# Patient Record
Sex: Female | Born: 1935
Health system: Southern US, Community
[De-identification: ages and names within clinical notes are randomized; demographics above are authoritative.]

## PROBLEM LIST (undated history)

## (undated) ENCOUNTER — Ambulatory Visit: Admission: EM | Payer: Medicare Other

## (undated) DIAGNOSIS — I5032 Chronic diastolic (congestive) heart failure: Secondary | ICD-10-CM

## (undated) DIAGNOSIS — I779 Disorder of arteries and arterioles, unspecified: Secondary | ICD-10-CM

## (undated) DIAGNOSIS — I25119 Atherosclerotic heart disease of native coronary artery with unspecified angina pectoris: Secondary | ICD-10-CM

## (undated) DIAGNOSIS — G629 Polyneuropathy, unspecified: Secondary | ICD-10-CM

## (undated) DIAGNOSIS — E1151 Type 2 diabetes mellitus with diabetic peripheral angiopathy without gangrene: Secondary | ICD-10-CM

## (undated) DIAGNOSIS — R001 Bradycardia, unspecified: Secondary | ICD-10-CM

## (undated) DIAGNOSIS — I10 Essential (primary) hypertension: Secondary | ICD-10-CM

## (undated) DIAGNOSIS — K219 Gastro-esophageal reflux disease without esophagitis: Secondary | ICD-10-CM

## (undated) DIAGNOSIS — K279 Peptic ulcer, site unspecified, unspecified as acute or chronic, without hemorrhage or perforation: Secondary | ICD-10-CM

## (undated) DIAGNOSIS — I739 Peripheral vascular disease, unspecified: Secondary | ICD-10-CM

## (undated) DIAGNOSIS — Z951 Presence of aortocoronary bypass graft: Secondary | ICD-10-CM

## (undated) DIAGNOSIS — I253 Aneurysm of heart: Secondary | ICD-10-CM

## (undated) DIAGNOSIS — T50995A Adverse effect of other drugs, medicaments and biological substances, initial encounter: Secondary | ICD-10-CM

## (undated) DIAGNOSIS — D649 Anemia, unspecified: Secondary | ICD-10-CM

## (undated) DIAGNOSIS — M069 Rheumatoid arthritis, unspecified: Secondary | ICD-10-CM

## (undated) DIAGNOSIS — E785 Hyperlipidemia, unspecified: Secondary | ICD-10-CM

## (undated) HISTORY — DX: Hyperlipidemia, unspecified: E78.5

## (undated) HISTORY — PX: BACK SURGERY: SHX140

## (undated) HISTORY — DX: Presence of aortocoronary bypass graft: Z95.1

## (undated) HISTORY — DX: Peripheral vascular disease, unspecified: I73.9

## (undated) HISTORY — DX: Chronic diastolic (congestive) heart failure: I50.32

## (undated) HISTORY — DX: Disorder of arteries and arterioles, unspecified: I77.9

## (undated) HISTORY — DX: Rheumatoid arthritis, unspecified: M06.9

## (undated) HISTORY — DX: Polyneuropathy, unspecified: G62.9

## (undated) HISTORY — DX: Peptic ulcer, site unspecified, unspecified as acute or chronic, without hemorrhage or perforation: K27.9

## (undated) HISTORY — DX: Type 2 diabetes mellitus with diabetic peripheral angiopathy without gangrene: E11.51

## (undated) HISTORY — DX: Anemia, unspecified: D64.9

## (undated) HISTORY — PX: TUBAL LIGATION: SHX77

## (undated) HISTORY — DX: Essential (primary) hypertension: I10

## (undated) HISTORY — DX: Atherosclerotic heart disease of native coronary artery with unspecified angina pectoris: I25.119

## (undated) HISTORY — PX: TRANSTHORACIC ECHOCARDIOGRAM: SHX275

## (undated) HISTORY — PX: NM MYOVIEW LTD: HXRAD82

---

## 1990-12-13 DIAGNOSIS — I25119 Atherosclerotic heart disease of native coronary artery with unspecified angina pectoris: Secondary | ICD-10-CM

## 1990-12-13 HISTORY — PX: CORONARY ANGIOPLASTY: SHX604

## 1990-12-13 HISTORY — DX: Atherosclerotic heart disease of native coronary artery with unspecified angina pectoris: I25.119

## 1996-12-13 DIAGNOSIS — Z951 Presence of aortocoronary bypass graft: Secondary | ICD-10-CM

## 1996-12-13 HISTORY — DX: Presence of aortocoronary bypass graft: Z95.1

## 1996-12-13 HISTORY — PX: CORONARY ARTERY BYPASS GRAFT: SHX141

## 1998-03-13 ENCOUNTER — Encounter (HOSPITAL_COMMUNITY): Admission: RE | Admit: 1998-03-13 | Discharge: 1998-06-11 | Payer: Self-pay | Admitting: Cardiology

## 1998-07-17 ENCOUNTER — Encounter: Admission: RE | Admit: 1998-07-17 | Discharge: 1998-10-15 | Payer: Self-pay | Admitting: Specialist

## 1998-10-01 ENCOUNTER — Other Ambulatory Visit: Admission: RE | Admit: 1998-10-01 | Discharge: 1998-10-01 | Payer: Self-pay | Admitting: Endocrinology

## 1999-07-22 ENCOUNTER — Emergency Department (HOSPITAL_COMMUNITY): Admission: EM | Admit: 1999-07-22 | Discharge: 1999-07-22 | Payer: Self-pay | Admitting: Emergency Medicine

## 1999-09-09 ENCOUNTER — Encounter: Admission: RE | Admit: 1999-09-09 | Discharge: 1999-12-08 | Payer: Self-pay | Admitting: Internal Medicine

## 1999-11-03 ENCOUNTER — Other Ambulatory Visit: Admission: RE | Admit: 1999-11-03 | Discharge: 1999-11-03 | Payer: Self-pay | Admitting: Endocrinology

## 1999-12-10 ENCOUNTER — Encounter (INDEPENDENT_AMBULATORY_CARE_PROVIDER_SITE_OTHER): Payer: Self-pay | Admitting: *Deleted

## 1999-12-10 ENCOUNTER — Ambulatory Visit (HOSPITAL_COMMUNITY): Admission: RE | Admit: 1999-12-10 | Discharge: 1999-12-10 | Payer: Self-pay | Admitting: *Deleted

## 2000-02-05 ENCOUNTER — Encounter: Payer: Self-pay | Admitting: Specialist

## 2000-02-05 ENCOUNTER — Ambulatory Visit (HOSPITAL_COMMUNITY): Admission: RE | Admit: 2000-02-05 | Discharge: 2000-02-05 | Payer: Self-pay | Admitting: Specialist

## 2000-02-19 ENCOUNTER — Ambulatory Visit (HOSPITAL_COMMUNITY): Admission: RE | Admit: 2000-02-19 | Discharge: 2000-02-19 | Payer: Self-pay | Admitting: Specialist

## 2000-02-19 ENCOUNTER — Encounter: Payer: Self-pay | Admitting: Specialist

## 2000-03-08 ENCOUNTER — Encounter: Payer: Self-pay | Admitting: Specialist

## 2000-03-08 ENCOUNTER — Ambulatory Visit (HOSPITAL_COMMUNITY): Admission: RE | Admit: 2000-03-08 | Discharge: 2000-03-08 | Payer: Self-pay | Admitting: Specialist

## 2000-04-08 ENCOUNTER — Encounter: Admission: RE | Admit: 2000-04-08 | Discharge: 2000-05-24 | Payer: Self-pay | Admitting: Specialist

## 2000-06-16 ENCOUNTER — Encounter: Payer: Self-pay | Admitting: Endocrinology

## 2000-06-16 ENCOUNTER — Encounter: Admission: RE | Admit: 2000-06-16 | Discharge: 2000-06-16 | Payer: Self-pay | Admitting: Endocrinology

## 2000-09-21 ENCOUNTER — Encounter: Payer: Self-pay | Admitting: Endocrinology

## 2000-09-21 ENCOUNTER — Ambulatory Visit (HOSPITAL_COMMUNITY): Admission: RE | Admit: 2000-09-21 | Discharge: 2000-09-21 | Payer: Self-pay | Admitting: Endocrinology

## 2000-10-14 ENCOUNTER — Ambulatory Visit (HOSPITAL_COMMUNITY): Admission: RE | Admit: 2000-10-14 | Discharge: 2000-10-14 | Payer: Self-pay | Admitting: *Deleted

## 2000-10-14 ENCOUNTER — Encounter (INDEPENDENT_AMBULATORY_CARE_PROVIDER_SITE_OTHER): Payer: Self-pay | Admitting: Specialist

## 2000-10-29 ENCOUNTER — Emergency Department (HOSPITAL_COMMUNITY): Admission: EM | Admit: 2000-10-29 | Discharge: 2000-10-29 | Payer: Self-pay | Admitting: Emergency Medicine

## 2000-10-29 ENCOUNTER — Encounter: Payer: Self-pay | Admitting: Emergency Medicine

## 2000-11-07 ENCOUNTER — Encounter: Admission: RE | Admit: 2000-11-07 | Discharge: 2001-02-05 | Payer: Self-pay | Admitting: Cardiology

## 2000-12-02 ENCOUNTER — Ambulatory Visit (HOSPITAL_COMMUNITY): Admission: RE | Admit: 2000-12-02 | Discharge: 2000-12-02 | Payer: Self-pay | Admitting: *Deleted

## 2000-12-02 ENCOUNTER — Encounter: Payer: Self-pay | Admitting: *Deleted

## 2000-12-19 ENCOUNTER — Ambulatory Visit (HOSPITAL_COMMUNITY): Admission: RE | Admit: 2000-12-19 | Discharge: 2000-12-19 | Payer: Self-pay | Admitting: Endocrinology

## 2000-12-27 ENCOUNTER — Emergency Department (HOSPITAL_COMMUNITY): Admission: EM | Admit: 2000-12-27 | Discharge: 2000-12-27 | Payer: Self-pay | Admitting: Emergency Medicine

## 2000-12-30 ENCOUNTER — Ambulatory Visit (HOSPITAL_COMMUNITY): Admission: RE | Admit: 2000-12-30 | Discharge: 2000-12-30 | Payer: Self-pay | Admitting: Endocrinology

## 2001-01-09 ENCOUNTER — Encounter: Payer: Self-pay | Admitting: Endocrinology

## 2001-01-09 ENCOUNTER — Ambulatory Visit (HOSPITAL_COMMUNITY): Admission: RE | Admit: 2001-01-09 | Discharge: 2001-01-09 | Payer: Self-pay | Admitting: Endocrinology

## 2001-01-31 ENCOUNTER — Ambulatory Visit (HOSPITAL_COMMUNITY): Admission: RE | Admit: 2001-01-31 | Discharge: 2001-01-31 | Payer: Self-pay | Admitting: Specialist

## 2001-07-17 ENCOUNTER — Ambulatory Visit (HOSPITAL_COMMUNITY): Admission: RE | Admit: 2001-07-17 | Discharge: 2001-07-17 | Payer: Self-pay | Admitting: Cardiology

## 2001-07-17 HISTORY — PX: CARDIAC CATHETERIZATION: SHX172

## 2001-07-31 ENCOUNTER — Emergency Department (HOSPITAL_COMMUNITY): Admission: EM | Admit: 2001-07-31 | Discharge: 2001-07-31 | Payer: Self-pay | Admitting: Emergency Medicine

## 2001-08-28 ENCOUNTER — Encounter: Admission: RE | Admit: 2001-08-28 | Discharge: 2001-08-28 | Payer: Self-pay | Admitting: Neurosurgery

## 2001-08-30 ENCOUNTER — Encounter: Admission: RE | Admit: 2001-08-30 | Discharge: 2001-08-30 | Payer: Self-pay | Admitting: Neurosurgery

## 2001-08-30 ENCOUNTER — Encounter: Payer: Self-pay | Admitting: Neurosurgery

## 2002-01-31 ENCOUNTER — Encounter: Payer: Self-pay | Admitting: Specialist

## 2002-02-07 ENCOUNTER — Encounter: Payer: Self-pay | Admitting: Specialist

## 2002-02-07 ENCOUNTER — Inpatient Hospital Stay (HOSPITAL_COMMUNITY): Admission: RE | Admit: 2002-02-07 | Discharge: 2002-02-12 | Payer: Self-pay | Admitting: Specialist

## 2002-02-10 ENCOUNTER — Encounter: Payer: Self-pay | Admitting: Specialist

## 2002-02-12 ENCOUNTER — Inpatient Hospital Stay (HOSPITAL_COMMUNITY)
Admission: RE | Admit: 2002-02-12 | Discharge: 2002-02-21 | Payer: Self-pay | Admitting: Physical Medicine & Rehabilitation

## 2002-08-03 ENCOUNTER — Encounter: Payer: Self-pay | Admitting: Endocrinology

## 2002-08-03 ENCOUNTER — Encounter: Admission: RE | Admit: 2002-08-03 | Discharge: 2002-08-03 | Payer: Self-pay | Admitting: Endocrinology

## 2002-08-22 ENCOUNTER — Encounter: Payer: Self-pay | Admitting: Endocrinology

## 2002-08-22 ENCOUNTER — Ambulatory Visit (HOSPITAL_COMMUNITY): Admission: RE | Admit: 2002-08-22 | Discharge: 2002-08-22 | Payer: Self-pay | Admitting: Endocrinology

## 2004-01-02 ENCOUNTER — Ambulatory Visit (HOSPITAL_COMMUNITY): Admission: RE | Admit: 2004-01-02 | Discharge: 2004-01-02 | Payer: Self-pay | Admitting: Cardiology

## 2004-04-09 ENCOUNTER — Emergency Department (HOSPITAL_COMMUNITY): Admission: EM | Admit: 2004-04-09 | Discharge: 2004-04-09 | Payer: Self-pay

## 2004-04-09 ENCOUNTER — Encounter: Admission: RE | Admit: 2004-04-09 | Discharge: 2004-04-09 | Payer: Self-pay | Admitting: Endocrinology

## 2004-10-02 ENCOUNTER — Encounter: Admission: RE | Admit: 2004-10-02 | Discharge: 2004-10-02 | Payer: Self-pay | Admitting: Neurology

## 2004-10-27 ENCOUNTER — Encounter (INDEPENDENT_AMBULATORY_CARE_PROVIDER_SITE_OTHER): Payer: Self-pay | Admitting: *Deleted

## 2004-10-27 ENCOUNTER — Ambulatory Visit (HOSPITAL_COMMUNITY): Admission: RE | Admit: 2004-10-27 | Discharge: 2004-10-27 | Payer: Self-pay | Admitting: Otolaryngology

## 2005-06-25 ENCOUNTER — Ambulatory Visit (HOSPITAL_COMMUNITY): Admission: RE | Admit: 2005-06-25 | Discharge: 2005-06-25 | Payer: Self-pay | Admitting: *Deleted

## 2005-06-25 ENCOUNTER — Encounter (INDEPENDENT_AMBULATORY_CARE_PROVIDER_SITE_OTHER): Payer: Self-pay | Admitting: *Deleted

## 2005-06-25 ENCOUNTER — Encounter (INDEPENDENT_AMBULATORY_CARE_PROVIDER_SITE_OTHER): Payer: Self-pay | Admitting: Specialist

## 2005-12-25 ENCOUNTER — Emergency Department (HOSPITAL_COMMUNITY): Admission: EM | Admit: 2005-12-25 | Discharge: 2005-12-25 | Payer: Self-pay | Admitting: Emergency Medicine

## 2006-03-16 ENCOUNTER — Emergency Department (HOSPITAL_COMMUNITY): Admission: EM | Admit: 2006-03-16 | Discharge: 2006-03-16 | Payer: Self-pay | Admitting: Emergency Medicine

## 2006-09-25 ENCOUNTER — Encounter: Admission: RE | Admit: 2006-09-25 | Discharge: 2006-09-25 | Payer: Self-pay | Admitting: Endocrinology

## 2007-09-04 ENCOUNTER — Encounter: Admission: RE | Admit: 2007-09-04 | Discharge: 2007-09-04 | Payer: Self-pay | Admitting: Endocrinology

## 2007-09-04 ENCOUNTER — Encounter (INDEPENDENT_AMBULATORY_CARE_PROVIDER_SITE_OTHER): Payer: Self-pay | Admitting: *Deleted

## 2007-09-27 ENCOUNTER — Encounter (INDEPENDENT_AMBULATORY_CARE_PROVIDER_SITE_OTHER): Payer: Self-pay | Admitting: *Deleted

## 2007-09-27 ENCOUNTER — Ambulatory Visit (HOSPITAL_COMMUNITY): Admission: RE | Admit: 2007-09-27 | Discharge: 2007-09-27 | Payer: Self-pay | Admitting: *Deleted

## 2007-12-08 ENCOUNTER — Encounter (INDEPENDENT_AMBULATORY_CARE_PROVIDER_SITE_OTHER): Payer: Self-pay | Admitting: *Deleted

## 2007-12-08 ENCOUNTER — Encounter: Admission: RE | Admit: 2007-12-08 | Discharge: 2007-12-08 | Payer: Self-pay | Admitting: Endocrinology

## 2008-07-16 ENCOUNTER — Emergency Department (HOSPITAL_COMMUNITY): Admission: EM | Admit: 2008-07-16 | Discharge: 2008-07-16 | Payer: Self-pay | Admitting: Emergency Medicine

## 2008-12-20 ENCOUNTER — Ambulatory Visit (HOSPITAL_COMMUNITY): Admission: RE | Admit: 2008-12-20 | Discharge: 2008-12-20 | Payer: Self-pay | Admitting: *Deleted

## 2008-12-20 ENCOUNTER — Encounter (INDEPENDENT_AMBULATORY_CARE_PROVIDER_SITE_OTHER): Payer: Self-pay | Admitting: *Deleted

## 2009-02-15 ENCOUNTER — Emergency Department (HOSPITAL_COMMUNITY): Admission: EM | Admit: 2009-02-15 | Discharge: 2009-02-15 | Payer: Self-pay | Admitting: Emergency Medicine

## 2009-02-21 ENCOUNTER — Encounter: Admission: RE | Admit: 2009-02-21 | Discharge: 2009-02-21 | Payer: Self-pay | Admitting: Rheumatology

## 2009-03-28 ENCOUNTER — Encounter (INDEPENDENT_AMBULATORY_CARE_PROVIDER_SITE_OTHER): Payer: Self-pay | Admitting: *Deleted

## 2009-03-28 ENCOUNTER — Ambulatory Visit (HOSPITAL_COMMUNITY): Admission: RE | Admit: 2009-03-28 | Discharge: 2009-03-28 | Payer: Self-pay | Admitting: *Deleted

## 2009-06-06 ENCOUNTER — Ambulatory Visit (HOSPITAL_COMMUNITY): Admission: RE | Admit: 2009-06-06 | Discharge: 2009-06-06 | Payer: Self-pay | Admitting: Orthopedic Surgery

## 2009-10-09 ENCOUNTER — Encounter: Payer: Self-pay | Admitting: Gastroenterology

## 2009-12-18 ENCOUNTER — Encounter: Payer: Self-pay | Admitting: Gastroenterology

## 2009-12-22 ENCOUNTER — Encounter: Payer: Self-pay | Admitting: Gastroenterology

## 2009-12-22 ENCOUNTER — Encounter (INDEPENDENT_AMBULATORY_CARE_PROVIDER_SITE_OTHER): Payer: Self-pay | Admitting: *Deleted

## 2010-01-14 ENCOUNTER — Ambulatory Visit: Payer: Self-pay | Admitting: Gastroenterology

## 2010-01-14 DIAGNOSIS — Z860100 Personal history of colon polyps, unspecified: Secondary | ICD-10-CM | POA: Insufficient documentation

## 2010-01-14 DIAGNOSIS — Z91013 Allergy to seafood: Secondary | ICD-10-CM

## 2010-01-14 DIAGNOSIS — D638 Anemia in other chronic diseases classified elsewhere: Secondary | ICD-10-CM | POA: Insufficient documentation

## 2010-01-14 DIAGNOSIS — I25119 Atherosclerotic heart disease of native coronary artery with unspecified angina pectoris: Secondary | ICD-10-CM | POA: Insufficient documentation

## 2010-01-14 DIAGNOSIS — E119 Type 2 diabetes mellitus without complications: Secondary | ICD-10-CM | POA: Insufficient documentation

## 2010-01-14 DIAGNOSIS — I2510 Atherosclerotic heart disease of native coronary artery without angina pectoris: Secondary | ICD-10-CM | POA: Insufficient documentation

## 2010-01-14 DIAGNOSIS — K5730 Diverticulosis of large intestine without perforation or abscess without bleeding: Secondary | ICD-10-CM | POA: Insufficient documentation

## 2010-01-14 HISTORY — DX: Diverticulosis of large intestine without perforation or abscess without bleeding: K57.30

## 2010-01-14 HISTORY — DX: Atherosclerotic heart disease of native coronary artery with unspecified angina pectoris: I25.119

## 2010-01-14 HISTORY — DX: Allergy to seafood: Z91.013

## 2010-01-16 ENCOUNTER — Ambulatory Visit: Payer: Self-pay | Admitting: Gastroenterology

## 2010-01-20 ENCOUNTER — Encounter: Payer: Self-pay | Admitting: Gastroenterology

## 2010-02-04 ENCOUNTER — Ambulatory Visit: Payer: Self-pay | Admitting: Gastroenterology

## 2010-02-04 LAB — CONVERTED CEMR LAB
Fecal Occult Blood: NEGATIVE
OCCULT 1: NEGATIVE
OCCULT 2: NEGATIVE
OCCULT 3: NEGATIVE
OCCULT 4: NEGATIVE
OCCULT 5: NEGATIVE

## 2010-02-05 ENCOUNTER — Encounter: Payer: Self-pay | Admitting: Gastroenterology

## 2010-03-03 ENCOUNTER — Telehealth: Payer: Self-pay | Admitting: Gastroenterology

## 2010-03-04 ENCOUNTER — Ambulatory Visit: Payer: Self-pay | Admitting: Gastroenterology

## 2010-03-04 DIAGNOSIS — K2270 Barrett's esophagus without dysplasia: Secondary | ICD-10-CM

## 2010-03-04 DIAGNOSIS — I6350 Cerebral infarction due to unspecified occlusion or stenosis of unspecified cerebral artery: Secondary | ICD-10-CM | POA: Insufficient documentation

## 2010-03-04 DIAGNOSIS — R159 Full incontinence of feces: Secondary | ICD-10-CM | POA: Insufficient documentation

## 2010-03-04 HISTORY — DX: Barrett's esophagus without dysplasia: K22.70

## 2010-04-13 ENCOUNTER — Ambulatory Visit: Payer: Self-pay | Admitting: Gastroenterology

## 2010-04-17 ENCOUNTER — Emergency Department (HOSPITAL_COMMUNITY): Admission: EM | Admit: 2010-04-17 | Discharge: 2010-04-18 | Payer: Self-pay | Admitting: Emergency Medicine

## 2010-10-07 ENCOUNTER — Ambulatory Visit: Payer: Self-pay | Admitting: Gastroenterology

## 2010-10-07 DIAGNOSIS — R1031 Right lower quadrant pain: Secondary | ICD-10-CM | POA: Insufficient documentation

## 2010-10-07 LAB — CONVERTED CEMR LAB
Bilirubin Urine: NEGATIVE
Hemoglobin, Urine: NEGATIVE
Ketones, ur: NEGATIVE mg/dL
Leukocytes, UA: NEGATIVE
Nitrite: NEGATIVE
Specific Gravity, Urine: >=1.03 (ref 1.000–1.030)
Total Protein, Urine: NEGATIVE mg/dL
Urine Glucose: NEGATIVE mg/dL
Urobilinogen, UA: 0.2 (ref 0.0–1.0)
pH: 5.5 (ref 5.0–8.0)

## 2010-11-09 ENCOUNTER — Ambulatory Visit: Payer: Self-pay | Admitting: Gastroenterology

## 2010-12-28 ENCOUNTER — Ambulatory Visit
Admission: RE | Admit: 2010-12-28 | Discharge: 2010-12-28 | Payer: Self-pay | Source: Home / Self Care | Attending: Gastroenterology | Admitting: Gastroenterology

## 2011-01-14 NOTE — Assessment & Plan Note (Signed)
Summary: F/U Rectal pain/Incontinence, saw PA   History of Present Illness Visit Type: Follow-up Visit Primary GI MD: Melvia Heaps MD [facility] Primary Provider: Darci Needle, MD Chief Complaint: follow-up rectal pain/incontinence History of Present Illness:   Mrs. Demarinis has returned for followup of her stool incontinence.  On a regimen of anusol HC suppositories and fiber supplementation her symptoms are significantly improved.  Incontinence is rare.  Rectal soreness has improved though it remains.   GI Review of Systems    Reports acid reflux and  belching.      Denies abdominal pain, bloating, chest pain, dysphagia with liquids, dysphagia with solids, heartburn, loss of appetite, nausea, vomiting, vomiting blood, weight loss, and  weight gain.      Reports change in bowel habits, diarrhea, fecal incontinence, and  rectal pain.     Denies anal fissure, black tarry stools, constipation, diverticulosis, heme positive stool, hemorrhoids, irritable bowel syndrome, jaundice, light color stool, liver problems, and  rectal bleeding.    Current Medications (verified): 1)  Zetia 10 Mg Tabs (Ezetimibe) .... Take 1 Tablet By Mouth Once A Day 2)  Metoprolol Tartrate 100 Mg Tabs (Metoprolol Tartrate) .... Take 1 Tablet By Mouth Once A Day 3)  Estradiol 1 Mg Tabs (Estradiol) .... Take 1 Tablet By Mouth Two Times A Day 4)  Exforge 5-320 Mg Tabs (Amlodipine Besylate-Valsartan) .... Take 1 Tablet By Mouth Once A Day 5)  Actos 45 Mg Tabs (Pioglitazone Hcl) .... Take 1 Tablet By Mouth Once A Day 6)  Plavix 75 Mg Tabs (Clopidogrel Bisulfate) .... Take 1 Tablet By Mouth Once A Day 7)  Crestor 20 Mg Tabs (Rosuvastatin Calcium) .... Take 1 Tablet By Mouth Once A Day 8)  Centrum  Tabs (Multiple Vitamins-Minerals) .... Take 1 Tablet By Mouth Once A Day 9)  Vitamin C 1000 Mg Tabs (Ascorbic Acid) .... Take 1 Tablet By Mouth Once A Day 10)  Glimepiride 4 Mg Tabs (Glimepiride) .... Once Daily 11)  Vitamin D 400 Unit  Tabs (Cholecalciferol) .... Once Daily 12)  Bentyl 10 Mg Caps (Dicyclomine Hcl) .... Take 1 Tab Twice Daily For Abdominal Cramps and Spasms 13)  Prilosec 20 Mg Cpdr (Omeprazole) .... Take 1 Capsule 30 Min Before Breakfast 14)  Anusol-Hc 25 Mg Supp (Hydrocortisone Acetate) .... Use 1 Suppository Rectally At Bedtime  Allergies (verified): 1)  ! * Ivp Dye 2)  ! * Latex 3)  * Shellfish  Past History:  Past Medical History: Reviewed history from 03/04/2010 and no changes required. Anemia BARRETTS ESOPHAGUS GERD HX PUD/ANTRAL ULCER JAN 2010 Arthritis Hyperlipidemia Hypertension Diverticulosis Diabetes Mellitus Type II Hx of Colonic Polyps/NEGATIVE COLON 2006(DR. ORR)  Past Surgical History: Reviewed history from 03/04/2010 and no changes required. CABG Angioplasty Hernioggraphy Partial Hysterectomy Bladder Surgery Cataract Extraction-Bilateral  Family History: Reviewed history from 01/14/2010 and no changes required. Patient adopted   Social History: Reviewed history from 03/04/2010 and no changes required. Patient has never smoked.  Alcohol Use - no Illicit Drug Use - no Occupation: Retired  Review of Systems       The patient complains of arthritis/joint pain, back pain, cough, shortness of breath, swelling of feet/legs, urination - excessive, urination changes/pain, and voice change.  The patient denies allergy/sinus, anemia, anxiety-new, blood in urine, breast changes/lumps, change in vision, confusion, coughing up blood, depression-new, fainting, fatigue, fever, headaches-new, hearing problems, heart murmur, heart rhythm changes, itching, muscle pains/cramps, night sweats, nosebleeds, skin rash, sleeping problems, sore throat, swollen lymph glands, thirst - excessive,  urine leakage, and vision changes.    Vital Signs:  Patient profile:   77 year old female Height:      63 inches Weight:      185 pounds BMI:     32.89 Pulse rate:   68 / minute Pulse rhythm:    regular BP sitting:   106 / 70  (left arm)  Vitals Entered By: Milford Cage NCMA (Apr 13, 2010 10:37 AM)  Physical Exam  Additional Exam:  On inspection of the rectum there were no external abnormalities.   Impression & Recommendations:  Problem # 1:  FECAL INCONTINENCE (ICD-787.6) Assessment Improved Plan to continue fiber supplementation.  There may be a component of hemorrhoidal disease that is contributing to her symptoms.  This is improved.  She was instructed to use suppositories as needed.  Problem # 2:  BARRETT'S ESOPHAGUS (ICD-530.85)  Patient Instructions: 1)  The medication list was reviewed and reconciled.  All changed / newly prescribed medications were explained.  A complete medication list was provided to the patient / caregiver. 2)  CC Dr.Kohut

## 2011-01-14 NOTE — Op Note (Signed)
Summary: Flexible Sigmoidoscopy  (Dr Virginia Rochester)  NAME:  [name], [name]                  ACCOUNT NO.:  [id number]      MEDICAL RECORD NO.:  [id number]          PATIENT TYPE:  AMB      LOCATION:  ENDO                         FACILITY:  [facility]      PHYSICIAN:  Georgiana Spinner, M.D.    DATE OF BIRTH:  December 13, 1933      DATE OF PROCEDURE:   DATE OF DISCHARGE:                                  OPERATIVE REPORT      PROCEDURE:  Flexible sigmoidoscopy.      INDICATIONS:  Incontinence of stool with diarrhea and abdominal pain.      ANESTHESIA:  None given.      PROCEDURE IN DETAIL:  With the patient mildly sedated in the left   lateral decubitus position, the Pentax videoscopic colonoscope was   inserted in the rectum, passed under direct vision to approximately 44   cm from anal verge at which point the patient became uncomfortable, so I   stopped the passage of the scope that point.  From this point, the   colonoscope was slowly withdrawn, taking circumferential views of   colonic mucosa stopping only in the rectum which appeared normal   indirect, showed hemorrhoids on retroflexed view.  The endoscope was   straightened and withdrawn.  The patient's vital signs, pulse oximeter   remained stable.  The patient tolerated procedure well without apparent   complications.      FINDINGS:  Diverticulosis of sigmoid colon.  Internal hemorrhoids.  Of   note, the rectal sphincter was somewhat lax and once again encouraged   her to perform Kegel exercises and follow up with me as an outpatient in   approximately 6 weeks.                  ______________________________   Georgiana Spinner, M.D.            GMO/MEDQ  D:  09/27/2007  T:  09/27/2007  Job:  [id number]

## 2011-01-14 NOTE — Letter (Signed)
Summary: EGD Instructions  [facility]  [address] [place], [place] [address]   Phone: [phone]  Fax: [phone]       [name]    1933/05/05    MRN: [id number]       Procedure Day /Date:FRIDAY 01/16/2010     Arrival Time: 10:30AM     Procedure Time:11:30AM     Location of Procedure:                    X  [facility] (4th Floor)   PREPARATION FOR ENDOSCOPY   On 01/16/2010  THE DAY OF THE PROCEDURE:  1.   No solid foods, milk or milk products are allowed after midnight the night before your procedure.  2.   Do not drink anything colored red or purple.  Avoid juices with pulp.  No orange juice.  3.  You may drink clear liquids until 9:30AM , which is 2 hours before your procedure.                                                                                                CLEAR LIQUIDS INCLUDE: Water Jello Ice Popsicles Tea (sugar ok, no milk/cream) Powdered fruit flavored drinks Coffee (sugar ok, no milk/cream) Gatorade Juice: apple, white grape, white cranberry  Lemonade Clear bullion, consomm, broth Carbonated beverages (any kind) Strained chicken noodle soup Hard Candy   MEDICATION INSTRUCTIONS  Unless otherwise instructed, you should take regular prescription medications with a small sip of water as early as possible the morning of your procedure.  Diabetic patients - see separate instructions. ACTOS  Stop taking Plavix STAY ON PLAVIX PER DR Dois Juarbe           OTHER INSTRUCTIONS  You will need a responsible adult at least 75 years of age to accompany you and drive you home.   This person must remain in the waiting room during your procedure.  Wear loose fitting clothing that is easily removed.  Leave jewelry and other valuables at home.  However, you may wish to bring a book to read or an iPod/MP3 player to listen to music as you wait for your procedure to start.  Remove all body piercing jewelry and leave at  home.  Total time from sign-in until discharge is approximately 2-3 hours.  You should go home directly after your procedure and rest.  You can resume normal activities the day after your procedure.  The day of your procedure you should not:   Drive   Make legal decisions   Operate machinery   Drink alcohol   Return to work  You will receive specific instructions about eating, activities and medications before you leave.    The above instructions have been reviewed and explained to me by   _______________________    I fully understand and can verbalize these instructions _____________________________ Date _________

## 2011-01-14 NOTE — Letter (Signed)
Summary: [facility] Instructions  [facility]  [address] [place], [place] [address]   Phone: [phone]  Fax: [phone]       [name]    Apr 02, 1933    MRN: [id number]        Procedure Day /Date:MONDAY 11/09/2010     Arrival Time:9:30AM     Procedure Time:10:30AM     Location of Procedure:                    X  [facility] (4th Floor)                       PREPARATION FOR COLONOSCOPY WITH MOVIPREP   Starting 5 days prior to your procedure11/23/2011 do not eat nuts, seeds, popcorn, corn, beans, peas,  salads, or any raw vegetables.  Do not take any fiber supplements (e.g. Metamucil, Citrucel, and Benefiber).  THE DAY BEFORE YOUR PROCEDURE         DATE: 11/08/2010  MWU:[id number]  1.  Drink clear liquids the entire day-NO SOLID FOOD  2.  Do not drink anything colored red or purple.  Avoid juices with pulp.  No orange juice.  3.  Drink at least 64 oz. (8 glasses) of fluid/clear liquids during the day to prevent dehydration and help the prep work efficiently.  CLEAR LIQUIDS INCLUDE: Water Jello Ice Popsicles Tea (sugar ok, no milk/cream) Powdered fruit flavored drinks Coffee (sugar ok, no milk/cream) Gatorade Juice: apple, white grape, white cranberry  Lemonade Clear bullion, consomm, broth Carbonated beverages (any kind) Strained chicken noodle soup Hard Candy                             4.  In the morning, mix first dose of MoviPrep solution:    Empty 1 Pouch A and 1 Pouch B into the disposable container    Add lukewarm drinking water to the top line of the container. Mix to dissolve    Refrigerate (mixed solution should be used within 24 hrs)  5.  Begin drinking the prep at 5:00 p.m. The MoviPrep container is divided by 4 marks.   Every 15 minutes drink the solution down to the next mark (approximately 8 oz) until the full liter is complete.   6.  Follow completed prep with 16 oz of clear liquid of your choice (Nothing red or  purple).  Continue to drink clear liquids until bedtime.  7.  Before going to bed, mix second dose of MoviPrep solution:    Empty 1 Pouch A and 1 Pouch B into the disposable container    Add lukewarm drinking water to the top line of the container. Mix to dissolve    Refrigerate  THE DAY OF YOUR PROCEDURE      DATE: 11/09/2010 DAY: MONDAY  Beginning at 5:30AMa.m. (5 hours before procedure):         1. Every 15 minutes, drink the solution down to the next mark (approx 8 oz) until the full liter is complete.  2. Follow completed prep with 16 oz. of clear liquid of your choice.    3. You may drink clear liquids until 8:30AM (2 HOURS BEFORE PROCEDURE).   MEDICATION INSTRUCTIONS  Unless otherwise instructed, you should take regular prescription medications with a small sip of water   as early as possible the morning of your procedure.   Stop taking Plavix  11/21  (7  days before procedure).   PENDING  You will be contacted by our office prior to your procedure for directions on holding your Plavix.  If you do not hear from our office 1 week prior to your scheduled procedure, please call [phone] to discuss.          OTHER INSTRUCTIONS  You will need a responsible adult at least 75 years of age to accompany you and drive you home.   This person must remain in the waiting room during your procedure.  Wear loose fitting clothing that is easily removed.  Leave jewelry and other valuables at home.  However, you may wish to bring a book to read or  an iPod/MP3 player to listen to music as you wait for your procedure to start.  Remove all body piercing jewelry and leave at home.  Total time from sign-in until discharge is approximately 2-3 hours.  You should go home directly after your procedure and rest.  You can resume normal activities the  day after your procedure.  The day of your procedure you should not:   Drive   Make legal decisions   Operate machinery    Drink alcohol   Return to work  You will receive specific instructions about eating, activities and medications before you leave.    The above instructions have been reviewed and explained to me by   _______________________    I fully understand and can verbalize these instructions _____________________________ Date _________

## 2011-01-14 NOTE — Procedures (Signed)
Summary: Colonoscopy  Patient: [name] Note: All result statuses are Final unless otherwise noted.  Tests: (1) Colonoscopy (COL)   COL Colonoscopy           DONE     [facility]     [address]     [place], [place]  [address]           COLONOSCOPY PROCEDURE REPORT           PATIENT:  Monica, Flores  MR#:  [id number]     BIRTHDATE:  1933-03-11, 76 yrs. old  GENDER:  female           ENDOSCOPIST:  Barbette Hair. Arlyce Dice, MD     Referred by:           PROCEDURE DATE:  11/09/2010     PROCEDURE:  Diagnostic Colonoscopy     ASA CLASS:  Class II     INDICATIONS:  1) Abdominal pain  2) history of pre-cancerous     (adenomatous) colon polyps           MEDICATIONS:   Fentanyl 75 mcg IV, Versed 8 mg IV           DESCRIPTION OF PROCEDURE:   After the risks benefits and     alternatives of the procedure were thoroughly explained, informed     consent was obtained.  Digital rectal exam was performed and     revealed no abnormalities.   The LB CF-[id number] endoscope     was introduced through the anus and advanced to the cecum, which     was identified by both the appendix and ileocecal valve, without     limitations.  The quality of the prep was excellent, using     MoviPrep.  The instrument was then slowly withdrawn as the colon     was fully examined.     <<PROCEDUREIMAGES>>           FINDINGS:  Scattered diverticula were found in the ascending colon     (see image4).  Mild diverticulosis was found in the sigmoid colon     (see image8).  This was otherwise a normal examination of the     colon (see image1, image3, image5, image6, image7, and image9).     Retroflexed views in the rectum revealed no abnormalities.    The     time to cecum =  1.25  minutes. The scope was then withdrawn (time     =  6.25  min) from the patient and the procedure completed.           COMPLICATIONS:  None           ENDOSCOPIC IMPRESSION:     1) Diverticula, scattered in the ascending colon   2) Mild diverticulosis in the sigmoid colon     3) Otherwise normal examination     RECOMMENDATIONS:     1) Continue current medications - bentyl for abdominal pain     2) call office next 1-3 days to schedule followup visit in 1     month     3) Colonoscopy 10 years           REPEAT EXAM:   10 year(s) Colonoscopy           ______________________________     Barbette Hair. Arlyce Dice, MD           CC: Adela Lank, MD  n.     eSIGNED:   Barbette Hair. Kaplan at 11/09/2010 11:23 AM           [name], [id number]  Note: An exclamation mark (!) indicates a result that was not dispersed into the flowsheet. Document Creation Date: 11/09/2010 11:23 AM _______________________________________________________________________  (1) Order result status: Final Collection or observation date-time: 11/09/2010 11:14 Requested date-time:  Receipt date-time:  Reported date-time:  Referring Physician:   Ordering Physician: Melvia Heaps [phone]) Specimen Source:  Source: Launa Grill Order Number: [id number] Lab site:   Appended Document: Colonoscopy    Clinical Lists Changes  Observations: Added new observation of COLONNXTDUE: 10/2020 (11/09/2010 11:35)

## 2011-01-14 NOTE — Op Note (Signed)
Summary: colonoscopy  (Dr Virginia Rochester)  NAME:  [name], [name]                  ACCOUNT NO.:  [id number]   MEDICAL RECORD NO.:  [id number]          PATIENT TYPE:  AMB   LOCATION:  ENDO                         FACILITY:  [facility]   PHYSICIAN:  Georgiana Spinner, M.D.    DATE OF BIRTH:  August 23, 1933   DATE OF PROCEDURE:  06/25/2005  DATE OF DISCHARGE:                                 OPERATIVE REPORT   PROCEDURE:  Colonoscopy.   INDICATIONS:  Hemoccult positivity, diarrhea.   ANESTHESIA:  Demerol 40, Versed 4 mg.   PROCEDURE:  With the patient mildly sedated in the left lateral decubitus  position, the Olympus videoscopic colonoscope was inserted into the rectum  and passed under direct vision to the cecum identified by ileocecal valve  and appendiceal orifice, both of which were photographed.  From this point,  the colonoscope was slowly withdrawn, taking circumferential views of the  colonic mucosa, stopping to photograph normal rectum on direct view and  hemorrhoids on retroflexed view.  The endoscope was straightened and  withdrawn.  Random biopsies were taken along the way of normal mucosa.  The  patient's vital signs and pulse oximeter remained stable.  The patient  tolerated the procedure well without apparent complications.   FINDINGS:  Internal hemorrhoids, otherwise unremarkable examination with  random biopsies taken because of diarrhea.  Await biopsy report.  The  patient will call me for results and follow-up with me as an outpatient.       GMO/MEDQ  D:  06/25/2005  T:  06/25/2005  Job:  [id number]

## 2011-01-14 NOTE — Letter (Signed)
Summary: Results Letter  [facility]  [address] [place], [place] [address]   Phone: [phone]  Fax: [phone]        January 20, 2010 MRN: [id number]    AMARIANA MIRANDO [address] [place], [place]  [address]    Dear Ms. Latour,   Your biopsies demonstrated inflammatory changes only.    Please follow the recommendations previously discussed.  Should you have any immediate concerns or questions, feel free to contact me at the office.    Sincerely,  Barbette Hair. Arlyce Dice, M.D., [facility]          Sincerely,  Louis Meckel MD  This letter has been electronically signed by your physician.  Appended Document: Results Letter Letter mailed 2.10.11

## 2011-01-14 NOTE — Letter (Signed)
Summary: Results Letter  [facility]  [address] [place], [place] [address]   Phone: [phone]  Fax: [phone]        February 05, 2010 MRN: [id number]    [facility] [address] [place], [place]  [address]    Dear [name],  Your hemeoccult cards testing for microscopic bleeding were negative.  No further GI workup is required at this time.   Please continue with the recommendations that we previously discussed. Should you have any further questions or concerns, feel free to contact me.    Sincerely,  Barbette Hair. Arlyce Dice, M.D., [facility]          Sincerely,  Louis Meckel MD  This letter has been electronically signed by your physician.

## 2011-01-14 NOTE — Assessment & Plan Note (Signed)
Summary: RECTAL PAIN/INCONTINENCE/BLACK STOOLS      (DR.KAPLAN PT.)  D...   History of Present Illness Visit Type: Follow-up Visit Primary GI MD: Melvia Heaps MD [facility] Primary Provider: Darci Needle, MD Chief Complaint: black stools, incontinence, rectal pain History of Present Illness:   76 YO FEMALE KNOWN TO DR. KAPLAN WITH HX OF ANTRAL ULCER,BARRETTS,DIVERTICULOSIS. SHE IS DIABETIC,HAS HX OF A CVA-ON PLAVIX. SHE COMES IN WITH CONCERNS ABOUT FECAL INCONTINENCE WHICH HAS BEEN A PROBLEM FOR A COUPLE YEARS BUT WORSE  MORE RECENTLY. SHE USUALLY HAS SENSE OF NEED FOR BM,AND HAS 1-2 BMS EACH MORNING. LATER IN THE DAY SHE WILL OFTEN HAVE OOZING OF STOOL,AND HAS NO CONTROL. SHE HAS ALSO HAD LONG TERM RIGHT  SIDED ABDOMINAL DISCOMFORT,STAYS SORE AND TENDER.. SHE NOED BLACK STOOLS  TWICE IN THE PAST FEW DAYS.   GI Review of Systems    Reports abdominal pain and  acid reflux.     Location of  Abdominal pain: right side.    Denies belching, bloating, chest pain, dysphagia with liquids, dysphagia with solids, heartburn, loss of appetite, nausea, vomiting, vomiting blood, weight loss, and  weight gain.      Reports black tarry stools, diarrhea, diverticulosis, fecal incontinence, and  rectal pain.     Denies anal fissure, change in bowel habit, constipation, heme positive stool, hemorrhoids, irritable bowel syndrome, jaundice, light color stool, liver problems, and  rectal bleeding. Preventive Screening-Counseling & Management  Alcohol-Tobacco     Smoking Cessation Counseling: yes    Current Medications (verified): 1)  Zetia 10 Mg Tabs (Ezetimibe) .... Take 1 Tablet By Mouth Once A Day 2)  Metoprolol Tartrate 100 Mg Tabs (Metoprolol Tartrate) .... Take 1 Tablet By Mouth Once A Day 3)  Estradiol 1 Mg Tabs (Estradiol) .... Take 1 Tablet By Mouth Two Times A Day 4)  Exforge 5-320 Mg Tabs (Amlodipine Besylate-Valsartan) .... Take 1 Tablet By Mouth Once A Day 5)  Actos 45 Mg Tabs (Pioglitazone Hcl) ....  Take 1 Tablet By Mouth Once A Day 6)  Plavix 75 Mg Tabs (Clopidogrel Bisulfate) .... Take 1 Tablet By Mouth Once A Day 7)  Crestor 20 Mg Tabs (Rosuvastatin Calcium) .... Take 1 Tablet By Mouth Once A Day 8)  Centrum  Tabs (Multiple Vitamins-Minerals) .... Take 1 Tablet By Mouth Once A Day 9)  Vitamin C 1000 Mg Tabs (Ascorbic Acid) .... Take 1 Tablet By Mouth Once A Day 10)  Glimepiride 4 Mg Tabs (Glimepiride) .... Once Daily 11)  Vitamin D 400 Unit Tabs (Cholecalciferol) .... Once Daily  Allergies (verified): 1)  ! * Ivp Dye 2)  ! * Latex 3)  * Shellfish  Past History:  Past Medical History: Anemia BARRETTS ESOPHAGUS GERD HX PUD/ANTRAL ULCER JAN 2010 Arthritis Hyperlipidemia Hypertension Diverticulosis Diabetes Mellitus Type II Hx of Colonic Polyps/NEGATIVE COLON 2006(DR. ORR)  Past Surgical History: CABG Angioplasty Hernioggraphy Partial Hysterectomy Bladder Surgery Cataract Extraction-Bilateral  Family History: Reviewed history from 01/14/2010 and no changes required. Patient adopted   Social History: Patient has never smoked.  Alcohol Use - no Illicit Drug Use - no Occupation: Retired  Review of Systems       The patient complains of anxiety-new, arthritis/joint pain, back pain, change in vision, cough, fatigue, headaches-new, swelling of feet/legs, and urination - excessive.  The patient denies allergy/sinus, anemia, blood in urine, breast changes/lumps, confusion, coughing up blood, depression-new, fainting, fever, hearing problems, heart murmur, heart rhythm changes, itching, menstrual pain, muscle pains/cramps, night sweats, nosebleeds, pregnancy symptoms,  shortness of breath, skin rash, sleeping problems, sore throat, swollen lymph glands, thirst - excessive , urination - excessive , urination changes/pain, urine leakage, vision changes, and voice change.         OTHERWISE AS IN HPI  Vital Signs:  Patient profile:   77 year old female Height:      63  inches Weight:      182.50 pounds BMI:     32.45 Pulse rate:   64 / minute Pulse rhythm:   regular BP sitting:   122 / 68  (left arm) Cuff size:   regular  Vitals Entered By: June McMurray CMA Duncan Dull) (March 04, 2010 10:23 AM)  Physical Exam  General:  Well developed, well nourished, no acute distress. Head:  Normocephalic and atraumatic. Eyes:  PERRLA, no icterus. Lungs:  Clear throughout to auscultation. Heart:  Regular rate and rhythm; no murmurs, rubs,  or bruits. Abdomen:  SOFT, MILDLY TENDER RLQ NO MASS OR HSM,BS+ Rectal:  DECREASED ANAL SPHINCTER TONE, STOOL BROWN HEME NEGATIVE Extremities:  No clubbing, cyanosis, edema or deformities noted. Neurologic:  Alert and  oriented x4;  grossly normal neurologically. Psych:  Alert and cooperative. Normal mood and affect.   Impression & Recommendations:  Problem # 1:  FECAL INCONTINENCE (ICD-787.6) Assessment Unchanged 76 YO FEMALE WITH 3-4 YEAR HX OF FECAL INCONTINENCE-WHICH IS GENERALLY MORE OOZING,THAN COMPLETE LOSS OF CONTROL.ANAL SPHINCTER LAXITY.  TRIAL OF BENEFIBER TO BULK STOOLS CONSIDER REFERRAL TO [facility]-DR SCARRLET FOLLOW UP WITH DR. KAPLAN IN 4-5 WEEKS  Problem # 2:  ABDOMINAL PAIN-RLQ (ICD-789.03) Assessment: Unchanged CHRONIC-LIKELY FUNCTIONAL  TRIAL OF BENTYL 10 MG TWICE DAILY  Problem # 3:  BARRETT'S ESOPHAGUS (ICD-530.85) Assessment: Unchanged LAST EGD 01/2010  START PRILOSEC 20 MG DAILY CHRONICALLY,ADVISED TO TAKE IN AM, AND PLAVIX AT NIGHTIME  Problem # 4:  CVA-STROKE (ICD-434.91) Assessment: Comment Only  Problem # 5:  DIVERTICULOSIS OF COLON (ICD-562.10) Assessment: Comment Only  Problem # 6:  DM (ICD-250.00) Assessment: Comment Only  Problem # 7:  CAD (ICD-414.00) Assessment: Comment Only  Problem # 8:  RECTAL DISCOMFORT NEGATIVE EXAM  TRIAL OF ANUSOL HC SUPP X 2-3 WEEKS THEN AS NEEDED  Patient Instructions: 1)  We sent a perscription for Bentyl  and Anusol HC Suppositories [facility]  [address]  2)  We have given you samples of Prilosec 20 mg, take 1 capsule 30 min before breakfast.  3)  We have also given you samples of Benefiber to take daily. Put 1 packet in a glass or bottle of water.  4)  We made you a follow up appointment with Dr. Arlyce Dice for 04-13-10 Monday at 10:15Am.  Appointment card given. 5)  Copy sent to : Darci Needle, MD 6)  The medication list was reviewed and reconciled.  All changed / newly prescribed medications were explained.  A complete medication list was provided to the patient / caregiver. Prescriptions: ANUSOL-HC 25 MG SUPP (HYDROCORTISONE ACETATE) Use 1 suppository rectally at bedtime  #10 x 0   Entered by:   Lowry Ram NCMA   Authorized by:   Sammuel Cooper PA-c   Signed by:   Lowry Ram NCMA on 03/04/2010   Method used:   [facility] to        [facility]  [address] [phone]* (retail)       [address]       [place], [place]  [address]  Ph: [phone] or [phone]       Fax: [phone]   RxID:   [id number] BENTYL 10 MG CAPS (DICYCLOMINE HCL) Take 1 tab twice daily for abdominal cramps and spasms  #60 x 1   Entered by:   Lowry Ram NCMA   Authorized by:   Sammuel Cooper PA-c   Signed by:   Lowry Ram NCMA on 03/04/2010   Method used:   [facility]        [facility]  [address] [phone]* (retail)       [address]       [place], [place]  [address]       Ph: [phone] or [phone]       Fax: [phone]   RxID:   [id number]

## 2011-01-14 NOTE — Letter (Signed)
Summary: Diabetic Instructions  [facility]  [address] [place], [place] [address]   Phone: [phone]  Fax: [phone]    [name] 11-18-1933 MRN: [id number]   x   ORAL DIABETIC MEDICATION INSTRUCTIONS  The day before your procedure:   Take your diabetic pill as you do normally  The day of your procedure:   Do not take your diabetic pill    We will check your blood sugar levels during the admission process and again in Recovery before discharging you home  ________________________________________________________________________  _  _   INSULIN (LONG ACTING) MEDICATION INSTRUCTIONS (Lantus, NPH, 70/30, Humulin, Novolin-N)   The day before your procedure:   Take  your regular evening dose    The day of your procedure:   Do not take your morning dose    _  _   INSULIN (SHORT ACTING) MEDICATION INSTRUCTIONS (Regular, Humulog, Novolog)   The day before your procedure:   Do not take your evening dose   The day of your procedure:   Do not take your morning dose   _  _   INSULIN PUMP MEDICATION INSTRUCTIONS  We will contact the physician managing your diabetic care for written dosage instructions for the day before your procedure and the day of your procedure.  Once we have received the instructions, we will contact you.

## 2011-01-14 NOTE — Op Note (Signed)
Summary: EGD ( Dr Virginia Rochester)  NAME:  [name], [name]                  ACCOUNT NO.:  [id number]      MEDICAL RECORD NO.:  [id number]          PATIENT TYPE:  AMB      LOCATION:  ENDO                         FACILITY:  [facility]      PHYSICIAN:  Georgiana Spinner, M.D.    DATE OF BIRTH:  [birth year]      DATE OF PROCEDURE:   DATE OF DISCHARGE:                                  OPERATIVE REPORT      PROCEDURE:  Upper endoscopy.      INDICATIONS:  Rectal bleeding.      ANESTHESIA:  Fentanyl 50 mcg, Versed 5 mg.      DESCRIPTION OF PROCEDURE:  With the patient mildly sedated in the left   lateral decubitus position, the Pentax videoscopic endoscope was   inserted in the mouth and passed under direct vision through the   esophagus which appeared normal, except for a small rim of slightly   inflamed tissue at the squamocolumnar junction.  We then entered into   the stomach, however, and immediately saw blood in the stomach.  There   were numerous small splotches of blood and streaks of blood, some   brownish in color in the fundus.  We advanced fundus, body to the antrum   and in the antrum was a very small whitish based ulcer with mild   surrounding erythema.  This was photographed and biopsied.  We advanced   to the duodenal bulb and second portion of the duodenum, both of which   appeared normal.  From this point, the endoscope was slowly withdrawn,   taking circumferential views of the duodenal mucosa until the endoscope   had been pulled back into the stomach and placed in retroflexion to view   the stomach from below.  The endoscope was then straightened and   withdrawn, taking circumferential views of remaining gastric and   esophageal mucosa, stopping in the fundus of the stomach to biopsy some   mild erythematous changes there and also in the distal esophagus where   the previously mentioned patch of the reddened mucosa was also biopsied.   The endoscope was withdrawn.  The patient's vital signs and  pulse   oximeter remained stable.  The patient tolerated the procedure well   without apparent complications.      FINDINGS:  Mild erythema of the body of the stomach.  Await biopsy   report.  Patch of mild changes of esophagitis distally biopsied and a   small antral ulcer with whitish base and very little depth and redness.   This was biopsied as well.  The etiology of the blood in the stomach is   not clear.  Will await biopsy report, but start the patient [facility] PPI   therapy and have the patient follow-up with me as an outpatient.                  ______________________________   Georgiana Spinner, M.D.            GMO/MEDQ  D:  12/20/2008  T:  12/20/2008  Job:  [id number]

## 2011-01-14 NOTE — Assessment & Plan Note (Signed)
Summary: rectal pain//discuss colon--ch.   History of Present Illness Visit Type: consult  Primary GI MD: Melvia Heaps MD [facility] Primary Provider: Claudie Fisherman, MD  Requesting Provider: Claudie Fisherman, MD  Chief Complaint: Consult colon. Pt c/o rectal pain  History of Present Illness:   [name] has returned c/o RLQ pain.  Pain is spontaneous, unaffected by bowel movements and radiates to the rectum. She denies melena or hematochezia.  She was recently placed on antibiotics for urinary tract infection.  She denies dysuria or urinary frequency.  She has a history of colon polyps and was last examined in 2006 (no polyps).     GI Review of Systems      Denies abdominal pain, acid reflux, belching, bloating, chest pain, dysphagia with liquids, dysphagia with solids, heartburn, loss of appetite, nausea, vomiting, vomiting blood, weight loss, and  weight gain.      Reports rectal pain.     Denies anal fissure, black tarry stools, change in bowel habit, constipation, diarrhea, diverticulosis, fecal incontinence, heme positive stool, hemorrhoids, irritable bowel syndrome, jaundice, light color stool, liver problems, and  rectal bleeding.    Current Medications (verified): 1)  Zetia 10 Mg Tabs (Ezetimibe) .... Take 1 Tablet By Mouth Once A Day 2)  Metoprolol Tartrate 100 Mg Tabs (Metoprolol Tartrate) .... Take 1 Tablet By Mouth Once A Day 3)  Estradiol 1 Mg Tabs (Estradiol) .... Take 1 Tablet By Mouth Two Times A Day 4)  Exforge 5-320 Mg Tabs (Amlodipine Besylate-Valsartan) .... Take 1 Tablet By Mouth Once A Day 5)  Actos 45 Mg Tabs (Pioglitazone Hcl) .... Take 1 Tablet By Mouth Once A Day 6)  Plavix 75 Mg Tabs (Clopidogrel Bisulfate) .... Take 1 Tablet By Mouth Once A Day 7)  Crestor 20 Mg Tabs (Rosuvastatin Calcium) .... Take 1 Tablet By Mouth Once A Day 8)  Centrum  Tabs (Multiple Vitamins-Minerals) .... Take 1 Tablet By Mouth Once A Day 9)  Vitamin C 1000 Mg Tabs (Ascorbic Acid) .... Take  1 Tablet By Mouth Once A Day 10)  Glimepiride 4 Mg Tabs (Glimepiride) .... Once Daily 11)  Vitamin D 400 Unit Tabs (Cholecalciferol) .... Once Daily 12)  Bentyl 10 Mg Caps (Dicyclomine Hcl) .... Take 1 Tab Twice Daily For Abdominal Cramps and Spasms 13)  Prilosec 20 Mg Cpdr (Omeprazole) .... Take 1 Capsule 30 Min Before Breakfast  Allergies (verified): 1)  ! * Ivp Dye 2)  ! * Latex 3)  * Shellfish  Past History:  Past Medical History: Anemia BARRETTS ESOPHAGUS GERD HX PUD/ANTRAL ULCER JAN 2010 Arthritis Hyperlipidemia Hypertension Diverticulosis Diabetes Mellitus Type II Hx of Colonic Polyps/NEGATIVE COLON 2006(DR. ORR) Hemorrhoids  Past Surgical History: Reviewed history from 03/04/2010 and no changes required. CABG Angioplasty Hernioggraphy Partial Hysterectomy Bladder Surgery Cataract Extraction-Bilateral  Family History: Reviewed history from 01/14/2010 and no changes required. Patient adopted   Social History: Reviewed history from 03/04/2010 and no changes required. Patient has never smoked.  Alcohol Use - no Illicit Drug Use - no Occupation: Retired  Review of Systems  The patient denies allergy/sinus, anemia, anxiety-new, arthritis/joint pain, back pain, blood in urine, breast changes/lumps, change in vision, confusion, cough, coughing up blood, depression-new, fainting, fatigue, fever, headaches-new, hearing problems, heart murmur, heart rhythm changes, itching, menstrual pain, muscle pains/cramps, night sweats, nosebleeds, pregnancy symptoms, shortness of breath, skin rash, sleeping problems, sore throat, swelling of feet/legs, swollen lymph glands, thirst - excessive , urination - excessive , urination changes/pain, urine leakage, vision  changes, and voice change.    Vital Signs:  Patient profile:   77 year old female Height:      63 inches Weight:      192 pounds BMI:     34.13 BSA:     1.90 Pulse rate:   88 / minute Pulse rhythm:   regular BP  sitting:   124 / 68  (left arm) Cuff size:   regular  Vitals Entered By: Ok Anis CMA (October 07, 2010 10:29 AM)  Physical Exam  Additional Exam:  N. physical exam she is a well-developed large female  Abdomen is without masses, tenderness organomegaly   Impression & Recommendations:  Problem # 1:  ABDOMINAL PAIN RIGHT LOWER QUADRANT (ICD-789.03)  Symptoms are nonspecific.  Radiation of pain into the rectum raises  question of a colonic abnormality.  She recently had a urinary tract infection which could also be a source for her pain.  Recommendations #1 urinalysis #2 colonoscopy if urinalysis is negative ( patient has a history of colon polyps)  Orders: Colonoscopy (Colon)  Other Orders: TLB-Udip w/ Micro (81001-URINE)  Patient Instructions: 1)  Copy sent to : Claudie Fisherman, MD  2)  Your colonoscopy is scheduled for  11/09/2010 at 10:30am 3)  You will go to the basment today for a lab 4)  The medication list was reviewed and reconciled.  All changed / newly prescribed medications were explained.  A complete medication list was provided to the patient / caregiver. 5)  Colonoscopy and Flexible Sigmoidoscopy brochure given.  6)  Conscious Sedation brochure given.  Prescriptions: MOVIPREP 100 GM  SOLR (PEG-KCL-NACL-NASULF-NA ASC-C) As per prep instructions.  #1 x 0   Entered by:   Merri Ray CMA (AAMA)   Authorized by:   Louis Meckel MD   Signed by:   Merri Ray CMA (AAMA) on 10/07/2010   Method used:   [facility]        [facility]  [address] [phone]* (retail)       [address]       [place], [place]  [id number]       Ph: [phone] or [phone]       Fax: [phone]   RxID:   [id number]

## 2011-01-14 NOTE — Letter (Signed)
Summary: Results Letter  [facility]  [address] [place], [place] [address]   Phone: [phone]  Fax: [phone]        January 14, 2010 MRN: [id number]    [name] [address] [place], [place]  [address]    Dear [name],  It is my pleasure to have treated you recently as a new patient in my office. I appreciate your confidence and the opportunity to participate in your care.  Since I do have a busy inpatient endoscopy schedule and office schedule, my office hours vary weekly. I am, however, available for emergency calls everyday through my office. If I am not available for an urgent office appointment, another one of our gastroenterologist will be able to assist you.  My well-trained staff are prepared to help you at all times. For emergencies after office hours, a physician from our Gastroenterology section is always available through my 24 hour answering service  Once again I welcome you as a new patient and I look forward to a happy and healthy relationship             Sincerely,  Louis Meckel MD  This letter has been electronically signed by your physician.  Appended Document: Results Letter letter mailed

## 2011-01-14 NOTE — Assessment & Plan Note (Signed)
Summary: f/U FROM PROCEDURE..JJ.   History of Present Illness Visit Type: Follow-up Visit Primary GI MD: Melvia Heaps MD [facility] Primary Provider: Claudie Fisherman, MD  Requesting Provider: NA Chief Complaint: Patient complains of generalized abdominal pain which she saw a MD a [facility] 2 weeks ago. She was put on cipro which she finished she has now been eating blad foods. She complains of fecal incontiance.  History of Present Illness:   [name] has returned for followup of abdominal discomfort.  Colonoscopy in November, 2011 was pertinent for diverticulosis.  She complains of frequent aching bilateral lower abdominal pain.  She also has episodes of loose stools that may be accompanied by incontinence.  She may or may not have the sensation of moving her bowels.  She was recently given antibiotics for her lower pain including Cipro.   GI Review of Systems    Reports abdominal pain.     Location of  Abdominal pain: generalized.    Denies acid reflux, belching, bloating, chest pain, dysphagia with liquids, dysphagia with solids, heartburn, loss of appetite, nausea, vomiting, vomiting blood, weight loss, and  weight gain.      Reports diverticulosis and  fecal incontinence.     Denies anal fissure, black tarry stools, change in bowel habit, constipation, diarrhea, heme positive stool, hemorrhoids, irritable bowel syndrome, jaundice, light color stool, liver problems, rectal bleeding, and  rectal pain.    Current Medications (verified): 1)  Zetia 10 Mg Tabs (Ezetimibe) .... Take 1 Tablet By Mouth Once A Day 2)  Metoprolol Tartrate 100 Mg Tabs (Metoprolol Tartrate) .... Take 1 Tablet By Mouth Once A Day 3)  Estradiol 1 Mg Tabs (Estradiol) .... Take 1 Tablet By Mouth Two Times A Day 4)  Exforge 5-320 Mg Tabs (Amlodipine Besylate-Valsartan) .... Take 1 Tablet By Mouth Once A Day 5)  Actos 45 Mg Tabs (Pioglitazone Hcl) .... Take 1 Tablet By Mouth Once A Day 6)  Plavix 75 Mg Tabs (Clopidogrel  Bisulfate) .... Take 1 Tablet By Mouth Once A Day 7)  Crestor 20 Mg Tabs (Rosuvastatin Calcium) .... Take 1 Tablet By Mouth Once A Day 8)  Centrum  Tabs (Multiple Vitamins-Minerals) .... Take 1 Tablet By Mouth Once A Day 9)  Vitamin C 1000 Mg Tabs (Ascorbic Acid) .... Take 1 Tablet By Mouth Once A Day 10)  Glimepiride 4 Mg Tabs (Glimepiride) .... Once Daily 11)  Vitamin D 400 Unit Tabs (Cholecalciferol) .... Once Daily 12)  Bentyl 10 Mg Caps (Dicyclomine Hcl) .... Take 1 Tab Twice Daily For Abdominal Cramps and Spasms  Allergies (verified): 1)  ! * Ivp Dye 2)  ! * Latex 3)  * Shellfish  Past History:  Past Medical History: Reviewed history from 10/07/2010 and no changes required. Anemia BARRETTS ESOPHAGUS GERD HX PUD/ANTRAL ULCER JAN 2010 Arthritis Hyperlipidemia Hypertension Diverticulosis Diabetes Mellitus Type II Hx of Colonic Polyps/NEGATIVE COLON 2006(DR. ORR) Hemorrhoids  Past Surgical History: Reviewed history from 03/04/2010 and no changes required. CABG Angioplasty Hernioggraphy Partial Hysterectomy Bladder Surgery Cataract Extraction-Bilateral  Family History: Reviewed history from 01/14/2010 and no changes required. Patient adopted   Social History: Reviewed history from 03/04/2010 and no changes required. Patient has never smoked.  Alcohol Use - no Illicit Drug Use - no Occupation: Retired  Review of Systems  The patient denies allergy/sinus, anemia, anxiety-new, arthritis/joint pain, back pain, blood in urine, breast changes/lumps, change in vision, confusion, cough, coughing up blood, depression-new, fainting, fatigue, fever, headaches-new, hearing problems, heart murmur, heart rhythm  changes, itching, menstrual pain, muscle pains/cramps, night sweats, nosebleeds, pregnancy symptoms, shortness of breath, skin rash, sleeping problems, sore throat, swelling of feet/legs, swollen lymph glands, thirst - excessive , urination - excessive , urination  changes/pain, urine leakage, vision changes, and voice change.    Vital Signs:  Patient profile:   77 year old female Height:      63 inches Weight:      192.0 pounds BMI:     34.13 Pulse rate:   86 / minute Pulse rhythm:   regular BP sitting:   120 / 60  (left arm) Cuff size:   regular  Vitals Entered By: Harlow Mares CMA Duncan Dull) (December 28, 2010 10:49 AM)   Impression & Recommendations:  Problem # 1:  ABDOMINAL PAIN RIGHT LOWER QUADRANT (ICD-789.03) Symptoms are very nonspecific and likely due to spasm.  Recommendations #1 trial of hyomax 0.375 mg b.i.d. (d/c bentyl)  Problem # 2:  FECAL INCONTINENCE (ICD-787.6) This may be related to urgency although she does not always had the sensation of passing small amounts of stool.  Recommendations #1 fiber supplementation #2 to consider anal manometry pending results from #1  Patient Instructions: 1)  Copy sent to : Claudie Fisherman, MD  2)  You will need to schedule a 2 month follow up appointment 3)  The medication list was reviewed and reconciled.  All changed / newly prescribed medications were explained.  A complete medication list was provided to the patient / caregiver. Prescriptions: HYOMAX-DT 0.375 MG CR-TABS (HYOSCYAMINE SULFATE) take one tab b.i.d. p.r.n. abdominal pain  #25 x 1   Entered and Authorized by:   Louis Meckel MD   Signed by:   Louis Meckel MD on 12/28/2010   Method used:   [facility] to        [facility] [address] [phone]* (retail)       [address]       [place], [place]  [id number]       Ph: [phone] or [phone]       Fax: [phone]   RxID:   [id number]

## 2011-01-14 NOTE — Progress Notes (Signed)
Summary: TRIAGE  Phone Note Call from Patient Call back at Home Phone [phone]   Caller: Patient Call For: Arlyce Dice Reason for Call: Talk to Nurse Summary of Call: Patient having a lot of rectal pain, would like to be seen sooner than her appt 4-18 Initial call taken by: Tawni Levy,  March 03, 2010 4:53 PM  Follow-up for Phone Call        Pt. c/o rectal pain and incontinence for several monthes, is getting worse. Stools are black for 2 days. She will see [facility] tomorrow morning at 10am. Follow-up by: Laureen Ochs LPN,  March 03, 2010 5:00 PM

## 2011-01-14 NOTE — Letter (Signed)
Summary: Diabetic Instructions  [facility]  [address] [place], [place] [address]   Phone: [phone]  Fax: [phone]    [name] [birth year] MRN: [id number]   X    ORAL DIABETIC MEDICATION INSTRUCTIONS ACTOS  The day before your procedure:   Take your diabetic pill as you do normally  The day of your procedure:   Do not take your diabetic pill    We will check your blood sugar levels during the admission process and again in Recovery before discharging you home  ________________________________________________________________________  _  _   INSULIN (LONG ACTING) MEDICATION INSTRUCTIONS (Lantus, NPH, 70/30, Humulin, Novolin-N)   The day before your procedure:   Take  your regular evening dose    The day of your procedure:   Do not take your morning dose    _  _   INSULIN (SHORT ACTING) MEDICATION INSTRUCTIONS (Regular, Humulog, Novolog)   The day before your procedure:   Do not take your evening dose   The day of your procedure:   Do not take your morning dose   _  _   INSULIN PUMP MEDICATION INSTRUCTIONS  We will contact the physician managing your diabetic care for written dosage instructions for the day before your procedure and the day of your procedure.  Once we have received the instructions, we will contact you.

## 2011-01-14 NOTE — Op Note (Signed)
Summary: EGD (Dr Virginia Rochester)  NAME:  [name], [name]                  ACCOUNT NO.:  [id number]      MEDICAL RECORD NO.:  [id number]          PATIENT TYPE:  AMB      LOCATION:  ENDO                         FACILITY:  [facility]      PHYSICIAN:  Georgiana Spinner, M.D.    DATE OF BIRTH:  07-04-1933      DATE OF PROCEDURE:  03/28/2009   DATE OF DISCHARGE:                                  OPERATIVE REPORT      PROCEDURE:  Upper endoscopy with biopsy.      INDICATIONS:  Gastroesophageal reflux disease and history of gastric   ulcer.      ANESTHESIA:  Fentanyl 25 mcg, Versed 2.5 mg.      PROCEDURE IN DETAIL:  With the patient mildly sedated in the left   lateral decubitus position the Pentax videoscopic endoscope was inserted   in the mouth, passed under direct vision through the esophagus which   appeared normal.  There was no evidence of Barrett's at this time.  We   then entered into the stomach, fundus, body, antrum, duodenal bulb,   second portion of duodenum appeared normal at first glance.  From this   point the endoscope was slowly withdrawn taking circumferential views of   duodenal mucosa until the endoscope had been pulled back and the stomach   placed in retroflexion to view the stomach from below.  The endoscope   was then straightened and the antrum was well visualized and there was a   patch of erythema where the ulcer presumed was before and it looked like   it was pretty much healed and I biopsied it and then withdrew the   endoscope taking circumferential views of remaining gastric and   esophageal mucosa.  The patient's vital signs and pulse oximeter   remained stable.  The patient tolerated the procedure well without   apparent complications.      FINDINGS:  Healed gastric ulcer and otherwise an unremarkable exam.      PLAN:  Await biopsy report.  The patient will call me for results and   follow up with me as an outpatient.                  ______________________________   Georgiana Spinner, M.D.            GMO/MEDQ  D:  03/28/2009  T:  03/28/2009  Job:  [id number]

## 2011-01-14 NOTE — Op Note (Signed)
Summary: EGD  (Dr Virginia Rochester)  NAME:  [name], [name]                  ACCOUNT NO.:  [id number]   MEDICAL RECORD NO.:  [id number]          PATIENT TYPE:  AMB   LOCATION:  ENDO                         [facility]ACILITY[facility]  [facility]   PHYSICIAN:  Georgiana Spinner, M.D.    DATE OF BIRTH:  [birth year]   DATE OF PROCEDURE:  06/25/2005  DATE OF DISCHARGE:                                 OPERATIVE REPORT   PROCEDURE:  Upper endoscopy.   INDICATIONS:  Hemoccult positivity.   ANESTHESIA:  Demerol 60, Versed 6 mg.   PROCEDURE:  With the patient mildly sedated in the left lateral decubitus  position, the Olympus videoscopic endoscope was inserted in the mouth,  passed under direct vision through the esophagus which appeared normal, into  the stomach, and we saw blood flecks scattered throughout the stomach.  The  fundus, body, antrum, duodenal bulb, second portion of duodenum were  visualized.  From this point, the endoscope was slowly withdrawn taking  circumferential views of duodenal mucosa until the endoscope had been pulled  back in the stomach and placed in retroflexion to view the stomach from  below.  The endoscope was then straightened and withdrawn, taking  circumferential views of the remaining gastric and esophageal mucosa,  stopping to photograph some nodularity seen in the antrum and erythema seen  in the body and fundus.  The endoscope was withdrawn.  The patient's vital  signs and pulse oximeter remained stable.  The patient tolerated the  procedure well without apparent complications.   FINDINGS:  Nodularity of antrum and erythema of fundus and body of stomach,  biopsies taken.  Await biopsy report.  The patient will call me for results  and follow-up with me as an outpatient.  Proceed to colonoscopy as planned.       GMO/MEDQ  D:  06/25/2005  T:  06/25/2005  Job:  [id number]

## 2011-01-14 NOTE — Assessment & Plan Note (Signed)
Summary: ANEMIA--CH.   History of Present Illness Visit Type: Initial Consult Primary GI MD: Melvia Heaps MD [facility] Primary Provider: Darci Needle, MD Chief Complaint: Patient c/o several months intermittent dark black stool as well as some lower abdominal cramping. She also c/o diarrhea. There are occasional problems with reflux and nausea as well. History of Present Illness:   [name] is a pleasant 77 year old [place] American female referred at the request of Dr. Juleen China for evaluation of anemia.  In December, 2010 she had several episodes of black stools.  Hemoglobin on December 18, 2009 was 10.9  This apparently was a drop.  In October, 2010 hemoglobin was 11.2.  Stools were Hemoccult negative on January 10.  The patient has a history of a gastric ulcer diagnosed by endoscopy in April, 2000.   Colonoscopy in 2008 demonstrated diverticulosis.  She was taking a pain medicine in December, 2010 following shoulder surgery.  She is unaware of its name.  She denies hematochezia.  She has some rectal discomfort following a bowel movement.  Bowels have been erratic she has occasional pyrosis.   GI Review of Systems    Reports abdominal pain, acid reflux, bloating, heartburn, and  nausea.     Location of  Abdominal pain: lower abdomen.    Denies belching, dysphagia with liquids, dysphagia with solids, loss of appetite, vomiting, vomiting blood, weight loss, and  weight gain.      Reports black tarry stools, change in bowel habits, fecal incontinence, and  rectal pain.     Denies anal fissure, constipation, diarrhea, diverticulosis, heme positive stool, hemorrhoids, irritable bowel syndrome, jaundice, light color stool, liver problems, and  rectal bleeding.  Preventive Screening-Counseling & Management  Alcohol-Tobacco     Smoking Status: never      Drug Use:  no.      Current Medications (verified): 1)  Zetia 10 Mg Tabs (Ezetimibe) .... Take 1 Tablet By Mouth Once A Day 2)  Metoprolol Tartrate  100 Mg Tabs (Metoprolol Tartrate) .... Take 1 Tablet By Mouth Once A Day 3)  Estradiol 1 Mg Tabs (Estradiol) .... Take 1 Tablet By Mouth Two Times A Day 4)  Exforge 5-320 Mg Tabs (Amlodipine Besylate-Valsartan) .... Take 1 Tablet By Mouth Once A Day 5)  Actos 45 Mg Tabs (Pioglitazone Hcl) .... Take 1 Tablet By Mouth Once A Day 6)  Plavix 75 Mg Tabs (Clopidogrel Bisulfate) .... Take 1 Tablet By Mouth Once A Day 7)  Crestor 20 Mg Tabs (Rosuvastatin Calcium) .... Take 1 Tablet By Mouth Once A Day 8)  Centrum  Tabs (Multiple Vitamins-Minerals) .... Take 1 Tablet By Mouth Once A Day 9)  Vitamin C 1000 Mg Tabs (Ascorbic Acid) .... Take 1 Tablet By Mouth Once A Day 10)  Vitamin D ([place] Dosage) .... Take 1 Tablet By Mouth Once A Day  Allergies (verified): 1)  ! * Ivp Dye 2)  ! * Latex 3)  * Shellfish  Past History:  Past Medical History: Anemia GERD Arthritis Hyperlipidemia Hypertension Diverticulosis Diabetes Mellitus Type II Hx of Colonic Polyps  Past Surgical History: Heart Bypass Angioplasty Hernioggraphy Partial Hysterectomy Bladder Surgery Cataract Extraction-Bilateral  Family History: Patient adopted   Social History: Patient has never smoked.  Alcohol Use - no Illicit Drug Use - no Smoking Status:  never Drug Use:  no  Review of Systems       The patient complains of arthritis/joint pain, back pain, cough, fatigue, headaches-new, and swelling of feet/legs.  All other systems were reviewed and were negative   Vital Signs:  Patient profile:   77 year old female Height:      63 inches Weight:      182 pounds BMI:     32.36 BSA:     1.86 Pulse rate:   68 / minute Pulse rhythm:   regular BP sitting:   100 / 54  (left arm)  Vitals Entered By: Hortense Ramal CMA Duncan Dull) (January 14, 2010 9:27 AM)  Physical Exam  Additional Exam:  She is a well-developed well-nourished female  skin: anicteric HEENT: normocephalic; PEERLA; no nasal or pharyngeal  abnormalities neck: supple nodes: no cervical lymphadenopathy chest: clear to ausculatation and percussion heart: no murmurs, gallops, or rubs abd: soft, nontender; BS normoactive; no abdominal masses, tenderness, organomegaly rectal: deferred ext: no cynanosis, clubbing, edema skeletal: no deformities neuro: oriented x 3; no focal abnormalities    Impression & Recommendations:  Problem # 1:  ANEMIA OF CHRONIC DISEASE (ICD-285.29)  The patient has a mild anemia and, by history, likely had a limited GI bleed.  I am suspicious that she may have recurrent gastric ulcer in view of her probable intake of NSAIDs.  Recommendations #1 upper endoscopy #2 followup Hemoccults if #1 is negative. #3 avoid NSAIDs in view of history of gastric ulcer  Orders: EGD (EGD)  Problem # 2:  PERSONAL HISTORY OF COLONIC POLYPS (ICD-V12.72) Plan followup colonoscopy in 2015  Problem # 3:  DM (ICD-250.00) Assessment: Comment Only  Problem # 4:  CAD (ICD-414.00) Assessment: Comment Only  Patient Instructions: 1)  Conscious Sedation brochure given.  2)  Upper Endoscopy brochure given.  3)  Avoid NSAIDS 4)  CC Dr. Adela Lank 5)  Your EGD is scheduled for 01/16/2010 at 11:30am 6)  The medication list was reviewed and reconciled.  All changed / newly prescribed medications were explained.  A complete medication list was provided to the patient / caregiver.

## 2011-01-14 NOTE — Procedures (Signed)
Summary: Upper Endoscopy  Patient: [name] Note: All result statuses are Final unless otherwise noted.  Tests: (1) Upper Endoscopy (EGD)   EGD Upper Endoscopy       DONE     [facility]     [address]     [place], [place]  [address]           ENDOSCOPY PROCEDURE REPORT           PATIENT:  [name], [name]  MR#:  [id number]     BIRTHDATE:  04-Jan-1933, 76 yrs. old  GENDER:  female           ENDOSCOPIST:  Barbette Hair. Arlyce Dice, MD     Referred by:           PROCEDURE DATE:  01/16/2010     PROCEDURE:  EGD with biopsy     ASA CLASS:  Class II     INDICATIONS:  anemia           MEDICATIONS:   Fentanyl 50 mcg IV, Versed 4 mg IV, glycopyrrolate     (Robinal) 0.2 mg IV, 0.6cc simethancone 0.6 cc PO     TOPICAL ANESTHETIC:  Exactacain Spray           DESCRIPTION OF PROCEDURE:   After the risks benefits and     alternatives of the procedure were thoroughly explained, informed     consent was obtained.  The [facility] GIF-H180 [id number] endoscope was     introduced through the mouth and advanced to the third portion of     the duodenum, without limitations.  The instrument was slowly     withdrawn as the mucosa was fully examined.     <<PROCEDUREIMAGES>>           Mild gastritis was found in the body and the antrum of the     stomach. Areas of erythema, erosions (see image2 and image1). Bxs     taken  other findings.    Retroflexed views revealed no     abnormalities.    The scope was then withdrawn from the patient     and the procedure completed.           COMPLICATIONS:  None           ENDOSCOPIC IMPRESSION:     1) Mild gastritis in the body and the antrum of the stomach -     findings could reflect prior ulcerative disease           RECOMMENDATIONS:     1) hemmoccult stools in 1 week           REPEAT EXAM:  No           ______________________________     Barbette Hair. Arlyce Dice, MD           CC:  Adela Lank, MD           n.     Rosalie Doctor:   Barbette Hair. Kaplan at 01/16/2010 12:12  PM           [name], [id number]  Note: An exclamation mark (!) indicates a result that was not dispersed into the flowsheet. Document Creation Date: 01/16/2010 12:13 PM _______________________________________________________________________  (1) Order result status: Final Collection or observation date-time: 01/16/2010 12:09 Requested date-time:  Receipt date-time:  Reported date-time:  Referring Physician:   Ordering Physician: Melvia Heaps [phone]) Specimen Source:  Source: Launa Grill Order Number: [id number] Lab site:

## 2011-01-14 NOTE — Letter (Signed)
Summary: New Patient letter  [facility]  [address] [place], [place] [address]   Phone: [phone]  Fax: [phone]       12/22/2009 MRN: [id number]  [name] [address] [place], [place]  [address]  Dear [name],  Welcome to the Gastroenterology Division at [facility].    You are scheduled to see Dr. Arlyce Dice on 01-14-10 at 9:00a.m. on the 3rd floor at [facility], [address]  We ask that you try to arrive at our office 15 minutes prior to your appointment time to allow for check-in.  We would like you to complete the enclosed self-administered evaluation form prior to your visit and bring it with you on the day of your appointment.  We will review it with you.  Also, please bring a complete list of all your medications or, if you prefer, bring the medication bottles and we will list them.  Please bring your insurance card so that we may make a copy of it.  If your insurance requires a referral to see a specialist, please bring your referral form from your primary care physician.  Co-payments are due at the time of your visit and may be paid by cash, check or credit card.     Your office visit will consist of a consult with your physician (includes a physical exam), any laboratory testing he/she may order, scheduling of any necessary diagnostic testing (e.g. x-ray, ultrasound, CT-scan), and scheduling of a procedure (e.g. Endoscopy, Colonoscopy) if required.  Please allow enough time on your schedule to allow for any/all of these possibilities.    If you cannot keep your appointment, please call [phone] to cancel or reschedule prior to your appointment date.  This allows [place] the opportunity to schedule an appointment for another patient in need of care.  If you do not cancel or reschedule by 5 p.m. the business day prior to your appointment date, you will be charged a $50.00 late cancellation/no-show fee.    Thank you for choosing [facility] for your medical needs.  We appreciate the opportunity to care for you.  Please visit [place] at our website  to learn more about our practice.                     Sincerely,                                                             The Gastroenterology Division

## 2011-02-26 ENCOUNTER — Encounter: Payer: Self-pay | Admitting: Gastroenterology

## 2011-03-02 NOTE — Letter (Signed)
Summary: Office Visit Letter  [facility]  [address] [place], [place] [address]   Phone: [phone]  Fax: [phone]      February 26, 2011 MRN: [id number]   [name] [address] [place], [place]  [address]   Dear Ms. Fahrner,   According to our records, it is time for you to schedule a follow-up office visit with [place].   At your convenience, please call [phone] (option #2)to schedule an office visit. If you have any questions, concerns, or feel that this letter is in error, we would appreciate your call.   Sincerely,  Barbette Hair. Arlyce Dice, M.D.   [facility] [phone]

## 2011-03-05 LAB — GLUCOSE, CAPILLARY: Glucose-Capillary: 97 mg/dL (ref 70–99)

## 2011-03-22 LAB — CBC
HCT: 34 % — ABNORMAL LOW (ref 36.0–46.0)
MCV: 89 fL (ref 78.0–100.0)
RBC: 3.82 MIL/uL — ABNORMAL LOW (ref 3.87–5.11)
WBC: 3.9 10*3/uL — ABNORMAL LOW (ref 4.0–10.5)

## 2011-03-22 LAB — URINE MICROSCOPIC-ADD ON

## 2011-03-22 LAB — APTT: aPTT: 29 seconds (ref 24–37)

## 2011-03-22 LAB — URINALYSIS, ROUTINE W REFLEX MICROSCOPIC
Bilirubin Urine: NEGATIVE
Glucose, UA: NEGATIVE mg/dL
Ketones, ur: NEGATIVE mg/dL
Nitrite: NEGATIVE
pH: 5.5 (ref 5.0–8.0)

## 2011-03-22 LAB — BASIC METABOLIC PANEL
BUN: 10 mg/dL (ref 6–23)
Chloride: 105 mEq/L (ref 96–112)
GFR calc Af Amer: >60 mL/min (ref >60)
Potassium: 4.5 mEq/L (ref 3.5–5.1)

## 2011-03-22 LAB — GLUCOSE, CAPILLARY
Glucose-Capillary: 126 mg/dL — ABNORMAL HIGH (ref 70–99)
Glucose-Capillary: 150 mg/dL — ABNORMAL HIGH (ref 70–99)

## 2011-03-22 LAB — DIFFERENTIAL
Eosinophils Absolute: 0.1 10*3/uL (ref 0.0–0.7)
Eosinophils Relative: 3 % (ref 0–5)
Lymphs Abs: 0.8 10*3/uL (ref 0.7–4.0)
Monocytes Relative: 10 % (ref 3–12)

## 2011-03-24 LAB — GLUCOSE, CAPILLARY: Glucose-Capillary: 106 mg/dL — ABNORMAL HIGH (ref 70–99)

## 2011-04-19 ENCOUNTER — Other Ambulatory Visit: Payer: Self-pay | Admitting: Orthopedic Surgery

## 2011-04-19 DIAGNOSIS — M5410 Radiculopathy, site unspecified: Secondary | ICD-10-CM

## 2011-04-26 ENCOUNTER — Ambulatory Visit
Admission: RE | Admit: 2011-04-26 | Discharge: 2011-04-26 | Disposition: A | Discharge status: Home / Self Care | Payer: Medicare Other | Source: Ambulatory Visit | Attending: Orthopedic Surgery | Admitting: Orthopedic Surgery

## 2011-04-26 DIAGNOSIS — M5410 Radiculopathy, site unspecified: Secondary | ICD-10-CM

## 2011-04-27 NOTE — Op Note (Signed)
[name], [name]                  ACCOUNT NO.:  [id number]   MEDICAL RECORD NO.:  [id number]          PATIENT TYPE:  AMB   LOCATION:  SDS                          FACILITY:  [facility]   PHYSICIAN:  Almedia Balls. Ranell Patrick, M.D. DATE OF BIRTH:  November 24, 1933   DATE OF PROCEDURE:  06/06/2009  DATE OF DISCHARGE:  06/06/2009                               OPERATIVE REPORT   PREOPERATIVE DIAGNOSIS:  Left shoulder osteoarthritis.   POSTOPERATIVE DIAGNOSES:  1. Left shoulder osteoarthritis.  2. Left shoulder frozen shoulder.  3. Multiple loose bodies.   PROCEDURE PERFORMED:  Left shoulder arthroscopy with extensive intra-  articular debridement including removal of multiple loose bodies,  arthroscopic capsular release, arthroscopic bursectomy, but no  acromioplasty was performed due to os acromiale.   ATTENDING SURGEON:  Almedia Balls. Ranell Patrick, MD   ASSISTANT:  Donnie Coffin. Dixon, [facility]   ANESTHESIA:  General anesthesia plus interscalene block anesthesia was  used.   ESTIMATED BLOOD LOSS:  Minimal.   FLUID REPLACEMENT:  1000 mL crystalloid.   INSTRUMENT COUNT:  Correct.   COMPLICATIONS:  No complications.   Perioperative antibiotics were given.   INDICATIONS:  The patient is a 77 year old female with a history of  worsening left shoulder pain secondary to some chondromalacia in her  shoulder as well as multiple loose bodies evidenced by MRI.  The patient  has had increasing pain in her shoulder despite conservative measures  including injection therapy, anti-inflammatories.  She presents now for  operative treatment to try to restore some function and eliminate pain  in her shoulder.  Informed consent was obtained.   DESCRIPTION OF PROCEDURE:  After an adequate level of anesthesia was  achieved, the patient was positioned in the modified beachchair  position.  The left shoulder was sterilely prepped and draped in the  usual manner.  Prior to the sterile prep and drape we did an exam under  anesthesia, which revealed significant stiffness in the shoulder  including forward elevation to about 90 degrees, abduction 60 degrees,  external rotation of about 20, internal rotation barely to her abdomen.  We performed progressive manipulation under anesthesia resulting in  progressive increase in range of motion.  We were able to get nearly  full range of motion in her shoulder with the exception of external  rotation up to only about 45-50 degrees.  We then went ahead and scoped  her shoulder after sterilely prepping and draping through standard  portals, anterior, posterior, and lateral portals.  We found extensive  synovitis within the shoulder, quite a bit a chondromalacia with  delamination of cartilage off her humeral head, and quite a bit of  cartilage loss on her glenoid side.  We performed a chondroplasty on  both sides of the joint, and the subscap was normal.  Capsular  disruption was noted from the manipulation inferiorly, was not  disrupted.  We performed a capsular release with ArthroCare unit  carefully.  The rotator cuff appeared to be intact, quite a bit  synovitis around that, which was taken care of with the ArthroCare unit.  Biceps and  biceps anchor was intact, we left it alone.  Posterior  capsule also disrupted from the manipulation.  At this point, having  done a chondroplasty, complete capsule inspection and release of the  inferior capsule, we went ahead and placed the scope in the subacromial  space.  The rotator cuff was intact. We performed a bursectomy but did  not do any acromioplasty.  It was noted that even just taking the bursa  out revealed the os acromiale, which appear to have some movement to it.  I cannot say that it was completely unstable, but there was definitely  some relative motion when I pushed down on the acromion.  Based on that,  we were nervous about doing any type of work on the acromion to include  even taking off a little lateral  overhang, which was what I had planned  on.  At this point, we concluded surgery, suturing the wounds with 4-0  Monocryl followed by Steri-Strips and sterile compressive bandage and  shoulder sling.  The patient tolerated the surgery well.      Almedia Balls. Ranell Patrick, M.D.  Electronically Signed     SRN/MEDQ  D:  06/06/2009  T:  06/07/2009  Job:  [id number]

## 2011-04-27 NOTE — Op Note (Signed)
[name], [name]                  ACCOUNT NO.:  [id number]   MEDICAL RECORD NO.:  [id number]          PATIENT TYPE:  AMB   LOCATION:  ENDO                         FACILITY[facility]   PHYSICIAN:  Georgiana Spinner, M.D.    DATE OF BIRTH:  1933-08-02   DATE OF PROCEDURE:  03/28/2009  DATE OF DISCHARGE:                               OPERATIVE REPORT   PROCEDURE:  Upper endoscopy with biopsy.   INDICATIONS:  Gastroesophageal reflux disease and history of gastric  ulcer.   ANESTHESIA:  Fentanyl 25 mcg, Versed 2.5 mg.   PROCEDURE IN DETAIL:  With the patient mildly sedated in the left  lateral decubitus position the Pentax videoscopic endoscope was inserted  in the mouth, passed under direct vision through the esophagus which  appeared normal.  There was no evidence of Barrett's at this time.  We  then entered into the stomach, fundus, body, antrum, duodenal bulb,  second portion of duodenum appeared normal at first glance.  From this  point the endoscope was slowly withdrawn taking circumferential views of  duodenal mucosa until the endoscope had been pulled back and the stomach  placed in retroflexion to view the stomach from below.  The endoscope  was then straightened and the antrum was well visualized and there was a  patch of erythema where the ulcer presumed was before and it looked like  it was pretty much healed and I biopsied it and then withdrew the  endoscope taking circumferential views of remaining gastric and  esophageal mucosa.  The patient's vital signs and pulse oximeter  remained stable.  The patient tolerated the procedure well without  apparent complications.   FINDINGS:  Healed gastric ulcer and otherwise an unremarkable exam.   PLAN:  Await biopsy report.  The patient will call me for results and  follow up with me as an outpatient.           ______________________________  Georgiana Spinner, M.D.     GMO/MEDQ  D:  03/28/2009  T:  03/28/2009  Job:  [id number]

## 2011-04-27 NOTE — Op Note (Signed)
NAMESHELSIE, TIJERINO                  ACCOUNT NO.:  [id number]   MEDICAL RECORD NO.:  [id number]          PATIENT TYPE:  AMB   LOCATION:  ENDO                         FACILITY:  [facility]   PHYSICIAN:  Georgiana Spinner, M.D.    DATE OF BIRTH:  19-Apr-1933   DATE OF PROCEDURE:  DATE OF DISCHARGE:                               OPERATIVE REPORT   PROCEDURE:  Flexible sigmoidoscopy.   INDICATIONS:  Incontinence of stool with diarrhea and abdominal pain.   ANESTHESIA:  None given.   PROCEDURE IN DETAIL:  With the patient mildly sedated in the left  lateral decubitus position, the Pentax videoscopic colonoscope was  inserted in the rectum, passed under direct vision to approximately 44  cm from anal verge at which point the patient became uncomfortable, so I  stopped the passage of the scope that point.  From this point, the  colonoscope was slowly withdrawn, taking circumferential views of  colonic mucosa stopping only in the rectum which appeared normal  indirect, showed hemorrhoids on retroflexed view.  The endoscope was  straightened and withdrawn.  The patient's vital signs, pulse oximeter  remained stable.  The patient tolerated procedure well without apparent  complications.   FINDINGS:  Diverticulosis of sigmoid colon.  Internal hemorrhoids.  Of  note, the rectal sphincter was somewhat lax and once again encouraged  her to perform Kegel exercises and follow up with me as an outpatient in  approximately 6 weeks.           ______________________________  Georgiana Spinner, M.D.     GMO/MEDQ  D:  09/27/2007  T:  09/27/2007  Job:  [id number]

## 2011-04-27 NOTE — Op Note (Signed)
NAMEELYSE, [name] NO.:  [id number]   MEDICAL RECORD NO.:  [id number]          PATIENT TYPE:  AMB   LOCATION:  ENDO                         FACILITY:  [facility]   PHYSICIAN:  Georgiana Spinner, M.D.    DATE OF BIRTH:  1933/08/29   DATE OF PROCEDURE:  DATE OF DISCHARGE:                               OPERATIVE REPORT   PROCEDURE:  Upper endoscopy.   INDICATIONS:  Rectal bleeding.   ANESTHESIA:  Fentanyl 50 mcg, Versed 5 mg.   DESCRIPTION OF PROCEDURE:  With the patient mildly sedated in the left  lateral decubitus position, the Pentax videoscopic endoscope was  inserted in the mouth and passed under direct vision through the  esophagus which appeared normal, except for a small rim of slightly  inflamed tissue at the squamocolumnar junction.  We then entered into  the stomach, however, and immediately saw blood in the stomach.  There  were numerous small splotches of blood and streaks of blood, some  brownish in color in the fundus.  We advanced fundus, body to the antrum  and in the antrum was a very small whitish based ulcer with mild  surrounding erythema.  This was photographed and biopsied.  We advanced  to the duodenal bulb and second portion of the duodenum, both of which  appeared normal.  From this point, the endoscope was slowly withdrawn,  taking circumferential views of the duodenal mucosa until the endoscope  had been pulled back into the stomach and placed in retroflexion to view  the stomach from below.  The endoscope was then straightened and  withdrawn, taking circumferential views of remaining gastric and  esophageal mucosa, stopping in the fundus of the stomach to biopsy some  mild erythematous changes there and also in the distal esophagus where  the previously mentioned patch of the reddened mucosa was also biopsied.  The endoscope was withdrawn.  The patient's vital signs and pulse  oximeter remained stable.  The patient tolerated the procedure  well  without apparent complications.   FINDINGS:  Mild erythema of the body of the stomach.  Await biopsy  report.  Patch of mild changes of esophagitis distally biopsied and a  small antral ulcer with whitish base and very little depth and redness.  This was biopsied as well.  The etiology of the blood in the stomach is  not clear.  Will await biopsy report, but start the patient [facility] PPI  therapy and have the patient follow-up with me as an outpatient.           ______________________________  Georgiana Spinner, M.D.     GMO/MEDQ  D:  12/20/2008  T:  12/20/2008  Job:  [id number]

## 2011-04-30 NOTE — Discharge Summary (Signed)
[name], [name]                  ACCOUNT NO.:  [id number]   MEDICAL RECORD NO.:  [id number]          PATIENT TYPE:  AMB   LOCATION:  SDS                          FACILITY:  [facility]   PHYSICIAN:  [name], M.D. DATE OF BIRTH:  05-08-1933   DATE OF ADMISSION:  06/06/2009  DATE OF DISCHARGE:  06/07/2009                               DISCHARGE SUMMARY   ADMISSION DIAGNOSIS:  Left shoulder pain and osteoarthritis.   DISCHARGE DIAGNOSIS:  Left shoulder pain and osteoarthritis.   BRIEF HISTORY:  The patient is a 77 year old female with worsening left  shoulder pain that has been refractory to conservative treatment and  cortisone injections.  The patient elected to have a left shoulder  arthroscopy and clean-out by Dr. Malon Kindle.   PROCEDURE:  The patient had a left shoulder arthroscopy with removal of  multiple loose bodies and a capsule released by Dr. Malon Kindle on  June 06, 2009.   ASSISTANT:  Donnie Coffin. Dixon, PA-C   ANESTHESIA:  General anesthesia was used with an interscalene block.   COMPLICATIONS:  No complications.   HOSPITAL COURSE:  The patient is admitted on June 06, 2009.  The patient  tolerated the procedure well.  After adequate time in Postanesthesia  Care Unit, she was transferred to 5000.  On postop day #1, the patient  complained of minimal pain to the left shoulder.  Neurologically, she is  intact.  Sling was in place.  She was able to work with a little bit of  physical therapy and thus was discharged home on June 07, 2009, with  followup scheduled in 2 weeks.  The patient's condition is stable upon  discharge.   DISCHARGE MEDICATIONS:  Robaxin 500 mg p.o. q.6 h. And Percocet 5/325  and continue her home meds.   CONDITION:  Stable.   DIET:  Regular.     Thomas B. Durwin Nora, P.A.      [name], M.D.  Electronically Signed   TBD/MEDQ  D:  06/10/2009  T:  06/11/2009  Job:  [id number]

## 2011-04-30 NOTE — H&P (Signed)
[facility]  Patient:    [name], [name] Visit Number: [id number] MRN: [id number]          Service Type: Attending:  Kerrin Champagne, M.D. Dictated by:   Ralene Bathe, P.A. Adm. Date:  02/07/02   CC:         Aram Candela. Aleen Campi, M.D.   History and Physical  DATE OF BIRTH:  1932-12-29. DATE OF HISTORY AND PHYSICAL:  January 31, 2002.  CHIEF COMPLAINT:  Back and left greater than right leg pain.  HISTORY OF PRESENT ILLNESS:  [name] is a 77 year old female with continued back and leg pain secondary to degenerative disk disease and spinal stenosis. She has failed conservative measures.  She is having significant pain limiting her activities of daily living.  She is at the point where she wished to proceed with decompression.  Due to her involvement of the spine and her findings on studies, it is felt that she should undergo a central laminectomy with decompression and posterolateral fusion.  The risks and benefits discussed with the patient.  She donated one unit of autologous blood with this procedure.  She did receive medical clearance from Dr. Aleen Campi.  PAST MEDICAL HISTORY: 1. Coronary artery disease, status post CABG. 2. Hypertension. 3. Noninsulin-dependent diabetes. 4. GERD. 4. Hypercholesterolemia.  PAST SURGICAL HISTORY: 1. BTL. 2. Hysterectomy. 3. Bladder tacking. 4. Lumbar surgery in 1995. 5. Cardiac catheterization. 6. CABG.  ALLERGIES:  LATEX causes topical itching, and FISH and IVP dye cause anaphylaxis.  MEDICATIONS:  Actos 45 mg daily, Toprol XL 50 mg q.d., Hyzaar 50/12.5 mg q.d., Amaryl 2 mg b.i.d., Prilosec 20 mg q.d., Lipitor 40 mg q.d., Estrace 0.5 mg q.d., over-the-counter vitamin Centrum, and vitamin E.  SOCIAL HISTORY:  She denies tobacco or alcohol use, lives in a [place] home. Husband is in poor health currently.  Depending on progress, might need some extra assistance.  FAMILY HISTORY:  Unknown, as she  was adopted.  REVIEW OF SYSTEMS:  The patient denies any recent fevers, chills, night sweats.  No bleeding tendencies.  CENTRAL NERVOUS SYSTEM:  No blurred or double vision, seizures, headaches, paralysis.  RESPIRATORY:  No shortness of breath, productive cough.  HEART:  She has occasional chest heaviness attributed to stable angina.  This is well-controlled.  GASTROINTESTINAL:  No nausea, vomiting, constipation, melena, or blood in stools.  GENITOURINARY: No dysuria or hematuria.  MUSCULOSKELETAL:  As pertinent to the present illness.  PHYSICAL EXAMINATION:  VITAL SIGNS:  Blood pressure is 108/62.  GENERAL:  A well-developed, well-nourished 78 year old female in obvious discomfort.  HEENT:  Normocephalic.  Extraocular movements intact.  NECK:  Supple.  No lymphadenopathy, no carotid bruits appreciated on exam.  CHEST:  Clear to auscultation without rales or rhonchi.  CARDIAC:  Regular rate and rhythm, no murmurs, gallops, rubs, heaves, or thrills.  ABDOMEN:  Positive bowel sounds, soft, nontender.  EXTREMITIES:  Neurovascularly she is intact grossly with good pulses. Reflexes are diminished at the knee symmetrically and ankle, symmetric.  IMPRESSION: 1. Spinal stenosis L4-5 and grade 1 spondylolisthesis with degenerative disk    disease at L5-S1, status post laminectomy of the segment. 2. Coronary artery disease, status post coronary artery bypass graft. 3. Hypertension. 4. Noninsulin-dependent diabetes. 5. Gastroesophageal reflux disease. 6. Hypercholesterolemia.  PLAN:  The patient will be admitted for central laminectomy L4-5 with posterolateral fusion and right iliac crest bone grafting.  Perhaps extension to the L5-S1 region depending on intraoperative findings.  She does have one  unit of autologous blood if needed. Dictated by:   Ralene Bathe, P.A. Attending:  Kerrin Champagne, M.D. DD:  02/08/02 TD:  02/08/02 Job: [id number]

## 2011-04-30 NOTE — Discharge Summary (Signed)
[facility]  Patient:    [name], [name] Visit Number: [id number] MRN: [id number]          Service Type: ECR Location: [id number] Attending Physician:  Herold Harms Dictated by:   Marcie Bal Troncale, P.A.C. Admit Date:  02/12/2002 Disc. Date: 02/12/02   CC:         Faith Rogue, M.D.  John R. Aleen Campi, M.D.   Discharge Summary  DISCHARGE DIAGNOSES: 1. Lumbar spinal stenosis with lateral recess stenosis L4-L5 with grade 1    degenerative spondylolithesis at the L4-L5 level. 2. Coronary artery disease, status post coronary artery bypass graft;. 3. Hypertension. 4. Non-insulin-dependent diabetes mellitus. 5. Gastroesophageal reflux disease. 6. Hypercholesterolemia.  PROCEDURE:  Central laminectomy L4-L5 with decompression of bilateral L4 and L5 nerve roots and posterolateral fusion utilizing right iliac crest bone graft harvested through a separate fascial incision performed by Dr. Otelia Sergeant with the assistance of Haze Rushing, P.A.-C on February 07, 2002.  Please see operative summary for further details.  CONSULTATION:  Dr. Riley Kill, physical medicine and rehabilitation.  LABORATORY DATA AND X-RAY FINDINGS:  Preadmission H&H were 10.8 and 32.7 respectively on January 31, 2002.  This reached a low of 9.2 and 27.9 respectively on February 10, 2002.  PT/INR and PTT preoperatively were 13.1, 1.0 and 29 respectively on January 31, 2002.  Routine chemistries preoperatively were all within normal limits with a sodium of 141, potassium 4.0, BUN 9 and creatinine 0.9.  On postop day #1, February 27, she had just a slight decrease in her potassium of 3.4.  Urinalysis on February 19, was all within normal limits with negative parameters.  Urinalysis on March 1, just showed rare epithelial cells, trace ketones, but negative nitrites and leukocytes.  Chest x-ray from January 31, 2002, showed slight cardiomegaly with poor inspiration.  Chest x-ray  on February 10, 2002, showed a new right lower lobe air space disease and/or atelectasis.  EKG from January 31, 2002, showed normal sinus rhythm and slight ST and T wave abnormality.  CHIEF COMPLAINT:  Back and left leg pain.  HISTORY OF PRESENT ILLNESS:  [name] is a 77 year old female who presents with complaints of worsening lower back pain and left greater than right leg pain.  She is known to have significant degenerative disc disease and spinal stenosis.  She has undergone conservative measures without improvement.  Pain is now interfering with her activities of daily living.  Because of her lack of improvement with conservative measures, she has elected to undergo surgical intervention.  We did receive preoperative medical clearance from Dr. Aleen Campi prior to proceeding with surgery.  HOSPITAL COURSE:  Following the surgical procedure, the patient was taken to the PACU in stable condition then transferred to the orthopedic floor in good condition.  She did well during her postoperative stay.  She slowly progressed with physical therapy.  She received routine postoperative antibiotics and pain medications.  The wound was examined on postop day #2 and subsequently found to be clean, dry and intact without signs or symptoms of infection.  She did run a moderate temperature up to 102.2 postop.  A urinalysis and chest x-ray was obtained which showed no evidence of acute infection, but rather temperature was felt secondary to atelectasis.  Incentive spirometer was encouraged and she improved and remained afebrile afterwards.  A rehabilitation consult was requested and Dr. Riley Kill evaluated the patient and felt she would be an acceptable candidate for inpatient rehabilitation.  By postop day #5, a  bed was available.  She was medically and orthopedically stable and ready for discharge.  DISPOSITION:  The patient is being discharged to [facility].  DIET:  Follow a diabetic  diet.  FOLLOWUP:  Follow up with Dr. Otelia Sergeant in 10 days.  Call [phone], for an appointment.  WOUND CARE:  Once daily dressing changes to the back.  SPECIAL INSTRUCTIONS:  May shower once daily.  ACTIVITY:  No bending, stooping, squatting or lifting more than five pounds. She may be weightbearing as tolerated.  DISCHARGE MEDICATIONS:  1. Robaxin 500 mg p.o. q.8h. p.r.n. spasm.  2. Colace 100 mg p.o. b.i.d.  3. Actos 45 mg p.o. q.d.  4. Protonix 40 mg p.o. q.d.  5. Toprol XL 50 mg p.o. q.d.  6. Cozaar 50 mg p.o. q.d.  7. Hydrochlorothiazide 12.5 mg p.o. q.d.  8. Amaryl 2 mg p.o. b.i.d.  9. Estradiol 0.5 mg p.o. b.i.d. 10. Trinsicon one p.o. q.d. PC. 11. Percocet 5/325 one to two every four to six hours as needed for pain. 12. Ativan 0.5 mg p.o. q.8h. p.r.n. anxiety. 13. Ambien 10 mg p.o. q.h.s. p.r.n. sleep. 14. Nitroglycerin 0.4 mg sublingual every five minutes x3 p.r.n. 15. Lipitor.  CONDITION ON DISCHARGE:  Good and improved. Dictated by:   Marcie Bal Troncale, P.A.C. Attending Physician:  Herold Harms DD:  02/12/02 TD:  02/12/02 Job: [id number]

## 2011-04-30 NOTE — Cardiovascular Report (Signed)
[place]  Patient:    [name], [name]                         MRN: [id number] Proc. Date: 07/17/01 Adm. Date:  [id number] Attending:  Silvestre Mesi CC:         Cardiac Catheterization Laboratory   Cardiac Catheterization  PROCEDURES: 1. Left heart catheterization. 2. Coronary cineangiography. 3. Vein graft cineangiography. 4. Left internal mammary artery graft cineangiography. 5. Left ventricular cineangiography. 6. Abdominal aortogram with runoff in the right femoral artery. 7. Perclose of the right femoral artery.  INDICATIONS FOR PROCEDURE:  This 77 year old female with a long history of hypertension and coronary artery disease first had angioplasty of her proximal LAD in 1992.  She then developed severe left main coronary artery disease and had coronary artery bypass graft surgery in 1998.  She recently had the onset of anterior chest heaviness and a repeat treadmill exercise tolerance test was positive for significant ST segment depression where as the stress test had been normal yearly until now.  She was then scheduled for cardiac catheterization.  During her cardiac catheterization in 1998 we noted spontaneous dissection in her right iliac and common femoral artery with good flow distally.  We plan to restudy this area with this catheterization.  ANESTHESIA:  Local with 1% lidocaine.  DESCRIPTION OF PROCEDURE:  After signing an informed consent, the patient was premedicated with 50 mg of Benadryl intravenously and brought to the cardiac catheterization lab.  Her right groin was prepped and draped in a sterile fashion and anesthetized locally with 1% lidocaine.  A #6 French introducer sheath was inserted percutaneously into the right femoral artery.  The 6 [place] #4 Judkins coronary catheters were used to make injections into the native coronary arteries.  The right coronary catheter was used to make a mid stream injections into  the vein grafts to the obtuse marginal and ramus arteries.  The right coronary catheter was also used to make injections into the left internal mammary artery visualizing the left internal mammary graft to the LAD.  A 6 French pigtail catheter was used to measure pressures in the left ventricle and aorta and to make mid stream injections into the left ventricle and abdominal aorta.  Injections were also made in to the right iliac with runoff into the right common femoral artery.  The patient tolerated the procedure well and no complications were noted.  At the end of the procedure, the catheter and sheath were removed from the right femoral artery and hemostasis was easily obtained with a Perclose closure system.  MEDICATIONS GIVEN:  None.  HEMODYNAMIC DATA:  Left ventricular pressure 168/10-24, aortic pressure 168/75 with a mean of 109.  Left ventricular ejection fraction was measured at 63%. Also of note is her LATEX allergy and proper precautions were taken by using non-latex equipment.  CINE FINDINGS:  CORONARY CINEANGIOGRAPHY:  Left coronary artery:  The ostium and left main have segmental stenotic lesion involving a 70% stenosis from the ostium to the middle portion of the left main.  There is also a severe narrowing in the distal left main which extends into the bifurcation.  Left anterior descending:  The LAD has severe critical stenosis in its proximal segment with a segmental 90% stenosis throughout the proximal segment extending into the middle segment.  The LAD then becomes essentially normal and is well filled by the left internal mammary graft supplying the mid  and distal segment.  Circumflex coronary artery:  The circumflex coronary artery has a segmental stenosis in its proximal segment causing a 60-70% segmental narrowing from its takeoff until the large bifurcation of the AV groove and large obtuse marginal branch.  The large obtuse marginal branch is the site of  anastomosis of a vein graft.  Right coronary artery:  The right coronary artery appears normal and is a large dominant vessel supplying the posterior descending and posterolateral circulation to the left ventricle.  There is normal antegrade flow.  VEIN GRAFT CINEANGIOGRAPHY:  Obtuse marginal vein graft:  The proximal segment has a short area of plaque causing a 20-30% narrowing.  The remainder of this vein graft appears normal and there is a normal distal end-to-side anastomosis into the large obtuse marginal branch.  There is very good filling of the circumflex system through this graft.  There is also retrograde filling into the proximal circumflex and also into the ramus and LAD.  Ramus or optional diagonal vein graft:  This vein graft has a plaque in its proximal segment causing a segmental narrowing of approximately 50-60%.  There is antegrade flow with normal distal anastomosis and good flow into the ramus branch.  The tip of the catheter inside this vein graft caused mild dampening of the pressure.  There is both antegrade flow through the native coronary artery into this optional diagonal branch and also good flow through the vein graft.  Left internal mammary graft to the LAD:  This arterial graft appears normal and supplies very normal flow into the mid middle segment of the LAD.  The middle segment and distal segment are well-visualized and appear normal.  LEFT VENTRICULAR CINEANGIOGRAM:  The left ventricular chamber size and contractility appear normal.  The left ventricular wall thickness appears normal.  The mitral and aortic valves also appear normal.  ABDOMINAL AORTOGRAM:  The abdominal aorta and renal arteries appear normal. Runoff into the right iliac and femoral artery shows normal-appearing right common iliac and right external iliac artery.  The internal iliac on the right appears normal.  There is very normal rapid runoff into the common femoral artery.  The  common femoral artery has an atherosclerotic plaque causing a  mild 20-30% narrowing and there is mild calcification in this area.  FINAL DIAGNOSES:  1. Two-vessel coronary artery disease with severe left main, circumflex,     left anterior descending, and obtuse diagonal stenosis.  2. Moderate stenosis of the optional diagonal vein graft of 50-60% in the     proximal segment.  3. Mild stenosis 20% of the obtuse marginal vein graft, proximal segment.  4. Normal left internal mammary graft to the left anterior descending.  5. Normal right coronary artery.  6. Normal left ventricular function.  7. Normal mitral and aortic valves.  8. Mild stenosis of the right common femoral artery, 30% with normal     antegrade flow.  9. Normal abdominal aorta and renal arteries. 10. No evidence of prior dissection in the right common iliac, external     iliac and femoral arteries. 11. Successful Perclose of the right femoral artery.  DISPOSITION:  Will elect to continue medical treatment because of the bidirectional supply in the optional diagonal or ramus branch with antegrade flow and also flow through the vein graft.  If she becomes more clinically unstable, we would consider angioplasty stent placement in the vein graft to the ramus branch.  No further work-up is indicated of her peripheral artery disease  on the right. DD:  07/17/01 TD:  07/17/01 Job: [id number]

## 2011-04-30 NOTE — Op Note (Signed)
[facility]  Patient:    [name], [name] Visit Number: [id number] MRN: [id number]          Service Type: SUR Location: [id number] Attending Physician:  Lubertha South Dictated by:   Kerrin Champagne, M.D. Proc. Date: 02/07/02 Admit Date:  02/07/2002                             Operative Report  PREOPERATIVE DIAGNOSIS:  Lumbar spinal stenosis with lateral recess stenosis, L4-5 with grade 1 degenerative spondylolisthesis at the L4-5 level.  POSTOPERATIVE DIAGNOSIS:  Lumbar spinal stenosis with lateral recess stenosis, L4-5 with grade 1 degenerative spondylolisthesis at the L4-5 level.  PROCEDURE:  Central laminectomy L4-5 with decompression of bilateral L4 and L5 nerve roots and posterolateral fusion utilizing right iliac crest bone graft harvested through a separate fascial incision. The bone graft was augmented with local bone graft material as well. Operating room microscope was used during the procedure.  SURGEON:  Kerrin Champagne, M.D.  ASSISTANT:  Marcie Bal. Troncale, P.A.-C.  ANESTHESIA:  GOT, Dr. Council Mechanic.  ESTIMATED BLOOD LOSS:  200 cc.  DRAINS:  Foley to straight drain and Hemovac x 1.  BRIEF CLINICAL HISTORY:  The patient is a 77 year old female who has undergone previous laminectomy surgery for disc protrusion at L5-S1 almost 10 years ago. She has been treated for problems with chronic pain over the past ten years, intermittent chest pain, and has developed progressive neurogenic claudication over the last year and a half. Followup radiographs have demonstrated that progressive spondylolisthesis at the L4-5 level. The patients MRI study has demonstrated moderate spinal stenosis at the L4-5 level, felt to be worse in standing position with ambulation. She has a contrast allergy so post myelogram CT scan was not able to be performed. She was brought to the operating room to undergo a decompressive laminectomy at the L4-5 level  with posterolateral fusion without instrumentation. Instrumentation is not being carried out because of the patients age, the findings of stenosis being the most likely primary source of discomfort and mild osteoporosis, attempts at diminishing the patients morbidity.  INTRAOPERATIVE FINDINGS:  The patient was found to have severe lateral recess stenosis and foraminal entrapment of the L4 and L5 nerve roots due to hypertrophic ligamentum flavum and hypertrophic facet changes. These were present primarily at the L4-5 level.  DESCRIPTION OF PROCEDURE:  After adequate general anesthesia, with the patient in knee-chest position on Andrews frame, standard preoperative antibiotics and standard prep with Hibiclens and then alcohol. Draped with a Vi-drape. No iodine was used during the procedure. Alcohol was used to follow up with the Hibiclens prep. The incision ellipsing the old incision scar extending from L3 to S1 through the skin and subcutaneous layers after infiltration of Marcaine 0.5% with 1:200,000 epinephrine, the incision was carried sharply down to the lumbodorsal fascia. This was incised in the midline overlying the L3, L4, and L5 spinous processes and then Cobbs then used to elevate the paralumbar muscles off the posterior aspect of the lamina at L4 and L5. Kocher clamps were placed over the spinous process aspect at L4 and L5. Intraoperative myeloradiograph demonstrated the clamps at the L4 and L5 level. Cobbs were used to then further elevate the paralumbar muscles off these levels after marking the spinous processes with the Leksell rongeur. Electrocautery was then used to carefully perform facet resection of the synovium and its retinaculum bilateral at the L4-5 level  then carried the exposure out to the transverse process bilateral at L5. Debridement of the paralumbar muscles to allow for a trough for bone graft carried up along the posterolateral aspect of the spine to the  transverse process of L4. These areas were then packed. Right iliac crest bone graft harvest site was exposed through a subcutaneous approach to the right iliac crest. Incision made into the fascial layer overlying the right iliac crest posterior superior iliac spine using electrocautery. Cobbs were then used to elevate the subperiosteal _______ mediolateral exposing this area. Retractors placed. Bone graft was then harvested; both bicortical bone graft as well as cancellous bone graft, using osteotomes as well as gouges. Enough bone graft was harvested for single level and _______ posterolateral fusion. This area was then packed and the central incision reopened. McCullough retractors inserted. Central laminectomy was then performed at the L4-5 level, resecting the spinous process of L4 and small portion of the inferior aspect of the spinous process of L3. Posterior elements at L4 were then carefully thinned using Leksell rongeur. All of this bone was saved for additional augmentation of bone graft of the posterolateral fusion. Then 3 and 4 mm Kerrisons were used to perform central laminectomy, removing central portions of the lamina and then carrying the laminectomy out laterally bilaterally. The ligamentum flavum at the L3-4 level was debrided and this was carried out to ensure no lateral recess stenosis or hypertrophic flavum impinging on the thecal sac at this segment, and the L4 nerve root is identified. Operating room microscope was then draped and brought into the field and under the operating room microscope, a lateral recess was decompressed bilaterally, excising hypertrophic ligamentum flavum, excising approximately 40% or 50% of the facet joint bilaterally. The lateral recess was decompressed with careful retraction using a Derrico retractor over the thecal sac laterally on both sides and the medial aspect of the facet resected both sides almost 30% to 40% to decompress the  lateral recess out to the level of the medial aspect of the facet and the lateral aspect of the thecal sac. With this then, there was no further compression of the thecal sac on either  side and the L5 nerve roots were decompressed with foraminotomy performed over the L5 nerve root on both sides, such that a hockey stick probe could then passed down from the foramen identified on both sides by demonstrating no further compression here. With this then, irrigation was performed. Careful hemostasis with bone wax applied to bleeding cancellous bone surfaces, Gelfoam applied. Small amount of epidural bleeding noted on the right side just over the lateral aspect of the L4-5 level superior to the pedicle of L5. This was controlled using bipolar electrocautery and Gelfoam. Irrigation was performed. And then with the microscope, the posterolateral recess was carefully exposed and the paralumbar attachments to the transverse process were debrided. High speed burr and curettes were used to decorticate the transverse process of L4 and L5 and lateral aspect of the posterior articular process of L5 and of L4. Bone graft was then placed; first cancellous bone graft extending from the transverse process of L4 to L5 and then cortical cancellous bone graft strips and then on top of this, additional local bone graft obtained from the laminectomy. Left side was first done then the right side similarly done. A portion of Gelfoam was then placed over the posterolateral fusion mass trying to prevent bone graft from regressing back into the central laminectomy site with closure. Irrigation again was performed.  Careful inspection of the laminectomy site indicated that both L4 and L5 nerve roots showed no evidence of further compression within their foramen or centrally. The right iliac crest bone graft harvest site was carefully hemostased, irrigation performed, and bone wax applied to the bleeding cancellous bone  surfaces, Gelfoam, and then the fascial layer closed over the iliac crest on the right side with interrupted #1 Vicryl sutures, deep layers approximated with interrupted #1 Vicryl sutures, and more superficial layers also with #1 Vicryl sutures. a Hemovac was placed in the depth of the midline incision exiting over the right side above the pelvis. This was cut left over the right side of the lumbar spine posteriorly. Lumbodorsal fascia was then approximated in the midline with interrupted #1 Vicryl sutures, the deep subcutaneous layers with #1 Vicryl sutures and more superficial deep layers with interrupted 0 Vicryl sutures, then the superficial fatty layers with interrupted 2-0 Vicryl sutures and the skin closed with a running subcu stitch of 4-0 Vicryl. No Steri-Strips were applied because of the findings of latex within the Steri-Strips. Dressing of 4 x 4s and ABD pad affixed to the skin with hypofix tape. The patient was then reactivated following return to a supine position, extubated, and returned to the recovery room in satisfactory condition. All instruments, sponge counts were correct. Dictated by:   Kerrin Champagne, M.D. Attending Physician:  Lubertha South DD:  02/07/02 TD:  02/07/02 Job: [id number]

## 2011-04-30 NOTE — Discharge Summary (Signed)
[place]  Patient:    [name], [name] Visit Number: [id number] MRN: [id number]          Service Type: [facility] Location: [id number] Attending Physician:  Herold Harms Dictated by:   Junie Bame, P.A. Admit Date:  02/12/2002 Discharge Date: 02/21/2002   CC:         [name], M.D.  Bernadene Person, M.D.   Discharge Summary  DISCHARGE DIAGNOSES: 1. Status post posterior lumbar interbody fusion and central laminectomy and    L4-L5 secondary to spinal stenosis and spondylolisthesis with DJD disease.  2. History of hypertension. 3. History of diabetes mellitus. 4. History of cardiovascular disease. 5. Status post anemia.  HISTORY OF PRESENT ILLNESS:  The patient is a 77 year old black female with a long history of low back pain and leg pain that failed conservative management and diabetes mellitus.  She was admitted to [facility] on February 07, 2002, for PLIF and central laminectomy L4-L5 secondary to spinal stenosis and spondylolisthesis with degenerative disk disease by Dr. Vira Browns.  The patient currently has no DVT prophylaxis.  She is ambulating. Physical therapy report at this time indicates the patient can ambulate approximately 150 feet with supervision rolling walker and can transfer sit to stand on supervision level.  The hospital course is significant for elevated temperatures and anemia.  The patient was transferred to [facility].  PAST MEDICAL HISTORY:  Significant for cerebrovascular disease, hypertension, NIDD, GERD, increased cholesterol.  PAST SURGICAL HISTORY:  CABG, left hernia repair, lumbar laminectomy x2, cardiac catheterization in August 2002 and bladder tack.  MEDICATIONS PRIOR TO ADMISSION: 1. Amaryl 2 mg p.o. b.i.d. 2. Actos 45 mg p.o. q.d. 3. Ativan 0.5 mg p.r.n. 4. Prilosec 20 mg q.d. 5. Hyzaar 50 mg one q.d. 6. Toprol XL 50 mg q.d. 7.  Estrace 0.5 mg b.i.d. 8. Nitroglycerin.  ALLERGIES:  CONTRAST DYE.  PRIMARY CARE PHYSICIAN:  Bernadene Person, M.D.  SOCIAL HISTORY:  The patient lives alone in one level home with two steps to entry.  She takes care of her demented husband who is now in a skilled nursing facility.  REVIEW OF SYSTEMS:  Significant for reflux, joint swelling and joint pain.  HOSPITAL COURSE:  [name] was admitted to [facility] for comprehensive inpatient rehabilitation where she received more than three hours of physical therapy and occupational therapy daily.  Hospital course is significant for the following.  #1 - STATUS POST LUMBAR INTERBODY FUSION WITH LUMBAR LAMINECTOMY SECONDARY TO SPINAL STENOSIS:  The patient tolerated therapies very well during her nine day stay in rehabilitation.  She made significant improvement overall. Surgical incision had healed well.  Her main concern was pain management that occurred periodically in her back.  She was managed by OxyContin and Oxycodone p.r.n.  OxyContin and oxycodone relieved her pain.  She is also complaining of periodically muscle spasm.  She received Robaxin 500 mg p.o. q.8h. p.r.n. After the addition of Robaxin, muscle spasm did cease.  The patient had a hard time placing her back corset on in a supine position.  Therefore, it was recommended that she place this back corset in a sitting up position.  The patient did have occasional numbness in her left lower extremity and foot. The patient was started on Neurontin 300 mg p.o. q.h.s. on February 15, 2002. This has improved slightly but not totally relieved.  #2 - HYPERTENSION:  Blood pressure  remained stable throughout the entire stay in rehabilitation.  She did have one episode of hypertension.  Therefore, blood pressure medication was placed on parameters.  She remained on Toprol 50 mg p.o. q.d., Cozaar 50 mg p.o. q.d., HCTZ 25 mg p.o. q.d. and these medicines were  held periodically if systolic blood pressure was less than 110 and diastolic blood pressure less than 150.  #3 - DIABETES MELLITUS:  Sugars remained in fair control while she was in rehabilitation.  There were mild adjustments made to Amaryl.  The Amaryl was increased from 2 mg q.p.m. to 3 mg q.p.m. and she remained on 2 mg p.o. q.a.m. She also received Actos 40 mg p.o. q.d.  CBGs remained stable with these medications.  #4 - ANEMIA:  The patient remained on Trinsicon p.o. b.i.d. during the entire stay of rehabilitation.  Her latest hemoglobin was 10.2.  There were no other major complication that occurred while on rehabilitation.  Latest labs indicate that her white blood cell count was 5.6, platelet count was 359,000, hemoglobin 10.2, hematocrit 31.6.  Sodium 136, potassium 3.6, chloride 100, CO2 31, glucose 117, BUN 6, creatinine 0.9, AST 21, alkaline phosphatase 19.  Urine culture performed on February 12, 2002 came back no growth x1 day.  DISCHARGE PHYSICAL EXAMINATION:  At the time of discharge her vitals were stable.  Surgical incision was well healed.  No sign of erythema or infection. No palpable masses along the borders of the incisions.  The CBGs were running from 111, 109, 173 to 162.  Physical therapy report indicated the patient was ambulating modified independently greater than 200 feet with cane and could perform all ADLs modified independently.  DISCHARGE DISPOSITION:  She was discharged home with her daughter.  DISCHARGE MEDICATIONS:  1. Actos 45 mg p.o. q.d.  2. Prilosec 20 mg p.o. q.d.  3. Hyzaar, resume home doses.  4. Amaryl 2 mg in the morning and Amaryl 3 mg in the afternoon.  5. Estrace 0.5 mg twice daily.  6. Toprol XL 50 mg daily.  7. OxyContin 20 mg, follow taper.  8. Oxycodone 5 mg one to two tablets as needed for pain.  9. Neurontin 300 mg at night.  10. Robaxin 5 mg every eight hours as needed.  DISCHARGE INSTRUCTIONS:  She is to wear a back brace  when sitting up and when out of bed and walking.  No driving, no drinking.  Use can when walking.  Diet restrictions include low salt, low carbohydrates and check sugars at least twice daily.  FOLLOW-UP:  She will have [facility] for PT and OT.  She needs to follow up with Dr. Otelia Sergeant within two weeks and call for appointment.  She will see primary care doctor, Dr. Juleen China, within four to six weeks.  Follow up with Dr. Thomasena Edis as needed. Dictated by:   Junie Bame, P.A. Attending Physician:  Herold Harms DD:  02/21/02 TD:  02/23/02 Job: [id number]

## 2011-04-30 NOTE — Op Note (Signed)
[place]  Patient:    [name], [name]                         MRN: [id number] Proc. Date: 01/31/01 Adm. Date:  [id number] Attending:  Lubertha South                           Operative Report  PREOPERATIVE DIAGNOSIS:  Left knee torn lateral meniscus involving the anterior and lateral rim.  POSTOPERATIVE DIAGNOSIS:  Left knee torn lateral meniscus involving the anterior and lateral rim.  PROCEDURE:  Left knee arthroscopy with partial lateral meniscectomy.  SURGEON:  Kerrin Champagne, M.D.  ANESTHESIA:  GOT, David A. Ivin Booty, M.D.  ESTIMATED BLOOD LOSS:  75 cc.  DRAINS:  None.  BRIEF CLINICAL HISTORY:  The patient is a 77 year old female who has been experiencing pain in her left knee that has been worsening for the last one month.  She relates that she felt a pop in the knee as she was bending the knee and squatting.  She has been unable to straighten the knee or flex it completely since then.  She underwent an MR study, which demonstrates a torn lateral meniscus involving the anterior and lateral rim.  A bucket-handle partial appearance to this.  She has a history of diabetes and previous coronary artery bypass, and she is scheduled for outpatient surgery in [facility] main hospital.  INTRAOPERATIVE FINDINGS:  A torn lateral meniscus involving the middle portion of the anterior and lateral rim of the lateral meniscus.  This was treated with partial lateral meniscectomy involving the anterior lateral one-third of the lateral meniscus.  DESCRIPTION OF PROCEDURE:  After adequate general anesthesia, left lower extremity prepped with Duraprep solution, draped in the usual manner, care taken not to use any Latex in the patients prep or in the draping procedure. A tourniquet about the upper thigh, latex-free.  Coban that was latex-free was used as well.  The areas for inflow portals were each inflated subcutaneously with Marcaine 0.5% with  1:200,000 epinephrine in the superomedial quadrant, inferomedial, and inferolateral quadrants anteriorly.  Stab incision made into the anterior superior medial quadrant of the knee.  A trocar used to widen the opening of the knee, and then the inflow cannula was placed into the superomedial portal.  Knee inflated with irrigant solution.  Stab incision then made into the lateral joint surface anterior to the expected joint line surface as found with the needle for inflation of the subcutaneous layers. The arthroscope then introduced, sleeve introduced through the anterior lateral inferior portal.  Examination in the suprapatellar pouch demonstrates some arthrosis changes involving the condyle superiorly, the patellofemoral joint showing grade 2-3 changes.  The medial joint line demonstrated no particular significant abnormalities, no significant arthrosis changes.  The intercondylar notch, ACL was intact.  The lateral meniscus demonstrated frayed edges along the anterior rim and a tear involving the midsubstance anterolaterally.  This was treated by making a stab incision in the medial compartment and introducing a probe and probing this area.  Then a 3.5 mm and then 4 mm, then 5 mm shaver was used to shave this area.  Then using baskets, this was further debrided back to the rim of the meniscal-capsular junction, removing that section that had torn out and debriding appropriately using the high-speed shavers.  The Cuda 5 mm was eventually used for further debridement in this area.  Carefully the edges of the meniscus that had been trimmed were smoothed.  With this, then, irrigation was performed within the knee joint, and all loose debris was removed from the lateral compartment.  Examination of the remaining portion of the knee demonstrated no further debris present.  A small bleeder in the region of the geniculate artery that was present, and this was cauterized and did not demonstrate any  active bleeding at 80 mmHg pressure within the knee.  With this, then, irrigation was removed from the knee joint.  Single 4-0 nylon suture was used to close each of the portals. Adaptic was applied with 4 x 4s, ABD pad, fixed to the skin with Kerlix and then Coban applied.  Latex-free Coban was used.  The patient was then reactivated, returned to the recovery room in satisfactory condition.  Because of her latex allergy, a knee immobilizer could not be used.  The patient is to be discharged home today, to be seen back in the office in two weeks for follow-up.  She can remove her dressing at four days out and apply Band-Aids. We will probably see her back in the office in the next two days for wound check. DD:  01/31/01 TD:  01/31/01 Job: [id number]

## 2011-04-30 NOTE — Op Note (Signed)
NAMESICILY, ZARAGOZA                  ACCOUNT NO.:  [id number]   MEDICAL RECORD NO.:  [id number]          PATIENT TYPE:  AMB   LOCATION:  ENDO                         FACILITY:  [facility]   PHYSICIAN:  Georgiana Spinner, M.D.    DATE OF BIRTH:  11-29-1933   DATE OF PROCEDURE:  06/25/2005  DATE OF DISCHARGE:                                 OPERATIVE REPORT   PROCEDURE:  Upper endoscopy.   INDICATIONS:  Hemoccult positivity.   ANESTHESIA:  Demerol 60, Versed 6 mg.   PROCEDURE:  With the patient mildly sedated in the left lateral decubitus  position, the Olympus videoscopic endoscope was inserted in the mouth,  passed under direct vision through the esophagus which appeared normal, into  the stomach, and we saw blood flecks scattered throughout the stomach.  The  fundus, body, antrum, duodenal bulb, second portion of duodenum were  visualized.  From this point, the endoscope was slowly withdrawn taking  circumferential views of duodenal mucosa until the endoscope had been pulled  back in the stomach and placed in retroflexion to view the stomach from  below.  The endoscope was then straightened and withdrawn, taking  circumferential views of the remaining gastric and esophageal mucosa,  stopping to photograph some nodularity seen in the antrum and erythema seen  in the body and fundus.  The endoscope was withdrawn.  The patient's vital  signs and pulse oximeter remained stable.  The patient tolerated the  procedure well without apparent complications.   FINDINGS:  Nodularity of antrum and erythema of fundus and body of stomach,  biopsies taken.  Await biopsy report.  The patient will call me for results  and follow-up with me as an outpatient.  Proceed to colonoscopy as planned.       GMO/MEDQ  D:  06/25/2005  T:  06/25/2005  Job:  [id number]

## 2011-04-30 NOTE — Op Note (Signed)
[name], [name]                  ACCOUNT NO.:  [id number]   MEDICAL RECORD NO.:  [id number]          PATIENT TYPE:  OIB   LOCATION:  NA                           FACILITY:  [facility]   PHYSICIAN:  Jefry H. Pollyann Kennedy, MD     DATE OF BIRTH:  [birth year]   DATE OF PROCEDURE:  10/27/2004  DATE OF DISCHARGE:                                 OPERATIVE REPORT   PREOPERATIVE DIAGNOSIS:  Left ethmoid and maxillary sinusitis with left  ethmoid mucocele.   POSTOPERATIVE DIAGNOSIS:  Left ethmoid and maxillary sinusitis with left  ethmoid mucocele.   PROCEDURE:  1.  Left endoscopic anterior ethmoidectomy.  2.  Left endoscopic maxillary antrostomy.   SURGEON:  Jefry H. Pollyann Kennedy, M.D.   ANESTHESIA:  General endotracheal anesthesia.   COMPLICATIONS:  None.   ESTIMATED BLOOD LOSS:  Minimal.   FINDINGS:  Purulent secretions filling the left maxillary sinus with  obstruction of the left maxillary sinus secondary to the ethmoid mucocele.  The ethmoid mucocele was an anterior cell probably the bullae that contained  a very thick, granular sticky type of debris that was tan in color.  This  was sent for pathologic evaluation as well as culture and sensitivity  testing including fungal culture.   REFERRING PHYSICIAN:  Brooke Bonito, M.D.   HISTORY:  This is a 76 year old with a history of chronic sinus infection  that has failed to clear on antibiotics.  There is evidence of expansile  mucocele in the ethmoid on CT scan.  The risks, benefits, alternatives, and  complications of the procedure were explained to the patient who seemed to  understand and agreed to the surgery.   PROCEDURE:  The patient was taken to the operating room and placed on the  operating table in supine position.  Following induction of general  endotracheal anesthesia, the patient was draped in a standard fashion.  Oxymetazoline spray was used preoperatively in the nasal cavity.  1%  Xylocaine with epinephrine was infiltrated into  the superior and posterior  attachments of the middle turbinates and the lateral nasal wall.   1.  Left anterior ethmoidectomy.  The uncinate process was resected using a      sickle knife and Weil-Blakesley forceps.  This exposed the bulla with a      very thin wall which was opened and a large amount of debris was      suctioned out and sent for culture.  The microcarrier was used to      perform a complete anterior ethmoidectomy keeping the lamina papyracea      and the fovea intact as well as the attachments to the middle turbinate.      There was obvious expansion of this single cell all the way to the fovea      and to the lamina of papyracea without any dehiscence noted.   1.  Left endoscopic maxillary antrostomy.  The natural ostium was identified      and enlarged using back bleeding forceps and the microdebriderr.  A      large amount  of purulent secretions were obtained.  A sample of the      ethmoid mucocele was sent in a trap for culture and the remaining      specimen was sent for pathologic evaluation.  There was some granular      polypoid tissue within the infundibulum but no suspicious neoplasm or      anything that appeared to be inverting papilloma.  Afrin soaked pledgets      were placed in the ethmoid cavity until the patient was awakened from      anesthesia at which time they were removed.  The patient was sent to the      recovery room in stable condition.      Jefr   JHR/MEDQ  D:  10/27/2004  T:  10/27/2004  Job:  [id number]   cc:   Brooke Bonito, M.D.  [address]  [place]  [place] [address]  Fax: [phone]

## 2011-04-30 NOTE — Procedures (Signed)
[facility]  Patient:    [name], [name]                         MRN: [id number] Proc. Date: 10/14/00 Adm. Date:  [id number] Attending:  Sabino Gasser                           Procedure Report  PROCEDURE:  Upper endoscopy.  SURGEON:  Sabino Gasser, M.D.  INDICATIONS:  Rectal bleeding.  ANESTHESIA:  Demerol 70 and Versed 7 mg.  DESCRIPTION OF PROCEDURE:  With the patient mildly sedated in the left lateral decubitus position, the Olympus videoscopic endoscope was inserted in the mouth and passed under direct vision through the esophagus which appeared normal into the fundus and body which appeared normal to the antrum which showed erosion, photographed and biopsied.  We entered into the duodenal bulb and second portion of the duodenum, both of which appeared normal and photographed.  From this point, the endoscope was slowly withdrawn, taking circumferential views of the entire duodenal mucosa until the endoscope had been pulled in the stomach, placed in retroflexion to view the stomach from below.  The endoscope was then straightened and withdrawn, taking circumferential views of the remaining gastric and esophageal mucosa which otherwise appeared normal.  The patients vital signs and pulse oximeter remained stable.  The patient tolerated the procedure well and without apparent complications.  FINDINGS:  Erosions, ulcerations of the antrum, and distal body and stomach. Await biopsy report.  The patient will call me for results and follow up with me as an outpatient.  Proceed to colonoscopy. DD:  10/14/00 TD:  10/15/00 Job: [id number]

## 2011-04-30 NOTE — Op Note (Signed)
NAMETYLESHA, GIBEAULT NO.:  [id number]   MEDICAL RECORD NO.:  [id number]          PATIENT TYPE:  AMB   LOCATION:  ENDO                         [facility]ACILITY[facility]  [facility]   PHYSICIAN:  Georgiana Spinner, M.D.    DATE OF BIRTH:  [birth year]   DATE OF PROCEDURE:  06/25/2005  DATE OF DISCHARGE:                                 OPERATIVE REPORT   PROCEDURE:  Colonoscopy.   INDICATIONS:  Hemoccult positivity, diarrhea.   ANESTHESIA:  Demerol 40, Versed 4 mg.   PROCEDURE:  With the patient mildly sedated in the left lateral decubitus  position, the Olympus videoscopic colonoscope was inserted into the rectum  and passed under direct vision to the cecum identified by ileocecal valve  and appendiceal orifice, both of which were photographed.  From this point,  the colonoscope was slowly withdrawn, taking circumferential views of the  colonic mucosa, stopping to photograph normal rectum on direct view and  hemorrhoids on retroflexed view.  The endoscope was straightened and  withdrawn.  Random biopsies were taken along the way of normal mucosa.  The  patient's vital signs and pulse oximeter remained stable.  The patient  tolerated the procedure well without apparent complications.   FINDINGS:  Internal hemorrhoids, otherwise unremarkable examination with  random biopsies taken because of diarrhea.  Await biopsy report.  The  patient will call me for results and follow-up with me as an outpatient.       GMO/MEDQ  D:  06/25/2005  T:  06/25/2005  Job:  [id number]

## 2011-04-30 NOTE — Procedures (Signed)
[facility]  Patient:    [name], [name]                         MRN: [id number] Proc. Date: 10/14/00 Adm. Date:  [id number] Attending:  Sabino Gasser                           Procedure Report  PROCEDURE:  Colonoscopy.  SURGEON:  Sabino Gasser, M.D.  INDICATIONS:  Rectal bleeding.  ANESTHESIA:  Demerol 30 mg and Versed 3 mg additionally.  DESCRIPTION OF PROCEDURE:  With the patient mildly sedated in the left lateral decubitus position, the Olympus videoscopic colonoscope was inserted in the rectum and passed under direct vision to the cecum, identified by the ileocecal valve and appendiceal orifice, both of which were photographed.  We entered into the terminal ileum.  ________ it appeared normal.  From this point, the colonoscope was slowly withdrawn taking circumferential views of the entire colonic mucosa, stopping to photograph where diverticula seen along the way throughout the colon.  The endoscope was pulled back to the rectum which appeared normal on direct view and showed hemorrhoids on retroflexed view.  The endoscope was straightened and withdrawn.  The patients vital signs and pulse oximeter remained stable.  The patient tolerated the procedure well without apparent complications.  FINDINGS:  Rare scattered diverticulosis and internal hemorrhoids, otherwise unremarkable examination.  PLAN:  See endoscopy note for further details. DD:  10/14/00 TD:  10/15/00 Job: [id number]

## 2011-10-05 ENCOUNTER — Emergency Department (HOSPITAL_COMMUNITY): Payer: Medicare Other

## 2011-10-05 ENCOUNTER — Emergency Department (HOSPITAL_COMMUNITY)
Admission: EM | Admit: 2011-10-05 | Discharge: 2011-10-06 | Disposition: A | Discharge status: Home / Self Care | Payer: Medicare Other | Attending: Emergency Medicine | Admitting: Emergency Medicine

## 2011-10-05 DIAGNOSIS — G8929 Other chronic pain: Secondary | ICD-10-CM | POA: Insufficient documentation

## 2011-10-05 DIAGNOSIS — Z79899 Other long term (current) drug therapy: Secondary | ICD-10-CM | POA: Insufficient documentation

## 2011-10-05 DIAGNOSIS — N390 Urinary tract infection, site not specified: Secondary | ICD-10-CM | POA: Insufficient documentation

## 2011-10-05 DIAGNOSIS — E785 Hyperlipidemia, unspecified: Secondary | ICD-10-CM | POA: Insufficient documentation

## 2011-10-05 DIAGNOSIS — I2510 Atherosclerotic heart disease of native coronary artery without angina pectoris: Secondary | ICD-10-CM | POA: Insufficient documentation

## 2011-10-05 DIAGNOSIS — R350 Frequency of micturition: Secondary | ICD-10-CM | POA: Insufficient documentation

## 2011-10-05 DIAGNOSIS — R079 Chest pain, unspecified: Secondary | ICD-10-CM | POA: Insufficient documentation

## 2011-10-05 DIAGNOSIS — M549 Dorsalgia, unspecified: Secondary | ICD-10-CM | POA: Insufficient documentation

## 2011-10-05 DIAGNOSIS — E119 Type 2 diabetes mellitus without complications: Secondary | ICD-10-CM | POA: Insufficient documentation

## 2011-10-05 DIAGNOSIS — I10 Essential (primary) hypertension: Secondary | ICD-10-CM | POA: Insufficient documentation

## 2011-10-05 DIAGNOSIS — R109 Unspecified abdominal pain: Secondary | ICD-10-CM | POA: Insufficient documentation

## 2011-10-05 LAB — DIFFERENTIAL
Basophils Absolute: 0 10*3/uL (ref 0.0–0.1)
Basophils Relative: 0 % (ref 0–1)
Eosinophils Absolute: 0.2 10*3/uL (ref 0.0–0.7)
Neutro Abs: 3.2 10*3/uL (ref 1.7–7.7)
Neutrophils Relative %: 55 % (ref 43–77)

## 2011-10-05 LAB — COMPREHENSIVE METABOLIC PANEL
ALT: 13 U/L (ref 0–35)
AST: 19 U/L (ref 0–37)
Alkaline Phosphatase: 53 U/L (ref 39–117)
Calcium: 10.2 mg/dL (ref 8.4–10.5)
GFR calc Af Amer: 60 mL/min — ABNORMAL LOW (ref >90)
Glucose, Bld: 88 mg/dL (ref 70–99)
Potassium: 4.4 mEq/L (ref 3.5–5.1)
Sodium: 140 mEq/L (ref 135–145)
Total Protein: 7.9 g/dL (ref 6.0–8.3)

## 2011-10-05 LAB — URINE MICROSCOPIC-ADD ON

## 2011-10-05 LAB — URINALYSIS, ROUTINE W REFLEX MICROSCOPIC
Glucose, UA: NEGATIVE mg/dL
Hgb urine dipstick: NEGATIVE
Ketones, ur: 15 mg/dL — AB
Protein, ur: NEGATIVE mg/dL
pH: 5.5 (ref 5.0–8.0)

## 2011-10-05 LAB — CBC
Hemoglobin: 12.2 g/dL (ref 12.0–15.0)
MCH: 27.7 pg (ref 26.0–34.0)
MCHC: 32 g/dL (ref 30.0–36.0)
Platelets: 298 10*3/uL (ref 150–400)
RBC: 4.4 MIL/uL (ref 3.87–5.11)

## 2011-10-06 ENCOUNTER — Encounter (HOSPITAL_COMMUNITY): Payer: Self-pay | Admitting: Radiology

## 2011-11-15 ENCOUNTER — Other Ambulatory Visit: Payer: Self-pay | Admitting: Endocrinology

## 2011-11-17 ENCOUNTER — Ambulatory Visit
Admission: RE | Admit: 2011-11-17 | Discharge: 2011-11-17 | Disposition: A | Discharge status: Home / Self Care | Payer: Medicare Other | Source: Ambulatory Visit | Attending: Endocrinology | Admitting: Endocrinology

## 2011-12-15 DIAGNOSIS — R1011 Right upper quadrant pain: Secondary | ICD-10-CM | POA: Diagnosis not present

## 2011-12-15 DIAGNOSIS — I10 Essential (primary) hypertension: Secondary | ICD-10-CM | POA: Diagnosis not present

## 2011-12-21 DIAGNOSIS — E789 Disorder of lipoprotein metabolism, unspecified: Secondary | ICD-10-CM | POA: Diagnosis not present

## 2011-12-21 DIAGNOSIS — E119 Type 2 diabetes mellitus without complications: Secondary | ICD-10-CM | POA: Diagnosis not present

## 2011-12-21 DIAGNOSIS — I10 Essential (primary) hypertension: Secondary | ICD-10-CM | POA: Diagnosis not present

## 2011-12-22 DIAGNOSIS — Z1231 Encounter for screening mammogram for malignant neoplasm of breast: Secondary | ICD-10-CM | POA: Diagnosis not present

## 2012-01-17 DIAGNOSIS — I10 Essential (primary) hypertension: Secondary | ICD-10-CM | POA: Diagnosis not present

## 2012-01-17 DIAGNOSIS — I2510 Atherosclerotic heart disease of native coronary artery without angina pectoris: Secondary | ICD-10-CM | POA: Diagnosis not present

## 2012-01-17 DIAGNOSIS — E119 Type 2 diabetes mellitus without complications: Secondary | ICD-10-CM | POA: Diagnosis not present

## 2012-01-18 ENCOUNTER — Other Ambulatory Visit: Payer: Self-pay | Admitting: Gastroenterology

## 2012-01-18 DIAGNOSIS — R195 Other fecal abnormalities: Secondary | ICD-10-CM | POA: Diagnosis not present

## 2012-01-18 DIAGNOSIS — K6289 Other specified diseases of anus and rectum: Secondary | ICD-10-CM | POA: Diagnosis not present

## 2012-01-18 DIAGNOSIS — R1031 Right lower quadrant pain: Secondary | ICD-10-CM

## 2012-01-18 DIAGNOSIS — R1032 Left lower quadrant pain: Secondary | ICD-10-CM | POA: Diagnosis not present

## 2012-01-20 DIAGNOSIS — N949 Unspecified condition associated with female genital organs and menstrual cycle: Secondary | ICD-10-CM | POA: Diagnosis not present

## 2012-01-24 ENCOUNTER — Ambulatory Visit
Admission: RE | Admit: 2012-01-24 | Discharge: 2012-01-24 | Disposition: A | Discharge status: Home / Self Care | Payer: Medicare Other | Source: Ambulatory Visit | Attending: Gastroenterology | Admitting: Gastroenterology

## 2012-01-24 DIAGNOSIS — N8111 Cystocele, midline: Secondary | ICD-10-CM | POA: Diagnosis not present

## 2012-01-24 DIAGNOSIS — R1031 Right lower quadrant pain: Secondary | ICD-10-CM

## 2012-01-24 MED ORDER — GADOBENATE DIMEGLUMINE 529 MG/ML IV SOLN
15.0000 mL | Freq: Once | INTRAVENOUS | Status: AC | PRN
  Administered 2012-01-24: 15 mL via INTRAVENOUS

## 2012-01-25 ENCOUNTER — Other Ambulatory Visit: Payer: Self-pay | Admitting: Gastroenterology

## 2012-01-25 DIAGNOSIS — N823 Fistula of vagina to large intestine: Secondary | ICD-10-CM

## 2012-01-25 DIAGNOSIS — H21239 Degeneration of iris (pigmentary), unspecified eye: Secondary | ICD-10-CM | POA: Diagnosis not present

## 2012-01-25 DIAGNOSIS — R1031 Right lower quadrant pain: Secondary | ICD-10-CM

## 2012-01-25 DIAGNOSIS — H01009 Unspecified blepharitis unspecified eye, unspecified eyelid: Secondary | ICD-10-CM | POA: Diagnosis not present

## 2012-01-26 ENCOUNTER — Inpatient Hospital Stay: Admission: RE | Admit: 2012-01-26 | Payer: Medicare Other | Source: Ambulatory Visit

## 2012-02-14 DIAGNOSIS — K589 Irritable bowel syndrome without diarrhea: Secondary | ICD-10-CM | POA: Diagnosis not present

## 2012-02-22 DIAGNOSIS — N949 Unspecified condition associated with female genital organs and menstrual cycle: Secondary | ICD-10-CM | POA: Diagnosis not present

## 2012-02-25 DIAGNOSIS — B351 Tinea unguium: Secondary | ICD-10-CM | POA: Diagnosis not present

## 2012-02-25 DIAGNOSIS — M79609 Pain in unspecified limb: Secondary | ICD-10-CM | POA: Diagnosis not present

## 2012-02-29 DIAGNOSIS — N393 Stress incontinence (female) (male): Secondary | ICD-10-CM | POA: Diagnosis not present

## 2012-03-03 DIAGNOSIS — E119 Type 2 diabetes mellitus without complications: Secondary | ICD-10-CM | POA: Diagnosis not present

## 2012-03-03 DIAGNOSIS — H01009 Unspecified blepharitis unspecified eye, unspecified eyelid: Secondary | ICD-10-CM | POA: Diagnosis not present

## 2012-03-08 DIAGNOSIS — E119 Type 2 diabetes mellitus without complications: Secondary | ICD-10-CM | POA: Diagnosis not present

## 2012-03-08 DIAGNOSIS — E789 Disorder of lipoprotein metabolism, unspecified: Secondary | ICD-10-CM | POA: Diagnosis not present

## 2012-03-08 DIAGNOSIS — I10 Essential (primary) hypertension: Secondary | ICD-10-CM | POA: Diagnosis not present

## 2012-04-04 DIAGNOSIS — E789 Disorder of lipoprotein metabolism, unspecified: Secondary | ICD-10-CM | POA: Diagnosis not present

## 2012-04-10 DIAGNOSIS — I10 Essential (primary) hypertension: Secondary | ICD-10-CM | POA: Diagnosis not present

## 2012-04-10 DIAGNOSIS — E119 Type 2 diabetes mellitus without complications: Secondary | ICD-10-CM | POA: Diagnosis not present

## 2012-04-10 DIAGNOSIS — E789 Disorder of lipoprotein metabolism, unspecified: Secondary | ICD-10-CM | POA: Diagnosis not present

## 2012-04-10 DIAGNOSIS — R109 Unspecified abdominal pain: Secondary | ICD-10-CM | POA: Diagnosis not present

## 2012-04-26 DIAGNOSIS — N816 Rectocele: Secondary | ICD-10-CM | POA: Diagnosis not present

## 2012-04-26 DIAGNOSIS — N949 Unspecified condition associated with female genital organs and menstrual cycle: Secondary | ICD-10-CM | POA: Diagnosis not present

## 2012-04-26 DIAGNOSIS — N8111 Cystocele, midline: Secondary | ICD-10-CM | POA: Diagnosis not present

## 2012-05-03 DIAGNOSIS — K623 Rectal prolapse: Secondary | ICD-10-CM | POA: Diagnosis not present

## 2012-05-03 DIAGNOSIS — K648 Other hemorrhoids: Secondary | ICD-10-CM | POA: Diagnosis not present

## 2012-05-11 DIAGNOSIS — R300 Dysuria: Secondary | ICD-10-CM | POA: Diagnosis not present

## 2012-05-12 DIAGNOSIS — R300 Dysuria: Secondary | ICD-10-CM | POA: Diagnosis not present

## 2012-05-17 DIAGNOSIS — K648 Other hemorrhoids: Secondary | ICD-10-CM | POA: Diagnosis not present

## 2012-05-19 DIAGNOSIS — N390 Urinary tract infection, site not specified: Secondary | ICD-10-CM | POA: Diagnosis not present

## 2012-05-19 DIAGNOSIS — I10 Essential (primary) hypertension: Secondary | ICD-10-CM | POA: Diagnosis not present

## 2012-05-19 DIAGNOSIS — E789 Disorder of lipoprotein metabolism, unspecified: Secondary | ICD-10-CM | POA: Diagnosis not present

## 2012-05-31 DIAGNOSIS — K6289 Other specified diseases of anus and rectum: Secondary | ICD-10-CM | POA: Diagnosis not present

## 2012-05-31 DIAGNOSIS — K648 Other hemorrhoids: Secondary | ICD-10-CM | POA: Diagnosis not present

## 2012-06-01 DIAGNOSIS — IMO0001 Reserved for inherently not codable concepts without codable children: Secondary | ICD-10-CM | POA: Diagnosis not present

## 2012-06-05 DIAGNOSIS — N949 Unspecified condition associated with female genital organs and menstrual cycle: Secondary | ICD-10-CM | POA: Diagnosis not present

## 2012-06-06 DIAGNOSIS — I10 Essential (primary) hypertension: Secondary | ICD-10-CM | POA: Diagnosis not present

## 2012-06-06 DIAGNOSIS — E119 Type 2 diabetes mellitus without complications: Secondary | ICD-10-CM | POA: Diagnosis not present

## 2012-06-06 DIAGNOSIS — R109 Unspecified abdominal pain: Secondary | ICD-10-CM | POA: Diagnosis not present

## 2012-06-07 ENCOUNTER — Other Ambulatory Visit: Payer: Self-pay | Admitting: Obstetrics & Gynecology

## 2012-06-07 DIAGNOSIS — N736 Female pelvic peritoneal adhesions (postinfective): Secondary | ICD-10-CM | POA: Diagnosis not present

## 2012-06-07 DIAGNOSIS — D282 Benign neoplasm of uterine tubes and ligaments: Secondary | ICD-10-CM | POA: Diagnosis not present

## 2012-06-07 DIAGNOSIS — D279 Benign neoplasm of unspecified ovary: Secondary | ICD-10-CM | POA: Diagnosis not present

## 2012-06-07 DIAGNOSIS — N949 Unspecified condition associated with female genital organs and menstrual cycle: Secondary | ICD-10-CM | POA: Diagnosis not present

## 2012-08-24 DIAGNOSIS — N952 Postmenopausal atrophic vaginitis: Secondary | ICD-10-CM | POA: Diagnosis not present

## 2012-08-24 DIAGNOSIS — N390 Urinary tract infection, site not specified: Secondary | ICD-10-CM | POA: Diagnosis not present

## 2012-08-24 DIAGNOSIS — B373 Candidiasis of vulva and vagina: Secondary | ICD-10-CM | POA: Diagnosis not present

## 2012-08-30 ENCOUNTER — Encounter: Payer: Self-pay | Admitting: Gastroenterology

## 2012-09-14 DIAGNOSIS — I10 Essential (primary) hypertension: Secondary | ICD-10-CM | POA: Diagnosis not present

## 2012-09-14 DIAGNOSIS — S6990XA Unspecified injury of unspecified wrist, hand and finger(s), initial encounter: Secondary | ICD-10-CM | POA: Diagnosis not present

## 2012-09-14 DIAGNOSIS — E119 Type 2 diabetes mellitus without complications: Secondary | ICD-10-CM | POA: Diagnosis not present

## 2012-09-14 DIAGNOSIS — E789 Disorder of lipoprotein metabolism, unspecified: Secondary | ICD-10-CM | POA: Diagnosis not present

## 2012-09-14 DIAGNOSIS — M79609 Pain in unspecified limb: Secondary | ICD-10-CM | POA: Diagnosis not present

## 2012-09-18 DIAGNOSIS — E119 Type 2 diabetes mellitus without complications: Secondary | ICD-10-CM | POA: Diagnosis not present

## 2012-09-22 DIAGNOSIS — G589 Mononeuropathy, unspecified: Secondary | ICD-10-CM | POA: Diagnosis not present

## 2012-09-22 DIAGNOSIS — E1149 Type 2 diabetes mellitus with other diabetic neurological complication: Secondary | ICD-10-CM | POA: Diagnosis not present

## 2012-09-26 DIAGNOSIS — H2640 Unspecified secondary cataract: Secondary | ICD-10-CM | POA: Diagnosis not present

## 2012-09-26 DIAGNOSIS — H43819 Vitreous degeneration, unspecified eye: Secondary | ICD-10-CM | POA: Diagnosis not present

## 2012-09-26 DIAGNOSIS — H01009 Unspecified blepharitis unspecified eye, unspecified eyelid: Secondary | ICD-10-CM | POA: Diagnosis not present

## 2012-09-26 DIAGNOSIS — H52209 Unspecified astigmatism, unspecified eye: Secondary | ICD-10-CM | POA: Diagnosis not present

## 2012-10-02 DIAGNOSIS — I10 Essential (primary) hypertension: Secondary | ICD-10-CM | POA: Diagnosis not present

## 2012-10-02 DIAGNOSIS — E119 Type 2 diabetes mellitus without complications: Secondary | ICD-10-CM | POA: Diagnosis not present

## 2012-10-12 DIAGNOSIS — I10 Essential (primary) hypertension: Secondary | ICD-10-CM | POA: Diagnosis not present

## 2012-10-13 DIAGNOSIS — I739 Peripheral vascular disease, unspecified: Secondary | ICD-10-CM | POA: Diagnosis not present

## 2012-10-13 DIAGNOSIS — I70219 Atherosclerosis of native arteries of extremities with intermittent claudication, unspecified extremity: Secondary | ICD-10-CM | POA: Diagnosis not present

## 2012-10-19 DIAGNOSIS — E119 Type 2 diabetes mellitus without complications: Secondary | ICD-10-CM | POA: Diagnosis not present

## 2012-10-19 DIAGNOSIS — E789 Disorder of lipoprotein metabolism, unspecified: Secondary | ICD-10-CM | POA: Diagnosis not present

## 2012-10-19 DIAGNOSIS — B353 Tinea pedis: Secondary | ICD-10-CM | POA: Diagnosis not present

## 2012-10-19 DIAGNOSIS — I2510 Atherosclerotic heart disease of native coronary artery without angina pectoris: Secondary | ICD-10-CM | POA: Diagnosis not present

## 2012-12-22 DIAGNOSIS — I2510 Atherosclerotic heart disease of native coronary artery without angina pectoris: Secondary | ICD-10-CM | POA: Diagnosis not present

## 2012-12-22 DIAGNOSIS — I739 Peripheral vascular disease, unspecified: Secondary | ICD-10-CM | POA: Diagnosis not present

## 2012-12-22 DIAGNOSIS — Z951 Presence of aortocoronary bypass graft: Secondary | ICD-10-CM | POA: Diagnosis not present

## 2012-12-26 DIAGNOSIS — E119 Type 2 diabetes mellitus without complications: Secondary | ICD-10-CM | POA: Diagnosis not present

## 2012-12-26 DIAGNOSIS — I739 Peripheral vascular disease, unspecified: Secondary | ICD-10-CM | POA: Diagnosis not present

## 2012-12-26 DIAGNOSIS — E789 Disorder of lipoprotein metabolism, unspecified: Secondary | ICD-10-CM | POA: Diagnosis not present

## 2013-01-01 ENCOUNTER — Other Ambulatory Visit: Payer: Self-pay | Admitting: Cardiology

## 2013-01-01 ENCOUNTER — Ambulatory Visit
Admission: RE | Admit: 2013-01-01 | Discharge: 2013-01-01 | Disposition: A | Discharge status: Home / Self Care | Payer: Medicare Other | Source: Ambulatory Visit | Attending: Cardiology | Admitting: Cardiology

## 2013-01-01 DIAGNOSIS — Z01811 Encounter for preprocedural respiratory examination: Secondary | ICD-10-CM

## 2013-01-01 DIAGNOSIS — J9819 Other pulmonary collapse: Secondary | ICD-10-CM | POA: Diagnosis not present

## 2013-01-18 DIAGNOSIS — N390 Urinary tract infection, site not specified: Secondary | ICD-10-CM | POA: Diagnosis not present

## 2013-01-18 DIAGNOSIS — I10 Essential (primary) hypertension: Secondary | ICD-10-CM | POA: Diagnosis not present

## 2013-01-18 DIAGNOSIS — E789 Disorder of lipoprotein metabolism, unspecified: Secondary | ICD-10-CM | POA: Diagnosis not present

## 2013-01-18 DIAGNOSIS — E119 Type 2 diabetes mellitus without complications: Secondary | ICD-10-CM | POA: Diagnosis not present

## 2013-02-01 DIAGNOSIS — M79609 Pain in unspecified limb: Secondary | ICD-10-CM | POA: Diagnosis not present

## 2013-02-01 DIAGNOSIS — I10 Essential (primary) hypertension: Secondary | ICD-10-CM | POA: Diagnosis not present

## 2013-02-01 DIAGNOSIS — E789 Disorder of lipoprotein metabolism, unspecified: Secondary | ICD-10-CM | POA: Diagnosis not present

## 2013-02-01 DIAGNOSIS — E119 Type 2 diabetes mellitus without complications: Secondary | ICD-10-CM | POA: Diagnosis not present

## 2013-02-02 DIAGNOSIS — E782 Mixed hyperlipidemia: Secondary | ICD-10-CM | POA: Diagnosis not present

## 2013-02-02 DIAGNOSIS — Z951 Presence of aortocoronary bypass graft: Secondary | ICD-10-CM | POA: Diagnosis not present

## 2013-02-02 DIAGNOSIS — I739 Peripheral vascular disease, unspecified: Secondary | ICD-10-CM | POA: Diagnosis not present

## 2013-02-02 DIAGNOSIS — I2510 Atherosclerotic heart disease of native coronary artery without angina pectoris: Secondary | ICD-10-CM | POA: Diagnosis not present

## 2013-02-12 ENCOUNTER — Encounter (HOSPITAL_COMMUNITY): Payer: Self-pay | Admitting: Pharmacy Technician

## 2013-02-19 DIAGNOSIS — R5383 Other fatigue: Secondary | ICD-10-CM | POA: Diagnosis not present

## 2013-02-19 DIAGNOSIS — R5381 Other malaise: Secondary | ICD-10-CM | POA: Diagnosis not present

## 2013-02-19 DIAGNOSIS — D689 Coagulation defect, unspecified: Secondary | ICD-10-CM | POA: Diagnosis not present

## 2013-02-19 DIAGNOSIS — Z01818 Encounter for other preprocedural examination: Secondary | ICD-10-CM | POA: Diagnosis not present

## 2013-02-21 ENCOUNTER — Other Ambulatory Visit: Payer: Self-pay | Admitting: Cardiology

## 2013-02-22 ENCOUNTER — Encounter (HOSPITAL_COMMUNITY)
Admission: RE | Disposition: A | Discharge status: Home / Self Care | Payer: Self-pay | Source: Ambulatory Visit | Attending: Cardiovascular Disease

## 2013-02-22 ENCOUNTER — Ambulatory Visit (HOSPITAL_COMMUNITY)
Admission: RE | Admit: 2013-02-22 | Discharge: 2013-02-22 | Disposition: A | Discharge status: Home / Self Care | Payer: Medicare Other | Source: Ambulatory Visit | Attending: Cardiovascular Disease | Admitting: Cardiovascular Disease

## 2013-02-22 DIAGNOSIS — I10 Essential (primary) hypertension: Secondary | ICD-10-CM | POA: Insufficient documentation

## 2013-02-22 DIAGNOSIS — Z951 Presence of aortocoronary bypass graft: Secondary | ICD-10-CM | POA: Insufficient documentation

## 2013-02-22 DIAGNOSIS — I2510 Atherosclerotic heart disease of native coronary artery without angina pectoris: Secondary | ICD-10-CM | POA: Diagnosis not present

## 2013-02-22 DIAGNOSIS — I70219 Atherosclerosis of native arteries of extremities with intermittent claudication, unspecified extremity: Secondary | ICD-10-CM | POA: Diagnosis not present

## 2013-02-22 DIAGNOSIS — E119 Type 2 diabetes mellitus without complications: Secondary | ICD-10-CM | POA: Insufficient documentation

## 2013-02-22 HISTORY — PX: LOWER EXTREMITY ANGIOGRAM: SHX5508

## 2013-02-22 SURGERY — ANGIOGRAM, LOWER EXTREMITY

## 2013-02-22 MED ORDER — PREDNISONE 50 MG PO TABS
60.0000 mg | ORAL_TABLET | ORAL | Status: DC
  Filled 2013-02-22: qty 1

## 2013-02-22 MED ORDER — LIDOCAINE HCL (PF) 1 % IJ SOLN
INTRAMUSCULAR | Status: AC
  Filled 2013-02-22: qty 30

## 2013-02-22 MED ORDER — PREDNISONE 20 MG PO TABS
20.0000 mg | ORAL_TABLET | ORAL | Status: DC
  Filled 2013-02-22: qty 1

## 2013-02-22 MED ORDER — FENTANYL CITRATE 0.05 MG/ML IJ SOLN
INTRAMUSCULAR | Status: AC
  Filled 2013-02-22: qty 2

## 2013-02-22 MED ORDER — PREDNISONE 20 MG PO TABS: 60.0000 mg | ORAL_TABLET | ORAL | Status: DC

## 2013-02-22 MED ORDER — SODIUM CHLORIDE 0.9 % IV SOLN: 1.0000 mL/kg/h | INTRAVENOUS | Status: DC

## 2013-02-22 MED ORDER — MIDAZOLAM HCL 2 MG/2ML IJ SOLN
INTRAMUSCULAR | Status: AC
  Filled 2013-02-22: qty 2

## 2013-02-22 MED ORDER — ONDANSETRON HCL 4 MG/2ML IJ SOLN: 4.0000 mg | Freq: Four times a day (QID) | INTRAMUSCULAR | Status: DC | PRN

## 2013-02-22 MED ORDER — SODIUM CHLORIDE 0.9 % IV SOLN
INTRAVENOUS | Status: DC
  Administered 2013-02-22: 50 mL/h via INTRAVENOUS

## 2013-02-22 MED ORDER — DIPHENHYDRAMINE HCL 50 MG/ML IJ SOLN: 25.0000 mg | INTRAMUSCULAR | Status: DC

## 2013-02-22 MED ORDER — DIPHENHYDRAMINE HCL 50 MG/ML IJ SOLN
INTRAMUSCULAR | Status: AC
  Filled 2013-02-22: qty 1

## 2013-02-22 MED ORDER — HEPARIN (PORCINE) IN NACL 2-0.9 UNIT/ML-% IJ SOLN
INTRAMUSCULAR | Status: AC
  Filled 2013-02-22: qty 1000

## 2013-02-22 MED ORDER — ACETAMINOPHEN 325 MG PO TABS: 650.0000 mg | ORAL_TABLET | ORAL | Status: DC | PRN

## 2013-02-22 MED ORDER — SODIUM CHLORIDE 0.9 % IJ SOLN: 3.0000 mL | Freq: Two times a day (BID) | INTRAMUSCULAR | Status: DC

## 2013-02-22 MED ORDER — FAMOTIDINE IN NACL 20-0.9 MG/50ML-% IV SOLN: 20.0000 mg | INTRAVENOUS | Status: DC

## 2013-02-22 NOTE — Progress Notes (Signed)
PER JENNIFER,RN NO FURTHER PREMEDS NEEDED AND NO CXR NEEDED

## 2013-02-22 NOTE — H&P (Signed)
History and Physical Interval Note:  NAME:  [name]   MRN: [id number] DOB:  05/01/1933   ADMIT DATE: (Not on file)   02/22/2013 1:11 PM  [name] is a 79 y.o. female with Doppler evidence of severe bilateral SFA stenosis who now has lifestyle limiting claudication with Left > Right symptoms.   Her PMH includes PTCA of LAD [id number] --> CABG: -- LIMA-LAD, SVG-OM patent & SVG-D1 with moderate disease. See my dictated clinic note for details.  Past Medical History  Diagnosis Date  . CAD (coronary artery disease)   . Hypertension   . Diabetes mellitus   . Hx of CABG    No past surgical history on file.  FAMHx: No family history on file.  SOCHx:  has no tobacco, alcohol, and drug history on file.  ALLERGIES: Allergies  Allergen Reactions  . Shellfish Allergy Shortness Of Breath    All seafood  . Glucophage (Metformin Hcl)   . Iohexol   . Ivp Dye (Iodinated Diagnostic Agents)   . Latex     REACTION: wheezing    HOME MEDICATIONS: No prescriptions prior to admission    PHYSICAL EXAM:Blood pressure 148/83, pulse 69, temperature 98 F (36.7 C), temperature source Oral, resp. rate 18, height 5\' 2"  (1.575 m), weight 78.019 kg (172 lb), SpO2 100.00%. No change to exam from clinic note  IMPRESSION & PLAN The patients' history has been reviewed, patient examined, no change in status from most recent note, stable for surgery. I have reviewed the patients' chart and labs. Questions were answered to the patient's satisfaction.    Harrell Gave has presented today for surgery, with the diagnosis of chest pain The various methods of treatment have been discussed with the patient and family.   Risks / Complications include, but not limited to: Death, MI, CVA/TIA, VF/VT (with defibrillation), Bradycardia (need for temporary pacer placement), contrast induced nephropathy, bleeding / bruising / hematoma / pseudoaneurysm, vascular or coronary injury (with possible emergent CT or  Vascular Surgery), adverse medication reactions, infection.     After consideration of risks, benefits and other options for treatment, the patient has consented to Procedure(s):   PERIPHERAL VASCULAR ANGIOGRAPHY +/- PERIPHERAL VASCULAR INTERVENTION (ATHERECTOMY, PTA, STENT)  as a surgical intervention.   We will proceed with the planned procedure.   HARDING,DAVID W [facility] [address] [place], [place]  [address]  [phone]  02/22/2013 1:11 PM

## 2013-02-22 NOTE — CV Procedure (Signed)
[facility]  PERIPHERAL VASCULAR PROCEDURE  NAME:  [name]   MRN: [id number] DOB:  12/16/1932   ADMIT DATE: 02/22/2013  Performing Cardiologist: [name], M.D., MS Primary Physician: Michiel Sites, MD Primary Cardiologist:  [name], M.D., MS  Procedures Performed:  Abdominal Aortic Angiogram with Bi-Iliofemoral Runoff  Bilateral Lower Extremity Angiography   Indication(s): Life-limiting claudicatoin L > R  Claudication  Critical Limb Ischemia  History: 79 y.o. female with a history of CAD (CABG) and doppler evidence of significant bilateral SFA stenosis.  She has had worsening claudication symptoms, worse in the Left leg.  She is referred for Peripheral Angiography with possible PTA-Stenting.  Due to contrast allergy, she was premedicated overnight with steroids as well as pre-procedurally.  Consent: The procedure with Risks/Benefits/Alternatives and Indications was reviewed with the patient .  All questions were answered.  Risks / Complications include, but not limited to: Death, MI, CVA/TIA, VF/VT (with defibrillation), Bradycardia (need for temporary pacer placement), contrast induced nephropathy, bleeding / bruising / hematoma / pseudoaneurysm, vascular injury (with possible Vascular Surgery), adverse medication reactions, infection, compartment syndrome.    The patient voices understanding and agree to proceed.  Consent for signed by MD and patient with RN witness -- placed on chart.    Procedure: The patient was brought to the 2nd Floor [facility] in the fasting state and prepped and draped in the usual sterile fashion for (Right groin.    Sterile technique was used including antiseptics, cap, gloves, gown, hand hygiene, mask and sheet.  Skin prep: Chlorhexidine.  Time Out: Verified patient identification, verified procedure, site/side was marked, verified correct patient position, special  equipment/implants available, medications/allergies/relevent history reviewed, required imaging and test results available.  Performed  The right femoral head was identified using tactile and fluoroscopic technique.  The right groin was anesthetized with 1% subcutaneous Lidocaine.  The right Common Femoral Artery was accessed using the Modified Seldinger Technique with placement of a antimicrobial bonded/coated single lumen (5 Fr) sheath.  The sheath was aspirated and flushed.    A 5 Fr Short Pigtail Catheter was advanced of over a Versicore wire into the descending Aorta to a level just above the renal arteries. A power injection of 48ml/sec contrast over 1 sec was performed for Abdominal Aortic Angiography.  The catheter was then pulled back to a level just above the Aortic bifurcation, and a second power injection was performed to evaluate bi-ileiofemoral arteries with bilateral lower-extremity runnoff.  The Pigtail catheter was removed over a wire and a focused angiogram of the Right popliteal-TP trunk and lower runoff was performed.   Findings:  Abdominal aorta: Normal caliber, angiographically normal.  Left renal artery: Normal caliber, angiographically normal - bifurcates in the mid vessel. The inferior branch has a mild to moderate 20% stenoses before it enters the body of kidney.There is a small accessory branch off the aorta  Right renal artery: Normal caliber, angiographically normal.   Celiac artery: Normal caliber, angiographically normal.  Superior mesenteric artery: Normal caliber, angiographically normal.  Right common iliac artery: Normal caliber, angiographically normal.  Right internal iliac artery: Normal caliber, angiographically normal.  Right external iliac artery: Normal caliber, mild 10-20% luminal irregularities.  Right common femoral artery: Normal caliber, angiographically normal.  Right profunda femoral artery: Mild luminal irregularities in the ostial and  midportion  Right superficial femoral artery: The proximal vessel is relatively free of significant disease. There is diffuse calcified 30-60% lesions throughout the  mid vessel leading to the adductor canal.  Right popliteal artery: The distal SFA into this vessel there is diffuse calcified 50-70% stenoses. This continues into the tibioperoneal trunk.  Right tibial peroneal trunk: Diffuse 70-90% stenosis.  Right anterior tibial artery: Ostial 90% followed by subtotal occlusion was faint collateral flow reaching one third of the way down the calf.  Right dorsalis pedis artery: Fills via collateral from the peroneal artery distally  Right peroneal artery: Ostial stenosis at the from the distal TP trunk, however the remainder the vessel is relatively free of disease. It provides a significant collateral to the distal posterior tibial artery and a faint collateral to the dorsalis pedis  Right posterior tibial artery: Ostial 99% followed by proximal tapering a total occlusion with faint collaterals filling less than one half of the way down the cath. The vessel then back fills via collaterals from the peroneal artery.  Left common iliac artery:  Normal caliber, angiographically normal, with the exception of a focal calcified ~20% stenosis in the very proximal inferior wall.  Left internal iliac artery: Normal caliber, angiographically normal.  Left external iliac artery: Normal caliber, angiographically normal.  Left common femoral artery: Normal caliber, angiographically normal.  Left profunda femoral artery: Mild luminal irregularities.  Left superficial femoral artery:  The proximal and mid vessel minimal luminal irregularities area distally there is diffuse focal 30-70% lesions leading to the distal and of the popliteal vessel.  Left popliteal artery: At the transition between the SFA and popliteal there is a 70% stenosis followed by diffuse calcified stenosis behind the knee of roughly  50-70%.  Left tibial peroneal trunk: This vessel is occluded just after the takeoff of anterior tibial artery.  Left anterior tibial artery: Ostial 70% stenosis. The vessel continues to the distal leg but tapers to the occluded before reaching the foot. Is just faint distal filling.  Left dorsalis pedis artery: Fills faintly via collaterals from the distal anterior tibial  Left peroneal artery: Occluded from the PT trunk  Left posterior tibial artery: Ostial and proximal occlusion, reconstitutes via a 2 geniculate branches  The patient was transported to the PACU holding area in stable condition.   The patient  was stable before, during and following the procedure.   Patient did tolerate procedure well. There were not complications. EBL: < 5 mL  Medications: Subacute lidocaine 18 mL  Premedication: 5 mg Valium, 25 mg  Benadryl,  125 mg Solumedrol (preceded by 3 doses of every 6 hours 60 mg prednisone starting yesterday evening.)  Sedation:  2 mg IV Versed, 25 IV mcg Fentanyl  Contrast:  160 Omnipaque Hemodynamics:  Central Aortic Pressure: 150/76 mmHg  Impression:  Severe, calcified popliteal and tibial peroneal trunk disease bilaterally  Occluded left tibioperoneal trunk with 2 vessel runoff due to collateral filling of mid to distal posterior tibial   Occluded right anterior and posterior tibial arteries with patent peroneal artery  Plan:  Due to the extent of calcification in bilateral popliteal arteries and poor distal runoff, the decision was made to abort any interventional attempts today in order to allow the patient to the be referred to Dr. [name] for outpatient evaluation. Bracing the films and is considering rotational atherectomy of the left popliteal artery followed by the attempted tibioperoneal trunk atherectomy.  Standard post diagnostic catheterization care, with plan discharged later on today.  The patient we discharged later today to followup with Dr.  Allyson Sabal [facility]   The case and  results was discussed with the patient (and family).  The case and results was discussed with the patient's Cardiologist.  Time Spend Directly with Patient:   40  minutes  HARDING,DAVID W, M.D., M.S. [facility] [address] [place], [place]  [address]  [phone]  02/22/2013 2:19 PM

## 2013-03-21 ENCOUNTER — Other Ambulatory Visit (HOSPITAL_COMMUNITY): Payer: Self-pay | Admitting: Cardiovascular Disease

## 2013-03-21 DIAGNOSIS — I2510 Atherosclerotic heart disease of native coronary artery without angina pectoris: Secondary | ICD-10-CM | POA: Diagnosis not present

## 2013-03-21 DIAGNOSIS — I10 Essential (primary) hypertension: Secondary | ICD-10-CM | POA: Diagnosis not present

## 2013-03-21 DIAGNOSIS — I739 Peripheral vascular disease, unspecified: Secondary | ICD-10-CM | POA: Diagnosis not present

## 2013-03-21 DIAGNOSIS — I25810 Atherosclerosis of coronary artery bypass graft(s) without angina pectoris: Secondary | ICD-10-CM

## 2013-04-03 ENCOUNTER — Ambulatory Visit (HOSPITAL_COMMUNITY)
Admission: RE | Admit: 2013-04-03 | Discharge: 2013-04-03 | Disposition: A | Discharge status: Home / Self Care | Payer: Medicare Other | Source: Ambulatory Visit | Attending: Cardiovascular Disease | Admitting: Cardiovascular Disease

## 2013-04-03 DIAGNOSIS — I25810 Atherosclerosis of coronary artery bypass graft(s) without angina pectoris: Secondary | ICD-10-CM | POA: Insufficient documentation

## 2013-04-03 DIAGNOSIS — I2510 Atherosclerotic heart disease of native coronary artery without angina pectoris: Secondary | ICD-10-CM | POA: Diagnosis not present

## 2013-04-03 MED ORDER — TECHNETIUM TC 99M SESTAMIBI GENERIC - CARDIOLITE
29.8000 | Freq: Once | INTRAVENOUS | Status: AC | PRN
  Administered 2013-04-03: 29.8 via INTRAVENOUS

## 2013-04-03 MED ORDER — AMINOPHYLLINE 25 MG/ML IV SOLN
75.0000 mg | Freq: Once | INTRAVENOUS | Status: AC
  Administered 2013-04-03: 75 mg via INTRAVENOUS

## 2013-04-03 MED ORDER — TECHNETIUM TC 99M SESTAMIBI GENERIC - CARDIOLITE
10.5000 | Freq: Once | INTRAVENOUS | Status: AC | PRN
  Administered 2013-04-03: 11 via INTRAVENOUS

## 2013-04-03 MED ORDER — REGADENOSON 0.4 MG/5ML IV SOLN
0.4000 mg | Freq: Once | INTRAVENOUS | Status: AC
  Administered 2013-04-03: 0.4 mg via INTRAVENOUS

## 2013-04-03 NOTE — Procedures (Addendum)
[facility] [address] [place] [address] [phone]  Cardiology Nuclear Med Study  [name] is a 79 y.o. female     MRN : [id number]     DOB: 1933-10-04  Procedure Date: 04/03/2013  Nuclear Med Background Indication for Stress Test:  Surgical Clearance and Post Hospital History:  CAD;CABG X3--1998;PTCA--1992 Cardiac Risk Factors: Hypertension, IDDM Type 2, Lipids, Overweight, PVD and TIA  Symptoms:  Dizziness, Fatigue, Light-Headedness, SOB and LEG PAIN WITH AMBULATION   Nuclear Pre-Procedure Caffeine/Decaff Intake:  1:00am NPO After: 11 AM   IV Site: R Forearm  IV 0.9% NS with Angio Cath:  22g  Chest Size (in):  N/A IV Started by: [name], RN  Height: 5\' 3"  (1.6 m)  Cup Size: n/a  BMI:  Body mass index is 28 kg/(m^2). Weight:  158 lb (71.668 kg)   Tech Comments:  N/A    Nuclear Med Study 1 or 2 day study: 1 day  Stress Test Type:  Lexiscan  Order Authorizing Provider:  [name], MD   Resting Radionuclide: Technetium 40m Sestamibi  Resting Radionuclide Dose: 10.5 mCi   Stress Radionuclide:  Technetium 58m Sestamibi  Stress Radionuclide Dose: 29.8 mCi           Stress Protocol Rest HR:59 Stress HR: 75  Rest BP: 131/71 Stress BP:149/76  Exercise Time (min): n/a METS: n/a          Dose of Adenosine (mg):  n/a Dose of Lexiscan: 0.4 mg  Dose of Atropine (mg): n/a Dose of Dobutamine: n/a mcg/kg/min (at max HR)  Stress Test Technologist: [name], CCT Nuclear Technologist: [name], CNMT   Rest Procedure:  Myocardial perfusion imaging was performed at rest 45 minutes following the intravenous administration of Technetium 30m Sestamibi. Stress Procedure:  The patient received IV Lexiscan 0.4 mg over 15-seconds.  Technetium 31m Sestamibi injected at 30-seconds.  Due to patient's head pain and shortness of breath, she was given IV Aminophylline 75 mg. Symptoms were resolved during  recovery. There were no significant changes with Lexiscan.  Quantitative spect images were obtained after a 45 minute delay.  Transient Ischemic Dilatation (Normal <1.22):  1.20 Lung/Heart Ratio (Normal <0.45):  0.38 QGS EDV:  53 ml QGS ESV:  15 ml LV Ejection Fraction: 72%  Signed by [name], CNMT  PHYSICIAN INTERPRETATION:  Rest ECG: NSR with non-specific ST-T wave changes  Stress ECG: No significant ST segment change suggestive of ischemia.  QPS Raw Data Images:  There is interference from nuclear activity from structures below the diaphragm. This does not affect the ability to read the study.  Mild breast attenuation.  Normal left ventricular size. Stress Images:  There is decreased uptake in the apex.  There is a small sized, mild intensity, mostly fixed perfusion defect in the apical anterolateral wall with minimal reversibility. Rest Images:  There is decreased uptake in the apex. Subtraction (SDS):  There is a fixed defect that is either consistent with a previous infarction or breast attenuation with shifting breast shadow. No significant reversibility is appreciated, suggesting perhaps mild peri-infarct ischemia.  Impression Exercise Capacity:  Lexiscan with no exercise. BP Response:  Normal blood pressure response. Clinical Symptoms:  There is dyspnea. Headache ECG Impression:  No significant ECG changes with Lexiscan. LV Wall Motion:  NL LV Function; NL Wall Motion.  Lack of notable wall motion abnormality would argue for breast attenuation over prior infarction.  Comparison with Prior Nuclear  Study: No significant change from previous study  Overall Impression:  Low risk stress nuclear study.  Abnormal Myocardial Perfusion Scan with an apical perfusion defect, but no significant ischemia.   [name], MD  04/03/2013 5:34 PM

## 2013-04-23 DIAGNOSIS — E119 Type 2 diabetes mellitus without complications: Secondary | ICD-10-CM | POA: Diagnosis not present

## 2013-04-23 DIAGNOSIS — E782 Mixed hyperlipidemia: Secondary | ICD-10-CM | POA: Diagnosis not present

## 2013-04-23 DIAGNOSIS — I70219 Atherosclerosis of native arteries of extremities with intermittent claudication, unspecified extremity: Secondary | ICD-10-CM | POA: Diagnosis not present

## 2013-04-23 DIAGNOSIS — I739 Peripheral vascular disease, unspecified: Secondary | ICD-10-CM | POA: Diagnosis not present

## 2013-04-24 ENCOUNTER — Telehealth: Payer: Self-pay | Admitting: Cardiovascular Disease

## 2013-04-24 NOTE — Telephone Encounter (Signed)
Patient called to inform us that the percocet rx that was written by laura Ingold-NP yesterday did not have a strength written on it. Advised patient to bring the RX back tomorrow and I will have Vernona Rieger to complete the RX.

## 2013-04-24 NOTE — Telephone Encounter (Signed)
Please call-concerning prescription [name] wrote yesterday!

## 2013-04-24 NOTE — Telephone Encounter (Signed)
Left message for patient to return call.

## 2013-04-25 DIAGNOSIS — E1149 Type 2 diabetes mellitus with other diabetic neurological complication: Secondary | ICD-10-CM | POA: Diagnosis not present

## 2013-04-25 DIAGNOSIS — G589 Mononeuropathy, unspecified: Secondary | ICD-10-CM | POA: Diagnosis not present

## 2013-04-25 DIAGNOSIS — E1159 Type 2 diabetes mellitus with other circulatory complications: Secondary | ICD-10-CM | POA: Diagnosis not present

## 2013-04-25 DIAGNOSIS — E119 Type 2 diabetes mellitus without complications: Secondary | ICD-10-CM | POA: Diagnosis not present

## 2013-04-26 ENCOUNTER — Other Ambulatory Visit: Payer: Self-pay | Admitting: Cardiovascular Disease

## 2013-04-26 DIAGNOSIS — D689 Coagulation defect, unspecified: Secondary | ICD-10-CM | POA: Diagnosis not present

## 2013-04-26 DIAGNOSIS — Z79899 Other long term (current) drug therapy: Secondary | ICD-10-CM | POA: Diagnosis not present

## 2013-04-26 LAB — CBC
Hemoglobin: 11.6 g/dL — ABNORMAL LOW (ref 12.0–15.0)
MCH: 26.5 pg (ref 26.0–34.0)
MCV: 84.5 fL (ref 78.0–100.0)
RBC: 4.38 MIL/uL (ref 3.87–5.11)

## 2013-04-26 LAB — TSH: TSH: 1.504 u[IU]/mL (ref 0.350–4.500)

## 2013-04-26 LAB — BASIC METABOLIC PANEL WITH GFR
CO2: 26 mEq/L (ref 19–32)
Calcium: 8.7 mg/dL (ref 8.4–10.5)
Chloride: 103 mEq/L (ref 96–112)
Sodium: 136 mEq/L (ref 135–145)

## 2013-04-27 LAB — PROTIME-INR: Prothrombin Time: 12.9 seconds (ref 11.6–15.2)

## 2013-05-01 ENCOUNTER — Encounter: Payer: Self-pay | Admitting: *Deleted

## 2013-05-01 ENCOUNTER — Other Ambulatory Visit: Payer: Self-pay | Admitting: *Deleted

## 2013-05-01 ENCOUNTER — Encounter: Payer: Self-pay | Admitting: Cardiology

## 2013-05-01 DIAGNOSIS — Z91041 Radiographic dye allergy status: Secondary | ICD-10-CM

## 2013-05-01 DIAGNOSIS — Z01810 Encounter for preprocedural cardiovascular examination: Secondary | ICD-10-CM

## 2013-05-02 ENCOUNTER — Telehealth: Payer: Self-pay | Admitting: Cardiovascular Disease

## 2013-05-02 DIAGNOSIS — M79609 Pain in unspecified limb: Secondary | ICD-10-CM | POA: Diagnosis not present

## 2013-05-02 DIAGNOSIS — I10 Essential (primary) hypertension: Secondary | ICD-10-CM | POA: Diagnosis not present

## 2013-05-02 DIAGNOSIS — E119 Type 2 diabetes mellitus without complications: Secondary | ICD-10-CM | POA: Diagnosis not present

## 2013-05-02 NOTE — Telephone Encounter (Signed)
Returning your call concerning her lab results!

## 2013-05-02 NOTE — Telephone Encounter (Signed)
Lm with lab results

## 2013-05-11 ENCOUNTER — Encounter (HOSPITAL_COMMUNITY): Payer: Self-pay | Admitting: Pharmacy Technician

## 2013-05-13 DIAGNOSIS — I739 Peripheral vascular disease, unspecified: Secondary | ICD-10-CM

## 2013-05-13 HISTORY — DX: Peripheral vascular disease, unspecified: I73.9

## 2013-05-15 ENCOUNTER — Other Ambulatory Visit: Payer: Self-pay | Admitting: Cardiology

## 2013-05-15 ENCOUNTER — Encounter (HOSPITAL_COMMUNITY): Payer: Self-pay | Admitting: General Practice

## 2013-05-15 ENCOUNTER — Encounter (HOSPITAL_COMMUNITY)
Admission: RE | Disposition: A | Discharge status: Home / Self Care | Payer: Self-pay | Source: Ambulatory Visit | Attending: Cardiovascular Disease

## 2013-05-15 ENCOUNTER — Ambulatory Visit (HOSPITAL_COMMUNITY)
Admission: RE | Admit: 2013-05-15 | Discharge: 2013-05-17 | Disposition: A | Discharge status: Home / Self Care | Payer: Medicare Other | Source: Ambulatory Visit | Attending: Cardiovascular Disease | Admitting: Cardiovascular Disease

## 2013-05-15 DIAGNOSIS — E785 Hyperlipidemia, unspecified: Secondary | ICD-10-CM

## 2013-05-15 DIAGNOSIS — Z91041 Radiographic dye allergy status: Secondary | ICD-10-CM

## 2013-05-15 DIAGNOSIS — I498 Other specified cardiac arrhythmias: Secondary | ICD-10-CM | POA: Insufficient documentation

## 2013-05-15 DIAGNOSIS — Z79899 Other long term (current) drug therapy: Secondary | ICD-10-CM | POA: Diagnosis not present

## 2013-05-15 DIAGNOSIS — Z01810 Encounter for preprocedural cardiovascular examination: Secondary | ICD-10-CM

## 2013-05-15 DIAGNOSIS — I2510 Atherosclerotic heart disease of native coronary artery without angina pectoris: Secondary | ICD-10-CM

## 2013-05-15 DIAGNOSIS — I739 Peripheral vascular disease, unspecified: Secondary | ICD-10-CM

## 2013-05-15 DIAGNOSIS — E119 Type 2 diabetes mellitus without complications: Secondary | ICD-10-CM

## 2013-05-15 DIAGNOSIS — I6350 Cerebral infarction due to unspecified occlusion or stenosis of unspecified cerebral artery: Secondary | ICD-10-CM

## 2013-05-15 DIAGNOSIS — I70219 Atherosclerosis of native arteries of extremities with intermittent claudication, unspecified extremity: Secondary | ICD-10-CM

## 2013-05-15 DIAGNOSIS — Z951 Presence of aortocoronary bypass graft: Secondary | ICD-10-CM | POA: Diagnosis not present

## 2013-05-15 DIAGNOSIS — I25119 Atherosclerotic heart disease of native coronary artery with unspecified angina pectoris: Secondary | ICD-10-CM | POA: Diagnosis present

## 2013-05-15 DIAGNOSIS — I10 Essential (primary) hypertension: Secondary | ICD-10-CM | POA: Diagnosis not present

## 2013-05-15 DIAGNOSIS — E1169 Type 2 diabetes mellitus with other specified complication: Secondary | ICD-10-CM | POA: Diagnosis present

## 2013-05-15 DIAGNOSIS — I110 Hypertensive heart disease with heart failure: Secondary | ICD-10-CM | POA: Diagnosis present

## 2013-05-15 DIAGNOSIS — Z95820 Peripheral vascular angioplasty status with implants and grafts: Secondary | ICD-10-CM

## 2013-05-15 DIAGNOSIS — R001 Bradycardia, unspecified: Secondary | ICD-10-CM | POA: Diagnosis not present

## 2013-05-15 HISTORY — PX: ATHERECTOMY: SHX5502

## 2013-05-15 HISTORY — PX: OTHER SURGICAL HISTORY: SHX169

## 2013-05-15 HISTORY — PX: PERCUTANEOUS STENT INTERVENTION: SHX5500

## 2013-05-15 HISTORY — DX: Bradycardia, unspecified: R00.1

## 2013-05-15 HISTORY — DX: Gastro-esophageal reflux disease without esophagitis: K21.9

## 2013-05-15 LAB — CBC
MCH: 26.9 pg (ref 26.0–34.0)
Platelets: 278 10*3/uL (ref 150–400)
RBC: 4.32 MIL/uL (ref 3.87–5.11)
RDW: 13.6 % (ref 11.5–15.5)
WBC: 6 10*3/uL (ref 4.0–10.5)

## 2013-05-15 LAB — GLUCOSE, CAPILLARY
Glucose-Capillary: 155 mg/dL — ABNORMAL HIGH (ref 70–99)
Glucose-Capillary: 260 mg/dL — ABNORMAL HIGH (ref 70–99)
Glucose-Capillary: 250 mg/dL — ABNORMAL HIGH (ref 70–99)

## 2013-05-15 LAB — PROTIME-INR
INR: 1.03 (ref 0.00–1.49)
Prothrombin Time: 13.4 seconds (ref 11.6–15.2)

## 2013-05-15 LAB — POCT ACTIVATED CLOTTING TIME
Activated Clotting Time: 236 seconds
Activated Clotting Time: 230 seconds
Activated Clotting Time: 170 seconds

## 2013-05-15 LAB — BASIC METABOLIC PANEL
CO2: 25 mEq/L (ref 19–32)
Calcium: 9.5 mg/dL (ref 8.4–10.5)
Creatinine, Ser: 1.22 mg/dL — ABNORMAL HIGH (ref 0.50–1.10)
GFR calc Af Amer: 48 mL/min — ABNORMAL LOW (ref >90)
Sodium: 139 mEq/L (ref 135–145)

## 2013-05-15 SURGERY — ATHERECTOMY
Anesthesia: LOCAL | Laterality: Right

## 2013-05-15 MED ORDER — HEPARIN (PORCINE) IN NACL 2-0.9 UNIT/ML-% IJ SOLN
INTRAMUSCULAR | Status: AC
  Filled 2013-05-15: qty 1000

## 2013-05-15 MED ORDER — FAMOTIDINE IN NACL 20-0.9 MG/50ML-% IV SOLN
20.0000 mg | INTRAVENOUS | Status: AC
  Administered 2013-05-15: 20 mg via INTRAVENOUS

## 2013-05-15 MED ORDER — HEPARIN SODIUM (PORCINE) 1000 UNIT/ML IJ SOLN
INTRAMUSCULAR | Status: AC
  Filled 2013-05-15: qty 1

## 2013-05-15 MED ORDER — SODIUM CHLORIDE 0.9 % IV SOLN
INTRAVENOUS | Status: DC
  Administered 2013-05-15: 08:00:00 via INTRAVENOUS

## 2013-05-15 MED ORDER — GLIMEPIRIDE 4 MG PO TABS
4.0000 mg | ORAL_TABLET | Freq: Every day | ORAL | Status: DC
  Administered 2013-05-16 – 2013-05-17 (×2): 4 mg via ORAL
  Filled 2013-05-15 (×4): qty 1

## 2013-05-15 MED ORDER — EZETIMIBE 10 MG PO TABS
10.0000 mg | ORAL_TABLET | Freq: Every day | ORAL | Status: DC
  Administered 2013-05-15 – 2013-05-16 (×2): 10 mg via ORAL
  Filled 2013-05-15 (×3): qty 1

## 2013-05-15 MED ORDER — INSULIN ASPART 100 UNIT/ML ~~LOC~~ SOLN
0.0000 [IU] | Freq: Three times a day (TID) | SUBCUTANEOUS | Status: DC
  Administered 2013-05-16: 09:00:00 2 [IU] via SUBCUTANEOUS
  Administered 2013-05-16: 13:00:00 1 [IU] via SUBCUTANEOUS

## 2013-05-15 MED ORDER — LIDOCAINE HCL (PF) 1 % IJ SOLN
INTRAMUSCULAR | Status: AC
  Filled 2013-05-15: qty 30

## 2013-05-15 MED ORDER — MORPHINE SULFATE 2 MG/ML IJ SOLN
2.0000 mg | INTRAMUSCULAR | Status: DC | PRN
  Administered 2013-05-15: 2 mg via INTRAVENOUS
  Filled 2013-05-15: qty 1

## 2013-05-15 MED ORDER — DIAZEPAM 5 MG PO TABS
ORAL_TABLET | ORAL | Status: AC
  Filled 2013-05-15: qty 1

## 2013-05-15 MED ORDER — METHYLPREDNISOLONE SODIUM SUCC 125 MG IJ SOLR
60.0000 mg | INTRAMUSCULAR | Status: AC
Start: 2013-05-15 — End: 2013-05-15
  Administered 2013-05-15: 60 mg via INTRAVENOUS

## 2013-05-15 MED ORDER — ONDANSETRON HCL 4 MG/2ML IJ SOLN
4.0000 mg | Freq: Four times a day (QID) | INTRAMUSCULAR | Status: DC | PRN
  Filled 2013-05-15: qty 2

## 2013-05-15 MED ORDER — MIDAZOLAM HCL 2 MG/2ML IJ SOLN
INTRAMUSCULAR | Status: AC
  Filled 2013-05-15: qty 2

## 2013-05-15 MED ORDER — SULFANILAMIDE 15 % VA CREA: 1.0000 | TOPICAL_CREAM | Freq: Every day | VAGINAL | Status: DC

## 2013-05-15 MED ORDER — DIAZEPAM 5 MG PO TABS
5.0000 mg | ORAL_TABLET | ORAL | Status: AC
  Administered 2013-05-15: 5 mg via ORAL

## 2013-05-15 MED ORDER — CLOPIDOGREL BISULFATE 75 MG PO TABS
75.0000 mg | ORAL_TABLET | Freq: Every day | ORAL | Status: DC
  Administered 2013-05-16 – 2013-05-17 (×2): 75 mg via ORAL
  Filled 2013-05-15 (×2): qty 1

## 2013-05-15 MED ORDER — NITROGLYCERIN 0.4 MG SL SUBL: 0.4000 mg | SUBLINGUAL_TABLET | SUBLINGUAL | Status: DC | PRN

## 2013-05-15 MED ORDER — AMLODIPINE BESYLATE 10 MG PO TABS
10.0000 mg | ORAL_TABLET | Freq: Every day | ORAL | Status: DC
  Administered 2013-05-16 – 2013-05-17 (×2): 10 mg via ORAL
  Filled 2013-05-15 (×2): qty 1

## 2013-05-15 MED ORDER — AMLODIPINE BESYLATE-VALSARTAN 10-320 MG PO TABS: 1.0000 | ORAL_TABLET | Freq: Every day | ORAL | Status: DC

## 2013-05-15 MED ORDER — NITROGLYCERIN IN D5W 200-5 MCG/ML-% IV SOLN
INTRAVENOUS | Status: AC
  Filled 2013-05-15: qty 250

## 2013-05-15 MED ORDER — ASPIRIN 81 MG PO CHEW
324.0000 mg | CHEWABLE_TABLET | ORAL | Status: DC
  Filled 2013-05-15: qty 4

## 2013-05-15 MED ORDER — FENTANYL CITRATE 0.05 MG/ML IJ SOLN
INTRAMUSCULAR | Status: AC
  Filled 2013-05-15: qty 2

## 2013-05-15 MED ORDER — CLOPIDOGREL BISULFATE 300 MG PO TABS
ORAL_TABLET | ORAL | Status: AC
  Filled 2013-05-15: qty 1

## 2013-05-15 MED ORDER — IRBESARTAN 300 MG PO TABS
300.0000 mg | ORAL_TABLET | Freq: Every day | ORAL | Status: DC
  Administered 2013-05-16 – 2013-05-17 (×2): 300 mg via ORAL
  Filled 2013-05-15 (×2): qty 1

## 2013-05-15 MED ORDER — METHYLPREDNISOLONE SODIUM SUCC 125 MG IJ SOLR
INTRAMUSCULAR | Status: AC
  Filled 2013-05-15: qty 2

## 2013-05-15 MED ORDER — VERAPAMIL HCL 2.5 MG/ML IV SOLN
INTRAVENOUS | Status: AC
  Filled 2013-05-15: qty 2

## 2013-05-15 MED ORDER — LINAGLIPTIN 5 MG PO TABS
5.0000 mg | ORAL_TABLET | Freq: Every day | ORAL | Status: DC
  Administered 2013-05-15 – 2013-05-17 (×3): 5 mg via ORAL
  Filled 2013-05-15 (×3): qty 1

## 2013-05-15 MED ORDER — SODIUM CHLORIDE 0.9 % IV SOLN
INTRAVENOUS | Status: AC
  Administered 2013-05-15: 12:00:00 via INTRAVENOUS

## 2013-05-15 MED ORDER — ACETAMINOPHEN 325 MG PO TABS
650.0000 mg | ORAL_TABLET | ORAL | Status: DC | PRN
  Administered 2013-05-16 – 2013-05-17 (×3): 650 mg via ORAL
  Filled 2013-05-15 (×4): qty 2

## 2013-05-15 MED ORDER — METOPROLOL SUCCINATE ER 100 MG PO TB24
100.0000 mg | ORAL_TABLET | Freq: Every day | ORAL | Status: DC
  Administered 2013-05-16 – 2013-05-17 (×2): 100 mg via ORAL
  Filled 2013-05-15 (×2): qty 1

## 2013-05-15 MED ORDER — ATORVASTATIN CALCIUM 80 MG PO TABS
80.0000 mg | ORAL_TABLET | Freq: Every day | ORAL | Status: DC
  Administered 2013-05-15 – 2013-05-16 (×2): 80 mg via ORAL
  Filled 2013-05-15 (×3): qty 1

## 2013-05-15 MED ORDER — DIPHENHYDRAMINE HCL 50 MG/ML IJ SOLN
25.0000 mg | INTRAMUSCULAR | Status: AC
  Administered 2013-05-15: 25 mg via INTRAVENOUS

## 2013-05-15 MED ORDER — LORAZEPAM 0.5 MG PO TABS
0.5000 mg | ORAL_TABLET | Freq: Three times a day (TID) | ORAL | Status: DC | PRN
  Administered 2013-05-16: 0.5 mg via ORAL
  Filled 2013-05-15: qty 1

## 2013-05-15 MED ORDER — ASPIRIN EC 325 MG PO TBEC
325.0000 mg | DELAYED_RELEASE_TABLET | Freq: Every day | ORAL | Status: DC
  Administered 2013-05-16 – 2013-05-17 (×2): 325 mg via ORAL
  Filled 2013-05-15 (×2): qty 1

## 2013-05-15 MED ORDER — SODIUM CHLORIDE 0.9 % IJ SOLN: 3.0000 mL | INTRAMUSCULAR | Status: DC | PRN

## 2013-05-15 MED ORDER — DIPHENHYDRAMINE HCL 50 MG/ML IJ SOLN
INTRAMUSCULAR | Status: AC
  Filled 2013-05-15: qty 1

## 2013-05-15 MED ORDER — CLOPIDOGREL BISULFATE 75 MG PO TABS: 75.0000 mg | ORAL_TABLET | Freq: Every day | ORAL | Status: DC

## 2013-05-15 MED ORDER — ASPIRIN 81 MG PO CHEW
324.0000 mg | CHEWABLE_TABLET | ORAL | Status: AC
  Administered 2013-05-15: 324 mg via ORAL

## 2013-05-15 NOTE — Progress Notes (Signed)
Site area: left groin  Site Prior to Removal:  Level 0  Pressure Applied For 20 MINUTES    Minutes Beginning at 1500   Manual:   yes  Patient Status During Pull:  stable  Post Pull Groin Site:  Level 0  Post Pull Instructions Given:  yes  Post Pull Pulses Present:  yes  Dressing Applied:  yes  Comments:

## 2013-05-15 NOTE — H&P (Signed)
  H & P will be scanned in.  Pt was reexamined and existing H & P reviewed. No changes found.  Runell Gess, MD [facility] 05/15/2013 9:35 AM

## 2013-05-15 NOTE — CV Procedure (Addendum)
[name] is a 79 y.o. female    [id number] LOCATION:  FACILITY: [facility]  PHYSICIAN: [name], M.D. Sep 13, 1933   DATE OF PROCEDURE:  05/15/2013  DATE OF DISCHARGE:  [facility]  PV Intervention    History obtained from chart review. [name] is a 79 year old married African American female patient of Dr. Onalee Hua Hardings. She has known CAD status post coronary bypass grafting in the past (1998). She has preserved LV function and nonischemic Myoview. Because of claudication she underwent peripheral vascular angiography and other Bryan Lemma 02/22/13 revealing significant popliteal and infrapopliteal disease. She has lifestyle limiting claudication. She presents today for [employer] orbital rotational atherectomy +/- PTA +/- stenting of her popliteal _/-  tibioperoneal trunk.   PROCEDURE DESCRIPTION:    The patient was brought to the second floor [name]iac cath lab in the postabsorptive state. She  was premedicated with Valium 5 mg by mouth, IV Versed and fentanyl. Her left groinwas prepped and shaved in usual sterile fashion. Xylocaine 1% was used for local anesthesia. A 7 French/55 cm Ansel sheath French sheath was inserted into the left common femoral artery using standard Seldinger technique. The patient received  7 thousand units  of heparin  intravenously.  A total of 250 cc of contrast was administered to the patient    HEMODYNAMICS:    AO SYSTOLIC/AO DIASTOLIC: 139/59    ANGIOGRAPHIC RESULTS:   1: Was a long 70% calcified lesion in the popliteal beginning above the knee and extending below the knee. There is one-vessel runoff via the peroneal artery. There was a 95-90% calcified lesion at the origin the peroneal artery as well as a 90% stenosis in the midportion. The peroneal artery artery collateralizes the posterior tibial at the level of the ankle.  IMPRESSION:high-grade calcified popliteal artery in sequence with a high-grade  proximal peroneal arteries one-vessel runoff. We'll proceed with Angiosculpt PTA of the peroneal artery +/- stenting followed by diamondback orbital rotational atherectomy of the popliteal artery followed by balloon adiposity with the "chocolate balloon" plus or minus stenting.  Procedure description: contralateral access was obtained with a 5 [place] crossover catheter, a 0.35 VersiCore and a 7 French/55 mm long Ansel sheath. The peroneal artery was crossed with a 014/300 cm length Rigali a wire through a 14 long quick cross end hold catheter. The guidewire was then exchanged for the viper wire .PTCA was then performed with a 2.5 x 15 mm long coronary Angiosculpt balloon at 16 atmospheres for 3 minutes resulting in reduction of 95% stenosis to less than 30% without dissection. Angiography performed several minutes later revealed recoil and thus a 2 5 x 18 Xpedition drug-eluting stent was then deployed at the ostium of the peroneal artery. Following this diamondback orbital rotational atherectomy was performed with a 2.0 classic for up to 120,000 RPMs for several runs. Following this PTA was performed with a 5 mm x 120 mm chocolate balloon worse 6 atmospheres at 3 minutes resulting reduction a long 70% calcified popliteal stenosis to less than 20% residual without dissection. Completion angiography revealed short segment occlusion of the distal peroneal the site of prior high-grade lesion and because of this lesion was rewired with regard wire and angioplastied with a 2 5 x 15 Angiosculpt balloon with an excellent angiographic result.  Final impression: Diamondback orbital rotational atherectomy of calcified high-grade segmental popliteal stenosis followed by Chocolate balloon PTA with an excellent angiographic result. Angioscultp PTA of the proximal peroneal followed by stenting using  a Xpedition drug-eluting stent and then Angiosculpt of distal peroneal with good runoff collateralizing the posterior tibial  artery. The sheath was then withdrawn across bifurcation and exchanged for a short 7 [place] sheath. The patient received 300 mg of Plavix orally. She'll be hydrated overnight, discharged on the morning of the followup Dopplers. I will see her back in the office which I will discuss staged left popliteal and tibial intervention.  Runell Gess MD, [name] 05/15/2013 11:45 AM

## 2013-05-16 ENCOUNTER — Encounter (HOSPITAL_COMMUNITY): Payer: Self-pay | Admitting: Cardiology

## 2013-05-16 DIAGNOSIS — I70219 Atherosclerosis of native arteries of extremities with intermittent claudication, unspecified extremity: Secondary | ICD-10-CM

## 2013-05-16 DIAGNOSIS — I2510 Atherosclerotic heart disease of native coronary artery without angina pectoris: Secondary | ICD-10-CM | POA: Diagnosis not present

## 2013-05-16 DIAGNOSIS — Z95820 Peripheral vascular angioplasty status with implants and grafts: Secondary | ICD-10-CM

## 2013-05-16 DIAGNOSIS — I6350 Cerebral infarction due to unspecified occlusion or stenosis of unspecified cerebral artery: Secondary | ICD-10-CM

## 2013-05-16 DIAGNOSIS — Z01810 Encounter for preprocedural cardiovascular examination: Secondary | ICD-10-CM | POA: Diagnosis not present

## 2013-05-16 DIAGNOSIS — I498 Other specified cardiac arrhythmias: Secondary | ICD-10-CM | POA: Diagnosis not present

## 2013-05-16 DIAGNOSIS — I739 Peripheral vascular disease, unspecified: Secondary | ICD-10-CM

## 2013-05-16 DIAGNOSIS — R001 Bradycardia, unspecified: Secondary | ICD-10-CM

## 2013-05-16 DIAGNOSIS — E119 Type 2 diabetes mellitus without complications: Secondary | ICD-10-CM | POA: Diagnosis not present

## 2013-05-16 HISTORY — DX: Peripheral vascular disease, unspecified: I73.9

## 2013-05-16 HISTORY — DX: Bradycardia, unspecified: R00.1

## 2013-05-16 LAB — GLUCOSE, CAPILLARY

## 2013-05-16 LAB — CBC
Platelets: 287 10*3/uL (ref 150–400)
RBC: 4.31 MIL/uL (ref 3.87–5.11)
WBC: 6.4 10*3/uL (ref 4.0–10.5)

## 2013-05-16 LAB — TROPONIN I: Troponin I: <0.3 ng/mL (ref <0.30)

## 2013-05-16 LAB — BASIC METABOLIC PANEL
Calcium: 9.5 mg/dL (ref 8.4–10.5)
GFR calc Af Amer: 66 mL/min — ABNORMAL LOW (ref >90)
GFR calc non Af Amer: 57 mL/min — ABNORMAL LOW (ref >90)
Potassium: 4.1 mEq/L (ref 3.5–5.1)
Sodium: 138 mEq/L (ref 135–145)

## 2013-05-16 MED ORDER — ALUM & MAG HYDROXIDE-SIMETH 200-200-20 MG/5ML PO SUSP
30.0000 mL | Freq: Four times a day (QID) | ORAL | Status: DC | PRN
  Administered 2013-05-16: 30 mL via ORAL
  Filled 2013-05-16: qty 30

## 2013-05-16 MED ORDER — GI COCKTAIL ~~LOC~~
30.0000 mL | Freq: Once | ORAL | Status: AC
  Administered 2013-05-16: 12:00:00 30 mL via ORAL
  Filled 2013-05-16 (×2): qty 30

## 2013-05-16 MED ORDER — HYDRALAZINE HCL 20 MG/ML IJ SOLN
10.0000 mg | Freq: Once | INTRAMUSCULAR | Status: AC
  Administered 2013-05-16: 10 mg via INTRAVENOUS
  Filled 2013-05-16: qty 1

## 2013-05-16 MED ORDER — PROMETHAZINE HCL 25 MG PO TABS: 12.5000 mg | ORAL_TABLET | ORAL | Status: DC | PRN

## 2013-05-16 MED ORDER — PANTOPRAZOLE SODIUM 40 MG PO TBEC
40.0000 mg | DELAYED_RELEASE_TABLET | Freq: Every day | ORAL | Status: DC
  Administered 2013-05-16 – 2013-05-17 (×2): 40 mg via ORAL
  Filled 2013-05-16 (×2): qty 1

## 2013-05-16 MED ORDER — ASPIRIN 81 MG PO TBEC: 81.0000 mg | DELAYED_RELEASE_TABLET | Freq: Every day | ORAL | Status: DC

## 2013-05-16 MED ORDER — PANTOPRAZOLE SODIUM 40 MG PO TBEC: 40.0000 mg | DELAYED_RELEASE_TABLET | Freq: Every day | ORAL | Status: DC

## 2013-05-16 MED ORDER — ASPIRIN 325 MG PO TBEC: 325.0000 mg | DELAYED_RELEASE_TABLET | Freq: Every day | ORAL | Status: DC

## 2013-05-16 NOTE — Progress Notes (Signed)
Pt developed rt chest discomfort described as indigestion.  Maalox given with improvement but it returned.  ASA 325 is new for her.  I have ordered GI cocktail if it resolves she may go home.  EKG without acute changes from 04/2013.  I also added Protonix and decreased ASA to 81 mg.  She is also anxious, may need ativan.

## 2013-05-16 NOTE — Discharge Summary (Addendum)
Physician Discharge Summary      Patient ID: [name] MRN: [id number] DOB/AGE: Apr 17, 1933 79 y.o.  Admit date: 05/15/2013 Discharge date:05/17/2013  Discharge Diagnoses:  Principal Problem:   PAD (peripheral artery disease),severe calcified pop and tibial peroneal trunk disease bil Active Problems:   S/P angioplasty with stent, 05/15/13, Diamondback rot. atherectomy of high grade segmental popliteal stenosis then Chocolate balloonPTA; then angiosculpt PTA of the prox peroneal with stent-Xpedition    DM   CAD (coronary artery disease), hx of CABG in 1998, last nuc 03/2013 low risk study    Claudication, lifestyle limiting   Bradycardia, mild briefly to the 40s, asymptomatic   Discharged Condition: good  Procedures: 05/15/13  PV angio by Dr. Allyson Sabal and [facility] orbital rotational atherectomy of calcified high-grade segmental popliteal stenosis followed by Chocolate balloon PTA with an excellent angiographic result. Angioscultp PTA of the proximal peroneal followed by stenting using a Xpedition drug-eluting stent and then Angiosculpt of distal peroneal with good runoff collateralizing the posterior tibial artery.  Hospital course:   [name] is a 79 year old married African American female patient of Dr. Bryan Lemma. She has known CAD status post coronary bypass grafting in the past (1998). She has preserved LV function and nonischemic Myoview in April 2014.  Because of claudication she underwent peripheral vascular angiography by Bryan Lemma 02/22/13 revealing significant popliteal and infrapopliteal disease. She has lifestyle limiting claudication. She presented 05/15/13 for diamondback orbital rotational atherectomy +/- PTA +/- stenting of her popliteal +/- tibioperoneal trunk.  Procedure as above.  She tolerated the procedure without complication.  By the next AM she was ready for discharge, but developed Rt. Sided chest discomfort, she felt ist was indigestion.  Maalox helped for short  while then it returned. She was given GI cocktail and Ativan as she seemed anxious about returning home.  ( she is the caregiver for her husband).  She then developed increased pain, on rt side, her BP increased despite receiving home meds.  IV hydralazine was given with improvement.  She was kept another night to monitor.  EKG was stable and troponins have been negative.  Plan for discharge 05/17/13.      Consults: None  Significant Diagnostic Studies:  BMET    Component Value Date/Time   NA 138 05/16/2013 0458   K 4.1 05/16/2013 0458   CL 106 05/16/2013 0458   CO2 22 05/16/2013 0458   GLUCOSE 160* 05/16/2013 0458   BUN 21 05/16/2013 0458   CREATININE 0.94 05/16/2013 0458   CREATININE 1.02 04/26/2013 1415   CALCIUM 9.5 05/16/2013 0458   GFRNONAA 57* 05/16/2013 0458   GFRAA 66* 05/16/2013 0458    CBC    Component Value Date/Time   WBC 6.4 05/16/2013 0458   RBC 4.31 05/16/2013 0458   HGB 11.5* 05/16/2013 0458   HCT 36.0 05/16/2013 0458   PLT 287 05/16/2013 0458   MCV 83.5 05/16/2013 0458   MCH 26.7 05/16/2013 0458   MCHC 31.9 05/16/2013 0458   RDW 13.6 05/16/2013 0458   LYMPHSABS 1.8 10/05/2011 2045   MONOABS 0.7 10/05/2011 2045   EOSABS 0.2 10/05/2011 2045   BASOSABS 0.0 10/05/2011 2045       Discharge Exam: Blood pressure 120/82, pulse 62, temperature 98.1 F (36.7 C), temperature source Oral, resp. rate 20, height 5\' 3"  (1.6 m), weight 171 lb 11.8 oz (77.9 kg), SpO2 97.00%.    Disposition: 01-Home or Self Care     Medication List    ASK your doctor  about these medications       amLODipine-valsartan 10-320 MG per tablet  Commonly known as:  EXFORGE  Take 1 tablet by mouth daily.     CENTRUM SILVER PO  Take 1 tablet by mouth daily.     CO Q 10 PO  Take 1 tablet by mouth at bedtime.     estradiol 1 MG tablet  Commonly known as:  ESTRACE  Take 1 mg by mouth daily.     ezetimibe 10 MG tablet  Commonly known as:  ZETIA  Take 10 mg by mouth daily.     glimepiride 4 MG tablet  Commonly  known as:  AMARYL  Take 4 mg by mouth daily before breakfast.     l-methylfolate-B6-B12 3-35-2 MG Tabs  Commonly known as:  METANX  Take 1 tablet by mouth daily.     LORazepam 0.5 MG tablet  Commonly known as:  ATIVAN  Take 0.5 mg by mouth every 8 (eight) hours as needed for anxiety.     metoprolol succinate 100 MG 24 hr tablet  Commonly known as:  TOPROL-XL  Take 100 mg by mouth daily. Take with or immediately following a meal.     nitroGLYCERIN 0.4 MG SL tablet  Commonly known as:  NITROSTAT  Place 0.4 mg under the tongue every 5 (five) minutes as needed for chest pain.     ONGLYZA 5 MG Tabs tablet  Generic drug:  saxagliptin HCl  Take 5 mg by mouth daily.     PLAVIX 75 MG tablet  Generic drug:  clopidogrel  Take 75 mg by mouth daily.     rosuvastatin 20 MG tablet  Commonly known as:  CRESTOR  Take 20 mg by mouth daily.     sulfanilamide 15 % vaginal cream  Commonly known as:  AVC  Place 1 applicator vaginally daily.      DISCHARGE Instructions:  Call [facility] if any bleeding, swelling or drainage at cath site.  May shower, no tub baths for 48 hours for groin sticks.  Heart Healthy Diabetic Diet.  No lifting for 3 days. No driving for 2 days.   Signed: Leone Brand Nurse Practitioner-Certified [facility] 05/16/2013, 10:51 AM  Time spent on discharge >35 minutes.     05/17/13 11:05am Addendum Multiple minor concerns yesterday are quite likely anxiety related kept her overnight. Otherwise doing well post PTA-Stenting of R LE for claudication. BP & HR stable on current medications. OK for discharge this am.  Corine Shelter PA-C 05/17/2013 11:06 AM

## 2013-05-16 NOTE — Progress Notes (Signed)
Patient given ativan.  Nausea came and went spontaneously.  Chest pain relieved but moderate back pain persists.  Ambulated several times in hall several hundred feet without difficulty.  Awaiting disposition from Cardiology.

## 2013-05-16 NOTE — Progress Notes (Signed)
Subjective:  No CP/SOB/leg pain  Objective:  Temp:  [97.5 F (36.4 C)-98.6 F (37 C)] 98.1 F (36.7 C) (06/04 0820) Pulse Rate:  [51-68] 62 (06/04 0820) Resp:  [20] 20 (06/03 1530) BP: (83-168)/(41-126) 120/82 mmHg (06/04 0820) SpO2:  [91 %-100 %] 97 % (06/04 0820) Weight:  [171 lb 11.8 oz (77.9 kg)] 171 lb 11.8 oz (77.9 kg) (06/04 0020) Weight change:   Intake/Output from previous day: 06/03 0701 - 06/04 0700 In: 1565 [P.O.:440; I.V.:1125] Out: 1200 [Urine:1200]  Intake/Output from this shift:    Physical Exam: General appearance: alert and no distress Neck: no adenopathy, no carotid bruit, no JVD, supple, symmetrical, trachea midline and thyroid not enlarged, symmetric, no tenderness/mass/nodules Lungs: clear to auscultation bilaterally Heart: regular rate and rhythm, S1, S2 normal, no murmur, click, rub or gallop Extremities: extremities normal, atraumatic, no cyanosis or edema  Lab Results: Results for orders placed during the hospital encounter of 05/15/13 (from the past 48 hour(s))  CBC     Status: Abnormal   Collection Time    05/15/13  8:01 AM      Result Value Range   WBC 6.0  4.0 - 10.5 K/uL   RBC 4.32  3.87 - 5.11 MIL/uL   Hemoglobin 11.6 (*) 12.0 - 15.0 g/dL   HCT 40.9  81.1 - 91.4 %   MCV 83.8  78.0 - 100.0 fL   MCH 26.9  26.0 - 34.0 pg   MCHC 32.0  30.0 - 36.0 g/dL   RDW 78.2  95.6 - 21.3 %   Platelets 278  150 - 400 K/uL  BASIC METABOLIC PANEL     Status: Abnormal   Collection Time    05/15/13  8:01 AM      Result Value Range   Sodium 139  135 - 145 mEq/L   Potassium 4.1  3.5 - 5.1 mEq/L   Chloride 104  96 - 112 mEq/L   CO2 25  19 - 32 mEq/L   Glucose, Bld 131 (*) 70 - 99 mg/dL   BUN 35 (*) 6 - 23 mg/dL   Creatinine, Ser 0.86 (*) 0.50 - 1.10 mg/dL   Calcium 9.5  8.4 - 57.8 mg/dL   GFR calc non Af Amer 42 (*) >90 mL/min   GFR calc Af Amer 48 (*) >90 mL/min   Comment:            The eGFR has been calculated     using the CKD EPI equation.    This calculation has not been     validated in all clinical     situations.     eGFR's persistently     <90 mL/min signify     possible Chronic Kidney Disease.  PROTIME-INR     Status: None   Collection Time    05/15/13  8:01 AM      Result Value Range   Prothrombin Time 13.4  11.6 - 15.2 seconds   INR 1.03  0.00 - 1.49  POCT ACTIVATED CLOTTING TIME     Status: None   Collection Time    05/15/13 10:22 AM      Result Value Range   Activated Clotting Time 236    POCT ACTIVATED CLOTTING TIME     Status: None   Collection Time    05/15/13 10:51 AM      Result Value Range   Activated Clotting Time 230    POCT ACTIVATED CLOTTING TIME     Status: None  Collection Time    05/15/13 11:31 AM      Result Value Range   Activated Clotting Time 219    GLUCOSE, CAPILLARY     Status: Abnormal   Collection Time    05/15/13 12:18 PM      Result Value Range   Glucose-Capillary 155 (*) 70 - 99 mg/dL  POCT ACTIVATED CLOTTING TIME     Status: None   Collection Time    05/15/13  1:51 PM      Result Value Range   Activated Clotting Time 181    POCT ACTIVATED CLOTTING TIME     Status: None   Collection Time    05/15/13  2:46 PM      Result Value Range   Activated Clotting Time 170    GLUCOSE, CAPILLARY     Status: Abnormal   Collection Time    05/15/13  5:29 PM      Result Value Range   Glucose-Capillary 260 (*) 70 - 99 mg/dL  GLUCOSE, CAPILLARY     Status: Abnormal   Collection Time    05/15/13  9:27 PM      Result Value Range   Glucose-Capillary 250 (*) 70 - 99 mg/dL   Comment 1 Notify RN     Comment 2 Documented in Chart    BASIC METABOLIC PANEL     Status: Abnormal   Collection Time    05/16/13  4:58 AM      Result Value Range   Sodium 138  135 - 145 mEq/L   Potassium 4.1  3.5 - 5.1 mEq/L   Chloride 106  96 - 112 mEq/L   CO2 22  19 - 32 mEq/L   Glucose, Bld 160 (*) 70 - 99 mg/dL   BUN 21  6 - 23 mg/dL   Comment: DELTA CHECK NOTED   Creatinine, Ser 0.94  0.50 - 1.10 mg/dL    Calcium 9.5  8.4 - 40.9 mg/dL   GFR calc non Af Amer 57 (*) >90 mL/min   GFR calc Af Amer 66 (*) >90 mL/min   Comment:            The eGFR has been calculated     using the CKD EPI equation.     This calculation has not been     validated in all clinical     situations.     eGFR's persistently     <90 mL/min signify     possible Chronic Kidney Disease.  CBC     Status: Abnormal   Collection Time    05/16/13  4:58 AM      Result Value Range   WBC 6.4  4.0 - 10.5 K/uL   RBC 4.31  3.87 - 5.11 MIL/uL   Hemoglobin 11.5 (*) 12.0 - 15.0 g/dL   HCT 81.1  91.4 - 78.2 %   MCV 83.5  78.0 - 100.0 fL   MCH 26.7  26.0 - 34.0 pg   MCHC 31.9  30.0 - 36.0 g/dL   RDW 95.6  21.3 - 08.6 %   Platelets 287  150 - 400 K/uL  GLUCOSE, CAPILLARY     Status: Abnormal   Collection Time    05/16/13  8:12 AM      Result Value Range   Glucose-Capillary 155 (*) 70 - 99 mg/dL    Imaging: Imaging results have been reviewed  Assessment/Plan:   1. Active Problems: 2.   * No active hospital problems. * 3.  Time Spent Directly with Patient:  20 minutes  Length of Stay:  LOS: 1 day   S/P complex RLE percutaneous revascularization procedure with diamondback orbital rotational atherectomy right popliteal artery, angiosculpt & stenting of tibialperoneal trunk using DES. Left groin OK. Labs OK. D/C home on asa and plavix. RLE arterial dopplers then ROV with me to discuss staged LLE intervention.   Runell Gess 05/16/2013, 8:53 AM

## 2013-05-17 DIAGNOSIS — I5030 Unspecified diastolic (congestive) heart failure: Secondary | ICD-10-CM | POA: Diagnosis present

## 2013-05-17 DIAGNOSIS — I10 Essential (primary) hypertension: Secondary | ICD-10-CM | POA: Diagnosis present

## 2013-05-17 DIAGNOSIS — E785 Hyperlipidemia, unspecified: Secondary | ICD-10-CM | POA: Diagnosis not present

## 2013-05-17 DIAGNOSIS — I739 Peripheral vascular disease, unspecified: Secondary | ICD-10-CM

## 2013-05-17 DIAGNOSIS — E1169 Type 2 diabetes mellitus with other specified complication: Secondary | ICD-10-CM | POA: Diagnosis present

## 2013-05-17 DIAGNOSIS — I2510 Atherosclerotic heart disease of native coronary artery without angina pectoris: Secondary | ICD-10-CM | POA: Diagnosis not present

## 2013-05-17 DIAGNOSIS — I70219 Atherosclerosis of native arteries of extremities with intermittent claudication, unspecified extremity: Secondary | ICD-10-CM | POA: Diagnosis not present

## 2013-05-17 LAB — GLUCOSE, CAPILLARY: Glucose-Capillary: 112 mg/dL — ABNORMAL HIGH (ref 70–99)

## 2013-05-17 MED ORDER — LIVING WELL WITH DIABETES BOOK
Freq: Once | Status: AC
  Administered 2013-05-17: 01:00:00
  Filled 2013-05-17: qty 1

## 2013-05-17 NOTE — Progress Notes (Signed)
Subjective:  No chest pain  Objective:  Vital Signs in the last 24 hours: Temp:  [97.6 F (36.4 C)-98.1 F (36.7 C)] 97.6 F (36.4 C) (06/05 0816) Pulse Rate:  [49-62] 59 (06/05 0816) Resp:  [15-24] 24 (06/05 0816) BP: (108-188)/(47-91) 108/58 mmHg (06/05 0816) SpO2:  [95 %-100 %] 100 % (06/05 0816) Weight:  [77.4 kg (170 lb 10.2 oz)] 77.4 kg (170 lb 10.2 oz) (06/05 0532)  Intake/Output from previous day:  Intake/Output Summary (Last 24 hours) at 05/17/13 0933 Last data filed at 05/17/13 0536  Gross per 24 hour  Intake    240 ml  Output   2100 ml  Net  -1860 ml    Physical Exam: General appearance: alert, cooperative and no distress Lungs: clear to auscultation bilaterally Heart: regular rate and rhythm   Rate: 52  Rhythm: normal sinus rhythm and sinus bradycardia  Lab Results:  Recent Labs  05/15/13 0801 05/16/13 0458  WBC 6.0 6.4  HGB 11.6* 11.5*  PLT 278 287    Recent Labs  05/15/13 0801 05/16/13 0458  NA 139 138  K 4.1 4.1  CL 104 106  CO2 25 22  GLUCOSE 131* 160*  BUN 35* 21  CREATININE 1.22* 0.94    Recent Labs  05/16/13 1648 05/16/13 2227  TROPONINI <0.30 <0.30   Hepatic Function Panel No results found for this basename: PROT, ALBUMIN, AST, ALT, ALKPHOS, BILITOT, BILIDIR, IBILI,  in the last 72 hours No results found for this basename: CHOL,  in the last 72 hours  Recent Labs  05/15/13 0801  INR 1.03    Imaging: Imaging results have been reviewed  Cardiac Studies:  Assessment/Plan:   Principal Problem:   PAD,severe calcified pop and tibial peroneal trunk disease bilateraly Active Problems:   PTA/ Stent Rt popliteal and Rt peroneal artery 05/16/13   CAD (coronary artery disease), hx of CABG in 1998, last nuc 03/2013 low risk study    Claudication, lifestyle limiting   DM   Bradycardia, mild briefly to the 40s, asymptomatic   Dyslipidemia   HTN (hypertension)  PLAN: Should be OK for discharge this am. Her daughter will be  home this pm, her husband has dementia and is in day care. Follow up with Dr Allyson Sabal to arrange PTA of lt sided PVD.  Corine Shelter PA-C Beeper [phone] 05/17/2013, 9:33 AM  I have seen and evaluated the patient this AM along with Corine Shelter, PA. I agree with his findings, examination as well as impression recommendations.  Multiple minor concerns yesterday are quite likely anxiety related.   Otherwise doing well post PTA-Stenting of R LE for claudication.    BP & HR stable on current medications.  F/u per protocol.   MD Time with pt: 10 min  HARDING,DAVID W, M.D., M.S. [facility] [address] [place], [place]  [address]  [phone] Pager # [phone] 05/17/2013 10:44 AM

## 2013-05-30 ENCOUNTER — Ambulatory Visit (INDEPENDENT_AMBULATORY_CARE_PROVIDER_SITE_OTHER): Payer: Medicare Other | Admitting: Cardiovascular Disease

## 2013-05-30 ENCOUNTER — Encounter: Payer: Self-pay | Admitting: Cardiovascular Disease

## 2013-05-30 VITALS — BP 102/60 | HR 64 | Ht 63.0 in | Wt 172.8 lb

## 2013-05-30 DIAGNOSIS — I739 Peripheral vascular disease, unspecified: Secondary | ICD-10-CM | POA: Diagnosis not present

## 2013-05-30 MED ORDER — CILOSTAZOL 50 MG PO TABS: 50.0000 mg | ORAL_TABLET | Freq: Two times a day (BID) | ORAL | Status: DC

## 2013-05-30 NOTE — Assessment & Plan Note (Signed)
I angiogrammed her on 6//14 revealing high-grade calcified distal right SFA stenosis with one-vessel runoff via a peroneal that was diffusely diseased. I intervened on her to distal right SFA performed PTA and stenting of her peroneal using a coronary drug-eluting stent. Her claudication does not appear to be improved since the procedure. She has similar disease on the left. .I am going to obtain lower extremity arterial Dopplers to document improvement in blood flow on the right. I'll see her back in 6 months. If she notices significant improvement on the right side with sitter percutaneous evacuation of her left lower extremity.

## 2013-05-30 NOTE — Patient Instructions (Addendum)
Your physician wants you to follow-up in: 6 months. You will receive a reminder letter in the mail two months in advance. If you don't receive a letter, please call our office to schedule the follow-up appointment.   Dr Allyson Sabal has ordered lower extremity dopplers.  Dr Allyson Sabal has ordered a new medication:  Pletal 50mg  one tablet twice a day.  This medication has been sent to [facility]

## 2013-05-30 NOTE — Progress Notes (Signed)
05/30/2013 Harrell Gave   [birth year]  [id number]  Primary Physician Michiel Sites, MD Primary Cardiologist: Runell Gess MD Roseanne Reno   HPI:  [name] is a 79 year old married African American female patient of Dr. Onalee Hua Hardings. She has known CAD status post coronary bypass grafting in the past (1998). She has preserved LV function and nonischemic Myoview. Because of claudication she underwent peripheral vascular angiography and other Bryan Lemma 02/22/13 revealing significant popliteal and infrapopliteal disease. She has lifestyle limiting claudication.on 05/16/13 she underwent diamondback orbital rotational atherectomy, PTA of her distal right SFA and stenting using coronary drug-eluting stent for peroneal artery which was her only patent artery on the right. She does not notice any improvement and claudication since her procedure.   Current Outpatient Prescriptions  Medication Sig Dispense Refill  . amLODipine-valsartan (EXFORGE) 10-320 MG per tablet Take 1 tablet by mouth daily.      Marland Kitchen aspirin 81 MG EC tablet Take 1 tablet (81 mg total) by mouth daily.  30 tablet  0  . clopidogrel (PLAVIX) 75 MG tablet Take 75 mg by mouth daily.      . Coenzyme Q10 (CO Q 10 PO) Take 1 tablet by mouth at bedtime.       Marland Kitchen estradiol (ESTRACE) 1 MG tablet Take 1 mg by mouth daily.      Marland Kitchen ezetimibe (ZETIA) 10 MG tablet Take 10 mg by mouth daily.      Marland Kitchen glimepiride (AMARYL) 4 MG tablet Take 4 mg by mouth daily before breakfast.      . l-methylfolate-B6-B12 (METANX) 3-35-2 MG TABS Take 1 tablet by mouth daily.      Marland Kitchen LORazepam (ATIVAN) 0.5 MG tablet Take 0.5 mg by mouth every 8 (eight) hours as needed for anxiety.       . metoprolol succinate (TOPROL-XL) 100 MG 24 hr tablet Take 100 mg by mouth daily. Take with or immediately following a meal.      . Multiple Vitamins-Minerals (CENTRUM SILVER PO) Take 1 tablet by mouth daily.      . nitroGLYCERIN (NITROSTAT) 0.4 MG SL tablet Place 0.4  mg under the tongue every 5 (five) minutes as needed for chest pain.      . rosuvastatin (CRESTOR) 20 MG tablet Take 20 mg by mouth daily.      . saxagliptin HCl (ONGLYZA) 5 MG TABS tablet Take 5 mg by mouth daily.      Marland Kitchen sulfanilamide (AVC) 15 % vaginal cream Place 1 applicator vaginally daily.      . cilostazol (PLETAL) 50 MG tablet Take 1 tablet (50 mg total) by mouth 2 (two) times daily.  60 tablet  11  . pantoprazole (PROTONIX) 40 MG tablet Take 1 tablet (40 mg total) by mouth daily.  30 tablet  1   No current facility-administered medications for this visit.    Allergies  Allergen Reactions  . Iohexol Shortness Of Breath  . Ivp Dye (Iodinated Diagnostic Agents) Shortness Of Breath  . Shellfish Allergy Shortness Of Breath    All seafood  . Glucophage (Metformin Hcl) Other (See Comments)    Instructed by physician not to take anymore   . Latex     REACTION: wheezing    History   Social History  . Marital Status: Married    Spouse Name: N/A    Number of Children: N/A  . Years of Education: N/A   Occupational History  . Not on file.   Social History Main Topics  .  Smoking status: Never Smoker   . Smokeless tobacco: Never Used  . Alcohol Use: Yes     Comment: rare glass of wine   . Drug Use: No  . Sexually Active: Not on file   Other Topics Concern  . Not on file   Social History Narrative  . No narrative on file     Review of Systems: General: negative for chills, fever, night sweats or weight changes.  Cardiovascular: negative for chest pain, dyspnea on exertion, edema, orthopnea, palpitations, paroxysmal nocturnal dyspnea or shortness of breath Dermatological: negative for rash Respiratory: negative for cough or wheezing Urologic: negative for hematuria Abdominal: negative for nausea, vomiting, diarrhea, bright red blood per rectum, melena, or hematemesis Neurologic: negative for visual changes, syncope, or dizziness All other systems reviewed and are  otherwise negative except as noted above.    Blood pressure 102/60, pulse 64, height 5\' 3"  (1.6 m), weight 172 lb 12.8 oz (78.382 kg).  General appearance: alert and no distress Neck: no adenopathy, no carotid bruit, no JVD, supple, symmetrical, trachea midline and thyroid not enlarged, symmetric, no tenderness/mass/nodules Lungs: clear to auscultation bilaterally Heart: regular rate and rhythm, S1, S2 normal, no murmur, click, rub or gallop Extremities: extremities normal, atraumatic, no cyanosis or edema and hher left femoral artery puncture site is well-healed. She has a 1-2+ palpable posterior tibial pulse on the right.  EKG not performed today  ASSESSMENT AND PLAN:   Claudication, lifestyle limiting I angiogrammed her on 6//14 revealing high-grade calcified distal right SFA stenosis with one-vessel runoff via a peroneal that was diffusely diseased. I intervened on her to distal right SFA performed PTA and stenting of her peroneal using a coronary drug-eluting stent. Her claudication does not appear to be improved since the procedure. She has similar disease on the left. .I am going to obtain lower extremity arterial Dopplers to document improvement in blood flow on the right. I'll see her back in 6 months. If she notices significant improvement on the right side with sitter percutaneous evacuation of her left lower extremity.      Runell Gess MD FACP,FACC,FAHA, [facility] 05/30/2013 12:59 PM

## 2013-06-11 ENCOUNTER — Ambulatory Visit (HOSPITAL_COMMUNITY)
Admission: RE | Admit: 2013-06-11 | Discharge: 2013-06-11 | Disposition: A | Discharge status: Home / Self Care | Payer: Medicare Other | Source: Ambulatory Visit | Attending: Internal Medicine | Admitting: Internal Medicine

## 2013-06-11 DIAGNOSIS — I739 Peripheral vascular disease, unspecified: Secondary | ICD-10-CM

## 2013-06-11 DIAGNOSIS — I7389 Other specified peripheral vascular diseases: Secondary | ICD-10-CM | POA: Insufficient documentation

## 2013-06-11 NOTE — Progress Notes (Signed)
Lower Extremity Arterial Duplex Completed. °[name] ° °

## 2013-07-05 ENCOUNTER — Ambulatory Visit (INDEPENDENT_AMBULATORY_CARE_PROVIDER_SITE_OTHER): Payer: Medicare Other | Admitting: Cardiovascular Disease

## 2013-07-05 ENCOUNTER — Encounter: Payer: Self-pay | Admitting: Cardiovascular Disease

## 2013-07-05 VITALS — BP 100/60 | HR 72 | Ht 63.0 in | Wt 173.7 lb

## 2013-07-05 DIAGNOSIS — I739 Peripheral vascular disease, unspecified: Secondary | ICD-10-CM

## 2013-07-05 DIAGNOSIS — Z01818 Encounter for other preprocedural examination: Secondary | ICD-10-CM | POA: Diagnosis not present

## 2013-07-05 DIAGNOSIS — Z79899 Other long term (current) drug therapy: Secondary | ICD-10-CM | POA: Diagnosis not present

## 2013-07-05 DIAGNOSIS — D689 Coagulation defect, unspecified: Secondary | ICD-10-CM

## 2013-07-05 NOTE — Patient Instructions (Addendum)
Your physician has requested that you have a peripheral vascular angiogram. This exam is performed at the hospital. During this exam IV contrast is used to look at arterial blood flow. Please review the information sheet given for details.   

## 2013-07-05 NOTE — Progress Notes (Signed)
07/05/2013 [name]   September 20, 1933  [id number]  Primary Physician Monica Sites, MD Primary Cardiologist: Monica Gess MD [name]   HPI:  [name] is a 79 year old married African American female patient of [name]. She has known CAD status post coronary bypass grafting in the past (1998). She has preserved LV function and nonischemic Myoview. Because of claudication she underwent peripheral vascular angiography and other [name] 02/22/13 revealing significant popliteal and infrapopliteal disease. She has lifestyle limiting claudication.on 05/16/13 she underwent diamondback orbital rotational atherectomy, PTA of her distal right SFA and stenting using coronary drug-eluting stent for peroneal artery which was her only patent artery on the right. She does not notice any improvement and claudication since her procedure although she says her right leg his less "heavy" and that her left leg is much more symptomatic. Based on this, her anatomy and Doppler findings will proceed with left lower extremity diamondback atherectomy, PT 8+ or minus stenting of her left popliteal artery and possible percutaneous recanalization of her left tibioperoneal trunk.    Current Outpatient Prescriptions  Medication Sig Dispense Refill  . amLODipine-valsartan (EXFORGE) 10-320 MG per tablet Take 1 tablet by mouth daily.      Marland Kitchen aspirin 81 MG EC tablet Take 1 tablet (81 mg total) by mouth daily.  30 tablet  0  . clopidogrel (PLAVIX) 75 MG tablet Take 75 mg by mouth daily.      . Coenzyme Q10 (CO Q 10 PO) Take 1 tablet by mouth at bedtime.       Marland Kitchen estradiol (ESTRACE) 1 MG tablet Take 1 mg by mouth daily.      Marland Kitchen ezetimibe (ZETIA) 10 MG tablet Take 10 mg by mouth daily.      Marland Kitchen glimepiride (AMARYL) 4 MG tablet Take 4 mg by mouth daily before breakfast.      . l-methylfolate-B6-B12 (METANX) 3-35-2 MG TABS Take 1 tablet by mouth daily.      Marland Kitchen LORazepam (ATIVAN) 0.5 MG tablet Take  0.5 mg by mouth every 8 (eight) hours as needed for anxiety.       . metoprolol succinate (TOPROL-XL) 100 MG 24 hr tablet Take 100 mg by mouth daily. Take with or immediately following a meal.      . Multiple Vitamins-Minerals (CENTRUM SILVER PO) Take 1 tablet by mouth daily.      . nitroGLYCERIN (NITROSTAT) 0.4 MG SL tablet Place 0.4 mg under the tongue every 5 (five) minutes as needed for chest pain.      . pantoprazole (PROTONIX) 40 MG tablet Take 1 tablet (40 mg total) by mouth daily.  30 tablet  1  . rosuvastatin (CRESTOR) 20 MG tablet Take 20 mg by mouth daily.      . saxagliptin HCl (ONGLYZA) 5 MG TABS tablet Take 5 mg by mouth daily.      Marland Kitchen sulfanilamide (AVC) 15 % vaginal cream Place 1 applicator vaginally daily.      . cilostazol (PLETAL) 50 MG tablet Take 1 tablet (50 mg total) by mouth 2 (two) times daily.  60 tablet  11   No current facility-administered medications for this visit.    Allergies  Allergen Reactions  . Iohexol Shortness Of Breath  . Ivp Dye (Iodinated Diagnostic Agents) Shortness Of Breath  . Shellfish Allergy Shortness Of Breath    All seafood  . Glucophage (Metformin Hcl) Other (See Comments)    Instructed by physician not to take anymore   . Latex  REACTION: wheezing    History   Social History  . Marital Status: Married    Spouse Name: N/A    Number of Children: N/A  . Years of Education: N/A   Occupational History  . Not on file.   Social History Main Topics  . Smoking status: Never Smoker   . Smokeless tobacco: Never Used  . Alcohol Use: Yes     Comment: rare glass of wine   . Drug Use: No  . Sexually Active: Not on file   Other Topics Concern  . Not on file   Social History Narrative  . No narrative on file     Review of Systems: General: negative for chills, fever, night sweats or weight changes.  Cardiovascular: negative for chest pain, dyspnea on exertion, edema, orthopnea, palpitations, paroxysmal nocturnal dyspnea or  shortness of breath Dermatological: negative for rash Respiratory: negative for cough or wheezing Urologic: negative for hematuria Abdominal: negative for nausea, vomiting, diarrhea, bright red blood per rectum, melena, or hematemesis Neurologic: negative for visual changes, syncope, or dizziness All other systems reviewed and are otherwise negative except as noted above.    Blood pressure 100/60, pulse 72, height 5\' 3"  (1.6 m), weight 173 lb 11.2 oz (78.79 kg).  General appearance: alert and no distress Neck: no adenopathy, no carotid bruit, no JVD, supple, symmetrical, trachea midline and thyroid not enlarged, symmetric, no tenderness/mass/nodules Lungs: clear to auscultation bilaterally Heart: regular rate and rhythm, S1, S2 normal, no murmur, click, rub or gallop Extremities: extremities normal, atraumatic, no cyanosis or edema  EKG not performed today  ASSESSMENT AND PLAN:   Claudication, lifestyle limiting I performed diamondback atherectomy as well as PTA of her right peroneal artery using a coronary juggling stent 05/16/13. Her Dopplers changed minimally that she now says that her left leg is much more symptomatic. She does have a high-frequency signal in her distal left SFA at 535 cm/s with a left ABI of 0.42. I reviewed her and her grandson demonstrated a calcified lesion in her popliteal artery with one vessel runoff via the anterior tibial and an occluded it it tibioperoneal trunk. She presents now for angiography and potential diamondback atherectomy, PTA and stenting of her left popliteal artery and potential recannulization of her tibioperoneal trunk for lifestyle limiting claudication.      Monica Gess MD FACP,FACC,FAHA, [facility] 07/05/2013 4:29 PM

## 2013-07-05 NOTE — Assessment & Plan Note (Signed)
I performed diamondback atherectomy as well as PTA of her right peroneal artery using a coronary juggling stent 05/16/13. Her Dopplers changed minimally that she now says that her left leg is much more symptomatic. She does have a high-frequency signal in her distal left SFA at 535 cm/s with a left ABI of 0.42. I reviewed her and her grandson demonstrated a calcified lesion in her popliteal artery with one vessel runoff via the anterior tibial and an occluded it it tibioperoneal trunk. She presents now for angiography and potential diamondback atherectomy, PTA and stenting of her left popliteal artery and potential recannulization of her tibioperoneal trunk for lifestyle limiting claudication.

## 2013-07-11 DIAGNOSIS — E119 Type 2 diabetes mellitus without complications: Secondary | ICD-10-CM | POA: Diagnosis not present

## 2013-07-11 DIAGNOSIS — I739 Peripheral vascular disease, unspecified: Secondary | ICD-10-CM | POA: Diagnosis not present

## 2013-07-11 DIAGNOSIS — M79609 Pain in unspecified limb: Secondary | ICD-10-CM | POA: Diagnosis not present

## 2013-07-11 DIAGNOSIS — I10 Essential (primary) hypertension: Secondary | ICD-10-CM | POA: Diagnosis not present

## 2013-07-12 DIAGNOSIS — H5310 Unspecified subjective visual disturbances: Secondary | ICD-10-CM | POA: Diagnosis not present

## 2013-07-12 DIAGNOSIS — H01009 Unspecified blepharitis unspecified eye, unspecified eyelid: Secondary | ICD-10-CM | POA: Diagnosis not present

## 2013-07-13 ENCOUNTER — Encounter (HOSPITAL_COMMUNITY): Payer: Self-pay | Admitting: Pharmacy Technician

## 2013-07-16 ENCOUNTER — Other Ambulatory Visit: Payer: Self-pay | Admitting: *Deleted

## 2013-07-16 ENCOUNTER — Ambulatory Visit
Admission: RE | Admit: 2013-07-16 | Discharge: 2013-07-16 | Disposition: A | Discharge status: Home / Self Care | Payer: Medicare Other | Source: Ambulatory Visit | Attending: Cardiovascular Disease | Admitting: Cardiovascular Disease

## 2013-07-16 DIAGNOSIS — Z01818 Encounter for other preprocedural examination: Secondary | ICD-10-CM

## 2013-07-16 DIAGNOSIS — N760 Acute vaginitis: Secondary | ICD-10-CM | POA: Diagnosis not present

## 2013-07-16 DIAGNOSIS — Z91041 Radiographic dye allergy status: Secondary | ICD-10-CM

## 2013-07-16 DIAGNOSIS — D689 Coagulation defect, unspecified: Secondary | ICD-10-CM | POA: Diagnosis not present

## 2013-07-16 DIAGNOSIS — Z79899 Other long term (current) drug therapy: Secondary | ICD-10-CM | POA: Diagnosis not present

## 2013-07-16 DIAGNOSIS — R0602 Shortness of breath: Secondary | ICD-10-CM | POA: Diagnosis not present

## 2013-07-16 LAB — APTT: aPTT: 28 seconds (ref 24–37)

## 2013-07-17 LAB — COMPREHENSIVE METABOLIC PANEL
ALT: 12 U/L (ref 0–35)
CO2: 24 mEq/L (ref 19–32)
Creat: 1.07 mg/dL (ref 0.50–1.10)
Total Bilirubin: 0.4 mg/dL (ref 0.3–1.2)

## 2013-07-17 LAB — CBC
HCT: 32.9 % — ABNORMAL LOW (ref 36.0–46.0)
MCV: 83.5 fL (ref 78.0–100.0)
RBC: 3.94 MIL/uL (ref 3.87–5.11)
RDW: 14.8 % (ref 11.5–15.5)
WBC: 6.1 10*3/uL (ref 4.0–10.5)

## 2013-07-19 ENCOUNTER — Encounter: Payer: Self-pay | Admitting: *Deleted

## 2013-07-20 ENCOUNTER — Encounter (HOSPITAL_COMMUNITY)
Admission: RE | Disposition: A | Discharge status: Home / Self Care | Payer: Self-pay | Source: Ambulatory Visit | Attending: Cardiovascular Disease

## 2013-07-20 ENCOUNTER — Ambulatory Visit (HOSPITAL_COMMUNITY)
Admission: RE | Admit: 2013-07-20 | Discharge: 2013-07-20 | Disposition: A | Discharge status: Home / Self Care | Payer: Medicare Other | Source: Ambulatory Visit | Attending: Cardiovascular Disease | Admitting: Cardiovascular Disease

## 2013-07-20 DIAGNOSIS — I70219 Atherosclerosis of native arteries of extremities with intermittent claudication, unspecified extremity: Secondary | ICD-10-CM | POA: Insufficient documentation

## 2013-07-20 DIAGNOSIS — Z91041 Radiographic dye allergy status: Secondary | ICD-10-CM

## 2013-07-20 DIAGNOSIS — Z888 Allergy status to other drugs, medicaments and biological substances status: Secondary | ICD-10-CM | POA: Diagnosis not present

## 2013-07-20 DIAGNOSIS — Z91040 Latex allergy status: Secondary | ICD-10-CM | POA: Diagnosis not present

## 2013-07-20 DIAGNOSIS — Z7982 Long term (current) use of aspirin: Secondary | ICD-10-CM | POA: Insufficient documentation

## 2013-07-20 DIAGNOSIS — Z7902 Long term (current) use of antithrombotics/antiplatelets: Secondary | ICD-10-CM | POA: Insufficient documentation

## 2013-07-20 DIAGNOSIS — Z79899 Other long term (current) drug therapy: Secondary | ICD-10-CM | POA: Diagnosis not present

## 2013-07-20 DIAGNOSIS — Z01818 Encounter for other preprocedural examination: Secondary | ICD-10-CM

## 2013-07-20 DIAGNOSIS — I7092 Chronic total occlusion of artery of the extremities: Secondary | ICD-10-CM | POA: Insufficient documentation

## 2013-07-20 DIAGNOSIS — Z91013 Allergy to seafood: Secondary | ICD-10-CM | POA: Diagnosis not present

## 2013-07-20 HISTORY — PX: OTHER SURGICAL HISTORY: SHX169

## 2013-07-20 HISTORY — PX: LOWER EXTREMITY ANGIOGRAM: SHX5508

## 2013-07-20 LAB — GLUCOSE, CAPILLARY: Glucose-Capillary: 133 mg/dL — ABNORMAL HIGH (ref 70–99)

## 2013-07-20 SURGERY — ANGIOGRAM, LOWER EXTREMITY
Anesthesia: LOCAL

## 2013-07-20 MED ORDER — DIAZEPAM 5 MG PO TABS
ORAL_TABLET | ORAL | Status: AC
  Administered 2013-07-20: 5 mg via ORAL
  Filled 2013-07-20: qty 1

## 2013-07-20 MED ORDER — ASPIRIN 81 MG PO CHEW: 324.0000 mg | CHEWABLE_TABLET | ORAL | Status: AC

## 2013-07-20 MED ORDER — DIPHENHYDRAMINE HCL 50 MG/ML IJ SOLN: 25.0000 mg | INTRAMUSCULAR | Status: AC

## 2013-07-20 MED ORDER — METHYLPREDNISOLONE SODIUM SUCC 125 MG IJ SOLR: 60.0000 mg | INTRAMUSCULAR | Status: AC

## 2013-07-20 MED ORDER — SODIUM CHLORIDE 0.9 % IV SOLN
INTRAVENOUS | Status: DC
  Administered 2013-07-20: 09:00:00 via INTRAVENOUS

## 2013-07-20 MED ORDER — MORPHINE SULFATE 2 MG/ML IJ SOLN: 1.0000 mg | INTRAMUSCULAR | Status: DC | PRN

## 2013-07-20 MED ORDER — SODIUM CHLORIDE 0.9 % IJ SOLN: 3.0000 mL | INTRAMUSCULAR | Status: DC | PRN

## 2013-07-20 MED ORDER — FENTANYL CITRATE 0.05 MG/ML IJ SOLN
INTRAMUSCULAR | Status: AC
  Filled 2013-07-20: qty 2

## 2013-07-20 MED ORDER — SODIUM CHLORIDE 0.9 % IV SOLN: INTRAVENOUS | Status: AC

## 2013-07-20 MED ORDER — DIAZEPAM 5 MG PO TABS: 5.0000 mg | ORAL_TABLET | ORAL | Status: AC

## 2013-07-20 MED ORDER — DIPHENHYDRAMINE HCL 50 MG/ML IJ SOLN
INTRAMUSCULAR | Status: AC
  Filled 2013-07-20: qty 1

## 2013-07-20 MED ORDER — ONDANSETRON HCL 4 MG/2ML IJ SOLN: 4.0000 mg | Freq: Four times a day (QID) | INTRAMUSCULAR | Status: DC | PRN

## 2013-07-20 MED ORDER — MIDAZOLAM HCL 2 MG/2ML IJ SOLN
INTRAMUSCULAR | Status: AC
  Filled 2013-07-20: qty 2

## 2013-07-20 MED ORDER — LIDOCAINE HCL (PF) 1 % IJ SOLN
INTRAMUSCULAR | Status: AC
  Filled 2013-07-20: qty 30

## 2013-07-20 MED ORDER — FAMOTIDINE IN NACL 20-0.9 MG/50ML-% IV SOLN
20.0000 mg | INTRAVENOUS | Status: AC
  Administered 2013-07-20: 20 mg via INTRAVENOUS

## 2013-07-20 MED ORDER — ACETAMINOPHEN 325 MG PO TABS: 650.0000 mg | ORAL_TABLET | ORAL | Status: DC | PRN

## 2013-07-20 MED ORDER — ASPIRIN 81 MG PO CHEW
CHEWABLE_TABLET | ORAL | Status: AC
  Administered 2013-07-20: 324 mg via ORAL
  Filled 2013-07-20: qty 4

## 2013-07-20 MED ORDER — HEPARIN (PORCINE) IN NACL 2-0.9 UNIT/ML-% IJ SOLN
INTRAMUSCULAR | Status: AC
  Filled 2013-07-20: qty 1000

## 2013-07-20 MED ORDER — METHYLPREDNISOLONE SODIUM SUCC 125 MG IJ SOLR
INTRAMUSCULAR | Status: AC
  Filled 2013-07-20: qty 2

## 2013-07-20 NOTE — H&P (Signed)
   Pt was reexamined and existing H & P reviewed. No changes found.  Runell Gess, MD [facility] 07/20/2013 11:47 AM

## 2013-07-20 NOTE — CV Procedure (Signed)
TALONDA ARTIST is a 79 y.o. female    [id number] LOCATION:  FACILITY: [facility]  PHYSICIAN: [name], M.D. March 25, 1933   DATE OF PROCEDURE:  07/20/2013  DATE OF DISCHARGE:   CARDIAC CATHETERIZATION     History obtained from chart review.Ms. Whitmire is a 79 year old married African American female patient of Dr. Onalee Hua Hardings. She has known CAD status post coronary bypass grafting in the past (1998). She has preserved LV function and nonischemic Myoview. Because of claudication she underwent peripheral vascular angiography and other Bryan Lemma 02/22/13 revealing significant popliteal and infrapopliteal disease. She has lifestyle limiting claudication.on 05/16/13 she underwent diamondback orbital rotational atherectomy, PTA of her distal right SFA and stenting using coronary drug-eluting stent for peroneal artery which was her only patent artery on the right. She does not notice any improvement and claudication since her procedure although she says her right leg his less "heavy" and that her left leg is much more symptomatic. Based on this, her anatomy and Doppler findings will proceed with left lower extremity diamondback atherectomy, PT 8+ or minus stenting of her left popliteal artery and possible percutaneous recanalization of her left tibioperoneal trunk.    PROCEDURE DESCRIPTION:    The patient was brought to the second floor [facility] cath lab in the postabsorptive state. She was premedicated with Valium 5 mg by mouth, IV Versed and fentanyl, IV Benadryl, Pepcid and Benadryl for contrast allergy. Her right groin was prepped and shaved in usual sterile fashion. Xylocaine 1% was used for local anesthesia. A 5 French sheath was inserted into the right common femoral artery using standard Seldinger technique. A 5 French crossover catheter and in no catheters were used to perform selective left lower extremity angiography. Visipaque dye was used for the entirety of the case. A total of 30  cc of contrast was administered to the patient. Retrograded aortic pressure was monitored in the case.   HEMODYNAMICS:    AO SYSTOLIC/AO DIASTOLIC: 151/68    ANGIOGRAPHIC RESULTS:   1. There was a 90% segmental calcified popliteal artery stenosis beyond which was a small intertibial is patent with ostial disease and occluded tibioperoneal trunk. There is a large collateral that arose from the calcified segment that collateralized the posterior tibial.  IMPRESSION:Ms. Cieslinski has high-grade calcified popliteal disease with one-vessel runoff via a small anterior tibial artery that has proximal disease as well. There is no way to distally protect the tibial vessels given the the calcified segment proximity to the anterior tibial takeoff in the setting of diamondback atherectomy. In addition, if angioplasty was performed I don't think that there would be an adequate enough result throughout stenting within IDEV stent. One option would be to performed retrograde access of the posterior tibial and recanalize the tibioperoneal trunk and then performed retrograde diamondback atherectomy of the popliteal artery. The patient will be discharged later today as an outpatient and I will see her back in the office to discuss alternative options.Runell Gess MD, [facility] 07/20/2013 12:25 PM

## 2013-07-28 ENCOUNTER — Other Ambulatory Visit (HOSPITAL_COMMUNITY): Payer: Self-pay | Admitting: Cardiology

## 2013-07-30 NOTE — Telephone Encounter (Signed)
Rx was sent to pharmacy electronically. 

## 2013-08-01 DIAGNOSIS — E119 Type 2 diabetes mellitus without complications: Secondary | ICD-10-CM | POA: Diagnosis not present

## 2013-08-01 DIAGNOSIS — E789 Disorder of lipoprotein metabolism, unspecified: Secondary | ICD-10-CM | POA: Diagnosis not present

## 2013-08-06 ENCOUNTER — Encounter: Payer: Self-pay | Admitting: Cardiovascular Disease

## 2013-08-06 ENCOUNTER — Ambulatory Visit (INDEPENDENT_AMBULATORY_CARE_PROVIDER_SITE_OTHER): Payer: Medicare Other | Admitting: Cardiovascular Disease

## 2013-08-06 VITALS — BP 102/52 | HR 68 | Ht 63.0 in | Wt 172.0 lb

## 2013-08-06 DIAGNOSIS — I739 Peripheral vascular disease, unspecified: Secondary | ICD-10-CM

## 2013-08-06 NOTE — Patient Instructions (Addendum)
Dr Allyson Sabal has referred you to Dr Hoy Finlay at [facility] for retrograde pedal access to the left popliteal.

## 2013-08-06 NOTE — Assessment & Plan Note (Signed)
I performed angioplasty of her distal right SFA/above-the-knee popliteal artery using diamondback atherectomy and chuckle balloon as well as diamondback of the ostium of her peroneal artery with stenting using a drug-eluting stent. The most recent loggia Dopplers revealed a right ABI of 0.83 the left of 0.42 with a high frequencies signal in her distal left SFA/popliteal artery. Leonidas Romberg angiogram her recently with the intent to perform intervention on her left popliteal artery however with one-vessel runoff I do not feel that there was enough room for distal protection and therefore did not want to risk distally embolizing. She does complain of left calf claudication. Her right calf is markedly improved since her procedure.

## 2013-08-06 NOTE — Progress Notes (Signed)
08/06/2013 [name]   [birth year]  [id number]  Primary Physician Michiel Sites, MD Primary Cardiologist: .Hurshel Party   HPI:  [name] is a 79 year old married African American female patient of Dr. Onalee Hua Hardings. She has known CAD status post coronary bypass grafting in the past (1998). She has preserved LV function and nonischemic Myoview. Because of claudication she underwent peripheral vascular angiography and other Bryan Lemma 02/22/13 revealing significant popliteal and infrapopliteal disease. She has lifestyle limiting claudication.on 05/16/13 she underwent diamondback orbital rotational atherectomy, PTA of her distal right SFA and stenting using coronary drug-eluting stent for peroneal artery which was her only patent artery on the right. She does not notice any improvement and claudication since her procedure although she says her right leg his less "heavy" and that her left leg is much more symptomatic. Based on this, her anatomy and Doppler findingsI decided to attempt her case investigation of her left lower extremity.this was done on 07/20/13. Perform the angiogram it became evident that she had very little room beyond the popliteal lesion to allow distal protection of her single vessel runoff and therefore I did not feel comfortable with the risk benefit ratio. I think she would be better served by retrograde access, tibial broach and potential ostial tibial intervention as well as retrograde popliteal arthrectomy plus or minus stenting.   Current Outpatient Prescriptions  Medication Sig Dispense Refill  . amLODipine-valsartan (EXFORGE) 10-320 MG per tablet Take 1 tablet by mouth daily.      Marland Kitchen aspirin 325 MG buffered tablet Take 325 mg by mouth daily.      Marland Kitchen aspirin 81 MG EC tablet Take 1 tablet (81 mg total) by mouth daily.  30 tablet  0  . aspirin EC 325 MG tablet TAKE 1 TABLET (325 MG TOTAL) BY MOUTH DAILY.  30 tablet  11  . cilostazol (PLETAL) 50 MG tablet Take 50 mg by mouth  daily.      . clopidogrel (PLAVIX) 75 MG tablet Take 75 mg by mouth daily.      . Coenzyme Q10 (CO Q 10 PO) Take 1 tablet by mouth at bedtime.       Marland Kitchen estradiol (ESTRACE) 1 MG tablet Take 1 mg by mouth daily.      Marland Kitchen ezetimibe (ZETIA) 10 MG tablet Take 10 mg by mouth daily.      Marland Kitchen glimepiride (AMARYL) 4 MG tablet Take 4 mg by mouth daily before breakfast.      . l-methylfolate-B6-B12 (METANX) 3-35-2 MG TABS Take 1 tablet by mouth daily.      Marland Kitchen LORazepam (ATIVAN) 0.5 MG tablet Take 0.5 mg by mouth every 8 (eight) hours as needed for anxiety.       . metoprolol succinate (TOPROL-XL) 100 MG 24 hr tablet Take 100 mg by mouth daily. Take with or immediately following a meal.      . Multiple Vitamins-Minerals (CENTRUM SILVER PO) Take 1 tablet by mouth daily.      . nitroGLYCERIN (NITROSTAT) 0.4 MG SL tablet Place 0.4 mg under the tongue every 5 (five) minutes as needed for chest pain.      . pantoprazole (PROTONIX) 40 MG tablet Take 1 tablet (40 mg total) by mouth daily.  30 tablet  1  . pregabalin (LYRICA) 75 MG capsule Take 75 mg by mouth 2 (two) times daily.      . rosuvastatin (CRESTOR) 20 MG tablet Take 20 mg by mouth daily.      . saxagliptin HCl (ONGLYZA)  5 MG TABS tablet Take 5 mg by mouth daily.      Marland Kitchen sulfanilamide (AVC) 15 % vaginal cream Place 1 applicator vaginally daily.       No current facility-administered medications for this visit.    Allergies  Allergen Reactions  . Iohexol Shortness Of Breath  . Ivp Dye [Iodinated Diagnostic Agents] Shortness Of Breath  . Shellfish Allergy Shortness Of Breath    All seafood  . Glucophage [Metformin Hcl] Other (See Comments)    Instructed by physician not to take anymore   . Latex     REACTION: wheezing    History   Social History  . Marital Status: Married    Spouse Name: N/A    Number of Children: N/A  . Years of Education: N/A   Occupational History  . Not on file.   Social History Main Topics  . Smoking status: Never  Smoker   . Smokeless tobacco: Never Used  . Alcohol Use: Yes     Comment: rare glass of wine   . Drug Use: No  . Sexual Activity: Not on file   Other Topics Concern  . Not on file   Social History Narrative  . No narrative on file     Review of Systems: General: negative for chills, fever, night sweats or weight changes.  Cardiovascular: negative for chest pain, dyspnea on exertion, edema, orthopnea, palpitations, paroxysmal nocturnal dyspnea or shortness of breath Dermatological: negative for rash Respiratory: negative for cough or wheezing Urologic: negative for hematuria Abdominal: negative for nausea, vomiting, diarrhea, bright red blood per rectum, melena, or hematemesis Neurologic: negative for visual changes, syncope, or dizziness All other systems reviewed and are otherwise negative except as noted above.    Blood pressure 102/52, pulse 68, height 5\' 3"  (1.6 m), weight 172 lb (78.019 kg).  General appearance: alert and no distress Neck: no adenopathy, no carotid bruit, no JVD, supple, symmetrical, trachea midline and thyroid not enlarged, symmetric, no tenderness/mass/nodules Lungs: clear to auscultation bilaterally Heart: regular rate and rhythm, S1, S2 normal, no murmur, click, rub or gallop Extremities: extremities normal, atraumatic, no cyanosis or edema  EKG not performed today  ASSESSMENT AND PLAN:   PAD,severe calcified pop and tibial peroneal trunk disease bilateraly I performed angioplasty of her distal right SFA/above-the-knee popliteal artery using diamondback atherectomy and chuckle balloon as well as diamondback of the ostium of her peroneal artery with stenting using a drug-eluting stent. The most recent loggia Dopplers revealed a right ABI of 0.83 the left of 0.42 with a high frequencies signal in her distal left SFA/popliteal artery. Leonidas Romberg angiogram her recently with the intent to perform intervention on her left popliteal artery however with one-vessel  runoff I do not feel that there was enough room for distal protection and therefore did not want to risk distally embolizing. She does complain of left calf claudication. Her right calf is markedly improved since her procedure.      Runell Gess MD FACP,FACC,FAHA, [facility] 08/06/2013 4:13 PM

## 2013-08-07 DIAGNOSIS — B373 Candidiasis of vulva and vagina: Secondary | ICD-10-CM | POA: Diagnosis not present

## 2013-08-07 DIAGNOSIS — N952 Postmenopausal atrophic vaginitis: Secondary | ICD-10-CM | POA: Diagnosis not present

## 2013-08-07 DIAGNOSIS — R1031 Right lower quadrant pain: Secondary | ICD-10-CM | POA: Diagnosis not present

## 2013-08-22 DIAGNOSIS — Z1212 Encounter for screening for malignant neoplasm of rectum: Secondary | ICD-10-CM | POA: Diagnosis not present

## 2013-08-22 DIAGNOSIS — B3731 Acute candidiasis of vulva and vagina: Secondary | ICD-10-CM | POA: Diagnosis not present

## 2013-08-22 DIAGNOSIS — R1031 Right lower quadrant pain: Secondary | ICD-10-CM | POA: Diagnosis not present

## 2013-08-22 DIAGNOSIS — B373 Candidiasis of vulva and vagina: Secondary | ICD-10-CM | POA: Diagnosis not present

## 2013-08-22 DIAGNOSIS — Z124 Encounter for screening for malignant neoplasm of cervix: Secondary | ICD-10-CM | POA: Diagnosis not present

## 2013-09-05 DIAGNOSIS — Z7982 Long term (current) use of aspirin: Secondary | ICD-10-CM | POA: Diagnosis not present

## 2013-09-05 DIAGNOSIS — I10 Essential (primary) hypertension: Secondary | ICD-10-CM | POA: Diagnosis not present

## 2013-09-05 DIAGNOSIS — E119 Type 2 diabetes mellitus without complications: Secondary | ICD-10-CM | POA: Diagnosis not present

## 2013-09-05 DIAGNOSIS — I739 Peripheral vascular disease, unspecified: Secondary | ICD-10-CM | POA: Diagnosis not present

## 2013-09-25 DIAGNOSIS — Z01812 Encounter for preprocedural laboratory examination: Secondary | ICD-10-CM | POA: Diagnosis not present

## 2013-09-25 DIAGNOSIS — M79609 Pain in unspecified limb: Secondary | ICD-10-CM | POA: Diagnosis not present

## 2013-09-25 DIAGNOSIS — Z7982 Long term (current) use of aspirin: Secondary | ICD-10-CM | POA: Diagnosis not present

## 2013-09-25 DIAGNOSIS — E119 Type 2 diabetes mellitus without complications: Secondary | ICD-10-CM | POA: Diagnosis not present

## 2013-09-25 DIAGNOSIS — Z79899 Other long term (current) drug therapy: Secondary | ICD-10-CM | POA: Diagnosis not present

## 2013-09-25 DIAGNOSIS — I739 Peripheral vascular disease, unspecified: Secondary | ICD-10-CM | POA: Diagnosis not present

## 2013-09-25 DIAGNOSIS — I2510 Atherosclerotic heart disease of native coronary artery without angina pectoris: Secondary | ICD-10-CM | POA: Diagnosis not present

## 2013-10-01 DIAGNOSIS — E119 Type 2 diabetes mellitus without complications: Secondary | ICD-10-CM | POA: Diagnosis not present

## 2013-10-01 DIAGNOSIS — Z23 Encounter for immunization: Secondary | ICD-10-CM | POA: Diagnosis not present

## 2013-10-01 DIAGNOSIS — I2510 Atherosclerotic heart disease of native coronary artery without angina pectoris: Secondary | ICD-10-CM | POA: Diagnosis not present

## 2013-10-01 DIAGNOSIS — I10 Essential (primary) hypertension: Secondary | ICD-10-CM | POA: Diagnosis not present

## 2013-10-01 DIAGNOSIS — I70209 Unspecified atherosclerosis of native arteries of extremities, unspecified extremity: Secondary | ICD-10-CM | POA: Diagnosis not present

## 2013-10-01 DIAGNOSIS — I739 Peripheral vascular disease, unspecified: Secondary | ICD-10-CM | POA: Diagnosis not present

## 2013-10-01 DIAGNOSIS — Z951 Presence of aortocoronary bypass graft: Secondary | ICD-10-CM | POA: Diagnosis not present

## 2013-10-01 DIAGNOSIS — I7092 Chronic total occlusion of artery of the extremities: Secondary | ICD-10-CM | POA: Diagnosis not present

## 2013-10-01 DIAGNOSIS — E785 Hyperlipidemia, unspecified: Secondary | ICD-10-CM | POA: Diagnosis not present

## 2013-10-01 DIAGNOSIS — R0789 Other chest pain: Secondary | ICD-10-CM | POA: Diagnosis not present

## 2013-10-02 DIAGNOSIS — I7092 Chronic total occlusion of artery of the extremities: Secondary | ICD-10-CM | POA: Diagnosis not present

## 2013-10-02 DIAGNOSIS — I739 Peripheral vascular disease, unspecified: Secondary | ICD-10-CM | POA: Diagnosis not present

## 2013-10-02 DIAGNOSIS — I10 Essential (primary) hypertension: Secondary | ICD-10-CM | POA: Diagnosis not present

## 2013-10-02 DIAGNOSIS — I70209 Unspecified atherosclerosis of native arteries of extremities, unspecified extremity: Secondary | ICD-10-CM | POA: Diagnosis not present

## 2013-10-02 DIAGNOSIS — R0789 Other chest pain: Secondary | ICD-10-CM | POA: Diagnosis not present

## 2013-10-02 DIAGNOSIS — E785 Hyperlipidemia, unspecified: Secondary | ICD-10-CM | POA: Diagnosis not present

## 2013-10-02 DIAGNOSIS — E119 Type 2 diabetes mellitus without complications: Secondary | ICD-10-CM | POA: Diagnosis not present

## 2013-10-11 DIAGNOSIS — H2640 Unspecified secondary cataract: Secondary | ICD-10-CM | POA: Diagnosis not present

## 2013-10-11 DIAGNOSIS — H01009 Unspecified blepharitis unspecified eye, unspecified eyelid: Secondary | ICD-10-CM | POA: Diagnosis not present

## 2013-10-11 DIAGNOSIS — E119 Type 2 diabetes mellitus without complications: Secondary | ICD-10-CM | POA: Diagnosis not present

## 2013-10-18 ENCOUNTER — Ambulatory Visit: Payer: Medicare Other | Admitting: Cardiovascular Disease

## 2013-10-30 DIAGNOSIS — E119 Type 2 diabetes mellitus without complications: Secondary | ICD-10-CM | POA: Diagnosis not present

## 2013-10-30 DIAGNOSIS — E789 Disorder of lipoprotein metabolism, unspecified: Secondary | ICD-10-CM | POA: Diagnosis not present

## 2013-11-02 ENCOUNTER — Encounter (HOSPITAL_COMMUNITY): Payer: Medicare Other

## 2013-11-02 ENCOUNTER — Telehealth (HOSPITAL_COMMUNITY): Payer: Self-pay | Admitting: *Deleted

## 2013-11-05 ENCOUNTER — Ambulatory Visit (HOSPITAL_COMMUNITY)
Admission: RE | Admit: 2013-11-05 | Discharge: 2013-11-05 | Disposition: A | Discharge status: Home / Self Care | Payer: Medicare Other | Source: Ambulatory Visit | Attending: Cardiology | Admitting: Cardiology

## 2013-11-05 DIAGNOSIS — I739 Peripheral vascular disease, unspecified: Secondary | ICD-10-CM

## 2013-11-05 DIAGNOSIS — I70209 Unspecified atherosclerosis of native arteries of extremities, unspecified extremity: Secondary | ICD-10-CM | POA: Insufficient documentation

## 2013-11-05 DIAGNOSIS — I70219 Atherosclerosis of native arteries of extremities with intermittent claudication, unspecified extremity: Secondary | ICD-10-CM | POA: Diagnosis not present

## 2013-11-05 NOTE — Progress Notes (Signed)
Arterial Duplex Lower Ext. Completed. Lavalle Skoda, BS, RDMS, RVT  

## 2013-11-06 ENCOUNTER — Encounter: Payer: Self-pay | Admitting: Cardiovascular Disease

## 2013-11-06 ENCOUNTER — Ambulatory Visit: Payer: Medicare Other | Admitting: Gastroenterology

## 2013-11-06 ENCOUNTER — Ambulatory Visit (INDEPENDENT_AMBULATORY_CARE_PROVIDER_SITE_OTHER): Payer: Medicare Other | Admitting: Cardiovascular Disease

## 2013-11-06 VITALS — BP 126/64 | HR 96 | Ht 63.0 in | Wt 172.0 lb

## 2013-11-06 DIAGNOSIS — I2510 Atherosclerotic heart disease of native coronary artery without angina pectoris: Secondary | ICD-10-CM | POA: Diagnosis not present

## 2013-11-06 DIAGNOSIS — I739 Peripheral vascular disease, unspecified: Secondary | ICD-10-CM

## 2013-11-06 NOTE — Patient Instructions (Signed)
  We will see you back in follow up in 6 months with Dr Berry  Dr Berry has ordered lower extremity dopplers in 6 months   

## 2013-11-06 NOTE — Assessment & Plan Note (Signed)
CAD status post coronary artery bypass grafting in 1998 with a negative Myoview stress test 04/03/13. She denies chest pain or shortness of breath.

## 2013-11-06 NOTE — Progress Notes (Signed)
11/06/2013 Harrell Gave   1933-07-09  [id number]  Primary Physician Michiel Sites, MD Primary Cardiologist: Runell Gess MD Roseanne Reno   HPI:  [name] is a 79 year old married African American female patient of Dr. Onalee Hua Hardings. She has known CAD status post coronary bypass grafting in the past (1998). She has preserved LV function and nonischemic Myoview. Because of claudication she underwent peripheral vascular angiography and other Bryan Lemma 02/22/13 revealing significant popliteal and infrapopliteal disease. She has lifestyle limiting claudication.on 05/16/13 she underwent diamondback orbital rotational atherectomy, PTA of her distal right SFA and stenting using coronary drug-eluting stent for peroneal artery which was her only patent artery on the right. She does not notice any improvement and claudication since her procedure although she says her right leg his less "heavy" and that her left leg is much more symptomatic.I restudied her 07/20/13 with the intention of fixing her left leg however I decided to defer this because of anatomic complexity. Ultimately referred her to Dr. Hoy Finlay in [place] performed intervention on her left popliteal and tibioperoneal trunk on 10/01/13. Followup Dopplers performed yesterday were excellent.   Current Outpatient Prescriptions  Medication Sig Dispense Refill  . amLODipine-valsartan (EXFORGE) 10-320 MG per tablet Take 1 tablet by mouth daily.      Marland Kitchen aspirin 81 MG EC tablet Take 1 tablet (81 mg total) by mouth daily.  30 tablet  0  . aspirin EC 325 MG tablet TAKE 1 TABLET (325 MG TOTAL) BY MOUTH DAILY.  30 tablet  11  . cilostazol (PLETAL) 50 MG tablet Take 50 mg by mouth daily.      . clopidogrel (PLAVIX) 75 MG tablet Take 75 mg by mouth daily.      . Coenzyme Q10 (CO Q 10 PO) Take 1 tablet by mouth at bedtime.       Marland Kitchen estradiol (ESTRACE) 1 MG tablet Take 1 mg by mouth daily.      Marland Kitchen ezetimibe (ZETIA) 10 MG tablet  Take 10 mg by mouth daily.      Marland Kitchen glimepiride (AMARYL) 4 MG tablet Take 4 mg by mouth daily before breakfast.      . l-methylfolate-B6-B12 (METANX) 3-35-2 MG TABS Take 1 tablet by mouth daily.      Marland Kitchen LORazepam (ATIVAN) 0.5 MG tablet Take 0.5 mg by mouth every 8 (eight) hours as needed for anxiety.       . metoprolol succinate (TOPROL-XL) 100 MG 24 hr tablet Take 100 mg by mouth daily. Take with or immediately following a meal.      . Multiple Vitamins-Minerals (CENTRUM SILVER PO) Take 1 tablet by mouth daily.      . nitroGLYCERIN (NITROSTAT) 0.4 MG SL tablet Place 0.4 mg under the tongue every 5 (five) minutes as needed for chest pain.      . pantoprazole (PROTONIX) 40 MG tablet Take 1 tablet (40 mg total) by mouth daily.  30 tablet  1  . pregabalin (LYRICA) 75 MG capsule Take 75 mg by mouth 2 (two) times daily.      . rosuvastatin (CRESTOR) 20 MG tablet Take 20 mg by mouth daily.      . saxagliptin HCl (ONGLYZA) 5 MG TABS tablet Take 5 mg by mouth daily.      Marland Kitchen sulfanilamide (AVC) 15 % vaginal cream Place 1 applicator vaginally daily.       No current facility-administered medications for this visit.    Allergies  Allergen Reactions  . Iohexol  Shortness Of Breath  . Ivp Dye [Iodinated Diagnostic Agents] Shortness Of Breath  . Shellfish Allergy Shortness Of Breath    All seafood  . Glucophage [Metformin Hcl] Other (See Comments)    Instructed by physician not to take anymore   . Latex     REACTION: wheezing    History   Social History  . Marital Status: Married    Spouse Name: N/A    Number of Children: N/A  . Years of Education: N/A   Occupational History  . Not on file.   Social History Main Topics  . Smoking status: Never Smoker   . Smokeless tobacco: Never Used  . Alcohol Use: Yes     Comment: rare glass of wine   . Drug Use: No  . Sexual Activity: Not on file   Other Topics Concern  . Not on file   Social History Narrative  . No narrative on file     Review  of Systems: General: negative for chills, fever, night sweats or weight changes.  Cardiovascular: negative for chest pain, dyspnea on exertion, edema, orthopnea, palpitations, paroxysmal nocturnal dyspnea or shortness of breath Dermatological: negative for rash Respiratory: negative for cough or wheezing Urologic: negative for hematuria Abdominal: negative for nausea, vomiting, diarrhea, bright red blood per rectum, melena, or hematemesis Neurologic: negative for visual changes, syncope, or dizziness All other systems reviewed and are otherwise negative except as noted above.    Blood pressure 126/64, pulse 96, height 5\' 3"  (1.6 m), weight 172 lb (78.019 kg).  General appearance: alert and no distress Neck: no adenopathy, no carotid bruit, no JVD, supple, symmetrical, trachea midline and thyroid not enlarged, symmetric, no tenderness/mass/nodules Lungs: clear to auscultation bilaterally Heart: regular rate and rhythm, S1, S2 normal, no murmur, click, rub or gallop Extremities: extremities normal, atraumatic, no cyanosis or edema and plus pedal pulses bilaterally  EKG not performed today  ASSESSMENT AND PLAN:   PAD,severe calcified pop and tibial peroneal trunk disease bilateraly I performed intervention on her above-the-knee popliteal artery on the right as well as on her tibioperoneal trunk 05/15/13. I restudied her several months later with the intention of fixing her left leg however I decided to defer this to Dr. Hoy Finlay to do this on 09/2513 putting him I stent in the left popliteal stent in the tibioperoneal trunk. ABIs performed yesterday revealed 1.92 bilaterally with widely patent stents.  CAD (coronary artery disease), hx of CABG in 1998, last nuc 03/2013 low risk study  CAD status post coronary artery bypass grafting in 1998 with a negative Myoview stress test 04/03/13. She denies chest pain or shortness of breath.      Runell Gess MD FACP,FACC,FAHA,  [facility] 11/06/2013 3:24 PM

## 2013-11-06 NOTE — Assessment & Plan Note (Signed)
I performed intervention on her above-the-knee popliteal artery on the right as well as on her tibioperoneal trunk 05/15/13. I restudied her several months later with the intention of fixing her left leg however I decided to defer this to Dr. Hoy Finlay to do this on 09/2513 putting him I stent in the left popliteal stent in the tibioperoneal trunk. ABIs performed yesterday revealed 1.92 bilaterally with widely patent stents.

## 2013-11-12 HISTORY — PX: CARDIAC CATHETERIZATION: SHX172

## 2013-11-13 DIAGNOSIS — I10 Essential (primary) hypertension: Secondary | ICD-10-CM | POA: Diagnosis not present

## 2013-11-13 DIAGNOSIS — E789 Disorder of lipoprotein metabolism, unspecified: Secondary | ICD-10-CM | POA: Diagnosis not present

## 2013-11-13 DIAGNOSIS — I739 Peripheral vascular disease, unspecified: Secondary | ICD-10-CM | POA: Diagnosis not present

## 2013-11-13 DIAGNOSIS — E119 Type 2 diabetes mellitus without complications: Secondary | ICD-10-CM | POA: Diagnosis not present

## 2013-11-26 ENCOUNTER — Other Ambulatory Visit: Payer: Self-pay | Admitting: *Deleted

## 2013-11-26 ENCOUNTER — Inpatient Hospital Stay (HOSPITAL_COMMUNITY)
Admission: AD | Admit: 2013-11-26 | Discharge: 2013-11-28 | DRG: 287 | Disposition: A | Discharge status: Home / Self Care | Payer: Medicare Other | Source: Ambulatory Visit | Attending: Cardiovascular Disease | Admitting: Cardiovascular Disease

## 2013-11-26 ENCOUNTER — Encounter (HOSPITAL_COMMUNITY): Payer: Self-pay | Admitting: General Practice

## 2013-11-26 ENCOUNTER — Ambulatory Visit (INDEPENDENT_AMBULATORY_CARE_PROVIDER_SITE_OTHER): Payer: Medicare Other | Admitting: Cardiology

## 2013-11-26 ENCOUNTER — Inpatient Hospital Stay (HOSPITAL_COMMUNITY): Payer: Medicare Other

## 2013-11-26 VITALS — BP 114/50 | HR 62 | Ht 63.0 in | Wt 173.0 lb

## 2013-11-26 DIAGNOSIS — I2510 Atherosclerotic heart disease of native coronary artery without angina pectoris: Secondary | ICD-10-CM | POA: Diagnosis not present

## 2013-11-26 DIAGNOSIS — Z8673 Personal history of transient ischemic attack (TIA), and cerebral infarction without residual deficits: Secondary | ICD-10-CM | POA: Diagnosis present

## 2013-11-26 DIAGNOSIS — I10 Essential (primary) hypertension: Secondary | ICD-10-CM | POA: Diagnosis not present

## 2013-11-26 DIAGNOSIS — Z951 Presence of aortocoronary bypass graft: Secondary | ICD-10-CM | POA: Diagnosis not present

## 2013-11-26 DIAGNOSIS — E785 Hyperlipidemia, unspecified: Secondary | ICD-10-CM

## 2013-11-26 DIAGNOSIS — Z95820 Peripheral vascular angioplasty status with implants and grafts: Secondary | ICD-10-CM

## 2013-11-26 DIAGNOSIS — Z7982 Long term (current) use of aspirin: Secondary | ICD-10-CM | POA: Diagnosis not present

## 2013-11-26 DIAGNOSIS — R079 Chest pain, unspecified: Secondary | ICD-10-CM | POA: Insufficient documentation

## 2013-11-26 DIAGNOSIS — Z79899 Other long term (current) drug therapy: Secondary | ICD-10-CM | POA: Diagnosis not present

## 2013-11-26 DIAGNOSIS — I739 Peripheral vascular disease, unspecified: Secondary | ICD-10-CM | POA: Diagnosis present

## 2013-11-26 DIAGNOSIS — E119 Type 2 diabetes mellitus without complications: Secondary | ICD-10-CM | POA: Diagnosis not present

## 2013-11-26 DIAGNOSIS — E1169 Type 2 diabetes mellitus with other specified complication: Secondary | ICD-10-CM | POA: Diagnosis present

## 2013-11-26 DIAGNOSIS — Z7902 Long term (current) use of antithrombotics/antiplatelets: Secondary | ICD-10-CM | POA: Diagnosis not present

## 2013-11-26 DIAGNOSIS — Z9889 Other specified postprocedural states: Secondary | ICD-10-CM

## 2013-11-26 DIAGNOSIS — I25119 Atherosclerotic heart disease of native coronary artery with unspecified angina pectoris: Secondary | ICD-10-CM | POA: Diagnosis present

## 2013-11-26 DIAGNOSIS — I200 Unstable angina: Principal | ICD-10-CM | POA: Diagnosis present

## 2013-11-26 DIAGNOSIS — D638 Anemia in other chronic diseases classified elsewhere: Secondary | ICD-10-CM

## 2013-11-26 DIAGNOSIS — I110 Hypertensive heart disease with heart failure: Secondary | ICD-10-CM | POA: Diagnosis present

## 2013-11-26 DIAGNOSIS — J9819 Other pulmonary collapse: Secondary | ICD-10-CM | POA: Diagnosis not present

## 2013-11-26 LAB — BASIC METABOLIC PANEL
BUN: 20 mg/dL (ref 6–23)
CO2: 27 mEq/L (ref 19–32)
Calcium: 9.4 mg/dL (ref 8.4–10.5)
Chloride: 103 mEq/L (ref 96–112)
Creatinine, Ser: 0.93 mg/dL (ref 0.50–1.10)

## 2013-11-26 LAB — GLUCOSE, CAPILLARY: Glucose-Capillary: 78 mg/dL (ref 70–99)

## 2013-11-26 LAB — TROPONIN I: Troponin I: <0.3 ng/mL (ref <0.30)

## 2013-11-26 MED ORDER — SODIUM CHLORIDE 0.9 % IJ SOLN: 3.0000 mL | INTRAMUSCULAR | Status: DC | PRN

## 2013-11-26 MED ORDER — ACETAMINOPHEN 325 MG PO TABS
650.0000 mg | ORAL_TABLET | ORAL | Status: DC | PRN
  Administered 2013-11-27 (×2): 650 mg via ORAL
  Filled 2013-11-26 (×2): qty 2

## 2013-11-26 MED ORDER — LINAGLIPTIN 5 MG PO TABS
5.0000 mg | ORAL_TABLET | Freq: Every day | ORAL | Status: DC
  Administered 2013-11-28: 5 mg via ORAL
  Filled 2013-11-26 (×4): qty 1

## 2013-11-26 MED ORDER — HEPARIN BOLUS VIA INFUSION
3000.0000 [IU] | Freq: Once | INTRAVENOUS | Status: AC
  Administered 2013-11-26: 3000 [IU] via INTRAVENOUS
  Filled 2013-11-26: qty 3000

## 2013-11-26 MED ORDER — NITROGLYCERIN 0.4 MG SL SUBL
0.4000 mg | SUBLINGUAL_TABLET | SUBLINGUAL | Status: DC | PRN
Start: 2013-11-26 — End: 2013-11-28
  Administered 2013-11-28 (×2): 0.4 mg via SUBLINGUAL

## 2013-11-26 MED ORDER — GLIMEPIRIDE 4 MG PO TABS
4.0000 mg | ORAL_TABLET | Freq: Every day | ORAL | Status: DC
  Administered 2013-11-28: 4 mg via ORAL
  Filled 2013-11-26 (×4): qty 1

## 2013-11-26 MED ORDER — ASPIRIN EC 81 MG PO TBEC
81.0000 mg | DELAYED_RELEASE_TABLET | Freq: Every day | ORAL | Status: DC
  Administered 2013-11-28: 11:00:00 81 mg via ORAL
  Filled 2013-11-26 (×3): qty 1

## 2013-11-26 MED ORDER — PANTOPRAZOLE SODIUM 40 MG PO TBEC
40.0000 mg | DELAYED_RELEASE_TABLET | Freq: Every day | ORAL | Status: DC
  Administered 2013-11-27 – 2013-11-28 (×2): 40 mg via ORAL
  Filled 2013-11-26 (×2): qty 1

## 2013-11-26 MED ORDER — ONDANSETRON HCL 4 MG/2ML IJ SOLN: 4.0000 mg | Freq: Four times a day (QID) | INTRAMUSCULAR | Status: DC | PRN

## 2013-11-26 MED ORDER — CLOPIDOGREL BISULFATE 75 MG PO TABS
75.0000 mg | ORAL_TABLET | Freq: Every day | ORAL | Status: DC
  Administered 2013-11-26 – 2013-11-28 (×3): 75 mg via ORAL
  Filled 2013-11-26 (×3): qty 1

## 2013-11-26 MED ORDER — LORAZEPAM 0.5 MG PO TABS: 0.5000 mg | ORAL_TABLET | Freq: Three times a day (TID) | ORAL | Status: DC | PRN

## 2013-11-26 MED ORDER — SODIUM CHLORIDE 0.9 % IJ SOLN
3.0000 mL | Freq: Two times a day (BID) | INTRAMUSCULAR | Status: DC
  Administered 2013-11-26: 3 mL via INTRAVENOUS

## 2013-11-26 MED ORDER — PREGABALIN 25 MG PO CAPS
75.0000 mg | ORAL_CAPSULE | Freq: Two times a day (BID) | ORAL | Status: DC
  Administered 2013-11-26 – 2013-11-28 (×3): 75 mg via ORAL
  Filled 2013-11-26: qty 3
  Filled 2013-11-26: qty 1
  Filled 2013-11-26: qty 3
  Filled 2013-11-26: qty 1

## 2013-11-26 MED ORDER — NITROGLYCERIN IN D5W 200-5 MCG/ML-% IV SOLN
3.0000 ug/min | INTRAVENOUS | Status: DC
  Administered 2013-11-26: 3 ug/min via INTRAVENOUS
  Filled 2013-11-26: qty 250

## 2013-11-26 MED ORDER — ATORVASTATIN CALCIUM 40 MG PO TABS
40.0000 mg | ORAL_TABLET | Freq: Every day | ORAL | Status: DC
  Administered 2013-11-27: 40 mg via ORAL
  Filled 2013-11-26 (×3): qty 1

## 2013-11-26 MED ORDER — SODIUM CHLORIDE 0.9 % IV SOLN
INTRAVENOUS | Status: DC
  Administered 2013-11-27: 04:00:00 via INTRAVENOUS

## 2013-11-26 MED ORDER — HEPARIN (PORCINE) IN NACL 100-0.45 UNIT/ML-% IJ SOLN
1100.0000 [IU]/h | INTRAMUSCULAR | Status: DC
  Administered 2013-11-26: 1100 [IU]/h via INTRAVENOUS
  Filled 2013-11-26 (×2): qty 250

## 2013-11-26 MED ORDER — SODIUM CHLORIDE 0.9 % IV SOLN: 250.0000 mL | INTRAVENOUS | Status: DC | PRN

## 2013-11-26 MED ORDER — EZETIMIBE 10 MG PO TABS
10.0000 mg | ORAL_TABLET | Freq: Every day | ORAL | Status: DC
  Administered 2013-11-26 – 2013-11-28 (×3): 10 mg via ORAL
  Filled 2013-11-26 (×4): qty 1

## 2013-11-26 MED ORDER — ASPIRIN 81 MG PO CHEW
81.0000 mg | CHEWABLE_TABLET | ORAL | Status: AC
  Administered 2013-11-27: 81 mg via ORAL
  Filled 2013-11-26: qty 1

## 2013-11-26 MED ORDER — METOPROLOL SUCCINATE ER 100 MG PO TB24
100.0000 mg | ORAL_TABLET | Freq: Every day | ORAL | Status: DC
  Administered 2013-11-27 – 2013-11-28 (×2): 100 mg via ORAL
  Filled 2013-11-26 (×3): qty 1

## 2013-11-26 NOTE — Assessment & Plan Note (Signed)
Cath in the AM. Will continue BB, ASA, statin. Will hold ACE-I in anticipation of cath. Will place on IV Heparin and IV NTG

## 2013-11-26 NOTE — Progress Notes (Signed)
11/26/2013 Harrell Gave   March 28, 1933  [id number]  Primary Physicia Michiel Sites, MD Primary Cardiologist: Dr. Herbie Baltimore  HPI:  The patient is a 79 y/o AAF, followed by Dr. Herbie Baltimore, who presents to clinic today, as a walk-in, with a complaint of chest pain.   She has a history of CAD, status post coronary artery bypass grafting in 1998. She recently underwent a NST on 05/01/13 that was interpreted as low risk. It was There was an apical perfusion defect, but no significant ischemia. Her other problems include hypertension, hyperlipidemia, type 2 diabetes, a history of TIA in the past. She also has PVD with  lifestyle-limiting claudication, left worse than right. On 05/16/13 Dr. Allyson Sabal performed diamondback orbital rotational atherectomy, PTA of her distal right SFA and stenting using coronary drug-eluting stent for peroneal artery which was her only patent artery on the right. Dr. Allyson Sabal restudied her on 07/20/13 with the intention of fixing her left leg, however he decided to defer this because of anatomic complexity. He ultimately referred her to Dr. Hoy Finlay in [place] and he performed intervention on her left popliteal and tibioperoneal trunk on 10/01/13. Follow-up doppler studies in our office demonstrated excellent result.  She reports today with chest discomfort x 2 days. The pain feels very similar to her pain prior to undergoing CABG in 1999. It is described as substernal tightness and pressure, radiating to her back. She has been more tired and short of breath over the past week as well. Mild nausea yesterday but no vomiting.  No diaphoresis. She has had frequent belching, which is new for her. The pain occurs at rest, but no significant change with exertion. It has been relieved with ASA. No significant relief with antacids. She was given 1 SL NTG in the office and she had mild improvement in symptoms.    Current Outpatient Prescriptions  Medication Sig Dispense Refill  .  amLODipine-valsartan (EXFORGE) 10-320 MG per tablet Take 1 tablet by mouth daily.      Marland Kitchen aspirin 81 MG EC tablet Take 1 tablet (81 mg total) by mouth daily.  30 tablet  0  . cilostazol (PLETAL) 50 MG tablet Take 50 mg by mouth daily.      . clopidogrel (PLAVIX) 75 MG tablet Take 75 mg by mouth daily.      . Coenzyme Q10 (CO Q 10 PO) Take 1 tablet by mouth at bedtime.       Marland Kitchen estradiol (ESTRACE) 1 MG tablet Take 1 mg by mouth daily.      Marland Kitchen ezetimibe (ZETIA) 10 MG tablet Take 10 mg by mouth daily.      Marland Kitchen glimepiride (AMARYL) 4 MG tablet Take 4 mg by mouth daily before breakfast.      . l-methylfolate-B6-B12 (METANX) 3-35-2 MG TABS Take 1 tablet by mouth daily.      Marland Kitchen LORazepam (ATIVAN) 0.5 MG tablet Take 0.5 mg by mouth every 8 (eight) hours as needed for anxiety.       . metoprolol succinate (TOPROL-XL) 100 MG 24 hr tablet Take 100 mg by mouth daily. Take with or immediately following a meal.      . Multiple Vitamins-Minerals (CENTRUM SILVER PO) Take 1 tablet by mouth daily.      . nitroGLYCERIN (NITROSTAT) 0.4 MG SL tablet Place 0.4 mg under the tongue every 5 (five) minutes as needed for chest pain.      . pantoprazole (PROTONIX) 40 MG tablet Take 1 tablet (40 mg total) by mouth daily.  30 tablet  1  . pregabalin (LYRICA) 75 MG capsule Take 75 mg by mouth 2 (two) times daily.      . rosuvastatin (CRESTOR) 20 MG tablet Take 20 mg by mouth daily.      . saxagliptin HCl (ONGLYZA) 5 MG TABS tablet Take 5 mg by mouth daily.      Marland Kitchen sulfanilamide (AVC) 15 % vaginal cream Place 1 applicator vaginally daily.       No current facility-administered medications for this visit.    Allergies  Allergen Reactions  . Iohexol Shortness Of Breath  . Ivp Dye [Iodinated Diagnostic Agents] Shortness Of Breath  . Shellfish Allergy Shortness Of Breath    All seafood  . Glucophage [Metformin Hcl] Other (See Comments)    Instructed by physician not to take anymore   . Latex     REACTION: wheezing    History    Social History  . Marital Status: Married    Spouse Name: N/A    Number of Children: N/A  . Years of Education: N/A   Occupational History  . Not on file.   Social History Main Topics  . Smoking status: Never Smoker   . Smokeless tobacco: Never Used  . Alcohol Use: Yes     Comment: rare glass of wine   . Drug Use: No  . Sexual Activity: Not on file   Other Topics Concern  . Not on file   Social History Narrative  . No narrative on file     Review of Systems: General: negative for chills, fever, night sweats or weight changes.  Cardiovascular: negative for chest pain, dyspnea on exertion, edema, orthopnea, palpitations, paroxysmal nocturnal dyspnea or shortness of breath Dermatological: negative for rash Respiratory: negative for cough or wheezing Urologic: negative for hematuria Abdominal: negative for nausea, vomiting, diarrhea, bright red blood per rectum, melena, or hematemesis Neurologic: negative for visual changes, syncope, or dizziness All other systems reviewed and are otherwise negative except as noted above.    Blood pressure 114/50, pulse 62, height 5\' 3"  (1.6 m), weight 173 lb (78.472 kg).  General appearance: alert, cooperative and no distress Lungs: clear to auscultation bilaterally Heart: regular rate and rhythm, S1, S2 normal, no murmur, click, rub or gallop Extremities: no LEE Pulses: 2+ and symmetric Skin: warm and dry Neurologic: Grossly normal  EKG NSR. HR 62 bpm. T wave inversions in V1 and V2 (old).  ASSESSMENT AND PLAN:   Chest pain with moderate risk for cardiac etiology Substernal chest pressure/tightness with radiation to her mid back x 2 days, with associated belching, and increased fatigue. She also has several cardiac risk factors including HTN, HLD and DM. She has had moderate relief with ASA and SL NTG. EKG show T-wave abnormalities in V1-V2( not new). Last NST was 5/14 and was low risk.  Plan for admission to [facility] to stepdown. Will  place on IV heparin and IV NTG. Plan for diagnostic LHC in the AM.  Dyslipidemia Will continue on statin and Zetia. Will check a lipid panel in the am.  CAD (coronary artery disease), hx of CABG in 1998, last nuc 03/2013 low risk study  Cath in the AM. Will continue BB, ASA, statin. Will hold ACE-I in anticipation of cath. Will place on IV Heparin and IV NTG  HTN (hypertension) Well controlled in office today. Will continue on BB.     PLAN   79 y/o AAF with a history of CAD, s/p CABG in 1998, HTN, HLD and  DM, presents to clinic with a complaint of chest pain concerning for unstable angina. Pt was also seen by Dr. Tresa Endo. He agrees that she will need admission. We will admit to stepdown. Will place on IV Heparin and IV NTG. Will plan for a LHC in the am. Will make NPO at midnight. Note:This was an extended admission due to social concerns. We had to make arrangements for her husband, who has Alzheimer Dz. She is his only care taker and they have few family and community members available to assist. At least 30 minutes was spent making phone calls to [facility] and social work to establish care for her husband while patient is admitted.   Dineen Kid 11/26/2013 5:49 PM

## 2013-11-26 NOTE — Assessment & Plan Note (Signed)
Will continue on statin and Zetia. Will check a lipid panel in the am.

## 2013-11-26 NOTE — H&P (Signed)
11/26/2013 [name]   1933-11-14  [id number]  Primary Physicia Michiel Sites, MD Primary Cardiologist: Dr. Herbie Baltimore  HPI:  The patient is a 79 y/o AAF, followed by Dr. Herbie Baltimore, who presents to clinic today, as a walk-in, with a complaint of chest pain.   She has a history of CAD, status post coronary artery bypass grafting in 1998. She recently underwent a NST on 05/01/13 that was interpreted as low risk. It was There was an apical perfusion defect, but no significant ischemia. Her other problems include hypertension, hyperlipidemia, type 2 diabetes, a history of TIA in the past. She also has PVD with  lifestyle-limiting claudication, left worse than right. On 05/16/13 Dr. Allyson Sabal performed diamondback orbital rotational atherectomy, PTA of her distal right SFA and stenting using coronary drug-eluting stent for peroneal artery which was her only patent artery on the right. Dr. Allyson Sabal restudied her on 07/20/13 with the intention of fixing her left leg, however he decided to defer this because of anatomic complexity. He ultimately referred her to Dr. Hoy Finlay in [place] and he performed intervention on her left popliteal and tibioperoneal trunk on 10/01/13. Follow-up doppler studies in our office demonstrated excellent result.  She reports today with chest discomfort x 2 days. The pain feels very similar to her pain prior to undergoing CABG in 1999. It is described as substernal tightness and pressure, radiating to her back. She has been more tired and short of breath over the past week as well. Mild nausea yesterday but no vomiting.  No diaphoresis. She has had frequent belching, which is new for her. The pain occurs at rest, but no significant change with exertion. It has been relieved with ASA. No significant relief with antacids. She was given 1 SL NTG in the office and she had mild improvement in symptoms.    Current Outpatient Prescriptions  Medication Sig Dispense Refill  .  amLODipine-valsartan (EXFORGE) 10-320 MG per tablet Take 1 tablet by mouth daily.      Marland Kitchen aspirin 81 MG EC tablet Take 1 tablet (81 mg total) by mouth daily.  30 tablet  0  . cilostazol (PLETAL) 50 MG tablet Take 50 mg by mouth daily.      . clopidogrel (PLAVIX) 75 MG tablet Take 75 mg by mouth daily.      . Coenzyme Q10 (CO Q 10 PO) Take 1 tablet by mouth at bedtime.       Marland Kitchen estradiol (ESTRACE) 1 MG tablet Take 1 mg by mouth daily.      Marland Kitchen ezetimibe (ZETIA) 10 MG tablet Take 10 mg by mouth daily.      Marland Kitchen glimepiride (AMARYL) 4 MG tablet Take 4 mg by mouth daily before breakfast.      . l-methylfolate-B6-B12 (METANX) 3-35-2 MG TABS Take 1 tablet by mouth daily.      Marland Kitchen LORazepam (ATIVAN) 0.5 MG tablet Take 0.5 mg by mouth every 8 (eight) hours as needed for anxiety.       . metoprolol succinate (TOPROL-XL) 100 MG 24 hr tablet Take 100 mg by mouth daily. Take with or immediately following a meal.      . Multiple Vitamins-Minerals (CENTRUM SILVER PO) Take 1 tablet by mouth daily.      . nitroGLYCERIN (NITROSTAT) 0.4 MG SL tablet Place 0.4 mg under the tongue every 5 (five) minutes as needed for chest pain.      . pantoprazole (PROTONIX) 40 MG tablet Take 1 tablet (40 mg total) by mouth daily.  30 tablet  1  . pregabalin (LYRICA) 75 MG capsule Take 75 mg by mouth 2 (two) times daily.      . rosuvastatin (CRESTOR) 20 MG tablet Take 20 mg by mouth daily.      . saxagliptin HCl (ONGLYZA) 5 MG TABS tablet Take 5 mg by mouth daily.      Marland Kitchen sulfanilamide (AVC) 15 % vaginal cream Place 1 applicator vaginally daily.       No current facility-administered medications for this visit.    Allergies  Allergen Reactions  . Iohexol Shortness Of Breath  . Ivp Dye [Iodinated Diagnostic Agents] Shortness Of Breath  . Shellfish Allergy Shortness Of Breath    All seafood  . Glucophage [Metformin Hcl] Other (See Comments)    Instructed by physician not to take anymore   . Latex     REACTION: wheezing    History    Social History  . Marital Status: Married    Spouse Name: N/A    Number of Children: N/A  . Years of Education: N/A   Occupational History  . Not on file.   Social History Main Topics  . Smoking status: Never Smoker   . Smokeless tobacco: Never Used  . Alcohol Use: Yes     Comment: rare glass of wine   . Drug Use: No  . Sexual Activity: Not on file   Other Topics Concern  . Not on file   Social History Narrative  . No narrative on file     Review of Systems: General: negative for chills, fever, night sweats or weight changes.  Cardiovascular: negative for chest pain, dyspnea on exertion, edema, orthopnea, palpitations, paroxysmal nocturnal dyspnea or shortness of breath Dermatological: negative for rash Respiratory: negative for cough or wheezing Urologic: negative for hematuria Abdominal: negative for nausea, vomiting, diarrhea, bright red blood per rectum, melena, or hematemesis Neurologic: negative for visual changes, syncope, or dizziness All other systems reviewed and are otherwise negative except as noted above.    Blood pressure 114/50, pulse 62, height 5\' 3"  (1.6 m), weight 173 lb (78.472 kg).  General appearance: alert, cooperative and no distress Lungs: clear to auscultation bilaterally Heart: regular rate and rhythm, S1, S2 normal, no murmur, click, rub or gallop Extremities: no LEE Pulses: 2+ and symmetric Skin: warm and dry Neurologic: Grossly normal  EKG NSR. HR 62 bpm. T wave inversions in V1 and V2 (old).  ASSESSMENT AND PLAN:   Chest pain with moderate risk for cardiac etiology Substernal chest pressure/tightness with radiation to her mid back x 2 days, with associated belching, and increased fatigue. She also has several cardiac risk factors including HTN, HLD and DM. She has had moderate relief with ASA and SL NTG. EKG show T-wave abnormalities in V1-V2( not new). Last NST was 5/14 and was low risk.  Plan for admission to [facility] to stepdown. Will  place on IV heparin and IV NTG. Plan for diagnostic LHC in the AM.  Dyslipidemia Will continue on statin and Zetia. Will check a lipid panel in the am.  CAD (coronary artery disease), hx of CABG in 1998, last nuc 03/2013 low risk study  Cath in the AM. Will continue BB, ASA, statin. Will hold ACE-I in anticipation of cath. Will place on IV Heparin and IV NTG  HTN (hypertension) Well controlled in office today. Will continue on BB.     PLAN   79 y/o AAF with a history of CAD, s/p CABG in 1998, HTN, HLD and  DM, presents to clinic with a complaint of chest pain concerning for unstable angina. Pt was also seen by Dr. Tresa Endo. He agrees that she will need admission. We will admit to stepdown. Will place on IV Heparin and IV NTG. Will plan for a LHC in the am. Will make NPO at midnight. Note:This was an extended admission due to social concerns. We had to make arrangements for her husband, who has Alzheimer Dz. She is his only care taker and they have few family and community members available to assist. At least 30 minutes was spent making phone calls to [facility] and social work to establish care for her husband while patient is admitted.   Dineen Kid 11/26/2013 5:49 PM  Patient seen and examined. Agree with assessment and plan.  [name] is a very pleasant 79 year old American female who is now 16 years status post CABG revascularization surgery. She has a history of hypertension, hyperlipidemia, type 2 diabetes mellitus, and remote TIA. Recently, she also underwent peripheral intervention to her distal right SFA with Dynabac portable rotational atherectomy and stenting of her peroneal artery Dr. Allyson Sabal. She was seen in the office today as a late working for chest discomfort since Saturday. This also was associated with belching. She feels the pain is similar to her discomfort that she felt prior to undergoing bypass surgery. Nitroglycerin in the office did improve her discomfort but she still has  residual chest fullness. She will now be admitted from the office to the hospital for initiation of anticoagulation therapy and stabilization with plans for followup cardiac catheterization to be done tomorrow by Dr. Bryan Lemma who is the patient's clinical cardiologist.   Lennette Bihari, MD, [facility] 11/26/2013 6:17 PM

## 2013-11-26 NOTE — Patient Instructions (Signed)
Report to [facility] for Admission. Your medicines have already been ordered. You will undergo a heart catheretization tomorrow with Dr. Herbie Baltimore.

## 2013-11-26 NOTE — Assessment & Plan Note (Signed)
Well controlled in office today. Will continue on BB.

## 2013-11-26 NOTE — Assessment & Plan Note (Signed)
Substernal chest pressure/tightness with radiation to her mid back x 2 days, with associated belching, and increased fatigue. She also has several cardiac risk factors including HTN, HLD and DM. She has had moderate relief with ASA and SL NTG. EKG show T-wave abnormalities in V1-V2( not new). Last NST was 5/14 and was low risk.  Plan for admission to [facility] to stepdown. Will place on IV heparin and IV NTG. Plan for diagnostic LHC in the AM.

## 2013-11-26 NOTE — Progress Notes (Signed)
ANTICOAGULATION CONSULT NOTE - Initial Consult  Pharmacy Consult for Heparin Indication: chest pain/ACS  Allergies  Allergen Reactions  . Iohexol Shortness Of Breath  . Ivp Dye [Iodinated Diagnostic Agents] Shortness Of Breath  . Shellfish Allergy Shortness Of Breath    All seafood  . Glucophage [Metformin Hcl] Other (See Comments)    Instructed by physician not to take anymore   . Latex     REACTION: wheezing    Patient Measurements: Height: 5\' 3"  (160 cm) Weight: 166 lb 8 oz (75.524 kg) IBW/kg (Calculated) : 52.4  Vital Signs: Temp: 97.6 F (36.4 C) (12/15 1932) Temp src: Oral (12/15 1932) BP: 143/60 mmHg (12/15 1932) Pulse Rate: 59 (12/15 1932)   Estimated Creatinine Clearance: 42.8 ml/min (by C-G formula based on Cr of 1.07).  Medical History: Past Medical History  Diagnosis Date  . CAD (coronary artery disease)     Hx CABG 1998, neg nuc 4/14; echo 02/22/11-EF 50-55%, mild pul htn, inf wall hypokinesis; nuc 04/03/13-no ischemia  . Hypertension   . PAD (peripheral artery disease)     severe calcified pop and tibial peroneal trunk disease bil  . Hyperlipemia   . Chest pain   . Diabetes mellitus     type 2  . GERD (gastroesophageal reflux disease)   . Claudication, lifestyle limiting 05/16/2013  . S/P angioplasty with stent, 05/15/13, Diamondback rot. atherectomy of high grade segmental popliteal stenosis then Chocolate balloonPTA; then angiosculpt PTA of the prox peroneal with stent-Xpedition  05/16/2013  . Bradycardia, mild briefly to the 40s, asymptomatic 05/16/2013    Assessment: 79 year old female to begin iv heparin for chest pain.  She has a history of CAD s/p CABG in 1999.  Plan is for cath in AM.  Goal of Therapy:  Heparin level 0.3-0.7 units/ml Monitor platelets by anticoagulation protocol: Yes   Plan:  1) Heparin 3000 units iv bolus x 1 2) Heparin drip at 1100 units / hr 3) Heparin level and CBC daily  Thank you. Okey Regal,  PharmD [phone]  Elwin Sleight 11/26/2013,7:42 PM

## 2013-11-26 NOTE — ED Notes (Signed)
Spoke with PA Robbie Lis re: pt needing admission and no one available to care for her husband with dementia.  CSW spoke with pt who gave CSW permission to call her grandaughter, [name] to see if she could assist with arrangements.  CSW called [name]'s employer who agreed to call [facility] and have her to call [name] at the MD's office.

## 2013-11-27 ENCOUNTER — Encounter (HOSPITAL_COMMUNITY)
Admission: AD | Disposition: A | Discharge status: Home / Self Care | Payer: Self-pay | Source: Ambulatory Visit | Attending: Cardiovascular Disease

## 2013-11-27 DIAGNOSIS — I2510 Atherosclerotic heart disease of native coronary artery without angina pectoris: Secondary | ICD-10-CM | POA: Diagnosis not present

## 2013-11-27 DIAGNOSIS — E119 Type 2 diabetes mellitus without complications: Secondary | ICD-10-CM | POA: Diagnosis not present

## 2013-11-27 DIAGNOSIS — Z9889 Other specified postprocedural states: Secondary | ICD-10-CM

## 2013-11-27 DIAGNOSIS — I200 Unstable angina: Secondary | ICD-10-CM | POA: Diagnosis not present

## 2013-11-27 DIAGNOSIS — E785 Hyperlipidemia, unspecified: Secondary | ICD-10-CM | POA: Diagnosis not present

## 2013-11-27 HISTORY — PX: LEFT HEART CATHETERIZATION WITH CORONARY ANGIOGRAM: SHX5451

## 2013-11-27 LAB — BASIC METABOLIC PANEL
BUN: 14 mg/dL (ref 6–23)
Chloride: 103 mEq/L (ref 96–112)
GFR calc Af Amer: >90 mL/min (ref >90)
GFR calc non Af Amer: 80 mL/min — ABNORMAL LOW (ref >90)
Potassium: 4.1 mEq/L (ref 3.5–5.1)
Sodium: 138 mEq/L (ref 135–145)

## 2013-11-27 LAB — CBC
Hemoglobin: 10.6 g/dL — ABNORMAL LOW (ref 12.0–15.0)
MCHC: 31.4 g/dL (ref 30.0–36.0)
MCV: 85.1 fL (ref 78.0–100.0)
RDW: 14 % (ref 11.5–15.5)

## 2013-11-27 LAB — HEMOGLOBIN A1C: Mean Plasma Glucose: 160 mg/dL — ABNORMAL HIGH (ref <117)

## 2013-11-27 LAB — LIPID PANEL
Cholesterol: 145 mg/dL (ref 0–200)
LDL Cholesterol: 85 mg/dL (ref 0–99)
Total CHOL/HDL Ratio: 3.2 RATIO
VLDL: 15 mg/dL (ref 0–40)

## 2013-11-27 LAB — GLUCOSE, CAPILLARY
Glucose-Capillary: 99 mg/dL (ref 70–99)
Glucose-Capillary: 142 mg/dL — ABNORMAL HIGH (ref 70–99)

## 2013-11-27 LAB — PROTIME-INR
INR: 0.99 (ref 0.00–1.49)
Prothrombin Time: 12.9 seconds (ref 11.6–15.2)

## 2013-11-27 LAB — HEPARIN LEVEL (UNFRACTIONATED): Heparin Unfractionated: 0.64 IU/mL (ref 0.30–0.70)

## 2013-11-27 LAB — TROPONIN I
Troponin I: <0.3 ng/mL (ref <0.30)
Troponin I: <0.3 ng/mL (ref <0.30)

## 2013-11-27 SURGERY — LEFT HEART CATHETERIZATION WITH CORONARY ANGIOGRAM
Anesthesia: LOCAL

## 2013-11-27 MED ORDER — HEPARIN SODIUM (PORCINE) 5000 UNIT/ML IJ SOLN
5000.0000 [IU] | Freq: Three times a day (TID) | INTRAMUSCULAR | Status: DC
  Administered 2013-11-28: 06:00:00 5000 [IU] via SUBCUTANEOUS
  Filled 2013-11-27 (×6): qty 1

## 2013-11-27 MED ORDER — FENTANYL CITRATE 0.05 MG/ML IJ SOLN
INTRAMUSCULAR | Status: AC
  Filled 2013-11-27: qty 2

## 2013-11-27 MED ORDER — HEPARIN SODIUM (PORCINE) 1000 UNIT/ML IJ SOLN
INTRAMUSCULAR | Status: AC
  Filled 2013-11-27: qty 1

## 2013-11-27 MED ORDER — FAMOTIDINE IN NACL 20-0.9 MG/50ML-% IV SOLN
INTRAVENOUS | Status: AC
  Filled 2013-11-27: qty 50

## 2013-11-27 MED ORDER — LISINOPRIL 2.5 MG PO TABS
2.5000 mg | ORAL_TABLET | Freq: Every day | ORAL | Status: DC
  Administered 2013-11-27 – 2013-11-28 (×2): 2.5 mg via ORAL
  Filled 2013-11-27 (×3): qty 1

## 2013-11-27 MED ORDER — HEPARIN (PORCINE) IN NACL 2-0.9 UNIT/ML-% IJ SOLN
INTRAMUSCULAR | Status: AC
  Filled 2013-11-27: qty 1000

## 2013-11-27 MED ORDER — MORPHINE SULFATE 2 MG/ML IJ SOLN: 2.0000 mg | INTRAMUSCULAR | Status: DC | PRN

## 2013-11-27 MED ORDER — VERAPAMIL HCL 2.5 MG/ML IV SOLN
INTRAVENOUS | Status: AC
  Filled 2013-11-27: qty 2

## 2013-11-27 MED ORDER — FAMOTIDINE 20 MG PO TABS
20.0000 mg | ORAL_TABLET | ORAL | Status: AC
  Administered 2013-11-27: 20 mg via ORAL
  Filled 2013-11-27: qty 1

## 2013-11-27 MED ORDER — NITROGLYCERIN 0.2 MG/ML ON CALL CATH LAB
INTRAVENOUS | Status: AC
  Filled 2013-11-27: qty 1

## 2013-11-27 MED ORDER — SODIUM CHLORIDE 0.9 % IJ SOLN
3.0000 mL | Freq: Two times a day (BID) | INTRAMUSCULAR | Status: DC
  Administered 2013-11-27: 3 mL via INTRAVENOUS

## 2013-11-27 MED ORDER — SODIUM CHLORIDE 0.9 % IV SOLN
1.0000 mL/kg/h | INTRAVENOUS | Status: AC
  Administered 2013-11-27: 13:00:00 1 mL/kg/h via INTRAVENOUS

## 2013-11-27 MED ORDER — ALBUTEROL SULFATE (5 MG/ML) 0.5% IN NEBU
INHALATION_SOLUTION | RESPIRATORY_TRACT | Status: AC
  Filled 2013-11-27: qty 0.5

## 2013-11-27 MED ORDER — DIPHENHYDRAMINE HCL 50 MG/ML IJ SOLN
25.0000 mg | INTRAMUSCULAR | Status: AC
  Administered 2013-11-27: 25 mg via INTRAVENOUS
  Filled 2013-11-27: qty 1

## 2013-11-27 MED ORDER — FUROSEMIDE 10 MG/ML IJ SOLN
INTRAMUSCULAR | Status: AC
  Filled 2013-11-27: qty 4

## 2013-11-27 MED ORDER — LIDOCAINE HCL (PF) 1 % IJ SOLN
INTRAMUSCULAR | Status: AC
  Filled 2013-11-27: qty 30

## 2013-11-27 MED ORDER — METHYLPREDNISOLONE SODIUM SUCC 125 MG IJ SOLR
125.0000 mg | INTRAMUSCULAR | Status: AC
  Administered 2013-11-27: 125 mg via INTRAVENOUS
  Filled 2013-11-27: qty 2

## 2013-11-27 MED ORDER — ALBUTEROL SULFATE (5 MG/ML) 0.5% IN NEBU
2.5000 mg | INHALATION_SOLUTION | RESPIRATORY_TRACT | Status: DC | PRN
  Administered 2013-11-27: 2.5 mg via RESPIRATORY_TRACT

## 2013-11-27 MED ORDER — DIPHENHYDRAMINE HCL 50 MG/ML IJ SOLN
INTRAMUSCULAR | Status: AC
  Filled 2013-11-27: qty 1

## 2013-11-27 MED ORDER — METHYLPREDNISOLONE SODIUM SUCC 125 MG IJ SOLR
INTRAMUSCULAR | Status: AC
  Filled 2013-11-27: qty 2

## 2013-11-27 MED ORDER — PREDNISONE 20 MG PO TABS
20.0000 mg | ORAL_TABLET | Freq: Four times a day (QID) | ORAL | Status: AC
  Administered 2013-11-27 (×2): 20 mg via ORAL
  Filled 2013-11-27 (×2): qty 1

## 2013-11-27 MED ORDER — DIPHENHYDRAMINE HCL 25 MG PO CAPS
25.0000 mg | ORAL_CAPSULE | Freq: Four times a day (QID) | ORAL | Status: AC
  Administered 2013-11-27 (×2): 25 mg via ORAL
  Filled 2013-11-27 (×2): qty 1

## 2013-11-27 MED ORDER — SODIUM CHLORIDE 0.9 % IV SOLN: 250.0000 mL | INTRAVENOUS | Status: DC | PRN

## 2013-11-27 MED ORDER — MIDAZOLAM HCL 2 MG/2ML IJ SOLN
INTRAMUSCULAR | Status: AC
  Filled 2013-11-27: qty 2

## 2013-11-27 MED ORDER — SODIUM CHLORIDE 0.9 % IJ SOLN: 3.0000 mL | INTRAMUSCULAR | Status: DC | PRN

## 2013-11-27 MED ORDER — INSULIN ASPART 100 UNIT/ML ~~LOC~~ SOLN
0.0000 [IU] | Freq: Three times a day (TID) | SUBCUTANEOUS | Status: DC
  Administered 2013-11-27: 5 [IU] via SUBCUTANEOUS
  Administered 2013-11-28: 3 [IU] via SUBCUTANEOUS

## 2013-11-27 NOTE — CV Procedure (Signed)
CARDIAC CATHETERIZATION REPORT  NAME:  [name]   MRN: [id number] DOB:  [birth year]   ADMIT DATE: 11/26/2013 Procedure Date: 11/27/2013  INTERVENTIONAL CARDIOLOGIST: [name], M.D., MS PRIMARY CARE PROVIDER: Michiel Sites, MD PRIMARY CARDIOLOGIST: [name], MD, MD  PATIENT:  [name] is a 79 y.o. female with CABG in 1998 (LIMA-LAD, SVG-D1, SVG-OM) as well as Bilateral LE PAD (s/p extensive PTA/Stenting).   Seen on 11/26/2013 as a "work-in" patient by Boyce Medici, PA. She reports today with chest discomfort x 2 days. The pain feels very similar to her pain prior to undergoing CABG in 1999. It is described as substernal tightness and pressure, radiating to her back. She has been more tired and short of breath over the past week as well. Mild nausea yesterday but no vomiting. No diaphoresis. She has had frequent belching, which is new for her. The pain occurs at rest, but no significant change with exertion. It has been relieved with ASA. No significant relief with antacids. She was given 1 SL NTG in the office and she had mild improvement in symptoms.   She was seen by Dr. Tresa Endo, who felt that her symptoms were concerning for possible Unstable Angina.  She was directly admitted with plans for cardiac catheterization today.   PRE-OPERATIVE DIAGNOSIS:    Chest Pain with Moderate Risk for Cardiac Etiology (Angina)  Unstable Angina  PROCEDURES PERFORMED:    LEFT HEART CATHETERIZATION WITH NATIVE CORONARY AND GRAFT ANGIOGRAPHY  PROCEDURE:Consent:  Risks of procedure as well as the alternatives and risks of each were explained to the (patient/caregiver).  Consent for procedure obtained. Consent for signed by MD and patient with RN witness -- placed on chart.   PROCEDURE: The patient was brought to the 2nd Floor [facility] in the fasting state and prepped and draped in the usual sterile fashion for Left groin or radial access. A  modified Allen's test with plethysmography was performed, revealing excellent Ulnar artery collateral flow.  Sterile technique was used including antiseptics, cap, gloves, gown, hand hygiene, mask and sheet.  Skin prep: Chlorhexidine.  Time Out: Verified patient identification, verified procedure, site/side was marked, verified correct patient position, special equipment/implants available, medications/allergies/relevent history reviewed, required imaging and test results available.  Performed  Access: LEFT RADIAL Artery; 6 Fr Sheath -- Seldinger technique (Angiocath Micropuncture Kit)  IA Radial Cocktail, IV Heparin   Diagnostic Angiography:  TIG 4.0, JL 3.5, JR 4 catheters advanced and exchanged over a long  Exchange safety J-wire.  Left Coronary Artery Angiography: Not visualized (TIG 4.0, JL3.5 catheters unsuccessful.  JL4 catheter was positioned, but angiography not performed due to respiratory reaction..  Right Coronary Artery & SVG-OM Angiography: TIG 4.0  SVG-Diag and LIMA-LAD Angiograpy: JR4  LV Hemodynamics: JL 3.5  TR Band:  Initial 12 ml @ 1030; repositioned @ 1110 Hours, 14 mL air  MEDICATIONS:  Anesthesia:  Local Lidocaine 2 ml  Sedation:  2 mg IV Versed, 25 mcg IV fentanyl ;   Premedication: IV Solumedrol 125 mcg, Benadryl 25 mg, Pepcid 20 mg  Omnipaque Contrast: 120 ml  Anticoagulation:  IV Heparin 4000 Units Radial Cocktail: 5 mg Verapamil, 400 mcg NTG, 2 ml 2% Lidocaine in 10 ml NS IC Nitroglycerin: 200 mcg x 1 Hypersensitivity Treatment: IV -  Lasix 40 mg, solumedrol 125 mcg, benadryl 25 mg, pepcid 20 mg.  In holding area - albuterol nebulizer    Hemodynamics:  Central Aortic / Mean Pressures: 125/60 mmHg; 84  mmHg  Left Ventricular Pressures / EDP: 124/12 mmHg; 22 mmHg  Left Ventriculography: Not done due to concern for adverse reaction / possible contrast hypersensitivity.    Coronary Anatomy:  Left Main: Native Vessel seen via non-selective  angiography, known to have severe LM, LAD & Circumflex disease.  Heavily calcified on fluoroscopy LIMA-LAD: Subselective angiography revealed widely patent graft to mid LAD that has only minimal disease distally.  Retrograde flow up to the proximal occlusion.  SVG - D1: Moderate caliber graft with mild progression of proximal stenosis - tubular ~50%, but the remainder of the graft is free of disease that fills a small to moderate caliber Diagonal branch with retrograde flow reaching the native LAD.  Left Circumflex: Native vessel not seen.  Retrograde filling from SVG-OM fills distal AV-Groove Circumflex  SVG - OM1: Widely patent Moderate to large caliber graft to an angiographically normal native OM.   RCA: Moderate to large caliber dominant vessel that bifurcates distally into the the Right Posterior Descending Artery and the   Right Posterior AV Groove Branch (RPAV).  Angiographically normal.  RPDA: Moderate caliber vessel that reaches the apex.  Angiographically normal.  RPL Sysytem:The RPAV begins as a moderate caliber vessel that gives of a small RPL1 before bifurcating into a small caliber RPL2 and a small to moderate caliber RPL3. Angiographically normal.   PATIENT DISPOSITION:    The patient was transferred to the PACU holding area in a hemodynamicaly stable, chest pain free condition.  The patient tolerated the procedure well, and there were no complications.  EBL:   < 10 ml  The patient was stable before, during, and after the procedure.  POST-OPERATIVE DIAGNOSIS:    Widely patent LIMA-LAD & SVG-OM with mild progression of proximal stenosis ~40-50% in SVG-D1  Widely patent native RCA with known severe native LCA disease (not visualized)  Moderately elevated LVEDP  Concerning episode prior to the final attempt at [facility] engagement with respiratory distress, coughing, sensation of orthopnea --> relieved after IV treatment with 125 mg Solumedrol, 25 mg Benadryl & 20 mg Pepcit +  40 mg Lasix  (for elevated EDP)  No culprit lesion for her symptoms was found, consider microvascular disease  PLAN OF CARE:  Monitor overnight to ensure that she is stable post procedure - re-dose with Prednisone x 2 additional doses & one further dose of Benadryl.  Will add low dose ACE-I upon d/c for additional BP control / after load reduction that should help with microvascular ischemia  Anticipate d/c in AM if stable   HARDING,DAVID W, M.D., M.S. [facility] [address] [place], [place]  [address]  [phone]  11/27/2013 10:31 AM

## 2013-11-27 NOTE — Progress Notes (Signed)
TR BAND REMOVAL  LOCATION:    left radial  DEFLATED PER PROTOCOL:    yes  TIME BAND OFF / DRESSING APPLIED:    1330   SITE UPON ARRIVAL:    Level 0  SITE AFTER BAND REMOVAL:    Level 0  REVERSE ALLEN'S TEST:     positive  CIRCULATION SENSATION AND MOVEMENT:    Within Normal Limits   yes  COMMENTS:   Tolerated procedure well

## 2013-11-27 NOTE — Interval H&P Note (Signed)
History and Physical Interval Note:  11/27/2013 8:35 AM  [name]  has presented today for surgery, with the diagnosis of UNSTABLE ANGINA: The various methods of treatment have been discussed with the patient and family. After consideration of risks, benefits and other options for treatment, the patient has consented to  Procedure(s): LEFT HEART CATHETERIZATION WITH CORONARY ANGIOGRAM (N/A) as a surgical intervention .  The patient's history has been reviewed, patient examined, no change in status, stable for surgery.  I have reviewed the patient's chart and labs.  Questions were answered to the patient's satisfaction.     Shalayah Beagley W  Cath Lab Visit (complete for each Cath Lab visit)  Clinical Evaluation Leading to the Procedure:   ACS: no  Non-ACS:    Anginal Classification: CCS IV  Anti-ischemic medical therapy: Maximal Therapy (2 or more classes of medications)  Non-Invasive Test Results: No non-invasive testing performed  Prior CABG: Previous CABG

## 2013-11-27 NOTE — Progress Notes (Signed)
Pt watched cath video. Consent has been signed.  IVs inserted times two. Pre-cath fluids have been started. Prednisone, pepcid, & benadryl ordered for dye allergy. Pt given 81 mg of asa pre-cath. Sanda Linger, RN

## 2013-11-28 ENCOUNTER — Ambulatory Visit: Payer: Medicare Other | Admitting: Gastroenterology

## 2013-11-28 DIAGNOSIS — Z9889 Other specified postprocedural states: Secondary | ICD-10-CM

## 2013-11-28 DIAGNOSIS — R079 Chest pain, unspecified: Secondary | ICD-10-CM | POA: Diagnosis not present

## 2013-11-28 DIAGNOSIS — E785 Hyperlipidemia, unspecified: Secondary | ICD-10-CM | POA: Diagnosis not present

## 2013-11-28 DIAGNOSIS — E119 Type 2 diabetes mellitus without complications: Secondary | ICD-10-CM | POA: Diagnosis not present

## 2013-11-28 DIAGNOSIS — I200 Unstable angina: Secondary | ICD-10-CM | POA: Diagnosis not present

## 2013-11-28 DIAGNOSIS — R0789 Other chest pain: Secondary | ICD-10-CM | POA: Insufficient documentation

## 2013-11-28 DIAGNOSIS — I2510 Atherosclerotic heart disease of native coronary artery without angina pectoris: Secondary | ICD-10-CM | POA: Diagnosis not present

## 2013-11-28 DIAGNOSIS — I739 Peripheral vascular disease, unspecified: Secondary | ICD-10-CM | POA: Diagnosis not present

## 2013-11-28 LAB — BASIC METABOLIC PANEL
CO2: 25 mEq/L (ref 19–32)
Creatinine, Ser: 1.2 mg/dL — ABNORMAL HIGH (ref 0.50–1.10)
Glucose, Bld: 202 mg/dL — ABNORMAL HIGH (ref 70–99)
Potassium: 3.9 mEq/L (ref 3.5–5.1)
Sodium: 138 mEq/L (ref 135–145)

## 2013-11-28 LAB — CBC
Hemoglobin: 11.9 g/dL — ABNORMAL LOW (ref 12.0–15.0)
MCH: 27 pg (ref 26.0–34.0)
MCHC: 31.8 g/dL (ref 30.0–36.0)
MCV: 84.8 fL (ref 78.0–100.0)
RBC: 4.41 MIL/uL (ref 3.87–5.11)
RDW: 13.7 % (ref 11.5–15.5)

## 2013-11-28 MED ORDER — LISINOPRIL 2.5 MG PO TABS: 2.5000 mg | ORAL_TABLET | Freq: Every day | ORAL | Status: DC

## 2013-11-28 NOTE — Progress Notes (Signed)
Subjective: Complains of tightness in her back radiating from the right side and also right of her sternum.  Objective: Vital signs in last 24 hours: Temp:  [97.5 F (36.4 C)-98.5 F (36.9 C)] 97.6 F (36.4 C) (12/17 0500) Pulse Rate:  [47-88] 88 (12/17 0500) Resp:  [18-20] 20 (12/17 0500) BP: (120-146)/(51-76) 139/64 mmHg (12/17 0500) SpO2:  [94 %-100 %] 97 % (12/17 0500) Weight:  [167 lb 8.8 oz (76 kg)] 167 lb 8.8 oz (76 kg) (12/17 0000) Last BM Date: 11/26/13  Intake/Output from previous day: 12/16 0701 - 12/17 0700 In: 273.4 [P.O.:120; I.V.:153.4] Out: 1000 [Urine:1000] Intake/Output this shift:    Medications Current Facility-Administered Medications  Medication Dose Route Frequency Provider Last Rate Last Dose  . 0.9 %  sodium chloride infusion   Intravenous Continuous [name] Simmons, PA-C 75 mL/hr at 11/27/13 0400    . 0.9 %  sodium chloride infusion  250 mL Intravenous PRN [name], MD      . acetaminophen (TYLENOL) tablet 650 mg  650 mg Oral Q4H PRN Robbie Lis, PA-C   650 mg at 11/27/13 2046  . albuterol (PROVENTIL) (5 MG/ML) 0.5% nebulizer solution 2.5 mg  2.5 mg Nebulization Q4H PRN [name], MD   2.5 mg at 11/27/13 1106  . aspirin EC tablet 81 mg  81 mg Oral Daily [name] Simmons, PA-C      . atorvastatin (LIPITOR) tablet 40 mg  40 mg Oral q1800 [name] Simmons, PA-C   40 mg at 11/27/13 1848  . clopidogrel (PLAVIX) tablet 75 mg  75 mg Oral Daily [name] Simmons, PA-C   75 mg at 11/27/13 1849  . ezetimibe (ZETIA) tablet 10 mg  10 mg Oral Daily [name] Simmons, PA-C   10 mg at 11/27/13 1850  . glimepiride (AMARYL) tablet 4 mg  4 mg Oral QAC breakfast [name] Simmons, PA-C      . heparin injection 5,000 Units  5,000 Units Subcutaneous Q8H [name], MD   5,000 Units at 11/28/13 [phone]  . insulin aspart (novoLOG) injection 0-9 Units  0-9 Units Subcutaneous TID WC Nada Boozer, NP   3 Units at 11/28/13 0900  . linagliptin  (TRADJENTA) tablet 5 mg  5 mg Oral Daily [name] Simmons, PA-C      . lisinopril (PRINIVIL,ZESTRIL) tablet 2.5 mg  2.5 mg Oral Daily [name], MD   2.5 mg at 11/27/13 1850  . LORazepam (ATIVAN) tablet 0.5 mg  0.5 mg Oral Q8H PRN [name] Simmons, PA-C      . metoprolol succinate (TOPROL-XL) 24 hr tablet 100 mg  100 mg Oral Q breakfast [name] Simmons, PA-C   100 mg at 11/27/13 0849  . morphine 2 MG/ML injection 2 mg  2 mg Intravenous Q1H PRN [name], MD      . nitroGLYCERIN (NITROSTAT) SL tablet 0.4 mg  0.4 mg Sublingual Q5 min PRN [name] Simmons, PA-C   0.4 mg at 11/28/13 0616  . nitroGLYCERIN 0.2 mg/mL in dextrose 5 % infusion  3-30 mcg/min Intravenous Titrated Laurey Morale, MD 1.8 mL/hr at 11/27/13 0416 6 mcg/min at 11/27/13 0416  . ondansetron (ZOFRAN) injection 4 mg  4 mg Intravenous Q6H PRN [name] Simmons, PA-C      . pantoprazole (PROTONIX) EC tablet 40 mg  40 mg Oral Daily [name] Simmons, PA-C   40 mg at 11/27/13 1849  . pregabalin (LYRICA) capsule 75 mg  75 mg Oral BID [name] Simmons, PA-C   75 mg  at 11/27/13 2151  . sodium chloride 0.9 % injection 3 mL  3 mL Intravenous Q12H [name], MD   3 mL at 11/27/13 1930  . sodium chloride 0.9 % injection 3 mL  3 mL Intravenous PRN [name], MD        PE: General appearance: alert, cooperative and no distress Lungs: clear to auscultation bilaterally Heart: regular rate and rhythm, S1, S2 normal, no murmur, click, rub or gallop M/S:  Tender to palpation right, mid thoracic and right of her sternum. Extremities: No LEE Pulses: 2+ and symmetric Skin: Mild ecchymosis at the left radial cath site. Neurologic: Grossly normal  Lab Results:   Recent Labs  11/27/13 0650 11/28/13 0600  WBC 5.4 11.0*  HGB 10.6* 11.9*  HCT 33.8* 37.4  PLT 274 316   BMET  Recent Labs  11/26/13 2008 11/27/13 0605 11/28/13 0600  NA 141 138 138  K 4.2 4.1 3.9  CL 103 103 100  CO2 27 20 25   GLUCOSE 85 105*  202*  BUN 20 14 24*  CREATININE 0.93 0.74 1.20*  CALCIUM 9.4 8.7 9.6   PT/INR  Recent Labs  11/27/13 0605  LABPROT 12.9  INR 0.99   Cholesterol  Recent Labs  11/27/13 0605  CHOL 145   Lipid Panel     Component Value Date/Time   CHOL 145 11/27/2013 0605   TRIG 77 11/27/2013 0605   HDL 45 11/27/2013 0605   CHOLHDL 3.2 11/27/2013 0605   VLDL 15 11/27/2013 0605   LDLCALC 85 11/27/2013 0605    Cardiac Enzymes No components found with this basename: TROPONIN,  CKMB,   Studies/Results: POST-OPERATIVE DIAGNOSIS:  Widely patent LIMA-LAD & SVG-OM with mild progression of proximal stenosis ~40-50% in SVG-D1  Widely patent native RCA with known severe native LCA disease (not visualized)  Moderately elevated LVEDP  Concerning episode prior to the final attempt at [facility] engagement with respiratory distress, coughing, sensation of orthopnea --> relieved after IV treatment with 125 mg Solumedrol, 25 mg Benadryl & 20 mg Pepcit + 40 mg Lasix (for elevated EDP)  No culprit lesion for her symptoms was found, consider microvascular disease  Assessment/Plan   Active Problems:   DM   CAD (coronary artery disease), hx of CABG in 1998, last nuc 03/2013 low risk study    Dyslipidemia   HTN (hypertension)   Chest pain with moderate risk for cardiac etiology   Unstable angina   S/P cardiac cath, 11/27/13, stable CAD, possible microvascular disease causing pain  Plan:  SP LHC revealing patent LAD and OM grafts, and 40-50% SVG-D1.  No culprit lesion identified.  ACE added.  Current pain is musculoskeletal and different from the pain she came in with.  Consider ranexa.   Can try short term antiinflammatory if pain does not improve.   LOS: 2 days    HAGER, BRYAN 11/28/2013 9:28 AM   Agree with note written by [facility]  Admitted with atypical CP. Enz neg. Neg cath. Left radial puncture site OK. D/C home. ROV with Dr. Herbie Baltimore.   Runell Gess 11/28/2013 10:18 AM

## 2013-11-28 NOTE — Discharge Summary (Signed)
Physician Discharge Summary  Patient ID: KEVEN SOUCY MRN: [id number] DOB/AGE: 1933/03/03 79 y.o.  Admit date: 11/26/2013 Discharge date: 11/28/2013  Admission Diagnoses: Chest Pain with Moderate Risk for Cardiac Etiology  Discharge Diagnoses:  Principal Problem:   Chest pain with low risk for cardiac etiology- patent grafts on cath 11/26/13; ? microvascular ischemia Active Problems:   DM   CAD (coronary artery disease), hx of CABG in 1998, last nuc 03/2013 low risk study    Dyslipidemia   HTN (hypertension)   Unstable angina   S/P cardiac cath, 11/27/13, stable CAD, possible microvascular disease causing pain   Discharged Condition: Stable  Hospital Course: The patient is a 79 y/o AAFAAF, with a past history of CAD, s/p CABG in 1999, as well as PVD, undergoing multiple lower extremity interventions in the past, HTN and DM. Her primary cardiologist is Dr. Herbie Baltimore, but Dr. Allyson Sabal follows her PVD. She was seen in clinic on 11/26/13 for evaluation of active chest pain. She was seen by Dr. Tresa Endo and it was decided to directly admit to [facility], as her symptoms were concerning for unstable angina. She was admitted to stepdown an placed on IV heparin and IV NTG. She ultimately ruled out for MI with negative cardiac enzymes x 3. She underwent a LHC by Dr. Herbie Baltimore, via the right radial artery. She was found to have a widely patent LIMA-LAD & SVG-OM with mild progression of proximal stenosis ~40-50% in SVG-D1. Widely patent native RCA with known severe native LCA disease. No culprit lesion for her symptoms was found. Microvascular disease was considered. She had moderately elevated LVEDP. Near the end of the procedure, the patient developed mild respiratory distress, coughing and a sensation of orthopnea. She received a treatment of IV Solumedrol, Benadryl and Pepcid. Her symptoms resolved. She left the cath lab in stable condition. She was kept ovenight for further observation and hydration. She was re-dosed  with Prenisone x 2 and Benadryl. Lasix was given for levated EDP. She had no further episodes. Low dose ACE-I was added for additional BP control/ after load reduction, in the hopes of helping with possible microvascular ischemia. The right radial access site remained stable, free from complication. She was last seen and examined by Dr. Allyson Sabal, who determined she was stable for discharge home. She will f/u in clinic with Dr. Herbie Baltimore.   Consults: None  Significant Diagnostic Studies:   LHC 11/27/13 Hemodynamics:  Central Aortic / Mean Pressures: 125/60 mmHg; 84 mmHg  Left Ventricular Pressures / EDP: 124/12 mmHg; 22 mmHg Left Ventriculography: Not done due to concern for adverse reaction / possible contrast hypersensitivity.  Coronary Anatomy:  Left Main: Native Vessel seen via non-selective angiography, known to have severe LM, LAD & Circumflex disease. Heavily calcified on fluoroscopy LIMA-LAD: Subselective angiography revealed widely patent graft to mid LAD that has only minimal disease distally. Retrograde flow up to the proximal occlusion.  SVG - D1: Moderate caliber graft with mild progression of proximal stenosis - tubular ~50%, but the remainder of the graft is free of disease that fills a small to moderate caliber Diagonal branch with retrograde flow reaching the native LAD. Left Circumflex: Native vessel not seen. Retrograde filling from SVG-OM fills distal AV-Groove Circumflex  SVG - OM1: Widely patent Moderate to large caliber graft to an angiographically normal native OM. RCA: Moderate to large caliber dominant vessel that bifurcates distally into the the Right Posterior Descending Artery and the Right Posterior AV Groove Branch (RPAV). Angiographically normal.  RPDA: Moderate caliber  vessel that reaches the apex. Angiographically normal.  RPL Sysytem:The RPAV begins as a moderate caliber vessel that gives of a small RPL1 before bifurcating into a small caliber RPL2 and a small to  moderate caliber RPL3. Angiographically normal.   Treatments: See Hospital Course  Discharge Exam: Blood pressure 139/64, pulse 88, temperature 97.6 F (36.4 C), temperature source Oral, resp. rate 20, height 5\' 3"  (1.6 m), weight 167 lb 8.8 oz (76 kg), SpO2 97.00%.   Disposition: 01-Home or Self Care      Discharge Orders   Future Appointments Provider Department Dept Phone   11/28/2013 2:15 PM Louis Meckel, MD [facility] [phone]   12/03/2013 10:00 AM [name], MD [facility] [phone]   Future Orders Complete By Expires   Diet - low sodium heart healthy  As directed    Driving Restrictions  As directed    Comments:     No driving for 2 days   Increase activity slowly  As directed    Lifting restrictions  As directed    Comments:     No lifting more than 1/2 gallon of milk for 2 days.       Medication List    STOP taking these medications       amLODipine-valsartan 10-320 MG per tablet  Commonly known as:  EXFORGE      TAKE these medications       aspirin 81 MG EC tablet  Take 1 tablet (81 mg total) by mouth daily.     CENTRUM SILVER PO  Take 1 tablet by mouth daily.     cilostazol 50 MG tablet  Commonly known as:  PLETAL  Take 50 mg by mouth daily.     CO Q 10 PO  Take 1 tablet by mouth at bedtime.     estradiol 1 MG tablet  Commonly known as:  ESTRACE  Take 1 mg by mouth daily.     ezetimibe 10 MG tablet  Commonly known as:  ZETIA  Take 10 mg by mouth daily.     glimepiride 4 MG tablet  Commonly known as:  AMARYL  Take 4 mg by mouth daily before breakfast.     l-methylfolate-B6-B12 3-35-2 MG Tabs  Commonly known as:  METANX  Take 1 tablet by mouth daily.     lisinopril 2.5 MG tablet  Commonly known as:  PRINIVIL,ZESTRIL  Take 1 tablet (2.5 mg total) by mouth daily.     LORazepam 0.5 MG tablet  Commonly known as:  ATIVAN  Take 0.5 mg by mouth every 8 (eight) hours as needed for  anxiety.     metoprolol succinate 100 MG 24 hr tablet  Commonly known as:  TOPROL-XL  Take 100 mg by mouth daily. Take with or immediately following a meal.     nitroGLYCERIN 0.4 MG SL tablet  Commonly known as:  NITROSTAT  Place 0.4 mg under the tongue every 5 (five) minutes as needed for chest pain.     ONGLYZA 5 MG Tabs tablet  Generic drug:  saxagliptin HCl  Take 5 mg by mouth daily.     pantoprazole 40 MG tablet  Commonly known as:  PROTONIX  Take 1 tablet (40 mg total) by mouth daily.     PLAVIX 75 MG tablet  Generic drug:  clopidogrel  Take 75 mg by mouth daily.     pregabalin 75 MG capsule  Commonly known as:  LYRICA  Take 75 mg by mouth  every evening.     rosuvastatin 20 MG tablet  Commonly known as:  CRESTOR  Take 20 mg by mouth daily.     sulfanilamide 15 % vaginal cream  Commonly known as:  AVC  Place 1 applicator vaginally daily.       Follow-up Information   Follow up with [name], MD On 12/03/2013. (10:00 am )    Specialty:  Cardiology   Contact information:   [address] [place] [address] [phone]      TIME SPENT ON DISCHARGE, INCLUDING PHYSICIAN TIME: >30 MINUTES  Signed: Allayne Butcher, PA-C 11/28/2013, 11:13 AM

## 2013-12-03 ENCOUNTER — Encounter: Payer: Self-pay | Admitting: Cardiology

## 2013-12-03 ENCOUNTER — Ambulatory Visit (INDEPENDENT_AMBULATORY_CARE_PROVIDER_SITE_OTHER): Payer: Medicare Other | Admitting: Cardiology

## 2013-12-03 VITALS — BP 120/62 | HR 68 | Ht 62.0 in | Wt 171.5 lb

## 2013-12-03 DIAGNOSIS — E785 Hyperlipidemia, unspecified: Secondary | ICD-10-CM

## 2013-12-03 DIAGNOSIS — I10 Essential (primary) hypertension: Secondary | ICD-10-CM | POA: Diagnosis not present

## 2013-12-03 DIAGNOSIS — R079 Chest pain, unspecified: Secondary | ICD-10-CM

## 2013-12-03 DIAGNOSIS — I2510 Atherosclerotic heart disease of native coronary artery without angina pectoris: Secondary | ICD-10-CM

## 2013-12-03 MED ORDER — ISOSORBIDE MONONITRATE ER 30 MG PO TB24: 30.0000 mg | ORAL_TABLET | Freq: Every day | ORAL | Status: DC

## 2013-12-03 NOTE — Progress Notes (Signed)
PCP: Michiel Sites, MD  Clinic Note: Chief Complaint  Patient presents with  . Follow-up    no chest pain , no sob, no edema; left radial bruising -notice a cough -scratchy   HPI: [name] is a 79 y.o. female with a Cardiovascular Problem List below who presents today for followup after her recent hospitalization with catheterization. She is a very pleasant 79 year old woman who is well known to me. I have referred her to Dr. Allyson Sabal for PAD, she was then referred to Dr. Hoy Finlay for completion of lower 70 revascularization intervention on the below the knee vessels on the left. She's been doing much better from an overall claudication standpoint, however came in last week not feeling well, and noted some chest discomfort. She was referred for cardiac catheterization. Because of her urgency she was admitted for overnight hydration as well as premedication for contrast reaction. Her catheterization revealed no significant changes to her coronary anatomy. Unfortunately I was unable to engage her left coronary area to confirm the disease and that vessel that was previously noted. She began having a reaction to the contrast media. As we had adequate imaging to reveal patent grafts, no further angiography was performed.  There was consideration for possible microvascular ischemia with moderately elevated LV EDP. I actually did start her on low-dose of ACE inhibitor for afterload reduction but this was somehow not continued on her Exforge in the hospital.. See summary below.  Interval History: Overall, she is doing relatively well after catheterization, the only comment she notices is some bruising on her right wrist. No further chest tightness or pressure. No more symptoms an something "rolling around on her chest.  She still gets some mild claudication symptoms but much improved from before. Otherwise denies any exertional dyspnea or chest tightness/pressure. No PND, orthopnea or edema. No  lightheadedness, dizziness, wooziness or syncope/near-syncope. No TIA or amaurosis fugax symptoms. No melena, hematochezia or hematuria. No nosebleeds.  Past Medical History  Diagnosis Date  . CAD (coronary artery disease) 1998    Hx CABG 1998, neg nuc 4/14; echo 02/22/11-EF 50-55%, mild pul htn, inf wall hypokinesis; nuc 04/03/13-no ischemia; Cath 11/2013 - Non-obstructive CAD  . Hypertension   . PAD (peripheral artery disease)     severe calcified pop and tibial peroneal trunk disease bil  . Hyperlipemia   . Chest pain   . DM (diabetes mellitus), type 2 with peripheral vascular complications     On oral medications  . GERD (gastroesophageal reflux disease)   . Claudication, lifestyle limiting 05/16/2013  . S/P angioplasty with stent, 05/15/13, Diamondback rot. atherectomy of high grade segmental popliteal stenosis then Chocolate balloonPTA; then angiosculpt PTA of the prox peroneal with stent-Xpedition  05/16/2013  . Bradycardia, mild briefly to the 40s, asymptomatic 05/16/2013  . Peptic ulcer disease   . Chronic anemia    Prior Cardiac Evaluation and Past Surgical History: Past Surgical History  Procedure Laterality Date  . Cardiac catheterization  07/17/01    2 v CAD with LM, circ, LAD, obtuse diag, mod stenosis of diag vein graft , mild stenosis of marg vein graft, nl EF, medical treatment  . Coronary angioplasty  1992    PCI to LAD  . Coronary artery bypass graft  1998    LIMA-LAD, SVG-diagonal (none roughly 50% stenosis), SVG-OM  . Stent to peoneal  05/15/13    Diamondback rot. atherectomy of high grade segmental popliteal stenosis then Chocolate balloonPTA; then angiosculpt PTA of the prox peroneal with stent-Xpedition   .  Pv angio  07/20/13    high-grade calcified popliteal disease with one-vessel runoff via a small anterior tibial artery that has proximal disease as well, no intervention  . Cardiac catheterization  11/2013    Widely patent LIMA-LAD & SVG-OM with mild progression of  proximal stenosis ~40-50% in SVG-D1; Widely patent native RCA with known severe native LCA disease  . Transthoracic echocardiogram  March 2012    Mild inferior-apical hypokinesis; EF 50-55%. Aortic sclerosis. PA pressure 30-40 mmHg.    Allergies  Allergen Reactions  . Fish Allergy Shortness Of Breath  . Iohexol Shortness Of Breath  . Ivp Dye [Iodinated Diagnostic Agents] Shortness Of Breath  . Shellfish Allergy Shortness Of Breath    All seafood  . Glucophage [Metformin Hcl] Other (See Comments)    Instructed by physician not to take anymore   . Latex     REACTION: wheezing    MEDICATIONS REVIEWED IN EPIC  History   Social History Narrative    She is a married mother of 4, grandmother of 5, great grandmother of 4.    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    She does not really get a lot of exercise,    She does not smoke or drink EtOH.   ROS: A comprehensive Review of Systems - Negative except Mild bruising on left wrist  PHYSICAL EXAM BP 120/62  Pulse 68  Ht 5\' 2"  (1.575 m)  Wt 171 lb 8 oz (77.792 kg)  BMI 31.36 kg/m2 General appearance: alert, cooperative, appears stated age, no distress and mildly obese Neck: no adenopathy, no carotid bruit and no JVD Lungs: clear to auscultation bilaterally, normal percussion bilaterally and non-labored Heart: regular rate and rhythm, S1, S2 normal, no murmur, click, rub or gallop; nondisplaced PMI Abdomen: soft, non-tender; bowel sounds normal; no masses,  no organomegaly; mild obesity Extremities: extremities normal, atraumatic, no cyanosis, or edema;There is some mild bruising on the left wrist. Pulses: 2+ and symmetric upper extremities.  Still has diminished pedal pulses, but stable.; Neurologic: Mental status: Alert, oriented, thought content appropriate Cranial nerves: normal (II-XII grossly intact)  WUJ:WJXBJYNWG today: No  Recent Labs precath: TC 145, TG 77, HDL 45, LDL 85  ASSESSMENT / PLAN: CAD  (coronary artery disease), hx of CABG in 1998, last nuc 03/2013 low risk study  Stable anatomy on recent cath. She is on aspirin plus culprit roper beta blocker, statin and doing well. She is supposed to be on a combination calcium blocker/ARB, that was not placed on her inpatient MAR.  She may need to add back on but otherwise, stable with no active symptoms currently. In lieu of an ACE inhibitor/ARB, I will start low-dose Imdur for possible microvascular ischemia.  HTN (hypertension) Well-controlled today. She i had previously been on 10/320 mg of amlodipine/valsartan (Exforge), and that was not seen in the hospital. We may very well need to add this back, at least at lower dose. For afterload reduction and had started on low-dose ACE inhibitor mostly because I did not see Exforge on her MAR, and was not sure if it has been stopped by another physician.  Monitor pressures for now, if, which is simply maintain current regimen. Otherwise would consider just adding back the ARB complement, unless her blood pressures begin to climb.  Dyslipidemia Monitored by PCP. On statin plus Zetia. LDL is only just above goal, otherwise well-controlled.  Chest pain with low risk for cardiac etiology- patent grafts on cath 11/26/13; ?  microvascular ischemia Negative symptoms post cath. Most likely musculoskeletal in nature.   No orders of the defined types were placed in this encounter.   Meds ordered this encounter  Medications  . isosorbide mononitrate (IMDUR) 30 MG 24 hr tablet    Sig: Take 1 tablet (30 mg total) by mouth daily.    Dispense:  90 tablet    Refill:  3    Followup: 6 months  DAVID W. Herbie Baltimore, M.D., M.S. [facility] [address] [place], [place]  [address]  [phone] Pager # [phone]

## 2013-12-03 NOTE — Patient Instructions (Signed)
I am glad to see that you doing ok.   I am glad that we finally got your leg arteries taken care of.    Sorry to see the bruise on your arm, but we had good news with the cath results.    I am going to start you on a long acting Nitroglycerin medication - IMDUR 30 mg (take 1/2 pill at night for hte 1st 6 nights, then increase to full pill -- it would help if you take your aspirin with your evening meal & take this medicatoin ~ 1/2 hr after).  In the hospital we converted your Blood Pressure Medication from Exforge (which is quite potent combination medication)), to a lower dose of a similar medcation - Lisinopril.  In the future, we may need to increase this, but for now, I am happy with your blood pressure.  [name], MD   Your physician wants you to follow-up in: 6 months You will receive a reminder letter in the mail two months in advance. If you don't receive a letter, please call our office to schedule the follow-up appointment.

## 2013-12-05 ENCOUNTER — Ambulatory Visit (INDEPENDENT_AMBULATORY_CARE_PROVIDER_SITE_OTHER): Payer: Medicare Other | Admitting: Cardiology

## 2013-12-05 ENCOUNTER — Encounter: Payer: Self-pay | Admitting: Cardiology

## 2013-12-05 ENCOUNTER — Telehealth: Payer: Self-pay | Admitting: Cardiovascular Disease

## 2013-12-05 VITALS — BP 122/68 | HR 80 | Ht 62.0 in | Wt 172.0 lb

## 2013-12-05 DIAGNOSIS — I2510 Atherosclerotic heart disease of native coronary artery without angina pectoris: Secondary | ICD-10-CM

## 2013-12-05 DIAGNOSIS — Z95820 Peripheral vascular angioplasty status with implants and grafts: Secondary | ICD-10-CM

## 2013-12-05 DIAGNOSIS — Z9889 Other specified postprocedural states: Secondary | ICD-10-CM

## 2013-12-05 DIAGNOSIS — R079 Chest pain, unspecified: Secondary | ICD-10-CM | POA: Diagnosis not present

## 2013-12-05 MED ORDER — IBUPROFEN 200 MG PO TABS: 200.0000 mg | ORAL_TABLET | Freq: Three times a day (TID) | ORAL | Status: DC | PRN

## 2013-12-05 MED ORDER — TRAMADOL HCL 50 MG PO TABS: 50.0000 mg | ORAL_TABLET | Freq: Four times a day (QID) | ORAL | Status: DC | PRN

## 2013-12-05 NOTE — Telephone Encounter (Signed)
Please call-left arm is hurting real bad and has a knot.the knot  was not there when she saw Dr Allyson Sabal.She doesn't  Know what to do.Shee need something for this pain.

## 2013-12-05 NOTE — Telephone Encounter (Signed)
Returned call and pt verified x 2.  Pt c/o knot below insertion site for cath in L arm.  Pt also c/o tenderness.  Stated it was just bruised when she saw Dr. Herbie Baltimore the other day and the knot just came up all of a sudden.  Pt stated she has taken too much tylenol and needs something for pain.  Pt informed she will likely need to come in for evaluation so the site can be checked.  Pt apprehensive, but stated she would come in if needed.  Dr. Royann Shivers notified and advised pt come in to see PA today.  Also advised ice and elevation of extremity.  Pt informed and scheduled for today at 1:40pm.  Pt agreed w/ plan.

## 2013-12-05 NOTE — Telephone Encounter (Signed)
PA visit should suffice.   Would be judicious with analgesic.

## 2013-12-05 NOTE — Patient Instructions (Addendum)
Warm compress to Lt wrist three time a day Your physician recommends that you schedule a follow-up appointment in: one week You may use a sling to help keep your arm elevated

## 2013-12-05 NOTE — Assessment & Plan Note (Signed)
Recent Lt radial cath

## 2013-12-05 NOTE — Progress Notes (Signed)
12/05/2013 Harrell Gave   [birth year]  [id number]  Primary Physicia Michiel Sites, MD Primary Cardiologist: Dr Allyson Sabal  HPI:  79 y/o with CAD and PVD, just discharged after cath. She was seen in the office by Dr Herbie Baltimore 12/03/13. She had some ecchymosis of her Lt forearm. Today she called saying she developed a "lump" Lt wrist. She is seen now as an add on. She has some "tingling" in he Lt hand but has full function and sensation.   Current Outpatient Prescriptions  Medication Sig Dispense Refill  . aspirin 81 MG EC tablet Take 1 tablet (81 mg total) by mouth daily.  30 tablet  0  . clopidogrel (PLAVIX) 75 MG tablet Take 75 mg by mouth daily.      . Coenzyme Q10 (CO Q 10 PO) Take 1 tablet by mouth at bedtime.       Marland Kitchen estradiol (ESTRACE) 1 MG tablet Take 1 mg by mouth daily.      Marland Kitchen ezetimibe (ZETIA) 10 MG tablet Take 10 mg by mouth daily.      Marland Kitchen glimepiride (AMARYL) 4 MG tablet Take 4 mg by mouth daily before breakfast.      . isosorbide mononitrate (IMDUR) 30 MG 24 hr tablet Take 1 tablet (30 mg total) by mouth daily.  90 tablet  3  . l-methylfolate-B6-B12 (METANX) 3-35-2 MG TABS Take 1 tablet by mouth daily.      Marland Kitchen lisinopril (PRINIVIL,ZESTRIL) 2.5 MG tablet Take 1 tablet (2.5 mg total) by mouth daily.  30 tablet  5  . LORazepam (ATIVAN) 0.5 MG tablet Take 0.5 mg by mouth every 8 (eight) hours as needed for anxiety.       . metoprolol succinate (TOPROL-XL) 100 MG 24 hr tablet Take 100 mg by mouth daily. Take with or immediately following a meal.      . Multiple Vitamins-Minerals (CENTRUM SILVER PO) Take 1 tablet by mouth daily.      . nitroGLYCERIN (NITROSTAT) 0.4 MG SL tablet Place 0.4 mg under the tongue every 5 (five) minutes as needed for chest pain.      . pantoprazole (PROTONIX) 40 MG tablet Take 1 tablet (40 mg total) by mouth daily.  30 tablet  1  . pregabalin (LYRICA) 75 MG capsule Take 75 mg by mouth every evening.      . rosuvastatin (CRESTOR) 20 MG tablet Take 20 mg by  mouth daily.      . saxagliptin HCl (ONGLYZA) 5 MG TABS tablet Take 5 mg by mouth daily.      Marland Kitchen sulfanilamide (AVC) 15 % vaginal cream Place 1 applicator vaginally daily.      Marland Kitchen ibuprofen (ADVIL) 200 MG tablet Take 1 tablet (200 mg total) by mouth every 8 (eight) hours as needed for mild pain.  30 tablet  0  . traMADol (ULTRAM) 50 MG tablet Take 1 tablet (50 mg total) by mouth every 6 (six) hours as needed for moderate pain.  30 tablet  0   No current facility-administered medications for this visit.    Allergies  Allergen Reactions  . Fish Allergy Shortness Of Breath  . Iohexol Shortness Of Breath  . Ivp Dye [Iodinated Diagnostic Agents] Shortness Of Breath  . Shellfish Allergy Shortness Of Breath    All seafood  . Glucophage [Metformin Hcl] Other (See Comments)    Instructed by physician not to take anymore   . Latex     REACTION: wheezing    History   Social History  .  Marital Status: Married    Spouse Name: N/A    Number of Children: N/A  . Years of Education: N/A   Occupational History  . Not on file.   Social History Main Topics  . Smoking status: Never Smoker   . Smokeless tobacco: Never Used  . Alcohol Use: No     Comment: rare glass of wine   . Drug Use: No  . Sexual Activity: Not on file   Other Topics Concern  . Not on file   Social History Narrative  . No narrative on file     Review of Systems: General: negative for chills, fever, night sweats or weight changes.  Cardiovascular: negative for chest pain, dyspnea on exertion, edema, orthopnea, palpitations, paroxysmal nocturnal dyspnea or shortness of breath Dermatological: negative for rash Respiratory: negative for cough or wheezing Urologic: negative for hematuria Abdominal: negative for nausea, vomiting, diarrhea, bright red blood per rectum, melena, or hematemesis Neurologic: negative for visual changes, syncope, or dizziness All other systems reviewed and are otherwise negative except as noted  above.    Blood pressure 122/68, pulse 80, height 5\' 2"  (1.575 m), weight 172 lb (78.019 kg).  General appearance: alert, cooperative and no distress Extremities: Lt wrist and forearm are echymotic. Ther is a small, pea sized bumpon her Lt wrist. It is not pulsitile. She has a good radial pulse distal to this site. She has no edema in her hand. She has good capillary refill.     ASSESSMENT AND PLAN:   Chest pain with low risk for cardiac etiology- patent grafts on cath 11/26/13; ? microvascular ischemia Recent Lt radial cath  CAD (coronary artery disease), hx of CABG in 1998, last nuc 03/2013 low risk study  .  PTA/ Stent Rt popliteal and Rt peroneal artery 05/16/13 .   PLAN  Warm compress TID, Advil 200 mg TID, Ultram for breakthrough pain. Stop Pletal (she is on ASA and Plavix). Return to clinic in one week. She can use a sling prn.   [name] KPA-C 12/05/2013 2:30 PM

## 2013-12-09 ENCOUNTER — Encounter: Payer: Self-pay | Admitting: Cardiology

## 2013-12-09 NOTE — Assessment & Plan Note (Signed)
Well-controlled today. She i had previously been on 10/320 mg of amlodipine/valsartan (Exforge), and that was not seen in the hospital. We may very well need to add this back, at least at lower dose. For afterload reduction and had started on low-dose ACE inhibitor mostly because I did not see Exforge on her MAR, and was not sure if it has been stopped by another physician.  Monitor pressures for now, if, which is simply maintain current regimen. Otherwise would consider just adding back the ARB complement, unless her blood pressures begin to climb.

## 2013-12-09 NOTE — Assessment & Plan Note (Signed)
Monitored by PCP. On statin plus Zetia. LDL is only just above goal, otherwise well-controlled.

## 2013-12-09 NOTE — Assessment & Plan Note (Signed)
Negative symptoms post cath. Most likely musculoskeletal in nature.

## 2013-12-09 NOTE — Assessment & Plan Note (Addendum)
Stable anatomy on recent cath. She is on aspirin plus culprit roper beta blocker, statin and doing well. She is supposed to be on a combination calcium blocker/ARB, that was not placed on her inpatient MAR.  She may need to add back on but otherwise, stable with no active symptoms currently. In lieu of an ACE inhibitor/ARB, I will start low-dose Imdur for possible microvascular ischemia.

## 2013-12-11 ENCOUNTER — Encounter: Payer: Self-pay | Admitting: Cardiology

## 2013-12-11 ENCOUNTER — Ambulatory Visit (INDEPENDENT_AMBULATORY_CARE_PROVIDER_SITE_OTHER): Payer: Medicare Other | Admitting: Cardiology

## 2013-12-11 VITALS — BP 150/80 | HR 72 | Ht 62.0 in | Wt 174.0 lb

## 2013-12-11 DIAGNOSIS — M79609 Pain in unspecified limb: Secondary | ICD-10-CM | POA: Diagnosis not present

## 2013-12-11 DIAGNOSIS — R079 Chest pain, unspecified: Secondary | ICD-10-CM

## 2013-12-11 NOTE — Assessment & Plan Note (Signed)
Improved

## 2013-12-11 NOTE — Assessment & Plan Note (Signed)
No further angina 

## 2013-12-11 NOTE — Progress Notes (Signed)
12/11/2013 [name]   09-30-1933  [id number]  Primary Physicia Michiel Sites, MD Primary Cardiologist: Dr Herbie Baltimore  HPI:  80 y/o with CAD and PVD, just discharged after cath. She was seen in the office by Dr Herbie Baltimore 12/03/13. She had some ecchymosis of her Lt forearm. On 12/24 she called saying she developed a "lump" Lt wrist. She was seen then as an add on. She had some "tingling" in he Lt hand but has full function and sensation. Her symptoms have improved with local heat and NSAID Rx.    Current Outpatient Prescriptions  Medication Sig Dispense Refill  . aspirin 81 MG EC tablet Take 1 tablet (81 mg total) by mouth daily.  30 tablet  0  . clopidogrel (PLAVIX) 75 MG tablet Take 75 mg by mouth daily.      . Coenzyme Q10 (CO Q 10 PO) Take 1 tablet by mouth at bedtime.       Marland Kitchen estradiol (ESTRACE) 1 MG tablet Take 1 mg by mouth daily.      Marland Kitchen ezetimibe (ZETIA) 10 MG tablet Take 10 mg by mouth daily.      Marland Kitchen glimepiride (AMARYL) 4 MG tablet Take 4 mg by mouth daily before breakfast.      . ibuprofen (ADVIL) 200 MG tablet Take 1 tablet (200 mg total) by mouth every 8 (eight) hours as needed for mild pain.  30 tablet  0  . isosorbide mononitrate (IMDUR) 30 MG 24 hr tablet Take 1 tablet (30 mg total) by mouth daily.  90 tablet  3  . l-methylfolate-B6-B12 (METANX) 3-35-2 MG TABS Take 1 tablet by mouth daily.      Marland Kitchen lisinopril (PRINIVIL,ZESTRIL) 2.5 MG tablet Take 1 tablet (2.5 mg total) by mouth daily.  30 tablet  5  . LORazepam (ATIVAN) 0.5 MG tablet Take 0.5 mg by mouth every 8 (eight) hours as needed for anxiety.       . metoprolol succinate (TOPROL-XL) 100 MG 24 hr tablet Take 100 mg by mouth daily. Take with or immediately following a meal.      . Multiple Vitamins-Minerals (CENTRUM SILVER PO) Take 1 tablet by mouth daily.      . nitroGLYCERIN (NITROSTAT) 0.4 MG SL tablet Place 0.4 mg under the tongue every 5 (five) minutes as needed for chest pain.      . pregabalin (LYRICA) 75 MG  capsule Take 75 mg by mouth every evening.      . rosuvastatin (CRESTOR) 20 MG tablet Take 20 mg by mouth daily.      . saxagliptin HCl (ONGLYZA) 5 MG TABS tablet Take 5 mg by mouth daily.      Marland Kitchen sulfanilamide (AVC) 15 % vaginal cream Place 1 applicator vaginally daily.      . traMADol (ULTRAM) 50 MG tablet Take 1 tablet (50 mg total) by mouth every 6 (six) hours as needed for moderate pain.  30 tablet  0  . pantoprazole (PROTONIX) 40 MG tablet Take 1 tablet (40 mg total) by mouth daily.  30 tablet  1   No current facility-administered medications for this visit.    Allergies  Allergen Reactions  . Fish Allergy Shortness Of Breath  . Iohexol Shortness Of Breath  . Ivp Dye [Iodinated Diagnostic Agents] Shortness Of Breath  . Shellfish Allergy Shortness Of Breath    All seafood  . Glucophage [Metformin Hcl] Other (See Comments)    Instructed by physician not to take anymore   . Latex  REACTION: wheezing    History   Social History  . Marital Status: Married    Spouse Name: N/A    Number of Children: N/A  . Years of Education: N/A   Occupational History  . Not on file.   Social History Main Topics  . Smoking status: Never Smoker   . Smokeless tobacco: Never Used  . Alcohol Use: No     Comment: rare glass of wine   . Drug Use: No  . Sexual Activity: Not on file   Other Topics Concern  . Not on file   Social History Narrative    She is a married mother of 4, grandmother of 5, great grandmother of 4.    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    She does not really get a lot of exercise,    She does not smoke or drink EtOH.     Review of Systems: General: negative for chills, fever, night sweats or weight changes.  Cardiovascular: negative for chest pain, dyspnea on exertion, edema, orthopnea, palpitations, paroxysmal nocturnal dyspnea or shortness of breath Dermatological: negative for rash Respiratory: negative for cough or wheezing Urologic:  negative for hematuria Abdominal: negative for nausea, vomiting, diarrhea, bright red blood per rectum, melena, or hematemesis Neurologic: negative for visual changes, syncope, or dizziness All other systems reviewed and are otherwise negative except as noted above.    Blood pressure 150/80, pulse 72, height 5\' 2"  (1.575 m), weight 174 lb (78.926 kg).  General appearance: alert, cooperative and no distress Lt wrist still ecchymotic. Small pea sized bump in Lt wrist. No evidence of infection.    ASSESSMENT AND PLAN:   Limb pain- echymosis, pain Lt radial artery after cath Improved.  Chest pain with low risk for cardiac etiology- patent grafts on cath 11/26/13; ? microvascular ischemia No further angina   PLAN  Same Rx, follow up with Dr Herbie Baltimore 6 months.  [facility] KPA-C 12/11/2013 4:23 PM

## 2013-12-11 NOTE — Patient Instructions (Signed)
Your physician recommends that you schedule a follow-up appointment in 3 months with Dr. Harding   

## 2014-01-02 ENCOUNTER — Observation Stay (HOSPITAL_COMMUNITY)
Admission: EM | Admit: 2014-01-02 | Discharge: 2014-01-05 | Disposition: A | Discharge status: Home / Self Care | Payer: Medicare Other | Attending: Internal Medicine | Admitting: Internal Medicine

## 2014-01-02 ENCOUNTER — Emergency Department (HOSPITAL_COMMUNITY): Payer: Medicare Other

## 2014-01-02 ENCOUNTER — Observation Stay (HOSPITAL_COMMUNITY): Payer: Medicare Other

## 2014-01-02 ENCOUNTER — Encounter (HOSPITAL_COMMUNITY): Payer: Self-pay | Admitting: Emergency Medicine

## 2014-01-02 DIAGNOSIS — I200 Unstable angina: Secondary | ICD-10-CM

## 2014-01-02 DIAGNOSIS — E785 Hyperlipidemia, unspecified: Secondary | ICD-10-CM

## 2014-01-02 DIAGNOSIS — H5500 Unspecified nystagmus: Secondary | ICD-10-CM | POA: Diagnosis not present

## 2014-01-02 DIAGNOSIS — R930 Abnormal findings on diagnostic imaging of skull and head, not elsewhere classified: Secondary | ICD-10-CM | POA: Diagnosis not present

## 2014-01-02 DIAGNOSIS — K2270 Barrett's esophagus without dysplasia: Secondary | ICD-10-CM

## 2014-01-02 DIAGNOSIS — Z8711 Personal history of peptic ulcer disease: Secondary | ICD-10-CM | POA: Diagnosis not present

## 2014-01-02 DIAGNOSIS — D638 Anemia in other chronic diseases classified elsewhere: Secondary | ICD-10-CM

## 2014-01-02 DIAGNOSIS — I119 Hypertensive heart disease without heart failure: Secondary | ICD-10-CM | POA: Diagnosis not present

## 2014-01-02 DIAGNOSIS — H814 Vertigo of central origin: Principal | ICD-10-CM

## 2014-01-02 DIAGNOSIS — Z7902 Long term (current) use of antithrombotics/antiplatelets: Secondary | ICD-10-CM | POA: Diagnosis not present

## 2014-01-02 DIAGNOSIS — K219 Gastro-esophageal reflux disease without esophagitis: Secondary | ICD-10-CM | POA: Diagnosis not present

## 2014-01-02 DIAGNOSIS — Z95820 Peripheral vascular angioplasty status with implants and grafts: Secondary | ICD-10-CM

## 2014-01-02 DIAGNOSIS — I259 Chronic ischemic heart disease, unspecified: Secondary | ICD-10-CM | POA: Insufficient documentation

## 2014-01-02 DIAGNOSIS — R42 Dizziness and giddiness: Secondary | ICD-10-CM | POA: Diagnosis not present

## 2014-01-02 DIAGNOSIS — I10 Essential (primary) hypertension: Secondary | ICD-10-CM

## 2014-01-02 DIAGNOSIS — I739 Peripheral vascular disease, unspecified: Secondary | ICD-10-CM

## 2014-01-02 DIAGNOSIS — M1990 Unspecified osteoarthritis, unspecified site: Secondary | ICD-10-CM | POA: Diagnosis not present

## 2014-01-02 DIAGNOSIS — Z951 Presence of aortocoronary bypass graft: Secondary | ICD-10-CM | POA: Insufficient documentation

## 2014-01-02 DIAGNOSIS — R079 Chest pain, unspecified: Secondary | ICD-10-CM

## 2014-01-02 DIAGNOSIS — D649 Anemia, unspecified: Secondary | ICD-10-CM | POA: Insufficient documentation

## 2014-01-02 DIAGNOSIS — I2510 Atherosclerotic heart disease of native coronary artery without angina pectoris: Secondary | ICD-10-CM

## 2014-01-02 DIAGNOSIS — I110 Hypertensive heart disease with heart failure: Secondary | ICD-10-CM | POA: Diagnosis present

## 2014-01-02 DIAGNOSIS — I25119 Atherosclerotic heart disease of native coronary artery with unspecified angina pectoris: Secondary | ICD-10-CM | POA: Diagnosis present

## 2014-01-02 DIAGNOSIS — E7800 Pure hypercholesterolemia, unspecified: Secondary | ICD-10-CM | POA: Insufficient documentation

## 2014-01-02 DIAGNOSIS — I6350 Cerebral infarction due to unspecified occlusion or stenosis of unspecified cerebral artery: Secondary | ICD-10-CM

## 2014-01-02 DIAGNOSIS — Z860100 Personal history of colon polyps, unspecified: Secondary | ICD-10-CM

## 2014-01-02 DIAGNOSIS — R51 Headache: Secondary | ICD-10-CM | POA: Diagnosis not present

## 2014-01-02 DIAGNOSIS — M79609 Pain in unspecified limb: Secondary | ICD-10-CM

## 2014-01-02 DIAGNOSIS — K5730 Diverticulosis of large intestine without perforation or abscess without bleeding: Secondary | ICD-10-CM

## 2014-01-02 DIAGNOSIS — R1031 Right lower quadrant pain: Secondary | ICD-10-CM

## 2014-01-02 DIAGNOSIS — R0789 Other chest pain: Secondary | ICD-10-CM

## 2014-01-02 DIAGNOSIS — Z7982 Long term (current) use of aspirin: Secondary | ICD-10-CM | POA: Insufficient documentation

## 2014-01-02 DIAGNOSIS — Z91013 Allergy to seafood: Secondary | ICD-10-CM

## 2014-01-02 DIAGNOSIS — I498 Other specified cardiac arrhythmias: Secondary | ICD-10-CM | POA: Diagnosis not present

## 2014-01-02 DIAGNOSIS — E119 Type 2 diabetes mellitus without complications: Secondary | ICD-10-CM | POA: Diagnosis not present

## 2014-01-02 DIAGNOSIS — Z9889 Other specified postprocedural states: Secondary | ICD-10-CM

## 2014-01-02 DIAGNOSIS — I5030 Unspecified diastolic (congestive) heart failure: Secondary | ICD-10-CM

## 2014-01-02 DIAGNOSIS — R001 Bradycardia, unspecified: Secondary | ICD-10-CM

## 2014-01-02 DIAGNOSIS — H6690 Otitis media, unspecified, unspecified ear: Secondary | ICD-10-CM

## 2014-01-02 HISTORY — DX: Dizziness and giddiness: R42

## 2014-01-02 LAB — CBC WITH DIFFERENTIAL/PLATELET
BASOS PCT: 0 % (ref 0–1)
Basophils Absolute: 0 10*3/uL (ref 0.0–0.1)
EOS PCT: 2 % (ref 0–5)
Eosinophils Absolute: 0.1 10*3/uL (ref 0.0–0.7)
HEMATOCRIT: 36.7 % (ref 36.0–46.0)
HEMOGLOBIN: 11.7 g/dL — AB (ref 12.0–15.0)
LYMPHS PCT: 23 % (ref 12–46)
Lymphs Abs: 1.6 10*3/uL (ref 0.7–4.0)
MCH: 27.5 pg (ref 26.0–34.0)
MCHC: 31.9 g/dL (ref 30.0–36.0)
MCV: 86.2 fL (ref 78.0–100.0)
MONO ABS: 0.5 10*3/uL (ref 0.1–1.0)
Monocytes Relative: 8 % (ref 3–12)
Neutro Abs: 4.9 10*3/uL (ref 1.7–7.7)
Neutrophils Relative %: 68 % (ref 43–77)
Platelets: 290 10*3/uL (ref 150–400)
RBC: 4.26 MIL/uL (ref 3.87–5.11)
RDW: 14.3 % (ref 11.5–15.5)
WBC: 7.2 10*3/uL (ref 4.0–10.5)

## 2014-01-02 LAB — BASIC METABOLIC PANEL
BUN: 16 mg/dL (ref 6–23)
CALCIUM: 9 mg/dL (ref 8.4–10.5)
CO2: 23 meq/L (ref 19–32)
CREATININE: 0.88 mg/dL (ref 0.50–1.10)
Chloride: 100 mEq/L (ref 96–112)
GFR calc Af Amer: 71 mL/min — ABNORMAL LOW (ref >90)
GFR calc non Af Amer: 61 mL/min — ABNORMAL LOW (ref >90)
Glucose, Bld: 157 mg/dL — ABNORMAL HIGH (ref 70–99)
Potassium: 4.6 mEq/L (ref 3.7–5.3)
Sodium: 140 mEq/L (ref 137–147)

## 2014-01-02 LAB — URINALYSIS, ROUTINE W REFLEX MICROSCOPIC
Bilirubin Urine: NEGATIVE
Glucose, UA: NEGATIVE mg/dL
Hgb urine dipstick: NEGATIVE
Ketones, ur: NEGATIVE mg/dL
LEUKOCYTES UA: NEGATIVE
Nitrite: NEGATIVE
Protein, ur: NEGATIVE mg/dL
Specific Gravity, Urine: 1.02 (ref 1.005–1.030)
UROBILINOGEN UA: 0.2 mg/dL (ref 0.0–1.0)
pH: 6.5 (ref 5.0–8.0)

## 2014-01-02 LAB — GLUCOSE, CAPILLARY: Glucose-Capillary: 87 mg/dL (ref 70–99)

## 2014-01-02 LAB — TROPONIN I

## 2014-01-02 MED ORDER — ONDANSETRON HCL 4 MG/2ML IJ SOLN
4.0000 mg | Freq: Four times a day (QID) | INTRAMUSCULAR | Status: DC | PRN
Start: 2014-01-02 — End: 2014-01-05
  Administered 2014-01-03: 4 mg via INTRAVENOUS
  Filled 2014-01-02: qty 2

## 2014-01-02 MED ORDER — LISINOPRIL 2.5 MG PO TABS
2.5000 mg | ORAL_TABLET | Freq: Every day | ORAL | Status: DC
Start: 2014-01-02 — End: 2014-01-04
  Administered 2014-01-02 – 2014-01-04 (×3): 2.5 mg via ORAL
  Filled 2014-01-02 (×3): qty 1

## 2014-01-02 MED ORDER — SODIUM CHLORIDE 0.9 % IV SOLN
INTRAVENOUS | Status: DC
  Administered 2014-01-02 – 2014-01-03 (×2): via INTRAVENOUS

## 2014-01-02 MED ORDER — ONDANSETRON HCL 4 MG PO TABS: 4.0000 mg | ORAL_TABLET | Freq: Four times a day (QID) | ORAL | Status: DC | PRN

## 2014-01-02 MED ORDER — SODIUM CHLORIDE 0.9 % IJ SOLN
3.0000 mL | Freq: Two times a day (BID) | INTRAMUSCULAR | Status: DC
  Administered 2014-01-02 – 2014-01-04 (×3): 3 mL via INTRAVENOUS

## 2014-01-02 MED ORDER — ACETAMINOPHEN 650 MG RE SUPP
650.0000 mg | Freq: Four times a day (QID) | RECTAL | Status: DC | PRN
Start: 2014-01-02 — End: 2014-01-05
  Filled 2014-01-02: qty 1

## 2014-01-02 MED ORDER — INSULIN ASPART 100 UNIT/ML ~~LOC~~ SOLN
0.0000 [IU] | Freq: Three times a day (TID) | SUBCUTANEOUS | Status: DC
  Administered 2014-01-03 (×2): 1 [IU] via SUBCUTANEOUS
  Administered 2014-01-04: 2 [IU] via SUBCUTANEOUS

## 2014-01-02 MED ORDER — ENOXAPARIN SODIUM 40 MG/0.4ML ~~LOC~~ SOLN
40.0000 mg | SUBCUTANEOUS | Status: DC
  Administered 2014-01-02 – 2014-01-04 (×3): 40 mg via SUBCUTANEOUS
  Filled 2014-01-02 (×4): qty 0.4

## 2014-01-02 MED ORDER — LORAZEPAM 0.5 MG PO TABS
0.5000 mg | ORAL_TABLET | Freq: Four times a day (QID) | ORAL | Status: DC | PRN
  Administered 2014-01-03 – 2014-01-05 (×2): 0.5 mg via ORAL
  Filled 2014-01-02 (×2): qty 1

## 2014-01-02 MED ORDER — HYDRALAZINE HCL 20 MG/ML IJ SOLN
10.0000 mg | Freq: Four times a day (QID) | INTRAMUSCULAR | Status: DC | PRN
  Filled 2014-01-02: qty 1

## 2014-01-02 MED ORDER — ASPIRIN 81 MG PO CHEW
324.0000 mg | CHEWABLE_TABLET | Freq: Once | ORAL | Status: AC
  Administered 2014-01-02: 324 mg via ORAL
  Filled 2014-01-02: qty 4

## 2014-01-02 MED ORDER — ISOSORBIDE MONONITRATE ER 30 MG PO TB24
30.0000 mg | ORAL_TABLET | Freq: Every day | ORAL | Status: DC
  Administered 2014-01-02 – 2014-01-03 (×2): 30 mg via ORAL
  Filled 2014-01-02 (×2): qty 1

## 2014-01-02 MED ORDER — EZETIMIBE 10 MG PO TABS
10.0000 mg | ORAL_TABLET | Freq: Every day | ORAL | Status: DC
  Administered 2014-01-02 – 2014-01-05 (×4): 10 mg via ORAL
  Filled 2014-01-02 (×4): qty 1

## 2014-01-02 MED ORDER — PANTOPRAZOLE SODIUM 40 MG PO TBEC
40.0000 mg | DELAYED_RELEASE_TABLET | Freq: Every day | ORAL | Status: DC
  Administered 2014-01-02 – 2014-01-05 (×4): 40 mg via ORAL
  Filled 2014-01-02 (×3): qty 1

## 2014-01-02 MED ORDER — DIAZEPAM 5 MG/ML IJ SOLN: 2.5000 mg | Freq: Once | INTRAMUSCULAR | Status: DC

## 2014-01-02 MED ORDER — ACETAMINOPHEN 325 MG PO TABS
650.0000 mg | ORAL_TABLET | Freq: Four times a day (QID) | ORAL | Status: DC | PRN
  Administered 2014-01-02 – 2014-01-05 (×5): 650 mg via ORAL
  Filled 2014-01-02 (×5): qty 2

## 2014-01-02 MED ORDER — ASPIRIN EC 81 MG PO TBEC
81.0000 mg | DELAYED_RELEASE_TABLET | Freq: Every day | ORAL | Status: DC
  Administered 2014-01-02 – 2014-01-05 (×4): 81 mg via ORAL
  Filled 2014-01-02 (×4): qty 1

## 2014-01-02 MED ORDER — CLOPIDOGREL BISULFATE 75 MG PO TABS
75.0000 mg | ORAL_TABLET | Freq: Every day | ORAL | Status: DC
  Administered 2014-01-02 – 2014-01-05 (×4): 75 mg via ORAL
  Filled 2014-01-02 (×4): qty 1

## 2014-01-02 MED ORDER — L-METHYLFOLATE-B6-B12 3-35-2 MG PO TABS
1.0000 | ORAL_TABLET | Freq: Every day | ORAL | Status: DC
  Administered 2014-01-02 – 2014-01-05 (×4): 1 via ORAL
  Filled 2014-01-02 (×4): qty 1

## 2014-01-02 MED ORDER — ESTRADIOL 1 MG PO TABS
1.0000 mg | ORAL_TABLET | Freq: Every day | ORAL | Status: DC
  Administered 2014-01-03 – 2014-01-05 (×3): 1 mg via ORAL
  Filled 2014-01-02 (×3): qty 1

## 2014-01-02 MED ORDER — ATORVASTATIN CALCIUM 10 MG PO TABS
10.0000 mg | ORAL_TABLET | Freq: Every day | ORAL | Status: DC
  Administered 2014-01-02 – 2014-01-04 (×3): 10 mg via ORAL
  Filled 2014-01-02 (×5): qty 1

## 2014-01-02 MED ORDER — MECLIZINE HCL 12.5 MG PO TABS
12.5000 mg | ORAL_TABLET | Freq: Two times a day (BID) | ORAL | Status: DC | PRN
  Administered 2014-01-02 – 2014-01-03 (×2): 12.5 mg via ORAL
  Filled 2014-01-02 (×3): qty 1

## 2014-01-02 MED ORDER — METOPROLOL SUCCINATE ER 100 MG PO TB24
100.0000 mg | ORAL_TABLET | Freq: Every day | ORAL | Status: DC
  Administered 2014-01-02 – 2014-01-03 (×2): 100 mg via ORAL
  Filled 2014-01-02 (×2): qty 1

## 2014-01-02 NOTE — Consult Note (Signed)
Referring Physician: Romana Juniper    Chief Complaint: Vertigo/HA  HPI:                                                                                                                                         [name] is an 80 y.o. female who states yesterday she had a brief peroid in which the room felt as though it was spinning to the left but this lasted for only minutes and subsided. Today she woke up at 3 AM with HA located over the left aspect of her head and sever feeling of the room moving to the left.  She states the vertigo would come and go but lasted longer than previous days vertigo.  In addition she noted decreased sensation on the left aspect of her face and some arm and leg. She was brought to ED.  At time of consultation she would be fine at times but suddenly she would note sudden onset of room spinning to the left.  This was most notable when her head was turned to the left and she was laid flat. At that time she did show torsinal nystagmus. In addition to her vertigo, her BP was between 112-235/98-115 during consultation. Patient denies N/V, blurred vision, diplopia, lateralized weakness.   Date last known well: Date: 01/02/2014 Time last known well: Time: 03:00 tPA Given: No: out of window  Past Medical History  Diagnosis Date  . CAD (coronary artery disease) 1998    Hx CABG 1998, neg nuc 4/14; echo 02/22/11-EF 50-55%, mild pul htn, inf wall hypokinesis; nuc 04/03/13-no ischemia; Cath 11/2013 - Non-obstructive CAD  . Hypertension   . PAD (peripheral artery disease)     severe calcified pop and tibial peroneal trunk disease bil  . Hyperlipemia   . Chest pain   . DM (diabetes mellitus), type 2 with peripheral vascular complications     On oral medications  . GERD (gastroesophageal reflux disease)   . Claudication, lifestyle limiting 05/16/2013  . S/P angioplasty with stent, 05/15/13, Diamondback rot. atherectomy of high grade segmental popliteal stenosis then Chocolate balloonPTA;  then angiosculpt PTA of the prox peroneal with stent-Xpedition  05/16/2013  . Bradycardia, mild briefly to the 40s, asymptomatic 05/16/2013  . Peptic ulcer disease   . Chronic anemia     Past Surgical History  Procedure Laterality Date  . Cardiac catheterization  07/17/01    2 v CAD with LM, circ, LAD, obtuse diag, mod stenosis of diag vein graft , mild stenosis of marg vein graft, nl EF, medical treatment  . Coronary angioplasty  1992    PCI to LAD  . Coronary artery bypass graft  1998    LIMA-LAD, SVG-diagonal (none roughly 50% stenosis), SVG-OM  . Tubal ligation    . Back surgery    . Stent to peoneal  05/15/13    Diamondback rot. atherectomy of high grade segmental  popliteal stenosis then Chocolate balloonPTA; then angiosculpt PTA of the prox peroneal with stent-Xpedition   . Pv angio  07/20/13    high-grade calcified popliteal disease with one-vessel runoff via a small anterior tibial artery that has proximal disease as well, no intervention  . Cardiac catheterization  11/2013    Widely patent LIMA-LAD & SVG-OM with mild progression of proximal stenosis ~40-50% in SVG-D1; Widely patent native RCA with known severe native LCA disease  . Transthoracic echocardiogram  March 2012    Mild inferior-apical hypokinesis; EF 50-55%. Aortic sclerosis. PA pressure 30-40 mmHg.    Family History  Problem Relation Age of Onset  . Hypertension Mother   . Hyperlipidemia Mother   . Hyperlipidemia Father   . Hypertension Father    Social History:  reports that she has never smoked. She has never used smokeless tobacco. She reports that she does not drink alcohol or use illicit drugs.  Allergies:  Allergies  Allergen Reactions  . Fish Allergy Hives and Shortness Of Breath  . Iohexol Shortness Of Breath  . Ivp Dye [Iodinated Diagnostic Agents] Shortness Of Breath  . Latex Hives, Shortness Of Breath and Itching    REACTION: wheezing  . Shellfish Allergy Hives and Shortness Of Breath    All seafood   . Glucophage [Metformin Hcl] Other (See Comments)    Instructed by physician not to take anymore     Medications:                                                                                                                           Current Facility-Administered Medications  Medication Dose Route Frequency Provider Last Rate Last Dose  . 0.9 %  sodium chloride infusion   Intravenous Continuous Orpah Greek, MD 75 mL/hr at 01/02/14 1002    . diazepam (VALIUM) injection 2.5 mg  2.5 mg Intravenous Once Orpah Greek, MD       Current Outpatient Prescriptions  Medication Sig Dispense Refill  . acetaminophen (TYLENOL) 500 MG tablet Take 500-1,000 mg by mouth 2 (two) times daily as needed for headache.      Marland Kitchen aspirin 81 MG EC tablet Take 1 tablet (81 mg total) by mouth daily.  30 tablet  0  . clopidogrel (PLAVIX) 75 MG tablet Take 75 mg by mouth daily.      . Coenzyme Q10 (CO Q 10 PO) Take 1 tablet by mouth daily.       Marland Kitchen estradiol (ESTRACE) 1 MG tablet Take 1 mg by mouth daily.      Marland Kitchen ezetimibe (ZETIA) 10 MG tablet Take 10 mg by mouth daily.      Marland Kitchen glimepiride (AMARYL) 4 MG tablet Take 4 mg by mouth daily before breakfast.      . isosorbide mononitrate (IMDUR) 30 MG 24 hr tablet Take 1 tablet (30 mg total) by mouth daily.  90 tablet  3  . l-methylfolate-B6-B12 (METANX) 3-35-2 MG TABS Take 1 tablet by  mouth daily.      Marland Kitchen lisinopril (PRINIVIL,ZESTRIL) 2.5 MG tablet Take 1 tablet (2.5 mg total) by mouth daily.  30 tablet  5  . LORazepam (ATIVAN) 0.5 MG tablet Take 0.5 mg by mouth every 6 (six) hours as needed for anxiety.       . metoprolol succinate (TOPROL-XL) 100 MG 24 hr tablet Take 100 mg by mouth daily. Take with or immediately following a meal.      . Multiple Vitamins-Minerals (CENTRUM SILVER PO) Take 1 tablet by mouth daily.      . nitroGLYCERIN (NITROSTAT) 0.4 MG SL tablet Place 0.4 mg under the tongue every 5 (five) minutes as needed for chest pain.      .  rosuvastatin (CRESTOR) 20 MG tablet Take 20 mg by mouth every evening.       . saxagliptin HCl (ONGLYZA) 5 MG TABS tablet Take 5 mg by mouth daily.      . traMADol (ULTRAM) 50 MG tablet Take 1 tablet (50 mg total) by mouth every 6 (six) hours as needed for moderate pain.  30 tablet  0  . pantoprazole (PROTONIX) 40 MG tablet Take 1 tablet (40 mg total) by mouth daily.  30 tablet  1     ROS:                                                                                                                                       History obtained from the patient  General ROS: negative for - chills, fatigue, fever, night sweats, weight gain or weight loss Psychological ROS: negative for - behavioral disorder, hallucinations, memory difficulties, mood swings or suicidal ideation Ophthalmic ROS: negative for - blurry vision, double vision, eye pain or loss of vision ENT ROS: negative for - epistaxis, nasal discharge, oral lesions, sore throat, tinnitus or vertigo Allergy and Immunology ROS: negative for - hives or itchy/watery eyes Hematological and Lymphatic ROS: negative for - bleeding problems, bruising or swollen lymph nodes Endocrine ROS: negative for - galactorrhea, hair pattern changes, polydipsia/polyuria or temperature intolerance Respiratory ROS: negative for - cough, hemoptysis, shortness of breath or wheezing Cardiovascular ROS: negative for - chest pain, dyspnea on exertion, edema or irregular heartbeat Gastrointestinal ROS: negative for - abdominal pain, diarrhea, hematemesis, nausea/vomiting or stool incontinence Genito-Urinary ROS: negative for - dysuria, hematuria, incontinence or urinary frequency/urgency Musculoskeletal ROS: negative for - joint swelling or muscular weakness Neurological ROS: as noted in HPI Dermatological ROS: negative for rash and skin lesion changes  Neurologic Examination:  Blood pressure 227/101, pulse 67, resp. rate 19, SpO2 100.00%.   Mental Status: Alert, oriented, thought content appropriate.  Speech fluent without evidence of aphasia.  Able to follow 3 step commands without difficulty. Cranial Nerves: II: Discs flat bilaterally; Visual fields grossly normal, pupils equal, round, reactive to light and accommodation III,IV, VI: ptosis not present, extra-ocular motions intact bilaterally, torsinal nystagmus with dix hallpike maneuver--which was fatigable, Eply maneuver was performed with some relief of symptoms (decreased duration) but patient continued to have vertigo when moved.  V,VII: smile symmetric, facial light touch sensation decreased on the left VIII: hearing normal bilaterally IX,X: gag reflex present XI: bilateral shoulder shrug XII: midline tongue extension without atrophy or fasciculations  Motor: Right : Upper extremity   5/5    Left:     Upper extremity   5/5  Lower extremity   5/5     Lower extremity   5/5 Tone and bulk:normal tone throughout; no atrophy noted Sensory: Pinprick and light touch intact throughout, bilaterally Deep Tendon Reflexes:  Right: Upper Extremity   Left: Upper extremity   biceps (C-5 to C-6) 2/4   biceps (C-5 to C-6) 2/4 tricep (C7) 2/4    triceps (C7) 2/4 Brachioradialis (C6) 2/4  Brachioradialis (C6) 2/4  Lower Extremity Lower Extremity  quadriceps (L-2 to L-4) 2/4   quadriceps (L-2 to L-4) 2/4 Achilles (S1) 1/4   Achilles (S1) 1/4  Plantars: Right: downgoing   Left: downgoing Cerebellar: normal finger-to-nose,  normal heel-to-shin test Gait: wide based but showing no ataxia.  CV: pulses palpable throughout    Lab Results: Basic Metabolic Panel:  Recent Labs Lab 01/02/14 0926  NA 140  K 4.6  CL 100  CO2 23  GLUCOSE 157*  BUN 16  CREATININE 0.88  CALCIUM 9.0    Liver Function Tests: No results found for this basename: AST, ALT, ALKPHOS, BILITOT, PROT, ALBUMIN,  in the last 168  hours No results found for this basename: LIPASE, AMYLASE,  in the last 168 hours No results found for this basename: AMMONIA,  in the last 168 hours  CBC:  Recent Labs Lab 01/02/14 0926  WBC 7.2  NEUTROABS 4.9  HGB 11.7*  HCT 36.7  MCV 86.2  PLT 290    Cardiac Enzymes:  Recent Labs Lab 01/02/14 0926  TROPONINI <0.30    Lipid Panel: No results found for this basename: CHOL, TRIG, HDL, CHOLHDL, VLDL, LDLCALC,  in the last 168 hours  CBG: No results found for this basename: GLUCAP,  in the last 168 hours  Microbiology: No results found for this or any previous visit.  Coagulation Studies: No results found for this basename: LABPROT, INR,  in the last 72 hours  Imaging: Ct Head Wo Contrast  01/02/2014   CLINICAL DATA:  Intermittent dizziness.  Headache.  EXAM: CT HEAD WITHOUT CONTRAST  TECHNIQUE: Contiguous axial images were obtained from the base of the skull through the vertex without intravenous contrast.  COMPARISON:  Brain MRI 09/25/2006.  FINDINGS: Patchy and confluent areas of decreased attenuation are noted throughout the deep and periventricular white matter of the cerebral hemispheres bilaterally, compatible with chronic microvascular ischemic disease. No acute intracranial abnormalities. Specifically, no evidence of acute intracranial hemorrhage, no definite findings of acute/subacute cerebral ischemia, no mass, mass effect, hydrocephalus or abnormal intra or extra-axial fluid collections. Visualized paranasal sinuses and mastoids are well pneumatized. No acute displaced skull fractures are identified.  IMPRESSION: 1. No acute intracranial abnormalities. 2. Mild chronic microvascular ischemic changes  in the cerebral white matter.   Electronically Signed   By: Vinnie Langton M.D.   On: 01/02/2014 10:33       Assessment and plan discussed with with attending physician and they are in agreement.    Etta Quill PA-C [facility] [phone]  01/02/2014, 2:02 PM   Assessment: 80 y.o. female with new onset left facial decreased sensation and vertigo in the setting of significantly elevated BP.  On exam patient demonstrated torsinal nystagmus with Dix Hallpike maneuver and fatigability.  Likely peripheral BPPV, however given elevated BP and facial numbness cannot fully exclude possibility of CVA.   Stroke Risk Factors - diabetes mellitus, hyperlipidemia, hypertension and CAD  1) MRI brain, If positive:  1. HgbA1c, fasting lipid panel 2. MRA  of the brain without contrast 3. Frequent neuro checks 4. Echocardiogram 5. Carotid dopplers 6. Prophylactic therapy-Antiplatelet med: Aspirin - dose 325mg  7. Risk factor modification 8. Telemetry monitoring 9. PT consult, OT consult, Speech consult  If MRI is negative: 1) PT for vestibular rehab.  2) Could use meclizine 25mg  daily prn   Roland Rack, MD [facility] [phone]  If 7pm- 7am, please page neurology on call at [phone].

## 2014-01-02 NOTE — ED Notes (Signed)
Pt c/o dizziness that comes and goes since Saturday. Pt stated that dizziness has been continuous since last night. Also c/o HTN and headache. Pt stated she felt nauseated this morning and had a little bit of diarrhea.

## 2014-01-02 NOTE — ED Provider Notes (Addendum)
CSN: [id number]     Arrival date & time 01/02/14  [id number] History   First MD Initiated Contact with Patient 01/02/14 [phone]     Chief Complaint  Patient presents with  . Dizziness  . Headache  . Hypertension   (Consider location/radiation/quality/duration/timing/severity/associated sxs/prior Treatment) HPI Comments: Patient presents to the ER for evaluation of elevated blood pressure and vision changes. Patient reports that approximately 2 weeks ago her doctor stopped her Exforge because it was "too strong for her". She was placed on lisinopril. Patient reports the last 2 days she has been experiencing elevated blood pressure. She has been having intermittent left-sided headache. She also notes visual disturbance. She reports that she feels intermittently like her visual field is moving from side to side. Patient reports that her headache has currently resolved, but has been intermittent. She has not been experiencing any chest pain or shortness of breath. She reports no history of heart palpitations. Patient denies any weakness, numbness, tingling in her extremities.   Past Medical History  Diagnosis Date  . CAD (coronary artery disease) 1998    Hx CABG 1998, neg nuc 4/14; echo 02/22/11-EF 50-55%, mild pul htn, inf wall hypokinesis; nuc 04/03/13-no ischemia; Cath 11/2013 - Non-obstructive CAD  . Hypertension   . PAD (peripheral artery disease)     severe calcified pop and tibial peroneal trunk disease bil  . Hyperlipemia   . Chest pain   . DM (diabetes mellitus), type 2 with peripheral vascular complications     On oral medications  . GERD (gastroesophageal reflux disease)   . Claudication, lifestyle limiting 05/16/2013  . S/P angioplasty with stent, 05/15/13, Diamondback rot. atherectomy of high grade segmental popliteal stenosis then Chocolate balloonPTA; then angiosculpt PTA of the prox peroneal with stent-Xpedition  05/16/2013  . Bradycardia, mild briefly to the 40s, asymptomatic 05/16/2013  .  Peptic ulcer disease   . Chronic anemia    Past Surgical History  Procedure Laterality Date  . Cardiac catheterization  07/17/01    2 v CAD with LM, circ, LAD, obtuse diag, mod stenosis of diag vein graft , mild stenosis of marg vein graft, nl EF, medical treatment  . Coronary angioplasty  1992    PCI to LAD  . Coronary artery bypass graft  1998    LIMA-LAD, SVG-diagonal (none roughly 50% stenosis), SVG-OM  . Tubal ligation    . Back surgery    . Stent to peoneal  05/15/13    Diamondback rot. atherectomy of high grade segmental popliteal stenosis then Chocolate balloonPTA; then angiosculpt PTA of the prox peroneal with stent-Xpedition   . Pv angio  07/20/13    high-grade calcified popliteal disease with one-vessel runoff via a small anterior tibial artery that has proximal disease as well, no intervention  . Cardiac catheterization  11/2013    Widely patent LIMA-LAD & SVG-OM with mild progression of proximal stenosis ~40-50% in SVG-D1; Widely patent native RCA with known severe native LCA disease  . Transthoracic echocardiogram  March 2012    Mild inferior-apical hypokinesis; EF 50-55%. Aortic sclerosis. PA pressure 30-40 mmHg.   No family history on file. History  Substance Use Topics  . Smoking status: Never Smoker   . Smokeless tobacco: Never Used  . Alcohol Use: No     Comment: rare glass of wine    OB History   Grav Para Term Preterm Abortions TAB SAB Ect Mult Living  Review of Systems  Eyes: Positive for visual disturbance.  Cardiovascular: Negative.   Neurological: Positive for dizziness and headaches.  All other systems reviewed and are negative.    Allergies  Fish allergy; Iohexol; Ivp dye; Latex; Shellfish allergy; and Glucophage  Home Medications   Current Outpatient Rx  Name  Route  Sig  Dispense  Refill  . acetaminophen (TYLENOL) 500 MG tablet   Oral   Take 500-1,000 mg by mouth 2 (two) times daily as needed for headache.         Marland Kitchen aspirin  81 MG EC tablet   Oral   Take 1 tablet (81 mg total) by mouth daily.   30 tablet   0   . clopidogrel (PLAVIX) 75 MG tablet   Oral   Take 75 mg by mouth daily.         . Coenzyme Q10 (CO Q 10 PO)   Oral   Take 1 tablet by mouth daily.          Marland Kitchen estradiol (ESTRACE) 1 MG tablet   Oral   Take 1 mg by mouth daily.         Marland Kitchen ezetimibe (ZETIA) 10 MG tablet   Oral   Take 10 mg by mouth daily.         Marland Kitchen glimepiride (AMARYL) 4 MG tablet   Oral   Take 4 mg by mouth daily before breakfast.         . isosorbide mononitrate (IMDUR) 30 MG 24 hr tablet   Oral   Take 1 tablet (30 mg total) by mouth daily.   90 tablet   3   . l-methylfolate-B6-B12 (METANX) 3-35-2 MG TABS   Oral   Take 1 tablet by mouth daily.         Marland Kitchen lisinopril (PRINIVIL,ZESTRIL) 2.5 MG tablet   Oral   Take 1 tablet (2.5 mg total) by mouth daily.   30 tablet   5   . LORazepam (ATIVAN) 0.5 MG tablet   Oral   Take 0.5 mg by mouth every 6 (six) hours as needed for anxiety.          . metoprolol succinate (TOPROL-XL) 100 MG 24 hr tablet   Oral   Take 100 mg by mouth daily. Take with or immediately following a meal.         . Multiple Vitamins-Minerals (CENTRUM SILVER PO)   Oral   Take 1 tablet by mouth daily.         . nitroGLYCERIN (NITROSTAT) 0.4 MG SL tablet   Sublingual   Place 0.4 mg under the tongue every 5 (five) minutes as needed for chest pain.         . rosuvastatin (CRESTOR) 20 MG tablet   Oral   Take 20 mg by mouth every evening.          . saxagliptin HCl (ONGLYZA) 5 MG TABS tablet   Oral   Take 5 mg by mouth daily.         . traMADol (ULTRAM) 50 MG tablet   Oral   Take 1 tablet (50 mg total) by mouth every 6 (six) hours as needed for moderate pain.   30 tablet   0   . pantoprazole (PROTONIX) 40 MG tablet   Oral   Take 1 tablet (40 mg total) by mouth daily.   30 tablet   1    BP 176/64  Pulse 56  Resp 15  SpO2 100% Physical  Exam  Constitutional: She  is oriented to person, place, and time. She appears well-developed and well-nourished. No distress.  HENT:  Head: Normocephalic and atraumatic.  Right Ear: Hearing normal.  Left Ear: Hearing normal.  Nose: Nose normal.  Mouth/Throat: Oropharynx is clear and moist and mucous membranes are normal.  Eyes: Conjunctivae and EOM are normal. Pupils are equal, round, and reactive to light.  Neck: Normal range of motion. Neck supple.  Cardiovascular: Regular rhythm, S1 normal and S2 normal.  Exam reveals no gallop and no friction rub.   No murmur heard. Pulmonary/Chest: Effort normal and breath sounds normal. No respiratory distress. She exhibits no tenderness.  Abdominal: Soft. Normal appearance and bowel sounds are normal. There is no hepatosplenomegaly. There is no tenderness. There is no rebound, no guarding, no tenderness at McBurney's point and negative Murphy's sign. No hernia.  Musculoskeletal: Normal range of motion.  Neurological: She is alert and oriented to person, place, and time. She has normal strength. No cranial nerve deficit or sensory deficit. Coordination normal. GCS eye subscore is 4. GCS verbal subscore is 5. GCS motor subscore is 6.  Normal finger to nose Negative pronator drift Positive Romberg  Skin: Skin is warm, dry and intact. No rash noted. No cyanosis.  Psychiatric: She has a normal mood and affect. Her speech is normal and behavior is normal. Thought content normal.    ED Course  Procedures (including critical care time) Labs Review Labs Reviewed  CBC WITH DIFFERENTIAL - Abnormal; Notable for the following:    Hemoglobin 11.7 (*)    All other components within normal limits  BASIC METABOLIC PANEL - Abnormal; Notable for the following:    Glucose, Bld 157 (*)    GFR calc non Af Amer 61 (*)    GFR calc Af Amer 71 (*)    All other components within normal limits  TROPONIN I   Imaging Review Ct Head Wo Contrast  01/02/2014   CLINICAL DATA:  Intermittent  dizziness.  Headache.  EXAM: CT HEAD WITHOUT CONTRAST  TECHNIQUE: Contiguous axial images were obtained from the base of the skull through the vertex without intravenous contrast.  COMPARISON:  Brain MRI 09/25/2006.  FINDINGS: Patchy and confluent areas of decreased attenuation are noted throughout the deep and periventricular white matter of the cerebral hemispheres bilaterally, compatible with chronic microvascular ischemic disease. No acute intracranial abnormalities. Specifically, no evidence of acute intracranial hemorrhage, no definite findings of acute/subacute cerebral ischemia, no mass, mass effect, hydrocephalus or abnormal intra or extra-axial fluid collections. Visualized paranasal sinuses and mastoids are well pneumatized. No acute displaced skull fractures are identified.  IMPRESSION: 1. No acute intracranial abnormalities. 2. Mild chronic microvascular ischemic changes in the cerebral white matter.   Electronically Signed   By: Vinnie Langton M.D.   On: 01/02/2014 10:33    EKG Interpretation    Date/Time:  Wednesday January 02 2014 10:33:48 EST Ventricular Rate:  57 PR Interval:  198 QRS Duration: 94 QT Interval:  472 QTC Calculation: 460 R Axis:   62 Text Interpretation:  Age not entered, assumed to be  80 years old for purpose of ECG interpretation Ventricular-paced complexes No further rhythm analysis attempted due to paced rhythm RSR' in V1 or V2, right VCD or RVH Nonspecific T abnormalities, anterior leads No significant change since last tracing Confirmed by POLLINA  MD, CHRISTOPHER ([id number]) on 01/02/2014 11:35:08 AM            MDM  Diagnosis: 1. Central Vertigo 2.  Neurologic deficit  Patient presents to ER for evaluation of 2 days of intermittent headache, vertiginous dizziness and visual disturbance. Patient is hypertensive. She has a history of elevated blood pressure previously controlled with export. She recently had her medications changed to lisinopril and has had  resulting elevated blood pressures.  Examination here in the ER did not reveal any neurologic deficit. She has equal strength in all extremities. She denied any facial droop. She had normal fine motor, however did have positive Romberg. Initial CT did not show any obvious abnormality.  Repeat examination of the patient reveals that she has not had any significant improvement with gentle hydration and Valium. She is still experiencing dizziness. She now indicates that she is feeling numbness of the left side of her mouth. I still do not appreciate any facial droop. Patient's blood pressure did improve with the Valium, now 160s over 80s. Because of the concern for possible stroke, no further blood pressure treatment will be necessary.  No consult will be obtained. Patient will be admitted to the hospitalist service for further workup.  Addendum: Discussed with Doctor Leonel Ramsay, on call for neurology. He recommends MRI brain to evaluate for further evaluation for possible central vertigo as well as subjective neurodeficit (decreased sensation of face) and he will see the patient in consult.  Orpah Greek, MD 01/02/14 [place], MD 01/02/14 [place], MD 05/01/14 [phone]

## 2014-01-02 NOTE — ED Notes (Signed)
Family at bedside. 

## 2014-01-02 NOTE — ED Notes (Signed)
Daphnee, RN on 4N updated.

## 2014-01-02 NOTE — ED Notes (Signed)
Pt. In MRI.

## 2014-01-02 NOTE — H&P (Signed)
[facility] History and Physical  [name] [id number] DOB: 08/02/1933 DOA: 01/02/2014  Referring physician:  PCP: Dwan Bolt, MD  Specialists:   Chief Complaint: dizziness   HPI: [name] is a 80 y.o. female with PMH of HTN, DM, HPL, CAD s/p CABG, PAD presented with intermittent dizziness, vertigo at times positional lasting few minutes associated with nausea, and gait ataxia; denies focal neuro weakness, but had some L arm tingling; no chest pain, no SOB, no palpitations;   Review of Systems: The patient denies anorexia, fever, weight loss,, vision loss, decreased hearing, hoarseness, chest pain, syncope, dyspnea on exertion, peripheral edema, balance deficits, hemoptysis, abdominal pain, melena, hematochezia, severe indigestion/heartburn, hematuria, incontinence, genital sores, muscle weakness, suspicious skin lesions, transient blindness,  depression, unusual weight change, abnormal bleeding, enlarged lymph nodes, angioedema, and breast masses.    Past Medical History  Diagnosis Date  . CAD (coronary artery disease) 1998    Hx CABG 1998, neg nuc 4/14; echo 02/22/11-EF 50-55%, mild pul htn, inf wall hypokinesis; nuc 04/03/13-no ischemia; Cath 11/2013 - Non-obstructive CAD  . Hypertension   . PAD (peripheral artery disease)     severe calcified pop and tibial peroneal trunk disease bil  . Hyperlipemia   . Chest pain   . DM (diabetes mellitus), type 2 with peripheral vascular complications     On oral medications  . GERD (gastroesophageal reflux disease)   . Claudication, lifestyle limiting 05/16/2013  . S/P angioplasty with stent, 05/15/13, Diamondback rot. atherectomy of high grade segmental popliteal stenosis then Chocolate balloonPTA; then angiosculpt PTA of the prox peroneal with stent-Xpedition  05/16/2013  . Bradycardia, mild briefly to the 40s, asymptomatic 05/16/2013  . Peptic ulcer disease   . Chronic anemia    Past Surgical History  Procedure Laterality  Date  . Cardiac catheterization  07/17/01    2 v CAD with LM, circ, LAD, obtuse diag, mod stenosis of diag vein graft , mild stenosis of marg vein graft, nl EF, medical treatment  . Coronary angioplasty  1992    PCI to LAD  . Coronary artery bypass graft  1998    LIMA-LAD, SVG-diagonal (none roughly 50% stenosis), SVG-OM  . Tubal ligation    . Back surgery    . Stent to peoneal  05/15/13    Diamondback rot. atherectomy of high grade segmental popliteal stenosis then Chocolate balloonPTA; then angiosculpt PTA of the prox peroneal with stent-Xpedition   . Pv angio  07/20/13    high-grade calcified popliteal disease with one-vessel runoff via a small anterior tibial artery that has proximal disease as well, no intervention  . Cardiac catheterization  11/2013    Widely patent LIMA-LAD & SVG-OM with mild progression of proximal stenosis ~40-50% in SVG-D1; Widely patent native RCA with known severe native LCA disease  . Transthoracic echocardiogram  March 2012    Mild inferior-apical hypokinesis; EF 50-55%. Aortic sclerosis. PA pressure 30-40 mmHg.   Social History:  reports that she has never smoked. She has never used smokeless tobacco. She reports that she does not drink alcohol or use illicit drugs. Home;  where does patient live--home, ALF, SNF? and with whom if at home? Yes;  Can patient participate in ADLs?  Allergies  Allergen Reactions  . Fish Allergy Hives and Shortness Of Breath  . Iohexol Shortness Of Breath  . Ivp Dye [Iodinated Diagnostic Agents] Shortness Of Breath  . Latex Hives, Shortness Of Breath and Itching    REACTION: wheezing  . Shellfish Allergy  Hives and Shortness Of Breath    All seafood  . Glucophage [Metformin Hcl] Other (See Comments)    Instructed by physician not to take anymore     No family history on file. no h/o CVA (be sure to complete)  Prior to Admission medications   Medication Sig Start Date End Date Taking? Authorizing Provider  acetaminophen  (TYLENOL) 500 MG tablet Take 500-1,000 mg by mouth 2 (two) times daily as needed for headache.   Yes Historical Provider, MD  aspirin 81 MG EC tablet Take 1 tablet (81 mg total) by mouth daily. 05/16/13  Yes Cecilie Kicks, NP  clopidogrel (PLAVIX) 75 MG tablet Take 75 mg by mouth daily.   Yes Historical Provider, MD  Coenzyme Q10 (CO Q 10 PO) Take 1 tablet by mouth daily.    Yes Historical Provider, MD  estradiol (ESTRACE) 1 MG tablet Take 1 mg by mouth daily.   Yes Historical Provider, MD  ezetimibe (ZETIA) 10 MG tablet Take 10 mg by mouth daily.   Yes Historical Provider, MD  glimepiride (AMARYL) 4 MG tablet Take 4 mg by mouth daily before breakfast.   Yes Historical Provider, MD  isosorbide mononitrate (IMDUR) 30 MG 24 hr tablet Take 1 tablet (30 mg total) by mouth daily. 12/03/13  Yes [name], MD  l-methylfolate-B6-B12 [facility]) 3-35-2 MG TABS Take 1 tablet by mouth daily.   Yes Historical Provider, MD  lisinopril (PRINIVIL,ZESTRIL) 2.5 MG tablet Take 1 tablet (2.5 mg total) by mouth daily. 11/28/13  Yes [name] Simmons, PA-C  LORazepam (ATIVAN) 0.5 MG tablet Take 0.5 mg by mouth every 6 (six) hours as needed for anxiety.    Yes Historical Provider, MD  metoprolol succinate (TOPROL-XL) 100 MG 24 hr tablet Take 100 mg by mouth daily. Take with or immediately following a meal.   Yes Historical Provider, MD  Multiple Vitamins-Minerals (CENTRUM SILVER PO) Take 1 tablet by mouth daily.   Yes Historical Provider, MD  nitroGLYCERIN (NITROSTAT) 0.4 MG SL tablet Place 0.4 mg under the tongue every 5 (five) minutes as needed for chest pain.   Yes Historical Provider, MD  rosuvastatin (CRESTOR) 20 MG tablet Take 20 mg by mouth every evening.    Yes Historical Provider, MD  saxagliptin HCl (ONGLYZA) 5 MG TABS tablet Take 5 mg by mouth daily.   Yes Historical Provider, MD  traMADol (ULTRAM) 50 MG tablet Take 1 tablet (50 mg total) by mouth every 6 (six) hours as needed for moderate pain. 12/05/13  Yes  Luke K Kilroy, PA-C  pantoprazole (PROTONIX) 40 MG tablet Take 1 tablet (40 mg total) by mouth daily. 05/16/13   Cecilie Kicks, NP   Physical Exam: Filed Vitals:   01/02/14 1221  BP: 192/67  Pulse: 51  Resp: 17     General:  alert  Eyes: EOM-I,   ENT: no oral ulcers   Neck: supple  Cardiovascular: s1,s2 rrr  Respiratory: CTA BL  Abdomen: soft, nt, nd   Skin: no rash  Musculoskeletal: no le edema  Psychiatric: no hallucinations   Neurologic: CN 2-12 intact, motor 5/5; + dix haul pike, but with vertical nystagmus   Labs on Admission:  Basic Metabolic Panel:  Recent Labs Lab 01/02/14 0926  NA 140  K 4.6  CL 100  CO2 23  GLUCOSE 157*  BUN 16  CREATININE 0.88  CALCIUM 9.0   Liver Function Tests: No results found for this basename: AST, ALT, ALKPHOS, BILITOT, PROT, ALBUMIN,  in the last 168  hours No results found for this basename: LIPASE, AMYLASE,  in the last 168 hours No results found for this basename: AMMONIA,  in the last 168 hours CBC:  Recent Labs Lab 01/02/14 0926  WBC 7.2  NEUTROABS 4.9  HGB 11.7*  HCT 36.7  MCV 86.2  PLT 290   Cardiac Enzymes:  Recent Labs Lab 01/02/14 0926  TROPONINI <0.30    BNP (last 3 results) No results found for this basename: PROBNP,  in the last 8760 hours CBG: No results found for this basename: GLUCAP,  in the last 168 hours  Radiological Exams on Admission: Ct Head Wo Contrast  01/02/2014   CLINICAL DATA:  Intermittent dizziness.  Headache.  EXAM: CT HEAD WITHOUT CONTRAST  TECHNIQUE: Contiguous axial images were obtained from the base of the skull through the vertex without intravenous contrast.  COMPARISON:  Brain MRI 09/25/2006.  FINDINGS: Patchy and confluent areas of decreased attenuation are noted throughout the deep and periventricular white matter of the cerebral hemispheres bilaterally, compatible with chronic microvascular ischemic disease. No acute intracranial abnormalities. Specifically, no  evidence of acute intracranial hemorrhage, no definite findings of acute/subacute cerebral ischemia, no mass, mass effect, hydrocephalus or abnormal intra or extra-axial fluid collections. Visualized paranasal sinuses and mastoids are well pneumatized. No acute displaced skull fractures are identified.  IMPRESSION: 1. No acute intracranial abnormalities. 2. Mild chronic microvascular ischemic changes in the cerebral white matter.   Electronically Signed   By: Vinnie Langton M.D.   On: 01/02/2014 10:33    EKG: Independently reviewed. Nsr, rbbb  Assessment/Plan Principal Problem:   Vertigo, central Active Problems:   DM   CAD (coronary artery disease), hx of CABG in 1998, last nuc 03/2013 low risk study    HTN (hypertension)  80 y.o. female with PMH of HTN, DM, HPL, CAD s/p CABG, PAD presented with intermittent dizziness, vertigo, gait ataxia   1. Vertigo, gait ataxia possible BPPV+ dix haulpike but vertical nystagmus; multiple risk factors need to r/o post circ CVA; CT: no acute findings  -obtain MRI,  monitor tele, neurology c/s; cont ASA, plavix, statin; prn meclizine; PT/OT eval.  2. HTN uncontrolled; resume home meds; titrate per response, hydralazine prn  3. CAD s/p CABG; no acute chest pain; cont home regimen   4. DM HAC-7.2; hold PO meds; ISS    5. DJD, d/c tramadol ? related to dizziness; cont tylenol   Neurology  if consultant consulted, please document name and whether formally or informally consulted ED d/w neuro  Code Status: full (must indicate code status--if unknown or must be presumed, indicate so) Family Communication: d/w patient, husband, daughter  (indicate person spoken with, if applicable, with phone number if by telephone) Disposition Plan: 24-48 hours  (indicate anticipated LOS)  Time spent: >35 minutes   Kinnie Feil [facility] Pager [phone]  If 7PM-7AM, please contact night-coverage [email or web address] Password [email or web address] 01/02/2014, 1:32  PM

## 2014-01-03 ENCOUNTER — Ambulatory Visit: Payer: Medicare Other | Admitting: Gastroenterology

## 2014-01-03 DIAGNOSIS — I10 Essential (primary) hypertension: Secondary | ICD-10-CM | POA: Diagnosis not present

## 2014-01-03 DIAGNOSIS — E119 Type 2 diabetes mellitus without complications: Secondary | ICD-10-CM | POA: Diagnosis not present

## 2014-01-03 DIAGNOSIS — H814 Vertigo of central origin: Secondary | ICD-10-CM | POA: Diagnosis not present

## 2014-01-03 LAB — GLUCOSE, CAPILLARY
GLUCOSE-CAPILLARY: 143 mg/dL — AB (ref 70–99)
GLUCOSE-CAPILLARY: 94 mg/dL (ref 70–99)
GLUCOSE-CAPILLARY: 147 mg/dL — AB (ref 70–99)
Glucose-Capillary: 128 mg/dL — ABNORMAL HIGH (ref 70–99)
Glucose-Capillary: 150 mg/dL — ABNORMAL HIGH (ref 70–99)

## 2014-01-03 LAB — HEMOGLOBIN A1C
HEMOGLOBIN A1C: 7.1 % — AB (ref <5.7)
MEAN PLASMA GLUCOSE: 157 mg/dL — AB (ref <117)

## 2014-01-03 MED ORDER — METOPROLOL SUCCINATE ER 50 MG PO TB24: 50.0000 mg | ORAL_TABLET | Freq: Every day | ORAL | Status: DC

## 2014-01-03 MED ORDER — SODIUM CHLORIDE 0.9 % IV BOLUS (SEPSIS)
500.0000 mL | Freq: Once | INTRAVENOUS | Status: AC
  Administered 2014-01-03: 500 mL via INTRAVENOUS

## 2014-01-03 MED ORDER — ISOSORBIDE MONONITRATE 15 MG HALF TABLET
15.0000 mg | ORAL_TABLET | Freq: Every day | ORAL | Status: DC
  Administered 2014-01-04 – 2014-01-05 (×2): 15 mg via ORAL
  Filled 2014-01-03 (×2): qty 1

## 2014-01-03 MED ORDER — HYDROCODONE-ACETAMINOPHEN 5-325 MG PO TABS
1.0000 | ORAL_TABLET | Freq: Four times a day (QID) | ORAL | Status: DC | PRN
  Administered 2014-01-03 – 2014-01-04 (×3): 1 via ORAL
  Filled 2014-01-03 (×4): qty 1

## 2014-01-03 NOTE — Progress Notes (Signed)
Subjective: contineus to have some vertigo, but also complains of a different dizziness this morning. She describes it as a lightheadedness  Exam: Filed Vitals:   01/03/14 0404  BP: 110/52  Pulse: 56  Temp: 98.1 F (36.7 C)  Resp: 18   Gen: In bed, NAD MS: Awake, alert, interactive and appropriate HE:RDEYC, EOMI Motor: MAEW Sensory:intact to LT  MRI reviewed - no cva   Impression: 80 yo F with BPPV and hypertensive urgency. MRI shows no signs of stroke. I suspect her dizziness this mronign is multifactorial from lowering her BP as well as BPPV.   Recommendations: 1)PT/OT for vestibular rehab.  2) Can use meclizine or valium PRN if vertigo is bad 3) Nor further neurodiagnostic studies indicated. Neurology will sign off, please call with any further questions or concerns.   Roland Rack, MD [facility] [phone]  If 7pm- 7am, please page neurology on call at [phone].

## 2014-01-03 NOTE — Progress Notes (Signed)
UR completed 

## 2014-01-03 NOTE — Progress Notes (Addendum)
[facility] PROGRESS NOTE  DANYEAL GAMEL [id number] DOB: 12-Aug-1933 DOA: 01/02/2014 PCP: Dwan Bolt, MD  Assessment/Plan: Dizziness/lightheaded dizziness- MRI negative; PT/Ot for vestibular rehab, suspect related to drop in blood pressure; PRN meclizine  DM- SSI, check HgbA1C  Code Status: full Family Communication: patient Disposition Plan: hope for d/c after PT Eval/AM   Consultants:  neuro  Procedures:    Antibiotics:    HPI/Subjective: Still feeling "lightheaded"  Objective: Filed Vitals:   01/03/14 1005  BP: 106/60  Pulse: 52  Temp: 97.8 F (36.6 C)  Resp: 18   No intake or output data in the 24 hours ending 01/03/14 1235 Filed Weights   01/02/14 1830  Weight: 76.522 kg (168 lb 11.2 oz)    Exam:   General:  A+Ox3, NAD  Cardiovascular: rrr  Respiratory: clear anterior  Abdomen: +Bs, soft  Musculoskeletal: moves all 4 ext   Data Reviewed: Basic Metabolic Panel:  Recent Labs Lab 01/02/14 0926  NA 140  K 4.6  CL 100  CO2 23  GLUCOSE 157*  BUN 16  CREATININE 0.88  CALCIUM 9.0   Liver Function Tests: No results found for this basename: AST, ALT, ALKPHOS, BILITOT, PROT, ALBUMIN,  in the last 168 hours No results found for this basename: LIPASE, AMYLASE,  in the last 168 hours No results found for this basename: AMMONIA,  in the last 168 hours CBC:  Recent Labs Lab 01/02/14 0926  WBC 7.2  NEUTROABS 4.9  HGB 11.7*  HCT 36.7  MCV 86.2  PLT 290   Cardiac Enzymes:  Recent Labs Lab 01/02/14 0926  TROPONINI <0.30   BNP (last 3 results) No results found for this basename: PROBNP,  in the last 8760 hours CBG:  Recent Labs Lab 01/02/14 1825 01/02/14 2250 01/03/14 0750 01/03/14 1152  GLUCAP 87 128* 150* 143*    No results found for this or any previous visit (from the past 240 hour(s)).   Studies: Ct Head Wo Contrast  01/02/2014   CLINICAL DATA:  Intermittent dizziness.  Headache.  EXAM: CT HEAD  WITHOUT CONTRAST  TECHNIQUE: Contiguous axial images were obtained from the base of the skull through the vertex without intravenous contrast.  COMPARISON:  Brain MRI 09/25/2006.  FINDINGS: Patchy and confluent areas of decreased attenuation are noted throughout the deep and periventricular white matter of the cerebral hemispheres bilaterally, compatible with chronic microvascular ischemic disease. No acute intracranial abnormalities. Specifically, no evidence of acute intracranial hemorrhage, no definite findings of acute/subacute cerebral ischemia, no mass, mass effect, hydrocephalus or abnormal intra or extra-axial fluid collections. Visualized paranasal sinuses and mastoids are well pneumatized. No acute displaced skull fractures are identified.  IMPRESSION: 1. No acute intracranial abnormalities. 2. Mild chronic microvascular ischemic changes in the cerebral white matter.   Electronically Signed   By: Vinnie Langton M.D.   On: 01/02/2014 10:33   Mr Jodene Nam Head Wo Contrast  01/02/2014   CLINICAL DATA:  Dizziness and vertigo that associated with nausea and ataxia. Left arm tingling. Stroke.  EXAM: MRI HEAD WITHOUT CONTRAST  MRA HEAD WITHOUT CONTRAST  TECHNIQUE: Multiplanar, multiecho pulse sequences of the brain and surrounding structures were obtained without intravenous contrast. Angiographic images of the head were obtained using MRA technique without contrast.  COMPARISON:  Head CT 01/02/2014, brain MRI 09/25/2006 and MRA 10/02/2004  FINDINGS: MRI HEAD FINDINGS  There is no acute infarct or intracranial hemorrhage. Ventricles and sulci are within normal limits for age. Patchy periventricular T2 hyperintensities have slightly progressed from  the prior study and are compatible with minimal chronic small vessel ischemic disease. There is no evidence of mass, midline shift, or extra-axial fluid collection. Major intracranial vascular flow voids are unremarkable. Prior bilateral cataract surgery is noted. There  is mild left maxillary sinus mucosal thickening. Mastoid air cells are clear.  MRA HEAD FINDINGS  The visualized distal vertebral arteries are patent and codominant. PICA origins are patent bilaterally. Basilar artery is patent without stenosis. SCA origins are patent. Mild attenuation of the distal left PCA is unchanged. PCAs are otherwise unremarkable. There is a patent right posterior communicating artery. Internal carotid arteries are patent from skullbase to carotid termini. MCA origins are patent. Left MCA is unremarkable. Attenuation/decreased flow related enhancement within superior division M2 branches on the right has increased from the prior study. The right A1 segment is absent. The left ACA is unremarkable. Right A2 is supplied by the anterior communicating artery. No intracranial aneurysm is identified.  IMPRESSION: 1. No evidence of acute intracranial abnormality. 2. Minimal chronic small vessel ischemic disease, slightly progressed from 2007. 3. No evidence of major intracranial arterial occlusion. Decreased flow within M2 superior division MCA branches on the right, progressed since the prior study.   Electronically Signed   By: Logan Bores   On: 01/02/2014 18:02   Mr Brain Wo Contrast  01/02/2014   CLINICAL DATA:  Dizziness and vertigo that associated with nausea and ataxia. Left arm tingling. Stroke.  EXAM: MRI HEAD WITHOUT CONTRAST  MRA HEAD WITHOUT CONTRAST  TECHNIQUE: Multiplanar, multiecho pulse sequences of the brain and surrounding structures were obtained without intravenous contrast. Angiographic images of the head were obtained using MRA technique without contrast.  COMPARISON:  Head CT 01/02/2014, brain MRI 09/25/2006 and MRA 10/02/2004  FINDINGS: MRI HEAD FINDINGS  There is no acute infarct or intracranial hemorrhage. Ventricles and sulci are within normal limits for age. Patchy periventricular T2 hyperintensities have slightly progressed from the prior study and are compatible with  minimal chronic small vessel ischemic disease. There is no evidence of mass, midline shift, or extra-axial fluid collection. Major intracranial vascular flow voids are unremarkable. Prior bilateral cataract surgery is noted. There is mild left maxillary sinus mucosal thickening. Mastoid air cells are clear.  MRA HEAD FINDINGS  The visualized distal vertebral arteries are patent and codominant. PICA origins are patent bilaterally. Basilar artery is patent without stenosis. SCA origins are patent. Mild attenuation of the distal left PCA is unchanged. PCAs are otherwise unremarkable. There is a patent right posterior communicating artery. Internal carotid arteries are patent from skullbase to carotid termini. MCA origins are patent. Left MCA is unremarkable. Attenuation/decreased flow related enhancement within superior division M2 branches on the right has increased from the prior study. The right A1 segment is absent. The left ACA is unremarkable. Right A2 is supplied by the anterior communicating artery. No intracranial aneurysm is identified.  IMPRESSION: 1. No evidence of acute intracranial abnormality. 2. Minimal chronic small vessel ischemic disease, slightly progressed from 2007. 3. No evidence of major intracranial arterial occlusion. Decreased flow within M2 superior division MCA branches on the right, progressed since the prior study.   Electronically Signed   By: Logan Bores   On: 01/02/2014 18:02    Scheduled Meds: . aspirin  81 mg Oral Daily  . atorvastatin  10 mg Oral q1800  . clopidogrel  75 mg Oral Daily  . enoxaparin (LOVENOX) injection  40 mg Subcutaneous Q24H  . estradiol  1 mg Oral Daily  .  ezetimibe  10 mg Oral Daily  . insulin aspart  0-9 Units Subcutaneous TID WC  . isosorbide mononitrate  30 mg Oral Daily  . l-methylfolate-B6-B12  1 tablet Oral Daily  . lisinopril  2.5 mg Oral Daily  . [START ON 01/04/2014] metoprolol succinate  50 mg Oral Daily  . pantoprazole  40 mg Oral Daily   . sodium chloride  3 mL Intravenous Q12H   Continuous Infusions: . sodium chloride 75 mL/hr at 01/03/14 1030    Principal Problem:   Vertigo, central Active Problems:   DM   CAD (coronary artery disease), hx of CABG in 1998, last nuc 03/2013 low risk study    HTN (hypertension)   Vertigo    Time spent: 35 min    Yarelis Ambrosino  [facility] Pager [phone]. If 7PM-7AM, please contact night-coverage at [email or web address], password [facility] 01/03/2014, 12:35 PM  LOS: 1 day

## 2014-01-03 NOTE — Evaluation (Signed)
Physical Therapy Vestibular Evaluation Patient Details Name: [name] MRN: [id number] DOB: 23-Oct-1933 Today's Date: 01/03/2014 Time: 2637-8588 PT Time Calculation (min): 49 min  PT Assessment / Plan / Recommendation History of Present Illness  Patient is 80 y/o female admitted with left hand and facial numbness and dizziness and elevated BP.Marland Kitchen  Clinical Impression  Patient presents with decreased independence with mobility due to imbalance and BPPV as well as complaint of LE weakness.  Feel she will benefit from skilled PT in the acute setting to allow return home with intermittent help from family.  Demonstrates symptoms consistent with left posterior canal BPPV; however this does NOT account for her intermittent numbness and tingling of left hand and face.  Copious education for difference in symptoms and not to discount potential for stroke or mini-stroke thinking it is vertigo.  Will need follow up HHPT for vestibular rehab.  Would benefit from overnight stay and another PT visit prior to d/c tomorrow.    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    Equipment Recommendations  Rolling walker with 5" wheels    Recommendations for Other Services   None  Frequency Min 3X/week    Precautions / Restrictions Precautions Precautions: Fall   Pertinent Vitals/Pain Left cervical stiffness/pain      Mobility  Bed Mobility Overal bed mobility: Modified Independent Transfers Overall transfer level: Needs assistance Transfers: Sit to/from Stand;Stand Pivot Transfers Sit to Stand: Supervision Stand pivot transfers: Supervision General transfer comment: for safety due to  imbalance Ambulation/Gait Ambulation/Gait assistance: Min assist Ambulation Distance (Feet): 100 Feet Assistive device: None Gait Pattern/deviations: Step-through pattern;Decreased stride length;Wide base of support Gait velocity interpretation: Below normal speed for age/gender General Gait Details: cautious and held only me as I held onto her; cues for eyes on stationary target as walking to diminish symptoms; stopped 2-3 times during ambulation to refocus Modified Rankin (Stroke Patients Only) Pre-Morbid Rankin Score: No symptoms Modified Rankin: Moderately severe disability    Vestibular  Subjective: patient describes symptoms as spinning to the left occurs lying down and sitting up lasting few seconds.  States also has had numbness and tingling left face and hand and general weakness in LE's.  No hearing or vision changes, no falls and no recent URI's.  Did note back in Dec some ringing in left ear Oculomotor: Intact smooth pursuits, saccades, with no double vision or difficulty with oculomotor coordination; noted no gaze holding nystagmus or visual field deficits.  Reports has glasses at home for both distance and reading (bifocals) Vestibular: mild to moderate difficulty with target maintenance with VOR horizontal and vertical over 30 seconds, demonstrated no nystagmus with horizontal head shake test, but did increase symptoms.  Head thrust test positive bilaterally for refixation saccade Positional: modified dix hall pike to left positive for symptoms (pt with eyes closed during testing); right hall pike dix negative for symptoms or nystagmus; left hall pike positive for rotary nystagmus to left lasting about 15 seconds and positive for symptoms, treated x 2 with Eply's canalith repositioning maneuver.   PT Diagnosis: Abnormality of gait;Other (comment) (vertigo)  PT Problem List: Decreased strength;Decreased balance;Decreased mobility;Decreased safety awareness;Decreased knowledge of use  of DME;Other (comment) (vertigo) PT Treatment Interventions: DME  instruction;Gait training;Functional mobility training;Patient/family education;Therapeutic activities;Therapeutic exercise;Balance training;Neuromuscular re-education;Other (comment) (canalith repositioning, vestibular rehab)     PT Goals(Current goals can be found in the care plan section) Acute Rehab PT Goals Patient Stated Goal: To go home when feeling better PT Goal Formulation: With patient Time For Goal Achievement: 01/10/14 Potential to Achieve Goals: Good  Visit Information  Last PT Received On: 01/03/14 Assistance Needed: +1 History of Present Illness: Patient is 80 y/o female admitted with left hand and facial numbness and dizziness and elevated BP.Marland Kitchen       Prior [place] expects to be discharged to:: Private residence Living Arrangements: Spouse/significant other Type of Home: House Home Access: [place]: One [place]: Walker - 2 wheels;Grab bars - tub/shower;Cane - quad;Cane - single point (spouse's walker and canes) Additional Comments: spouse with Alzheimers and pt is his caregiver; states working on looking for placement for him as son cannot take care of him Prior Function Level of Independence: Independent Communication Communication: No difficulties Dominant Hand: Right    Cognition  Cognition Arousal/Alertness: Awake/alert Behavior During Therapy: WFL for tasks assessed/performed Overall Cognitive Status: Within Functional Limits for tasks assessed    Extremity/Trunk Assessment Upper Extremity Assessment Upper Extremity Assessment: Overall WFL for tasks assessed Lower Extremity Assessment Lower Extremity Assessment: RLE deficits/detail;LLE deficits/detail RLE Deficits / Details: not formally tested, but pt relates weakness with walking LLE Deficits / Details: not formally tested, but pt relates weakness with walking   Balance  Balance Overall balance assessment: Needs assistance Sitting-balance support: Feet unsupported Sitting balance-Leahy Scale: Good Standing balance-Leahy Scale: Poor Standing balance comment: holds onto items for standing, walking and transfers High level balance activites: Head turns High Level Balance Comments: no increase symptoms or weaving with slow head turn to look right and left  End of Session PT - End of Session Equipment Utilized During Treatment: Gait belt Activity Tolerance: Other (comment) (symptoms of nausea beginning so tx terminated) Patient left: in chair;with call bell/phone within reach  GP Functional Assessment Tool Used: Clinical Judgement Functional Limitation: Mobility: Walking and moving around Mobility: Walking and Moving Around Current Status (G2694): At least 20 percent but less than 40 percent impaired, limited or restricted Mobility: Walking and Moving Around Goal Status [phone]): At least 1 percent but less than 20 percent impaired, limited or restricted   [facility] 01/03/2014, 2:20 PM  [place], [place] 01/03/2014

## 2014-01-04 DIAGNOSIS — E785 Hyperlipidemia, unspecified: Secondary | ICD-10-CM | POA: Diagnosis not present

## 2014-01-04 DIAGNOSIS — I498 Other specified cardiac arrhythmias: Secondary | ICD-10-CM

## 2014-01-04 DIAGNOSIS — I2589 Other forms of chronic ischemic heart disease: Secondary | ICD-10-CM

## 2014-01-04 DIAGNOSIS — H814 Vertigo of central origin: Secondary | ICD-10-CM | POA: Diagnosis not present

## 2014-01-04 DIAGNOSIS — I10 Essential (primary) hypertension: Secondary | ICD-10-CM | POA: Diagnosis not present

## 2014-01-04 LAB — BASIC METABOLIC PANEL
BUN: 11 mg/dL (ref 6–23)
CO2: 24 meq/L (ref 19–32)
Calcium: 8.5 mg/dL (ref 8.4–10.5)
Chloride: 106 mEq/L (ref 96–112)
Creatinine, Ser: 0.85 mg/dL (ref 0.50–1.10)
GFR calc Af Amer: 74 mL/min — ABNORMAL LOW (ref >90)
GFR, EST NON AFRICAN AMERICAN: 64 mL/min — AB (ref >90)
Glucose, Bld: 119 mg/dL — ABNORMAL HIGH (ref 70–99)
POTASSIUM: 4.3 meq/L (ref 3.7–5.3)
SODIUM: 140 meq/L (ref 137–147)

## 2014-01-04 LAB — GLUCOSE, CAPILLARY
GLUCOSE-CAPILLARY: 137 mg/dL — AB (ref 70–99)
Glucose-Capillary: 106 mg/dL — ABNORMAL HIGH (ref 70–99)
Glucose-Capillary: 155 mg/dL — ABNORMAL HIGH (ref 70–99)
Glucose-Capillary: 116 mg/dL — ABNORMAL HIGH (ref 70–99)

## 2014-01-04 LAB — CBC
HCT: 34.1 % — ABNORMAL LOW (ref 36.0–46.0)
Hemoglobin: 10.8 g/dL — ABNORMAL LOW (ref 12.0–15.0)
MCH: 26.9 pg (ref 26.0–34.0)
MCHC: 31.7 g/dL (ref 30.0–36.0)
MCV: 84.8 fL (ref 78.0–100.0)
PLATELETS: 247 10*3/uL (ref 150–400)
RBC: 4.02 MIL/uL (ref 3.87–5.11)
RDW: 14.2 % (ref 11.5–15.5)
WBC: 6.3 10*3/uL (ref 4.0–10.5)

## 2014-01-04 MED ORDER — MECLIZINE HCL 12.5 MG PO TABS: 12.5000 mg | ORAL_TABLET | Freq: Two times a day (BID) | ORAL | Status: DC | PRN

## 2014-01-04 MED ORDER — HYDROCODONE-ACETAMINOPHEN 5-325 MG PO TABS
1.0000 | ORAL_TABLET | Freq: Once | ORAL | Status: AC
  Administered 2014-01-04: 1 via ORAL

## 2014-01-04 MED ORDER — HYDRALAZINE HCL 25 MG PO TABS
25.0000 mg | ORAL_TABLET | Freq: Once | ORAL | Status: AC
  Administered 2014-01-04: 25 mg via ORAL
  Filled 2014-01-04: qty 1

## 2014-01-04 MED ORDER — LISINOPRIL 5 MG PO TABS
5.0000 mg | ORAL_TABLET | Freq: Every day | ORAL | Status: DC
  Administered 2014-01-05: 5 mg via ORAL
  Filled 2014-01-04: qty 1

## 2014-01-04 MED ORDER — LISINOPRIL 2.5 MG PO TABS
2.5000 mg | ORAL_TABLET | Freq: Once | ORAL | Status: AC
  Administered 2014-01-04: 2.5 mg via ORAL
  Filled 2014-01-04: qty 1

## 2014-01-04 MED ORDER — AMOXICILLIN 500 MG PO CAPS
500.0000 mg | ORAL_CAPSULE | Freq: Three times a day (TID) | ORAL | Status: DC
  Administered 2014-01-04 – 2014-01-05 (×3): 500 mg via ORAL
  Filled 2014-01-04 (×8): qty 1

## 2014-01-04 MED ORDER — AMOXICILLIN 500 MG PO CAPS: 500.0000 mg | ORAL_CAPSULE | Freq: Three times a day (TID) | ORAL | Status: DC

## 2014-01-04 MED ORDER — LISINOPRIL 5 MG PO TABS: 5.0000 mg | ORAL_TABLET | Freq: Every day | ORAL | Status: DC

## 2014-01-04 NOTE — Evaluation (Signed)
Occupational Therapy Evaluation Patient Details Name: [name] MRN: [id number] DOB: Aug 30, 1933 Today's Date: 01/04/2014 Time: 1031-5945 OT Time Calculation (min): 23 min  OT Assessment / Plan / Recommendation History of present illness Patient is 80 y/o female admitted with left hand and facial numbness and dizziness and elevated BP.Marland Kitchen   Clinical Impression   PT admitted with Lt hand and facial numbness in addition to dizziness. Pt currently with functional limitiations due to the deficits listed below (see OT problem list). 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  Pt will benefit from skilled OT to increase their independence and safety with adls and balance to allow discharge [place].      OT Assessment  Patient needs continued OT Services    Follow Up Recommendations  Home health OT;Supervision - Intermittent    Barriers to Discharge Decreased caregiver support    Equipment Recommendations  None recommended by OT    Recommendations for Other Services    Frequency  Min 2X/week    Precautions / Restrictions Precautions Precautions: Fall Restrictions Weight Bearing Restrictions: No   Pertinent Vitals/Pain C/o pain moving Right to left and states "its not a migraine Its pressure" pt also with cotton ball in Left ear and describes pain in 01/03/14 PM as severe throbbing sensation eased with ice pack    ADL  Grooming: Teeth care;Wash/dry face;Wash/dry hands;Supervision/safety Where Assessed - Grooming: Unsupported standing Toilet Transfer: Copy Method: Sit to Loss adjuster, chartered: Regular height toilet Tub/Shower Transfer: English as a second language teacher Method: Production designer, theatre/television/film with back Equipment Used: Gait belt ADL Comments: Pt very concerned with managing husband upon d/c home and lack of support. pt currently staying with son with amputation  and daughter can only provide minimal help,. Dr Eliseo Squires entering room at end of session and informed of patients concerns with d/c home today. Pt also remains with headache and sensation changes in face.    OT Diagnosis: Generalized weakness  OT Problem List: Decreased strength;Decreased activity tolerance;Impaired balance (sitting and/or standing);Decreased safety awareness;Pain OT Treatment Interventions: Self-care/ADL training;Therapeutic exercise;DME and/or AE instruction;Therapeutic activities;Patient/family education;Balance training   OT Goals(Current goals can be found in the care plan section) Acute Rehab OT Goals Patient Stated Goal: to feel better before I go home OT Goal Formulation: With patient Time For Goal Achievement: 01/18/14 Potential to Achieve Goals: Good  Visit Information  Last OT Received On: 01/04/14 Assistance Needed: +1 History of Present Illness: Patient is 80 y/o female admitted with left hand and facial numbness and dizziness and elevated BP.Marland Kitchen       Prior [place] expects to be discharged to:: Private residence Living Arrangements: Spouse/significant other (spouse has Alzheimers) Available Help at Discharge: Available PRN/intermittently;Home health Type of Home: House Home Access: [place]: One [place]: [place] - 2 wheels;Grab bars - tub/shower;Cane - quad;Cane - single point Additional Comments: spouse with Alzheimers and pt is his caregiver; states working on looking for placement for him as son cannot take care of him. Son has assisted with pt's spouse but son as tramatic amputation. Daughter works 6 days a week until 7pm. Pt has friend that can give (A) occassionally Prior Function Level of Independence: Independent Communication Communication: No difficulties Dominant Hand: Right         Vision/Perception Vision - History Patient Visual Report: Other (comment) (reports glasses  feel odd wearing them on her face)  Cognition  Cognition Arousal/Alertness: Awake/alert Behavior During Therapy: WFL for tasks assessed/performed Overall Cognitive Status: Within Functional Limits for tasks assessed    Extremity/Trunk Assessment Upper Extremity Assessment Upper Extremity Assessment: Overall WFL for tasks assessed Lower Extremity Assessment Lower Extremity Assessment: Defer to PT evaluation     Mobility Bed Mobility Overal bed mobility: Modified Independent Transfers Overall transfer level: Needs assistance Equipment used: None Transfers: Sit to/from Stand Sit to Stand: Supervision Stand pivot transfers: Supervision General transfer comment: for safety due to imbalance     Exercise Other Exercises Other Exercises: Subjective:  Patient describes symptoms as heaviness of head with some lightheadedness.  Reports no spinning during session today. Other Exercises: Oculomotor:  No nystagmus noted with any oculomotor tests. Other Exercises: Vestibular: Other Exercises: Positional:  Performed Marye Round to both sides with no change in symptoms and no nystagmus.  Performed roll test to both sides - no nystagmus or change in symptoms noted.   Balance Balance Overall balance assessment: Needs assistance Sitting-balance support: No upper extremity supported;Feet supported Sitting balance-Leahy Scale: Good Standing balance support: No upper extremity supported Standing balance-Leahy Scale: Fair Standing balance comment: Able to maintain balance without holding on to objects today.   End of Session OT - End of Session Activity Tolerance: Patient tolerated treatment well Patient left: in chair;with call bell/phone within reach;Other (comment) (MD Eliseo Squires in room) Nurse Communication: Mobility status;Precautions  GO Functional Assessment Tool Used: clinical judgement Functional Limitation: Self care Self Care Current Status [phone]): At least 1 percent but less than 20  percent impaired, limited or restricted Self Care Goal Status (Y8144): At least 1 percent but less than 20 percent impaired, limited or restricted   Peri Maris 01/04/2014, 2:21 PM Pager: [phone]

## 2014-01-04 NOTE — Care Management Note (Signed)
    Page 1 of 1   01/06/2014     9:18:36 AM   CARE MANAGEMENT NOTE 01/06/2014  Patient:  SAVANA, SPINA   Account Number:  [id number]  Date Initiated:  01/04/2014  Documentation initiated by:  Lorne Skeens  Subjective/Objective Assessment:   Patient admitted with vertigo.     Action/Plan:   Will follow for discharge needs   Anticipated DC Date:  01/04/2014   Anticipated DC Plan:  [place]  CM consult      Choice offered to / List presented to:  C-1 Patient        [facility] [place] arranged  HH-1 RN  [place]   Status of service:  Completed, signed off Medicare Important Message given?   (If response is "NO", the following Medicare IM given date fields will be blank) Date Medicare IM given:   Date Additional Medicare IM given:    Discharge Disposition:  [place]  Per UR Regulation:    If discussed at Long Length of Stay Meetings, dates discussed:    Comments:  01/05/14 CM notified [facility] of discharge.  No othr CM needs were comunicated.  Mariane Masters, BSN, [place] [phone].  01/04/14 [place] RN, MSN, CM- Met with patient to discuss home health.  Patient is agreeable and has chosen [facility].  Rem[facility] with [facility] was notified and has accepted the referral for probable discharge home later this evening.

## 2014-01-04 NOTE — Consult Note (Signed)
CARDIOLOGY CONSULT NOTE   Patient ID: [name] MRN: [id number], DOB/AGE: 1933-05-25   Admit date: 01/02/2014 Date of Consult: 01/04/2014   Primary Physician: Dwan Bolt, MD Primary Cardiologist: [name]  Pt. Profile  This 80 year old woman was admitted for dizziness and lightheadedness.  She was being treated for vertigo and will be receiving PT and OT for vestibular rehabilitation.  While on telemetry she was noted to have bradycardia and cardiology is asked to assess.  Problem List  Past Medical History  Diagnosis Date  . CAD (coronary artery disease) 1998    Hx CABG 1998, neg nuc 4/14; echo 02/22/11-EF 50-55%, mild pul htn, inf wall hypokinesis; nuc 04/03/13-no ischemia; Cath 11/2013 - Non-obstructive CAD  . Hypertension   . PAD (peripheral artery disease)     severe calcified pop and tibial peroneal trunk disease bil  . Hyperlipemia   . Chest pain   . DM (diabetes mellitus), type 2 with peripheral vascular complications     On oral medications  . GERD (gastroesophageal reflux disease)   . Claudication, lifestyle limiting 05/16/2013  . S/P angioplasty with stent, 05/15/13, Diamondback rot. atherectomy of high grade segmental popliteal stenosis then Chocolate balloonPTA; then angiosculpt PTA of the prox peroneal with stent-Xpedition  05/16/2013  . Bradycardia, mild briefly to the 40s, asymptomatic 05/16/2013  . Peptic ulcer disease   . Chronic anemia     Past Surgical History  Procedure Laterality Date  . Cardiac catheterization  07/17/01    2 v CAD with LM, circ, LAD, obtuse diag, mod stenosis of diag vein graft , mild stenosis of marg vein graft, nl EF, medical treatment  . Coronary angioplasty  1992    PCI to LAD  . Coronary artery bypass graft  1998    LIMA-LAD, SVG-diagonal (none roughly 50% stenosis), SVG-OM  . Tubal ligation    . Back surgery    . Stent to peoneal  05/15/13    Diamondback rot. atherectomy of high grade segmental popliteal stenosis then  Chocolate balloonPTA; then angiosculpt PTA of the prox peroneal with stent-Xpedition   . Pv angio  07/20/13    high-grade calcified popliteal disease with one-vessel runoff via a small anterior tibial artery that has proximal disease as well, no intervention  . Cardiac catheterization  11/2013    Widely patent LIMA-LAD & SVG-OM with mild progression of proximal stenosis ~40-50% in SVG-D1; Widely patent native RCA with known severe native LCA disease  . Transthoracic echocardiogram  March 2012    Mild inferior-apical hypokinesis; EF 50-55%. Aortic sclerosis. PA pressure 30-40 mmHg.     Allergies  Allergies  Allergen Reactions  . Fish Allergy Hives and Shortness Of Breath  . Iohexol Shortness Of Breath  . Ivp Dye [Iodinated Diagnostic Agents] Shortness Of Breath  . Latex Hives, Shortness Of Breath and Itching    REACTION: wheezing  . Shellfish Allergy Hives and Shortness Of Breath    All seafood  . Glucophage [Metformin Hcl] Other (See Comments)    Instructed by physician not to take anymore     HPI   This 80 year old woman has a long and complex cardiac history.  She had coronary artery bypass graft surgery in 1998.  She had chest discomfort in December 2014 and underwent urgent cardiac catheterization which revealed no significant changes to her coronary anatomy.  She has had peripheral arterial occlusive disease and has had procedures on her right leg by Dr. Gwenlyn Found and more recently revascularization intervention on her left leg  by Dr. Brunetta Jeans.  She has had a history of hypertension, hyperlipidemia and diabetes mellitus.  She has had a past history of bradycardia in June 2014 which was asymptomatic.  During this admission she has been having wide  fluctuations in her blood pressure and her pulse rate.  Telemetry has shown sinus bradycardia into the 40s.  Her metoprolol succinate patient dose had been 100 mg daily.  In the hospital the dose of metoprolol was reduced to 50 mg and then  subsequently stopped altogether.  Her more recent heart rates have shown improvement up into the 60s.  She denies any dizziness at the present time.  She has not been experiencing any recurrent chest pain or angina.  Inpatient Medications  . amoxicillin  500 mg Oral Q8H  . aspirin  81 mg Oral Daily  . atorvastatin  10 mg Oral q1800  . clopidogrel  75 mg Oral Daily  . enoxaparin (LOVENOX) injection  40 mg Subcutaneous Q24H  . estradiol  1 mg Oral Daily  . ezetimibe  10 mg Oral Daily  . insulin aspart  0-9 Units Subcutaneous TID WC  . isosorbide mononitrate  15 mg Oral Daily  . l-methylfolate-B6-B12  1 tablet Oral Daily  . [START ON 01/05/2014] lisinopril  5 mg Oral Daily  . pantoprazole  40 mg Oral Daily  . sodium chloride  3 mL Intravenous Q12H    Family History Family History  Problem Relation Age of Onset  . Hypertension Mother   . Hyperlipidemia Mother   . Hyperlipidemia Father   . Hypertension Father      Social History History   Social History  . Marital Status: Married    Spouse Name: N/A    Number of Children: N/A  . Years of Education: N/A   Occupational History  . Not on file.   Social History Main Topics  . Smoking status: Never Smoker   . Smokeless tobacco: Never Used  . Alcohol Use: No     Comment: rare glass of wine   . Drug Use: No  . Sexual Activity: Not on file   Other Topics Concern  . Not on file   Social History Narrative    She is a married mother of 58, grandmother of 75, great grandmother of 34.    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    She does not really get a lot of exercise,    She does not smoke or drink EtOH.     Review of Systems  General:  No chills, fever, night sweats or weight changes.  Cardiovascular:  No chest pain, dyspnea on exertion, edema, orthopnea, palpitations, paroxysmal nocturnal dyspnea. Dermatological: No rash, lesions/masses Respiratory: No cough, dyspnea Urologic: No hematuria,  dysuria Abdominal:   No nausea, vomiting, diarrhea, bright red blood per rectum, melena, or hematemesis Neurologic:  No visual changes, wkns, changes in mental status. All other systems reviewed and are otherwise negative except as noted above.  Physical Exam  Blood pressure 149/69, pulse 58, temperature 98.2 F (36.8 C), temperature source Oral, resp. rate 20, height 5\' 3"  (1.6 m), weight 168 lb 11.2 oz (76.522 kg), SpO2 97.00%.  General: Pleasant, NAD Psych: Normal affect. Neuro: Alert and oriented X 3. Moves all extremities spontaneously. HEENT: Normal  Neck: Supple without bruits or JVD. Lungs:  Resp regular and unlabored, CTA. Heart: RRR no s3, s4, or murmurs. Abdomen: Soft, non-tender, non-distended, BS + x 4.  Extremities: No clubbing, cyanosis  or edema. DP/PT/Radials 2+ and equal bilaterally.  Labs   Recent Labs  01/02/14 0926  TROPONINI <0.30   Lab Results  Component Value Date   WBC 6.3 01/04/2014   HGB 10.8* 01/04/2014   HCT 34.1* 01/04/2014   MCV 84.8 01/04/2014   PLT 247 01/04/2014     Recent Labs Lab 01/04/14 0500  NA 140  K 4.3  CL 106  CO2 24  BUN 11  CREATININE 0.85  CALCIUM 8.5  GLUCOSE 119*   Lab Results  Component Value Date   CHOL 145 11/27/2013   HDL 45 11/27/2013   LDLCALC 85 11/27/2013   TRIG 77 11/27/2013   No results found for this basename: DDIMER    Radiology/Studies  Ct Head Wo Contrast  01/02/2014   CLINICAL DATA:  Intermittent dizziness.  Headache.  EXAM: CT HEAD WITHOUT CONTRAST  TECHNIQUE: Contiguous axial images were obtained from the base of the skull through the vertex without intravenous contrast.  COMPARISON:  Brain MRI 09/25/2006.  FINDINGS: Patchy and confluent areas of decreased attenuation are noted throughout the deep and periventricular white matter of the cerebral hemispheres bilaterally, compatible with chronic microvascular ischemic disease. No acute intracranial abnormalities. Specifically, no evidence of acute  intracranial hemorrhage, no definite findings of acute/subacute cerebral ischemia, no mass, mass effect, hydrocephalus or abnormal intra or extra-axial fluid collections. Visualized paranasal sinuses and mastoids are well pneumatized. No acute displaced skull fractures are identified.  IMPRESSION: 1. No acute intracranial abnormalities. 2. Mild chronic microvascular ischemic changes in the cerebral white matter.   Electronically Signed   By: Vinnie Langton M.D.   On: 01/02/2014 10:33   Mr Jodene Nam Head Wo Contrast  01/02/2014   CLINICAL DATA:  Dizziness and vertigo that associated with nausea and ataxia. Left arm tingling. Stroke.  EXAM: MRI HEAD WITHOUT CONTRAST  MRA HEAD WITHOUT CONTRAST  TECHNIQUE: Multiplanar, multiecho pulse sequences of the brain and surrounding structures were obtained without intravenous contrast. Angiographic images of the head were obtained using MRA technique without contrast.  COMPARISON:  Head CT 01/02/2014, brain MRI 09/25/2006 and MRA 10/02/2004  FINDINGS: MRI HEAD FINDINGS  There is no acute infarct or intracranial hemorrhage. Ventricles and sulci are within normal limits for age. Patchy periventricular T2 hyperintensities have slightly progressed from the prior study and are compatible with minimal chronic small vessel ischemic disease. There is no evidence of mass, midline shift, or extra-axial fluid collection. Major intracranial vascular flow voids are unremarkable. Prior bilateral cataract surgery is noted. There is mild left maxillary sinus mucosal thickening. Mastoid air cells are clear.  MRA HEAD FINDINGS  The visualized distal vertebral arteries are patent and codominant. PICA origins are patent bilaterally. Basilar artery is patent without stenosis. SCA origins are patent. Mild attenuation of the distal left PCA is unchanged. PCAs are otherwise unremarkable. There is a patent right posterior communicating artery. Internal carotid arteries are patent from skullbase to  carotid termini. MCA origins are patent. Left MCA is unremarkable. Attenuation/decreased flow related enhancement within superior division M2 branches on the right has increased from the prior study. The right A1 segment is absent. The left ACA is unremarkable. Right A2 is supplied by the anterior communicating artery. No intracranial aneurysm is identified.  IMPRESSION: 1. No evidence of acute intracranial abnormality. 2. Minimal chronic small vessel ischemic disease, slightly progressed from 2007. 3. No evidence of major intracranial arterial occlusion. Decreased flow within M2 superior division MCA branches on the right, progressed since the prior study.  Electronically Signed   By: Logan Bores   On: 01/02/2014 18:02   Mr Brain Wo Contrast  01/02/2014   CLINICAL DATA:  Dizziness and vertigo that associated with nausea and ataxia. Left arm tingling. Stroke.  EXAM: MRI HEAD WITHOUT CONTRAST  MRA HEAD WITHOUT CONTRAST  TECHNIQUE: Multiplanar, multiecho pulse sequences of the brain and surrounding structures were obtained without intravenous contrast. Angiographic images of the head were obtained using MRA technique without contrast.  COMPARISON:  Head CT 01/02/2014, brain MRI 09/25/2006 and MRA 10/02/2004  FINDINGS: MRI HEAD FINDINGS  There is no acute infarct or intracranial hemorrhage. Ventricles and sulci are within normal limits for age. Patchy periventricular T2 hyperintensities have slightly progressed from the prior study and are compatible with minimal chronic small vessel ischemic disease. There is no evidence of mass, midline shift, or extra-axial fluid collection. Major intracranial vascular flow voids are unremarkable. Prior bilateral cataract surgery is noted. There is mild left maxillary sinus mucosal thickening. Mastoid air cells are clear.  MRA HEAD FINDINGS  The visualized distal vertebral arteries are patent and codominant. PICA origins are patent bilaterally. Basilar artery is patent without  stenosis. SCA origins are patent. Mild attenuation of the distal left PCA is unchanged. PCAs are otherwise unremarkable. There is a patent right posterior communicating artery. Internal carotid arteries are patent from skullbase to carotid termini. MCA origins are patent. Left MCA is unremarkable. Attenuation/decreased flow related enhancement within superior division M2 branches on the right has increased from the prior study. The right A1 segment is absent. The left ACA is unremarkable. Right A2 is supplied by the anterior communicating artery. No intracranial aneurysm is identified.  IMPRESSION: 1. No evidence of acute intracranial abnormality. 2. Minimal chronic small vessel ischemic disease, slightly progressed from 2007. 3. No evidence of major intracranial arterial occlusion. Decreased flow within M2 superior division MCA branches on the right, progressed since the prior study.   Electronically Signed   By: Logan Bores   On: 01/02/2014 18:02    ECG  EKG shows sinus bradycardia and nonspecific ST-T wave changes.  No acute ischemic changes.  ASSESSMENT AND PLAN  1. marked sinus bradycardia improved since stopping beta blocker.  No indication or need for pacemaker at this point. 2. hypertensive cardiovascular disease 3. ischemic heart disease status post CABG.  Recent cardiac catheterization December 2014 showed no change in anatomy and she did not require any additional intervention.  She remains on aspirin and Plavix and isosorbide mononitrate and statin therapy and ezetimibe. 4. Vertigo 5. diabetes mellitus 6. hypercholesterolemia  Recommendation: Agree with stopping beta blocker at this point because of bradycardia.  She is being started on lisinopril by the primary team.  If blood pressure remains high I would also consider adding amlodipine.  She previously had tolerated Exforge which is a combination of amlodipine and valsartan.  The Exforge was somehow discontinued during her previous  admission for chest pain in December but according to the patient she thought she was tolerating it okay.  If her heart rate increases adequately it may be possible to restart metoprolol in very low dosage such as 12.5 mg  daily since the patient does have a past history of ischemic heart disease and angina pectoris.  Signed, Darlin Coco, MD  01/04/2014, 6:08 PM

## 2014-01-04 NOTE — Progress Notes (Signed)
[facility] PROGRESS NOTE  [name] OJJ:[id number] DOB: 1933-01-30 DOA: 01/02/2014 PCP: Dwan Bolt, MD  Assessment/Plan: Dizziness/lightheaded dizziness- MRI negative; PT/Ot for vestibular rehab, suspect related to drop in blood pressure; will ask cards to see as profoundly bradycardic on BB and BP swings from 937 systolically to 16R with bradycardia D/c BB Increase lisinopril  Ear pain- will start abx while I locate an otoscope for suspected inner ear infection on left ear  DM- SSI, check HgbA1C  Code Status: full Family Communication: patient Disposition Plan: hope for d/c this PM   Consultants:  neuro  Procedures:    Antibiotics:    HPI/Subjective: C/o ear pain  Objective: Filed Vitals:   01/04/14 1136  BP: 150/84  Pulse:   Temp:   Resp:     Intake/Output Summary (Last 24 hours) at 01/04/14 1150 Last data filed at 01/03/14 1700  Gross per 24 hour  Intake    460 ml  Output      0 ml  Net    460 ml   Filed Weights   01/02/14 1830  Weight: 76.522 kg (168 lb 11.2 oz)    Exam:   General:  A+Ox3, NAD  Cardiovascular: rrr  Respiratory: clear anterior  Abdomen: +Bs, soft  Musculoskeletal: moves all 4 ext   Data Reviewed: Basic Metabolic Panel:  Recent Labs Lab 01/02/14 0926 01/04/14 0500  NA 140 140  K 4.6 4.3  CL 100 106  CO2 23 24  GLUCOSE 157* 119*  BUN 16 11  CREATININE 0.88 0.85  CALCIUM 9.0 8.5   Liver Function Tests: No results found for this basename: AST, ALT, ALKPHOS, BILITOT, PROT, ALBUMIN,  in the last 168 hours No results found for this basename: LIPASE, AMYLASE,  in the last 168 hours No results found for this basename: AMMONIA,  in the last 168 hours CBC:  Recent Labs Lab 01/02/14 0926 01/04/14 0500  WBC 7.2 6.3  NEUTROABS 4.9  --   HGB 11.7* 10.8*  HCT 36.7 34.1*  MCV 86.2 84.8  PLT 290 247   Cardiac Enzymes:  Recent Labs Lab 01/02/14 0926  TROPONINI <0.30   BNP (last 3  results) No results found for this basename: PROBNP,  in the last 8760 hours CBG:  Recent Labs Lab 01/03/14 1152 01/03/14 1717 01/03/14 2149 01/04/14 0651 01/04/14 1111  GLUCAP 143* 94 147* 106* 155*    No results found for this or any previous visit (from the past 240 hour(s)).   Studies: Mr Virgel Paling Wo Contrast  01/02/2014   CLINICAL DATA:  Dizziness and vertigo that associated with nausea and ataxia. Left arm tingling. Stroke.  EXAM: MRI HEAD WITHOUT CONTRAST  MRA HEAD WITHOUT CONTRAST  TECHNIQUE: Multiplanar, multiecho pulse sequences of the brain and surrounding structures were obtained without intravenous contrast. Angiographic images of the head were obtained using MRA technique without contrast.  COMPARISON:  Head CT 01/02/2014, brain MRI 09/25/2006 and MRA 10/02/2004  FINDINGS: MRI HEAD FINDINGS  There is no acute infarct or intracranial hemorrhage. Ventricles and sulci are within normal limits for age. Patchy periventricular T2 hyperintensities have slightly progressed from the prior study and are compatible with minimal chronic small vessel ischemic disease. There is no evidence of mass, midline shift, or extra-axial fluid collection. Major intracranial vascular flow voids are unremarkable. Prior bilateral cataract surgery is noted. There is mild left maxillary sinus mucosal thickening. Mastoid air cells are clear.  MRA HEAD FINDINGS  The visualized distal vertebral arteries are patent  and codominant. PICA origins are patent bilaterally. Basilar artery is patent without stenosis. SCA origins are patent. Mild attenuation of the distal left PCA is unchanged. PCAs are otherwise unremarkable. There is a patent right posterior communicating artery. Internal carotid arteries are patent from skullbase to carotid termini. MCA origins are patent. Left MCA is unremarkable. Attenuation/decreased flow related enhancement within superior division M2 branches on the right has increased from the prior  study. The right A1 segment is absent. The left ACA is unremarkable. Right A2 is supplied by the anterior communicating artery. No intracranial aneurysm is identified.  IMPRESSION: 1. No evidence of acute intracranial abnormality. 2. Minimal chronic small vessel ischemic disease, slightly progressed from 2007. 3. No evidence of major intracranial arterial occlusion. Decreased flow within M2 superior division MCA branches on the right, progressed since the prior study.   Electronically Signed   By: Logan Bores   On: 01/02/2014 18:02   Mr Brain Wo Contrast  01/02/2014   CLINICAL DATA:  Dizziness and vertigo that associated with nausea and ataxia. Left arm tingling. Stroke.  EXAM: MRI HEAD WITHOUT CONTRAST  MRA HEAD WITHOUT CONTRAST  TECHNIQUE: Multiplanar, multiecho pulse sequences of the brain and surrounding structures were obtained without intravenous contrast. Angiographic images of the head were obtained using MRA technique without contrast.  COMPARISON:  Head CT 01/02/2014, brain MRI 09/25/2006 and MRA 10/02/2004  FINDINGS: MRI HEAD FINDINGS  There is no acute infarct or intracranial hemorrhage. Ventricles and sulci are within normal limits for age. Patchy periventricular T2 hyperintensities have slightly progressed from the prior study and are compatible with minimal chronic small vessel ischemic disease. There is no evidence of mass, midline shift, or extra-axial fluid collection. Major intracranial vascular flow voids are unremarkable. Prior bilateral cataract surgery is noted. There is mild left maxillary sinus mucosal thickening. Mastoid air cells are clear.  MRA HEAD FINDINGS  The visualized distal vertebral arteries are patent and codominant. PICA origins are patent bilaterally. Basilar artery is patent without stenosis. SCA origins are patent. Mild attenuation of the distal left PCA is unchanged. PCAs are otherwise unremarkable. There is a patent right posterior communicating artery. Internal carotid  arteries are patent from skullbase to carotid termini. MCA origins are patent. Left MCA is unremarkable. Attenuation/decreased flow related enhancement within superior division M2 branches on the right has increased from the prior study. The right A1 segment is absent. The left ACA is unremarkable. Right A2 is supplied by the anterior communicating artery. No intracranial aneurysm is identified.  IMPRESSION: 1. No evidence of acute intracranial abnormality. 2. Minimal chronic small vessel ischemic disease, slightly progressed from 2007. 3. No evidence of major intracranial arterial occlusion. Decreased flow within M2 superior division MCA branches on the right, progressed since the prior study.   Electronically Signed   By: Logan Bores   On: 01/02/2014 18:02    Scheduled Meds: . amoxicillin  500 mg Oral Q8H  . aspirin  81 mg Oral Daily  . atorvastatin  10 mg Oral q1800  . clopidogrel  75 mg Oral Daily  . enoxaparin (LOVENOX) injection  40 mg Subcutaneous Q24H  . estradiol  1 mg Oral Daily  . ezetimibe  10 mg Oral Daily  . insulin aspart  0-9 Units Subcutaneous TID WC  . isosorbide mononitrate  15 mg Oral Daily  . l-methylfolate-B6-B12  1 tablet Oral Daily  . lisinopril  2.5 mg Oral Once  . [START ON 01/05/2014] lisinopril  5 mg Oral Daily  .  pantoprazole  40 mg Oral Daily  . sodium chloride  3 mL Intravenous Q12H   Continuous Infusions:    Principal Problem:   Vertigo, central Active Problems:   DM   CAD (coronary artery disease), hx of CABG in 1998, last nuc 03/2013 low risk study    HTN (hypertension)   Vertigo    Time spent: 35 min    [name]  [facility] Pager [phone]. If 7PM-7AM, please contact night-coverage at [email or web address], password [facility] 01/04/2014, 11:50 AM  LOS: 2 days

## 2014-01-04 NOTE — Progress Notes (Signed)
Physical Therapy Treatment Patient Details Name: [name] MRN: [id number] DOB: Apr 28, 1933 Today's Date: 01/04/2014 Time: 2025-4270 PT Time Calculation (min): 32 min  PT Assessment / Plan / Recommendation  History of Present Illness Patient is 80 y/o female admitted with left hand and facial numbness and dizziness and elevated BP.Marland Kitchen   PT Comments   Patient making some improvement with dizziness.  Symptoms headache, "heaviness", and imbalance impacting safety with functional mobility.  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  Son recently had traumatic amputation of LE.  May need additional assist at home for patient and/or husband.  Agree with need for HHPT.  Patient currently does not have transport home (per patient report).  Follow Up Recommendations  Home health PT;Supervision - Intermittent     Does the patient have the potential to tolerate intense rehabilitation     Barriers to Discharge        Equipment Recommendations  Rolling walker with 5" wheels    Recommendations for Other Services    Frequency Min 3X/week   Progress towards PT Goals Progress towards PT goals: Progressing toward goals  Plan Current plan remains appropriate    Precautions / Restrictions Precautions Precautions: Fall Restrictions Weight Bearing Restrictions: No   Pertinent Vitals/Pain Headache present - RN notified.    Mobility  Bed Mobility Overal bed mobility: Modified Independent Transfers Overall transfer level: Needs assistance Equipment used: None Transfers: Sit to/from Stand Sit to Stand: Supervision Stand pivot transfers: Supervision General transfer comment: for safety due to imbalance Ambulation/Gait Ambulation/Gait assistance: Min guard Ambulation Distance (Feet): 120 Feet Assistive device: None Gait Pattern/deviations: Step-through pattern;Decreased step length - right;Decreased step length - left;Staggering right Gait velocity: Slow, guarded gait Gait velocity  interpretation: Below normal speed for age/gender General Gait Details: Verbal cues to use target during gait to diminish symptoms.  Noted staggering gait x2 to right side - able to self correct by stopping. Modified Rankin (Stroke Patients Only) Pre-Morbid Rankin Score: No symptoms Modified Rankin: Moderately severe disability    Exercises Other Exercises Other Exercises: Subjective:  Patient describes symptoms as heaviness of head with some lightheadedness.  Reports no spinning during session today. Other Exercises: Oculomotor:  No nystagmus noted with any oculomotor tests. Other Exercises: Vestibular: Other Exercises: Positional:  Performed Marye Round to both sides with no change in symptoms and no nystagmus.  Performed roll test to both sides - no nystagmus or change in symptoms noted.     PT Goals (current goals can now be found in the care plan section)    Visit Information  Last PT Received On: 01/04/14 Assistance Needed: +1 History of Present Illness: Patient is 80 y/o female admitted with left hand and facial numbness and dizziness and elevated BP.Marland Kitchen    Subjective Data  Subjective: My head just feels heavy today.  Reports symptoms today at 4-5/10.   Cognition  Cognition Arousal/Alertness: Awake/alert Behavior During Therapy: WFL for tasks assessed/performed Overall Cognitive Status: Within Functional Limits for tasks assessed    Balance  Balance Overall balance assessment: Needs assistance Sitting-balance support: No upper extremity supported;Feet supported Sitting balance-Leahy Scale: Good Standing balance support: No upper extremity supported Standing balance-Leahy Scale: Fair Standing balance comment: Able to maintain balance without holding on to objects today.  End of Session PT - End of Session Equipment Utilized During Treatment: Gait belt Activity Tolerance: Patient limited by fatigue;Patient limited by pain (Limited by headache) Patient left: in chair;with  call bell/phone within reach (with OT) Nurse Communication:  Mobility status;Patient requests pain meds   GP     Despina Pole 01/04/2014, 10:44 AM  Carita Pian. Sanjuana Kava, [place] Pager [phone]

## 2014-01-05 DIAGNOSIS — H6690 Otitis media, unspecified, unspecified ear: Secondary | ICD-10-CM

## 2014-01-05 DIAGNOSIS — H814 Vertigo of central origin: Secondary | ICD-10-CM | POA: Diagnosis not present

## 2014-01-05 DIAGNOSIS — I498 Other specified cardiac arrhythmias: Secondary | ICD-10-CM | POA: Diagnosis not present

## 2014-01-05 LAB — GLUCOSE, CAPILLARY
Glucose-Capillary: 112 mg/dL — ABNORMAL HIGH (ref 70–99)
Glucose-Capillary: 115 mg/dL — ABNORMAL HIGH (ref 70–99)

## 2014-01-05 NOTE — Discharge Summary (Signed)
Physician Discharge Summary  [name] [id number] DOB: February 18, 1933 DOA: 01/02/2014  PCP: Dwan Bolt, MD  Admit date: 01/02/2014 Discharge date: 01/07/2014  Time spent: 35 minutes  Recommendations for Outpatient Follow-up:  1. Home health  Discharge Diagnoses:  Principal Problem:   Vertigo, central Active Problems:   DM   CAD (coronary artery disease), hx of CABG in 1998, last nuc 03/2013 low risk study    HTN (hypertension)   Vertigo   Discharge Condition: improved  Diet recommendation: cardiac  Filed Weights   01/02/14 1830  Weight: 76.522 kg (168 lb 11.2 oz)    History of present illness:  [name] is a 79 y.o. female with PMH of HTN, DM, HPL, CAD s/p CABG, PAD presented with intermittent dizziness, vertigo at times positional lasting few minutes associated with nausea, and gait ataxia; denies focal neuro weakness, but had some L arm tingling; no chest pain, no SOB, no palpitations   Hospital Course:  Bradycardia  - heart rates improved off beta blocker, TSH normal at 1.5  - no further workup at this time, remain off AV nodal agents   HTN  - elevated blood pressures now off Toprol , recommend titrating up lisinopril. If additional agent needed, can start norvasc.   CAD  - on appropriate management for CAD with ASA, atorva, plavix, lisinopril. She is off beta blocker due to bradycardia   Procedures:  MRI  Consultations:  Cards  neuro  Discharge Exam: Filed Vitals:   01/05/14 1143  BP: 159/80  Pulse:   Temp:   Resp:     General: pleasant Cardiovascular: rrr Respiratory: clear  Discharge Instructions      Discharge Orders   Future Appointments Provider Department Dept Phone   03/07/2014 11:00 AM [name], MD [facility] [phone]   Future Orders Complete By Expires   Diet - low sodium heart healthy  As directed    Diet Carb Modified  As directed    Discharge instructions  As directed    Comments:      Home health- PT/OT/RN for BP checks No driving until seen by PCP   Increase activity slowly  As directed        Medication List    STOP taking these medications       metoprolol succinate 100 MG 24 hr tablet  Commonly known as:  TOPROL-XL      TAKE these medications       acetaminophen 500 MG tablet  Commonly known as:  TYLENOL  Take 500-1,000 mg by mouth 2 (two) times daily as needed for headache.     amoxicillin 500 MG capsule  Commonly known as:  AMOXIL  Take 1 capsule (500 mg total) by mouth every 8 (eight) hours.     aspirin 81 MG EC tablet  Take 1 tablet (81 mg total) by mouth daily.     CENTRUM SILVER PO  Take 1 tablet by mouth daily.     CO Q 10 PO  Take 1 tablet by mouth daily.     estradiol 1 MG tablet  Commonly known as:  ESTRACE  Take 1 mg by mouth daily.     ezetimibe 10 MG tablet  Commonly known as:  ZETIA  Take 10 mg by mouth daily.     glimepiride 4 MG tablet  Commonly known as:  AMARYL  Take 4 mg by mouth daily before breakfast.     isosorbide mononitrate 30 MG 24 hr tablet  Commonly known as:  IMDUR  Take 1 tablet (30 mg total) by mouth daily.     l-methylfolate-B6-B12 3-35-2 MG Tabs  Commonly known as:  METANX  Take 1 tablet by mouth daily.     lisinopril 5 MG tablet  Commonly known as:  PRINIVIL,ZESTRIL  Take 1 tablet (5 mg total) by mouth daily.     LORazepam 0.5 MG tablet  Commonly known as:  ATIVAN  Take 0.5 mg by mouth every 6 (six) hours as needed for anxiety.     meclizine 12.5 MG tablet  Commonly known as:  ANTIVERT  Take 1 tablet (12.5 mg total) by mouth 2 (two) times daily as needed for dizziness.     nitroGLYCERIN 0.4 MG SL tablet  Commonly known as:  NITROSTAT  Place 0.4 mg under the tongue every 5 (five) minutes as needed for chest pain.     ONGLYZA 5 MG Tabs tablet  Generic drug:  saxagliptin HCl  Take 5 mg by mouth daily.     pantoprazole 40 MG tablet  Commonly known as:  PROTONIX  Take 1 tablet (40 mg total)  by mouth daily.     PLAVIX 75 MG tablet  Generic drug:  clopidogrel  Take 75 mg by mouth daily.     rosuvastatin 20 MG tablet  Commonly known as:  CRESTOR  Take 20 mg by mouth every evening.     traMADol 50 MG tablet  Commonly known as:  ULTRAM  Take 1 tablet (50 mg total) by mouth every 6 (six) hours as needed for moderate pain.       Allergies  Allergen Reactions  . Fish Allergy Hives and Shortness Of Breath  . Iohexol Shortness Of Breath  . Ivp Dye [Iodinated Diagnostic Agents] Shortness Of Breath  . Latex Hives, Shortness Of Breath and Itching    REACTION: wheezing  . Shellfish Allergy Hives and Shortness Of Breath    All seafood  . Glucophage [Metformin Hcl] Other (See Comments)    Instructed by physician not to take anymore       The results of significant diagnostics from this hospitalization (including imaging, microbiology, ancillary and laboratory) are listed below for reference.    Significant Diagnostic Studies: Ct Head Wo Contrast  01/02/2014   CLINICAL DATA:  Intermittent dizziness.  Headache.  EXAM: CT HEAD WITHOUT CONTRAST  TECHNIQUE: Contiguous axial images were obtained from the base of the skull through the vertex without intravenous contrast.  COMPARISON:  Brain MRI 09/25/2006.  FINDINGS: Patchy and confluent areas of decreased attenuation are noted throughout the deep and periventricular white matter of the cerebral hemispheres bilaterally, compatible with chronic microvascular ischemic disease. No acute intracranial abnormalities. Specifically, no evidence of acute intracranial hemorrhage, no definite findings of acute/subacute cerebral ischemia, no mass, mass effect, hydrocephalus or abnormal intra or extra-axial fluid collections. Visualized paranasal sinuses and mastoids are well pneumatized. No acute displaced skull fractures are identified.  IMPRESSION: 1. No acute intracranial abnormalities. 2. Mild chronic microvascular ischemic changes in the cerebral  white matter.   Electronically Signed   By: Vinnie Langton M.D.   On: 01/02/2014 10:33   Mr Jodene Nam Head Wo Contrast  01/02/2014   CLINICAL DATA:  Dizziness and vertigo that associated with nausea and ataxia. Left arm tingling. Stroke.  EXAM: MRI HEAD WITHOUT CONTRAST  MRA HEAD WITHOUT CONTRAST  TECHNIQUE: Multiplanar, multiecho pulse sequences of the brain and surrounding structures were obtained without intravenous contrast. Angiographic images of the head were obtained  using MRA technique without contrast.  COMPARISON:  Head CT 01/02/2014, brain MRI 09/25/2006 and MRA 10/02/2004  FINDINGS: MRI HEAD FINDINGS  There is no acute infarct or intracranial hemorrhage. Ventricles and sulci are within normal limits for age. Patchy periventricular T2 hyperintensities have slightly progressed from the prior study and are compatible with minimal chronic small vessel ischemic disease. There is no evidence of mass, midline shift, or extra-axial fluid collection. Major intracranial vascular flow voids are unremarkable. Prior bilateral cataract surgery is noted. There is mild left maxillary sinus mucosal thickening. Mastoid air cells are clear.  MRA HEAD FINDINGS  The visualized distal vertebral arteries are patent and codominant. PICA origins are patent bilaterally. Basilar artery is patent without stenosis. SCA origins are patent. Mild attenuation of the distal left PCA is unchanged. PCAs are otherwise unremarkable. There is a patent right posterior communicating artery. Internal carotid arteries are patent from skullbase to carotid termini. MCA origins are patent. Left MCA is unremarkable. Attenuation/decreased flow related enhancement within superior division M2 branches on the right has increased from the prior study. The right A1 segment is absent. The left ACA is unremarkable. Right A2 is supplied by the anterior communicating artery. No intracranial aneurysm is identified.  IMPRESSION: 1. No evidence of acute  intracranial abnormality. 2. Minimal chronic small vessel ischemic disease, slightly progressed from 2007. 3. No evidence of major intracranial arterial occlusion. Decreased flow within M2 superior division MCA branches on the right, progressed since the prior study.   Electronically Signed   By: Logan Bores   On: 01/02/2014 18:02   Mr Brain Wo Contrast  01/02/2014   CLINICAL DATA:  Dizziness and vertigo that associated with nausea and ataxia. Left arm tingling. Stroke.  EXAM: MRI HEAD WITHOUT CONTRAST  MRA HEAD WITHOUT CONTRAST  TECHNIQUE: Multiplanar, multiecho pulse sequences of the brain and surrounding structures were obtained without intravenous contrast. Angiographic images of the head were obtained using MRA technique without contrast.  COMPARISON:  Head CT 01/02/2014, brain MRI 09/25/2006 and MRA 10/02/2004  FINDINGS: MRI HEAD FINDINGS  There is no acute infarct or intracranial hemorrhage. Ventricles and sulci are within normal limits for age. Patchy periventricular T2 hyperintensities have slightly progressed from the prior study and are compatible with minimal chronic small vessel ischemic disease. There is no evidence of mass, midline shift, or extra-axial fluid collection. Major intracranial vascular flow voids are unremarkable. Prior bilateral cataract surgery is noted. There is mild left maxillary sinus mucosal thickening. Mastoid air cells are clear.  MRA HEAD FINDINGS  The visualized distal vertebral arteries are patent and codominant. PICA origins are patent bilaterally. Basilar artery is patent without stenosis. SCA origins are patent. Mild attenuation of the distal left PCA is unchanged. PCAs are otherwise unremarkable. There is a patent right posterior communicating artery. Internal carotid arteries are patent from skullbase to carotid termini. MCA origins are patent. Left MCA is unremarkable. Attenuation/decreased flow related enhancement within superior division M2 branches on the right  has increased from the prior study. The right A1 segment is absent. The left ACA is unremarkable. Right A2 is supplied by the anterior communicating artery. No intracranial aneurysm is identified.  IMPRESSION: 1. No evidence of acute intracranial abnormality. 2. Minimal chronic small vessel ischemic disease, slightly progressed from 2007. 3. No evidence of major intracranial arterial occlusion. Decreased flow within M2 superior division MCA branches on the right, progressed since the prior study.   Electronically Signed   By: Logan Bores   On: 01/02/2014  18:02    Microbiology: No results found for this or any previous visit (from the past 240 hour(s)).   Labs: Basic Metabolic Panel:  Recent Labs Lab 01/02/14 0926 01/04/14 0500  NA 140 140  K 4.6 4.3  CL 100 106  CO2 23 24  GLUCOSE 157* 119*  BUN 16 11  CREATININE 0.88 0.85  CALCIUM 9.0 8.5   Liver Function Tests: No results found for this basename: AST, ALT, ALKPHOS, BILITOT, PROT, ALBUMIN,  in the last 168 hours No results found for this basename: LIPASE, AMYLASE,  in the last 168 hours No results found for this basename: AMMONIA,  in the last 168 hours CBC:  Recent Labs Lab 01/02/14 0926 01/04/14 0500  WBC 7.2 6.3  NEUTROABS 4.9  --   HGB 11.7* 10.8*  HCT 36.7 34.1*  MCV 86.2 84.8  PLT 290 247   Cardiac Enzymes:  Recent Labs Lab 01/02/14 0926  TROPONINI <0.30   BNP: BNP (last 3 results) No results found for this basename: PROBNP,  in the last 8760 hours CBG:  Recent Labs Lab 01/04/14 1111 01/04/14 1640 01/04/14 2301 01/05/14 0640 01/05/14 1136  GLUCAP 155* 116* 137* 112* 115*       Signed:  Enrica Corliss  [facility] 01/07/2014, 2:44 PM

## 2014-01-05 NOTE — Progress Notes (Signed)
Occupational Therapy Treatment Patient Details Name: [name] MRN: [id number] DOB: 1933/08/20 Today's Date: 01/05/2014 Time: 1110-1120 OT Time Calculation (min): 10 min  OT Assessment / Plan / Recommendation  History of present illness Patient is 80 y/o female admitted with left hand and facial numbness and dizziness and elevated BP..   OT comments  Pt. Set for d/c home today.  Able to complete home management tasks for con't. Work with dynamic balance, and pt. Was able to complete bed making task with various weight shifting movements, bending and reaching with no LOB noted.  Cont. To c/o "heaviness in left side of face and neck"  Follow Up Recommendations  Home health OT;Supervision - Intermittent           Equipment Recommendations  None recommended by OT        Frequency Min 2X/week   Progress towards OT Goals Progress towards OT goals: Progressing toward goals  Plan Discharge plan remains appropriate    Precautions / Restrictions Precautions Precautions: None Restrictions Weight Bearing Restrictions: No   Pertinent Vitals/Pain 'not pain just feels heavy" (points to face and neck on left side)    ADL  ADL Comments: con't. to present with HA and sensation changes on face and neck, left side.  worked on dynamic standing balance tasks today with integration of home management tasks.  pt. able to make bed and amb. around bed bending, weight shifting, turning with no LOB noted       OT Goals(current goals can now be found in the care plan section)    Visit Information  Last OT Received On: 01/05/14 Assistance Needed: +1 History of Present Illness: Patient is 80 y/o female admitted with left hand and facial numbness and dizziness and elevated BP.Marland Kitchen    Subjective Data   "i really think i am going to be fine"          Cognition  Cognition Arousal/Alertness: Awake/alert Behavior During Therapy: WFL for tasks assessed/performed Overall Cognitive Status: Within  Functional Limits for tasks assessed    Mobility  Bed Mobility Overal bed mobility: Modified Independent Transfers Overall transfer level: Modified independent Equipment used: None Transfers: Sit to/from Stand Sit to Stand: Modified independent (Device/Increase time) Stand pivot transfers: Supervision General transfer comment: Increased time.  Balance good in standing today.          Balance Balance Overall balance assessment: Needs assistance Standing balance support: No upper extremity supported Standing balance-Leahy Scale: Good High level balance activites: Turns;Head turns High Level Balance Comments: Loses balance with high level balance activities  End of Session OT - End of Session Activity Tolerance: Patient tolerated treatment well Patient left: in chair;with call bell/phone within reach       Janice Coffin, COTA/L 01/05/2014, 12:27 PM

## 2014-01-05 NOTE — Progress Notes (Signed)
Patient ID: JURY CASERTA, female   DOB: 07/19/1933, 80 y.o.   MRN: [id number]    Subjective:    No symptoms overnight  Objective:   Temp:  [97.5 F (36.4 C)-98.4 F (36.9 C)] 98.4 F (36.9 C) (01/24 1042) Pulse Rate:  [56-91] 64 (01/24 1042) Resp:  [20] 20 (01/24 1042) BP: (128-228)/(58-102) 182/83 mmHg (01/24 1042) SpO2:  [95 %-100 %] 99 % (01/24 1042) Last BM Date: 01/02/14  Filed Weights   01/02/14 1830  Weight: 168 lb 11.2 oz (76.522 kg)   No intake or output data in the 24 hours ending 01/05/14 1114  Telemetry: No telemetry  Exam:  General:NAD  Resp: CTAB  Cardiac: RRR, no m/r/g, no JVD, no carotid bruits  GX:QJJHERD soft, NT, nD  MSK: extremities are warm, no edema  Neuro: no focal deficits   Lab Results:  Basic Metabolic Panel:  Recent Labs Lab 01/02/14 0926 01/04/14 0500  NA 140 140  K 4.6 4.3  CL 100 106  CO2 23 24  GLUCOSE 157* 119*  BUN 16 11  CREATININE 0.88 0.85  CALCIUM 9.0 8.5    Liver Function Tests: No results found for this basename: AST, ALT, ALKPHOS, BILITOT, PROT, ALBUMIN,  in the last 168 hours  CBC:  Recent Labs Lab 01/02/14 0926 01/04/14 0500  WBC 7.2 6.3  HGB 11.7* 10.8*  HCT 36.7 34.1*  MCV 86.2 84.8  PLT 290 247    Cardiac Enzymes:  Recent Labs Lab 01/02/14 0926  TROPONINI <0.30    BNP: No results found for this basename: PROBNP,  in the last 8760 hours  Coagulation: No results found for this basename: INR,  in the last 168 hours  ECG:   Medications:   Scheduled Medications: . amoxicillin  500 mg Oral Q8H  . aspirin  81 mg Oral Daily  . atorvastatin  10 mg Oral q1800  . clopidogrel  75 mg Oral Daily  . enoxaparin (LOVENOX) injection  40 mg Subcutaneous Q24H  . estradiol  1 mg Oral Daily  . ezetimibe  10 mg Oral Daily  . insulin aspart  0-9 Units Subcutaneous TID WC  . isosorbide mononitrate  15 mg Oral Daily  . l-methylfolate-B6-B12  1 tablet Oral Daily  . lisinopril  5 mg Oral Daily  .  pantoprazole  40 mg Oral Daily  . sodium chloride  3 mL Intravenous Q12H     Infusions:     PRN Medications:  acetaminophen, acetaminophen, hydrALAZINE, HYDROcodone-acetaminophen, LORazepam, meclizine, ondansetron (ZOFRAN) IV, ondansetron     Assessment/Plan     1. Bradycardia - heart rates improved off beta blocker, TSH normal at 1.5  - no further workup at this time, remain off AV nodal agents  2. HTN - elevated blood pressures now off Toprol , recommend titrating up lisinopril. If additional agent needed, can start norvasc.   3. CAD - on appropriate management for CAD with  ASA, atorva, plavix, lisinopril. She is off beta blocker due to bradycardia.   She may follow up with her primary cardiolgist Dr Gwenlyn Found in 4-6 weeks. Will sign off of inpatient care.       Carlyle Dolly, M.D., F.A.C.C.

## 2014-01-05 NOTE — Progress Notes (Signed)
Physical Therapy Discharge Patient Details Name: [name] MRN: [id number] DOB: May 22, 1933 Today's Date: 01/05/2014 Time:  -     Patient discharged from PT services secondary to goals met and no further PT needs identified.  Please see latest therapy progress note for current level of functioning and progress toward goals.    Progress and discharge plan discussed with patient and/or caregiver: Patient/Caregiver agrees with plan  GP     Despina Pole 01/05/2014, 3:58 PM

## 2014-01-05 NOTE — Progress Notes (Signed)
Physical Therapy Treatment Patient Details Name: ROCHELE LUECK MRN: [id number] DOB: April 12, 1933 Today's Date: 01/05/2014 Time: 1937-9024 PT Time Calculation (min): 24 min  PT Assessment / Plan / Recommendation  History of Present Illness Patient is 80 y/o female admitted with left hand and facial numbness and dizziness and elevated BP.Marland Kitchen   PT Comments   Patient has achieved all acute PT goals.  To be discharged home today per patient.  Continues to have high level balance deficits - will benefit from HHPT f/u for continued therapy at discharge.  Follow Up Recommendations  Home health PT;Supervision - Intermittent     Does the patient have the potential to tolerate intense rehabilitation     Barriers to Discharge        Equipment Recommendations  None recommended by PT    Recommendations for Other Services    Frequency Min 3X/week   Progress towards PT Goals Progress towards PT goals: Goals met/education completed, patient discharged from PT  Plan Current plan remains appropriate;Equipment recommendations need to be updated    Precautions / Restrictions Precautions Precautions: None Restrictions Weight Bearing Restrictions: No   Pertinent Vitals/Pain     Mobility  Transfers Overall transfer level: Modified independent Equipment used: None Transfers: Sit to/from Stand Sit to Stand: Modified independent (Device/Increase time) General transfer comment: Increased time.  Balance good in standing today. Ambulation/Gait Ambulation/Gait assistance: Modified independent (Device/Increase time) Ambulation Distance (Feet): 200 Feet Assistive device: None Gait Pattern/deviations: Step-through pattern;Decreased stride length Gait velocity: Slow, guarded gait General Gait Details: Able to correctly use stationary visual target for gait and during turns.  Good balance with gait on level, straight surfaces.  Loses balance with head turns. Modified Rankin (Stroke Patients  Only) Pre-Morbid Rankin Score: No symptoms Modified Rankin: Slight disability      PT Goals (current goals can now be found in the care plan section)    Visit Information  Last PT Received On: 01/05/14 Assistance Needed: +1 History of Present Illness: Patient is 80 y/o female admitted with left hand and facial numbness and dizziness and elevated BP.Marland Kitchen    Subjective Data  Subjective: I'm doing better today.  No spinning.   Cognition  Cognition Arousal/Alertness: Awake/alert Behavior During Therapy: WFL for tasks assessed/performed Overall Cognitive Status: Within Functional Limits for tasks assessed    Balance  Balance Overall balance assessment: Needs assistance Standing balance support: No upper extremity supported Standing balance-Leahy Scale: Good High level balance activites: Turns;Head turns High Level Balance Comments: Loses balance with high level balance activities  End of Session PT - End of Session Equipment Utilized During Treatment: Gait belt Activity Tolerance: Patient tolerated treatment well Patient left: in bed;with call bell/phone within reach;with bed alarm set (sitting EOB) Nurse Communication: Mobility status   GP Functional Assessment Tool Used: Clinical Judgement Functional Limitation: Mobility: Walking and moving around Mobility: Walking and Moving Around Goal Status (O9735): At least 1 percent but less than 20 percent impaired, limited or restricted Mobility: Walking and Moving Around Discharge Status [id number]): At least 1 percent but less than 20 percent impaired, limited or restricted   Despina Pole 01/05/2014, 10:09 AM Carita Pian. Sanjuana Kava, [place] Pager [phone]

## 2014-01-05 NOTE — Progress Notes (Signed)
Pt d/c to home by car with family. Assessment stable. Prescriptions given. Pt verbalizes understanding of d/c instructions.

## 2014-01-06 DIAGNOSIS — E1159 Type 2 diabetes mellitus with other circulatory complications: Secondary | ICD-10-CM | POA: Diagnosis not present

## 2014-01-06 DIAGNOSIS — I798 Other disorders of arteries, arterioles and capillaries in diseases classified elsewhere: Secondary | ICD-10-CM | POA: Diagnosis not present

## 2014-01-06 DIAGNOSIS — D649 Anemia, unspecified: Secondary | ICD-10-CM | POA: Diagnosis not present

## 2014-01-06 DIAGNOSIS — R269 Unspecified abnormalities of gait and mobility: Secondary | ICD-10-CM | POA: Diagnosis not present

## 2014-01-06 DIAGNOSIS — I10 Essential (primary) hypertension: Secondary | ICD-10-CM | POA: Diagnosis not present

## 2014-01-06 DIAGNOSIS — H8110 Benign paroxysmal vertigo, unspecified ear: Secondary | ICD-10-CM | POA: Diagnosis not present

## 2014-01-09 DIAGNOSIS — I2510 Atherosclerotic heart disease of native coronary artery without angina pectoris: Secondary | ICD-10-CM | POA: Diagnosis not present

## 2014-01-09 DIAGNOSIS — E789 Disorder of lipoprotein metabolism, unspecified: Secondary | ICD-10-CM | POA: Diagnosis not present

## 2014-01-09 DIAGNOSIS — I10 Essential (primary) hypertension: Secondary | ICD-10-CM | POA: Diagnosis not present

## 2014-01-09 DIAGNOSIS — E119 Type 2 diabetes mellitus without complications: Secondary | ICD-10-CM | POA: Diagnosis not present

## 2014-01-10 DIAGNOSIS — D649 Anemia, unspecified: Secondary | ICD-10-CM | POA: Diagnosis not present

## 2014-01-10 DIAGNOSIS — E1159 Type 2 diabetes mellitus with other circulatory complications: Secondary | ICD-10-CM | POA: Diagnosis not present

## 2014-01-10 DIAGNOSIS — I798 Other disorders of arteries, arterioles and capillaries in diseases classified elsewhere: Secondary | ICD-10-CM | POA: Diagnosis not present

## 2014-01-10 DIAGNOSIS — R269 Unspecified abnormalities of gait and mobility: Secondary | ICD-10-CM | POA: Diagnosis not present

## 2014-01-10 DIAGNOSIS — H8110 Benign paroxysmal vertigo, unspecified ear: Secondary | ICD-10-CM | POA: Diagnosis not present

## 2014-01-10 DIAGNOSIS — I10 Essential (primary) hypertension: Secondary | ICD-10-CM | POA: Diagnosis not present

## 2014-01-14 DIAGNOSIS — E1159 Type 2 diabetes mellitus with other circulatory complications: Secondary | ICD-10-CM | POA: Diagnosis not present

## 2014-01-14 DIAGNOSIS — D649 Anemia, unspecified: Secondary | ICD-10-CM | POA: Diagnosis not present

## 2014-01-14 DIAGNOSIS — I798 Other disorders of arteries, arterioles and capillaries in diseases classified elsewhere: Secondary | ICD-10-CM | POA: Diagnosis not present

## 2014-01-14 DIAGNOSIS — I10 Essential (primary) hypertension: Secondary | ICD-10-CM | POA: Diagnosis not present

## 2014-01-14 DIAGNOSIS — R269 Unspecified abnormalities of gait and mobility: Secondary | ICD-10-CM | POA: Diagnosis not present

## 2014-01-14 DIAGNOSIS — H8110 Benign paroxysmal vertigo, unspecified ear: Secondary | ICD-10-CM | POA: Diagnosis not present

## 2014-01-16 DIAGNOSIS — E1159 Type 2 diabetes mellitus with other circulatory complications: Secondary | ICD-10-CM | POA: Diagnosis not present

## 2014-01-16 DIAGNOSIS — R269 Unspecified abnormalities of gait and mobility: Secondary | ICD-10-CM | POA: Diagnosis not present

## 2014-01-16 DIAGNOSIS — H8110 Benign paroxysmal vertigo, unspecified ear: Secondary | ICD-10-CM | POA: Diagnosis not present

## 2014-01-16 DIAGNOSIS — D649 Anemia, unspecified: Secondary | ICD-10-CM | POA: Diagnosis not present

## 2014-01-16 DIAGNOSIS — I798 Other disorders of arteries, arterioles and capillaries in diseases classified elsewhere: Secondary | ICD-10-CM | POA: Diagnosis not present

## 2014-01-16 DIAGNOSIS — I10 Essential (primary) hypertension: Secondary | ICD-10-CM | POA: Diagnosis not present

## 2014-01-17 DIAGNOSIS — I10 Essential (primary) hypertension: Secondary | ICD-10-CM | POA: Diagnosis not present

## 2014-01-17 DIAGNOSIS — I798 Other disorders of arteries, arterioles and capillaries in diseases classified elsewhere: Secondary | ICD-10-CM | POA: Diagnosis not present

## 2014-01-17 DIAGNOSIS — H8110 Benign paroxysmal vertigo, unspecified ear: Secondary | ICD-10-CM | POA: Diagnosis not present

## 2014-01-17 DIAGNOSIS — R269 Unspecified abnormalities of gait and mobility: Secondary | ICD-10-CM | POA: Diagnosis not present

## 2014-01-17 DIAGNOSIS — D649 Anemia, unspecified: Secondary | ICD-10-CM | POA: Diagnosis not present

## 2014-01-17 DIAGNOSIS — E1159 Type 2 diabetes mellitus with other circulatory complications: Secondary | ICD-10-CM | POA: Diagnosis not present

## 2014-01-20 ENCOUNTER — Other Ambulatory Visit (HOSPITAL_COMMUNITY): Payer: Self-pay | Admitting: Cardiology

## 2014-01-21 ENCOUNTER — Ambulatory Visit: Payer: Medicare Other | Admitting: Physician Assistant

## 2014-01-21 DIAGNOSIS — H8110 Benign paroxysmal vertigo, unspecified ear: Secondary | ICD-10-CM | POA: Diagnosis not present

## 2014-01-21 DIAGNOSIS — I10 Essential (primary) hypertension: Secondary | ICD-10-CM | POA: Diagnosis not present

## 2014-01-21 DIAGNOSIS — R269 Unspecified abnormalities of gait and mobility: Secondary | ICD-10-CM | POA: Diagnosis not present

## 2014-01-21 DIAGNOSIS — I798 Other disorders of arteries, arterioles and capillaries in diseases classified elsewhere: Secondary | ICD-10-CM | POA: Diagnosis not present

## 2014-01-21 DIAGNOSIS — D649 Anemia, unspecified: Secondary | ICD-10-CM | POA: Diagnosis not present

## 2014-01-21 DIAGNOSIS — E1159 Type 2 diabetes mellitus with other circulatory complications: Secondary | ICD-10-CM | POA: Diagnosis not present

## 2014-01-22 DIAGNOSIS — H905 Unspecified sensorineural hearing loss: Secondary | ICD-10-CM | POA: Diagnosis not present

## 2014-01-22 DIAGNOSIS — R42 Dizziness and giddiness: Secondary | ICD-10-CM | POA: Diagnosis not present

## 2014-01-22 DIAGNOSIS — H903 Sensorineural hearing loss, bilateral: Secondary | ICD-10-CM | POA: Diagnosis not present

## 2014-01-22 DIAGNOSIS — H9319 Tinnitus, unspecified ear: Secondary | ICD-10-CM | POA: Diagnosis not present

## 2014-01-22 DIAGNOSIS — H908 Mixed conductive and sensorineural hearing loss, unspecified: Secondary | ICD-10-CM | POA: Diagnosis not present

## 2014-01-23 DIAGNOSIS — E119 Type 2 diabetes mellitus without complications: Secondary | ICD-10-CM | POA: Diagnosis not present

## 2014-01-23 DIAGNOSIS — I2510 Atherosclerotic heart disease of native coronary artery without angina pectoris: Secondary | ICD-10-CM | POA: Diagnosis not present

## 2014-01-23 DIAGNOSIS — R279 Unspecified lack of coordination: Secondary | ICD-10-CM | POA: Diagnosis not present

## 2014-01-23 DIAGNOSIS — IMO0002 Reserved for concepts with insufficient information to code with codable children: Secondary | ICD-10-CM | POA: Diagnosis not present

## 2014-01-24 DIAGNOSIS — I798 Other disorders of arteries, arterioles and capillaries in diseases classified elsewhere: Secondary | ICD-10-CM | POA: Diagnosis not present

## 2014-01-24 DIAGNOSIS — H8110 Benign paroxysmal vertigo, unspecified ear: Secondary | ICD-10-CM | POA: Diagnosis not present

## 2014-01-24 DIAGNOSIS — R269 Unspecified abnormalities of gait and mobility: Secondary | ICD-10-CM | POA: Diagnosis not present

## 2014-01-24 DIAGNOSIS — D649 Anemia, unspecified: Secondary | ICD-10-CM | POA: Diagnosis not present

## 2014-01-24 DIAGNOSIS — E1159 Type 2 diabetes mellitus with other circulatory complications: Secondary | ICD-10-CM | POA: Diagnosis not present

## 2014-01-24 DIAGNOSIS — I10 Essential (primary) hypertension: Secondary | ICD-10-CM | POA: Diagnosis not present

## 2014-01-25 DIAGNOSIS — I798 Other disorders of arteries, arterioles and capillaries in diseases classified elsewhere: Secondary | ICD-10-CM | POA: Diagnosis not present

## 2014-01-25 DIAGNOSIS — R269 Unspecified abnormalities of gait and mobility: Secondary | ICD-10-CM | POA: Diagnosis not present

## 2014-01-25 DIAGNOSIS — D649 Anemia, unspecified: Secondary | ICD-10-CM | POA: Diagnosis not present

## 2014-01-25 DIAGNOSIS — E1159 Type 2 diabetes mellitus with other circulatory complications: Secondary | ICD-10-CM | POA: Diagnosis not present

## 2014-01-25 DIAGNOSIS — H8110 Benign paroxysmal vertigo, unspecified ear: Secondary | ICD-10-CM | POA: Diagnosis not present

## 2014-01-25 DIAGNOSIS — I10 Essential (primary) hypertension: Secondary | ICD-10-CM | POA: Diagnosis not present

## 2014-01-30 DIAGNOSIS — D649 Anemia, unspecified: Secondary | ICD-10-CM | POA: Diagnosis not present

## 2014-01-30 DIAGNOSIS — R269 Unspecified abnormalities of gait and mobility: Secondary | ICD-10-CM | POA: Diagnosis not present

## 2014-01-30 DIAGNOSIS — I798 Other disorders of arteries, arterioles and capillaries in diseases classified elsewhere: Secondary | ICD-10-CM | POA: Diagnosis not present

## 2014-01-30 DIAGNOSIS — I10 Essential (primary) hypertension: Secondary | ICD-10-CM | POA: Diagnosis not present

## 2014-01-30 DIAGNOSIS — E1159 Type 2 diabetes mellitus with other circulatory complications: Secondary | ICD-10-CM | POA: Diagnosis not present

## 2014-01-30 DIAGNOSIS — H8110 Benign paroxysmal vertigo, unspecified ear: Secondary | ICD-10-CM | POA: Diagnosis not present

## 2014-01-31 DIAGNOSIS — I10 Essential (primary) hypertension: Secondary | ICD-10-CM | POA: Diagnosis not present

## 2014-01-31 DIAGNOSIS — D649 Anemia, unspecified: Secondary | ICD-10-CM | POA: Diagnosis not present

## 2014-01-31 DIAGNOSIS — I798 Other disorders of arteries, arterioles and capillaries in diseases classified elsewhere: Secondary | ICD-10-CM | POA: Diagnosis not present

## 2014-01-31 DIAGNOSIS — H8110 Benign paroxysmal vertigo, unspecified ear: Secondary | ICD-10-CM | POA: Diagnosis not present

## 2014-01-31 DIAGNOSIS — R269 Unspecified abnormalities of gait and mobility: Secondary | ICD-10-CM | POA: Diagnosis not present

## 2014-01-31 DIAGNOSIS — E1159 Type 2 diabetes mellitus with other circulatory complications: Secondary | ICD-10-CM | POA: Diagnosis not present

## 2014-02-04 DIAGNOSIS — E1159 Type 2 diabetes mellitus with other circulatory complications: Secondary | ICD-10-CM | POA: Diagnosis not present

## 2014-02-04 DIAGNOSIS — D649 Anemia, unspecified: Secondary | ICD-10-CM | POA: Diagnosis not present

## 2014-02-04 DIAGNOSIS — R269 Unspecified abnormalities of gait and mobility: Secondary | ICD-10-CM | POA: Diagnosis not present

## 2014-02-04 DIAGNOSIS — I10 Essential (primary) hypertension: Secondary | ICD-10-CM | POA: Diagnosis not present

## 2014-02-04 DIAGNOSIS — H8110 Benign paroxysmal vertigo, unspecified ear: Secondary | ICD-10-CM | POA: Diagnosis not present

## 2014-02-04 DIAGNOSIS — I798 Other disorders of arteries, arterioles and capillaries in diseases classified elsewhere: Secondary | ICD-10-CM | POA: Diagnosis not present

## 2014-02-12 DIAGNOSIS — E1159 Type 2 diabetes mellitus with other circulatory complications: Secondary | ICD-10-CM | POA: Diagnosis not present

## 2014-02-12 DIAGNOSIS — R269 Unspecified abnormalities of gait and mobility: Secondary | ICD-10-CM | POA: Diagnosis not present

## 2014-02-12 DIAGNOSIS — I798 Other disorders of arteries, arterioles and capillaries in diseases classified elsewhere: Secondary | ICD-10-CM | POA: Diagnosis not present

## 2014-02-12 DIAGNOSIS — I10 Essential (primary) hypertension: Secondary | ICD-10-CM | POA: Diagnosis not present

## 2014-02-12 DIAGNOSIS — D649 Anemia, unspecified: Secondary | ICD-10-CM | POA: Diagnosis not present

## 2014-02-12 DIAGNOSIS — H8110 Benign paroxysmal vertigo, unspecified ear: Secondary | ICD-10-CM | POA: Diagnosis not present

## 2014-02-14 DIAGNOSIS — E1159 Type 2 diabetes mellitus with other circulatory complications: Secondary | ICD-10-CM | POA: Diagnosis not present

## 2014-02-14 DIAGNOSIS — H8110 Benign paroxysmal vertigo, unspecified ear: Secondary | ICD-10-CM | POA: Diagnosis not present

## 2014-02-14 DIAGNOSIS — I10 Essential (primary) hypertension: Secondary | ICD-10-CM | POA: Diagnosis not present

## 2014-02-14 DIAGNOSIS — R269 Unspecified abnormalities of gait and mobility: Secondary | ICD-10-CM | POA: Diagnosis not present

## 2014-02-14 DIAGNOSIS — D649 Anemia, unspecified: Secondary | ICD-10-CM | POA: Diagnosis not present

## 2014-02-14 DIAGNOSIS — I798 Other disorders of arteries, arterioles and capillaries in diseases classified elsewhere: Secondary | ICD-10-CM | POA: Diagnosis not present

## 2014-02-19 DIAGNOSIS — I798 Other disorders of arteries, arterioles and capillaries in diseases classified elsewhere: Secondary | ICD-10-CM | POA: Diagnosis not present

## 2014-02-19 DIAGNOSIS — I10 Essential (primary) hypertension: Secondary | ICD-10-CM | POA: Diagnosis not present

## 2014-02-19 DIAGNOSIS — E1159 Type 2 diabetes mellitus with other circulatory complications: Secondary | ICD-10-CM | POA: Diagnosis not present

## 2014-02-19 DIAGNOSIS — D649 Anemia, unspecified: Secondary | ICD-10-CM | POA: Diagnosis not present

## 2014-02-19 DIAGNOSIS — H8110 Benign paroxysmal vertigo, unspecified ear: Secondary | ICD-10-CM | POA: Diagnosis not present

## 2014-02-19 DIAGNOSIS — R269 Unspecified abnormalities of gait and mobility: Secondary | ICD-10-CM | POA: Diagnosis not present

## 2014-02-20 DIAGNOSIS — E789 Disorder of lipoprotein metabolism, unspecified: Secondary | ICD-10-CM | POA: Diagnosis not present

## 2014-02-20 DIAGNOSIS — I10 Essential (primary) hypertension: Secondary | ICD-10-CM | POA: Diagnosis not present

## 2014-02-20 DIAGNOSIS — E119 Type 2 diabetes mellitus without complications: Secondary | ICD-10-CM | POA: Diagnosis not present

## 2014-02-20 DIAGNOSIS — R279 Unspecified lack of coordination: Secondary | ICD-10-CM | POA: Diagnosis not present

## 2014-02-21 DIAGNOSIS — I10 Essential (primary) hypertension: Secondary | ICD-10-CM | POA: Diagnosis not present

## 2014-02-21 DIAGNOSIS — R269 Unspecified abnormalities of gait and mobility: Secondary | ICD-10-CM | POA: Diagnosis not present

## 2014-02-21 DIAGNOSIS — I798 Other disorders of arteries, arterioles and capillaries in diseases classified elsewhere: Secondary | ICD-10-CM | POA: Diagnosis not present

## 2014-02-21 DIAGNOSIS — H8110 Benign paroxysmal vertigo, unspecified ear: Secondary | ICD-10-CM | POA: Diagnosis not present

## 2014-02-21 DIAGNOSIS — D649 Anemia, unspecified: Secondary | ICD-10-CM | POA: Diagnosis not present

## 2014-02-21 DIAGNOSIS — E1159 Type 2 diabetes mellitus with other circulatory complications: Secondary | ICD-10-CM | POA: Diagnosis not present

## 2014-03-07 ENCOUNTER — Ambulatory Visit (INDEPENDENT_AMBULATORY_CARE_PROVIDER_SITE_OTHER): Payer: Medicare Other | Admitting: Cardiology

## 2014-03-07 ENCOUNTER — Encounter: Payer: Self-pay | Admitting: Cardiology

## 2014-03-07 ENCOUNTER — Encounter: Payer: Self-pay | Admitting: Cardiovascular Disease

## 2014-03-07 VITALS — BP 142/90 | HR 88 | Ht 62.0 in | Wt 170.5 lb

## 2014-03-07 DIAGNOSIS — I2510 Atherosclerotic heart disease of native coronary artery without angina pectoris: Secondary | ICD-10-CM | POA: Diagnosis not present

## 2014-03-07 DIAGNOSIS — E785 Hyperlipidemia, unspecified: Secondary | ICD-10-CM

## 2014-03-07 DIAGNOSIS — I739 Peripheral vascular disease, unspecified: Secondary | ICD-10-CM | POA: Diagnosis not present

## 2014-03-07 DIAGNOSIS — I498 Other specified cardiac arrhythmias: Secondary | ICD-10-CM

## 2014-03-07 DIAGNOSIS — H814 Vertigo of central origin: Secondary | ICD-10-CM | POA: Diagnosis not present

## 2014-03-07 DIAGNOSIS — R001 Bradycardia, unspecified: Secondary | ICD-10-CM

## 2014-03-07 DIAGNOSIS — I10 Essential (primary) hypertension: Secondary | ICD-10-CM | POA: Diagnosis not present

## 2014-03-07 DIAGNOSIS — I6350 Cerebral infarction due to unspecified occlusion or stenosis of unspecified cerebral artery: Secondary | ICD-10-CM

## 2014-03-07 MED ORDER — AMLODIPINE BESYLATE 10 MG PO TABS: 10.0000 mg | ORAL_TABLET | Freq: Every day | ORAL | Status: DC

## 2014-03-07 NOTE — Patient Instructions (Signed)
Stop Valsartan   Start Amlodipine 10 mg  the first 6 days take 1/2 tablet then go to whole tablet daily.   Your physician wants you to follow-up in 2 months Dr Ellyn Hack. You will receive a reminder letter in the mail two months in advance. If you don't receive a letter, please call our office to schedule the follow-up appointment.

## 2014-03-10 ENCOUNTER — Encounter: Payer: Self-pay | Admitting: Cardiology

## 2014-03-10 NOTE — Assessment & Plan Note (Addendum)
Recently evaluated coronary anatomy demonstrated patent grafts, but extensive native disease.  She may very well have small vessel disease from her native left system. Therefore she may potentially have angina symptoms with a poorly controlled hypertension.  Needs aggressive blood pressure. Is already on aspirin plus Plavix as well as statin. Is not on a beta blocker, but is on nitrate.  Beta blocker was stopped during her hospitalization in January due to bradycardia. I am not sure why 100 mg twice a day metoprolol was stopped completely. This was not noted at the time of her visit because as much confusion about medicines she was on we can probably restart part of the beta blocker dose.

## 2014-03-10 NOTE — Assessment & Plan Note (Signed)
Unfortunately her beta blocker was totally stopped from 100 mg twice a day Will probably need to gradually add back beta blocker but not all the way up to 100 mg twice a day.

## 2014-03-10 NOTE — Assessment & Plan Note (Signed)
Unfortunately, it would seem that that her vertigo symptoms have worsened. I'm not sure how much S2 with her hypertension. She phasicity with her valsartan.  I spent close to 15 minutes counseling her she was already on valsartan and he asked for a combination making it highly unlikely that the valsartan as the cause of her symptoms.  Just to clarify issues. Valsartan and restart the amlodipine at 5 mg daily

## 2014-03-10 NOTE — Assessment & Plan Note (Signed)
Not as well-controlled today as would expect especially with diastolic dysfunction. Add back amlodipine exchanged for ARB. No further ACE inhibitor for fear of cough, although her cough has not gotten better on the ACE inhibitor and on ARB.

## 2014-03-10 NOTE — Progress Notes (Signed)
PCP: Dwan Bolt, MD  Clinic Note: Chief Complaint  Patient presents with  . Follow-up    dyspnea with activity, swelling in ankles on occasion   HPI: [name] is a 80 y.o. female with a Cardiovascular Problem List below who presents today for followup after her recent hospitalization significant Dizziness & Vertigo along with difficult to control BP.  She is a very pleasant 80 year old woman who is well known to me. I have referred her to Dr. Gwenlyn Found for PAD, she was then referred to Dr. Brunetta Jeans for completion of lower 70 revascularization intervention on the below the knee vessels on the left. She's been doing much better from an overall claudication standpoint, however came in December not feeling well, and noted some chest discomfort. She was referred for cardiac catheterization that revealed no significant changes to her coronary anatomy.  The procedure was aborted due to a potential contrast reaction.  Fortunately, during that initial admission for hydration preprocedure, her Exforge (valsartan/amlodipine 320/5 mg posterior was not ordered.  I had a initiating her on lisinopril on discharge. She says will he developed a cough on lisinopril and was put back on Diovan without amlodipine. She apparently then was hospitalized in January with what appeared to be central vertigo and difficult control hypertension. She has now developed a cough that seems related potentially to the lisinopril, and was put back on valsartan at one half of the original dose, now being 160 mg daily. Despite this, she continues to have cough and now feels like her balance is off and the whole vertebral situation is restarting.  Interval History: Overall,  from a cardiac standpoint, she is doing relatively since the catheterization.  No further chest tightness or pressure. She still gets some mild claudication symptoms but much improved from before. Otherwise denies any exertional dyspnea or chest  tightness/pressure. No PND, orthopnea or edema. No lightheadedness, dizziness, wooziness or syncope/near-syncope. No TIA or amaurosis fugax symptoms. No melena, hematochezia or hematuria. No nosebleeds.  Past Medical History  Diagnosis Date  . CAD (coronary artery disease) 1998    Hx CABG 1998, neg nuc 4/14; echo 02/22/11-EF 50-55%, mild pul htn, inf wall hypokinesis; nuc 04/03/13-no ischemia; Cath 11/2013 - Non-obstructive CAD  . Hypertension   . PAD (peripheral artery disease)     severe calcified pop and tibial peroneal trunk disease bil  . Hyperlipemia   . Chest pain   . DM (diabetes mellitus), type 2 with peripheral vascular complications     On oral medications  . GERD (gastroesophageal reflux disease)   . Claudication, lifestyle limiting 05/16/2013  . S/P angioplasty with stent, 05/15/13, Diamondback rot. atherectomy of high grade segmental popliteal stenosis then Chocolate balloonPTA; then angiosculpt PTA of the prox peroneal with stent-Xpedition  05/16/2013  . Bradycardia, mild briefly to the 40s, asymptomatic 05/16/2013  . Peptic ulcer disease   . Chronic anemia    Prior Cardiac Evaluation and Past Surgical History: Past Surgical History  Procedure Laterality Date  . Cardiac catheterization  07/17/01    2 v CAD with LM, circ, LAD, obtuse diag, mod stenosis of diag vein graft , mild stenosis of marg vein graft, nl EF, medical treatment  . Coronary angioplasty  1992    PCI to LAD  . Coronary artery bypass graft  1998    LIMA-LAD, SVG-diagonal (none roughly 50% stenosis), SVG-OM  . Stent to peoneal  05/15/13    Diamondback rot. atherectomy of high grade segmental popliteal stenosis then Chocolate  balloonPTA; then angiosculpt PTA of the prox peroneal with stent-Xpedition   . Pv angio  07/20/13    high-grade calcified popliteal disease with one-vessel runoff via a small anterior tibial artery that has proximal disease as well, no intervention  . Cardiac catheterization  11/2013    Widely  patent LIMA-LAD & SVG-OM with mild progression of proximal stenosis ~40-50% in SVG-D1; Widely patent native RCA with known severe native LCA disease  . Transthoracic echocardiogram  March 2012    Mild inferior-apical hypokinesis; EF 50-55%. Aortic sclerosis. PA pressure 30-40 mmHg.    Allergies  Allergen Reactions  . Fish Allergy Hives and Shortness Of Breath  . Iohexol Shortness Of Breath  . Ivp Dye [Iodinated Diagnostic Agents] Shortness Of Breath  . Latex Hives, Shortness Of Breath and Itching    REACTION: wheezing  . Shellfish Allergy Hives and Shortness Of Breath    All seafood  . Glucophage [Metformin Hcl] Other (See Comments)    Instructed by physician not to take anymore     MEDICATIONS REVIEWED IN EPIC  History   Social History Narrative    She is a married mother of 66, grandmother of 71, great grandmother of 45.    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    She does not really get a lot of exercise,    She does not smoke or drink EtOH.   ROS: A comprehensive Review of Systems - major notable noncardiac symptoms include headache, persistent cough and vertigo. Over last 4-5 days the vertigo has gotten worse, not to the extent that it was when she was in the hospital. She is very fearful that she would go back.  PHYSICAL EXAM BP 142/90  Pulse 88  Ht 5\' 2"  (1.575 m)  Wt 170 lb 8 oz (77.338 kg)  BMI 31.18 kg/m2 General appearance: alert, cooperative, appears stated age, no distress and mildly obese Neck: no adenopathy, no carotid bruit and no JVD Lungs: clear to auscultation bilaterally, normal percussion bilaterally and non-labored Heart: RRR, normal S1, S2, no murmur, click, rub or gallop; nondisplaced PMI Abdomen: soft, non-tender; bowel sounds normal; no masses,  no organomegaly; mild obesity Extremities: extremities normal, atraumatic, no cyanosis, or edema;There is some mild bruising on the left wrist. Pulses: 2+ and symmetric upper extremities.   Still has diminished pedal pulses, but stable.; Neurologic: Mental status: Alert, oriented, thought content appropriate; Cranial nerves: normal (II-XII grossly intact)  FTD:DUKGURKYH today: No  Recent Labs precath: None since January  ASSESSMENT / PLAN: Vertigo, central Unfortunately, it would seem that that her vertigo symptoms have worsened. I'm not sure how much S2 with her hypertension. She phasicity with her valsartan.  I spent close to 15 minutes counseling her she was already on valsartan and he asked for a combination making it highly unlikely that the valsartan as the cause of her symptoms.  Just to clarify issues. Valsartan and restart the amlodipine at 5 mg daily  HTN (hypertension) Not as well-controlled today as would expect especially with diastolic dysfunction. Add back amlodipine exchanged for ARB. No further ACE inhibitor for fear of cough, although her cough has not gotten better on the ACE inhibitor and on ARB.  CAD (coronary artery disease), hx of CABG in 1998, last nuc 03/2013 low risk study  Recently evaluated coronary anatomy demonstrated patent grafts, but extensive native disease.  She may very well have small vessel disease from her native left system. Therefore she may potentially have angina symptoms  with a poorly controlled hypertension.  Needs aggressive blood pressure. Is already on aspirin plus Plavix as well as statin. Is not on a beta blocker, but is on nitrate.  Beta blocker was stopped during her hospitalization in January due to bradycardia. I am not sure why 100 mg twice a day metoprolol was stopped completely. This was not noted at the time of her visit because as much confusion about medicines she was on we can probably restart part of the beta blocker dose.  Dyslipidemia On statin plus Zetia. Monitored by PCP but most recent check was very close to goal.  Bradycardia, mild briefly to the 40s, asymptomatic Unfortunately her beta blocker was totally  stopped from 100 mg twice a day Will probably need to gradually add back beta blocker but not all the way up to 100 mg twice a day.   No orders of the defined types were placed in this encounter.   Meds ordered this encounter  Medications  . DISCONTD: valsartan (DIOVAN) 160 MG tablet    Sig: Take 160 mg by mouth daily.  . pregabalin (LYRICA) 75 MG capsule    Sig: Take 75 mg by mouth at bedtime.  Marland Kitchen amLODipine (NORVASC) 10 MG tablet    Sig: Take 1 tablet (10 mg total) by mouth daily.    Dispense:  30 tablet    Refill:  11    Followup: 2 months  Givanni Staron W. Ellyn Hack, M.D., M.S. [facility] [address] [place], [place]  [address]  [phone] Pager # [phone]

## 2014-03-10 NOTE — Assessment & Plan Note (Signed)
On statin plus Zetia. Monitored by PCP but most recent check was very close to goal.

## 2014-03-20 DIAGNOSIS — R609 Edema, unspecified: Secondary | ICD-10-CM | POA: Diagnosis not present

## 2014-03-20 DIAGNOSIS — I739 Peripheral vascular disease, unspecified: Secondary | ICD-10-CM | POA: Diagnosis not present

## 2014-03-20 DIAGNOSIS — M79609 Pain in unspecified limb: Secondary | ICD-10-CM | POA: Diagnosis not present

## 2014-03-21 DIAGNOSIS — R609 Edema, unspecified: Secondary | ICD-10-CM | POA: Insufficient documentation

## 2014-03-29 ENCOUNTER — Ambulatory Visit: Payer: Self-pay

## 2014-04-01 ENCOUNTER — Ambulatory Visit (INDEPENDENT_AMBULATORY_CARE_PROVIDER_SITE_OTHER): Payer: Medicare Other

## 2014-04-01 VITALS — BP 178/69 | HR 82 | Resp 12

## 2014-04-01 DIAGNOSIS — G609 Hereditary and idiopathic neuropathy, unspecified: Secondary | ICD-10-CM | POA: Diagnosis not present

## 2014-04-01 DIAGNOSIS — S90129A Contusion of unspecified lesser toe(s) without damage to nail, initial encounter: Secondary | ICD-10-CM | POA: Diagnosis not present

## 2014-04-01 DIAGNOSIS — L600 Ingrowing nail: Secondary | ICD-10-CM

## 2014-04-01 DIAGNOSIS — I6350 Cerebral infarction due to unspecified occlusion or stenosis of unspecified cerebral artery: Secondary | ICD-10-CM | POA: Diagnosis not present

## 2014-04-01 DIAGNOSIS — G629 Polyneuropathy, unspecified: Secondary | ICD-10-CM

## 2014-04-01 NOTE — Patient Instructions (Signed)
ANTIBACTERIAL SOAP INSTRUCTIONS  THE DAY AFTER PROCEDURE  Please follow the instructions your doctor has marked.   Shower as usual. Before getting out, place a drop of antibacterial liquid soap (Dial) on a wet, clean washcloth.  Gently wipe washcloth over affected area.  Afterward, rinse the area with warm water.  Blot the area dry with a soft cloth and cover with antibiotic ointment (neosporin, polysporin, bacitracin) and band aid or gauze and tape  Place 3-4 drops of antibacterial liquid soap in a quart of warm tap water.  Submerge foot into water for 20 minutes.  If bandage was applied after your procedure, leave on to allow for easy lift off, then remove and continue with soak for the remaining time.  Next, blot area dry with a soft cloth and cover with a bandage.  Apply other medications as directed by your doctor, such as cortisporin otic solution (eardrops) or neosporin antibiotic ointment  Apply Neosporin or Neosporin with pain formula to the nail fold left great toe daily to help alleviate the scar tissue and inflammation

## 2014-04-01 NOTE — Progress Notes (Signed)
   Subjective:    Patient ID: [name], female    DOB: 1933/08/29, 80 y.o.   MRN: [id number]  HPI PT STATED LT FOOT GREAT TOENAIL IS SPLIT ON THE SIDE AND STILL SORE FOR 2 WEEKS. THE TOENAIL IS BEEN THE SAME NOT GETTING WORSE. TRIED TO PUT VITAMIN E OIL BUT IT DOES NOT HELP.     Review of Systems  All other systems reviewed and are negative.      Objective:   Physical Exam Program lower extremity objective findings as follows vascular status appears to be intact patient has had 2 stents in her left leg describes having some cramps at times feel cold there is some numbness and abnormal sensation 2 diabetes and history of standing. Hour pedal pulses are palpable dorsalis pedis pulse one over 4 bilateral PT plus one over 4 bilateral capillary refill timed 3-4 seconds all digits epicritic and proprioceptive sensations intact and symmetric bilateral is normal plantar response DTRs not listed neurologically skin color pigment normal wrist and proptosis incurvation medial nail fold left great toe possibly some dry eschar or eschar tissue cannot rule out a contusion to the nail there is a transverse fissure in the nail plate or crack in the nail causing irritation this is debrided away for the patient wearing some early suggested some Neosporin and daily cleansing with soap and water Neosporin application daily      Assessment & Plan:  Assessment cannot rule out early onychomycosis there is contusion of the toe with transverse fissuring of the nail this is treated debrided and cleansed. Recommend daily application some Neosporin and a keep the area protected possibly Neosporin pain formula with lidocaine as well if fails to improve with in one month followup as needed may be candidate for partial nail excision  Harriet Masson DPM

## 2014-04-16 ENCOUNTER — Encounter (HOSPITAL_COMMUNITY): Payer: Medicare Other

## 2014-04-22 ENCOUNTER — Encounter (HOSPITAL_COMMUNITY): Payer: Medicare Other

## 2014-04-24 ENCOUNTER — Telehealth: Payer: Self-pay | Admitting: Cardiology

## 2014-04-24 NOTE — Telephone Encounter (Signed)
RN spoke patient.  Patient states that her medication is causing a bad headache , dizziness and nauseated today. She states that headaches have been occurring since she started medication She states she had dental work 03/26/14 and she thought that was causing it,but she now think it is caused by amlodipine.  RN asked patient if this has been occurring since her start with medication in 02/2014.  Patient states the headache has been there since the beginning but not nausea or dizziness.  Patient states she has been taking the medication at bedtime, she did not start  with 1 tablet a day instead of 1/2 tablet for the first 6 days.   She states her blood pressure has been 172/100, 163/96 pulse -77. RN informed patient will make an appointment with Claiborne Billings. to discuss blood pressure next week (04/30/14 3 pm)., also will defer to Dr Ellyn Hack what to do?

## 2014-04-24 NOTE — Telephone Encounter (Signed)
Agree with plan to come in to see Erasmo Downer-- I cannot manage BP over telephone.  [name], MD

## 2014-04-24 NOTE — Telephone Encounter (Signed)
Dr Ellyn Hack recently changed her med amlodipine besylate 10 mg  She says she unable to take it . BP up, dizzy and not feeling well. Please call

## 2014-04-25 NOTE — Telephone Encounter (Signed)
RN spoke to patient,informed patient Dr Ellyn Hack would like for to keep appointment with Erasmo Downer.  Patient states she used Antivert after the conversation with RN yesterday. She states her nausea and dizziness ,headache is better. Patient verbalized understanding-- to bring medication and keep a log of blood pressure for upcoming appointment.

## 2014-04-29 ENCOUNTER — Ambulatory Visit (HOSPITAL_COMMUNITY)
Admission: RE | Admit: 2014-04-29 | Discharge: 2014-04-29 | Disposition: A | Discharge status: Home / Self Care | Payer: Medicare Other | Source: Ambulatory Visit | Attending: Cardiovascular Disease | Admitting: Cardiovascular Disease

## 2014-04-29 DIAGNOSIS — I739 Peripheral vascular disease, unspecified: Secondary | ICD-10-CM | POA: Insufficient documentation

## 2014-04-29 DIAGNOSIS — I70219 Atherosclerosis of native arteries of extremities with intermittent claudication, unspecified extremity: Secondary | ICD-10-CM | POA: Diagnosis not present

## 2014-04-29 NOTE — Progress Notes (Signed)
Arterial Duplex Lower Ext. Completed. Louie Meaders, BS, RDMS, RVT  

## 2014-04-30 ENCOUNTER — Ambulatory Visit (INDEPENDENT_AMBULATORY_CARE_PROVIDER_SITE_OTHER): Payer: Medicare Other | Admitting: Pharmacist Clinician (PhC)/ Clinical Pharmacy Specialist

## 2014-04-30 VITALS — BP 160/84 | HR 88 | Ht 63.0 in | Wt 173.4 lb

## 2014-04-30 DIAGNOSIS — I6350 Cerebral infarction due to unspecified occlusion or stenosis of unspecified cerebral artery: Secondary | ICD-10-CM

## 2014-04-30 DIAGNOSIS — I10 Essential (primary) hypertension: Secondary | ICD-10-CM

## 2014-04-30 NOTE — Patient Instructions (Signed)
Call with BP readings and how you're feeling within the next 2 weeks - [name] [phone] x [phone]  Your blood pressure today is 160/84  (your goal is to be <140/90)  Check your blood pressure at home daily (if able) and keep record of the readings. Make sure to hold the cuff up to your heart.  Take your BP meds as follows - restart the amlodipine at 2.5mg  (1/4 tablet) for 8 days, then if feeling good, increase to 5mg  (1/2 tablet)  Bring all of your meds, your BP cuff and your record of home blood pressures to your next appointment.  Exercise as you're able, try to walk approximately 30 minutes per day.  Keep salt intake to a minimum, especially watch canned and prepared boxed foods.  Eat more fresh fruits and vegetables and fewer canned items.  Avoid eating in fast food restaurants.    HOW TO TAKE YOUR BLOOD PRESSURE:   Rest 5 minutes before taking your blood pressure.    Don't smoke or drink caffeinated beverages for at least 30 minutes before.   Take your blood pressure before (not after) you eat.   Sit comfortably with your back supported and both feet on the floor (don't cross your legs).   Elevate your arm to heart level on a table or a desk.   Use the proper sized cuff. It should fit smoothly and snugly around your bare upper arm. There should be enough room to slip a fingertip under the cuff. The bottom edge of the cuff should be 1 inch above the crease of the elbow.   Ideally, take 3 measurements at one sitting and record the average.

## 2014-05-01 ENCOUNTER — Ambulatory Visit (INDEPENDENT_AMBULATORY_CARE_PROVIDER_SITE_OTHER): Payer: Medicare Other

## 2014-05-01 ENCOUNTER — Encounter: Payer: Self-pay | Admitting: Pharmacist Clinician (PhC)/ Clinical Pharmacy Specialist

## 2014-05-01 VITALS — BP 133/77 | HR 84 | Resp 18

## 2014-05-01 DIAGNOSIS — G609 Hereditary and idiopathic neuropathy, unspecified: Secondary | ICD-10-CM

## 2014-05-01 DIAGNOSIS — I6350 Cerebral infarction due to unspecified occlusion or stenosis of unspecified cerebral artery: Secondary | ICD-10-CM

## 2014-05-01 DIAGNOSIS — E1142 Type 2 diabetes mellitus with diabetic polyneuropathy: Secondary | ICD-10-CM

## 2014-05-01 DIAGNOSIS — G629 Polyneuropathy, unspecified: Secondary | ICD-10-CM

## 2014-05-01 DIAGNOSIS — E1149 Type 2 diabetes mellitus with other diabetic neurological complication: Secondary | ICD-10-CM | POA: Diagnosis not present

## 2014-05-01 DIAGNOSIS — S90129A Contusion of unspecified lesser toe(s) without damage to nail, initial encounter: Secondary | ICD-10-CM | POA: Diagnosis not present

## 2014-05-01 DIAGNOSIS — E1140 Type 2 diabetes mellitus with diabetic neuropathy, unspecified: Secondary | ICD-10-CM

## 2014-05-01 NOTE — Progress Notes (Signed)
   Subjective:    Patient ID: [name], female    DOB: June 09, 1933, 80 y.o.   MRN: [id number]  HPI my big toenail on the left that he worked on is doing good and I do have some cramping and I had done some metanax and I don't think it helped any and I did have a doppler and it was Monday 04/29/14 due to the stents in my legs    Review of Systems no new findings or systemic changes noted    Objective:   Physical Exam Neurovascular status is intact patient these have paresthesia bring your feet she has tried the next while back but couldn't find it improving quit at this time to ask questions about other alternatives including Lyrica I suggested she try the [place]ctor again may help her symptoms as far as her nail with contusion has resolved no signs of ingrown no infection no irritation the nails growing normally suggested followup palliative care in the future and as-needed basis pedal pulses are palpable DP and PT +2/4 bilateral Refill timed 3-4 seconds there are no other new changes noted patient is wearing some flimsy shoes that she are too tight narrow causing some compression the toes avoid advised to avoid the belly type slippers which or no support or structure make recommendation for good [employer] athletic or walking shoe with a thick soled       Assessment & Plan:  Assessment this time his diabetes with peripheral neuropathy and neuralgia suggest followup with her primary doctor who did suggest Metanx  to be retracted one more time if that fails may be candidate for gabapentin or Neurontin as trial reappointed future and as-needed basis there's any problems exacerbations of her nail or any continued difficulties with her peripheral neuropathy  Harriet Masson DPM

## 2014-05-01 NOTE — Progress Notes (Signed)
05/01/2014 [name] 1932-12-25 [id number]   HPI:  [name] is a 80 y.o. female patient of Dr Ellyn Hack, with a PMH below who presents today for a blood pressure check.  She has a somewhat confusing history of blood pressure management, with multiple medications having all been stopped or restarted at various times with little explanation.  She apparently was on Exforge 10/320 last summer, without problem, then at some point it was stopped and she was on lisinopril 2.5mg  at the first of 2015.  She was then on valsartan 160, which [name] stopped as the pt was coughing, although it is unsure if they were related events.  She was previously on metoprolol succinate 100mg  qd, but this was d/c'd in January due to some bradycardia while she was at [facility] for a spell of vertigo/dizziness and headaches.  She saw [name] on 3/29, this is when he switched her from valsartan to amlodipine 10mg .  This was done due to an apparent cough.  She called the office on 5/13, stating that she believed the amlodipine to be the cause of her ongoing headaches, dizziness and nausea.  She did admit to getting symptom relief with meclizine.  At that time she stopped the amlodipine, and although her BP is still elevated, she states she feels much better.   [name] does not use tobacco or alcohol and only drinks the occasional caffeine.  She does most of her own cooking, and adds no salt.  Her home life is hectic, in that she is the primary caregiver for her husband, she has no time for regular exercise.     Current Outpatient Prescriptions  Medication Sig Dispense Refill  . acetaminophen (TYLENOL) 500 MG tablet Take 500-1,000 mg by mouth 2 (two) times daily as needed for headache.      Marland Kitchen amLODipine (NORVASC) 10 MG tablet Take 1 tablet (10 mg total) by mouth daily.  30 tablet  11  . aspirin 81 MG EC tablet Take 1 tablet (81 mg total) by mouth daily.  30 tablet  0  . clopidogrel (PLAVIX) 75 MG tablet Take  75 mg by mouth daily.      . Coenzyme Q10 (CO Q 10 PO) Take 1 tablet by mouth daily.       Marland Kitchen estradiol (ESTRACE) 1 MG tablet Take 1 mg by mouth daily.      Marland Kitchen ezetimibe (ZETIA) 10 MG tablet Take 10 mg by mouth daily.      Marland Kitchen glimepiride (AMARYL) 4 MG tablet Take 4 mg by mouth daily before breakfast.      . isosorbide mononitrate (IMDUR) 30 MG 24 hr tablet Take 1 tablet (30 mg total) by mouth daily.  90 tablet  3  . l-methylfolate-B6-B12 (METANX) 3-35-2 MG TABS Take 1 tablet by mouth daily.      Marland Kitchen LORazepam (ATIVAN) 0.5 MG tablet Take 0.5 mg by mouth every 6 (six) hours as needed for anxiety.       . meclizine (ANTIVERT) 12.5 MG tablet Take 1 tablet (12.5 mg total) by mouth 2 (two) times daily as needed for dizziness.  30 tablet  0  . Multiple Vitamins-Minerals (CENTRUM SILVER PO) Take 1 tablet by mouth daily.      . nitroGLYCERIN (NITROSTAT) 0.4 MG SL tablet Place 0.4 mg under the tongue every 5 (five) minutes as needed for chest pain.      . pantoprazole (PROTONIX) 40 MG tablet TAKE 1 TABLET (40 MG  TOTAL) BY MOUTH DAILY.  30 tablet  2  . pregabalin (LYRICA) 75 MG capsule Take 75 mg by mouth at bedtime.      . rosuvastatin (CRESTOR) 20 MG tablet Take 20 mg by mouth every evening.       . saxagliptin HCl (ONGLYZA) 5 MG TABS tablet Take 5 mg by mouth daily.       No current facility-administered medications for this visit.    Allergies  Allergen Reactions  . Fish Allergy Hives and Shortness Of Breath  . Iohexol Shortness Of Breath  . Ivp Dye [Iodinated Diagnostic Agents] Shortness Of Breath  . Latex Hives, Shortness Of Breath and Itching    REACTION: wheezing  . Shellfish Allergy Hives and Shortness Of Breath    All seafood  . Shellfish-Derived Products Anaphylaxis and Hives  . Glucophage [Metformin Hcl] Other (See Comments)    Instructed by physician not to take anymore   . Metformin Nausea And Vomiting    Past Medical History  Diagnosis Date  . CAD (coronary artery disease) 1998     Hx CABG 1998, neg nuc 4/14; echo 02/22/11-EF 50-55%, mild pul htn, inf wall hypokinesis; nuc 04/03/13-no ischemia; Cath 11/2013 - Non-obstructive CAD  . Hypertension   . PAD (peripheral artery disease)     severe calcified pop and tibial peroneal trunk disease bil  . Hyperlipemia   . Chest pain   . DM (diabetes mellitus), type 2 with peripheral vascular complications     On oral medications  . GERD (gastroesophageal reflux disease)   . Claudication, lifestyle limiting 05/16/2013  . S/P angioplasty with stent, 05/15/13, Diamondback rot. atherectomy of high grade segmental popliteal stenosis then Chocolate balloonPTA; then angiosculpt PTA of the prox peroneal with stent-Xpedition  05/16/2013  . Bradycardia, mild briefly to the 40s, asymptomatic 05/16/2013  . Peptic ulcer disease   . Chronic anemia     Blood pressure 160/84, pulse 88, height 5\' 3"  (1.6 m), weight 173 lb 6.4 oz (78.654 kg). Standing 152/88   ASSESSMENT AND PLAN:  Today's blood pressure is without any medication, having stopped the amlodipine last week.  Apparently there was an ACEI related cough, I cannot determine whether it carried over to the valsartan, as she had been on this prior to lisinopril and then again aftert. But for now I am going to put her back on the amlodipine.  I have asked her to start at 2.5mg  for a week, then increase to 5mg  daily.  As she has 10mg  tablets, I cut one into 4ths for her to show her how to get 2.5mg .  She is to continue with home monitoring and call me in 2 weeks with her home blood pressure readings.  At that time I will determine when she needs to come back in for follow up.  We can always add low dose diuretic or try another ARB.  Tommy Medal PharmD CPP [facility]

## 2014-05-01 NOTE — Patient Instructions (Signed)
Diabetes and Foot Care Diabetes may cause you to have problems because of poor blood supply (circulation) to your feet and legs. This may cause the skin on your feet to become thinner, break easier, and heal more slowly. Your skin may become dry, and the skin may peel and crack. You may also have nerve damage in your legs and feet causing decreased feeling in them. You may not notice minor injuries to your feet that could lead to infections or more serious problems. Taking care of your feet is one of the most important things you can do for yourself.  HOME CARE INSTRUCTIONS  Wear shoes at all times, even in the house. Do not go barefoot. Bare feet are easily injured.  Check your feet daily for blisters, cuts, and redness. If you cannot see the bottom of your feet, use a mirror or ask someone for help.  Wash your feet with warm water (do not use hot water) and mild soap. Then pat your feet and the areas between your toes until they are completely dry. Do not soak your feet as this can dry your skin.  Apply a moisturizing lotion or petroleum jelly (that does not contain alcohol and is unscented) to the skin on your feet and to dry, brittle toenails. Do not apply lotion between your toes.  Trim your toenails straight across. Do not dig under them or around the cuticle. File the edges of your nails with an emery board or nail file.  Do not cut corns or calluses or try to remove them with medicine.  Wear clean socks or stockings every day. Make sure they are not too tight. Do not wear knee-high stockings since they may decrease blood flow to your legs.  Wear shoes that fit properly and have enough cushioning. To break in new shoes, wear them for just a few hours a day. This prevents you from injuring your feet. Always look in your shoes before you put them on to be sure there are no objects inside.  Do not cross your legs. This may decrease the blood flow to your feet.  If you find a minor scrape,  cut, or break in the skin on your feet, keep it and the skin around it clean and dry. These areas may be cleansed with mild soap and water. Do not cleanse the area with peroxide, alcohol, or iodine.  When you remove an adhesive bandage, be sure not to damage the skin around it.  If you have a wound, look at it several times a day to make sure it is healing.  Do not use heating pads or hot water bottles. They may burn your skin. If you have lost feeling in your feet or legs, you may not know it is happening until it is too late.  Make sure your health care provider performs a complete foot exam at least annually or more often if you have foot problems. Report any cuts, sores, or bruises to your health care provider immediately. SEEK MEDICAL CARE IF:   You have an injury that is not healing.  You have cuts or breaks in the skin.  You have an ingrown nail.  You notice redness on your legs or feet.  You feel burning or tingling in your legs or feet.  You have pain or cramps in your legs and feet.  Your legs or feet are numb.  Your feet always feel cold. SEEK IMMEDIATE MEDICAL CARE IF:   There is increasing redness,   swelling, or pain in or around a wound.  There is a red line that goes up your leg.  Pus is coming from a wound.  You develop a fever or as directed by your health care provider.  You notice a bad smell coming from an ulcer or wound. Document Released: 11/26/2000 Document Revised: 08/01/2013 Document Reviewed: 05/08/2013 [employer] Patient Information 2014 [employer].  

## 2014-05-07 ENCOUNTER — Encounter: Payer: Self-pay | Admitting: Cardiovascular Disease

## 2014-05-07 ENCOUNTER — Ambulatory Visit (INDEPENDENT_AMBULATORY_CARE_PROVIDER_SITE_OTHER): Payer: Medicare Other | Admitting: Cardiovascular Disease

## 2014-05-07 ENCOUNTER — Telehealth: Payer: Self-pay | Admitting: *Deleted

## 2014-05-07 VITALS — BP 132/76 | HR 84 | Ht 63.0 in | Wt 176.0 lb

## 2014-05-07 DIAGNOSIS — I739 Peripheral vascular disease, unspecified: Secondary | ICD-10-CM | POA: Diagnosis not present

## 2014-05-07 DIAGNOSIS — I6350 Cerebral infarction due to unspecified occlusion or stenosis of unspecified cerebral artery: Secondary | ICD-10-CM

## 2014-05-07 DIAGNOSIS — R0989 Other specified symptoms and signs involving the circulatory and respiratory systems: Secondary | ICD-10-CM

## 2014-05-07 NOTE — Progress Notes (Signed)
05/07/2014 [name]   1933/09/29  [id number]  Primary Physician Monica Bolt, MD Primary Cardiologist: Monica Harp MD [name]   HPI:  [name] is a 80 year old married African American female patient of [name]. She has known CAD status post coronary bypass grafting in the past (1998). She has preserved LV function and nonischemic Myoview. Because of claudication she underwent peripheral vascular angiography and other [name] 02/22/13 revealing significant popliteal and infrapopliteal disease. She has lifestyle limiting claudication.on 05/16/13 she underwent diamondback orbital rotational atherectomy, PTA of her distal right SFA and stenting using coronary drug-eluting stent for peroneal artery which was her only patent artery on the right. She does not notice any improvement and claudication since her procedure although she says her right leg his less "heavy" and that her left leg is much more symptomatic.I restudied her 07/20/13 with the intention of fixing her left leg however I decided to defer this because of anatomic complexity. Ultimately I  referred her to [name] in [place] who performed intervention on her left popliteal and tibioperoneal trunk on 10/01/13. Followup Dopplers performed 04/29/14 were excellent with ABIs of greater than 1 bilaterally, patent peroneal stent on the right a patent left popliteal. She really denies significant claudication and admits to marked improvement in her ability to ambulate.    Current Outpatient Prescriptions  Medication Sig Dispense Refill  . acetaminophen (TYLENOL) 500 MG tablet Take 500-1,000 mg by mouth 2 (two) times daily as needed for headache.      Marland Kitchen amLODipine (NORVASC) 10 MG tablet Take 1 tablet (10 mg total) by mouth daily.  30 tablet  11  . aspirin 81 MG EC tablet Take 1 tablet (81 mg total) by mouth daily.  30 tablet  0  . clopidogrel (PLAVIX) 75 MG tablet Take 75 mg by mouth daily.       . Coenzyme Q10 (CO Q 10 PO) Take 1 tablet by mouth daily.       Marland Kitchen estradiol (ESTRACE) 1 MG tablet Take 1 mg by mouth daily.      Marland Kitchen ezetimibe (ZETIA) 10 MG tablet Take 10 mg by mouth daily.      Marland Kitchen glimepiride (AMARYL) 4 MG tablet Take 4 mg by mouth daily before breakfast.      . isosorbide mononitrate (IMDUR) 30 MG 24 hr tablet Take 1 tablet (30 mg total) by mouth daily.  90 tablet  3  . l-methylfolate-B6-B12 (METANX) 3-35-2 MG TABS Take 1 tablet by mouth daily.      Marland Kitchen LORazepam (ATIVAN) 0.5 MG tablet Take 0.5 mg by mouth every 6 (six) hours as needed for anxiety.       . meclizine (ANTIVERT) 12.5 MG tablet Take 1 tablet (12.5 mg total) by mouth 2 (two) times daily as needed for dizziness.  30 tablet  0  . Multiple Vitamins-Minerals (CENTRUM SILVER PO) Take 1 tablet by mouth daily.      . nitroGLYCERIN (NITROSTAT) 0.4 MG SL tablet Place 0.4 mg under the tongue every 5 (five) minutes as needed for chest pain.      . pantoprazole (PROTONIX) 40 MG tablet TAKE 1 TABLET (40 MG TOTAL) BY MOUTH DAILY.  30 tablet  2  . pregabalin (LYRICA) 75 MG capsule Take 75 mg by mouth at bedtime.      . rosuvastatin (CRESTOR) 20 MG tablet Take 20 mg by mouth every evening.       . saxagliptin HCl (ONGLYZA) 5  MG TABS tablet Take 5 mg by mouth daily.       No current facility-administered medications for this visit.    Allergies  Allergen Reactions  . Fish Allergy Hives and Shortness Of Breath  . Iohexol Shortness Of Breath  . Ivp Dye [Iodinated Diagnostic Agents] Shortness Of Breath  . Latex Hives, Shortness Of Breath and Itching    REACTION: wheezing  . Shellfish Allergy Hives and Shortness Of Breath    All seafood  . Shellfish-Derived Products Anaphylaxis and Hives  . Glucophage [Metformin Hcl] Other (See Comments)    Instructed by physician not to take anymore   . Metformin Nausea And Vomiting    History   Social History  . Marital Status: Married    Spouse Name: N/A    Number of Children: N/A    . Years of Education: N/A   Occupational History  . Not on file.   Social History Main Topics  . Smoking status: Never Smoker   . Smokeless tobacco: Never Used  . Alcohol Use: No     Comment: rare glass of wine   . Drug Use: No  . Sexual Activity: Not on file   Other Topics Concern  . Not on file   Social History Narrative    She is a married mother of 2, grandmother of 35, great grandmother of 59.    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    She does not really get a lot of exercise,    She does not smoke or drink EtOH.     Review of Systems: General: negative for chills, fever, night sweats or weight changes.  Cardiovascular: negative for chest pain, dyspnea on exertion, edema, orthopnea, palpitations, paroxysmal nocturnal dyspnea or shortness of breath Dermatological: negative for rash Respiratory: negative for cough or wheezing Urologic: negative for hematuria Abdominal: negative for nausea, vomiting, diarrhea, bright red blood per rectum, melena, or hematemesis Neurologic: negative for visual changes, syncope, or dizziness All other systems reviewed and are otherwise negative except as noted above.    Blood pressure 132/76, pulse 84, height 5\' 3"  (1.6 m), weight 176 lb (79.833 kg).  General appearance: alert and no distress Neck: no adenopathy, no JVD, supple, symmetrical, trachea midline, thyroid not enlarged, symmetric, no tenderness/mass/nodules and soft left carotid bruit Lungs: clear to auscultation bilaterally Heart: regular rate and rhythm, S1, S2 normal, no murmur, click, rub or gallop Extremities: once pedal pulses bilaterally  EKG not performed today  ASSESSMENT AND PLAN:   Claudication, lifestyle limiting History of peripheral arterial disease status post right peroneal artery PTA and stenting by myself using a coronary diet drug-eluting stent (2.5 mm x 18 Ombres long) 05/15/13. She did have 75% segmental above-the-knee popliteal  stenosis as a cause occlusion of her anterior and posterior tibial arteries. I restarted her 07/20/13 revealing a highly complex calcified left popliteal artery stenosis which I ultimately referred her to [name] at [facility] for intervention which was done substantive at successfully. Her claudication is markedly improved. Her Doppler was performed 04/29/14 revealed ABIs of greater than 1 bilaterally. Her peroneal artery on the right is patent as is the popliteal artery on the left.      Monica Harp MD FACP,FACC,FAHA, [facility] 05/07/2014 11:37 AM

## 2014-05-07 NOTE — Telephone Encounter (Signed)
RN SPOKE TO PATIENT. RN SUGGEST TO CANCEL APPOINTMENT WITH DR Ellyn Hack 05/15/14. SINCE PATIENT IS BEING FOLLOWED BY KRISTIN  CONCERNING HER BLOOD PRESSURE AND PATIENT SAW DR BERRY TODAY 05/07/14 CONCERNING HER CLAUDICATION  Patient is in agreement.  Appointment is cancelled.  Will send message to Erasmo Downer -- place an appointment back in the system when she thinks patient should see Dr Ellyn Hack.

## 2014-05-07 NOTE — Assessment & Plan Note (Signed)
History of peripheral arterial disease status post right peroneal artery PTA and stenting by myself using a coronary diet drug-eluting stent (2.5 mm x 18 Ombres long) 05/15/13. She did have 75% segmental above-the-knee popliteal stenosis as a cause occlusion of her anterior and posterior tibial arteries. I restarted her 07/20/13 revealing a highly complex calcified left popliteal artery stenosis which I ultimately referred her to Dr. Brunetta Jeans at [facility] for intervention which was done substantive at successfully. Her claudication is markedly improved. Her Doppler was performed 04/29/14 revealed ABIs of greater than 1 bilaterally. Her peroneal artery on the right is patent as is the popliteal artery on the left.

## 2014-05-07 NOTE — Patient Instructions (Signed)
  We will see you back in follow up in 1 year with Dr Gwenlyn Found  Dr Gwenlyn Found has ordered : 1. lower extremity arterial doppler (every 6 months)- During this test, ultrasound is used to evaluate arterial blood flow in the legs. Allow approximately one hour for this exam.  2. Carotid Duplex- This test is an ultrasound of the carotid arteries in your neck. It looks at blood flow through these arteries that supply the brain with blood. Allow one hour for this exam. There are no restrictions or special instructions.

## 2014-05-14 NOTE — Telephone Encounter (Signed)
Makes sense.  Thanks,  [name], MD

## 2014-05-14 NOTE — Telephone Encounter (Signed)
LMOM for patient.  BP when she came in to see Dr. Gwenlyn Found was excellent.  Asked her to call if any concerns, otherwise continue current regimen.  Will reschedule her to see [name] in September, as last visit was in March, and Dr. Gwenlyn Found in May for claudication.  Will pass info on to scheduling desk.

## 2014-05-15 ENCOUNTER — Ambulatory Visit: Payer: Medicare Other | Admitting: Cardiology

## 2014-05-15 ENCOUNTER — Ambulatory Visit (HOSPITAL_COMMUNITY)
Admission: RE | Admit: 2014-05-15 | Discharge: 2014-05-15 | Disposition: A | Discharge status: Home / Self Care | Payer: Medicare Other | Source: Ambulatory Visit | Attending: Cardiovascular Disease | Admitting: Cardiovascular Disease

## 2014-05-15 ENCOUNTER — Inpatient Hospital Stay (HOSPITAL_COMMUNITY): Admission: RE | Admit: 2014-05-15 | Payer: Medicare Other | Source: Ambulatory Visit

## 2014-05-15 DIAGNOSIS — I669 Occlusion and stenosis of unspecified cerebral artery: Secondary | ICD-10-CM | POA: Diagnosis not present

## 2014-05-15 DIAGNOSIS — I739 Peripheral vascular disease, unspecified: Secondary | ICD-10-CM

## 2014-05-15 DIAGNOSIS — R0989 Other specified symptoms and signs involving the circulatory and respiratory systems: Secondary | ICD-10-CM

## 2014-05-15 NOTE — Progress Notes (Signed)
Carotid Duplex Completed. °Brianna L Mazza,RVT °

## 2014-05-16 ENCOUNTER — Telehealth: Payer: Self-pay | Admitting: Cardiology

## 2014-05-16 NOTE — Telephone Encounter (Signed)
Closed encounter °

## 2014-05-29 DIAGNOSIS — I10 Essential (primary) hypertension: Secondary | ICD-10-CM | POA: Diagnosis not present

## 2014-05-29 DIAGNOSIS — R42 Dizziness and giddiness: Secondary | ICD-10-CM | POA: Diagnosis not present

## 2014-05-31 DIAGNOSIS — E789 Disorder of lipoprotein metabolism, unspecified: Secondary | ICD-10-CM | POA: Diagnosis not present

## 2014-05-31 DIAGNOSIS — E119 Type 2 diabetes mellitus without complications: Secondary | ICD-10-CM | POA: Diagnosis not present

## 2014-06-17 ENCOUNTER — Encounter (HOSPITAL_COMMUNITY): Payer: Self-pay | Admitting: Emergency Medicine

## 2014-06-17 ENCOUNTER — Emergency Department (HOSPITAL_COMMUNITY)
Admission: EM | Admit: 2014-06-17 | Discharge: 2014-06-17 | Disposition: A | Discharge status: Home / Self Care | Payer: Medicare Other | Attending: Emergency Medicine | Admitting: Emergency Medicine

## 2014-06-17 DIAGNOSIS — Z7982 Long term (current) use of aspirin: Secondary | ICD-10-CM | POA: Insufficient documentation

## 2014-06-17 DIAGNOSIS — I2510 Atherosclerotic heart disease of native coronary artery without angina pectoris: Secondary | ICD-10-CM | POA: Insufficient documentation

## 2014-06-17 DIAGNOSIS — E1159 Type 2 diabetes mellitus with other circulatory complications: Secondary | ICD-10-CM | POA: Diagnosis not present

## 2014-06-17 DIAGNOSIS — Z862 Personal history of diseases of the blood and blood-forming organs and certain disorders involving the immune mechanism: Secondary | ICD-10-CM | POA: Insufficient documentation

## 2014-06-17 DIAGNOSIS — Z23 Encounter for immunization: Secondary | ICD-10-CM | POA: Insufficient documentation

## 2014-06-17 DIAGNOSIS — S61209A Unspecified open wound of unspecified finger without damage to nail, initial encounter: Secondary | ICD-10-CM | POA: Insufficient documentation

## 2014-06-17 DIAGNOSIS — I798 Other disorders of arteries, arterioles and capillaries in diseases classified elsewhere: Secondary | ICD-10-CM | POA: Diagnosis not present

## 2014-06-17 DIAGNOSIS — Z951 Presence of aortocoronary bypass graft: Secondary | ICD-10-CM | POA: Diagnosis not present

## 2014-06-17 DIAGNOSIS — I10 Essential (primary) hypertension: Secondary | ICD-10-CM | POA: Diagnosis not present

## 2014-06-17 DIAGNOSIS — Y9389 Activity, other specified: Secondary | ICD-10-CM | POA: Insufficient documentation

## 2014-06-17 DIAGNOSIS — E785 Hyperlipidemia, unspecified: Secondary | ICD-10-CM | POA: Insufficient documentation

## 2014-06-17 DIAGNOSIS — K219 Gastro-esophageal reflux disease without esophagitis: Secondary | ICD-10-CM | POA: Insufficient documentation

## 2014-06-17 DIAGNOSIS — Z79899 Other long term (current) drug therapy: Secondary | ICD-10-CM | POA: Insufficient documentation

## 2014-06-17 DIAGNOSIS — Z9889 Other specified postprocedural states: Secondary | ICD-10-CM | POA: Diagnosis not present

## 2014-06-17 DIAGNOSIS — Z91040 Latex allergy status: Secondary | ICD-10-CM | POA: Diagnosis not present

## 2014-06-17 DIAGNOSIS — W292XXA Contact with other powered household machinery, initial encounter: Secondary | ICD-10-CM | POA: Insufficient documentation

## 2014-06-17 DIAGNOSIS — Y929 Unspecified place or not applicable: Secondary | ICD-10-CM | POA: Insufficient documentation

## 2014-06-17 DIAGNOSIS — IMO0002 Reserved for concepts with insufficient information to code with codable children: Secondary | ICD-10-CM

## 2014-06-17 MED ORDER — TETANUS-DIPHTH-ACELL PERTUSSIS 5-2.5-18.5 LF-MCG/0.5 IM SUSP
0.5000 mL | Freq: Once | INTRAMUSCULAR | Status: AC
  Administered 2014-06-17: 0.5 mL via INTRAMUSCULAR
  Filled 2014-06-17: qty 0.5

## 2014-06-17 NOTE — ED Notes (Signed)
Pt in c/o laceration to right middle finger, happened while cutting up potatoes, wound with slow active bleed in triage, pressure dressing applied, pt takes plavix

## 2014-06-17 NOTE — ED Provider Notes (Signed)
CSN: [id number]     Arrival date & time 06/17/14  1813 History   This chart was scribed for non-physician practitioner, Monica Circle, PA-C working with Monica Rank, MD by [name], ED scribe. This patient was seen in room TR04C/TR04C and the patient's care was started at 7:13 PM.    Chief Complaint  Patient presents with  . Laceration   The history is provided by the patient. No language interpreter was used.   HPI Comments: [name] is a 80 y.o. female who presents to the Emergency Department with a chief complaint of a laceration to her right middle finger that occurred today approximately 1 hour ago. Pt states that she was taking the covering off a new knife when the bottom part of the knife cut the top part of her right middle finger. Bleeding is controlled. Pt states that she is right hand dominant. [name] reports taking Plavix daily. Denies any fever, chills, nausea, emesis, cough, SOB, chest pain, numbness, tingling, or diaphoresis.   PCP: Monica Bolt, MD  Past Medical History  Diagnosis Date  . CAD (coronary artery disease) 1998    Hx CABG 1998, neg nuc 4/14; echo 02/22/11-EF 50-55%, mild pul htn, inf wall hypokinesis; nuc 04/03/13-no ischemia; Cath 11/2013 - Non-obstructive CAD  . Hypertension   . PAD (peripheral artery disease)     severe calcified pop and tibial peroneal trunk disease bil  . Hyperlipemia   . Chest pain   . DM (diabetes mellitus), type 2 with peripheral vascular complications     On oral medications  . GERD (gastroesophageal reflux disease)   . Claudication, lifestyle limiting 05/16/2013  . S/P angioplasty with stent, 05/15/13, Diamondback rot. atherectomy of high grade segmental popliteal stenosis then Chocolate balloonPTA; then angiosculpt PTA of the prox peroneal with stent-Xpedition  05/16/2013  . Bradycardia, mild briefly to the 40s, asymptomatic 05/16/2013  . Peptic ulcer disease   . Chronic anemia    Past Surgical History  Procedure  Laterality Date  . Cardiac catheterization  07/17/01    2 v CAD with LM, circ, LAD, obtuse diag, mod stenosis of diag vein graft , mild stenosis of marg vein graft, nl EF, medical treatment  . Coronary angioplasty  1992    PCI to LAD  . Coronary artery bypass graft  1998    LIMA-LAD, SVG-diagonal (none roughly 50% stenosis), SVG-OM  . Tubal ligation    . Back surgery    . Stent to peoneal  05/15/13    Diamondback rot. atherectomy of high grade segmental popliteal stenosis then Chocolate balloonPTA; then angiosculpt PTA of the prox peroneal with stent-Xpedition   . Pv angio  07/20/13    high-grade calcified popliteal disease with one-vessel runoff via a small anterior tibial artery that has proximal disease as well, no intervention  . Cardiac catheterization  11/2013    Widely patent LIMA-LAD & SVG-OM with mild progression of proximal stenosis ~40-50% in SVG-D1; Widely patent native RCA with known severe native LCA disease  . Transthoracic echocardiogram  March 2012    Mild inferior-apical hypokinesis; EF 50-55%. Aortic sclerosis. PA pressure 30-40 mmHg.   Family History  Problem Relation Age of Onset  . Hypertension Mother   . Hyperlipidemia Mother   . Hyperlipidemia Father   . Hypertension Father    History  Substance Use Topics  . Smoking status: Never Smoker   . Smokeless tobacco: Never Used  . Alcohol Use: No     Comment: rare glass of  wine    OB History   Grav Para Term Preterm Abortions TAB SAB Ect Mult Living                 Review of Systems  Constitutional: Negative for fever and chills.  HENT: Negative for congestion.   Respiratory: Negative for cough and shortness of breath.   Cardiovascular: Negative for chest pain.  Gastrointestinal: Negative for nausea, vomiting and abdominal pain.  Skin: Positive for wound.  Neurological: Negative for numbness.   Allergies  Fish allergy; Iohexol; Ivp dye; Latex; Shellfish allergy; Shellfish-derived products; Glucophage; and  Metformin  Home Medications   Prior to Admission medications   Medication Sig Start Date End Date Taking? Authorizing Provider  acetaminophen (TYLENOL) 500 MG tablet Take 500-1,000 mg by mouth 2 (two) times daily as needed for headache.    Historical Provider, MD  amLODipine (NORVASC) 10 MG tablet Take 1 tablet (10 mg total) by mouth daily. 03/07/14   [name], MD  aspirin 81 MG EC tablet Take 1 tablet (81 mg total) by mouth daily. 05/16/13   Cecilie Kicks, NP  clopidogrel (PLAVIX) 75 MG tablet Take 75 mg by mouth daily.    Historical Provider, MD  Coenzyme Q10 (CO Q 10 PO) Take 1 tablet by mouth daily.     Historical Provider, MD  estradiol (ESTRACE) 1 MG tablet Take 1 mg by mouth daily.    Historical Provider, MD  ezetimibe (ZETIA) 10 MG tablet Take 10 mg by mouth daily.    Historical Provider, MD  glimepiride (AMARYL) 4 MG tablet Take 4 mg by mouth daily before breakfast.    Historical Provider, MD  isosorbide mononitrate (IMDUR) 30 MG 24 hr tablet Take 1 tablet (30 mg total) by mouth daily. 12/03/13   [name], MD  l-methylfolate-B6-B12 [facility]) 3-35-2 MG TABS Take 1 tablet by mouth daily.    Historical Provider, MD  LORazepam (ATIVAN) 0.5 MG tablet Take 0.5 mg by mouth every 6 (six) hours as needed for anxiety.     Historical Provider, MD  meclizine (ANTIVERT) 12.5 MG tablet Take 1 tablet (12.5 mg total) by mouth 2 (two) times daily as needed for dizziness. 01/04/14   Geradine Girt, DO  Multiple Vitamins-Minerals (CENTRUM SILVER PO) Take 1 tablet by mouth daily.    Historical Provider, MD  nitroGLYCERIN (NITROSTAT) 0.4 MG SL tablet Place 0.4 mg under the tongue every 5 (five) minutes as needed for chest pain.    Historical Provider, MD  pantoprazole (PROTONIX) 40 MG tablet TAKE 1 TABLET (40 MG TOTAL) BY MOUTH DAILY. 01/20/14   [name], MD  pregabalin (LYRICA) 75 MG capsule Take 75 mg by mouth at bedtime.    Historical Provider, MD  rosuvastatin (CRESTOR) 20 MG tablet Take  20 mg by mouth every evening.     Historical Provider, MD  saxagliptin HCl (ONGLYZA) 5 MG TABS tablet Take 5 mg by mouth daily.    Historical Provider, MD   BP 170/85  Pulse 95  Temp(Src) 98.3 F (36.8 C) (Oral)  Ht 5\' 3"  (1.6 m)  Wt 172 lb (78.019 kg)  BMI 30.48 kg/m2  SpO2 100%  Physical Exam  Nursing note and vitals reviewed. Constitutional: She is oriented to person, place, and time. She appears well-developed and well-nourished. No distress.  HENT:  Head: Normocephalic and atraumatic.  Eyes: Conjunctivae and EOM are normal.  Neck: Neck supple.  Cardiovascular: Normal rate.   Pulmonary/Chest: Effort normal. No respiratory distress.  Musculoskeletal: Normal range of motion.  Right hand: finger extension strength and ROM 5/5 at the DIP, PIP, and MCP.   Neurological: She is alert and oriented to person, place, and time.  Sensation intact.   Skin: Skin is warm and dry.  1.0 cm laceration over the dorsal aspect of the right middle finger near the DIP. No evidence of foreign bodies, no evidence of tendon or bone involvement.   Psychiatric: She has a normal mood and affect. Her behavior is normal.    ED Course  Procedures (including critical care time)  DIAGNOSTIC STUDIES: Oxygen Saturation is 100% on RA, normal by my interpretation.    COORDINATION OF CARE: 7:20 PM- Pt advised of plan for treatment and pt agrees. Will do a laceration repair. Advised pt to follow up with PCP do get her sutures removed in about 7 days.  LACERATION REPAIR Performed by: Monica Circle, PA-C Consent: Verbal consent obtained. Risks and benefits: risks, benefits and alternatives were discussed Patient identity confirmed: provided demographic data Time out performed prior to procedure Prepped and Draped in normal sterile fashion Wound explored Laceration Location: right middle finger Laceration Length: 1.0 cm No Foreign Bodies seen or palpated Anesthesia: local infiltration Local anesthetic:  lidocaine 2.0% without epinephrine Anesthetic total: 1.0 ml Irrigation method: syringe Amount of cleaning: standard Skin closure: 4-0 proline Number of sutures or staples: 3 Technique: interrupted  Patient tolerance: Patient tolerated the procedure well with no immediate complications.   MDM   Final diagnoses:  Laceration    Patient with laceration. Repaired in the ED. Tetanus updated. Followup with PCP in 7 days. Return given. Patient given a splint.  I personally performed the services described in this documentation, which was scribed in my presence. The recorded information has been reviewed and is accurate.    Monica Circle, PA-C 06/17/14 2020

## 2014-06-17 NOTE — ED Notes (Signed)
Pt friend, [name] is under emergency contacts and may be called for pickup

## 2014-06-17 NOTE — Discharge Instructions (Signed)

## 2014-06-18 NOTE — ED Provider Notes (Signed)
Medical screening examination/treatment/procedure(s) were performed by non-physician practitioner and as supervising physician I was immediately available for consultation/collaboration.    Dorie Rank, MD 06/18/14 [phone]

## 2014-06-24 DIAGNOSIS — E1149 Type 2 diabetes mellitus with other diabetic neurological complication: Secondary | ICD-10-CM | POA: Diagnosis not present

## 2014-08-20 ENCOUNTER — Emergency Department (HOSPITAL_COMMUNITY): Payer: Medicare Other

## 2014-08-20 ENCOUNTER — Emergency Department (HOSPITAL_COMMUNITY)
Admission: EM | Admit: 2014-08-20 | Discharge: 2014-08-20 | Disposition: A | Discharge status: Home / Self Care | Payer: Medicare Other | Attending: Emergency Medicine | Admitting: Emergency Medicine

## 2014-08-20 ENCOUNTER — Encounter (HOSPITAL_COMMUNITY): Payer: Self-pay | Admitting: Emergency Medicine

## 2014-08-20 DIAGNOSIS — E785 Hyperlipidemia, unspecified: Secondary | ICD-10-CM | POA: Diagnosis not present

## 2014-08-20 DIAGNOSIS — D649 Anemia, unspecified: Secondary | ICD-10-CM | POA: Insufficient documentation

## 2014-08-20 DIAGNOSIS — Z8719 Personal history of other diseases of the digestive system: Secondary | ICD-10-CM | POA: Insufficient documentation

## 2014-08-20 DIAGNOSIS — R6889 Other general symptoms and signs: Secondary | ICD-10-CM | POA: Insufficient documentation

## 2014-08-20 DIAGNOSIS — R42 Dizziness and giddiness: Secondary | ICD-10-CM | POA: Diagnosis not present

## 2014-08-20 DIAGNOSIS — Z91040 Latex allergy status: Secondary | ICD-10-CM | POA: Diagnosis not present

## 2014-08-20 DIAGNOSIS — Z79899 Other long term (current) drug therapy: Secondary | ICD-10-CM | POA: Diagnosis not present

## 2014-08-20 DIAGNOSIS — I798 Other disorders of arteries, arterioles and capillaries in diseases classified elsewhere: Secondary | ICD-10-CM | POA: Insufficient documentation

## 2014-08-20 DIAGNOSIS — Z7902 Long term (current) use of antithrombotics/antiplatelets: Secondary | ICD-10-CM | POA: Diagnosis not present

## 2014-08-20 DIAGNOSIS — Z7982 Long term (current) use of aspirin: Secondary | ICD-10-CM | POA: Diagnosis not present

## 2014-08-20 DIAGNOSIS — I498 Other specified cardiac arrhythmias: Secondary | ICD-10-CM | POA: Insufficient documentation

## 2014-08-20 DIAGNOSIS — E1159 Type 2 diabetes mellitus with other circulatory complications: Secondary | ICD-10-CM | POA: Insufficient documentation

## 2014-08-20 DIAGNOSIS — I10 Essential (primary) hypertension: Secondary | ICD-10-CM | POA: Diagnosis not present

## 2014-08-20 DIAGNOSIS — Z951 Presence of aortocoronary bypass graft: Secondary | ICD-10-CM | POA: Diagnosis not present

## 2014-08-20 DIAGNOSIS — I2510 Atherosclerotic heart disease of native coronary artery without angina pectoris: Secondary | ICD-10-CM | POA: Diagnosis not present

## 2014-08-20 LAB — DIFFERENTIAL
BASOS ABS: 0 10*3/uL (ref 0.0–0.1)
BASOS PCT: 0 % (ref 0–1)
Eosinophils Absolute: 0.2 10*3/uL (ref 0.0–0.7)
Eosinophils Relative: 5 % (ref 0–5)
Lymphocytes Relative: 27 % (ref 12–46)
Lymphs Abs: 1.4 10*3/uL (ref 0.7–4.0)
Monocytes Absolute: 0.5 10*3/uL (ref 0.1–1.0)
Monocytes Relative: 9 % (ref 3–12)
NEUTROS PCT: 59 % (ref 43–77)
Neutro Abs: 3 10*3/uL (ref 1.7–7.7)

## 2014-08-20 LAB — COMPREHENSIVE METABOLIC PANEL
ALBUMIN: 3.8 g/dL (ref 3.5–5.2)
ALK PHOS: 51 U/L (ref 39–117)
ALT: 15 U/L (ref 0–35)
AST: 20 U/L (ref 0–37)
Anion gap: 10 (ref 5–15)
BUN: 12 mg/dL (ref 6–23)
CHLORIDE: 97 meq/L (ref 96–112)
CO2: 29 mEq/L (ref 19–32)
Calcium: 9 mg/dL (ref 8.4–10.5)
Creatinine, Ser: 0.85 mg/dL (ref 0.50–1.10)
GFR calc Af Amer: 74 mL/min — ABNORMAL LOW (ref >90)
GFR calc non Af Amer: 64 mL/min — ABNORMAL LOW (ref >90)
Glucose, Bld: 133 mg/dL — ABNORMAL HIGH (ref 70–99)
POTASSIUM: 4.4 meq/L (ref 3.7–5.3)
Sodium: 136 mEq/L — ABNORMAL LOW (ref 137–147)
Total Bilirubin: 0.2 mg/dL — ABNORMAL LOW (ref 0.3–1.2)
Total Protein: 7.6 g/dL (ref 6.0–8.3)

## 2014-08-20 LAB — PROTIME-INR
INR: 1.02 (ref 0.00–1.49)
Prothrombin Time: 13.4 seconds (ref 11.6–15.2)

## 2014-08-20 LAB — CBC
HCT: 38.1 % (ref 36.0–46.0)
Hemoglobin: 12.1 g/dL (ref 12.0–15.0)
MCH: 26.9 pg (ref 26.0–34.0)
MCHC: 31.8 g/dL (ref 30.0–36.0)
MCV: 84.7 fL (ref 78.0–100.0)
PLATELETS: 290 10*3/uL (ref 150–400)
RBC: 4.5 MIL/uL (ref 3.87–5.11)
RDW: 13.5 % (ref 11.5–15.5)
WBC: 5.1 10*3/uL (ref 4.0–10.5)

## 2014-08-20 LAB — CBG MONITORING, ED: GLUCOSE-CAPILLARY: 127 mg/dL — AB (ref 70–99)

## 2014-08-20 LAB — I-STAT TROPONIN, ED: TROPONIN I, POC: 0 ng/mL (ref 0.00–0.08)

## 2014-08-20 LAB — APTT: APTT: 30 s (ref 24–37)

## 2014-08-20 MED ORDER — DIAZEPAM 5 MG PO TABS: 2.5000 mg | ORAL_TABLET | Freq: Four times a day (QID) | ORAL | Status: DC | PRN

## 2014-08-20 MED ORDER — ACETAMINOPHEN 500 MG PO TABS
1000.0000 mg | ORAL_TABLET | Freq: Once | ORAL | Status: AC
  Administered 2014-08-20: 1000 mg via ORAL
  Filled 2014-08-20: qty 2

## 2014-08-20 NOTE — ED Notes (Signed)
MD at bedside. 

## 2014-08-20 NOTE — ED Provider Notes (Signed)
CSN: [id number]     Arrival date & time 08/20/14  1839 History   First MD Initiated Contact with Patient 08/20/14 1920     Chief Complaint  Patient presents with  . Dizziness  . Stroke Symptoms     (Consider location/radiation/quality/duration/timing/severity/associated sxs/prior Treatment) HPI The patient reports that she's been dizzy all day. She reports the symptoms have been coming and going. She reports that sometimes it feels like she is leaning more to the left or having to compensate for feeling like she is drifting that way. There is a spinning quality to her dizziness when she goes from position changes. Symptoms are exacerbated also by turning her head. Patient does have a history of vertigo. She reports she tried her "vertigo" maneuvers. But she got no relief. She also takes meclizine twice a day. She was today just doesn't seem to be helping much. She does however note that at this point in time her symptoms do seem to be better. No associated visual changes. No associated general illness. No associated focal neurologic dysfunction in the form of focal weakness numbness or tingling.  Past Medical History  Diagnosis Date  . CAD (coronary artery disease) 1998    Hx CABG 1998, neg nuc 4/14; echo 02/22/11-EF 50-55%, mild pul htn, inf wall hypokinesis; nuc 04/03/13-no ischemia; Cath 11/2013 - Non-obstructive CAD  . Hypertension   . PAD (peripheral artery disease)     severe calcified pop and tibial peroneal trunk disease bil  . Hyperlipemia   . Chest pain   . DM (diabetes mellitus), type 2 with peripheral vascular complications     On oral medications  . GERD (gastroesophageal reflux disease)   . Claudication, lifestyle limiting 05/16/2013  . S/P angioplasty with stent, 05/15/13, Diamondback rot. atherectomy of high grade segmental popliteal stenosis then Chocolate balloonPTA; then angiosculpt PTA of the prox peroneal with stent-Xpedition  05/16/2013  . Bradycardia, mild briefly to the  40s, asymptomatic 05/16/2013  . Peptic ulcer disease   . Chronic anemia    Past Surgical History  Procedure Laterality Date  . Cardiac catheterization  07/17/01    2 v CAD with LM, circ, LAD, obtuse diag, mod stenosis of diag vein graft , mild stenosis of marg vein graft, nl EF, medical treatment  . Coronary angioplasty  1992    PCI to LAD  . Coronary artery bypass graft  1998    LIMA-LAD, SVG-diagonal (none roughly 50% stenosis), SVG-OM  . Tubal ligation    . Back surgery    . Stent to peoneal  05/15/13    Diamondback rot. atherectomy of high grade segmental popliteal stenosis then Chocolate balloonPTA; then angiosculpt PTA of the prox peroneal with stent-Xpedition   . Pv angio  07/20/13    high-grade calcified popliteal disease with one-vessel runoff via a small anterior tibial artery that has proximal disease as well, no intervention  . Cardiac catheterization  11/2013    Widely patent LIMA-LAD & SVG-OM with mild progression of proximal stenosis ~40-50% in SVG-D1; Widely patent native RCA with known severe native LCA disease  . Transthoracic echocardiogram  March 2012    Mild inferior-apical hypokinesis; EF 50-55%. Aortic sclerosis. PA pressure 30-40 mmHg.   Family History  Problem Relation Age of Onset  . Hypertension Mother   . Hyperlipidemia Mother   . Hyperlipidemia Father   . Hypertension Father    History  Substance Use Topics  . Smoking status: Never Smoker   . Smokeless tobacco: Never Used  .  Alcohol Use: No     Comment: rare glass of wine    OB History   Grav Para Term Preterm Abortions TAB SAB Ect Mult Living                 Review of Systems  10 Systems reviewed and are negative for acute change except as noted in the HPI.   Allergies  Fish allergy; Iohexol; Ivp dye; Latex; Shellfish allergy; Shellfish-derived products; Glucophage; and Metformin  Home Medications   Prior to Admission medications   Medication Sig Start Date End Date Taking? Authorizing  Provider  acetaminophen (TYLENOL) 500 MG tablet Take 1,000 mg by mouth every 6 (six) hours as needed.   Yes Historical Provider, MD  amLODipine (NORVASC) 10 MG tablet Take 2.5 mg by mouth daily.   Yes Historical Provider, MD  aspirin 81 MG tablet Take 81 mg by mouth every other day.   Yes Historical Provider, MD  clopidogrel (PLAVIX) 75 MG tablet Take 75 mg by mouth daily.   Yes Historical Provider, MD  Coenzyme Q10 (CO Q 10 PO) Take 1 tablet by mouth daily.    Yes Historical Provider, MD  estradiol (ESTRACE) 1 MG tablet Take 1 mg by mouth daily.   Yes Historical Provider, MD  ezetimibe (ZETIA) 10 MG tablet Take 10 mg by mouth daily.   Yes Historical Provider, MD  glimepiride (AMARYL) 4 MG tablet Take 4 mg by mouth daily before breakfast.   Yes Historical Provider, MD  isosorbide mononitrate (IMDUR) 30 MG 24 hr tablet Take 1 tablet (30 mg total) by mouth daily. 12/03/13  Yes [name], MD  l-methylfolate-B6-B12 [facility]) 3-35-2 MG TABS Take 1 tablet by mouth daily.   Yes Historical Provider, MD  LORazepam (ATIVAN) 0.5 MG tablet Take 0.5 mg by mouth every 6 (six) hours as needed for anxiety.    Yes Historical Provider, MD  meclizine (ANTIVERT) 12.5 MG tablet Take 1 tablet (12.5 mg total) by mouth 2 (two) times daily as needed for dizziness. 01/04/14  Yes Geradine Girt, DO  Multiple Vitamins-Minerals (CENTRUM SILVER PO) Take 1 tablet by mouth daily.   Yes Historical Provider, MD  pantoprazole (PROTONIX) 40 MG tablet TAKE 1 TABLET (40 MG TOTAL) BY MOUTH DAILY. 01/20/14  Yes [name], MD  pregabalin (LYRICA) 75 MG capsule Take 75 mg by mouth at bedtime.   Yes Historical Provider, MD  rosuvastatin (CRESTOR) 20 MG tablet Take 20 mg by mouth every evening.    Yes Historical Provider, MD  saxagliptin HCl (ONGLYZA) 5 MG TABS tablet Take 5 mg by mouth daily.   Yes Historical Provider, MD  diazepam (VALIUM) 5 MG tablet Take 0.5 tablets (2.5 mg total) by mouth every 6 (six) hours as needed for  anxiety (vertigo unrelieved by Antivert). 08/20/14   Charlesetta Shanks, MD  nitroGLYCERIN (NITROSTAT) 0.4 MG SL tablet Place 0.4 mg under the tongue every 5 (five) minutes as needed for chest pain.    Historical Provider, MD   BP 147/78  Pulse 77  Temp(Src) 98.7 F (37.1 C) (Oral)  Resp 17  SpO2 98% Physical Exam  Constitutional: She is oriented to person, place, and time. She appears well-developed and well-nourished.  HENT:  Head: Normocephalic and atraumatic.  Eyes: EOM are normal. Pupils are equal, round, and reactive to light.  Neck: Neck supple.  Cardiovascular: Normal rate, regular rhythm, normal heart sounds and intact distal pulses.   Pulmonary/Chest: Effort normal and breath sounds normal.  Abdominal: Soft.  Bowel sounds are normal. She exhibits no distension. There is no tenderness.  Musculoskeletal: Normal range of motion. She exhibits no edema.  Neurological: She is alert and oriented to person, place, and time. She has normal strength. No cranial nerve deficit. She exhibits normal muscle tone. Coordination normal. GCS eye subscore is 4. GCS verbal subscore is 5. GCS motor subscore is 6.  Patient is neurologically intact with coordinated purposeful movements. No cerebellar dysfunction. 5 out of 5 musculoskeletal strength upper and lower. Cranial nerves are intact. Mental status is clear with good cognitive function  Skin: Skin is warm, dry and intact.  Psychiatric: She has a normal mood and affect.    ED Course  Procedures (including critical care time) Labs Review Labs Reviewed  COMPREHENSIVE METABOLIC PANEL - Abnormal; Notable for the following:    Sodium 136 (*)    Glucose, Bld 133 (*)    Total Bilirubin 0.2 (*)    GFR calc non Af Amer 64 (*)    GFR calc Af Amer 74 (*)    All other components within normal limits  CBG MONITORING, ED - Abnormal; Notable for the following:    Glucose-Capillary 127 (*)    All other components within normal limits  PROTIME-INR  APTT   CBC  DIFFERENTIAL  I-STAT TROPOININ, ED    Imaging Review Ct Head (brain) Wo Contrast  08/20/2014   CLINICAL DATA:  Dizziness, stroke symptoms  EXAM: CT HEAD WITHOUT CONTRAST  TECHNIQUE: Contiguous axial images were obtained from the base of the skull through the vertex without intravenous contrast.  COMPARISON:  MRI air brain January 02, 2014, head CT January 02, 2014  FINDINGS: There is no midline shift, hydrocephalus, or mass. No acute hemorrhage or acute transcortical infarct is identified. There is mild bilateral periventricular white matter chronic small vessel ischemic change. The bony calvarium is intact. The visualized sinuses are clear.  IMPRESSION: No focal acute intracranial abnormality identified. Mild bilateral chronic periventricular white matter small vessel ischemic change.   Electronically Signed   By: Abelardo Diesel M.D.   On: 08/20/2014 20:47     EKG Interpretation None      MDM   Final diagnoses:  Vertigo   This patient presents with a prior history of vertigo. Today's symptoms do sound consistent with her vertiginous history. Diagnostic workup has been negative for any other etiology at this point. Currently I feel she is safe to discharge home. The patient will be advised to continue her meclizine and she will be given a 2.5 mg tab Valium dose to use as needed for exacerbation.    Charlesetta Shanks, MD 08/20/14 [phone]

## 2014-08-20 NOTE — ED Notes (Signed)
Pt wheeled out of ED via wheelchair, NAD 

## 2014-08-20 NOTE — ED Notes (Signed)
Assisted patient to the bathroom.  Ambulated from wheelchair to toilet without difficulty.

## 2014-08-20 NOTE — ED Notes (Signed)
Pt reports she went to bed last night at 1130pm, states she awoke at 0500 this morning with dizziness and numbness to the left side of mouth. Pt reports dizziness is constant, but waxes and wanes in intensity. Pt's neuro intact, no facial droop, clear speech, AO x 4.

## 2014-08-23 ENCOUNTER — Other Ambulatory Visit (HOSPITAL_COMMUNITY): Payer: Self-pay | Admitting: Cardiology

## 2014-08-23 NOTE — Telephone Encounter (Signed)
Rx refill sent to patient pharmacy   

## 2014-08-29 DIAGNOSIS — E789 Disorder of lipoprotein metabolism, unspecified: Secondary | ICD-10-CM | POA: Diagnosis not present

## 2014-08-29 DIAGNOSIS — E119 Type 2 diabetes mellitus without complications: Secondary | ICD-10-CM | POA: Diagnosis not present

## 2014-08-29 DIAGNOSIS — Z23 Encounter for immunization: Secondary | ICD-10-CM | POA: Diagnosis not present

## 2014-08-29 DIAGNOSIS — I10 Essential (primary) hypertension: Secondary | ICD-10-CM | POA: Diagnosis not present

## 2014-09-04 ENCOUNTER — Ambulatory Visit (INDEPENDENT_AMBULATORY_CARE_PROVIDER_SITE_OTHER): Payer: Medicare Other | Admitting: Cardiology

## 2014-09-04 ENCOUNTER — Encounter: Payer: Self-pay | Admitting: Cardiology

## 2014-09-04 VITALS — BP 142/80 | HR 78 | Ht 63.0 in | Wt 180.0 lb

## 2014-09-04 DIAGNOSIS — R079 Chest pain, unspecified: Secondary | ICD-10-CM | POA: Diagnosis not present

## 2014-09-04 DIAGNOSIS — I10 Essential (primary) hypertension: Secondary | ICD-10-CM | POA: Diagnosis not present

## 2014-09-04 DIAGNOSIS — I6350 Cerebral infarction due to unspecified occlusion or stenosis of unspecified cerebral artery: Secondary | ICD-10-CM

## 2014-09-04 DIAGNOSIS — I2510 Atherosclerotic heart disease of native coronary artery without angina pectoris: Secondary | ICD-10-CM | POA: Diagnosis not present

## 2014-09-04 DIAGNOSIS — I739 Peripheral vascular disease, unspecified: Secondary | ICD-10-CM

## 2014-09-04 DIAGNOSIS — E669 Obesity, unspecified: Secondary | ICD-10-CM

## 2014-09-04 DIAGNOSIS — E785 Hyperlipidemia, unspecified: Secondary | ICD-10-CM

## 2014-09-04 MED ORDER — CARVEDILOL 3.125 MG PO TABS: 3.1250 mg | ORAL_TABLET | Freq: Two times a day (BID) | ORAL | Status: DC

## 2014-09-04 NOTE — Progress Notes (Signed)
PCP: Dwan Bolt, MD  Clinic Note: Chief Complaint  Patient presents with  . 6 month visit    chest discomfort under l eft breast , used ntg x1 at separate times.no sob , no edema- cramps in left foot at times   HPI: [name] is a 80 y.o. female with a PMH below who presents today for a 51 month old CAD and PAD. Since I last saw her, she is seeing Dr. Quay Burow for her claudication. She had stenting of the right peroneal artery with a coronary DES 2.5 mm x 18 mm. She was also noted to have above-the-knee popliteal stenosis.  Past Medical History  Diagnosis Date  . CAD (coronary artery disease) 1998    Hx CABG 1998, neg nuc 4/14; echo 02/22/11-EF 50-55%, mild pul htn, inf wall hypokinesis; nuc 04/03/13-no ischemia; Cath 11/2013 - Non-obstructive CAD  . Hypertension   . PAD (peripheral artery disease)     severe calcified pop and tibial peroneal trunk disease bil  . Hyperlipemia   . Chest pain   . DM (diabetes mellitus), type 2 with peripheral vascular complications     On oral medications  . GERD (gastroesophageal reflux disease)   . Claudication, lifestyle limiting 05/16/2013  . S/P angioplasty with stent, 05/15/13, Diamondback rot. atherectomy of high grade segmental popliteal stenosis then Chocolate balloonPTA; then angiosculpt PTA of the prox peroneal with stent-Xpedition  05/16/2013  . Bradycardia, mild briefly to the 40s, asymptomatic 05/16/2013  . Peptic ulcer disease   . Chronic anemia     Prior Cardiac Evaluation and Past Surgical History: Past Surgical History  Procedure Laterality Date  . Cardiac catheterization  07/17/01    2 v CAD with LM, circ, LAD, obtuse diag, mod stenosis of diag vein graft , mild stenosis of marg vein graft, nl EF, medical treatment  . Coronary angioplasty  1992    PCI to LAD  . Coronary artery bypass graft  1998    LIMA-LAD, SVG-diagonal (none roughly 50% stenosis), SVG-OM  . Tubal ligation    . Back surgery    . Peroneal artery  stent  05/15/13    Diamondback rot. atherectomy of high grade segmental popliteal stenosis then Chocolate balloonPTA; then angiosculpt PTA of the prox peroneal with stent-Xpedition   . Pv angio  07/20/13    high-grade calcified popliteal disease with one-vessel runoff via a small anterior tibial artery that has proximal disease as well, no intervention  . Cardiac catheterization  11/2013    Widely patent LIMA-LAD & SVG-OM with mild progression of proximal stenosis ~40-50% in SVG-D1; Widely patent native RCA with known severe native LCA disease  . Transthoracic echocardiogram  March 2012    Mild inferior-apical hypokinesis; EF 50-55%. Aortic sclerosis. PA pressure 30-40 mmHg.   Interval History: She is a very pleasant woman who presents today relatively well. She's had a couple episodes of strange twinges in her chest one was a couple days ago along the left lower chest underneath her breasts. She took 3 nitroglycerin tablets and took one hour for it to help. For the most part she has these strange twinges that happen intermittently. He also does happen more with stress. The most recent episode was when she was abdomen dried onto the highway. Otherwise she does fine when she walks every day and does not have any chest tightness or pressure. She denies any exertional dyspnea. Mild lower extremity edema no PND, orthopnea. She does have some vertigo like dizziness that is  doing pretty well with meclizine. No palpitations, lightheadedness, dizziness, weakness or syncope/near syncope. No TIA/amaurosis fugax symptoms. No melena, hematochezia, hematuria, or epstaxis. No claudication.  ROS: A comprehensive Review of Systems - was performed Review of Systems  HENT: Negative for nosebleeds.   Respiratory: Negative for cough, hemoptysis, sputum production, shortness of breath and wheezing.   Cardiovascular: Positive for chest pain and palpitations.       Otherwise negative per history of present illness    Gastrointestinal: Negative for constipation, blood in stool and melena.  Genitourinary: Negative for hematuria.  Neurological: Positive for dizziness.       Vertigo, no falls.  Endo/Heme/Allergies: Negative.   All other systems reviewed and are negative.  ALLERGIES REVIEWED IN EPIC -- no change  Current Outpatient Prescriptions on File Prior to Visit  Medication Sig Dispense Refill  . acetaminophen (TYLENOL) 500 MG tablet Take 1,000 mg by mouth every 6 (six) hours as needed.      Marland Kitchen amLODipine (NORVASC) 10 MG tablet Take 2.5 mg by mouth daily.      Marland Kitchen aspirin 81 MG tablet Take 81 mg by mouth every other day.      . clopidogrel (PLAVIX) 75 MG tablet Take 75 mg by mouth daily.      . Coenzyme Q10 (CO Q 10 PO) Take 1 tablet by mouth daily.       Marland Kitchen estradiol (ESTRACE) 1 MG tablet Take 1 mg by mouth daily.      Marland Kitchen ezetimibe (ZETIA) 10 MG tablet Take 10 mg by mouth daily.      Marland Kitchen glimepiride (AMARYL) 4 MG tablet Take 4 mg by mouth daily before breakfast.      . isosorbide mononitrate (IMDUR) 30 MG 24 hr tablet Take 1 tablet (30 mg total) by mouth daily.  90 tablet  3  . l-methylfolate-B6-B12 (METANX) 3-35-2 MG TABS Take 1 tablet by mouth daily.      Marland Kitchen LORazepam (ATIVAN) 0.5 MG tablet Take 0.5 mg by mouth every 6 (six) hours as needed for anxiety.       . meclizine (ANTIVERT) 12.5 MG tablet Take 1 tablet (12.5 mg total) by mouth 2 (two) times daily as needed for dizziness.  30 tablet  0  . Multiple Vitamins-Minerals (CENTRUM SILVER PO) Take 1 tablet by mouth daily.      . nitroGLYCERIN (NITROSTAT) 0.4 MG SL tablet Place 0.4 mg under the tongue every 5 (five) minutes as needed for chest pain.      . pantoprazole (PROTONIX) 40 MG tablet TAKE 1 TABLET BY MOUTH DAILY  30 tablet  5  . pregabalin (LYRICA) 75 MG capsule Take 75 mg by mouth at bedtime.      . rosuvastatin (CRESTOR) 20 MG tablet Take 20 mg by mouth every evening.       . saxagliptin HCl (ONGLYZA) 5 MG TABS tablet Take 5 mg by mouth daily.        No current facility-administered medications on file prior to visit.   SOCIAL AND FAMILY HISTORY REVIEWED IN EPIC -- no change  Wt Readings from Last 3 Encounters:  09/04/14 180 lb (81.647 kg)  06/17/14 172 lb (78.019 kg)  05/07/14 176 lb (79.833 kg)   PHYSICAL EXAM BP 142/80  Pulse 78  Ht 5\' 3"  (1.6 m)  Wt 180 lb (81.647 kg)  BMI 31.89 kg/m2 General appearance: alert, cooperative, appears stated age, no distress and mildly obese  Neck: no adenopathy, no carotid bruit and no JVD  Lungs:  clear to auscultation bilaterally, normal percussion bilaterally and non-labored  Heart: RRR, normal S1, S2, no murmur, click, rub or gallop; nondisplaced PMI  Abdomen: soft, non-tender; bowel sounds normal; no masses, no organomegaly; mild obesity  Extremities: extremities normal, atraumatic, no cyanosis, or edema;There is some mild bruising on the left wrist.  Pulses: 2+ and symmetric upper extremities. Still has diminished pedal pulses, but stable.;  Neurologic: Mental status: Alert, oriented, thought content appropriate; Cranial nerves: normal (II-XII grossly intact)   Adult ECG Report  Rate: 78 ;  Rhythm: normal sinus rhythm ; RSR', anteroseptal T wave inversions, cannot rule out ischemia.  Narrative Interpretation: No notable changes from past EKG  Recent Labs:  Not available at since December of last year   ASSESSMENT / PLAN: CAD (coronary artery disease), hx of CABG in 1998, last nuc 03/2013 low risk study  Relatively stable - atypical sounding chest discomfort with no real relief after 3 SL NTG is not very convincing for angina chest pain. If she has more the symptoms and is having take more nitroglycerin, we would increase her Imdur dose to 60 mg. She is on a calcium channel blocker as well as aspirin Plavix. Stop the aspirin. I am going to start carvedilol to 3.125 mg twice a day. She is on pravastatin.  PAD,severe calcified pop and tibial peroneal trunk disease  bilateraly Monitored by Dr. Gwenlyn Found. She was actually referred to Dr. Brunetta Jeans at [facility] in [place] in October of last year for left popliteal stent placement as well as TP trunk intervention. Her claudication symptoms seem to have improved dramatically.  Essential hypertension Blood pressure is just slightly above goal. For her being a cardiac patient she should be on beta blocker. I'll add carvedilol 3.125 mg twice a day  Obesity (BMI 30-39.9) She's actually gained some weight since her last visit. We talked about the importance of diet modification and exercise.  Dyslipidemia, goal LDL below 70 On statin plus Zetia. Again monitored by PCP. By last check was close to goal. She'll be rechecked soon.    Orders Placed This Encounter  Procedures  . EKG 12-Lead   Meds ordered this encounter  Medications  . carvedilol (COREG) 3.125 MG tablet    Sig: Take 1 tablet (3.125 mg total) by mouth 2 (two) times daily.    Dispense:  180 tablet    Refill:  3     Followup: 6 months    Monica,DAVID Flores, M.D., M.S. Interventional Cardiologist   Pager # [phone]

## 2014-09-04 NOTE — Patient Instructions (Signed)
Let the office know if chest discomfort increase ,may increase medications  Start COREG(CARVEDIOLOL) 3.125 MG TWICE A DAY.  Your physician wants you to follow-up in [place].  You will receive a reminder letter in the mail two months in advance. If you don't receive a letter, please call our office to schedule the follow-up appointment.

## 2014-09-06 ENCOUNTER — Encounter: Payer: Self-pay | Admitting: Cardiology

## 2014-09-06 DIAGNOSIS — E669 Obesity, unspecified: Secondary | ICD-10-CM | POA: Insufficient documentation

## 2014-09-06 NOTE — Assessment & Plan Note (Addendum)
Relatively stable - atypical sounding chest discomfort with no real relief after 3 SL NTG is not very convincing for angina chest pain. If she has more the symptoms and is having take more nitroglycerin, we would increase her Imdur dose to 60 mg. She is on a calcium channel blocker as well as aspirin Plavix. Stop the aspirin. I am going to start carvedilol to 3.125 mg twice a day. She is on pravastatin.

## 2014-09-06 NOTE — Assessment & Plan Note (Signed)
On statin plus Zetia. Again monitored by PCP. By last check was close to goal. She'll be rechecked soon.

## 2014-09-06 NOTE — Assessment & Plan Note (Signed)
Blood pressure is just slightly above goal. For her being a cardiac patient she should be on beta blocker. I'll add carvedilol 3.125 mg twice a day

## 2014-09-06 NOTE — Assessment & Plan Note (Signed)
She's actually gained some weight since her last visit. We talked about the importance of diet modification and exercise.

## 2014-09-06 NOTE — Assessment & Plan Note (Signed)
Monitored by Dr. Gwenlyn Found. She was actually referred to Dr. Brunetta Jeans at [facility] in [place] in October of last year for left popliteal stent placement as well as TP trunk intervention. Her claudication symptoms seem to have improved dramatically.

## 2014-09-11 ENCOUNTER — Ambulatory Visit: Payer: Medicare Other | Admitting: *Deleted

## 2014-09-24 DIAGNOSIS — E119 Type 2 diabetes mellitus without complications: Secondary | ICD-10-CM | POA: Diagnosis not present

## 2014-09-24 DIAGNOSIS — R609 Edema, unspecified: Secondary | ICD-10-CM | POA: Diagnosis not present

## 2014-09-24 DIAGNOSIS — E789 Disorder of lipoprotein metabolism, unspecified: Secondary | ICD-10-CM | POA: Diagnosis not present

## 2014-10-03 DIAGNOSIS — E789 Disorder of lipoprotein metabolism, unspecified: Secondary | ICD-10-CM | POA: Diagnosis not present

## 2014-10-07 ENCOUNTER — Telehealth: Payer: Self-pay | Admitting: Cardiovascular Disease

## 2014-10-07 NOTE — Telephone Encounter (Signed)
I called this pt. And discussed her being able to go to the [address] office tomorrow and she told that she would just need to know when to be there, Pt. To be place on the Flex schedule in the AM, Pt. Agreed with plan

## 2014-10-07 NOTE — Telephone Encounter (Signed)
Please place pt. On flex schedule tomorrow 10/08/14 for fluid in her legs. Pt will need to know when to be there

## 2014-10-07 NOTE — Telephone Encounter (Signed)
Pt called in stating that she has been having some extreme swelling in her legs for the past 3 wks. She stated that her PCP gave her a diaretic to reduce the swelling but it has not helped . She is in extreme pain and would like to be seen. Please call  Thanks

## 2014-10-08 ENCOUNTER — Ambulatory Visit: Payer: Medicare Other | Admitting: Nurse Practitioner

## 2014-10-08 ENCOUNTER — Encounter: Payer: Self-pay | Admitting: Nurse Practitioner

## 2014-10-09 DIAGNOSIS — R8299 Other abnormal findings in urine: Secondary | ICD-10-CM | POA: Diagnosis not present

## 2014-10-10 DIAGNOSIS — E119 Type 2 diabetes mellitus without complications: Secondary | ICD-10-CM | POA: Diagnosis not present

## 2014-10-10 DIAGNOSIS — I739 Peripheral vascular disease, unspecified: Secondary | ICD-10-CM | POA: Diagnosis not present

## 2014-10-10 DIAGNOSIS — E789 Disorder of lipoprotein metabolism, unspecified: Secondary | ICD-10-CM | POA: Diagnosis not present

## 2014-10-23 DIAGNOSIS — E118 Type 2 diabetes mellitus with unspecified complications: Secondary | ICD-10-CM | POA: Diagnosis not present

## 2014-10-23 DIAGNOSIS — E789 Disorder of lipoprotein metabolism, unspecified: Secondary | ICD-10-CM | POA: Diagnosis not present

## 2014-10-23 DIAGNOSIS — I10 Essential (primary) hypertension: Secondary | ICD-10-CM | POA: Diagnosis not present

## 2014-10-23 DIAGNOSIS — Z79899 Other long term (current) drug therapy: Secondary | ICD-10-CM | POA: Diagnosis not present

## 2014-10-24 DIAGNOSIS — E785 Hyperlipidemia, unspecified: Secondary | ICD-10-CM | POA: Diagnosis not present

## 2014-11-04 ENCOUNTER — Encounter: Payer: Medicare Other | Attending: Endocrinology | Admitting: *Deleted

## 2014-11-06 DIAGNOSIS — I10 Essential (primary) hypertension: Secondary | ICD-10-CM | POA: Diagnosis not present

## 2014-11-06 DIAGNOSIS — E118 Type 2 diabetes mellitus with unspecified complications: Secondary | ICD-10-CM | POA: Diagnosis not present

## 2014-11-06 DIAGNOSIS — E789 Disorder of lipoprotein metabolism, unspecified: Secondary | ICD-10-CM | POA: Diagnosis not present

## 2014-11-06 DIAGNOSIS — Z23 Encounter for immunization: Secondary | ICD-10-CM | POA: Diagnosis not present

## 2014-11-11 ENCOUNTER — Ambulatory Visit: Payer: Medicare Other | Admitting: *Deleted

## 2014-11-12 ENCOUNTER — Ambulatory Visit (INDEPENDENT_AMBULATORY_CARE_PROVIDER_SITE_OTHER): Payer: Medicare Other

## 2014-11-12 VITALS — BP 188/89 | HR 82 | Resp 14 | Ht 63.0 in | Wt 180.0 lb

## 2014-11-12 DIAGNOSIS — M79606 Pain in leg, unspecified: Secondary | ICD-10-CM

## 2014-11-12 DIAGNOSIS — E1140 Type 2 diabetes mellitus with diabetic neuropathy, unspecified: Secondary | ICD-10-CM

## 2014-11-12 DIAGNOSIS — M7750 Other enthesopathy of unspecified foot: Secondary | ICD-10-CM

## 2014-11-12 DIAGNOSIS — I739 Peripheral vascular disease, unspecified: Secondary | ICD-10-CM | POA: Diagnosis not present

## 2014-11-12 DIAGNOSIS — I639 Cerebral infarction, unspecified: Secondary | ICD-10-CM

## 2014-11-12 DIAGNOSIS — M722 Plantar fascial fibromatosis: Secondary | ICD-10-CM | POA: Diagnosis not present

## 2014-11-12 DIAGNOSIS — M778 Other enthesopathies, not elsewhere classified: Secondary | ICD-10-CM

## 2014-11-12 DIAGNOSIS — G629 Polyneuropathy, unspecified: Secondary | ICD-10-CM

## 2014-11-12 DIAGNOSIS — M779 Enthesopathy, unspecified: Secondary | ICD-10-CM

## 2014-11-12 NOTE — Patient Instructions (Signed)
ICE INSTRUCTIONS  Apply ice or cold pack to the affected area at least 3 times a day for 10-15 minutes each time.  You should also use ice after prolonged activity or vigorous exercise.  Do not apply ice longer than 20 minutes at one time.  Always keep a cloth between your skin and the ice pack to prevent burns.  Being consistent and following these instructions will help control your symptoms.  We suggest you purchase a gel ice pack because they are reusable and do bit leak.  Some of them are designed to wrap around the area.  Use the method that works best for you.  Here are some other suggestions for icing.   Use a frozen bag of peas or corn-inexpensive and molds well to your body, usually stays frozen for 10 to 20 minutes.  Wet a towel with cold water and squeeze out the excess until it's damp.  Place in a bag in the freezer for 20 minutes. Then remove and use.  Apply ice to the heels every evening for 10 or 15 minutes. For pain can use plain Tylenol avoid Aleve or aspirin products

## 2014-11-12 NOTE — Progress Notes (Signed)
   Subjective:    Patient ID: [name], female    DOB: February 17, 1933, 80 y.o.   MRN: [id number]  HPI Comments: Pt states she has cramping in both arches, when pointing toes down to drive or put on shoes, for over 1 year.  Foot Pain      Review of Systems  All other systems reviewed and are negative.      Objective:   Physical Exam 80 year old F connecting female well-developed well-nourished presents at this time with some new issues she is having some cramping in both feet can't walk more than a block without and take a break and having pain in the arch areas of both feet both on palpation ambulation or walking or standing feet get cold and tingling at times has had diabetes with neuropathy however seems to be getting progressively worse patient had a history of coronary bypass on exam at this time lower extremities reveal thready to nonpalpable pulses DP thready at best +1 PT nonpalpable bilateral +1 edema noted bilateral temperature warm to cool turgor diminished rubor no rubor pallor or varicosities noted. Neurologically epicritic and proprioceptive sensations intact although decreased in sensation of the forefoot digits and arch. There is normal plantar response DTRs not elicited. Dermatologically skin color pigment normal hair growth absent nail somewhat criptotic and otherwise unremarkable orthopedic biomechanical exam rectus foot type with promontory changes x-rays revealed some subluxation Lisfranc for fifth metatarsal base and cuboid mild fascial thickening mild inferior retrocalcaneal spurring noted no fractures cyst or tumors were otherwise identified mild digital contractures are confirmed.       Assessment & Plan:  Assessment plantar fasciitis O'Shields procedure with capsulitis mid foot or Lisfranc's bilateral plantar this time fascial strapping applied to both feet also concerned about a vascular status and patient talking about cramping when she walks or sits for any period  of time or elevates her feet may have mild intermittent claudication vascular compromise with weakened nonpalpable pulses we'll recommend a lower extremity arterial Doppler study evaluation with ABI and TBI's. Follow-up in 2 weeks for reevaluation may be candidate for orthoses her diabetic shoes with orthotic insoles in the future as recommended Tylenol for pain ice to the heels in the evenings maintain fascial strapping for 5 days as instructed next para Claudette Stapler DPM

## 2014-11-12 NOTE — Addendum Note (Signed)
Addended by: Harriett Sine D on: 11/12/2014 05:25 PM   Modules accepted: Orders

## 2014-11-18 ENCOUNTER — Telehealth (HOSPITAL_COMMUNITY): Payer: Self-pay | Admitting: *Deleted

## 2014-11-21 ENCOUNTER — Encounter (HOSPITAL_COMMUNITY): Payer: Self-pay | Admitting: Cardiovascular Disease

## 2014-11-21 ENCOUNTER — Ambulatory Visit (HOSPITAL_COMMUNITY)
Admission: RE | Admit: 2014-11-21 | Discharge: 2014-11-21 | Disposition: A | Discharge status: Home / Self Care | Payer: Medicare Other | Source: Ambulatory Visit | Attending: Cardiovascular Disease | Admitting: Cardiovascular Disease

## 2014-11-21 DIAGNOSIS — I739 Peripheral vascular disease, unspecified: Secondary | ICD-10-CM

## 2014-11-21 DIAGNOSIS — N390 Urinary tract infection, site not specified: Secondary | ICD-10-CM | POA: Diagnosis not present

## 2014-11-21 DIAGNOSIS — M79606 Pain in leg, unspecified: Secondary | ICD-10-CM

## 2014-11-21 NOTE — Progress Notes (Signed)
Arterial Duplex Lower Ext. Completed. Bilateral ABIs -Mild arterial insufficiency at rest. The right ABI 0.82 and the left ABI 0.91. Bilateral TBIs was abnormal.   Oda Cogan, BS, RDMS, RVT

## 2014-11-27 DIAGNOSIS — G43909 Migraine, unspecified, not intractable, without status migrainosus: Secondary | ICD-10-CM | POA: Diagnosis not present

## 2014-12-03 ENCOUNTER — Ambulatory Visit: Payer: 59

## 2014-12-04 ENCOUNTER — Encounter: Payer: Medicare Other | Attending: Endocrinology | Admitting: *Deleted

## 2014-12-04 DIAGNOSIS — E119 Type 2 diabetes mellitus without complications: Secondary | ICD-10-CM | POA: Diagnosis not present

## 2014-12-04 DIAGNOSIS — Z713 Dietary counseling and surveillance: Secondary | ICD-10-CM | POA: Insufficient documentation

## 2014-12-04 NOTE — Progress Notes (Signed)
Diabetes Self-Management Education   Appt. Start Time: 1400 Appt. End Time: 5885  12/04/2014  [name], identified by name and date of birth, is a 80 y.o. female with a diagnosis of Diabetes: Type 2.  Other people present during visit:  Spouse/SO (Husband [name]) Ms [name] noted her primary concern was dietary in nature. She has not been testing her glucose for years. She obtained and new glucometer and plans to go to the pharmacy for them to show her how to use it. She did not bring it with her today.  ASSESSMENT  There were no vitals taken for this visit. There is no weight on file to calculate BMI.  Initial Visit Information:  Are you currently following a meal plan?: No (Aware of what she eats, avoids fried foods)  Are you taking your medications as prescribed?: Yes Are you checking your feet?: No (Goes to podiatrist)  How often do you need to have someone help you when you read instructions, pamphlets, or other written materials from your doctor or pharmacy?: 1 - Never   Psychosocial:   Patient Belief/Attitude about Diabetes: Motivated to manage diabetes Self-management support: Doctor's office, CDE visits Other persons present: Spouse/SO (Husband [name]) Patient Concerns: Nutrition/Meal planning, Glycemic Control Special Needs: None, Verbal instruction Preferred Learning Style: Auditory (Does not like to read) Learning Readiness: Change in progress  Complications:   O2D 7.4% 10/2014 How often do you check your blood sugar?: 0 times/day (not testing) Have you had a dilated eye exam in the past 12 months?: Yes Have you had a dental exam in the past 12 months?: Yes  Diet Intake:  Breakfast: boiled egg, grits, pancakes, fruit, Neese Sausage patties Snack (morning): Yoplait  [place] yogurt Lunch: [place] (lunchmeat) mustard, White bread Snack (afternoon): peanut butter crackers Dinner: baked sweet potatoe, greens, pinto beans, pork barbeque, fried chicken Beverage(s):  apple cider tea, water, regular Coke  Exercise:  Exercise: Light (walking / raking leaves) (presently walks up and down driveway. Plans on starting to go to [facility] to walk)  Individualized Plan for Diabetes Self-Management Training:   Learning Objective:  Patient will have a greater understanding of diabetes self-management. Patient education plan per assessed needs and concerns is to attend individual sessions     Education Topics Reviewed with Patient Today:  Definition of diabetes, type 1 and 2, and the diagnosis of diabetes, Factors that contribute to the development of diabetes Role of diet in the treatment of diabetes and the relationship between the three main macronutrients and blood glucose level, Carbohydrate counting, Information on hints to eating out and maintain blood glucose control., Meal options for control of blood glucose level and chronic complications. Role of exercise on diabetes management, blood pressure control and cardiac health., Helped patient identify appropriate exercises in relation to his/her diabetes, diabetes complications and other health issue.  Purpose and frequency of SMBG., Identified appropriate SMBG and/or A1C goals. Taught treatment of hypoglycemia - the 15 rule. Relationship between chronic complications and blood glucose control, Assessed and discussed foot care and prevention of foot problems Helped patient develop diabetes management plan for (enter comment)  PATIENTS GOALS/Plan (Developed by the patient):  Nutrition: General guidelines for healthy choices and portions discussed Physical Activity: Exercise 3-5 times per week, 30 minutes per day Medications: take my medication as prescribed Monitoring : test blood glucose pre and post meals as discussed Reducing Risk: treat hypoglycemia with 15 grams of carbs if blood glucose less than 70mg /dL, do foot checks daily  Patient  Instructions  Plan:  Aim for 3 Carb Choices per meal (45 grams) +/- 1  either way  Aim for 0-15 Carbs per snack if hungry  Include protein in moderation with your meals and snacks Consider reading food labels for Total Carbohydrate and Fat Grams of foods Consider  increasing your activity level by walking for 30 minutes daily as tolerated Consider checking BG at alternate times per day to include first thing in the morning and two hours after evening meal  Continue taking medication as directed by MD  Try Special K Protein Cereal Consider low fat/low calorie Greek Yogurt Consider Brummel and [employer] Substitute Try to remove the skin from your chicken....this is all fat  ALWAYS HAVE PROTEIN AND CARBOHYDRATES TOGETHER   Expected Outcomes:  Demonstrated interest in learning. Expect positive outcomes  Education material provided: Living Well with Diabetes, A1C conversion sheet, Meal plan card, My Plate and Snack sheet  If problems or questions, patient to contact team via:  Phone  Future DSME appointment: 4-6 wks  To review glucose readings and dietary intake.

## 2014-12-04 NOTE — Patient Instructions (Addendum)
Plan:  Aim for 3 Carb Choices per meal (45 grams) +/- 1 either way  Aim for 0-15 Carbs per snack if hungry  Include protein in moderation with your meals and snacks Consider reading food labels for Total Carbohydrate and Fat Grams of foods Consider  increasing your activity level by walking for 30 minutes daily as tolerated Consider checking BG at alternate times per day to include first thing in the morning and two hours after evening meal  Continue taking medication as directed by MD  Try Special K Protein Cereal Consider low fat/low calorie Greek Yogurt Consider Brummel and [employer] Substitute Try to remove the skin from your chicken....this is all fat  ALWAYS HAVE PROTEIN AND CARBOHYDRATES TOGETHER

## 2014-12-12 DIAGNOSIS — E282 Polycystic ovarian syndrome: Secondary | ICD-10-CM | POA: Diagnosis not present

## 2014-12-16 DIAGNOSIS — L299 Pruritus, unspecified: Secondary | ICD-10-CM | POA: Diagnosis not present

## 2014-12-17 ENCOUNTER — Ambulatory Visit: Payer: Medicare Other

## 2014-12-23 DIAGNOSIS — B373 Candidiasis of vulva and vagina: Secondary | ICD-10-CM | POA: Diagnosis not present

## 2015-01-08 ENCOUNTER — Ambulatory Visit: Payer: Medicare Other | Admitting: *Deleted

## 2015-01-08 DIAGNOSIS — B86 Scabies: Secondary | ICD-10-CM | POA: Diagnosis not present

## 2015-01-10 DIAGNOSIS — E118 Type 2 diabetes mellitus with unspecified complications: Secondary | ICD-10-CM | POA: Diagnosis not present

## 2015-01-10 DIAGNOSIS — L309 Dermatitis, unspecified: Secondary | ICD-10-CM | POA: Diagnosis not present

## 2015-01-10 DIAGNOSIS — E789 Disorder of lipoprotein metabolism, unspecified: Secondary | ICD-10-CM | POA: Diagnosis not present

## 2015-01-15 ENCOUNTER — Encounter: Payer: Self-pay | Admitting: *Deleted

## 2015-01-15 ENCOUNTER — Encounter: Payer: Medicare Other | Attending: Endocrinology | Admitting: *Deleted

## 2015-01-15 DIAGNOSIS — Z713 Dietary counseling and surveillance: Secondary | ICD-10-CM | POA: Insufficient documentation

## 2015-01-15 DIAGNOSIS — E119 Type 2 diabetes mellitus without complications: Secondary | ICD-10-CM

## 2015-01-15 NOTE — Progress Notes (Signed)
Patient returns for follow up r/t T2DM. She is accompanied by her husband. We reviewed the following topics;  Nutrition:    Breakfast:  Egg, cheese, special K protein cereal, slice of bread    Lunch:   Chicken salad, 6 crackers, water / egg salad & #5 crackers, apple    Snack: 2 ginger snaps, slice of bread with peanut butter    Dinner:  3oz meat, 2 servings carbs, vegetables, water  Medication: taking regularly  Glucose testing:  Has new glucometer, is uncertain how to use, wants someone to show her. Did not bring it with her.     This date 2hpp 169mg /dl  Activity: Limited Walking  Conclusion: [name] is making good food choices and practicing portion control.  Plan: Add lettuce and vegetables to support your meals to feel more full Add a protein along with your gingersnaps  Increase walking  Begin testing blood sugar at least first in the morning  Return as needed.

## 2015-01-15 NOTE — Patient Instructions (Addendum)
Plan: Add lettuce and vegetables to support your meals to feel more full Add a protein along with your gingersnaps  Increase walking  Reduced calorie [place] Yogurt is good for a snack!  Test sugar in the morning before breakfast on one day The next day do it 2 hours after a meal Stevia is OK to use  Your are doing fabulous!!!!  Keep up the Good work!!!!

## 2015-01-21 DIAGNOSIS — L309 Dermatitis, unspecified: Secondary | ICD-10-CM | POA: Diagnosis not present

## 2015-01-28 ENCOUNTER — Ambulatory Visit: Payer: Medicare Other

## 2015-01-29 DIAGNOSIS — T7840XA Allergy, unspecified, initial encounter: Secondary | ICD-10-CM | POA: Diagnosis not present

## 2015-02-02 ENCOUNTER — Other Ambulatory Visit: Payer: Self-pay | Admitting: Cardiology

## 2015-02-03 NOTE — Telephone Encounter (Signed)
Rx(s) sent to pharmacy electronically.  

## 2015-02-11 ENCOUNTER — Ambulatory Visit (INDEPENDENT_AMBULATORY_CARE_PROVIDER_SITE_OTHER): Payer: Medicare Other

## 2015-02-11 VITALS — BP 124/69 | HR 90 | Resp 12

## 2015-02-11 DIAGNOSIS — I739 Peripheral vascular disease, unspecified: Secondary | ICD-10-CM | POA: Diagnosis not present

## 2015-02-11 DIAGNOSIS — E1140 Type 2 diabetes mellitus with diabetic neuropathy, unspecified: Secondary | ICD-10-CM | POA: Diagnosis not present

## 2015-02-11 DIAGNOSIS — G629 Polyneuropathy, unspecified: Secondary | ICD-10-CM

## 2015-02-11 DIAGNOSIS — M79606 Pain in leg, unspecified: Secondary | ICD-10-CM

## 2015-02-11 DIAGNOSIS — M722 Plantar fascial fibromatosis: Secondary | ICD-10-CM

## 2015-02-11 MED ORDER — L-METHYLFOLATE-B6-B12 3-35-2 MG PO TABS: 1.0000 | ORAL_TABLET | Freq: Two times a day (BID) | ORAL | Status: DC

## 2015-02-11 NOTE — Progress Notes (Signed)
   Subjective:    Patient ID: [name], female    DOB: 08-Apr-1933, 80 y.o.   MRN: [id number]  HPI  ''B/L ARCHES STILL HAVING STINGING AND ACHING ESPECIALLY AT NIGHT.''  Review of Systems no new findings or systemic changes noted     Objective:   Physical Exam Vascular status still diminished thready pulses at best 39-year-old female did have ask your studies which indicated she has about 80.82 on the ABIs on the right 0.91 on the left with some decreased or abnormal ABIs noted decreased toe pressures as well although no significant allergies otherwise identified we'll monitor for any changes patient has had stent placements to status in the left leg was then the right leg in the past several years was referred by [facility] vascular. At this time patient does have pain discomfort which was benefited by the fascial strapping would benefit from orthotics or arch supports also has diabetic neuropathy and angiopathy with history of keratoses deformity and digital contractures would benefit from new diabetic shoes and custom molded inlays and insoles. We'll obtain authorization for diabetic shoes and insoles at this time patient also continues to have neuropathy and neuritis which is been benefited by metanx.       Assessment & Plan:  Assessment diabetes with history peripheral neuropathy and angiopathy. Obtain authorization for diabetic shoes will follow-up when shoes are ready for fitting and dispensing and get authorization is appropriate in the interim a new prescription formetanx t is given and follow-up within the next month once authorization for shoes obtained. Patient will maintain a good stable shoe in the interim the fascial strapping didn't benefit however all her symptoms would benefit from new orthoses a custom diabetic insoles and diabetic and extra-depth shoes. We'll continue with the oral medications for the neuritis or neuralgia associated with diabetic neuropathy.  Harriet Masson DPM

## 2015-02-11 NOTE — Patient Instructions (Signed)
Diabetes and Foot Care Diabetes may cause you to have problems because of poor blood supply (circulation) to your feet and legs. This may cause the skin on your feet to become thinner, break easier, and heal more slowly. Your skin may become dry, and the skin may peel and crack. You may also have nerve damage in your legs and feet causing decreased feeling in them. You may not notice minor injuries to your feet that could lead to infections or more serious problems. Taking care of your feet is one of the most important things you can do for yourself.  HOME CARE INSTRUCTIONS  Wear shoes at all times, even in the house. Do not go barefoot. Bare feet are easily injured.  Check your feet daily for blisters, cuts, and redness. If you cannot see the bottom of your feet, use a mirror or ask someone for help.  Wash your feet with warm water (do not use hot water) and mild soap. Then pat your feet and the areas between your toes until they are completely dry. Do not soak your feet as this can dry your skin.  Apply a moisturizing lotion or petroleum jelly (that does not contain alcohol and is unscented) to the skin on your feet and to dry, brittle toenails. Do not apply lotion between your toes.  Trim your toenails straight across. Do not dig under them or around the cuticle. File the edges of your nails with an emery board or nail file.  Do not cut corns or calluses or try to remove them with medicine.  Wear clean socks or stockings every day. Make sure they are not too tight. Do not wear knee-high stockings since they may decrease blood flow to your legs.  Wear shoes that fit properly and have enough cushioning. To break in new shoes, wear them for just a few hours a day. This prevents you from injuring your feet. Always look in your shoes before you put them on to be sure there are no objects inside.  Do not cross your legs. This may decrease the blood flow to your feet.  If you find a minor scrape,  cut, or break in the skin on your feet, keep it and the skin around it clean and dry. These areas may be cleansed with mild soap and water. Do not cleanse the area with peroxide, alcohol, or iodine.  When you remove an adhesive bandage, be sure not to damage the skin around it.  If you have a wound, look at it several times a day to make sure it is healing.  Do not use heating pads or hot water bottles. They may burn your skin. If you have lost feeling in your feet or legs, you may not know it is happening until it is too late.  Make sure your health care provider performs a complete foot exam at least annually or more often if you have foot problems. Report any cuts, sores, or bruises to your health care provider immediately. SEEK MEDICAL CARE IF:   You have an injury that is not healing.  You have cuts or breaks in the skin.  You have an ingrown nail.  You notice redness on your legs or feet.  You feel burning or tingling in your legs or feet.  You have pain or cramps in your legs and feet.  Your legs or feet are numb.  Your feet always feel cold. SEEK IMMEDIATE MEDICAL CARE IF:   There is increasing redness,   swelling, or pain in or around a wound.  There is a red line that goes up your leg.  Pus is coming from a wound.  You develop a fever or as directed by your health care provider.  You notice a bad smell coming from an ulcer or wound. Document Released: 11/26/2000 Document Revised: 08/01/2013 Document Reviewed: 05/08/2013 ExitCare Patient Information 2015 [employer]. This information is not intended to replace advice given to you by your health care provider. Make sure you discuss any questions you have with your health care provider.  

## 2015-02-12 DIAGNOSIS — Z1231 Encounter for screening mammogram for malignant neoplasm of breast: Secondary | ICD-10-CM | POA: Diagnosis not present

## 2015-02-21 ENCOUNTER — Other Ambulatory Visit: Payer: Self-pay | Admitting: Obstetrics & Gynecology

## 2015-02-21 DIAGNOSIS — R928 Other abnormal and inconclusive findings on diagnostic imaging of breast: Secondary | ICD-10-CM

## 2015-02-27 ENCOUNTER — Ambulatory Visit
Admission: RE | Admit: 2015-02-27 | Discharge: 2015-02-27 | Disposition: A | Discharge status: Home / Self Care | Payer: Medicare Other | Source: Ambulatory Visit | Attending: Obstetrics & Gynecology | Admitting: Obstetrics & Gynecology

## 2015-02-27 DIAGNOSIS — N6001 Solitary cyst of right breast: Secondary | ICD-10-CM | POA: Diagnosis not present

## 2015-02-27 DIAGNOSIS — R928 Other abnormal and inconclusive findings on diagnostic imaging of breast: Secondary | ICD-10-CM

## 2015-03-03 DIAGNOSIS — E789 Disorder of lipoprotein metabolism, unspecified: Secondary | ICD-10-CM | POA: Diagnosis not present

## 2015-03-03 DIAGNOSIS — E118 Type 2 diabetes mellitus with unspecified complications: Secondary | ICD-10-CM | POA: Diagnosis not present

## 2015-03-11 ENCOUNTER — Ambulatory Visit: Payer: Medicare Other

## 2015-03-20 DIAGNOSIS — R05 Cough: Secondary | ICD-10-CM | POA: Diagnosis not present

## 2015-03-20 DIAGNOSIS — E789 Disorder of lipoprotein metabolism, unspecified: Secondary | ICD-10-CM | POA: Diagnosis not present

## 2015-03-20 DIAGNOSIS — E118 Type 2 diabetes mellitus with unspecified complications: Secondary | ICD-10-CM | POA: Diagnosis not present

## 2015-03-25 ENCOUNTER — Ambulatory Visit: Payer: Medicare Other

## 2015-03-26 DIAGNOSIS — R109 Unspecified abdominal pain: Secondary | ICD-10-CM | POA: Diagnosis not present

## 2015-03-26 DIAGNOSIS — I10 Essential (primary) hypertension: Secondary | ICD-10-CM | POA: Diagnosis not present

## 2015-03-26 DIAGNOSIS — E118 Type 2 diabetes mellitus with unspecified complications: Secondary | ICD-10-CM | POA: Diagnosis not present

## 2015-03-26 DIAGNOSIS — R05 Cough: Secondary | ICD-10-CM | POA: Diagnosis not present

## 2015-04-04 ENCOUNTER — Ambulatory Visit: Payer: Medicare Other | Admitting: Podiatrist

## 2015-04-08 ENCOUNTER — Other Ambulatory Visit: Payer: Self-pay | Admitting: Cardiology

## 2015-04-08 NOTE — Telephone Encounter (Signed)
Rx(s) sent to pharmacy electronically.  

## 2015-04-21 ENCOUNTER — Encounter: Payer: Self-pay | Admitting: Cardiology

## 2015-04-21 ENCOUNTER — Ambulatory Visit (INDEPENDENT_AMBULATORY_CARE_PROVIDER_SITE_OTHER): Payer: Medicare Other | Admitting: Cardiology

## 2015-04-21 VITALS — BP 182/72 | HR 74 | Ht 61.0 in | Wt 177.1 lb

## 2015-04-21 DIAGNOSIS — M79605 Pain in left leg: Secondary | ICD-10-CM | POA: Diagnosis not present

## 2015-04-21 DIAGNOSIS — R0609 Other forms of dyspnea: Secondary | ICD-10-CM

## 2015-04-21 DIAGNOSIS — R600 Localized edema: Secondary | ICD-10-CM | POA: Diagnosis not present

## 2015-04-21 DIAGNOSIS — E789 Disorder of lipoprotein metabolism, unspecified: Secondary | ICD-10-CM | POA: Diagnosis not present

## 2015-04-21 DIAGNOSIS — I208 Other forms of angina pectoris: Secondary | ICD-10-CM | POA: Diagnosis not present

## 2015-04-21 DIAGNOSIS — I10 Essential (primary) hypertension: Secondary | ICD-10-CM

## 2015-04-21 DIAGNOSIS — R0600 Dyspnea, unspecified: Secondary | ICD-10-CM

## 2015-04-21 DIAGNOSIS — I209 Angina pectoris, unspecified: Secondary | ICD-10-CM

## 2015-04-21 DIAGNOSIS — I739 Peripheral vascular disease, unspecified: Secondary | ICD-10-CM

## 2015-04-21 DIAGNOSIS — E669 Obesity, unspecified: Secondary | ICD-10-CM

## 2015-04-21 DIAGNOSIS — E785 Hyperlipidemia, unspecified: Secondary | ICD-10-CM

## 2015-04-21 DIAGNOSIS — I25119 Atherosclerotic heart disease of native coronary artery with unspecified angina pectoris: Secondary | ICD-10-CM

## 2015-04-21 NOTE — Progress Notes (Signed)
PCP: Dwan Bolt, MD  Clinic Note: Chief Complaint  Patient presents with  . ROV 8 months    patient reports chest tightness, shortness of breath on exertion when she "rushes." c/o left leg pain up from foot to groin-especially behind her left knee, this morning left leg was swollen, when she is having chest discomfort the inside of her arms ache.  . New Medication    PCP prescribed Praluent, hasn't started injections yet, still taking Zetia and Crestor  . Coronary Artery Disease  . PAD   HPI: [name] is a 81 y.o. female with a PMH below who presents today for a 44 month old CAD and PAD.  She was also noted to have above-the-knee popliteal stenosis - has been treated by Dr. Quay Burow I last saw her in September of 2015.  Had Flu in [place].   PCP started Repatha - had rash; now changed to Praluent, but has not yet started  Past Medical History  Diagnosis Date  . Atherosclerotic heart disease native coronary artery w/angina pectoris     a. 1992 s/p PTCA of LAD;  b. 1998 CABG x 3 (VG->OM, VG->D1, LIMA->LAD); c. 07/2001 Cath: Sev LM/LAD/LCX dzs, 3/3 patent grafts; d. 03/2013 Myoview: Apical scar w/o ischemia, nl EF;  e. 11/2013 Cath: Known LM/LAD/LCX dzs, VG-OM1 nl, VG->D1 50, LIMA->LAD nl, RCA nl-->Med Rx.;  . Hypertension   . PAD (peripheral artery disease)     a. severe calcified pop and tibial peroneal trunk disease bilat;  b . 05/2013 PTA R Pop, DES to R Peroneal;  c. 09/2013 PTA/Stenting of L Pop (G. Andree Elk);  d. 04/2014 ABI's: R/L: 1.1/1.1.  Marland Kitchen Hyperlipemia   . DM (diabetes mellitus), type 2 with peripheral vascular complications     On oral medications  . GERD (gastroesophageal reflux disease)   . Claudication, lifestyle limiting 05/16/2013  . S/P angioplasty with stent, 05/15/13, Diamondback rot. atherectomy of high grade segmental popliteal stenosis then Chocolate balloonPTA; then angiosculpt PTA of the prox peroneal with stent-Xpedition  05/16/2013  . Bradycardia,  mild briefly to the 40s, asymptomatic 05/16/2013  . Peptic ulcer disease   . Chronic anemia   . Carotid arterial disease     a. 05/2014 Carotid U/S: No signif bilat ICA stenosis, >60% L ECA.  . H/O echocardiogram     a. 02/2011 Echo: EF 50-55%, mild inf HK.    Prior Cardiac Evaluation and Past Surgical History: Past Surgical History  Procedure Laterality Date  . Cardiac catheterization  07/17/01    2 v CAD with LM, circ, LAD, obtuse diag, mod stenosis of diag vein graft , mild stenosis of marg vein graft, nl EF, medical treatment  . Coronary angioplasty  1992    PCI to LAD  . Coronary artery bypass graft  1998    LIMA-LAD, SVG-diagonal (none roughly 50% stenosis), SVG-OM  . Tubal ligation    . Back surgery    . Peroneal artery stent  05/15/13    Diamondback rot. atherectomy of high grade segmental popliteal stenosis then Chocolate balloonPTA; then angiosculpt PTA of the prox peroneal with stent-Xpedition   . Pv angio  07/20/13    high-grade calcified popliteal disease with one-vessel runoff via a small anterior tibial artery that has proximal disease as well, no intervention  . Cardiac catheterization  11/2013    Widely patent LIMA-LAD & SVG-OM with mild progression of proximal stenosis ~40-50% in SVG-D1; Widely patent native RCA with known severe native LCA disease  .  Transthoracic echocardiogram  March 2012    Mild inferior-apical hypokinesis; EF 50-55%. Aortic sclerosis. PA pressure 30-40 mmHg.  Marland Kitchen Lower extremity angiogram N/A 02/22/2013    Procedure: LOWER EXTREMITY ANGIOGRAM;  Surgeon: Lorretta Harp, MD;  Location[facility] CATH LAB;  Service: Cardiovascular;  Laterality: N/A;  . Atherectomy Right 05/15/2013    Procedure: ATHERECTOMY;  Surgeon: Lorretta Harp, MD;  Location[facility] CATH LAB;  Service: Cardiovascular;  Laterality: Right;  popliteal  . Percutaneous stent intervention Right 05/15/2013    Procedure: PERCUTANEOUS STENT INTERVENTION;  Surgeon: Lorretta Harp, MD;  Location: [facility] CATH  LAB;  Service: Cardiovascular;  Laterality: Right;  tibioperoneal trunk  . Lower extremity angiogram N/A 07/20/2013    Procedure: LOWER EXTREMITY ANGIOGRAM;  Surgeon: Lorretta Harp, MD;  Location: [facility] CATH LAB;  Service: Cardiovascular;  Laterality: N/A;  . Left heart catheterization with coronary angiogram N/A 11/27/2013    Procedure: LEFT HEART CATHETERIZATION WITH CORONARY ANGIOGRAM;  Surgeon: [name], MD;  Location: [facility] CATH LAB;  Service: Cardiovascular;  Laterality: N/A;  . Nm myoview ltd  04/03/2013    Lower extremity. Apical perfusion defect with no ischemia. Consider prior infarct versus breast attenuation.   Interval History: over the last few weeks, she says that she's been doing relatively well from a cardiac standpoint since her last visit. Unfortunately she's has started to note intermittent episodes of nausea & chest heaviness - arms (~2-3 time) goes away with ASA.  Has not used NTG.  Episodes just come on - not necessarily associated with exertion.the symptoms lasts several minutes and are usually relieved with taking aspirin. She does note exertional dyspnea and mild claudication with exertion. Gets tired quicker than she used to & gets more SOB with exertion. Felt near syncope during episode of Flu - the was not eating and drinking well. Feels like she has gained weight - exhibit maybe fluid. She denies any PND, orthopnea but does have left leg swelling. Not bilateral edema.  Since recovering from the flu, she denies any palpitations, lightheadedness, dizziness, weakness or syncope/near syncope. No TIA/amaurosis fugax symptoms. No melena, hematochezia, hematuria, or epstaxis.   ROS: A comprehensive Review of Systems - was performed Review of Systems  HENT: Negative for nosebleeds.   Respiratory: Positive for shortness of breath (With exertion).        Recent flu  Cardiovascular: Positive for claudication and leg swelling (Left leg).  Gastrointestinal: Positive for  nausea (since before flu. -- PCP Guaiac negative ) and abdominal pain. Negative for blood in stool and melena.       Black stool  Genitourinary: Negative for hematuria.  Musculoskeletal: Positive for back pain and joint pain (The pain radiates from behind her left knee up to her left inguinal area.).       L Leg behind the knee hurts.  Neurological: Positive for dizziness (positional).  All other systems reviewed and are negative.    Allergies  Allergen Reactions  . Fish Allergy Hives and Shortness Of Breath  . Iohexol Shortness Of Breath  . Ivp Dye [Iodinated Diagnostic Agents] Shortness Of Breath  . Latex Hives, Shortness Of Breath and Itching    REACTION: wheezing  . Shellfish Allergy Hives and Shortness Of Breath    All seafood  . Shellfish-Derived Products Anaphylaxis and Hives  . Glucophage [Metformin Hcl] Other (See Comments)    Instructed by physician not to take anymore   . Metformin Nausea And Vomiting  . Other  Patient reports allergy to perfumed detergents. Causes itching.    Current Outpatient Prescriptions on File Prior to Visit  Medication Sig Dispense Refill  . acetaminophen (TYLENOL) 500 MG tablet Take 1,000 mg by mouth every 6 (six) hours as needed.    . clopidogrel (PLAVIX) 75 MG tablet Take 75 mg by mouth daily.    . Coenzyme Q10 (CO Q 10 PO) Take 1 tablet by mouth daily.     Marland Kitchen estradiol (ESTRACE) 1 MG tablet Take 1 mg by mouth daily.    Marland Kitchen ezetimibe (ZETIA) 10 MG tablet Take 10 mg by mouth daily.    Marland Kitchen glimepiride (AMARYL) 4 MG tablet Take 4 mg by mouth daily before breakfast.    . isosorbide mononitrate (IMDUR) 30 MG 24 hr tablet Take 1 tablet (30 mg total) by mouth daily. 90 tablet 2  . l-methylfolate-B6-B12 (METANX) 3-35-2 MG TABS Take 1 tablet by mouth 2 (two) times daily. 180 tablet 2  . LORazepam (ATIVAN) 0.5 MG tablet Take 0.5 mg by mouth every 6 (six) hours as needed for anxiety.     . Multiple Vitamins-Minerals (CENTRUM SILVER PO) Take 1 tablet by  mouth daily.    . nitroGLYCERIN (NITROSTAT) 0.4 MG SL tablet Place 0.4 mg under the tongue every 5 (five) minutes as needed for chest pain.    . pantoprazole (PROTONIX) 40 MG tablet TAKE 1 TABLET BY MOUTH DAILY 30 tablet 4  . pregabalin (LYRICA) 75 MG capsule Take 75 mg by mouth at bedtime.    . rosuvastatin (CRESTOR) 20 MG tablet Take 20 mg by mouth every evening.     . saxagliptin HCl (ONGLYZA) 5 MG TABS tablet Take 5 mg by mouth daily.    Marland Kitchen amLODipine (NORVASC) 10 MG tablet Take 2.5 mg by mouth daily.    Marland Kitchen aspirin 81 MG tablet Take 81 mg by mouth daily as needed for pain.      No current facility-administered medications on file prior to visit.   History  Substance Use Topics  . Smoking status: Never Smoker   . Smokeless tobacco: Never Used  . Alcohol Use: No     Comment: rare glass of wine    Family History  Problem Relation Age of Onset  . Hypertension Mother   . Hyperlipidemia Mother   . Hyperlipidemia Father   . Hypertension Father     Wt Readings from Last 3 Encounters:  04/21/15 80.332 kg (177 lb 1.6 oz)  11/12/14 81.647 kg (180 lb)  09/04/14 81.647 kg (180 lb)   PHYSICAL EXAM BP 182/72 mmHg  Pulse 74  Ht 5\' 1"  (1.549 m)  Wt 80.332 kg (177 lb 1.6 oz)  BMI 33.48 kg/m2 General appearance: alert, cooperative, appears stated age, no distress and mildly obese  Neck: no adenopathy, no carotid bruit and no JVD  Lungs: clear to auscultation bilaterally, normal percussion bilaterally and non-labored  Heart: RRR, normal S1, S2, no murmur, click, rub or gallop; nondisplaced PMI  Abdomen: soft, non-tender; bowel sounds normal; no masses, no organomegaly; mild obesity  Extremities: extremities normal, atraumatic, no cyanosis, or edema;There is some mild bruising on the left wrist.  Pulses: 2+ and symmetric upper extremities. Still has diminished pedal pulses, but stable.;  Neurologic: Mental status: Alert, oriented, thought content appropriate; Cranial nerves: normal (II-XII  grossly intact)   Adult ECG Report  Rate: 74 ;  Rhythm: normal sinus rhythm ; RSR', anteroseptal T wave inversions, cannot rule out ischemia.  Narrative Interpretation: No notable changes from past  EKG  Recent Labs:   Lab Results  Component Value Date   CHOL 248 05/31/2014   HDL 54 She was started on Repatha - had a rash - converted to Praluent.   LDLCALC 166    TRIG 117    CHOLHDL 4.5     ASSESSMENT / PLAN: Problem List Items Addressed This Visit    Angina, class II - Primary    I am concerned that she is having these intermittent episodes of chest tightness and nausea. Although now is exertional in nature.. She is not a great historian.  I'm concerned that she could be having some anginal symptoms.She always has strange atypical symptoms but this is new and associated with exertional dyspnea. She is more hypertensive which could be part of the problem stemming from diastolic dysfunction.  Plan:  Lexiscan Myoview  Increase carvedilol dose to 6.25 twice a day for better blood pressure control and in fact  2-D echocardiogram to evaluate systolic and diastolic function.      Relevant Orders   EKG 12-Lead (Completed)   Myocardial Perfusion Imaging   LE VENOUS   Echocardiogram   Atherosclerotic heart disease of native coronary artery with angina pectoris (Chronic)    In the past she has had atypical sounding discomfort but no relief with nitroglycerin.  Now her symptoms are more concerning for angina. They are with moderate exertion associated with dyspnea as well.  She is on aspirin plus Plavix for combination CAD and PVD. She takes amlodipine and Imdur along with carvedilol. Also on a statin.  Plan: Increase carvedilol to 25 mg twice a day. Lexiscan Myoview. Next up would be like to titrate up Imdur dose.      DOE (dyspnea on exertion) (Chronic)    This is a worsening symptom for her. Next number concern for possible angina. See plan for class II angina   Lexiscan  Myoview and echocardiogram. Increasing blood pressure control.       Relevant Orders   EKG 12-Lead (Completed)   Myocardial Perfusion Imaging   LE VENOUS   Echocardiogram   Dyslipidemia, goal LDL below 70 (Chronic)    On Zetia. Had been on pravastatin, but now on rosuvastatin 20 mg. Plan is also to start Praluent after she had a rash with Repatha. I don't see any labs since June of last year. This is then followed by her Endocrinologist  -- Dr. Wilson Singer. I will defer to him. Goal LDL being less than 70 with her severe CAD and PAD is crucial.      Essential hypertension (Chronic)    Very poorly controlled. She is onB. Increasing dose of carvedilol 6.25 twice a day. She should be on ACE inhibitor or ARBgiven her diabetes. This is the next step.      Leg edema, left    Unilateral swelling and pain in the left leg. This does not sound like claudication. I don't feel any focal swelling behind the knee, there is concern for possible Baker's cyst. Also with swelling concern for possible thrombophlebitis/DVT.  Lower extremity venous Dopplers.      Relevant Orders   LE VENOUS   Limb pain- echymosis, pain Lt radial artery after cath   Relevant Orders   LE VENOUS   Obesity (BMI 30-39.9) (Chronic)    Despite her having a sensation of having gained weight, she is actually 1 pound lighter since her last visit. Need to resolve the issue of her exertional dyspnea and chest discomfort so she can get  back to exercising. Discussed dietary modifications.      PAD,severe calcified pop and tibial peroneal trunk disease bilateraly (Chronic)    Her PAD is beingfollowed by Dr. Quay Burow. Notable improvement in her claudication symptoms after his interventions. She still does have some discomfort but at present it may be more related to her left leg pain that she's feeling. She also has peripheral neuropathy.        Complex, difficult visit. She is not a very good historian overall time the  patient was close to 60 minutes. We discussed treatment options as well as her multiple medical problems.  Followup: ~1 months    HARDING, Leonie Green, M.D., M.S. Interventional Cardiologist   Pager # [phone]

## 2015-04-21 NOTE — Patient Instructions (Addendum)
Your physician has requested that you have an echocardiogram. Echocardiography is a painless test that uses sound waves to create images of your heart. It provides your doctor with information about the size and shape of your heart and how well your heart's chambers and valves are working. This procedure takes approximately one hour. There are no restrictions for this procedure.  Your physician has requested that you have a lexiscan myoview. For further information please visit [email or web address]. Please follow instruction sheet, as given.  LEFT LEG DOPPLER Your physician has requested that you have a lower extremity venous duplex. This test is an ultrasound of the veins in the legs. It looks at venous blood flow that carries blood from the heart to the legs. Allow one hour for a Lower Venous exam. There are no restrictions or special instructions.  INCREASE CARVEDILOL TO 6.25 MG  1 TABLET TWICE A DAY  ( YOU MAY TAKE 2 TABLET OF 3.125 MG TWICE A DAY UNTIL THE BOTTLE  IS COMPLETED.)   Your physician wants you to follow-up in   MONTH[address] DR HARDING 30 MIN APPOINTMENT. You will receive a reminder letter in the mail two months in advance. If you don't receive a letter, please call our office to schedule the follow-up appointment.

## 2015-04-22 ENCOUNTER — Encounter: Payer: Self-pay | Admitting: Cardiology

## 2015-04-22 DIAGNOSIS — R0600 Dyspnea, unspecified: Secondary | ICD-10-CM | POA: Insufficient documentation

## 2015-04-22 DIAGNOSIS — R600 Localized edema: Secondary | ICD-10-CM

## 2015-04-22 DIAGNOSIS — R0609 Other forms of dyspnea: Secondary | ICD-10-CM

## 2015-04-22 DIAGNOSIS — I209 Angina pectoris, unspecified: Secondary | ICD-10-CM

## 2015-04-22 DIAGNOSIS — I872 Venous insufficiency (chronic) (peripheral): Secondary | ICD-10-CM | POA: Insufficient documentation

## 2015-04-22 HISTORY — DX: Other forms of dyspnea: R06.09

## 2015-04-22 HISTORY — DX: Angina pectoris, unspecified: I20.9

## 2015-04-22 HISTORY — DX: Dyspnea, unspecified: R06.00

## 2015-04-22 HISTORY — DX: Localized edema: R60.0

## 2015-04-22 NOTE — Assessment & Plan Note (Signed)
Unilateral swelling and pain in the left leg. This does not sound like claudication. I don't feel any focal swelling behind the knee, there is concern for possible Baker's cyst. Also with swelling concern for possible thrombophlebitis/DVT.  Lower extremity venous Dopplers.

## 2015-04-22 NOTE — Assessment & Plan Note (Addendum)
Very poorly controlled. She is onB. Increasing dose of carvedilol 6.25 twice a day. She should be on ACE inhibitor or ARBgiven her diabetes. This is the next step.

## 2015-04-22 NOTE — Assessment & Plan Note (Signed)
This is a worsening symptom for her. Next number concern for possible angina. See plan for class II angina   Lexiscan Myoview and echocardiogram. Increasing blood pressure control.

## 2015-04-22 NOTE — Assessment & Plan Note (Signed)
Her PAD is beingfollowed by Dr. Quay Burow. Notable improvement in her claudication symptoms after his interventions. She still does have some discomfort but at present it may be more related to her left leg pain that she's feeling. She also has peripheral neuropathy.

## 2015-04-22 NOTE — Assessment & Plan Note (Signed)
I am concerned that she is having these intermittent episodes of chest tightness and nausea. Although now is exertional in nature.. She is not a great historian.  I'm concerned that she could be having some anginal symptoms.She always has strange atypical symptoms but this is new and associated with exertional dyspnea. She is more hypertensive which could be part of the problem stemming from diastolic dysfunction.  Plan:  Lexiscan Myoview  Increase carvedilol dose to 6.25 twice a day for better blood pressure control and in fact  2-D echocardiogram to evaluate systolic and diastolic function.

## 2015-04-22 NOTE — Assessment & Plan Note (Signed)
In the past she has had atypical sounding discomfort but no relief with nitroglycerin.  Now her symptoms are more concerning for angina. They are with moderate exertion associated with dyspnea as well.  She is on aspirin plus Plavix for combination CAD and PVD. She takes amlodipine and Imdur along with carvedilol. Also on a statin.  Plan: Increase carvedilol to 25 mg twice a day. Lexiscan Myoview. Next up would be like to titrate up Imdur dose.

## 2015-04-23 ENCOUNTER — Ambulatory Visit (HOSPITAL_COMMUNITY)
Admission: RE | Admit: 2015-04-23 | Discharge: 2015-04-23 | Disposition: A | Discharge status: Home / Self Care | Payer: Medicare Other | Source: Ambulatory Visit | Attending: Vascular Surgery | Admitting: Vascular Surgery

## 2015-04-23 ENCOUNTER — Other Ambulatory Visit (HOSPITAL_COMMUNITY): Payer: Self-pay | Admitting: Endocrinology

## 2015-04-23 DIAGNOSIS — M7989 Other specified soft tissue disorders: Secondary | ICD-10-CM | POA: Diagnosis not present

## 2015-04-23 DIAGNOSIS — M79605 Pain in left leg: Secondary | ICD-10-CM | POA: Diagnosis not present

## 2015-04-23 DIAGNOSIS — G629 Polyneuropathy, unspecified: Secondary | ICD-10-CM | POA: Diagnosis not present

## 2015-04-23 DIAGNOSIS — E789 Disorder of lipoprotein metabolism, unspecified: Secondary | ICD-10-CM | POA: Diagnosis not present

## 2015-04-23 DIAGNOSIS — E118 Type 2 diabetes mellitus with unspecified complications: Secondary | ICD-10-CM | POA: Diagnosis not present

## 2015-04-23 NOTE — Assessment & Plan Note (Signed)
Despite her having a sensation of having gained weight, she is actually 1 pound lighter since her last visit. Need to resolve the issue of her exertional dyspnea and chest discomfort so she can get back to exercising. Discussed dietary modifications.

## 2015-04-23 NOTE — Assessment & Plan Note (Signed)
On Zetia. Had been on pravastatin, but now on rosuvastatin 20 mg. Plan is also to start Praluent after she had a rash with Repatha. I don't see any labs since June of last year. This is then followed by her Endocrinologist  -- Dr. Wilson Singer. I will defer to him. Goal LDL being less than 70 with her severe CAD and PAD is crucial.

## 2015-04-25 ENCOUNTER — Encounter: Payer: Self-pay | Admitting: Podiatry

## 2015-04-25 ENCOUNTER — Ambulatory Visit (INDEPENDENT_AMBULATORY_CARE_PROVIDER_SITE_OTHER): Payer: Medicare Other | Admitting: Podiatry

## 2015-04-25 ENCOUNTER — Telehealth: Payer: Self-pay | Admitting: Cardiovascular Disease

## 2015-04-25 VITALS — BP 170/102 | HR 84 | Resp 16

## 2015-04-25 DIAGNOSIS — I208 Other forms of angina pectoris: Secondary | ICD-10-CM

## 2015-04-25 DIAGNOSIS — R52 Pain, unspecified: Secondary | ICD-10-CM | POA: Diagnosis not present

## 2015-04-25 DIAGNOSIS — I739 Peripheral vascular disease, unspecified: Secondary | ICD-10-CM | POA: Diagnosis not present

## 2015-04-29 NOTE — Progress Notes (Signed)
Subjective: 81 year old female presents the office today for follow-up with evaluation of left foot pain. She states that recently she started having increasing pain into her left thigh and hip area and her foot has improved however. She states that she has had stents placed due to peripheral vascular disease to both legs and she is concerned about her circulation. She does continue to have pain mostly in the ball of her left foot and states that the pain is going up to her thigh. She has oblique that she's having some knee pain as well however she is not seen anybody for this. Denies any recent injury or trauma. No other complaints at this time.  Objective: AAO 3, NAD DP/PT pulses decreased bilaterally, CRT less than 3 seconds Protective sensation decreased with Simms Weinstein monofilament, Achilles tendon reflex intact. There is mild tenderness palpation along the midfoot however there is no specific area pinpoint bony tenderness or pain the vibratory sensation. There is no tenderness palpation along the plantar aspect of the calcaneus on the course last insertion of the plantar fascia. There is no overlying edema, erythema, increase in warmth of bilateral lower extremities. No open lesions or pre-ulcer lesions identified bilaterally. No pain with calf compression, swelling, warmth, erythema.  Assessment: 81 year old female with left leg pain, neuropathy, PVD  Plan: -Treatment options were discussed including alternatives, risks, complications. -At this time I do not believe that the pain in the foot is causing the pain in her legs or thigh. Given her history of PVD/multiple interventions I do believe that she would benefit from follow-up evaluation with Dr. Gwenlyn Found to assess the circulation to ensure adequate flow.   -Also, recommended follow-up with orthotopics for left knee pain which may be a contributing factor.  -Continue with diabetic shoes/inserts to help with the foot.  -Follow-up after  evaluation by Dr. Gwenlyn Found and orthopedics. In the meantime, call with any questions/concerns/chnage in symptoms.

## 2015-05-01 NOTE — Telephone Encounter (Signed)
Close encounter 

## 2015-05-06 ENCOUNTER — Telehealth (HOSPITAL_COMMUNITY): Payer: Self-pay

## 2015-05-06 NOTE — Telephone Encounter (Signed)
Encounter complete. 

## 2015-05-08 ENCOUNTER — Ambulatory Visit (HOSPITAL_COMMUNITY)
Admission: RE | Admit: 2015-05-08 | Discharge: 2015-05-08 | Disposition: A | Discharge status: Home / Self Care | Payer: Medicare Other | Source: Ambulatory Visit | Attending: Cardiology | Admitting: Cardiology

## 2015-05-08 DIAGNOSIS — R0609 Other forms of dyspnea: Secondary | ICD-10-CM | POA: Diagnosis not present

## 2015-05-08 DIAGNOSIS — I208 Other forms of angina pectoris: Secondary | ICD-10-CM

## 2015-05-08 DIAGNOSIS — I209 Angina pectoris, unspecified: Secondary | ICD-10-CM

## 2015-05-08 DIAGNOSIS — R0600 Dyspnea, unspecified: Secondary | ICD-10-CM

## 2015-05-08 LAB — MYOCARDIAL PERFUSION IMAGING
CHL CUP RESTING HR STRESS: 88 {beats}/min
CHL CUP STRESS STAGE 1 GRADE: 0 %
CHL CUP STRESS STAGE 1 SPEED: 0 mph
CHL CUP STRESS STAGE 2 HR: 71 {beats}/min
CHL CUP STRESS STAGE 3 GRADE: 0 %
CHL CUP STRESS STAGE 3 HR: 100 {beats}/min
CHL CUP STRESS STAGE 4 SBP: 166 mmHg
CHL CUP STRESS STAGE 4 SPEED: 0 mph
CSEPPMHR: 70 %
Estimated workload: 1 METS
LVDIAVOL: 75 mL
LVSYSVOL: 29 mL
Nuc Stress EF: 62 %
Peak HR: 100 {beats}/min
SDS: 0
SRS: 1
SSS: 1
Stage 1 DBP: 64 mmHg
Stage 1 HR: 71 {beats}/min
Stage 1 SBP: 157 mmHg
Stage 2 Grade: 0 %
Stage 2 Speed: 0 mph
Stage 3 Speed: 0 mph
Stage 4 DBP: 86 mmHg
Stage 4 Grade: 0 %
Stage 4 HR: 79 {beats}/min
TID: 1.5

## 2015-05-08 MED ORDER — REGADENOSON 0.4 MG/5ML IV SOLN
0.4000 mg | Freq: Once | INTRAVENOUS | Status: AC
  Administered 2015-05-08: 0.4 mg via INTRAVENOUS

## 2015-05-08 MED ORDER — AMINOPHYLLINE 25 MG/ML IV SOLN
75.0000 mg | Freq: Once | INTRAVENOUS | Status: AC
  Administered 2015-05-08: 75 mg via INTRAVENOUS

## 2015-05-08 MED ORDER — TECHNETIUM TC 99M SESTAMIBI GENERIC - CARDIOLITE
9.9000 | Freq: Once | INTRAVENOUS | Status: AC | PRN
  Administered 2015-05-08: 10 via INTRAVENOUS

## 2015-05-08 MED ORDER — TECHNETIUM TC 99M SESTAMIBI GENERIC - CARDIOLITE
29.7000 | Freq: Once | INTRAVENOUS | Status: AC | PRN
  Administered 2015-05-08: 30 via INTRAVENOUS

## 2015-05-13 ENCOUNTER — Ambulatory Visit (HOSPITAL_BASED_OUTPATIENT_CLINIC_OR_DEPARTMENT_OTHER)
Admission: RE | Admit: 2015-05-13 | Discharge: 2015-05-13 | Disposition: A | Discharge status: Home / Self Care | Payer: Medicare Other | Source: Ambulatory Visit | Attending: Cardiology | Admitting: Cardiology

## 2015-05-13 ENCOUNTER — Ambulatory Visit (HOSPITAL_COMMUNITY)
Admission: RE | Admit: 2015-05-13 | Discharge: 2015-05-13 | Disposition: A | Discharge status: Home / Self Care | Payer: Medicare Other | Source: Ambulatory Visit | Attending: Cardiology | Admitting: Cardiology

## 2015-05-13 DIAGNOSIS — M79605 Pain in left leg: Secondary | ICD-10-CM

## 2015-05-13 DIAGNOSIS — R0600 Dyspnea, unspecified: Secondary | ICD-10-CM

## 2015-05-13 DIAGNOSIS — I208 Other forms of angina pectoris: Secondary | ICD-10-CM

## 2015-05-13 DIAGNOSIS — R600 Localized edema: Secondary | ICD-10-CM | POA: Diagnosis not present

## 2015-05-13 DIAGNOSIS — R0609 Other forms of dyspnea: Secondary | ICD-10-CM

## 2015-05-13 DIAGNOSIS — I10 Essential (primary) hypertension: Secondary | ICD-10-CM | POA: Diagnosis not present

## 2015-05-13 DIAGNOSIS — I209 Angina pectoris, unspecified: Secondary | ICD-10-CM

## 2015-05-13 DIAGNOSIS — E785 Hyperlipidemia, unspecified: Secondary | ICD-10-CM | POA: Insufficient documentation

## 2015-05-13 NOTE — Progress Notes (Signed)
Quick Note:  Stress Test looked good!! No sign of significant Heart Artery Disease. Pump function is normal.  Good news!!.  HARDING,DAVID W, MD  ______ 

## 2015-05-14 ENCOUNTER — Ambulatory Visit (INDEPENDENT_AMBULATORY_CARE_PROVIDER_SITE_OTHER): Payer: Medicare Other | Admitting: Cardiovascular Disease

## 2015-05-14 ENCOUNTER — Encounter: Payer: Self-pay | Admitting: Cardiovascular Disease

## 2015-05-14 VITALS — BP 160/108 | HR 86 | Ht 63.0 in | Wt 176.3 lb

## 2015-05-14 DIAGNOSIS — I739 Peripheral vascular disease, unspecified: Secondary | ICD-10-CM

## 2015-05-14 DIAGNOSIS — I208 Other forms of angina pectoris: Secondary | ICD-10-CM

## 2015-05-14 NOTE — Patient Instructions (Signed)
  We will see you back in follow up in 6 months with Dr Gwenlyn Found.   Dr Gwenlyn Found has ordered: 1. Lower extremity arterial doppler- During this test, ultrasound is used to evaluate arterial blood flow in the legs. Allow approximately one hour for this exam.

## 2015-05-14 NOTE — Progress Notes (Signed)
Quick Note:  Echo results: Good news: Essentially normal echocardiogram and normal pump function and normal valve function. No regional wall motion abnormalities - No signs to suggest heart attack.. EF: 60-65%.  HARDING, DAVID W, MD     ______ 

## 2015-05-14 NOTE — Progress Notes (Signed)
05/14/2015 [name]   [birth year]  [id number]  Primary Physician Monica Bolt, MD Primary Cardiologist: Monica Harp MD [name]   HPI:  [name] is a 81 year old married African American female patient of Dr. Shanon Brow Harding's and Dr. Leigh Aurora. She has known CAD status post coronary bypass grafting in the past (1998). She has preserved LV function and nonischemic Myoview. Because of claudication she underwent peripheral vascular angiography and other [name] 02/22/13 revealing significant popliteal and infrapopliteal disease. She has lifestyle limiting claudication.on 05/16/13 she underwent diamondback orbital rotational atherectomy, PTA of her distal right SFA and stenting using coronary drug-eluting stent for peroneal artery which was her only patent artery on the right. She does not notice any improvement and claudication since her procedure although she says her right leg his less "heavy" and that her left leg is much more symptomatic.I restudied her 07/20/13 with the intention of fixing her left leg however I decided to defer this because of anatomic complexity. Ultimately I referred her to [name] in [place] who performed intervention on her left popliteal and tibioperoneal trunk on 10/01/13. Followup Dopplers performed 04/29/14 were excellent with ABIs of greater than 1 bilaterally, patent peroneal stent on the right a patent left popliteal. Subsequent Dopplers performed in December showed a right ABI of 0.81 and a left ABI 0.91 with patent stents. She is developed some atypical left lower extremity discomfort which may be related to her hip and/or circulation although I can feel a pedal pulse. Venous Dopplers from yesterday showed no evidence of DVT.    Current Outpatient Prescriptions  Medication Sig Dispense Refill  . acetaminophen (TYLENOL) 500 MG tablet Take 1,000 mg by mouth every 6 (six) hours as needed.    . Alirocumab 75 MG/ML SOPN Inject 75  mg into the skin. 2 times a month    . amLODipine (NORVASC) 10 MG tablet Take 2.5 mg by mouth daily.    Marland Kitchen aspirin 81 MG tablet Take 81 mg by mouth daily as needed for pain.     . benzonatate (TESSALON) 200 MG capsule Take 1 capsule by mouth 3 (three) times daily as needed.  1  . carvedilol (COREG) 6.25 MG tablet Take 6.25 mg by mouth 2 (two) times daily with a meal.    . clobetasol (TEMOVATE) 0.05 % external solution Apply 1 application topically 2 (two) times daily as needed.  3  . clopidogrel (PLAVIX) 75 MG tablet Take 75 mg by mouth daily.    . Coenzyme Q10 (CO Q 10 PO) Take 1 tablet by mouth daily.     Marland Kitchen estradiol (ESTRACE) 1 MG tablet Take 1 mg by mouth daily.    Marland Kitchen ezetimibe (ZETIA) 10 MG tablet Take 10 mg by mouth daily.    Marland Kitchen glimepiride (AMARYL) 4 MG tablet Take 4 mg by mouth daily before breakfast.    . isosorbide mononitrate (IMDUR) 30 MG 24 hr tablet Take 1 tablet (30 mg total) by mouth daily. 90 tablet 2  . l-methylfolate-B6-B12 (METANX) 3-35-2 MG TABS Take 1 tablet by mouth 2 (two) times daily. 180 tablet 2  . LANTUS SOLOSTAR 100 UNIT/ML Solostar Pen Inject 10 Units as directed daily.  5  . LORazepam (ATIVAN) 0.5 MG tablet Take 0.5 mg by mouth every 6 (six) hours as needed for anxiety.     . Multiple Vitamins-Minerals (CENTRUM SILVER PO) Take 1 tablet by mouth daily.    . nitroGLYCERIN (NITROSTAT) 0.4 MG SL tablet Place 0.4  mg under the tongue every 5 (five) minutes as needed for chest pain.    . pantoprazole (PROTONIX) 40 MG tablet TAKE 1 TABLET BY MOUTH DAILY 30 tablet 4  . pregabalin (LYRICA) 75 MG capsule Take 75 mg by mouth at bedtime.    . rosuvastatin (CRESTOR) 20 MG tablet Take 20 mg by mouth every evening.     . saxagliptin HCl (ONGLYZA) 5 MG TABS tablet Take 5 mg by mouth daily.     No current facility-administered medications for this visit.    Allergies  Allergen Reactions  . Fish Allergy Hives and Shortness Of Breath  . Iohexol Shortness Of Breath  . Ivp Dye  [Iodinated Diagnostic Agents] Shortness Of Breath  . Latex Hives, Shortness Of Breath and Itching    REACTION: wheezing  . Shellfish Allergy Hives and Shortness Of Breath    All seafood  . Shellfish-Derived Products Anaphylaxis and Hives  . Glucophage [Metformin Hcl] Other (See Comments)    Instructed by physician not to take anymore   . Metformin Nausea And Vomiting  . Other     Patient reports allergy to perfumed detergents. Causes itching.    History   Social History  . Marital Status: Married    Spouse Name: N/A  . Number of Children: N/A  . Years of Education: N/A   Occupational History  . Not on file.   Social History Main Topics  . Smoking status: Never Smoker   . Smokeless tobacco: Never Used  . Alcohol Use: No     Comment: rare glass of wine   . Drug Use: No  . Sexual Activity: Not on file   Other Topics Concern  . Not on file   Social History Narrative    She is a married mother of 107, grandmother of 25, great grandmother of 61.    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    She does not really get a lot of exercise,    She does not smoke or drink EtOH.     Review of Systems: General: negative for chills, fever, night sweats or weight changes.  Cardiovascular: negative for chest pain, dyspnea on exertion, edema, orthopnea, palpitations, paroxysmal nocturnal dyspnea or shortness of breath Dermatological: negative for rash Respiratory: negative for cough or wheezing Urologic: negative for hematuria Abdominal: negative for nausea, vomiting, diarrhea, bright red blood per rectum, melena, or hematemesis Neurologic: negative for visual changes, syncope, or dizziness All other systems reviewed and are otherwise negative except as noted above.    Blood pressure 160/108, pulse 86, height 5\' 3"  (1.6 m), weight 176 lb 4.8 oz (79.969 kg).  General appearance: alert and no distress Neck: no adenopathy, no carotid bruit, no JVD, supple, symmetrical,  trachea midline and thyroid not enlarged, symmetric, no tenderness/mass/nodules Lungs: clear to auscultation bilaterally Heart: regular rate and rhythm, S1, S2 normal, no murmur, click, rub or gallop Extremities: palpable left pedal pulse  EKG not performed today  ASSESSMENT AND PLAN:   PAD,severe calcified pop and tibial peroneal trunk disease bilateraly History of peripheral arterial disease status post diamondback orbital rotational atherectomy, PTCA of her distal right SFA with stenting of her peroneal artery using a coronary drug-eluting stent on 05/16/13. I ultimately restudied her 07/20/13 with intention of fixing her left leg the right side to defer this because of anatomic complexity. I ultimately referred her to [name] who intervened on the left side 10/01/13 with excellent result. Her follow-up lower  extremity arterial Doppler studies performed office 11/21/14 revealed a right ABI 0.82 and a left ABI of 0.91. Her SFA stents appeared to be patent. She developed some atypical left leg pain and last several months and has seen [name] and Dr. Jacqualyn Posey for this. It does not sound classically like claudication as she has a palpable left pedal pulse. I am going obtain lower actually arterial Doppler studies to further investigate these symptoms. Venous Dopplers performed yesterday showed no evidence of thrombosis.       Monica Harp MD FACP,FACC,FAHA, [facility] 05/14/2015 10:50 AM

## 2015-05-14 NOTE — Assessment & Plan Note (Signed)
History of peripheral arterial disease status post diamondback orbital rotational atherectomy, PTCA of her distal right SFA with stenting of her peroneal artery using a coronary drug-eluting stent on 05/16/13. I ultimately restudied her 07/20/13 with intention of fixing her left leg the right side to defer this because of anatomic complexity. I ultimately referred her to Dr. Brunetta Jeans who intervened on the left side 10/01/13 with excellent result. Her follow-up lower extremity arterial Doppler studies performed office 11/21/14 revealed a right ABI 0.82 and a left ABI of 0.91. Her SFA stents appeared to be patent. She developed some atypical left leg pain and last several months and has seen [name] and Dr. Jacqualyn Posey for this. It does not sound classically like claudication as she has a palpable left pedal pulse. I am going obtain lower actually arterial Doppler studies to further investigate these symptoms. Venous Dopplers performed yesterday showed no evidence of thrombosis.

## 2015-05-16 ENCOUNTER — Telehealth: Payer: Self-pay | Admitting: *Deleted

## 2015-05-16 NOTE — Telephone Encounter (Signed)
-----   Message from [name], MD sent at 05/14/2015  7:56 AM EDT ----- Echo results: Good news: Essentially normal echocardiogram and normal pump function and normal valve function.  No regional wall motion abnormalities = No signs to suggest heart attack.. EF: 60-65%.  [name], MD

## 2015-05-16 NOTE — Telephone Encounter (Signed)
Spoke to patient. Result given . Verbalized understanding  

## 2015-05-21 DIAGNOSIS — S50861A Insect bite (nonvenomous) of right forearm, initial encounter: Secondary | ICD-10-CM | POA: Diagnosis not present

## 2015-05-21 DIAGNOSIS — L500 Allergic urticaria: Secondary | ICD-10-CM | POA: Diagnosis not present

## 2015-05-22 ENCOUNTER — Encounter: Payer: Self-pay | Admitting: Gastroenterology

## 2015-05-23 ENCOUNTER — Other Ambulatory Visit: Payer: Self-pay | Admitting: Cardiology

## 2015-05-23 DIAGNOSIS — R0989 Other specified symptoms and signs involving the circulatory and respiratory systems: Secondary | ICD-10-CM

## 2015-05-28 ENCOUNTER — Encounter (HOSPITAL_COMMUNITY): Payer: Self-pay | Admitting: Emergency Medicine

## 2015-05-28 ENCOUNTER — Encounter (HOSPITAL_COMMUNITY): Payer: Medicare Other

## 2015-05-28 ENCOUNTER — Emergency Department (HOSPITAL_COMMUNITY)
Admission: EM | Admit: 2015-05-28 | Discharge: 2015-05-28 | Disposition: A | Discharge status: Home / Self Care | Payer: Medicare Other | Attending: Emergency Medicine | Admitting: Emergency Medicine

## 2015-05-28 DIAGNOSIS — I25119 Atherosclerotic heart disease of native coronary artery with unspecified angina pectoris: Secondary | ICD-10-CM | POA: Insufficient documentation

## 2015-05-28 DIAGNOSIS — R21 Rash and other nonspecific skin eruption: Secondary | ICD-10-CM | POA: Diagnosis not present

## 2015-05-28 DIAGNOSIS — E1159 Type 2 diabetes mellitus with other circulatory complications: Secondary | ICD-10-CM | POA: Diagnosis not present

## 2015-05-28 DIAGNOSIS — Z862 Personal history of diseases of the blood and blood-forming organs and certain disorders involving the immune mechanism: Secondary | ICD-10-CM | POA: Diagnosis not present

## 2015-05-28 DIAGNOSIS — Z79899 Other long term (current) drug therapy: Secondary | ICD-10-CM | POA: Insufficient documentation

## 2015-05-28 DIAGNOSIS — E785 Hyperlipidemia, unspecified: Secondary | ICD-10-CM | POA: Diagnosis not present

## 2015-05-28 DIAGNOSIS — K219 Gastro-esophageal reflux disease without esophagitis: Secondary | ICD-10-CM | POA: Diagnosis not present

## 2015-05-28 DIAGNOSIS — L299 Pruritus, unspecified: Secondary | ICD-10-CM | POA: Diagnosis not present

## 2015-05-28 DIAGNOSIS — R220 Localized swelling, mass and lump, head: Secondary | ICD-10-CM | POA: Diagnosis present

## 2015-05-28 DIAGNOSIS — Z7902 Long term (current) use of antithrombotics/antiplatelets: Secondary | ICD-10-CM | POA: Insufficient documentation

## 2015-05-28 DIAGNOSIS — I10 Essential (primary) hypertension: Secondary | ICD-10-CM | POA: Insufficient documentation

## 2015-05-28 DIAGNOSIS — Z91040 Latex allergy status: Secondary | ICD-10-CM | POA: Diagnosis not present

## 2015-05-28 DIAGNOSIS — Z7982 Long term (current) use of aspirin: Secondary | ICD-10-CM | POA: Insufficient documentation

## 2015-05-28 LAB — CBC WITH DIFFERENTIAL/PLATELET
BASOS ABS: 0 10*3/uL (ref 0.0–0.1)
Basophils Relative: 1 % (ref 0–1)
Eosinophils Absolute: 0.4 10*3/uL (ref 0.0–0.7)
Eosinophils Relative: 6 % — ABNORMAL HIGH (ref 0–5)
HEMATOCRIT: 39.1 % (ref 36.0–46.0)
HEMOGLOBIN: 12.2 g/dL (ref 12.0–15.0)
Lymphocytes Relative: 21 % (ref 12–46)
Lymphs Abs: 1.2 10*3/uL (ref 0.7–4.0)
MCH: 27.4 pg (ref 26.0–34.0)
MCHC: 31.2 g/dL (ref 30.0–36.0)
MCV: 87.9 fL (ref 78.0–100.0)
Monocytes Absolute: 0.5 10*3/uL (ref 0.1–1.0)
Monocytes Relative: 9 % (ref 3–12)
NEUTROS ABS: 3.6 10*3/uL (ref 1.7–7.7)
Neutrophils Relative %: 63 % (ref 43–77)
Platelets: 305 10*3/uL (ref 150–400)
RBC: 4.45 MIL/uL (ref 3.87–5.11)
RDW: 14.3 % (ref 11.5–15.5)
WBC: 5.6 10*3/uL (ref 4.0–10.5)

## 2015-05-28 LAB — COMPREHENSIVE METABOLIC PANEL
ALT: 16 U/L (ref 14–54)
ANION GAP: 9 (ref 5–15)
AST: 23 U/L (ref 15–41)
Albumin: 4 g/dL (ref 3.5–5.0)
Alkaline Phosphatase: 43 U/L (ref 38–126)
BUN: 11 mg/dL (ref 6–20)
CALCIUM: 9.4 mg/dL (ref 8.9–10.3)
CO2: 27 mmol/L (ref 22–32)
CREATININE: 0.86 mg/dL (ref 0.44–1.00)
Chloride: 102 mmol/L (ref 101–111)
GLUCOSE: 139 mg/dL — AB (ref 65–99)
Potassium: 4.4 mmol/L (ref 3.5–5.1)
Sodium: 138 mmol/L (ref 135–145)
Total Bilirubin: 0.3 mg/dL (ref 0.3–1.2)
Total Protein: 7.5 g/dL (ref 6.5–8.1)

## 2015-05-28 MED ORDER — METHYLPREDNISOLONE SODIUM SUCC 125 MG IJ SOLR
125.0000 mg | Freq: Once | INTRAMUSCULAR | Status: AC
  Administered 2015-05-28: 125 mg via INTRAVENOUS
  Filled 2015-05-28: qty 2

## 2015-05-28 MED ORDER — PREDNISONE 20 MG PO TABS: 40.0000 mg | ORAL_TABLET | Freq: Every day | ORAL | Status: DC

## 2015-05-28 MED ORDER — ACETAMINOPHEN 325 MG PO TABS
650.0000 mg | ORAL_TABLET | Freq: Once | ORAL | Status: AC
  Administered 2015-05-28: 650 mg via ORAL
  Filled 2015-05-28: qty 2

## 2015-05-28 MED ORDER — DIPHENHYDRAMINE HCL 50 MG/ML IJ SOLN
25.0000 mg | Freq: Once | INTRAMUSCULAR | Status: AC
  Administered 2015-05-28: 25 mg via INTRAVENOUS
  Filled 2015-05-28: qty 1

## 2015-05-28 NOTE — ED Notes (Signed)
Vital signs stable. 

## 2015-05-28 NOTE — ED Notes (Signed)
Pt c/o her throat is swollen, states she is wheezing.  States she has been itching for a couple weeks.  Has rash/excoration on both arms, belly, and shoulders.  Took 2 benadryl this am.  No wheezes heard on auscultation, no stridor, drooling noted.  No muffled speech.

## 2015-05-28 NOTE — ED Provider Notes (Signed)
CSN: [id number]     Arrival date & time 05/28/15  1017 History   First MD Initiated Contact with Patient 05/28/15 1023     Chief Complaint  Patient presents with  . Pruritis  . Oral Swelling     (Consider location/radiation/quality/duration/timing/severity/associated sxs/prior Treatment) HPI  81 year old female presents with itching that started this morning. It started around 5 or 6 AM. Took Benadryl and it seemed to improve but then got worse and now she feels like her throat/neck is swollen. The patient is not having any trouble speaking, controlling her saliva, swallowing, or with shortness of breath. The patient mostly has itching in her lower abdomen and bilateral arms. She states this is a little worse than typical but she's been having rash and itching problems for several months. She is currently on triamcinolone and has been taking topical Benadryl. She does not know of any new medications for the last couple days or any new foods that could've caused this. Denies tongue or lip swelling.  Past Medical History  Diagnosis Date  . Atherosclerotic heart disease native coronary artery w/angina pectoris     a. 1992 s/p PTCA of LAD;  b. 1998 CABG x 3 (VG->OM, VG->D1, LIMA->LAD); c. 07/2001 Cath: Sev LM/LAD/LCX dzs, 3/3 patent grafts; d. 03/2013 Myoview: Apical scar w/o ischemia, nl EF;  e. 11/2013 Cath: Known LM/LAD/LCX dzs, VG-OM1 nl, VG->D1 50, LIMA->LAD nl, RCA nl-->Med Rx.;  . Hypertension   . PAD (peripheral artery disease)     a. severe calcified pop and tibial peroneal trunk disease bilat;  b . 05/2013 PTA R Pop, DES to R Peroneal;  c. 09/2013 PTA/Stenting of L Pop (G. Andree Elk);  d. 04/2014 ABI's: R/L: 1.1/1.1.  Marland Kitchen Hyperlipemia   . DM (diabetes mellitus), type 2 with peripheral vascular complications     On oral medications  . GERD (gastroesophageal reflux disease)   . Claudication, lifestyle limiting 05/16/2013  . S/P angioplasty with stent, 05/15/13, Diamondback rot. atherectomy of high  grade segmental popliteal stenosis then Chocolate balloonPTA; then angiosculpt PTA of the prox peroneal with stent-Xpedition  05/16/2013  . Bradycardia, mild briefly to the 40s, asymptomatic 05/16/2013  . Peptic ulcer disease   . Chronic anemia   . Carotid arterial disease     a. 05/2014 Carotid U/S: No signif bilat ICA stenosis, >60% L ECA.  . H/O echocardiogram     a. 02/2011 Echo: EF 50-55%, mild inf HK.   Past Surgical History  Procedure Laterality Date  . Cardiac catheterization  07/17/01    2 v CAD with LM, circ, LAD, obtuse diag, mod stenosis of diag vein graft , mild stenosis of marg vein graft, nl EF, medical treatment  . Coronary angioplasty  1992    PCI to LAD  . Coronary artery bypass graft  1998    LIMA-LAD, SVG-diagonal (none roughly 50% stenosis), SVG-OM  . Tubal ligation    . Back surgery    . Peroneal artery stent  05/15/13    Diamondback rot. atherectomy of high grade segmental popliteal stenosis then Chocolate balloonPTA; then angiosculpt PTA of the prox peroneal with stent-Xpedition   . Pv angio  07/20/13    high-grade calcified popliteal disease with one-vessel runoff via a small anterior tibial artery that has proximal disease as well, no intervention  . Cardiac catheterization  11/2013    Widely patent LIMA-LAD & SVG-OM with mild progression of proximal stenosis ~40-50% in SVG-D1; Widely patent native RCA with known severe native LCA disease  .  Transthoracic echocardiogram  March 2012    Mild inferior-apical hypokinesis; EF 50-55%. Aortic sclerosis. PA pressure 30-40 mmHg.  Marland Kitchen Lower extremity angiogram N/A 02/22/2013    Procedure: LOWER EXTREMITY ANGIOGRAM;  Surgeon: Lorretta Harp, MD;  Location[facility] CATH LAB;  Service: Cardiovascular;  Laterality: N/A;  . Atherectomy Right 05/15/2013    Procedure: ATHERECTOMY;  Surgeon: Lorretta Harp, MD;  Location: [facility] CATH LAB;  Service: Cardiovascular;  Laterality: Right;  popliteal  . Percutaneous stent intervention Right 05/15/2013     Procedure: PERCUTANEOUS STENT INTERVENTION;  Surgeon: Lorretta Harp, MD;  Location: [facility] CATH LAB;  Service: Cardiovascular;  Laterality: Right;  tibioperoneal trunk  . Lower extremity angiogram N/A 07/20/2013    Procedure: LOWER EXTREMITY ANGIOGRAM;  Surgeon: Lorretta Harp, MD;  Location: [facility] CATH LAB;  Service: Cardiovascular;  Laterality: N/A;  . Left heart catheterization with coronary angiogram N/A 11/27/2013    Procedure: LEFT HEART CATHETERIZATION WITH CORONARY ANGIOGRAM;  Surgeon: [name], MD;  Location: [facility] CATH LAB;  Service: Cardiovascular;  Laterality: N/A;  . Nm myoview ltd  04/03/2013    Lower extremity. Apical perfusion defect with no ischemia. Consider prior infarct versus breast attenuation.   Family History  Problem Relation Age of Onset  . Hypertension Mother   . Hyperlipidemia Mother   . Hyperlipidemia Father   . Hypertension Father    History  Substance Use Topics  . Smoking status: Never Smoker   . Smokeless tobacco: Never Used  . Alcohol Use: No     Comment: rare glass of wine    OB History    No data available     Review of Systems  Constitutional: Negative for fever.  HENT: Negative for trouble swallowing and voice change.   Respiratory: Negative for choking and shortness of breath.   Gastrointestinal: Negative for vomiting.  Skin: Positive for rash.  All other systems reviewed and are negative.     Allergies  Fish allergy; Iohexol; Ivp dye; Latex; Shellfish allergy; Shellfish-derived products; Glucophage; Metformin; and Other  Home Medications   Prior to Admission medications   Medication Sig Start Date End Date Taking? Authorizing Provider  acetaminophen (TYLENOL) 500 MG tablet Take 1,000 mg by mouth every 6 (six) hours as needed.    Historical Provider, MD  Alirocumab 75 MG/ML SOPN Inject 75 mg into the skin. 2 times a month    Historical Provider, MD  amLODipine (NORVASC) 10 MG tablet Take 2.5 mg by mouth daily.    Historical  Provider, MD  aspirin 81 MG tablet Take 81 mg by mouth daily as needed for pain.     Historical Provider, MD  benzonatate (TESSALON) 200 MG capsule Take 1 capsule by mouth 3 (three) times daily as needed. 03/25/15   Historical Provider, MD  carvedilol (COREG) 6.25 MG tablet Take 6.25 mg by mouth 2 (two) times daily with a meal.    Historical Provider, MD  clobetasol (TEMOVATE) 0.05 % external solution Apply 1 application topically 2 (two) times daily as needed. 03/03/15   Historical Provider, MD  clopidogrel (PLAVIX) 75 MG tablet Take 75 mg by mouth daily.    Historical Provider, MD  Coenzyme Q10 (CO Q 10 PO) Take 1 tablet by mouth daily.     Historical Provider, MD  estradiol (ESTRACE) 1 MG tablet Take 1 mg by mouth daily.    Historical Provider, MD  ezetimibe (ZETIA) 10 MG tablet Take 10 mg by mouth daily.    Historical Provider, MD  glimepiride (AMARYL) 4 MG tablet Take 4 mg by mouth daily before breakfast.    Historical Provider, MD  isosorbide mononitrate (IMDUR) 30 MG 24 hr tablet Take 1 tablet (30 mg total) by mouth daily. 02/03/15   [name], MD  l-methylfolate-B6-B12 [facility]) 3-35-2 MG TABS Take 1 tablet by mouth 2 (two) times daily. 02/11/15   Richard Blenda Mounts, DPM  LANTUS SOLOSTAR 100 UNIT/ML Solostar Pen Inject 10 Units as directed daily. 05/09/15   Historical Provider, MD  LORazepam (ATIVAN) 0.5 MG tablet Take 0.5 mg by mouth every 6 (six) hours as needed for anxiety.     Historical Provider, MD  Multiple Vitamins-Minerals (CENTRUM SILVER PO) Take 1 tablet by mouth daily.    Historical Provider, MD  nitroGLYCERIN (NITROSTAT) 0.4 MG SL tablet Place 0.4 mg under the tongue every 5 (five) minutes as needed for chest pain.    Historical Provider, MD  pantoprazole (PROTONIX) 40 MG tablet TAKE 1 TABLET BY MOUTH DAILY 04/08/15   [name], MD  pregabalin (LYRICA) 75 MG capsule Take 75 mg by mouth at bedtime.    Historical Provider, MD  rosuvastatin (CRESTOR) 20 MG tablet Take 20 mg by  mouth every evening.     Historical Provider, MD  saxagliptin HCl (ONGLYZA) 5 MG TABS tablet Take 5 mg by mouth daily.    Historical Provider, MD   Pulse 75  Temp(Src) 97.9 F (36.6 C) (Axillary)  Resp 18  SpO2 100% Physical Exam  Constitutional: She is oriented to person, place, and time. She appears well-developed and well-nourished. No distress.  HENT:  Head: Normocephalic and atraumatic.  Right Ear: External ear normal.  Left Ear: External ear normal.  Nose: Nose normal.  No appreciable tongue, lip, oropharyngeal swelling.  Eyes: Right eye exhibits no discharge. Left eye exhibits no discharge.  Neck: Normal range of motion. Neck supple. No spinous process tenderness and no muscular tenderness present. No rigidity.  Cardiovascular: Normal rate, regular rhythm and normal heart sounds.   Pulmonary/Chest: Effort normal and breath sounds normal. No stridor. She has no wheezes.  Abdominal: Soft. She exhibits no distension. There is no tenderness.  Lymphadenopathy:    She has no cervical adenopathy.  Neurological: She is alert and oriented to person, place, and time.  Skin: Skin is warm and dry. Rash noted. She is not diaphoretic.  Chronic excoriations to bilateral lower arms. Mild fine maculopapular rash to right shoulder  Nursing note and vitals reviewed.   ED Course  Procedures (including critical care time) Labs Review Labs Reviewed  COMPREHENSIVE METABOLIC PANEL - Abnormal; Notable for the following:    Glucose, Bld 139 (*)    All other components within normal limits  CBC WITH DIFFERENTIAL/PLATELET - Abnormal; Notable for the following:    Eosinophils Relative 6 (*)    All other components within normal limits    Imaging Review No results found.   EKG Interpretation None      MDM   Final diagnoses:  Rash and nonspecific skin eruption    Patient's rash has been on and off for months, feels much improved with benadryl. Solumedrol given for the neck swelling  sensation, but there is no sign of external or oropharyngeal swelling. She speaks with a normal voice, no drooling, stridor or wheezing. While in ED she had improvement of symptoms and no airway issues. She now feels like she's getting a sore throat, could be developing a URI but no signs of airway swelling or airway concern. Stable  for d/c home, f/u with PCP    Sherwood Gambler, MD 05/28/15 1714

## 2015-06-03 DIAGNOSIS — L270 Generalized skin eruption due to drugs and medicaments taken internally: Secondary | ICD-10-CM | POA: Diagnosis not present

## 2015-06-03 DIAGNOSIS — N390 Urinary tract infection, site not specified: Secondary | ICD-10-CM | POA: Diagnosis not present

## 2015-06-03 DIAGNOSIS — E118 Type 2 diabetes mellitus with unspecified complications: Secondary | ICD-10-CM | POA: Diagnosis not present

## 2015-06-04 DIAGNOSIS — Z0100 Encounter for examination of eyes and vision without abnormal findings: Secondary | ICD-10-CM | POA: Diagnosis not present

## 2015-06-04 DIAGNOSIS — E10331 Type 1 diabetes mellitus with moderate nonproliferative diabetic retinopathy with macular edema: Secondary | ICD-10-CM | POA: Diagnosis not present

## 2015-06-04 DIAGNOSIS — Z961 Presence of intraocular lens: Secondary | ICD-10-CM | POA: Diagnosis not present

## 2015-06-12 ENCOUNTER — Ambulatory Visit (INDEPENDENT_AMBULATORY_CARE_PROVIDER_SITE_OTHER): Payer: Medicare Other | Admitting: Cardiology

## 2015-06-12 ENCOUNTER — Encounter: Payer: Self-pay | Admitting: Cardiology

## 2015-06-12 VITALS — BP 168/90 | HR 62 | Ht 62.0 in | Wt 176.7 lb

## 2015-06-12 DIAGNOSIS — E669 Obesity, unspecified: Secondary | ICD-10-CM

## 2015-06-12 DIAGNOSIS — R0609 Other forms of dyspnea: Secondary | ICD-10-CM | POA: Diagnosis not present

## 2015-06-12 DIAGNOSIS — I25119 Atherosclerotic heart disease of native coronary artery with unspecified angina pectoris: Secondary | ICD-10-CM | POA: Diagnosis not present

## 2015-06-12 DIAGNOSIS — I208 Other forms of angina pectoris: Secondary | ICD-10-CM

## 2015-06-12 DIAGNOSIS — R0600 Dyspnea, unspecified: Secondary | ICD-10-CM

## 2015-06-12 DIAGNOSIS — E785 Hyperlipidemia, unspecified: Secondary | ICD-10-CM | POA: Diagnosis not present

## 2015-06-12 DIAGNOSIS — I10 Essential (primary) hypertension: Secondary | ICD-10-CM

## 2015-06-12 DIAGNOSIS — I739 Peripheral vascular disease, unspecified: Secondary | ICD-10-CM

## 2015-06-12 DIAGNOSIS — I209 Angina pectoris, unspecified: Secondary | ICD-10-CM

## 2015-06-12 MED ORDER — TELMISARTAN 20 MG PO TABS: 20.0000 mg | ORAL_TABLET | Freq: Every day | ORAL | Status: DC

## 2015-06-12 MED ORDER — ISOSORBIDE MONONITRATE ER 60 MG PO TB24: 60.0000 mg | ORAL_TABLET | Freq: Every day | ORAL | Status: DC

## 2015-06-12 MED ORDER — ISOSORBIDE MONONITRATE ER 30 MG PO TB24: 90.0000 mg | ORAL_TABLET | Freq: Every day | ORAL | Status: DC

## 2015-06-12 NOTE — Patient Instructions (Addendum)
TAKE ISOSORBIDE MONO- (IMDUR)- 90 MG  DAILY ( THIS IS AN INCREASE FROM 30 MG TABLET ) ( YOU MAY 3TABLET OF 30 MG TABLETS UNTIL BOTTLE IS EMPTY.  START MICARDIS ( TELMISARTAN)- 20 MG ONE TABLET  AT BEDTIME.   Your physician recommends that you schedule a follow-up appointment in 2-3 MONTHS WITH AN EXTENDER.     Your physician wants you to follow-up in   [place]. You will receive a reminder letter in the mail two months in advance. If you don't receive a letter, please call our office to schedule the follow-up appointment.

## 2015-06-12 NOTE — Progress Notes (Signed)
PCP: Dwan Bolt, MD  Clinic Note: Chief Complaint  Patient presents with  . Follow-up      chest pain-has been having pressure and tightness in chest, has been getting tired very easliy, shortness of breath-having a lot more frequent wheezing, has edema in left leg and ankle, has pain in left leg, no cramping in legs, daily-lightheadedness, dialy- dizziness  . Coronary Artery Disease    Severe native vessel disease status post CABG x3  . PAD    Extensive peripheral angioplasty   HPI: [name] is a 81 y.o. female with a PMH below who presents today for a 53 month old CAD and PAD.  She was also noted to have above-the-knee popliteal stenosis - has been treated by Dr. Quay Burow - last visit 6/1. - arterial dopplers ordered (not yet done)  Had Flu in [place].   PCP started Repatha - had rash; now changed to Praluent - now off again due to rash  Last visit with me 5/9: c/o DOE & exercise intolerance with atypical CP,  also L Leg swelling foot-knee. 3 studies ordered - results reviewed & PMH/PSH updated.   LE Venous dopplers 5/31:   Echo 6/1: LV cavity size was normal. Wall thickness was increased in a pattern of mild LVH. Systolic function was normal.  The estimated ejection fraction was in the range of 60% to 65%.  Doppler parameters are consistent with abnormal left ventricular relaxation (grade 1 diastolic dysfunction).   Myoview 05/08/15: LOW RISK - NORMAL, EF 55-65%, no ischemia or infarction:   ER visit 6/15 --> itching / rash.  Neck swelling. -- Solumedrol & benadryl - improved. ?? Not sure of etiology. -- still has rash, but looks to be in process of healing.  Past Medical History  Diagnosis Date  . Atherosclerotic heart disease native coronary artery w/angina pectoris 1992    a. 1992 s/p PTCA of LAD;  b. 1998 CABG x 3; c. 07/2001 Cath: Sev LM/LAD/LCX dzs, 3/3 patent grafts; d. 11/2013 Cath: Known LM/LAD/LCX dzs, VG-OM1 nl, VG->D1 50, LIMA->LAD nl, RCA  nl-->Med Rx.;;;e. MYOVIEW 04/2015: NO ISCHEMIA OR INFARCTION, NORMAL EF  . S/P CABG x 3 1998    LIMA-LAD, SVG-OM, SVG-D1  . Severe claudication 05/2013    Referred for Peripheral Angio (Dr. Gwenlyn Found --> Dr. Brunetta Jeans in [place])  . PAD (peripheral artery disease) 05/2013; 09/2013    a. severe calcified POP-1 & TP Trunk Dz bilat;  b . 05/2013 Diamondback Rot Athrectomy (R POP) --> PTA R Pop, DES to R Peroneal;  c. 09/2013 PTA/Stenting of L Pop [name] - retrograde access);  d. 04/2014 ABI's: R/L: 1.1/1.1.  . Carotid arterial disease     a. 05/2014 Carotid U/S: No signif bilat ICA stenosis, >60% L ECA.  . DM (diabetes mellitus), type 2 with peripheral vascular complications     On Oral medications.  . Bradycardia, mild briefly to the 40s, asymptomatic 05/16/2013  . Essential hypertension   . Hyperlipidemia with target LDL less than 70   . GERD (gastroesophageal reflux disease)   . Peptic ulcer disease   . Chronic anemia     Prior Cardiac Evaluation and Past Surgical History: Past Surgical History  Procedure Laterality Date  . Cardiac catheterization  07/17/01    2 v CAD with LM, circ, LAD, obtuse diag, mod stenosis of diag vein graft , mild stenosis of marg vein graft, nl EF, medical treatment  . Coronary angioplasty  1992    PCI  to LAD  . Coronary artery bypass graft  1998    LIMA-LAD, SVG-diagonal (none roughly 50% stenosis), SVG-OM  . Tubal ligation    . Back surgery    . Peroneal artery stent  05/15/13    Diamondback rot. atherectomy of high grade segmental popliteal stenosis then Chocolate balloonPTA; then angiosculpt PTA of the prox peroneal with stent-Xpedition   . Pv angio  07/20/13    high-grade calcified popliteal disease with one-vessel runoff via a small anterior tibial artery that has proximal disease as well, no intervention  . Cardiac catheterization  11/2013    Widely patent LIMA-LAD & SVG-OM with mild progression of proximal stenosis ~40-50% in SVG-D1; Widely patent native RCA with  known severe native LCA disease  . Transthoracic echocardiogram  March 2012    Mild inferior-apical hypokinesis; EF 50-55%. Aortic sclerosis. PA pressure 30-40 mmHg.  Marland Kitchen Lower extremity angiogram N/A 02/22/2013    Procedure: LOWER EXTREMITY ANGIOGRAM;  Surgeon: Lorretta Harp, MD;  Location: [facility] CATH LAB;  Service: Cardiovascular;  Laterality: N/A;  . Atherectomy Right 05/15/2013    Procedure: ATHERECTOMY;  Surgeon: Lorretta Harp, MD;  Location: [facility] CATH LAB;  Service: Cardiovascular;  Laterality: Right;  popliteal  . Percutaneous stent intervention Right 05/15/2013    Procedure: PERCUTANEOUS STENT INTERVENTION;  Surgeon: Lorretta Harp, MD;  Location: [facility] CATH LAB;  Service: Cardiovascular;  Laterality: Right;  tibioperoneal trunk  . Lower extremity angiogram N/A 07/20/2013    Procedure: LOWER EXTREMITY ANGIOGRAM;  Surgeon: Lorretta Harp, MD;  Location: [facility] CATH LAB;  Service: Cardiovascular;  Laterality: N/A;  . Left heart catheterization with coronary angiogram N/A 11/27/2013    Procedure: LEFT HEART CATHETERIZATION WITH CORONARY ANGIOGRAM;  Surgeon: [name], MD;  Location: [facility] CATH LAB;  Service: Cardiovascular;  Laterality: N/A;  . Nm myoview ltd  04/03/2013; 5/26'/2016    Lexiscan: a)  Apical perfusion defect with no ischemia. Consider prior infarct versus breast attenuation.;; b) 5/'16: LOW RISK, Nl EF ~55-65%, No Ischemia /Infarction  . Transthoracic echocardiogram  02/2011; 04/2015    a) EF 50-55%, mild inf HK;; b)  Nl LV Size & Fxn EF 60-65%. mild LVH, Gr 1 DD.   Interval History: [name] presents for a one-month followup to review the results of her echocardiogram and Myoview. Essentially both of these studies were nonrevealing as far as any potential etiology for her exercise intolerance and dyspnea. Negative Myoview for ischemia and relatively normal echocardiogram. Diastolic function is relatively normal. To evaluate her leg swelling, which no evidence of DVT in the left  leg.  Overall she says she still been having this chest pressure and tightness off and on the chest and gets tired easily. She has been trying to go for walks more frequently, but her husband is never willing to go with her. When she does walk she really noticed chest tightness associated with exertion. Oftentimes the chest discomfort she has is at rest or at night when she tried to lay down. She occasionally however when walking on a treadmill. The most concerning thing for her of late was this rash that she has mostly on the right greater than left arm but it goes on to the back as well as legs. Although she told me she is having swelling in the right leg, it she noted to the check and nurse it is her left leg and ankle. She has pain in her leg that seems more musculoskeletal in nature.  As was noted before,  she has not used any nitroglycerin for discomfort in her chest. She continues to state that the Episodes just come on - not necessarily associated with exertion.the symptoms lasts several minutes and are usually relieved with taking aspirin. She does note exertional dyspnea and mild claudication with exertion. Gets tired quicker than she used to & gets more SOB with exertion -- but is not as active exercising as much as she used to.  She denies any PND, orthopnea but does have right leg swelling. Not bilateral edema - an alternate from last visit.  She seems to fully recovered from the flu., but still notes intermittently feeling lightheaded or dizzy during the day. Slowly but finally on occasions when she first wakes up.   No weakness or syncope/near syncope. No TIA/amaurosis fugax symptoms. No melena, hematochezia, hematuria, or epstaxis.   ROS: A comprehensive Review of Systems - was performed Review of Systems  Constitutional: Positive for malaise/fatigue (just feels tired.).  HENT: Negative for nosebleeds.   Respiratory: Positive for shortness of breath (With exertion).        Recent  flu  Cardiovascular: Positive for claudication and leg swelling (Left leg).  Gastrointestinal: Positive for nausea (since before flu. -- PCP Guaiac negative ) and abdominal pain. Negative for blood in stool and melena.       Black stool  Genitourinary: Negative for hematuria.  Musculoskeletal: Positive for back pain, joint pain (The pain radiates from behind her left knee up to her left inguinal area.) and neck pain.       L Leg behind the knee hurts.  Skin: Positive for rash (paular rash on arms, back & buttock-legs; Dermatologist did biopsy).  Neurological: Positive for dizziness (positional).  All other systems reviewed and are negative.    Allergies  Allergen Reactions  . Fish Allergy Hives and Shortness Of Breath  . Iohexol Shortness Of Breath  . Ivp Dye [Iodinated Diagnostic Agents] Shortness Of Breath  . Latex Hives, Shortness Of Breath and Itching    REACTION: wheezing  . Shellfish Allergy Hives and Shortness Of Breath    All seafood  . Shellfish-Derived Products Anaphylaxis and Hives  . Glucophage [Metformin Hcl] Other (See Comments)    Instructed by physician not to take anymore   . Metformin Nausea And Vomiting  . Other     Patient reports allergy to perfumed detergents. Causes itching.    Current Outpatient Prescriptions on File Prior to Visit  Medication Sig Dispense Refill  . acetaminophen (TYLENOL) 500 MG tablet Take 1,000 mg by mouth every 6 (six) hours as needed.    . clobetasol (TEMOVATE) 0.05 % external solution Apply 1 application topically 2 (two) times daily as needed.  3  . clopidogrel (PLAVIX) 75 MG tablet Take 75 mg by mouth daily.    . Coenzyme Q10 (CO Q 10 PO) Take 1 tablet by mouth daily.     Marland Kitchen estradiol (ESTRACE) 1 MG tablet Take 1 mg by mouth daily.    Marland Kitchen ezetimibe (ZETIA) 10 MG tablet Take 10 mg by mouth daily.    Marland Kitchen glimepiride (AMARYL) 4 MG tablet Take 4 mg by mouth daily before breakfast.    . LANTUS SOLOSTAR 100 UNIT/ML Solostar Pen Inject 10  Units as directed daily at 12 noon.   5  . LORazepam (ATIVAN) 0.5 MG tablet Take 0.5 mg by mouth every 6 (six) hours as needed for anxiety.     . Multiple Vitamins-Minerals (CENTRUM SILVER PO) Take 1 tablet by mouth daily.    Marland Kitchen  nitroGLYCERIN (NITROSTAT) 0.4 MG SL tablet Place 0.4 mg under the tongue every 5 (five) minutes as needed for chest pain.    . pantoprazole (PROTONIX) 40 MG tablet TAKE 1 TABLET BY MOUTH DAILY 30 tablet 4  . predniSONE (DELTASONE) 20 MG tablet Take 2 tablets (40 mg total) by mouth daily. 8 tablet 0  . rosuvastatin (CRESTOR) 20 MG tablet Take 20 mg by mouth every evening.     . saxagliptin HCl (ONGLYZA) 5 MG TABS tablet Take 5 mg by mouth daily.     No current facility-administered medications on file prior to visit.   History  Substance Use Topics  . Smoking status: Never Smoker   . Smokeless tobacco: Never Used  . Alcohol Use: No     Comment: rare glass of wine    Family History  Problem Relation Age of Onset  . Hypertension Mother   . Hyperlipidemia Mother   . Hyperlipidemia Father   . Hypertension Father     Wt Readings from Last 3 Encounters:  06/12/15 80.151 kg (176 lb 11.2 oz)  05/14/15 79.969 kg (176 lb 4.8 oz)  04/21/15 80.332 kg (177 lb 1.6 oz)   PHYSICAL EXAM BP 168/90 mmHg  Pulse 62  Ht 5\' 2"  (1.575 m)  Wt 80.151 kg (176 lb 11.2 oz)  BMI 32.31 kg/m2 General appearance: alert, cooperative, appears stated age, no distress and mildly obese  Neck: no adenopathy, no carotid bruit and no JVD  Lungs: clear to auscultation bilaterally, normal percussion bilaterally and non-labored  Heart: RRR, normal S1, S2, no murmur, click, rub or gallop; nondisplaced PMI  Abdomen: soft, non-tender; bowel sounds normal; no masses, no organomegaly; mild obesity  Extremities: extremities normal, atraumatic, no cyanosis, or edema;There is some mild bruising on the left wrist.  Pulses: 2+ and symmetric upper extremities. Still has diminished pedal pulses, but  stable.;  Neurologic: Mental status: Alert, oriented, thought content appropriate; Cranial nerves: normal (II-XII grossly intact)   Adult ECG Report  Rate: 62;  Rhythm: normal sinus rhythm ; RSR', anteroseptal T wave inversions, cannot rule out ischemia.  Narrative Interpretation: No notable changes from past EKG; normal axis and intervals.  Recent Labs:   PCP check lipids in May - Not available.   Chemistry      Component Value Date/Time   NA 138 05/28/2015 1041   K 4.4 05/28/2015 1041   CL 102 05/28/2015 1041   CO2 27 05/28/2015 1041   BUN 11 05/28/2015 1041   CREATININE 0.86 05/28/2015 1041   CREATININE 1.07 07/16/2013 1251      Component Value Date/Time   CALCIUM 9.4 05/28/2015 1041   ALKPHOS 43 05/28/2015 1041   AST 23 05/28/2015 1041   ALT 16 05/28/2015 1041   BILITOT 0.3 05/28/2015 1041      ASSESSMENT / PLAN: Problem List Items Addressed This Visit    Angina, class II    Most likely microvascular ischemia - with negative Myoview Tolerating increased dose of carvedilol. Increasing Imdur and initiating ARB      Relevant Medications   furosemide (LASIX) 20 MG tablet   carvedilol (COREG) 3.125 MG tablet   telmisartan (MICARDIS) 20 MG tablet   isosorbide mononitrate (IMDUR) 30 MG 24 hr tablet   Atherosclerotic heart disease of native coronary artery with angina pectoris - Primary (Chronic)    Stress test suggesting microvascular ischemia. She probably still has microvascular ischemia. Medications listed below. Initiating the Micardis 20 mg daily today for afterload reduction --> reduce wall  stress mediated angina. Increased Imdur to 90 mg daily      Relevant Medications   furosemide (LASIX) 20 MG tablet   carvedilol (COREG) 3.125 MG tablet   telmisartan (MICARDIS) 20 MG tablet   isosorbide mononitrate (IMDUR) 30 MG 24 hr tablet   Other Relevant Orders   EKG 12-Lead (Completed)   DOE (dyspnea on exertion) (Chronic)    Nonischemic Lexiscan Myoview which would  argue against macrovascular CAD however cannot exclude microvascular disease and she is diabetic with hypertension and hyperlipidemia. Normal EF and valve function by Echo.  Only grade 1 diastolic function was, tongue. However with poorly controlled hypertension this could be enough for some exertional dyspnea. The most likely however is deconditioning. Encourage continued exercise.      Relevant Orders   EKG 12-Lead (Completed)   Dyslipidemia, goal LDL below 70 (Chronic)    On Zetia. Intolerant of her Pap and Praluent. This is being treated by Dr. Debroah Baller (Endo).  Unfortunately I don't have any labs.  Crestor is listed as a medication, not sure she is actually taking it.      Relevant Medications   furosemide (LASIX) 20 MG tablet   carvedilol (COREG) 3.125 MG tablet   telmisartan (MICARDIS) 20 MG tablet   isosorbide mononitrate (IMDUR) 30 MG 24 hr tablet   Other Relevant Orders   EKG 12-Lead (Completed)   Essential hypertension (Chronic)    Still poorly controlled today.  Last visit I increased carvedilol to 625 mg twice a day. Heart rate is 62 which precludes further titration for now. Plan: Start ARB with my card is 20 mg daily. Hopefully we'll titrate up for better control and afterload reduction      Relevant Medications   furosemide (LASIX) 20 MG tablet   carvedilol (COREG) 3.125 MG tablet   telmisartan (MICARDIS) 20 MG tablet   isosorbide mononitrate (IMDUR) 30 MG 24 hr tablet   Obesity (BMI 30-39.9) (Chronic)    Thankfully, she is at a stable weight over the last month. We talked about dietary modifications but also the importance of increasing her exercise.      PAD,severe calcified pop and tibial peroneal trunk disease bilateraly (Chronic)    Followed by Dr. Gwenlyn Found. Left leg pain does not sound like claudication and her pulses are palpable. Venous Doppler showed no evidence of thrombosis. Dr. Gwenlyn Found ordered lower extremity arterial Dopplers in late May: showed mild reduction  in ABIs but relatively stable flow. --> most likely musculoskeletal pain.      Relevant Medications   furosemide (LASIX) 20 MG tablet   carvedilol (COREG) 3.125 MG tablet   telmisartan (MICARDIS) 20 MG tablet   isosorbide mononitrate (IMDUR) 30 MG 24 hr tablet     Complex, difficult visit. She is not a very good historian overall time the patient was close to 60 minutes. We discussed treatment options as well as her multiple medical problems.  Followup: ~ 2-3 months with an APP for BP evaluation & Titration of ARB; 6 month ROV with me.   [name], M.D., M.S. Interventional Cardiologist   Pager # [phone]

## 2015-06-18 ENCOUNTER — Encounter: Payer: Self-pay | Admitting: Cardiology

## 2015-06-18 NOTE — Assessment & Plan Note (Addendum)
Stress test suggesting microvascular ischemia. She probably still has microvascular ischemia. Medications listed below. Initiating the Micardis 20 mg daily today for afterload reduction --> reduce wall stress mediated angina. Increased Imdur to 90 mg daily

## 2015-06-18 NOTE — Assessment & Plan Note (Signed)
Nonischemic Lexiscan Myoview which would argue against macrovascular CAD however cannot exclude microvascular disease and she is diabetic with hypertension and hyperlipidemia. Normal EF and valve function by Echo.  Only grade 1 diastolic function was, tongue. However with poorly controlled hypertension this could be enough for some exertional dyspnea. The most likely however is deconditioning. Encourage continued exercise.

## 2015-06-18 NOTE — Assessment & Plan Note (Signed)
On Zetia. Intolerant of her Pap and Praluent. This is being treated by Dr. Debroah Baller (Endo).  Unfortunately I don't have any labs.  Crestor is listed as a medication, not sure she is actually taking it.

## 2015-06-18 NOTE — Assessment & Plan Note (Signed)
Followed by Dr. Gwenlyn Found. Left leg pain does not sound like claudication and her pulses are palpable. Venous Doppler showed no evidence of thrombosis. Dr. Gwenlyn Found ordered lower extremity arterial Dopplers in late May: showed mild reduction in ABIs but relatively stable flow. --> most likely musculoskeletal pain.

## 2015-06-18 NOTE — Assessment & Plan Note (Signed)
Still poorly controlled today.  Last visit I increased carvedilol to 625 mg twice a day. Heart rate is 62 which precludes further titration for now. Plan: Start ARB with my card is 20 mg daily. Hopefully we'll titrate up for better control and afterload reduction

## 2015-06-18 NOTE — Assessment & Plan Note (Signed)
Thankfully, she is at a stable weight over the last month. We talked about dietary modifications but also the importance of increasing her exercise.

## 2015-06-18 NOTE — Assessment & Plan Note (Signed)
Most likely microvascular ischemia - with negative Myoview Tolerating increased dose of carvedilol. Increasing Imdur and initiating ARB

## 2015-06-25 DIAGNOSIS — E789 Disorder of lipoprotein metabolism, unspecified: Secondary | ICD-10-CM | POA: Diagnosis not present

## 2015-06-25 DIAGNOSIS — E118 Type 2 diabetes mellitus with unspecified complications: Secondary | ICD-10-CM | POA: Diagnosis not present

## 2015-07-02 DIAGNOSIS — E118 Type 2 diabetes mellitus with unspecified complications: Secondary | ICD-10-CM | POA: Diagnosis not present

## 2015-07-02 DIAGNOSIS — E789 Disorder of lipoprotein metabolism, unspecified: Secondary | ICD-10-CM | POA: Diagnosis not present

## 2015-07-02 DIAGNOSIS — I10 Essential (primary) hypertension: Secondary | ICD-10-CM | POA: Diagnosis not present

## 2015-07-09 DIAGNOSIS — B86 Scabies: Secondary | ICD-10-CM | POA: Diagnosis not present

## 2015-07-17 DIAGNOSIS — I10 Essential (primary) hypertension: Secondary | ICD-10-CM | POA: Diagnosis not present

## 2015-07-17 DIAGNOSIS — E1165 Type 2 diabetes mellitus with hyperglycemia: Secondary | ICD-10-CM | POA: Diagnosis not present

## 2015-07-17 DIAGNOSIS — L299 Pruritus, unspecified: Secondary | ICD-10-CM | POA: Diagnosis not present

## 2015-07-17 DIAGNOSIS — E789 Disorder of lipoprotein metabolism, unspecified: Secondary | ICD-10-CM | POA: Diagnosis not present

## 2015-08-01 ENCOUNTER — Ambulatory Visit: Payer: Medicare Other | Admitting: Podiatry

## 2015-08-25 DIAGNOSIS — E789 Disorder of lipoprotein metabolism, unspecified: Secondary | ICD-10-CM | POA: Diagnosis not present

## 2015-08-25 DIAGNOSIS — L249 Irritant contact dermatitis, unspecified cause: Secondary | ICD-10-CM | POA: Diagnosis not present

## 2015-08-25 DIAGNOSIS — E118 Type 2 diabetes mellitus with unspecified complications: Secondary | ICD-10-CM | POA: Diagnosis not present

## 2015-09-12 ENCOUNTER — Other Ambulatory Visit: Payer: Self-pay | Admitting: Cardiology

## 2015-09-12 NOTE — Telephone Encounter (Signed)
Rx request sent to pharmacy.  

## 2015-10-01 DIAGNOSIS — E118 Type 2 diabetes mellitus with unspecified complications: Secondary | ICD-10-CM | POA: Diagnosis not present

## 2015-10-02 DIAGNOSIS — L309 Dermatitis, unspecified: Secondary | ICD-10-CM | POA: Diagnosis not present

## 2015-10-02 DIAGNOSIS — L259 Unspecified contact dermatitis, unspecified cause: Secondary | ICD-10-CM | POA: Diagnosis not present

## 2015-10-02 DIAGNOSIS — L280 Lichen simplex chronicus: Secondary | ICD-10-CM | POA: Diagnosis not present

## 2015-10-02 DIAGNOSIS — L905 Scar conditions and fibrosis of skin: Secondary | ICD-10-CM | POA: Diagnosis not present

## 2015-10-09 DIAGNOSIS — E118 Type 2 diabetes mellitus with unspecified complications: Secondary | ICD-10-CM | POA: Diagnosis not present

## 2015-10-09 DIAGNOSIS — Z23 Encounter for immunization: Secondary | ICD-10-CM | POA: Diagnosis not present

## 2015-10-09 DIAGNOSIS — E789 Disorder of lipoprotein metabolism, unspecified: Secondary | ICD-10-CM | POA: Diagnosis not present

## 2015-10-09 DIAGNOSIS — L309 Dermatitis, unspecified: Secondary | ICD-10-CM | POA: Diagnosis not present

## 2015-10-16 ENCOUNTER — Ambulatory Visit: Payer: Medicare Other | Admitting: Cardiology

## 2015-10-22 ENCOUNTER — Encounter: Payer: Self-pay | Admitting: Cardiology

## 2015-10-23 DIAGNOSIS — L280 Lichen simplex chronicus: Secondary | ICD-10-CM | POA: Diagnosis not present

## 2015-10-24 ENCOUNTER — Ambulatory Visit (INDEPENDENT_AMBULATORY_CARE_PROVIDER_SITE_OTHER): Payer: Medicare Other | Admitting: Physician Assistant

## 2015-10-24 ENCOUNTER — Encounter: Payer: Self-pay | Admitting: Physician Assistant

## 2015-10-24 VITALS — BP 124/62 | HR 68 | Ht 62.0 in | Wt 176.0 lb

## 2015-10-24 DIAGNOSIS — I739 Peripheral vascular disease, unspecified: Secondary | ICD-10-CM | POA: Diagnosis not present

## 2015-10-24 DIAGNOSIS — I10 Essential (primary) hypertension: Secondary | ICD-10-CM

## 2015-10-24 DIAGNOSIS — E785 Hyperlipidemia, unspecified: Secondary | ICD-10-CM | POA: Insufficient documentation

## 2015-10-24 DIAGNOSIS — E119 Type 2 diabetes mellitus without complications: Secondary | ICD-10-CM | POA: Diagnosis not present

## 2015-10-24 DIAGNOSIS — I209 Angina pectoris, unspecified: Secondary | ICD-10-CM

## 2015-10-24 DIAGNOSIS — I2510 Atherosclerotic heart disease of native coronary artery without angina pectoris: Secondary | ICD-10-CM | POA: Insufficient documentation

## 2015-10-24 HISTORY — DX: Type 2 diabetes mellitus without complications: E11.9

## 2015-10-24 NOTE — Progress Notes (Signed)
Cardiology Office Note   Date:  10/24/2015   ID:  [name], [name] [birth year], MRN [id number]  PCP:  Dwan Bolt, MD  Cardiologist:  Dr Ellyn Hack PV: Dr Stacy Gardner, PA-C   Chief complaint: Medical management  History of Present Illness: [name] is a 81 y.o. female with a history of CABG '98, DM, HTN, HL, nl EF by echo 05/2015, w/ grade 1 dd, nl MV 04/2015 but concern for microvascular ischemia, PAD s/p R peroneal stent by Dr Gwenlyn Found, Dr Andree Elk did PCI L popliteal w/ mildly decreased ABIs 04/2015.  [name] presents for medical management.  In some ways she is doing well. She is not doing very much from the house, but is not having chest pain with exertion. She is concerned because she is not able to afford the Micardis, and has not been taking it for the last 2 weeks. She is able to afford her other medications and is taking them.   She ambulates that she is able, but is not very active. She has been waking up with aching down the inner aspect both arms which will resolve without intervention. She has also been waking up with very low blood sugars, stating one was as low as 14 on her machine. Her primary care physician has discontinued some of her diabetes medications, but it is still happening.  She also has pain in her leg at times, behind her left knee.   Past Medical History  Diagnosis Date  . Atherosclerotic heart disease native coronary artery w/angina pectoris ([place]) 1992    a. 1992 s/p PTCA of LAD;  b. 1998 CABG x 3; c. 07/2001 Cath: Sev LM/LAD/LCX dzs, 3/3 patent grafts; d. 11/2013 Cath: Known LM/LAD/LCX dzs, VG-OM1 nl, VG->D1 50, LIMA->LAD nl, RCA nl-->Med Rx.;;;e. MYOVIEW 04/2015: NO ISCHEMIA OR INFARCTION, NORMAL EF  . S/P CABG x 3 1998    LIMA-LAD, SVG-OM, SVG-D1  . Severe claudication ([place]) 05/2013    Referred for Peripheral Angio (Dr. Gwenlyn Found --> Dr. Brunetta Jeans in [place])  . PAD (peripheral artery disease) ([place]) 05/2013; 09/2013    a. severe  calcified POP-1 & TP Trunk Dz bilat;  b . 05/2013 Diamondback Rot Athrectomy (R POP) --> PTA R Pop, DES to R Peroneal;  c. 09/2013 PTA/Stenting of L Pop [name] - retrograde access);  d. 04/2014 ABI's: R/L: 1.1/1.1.  . Carotid arterial disease ([place])     a. 05/2014 Carotid U/S: No signif bilat ICA stenosis, >60% L ECA.  . DM (diabetes mellitus), type 2 with peripheral vascular complications ([facility])     On Oral medications.  . Bradycardia, mild briefly to the 40s, asymptomatic 05/16/2013  . Essential hypertension   . Hyperlipidemia with target LDL less than 70   . GERD (gastroesophageal reflux disease)   . Peptic ulcer disease   . Chronic anemia     Past Surgical History  Procedure Laterality Date  . Cardiac catheterization  07/17/01    2 v CAD with LM, circ, LAD, obtuse diag, mod stenosis of diag vein graft , mild stenosis of marg vein graft, nl EF, medical treatment  . Coronary angioplasty  1992    PCI to LAD  . Coronary artery bypass graft  1998    LIMA-LAD, SVG-diagonal (none roughly 50% stenosis), SVG-OM  . Tubal ligation    . Back surgery    . Peroneal artery stent  05/15/13    Diamondback rot. atherectomy of high grade segmental popliteal  stenosis then Chocolate balloonPTA; then angiosculpt PTA of the prox peroneal with stent-Xpedition   . Pv angio  07/20/13    high-grade calcified popliteal disease with one-vessel runoff via a small anterior tibial artery that has proximal disease as well, no intervention  . Cardiac catheterization  11/2013    Widely patent LIMA-LAD & SVG-OM with mild progression of proximal stenosis ~40-50% in SVG-D1; Widely patent native RCA with known severe native LCA disease  . Transthoracic echocardiogram  March 2012    Mild inferior-apical hypokinesis; EF 50-55%. Aortic sclerosis. PA pressure 30-40 mmHg.  Marland Kitchen Lower extremity angiogram N/A 02/22/2013    Procedure: LOWER EXTREMITY ANGIOGRAM;  Surgeon: Lorretta Harp, MD;  Location: [facility] CATH LAB;  Service:  Cardiovascular;  Laterality: N/A;  . Atherectomy Right 05/15/2013    Procedure: ATHERECTOMY;  Surgeon: Lorretta Harp, MD;  Location[facility] CATH LAB;  Service: Cardiovascular;  Laterality: Right;  popliteal  . Percutaneous stent intervention Right 05/15/2013    Procedure: PERCUTANEOUS STENT INTERVENTION;  Surgeon: Lorretta Harp, MD;  Location: [facility] CATH LAB;  Service: Cardiovascular;  Laterality: Right;  tibioperoneal trunk  . Lower extremity angiogram N/A 07/20/2013    Procedure: LOWER EXTREMITY ANGIOGRAM;  Surgeon: Lorretta Harp, MD;  Location[facility] CATH LAB;  Service: Cardiovascular;  Laterality: N/A;  . Left heart catheterization with coronary angiogram N/A 11/27/2013    Procedure: LEFT HEART CATHETERIZATION WITH CORONARY ANGIOGRAM;  Surgeon: [name], MD;  Location: [facility] CATH LAB;  Service: Cardiovascular;  Laterality: N/A;  . Nm myoview ltd  04/03/2013; 5/26'/2016    Lexiscan: a)  Apical perfusion defect with no ischemia. Consider prior infarct versus breast attenuation.;; b) 5/'16: LOW RISK, Nl EF ~55-65%, No Ischemia /Infarction  . Transthoracic echocardiogram  02/2011; 04/2015    a) EF 50-55%, mild inf HK;; b)  Nl LV Size & Fxn EF 60-65%. mild LVH, Gr 1 DD.    Current Outpatient Prescriptions  Medication Sig Dispense Refill  . acetaminophen (TYLENOL) 500 MG tablet Take 1,000 mg by mouth every 6 (six) hours as needed.    . carvedilol (COREG) 3.125 MG tablet TAKE 1 TABLET (3.125 MG TOTAL) BY MOUTH 2 (TWO) TIMES DAILY. 180 tablet 0  . clopidogrel (PLAVIX) 75 MG tablet Take 75 mg by mouth daily.    . Coenzyme Q10 (CO Q 10 PO) Take 1 tablet by mouth daily.     Marland Kitchen estradiol (ESTRACE) 1 MG tablet Take 1 mg by mouth daily.    Marland Kitchen ezetimibe (ZETIA) 10 MG tablet Take 10 mg by mouth daily.    . furosemide (LASIX) 20 MG tablet Take 1 tablet by mouth daily. Take 1 tab daily  0  . glimepiride (AMARYL) 4 MG tablet Take 4 mg by mouth daily before breakfast.    . hydrOXYzine (ATARAX/VISTARIL) 25 MG tablet  TAKE 1 TO 2 TABLETS BY MOUTH AT BEDTIME FOR ITCHING  12  . isosorbide mononitrate (IMDUR) 30 MG 24 hr tablet Take 30 mg by mouth 3 (three) times daily.    Marland Kitchen LANTUS SOLOSTAR 100 UNIT/ML Solostar Pen Inject 10 Units as directed daily at 12 noon.   5  . LORazepam (ATIVAN) 0.5 MG tablet Take 0.5 mg by mouth every 6 (six) hours as needed for anxiety.     . Multiple Vitamins-Minerals (CENTRUM SILVER PO) Take 1 tablet by mouth daily.    . nitroGLYCERIN (NITROSTAT) 0.4 MG SL tablet Place 0.4 mg under the tongue every 5 (five) minutes as needed for chest  pain.    . pantoprazole (PROTONIX) 40 MG tablet TAKE 1 TABLET BY MOUTH DAILY 30 tablet 4  . rosuvastatin (CRESTOR) 20 MG tablet Take 20 mg by mouth every evening.     . saxagliptin HCl (ONGLYZA) 5 MG TABS tablet Take 5 mg by mouth daily.    Marland Kitchen triamcinolone cream (KENALOG) 0.1 % Apply 1 application topically 2 (two) times daily.    . B-D ULTRAFINE III SHORT PEN 31G X 8 MM MISC Use as directed  5  . bacitracin-polymyxin b (POLYSPORIN) ointment Apply topically 2 (two) times daily.    . camphor-menthol (SARNA) lotion Apply 1 application topically as needed for itching.    . telmisartan (MICARDIS) 20 MG tablet Take 1 tablet (20 mg total) by mouth daily. (Patient not taking: Reported on 10/24/2015) 30 tablet 11   No current facility-administered medications for this visit.    Allergies:   Fish allergy; Iohexol; Ivp dye; Latex; Shellfish allergy; Shellfish-derived products; Glucophage; Metformin; and Other    Social History:  The patient  reports that she has never smoked. She has never used smokeless tobacco. She reports that she does not drink alcohol or use illicit drugs.   Family History:  The patient's family history includes Hyperlipidemia in her father and mother; Hypertension in her father and mother.    ROS:  Please see the history of present illness. All other systems are reviewed and negative.    PHYSICAL EXAM: VS:  BP 124/62 mmHg  Pulse 68   Ht 5\' 2"  (1.575 m)  Wt 176 lb (79.833 kg)  BMI 32.18 kg/m2 , BMI Body mass index is 32.18 kg/(m^2). GEN: Well nourished, well developed, female in no acute distress HEENT: normal for age  Neck: no JVD, no carotid bruit, no masses Cardiac: RRR; no murmur, no rubs, or gallops Respiratory:  clear to auscultation bilaterally, normal work of breathing GI: soft, nontender, nondistended, + BS MS: no deformity or atrophy; no edema; distal pulses are decreased but palpable in lower extremities, 2+ in both upper extremities. There are no subclavian bruits noted  Skin: warm and dry, no rash Neuro:  Strength and sensation are intact Psych: euthymic mood, full affect   EKG:  EKG is not ordered today.  Recent Labs: 05/28/2015: ALT 16; BUN 11; Creatinine, Ser 0.86; Hemoglobin 12.2; Platelets 305; Potassium 4.4; Sodium 138    Lipid Panel    Component Value Date/Time   CHOL 145 11/27/2013 0605   TRIG 77 11/27/2013 0605   HDL 45 11/27/2013 0605   CHOLHDL 3.2 11/27/2013 0605   VLDL 15 11/27/2013 0605   LDLCALC 85 11/27/2013 0605     Wt Readings from Last 3 Encounters:  10/24/15 176 lb (79.833 kg)  06/12/15 176 lb 11.2 oz (80.151 kg)  05/14/15 176 lb 4.8 oz (79.969 kg)     Other studies Reviewed: Additional studies/ records that were reviewed today include: Previous office notes, hospital records and tests.  ASSESSMENT AND PLAN:  1.  PAD:  It is unclear what is causing the pain in her arms, but there are no circulatory deficits noted. She is having some pain in her legs and Dr. Gwenlyn Found had previously ordered arterial Dopplers. She is to continue current medical therapy and we will obtain arterial Dopplers to follow-up on her lower extremity disease.  2. Hypertension: Her blood pressure is controlled today. She is to stop the my Cardizem continue other medications as previously prescribed. She can be tried on a another ARB if her blood  pressure becomes uncontrolled.  3. Diabetes: As she is  having a.m. hypoglycemia, they have requested that she have a protein snack at bedtime such as great toe GERD, milk or peanut butter. If this does not help, she is to contact her family physician for further management.  Current medicines are reviewed at length with the patient today.  The patient does not have concerns regarding medicines.  The following changes have been made:  Stop Micardis  Labs/ tests ordered today include:   none  Disposition:   FU with Dr. Gwenlyn Found in 3 months and [name] as scheduled  Signed, Lenoard Aden  10/24/2015 4:50 PM    [address], [address] [place], [place]  [address] Phone: [phone]; Fax: [phone]

## 2015-10-24 NOTE — Patient Instructions (Signed)
Your physician has recommended you make the following change in your medication: STOP the micardis. [facility])  Your physician has requested that you have a lower extremity arterial exercise duplex. During this test, exercise and ultrasound are used to evaluate arterial blood flow in the legs. Allow one hour for this exam. There are no restrictions or special instructions.   Suanne Marker recommends that you eat [place] Yogurt, peanut butter or drink a glass of milk before bed. This will keep your blood sugar from dropping.  Your physician recommends that you schedule a follow-up appointment in: 3 months with dr. Gwenlyn Found.

## 2015-10-27 ENCOUNTER — Encounter: Payer: Self-pay | Admitting: Cardiology

## 2015-10-27 NOTE — Progress Notes (Signed)
Quick Note:  Recent Labs: 10/01/2015 Na+ 138, K+ 4.5, Cl- 100, HCO3- 29 , BUN 20, Cr 1.1, Glu 136, Ca2+ 19.5; AST 17, ALT 22; AlkP 47, Alb 3.8, TP 7.2, T Bili 0.3 CBC: W 6.0, H/H 11.5/37.4 Plt to 75 No lipids checked since November 2015  [facility], Leonie Green, MD  ______

## 2015-10-28 ENCOUNTER — Other Ambulatory Visit: Payer: Self-pay | Admitting: Cardiovascular Disease

## 2015-10-28 DIAGNOSIS — I739 Peripheral vascular disease, unspecified: Secondary | ICD-10-CM

## 2015-11-04 ENCOUNTER — Ambulatory Visit (HOSPITAL_COMMUNITY)
Admission: RE | Admit: 2015-11-04 | Discharge: 2015-11-04 | Disposition: A | Discharge status: Home / Self Care | Payer: Medicare Other | Source: Ambulatory Visit | Attending: Cardiovascular Disease | Admitting: Cardiovascular Disease

## 2015-11-04 ENCOUNTER — Ambulatory Visit: Payer: Medicare Other | Admitting: *Deleted

## 2015-11-04 VITALS — BP 144/94 | HR 94

## 2015-11-04 DIAGNOSIS — I739 Peripheral vascular disease, unspecified: Secondary | ICD-10-CM | POA: Diagnosis not present

## 2015-11-04 DIAGNOSIS — R938 Abnormal findings on diagnostic imaging of other specified body structures: Secondary | ICD-10-CM | POA: Diagnosis not present

## 2015-11-04 DIAGNOSIS — I10 Essential (primary) hypertension: Secondary | ICD-10-CM | POA: Diagnosis not present

## 2015-11-04 DIAGNOSIS — I779 Disorder of arteries and arterioles, unspecified: Secondary | ICD-10-CM | POA: Insufficient documentation

## 2015-11-04 DIAGNOSIS — E1151 Type 2 diabetes mellitus with diabetic peripheral angiopathy without gangrene: Secondary | ICD-10-CM | POA: Insufficient documentation

## 2015-11-04 DIAGNOSIS — E785 Hyperlipidemia, unspecified: Secondary | ICD-10-CM | POA: Insufficient documentation

## 2015-11-04 NOTE — Patient Instructions (Signed)
F/u w/ PCP as recommended. Call for refill requests for BP & other medications.

## 2015-11-04 NOTE — Progress Notes (Signed)
Pt seen for PV study. While here she shared symptoms of dizziness. Not postural in nature. [name] PA requested add-on for nurse visit, CBG check and BP (ortho) check.  These tests were performed. CBG result 134.  BP and HR as recorded. Note no worsening dizziness on standing. Symptoms have been intermittent and pt indicated not present during evaluation.  [name] verified findings, suggested pt not to change any meds, f/u w/ PCP for possible w/u of BPPV. Would have PCP make med suggestions. Cleared for cardiac concerns.  Pt verbalized understanding of instructions. Shares known history of acute vertigo episodes resolved w/ medications.

## 2015-11-14 DIAGNOSIS — I10 Essential (primary) hypertension: Secondary | ICD-10-CM | POA: Diagnosis not present

## 2015-11-14 DIAGNOSIS — E118 Type 2 diabetes mellitus with unspecified complications: Secondary | ICD-10-CM | POA: Diagnosis not present

## 2015-11-14 DIAGNOSIS — R42 Dizziness and giddiness: Secondary | ICD-10-CM | POA: Diagnosis not present

## 2015-11-27 DIAGNOSIS — H8113 Benign paroxysmal vertigo, bilateral: Secondary | ICD-10-CM | POA: Diagnosis not present

## 2015-12-03 DIAGNOSIS — H8113 Benign paroxysmal vertigo, bilateral: Secondary | ICD-10-CM | POA: Diagnosis not present

## 2015-12-09 ENCOUNTER — Encounter: Payer: Self-pay | Admitting: Cardiovascular Disease

## 2015-12-09 ENCOUNTER — Ambulatory Visit (INDEPENDENT_AMBULATORY_CARE_PROVIDER_SITE_OTHER): Payer: Medicare Other | Admitting: Cardiovascular Disease

## 2015-12-09 VITALS — BP 120/84 | HR 90 | Ht 63.0 in | Wt 165.0 lb

## 2015-12-09 DIAGNOSIS — I209 Angina pectoris, unspecified: Secondary | ICD-10-CM | POA: Diagnosis not present

## 2015-12-09 DIAGNOSIS — I739 Peripheral vascular disease, unspecified: Secondary | ICD-10-CM

## 2015-12-09 NOTE — Progress Notes (Signed)
12/09/2015 [name]   [birth year]  [id number]  Primary Physician Dwan Bolt, MD Primary Cardiologist: Lorretta Harp MD Renae Gloss   HPI:  Ms. Te is a 81 year old married African American female patient of Dr. Shanon Brow Harding's and Dr. Leigh Aurora. I last saw her in the office 05/14/15.She has known CAD status post coronary bypass grafting in the past (1998). She has preserved LV function and nonischemic Myoview. Because of claudication she underwent peripheral vascular angiography and other [name] 02/22/13 revealing significant popliteal and infrapopliteal disease. She has lifestyle limiting claudication.on 05/16/13 she underwent diamondback orbital rotational atherectomy, PTA of her distal right SFA and stenting using coronary drug-eluting stent for peroneal artery which was her only patent artery on the right. She does not notice any improvement and claudication since her procedure although she says her right leg his less "heavy" and that her left leg is much more symptomatic.I restudied her 07/20/13 with the intention of fixing her left leg however I decided to defer this because of anatomic complexity. Ultimately I referred her to Dr. Brunetta Jeans in [place] who performed intervention on her left popliteal and tibioperoneal trunk on 10/01/13. Followup Dopplers performed 04/29/14 were excellent with ABIs of greater than 1 bilaterally, patent peroneal stent on the right a patent left popliteal. Her most recent Doppler studies performed in our office 11/04/15 revealed normal ABIs bilaterally with patent stents.  Current Outpatient Prescriptions  Medication Sig Dispense Refill  . acetaminophen (TYLENOL) 500 MG tablet Take 1,000 mg by mouth every 6 (six) hours as needed.    . B-D ULTRAFINE III SHORT PEN 31G X 8 MM MISC Use as directed  5  . carvedilol (COREG) 3.125 MG tablet TAKE 1 TABLET (3.125 MG TOTAL) BY MOUTH 2 (TWO) TIMES DAILY. 180 tablet 0  . clopidogrel  (PLAVIX) 75 MG tablet Take 75 mg by mouth daily.    . Coenzyme Q10 (CO Q 10 PO) Take 1 tablet by mouth daily.     Marland Kitchen estradiol (ESTRACE) 1 MG tablet Take 1 mg by mouth daily.    Marland Kitchen ezetimibe (ZETIA) 10 MG tablet Take 10 mg by mouth daily.    . furosemide (LASIX) 20 MG tablet Take 1 tablet by mouth daily. Take 1 tab daily  0  . glimepiride (AMARYL) 4 MG tablet Take 4 mg by mouth daily before breakfast.    . hydrOXYzine (ATARAX/VISTARIL) 25 MG tablet TAKE 1 TO 2 TABLETS BY MOUTH AT BEDTIME FOR ITCHING  12  . isosorbide mononitrate (IMDUR) 30 MG 24 hr tablet Take 30 mg by mouth 3 (three) times daily.    . Multiple Vitamins-Minerals (CENTRUM SILVER PO) Take 1 tablet by mouth daily.    . nitroGLYCERIN (NITROSTAT) 0.4 MG SL tablet Place 0.4 mg under the tongue every 5 (five) minutes as needed for chest pain.    . pantoprazole (PROTONIX) 40 MG tablet TAKE 1 TABLET BY MOUTH DAILY 30 tablet 4  . rosuvastatin (CRESTOR) 20 MG tablet Take 20 mg by mouth every evening.     . saxagliptin HCl (ONGLYZA) 5 MG TABS tablet Take 5 mg by mouth daily.     No current facility-administered medications for this visit.    Allergies  Allergen Reactions  . Fish Allergy Hives and Shortness Of Breath  . Iohexol Shortness Of Breath  . Ivp Dye [Iodinated Diagnostic Agents] Shortness Of Breath  . Latex Hives, Shortness Of Breath and Itching    REACTION: wheezing  . Shellfish Allergy Hives  and Shortness Of Breath    All seafood  . Shellfish-Derived Products Anaphylaxis and Hives  . Glucophage [Metformin Hcl] Other (See Comments)    Instructed by physician not to take anymore   . Metformin Nausea And Vomiting  . Other     Patient reports allergy to perfumed detergents. Causes itching.    Social History   Social History  . Marital Status: Married    Spouse Name: N/A  . Number of Children: N/A  . Years of Education: N/A   Occupational History  . Not on file.   Social History Main Topics  . Smoking status:  Never Smoker   . Smokeless tobacco: Never Used  . Alcohol Use: No     Comment: rare glass of wine   . Drug Use: No  . Sexual Activity: Not on file   Other Topics Concern  . Not on file   Social History Narrative    She is a married mother of 56, grandmother of 77, great grandmother of 93.    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    She does not really get a lot of exercise,    She does not smoke or drink EtOH.     Review of Systems: General: negative for chills, fever, night sweats or weight changes.  Cardiovascular: negative for chest pain, dyspnea on exertion, edema, orthopnea, palpitations, paroxysmal nocturnal dyspnea or shortness of breath Dermatological: negative for rash Respiratory: negative for cough or wheezing Urologic: negative for hematuria Abdominal: negative for nausea, vomiting, diarrhea, bright red blood per rectum, melena, or hematemesis Neurologic: negative for visual changes, syncope, or dizziness All other systems reviewed and are otherwise negative except as noted above.    Blood pressure 120/84, pulse 90, height 5\' 3"  (1.6 m), weight 165 lb (74.844 kg).  General appearance: alert and no distress Neck: no adenopathy, no carotid bruit, no JVD, supple, symmetrical, trachea midline and thyroid not enlarged, symmetric, no tenderness/mass/nodules Lungs: clear to auscultation bilaterally Heart: regular rate and rhythm, S1, S2 normal, no murmur, click, rub or gallop Extremities: extremities normal, atraumatic, no cyanosis or edema  EKG not performed today  ASSESSMENT AND PLAN:   PAD,severe calcified pop and tibial peroneal trunk disease bilateraly History of peripheral arterial disease status post diamondback orbital rotational atherectomy, PTA of her distal right SFA and stenting using a coronary drug-eluting stent of her peroneal artery which was her only patent runoff 05/16/13. She subsequently underwent recatheterization of her left leg by  Dr. Brunetta Jeans. Her most recent Doppler study performed N/22/16 revealed normal ABIs bilaterally with patent stents. She denies claudication.      Lorretta Harp MD FACP,FACC,FAHA, [facility] 12/09/2015 11:34 AM

## 2015-12-09 NOTE — Assessment & Plan Note (Addendum)
History of peripheral arterial disease status post diamondback orbital rotational atherectomy, PTA of her distal right SFA and stenting using a coronary drug-eluting stent of her peroneal artery which was her only patent runoff 05/16/13. She subsequently underwent recatheterization of her left leg by Dr. Brunetta Jeans. Her most recent Doppler study performed N/22/16 revealed normal ABIs bilaterally with patent stents. She denies claudication.

## 2015-12-09 NOTE — Patient Instructions (Addendum)
Medication Instructions:  Your physician recommends that you continue on your current medications as directed. Please refer to the Current Medication list given to you today.   Labwork: none  Testing/Procedures: none  Follow-Up: Follow up with Dr. Gwenlyn Found as needed.  Needs 6 month f/u with [name].  Any Other Special Instructions Will Be Listed Below (If Applicable).     If you need a refill on your cardiac medications before your next appointment, please call your pharmacy.

## 2015-12-10 ENCOUNTER — Telehealth: Payer: Self-pay

## 2015-12-10 ENCOUNTER — Other Ambulatory Visit: Payer: Self-pay | Admitting: Cardiology

## 2015-12-10 DIAGNOSIS — I739 Peripheral vascular disease, unspecified: Secondary | ICD-10-CM

## 2015-12-10 NOTE — Telephone Encounter (Signed)
Rx request sent to pharmacy.  

## 2015-12-10 NOTE — Telephone Encounter (Signed)
-----   Message from Lorretta Harp, MD sent at 11/09/2015  5:37 PM EST ----- Patent stents. No change from prior study. Repeat in 12 months.

## 2015-12-17 DIAGNOSIS — H8113 Benign paroxysmal vertigo, bilateral: Secondary | ICD-10-CM | POA: Diagnosis not present

## 2016-01-07 DIAGNOSIS — I10 Essential (primary) hypertension: Secondary | ICD-10-CM | POA: Diagnosis not present

## 2016-01-07 DIAGNOSIS — E118 Type 2 diabetes mellitus with unspecified complications: Secondary | ICD-10-CM | POA: Diagnosis not present

## 2016-01-07 DIAGNOSIS — Z79899 Other long term (current) drug therapy: Secondary | ICD-10-CM | POA: Diagnosis not present

## 2016-01-07 DIAGNOSIS — E789 Disorder of lipoprotein metabolism, unspecified: Secondary | ICD-10-CM | POA: Diagnosis not present

## 2016-02-11 ENCOUNTER — Encounter: Payer: Self-pay | Admitting: Cardiology

## 2016-02-11 ENCOUNTER — Ambulatory Visit (INDEPENDENT_AMBULATORY_CARE_PROVIDER_SITE_OTHER): Payer: Medicare Other | Admitting: Cardiology

## 2016-02-11 VITALS — BP 126/86 | HR 72 | Ht 62.0 in | Wt 178.0 lb

## 2016-02-11 DIAGNOSIS — E785 Hyperlipidemia, unspecified: Secondary | ICD-10-CM

## 2016-02-11 DIAGNOSIS — I209 Angina pectoris, unspecified: Secondary | ICD-10-CM | POA: Diagnosis not present

## 2016-02-11 DIAGNOSIS — I739 Peripheral vascular disease, unspecified: Secondary | ICD-10-CM

## 2016-02-11 DIAGNOSIS — I10 Essential (primary) hypertension: Secondary | ICD-10-CM | POA: Diagnosis not present

## 2016-02-11 DIAGNOSIS — I25119 Atherosclerotic heart disease of native coronary artery with unspecified angina pectoris: Secondary | ICD-10-CM | POA: Diagnosis not present

## 2016-02-11 DIAGNOSIS — Z959 Presence of cardiac and vascular implant and graft, unspecified: Secondary | ICD-10-CM

## 2016-02-11 DIAGNOSIS — R0609 Other forms of dyspnea: Secondary | ICD-10-CM

## 2016-02-11 DIAGNOSIS — R0600 Dyspnea, unspecified: Secondary | ICD-10-CM

## 2016-02-11 DIAGNOSIS — Z95820 Peripheral vascular angioplasty status with implants and grafts: Secondary | ICD-10-CM

## 2016-02-11 NOTE — Patient Instructions (Signed)
Continue same medications.   Your physician wants you to follow-up in: 6 months.  You will receive a reminder letter in the mail two months in advance. If you don't receive a letter, please call our office to schedule the follow-up appointment.  

## 2016-02-11 NOTE — Progress Notes (Signed)
PCP: Dwan Bolt, MD  Clinic Note: Chief Complaint  Patient presents with  . Follow-up  . Dizziness  . Shortness of Breath  . Edema    ankles    HPI: [name] is a 82 y.o. female with a PMH below who presents today for 6-8 month follow-up for CAD-CABG '98, DM-2, HTN, HLD, PAD, hypertension, hyperlipidemia. She had been on probably went as well as Repatha, LADC that she is currently now on Zetia and Crestor. -PAD s/p R Peroneal stent - Dr. Gwenlyn Found; L Popliteal stent - Dr. Armando Reichert.  [name] was last seen by me in June 2016, then by me [name], PA -- Not able to afford Micardis   Dr. Gwenlyn Found Dec 2016 -- reported normal ABIs bilaterally with patent stents. No complaints of claudication  Recent Hospitalizations: None  Studies Reviewed:   Lower extremity Dopplers 11/04/2015 - normal ABIs bilaterally. Abnormal great toe-brachial indices bilaterally (right 0.35, left 0.41); patent right SFA and peroneal stents, patent left popliteal stent, bilateral SFA disease 30-50% without focal stenosis.  Echo and Myoview from May 2016 reviewed below in [facility]  Interval History:  [name] presents today really doing overall pretty well. She denies any significant symptoms. She has mild edema that is pretty well-controlled with her standing dose of Lasix. She does not have to use additional doses. She does still have some positional dizziness, that is stable. She has some exertional dyspnea but nothing that made her overly concerned.   No chest pain or  pressurewith rest or exertion.  No PND, orthopnea or edema. No palpitations, lightheadedness, dizziness, weakness or syncope/near syncope. No TIA/amaurosis fugax symptoms. No limiting  Claudication - she will get some discomfort in her legs if she walks for longer period of time, but not with usual activity.  ROS: A comprehensive was performed. Review of Systems  Constitutional: Negative for fever, weight loss and malaise/fatigue.    HENT: Negative for nosebleeds.   Eyes: Negative for blurred vision and double vision.  Respiratory: Positive for shortness of breath (with exertion, stable). Negative for cough.   Cardiovascular: Positive for claudication (minimal and nonlimiting).  Gastrointestinal: Positive for heartburn (GERD) and nausea. Negative for abdominal pain, blood in stool and melena.  Genitourinary: Negative for hematuria.  Musculoskeletal: Positive for back pain and joint pain (Mostly left knee and behind the left knee.). Negative for myalgias.  Skin: Negative for rash.  Neurological: Positive for dizziness (Improved dizziness, but still somewhat present, positional). Negative for sensory change, speech change, loss of consciousness, weakness and headaches.  Psychiatric/Behavioral: Negative for depression and memory loss. The patient is not nervous/anxious and does not have insomnia.   All other systems reviewed and are negative.   Past Medical History  Diagnosis Date  . Atherosclerotic heart disease native coronary artery w/angina pectoris ([place]) 1992    a. 1992 s/p PTCA of LAD;  b. 1998 CABG x 3; c. 07/2001 Cath: Sev LM/LAD/LCX dzs, 3/3 patent grafts; d. 11/2013 Cath: Known LM/LAD/LCX dzs, VG-OM1 nl, VG->D1 50, LIMA->LAD nl, RCA nl-->Med Rx.;;;e. MYOVIEW 04/2015: NO ISCHEMIA OR INFARCTION, NORMAL EF  . S/P CABG x 3 1998    LIMA-LAD, SVG-OM, SVG-D1  . Severe claudication ([place]) 05/2013    Referred for Peripheral Angio (Dr. Gwenlyn Found --> Dr. Brunetta Jeans in [place])  . PAD (peripheral artery disease) ([place]) 05/2013; 09/2013    a. severe calcified POP-1 & TP Trunk Dz bilat;  b . 05/2013 Diamondback Rot Athrectomy (R POP) --> PTA R  Pop, DES to R Peroneal;  c. 09/2013 PTA/Stenting of L Pop (G. Andree Elk - retrograde access);  d. 04/2014 ABI's: R/L: 1.1/1.1.  . Carotid arterial disease ([place])     a. 05/2014 Carotid U/S: No signif bilat ICA stenosis, >60% L ECA.  . DM (diabetes mellitus), type 2 with peripheral vascular complications  (HCC)     On Oral medications.  . Bradycardia, mild briefly to the 40s, asymptomatic 05/16/2013  . Essential hypertension   . Hyperlipidemia with target LDL less than 70   . GERD (gastroesophageal reflux disease)   . Peptic ulcer disease   . Chronic anemia     Past Surgical History  Procedure Laterality Date  . Cardiac catheterization  07/17/01    2 v CAD with LM, circ, LAD, obtuse diag, mod stenosis of diag vein graft , mild stenosis of marg vein graft, nl EF, medical treatment  . Coronary angioplasty  1992    PCI to LAD  . Coronary artery bypass graft  1998    LIMA-LAD, SVG-diagonal (none roughly 50% stenosis), SVG-OM  . Tubal ligation    . Back surgery    . Peroneal artery stent  05/15/13    Diamondback rot. atherectomy of high grade segmental popliteal stenosis then Chocolate balloonPTA; then angiosculpt PTA of the prox peroneal with stent-Xpedition   . Pv angio  07/20/13    high-grade calcified popliteal disease with one-vessel runoff via a small anterior tibial artery that has proximal disease as well, no intervention  . Cardiac catheterization  11/2013    Widely patent LIMA-LAD & SVG-OM with mild progression of proximal stenosis ~40-50% in SVG-D1; Widely patent native RCA with known severe native LCA disease  . Transthoracic echocardiogram  March 2012    Mild inferior-apical hypokinesis; EF 50-55%. Aortic sclerosis. PA pressure 30-40 mmHg.  Marland Kitchen Lower extremity angiogram N/A 02/22/2013    Procedure: LOWER EXTREMITY ANGIOGRAM;  Surgeon: Lorretta Harp, MD;  Location: [facility] CATH LAB;  Service: Cardiovascular;  Laterality: N/A;  . Atherectomy Right 05/15/2013    Procedure: ATHERECTOMY;  Surgeon: Lorretta Harp, MD;  Location: [facility] CATH LAB;  Service: Cardiovascular;  Laterality: Right;  popliteal  . Percutaneous stent intervention Right 05/15/2013    Procedure: PERCUTANEOUS STENT INTERVENTION;  Surgeon: Lorretta Harp, MD;  Location: [facility] CATH LAB;  Service: Cardiovascular;  Laterality: Right;   tibioperoneal trunk  . Lower extremity angiogram N/A 07/20/2013    Procedure: LOWER EXTREMITY ANGIOGRAM;  Surgeon: Lorretta Harp, MD;  Location: [facility] CATH LAB;  Service: Cardiovascular;  Laterality: N/A;  . Left heart catheterization with coronary angiogram N/A 11/27/2013    Procedure: LEFT HEART CATHETERIZATION WITH CORONARY ANGIOGRAM;  Surgeon: [name], MD;  Location: [facility] CATH LAB;  Service: Cardiovascular;  Laterality: N/A;  . Nm myoview ltd  04/03/2013; 5/26'/2016    Lexiscan: a)  Apical perfusion defect with no ischemia. Consider prior infarct versus breast attenuation.;; b) 5/'16: LOW RISK, Nl EF ~55-65%, No Ischemia /Infarction  . Transthoracic echocardiogram  02/2011; 04/2015    a) EF 50-55%, mild inf HK;; b)  Nl LV Size & Fxn EF 60-65%. mild LVH, Gr 1 DD.    Prior to Admission medications   Medication Sig Start Date End Date Taking? Authorizing Provider  acetaminophen (TYLENOL) 500 MG tablet Take 1,000 mg by mouth every 6 (six) hours as needed.   Yes Historical Provider, MD  carvedilol (COREG) 3.125 MG tablet TAKE 1 TABLET (3.125 MG TOTAL) BY MOUTH 2 (TWO) TIMES DAILY. 12/10/15  Yes [name], MD  clopidogrel (PLAVIX) 75 MG tablet Take 75 mg by mouth daily.   Yes Historical Provider, MD  Coenzyme Q10 (CO Q 10 PO) Take 1 tablet by mouth daily.    Yes Historical Provider, MD  estradiol (ESTRACE) 1 MG tablet Take 1 mg by mouth daily.   Yes Historical Provider, MD  ezetimibe (ZETIA) 10 MG tablet Take 10 mg by mouth daily.   Yes Historical Provider, MD  furosemide (LASIX) 20 MG tablet Take 1 tablet by mouth daily. Take 1 tab daily 05/19/15  Yes Historical Provider, MD  glimepiride (AMARYL) 4 MG tablet Take 4 mg by mouth daily before breakfast.   Yes Historical Provider, MD  hydrOXYzine (ATARAX/VISTARIL) 25 MG tablet TAKE 1 TO 2 TABLETS BY MOUTH AT BEDTIME FOR ITCHING 10/13/15  Yes Historical Provider, MD  isosorbide mononitrate (IMDUR) 30 MG 24 hr tablet Take 30 mg by mouth 3 (three)  times daily.   Yes Historical Provider, MD  Multiple Vitamins-Minerals (CENTRUM SILVER PO) Take 1 tablet by mouth daily.   Yes Historical Provider, MD  pantoprazole (PROTONIX) 40 MG tablet TAKE 1 TABLET BY MOUTH DAILY 04/08/15  Yes [name], MD  rosuvastatin (CRESTOR) 20 MG tablet Take 20 mg by mouth every evening.    Yes Historical Provider, MD  saxagliptin HCl (ONGLYZA) 5 MG TABS tablet Take 5 mg by mouth daily.   Yes Historical Provider, MD   Allergies  Allergen Reactions  . Fish Allergy Hives and Shortness Of Breath  . Iohexol Shortness Of Breath  . Ivp Dye [Iodinated Diagnostic Agents] Shortness Of Breath  . Latex Hives, Shortness Of Breath and Itching    REACTION: wheezing  . Shellfish Allergy Hives and Shortness Of Breath    All seafood  . Shellfish-Derived Products Anaphylaxis and Hives  . Glucophage [Metformin Hcl] Other (See Comments)    Instructed by physician not to take anymore   . Metformin Nausea And Vomiting  . Other     Patient reports allergy to perfumed detergents. Causes itching.    Social History   Social History  . Marital Status: Married    Spouse Name: N/A  . Number of Children: N/A  . Years of Education: N/A   Social History Main Topics  . Smoking status: Never Smoker   . Smokeless tobacco: Never Used  . Alcohol Use: No     Comment: rare glass of wine   . Drug Use: No  . Sexual Activity: Not Asked   Other Topics Concern  . None   Social History Narrative    She is a married mother of 1, grandmother of 61, great grandmother of 28.    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    She does not really get a lot of exercise,    She does not smoke or drink EtOH.   Family History  Problem Relation Age of Onset  . Hypertension Mother   . Hyperlipidemia Mother   . Hyperlipidemia Father   . Hypertension Father     Wt Readings from Last 3 Encounters:  02/11/16 178 lb (80.74 kg)  12/09/15 165 lb (74.844 kg)  10/24/15 176 lb  (79.833 kg)    PHYSICAL EXAM BP 126/86 mmHg  Pulse 72  Ht 5\' 2"  (1.575 m)  Wt 178 lb (80.74 kg)  BMI 32.55 kg/m2 General appearance: alert, cooperative, appears stated age, no distress and mildly obese  Neck: no adenopathy, no carotid bruit and  no JVD  Lungs: clear to auscultation bilaterally, normal percussion bilaterally and non-labored  Heart: RRR, normal S1, S2, no murmur, click, rub or gallop; nondisplaced PMI  Abdomen: soft, non-tender; bowel sounds normal; no masses, no organomegaly; mild obesity  Extremities: extremities normal, atraumatic, no cyanosis, or edema;There is some mild bruising on the left wrist.  Pulses: 2+ and symmetric upper extremities. Still has diminished pedal pulses, but stable.;  Neurologic: Mental status: Alert, oriented, thought content appropriate; Cranial nerves: normal (II-XII grossly intact    Adult ECG Report Not checked  Other studies Reviewed: Additional studies/ records that were reviewed today include:  Recent Labs:  Followed by PCP  Lab Results  Component Value Date   CREATININE 0.86 05/28/2015    ASSESSMENT / PLAN: Problem List Items Addressed This Visit    PTA/ Stent Rt popliteal and Rt peroneal artery 05/16/13 (Chronic)    Overall seems to be doing relatively well with no significant claudication. Stents appear to be open by recent Doppler study.      PAD,severe calcified pop and tibial peroneal trunk disease bilateraly (Chronic)    She is now had extensive work done to bilateral lower extremities. Doing well. Her claudication is well-controlled.      Essential hypertension (Chronic)    She is no longer on the ARB. Blood pressure looks good on minimal dose of beta blocker and Imdur.      Dyslipidemia, goal LDL below 70 (Chronic)    Her lipids are being monitored by her endocrinologist. She had been on PCSK9 inhibitor medications, but I see that now she is on a statin plus Zetia and coenzyme Q 10. No adverse outcomes to  date.      DOE (dyspnea on exertion) (Chronic)    Probably has some diastolic dysfunction mediated dyspnea. I would like to get her back on ARB,, for now since everything seems stable from a symptom standpoint and blood pressure standpoint, will simply hold off until reassessment.      Claudication ([place]) (Chronic)    No longer having significant claudication symptoms. Stable following extensive work on bilateral lower extremities.      Atherosclerotic heart disease of native coronary artery with angina pectoris ([place]) - Primary (Chronic)    No active anginal symptoms. We had talked about microvascular ischemia in the past and ensuring adequate afterload reduction. No longer on Micardis -- was not started on different ARB. Since she's not having significant active symptoms, we'll simply hold off. She is on low-dose beta blocker which can be titrated if necessary. She is on Imdur.      Angina, class I ([place]) (Chronic)    Overall, her symptoms are improving. She had a Myoview last May. Her cardiopulmonary excise test suggested microvascular ischemia. She is on Imdur and carvedilol. No longer on ARB.         Current medicines are reviewed at length with the patient today. (+/- concerns) none The following changes have been made: None  Studies Ordered:   No orders of the defined types were placed in this encounter.    Follow-up in 6 months    HARDING, Leonie Green, M.D., M.S. Interventional Cardiologist   Pager # [phone] Phone # [phone] [address] [place], [place] [address]

## 2016-02-12 ENCOUNTER — Other Ambulatory Visit: Payer: Self-pay

## 2016-02-12 DIAGNOSIS — Z1231 Encounter for screening mammogram for malignant neoplasm of breast: Secondary | ICD-10-CM

## 2016-02-13 ENCOUNTER — Encounter: Payer: Self-pay | Admitting: Cardiology

## 2016-02-13 NOTE — Assessment & Plan Note (Signed)
No longer having significant claudication symptoms. Stable following extensive work on bilateral lower extremities.

## 2016-02-13 NOTE — Assessment & Plan Note (Signed)
Overall, her symptoms are improving. She had a Myoview last May. Her cardiopulmonary excise test suggested microvascular ischemia. She is on Imdur and carvedilol. No longer on ARB.

## 2016-02-13 NOTE — Assessment & Plan Note (Signed)
She is now had extensive work done to bilateral lower extremities. Doing well. Her claudication is well-controlled.

## 2016-02-13 NOTE — Assessment & Plan Note (Signed)
Her lipids are being monitored by her endocrinologist. She had been on PCSK9 inhibitor medications, but I see that now she is on a statin plus Zetia and coenzyme Q 10. No adverse outcomes to date.

## 2016-02-13 NOTE — Assessment & Plan Note (Signed)
She is no longer on the ARB. Blood pressure looks good on minimal dose of beta blocker and Imdur.

## 2016-02-13 NOTE — Assessment & Plan Note (Addendum)
No active anginal symptoms. We had talked about microvascular ischemia in the past and ensuring adequate afterload reduction. No longer on Micardis -- was not started on different ARB. Since she's not having significant active symptoms, we'll simply hold off. She is on low-dose beta blocker which can be titrated if necessary. She is on Imdur.

## 2016-02-13 NOTE — Assessment & Plan Note (Signed)
Overall seems to be doing relatively well with no significant claudication. Stents appear to be open by recent Doppler study.

## 2016-02-13 NOTE — Assessment & Plan Note (Signed)
Probably has some diastolic dysfunction mediated dyspnea. I would like to get her back on ARB,, for now since everything seems stable from a symptom standpoint and blood pressure standpoint, will simply hold off until reassessment.

## 2016-02-16 ENCOUNTER — Encounter: Payer: Self-pay | Admitting: Sports Medicine

## 2016-02-16 ENCOUNTER — Ambulatory Visit (INDEPENDENT_AMBULATORY_CARE_PROVIDER_SITE_OTHER): Payer: Medicare Other | Admitting: Sports Medicine

## 2016-02-16 DIAGNOSIS — E1140 Type 2 diabetes mellitus with diabetic neuropathy, unspecified: Secondary | ICD-10-CM | POA: Diagnosis not present

## 2016-02-16 DIAGNOSIS — M79671 Pain in right foot: Secondary | ICD-10-CM

## 2016-02-16 DIAGNOSIS — M79672 Pain in left foot: Secondary | ICD-10-CM

## 2016-02-16 DIAGNOSIS — I739 Peripheral vascular disease, unspecified: Secondary | ICD-10-CM | POA: Diagnosis not present

## 2016-02-16 DIAGNOSIS — R252 Cramp and spasm: Secondary | ICD-10-CM | POA: Diagnosis not present

## 2016-02-16 DIAGNOSIS — I209 Angina pectoris, unspecified: Secondary | ICD-10-CM | POA: Diagnosis not present

## 2016-02-16 NOTE — Progress Notes (Signed)
Patient ID: [name], female   DOB: 17-Jul-1933, 82 y.o.   MRN: [id number] Subjective: [name] is a 82 y.o. female patient with history of type 2 diabetes who presents to office today complaining of swelling, achy feeling in toes, and random spams to both feet >2 years. Patient tried Aspercream, rubbing alcohol, and Aleve with no relief. Patient states that the glucose reading this morning was 200 mg/dl. Patient denies any new changes in medication or new problems. Patient denies any new cramping, numbness, burning or tingling in the legs. Admits to continued left knee pain of which she was recommended to get a brace for.   Patient Active Problem List   Diagnosis Date Noted  . Arteriosclerosis of coronary artery 10/24/2015  . Diabetes mellitus ([place]) 10/24/2015  . HLD (hyperlipidemia) 10/24/2015  . Non-insulin dependent type 2 diabetes mellitus ([place]) 10/24/2015  . Angina, class I ([place]) 04/22/2015  . DOE (dyspnea on exertion) 04/22/2015  . Leg edema, left 04/22/2015  . Obesity (BMI 30-39.9) 09/06/2014  . Accumulation of fluid in tissues 03/21/2014  . Vertigo, central 01/02/2014  . Vertigo 01/02/2014  . Limb pain- echymosis, pain Lt radial artery after cath 12/11/2013  . Musculoskeletal chest pain 11/28/2013  . Dyslipidemia, goal LDL below 70 05/17/2013  . Essential hypertension 05/17/2013  . Claudication ([place]) 05/16/2013  . PTA/ Stent Rt popliteal and Rt peroneal artery 05/16/13 05/16/2013  . PAD,severe calcified pop and tibial peroneal trunk disease bilateraly   . ABDOMINAL PAIN RIGHT LOWER QUADRANT 10/07/2010  . CVA-STROKE 03/04/2010  . BARRETT'S ESOPHAGUS 03/04/2010  . FECAL INCONTINENCE 03/04/2010  . DM 01/14/2010  . ANEMIA OF CHRONIC DISEASE 01/14/2010  . Atherosclerotic heart disease of native coronary artery with angina pectoris ([place]) 01/14/2010  . DIVERTICULOSIS OF COLON 01/14/2010  . PERSONAL HISTORY OF COLONIC POLYPS 01/14/2010  . SHELLFISH ALLERGY 01/14/2010   Current  Outpatient Prescriptions on File Prior to Visit  Medication Sig Dispense Refill  . acetaminophen (TYLENOL) 500 MG tablet Take 1,000 mg by mouth every 6 (six) hours as needed.    . carvedilol (COREG) 3.125 MG tablet TAKE 1 TABLET (3.125 MG TOTAL) BY MOUTH 2 (TWO) TIMES DAILY. 180 tablet 1  . clopidogrel (PLAVIX) 75 MG tablet Take 75 mg by mouth daily.    . Coenzyme Q10 (CO Q 10 PO) Take 1 tablet by mouth daily.     Marland Kitchen estradiol (ESTRACE) 1 MG tablet Take 1 mg by mouth daily.    Marland Kitchen ezetimibe (ZETIA) 10 MG tablet Take 10 mg by mouth daily.    . furosemide (LASIX) 20 MG tablet Take 1 tablet by mouth daily. Take 1 tab daily  0  . glimepiride (AMARYL) 4 MG tablet Take 4 mg by mouth daily before breakfast.    . hydrOXYzine (ATARAX/VISTARIL) 25 MG tablet TAKE 1 TO 2 TABLETS BY MOUTH AT BEDTIME FOR ITCHING  12  . isosorbide mononitrate (IMDUR) 30 MG 24 hr tablet Take 30 mg by mouth 3 (three) times daily.    . Multiple Vitamins-Minerals (CENTRUM SILVER PO) Take 1 tablet by mouth daily.    . pantoprazole (PROTONIX) 40 MG tablet TAKE 1 TABLET BY MOUTH DAILY 30 tablet 4  . rosuvastatin (CRESTOR) 20 MG tablet Take 20 mg by mouth every evening.     . saxagliptin HCl (ONGLYZA) 5 MG TABS tablet Take 5 mg by mouth daily.     No current facility-administered medications on file prior to visit.   Allergies  Allergen Reactions  .  Fish Allergy Hives and Shortness Of Breath  . Iohexol Shortness Of Breath  . Ivp Dye [Iodinated Diagnostic Agents] Shortness Of Breath  . Latex Hives, Shortness Of Breath and Itching    REACTION: wheezing  . Shellfish Allergy Hives and Shortness Of Breath    All seafood  . Shellfish-Derived Products Anaphylaxis and Hives  . Glucophage [Metformin Hcl] Other (See Comments)    Instructed by physician not to take anymore   . Metformin Nausea And Vomiting  . Other     Patient reports allergy to perfumed detergents. Causes itching.    Objective: General: Patient is awake, alert, and  oriented x 3 and in no acute distress.  Integument: Skin is warm, dry and supple bilateral. Nails are short and well manicured. No signs of infection. No open lesions or preulcerative lesions present bilateral. Remaining integument unremarkable.  Vasculature:  Dorsalis Pedis pulse 1/4 bilateral. Posterior Tibial pulse  1/4 bilateral.  Capillary fill time <3 sec 1-5 bilateral. No hair growth to the level of the digits. Temperature gradient within normal limits. + varicosities present bilateral. Trace edema present bilateral.   Neurology: The patient has diminished sensation measured with a 5.07/10g Semmes Weinstein Monofilament at all pedal sites bilateral . Vibratory sensation diminished bilateral with tuning fork. No Babinski sign present bilateral.   Musculoskeletal: No pain to palpation during exam, no gross pedal deformities noted bilateral. Muscular strength 5/5 in all lower extremity muscular groups bilateral without limitation on range of motion . No tenderness with calf compression bilateral.  Assessment and Plan: Problem List Items Addressed This Visit    None    Visit Diagnoses    Foot pain, bilateral    -  Primary    Foot spasms        PVD (peripheral vascular disease) ([place])        Type 2 diabetes, controlled, with neuropathy ([place])           -Examined patient. -Discussed and educated patient on diabetic foot care, especially with  regards to the vascular, neurological and musculoskeletal systems.  -Stressed the importance of good glycemic control and the detriment of not  controlling glucose levels in relation to the foot. - Rx compression stocking for likely PVD contribution to general achy feeling to feet and swelling that is complicated by Diabetic neuropathy. Previous ABIs normal.  -Recommended Epsom Salt Soaks twice a week for 20 mins. Use 1/4 Cup of Epsom Salt and to be careful with water temperature since Diabetic -Recommended to Drink 1 glass of tonic water at bed  time and recommend patient to make sure she is hydrated throughout the day with water  -Recommend good supportive shoes daily  -Answered all patient questions -Patient to return as needed for at risk foot care -Patient advised to call the office if any problems or questions arise in the meantime.  Landis Martins, DPM

## 2016-02-16 NOTE — Patient Instructions (Signed)
Epsom Salt Soaks twice a week for 20 mins. Use 1/4 Cup of Epsom Salt  Drink 1 glass of tonic water at bed time  Make sure you are hydrated throughout the day with water   Wear good shoes and compression stockings daily

## 2016-03-02 DIAGNOSIS — I10 Essential (primary) hypertension: Secondary | ICD-10-CM | POA: Diagnosis not present

## 2016-03-02 DIAGNOSIS — Z23 Encounter for immunization: Secondary | ICD-10-CM | POA: Diagnosis not present

## 2016-03-02 DIAGNOSIS — E118 Type 2 diabetes mellitus with unspecified complications: Secondary | ICD-10-CM | POA: Diagnosis not present

## 2016-03-02 DIAGNOSIS — M79606 Pain in leg, unspecified: Secondary | ICD-10-CM | POA: Diagnosis not present

## 2016-03-02 DIAGNOSIS — M7989 Other specified soft tissue disorders: Secondary | ICD-10-CM | POA: Diagnosis not present

## 2016-03-03 ENCOUNTER — Ambulatory Visit
Admission: RE | Admit: 2016-03-03 | Discharge: 2016-03-03 | Disposition: A | Discharge status: Home / Self Care | Payer: Medicare Other | Source: Ambulatory Visit

## 2016-03-03 DIAGNOSIS — Z1231 Encounter for screening mammogram for malignant neoplasm of breast: Secondary | ICD-10-CM

## 2016-03-10 DIAGNOSIS — E559 Vitamin D deficiency, unspecified: Secondary | ICD-10-CM | POA: Diagnosis not present

## 2016-03-17 DIAGNOSIS — R609 Edema, unspecified: Secondary | ICD-10-CM | POA: Diagnosis not present

## 2016-03-17 DIAGNOSIS — E118 Type 2 diabetes mellitus with unspecified complications: Secondary | ICD-10-CM | POA: Diagnosis not present

## 2016-03-17 DIAGNOSIS — R05 Cough: Secondary | ICD-10-CM | POA: Diagnosis not present

## 2016-03-22 ENCOUNTER — Other Ambulatory Visit: Payer: Self-pay | Admitting: Cardiology

## 2016-03-22 NOTE — Telephone Encounter (Signed)
REFILL 

## 2016-04-16 DIAGNOSIS — E789 Disorder of lipoprotein metabolism, unspecified: Secondary | ICD-10-CM | POA: Diagnosis not present

## 2016-04-16 DIAGNOSIS — E118 Type 2 diabetes mellitus with unspecified complications: Secondary | ICD-10-CM | POA: Diagnosis not present

## 2016-04-19 DIAGNOSIS — R109 Unspecified abdominal pain: Secondary | ICD-10-CM | POA: Diagnosis not present

## 2016-04-27 DIAGNOSIS — E118 Type 2 diabetes mellitus with unspecified complications: Secondary | ICD-10-CM | POA: Diagnosis not present

## 2016-04-28 DIAGNOSIS — Z124 Encounter for screening for malignant neoplasm of cervix: Secondary | ICD-10-CM | POA: Diagnosis not present

## 2016-04-28 DIAGNOSIS — Z6832 Body mass index (BMI) 32.0-32.9, adult: Secondary | ICD-10-CM | POA: Diagnosis not present

## 2016-04-28 DIAGNOSIS — R42 Dizziness and giddiness: Secondary | ICD-10-CM | POA: Diagnosis not present

## 2016-04-28 DIAGNOSIS — Z1272 Encounter for screening for malignant neoplasm of vagina: Secondary | ICD-10-CM | POA: Diagnosis not present

## 2016-05-06 DIAGNOSIS — E118 Type 2 diabetes mellitus with unspecified complications: Secondary | ICD-10-CM | POA: Diagnosis not present

## 2016-05-26 DIAGNOSIS — I10 Essential (primary) hypertension: Secondary | ICD-10-CM | POA: Diagnosis not present

## 2016-05-26 DIAGNOSIS — E118 Type 2 diabetes mellitus with unspecified complications: Secondary | ICD-10-CM | POA: Diagnosis not present

## 2016-05-26 DIAGNOSIS — E789 Disorder of lipoprotein metabolism, unspecified: Secondary | ICD-10-CM | POA: Diagnosis not present

## 2016-06-01 DIAGNOSIS — E118 Type 2 diabetes mellitus with unspecified complications: Secondary | ICD-10-CM | POA: Diagnosis not present

## 2016-06-02 DIAGNOSIS — I2510 Atherosclerotic heart disease of native coronary artery without angina pectoris: Secondary | ICD-10-CM | POA: Diagnosis not present

## 2016-06-02 DIAGNOSIS — E118 Type 2 diabetes mellitus with unspecified complications: Secondary | ICD-10-CM | POA: Diagnosis not present

## 2016-06-02 DIAGNOSIS — W57XXXA Bitten or stung by nonvenomous insect and other nonvenomous arthropods, initial encounter: Secondary | ICD-10-CM | POA: Diagnosis not present

## 2016-06-02 DIAGNOSIS — F419 Anxiety disorder, unspecified: Secondary | ICD-10-CM | POA: Diagnosis not present

## 2016-06-14 ENCOUNTER — Ambulatory Visit (INDEPENDENT_AMBULATORY_CARE_PROVIDER_SITE_OTHER): Payer: Medicare Other

## 2016-06-14 ENCOUNTER — Ambulatory Visit (INDEPENDENT_AMBULATORY_CARE_PROVIDER_SITE_OTHER): Payer: Medicare Other | Admitting: Podiatry

## 2016-06-14 ENCOUNTER — Encounter: Payer: Self-pay | Admitting: Podiatry

## 2016-06-14 DIAGNOSIS — M7742 Metatarsalgia, left foot: Secondary | ICD-10-CM | POA: Diagnosis not present

## 2016-06-14 DIAGNOSIS — I209 Angina pectoris, unspecified: Secondary | ICD-10-CM

## 2016-06-14 DIAGNOSIS — M722 Plantar fascial fibromatosis: Secondary | ICD-10-CM

## 2016-06-14 DIAGNOSIS — E1140 Type 2 diabetes mellitus with diabetic neuropathy, unspecified: Secondary | ICD-10-CM | POA: Diagnosis not present

## 2016-06-14 DIAGNOSIS — M8430XA Stress fracture, unspecified site, initial encounter for fracture: Secondary | ICD-10-CM | POA: Diagnosis not present

## 2016-06-14 NOTE — Progress Notes (Signed)
Subjective:     Patient ID: Garry Heater, female   DOB: 1933-03-21, 82 y.o.   MRN: [id number]  HPI this patient presents the office today saying that she is scheduled for preventative foot care services. She then remarks that her pain has become significant through both her left and right feet for the last 2 weeks. She states that she is having difficulty walking and needs to remove her shoes and rub her feet. She relates having pain in the  heel of both feet and  in the arch of both feet.   She also relates having pain noted in her left forefoot. She denies any history of trauma or injury to the foot. She has a past medical history of having PVD for which she is had stents put in both legs. Patient does have diabetes mellitus which she takes medicine by mouth. She is interested in having her pain relieved and we chose to treat her painful feet and reschedule her for preventative foot care services in the future Review of Systems     Objective:   Physical Exam GENERAL APPEARANCE: Alert, conversant. Appropriately groomed. No acute distress.  VASCULAR: Pedal pulses are  palpable at  [facility] and PT bilateral.  Capillary refill time is immediate to all digits,  Normal temperature gradient.   NEUROLOGIC: sensation is diminished  to 5.07 monofilament at 5/5 sites bilateral.  Light touch is intact bilateral, Muscle strength normal.  MUSCULOSKELETAL: acceptable muscle strength, tone and stability bilateral.  Intrinsic muscluature intact bilateral.  Rectus appearance of foot and digits noted bilateral. HAV 1st MPJ  B/L.  Palpable pain left arch and left heel.    DERMATOLOGIC: skin color, texture, and turgor are within normal limits.  No preulcerative lesions or ulcers  are seen, no interdigital maceration noted.  No open lesions present. . No drainage noted.  NAILS  Thick disfigured discolored nails both feet.      Assessment:  Plantar fascitis B/L  Metatarsalgia left forefoot.     Plan:     ROV  Xrays  reveal no pathology.   HAV  B/L.  Recommended powerstep insoles and added metatarsal pad left foot.  RTC 2 weeks for reevaluation and preventive footcare services.      Gardiner Barefoot DPM

## 2016-06-22 ENCOUNTER — Other Ambulatory Visit: Payer: Self-pay | Admitting: Cardiology

## 2016-06-29 DIAGNOSIS — E118 Type 2 diabetes mellitus with unspecified complications: Secondary | ICD-10-CM | POA: Diagnosis not present

## 2016-06-30 DIAGNOSIS — Z961 Presence of intraocular lens: Secondary | ICD-10-CM | POA: Diagnosis not present

## 2016-06-30 DIAGNOSIS — E119 Type 2 diabetes mellitus without complications: Secondary | ICD-10-CM | POA: Diagnosis not present

## 2016-06-30 DIAGNOSIS — Z0100 Encounter for examination of eyes and vision without abnormal findings: Secondary | ICD-10-CM | POA: Diagnosis not present

## 2016-07-14 ENCOUNTER — Encounter: Payer: Self-pay | Admitting: Podiatry

## 2016-07-14 ENCOUNTER — Ambulatory Visit (INDEPENDENT_AMBULATORY_CARE_PROVIDER_SITE_OTHER): Payer: Medicare Other | Admitting: Podiatry

## 2016-07-14 DIAGNOSIS — L600 Ingrowing nail: Secondary | ICD-10-CM

## 2016-07-14 NOTE — Progress Notes (Signed)
Subjective:     Patient ID: [name], female   DOB: 1933-07-02, 82 y.o.   MRN: [id number]  HPI patient presents with a painful ingrown toenail the right big toe medial border and states the trimming it is no longer affective to help her. She states her circulation is good on her right foot and she's had it checked relatively recently   Review of Systems     Objective:   Physical Exam I checked pulses and found PT and DP pulses to be present and normal on the right foot. I did note good digital perfusion and that toes are warm and on the medial border of the right foot it is quite incurvated and painful when pressed with a significant ingrown toenail deformity that is deep into the nailbed and I'm unable to just trim    Assessment:     Ingrown toenail deformity right hallux is very painful and making it hard for her to wear shoe gear    Plan:     Discussed condition at great length and we also discussed her diabetes and risk associated with removal of the nail corner. Due to her discomfort level she wants to proceed with procedure and understands risk and that it may not heal and that ultimately amputation may be necessary. At this time I infiltrated the right hallux 60 mg Xylocaine Marcaine mixture applied sterile prep and then remove the medial border with sterile instrumentation. I applied phenol 3 applications followed by alcohol lavage and sterile dressing. Patient did have good cap fill time back to the digit and was advised on soaks and will be seen back to recheck and was encouraged to call with any concerns or questions after the procedure

## 2016-07-14 NOTE — Patient Instructions (Signed)

## 2016-07-20 ENCOUNTER — Ambulatory Visit: Payer: Medicare Other | Admitting: Podiatry

## 2016-07-27 ENCOUNTER — Telehealth: Payer: Self-pay | Admitting: *Deleted

## 2016-07-27 DIAGNOSIS — I10 Essential (primary) hypertension: Secondary | ICD-10-CM | POA: Diagnosis not present

## 2016-07-27 DIAGNOSIS — E118 Type 2 diabetes mellitus with unspecified complications: Secondary | ICD-10-CM | POA: Diagnosis not present

## 2016-07-27 DIAGNOSIS — E789 Disorder of lipoprotein metabolism, unspecified: Secondary | ICD-10-CM | POA: Diagnosis not present

## 2016-07-27 NOTE — Telephone Encounter (Signed)
Called patient at [phone] (Home #) to check to see how they were doing from their ingrown toenail procedure that was performed on Wednesday, July 14, 2016. Pt's daughter stated, "Mom seems to be doing fine and getting better, but does still have some tenderness in toe". I told her that is normal and if her pain gets worse to please call our office back so she can be seen. She stated she understood.

## 2016-08-03 DIAGNOSIS — E118 Type 2 diabetes mellitus with unspecified complications: Secondary | ICD-10-CM | POA: Diagnosis not present

## 2016-08-04 DIAGNOSIS — R197 Diarrhea, unspecified: Secondary | ICD-10-CM | POA: Diagnosis not present

## 2016-08-05 DIAGNOSIS — R197 Diarrhea, unspecified: Secondary | ICD-10-CM | POA: Diagnosis not present

## 2016-09-15 DIAGNOSIS — N904 Leukoplakia of vulva: Secondary | ICD-10-CM | POA: Diagnosis not present

## 2016-10-13 HISTORY — PX: OTHER SURGICAL HISTORY: SHX169

## 2016-10-13 HISTORY — PX: TRANSTHORACIC ECHOCARDIOGRAM: SHX275

## 2016-11-01 ENCOUNTER — Encounter (HOSPITAL_COMMUNITY): Payer: Self-pay | Admitting: Emergency Medicine

## 2016-11-01 ENCOUNTER — Emergency Department (HOSPITAL_COMMUNITY): Payer: Medicare Other

## 2016-11-01 ENCOUNTER — Encounter (HOSPITAL_COMMUNITY)
Admission: EM | Disposition: A | Discharge status: Home / Self Care | Payer: Self-pay | Source: Home / Self Care | Attending: Cardiology

## 2016-11-01 ENCOUNTER — Inpatient Hospital Stay (HOSPITAL_COMMUNITY)
Admission: EM | Admit: 2016-11-01 | Discharge: 2016-11-02 | DRG: 281 | Disposition: A | Discharge status: Home / Self Care | Payer: Medicare Other | Attending: Cardiology | Admitting: Cardiology

## 2016-11-01 ENCOUNTER — Other Ambulatory Visit: Payer: Self-pay | Admitting: Cardiovascular Disease

## 2016-11-01 DIAGNOSIS — Z951 Presence of aortocoronary bypass graft: Secondary | ICD-10-CM | POA: Diagnosis not present

## 2016-11-01 DIAGNOSIS — Z888 Allergy status to other drugs, medicaments and biological substances status: Secondary | ICD-10-CM | POA: Diagnosis not present

## 2016-11-01 DIAGNOSIS — E785 Hyperlipidemia, unspecified: Secondary | ICD-10-CM | POA: Diagnosis present

## 2016-11-01 DIAGNOSIS — I10 Essential (primary) hypertension: Secondary | ICD-10-CM | POA: Diagnosis not present

## 2016-11-01 DIAGNOSIS — Z9889 Other specified postprocedural states: Secondary | ICD-10-CM

## 2016-11-01 DIAGNOSIS — E784 Other hyperlipidemia: Secondary | ICD-10-CM | POA: Diagnosis not present

## 2016-11-01 DIAGNOSIS — Z8249 Family history of ischemic heart disease and other diseases of the circulatory system: Secondary | ICD-10-CM | POA: Diagnosis not present

## 2016-11-01 DIAGNOSIS — Z8711 Personal history of peptic ulcer disease: Secondary | ICD-10-CM

## 2016-11-01 DIAGNOSIS — I110 Hypertensive heart disease with heart failure: Secondary | ICD-10-CM | POA: Diagnosis not present

## 2016-11-01 DIAGNOSIS — Z79899 Other long term (current) drug therapy: Secondary | ICD-10-CM | POA: Diagnosis not present

## 2016-11-01 DIAGNOSIS — K219 Gastro-esophageal reflux disease without esophagitis: Secondary | ICD-10-CM | POA: Diagnosis present

## 2016-11-01 DIAGNOSIS — I2510 Atherosclerotic heart disease of native coronary artery without angina pectoris: Secondary | ICD-10-CM | POA: Diagnosis present

## 2016-11-01 DIAGNOSIS — I25119 Atherosclerotic heart disease of native coronary artery with unspecified angina pectoris: Secondary | ICD-10-CM | POA: Diagnosis present

## 2016-11-01 DIAGNOSIS — E118 Type 2 diabetes mellitus with unspecified complications: Secondary | ICD-10-CM

## 2016-11-01 DIAGNOSIS — E1151 Type 2 diabetes mellitus with diabetic peripheral angiopathy without gangrene: Secondary | ICD-10-CM | POA: Diagnosis present

## 2016-11-01 DIAGNOSIS — E119 Type 2 diabetes mellitus without complications: Secondary | ICD-10-CM

## 2016-11-01 DIAGNOSIS — I5030 Unspecified diastolic (congestive) heart failure: Secondary | ICD-10-CM | POA: Diagnosis present

## 2016-11-01 DIAGNOSIS — Z91041 Radiographic dye allergy status: Secondary | ICD-10-CM

## 2016-11-01 DIAGNOSIS — Z9851 Tubal ligation status: Secondary | ICD-10-CM | POA: Diagnosis not present

## 2016-11-01 DIAGNOSIS — I739 Peripheral vascular disease, unspecified: Secondary | ICD-10-CM

## 2016-11-01 DIAGNOSIS — I214 Non-ST elevation (NSTEMI) myocardial infarction: Principal | ICD-10-CM | POA: Diagnosis present

## 2016-11-01 DIAGNOSIS — Z9861 Coronary angioplasty status: Secondary | ICD-10-CM | POA: Diagnosis not present

## 2016-11-01 DIAGNOSIS — R079 Chest pain, unspecified: Secondary | ICD-10-CM | POA: Diagnosis not present

## 2016-11-01 DIAGNOSIS — I25110 Atherosclerotic heart disease of native coronary artery with unstable angina pectoris: Secondary | ICD-10-CM | POA: Diagnosis not present

## 2016-11-01 HISTORY — PX: CARDIAC CATHETERIZATION: SHX172

## 2016-11-01 HISTORY — DX: Non-ST elevation (NSTEMI) myocardial infarction: I21.4

## 2016-11-01 LAB — CBC WITH DIFFERENTIAL/PLATELET
Basophils Absolute: 0 10*3/uL (ref 0.0–0.1)
Basophils Relative: 0 %
EOS ABS: 0.2 10*3/uL (ref 0.0–0.7)
Eosinophils Relative: 3 %
HEMATOCRIT: 38.8 % (ref 36.0–46.0)
Hemoglobin: 11.9 g/dL — ABNORMAL LOW (ref 12.0–15.0)
LYMPHS ABS: 1.2 10*3/uL (ref 0.7–4.0)
Lymphocytes Relative: 19 %
MCH: 26.3 pg (ref 26.0–34.0)
MCHC: 30.7 g/dL (ref 30.0–36.0)
MCV: 85.8 fL (ref 78.0–100.0)
Monocytes Absolute: 0.4 10*3/uL (ref 0.1–1.0)
Monocytes Relative: 6 %
NEUTROS ABS: 4.5 10*3/uL (ref 1.7–7.7)
NEUTROS PCT: 72 %
Platelets: 253 10*3/uL (ref 150–400)
RBC: 4.52 MIL/uL (ref 3.87–5.11)
RDW: 14.1 % (ref 11.5–15.5)
WBC: 6.4 10*3/uL (ref 4.0–10.5)

## 2016-11-01 LAB — BASIC METABOLIC PANEL
ANION GAP: 7 (ref 5–15)
BUN: 13 mg/dL (ref 6–20)
CALCIUM: 9.2 mg/dL (ref 8.9–10.3)
CHLORIDE: 105 mmol/L (ref 101–111)
CO2: 28 mmol/L (ref 22–32)
Creatinine, Ser: 0.78 mg/dL (ref 0.44–1.00)
GFR calc non Af Amer: >60 mL/min (ref >60)
Glucose, Bld: 124 mg/dL — ABNORMAL HIGH (ref 65–99)
POTASSIUM: 4.3 mmol/L (ref 3.5–5.1)
Sodium: 140 mmol/L (ref 135–145)

## 2016-11-01 LAB — POCT ACTIVATED CLOTTING TIME
ACTIVATED CLOTTING TIME: 241 s
Activated Clotting Time: 208 seconds
Activated Clotting Time: 186 seconds
Activated Clotting Time: 158 seconds

## 2016-11-01 LAB — PROTIME-INR
INR: 1.07
PROTHROMBIN TIME: 14 s (ref 11.4–15.2)

## 2016-11-01 LAB — GLUCOSE, CAPILLARY
GLUCOSE-CAPILLARY: 106 mg/dL — AB (ref 65–99)
GLUCOSE-CAPILLARY: 199 mg/dL — AB (ref 65–99)

## 2016-11-01 LAB — TROPONIN I: Troponin I: 0.37 ng/mL (ref <0.03)

## 2016-11-01 SURGERY — LEFT HEART CATH AND CORS/GRAFTS ANGIOGRAPHY

## 2016-11-01 MED ORDER — SODIUM CHLORIDE 0.9% FLUSH: 3.0000 mL | INTRAVENOUS | Status: DC | PRN

## 2016-11-01 MED ORDER — ROSUVASTATIN CALCIUM 20 MG PO TABS: 20.0000 mg | ORAL_TABLET | Freq: Every evening | ORAL | Status: DC

## 2016-11-01 MED ORDER — NITROGLYCERIN IN D5W 200-5 MCG/ML-% IV SOLN
10.0000 ug/min | INTRAVENOUS | Status: DC
  Administered 2016-11-01: 10 ug/min via INTRAVENOUS
  Filled 2016-11-01: qty 250

## 2016-11-01 MED ORDER — IOPAMIDOL (ISOVUE-370) INJECTION 76%
INTRAVENOUS | Status: AC
  Filled 2016-11-01: qty 50

## 2016-11-01 MED ORDER — ASPIRIN 81 MG PO CHEW
324.0000 mg | CHEWABLE_TABLET | Freq: Once | ORAL | Status: DC
  Filled 2016-11-01: qty 4

## 2016-11-01 MED ORDER — AMLODIPINE BESYLATE 5 MG PO TABS
5.0000 mg | ORAL_TABLET | Freq: Every day | ORAL | Status: DC
  Administered 2016-11-02: 09:00:00 5 mg via ORAL
  Filled 2016-11-01: qty 1

## 2016-11-01 MED ORDER — ONDANSETRON HCL 4 MG/2ML IJ SOLN
INTRAMUSCULAR | Status: AC
  Filled 2016-11-01: qty 2

## 2016-11-01 MED ORDER — EZETIMIBE 10 MG PO TABS
10.0000 mg | ORAL_TABLET | Freq: Every day | ORAL | Status: DC
  Administered 2016-11-02: 09:00:00 10 mg via ORAL
  Filled 2016-11-01: qty 1

## 2016-11-01 MED ORDER — METHYLPREDNISOLONE SODIUM SUCC 125 MG IJ SOLR
125.0000 mg | Freq: Once | INTRAMUSCULAR | Status: AC
  Administered 2016-11-01: 125 mg via INTRAVENOUS

## 2016-11-01 MED ORDER — HEPARIN SODIUM (PORCINE) 5000 UNIT/ML IJ SOLN
5000.0000 [IU] | Freq: Three times a day (TID) | INTRAMUSCULAR | Status: DC
  Administered 2016-11-02: 07:00:00 5000 [IU] via SUBCUTANEOUS
  Filled 2016-11-01: qty 1

## 2016-11-01 MED ORDER — LIDOCAINE HCL (PF) 1 % IJ SOLN
INTRAMUSCULAR | Status: DC | PRN
  Administered 2016-11-01: 2 mL
  Administered 2016-11-01: 15 mL

## 2016-11-01 MED ORDER — CLOPIDOGREL BISULFATE 75 MG PO TABS
75.0000 mg | ORAL_TABLET | Freq: Every day | ORAL | Status: DC
  Administered 2016-11-02: 09:00:00 75 mg via ORAL
  Filled 2016-11-01: qty 1

## 2016-11-01 MED ORDER — ONDANSETRON HCL 4 MG/2ML IJ SOLN
INTRAMUSCULAR | Status: DC | PRN
  Administered 2016-11-01: 4 mg via INTRAVENOUS

## 2016-11-01 MED ORDER — FENTANYL CITRATE (PF) 100 MCG/2ML IJ SOLN
INTRAMUSCULAR | Status: DC | PRN
  Administered 2016-11-01: 50 ug via INTRAVENOUS

## 2016-11-01 MED ORDER — FAMOTIDINE IN NACL 20-0.9 MG/50ML-% IV SOLN
20.0000 mg | Freq: Once | INTRAVENOUS | Status: AC
  Administered 2016-11-01: 20 mg via INTRAVENOUS

## 2016-11-01 MED ORDER — ACETAMINOPHEN 500 MG PO TABS
ORAL_TABLET | ORAL | Status: AC
  Filled 2016-11-01: qty 1

## 2016-11-01 MED ORDER — IOPAMIDOL (ISOVUE-370) INJECTION 76%
INTRAVENOUS | Status: DC | PRN
  Administered 2016-11-01: 175 mL via INTRA_ARTERIAL

## 2016-11-01 MED ORDER — DIPHENHYDRAMINE HCL 50 MG/ML IJ SOLN
25.0000 mg | Freq: Once | INTRAMUSCULAR | Status: AC
  Administered 2016-11-01: 25 mg via INTRAVENOUS

## 2016-11-01 MED ORDER — FENTANYL CITRATE (PF) 100 MCG/2ML IJ SOLN
INTRAMUSCULAR | Status: AC
  Filled 2016-11-01: qty 2

## 2016-11-01 MED ORDER — CARVEDILOL 3.125 MG PO TABS
6.2500 mg | ORAL_TABLET | Freq: Two times a day (BID) | ORAL | Status: DC
  Administered 2016-11-02: 6.25 mg via ORAL
  Filled 2016-11-01: qty 2

## 2016-11-01 MED ORDER — IOPAMIDOL (ISOVUE-370) INJECTION 76%
INTRAVENOUS | Status: AC
  Filled 2016-11-01: qty 100

## 2016-11-01 MED ORDER — HEPARIN (PORCINE) IN NACL 2-0.9 UNIT/ML-% IJ SOLN
INTRAMUSCULAR | Status: DC | PRN
  Administered 2016-11-01: 1000 mL

## 2016-11-01 MED ORDER — HYDRALAZINE HCL 20 MG/ML IJ SOLN: 10.0000 mg | INTRAMUSCULAR | Status: DC | PRN

## 2016-11-01 MED ORDER — HYDRALAZINE HCL 20 MG/ML IJ SOLN
INTRAMUSCULAR | Status: AC
  Filled 2016-11-01: qty 1

## 2016-11-01 MED ORDER — HEPARIN BOLUS VIA INFUSION
4000.0000 [IU] | Freq: Once | INTRAVENOUS | Status: AC
  Administered 2016-11-01: 4000 [IU] via INTRAVENOUS
  Filled 2016-11-01: qty 4000

## 2016-11-01 MED ORDER — DIPHENHYDRAMINE HCL 50 MG/ML IJ SOLN
INTRAMUSCULAR | Status: AC
  Filled 2016-11-01: qty 1

## 2016-11-01 MED ORDER — NITROGLYCERIN 0.4 MG SL SUBL
0.4000 mg | SUBLINGUAL_TABLET | SUBLINGUAL | Status: AC | PRN
  Administered 2016-11-01 (×3): 0.4 mg via SUBLINGUAL
  Filled 2016-11-01: qty 1

## 2016-11-01 MED ORDER — SODIUM CHLORIDE 0.9 % WEIGHT BASED INFUSION
1.0000 mL/kg/h | INTRAVENOUS | Status: AC
Start: 2016-11-01 — End: 2016-11-01

## 2016-11-01 MED ORDER — SODIUM CHLORIDE 0.9 % IV SOLN: 250.0000 mL | INTRAVENOUS | Status: DC | PRN

## 2016-11-01 MED ORDER — ATROPINE SULFATE 1 MG/10ML IJ SOSY
0.5000 mg | PREFILLED_SYRINGE | Freq: Once | INTRAMUSCULAR | Status: AC
  Administered 2016-11-01: 0.5 mg via INTRAVENOUS

## 2016-11-01 MED ORDER — ONDANSETRON HCL 4 MG/2ML IJ SOLN
4.0000 mg | Freq: Four times a day (QID) | INTRAMUSCULAR | Status: DC | PRN
Start: 2016-11-01 — End: 2016-11-02

## 2016-11-01 MED ORDER — ACETAMINOPHEN 500 MG PO TABS
500.0000 mg | ORAL_TABLET | Freq: Four times a day (QID) | ORAL | Status: DC | PRN
  Administered 2016-11-01 – 2016-11-02 (×2): 500 mg via ORAL
  Filled 2016-11-01: qty 1

## 2016-11-01 MED ORDER — ATROPINE SULFATE 1 MG/10ML IJ SOSY
PREFILLED_SYRINGE | INTRAMUSCULAR | Status: AC
  Filled 2016-11-01: qty 10

## 2016-11-01 MED ORDER — HEPARIN SODIUM (PORCINE) 1000 UNIT/ML IJ SOLN
INTRAMUSCULAR | Status: DC | PRN
  Administered 2016-11-01: 4000 [IU] via INTRAVENOUS

## 2016-11-01 MED ORDER — HYDRALAZINE HCL 20 MG/ML IJ SOLN
INTRAMUSCULAR | Status: DC | PRN
  Administered 2016-11-01 (×2): 10 mg via INTRAVENOUS

## 2016-11-01 MED ORDER — HEPARIN (PORCINE) IN NACL 100-0.45 UNIT/ML-% IJ SOLN
900.0000 [IU]/h | INTRAMUSCULAR | Status: DC
  Administered 2016-11-01: 900 [IU]/h via INTRAVENOUS
  Filled 2016-11-01: qty 250

## 2016-11-01 MED ORDER — IOPAMIDOL (ISOVUE-370) INJECTION 76%
INTRAVENOUS | Status: AC
  Filled 2016-11-01: qty 125

## 2016-11-01 MED ORDER — GLIMEPIRIDE 4 MG PO TABS
6.0000 mg | ORAL_TABLET | Freq: Every day | ORAL | Status: DC
  Administered 2016-11-02: 6 mg via ORAL
  Filled 2016-11-01: qty 1

## 2016-11-01 MED ORDER — HEPARIN (PORCINE) IN NACL 2-0.9 UNIT/ML-% IJ SOLN
INTRAMUSCULAR | Status: AC
  Filled 2016-11-01: qty 1000

## 2016-11-01 MED ORDER — LIDOCAINE HCL (PF) 1 % IJ SOLN
INTRAMUSCULAR | Status: AC
  Filled 2016-11-01: qty 30

## 2016-11-01 MED ORDER — ONDANSETRON HCL 4 MG/2ML IJ SOLN
INTRAMUSCULAR | Status: AC
  Administered 2016-11-01: 21:00:00
  Filled 2016-11-01: qty 2

## 2016-11-01 MED ORDER — FAMOTIDINE IN NACL 20-0.9 MG/50ML-% IV SOLN
INTRAVENOUS | Status: AC
  Filled 2016-11-01: qty 50

## 2016-11-01 MED ORDER — PANTOPRAZOLE SODIUM 40 MG PO TBEC
40.0000 mg | DELAYED_RELEASE_TABLET | Freq: Every day | ORAL | Status: DC
  Administered 2016-11-02: 40 mg via ORAL
  Filled 2016-11-01: qty 1

## 2016-11-01 MED ORDER — CARVEDILOL 3.125 MG PO TABS: 3.1250 mg | ORAL_TABLET | Freq: Two times a day (BID) | ORAL | Status: DC

## 2016-11-01 MED ORDER — METHYLPREDNISOLONE SODIUM SUCC 125 MG IJ SOLR
INTRAMUSCULAR | Status: AC
  Filled 2016-11-01: qty 2

## 2016-11-01 MED ORDER — SODIUM CHLORIDE 0.9% FLUSH: 3.0000 mL | Freq: Two times a day (BID) | INTRAVENOUS | Status: DC

## 2016-11-01 SURGICAL SUPPLY — 14 items
CATH INFINITI MULTIPACK ST 5F (CATHETERS) ×1 IMPLANT
CATH OPTITORQUE TIG 4.0 5F (CATHETERS) ×1 IMPLANT
DEVICE RAD COMP TR BAND LRG (VASCULAR PRODUCTS) ×2 IMPLANT
GLIDESHEATH SLEND A-KIT 6F 22G (SHEATH) ×1 IMPLANT
GUIDEWIRE INQWIRE 1.5J.035X260 (WIRE) IMPLANT
INQWIRE 1.5J .035X260CM (WIRE) ×2
KIT HEART LEFT (KITS) ×2 IMPLANT
PACK CARDIAC CATHETERIZATION (CUSTOM PROCEDURE TRAY) ×2 IMPLANT
SET INTRODUCER MICROPUNCT 5F (INTRODUCER) ×2 IMPLANT
SHEATH PINNACLE 5F 10CM (SHEATH) ×2 IMPLANT
TRANSDUCER W/STOPCOCK (MISCELLANEOUS) ×2 IMPLANT
TUBING CIL FLEX 10 FLL-RA (TUBING) ×2 IMPLANT
WIRE EMERALD 3MM-J .035X150CM (WIRE) ×1 IMPLANT
WIRE HI TORQ VERSACORE-J 145CM (WIRE) ×2 IMPLANT

## 2016-11-01 NOTE — ED Triage Notes (Addendum)
Pt to ED via [facility] from home, with c/o chest pain that started at 0700-- pt took 3 baby asa, and an alka selzer without relief-- received NTG enroute-- x 4. Brought pain from 8/10 to 1/10 at present.  Pain substernal "like someone sitting on it" no nausea, had 1 episode of diarrhea.  Pt has been belching "it makes pain feel better"

## 2016-11-01 NOTE — ED Notes (Signed)
Pt states pain continues unchanged after nitro x 3. Pt states pain 3/10. Pt does feel pain improves with belching.

## 2016-11-01 NOTE — ED Provider Notes (Signed)
[facility] DEPT Provider Note   CSN: [id number] Arrival date & time: 11/01/16  [id number]     History   Chief Complaint Chief Complaint  Patient presents with  . Chest Pain    HPI [name] is a 82 y.o. female.  HPI  82 year old female presents With chest pain that started around 7 AM. Patient states it felt deep in her chest and it felt like someone was sitting on her chest. She felt like she needed to burp. She has burped once and felt a little bit better. She was given 4 nitroglycerin by EMS and her pain went from an 8 down to a 1. There is no shortness of breath, nausea, vomiting, or diaphoresis. She has a history of coronary artery disease but states she has not had an MI so she's not sure of this pain is similar. No leg swelling or leg pain.  Past Medical History:  Diagnosis Date  . Atherosclerotic heart disease native coronary artery w/angina pectoris ([place]) 1992   a. 1992 s/p PTCA of LAD;  b. 1998 CABG x 3; c. 07/2001 Cath: Sev LM/LAD/LCX dzs, 3/3 patent grafts; d. 11/2013 Cath: Known LM/LAD/LCX dzs, VG-OM1 nl, VG->D1 50, LIMA->LAD nl, RCA nl-->Med Rx.;;;e. MYOVIEW 04/2015: NO ISCHEMIA OR INFARCTION, NORMAL EF  . Bradycardia, mild briefly to the 40s, asymptomatic 05/16/2013  . Carotid arterial disease ([place])    a. 05/2014 Carotid U/S: No signif bilat ICA stenosis, >60% L ECA.  Marland Kitchen Chronic anemia   . DM (diabetes mellitus), type 2 with peripheral vascular complications (HCC)    On Oral medications.  . Essential hypertension   . GERD (gastroesophageal reflux disease)   . Hyperlipidemia with target LDL less than 70   . PAD (peripheral artery disease) ([place]) 05/2013; 09/2013   a. severe calcified POP-1 & TP Trunk Dz bilat;  b . 05/2013 Diamondback Rot Athrectomy (R POP) --> PTA R Pop, DES to R Peroneal;  c. 09/2013 PTA/Stenting of L Pop [name] - retrograde access);  d. 04/2014 ABI's: R/L: 1.1/1.1.  Marland Kitchen Peptic ulcer disease   . S/P CABG x 3 1998   LIMA-LAD, SVG-OM, SVG-D1  . Severe  claudication ([place]) 05/2013   Referred for Peripheral Angio (Dr. Gwenlyn Found --> Dr. Brunetta Jeans in [place])    Patient Active Problem List   Diagnosis Date Noted  . NSTEMI (non-ST elevated myocardial infarction) ([place]) 11/01/2016  . Arteriosclerosis of coronary artery 10/24/2015  . Diabetes mellitus ([place]) 10/24/2015  . HLD (hyperlipidemia) 10/24/2015  . Non-insulin dependent type 2 diabetes mellitus ([place]) 10/24/2015  . Angina, class I ([place]) 04/22/2015  . DOE (dyspnea on exertion) 04/22/2015  . Leg edema, left 04/22/2015  . Obesity (BMI 30-39.9) 09/06/2014  . Accumulation of fluid in tissues 03/21/2014  . Vertigo, central 01/02/2014  . Vertigo 01/02/2014  . Limb pain- echymosis, pain Lt radial artery after cath 12/11/2013  . Musculoskeletal chest pain 11/28/2013  . Dyslipidemia, goal LDL below 70 05/17/2013  . Essential hypertension 05/17/2013  . Claudication ([place]) 05/16/2013  . PTA/ Stent Rt popliteal and Rt peroneal artery 05/16/13 05/16/2013  . PAD,severe calcified pop and tibial peroneal trunk disease bilateraly   . ABDOMINAL PAIN RIGHT LOWER QUADRANT 10/07/2010  . CVA-STROKE 03/04/2010  . BARRETT'S ESOPHAGUS 03/04/2010  . FECAL INCONTINENCE 03/04/2010  . DM 01/14/2010  . ANEMIA OF CHRONIC DISEASE 01/14/2010  . Atherosclerotic heart disease of native coronary artery with angina pectoris ([address]) 01/14/2010  . DIVERTICULOSIS OF COLON 01/14/2010  . PERSONAL HISTORY  OF COLONIC POLYPS 01/14/2010  . [place] ALLERGY 01/14/2010    Past Surgical History:  Procedure Laterality Date  . ATHERECTOMY Right 05/15/2013   Procedure: ATHERECTOMY;  Surgeon: Lorretta Harp, MD;  Location: [facility] CATH LAB;  Service: Cardiovascular;  Laterality: Right;  popliteal  . BACK SURGERY    . CARDIAC CATHETERIZATION  07/17/01   2 v CAD with LM, circ, LAD, obtuse diag, mod stenosis of diag vein graft , mild stenosis of marg vein graft, nl EF, medical treatment  . CARDIAC CATHETERIZATION  11/2013   Widely patent  LIMA-LAD & SVG-OM with mild progression of proximal stenosis ~40-50% in SVG-D1; Widely patent native RCA with known severe native LCA disease  . CORONARY ANGIOPLASTY  1992   PCI to LAD  . CORONARY ARTERY BYPASS GRAFT  1998   LIMA-LAD, SVG-diagonal (none roughly 50% stenosis), SVG-OM  . LEFT HEART CATHETERIZATION WITH CORONARY ANGIOGRAM N/A 11/27/2013   Procedure: LEFT HEART CATHETERIZATION WITH CORONARY ANGIOGRAM;  Surgeon: [name], MD;  Location: [facility] CATH LAB;  Service: Cardiovascular;  Laterality: N/A;  . LOWER EXTREMITY ANGIOGRAM N/A 02/22/2013   Procedure: LOWER EXTREMITY ANGIOGRAM;  Surgeon: Lorretta Harp, MD;  Location: [facility] CATH LAB;  Service: Cardiovascular;  Laterality: N/A;  . LOWER EXTREMITY ANGIOGRAM N/A 07/20/2013   Procedure: LOWER EXTREMITY ANGIOGRAM;  Surgeon: Lorretta Harp, MD;  Location: [facility] CATH LAB;  Service: Cardiovascular;  Laterality: N/A;  . NM MYOVIEW LTD  04/03/2013; 5/26'/2016   Lexiscan: a)  Apical perfusion defect with no ischemia. Consider prior infarct versus breast attenuation.;; b) 5/'16: LOW RISK, Nl EF ~55-65%, No Ischemia /Infarction  . PERCUTANEOUS STENT INTERVENTION Right 05/15/2013   Procedure: PERCUTANEOUS STENT INTERVENTION;  Surgeon: Lorretta Harp, MD;  Location: [facility] CATH LAB;  Service: Cardiovascular;  Laterality: Right;  tibioperoneal trunk  . Peroneal Artery Stent  05/15/13   Diamondback rot. atherectomy of high grade segmental popliteal stenosis then Chocolate balloonPTA; then angiosculpt PTA of the prox peroneal with stent-Xpedition   . pv angio  07/20/13   high-grade calcified popliteal disease with one-vessel runoff via a small anterior tibial artery that has proximal disease as well, no intervention  . TRANSTHORACIC ECHOCARDIOGRAM  March 2012   Mild inferior-apical hypokinesis; EF 50-55%. Aortic sclerosis. PA pressure 30-40 mmHg.  Marland Kitchen TRANSTHORACIC ECHOCARDIOGRAM  02/2011; 04/2015   a) EF 50-55%, mild inf HK;; b)  Nl LV Size & Fxn EF 60-65%. mild  LVH, Gr 1 DD.  . TUBAL LIGATION      OB History    No data available       Home Medications    Prior to Admission medications   Medication Sig Start Date End Date Taking? Authorizing Provider  acetaminophen (TYLENOL) 500 MG tablet Take 1,000 mg by mouth every 6 (six) hours as needed.   Yes Historical Provider, MD  carvedilol (COREG) 3.125 MG tablet TAKE 1 TABLET (3.125 MG TOTAL) BY MOUTH 2 (TWO) TIMES DAILY. 12/10/15  Yes [name], MD  clopidogrel (PLAVIX) 75 MG tablet Take 75 mg by mouth daily.   Yes Historical Provider, MD  Coenzyme Q10 (CO Q 10 PO) Take 1 tablet by mouth daily.    Yes Historical Provider, MD  ezetimibe (ZETIA) 10 MG tablet Take 10 mg by mouth daily.   Yes Historical Provider, MD  glimepiride (AMARYL) 4 MG tablet Take 6 mg by mouth daily before breakfast.    Yes Historical Provider, MD  hydrOXYzine (ATARAX/VISTARIL) 25 MG tablet TAKE 1 TO 2  TABLETS BY MOUTH AT BEDTIME FOR ITCHING 10/13/15  Yes Historical Provider, MD  isosorbide mononitrate (IMDUR) 30 MG 24 hr tablet Take 30 mg by mouth daily.    Yes Historical Provider, MD  isosorbide mononitrate (IMDUR) 60 MG 24 hr tablet TAKE 1 TABLET (60 MG TOTAL) BY MOUTH DAILY. 06/22/16  Yes [name], MD  Multiple Vitamins-Minerals (CENTRUM SILVER PO) Take 1 tablet by mouth daily.   Yes Historical Provider, MD  pantoprazole (PROTONIX) 40 MG tablet TAKE 1 TABLET BY MOUTH DAILY 03/22/16  Yes [name], MD  rosuvastatin (CRESTOR) 20 MG tablet Take 20 mg by mouth every evening.    Yes Historical Provider, MD  TRULICITY 1.5 0000000 SOPN Inject 1.5 mg into the skin once a week. 09/13/16  Yes Historical Provider, MD  estradiol (ESTRACE) 1 MG tablet Take 1 mg by mouth daily.    Historical Provider, MD  furosemide (LASIX) 20 MG tablet Take 1 tablet by mouth daily. Take 1 tab daily 05/19/15   Historical Provider, MD  saxagliptin HCl (ONGLYZA) 5 MG TABS tablet Take 5 mg by mouth daily.    Historical Provider, MD    Family  History Family History  Problem Relation Age of Onset  . Hypertension Mother   . Hyperlipidemia Mother   . Hyperlipidemia Father   . Hypertension Father     Social History Social History  Substance Use Topics  . Smoking status: Never Smoker  . Smokeless tobacco: Never Used  . Alcohol use No     Comment: rare glass of wine      Allergies   Fish allergy; Ivp dye [iodinated diagnostic agents]; Latex; Shellfish-derived products; Metformin; and Other   Review of Systems Review of Systems  Constitutional: Negative for diaphoresis.  Respiratory: Negative for shortness of breath.   Cardiovascular: Positive for chest pain. Negative for leg swelling.  Gastrointestinal: Negative for nausea and vomiting.  All other systems reviewed and are negative.    Physical Exam Updated Vital Signs BP (!) 182/155   Pulse 63   Temp 98 F (36.7 C) (Oral)   Resp 16   Ht 5' 1.81" (1.57 m) Comment: per last visit  Wt 176 lb 5.9 oz (80 kg) Comment: per previous visit  SpO2 99%   BMI 32.46 kg/m   Physical Exam  Constitutional: She is oriented to person, place, and time. She appears well-developed and well-nourished. No distress.  HENT:  Head: Normocephalic and atraumatic.  Right Ear: External ear normal.  Left Ear: External ear normal.  Nose: Nose normal.  Eyes: Right eye exhibits no discharge. Left eye exhibits no discharge.  Cardiovascular: Normal rate, regular rhythm and normal heart sounds.   Pulmonary/Chest: Effort normal and breath sounds normal. She exhibits no tenderness.  Abdominal: Soft. She exhibits no distension. There is no tenderness.  Musculoskeletal: She exhibits no edema.  Neurological: She is alert and oriented to person, place, and time.  Skin: Skin is warm and dry. She is not diaphoretic.  Nursing note and vitals reviewed.    ED Treatments / Results  Labs (all labs ordered are listed, but only abnormal results are displayed) Labs Reviewed  BASIC METABOLIC PANEL  - Abnormal; Notable for the following:       Result Value   Glucose, Bld 124 (*)    All other components within normal limits  TROPONIN I - Abnormal; Notable for the following:    Troponin I 0.37 (*)    All other components within normal limits  CBC WITH DIFFERENTIAL/PLATELET - Abnormal; Notable for the following:    Hemoglobin 11.9 (*)    All other components within normal limits  PROTIME-INR  HEPARIN LEVEL (UNFRACTIONATED)    EKG  EKG Interpretation  Date/Time:  Monday November 01 2016 10:07:44 EST Ventricular Rate:  76 PR Interval:    QRS Duration: 90 QT Interval:  391 QTC Calculation: 440 R Axis:   49 Text Interpretation:  Sinus rhythm Borderline repolarization abnormality Confirmed by Regenia Skeeter MD, Lawrnce Reyez [phone]) on 11/01/2016 10:28:36 AM       Radiology Dg Chest 2 View  Result Date: 11/01/2016 CLINICAL DATA:  Chest pain beginning this morning. EXAM: CHEST  2 VIEW COMPARISON:  03/02/2016 FINDINGS: Prior CABG. Low lung volumes. Right base atelectasis. Left lung is clear. No effusions or acute bony abnormality. Heart is normal size. IMPRESSION: Low lung volumes. Right base atelectasis. No real change since prior study. Electronically Signed   By: Rolm Baptise M.D.   On: 11/01/2016 11:08    Procedures Procedures (including critical care time)  CRITICAL CARE Performed by: Sherwood Gambler T   Total critical care time: 30 minutes  Critical care time was exclusive of separately billable procedures and treating other patients.  Critical care was necessary to treat or prevent imminent or life-threatening deterioration.  Critical care was time spent personally by me on the following activities: development of treatment plan with patient and/or surrogate as well as nursing, discussions with consultants, evaluation of patient's response to treatment, examination of patient, obtaining history from patient or surrogate, ordering and performing treatments and interventions,  ordering and review of laboratory studies, ordering and review of radiographic studies, pulse oximetry and re-evaluation of patient's condition.   Medications Ordered in ED Medications  aspirin chewable tablet 324 mg ( Oral MAR Hold 11/01/16 1543)  nitroGLYCERIN 50 mg in dextrose 5 % 250 mL (0.2 mg/mL) infusion ( Intravenous MAR Hold 11/01/16 1543)  heparin ADULT infusion 100 units/mL (25000 units/218mL sodium chloride 0.45%) (900 Units/hr Intravenous New Bag/Given 11/01/16 1306)  acetaminophen (TYLENOL) tablet 500 mg ( Oral MAR Hold 11/01/16 1543)  carvedilol (COREG) tablet 3.125 mg ( Oral Automatically Held 11/09/16 1700)  clopidogrel (PLAVIX) tablet 75 mg ( Oral Automatically Held 11/09/16 1000)  ezetimibe (ZETIA) tablet 10 mg ( Oral Automatically Held 11/09/16 1000)  glimepiride (AMARYL) tablet 6 mg ( Oral Automatically Held 11/10/16 0800)  pantoprazole (PROTONIX) EC tablet 40 mg ( Oral Automatically Held 11/09/16 1000)  rosuvastatin (CRESTOR) tablet 20 mg ( Oral Automatically Held 11/09/16 1800)  famotidine (PEPCID) IVPB 20 mg premix (20 mg Intravenous Given 11/01/16 1606)  nitroGLYCERIN (NITROSTAT) SL tablet 0.4 mg (0.4 mg Sublingual Given 11/01/16 1125)  heparin bolus via infusion 4,000 Units (4,000 Units Intravenous Bolus from Bag 11/01/16 1313)  diphenhydrAMINE (BENADRYL) injection 25 mg (25 mg Intravenous Given 11/01/16 1605)  methylPREDNISolone sodium succinate (SOLU-MEDROL) 125 mg/2 mL injection 125 mg (125 mg Intravenous Given 11/01/16 1606)     Initial Impression / Assessment and Plan / ED Course  I have reviewed the triage vital signs and the nursing notes.  Pertinent labs & imaging results that were available during my care of the patient were reviewed by me and considered in my medical decision making (see chart for details).  Clinical Course as of Nov 01 1606  Mon Nov 01, 2016  1027 Pain almost gone. Concern for angina vs ACS. Doubt PE with no dyspnea and no tachycardia  or other acute risk factors. Doubt dissection. Monitor, labs, CXR  [SG]  1247 Given elevated troponin and ongoing pain, placed on iv heparin and nitro. Consult cards for admission. D/w Wannetta Sender  [SG]    Clinical Course User Index [SG] Sherwood Gambler, MD    Patient is still having pain, was placed on a IV nitroglycerin drip. Also place on IV heparin. Troponin is mildly elevated but given this is early in the course I think she is having an NSTEMI. Patient will be admitted to cardiology.  Final Clinical Impressions(s) / ED Diagnoses   Final diagnoses:  NSTEMI (non-ST elevated myocardial infarction) [facility])    New Prescriptions Current Discharge Medication List       Sherwood Gambler, MD 11/01/16 [phone]

## 2016-11-01 NOTE — ED Notes (Signed)
Pt called out stating that she would like a nitro or something. Stating that her pain is now moderate, around a 4, but that she did say it was a 1. This NS asked her where she was hurting, she stated in her chest. Apolonio Schneiders, EMT, Dr. Regenia Skeeter EDP, & Chrislyn RN have been made aware.

## 2016-11-01 NOTE — ED Notes (Signed)
Pt to cath lab.

## 2016-11-01 NOTE — Progress Notes (Signed)
ANTICOAGULATION CONSULT NOTE - Initial Consult  Pharmacy Consult for heparin Indication: chest pain/ACS  Allergies  Allergen Reactions  . Fish Allergy Hives and Shortness Of Breath  . Iohexol Shortness Of Breath  . Ivp Dye [Iodinated Diagnostic Agents] Shortness Of Breath  . Latex Hives, Shortness Of Breath and Itching    REACTION: wheezing  . Shellfish Allergy Hives and Shortness Of Breath    All seafood  . Shellfish-Derived Products Anaphylaxis and Hives  . Glucophage [Metformin Hcl] Other (See Comments)    Instructed by physician not to take anymore   . Metformin Nausea And Vomiting  . Other     Patient reports allergy to perfumed detergents. Causes itching.    Patient Measurements: Height: 5' 1.81" (157 cm) (per last visit) Weight: 176 lb 5.9 oz (80 kg) (per previous visit) IBW/kg (Calculated) : 49.67 Heparin Dosing Weight: 67.5kg  Vital Signs: Temp: 98 F (36.7 C) (11/20 1011) Temp Source: Oral (11/20 1011) BP: 180/74 (11/20 1232) Pulse Rate: 69 (11/20 1232)  Labs:  Recent Labs  11/01/16 1010  HGB 11.9*  HCT 38.8  PLT 253  CREATININE 0.78  TROPONINI 0.37*    Estimated Creatinine Clearance: 54.7 mL/min (by C-G formula based on SCr of 0.78 mg/dL).   Medical History: Past Medical History:  Diagnosis Date  . Atherosclerotic heart disease native coronary artery w/angina pectoris ([place]) 1992   a. 1992 s/p PTCA of LAD;  b. 1998 CABG x 3; c. 07/2001 Cath: Sev LM/LAD/LCX dzs, 3/3 patent grafts; d. 11/2013 Cath: Known LM/LAD/LCX dzs, VG-OM1 nl, VG->D1 50, LIMA->LAD nl, RCA nl-->Med Rx.;;;e. MYOVIEW 04/2015: NO ISCHEMIA OR INFARCTION, NORMAL EF  . Bradycardia, mild briefly to the 40s, asymptomatic 05/16/2013  . Carotid arterial disease ([place])    a. 05/2014 Carotid U/S: No signif bilat ICA stenosis, >60% L ECA.  Marland Kitchen Chronic anemia   . DM (diabetes mellitus), type 2 with peripheral vascular complications (HCC)    On Oral medications.  . Essential hypertension   . GERD  (gastroesophageal reflux disease)   . Hyperlipidemia with target LDL less than 70   . PAD (peripheral artery disease) ([place]) 05/2013; 09/2013   a. severe calcified POP-1 & TP Trunk Dz bilat;  b . 05/2013 Diamondback Rot Athrectomy (R POP) --> PTA R Pop, DES to R Peroneal;  c. 09/2013 PTA/Stenting of L Pop [name] - retrograde access);  d. 04/2014 ABI's: R/L: 1.1/1.1.  Marland Kitchen Peptic ulcer disease   . S/P CABG x 3 1998   LIMA-LAD, SVG-OM, SVG-D1  . Severe claudication ([place]) 05/2013   Referred for Peripheral Angio (Dr. Gwenlyn Found --> Dr. Brunetta Jeans in [place])    Medications:  Infusions:  . heparin    . nitroGLYCERIN      Assessment: 39 yof presented to the ED with CP. Troponin found to be elevated. To start IV heparin. Baseline Hgb is slightly low and platelets are WNL. She is not on anticoagulation PTA.   Goal of Therapy:  Heparin level 0.3-0.7 units/ml Monitor platelets by anticoagulation protocol: Yes   Plan:  - Heparin bolus 4000 units IV x 1 - Heparin gtt 900 units/hr - Check an 8 hr heparin level - Daily heparin level and CBC  [name], [name] 11/01/2016,12:47 PM

## 2016-11-01 NOTE — H&P (Signed)
Patient ID: [name] MRN: [id number], DOB/AGE: 80-Oct-1934   Admit date: 11/01/2016   Primary Physician: Dwan Bolt, MD Primary Cardiologist: [name] PV: Dr. Gwenlyn Found   Pt. Profile:  82 y/o female with h/o CAD s/p CABG x 3 in 1998, PVD, HTN, HLD and T2DM presenting to the ED with CP and + troponin, ruling in for NSTEMI.   Problem List  Past Medical History:  Diagnosis Date  . Atherosclerotic heart disease native coronary artery w/angina pectoris ([place]) 1992   a. 1992 s/p PTCA of LAD;  b. 1998 CABG x 3; c. 07/2001 Cath: Sev LM/LAD/LCX dzs, 3/3 patent grafts; d. 11/2013 Cath: Known LM/LAD/LCX dzs, VG-OM1 nl, VG->D1 50, LIMA->LAD nl, RCA nl-->Med Rx.;;;e. MYOVIEW 04/2015: NO ISCHEMIA OR INFARCTION, NORMAL EF  . Bradycardia, mild briefly to the 40s, asymptomatic 05/16/2013  . Carotid arterial disease ([place])    a. 05/2014 Carotid U/S: No signif bilat ICA stenosis, >60% L ECA.  Marland Kitchen Chronic anemia   . DM (diabetes mellitus), type 2 with peripheral vascular complications (HCC)    On Oral medications.  . Essential hypertension   . GERD (gastroesophageal reflux disease)   . Hyperlipidemia with target LDL less than 70   . PAD (peripheral artery disease) ([place]) 05/2013; 09/2013   a. severe calcified POP-1 & TP Trunk Dz bilat;  b . 05/2013 Diamondback Rot Athrectomy (R POP) --> PTA R Pop, DES to R Peroneal;  c. 09/2013 PTA/Stenting of L Pop [name] - retrograde access);  d. 04/2014 ABI's: R/L: 1.1/1.1.  Marland Kitchen Peptic ulcer disease   . S/P CABG x 3 1998   LIMA-LAD, SVG-OM, SVG-D1  . Severe claudication ([place]) 05/2013   Referred for Peripheral Angio (Dr. Gwenlyn Found --> Dr. Brunetta Jeans in [place])    Past Surgical History:  Procedure Laterality Date  . ATHERECTOMY Right 05/15/2013   Procedure: ATHERECTOMY;  Surgeon: Lorretta Harp, MD;  Location: [facility] CATH LAB;  Service: Cardiovascular;  Laterality: Right;  popliteal  . BACK SURGERY    . CARDIAC CATHETERIZATION  07/17/01   2 v CAD with LM, circ, LAD,  obtuse diag, mod stenosis of diag vein graft , mild stenosis of marg vein graft, nl EF, medical treatment  . CARDIAC CATHETERIZATION  11/2013   Widely patent LIMA-LAD & SVG-OM with mild progression of proximal stenosis ~40-50% in SVG-D1; Widely patent native RCA with known severe native LCA disease  . CORONARY ANGIOPLASTY  1992   PCI to LAD  . CORONARY ARTERY BYPASS GRAFT  1998   LIMA-LAD, SVG-diagonal (none roughly 50% stenosis), SVG-OM  . LEFT HEART CATHETERIZATION WITH CORONARY ANGIOGRAM N/A 11/27/2013   Procedure: LEFT HEART CATHETERIZATION WITH CORONARY ANGIOGRAM;  Surgeon: [name], MD;  Location: [facility] CATH LAB;  Service: Cardiovascular;  Laterality: N/A;  . LOWER EXTREMITY ANGIOGRAM N/A 02/22/2013   Procedure: LOWER EXTREMITY ANGIOGRAM;  Surgeon: Lorretta Harp, MD;  Location: [facility] CATH LAB;  Service: Cardiovascular;  Laterality: N/A;  . LOWER EXTREMITY ANGIOGRAM N/A 07/20/2013   Procedure: LOWER EXTREMITY ANGIOGRAM;  Surgeon: Lorretta Harp, MD;  Location: [facility] CATH LAB;  Service: Cardiovascular;  Laterality: N/A;  . NM MYOVIEW LTD  04/03/2013; 5/26'/2016   Lexiscan: a)  Apical perfusion defect with no ischemia. Consider prior infarct versus breast attenuation.;; b) 5/'16: LOW RISK, Nl EF ~55-65%, No Ischemia /Infarction  . PERCUTANEOUS STENT INTERVENTION Right 05/15/2013   Procedure: PERCUTANEOUS STENT INTERVENTION;  Surgeon: Lorretta Harp, MD;  Location: [facility] CATH LAB;  Service: Cardiovascular;  Laterality: Right;  tibioperoneal trunk  . Peroneal Artery Stent  05/15/13   Diamondback rot. atherectomy of high grade segmental popliteal stenosis then Chocolate balloonPTA; then angiosculpt PTA of the prox peroneal with stent-Xpedition   . pv angio  07/20/13   high-grade calcified popliteal disease with one-vessel runoff via a small anterior tibial artery that has proximal disease as well, no intervention  . TRANSTHORACIC ECHOCARDIOGRAM  March 2012   Mild inferior-apical hypokinesis; EF 50-55%.  Aortic sclerosis. PA pressure 30-40 mmHg.  Marland Kitchen TRANSTHORACIC ECHOCARDIOGRAM  02/2011; 04/2015   a) EF 50-55%, mild inf HK;; b)  Nl LV Size & Fxn EF 60-65%. mild LVH, Gr 1 DD.  . TUBAL LIGATION       Allergies  Allergies  Allergen Reactions  . Fish Allergy Hives and Shortness Of Breath  . Iohexol Shortness Of Breath  . Ivp Dye [Iodinated Diagnostic Agents] Shortness Of Breath  . Latex Hives, Shortness Of Breath and Itching    REACTION: wheezing  . Shellfish Allergy Hives and Shortness Of Breath    All seafood  . Shellfish-Derived Products Anaphylaxis and Hives  . Glucophage [Metformin Hcl] Other (See Comments)    Instructed by physician not to take anymore   . Metformin Nausea And Vomiting  . Other     Patient reports allergy to perfumed detergents. Causes itching.    HPI  82 y/o female followed by [name] for CAD s/p CABG in 1998 (LIMA-LAD, SVG-OM, SVG-D1). Also with h/o PVD, followed by Dr. Gwenlyn Found, with bilateral SFA disease, as well as h/o HTN, HLD (intolerant to PCSK9 inhibitors due to rash, now tolerating Crestor + Zetia) and T2DM.   Her last LHC was 11/2013 showed widely patent LIMA-LAD & SVG-OM with mild progression of proximal stenosis ~40-50% in SVG-D1 and widely patent native RCA with known severe native LCA disease (not visualized). In May 2016, she had a NST that was negative for ischemia. She also had a 2D echo 04/2015 that showed normal LVEF at 60-65% and no RWMAs.    Regarding her PVD, she is s/p diamondback orbital rotational atherectomy, PTA of her distal right SFA and stenting of her peroneal artery which was her only patent runoff 05/16/13. She subsequently underwent recatheterization of her left leg by Dr. Brunetta Jeans in [place]. Her most recent Doppler study performed 11/04/15 revealed normal ABIs bilaterally with patent stents.  She presented to the [facility] ED this morning with a complaint of sudden onset SSCP that began around 7:30 am. Described as a heavy pressure in  her chest. Non radiating. No associated dyspnea, diaphoresis, n/v. She did have severe diarrhea this am. Pain was 10/10 at its worse. No improvement with baby ASA so she called her daughter who then called EMS. On arrival, she was still with active CP and was give SL NTG x 4. No significant improvement. She was brought to ED.   Initial troponin abnormal at 0.37. SCr and BUN normal at 0.78/13. CBC shows mild anemia with Hgb at 11.9. EKG shows SR with borderline repolarization abnormalities. CXR suggest low lung volumes with right base atelectasis. BP is elevated in the 0000000 systolic. She is on IV nitro. She has been started on IV heparin. Pain improved, now at 3/10.   Home Medications  Prior to Admission medications   Medication Sig Start Date End Date Taking? Authorizing Provider  acetaminophen (TYLENOL) 500 MG tablet Take 1,000 mg by mouth every 6 (six) hours as needed.   Yes Historical Provider, MD  carvedilol (  COREG) 3.125 MG tablet TAKE 1 TABLET (3.125 MG TOTAL) BY MOUTH 2 (TWO) TIMES DAILY. 12/10/15  Yes [name], MD  clopidogrel (PLAVIX) 75 MG tablet Take 75 mg by mouth daily.   Yes Historical Provider, MD  Coenzyme Q10 (CO Q 10 PO) Take 1 tablet by mouth daily.    Yes Historical Provider, MD  ezetimibe (ZETIA) 10 MG tablet Take 10 mg by mouth daily.   Yes Historical Provider, MD  glimepiride (AMARYL) 4 MG tablet Take 6 mg by mouth daily before breakfast.    Yes Historical Provider, MD  hydrOXYzine (ATARAX/VISTARIL) 25 MG tablet TAKE 1 TO 2 TABLETS BY MOUTH AT BEDTIME FOR ITCHING 10/13/15  Yes Historical Provider, MD  isosorbide mononitrate (IMDUR) 30 MG 24 hr tablet Take 30 mg by mouth daily.    Yes Historical Provider, MD  isosorbide mononitrate (IMDUR) 60 MG 24 hr tablet TAKE 1 TABLET (60 MG TOTAL) BY MOUTH DAILY. 06/22/16  Yes [name], MD  Multiple Vitamins-Minerals (CENTRUM SILVER PO) Take 1 tablet by mouth daily.   Yes Historical Provider, MD  pantoprazole (PROTONIX)  40 MG tablet TAKE 1 TABLET BY MOUTH DAILY 03/22/16  Yes [name], MD  rosuvastatin (CRESTOR) 20 MG tablet Take 20 mg by mouth every evening.    Yes Historical Provider, MD  TRULICITY 1.5 0000000 SOPN Inject 1.5 mg into the skin once a week. 09/13/16  Yes Historical Provider, MD  estradiol (ESTRACE) 1 MG tablet Take 1 mg by mouth daily.    Historical Provider, MD  furosemide (LASIX) 20 MG tablet Take 1 tablet by mouth daily. Take 1 tab daily 05/19/15   Historical Provider, MD  saxagliptin HCl (ONGLYZA) 5 MG TABS tablet Take 5 mg by mouth daily.    Historical Provider, MD    Family History  Family History  Problem Relation Age of Onset  . Hypertension Mother   . Hyperlipidemia Mother   . Hyperlipidemia Father   . Hypertension Father     Social History  Social History   Social History  . Marital status: Married    Spouse name: N/A  . Number of children: N/A  . Years of education: N/A   Occupational History  . Not on file.   Social History Main Topics  . Smoking status: Never Smoker  . Smokeless tobacco: Never Used  . Alcohol use No     Comment: rare glass of wine   . Drug use: No  . Sexual activity: Not on file   Other Topics Concern  . Not on file   Social History Narrative    She is a married mother of 83, grandmother of 18, great grandmother of 35.    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    She does not really get a lot of exercise,    She does not smoke or drink EtOH.     Review of Systems General:  No chills, fever, night sweats or weight changes.  Cardiovascular:  +chest pain, no dyspnea on exertion, edema, orthopnea, palpitations, paroxysmal nocturnal dyspnea. Dermatological: No rash, lesions/masses Respiratory: No cough, dyspnea Urologic: No hematuria, dysuria Abdominal:   No nausea, vomiting, diarrhea, bright red blood per rectum, melena, or hematemesis Neurologic:  No visual changes, wkns, changes in mental status. All other  systems reviewed and are otherwise negative except as noted above.  Physical Exam  Blood pressure 173/91, pulse 64, temperature 98 F (36.7 C), temperature source Oral, resp. rate  22, height 5' 1.81" (1.57 m), weight 176 lb 5.9 oz (80 kg), SpO2 100 %.  General: Pleasant, NAD Psych: Normal affect. Neuro: Alert and oriented X 3. Moves all extremities spontaneously. HEENT: Normal  Neck: Supple without bruits or JVD. Lungs:  Resp regular and unlabored, decreased BS bilaterally, ? Right sided atelectasis  Heart: RRR no s3, s4, or murmurs. Abdomen: Soft, non-tender, non-distended, BS + x 4.  Extremities: No clubbing, cyanosis or edema. DP/PT/Radials 2+ and equal bilaterally.  Labs  Troponin (Point of Care Test) No results for input(s): TROPIPOC in the last 72 hours.  Recent Labs  11/01/16 1010  TROPONINI 0.37*   Lab Results  Component Value Date   WBC 6.4 11/01/2016   HGB 11.9 (L) 11/01/2016   HCT 38.8 11/01/2016   MCV 85.8 11/01/2016   PLT 253 11/01/2016     Recent Labs Lab 11/01/16 1010  NA 140  K 4.3  CL 105  CO2 28  BUN 13  CREATININE 0.78  CALCIUM 9.2  GLUCOSE 124*   Lab Results  Component Value Date   CHOL 145 11/27/2013   HDL 45 11/27/2013   LDLCALC 85 11/27/2013   TRIG 77 11/27/2013   No results found for: DDIMER   Radiology/Studies  Dg Chest 2 View  Result Date: 11/01/2016 CLINICAL DATA:  Chest pain beginning this morning. EXAM: CHEST  2 VIEW COMPARISON:  03/02/2016 FINDINGS: Prior CABG. Low lung volumes. Right base atelectasis. Left lung is clear. No effusions or acute bony abnormality. Heart is normal size. IMPRESSION: Low lung volumes. Right base atelectasis. No real change since prior study. Electronically Signed   By: Rolm Baptise M.D.   On: 11/01/2016 11:08    ECG  NSR. No ischemia.   Echocardiogram  ASSESSMENT AND PLAN  Principal Problem:   NSTEMI (non-ST elevated myocardial infarction) ([place]) Active Problems:   Atherosclerotic heart  disease of native coronary artery with angina pectoris ([place])   Essential hypertension   Diabetes mellitus ([place])   HLD (hyperlipidemia)   1. NSTEMI/CAD: pt with known CAD s/p CABG x 3 in 1998 with cath in 2014 showing widely patent LIMA-LAD & SVG-OM with mild progression of proximal stenosis ~40-50% in SVG-D1 and widely patent native RCA with known severe native LCA disease. Now with unstable angina with elevated troponin at 0.37, ruling her in for NSTEMI. No ST changes. She is on IV heparin and IV nitro. Pain improved from 10/10>>3/10. BP remains elevated in the ED in the 170s-190s. Can further titrate IV nitro for CP and BP. Keep NPO. Will see if we can schedule possible LHC later today vs in the am. She has not eaten today. Continue home Plavix, BB and statin. Add ASA. Check 2D echo.   2. HTN: BP elevated in the [id number] systolic. Continue IV nitro. Can further titrate given mild ongoing CP. Resume home BB, Coreg 3.125 mg BID. Will monitor BP closely and will further titrate antihypertensives as needed.   3. HLD: on Crestor 20 mg. Check FLP in the am. If LDL is not at goal of < 70 mg d/L, will increase dose to 40 mg nightly.   4. DM: continue home oral agents, glimepiride and saxagliptin.   5. PVD: s/p bilateral SFA stenting. Patient notes bilateral leg pain and intermittent numbness/tingling. She has palpable DPs bilaterally. ? If diabetic neuropathy. She is due for outpatient bilateral LE dopplers.would prefer to treat cardiac issues first - then assess PV as OP  Signed, Lyda Jester, PA-C 11/01/2016, 1:15  PM    I have seen, examined and evaluated the patient this PM along with Ms. Rosita Fire, PA-C.  After reviewing all the available data and chart, we discussed the patients laboratory, study & physical findings as well as symptoms in detail. I agree with her findings, examination as well as impression recommendations as per our discussion.    Patient is well known to me with chronic  CAD-CABG (last nuc ~2016) along with complex PAD presenting with ACS-NSTEMI.   Still notes ~3-4/10 CP on NTG & IV Heparin.   At this point, I would prefer to recommend urgent cardiac cath as opposed to trying to "cool" her off overnight.  Suspect SVG closure as the culprit - getting to these sooner is better.  BP not controlled - continue IV NTG 0 will probably need to up-titrate BB dose. Monitor glycemic control - may need SSI. On Statin moderate dose (did not tolerate higher dose)..  Cath Consent.  The procedure with Risks/Benefits/Alternatives and Indications was reviewed with the patient & family.  All questions were answered.    Risks / Complications include, but not limited to: Death, MI, CVA/TIA, VF/VT (with defibrillation), Bradycardia (need for temporary pacer placement), contrast induced nephropathy, bleeding / bruising / hematoma / pseudoaneurysm, vascular or coronary injury (with possible emergent CT or Vascular Surgery), adverse medication reactions, infection.  Additional risks involving the use of radiation with the possibility of radiation burns and cancer were explained in detail.  The patient and family voice understanding and agree to proceed.       [name], M.D., M.S. Interventional Cardiologist   Pager # [phone] Phone # [phone] [address] [place], [place] [address]

## 2016-11-01 NOTE — Progress Notes (Signed)
TR BAND REMOVAL  LOCATION:    left radial  DEFLATED PER PROTOCOL:    Yes.    TIME BAND OFF / DRESSING APPLIED:    21:45   SITE UPON ARRIVAL:    Level 0  SITE AFTER BAND REMOVAL:    Level 0  CIRCULATION SENSATION AND MOVEMENT:    Within Normal Limits   Yes.    COMMENTS:   Post TR band instructions given. Pt tolerated well. 

## 2016-11-01 NOTE — Interval H&P Note (Signed)
History and Physical Interval Note:  11/01/2016 3:58 PM  [name]  has presented today for surgery, with the diagnosis of cp - n stemi  The various methods of treatment have been discussed with the patient and family. After consideration of risks, benefits and other options for treatment, the patient has consented to  Procedure(s): Left Heart Cath and Cors/Grafts Angiography (N/A) as a surgical intervention .  The patient's history has been reviewed, patient examined, no change in status, stable for surgery.  I have reviewed the patient's chart and labs.  Questions were answered to the patient's satisfaction.    Cath Lab Visit (complete for each Cath Lab visit)  Clinical Evaluation Leading to the Procedure:   ACS: Yes.    Non-ACS:    Anginal Classification: CCS IV  Anti-ischemic medical therapy: Maximal Therapy (2 or more classes of medications)  Non-Invasive Test Results: No non-invasive testing performed  Prior CABG: Previous CABG   [name]

## 2016-11-01 NOTE — ED Notes (Signed)
Patient transported to X-ray 

## 2016-11-01 NOTE — Progress Notes (Signed)
Site area: right groin  Site Prior to Removal:  Level 0  Pressure Applied For 16 MINUTES    Minutes Beginning at 20:30  Manual:   Yes.    Patient Status During Pull:  Pt vagal 5 minutes after sheath pulled.  Post Pull Groin Site:  Level 0  Post Pull Instructions Given:  Yes.    Post Pull Pulses Present:  Yes.    Dressing Applied:  Yes.    Comments:  Pt vagal 5 mins after sheath pulled. BP=75/45; nauseous, feels hot as per pt. NS 126ml bolus given as per MD. Zofran 4 mg IV & Atropine 0.5 mg IV given as per Dr. Jeannine Boga. Pls see flowsheet for V/S. Currently pt is stable & felt better. Will continue to monitor.

## 2016-11-01 NOTE — ED Notes (Signed)
Daughter states mother did  Not understand pain scale and after her daughter explained now states pain is 5/10. Pt states pain has  Not changed with administration  Of nitro.

## 2016-11-02 ENCOUNTER — Encounter (HOSPITAL_COMMUNITY): Payer: Self-pay | Admitting: Cardiology

## 2016-11-02 ENCOUNTER — Inpatient Hospital Stay (HOSPITAL_COMMUNITY): Payer: Medicare Other

## 2016-11-02 DIAGNOSIS — I5030 Unspecified diastolic (congestive) heart failure: Secondary | ICD-10-CM

## 2016-11-02 DIAGNOSIS — R079 Chest pain, unspecified: Secondary | ICD-10-CM

## 2016-11-02 DIAGNOSIS — I110 Hypertensive heart disease with heart failure: Secondary | ICD-10-CM

## 2016-11-02 LAB — GLUCOSE, CAPILLARY
GLUCOSE-CAPILLARY: 209 mg/dL — AB (ref 65–99)
GLUCOSE-CAPILLARY: 197 mg/dL — AB (ref 65–99)

## 2016-11-02 LAB — CBC
HEMATOCRIT: 41.2 % (ref 36.0–46.0)
HEMOGLOBIN: 12.9 g/dL (ref 12.0–15.0)
MCH: 26.4 pg (ref 26.0–34.0)
MCHC: 31.3 g/dL (ref 30.0–36.0)
MCV: 84.3 fL (ref 78.0–100.0)
Platelets: 279 10*3/uL (ref 150–400)
RBC: 4.89 MIL/uL (ref 3.87–5.11)
RDW: 14 % (ref 11.5–15.5)
WBC: 6.3 10*3/uL (ref 4.0–10.5)

## 2016-11-02 LAB — ECHOCARDIOGRAM COMPLETE
CHL CUP DOP CALC LVOT VTI: 23.7 cm
CHL CUP MV DEC (S): 342
CHL CUP RV SYS PRESS: 35 mmHg
CHL CUP TV REG PEAK VELOCITY: 261 cm/s
E/e' ratio: 8.93
EWDT: 342 ms
FS: 43 % (ref 28–44)
Height: 63 in
IV/PV OW: 1.32
LA ID, A-P, ES: 38 mm
LA vol A4C: 51.2 ml
LA vol index: 34.2 mL/m2
LA vol: 62.5 mL
LADIAMINDEX: 2.08 cm/m2
LDCA: 2.01 cm2
LEFT ATRIUM END SYS DIAM: 38 mm
LV E/e' medial: 8.93
LV PW d: 9.41 mm — AB (ref 0.6–1.1)
LV TDI E'LATERAL: 7.6
LV TDI E'MEDIAL: 5.51
LVEEAVG: 8.93
LVELAT: 7.6 cm/s
LVOT peak grad rest: 5 mmHg
LVOTD: 16 mm
LVOTPV: 112 cm/s
LVOTSV: 48 mL
MV pk A vel: 101 m/s
MVPKEVEL: 67.9 m/s
RV LATERAL S' VELOCITY: 8.27 cm/s
TAPSE: 15.6 mm
TRMAXVEL: 261 cm/s
Weight: 2821.89 oz

## 2016-11-02 LAB — BASIC METABOLIC PANEL
ANION GAP: 11 (ref 5–15)
BUN: 11 mg/dL (ref 6–20)
CHLORIDE: 102 mmol/L (ref 101–111)
CO2: 25 mmol/L (ref 22–32)
Calcium: 9.5 mg/dL (ref 8.9–10.3)
Creatinine, Ser: 0.99 mg/dL (ref 0.44–1.00)
GFR calc non Af Amer: 52 mL/min — ABNORMAL LOW (ref >60)
Glucose, Bld: 203 mg/dL — ABNORMAL HIGH (ref 65–99)
POTASSIUM: 4.1 mmol/L (ref 3.5–5.1)
SODIUM: 138 mmol/L (ref 135–145)

## 2016-11-02 LAB — TROPONIN I: TROPONIN I: 0.63 ng/mL — AB (ref <0.03)

## 2016-11-02 LAB — LIPID PANEL
Cholesterol: 173 mg/dL (ref 0–200)
HDL: 48 mg/dL (ref >40)
LDL Cholesterol: 114 mg/dL — ABNORMAL HIGH (ref 0–99)
Total CHOL/HDL Ratio: 3.6 RATIO
Triglycerides: 55 mg/dL (ref <150)
VLDL: 11 mg/dL (ref 0–40)

## 2016-11-02 MED ORDER — TRAMADOL HCL 50 MG PO TABS
50.0000 mg | ORAL_TABLET | Freq: Four times a day (QID) | ORAL | Status: DC | PRN
  Administered 2016-11-02: 09:00:00 50 mg via ORAL
  Filled 2016-11-02: qty 1

## 2016-11-02 MED ORDER — CARVEDILOL 6.25 MG PO TABS: 6.2500 mg | ORAL_TABLET | Freq: Two times a day (BID) | ORAL | 12 refills | Status: DC

## 2016-11-02 MED ORDER — AMLODIPINE BESYLATE 5 MG PO TABS: 5.0000 mg | ORAL_TABLET | Freq: Every day | ORAL | 12 refills | Status: DC

## 2016-11-02 MED ORDER — ISOSORBIDE MONONITRATE ER 30 MG PO TB24
30.0000 mg | ORAL_TABLET | Freq: Every day | ORAL | Status: DC
  Administered 2016-11-02: 30 mg via ORAL
  Filled 2016-11-02: qty 1

## 2016-11-02 NOTE — Care Management Note (Addendum)
Case Management Note  Patient Details  Name: [name] MRN: [id number] Date of Birth: 08/04/1933  Subjective/Objective:   S/p heart cath, she lives with spouse, who she is the primary care giver for.  PTA indep.  She has a pcp, and she has medication coverage , patient will have transportation at discharge.   NCM gave patient a private duty list for future refferance .                  Action/Plan:   Expected Discharge Date:                  Expected Discharge Plan:  Home/Self Care  In-House Referral:     Discharge planning Services  CM Consult  Post Acute Care Choice:    Choice offered to:     DME Arranged:    DME Agency:     HH Arranged:    HH Agency:     Status of Service:  Completed, signed off  If discussed at [employer] of Stay Meetings, dates discussed:    Additional Comments:  Monica Mayo, RN 11/02/2016, 10:09 AM

## 2016-11-02 NOTE — Progress Notes (Signed)
Patient Name: [name] Date of Encounter: 11/02/2016  Primary Cardiologist: Dr. Beckie Salts Problem List     Principal Problem:   NSTEMI (non-ST elevated myocardial infarction) [facility]) Active Problems:   Atherosclerotic heart disease of native coronary artery with angina pectoris ([place])   Accelerated hypertension with diastolic congestive heart failure, NYHA class 3 (HCC)   Diabetes mellitus ([place])   HLD (hyperlipidemia)    Subjective   Fee;s ok. Still having some chest tightness. Denies SOB.   Inpatient Medications    Scheduled Meds: . amLODipine  5 mg Oral Daily  . aspirin  324 mg Oral Once  . carvedilol  6.25 mg Oral BID WC  . clopidogrel  75 mg Oral Daily  . ezetimibe  10 mg Oral Daily  . glimepiride  6 mg Oral QAC breakfast  . heparin  5,000 Units Subcutaneous Q8H  . pantoprazole  40 mg Oral Daily  . rosuvastatin  20 mg Oral QPM  . sodium chloride flush  3 mL Intravenous Q12H   Continuous Infusions: . nitroGLYCERIN Stopped (11/01/16 1945)   PRN Meds: sodium chloride, acetaminophen, hydrALAZINE, ondansetron (ZOFRAN) IV, sodium chloride flush   Vital Signs    Vitals:   11/01/16 2045 11/01/16 2050 11/02/16 0226 11/02/16 0722  BP: (!) 97/43 112/68 (!) 152/57 131/62  Pulse: 87 86 64 93  Resp: (!) 21 20 19 15   Temp:   98.4 F (36.9 C) 98.1 F (36.7 C)  TempSrc:   Oral Oral  SpO2: 94% 96% 98% 97%  Weight:   176 lb 5.9 oz (80 kg)   Height:        Intake/Output Summary (Last 24 hours) at 11/02/16 0804 Last data filed at 11/01/16 2100  Gross per 24 hour  Intake              496 ml  Output              300 ml  Net              196 ml   Filed Weights   11/01/16 1232 11/01/16 1915 11/02/16 0226  Weight: 176 lb 5.9 oz (80 kg) 176 lb 5.9 oz (80 kg) 176 lb 5.9 oz (80 kg)    Physical Exam    General appearance: alert, cooperative, appears stated age, no distress and mildly obese  Neck: no adenopathy, no carotid bruit and no JVD  Lungs: clear to  auscultation bilaterally, normal percussion bilaterally and non-labored  Heart: RRR, normal S1, S2, no murmur, click, rub or gallop; nondisplaced PMI  Abdomen: soft, non-tender; bowel sounds normal; no masses, no organomegaly; mild obesity  Extremities: extremities normal, atraumatic, no cyanosis, or edema;There is some mild bruising on the left wrist.  Pulses: 2+ and symmetric upper extremities. Still has diminished pedal pulses, but stable.;  Neurologic: Mental status: Alert, oriented, thought content appropriate; Cranial nerves: normal (II-XII grossly intact)  Labs    CBC  Recent Labs  11/01/16 1010 11/02/16 0640  WBC 6.4 6.3  NEUTROABS 4.5  --   HGB 11.9* 12.9  HCT 38.8 41.2  MCV 85.8 84.3  PLT 253 123XX123   Basic Metabolic Panel  Recent Labs  11/01/16 1010 11/02/16 0640  NA 140 138  K 4.3 4.1  CL 105 102  CO2 28 25  GLUCOSE 124* 203*  BUN 13 11  CREATININE 0.78 0.99  CALCIUM 9.2 9.5   Cardiac Enzymes  Recent Labs  11/01/16 1010  TROPONINI 0.37*  Fasting Lipid Panel  Recent Labs  11/02/16 0640  CHOL 173  HDL 48  LDLCALC 114*  TRIG 55  CHOLHDL 3.6    Telemetry    NSR - Personally Reviewed  ECG    NSR with diffuse ST depression and T wave inversion.  - Personally Reviewed  Radiology    Dg Chest 2 View  Result Date: 11/01/2016 CLINICAL DATA:  Chest pain beginning this morning. EXAM: CHEST  2 VIEW COMPARISON:  03/02/2016 FINDINGS: Prior CABG. Low lung volumes. Right base atelectasis. Left lung is clear. No effusions or acute bony abnormality. Heart is normal size. IMPRESSION: Low lung volumes. Right base atelectasis. No real change since prior study. Electronically Signed   By: Rolm Baptise M.D.   On: 11/01/2016 11:08    Cardiac Studies   Left Heart Cath and Cors/Grafts Angiography [id number]  LV end diastolic pressure is mildly elevated. In the setting of systemic hypertension  SVG-diagonal is moderate in size. Origin lesion, 55  %stenosed.  LM lesion, 55 %stenosed.  Ost LAD lesion, 100 %stenosed.  Prox LAD to Mid LAD lesion, 100 %stenosed. After D1.  Very small caliber Ost Ramus lesion, 70 %stenosed.  Ost Cx to Prox Cx lesion, 80 %stenosed. Prox Cx lesion, 70 %stenosed.  SVG-OM is large and anatomically normal. The flow in the graft is reversed. The bifurcating Cx OM is relatively normal.  Very tortuous L Subclavian artery - unable to perform L Radial Cath.    Patient has relatively stable coronary disease with maybe mild progression of the vein graft to the diagonal branch ostial disease that was maybe 40% previously. This does not appear to be enough flow limiting to cause resting symptoms, however most likely etiology is accelerated hypertension, accelerated LVEDP and microvascular disease with a small ramus branch and other branches from the native system.  PLAN:   Admit to step down for ongoing blood pressure control. Will use IV nitroglycerin overnight. I will increase carvedilol to 6.25 twice a day and add amlodipine.  Continue aspirin, Plavix and statin  Can stop IV heparin  Patient Profile     [name] is a 82 year old female with a past medical history of CAD s/p CABG x 3 in 1998, PVD, HTN, and DM. Admitted on 11/01/16 with chest pain, was markedly hypertensive and ruled in for NSTEMI. Left heart cath showed no culprit lesion, only mild progression of disease in the vein graft to the diagonal.   Assessment & Plan    1. NSTEMI - related to Existing CAD & Accelerated HTN (type II MI): Presented with chest pain/heaviness in the setting of hypertension with known CAD s/p CABG. Angiographically she had stable anatomy with only mild progression of disease in her vein graft to the diagonal.   Continue ASA, Plavix and statin. Will add Imdur 30mg  today for ongoing resting angina likely due to small vessel disease.   2. Accelerated Hypertension: Coreg increased to 6.25mg  BID, and amlodipine was added.  Historically, her BP has been well controlled. She denies missing any medications. Consider renal artery ultrasound outpatient.   3. HLD: Continue statin.   Signed, Arbutus Leas, NP  11/02/2016, 8:04 AM   I have seen, examined and evaluated the patient this AM along with Jettie Booze.  After reviewing all the available data and chart, we discussed the patients laboratory, study & physical findings as well as symptoms in detail. I agree with her findings, examination as well as impression recommendations as per our discussion.  (  my exam).  She still has a little heaviness sensation in her chest that has been persistent, but does not have the significant pain she had yesterday. Her blood pressure is now stabilized and nitroglycerin has been discontinued. I do agree with starting Imdur back as she probably has some microvascular disease. She may actually benefit from Ranexa. I increased her carvedilol dose yesterday along with her Norvasc. Continue to monitor her blood pressure with these changes. Echocardiogram was being performed when I was seeing her, I did not see any significant regional wall motion abnormality is, however images were very difficult in the short axis view. Without any significant wall motion and amount, I suspect that her ongoing tightness in her chest is not related to microvascular ischemia.  Plan would be to angulate her now today. His lungs are proper stay stable and the echocardiogram does not show anything unexpected, and we can probably have her discharged later onset noon.   [name], M.D., M.S. Interventional Cardiologist   Pager # [phone] Phone # [phone] [address] [place], [place] [address]

## 2016-11-02 NOTE — Discharge Summary (Signed)
Discharge Summary    Patient ID: MIOSHA SUITT,  MRN: [id number], DOB/AGE: 07/23/1933 82 y.o.  Admit date: 11/01/2016 Discharge date: 11/02/2016  Primary Care Provider: Dwan Bolt Primary Cardiologist: [name]  Discharge Diagnoses    Principal Problem:   NSTEMI (non-ST elevated myocardial infarction) [facility] [address]) Active Problems:   Atherosclerotic heart disease of native coronary artery with angina pectoris ([place])   Accelerated hypertension with diastolic congestive heart failure, NYHA class 3 (HCC)   Diabetes mellitus (HCC)   HLD (hyperlipidemia)   Allergies Allergies  Allergen Reactions  . Fish Allergy Hives and Shortness Of Breath  . Ivp Dye [Iodinated Diagnostic Agents] Shortness Of Breath  . Latex Hives, Shortness Of Breath and Itching    REACTION: wheezing  . Shellfish-Derived Products Anaphylaxis and Hives  . Metformin Nausea And Vomiting  . Other     Patient reports allergy to perfumed detergents. Causes itching.    Diagnostic Studies/Procedures  Left Heart Cath and Cors/Grafts Angiography [id number]  LV end diastolic pressure is mildly elevated. In the setting of systemic hypertension  SVG-diagonal is moderate in size. Origin lesion, 55 %stenosed.  LM lesion, 55 %stenosed.  Ost LAD lesion, 100 %stenosed.  Prox LAD to Mid LAD lesion, 100 %stenosed. After D1.  Very small caliber Ost Ramus lesion, 70 %stenosed.  Ost Cx to Prox Cx lesion, 80 %stenosed. Prox Cx lesion, 70 %stenosed.  SVG-OM is large and anatomically normal. The flow in the graft is reversed. The bifurcating Cx OM is relatively normal.  Very tortuous L Subclavian artery - unable to perform L Radial Cath.    Patient has relatively stable coronary disease with maybe mild progression of the vein graft to the diagonal branch ostial disease that was maybe 40% previously. This does not appear to be enough flow limiting to cause resting symptoms, however most likely etiology is  accelerated hypertension, accelerated LVEDP and microvascular disease with a small ramus branch and other branches from the native system.  PLAN:   Admit to step down for ongoing blood pressure control. Will use IV nitroglycerin overnight. I will increase carvedilol to 6.25 twice a day and add amlodipine.  Continue aspirin, Plavix and statin  Can stop IV heparin   Transthoracic Echocardiography 11/02/16 Study Conclusions  - Left ventricle: The cavity size was normal. There was mild focal   basal hypertrophy of the septum. Systolic function was normal.   The estimated ejection fraction was in the range of 55% to 60%.   Wall motion was normal; there were no regional wall motion   abnormalities. Doppler parameters are consistent with abnormal   left ventricular relaxation (grade 1 diastolic dysfunction). - Aortic valve: There was trivial regurgitation. - Mitral valve: Calcified annulus. - Atrial septum: There was an atrial septal aneurysm. - Pulmonary arteries: Systolic pressure was mildly increased. PA   peak pressure: 35 mm Hg (S).  Impressions:  - Normal LV systolic function; grade 1 diastolic dysfunction; trace   AI; mild TR; mildly elevated pulmonary pressure.   _____________   History of Present Illness     Ms. Uher is a 82 year old female with a past medical history of CAD s/p CABG x 3 in 1998, PVD, HTN, HLD, and DM.   She was admitted on 11/01/16 with chest pain and was markedly hypertensive. She ruled in for NSTEMI, troponin was 0.37.   Hospital Course  Left heart cath showed relatively stable coronary disease with maybe mild progression of the vein graft to the  diagonal branch ostial disease that was maybe 40% previously. It did not appear to be enough flow limiting to cause resting symptoms, however most likely etiology is accelerated hypertension, elevated LVEDP and microvascular disease with a small ramus branch and other branches from the native  system.  Isosorbide was added back to her anti-anginal regimen at 30mg  daily. She had previously been on this and it was discontinued due to dizziness. Another option would be to add Ranexa.   Her Coreg was increased to 6.25mg  BID. Amlodipine 5mg  daily was added as well for better BP control. She had an Echo that showed normal EF and grade 1 diastolic dysfunction.   She was stable post procedure and seen today by [name] and deemed suitable for discharge. We will arrange close follow up to ensure that she is not having any unstable angina and tolerating her medication changes well.    _____________  Discharge Vitals Blood pressure (!) 144/79, pulse (!) 104, temperature 98 F (36.7 C), temperature source Oral, resp. rate (!) 21, height 5\' 3"  (1.6 m), weight 176 lb 5.9 oz (80 kg), SpO2 97 %.  Filed Weights   11/01/16 1232 11/01/16 1915 11/02/16 0226  Weight: 176 lb 5.9 oz (80 kg) 176 lb 5.9 oz (80 kg) 176 lb 5.9 oz (80 kg)    Labs & Radiologic Studies     CBC  Recent Labs  11/01/16 1010 11/02/16 0640  WBC 6.4 6.3  NEUTROABS 4.5  --   HGB 11.9* 12.9  HCT 38.8 41.2  MCV 85.8 84.3  PLT 253 123XX123   Basic Metabolic Panel  Recent Labs  11/01/16 1010 11/02/16 0640  NA 140 138  K 4.3 4.1  CL 105 102  CO2 28 25  GLUCOSE 124* 203*  BUN 13 11  CREATININE 0.78 0.99  CALCIUM 9.2 9.5   Cardiac Enzymes  Recent Labs  11/01/16 1010  TROPONINI 0.37*   Fasting Lipid Panel  Recent Labs  11/02/16 0640  CHOL 173  HDL 48  LDLCALC 114*  TRIG 55  CHOLHDL 3.6    Dg Chest 2 View  Result Date: 11/01/2016 CLINICAL DATA:  Chest pain beginning this morning. EXAM: CHEST  2 VIEW COMPARISON:  03/02/2016 FINDINGS: Prior CABG. Low lung volumes. Right base atelectasis. Left lung is clear. No effusions or acute bony abnormality. Heart is normal size. IMPRESSION: Low lung volumes. Right base atelectasis. No real change since prior study. Electronically Signed   By: Rolm Baptise M.D.    On: 11/01/2016 11:08    Disposition   Pt is being discharged home today in good condition.  Follow-up Plans & Appointments    Follow-up Information    Murray Hodgkins, NP Follow up on 11/11/2016.   Specialties:  Nurse Practitioner, Cardiology, Radiology Why:  at 8 am for hospital follow up.  Contact information: [address] [place] [address] [phone]          Discharge Instructions    Diet - low sodium heart healthy    Complete by:  As directed    Discharge instructions    Complete by:  As directed    Radial Site Care Refer to this sheet in the next few weeks. These instructions provide you with information on caring for yourself after your procedure. Your caregiver may also give you more specific instructions. Your treatment has been planned according to current medical practices, but problems sometimes occur. Call your caregiver if you have any problems or questions after your  procedure. HOME CARE INSTRUCTIONS You may shower the day after the procedure.Remove the bandage (dressing) and gently wash the site with plain soap and water.Gently pat the site dry.  Do not apply powder or lotion to the site.  Do not submerge the affected site in water for 3 to 5 days.  Inspect the site at least twice daily.  Do not flex or bend the affected arm for 24 hours.  No lifting over 5 pounds (2.3 kg) for 5 days after your procedure.  Do not drive home if you are discharged the same day of the procedure. Have someone else drive you.  You may drive 24 hours after the procedure unless otherwise instructed by your caregiver.  What to expect: Any bruising will usually fade within 1 to 2 weeks.  Blood that collects in the tissue (hematoma) may be painful to the touch. It should usually decrease in size and tenderness within 1 to 2 weeks.  SEEK IMMEDIATE MEDICAL CARE IF: You have unusual pain at the radial site.  You have redness, warmth, swelling, or pain at the  radial site.  You have drainage (other than a small amount of blood on the dressing).  You have chills.  You have a fever or persistent symptoms for more than 72 hours.  You have a fever and your symptoms suddenly get worse.  Your arm becomes pale, cool, tingly, or numb.  You have heavy bleeding from the site. Hold pressure on the site.   Increase activity slowly    Complete by:  As directed       Discharge Medications   Current Discharge Medication List    START taking these medications   Details  amLODipine (NORVASC) 5 MG tablet Take 1 tablet (5 mg total) by mouth daily. Qty: 30 tablet, Refills: 12      CONTINUE these medications which have CHANGED   Details  carvedilol (COREG) 6.25 MG tablet Take 1 tablet (6.25 mg total) by mouth 2 (two) times daily with a meal. Qty: 60 tablet, Refills: 12      CONTINUE these medications which have NOT CHANGED   Details  acetaminophen (TYLENOL) 500 MG tablet Take 1,000 mg by mouth every 6 (six) hours as needed.    clopidogrel (PLAVIX) 75 MG tablet Take 75 mg by mouth daily.    Coenzyme Q10 (CO Q 10 PO) Take 1 tablet by mouth daily.     ezetimibe (ZETIA) 10 MG tablet Take 10 mg by mouth daily.    glimepiride (AMARYL) 4 MG tablet Take 6 mg by mouth daily before breakfast.     hydrOXYzine (ATARAX/VISTARIL) 25 MG tablet TAKE 1 TO 2 TABLETS BY MOUTH AT BEDTIME FOR ITCHING Refills: 12    isosorbide mononitrate (IMDUR) 30 MG 24 hr tablet Take 30 mg by mouth daily.     Multiple Vitamins-Minerals (CENTRUM SILVER PO) Take 1 tablet by mouth daily.    pantoprazole (PROTONIX) 40 MG tablet TAKE 1 TABLET BY MOUTH DAILY Qty: 30 tablet, Refills: 5    rosuvastatin (CRESTOR) 20 MG tablet Take 20 mg by mouth every evening.     TRULICITY 1.5 0000000 SOPN Inject 1.5 mg into the skin once a week. Refills: 5    estradiol (ESTRACE) 1 MG tablet Take 1 mg by mouth daily.    furosemide (LASIX) 20 MG tablet Take 1 tablet by mouth daily. Take 1 tab  daily Refills: 0    saxagliptin HCl (ONGLYZA) 5 MG TABS tablet Take 5 mg by mouth  daily.         Aspirin prescribed at discharge?  Yes High Intensity Statin Prescribed? (Lipitor 40-80mg  or Crestor 20-40mg ): Yes Beta Blocker Prescribed? Yes For EF 40% or less, Was ACEI/ARB Prescribed? No: Ef normal  ADP Receptor Inhibitor Prescribed? (i.e. Plavix etc.-Includes Medically Managed Patients): Yes For EF <40%, Aldosterone Inhibitor Prescribed? No: EF normal  Was EF assessed during THIS hospitalization? Yes Was Cardiac Rehab II ordered? (Included Medically managed Patients): Yes   Outstanding Labs/Studies    Duration of Discharge Encounter   Greater than 30 minutes including physician time.  Signed, Arbutus Leas NP 11/02/2016, 12:46 PM    I saw evaluated patient this morning. She was actually feeling much better. No more severe chest pain, just a little bit of pressure that has been persistent all night long. Echocardiographic viewsof the wall motion modalities. No significant changes or cardiac cavitation yesterday. I suspect that her symptoms are probably related to accelerated hypertension and not a true non-STEMI. She did have some elevation in her cardiac enzymes which could be consistent with microvascular ischemia in the setting of hypertension.  We have added Imdur and increased her carvedilol dose.  As she was able to ambulate later on today without any difficulties at think she is stable for discharge we will see her in follow-up.   [name], M.D., M.S. Interventional Cardiologist   Pager # [phone] Phone # [phone] [address] [place], [place] [address]

## 2016-11-02 NOTE — Consult Note (Signed)
[facility] CM Primary Care Navigator  11/02/2016  [name] 06-05-1933 [id number]  Met with patient at the bedside to identify possible discharge needs. Patient reports terrible, excruciating chest pain had led to this admission. Patient endorses Dr. [name] with [facility] as the primary care provider.    Patient shared using [facility] to obtain medications without any problem at present. Patient manages her medicines at home straight out of the containers but decides to use "pill box" to organize it.   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 She drives prior to this admission. Patient's daughter Solmon Ice) or friends will provide transportation to her doctors' appointments. Patient's daughter, son Luvenia Redden) and daughter in-law can assist with her care when needed.    Patient expressed understanding to call primary care provider's office once discharged home, for a post discharge follow-up appointment within a week or sooner if needs arise.  Patient mentioned that Med Management from PCP's office is providing assistance with her diabetes management.  Patient denies any care coordination needs at this time. [facility] care management contact information provided if future needs arise.    For additional questions please contact:  Edwena Felty A. Diandre Merica, BSN, RN-BC [facility] PRIMARY CARE Navigator Cell: [phone]

## 2016-11-02 NOTE — Progress Notes (Signed)
CARDIAC REHAB PHASE I   PRE:  Rate/Rhythm:     BP: sitting 132/85    SaO2:   MODE:  Ambulation: 400 ft   POST:  Rate/Rhythm:     BP: sitting 192/83     SaO2:   Pt off monitor, preparing for d/c. Sts she has heaviness in chest, better than on admit. Pt able to walk long distance for her. Denied increase in her chest heaviness however she feels significantly tired. She rested x2 on return trip. BP elevated at 192/83. 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 Does not have much help at home. Encouraged her to rest and pay attention to sx. Gave MI book, diet, walking gl, NTG instructions, and CRPII. Will refer to [place] CRPII however she will prob not be able to do due to caring for her husband.  [place], ACSM 11/02/2016 3:06 PM

## 2016-11-02 NOTE — Progress Notes (Signed)
2D echocardiogram has been performed. 

## 2016-11-10 ENCOUNTER — Ambulatory Visit (HOSPITAL_COMMUNITY)
Admission: RE | Admit: 2016-11-10 | Discharge: 2016-11-10 | Disposition: A | Discharge status: Home / Self Care | Payer: Medicare Other | Source: Ambulatory Visit | Attending: Cardiovascular Disease | Admitting: Cardiovascular Disease

## 2016-11-10 DIAGNOSIS — I739 Peripheral vascular disease, unspecified: Secondary | ICD-10-CM | POA: Diagnosis not present

## 2016-11-11 ENCOUNTER — Ambulatory Visit: Payer: Medicare Other | Admitting: Nurse Practitioner

## 2016-11-11 ENCOUNTER — Encounter: Payer: Self-pay | Admitting: Nurse Practitioner

## 2016-11-11 ENCOUNTER — Ambulatory Visit (INDEPENDENT_AMBULATORY_CARE_PROVIDER_SITE_OTHER): Payer: Medicare Other | Admitting: Nurse Practitioner

## 2016-11-11 VITALS — BP 128/70 | HR 91 | Ht 63.0 in | Wt 164.0 lb

## 2016-11-11 DIAGNOSIS — I739 Peripheral vascular disease, unspecified: Secondary | ICD-10-CM

## 2016-11-11 DIAGNOSIS — I214 Non-ST elevation (NSTEMI) myocardial infarction: Secondary | ICD-10-CM | POA: Diagnosis not present

## 2016-11-11 DIAGNOSIS — I10 Essential (primary) hypertension: Secondary | ICD-10-CM | POA: Diagnosis not present

## 2016-11-11 DIAGNOSIS — I209 Angina pectoris, unspecified: Secondary | ICD-10-CM

## 2016-11-11 DIAGNOSIS — I25119 Atherosclerotic heart disease of native coronary artery with unspecified angina pectoris: Secondary | ICD-10-CM

## 2016-11-11 DIAGNOSIS — E782 Mixed hyperlipidemia: Secondary | ICD-10-CM | POA: Diagnosis not present

## 2016-11-11 MED ORDER — CARVEDILOL 12.5 MG PO TABS: 12.5000 mg | ORAL_TABLET | Freq: Two times a day (BID) | ORAL | 3 refills | Status: DC

## 2016-11-11 MED ORDER — NITROGLYCERIN 0.4 MG SL SUBL: 0.4000 mg | SUBLINGUAL_TABLET | SUBLINGUAL | 3 refills | Status: DC | PRN

## 2016-11-11 MED ORDER — ASPIRIN EC 81 MG PO TBEC: 81.0000 mg | DELAYED_RELEASE_TABLET | Freq: Every day | ORAL | 3 refills | Status: DC

## 2016-11-11 NOTE — Patient Instructions (Addendum)
Medication Instructions:  Your physician has recommended you make the following change in your medication:  1.  START Aspirin 81 mg taking 1 daily (over the counter) 2.  STOP the Amlodipine 3.  INCREASE the Coreg to 12.5 mg taking 1 tablet twice a day (2 of the 6.25 tablets twice a day is fine until you use them) 4.  START Nitroglycerin 0.4 s/l tablets USE AS DIRECTED ON THE BOTTLE  Labwork: None ordered  Testing/Procedures: None ordered  Follow-Up: Your physician recommends that you schedule a follow-up appointment in: 2-3 MONTHS WITH DR. HARDING   Any Other Special Instructions Will Be Listed Below (If Applicable).     If you need a refill on your cardiac medications before your next appointment, please call your pharmacy.

## 2016-11-11 NOTE — Progress Notes (Signed)
Office Visit    Patient Name: [name] Date of Encounter: 11/11/2016  Primary Care Provider:  Dwan Bolt, MD Primary Cardiologist:  Roni Bread, MD   Chief Complaint    82 y/o ? with a h/o CAD, HTN, HL, DMII, who presents for f/u after recent hospitalization for malignant HTN and nstemi.  Past Medical History    Past Medical History:  Diagnosis Date  . Atherosclerotic heart disease native coronary artery w/angina pectoris ([place]) 1992   a. 1992 s/p PTCA of LAD;  b. 1998 CABG x 3; c. 07/2001 Cath: Sev LM/LAD/LCX dzs, 3/3 patent grafts; d. 11/2013 Cath: stable graft anatomy; e. 10/2016 Cath: native 3VD, VG->OM nl, LIMA->LAD nl, VG->Diag 55.  . Bradycardia, mild briefly to the 40s, asymptomatic 05/16/2013  . Carotid arterial disease ([place])    a. 05/2014 Carotid U/S: No signif bilat ICA stenosis, >60% L ECA.  Marland Kitchen Chronic anemia   . Chronic diastolic CHF (congestive heart failure) ([place])    a. 10/2016 Echo: Ef 55-60%, no rwma, Gr1 DD, triv AI, PASP 7mmHg, atrial septal aneurysm.  . DM (diabetes mellitus), type 2 with peripheral vascular complications (H[facility])    On Oral medications.  . Essential hypertension   . GERD (gastroesophageal reflux disease)   . Hyperlipidemia with target LDL less than 70   . PAD (peripheral artery disease) ([place]) 05/2013; 09/2013   a. severe calcified POP-1 & TP Trunk Dz bilat;  b . 05/2013 Diamondback Rot Athrectomy (R POP) --> PTA R Pop, DES to R Peroneal;  c. 09/2013 PTA/Stenting of L Pop [name] - retrograde access);  d. 04/2014 ABI's: R/L: 1.1/1.1.  Marland Kitchen Peptic ulcer disease   . S/P CABG x 3 1998   LIMA-LAD, SVG-OM, SVG-D1  . Severe claudication ([place]) 05/2013   Referred for Peripheral Angio (Dr. Gwenlyn Found --> Dr. Brunetta Jeans in [place])   Past Surgical History:  Procedure Laterality Date  . ATHERECTOMY Right 05/15/2013   Procedure: ATHERECTOMY;  Surgeon: Lorretta Harp, MD;  Location: [facility] CATH LAB;  Service: Cardiovascular;  Laterality: Right;  popliteal    . BACK SURGERY    . CARDIAC CATHETERIZATION  07/17/01   2 v CAD with LM, circ, LAD, obtuse diag, mod stenosis of diag vein graft , mild stenosis of marg vein graft, nl EF, medical treatment  . CARDIAC CATHETERIZATION  11/2013   Widely patent LIMA-LAD & SVG-OM with mild progression of proximal stenosis ~40-50% in SVG-D1; Widely patent native RCA with known severe native LCA disease  . CARDIAC CATHETERIZATION N/A 11/01/2016   Procedure: Left Heart Cath and Cors/Grafts Angiography;  Surgeon: [name], MD;  Location: [place] CV LAB;  Service: Cardiovascular;  Laterality: N/A;  . [place]   PCI to LAD  . CORONARY ARTERY BYPASS GRAFT  1998   LIMA-LAD, SVG-diagonal (none roughly 50% stenosis), SVG-OM  . LEFT HEART CATHETERIZATION WITH CORONARY ANGIOGRAM N/A 11/27/2013   Procedure: LEFT HEART CATHETERIZATION WITH CORONARY ANGIOGRAM;  Surgeon: [name], MD;  Location[facility] C[facility] LAB;  Service: Cardiovascular;  Laterality: N/A;  . LOWER EXTREMITY ANGIOGRAM N/A 02/22/2013   Procedure: LOWER EXTREMITY ANGIOGRAM;  Surgeon: Lorretta Harp, MD;  Location: [facility] CATH LAB;  Service: Cardiovascular;  Laterality: N/A;  . LOWER EXTREMITY ANGIOGRAM N/A 07/20/2013   Procedure: LOWER EXTREMITY ANGIOGRAM;  Surgeon: Lorretta Harp, MD;  Location: [facility] CATH LAB;  Service: Cardiovascular;  Laterality: N/A;  . NM MYOVIEW LTD  04/03/2013; 5/26'/2016   Lexiscan:  a)  Apical perfusion defect with no ischemia. Consider prior infarct versus breast attenuation.;; b) 5/'16: LOW RISK, Nl EF ~55-65%, No Ischemia /Infarction  . PERCUTANEOUS STENT INTERVENTION Right 05/15/2013   Procedure: PERCUTANEOUS STENT INTERVENTION;  Surgeon: Lorretta Harp, MD;  Location: [facility] CATH LAB;  Service: Cardiovascular;  Laterality: Right;  tibioperoneal trunk  . Peroneal Artery Stent  05/15/13   Diamondback rot. atherectomy of high grade segmental popliteal stenosis then Chocolate balloonPTA; then angiosculpt PTA of the prox  peroneal with stent-Xpedition   . pv angio  07/20/13   high-grade calcified popliteal disease with one-vessel runoff via a small anterior tibial artery that has proximal disease as well, no intervention  . TRANSTHORACIC ECHOCARDIOGRAM  March 2012   Mild inferior-apical hypokinesis; EF 50-55%. Aortic sclerosis. PA pressure 30-40 mmHg.  Marland Kitchen TRANSTHORACIC ECHOCARDIOGRAM  02/2011; 04/2015   a) EF 50-55%, mild inf HK;; b)  Nl LV Size & Fxn EF 60-65%. mild LVH, Gr 1 DD.  . TUBAL LIGATION      Allergies  Allergies  Allergen Reactions  . Fish Allergy Hives and Shortness Of Breath  . Ivp Dye [Iodinated Diagnostic Agents] Shortness Of Breath and Anaphylaxis  . Latex Hives, Shortness Of Breath and Itching    REACTION: wheezing  . Shellfish-Derived Products Anaphylaxis and Hives  . Other     Patient reports allergy to perfumed detergents. Causes itching.  . Metformin Nausea And Vomiting    History of Present Illness    82 y/o ? with the above complex PMH including CAD s/p CABG x 3 in 1998, PVD, HTN, HL, and DM.  She was admitted to [facility] on 11/01/16 with marked HTN and chest pain.  She r/i for nstemi and underwent echo (nl EF) and cath (stable graft anatomy w/ known native 3VD).  Imdur and amlodipine were added while coreg was titrated to 6.25 mg bid.  She was subsequently d/c'd.  Since, she has done reasonably well.  She feels that she is recovering but continues to note mild DOE.  Since starting amlodipine, she has had some nausea and would like to come off of it.  She denies palpitations, dyspnea, pnd, orthopnea, v, dizziness, syncope, edema, weight gain, or early satiety.   Home Medications    Prior to Admission medications   Medication Sig Start Date End Date Taking? Authorizing Provider  acetaminophen (TYLENOL) 500 MG tablet Take 1,000 mg by mouth every 6 (six) hours as needed.   Yes Historical Provider, MD  carvedilol (COREG) 12.5 MG tablet Take 1 tablet (12.5 mg total) by mouth 2 (two) times  daily with a meal. 11/11/16  Yes Rogelia Mire, NP  clopidogrel (PLAVIX) 75 MG tablet Take 75 mg by mouth daily.   Yes Historical Provider, MD  Coenzyme Q10 (CO Q 10 PO) Take 1 tablet by mouth daily.    Yes Historical Provider, MD  estradiol (ESTRACE) 1 MG tablet Take 1 mg by mouth daily.   Yes Historical Provider, MD  ezetimibe (ZETIA) 10 MG tablet Take 10 mg by mouth daily.   Yes Historical Provider, MD  glimepiride (AMARYL) 4 MG tablet Take 6 mg by mouth daily before breakfast.    Yes Historical Provider, MD  isosorbide mononitrate (IMDUR) 30 MG 24 hr tablet Take 30 mg by mouth daily.    Yes Historical Provider, MD  Multiple Vitamins-Minerals (CENTRUM SILVER PO) Take 1 tablet by mouth daily.   Yes Historical Provider, MD  pantoprazole (PROTONIX) 40 MG tablet TAKE 1 TABLET BY  MOUTH DAILY 03/22/16  Yes [name], MD  rosuvastatin (CRESTOR) 20 MG tablet Take 20 mg by mouth every evening.    Yes Historical Provider, MD  TRULICITY 1.5 0000000 SOPN Inject 1.5 mg into the skin once a week. 09/13/16  Yes Historical Provider, MD  aspirin EC 81 MG tablet Take 1 tablet (81 mg total) by mouth daily. 11/11/16   Rogelia Mire, NP  nitroGLYCERIN (NITROSTAT) 0.4 MG SL tablet Place 1 tablet (0.4 mg total) under the tongue every 5 (five) minutes as needed for chest pain. 11/11/16 02/09/17  Rogelia Mire, NP    Review of Systems    +++ nausea.  +++ doe.  She denies chest pain, palpitations, pnd, orthopnea, v, dizziness, syncope, edema, weight gain, or early satiety.   All other systems reviewed and are otherwise negative except as noted above.  Physical Exam    VS:  BP 128/70   Pulse 91   Ht 5\' 3"  (1.6 m)   Wt 164 lb (74.4 kg)   SpO2 94%   BMI 29.05 kg/m  , BMI Body mass index is 29.05 kg/m. GEN: Well nourished, well developed, in no acute distress.  HEENT: normal.  Neck: Supple, no JVD, carotid bruits, or masses. Cardiac: RRR, no murmurs, rubs, or gallops. No clubbing,  cyanosis, edema.  Radials/DP/PT 2+ and equal bilaterally.  Respiratory:  Respirations regular and unlabored, clear to auscultation bilaterally. GI: Soft, nontender, nondistended, BS + x 4. MS: no deformity or atrophy. Skin: warm and dry, no rash. Neuro:  Strength and sensation are intact. Psych: Normal affect.  Accessory Clinical Findings    ECG - RSR, 80, anterolat TWI - no acute changes.  Assessment & Plan    1.  NSTEMI, subsequent episode of care/CAD:  S/p recent admission for c/p and malignant HTN.  Cath revealed stable anatomy w/ 3/3 patent grafts  medical mgmt.  No further c/p.  She remains on plavix, asa,  blocker, nitrate, and statin/zetia therapy.  2.  Malignant HTN:  BP better today @ 128/70.  She thinks amlodipine is causing nausea and wishes to stop it.  Therefor, I will titrate coreg to 12.5 mg bid (HR 91) and allow her to stop amlodipine.  She does have a cuff @ home and will continue to check he rpressure.  She otw remains on nitrate therapy.  3.  HL:  Cont statin/zetia.  LDL was 114 on 11/21. She had come off of her cholesterol meds prior to recent hospitalization.  LDL was 85 in 11/2013.  5.  Type II DM:  Managed by PCP.   6.  PAD:  No claudication.  Nl ABI's yesterday.  Cont asa/plavix/statin.  7. Dispo:  F/u with [name] in 2-3 months or sooner if necessary.   Murray Hodgkins, NP 11/11/2016, 4:33 PM

## 2016-11-15 ENCOUNTER — Telehealth: Payer: Self-pay | Admitting: Cardiovascular Disease

## 2016-11-15 DIAGNOSIS — I739 Peripheral vascular disease, unspecified: Secondary | ICD-10-CM

## 2016-11-15 NOTE — Telephone Encounter (Signed)
-----   Message from Lorretta Harp, MD sent at 11/13/2016  1:40 AM EST ----- No change from prior study. Repeat in 12 months.

## 2016-11-15 NOTE — Telephone Encounter (Signed)
Results given to pt. Pt verbalized understanding. Repeat LEA and ABI ordered for 1 year.

## 2016-11-17 DIAGNOSIS — Z23 Encounter for immunization: Secondary | ICD-10-CM | POA: Diagnosis not present

## 2016-11-17 DIAGNOSIS — E118 Type 2 diabetes mellitus with unspecified complications: Secondary | ICD-10-CM | POA: Diagnosis not present

## 2016-11-17 DIAGNOSIS — I213 ST elevation (STEMI) myocardial infarction of unspecified site: Secondary | ICD-10-CM | POA: Diagnosis not present

## 2016-11-17 DIAGNOSIS — I2510 Atherosclerotic heart disease of native coronary artery without angina pectoris: Secondary | ICD-10-CM | POA: Diagnosis not present

## 2016-11-17 DIAGNOSIS — N390 Urinary tract infection, site not specified: Secondary | ICD-10-CM | POA: Diagnosis not present

## 2016-11-18 ENCOUNTER — Telehealth: Payer: Self-pay | Admitting: Cardiology

## 2016-11-18 NOTE — Telephone Encounter (Signed)
Received [employer] FMLA Forms for Daughter Felecia late on 11/16/16.  Daughter called prior to received FMLA Forms via fax.  Explained process to her to get forms completed and signed.  12/7 LMOM for daughter to call office re forms.  Forms sent to [employer] @ [employer] to send letter to patient regarding FMLA forms. lp

## 2016-11-23 ENCOUNTER — Telehealth: Payer: Self-pay | Admitting: Cardiology

## 2016-11-23 NOTE — Telephone Encounter (Signed)
Received FMLA Forms back from [facility] for Dr Ellyn Hack to review, complete and sign.  Forms given to Earlie Server, RN for Dr Ellyn Hack. lp

## 2016-12-15 DIAGNOSIS — I10 Essential (primary) hypertension: Secondary | ICD-10-CM | POA: Diagnosis not present

## 2016-12-15 DIAGNOSIS — E118 Type 2 diabetes mellitus with unspecified complications: Secondary | ICD-10-CM | POA: Diagnosis not present

## 2016-12-15 DIAGNOSIS — N390 Urinary tract infection, site not specified: Secondary | ICD-10-CM | POA: Diagnosis not present

## 2016-12-15 DIAGNOSIS — E789 Disorder of lipoprotein metabolism, unspecified: Secondary | ICD-10-CM | POA: Diagnosis not present

## 2016-12-22 DIAGNOSIS — E118 Type 2 diabetes mellitus with unspecified complications: Secondary | ICD-10-CM | POA: Diagnosis not present

## 2016-12-22 DIAGNOSIS — I10 Essential (primary) hypertension: Secondary | ICD-10-CM | POA: Diagnosis not present

## 2016-12-22 DIAGNOSIS — N390 Urinary tract infection, site not specified: Secondary | ICD-10-CM | POA: Diagnosis not present

## 2017-01-07 ENCOUNTER — Ambulatory Visit (INDEPENDENT_AMBULATORY_CARE_PROVIDER_SITE_OTHER): Payer: Medicare Other | Admitting: Cardiology

## 2017-01-07 ENCOUNTER — Encounter: Payer: Self-pay | Admitting: Cardiology

## 2017-01-07 VITALS — BP 112/70 | HR 87 | Ht 63.0 in | Wt 162.2 lb

## 2017-01-07 DIAGNOSIS — R5383 Other fatigue: Secondary | ICD-10-CM | POA: Diagnosis not present

## 2017-01-07 DIAGNOSIS — I739 Peripheral vascular disease, unspecified: Secondary | ICD-10-CM

## 2017-01-07 DIAGNOSIS — Z79899 Other long term (current) drug therapy: Secondary | ICD-10-CM

## 2017-01-07 DIAGNOSIS — I5030 Unspecified diastolic (congestive) heart failure: Secondary | ICD-10-CM | POA: Diagnosis not present

## 2017-01-07 DIAGNOSIS — R0609 Other forms of dyspnea: Secondary | ICD-10-CM

## 2017-01-07 DIAGNOSIS — I214 Non-ST elevation (NSTEMI) myocardial infarction: Secondary | ICD-10-CM | POA: Diagnosis not present

## 2017-01-07 DIAGNOSIS — I25119 Atherosclerotic heart disease of native coronary artery with unspecified angina pectoris: Secondary | ICD-10-CM | POA: Diagnosis not present

## 2017-01-07 DIAGNOSIS — I209 Angina pectoris, unspecified: Secondary | ICD-10-CM | POA: Diagnosis not present

## 2017-01-07 DIAGNOSIS — R0600 Dyspnea, unspecified: Secondary | ICD-10-CM

## 2017-01-07 DIAGNOSIS — I110 Hypertensive heart disease with heart failure: Secondary | ICD-10-CM

## 2017-01-07 DIAGNOSIS — E785 Hyperlipidemia, unspecified: Secondary | ICD-10-CM | POA: Diagnosis not present

## 2017-01-07 NOTE — Patient Instructions (Addendum)
MEDICATION CHANGE; DECREASE  AND TAKE  3.125 MG  COREG ( CARVEDILOL)TWICE  A DAY FOR ONE WEEK IF YOU ARE FEELING LIKE YOU HAVE MORE ENERGY  THEN  INCREASE YOUR EVENING DOSE OF COREG ( CARVEDILOL) to 6.25 mg  ( either use the tablet you have or complete use all the 3.125 mg tablet <that will be 2 tablets of 3.125 mg>) If you become tired go back to 3.125 mg twice  aday    STOP ASPIRIN  LABS NEXT WEEK--GO TO [facility] ---ADDRESS [place] - do not eat or drink the morning of the test--LIPIDS, TSH,CBC, CMP     Your physician recommends that you schedule a follow-up appointment in 3 months with Angelica Ran PA   If you need a refill on your cardiac medications before your next appointment, please call your pharmacy.

## 2017-01-07 NOTE — Progress Notes (Signed)
PCP: Dwan Bolt, MD  Clinic Note: Chief Complaint  Patient presents with  . Follow-up    CAD, PAD, accelerated hypertension  . Shortness of Breath    randomly.  . Fatigue    going on 2 weeks    HPI: [name] is a 83 y.o. female with a PMH below who presents today for two-month follow-up of CAD, hypertension and chronic diastolic heart failure with hypertensive heart disease. I last saw her in clinic back in March 2017, but she has had an incidental fall of 2017.  MELYNA ATCHLEY was last seen on 11/11/2016 by Murray Hodgkins, NP for hospitalization follow-up after admission for malignant hypertension with positive troponins. She underwent cardiac catheterization demonstrating relatively stable disease with mild progression in disease in the vein graft to the diagonal branch. Did not appear to be flow-limiting. Blood pressure medications were increased and she was treated medically. -> She noted some nausea with amlodipine --> Mr. Sharolyn Douglas stopped amlodipine and increased carvedilol to 12.5 mg twice a day. He ensure that she was back on her statin/Zetia combination.  Recent Hospitalizations: None since November 2017 --> she was started on Imdur and amlodipine and Coreg dose was increased.  Studies Reviewed:   Cardiac Catheterization [id number]:  LV end diastolic pressure is mildly elevated. In the setting of systemic hypertension  SVG-diagonal is moderate in size. Origin lesion, 55 %stenosed.  LM lesion, 55 %stenosed.  Ost LAD lesion, 100 %stenosed. Prox LAD to Mid LAD lesion, 100 %stenosed. After D1. Patent LIMA-LAD.  Very small caliber Ost Ramus lesion, 70 %stenosed.  Ost Cx to Prox Cx lesion, 80 %stenosed. Prox Cx lesion, 70 %stenosed.  SVG-OM is large and anatomically normal. The flow in the graft is reversed. The bifurcating Cx OM is relatively normal.  Very tortuous L Subclavian artery - unable to perform L Radial Cath.   Patient has relatively stable  coronary disease with maybe mild progression of the vein graft to the diagonal branch ostial disease that was maybe 40% previously. This does not appear to be enough flow limiting to cause resting symptoms, however most likely etiology is accelerated hypertension, accelerated LVEDP and microvascular disease with a small ramus branch and other branches from the native system.   2-D echocardiogram 11/02/2016: EF 55-60%, GR 1 DD. Normal PA pressures.   Follow-up lower extremity arterial Dopplers:   Patent right SFA, popliteal and peroneal tendons. Patent left SFA and popliteal stent. 30-50% stenosis in the right common femoral and popliteal arteries, 50-74% stenosis in left profunda femoris artery. --> 1 year follow-up.  RABI - 1.2, LABI 1.1  Interval History: [name] presents today quite a bit out of sorts. She just feels tired and fatigued. Has no energy and no desire to do anything. She gets fatigued simply doing routine chores around the house that has never been an issue for her before. All this started after her last hospital stay. She just has no energy. She doesn't have any chest tightness or pressure with rest or exertion. No further heart failure symptoms of PND, orthopnea or edema - just her chronic mild exertional dyspnea. - Only one episode of chest pain requiring nitroglycerin last week, but this is not a common occurrence.   No palpitations, lightheadedness, dizziness, weakness or syncope/near syncope. No TIA/amaurosis fugax symptoms. No melena, hematochezia, hematuria, or epstaxis. No claudication.  ROS: A comprehensive was performed. Review of Systems  Constitutional: Positive for malaise/fatigue.  Cardiovascular:       Per history of  present illness  Gastrointestinal: Positive for heartburn (GERD).  Musculoskeletal: Joint pain: Arthritis pains - left knee is worse.  Neurological: Positive for dizziness (Occasionally positional).  Endo/Heme/Allergies: Does not bruise/bleed easily.    Psychiatric/Behavioral: Negative for depression and memory loss. The patient is not nervous/anxious and does not have insomnia.   All other systems reviewed and are negative.   Past Medical History:  Diagnosis Date  . Atherosclerotic heart disease native coronary artery w/angina pectoris ([place]) 1992   a. 1992 s/p PTCA of LAD;  b. 1998 CABG x 3; c. 07/2001 Cath: Sev LM/LAD/LCX dzs, 3/3 patent grafts; d. 11/2013 Cath: stable graft anatomy; e. 10/2016 Cath: native 3VD, VG->OM nl, LIMA->LAD nl, VG->Diag 55.  . Bradycardia, mild briefly to the 40s, asymptomatic 05/16/2013  . Carotid arterial disease ([place])    a. 05/2014 Carotid U/S: No signif bilat ICA stenosis, >60% L ECA.  Marland Kitchen Chronic anemia   . Chronic diastolic CHF (congestive heart failure) ([place])    a. 10/2016 Echo: Ef 55-60%, no rwma, Gr1 DD, triv AI, PASP 13mmHg, atrial septal aneurysm.  . DM (diabetes mellitus), type 2 with peripheral vascular complications (HCC)    On Oral medications.  . Essential hypertension   . GERD (gastroesophageal reflux disease)   . Hyperlipidemia with target LDL less than 70   . PAD (peripheral artery disease) ([place]) 05/2013; 09/2013   a. severe calcified POP-1 & TP Trunk Dz bilat;  b . 05/2013 Diamondback Rot Athrectomy (R POP) --> PTA R Pop, DES to R Peroneal;  c. 09/2013 PTA/Stenting of L Pop [name] - retrograde access);  d. 04/2014 ABI's: R/L: 1.1/1.1.  Marland Kitchen Peptic ulcer disease   . S/P CABG x 3 1998   LIMA-LAD, SVG-OM, SVG-D1  . Severe claudication ([place]) 05/2013   Referred for Peripheral Angio (Dr. Gwenlyn Found --> Dr. Brunetta Jeans in [place])    Past Surgical History:  Procedure Laterality Date  . ATHERECTOMY Right 05/15/2013   Procedure: ATHERECTOMY;  Surgeon: Lorretta Harp, MD;  Location: [facility] CATH LAB;  Service: Cardiovascular;  Laterality: Right;  popliteal  . BACK SURGERY    . CARDIAC CATHETERIZATION  07/17/01   2 v CAD with LM, circ, LAD, obtuse diag, mod stenosis of diag vein graft , mild stenosis of marg vein  graft, nl EF, medical treatment  . CARDIAC CATHETERIZATION  11/2013   Widely patent LIMA-LAD & SVG-OM with mild progression of proximal stenosis ~40-50% in SVG-D1; Widely patent native RCA with known severe native LCA disease  . CARDIAC CATHETERIZATION N/A 11/01/2016   Procedure: Left Heart Cath and Cors/Grafts Angiography;  Surgeon: [name], MD;  Location: [facility] CV LAB;  Service: Cardiovascular: Hemodynamics: High LVEDP!.  Cors/Grafts: LM 55%, o-p LAD 100% then after D1 -> LIMA-LAD patent, SVG-Diag ~55%. small RI wiht ~70% ostial. O-p Cx 80% - pCx 70% --> SVG-bifurcating OM patent. -- med Rx  . CORONARY ANGIOPLASTY  1992   PCI to LAD  . CORONARY ARTERY BYPASS GRAFT  1998   LIMA-LAD, SVG-diagonal (none roughly 50% stenosis), SVG-OM  . LEFT HEART CATHETERIZATION WITH CORONARY ANGIOGRAM N/A 11/27/2013   Procedure: LEFT HEART CATHETERIZATION WITH CORONARY ANGIOGRAM;  Surgeon: [name], MD;  Location: [facility] CATH LAB;  Service: Cardiovascular;  Laterality: N/A;  . LOW EXTREMITY DOPPLERS/ABI  10/2016   Patent right SFA, popliteal and peroneal tendons. Patent left SFA and popliteal stent. 30-50% stenosis in the right common femoral and popliteal arteries, 50-74% stenosis in left profunda femoris artery. --> 1  year follow-up.  RABI - 1.2, LABI 1.1  . LOWER EXTREMITY ANGIOGRAM N/A 02/22/2013   Procedure: LOWER EXTREMITY ANGIOGRAM;  Surgeon: Lorretta Harp, MD;  Location: [facility] CATH LAB;  Service: Cardiovascular;  Laterality: N/A;  . LOWER EXTREMITY ANGIOGRAM N/A 07/20/2013   Procedure: LOWER EXTREMITY ANGIOGRAM;  Surgeon: Lorretta Harp, MD;  Location: [facility] CATH LAB;  Service: Cardiovascular;  Laterality: N/A;  . NM MYOVIEW LTD  04/03/2013; 5/26'/2016   Lexiscan: a)  Apical perfusion defect with no ischemia. Consider prior infarct versus breast attenuation.;; b) 5/'16: LOW RISK, Nl EF ~55-65%, No Ischemia /Infarction  . PERCUTANEOUS STENT INTERVENTION Right 05/15/2013   Procedure: PERCUTANEOUS  STENT INTERVENTION;  Surgeon: Lorretta Harp, MD;  Location: [facility] CATH LAB;  Service: Cardiovascular;  Laterality: Right;  tibioperoneal trunk  . Peroneal Artery Stent  05/15/13   Diamondback rot. atherectomy of high grade segmental popliteal stenosis then Chocolate balloonPTA; then angiosculpt PTA of the prox peroneal with stent-Xpedition   . pv angio  07/20/13   high-grade calcified popliteal disease with one-vessel runoff via a small anterior tibial artery that has proximal disease as well, no intervention  . TRANSTHORACIC ECHOCARDIOGRAM  10/2016   EF 55-60%. Gr 1 DD. Normal PAP. Aortic Sclerosis.  . TRANSTHORACIC ECHOCARDIOGRAM  02/2011; 04/2015   a) EF 50-55%, mild inf HK;; b)  Nl LV Size & Fxn EF 60-65%. mild LVH, Gr 1 DD.  . TUBAL LIGATION      Current Meds  Medication Sig  . acetaminophen (TYLENOL) 500 MG tablet Take 1,000 mg by mouth every 6 (six) hours as needed.  . carvedilol (COREG) 12.5 MG tablet Take 1 tablet (12.5 mg total) by mouth 2 (two) times daily with a meal.  . clopidogrel (PLAVIX) 75 MG tablet Take 75 mg by mouth daily.  . Coenzyme Q10 (CO Q 10 PO) Take 1 tablet by mouth daily.   Marland Kitchen estradiol (ESTRACE) 1 MG tablet Take 1 mg by mouth daily.  Marland Kitchen ezetimibe (ZETIA) 10 MG tablet Take 10 mg by mouth daily.  Marland Kitchen glimepiride (AMARYL) 4 MG tablet Take 6 mg by mouth daily before breakfast.   . isosorbide mononitrate (IMDUR) 30 MG 24 hr tablet Take 30 mg by mouth daily.   . Multiple Vitamins-Minerals (CENTRUM SILVER PO) Take 1 tablet by mouth daily.  . nitroGLYCERIN (NITROSTAT) 0.4 MG SL tablet Place 1 tablet (0.4 mg total) under the tongue every 5 (five) minutes as needed for chest pain.  . pantoprazole (PROTONIX) 40 MG tablet TAKE 1 TABLET BY MOUTH DAILY  . rosuvastatin (CRESTOR) 20 MG tablet Take 20 mg by mouth every evening.   . TRULICITY 1.5 0000000 SOPN Inject 1.5 mg into the skin once a week.  . [DISCONTINUED] aspirin EC 81 MG tablet Take 1 tablet (81 mg total) by mouth daily.     Allergies  Allergen Reactions  . Fish Allergy Hives and Shortness Of Breath  . Ivp Dye [Iodinated Diagnostic Agents] Shortness Of Breath and Anaphylaxis  . Latex Hives, Shortness Of Breath and Itching    REACTION: wheezing  . Shellfish-Derived Products Anaphylaxis and Hives  . Other     Patient reports allergy to perfumed detergents. Causes itching.  . Metformin Nausea And Vomiting    Social History   Social History  . Marital status: Married    Spouse name: N/A  . Number of children: N/A  . Years of education: N/A   Social History Main Topics  . Smoking status: Never Smoker  .  Smokeless tobacco: Never Used  . Alcohol use No     Comment: rare glass of wine   . Drug use: No  . Sexual activity: Not Asked   Other Topics Concern  . None   Social History Narrative    She is a married mother of 45, grandmother of 38, great grandmother of 17.    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    She does not really get a lot of exercise,    She does not smoke or drink EtOH.    family history includes Hyperlipidemia in her father and mother; Hypertension in her father and mother.  Wt Readings from Last 3 Encounters:  01/07/17 73.6 kg (162 lb 3.2 oz)  11/11/16 74.4 kg (164 lb)  11/02/16 80 kg (176 lb 5.9 oz)    PHYSICAL EXAM BP 112/70   Pulse 87   Ht 5\' 3"  (1.6 m)   Wt 73.6 kg (162 lb 3.2 oz)   BMI 28.73 kg/m  General appearance: alert, cooperative, appears stated age, no distress and Well-nourished, well-groomed. She does appear to have lost some hair. She appears to be somewhat slow moving today and a little bit depressed. Neck: no adenopathy, no carotid bruit and no JVD  Lungs: clear to auscultation bilaterally, normal percussion bilaterally and non-labored  Heart: RRR, normal S1, S2, no murmur, click, rub or gallop; nondisplaced PMI  Abdomen: soft, non-tender; bowel sounds normal; no masses, no organomegaly; mild obesity  Extremities: extremities  normal, atraumatic, no cyanosis, or edema;There is some mild bruising on the left wrist.  Pulses: 2+ and symmetric upper extremities. Still has diminished pedal pulses, but stable.;  Neurologic: Mental status: Alert, oriented, thought content appropriate;     Adult ECG Report  Rate: 87 ;  Rhythm: normal sinus rhythm and Otherwise normal axis, intervals and durations;   Narrative Interpretation: Normal, stable EKG   Other studies Reviewed: Additional studies/ records that were reviewed today include:  Recent Labs:   Lab Results  Component Value Date   CHOL 173 11/02/2016   HDL 48 11/02/2016   LDLCALC 114 (H) 11/02/2016   TRIG 55 11/02/2016   CHOLHDL 3.6 11/02/2016     ASSESSMENT / PLAN: Problem List Items Addressed This Visit    Atherosclerotic heart disease of native coronary artery with angina pectoris ([place]) (Chronic)    Results reviewed. Despite having positive troponin, I suspect that her symptoms were most microvascular in nature due to accelerated hypertension. Thankfully her blood pressure is back in normal range significant back back down on her control. Would probably use something other than beta blocker if we needed more blood pressure control. For now she is on carvedilol, Imdur, statin as well as aspirin and Plavix. -- Okay to stop aspirin       Relevant Orders   EKG 12-Lead   Comprehensive metabolic panel   Lipid panel   TSH   CBC   Dyslipidemia, goal LDL below 70 (Chronic)    Her lipids are being monitored by her endocrinologist. Most recent check does not show as well controlled labs. She is on CoQ10. On Zetia, but no longer on PCSK9 inhibitor.       Relevant Orders   EKG 12-Lead   Comprehensive metabolic panel   Lipid panel   TSH   CBC   Accelerated hypertension with diastolic congestive heart failure, NYHA class 3 (HCC) (Chronic)    Thankfully, her blood pressure looks much better, more stable. I'm  not really sure what led to her episode when she  went to the hospital. She is no longer on ARB and is back on low-dose beta blocker. Actually got him requiring diuretic at this time. We'll continue to monitor.      Relevant Orders   EKG 12-Lead   Comprehensive metabolic panel   Lipid panel   TSH   CBC   Angina, class I (HCC) (Chronic)    I suspect that her symptoms are microvascular nature based on her Results. Certainly she would be symptomatically for blood pressure were to go high as was the case in November. Continue current dose of Imdur, beta blocker      PAD,severe calcified pop and tibial peroneal trunk disease bilateraly (Chronic)    She's had extensive peripheral vascular work done. Follow-up Dopplers reviewed. Stable claudication symptoms. Monitored by Dr. Gwenlyn Found      DOE (dyspnea on exertion) (Chronic)    Pretty stable actually. She is more bothered by fatigue. Unfortunately we really can't ramp up her beta blocker anymore because of this fatigue. She seems euvolemic and does not need any diuretic at this time.      NSTEMI (non-ST elevated myocardial infarction) ([place])    Positive troponins in the setting of accelerated hypertension probably more consistent with type II MI and type I based on the fact that there was no acute culprit lesion noted on cath. Thankfully, her EF is preserved without significant wall motion abnormality.      Fatigue due to treatment - Primary    It would appear that her fatigue symptoms have begun since titrating up her aunt hypertensive agents, most notably her beta blocker. She actually never did increase from 6.25-12.5 twice a day of carvedilol.  LAN: Reduced back to 3.125 mg twice a day carvedilol. After a week, and she is feeling better I would like for her to try to take 6.25 for the p.m. dose and monitor for signs of fatigue.  For completion sake, we will check CBC, TSH along with her regular screening labs including CMP and lipid panel.      Relevant Orders   EKG 12-Lead    Comprehensive metabolic panel   Lipid panel   TSH   CBC    Other Visit Diagnoses    Medication management       Relevant Orders   EKG 12-Lead   Comprehensive metabolic panel   Lipid panel   TSH   CBC      Current medicines are reviewed at length with the patient today. (+/- concerns) Complains of fatigue. Thinks is related to medications. The following changes have been made: See below. His beta blocker dose  Patient Instructions  MEDICATION CHANGE; DECREASE  AND TAKE  3.125 MG  COREG ( CARVEDILOL)TWICE  A DAY FOR ONE WEEK IF YOU ARE FEELING LIKE YOU HAVE MORE ENERGY  THEN  INCREASE YOUR EVENING DOSE OF COREG ( CARVEDILOL) to 6.25 mg  ( either use the tablet you have or complete use all the 3.125 mg tablet <that will be 2 tablets of 3.125 mg>) If you become tired go back to 3.125 mg twice  aday    STOP ASPIRIN  LABS NEXT WEEK--GO TO [facility] ---ADDRESS [place] - do not eat or drink the morning of the test--LIPIDS, TSH,CBC, CMP     Your physician recommends that you schedule a follow-up appointment in 3 months with Angelica Ran PA   If you  need a refill on your cardiac medications before your next appointment, please call your pharmacy.    Studies Ordered:   Orders Placed This Encounter  Procedures  . Comprehensive metabolic panel  . Lipid panel  . TSH  . CBC  . EKG 12-Lead      [name], M.D., M.S. Interventional Cardiologist   Pager # [phone] Phone # [phone] [address] [place], [place] [address]

## 2017-01-09 ENCOUNTER — Encounter: Payer: Self-pay | Admitting: Cardiology

## 2017-01-09 DIAGNOSIS — R5383 Other fatigue: Secondary | ICD-10-CM | POA: Insufficient documentation

## 2017-01-09 NOTE — Assessment & Plan Note (Signed)
Pretty stable actually. She is more bothered by fatigue. Unfortunately we really can't ramp up her beta blocker anymore because of this fatigue. She seems euvolemic and does not need any diuretic at this time.

## 2017-01-09 NOTE — Assessment & Plan Note (Signed)
It would appear that her fatigue symptoms have begun since titrating up her aunt hypertensive agents, most notably her beta blocker. She actually never did increase from 6.25-12.5 twice a day of carvedilol.  LAN: Reduced back to 3.125 mg twice a day carvedilol. After a week, and she is feeling better I would like for her to try to take 6.25 for the p.m. dose and monitor for signs of fatigue.  For completion sake, we will check CBC, TSH along with her regular screening labs including CMP and lipid panel.

## 2017-01-09 NOTE — Assessment & Plan Note (Signed)
Positive troponins in the setting of accelerated hypertension probably more consistent with type II MI and type I based on the fact that there was no acute culprit lesion noted on cath. Thankfully, her EF is preserved without significant wall motion abnormality.

## 2017-01-09 NOTE — Assessment & Plan Note (Signed)
I suspect that her symptoms are microvascular nature based on her Results. Certainly she would be symptomatically for blood pressure were to go high as was the case in November. Continue current dose of Imdur, beta blocker

## 2017-01-09 NOTE — Assessment & Plan Note (Signed)
She's had extensive peripheral vascular work done. Follow-up Dopplers reviewed. Stable claudication symptoms. Monitored by Dr. Gwenlyn Found

## 2017-01-09 NOTE — Assessment & Plan Note (Signed)
Thankfully, her blood pressure looks much better, more stable. I'm not really sure what led to her episode when she went to the hospital. She is no longer on ARB and is back on low-dose beta blocker. Actually got him requiring diuretic at this time. We'll continue to monitor.

## 2017-01-09 NOTE — Assessment & Plan Note (Signed)
Results reviewed. Despite having positive troponin, I suspect that her symptoms were most microvascular in nature due to accelerated hypertension. Thankfully her blood pressure is back in normal range significant back back down on her control. Would probably use something other than beta blocker if we needed more blood pressure control. For now she is on carvedilol, Imdur, statin as well as aspirin and Plavix. -- Okay to stop aspirin

## 2017-01-09 NOTE — Assessment & Plan Note (Signed)
Her lipids are being monitored by her endocrinologist. Most recent check does not show as well controlled labs. She is on CoQ10. On Zetia, but no longer on PCSK9 inhibitor.

## 2017-01-14 DIAGNOSIS — E785 Hyperlipidemia, unspecified: Secondary | ICD-10-CM | POA: Diagnosis not present

## 2017-01-14 DIAGNOSIS — I5030 Unspecified diastolic (congestive) heart failure: Secondary | ICD-10-CM | POA: Diagnosis not present

## 2017-01-14 DIAGNOSIS — I25119 Atherosclerotic heart disease of native coronary artery with unspecified angina pectoris: Secondary | ICD-10-CM | POA: Diagnosis not present

## 2017-01-14 DIAGNOSIS — I110 Hypertensive heart disease with heart failure: Secondary | ICD-10-CM | POA: Diagnosis not present

## 2017-01-14 DIAGNOSIS — R5383 Other fatigue: Secondary | ICD-10-CM | POA: Diagnosis not present

## 2017-01-14 DIAGNOSIS — Z79899 Other long term (current) drug therapy: Secondary | ICD-10-CM | POA: Diagnosis not present

## 2017-01-15 LAB — TSH: TSH: 1.92 u[IU]/mL (ref 0.450–4.500)

## 2017-01-15 LAB — CBC
Hematocrit: 37.3 % (ref 34.0–46.6)
Hemoglobin: 11.7 g/dL (ref 11.1–15.9)
MCH: 27.3 pg (ref 26.6–33.0)
MCHC: 31.4 g/dL — ABNORMAL LOW (ref 31.5–35.7)
MCV: 87 fL (ref 79–97)
PLATELETS: 314 10*3/uL (ref 150–379)
RBC: 4.28 x10E6/uL (ref 3.77–5.28)
RDW: 14.5 % (ref 12.3–15.4)
WBC: 5.1 10*3/uL (ref 3.4–10.8)

## 2017-01-15 LAB — COMPREHENSIVE METABOLIC PANEL
ALBUMIN: 4.2 g/dL (ref 3.5–4.7)
ALT: 13 IU/L (ref 0–32)
AST: 19 IU/L (ref 0–40)
Albumin/Globulin Ratio: 1.4 (ref 1.2–2.2)
Alkaline Phosphatase: 48 IU/L (ref 39–117)
BUN / CREAT RATIO: 16 (ref 12–28)
BUN: 15 mg/dL (ref 8–27)
Bilirubin Total: 0.5 mg/dL (ref 0.0–1.2)
CALCIUM: 9.3 mg/dL (ref 8.7–10.3)
CO2: 27 mmol/L (ref 18–29)
CREATININE: 0.91 mg/dL (ref 0.57–1.00)
Chloride: 96 mmol/L (ref 96–106)
GFR calc Af Amer: 68 mL/min/{1.73_m2} (ref >59)
GFR, EST NON AFRICAN AMERICAN: 59 mL/min/{1.73_m2} — AB (ref >59)
GLOBULIN, TOTAL: 2.9 g/dL (ref 1.5–4.5)
Glucose: 91 mg/dL (ref 65–99)
Potassium: 4.4 mmol/L (ref 3.5–5.2)
SODIUM: 139 mmol/L (ref 134–144)
Total Protein: 7.1 g/dL (ref 6.0–8.5)

## 2017-01-15 LAB — LIPID PANEL
CHOL/HDL RATIO: 4.1 ratio (ref 0.0–4.4)
Cholesterol, Total: 207 mg/dL — ABNORMAL HIGH (ref 100–199)
HDL: 50 mg/dL (ref >39)
LDL CALC: 140 mg/dL — AB (ref 0–99)
TRIGLYCERIDES: 85 mg/dL (ref 0–149)
VLDL CHOLESTEROL CAL: 17 mg/dL (ref 5–40)

## 2017-01-26 ENCOUNTER — Ambulatory Visit (INDEPENDENT_AMBULATORY_CARE_PROVIDER_SITE_OTHER): Payer: Medicare Other | Admitting: Podiatry

## 2017-01-26 ENCOUNTER — Encounter: Payer: Self-pay | Admitting: Podiatry

## 2017-01-26 VITALS — BP 190/90 | HR 74 | Resp 16

## 2017-01-26 DIAGNOSIS — L600 Ingrowing nail: Secondary | ICD-10-CM | POA: Diagnosis not present

## 2017-01-26 DIAGNOSIS — E1140 Type 2 diabetes mellitus with diabetic neuropathy, unspecified: Secondary | ICD-10-CM | POA: Diagnosis not present

## 2017-01-26 DIAGNOSIS — E1149 Type 2 diabetes mellitus with other diabetic neurological complication: Secondary | ICD-10-CM

## 2017-01-26 DIAGNOSIS — I209 Angina pectoris, unspecified: Secondary | ICD-10-CM

## 2017-01-26 MED ORDER — GABAPENTIN 300 MG PO CAPS: 300.0000 mg | ORAL_CAPSULE | Freq: Three times a day (TID) | ORAL | 3 refills | Status: DC

## 2017-01-26 NOTE — Progress Notes (Signed)
Subjective:     Patient ID: [name], female   DOB: Jan 09, 1933, 83 y.o.   MRN: [id number]  HPI patient presents with caregiver concerned about some splinting of the right big toenail and also burning in her feet with long-term history of diabetes   Review of Systems     Objective:   Physical Exam Neurovascular status intact no other changes noted and patient is noted to have a small amount of splinting on the medial side right hallux localized in nature and idiopathic burning of both feet    Assessment:     Ingrown nail with patient also noted to have probable neuropathy    Plan:     At this point I went ahead and discussed trying gabapentin just at nighttime to see if we can reduce some of the burning she's getting and I debrided the nail on the medial side and also discussed high blood pressure and that she should get this checked

## 2017-02-02 ENCOUNTER — Telehealth: Payer: Self-pay | Admitting: *Deleted

## 2017-02-02 NOTE — Telephone Encounter (Signed)
Left message to call back lab results.

## 2017-02-02 NOTE — Telephone Encounter (Signed)
-----   Message from [name], MD sent at 01/20/2017  7:13 PM EST ----- Essentially normal kidney function, liver function as well as normal chemistry panel.  Lipid panel does not look good. Over the last 3 years, the LDL has significantly gotten worse. Total cholesterol is up to 207 and LDL is up 140. Plan: As increase Crestor to 40 mg a day (for first month start every other day with 20 mg on the other day. Then increase to 40 mg daily if tolerated. Needs to have lipid only labs rechecked in 3 months after starting.   [name], MD  Please forward to Dr. [name]

## 2017-03-01 ENCOUNTER — Other Ambulatory Visit: Payer: Self-pay | Admitting: Cardiology

## 2017-03-01 ENCOUNTER — Encounter: Payer: Self-pay | Admitting: Cardiology

## 2017-03-01 DIAGNOSIS — E785 Hyperlipidemia, unspecified: Secondary | ICD-10-CM

## 2017-03-01 MED ORDER — ROSUVASTATIN CALCIUM 40 MG PO TABS: ORAL_TABLET | ORAL | 3 refills | Status: DC

## 2017-03-01 NOTE — Telephone Encounter (Signed)
Rx(s) sent to pharmacy electronically.  

## 2017-03-09 DIAGNOSIS — M25569 Pain in unspecified knee: Secondary | ICD-10-CM | POA: Diagnosis not present

## 2017-03-09 DIAGNOSIS — M1990 Unspecified osteoarthritis, unspecified site: Secondary | ICD-10-CM | POA: Diagnosis not present

## 2017-03-09 DIAGNOSIS — I2510 Atherosclerotic heart disease of native coronary artery without angina pectoris: Secondary | ICD-10-CM | POA: Diagnosis not present

## 2017-03-09 DIAGNOSIS — M25561 Pain in right knee: Secondary | ICD-10-CM | POA: Diagnosis not present

## 2017-03-09 DIAGNOSIS — M1712 Unilateral primary osteoarthritis, left knee: Secondary | ICD-10-CM | POA: Diagnosis not present

## 2017-03-28 ENCOUNTER — Emergency Department (HOSPITAL_COMMUNITY): Payer: Medicare Other

## 2017-03-28 ENCOUNTER — Encounter (HOSPITAL_COMMUNITY): Payer: Self-pay | Admitting: Emergency Medicine

## 2017-03-28 ENCOUNTER — Emergency Department (HOSPITAL_COMMUNITY)
Admission: EM | Admit: 2017-03-28 | Discharge: 2017-03-28 | Disposition: A | Discharge status: Home / Self Care | Payer: Medicare Other | Attending: Emergency Medicine | Admitting: Emergency Medicine

## 2017-03-28 DIAGNOSIS — Y92009 Unspecified place in unspecified non-institutional (private) residence as the place of occurrence of the external cause: Secondary | ICD-10-CM | POA: Diagnosis not present

## 2017-03-28 DIAGNOSIS — Y999 Unspecified external cause status: Secondary | ICD-10-CM | POA: Diagnosis not present

## 2017-03-28 DIAGNOSIS — M25561 Pain in right knee: Secondary | ICD-10-CM | POA: Diagnosis not present

## 2017-03-28 DIAGNOSIS — I252 Old myocardial infarction: Secondary | ICD-10-CM | POA: Insufficient documentation

## 2017-03-28 DIAGNOSIS — Z91040 Latex allergy status: Secondary | ICD-10-CM | POA: Diagnosis not present

## 2017-03-28 DIAGNOSIS — I110 Hypertensive heart disease with heart failure: Secondary | ICD-10-CM | POA: Insufficient documentation

## 2017-03-28 DIAGNOSIS — S8992XA Unspecified injury of left lower leg, initial encounter: Secondary | ICD-10-CM | POA: Diagnosis not present

## 2017-03-28 DIAGNOSIS — I10 Essential (primary) hypertension: Secondary | ICD-10-CM | POA: Diagnosis not present

## 2017-03-28 DIAGNOSIS — I5032 Chronic diastolic (congestive) heart failure: Secondary | ICD-10-CM | POA: Diagnosis not present

## 2017-03-28 DIAGNOSIS — Z79899 Other long term (current) drug therapy: Secondary | ICD-10-CM | POA: Insufficient documentation

## 2017-03-28 DIAGNOSIS — M25562 Pain in left knee: Secondary | ICD-10-CM | POA: Diagnosis not present

## 2017-03-28 DIAGNOSIS — S8001XA Contusion of right knee, initial encounter: Secondary | ICD-10-CM | POA: Diagnosis not present

## 2017-03-28 DIAGNOSIS — E1151 Type 2 diabetes mellitus with diabetic peripheral angiopathy without gangrene: Secondary | ICD-10-CM | POA: Insufficient documentation

## 2017-03-28 DIAGNOSIS — Z7984 Long term (current) use of oral hypoglycemic drugs: Secondary | ICD-10-CM | POA: Insufficient documentation

## 2017-03-28 DIAGNOSIS — Z951 Presence of aortocoronary bypass graft: Secondary | ICD-10-CM | POA: Insufficient documentation

## 2017-03-28 DIAGNOSIS — S81011A Laceration without foreign body, right knee, initial encounter: Secondary | ICD-10-CM | POA: Insufficient documentation

## 2017-03-28 DIAGNOSIS — I2510 Atherosclerotic heart disease of native coronary artery without angina pectoris: Secondary | ICD-10-CM | POA: Diagnosis not present

## 2017-03-28 DIAGNOSIS — W1839XA Other fall on same level, initial encounter: Secondary | ICD-10-CM | POA: Insufficient documentation

## 2017-03-28 DIAGNOSIS — Y9389 Activity, other specified: Secondary | ICD-10-CM | POA: Diagnosis not present

## 2017-03-28 DIAGNOSIS — S8991XA Unspecified injury of right lower leg, initial encounter: Secondary | ICD-10-CM | POA: Diagnosis not present

## 2017-03-28 DIAGNOSIS — S8000XA Contusion of unspecified knee, initial encounter: Secondary | ICD-10-CM | POA: Insufficient documentation

## 2017-03-28 DIAGNOSIS — Z8673 Personal history of transient ischemic attack (TIA), and cerebral infarction without residual deficits: Secondary | ICD-10-CM | POA: Insufficient documentation

## 2017-03-28 DIAGNOSIS — W19XXXA Unspecified fall, initial encounter: Secondary | ICD-10-CM

## 2017-03-28 LAB — CBC WITH DIFFERENTIAL/PLATELET
BASOS ABS: 0 10*3/uL (ref 0.0–0.1)
BASOS PCT: 0 %
Eosinophils Absolute: 0.3 10*3/uL (ref 0.0–0.7)
Eosinophils Relative: 4 %
HEMATOCRIT: 34.6 % — AB (ref 36.0–46.0)
HEMOGLOBIN: 10.7 g/dL — AB (ref 12.0–15.0)
LYMPHS PCT: 30 %
Lymphs Abs: 1.8 10*3/uL (ref 0.7–4.0)
MCH: 26.1 pg (ref 26.0–34.0)
MCHC: 30.9 g/dL (ref 30.0–36.0)
MCV: 84.4 fL (ref 78.0–100.0)
MONOS PCT: 7 %
Monocytes Absolute: 0.4 10*3/uL (ref 0.1–1.0)
NEUTROS ABS: 3.5 10*3/uL (ref 1.7–7.7)
NEUTROS PCT: 59 %
Platelets: 234 10*3/uL (ref 150–400)
RBC: 4.1 MIL/uL (ref 3.87–5.11)
RDW: 13.6 % (ref 11.5–15.5)
WBC: 5.9 10*3/uL (ref 4.0–10.5)

## 2017-03-28 LAB — COMPREHENSIVE METABOLIC PANEL
ALBUMIN: 3.6 g/dL (ref 3.5–5.0)
ALK PHOS: 45 U/L (ref 38–126)
ALT: 18 U/L (ref 14–54)
AST: 23 U/L (ref 15–41)
Anion gap: 7 (ref 5–15)
BILIRUBIN TOTAL: 0.6 mg/dL (ref 0.3–1.2)
BUN: 17 mg/dL (ref 6–20)
CO2: 26 mmol/L (ref 22–32)
Calcium: 8.9 mg/dL (ref 8.9–10.3)
Chloride: 107 mmol/L (ref 101–111)
Creatinine, Ser: 1.08 mg/dL — ABNORMAL HIGH (ref 0.44–1.00)
GFR calc Af Amer: 54 mL/min — ABNORMAL LOW (ref >60)
GFR calc non Af Amer: 47 mL/min — ABNORMAL LOW (ref >60)
GLUCOSE: 199 mg/dL — AB (ref 65–99)
Potassium: 3.9 mmol/L (ref 3.5–5.1)
Sodium: 140 mmol/L (ref 135–145)
TOTAL PROTEIN: 6.6 g/dL (ref 6.5–8.1)

## 2017-03-28 LAB — TROPONIN I

## 2017-03-28 LAB — CBG MONITORING, ED: GLUCOSE-CAPILLARY: 168 mg/dL — AB (ref 65–99)

## 2017-03-28 MED ORDER — SODIUM CHLORIDE 0.9 % IV SOLN
INTRAVENOUS | Status: DC
  Administered 2017-03-28: 16:00:00 via INTRAVENOUS

## 2017-03-28 MED ORDER — ACETAMINOPHEN 500 MG PO TABS
1000.0000 mg | ORAL_TABLET | Freq: Once | ORAL | Status: AC
  Administered 2017-03-28: 1000 mg via ORAL
  Filled 2017-03-28: qty 2

## 2017-03-28 NOTE — ED Notes (Signed)
Pt ambulated in room and was able to do so with a steady gait, MD little at bedside to explain discharge explanation.

## 2017-03-28 NOTE — ED Notes (Signed)
Family at bedside. 

## 2017-03-28 NOTE — ED Triage Notes (Signed)
Pt. Stated, I fell and hurt both knees but I have not eaten since breakfast I feel like Im going to pass out.
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
## 2017-03-28 NOTE — ED Notes (Signed)
Pt brought back to treatment room and placed on monitor. pts initial blood pressure 146/86.

## 2017-04-05 DIAGNOSIS — I10 Essential (primary) hypertension: Secondary | ICD-10-CM | POA: Diagnosis not present

## 2017-04-05 DIAGNOSIS — E118 Type 2 diabetes mellitus with unspecified complications: Secondary | ICD-10-CM | POA: Diagnosis not present

## 2017-04-05 DIAGNOSIS — Z09 Encounter for follow-up examination after completed treatment for conditions other than malignant neoplasm: Secondary | ICD-10-CM | POA: Diagnosis not present

## 2017-04-05 DIAGNOSIS — E789 Disorder of lipoprotein metabolism, unspecified: Secondary | ICD-10-CM | POA: Diagnosis not present

## 2017-04-05 DIAGNOSIS — L0390 Cellulitis, unspecified: Secondary | ICD-10-CM | POA: Diagnosis not present

## 2017-04-07 DIAGNOSIS — E118 Type 2 diabetes mellitus with unspecified complications: Secondary | ICD-10-CM | POA: Diagnosis not present

## 2017-04-07 DIAGNOSIS — L0390 Cellulitis, unspecified: Secondary | ICD-10-CM | POA: Diagnosis not present

## 2017-04-08 DIAGNOSIS — L0390 Cellulitis, unspecified: Secondary | ICD-10-CM | POA: Diagnosis not present

## 2017-04-12 DIAGNOSIS — L0390 Cellulitis, unspecified: Secondary | ICD-10-CM | POA: Diagnosis not present

## 2017-04-19 DIAGNOSIS — E118 Type 2 diabetes mellitus with unspecified complications: Secondary | ICD-10-CM | POA: Diagnosis not present

## 2017-04-26 DIAGNOSIS — N390 Urinary tract infection, site not specified: Secondary | ICD-10-CM | POA: Diagnosis not present

## 2017-04-26 DIAGNOSIS — L0390 Cellulitis, unspecified: Secondary | ICD-10-CM | POA: Diagnosis not present

## 2017-05-16 ENCOUNTER — Encounter: Payer: Self-pay | Admitting: Cardiology

## 2017-05-16 ENCOUNTER — Ambulatory Visit (INDEPENDENT_AMBULATORY_CARE_PROVIDER_SITE_OTHER): Payer: Medicare Other | Admitting: Cardiology

## 2017-05-16 VITALS — BP 96/58 | HR 74 | Ht 63.0 in | Wt 163.4 lb

## 2017-05-16 DIAGNOSIS — I110 Hypertensive heart disease with heart failure: Secondary | ICD-10-CM

## 2017-05-16 DIAGNOSIS — I214 Non-ST elevation (NSTEMI) myocardial infarction: Secondary | ICD-10-CM

## 2017-05-16 DIAGNOSIS — R5383 Other fatigue: Secondary | ICD-10-CM

## 2017-05-16 DIAGNOSIS — I5030 Unspecified diastolic (congestive) heart failure: Secondary | ICD-10-CM | POA: Diagnosis not present

## 2017-05-16 DIAGNOSIS — E785 Hyperlipidemia, unspecified: Secondary | ICD-10-CM | POA: Diagnosis not present

## 2017-05-16 DIAGNOSIS — I209 Angina pectoris, unspecified: Secondary | ICD-10-CM | POA: Diagnosis not present

## 2017-05-16 DIAGNOSIS — I25119 Atherosclerotic heart disease of native coronary artery with unspecified angina pectoris: Secondary | ICD-10-CM | POA: Diagnosis not present

## 2017-05-16 MED ORDER — CARVEDILOL 3.125 MG PO TABS: 3.1250 mg | ORAL_TABLET | Freq: Two times a day (BID) | ORAL | 3 refills | Status: DC
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
## 2017-05-16 NOTE — Patient Instructions (Addendum)
MEDICATION DECREASE CARVEDILOL 3.125 MG  TWICE A DAY     IF YOU HAVE TO TAKE UP TO 2 SUBLINGUAL NITROGLYCERIN TABLETS- IN ADDITION TAKE 1 TABLET OF CARVEDILOL 3.125 MG AS WELL.    LABS   LIPID  HEPATIC DO NOT EAT OR DRINK THE MORNING OF THE TEST MAY COME TO OFFICE TO HAVE LABS DONE BETWEEN 8 AM -4:30 PM   Your physician recommends that you schedule a follow-up appointment in [place] physician wants you to follow-up in [place]. You will receive a reminder letter in the mail two months in advance. If you don't receive a letter, please call our office to schedule the follow-up appointment.

## 2017-05-17 ENCOUNTER — Encounter: Payer: Self-pay | Admitting: Cardiology

## 2017-05-17 NOTE — Assessment & Plan Note (Signed)
Potentially related to anemia, but her most recent hemoglobin was still relatively stable. Probably still medication related with her blood pressure being low. On the back down all the way to 3.125 milligrams twice a day of carvedilol. This way we are on minimal beta blocker. May actually end up needing to stop it altogether, which I am reluctant to based on her hypertensive episode last year.

## 2017-05-17 NOTE — Assessment & Plan Note (Signed)
No recurrent anginal symptoms on carvedilol and Imdur.

## 2017-05-17 NOTE — Assessment & Plan Note (Signed)
Hypertension had not really been an issue for her before. I have a suspicion that she truly did have an ACS event leading to her hypertension as a posterior around. Currently she is hypotensive on minimal beta blocker.

## 2017-05-17 NOTE — Assessment & Plan Note (Signed)
She had accelerated hypertension with positive troponins back last year, but no obvious culprit lesion found. Likely microvascular in nature. No recurrent angina now. She remains on carvedilol, albeit now to reduce dose down to 3.125 mg twice a day. She is on Plavix and statin along with Imdur.

## 2017-05-17 NOTE — Assessment & Plan Note (Addendum)
Her labs have been followed by her endocrinologist/PCP. Not at goal at all. She is not doing the PC ACE inhibitor because of some unusual "allergy "issue. I'm in a have her contacted by our [place] Team to discuss what the reason behind this was and if potentially Praluent could be be used instead.  - Will need to recheck lipid panel for the most up-to-date lipid panel if we are going to try Praluent versus Repatha.

## 2017-05-19 DIAGNOSIS — I25119 Atherosclerotic heart disease of native coronary artery with unspecified angina pectoris: Secondary | ICD-10-CM | POA: Diagnosis not present

## 2017-05-19 DIAGNOSIS — R5383 Other fatigue: Secondary | ICD-10-CM | POA: Diagnosis not present

## 2017-05-19 DIAGNOSIS — E785 Hyperlipidemia, unspecified: Secondary | ICD-10-CM | POA: Diagnosis not present

## 2017-05-20 LAB — HEPATIC FUNCTION PANEL
ALT: 24 IU/L (ref 0–32)
AST: 30 IU/L (ref 0–40)
Albumin: 4.4 g/dL (ref 3.5–4.7)
Alkaline Phosphatase: 48 IU/L (ref 39–117)
Bilirubin Total: 0.3 mg/dL (ref 0.0–1.2)
Bilirubin, Direct: 0.12 mg/dL (ref 0.00–0.40)
Total Protein: 7.3 g/dL (ref 6.0–8.5)

## 2017-05-20 LAB — LIPID PANEL
CHOL/HDL RATIO: 2.6 ratio (ref 0.0–4.4)
Cholesterol, Total: 138 mg/dL (ref 100–199)
HDL: 54 mg/dL (ref >39)
LDL CALC: 73 mg/dL (ref 0–99)
TRIGLYCERIDES: 55 mg/dL (ref 0–149)
VLDL Cholesterol Cal: 11 mg/dL (ref 5–40)

## 2017-06-02 ENCOUNTER — Ambulatory Visit (INDEPENDENT_AMBULATORY_CARE_PROVIDER_SITE_OTHER): Payer: Medicare Other | Admitting: Pharmacist

## 2017-06-02 DIAGNOSIS — E785 Hyperlipidemia, unspecified: Secondary | ICD-10-CM | POA: Diagnosis not present

## 2017-06-02 DIAGNOSIS — I209 Angina pectoris, unspecified: Secondary | ICD-10-CM

## 2017-06-02 DIAGNOSIS — I25119 Atherosclerotic heart disease of native coronary artery with unspecified angina pectoris: Secondary | ICD-10-CM | POA: Diagnosis not present

## 2017-06-02 DIAGNOSIS — I739 Peripheral vascular disease, unspecified: Secondary | ICD-10-CM

## 2017-06-02 NOTE — Patient Instructions (Addendum)
[facility] (pharmacist) Arline Ketter/Kristin [phone]   Cholesterol Cholesterol is a fat. Your body needs a small amount of cholesterol. Cholesterol (plaque) may build up in your blood vessels (arteries). That makes you more likely to have a heart attack or stroke. You cannot feel your cholesterol level. Having a blood test is the only way to find out if your level is high. Keep your test results. Work with your doctor to keep your cholesterol at a good level. What do the results mean?  Total cholesterol is how much cholesterol is in your blood.  LDL is bad cholesterol. This is the type that can build up. Try to have low LDL.  HDL is good cholesterol. It cleans your blood vessels and carries LDL away. Try to have high HDL.  Triglycerides are fat that the body can store or burn for energy. What are good levels of cholesterol?  Total cholesterol below 200.  LDL below 100 is good for people who have health risks. LDL below 70 is good for people who have very high risks.  HDL above 40 is good. It is best to have HDL of 60 or higher.  Triglycerides below 150. How can I lower my cholesterol? Diet Follow your diet program as told by your doctor.  Choose fish, white meat chicken, or [place] that is roasted or baked. Try not to eat red meat, fried foods, sausage, or lunch meats.  Eat lots of fresh fruits and vegetables.  Choose whole grains, beans, pasta, potatoes, and cereals.  Choose olive oil, corn oil, or canola oil. Only use small amounts.  Try not to eat butter, mayonnaise, shortening, or palm kernel oils.  Try not to eat foods with trans fats.  Choose low-fat or nonfat dairy foods. ? Drink skim or nonfat milk. ? Eat low-fat or nonfat yogurt and cheeses. ? Try not to drink whole milk or cream. ? Try not to eat ice cream, egg yolks, or full-fat cheeses.  Healthy desserts include angel food cake, ginger snaps, animal crackers, hard candy, popsicles, and low-fat or nonfat  frozen yogurt. Try not to eat pastries, cakes, pies, and cookies.  Exercise Follow your exercise program as told by your doctor.  Be more active. Try gardening, walking, and taking the stairs.  Ask your doctor about ways that you can be more active.  Medicine  Take over-the-counter and prescription medicines only as told by your doctor.  This information is not intended to replace advice given to you by your health care provider. Make sure you discuss any questions you have with your health care provider. Document Released: 02/25/2009 Document Revised: 06/30/2016 Document Reviewed: 06/10/2016 Elsevier Interactive Patient Education  2017 [employer].

## 2017-06-02 NOTE — Progress Notes (Signed)
Patient ID: Monica BENETT                 DOB: 05/05/1933                    MRN: [id number]     HPI: [name] is a 83 y.o. female patient referred to lipid clinic by Dr Ellyn Hack. PMH is significant for dyslipidemia, claudication, CAD, PAD, diabetes, CVA and stent placement on 05/16/2013.  Patient experienced recent fall on 03/28/2017 due to hypotension. BP medication were adjusted on 05/16/2017 by Dr Ellyn Hack but noted elevated LDL at 140mg /dL. Apparently patient was considered Repatha on the past (about 2 years ago) but due to latex allergy, PCSK9 inhibitors were never initiated.    Patient presents to lipid clinic today for therapy assessment and potential initiation of PCSK9 inhibitor. Denies problems taking rosuvastatin 40mg  and reports a significant improvement in diet for the past 1-2 months.   Current Medications: Rosuvastatin 40mg  daily   Intolerances:  Atorvastatin 80mg  and atorvastatin 40mg   LDL goal: < 70mg /dL  Diet: Recently changed to no tried food, baked or broil chicken, fresh vegetables and fruits.   Exercise: activity of daily living; caretake for sick husband, not additional exercise regimen  Family History:  Hyperlipidemia in her father and mother; Hypertension in her father and mother.  Social History: denies tobacco use or alcohol use  Labs: CHO 138; TG 55; HDL 54; LDL 73 (rosuvastatin 40mg ) on 05/19/2017            CHO 207; TG 85; HDL 50; LDL 140 on 01/14/2017  Past Medical History:  Diagnosis Date  . Atherosclerotic heart disease native coronary artery w/angina pectoris ([place]) 1992   a. 1992 s/p PTCA of LAD;  b. 1998 CABG x 3; c. 07/2001 Cath: Sev LM/LAD/LCX dzs, 3/3 patent grafts; d. 11/2013 Cath: stable graft anatomy; e. 10/2016 Cath: native 3VD, VG->OM nl, LIMA->LAD nl, VG->Diag 55.  . Bradycardia, mild briefly to the 40s, asymptomatic 05/16/2013  . Carotid arterial disease ([place])    a. 05/2014 Carotid U/S: No signif bilat ICA stenosis, >60% L ECA.  Marland Kitchen Chronic anemia     . Chronic diastolic CHF (congestive heart failure) ([place])    a. 10/2016 Echo: Ef 55-60%, no rwma, Gr1 DD, triv AI, PASP 35mmHg, atrial septal aneurysm.  . DM (diabetes mellitus), type 2 with peripheral vascular complications (HCC)    On Oral medications.  . Essential hypertension   . GERD (gastroesophageal reflux disease)   . Hyperlipidemia with target LDL less than 70   . PAD (peripheral artery disease) ([place]) 05/2013; 09/2013   a. severe calcified POP-1 & TP Trunk Dz bilat;  b . 05/2013 Diamondback Rot Athrectomy (R POP) --> PTA R Pop, DES to R Peroneal;  c. 09/2013 PTA/Stenting of L Pop [name] - retrograde access);  d. 04/2014 ABI's: R/L: 1.1/1.1.  Marland Kitchen Peptic ulcer disease   . S/P CABG x 3 1998   LIMA-LAD, SVG-OM, SVG-D1  . Severe claudication ([place]) 05/2013   Referred for Peripheral Angio (Dr. Gwenlyn Found --> Dr. Brunetta Jeans in [place])    Current Outpatient Prescriptions on File Prior to Visit  Medication Sig Dispense Refill  . acetaminophen (TYLENOL) 500 MG tablet Take 1,000 mg by mouth every 6 (six) hours as needed.    . carvedilol (COREG) 3.125 MG tablet Take 1 tablet (3.125 mg total) by mouth 2 (two) times daily. 180 tablet 3  . clopidogrel (PLAVIX) 75 MG tablet Take 75 mg  by mouth daily.    . Coenzyme Q10 (CO Q 10 PO) Take 1 tablet by mouth daily.     Marland Kitchen estradiol (ESTRACE) 1 MG tablet Take 1 mg by mouth daily.    Marland Kitchen ezetimibe (ZETIA) 10 MG tablet Take 10 mg by mouth daily.    Marland Kitchen gabapentin (NEURONTIN) 300 MG capsule Take 1 capsule (300 mg total) by mouth 3 (three) times daily. 90 capsule 3  . isosorbide mononitrate (IMDUR) 60 MG 24 hr tablet TAKE 1 TABLET (60 MG TOTAL) BY MOUTH DAILY. 30 tablet 10  . Multiple Vitamins-Minerals (CENTRUM SILVER PO) Take 1 tablet by mouth daily.    . nitroGLYCERIN (NITROSTAT) 0.4 MG SL tablet Place 0.4 mg under the tongue every 5 (five) minutes as needed for chest pain.    . pantoprazole (PROTONIX) 40 MG tablet TAKE 1 TABLET BY MOUTH DAILY 30 tablet 5  .  rosuvastatin (CRESTOR) 40 MG tablet Take 1 tablet (40mg ) by mouth daily alternating with 1/2 tablet (20mg ) for ONE MONTH then increase to 1 tablet daily. 90 tablet 3  . TRULICITY 1.5 XV/4.0GQ SOPN Inject 1.5 mg into the skin once a week.  5   No current facility-administered medications on file prior to visit.     Allergies  Allergen Reactions  . Fish Allergy Hives and Shortness Of Breath  . Ivp Dye [Iodinated Diagnostic Agents] Shortness Of Breath and Anaphylaxis  . Latex Hives, Shortness Of Breath and Itching    REACTION: wheezing  . Shellfish-Derived Products Anaphylaxis and Hives  . Other     Patient reports allergy to perfumed detergents. Causes itching.  . Metformin Nausea And Vomiting    Dyslipidemia: LDL decreased by 50% with rosuvastatin 40mg  daily and lifestyle modification only. LDL now at desired goal for secondary prevention. Patient is tolerating medication and denies ADR. Appropriate to continue current therapy without changes and follow up lipid panel in 6 months. If patient unable to continue rosuvastatin 40mg  daily, will consider Praluent 150mg  every 14 days (latex free PCSK9i), but no changed are recommended at this time.  Holly Iannaccone Rodriguez-Guzman PharmD, [place] [address] 06/02/2017 11:19 AM

## 2017-06-13 ENCOUNTER — Telehealth: Payer: Self-pay | Admitting: *Deleted

## 2017-06-13 NOTE — Telephone Encounter (Signed)
LEFT MESSAGE WITH RESULTS . =PER DPI IF RESULT NUMBERS ARE WANTED CALL BACK FOR MORE DETAILED.

## 2017-06-13 NOTE — Telephone Encounter (Signed)
-----   Message from [name], MD sent at 06/09/2017  9:43 PM EDT ----- Cholesterol panel looks great now. It appears that we have probably started a new medicine that is doing the job.  [name], MD  Please forward to PCP: [name], MD

## 2017-07-18 DIAGNOSIS — E118 Type 2 diabetes mellitus with unspecified complications: Secondary | ICD-10-CM | POA: Diagnosis not present

## 2017-07-21 DIAGNOSIS — N390 Urinary tract infection, site not specified: Secondary | ICD-10-CM | POA: Diagnosis not present

## 2017-07-21 DIAGNOSIS — E118 Type 2 diabetes mellitus with unspecified complications: Secondary | ICD-10-CM | POA: Diagnosis not present

## 2017-07-22 DIAGNOSIS — E118 Type 2 diabetes mellitus with unspecified complications: Secondary | ICD-10-CM | POA: Diagnosis not present

## 2017-07-22 DIAGNOSIS — I10 Essential (primary) hypertension: Secondary | ICD-10-CM | POA: Diagnosis not present

## 2017-07-22 DIAGNOSIS — L309 Dermatitis, unspecified: Secondary | ICD-10-CM | POA: Diagnosis not present

## 2017-08-11 ENCOUNTER — Encounter (HOSPITAL_COMMUNITY): Payer: Self-pay | Admitting: Physician Assistant

## 2017-08-11 ENCOUNTER — Encounter: Payer: Self-pay | Admitting: Physician Assistant

## 2017-08-11 ENCOUNTER — Ambulatory Visit (INDEPENDENT_AMBULATORY_CARE_PROVIDER_SITE_OTHER): Payer: Medicare Other | Admitting: Physician Assistant

## 2017-08-11 ENCOUNTER — Telehealth: Payer: Self-pay | Admitting: *Deleted

## 2017-08-11 ENCOUNTER — Observation Stay (HOSPITAL_COMMUNITY)
Admission: AD | Admit: 2017-08-11 | Discharge: 2017-08-12 | Disposition: A | Discharge status: Home / Self Care | Payer: Medicare Other | Source: Ambulatory Visit | Attending: Cardiology | Admitting: Cardiology

## 2017-08-11 VITALS — BP 142/80 | HR 69 | Ht 63.0 in

## 2017-08-11 DIAGNOSIS — Z79899 Other long term (current) drug therapy: Secondary | ICD-10-CM | POA: Diagnosis not present

## 2017-08-11 DIAGNOSIS — E119 Type 2 diabetes mellitus without complications: Secondary | ICD-10-CM | POA: Diagnosis not present

## 2017-08-11 DIAGNOSIS — I10 Essential (primary) hypertension: Secondary | ICD-10-CM

## 2017-08-11 DIAGNOSIS — I5032 Chronic diastolic (congestive) heart failure: Secondary | ICD-10-CM

## 2017-08-11 DIAGNOSIS — R079 Chest pain, unspecified: Principal | ICD-10-CM | POA: Insufficient documentation

## 2017-08-11 DIAGNOSIS — Z951 Presence of aortocoronary bypass graft: Secondary | ICD-10-CM | POA: Insufficient documentation

## 2017-08-11 DIAGNOSIS — I2510 Atherosclerotic heart disease of native coronary artery without angina pectoris: Secondary | ICD-10-CM | POA: Diagnosis not present

## 2017-08-11 DIAGNOSIS — Z7902 Long term (current) use of antithrombotics/antiplatelets: Secondary | ICD-10-CM | POA: Insufficient documentation

## 2017-08-11 DIAGNOSIS — I25119 Atherosclerotic heart disease of native coronary artery with unspecified angina pectoris: Secondary | ICD-10-CM

## 2017-08-11 DIAGNOSIS — M79609 Pain in unspecified limb: Secondary | ICD-10-CM | POA: Diagnosis present

## 2017-08-11 DIAGNOSIS — Z91040 Latex allergy status: Secondary | ICD-10-CM | POA: Diagnosis not present

## 2017-08-11 DIAGNOSIS — E782 Mixed hyperlipidemia: Secondary | ICD-10-CM | POA: Diagnosis not present

## 2017-08-11 DIAGNOSIS — I110 Hypertensive heart disease with heart failure: Secondary | ICD-10-CM | POA: Diagnosis not present

## 2017-08-11 DIAGNOSIS — Z91013 Allergy to seafood: Secondary | ICD-10-CM | POA: Insufficient documentation

## 2017-08-11 DIAGNOSIS — I739 Peripheral vascular disease, unspecified: Secondary | ICD-10-CM

## 2017-08-11 DIAGNOSIS — E785 Hyperlipidemia, unspecified: Secondary | ICD-10-CM | POA: Diagnosis present

## 2017-08-11 DIAGNOSIS — E1169 Type 2 diabetes mellitus with other specified complication: Secondary | ICD-10-CM | POA: Diagnosis present

## 2017-08-11 HISTORY — DX: Chronic diastolic (congestive) heart failure: I50.32

## 2017-08-11 LAB — CBC WITH DIFFERENTIAL/PLATELET
BASOS ABS: 0.1 10*3/uL (ref 0.0–0.1)
BASOS PCT: 1 %
Eosinophils Absolute: 0.3 10*3/uL (ref 0.0–0.7)
Eosinophils Relative: 4 %
HEMATOCRIT: 38.4 % (ref 36.0–46.0)
HEMOGLOBIN: 11.8 g/dL — AB (ref 12.0–15.0)
LYMPHS PCT: 33 %
Lymphs Abs: 2.1 10*3/uL (ref 0.7–4.0)
MCH: 25.9 pg — ABNORMAL LOW (ref 26.0–34.0)
MCHC: 30.7 g/dL (ref 30.0–36.0)
MCV: 84.4 fL (ref 78.0–100.0)
MONOS PCT: 11 %
Monocytes Absolute: 0.7 10*3/uL (ref 0.1–1.0)
NEUTROS ABS: 3.3 10*3/uL (ref 1.7–7.7)
NEUTROS PCT: 51 %
Platelets: 275 10*3/uL (ref 150–400)
RBC: 4.55 MIL/uL (ref 3.87–5.11)
RDW: 14.1 % (ref 11.5–15.5)
WBC: 6.3 10*3/uL (ref 4.0–10.5)

## 2017-08-11 LAB — CREATININE, SERUM
Creatinine, Ser: 1.06 mg/dL — ABNORMAL HIGH (ref 0.44–1.00)
GFR calc non Af Amer: 48 mL/min — ABNORMAL LOW (ref >60)
GFR, EST AFRICAN AMERICAN: 56 mL/min — AB (ref >60)

## 2017-08-11 LAB — GLUCOSE, CAPILLARY: Glucose-Capillary: 146 mg/dL — ABNORMAL HIGH (ref 65–99)

## 2017-08-11 LAB — PROTIME-INR
INR: 0.95
Prothrombin Time: 12.6 seconds (ref 11.4–15.2)

## 2017-08-11 LAB — TROPONIN I: Troponin I: <0.03 ng/mL (ref <0.03)

## 2017-08-11 MED ORDER — DULAGLUTIDE 1.5 MG/0.5ML ~~LOC~~ SOAJ: 1.5000 mg | SUBCUTANEOUS | Status: DC

## 2017-08-11 MED ORDER — ENOXAPARIN SODIUM 40 MG/0.4ML ~~LOC~~ SOLN
40.0000 mg | SUBCUTANEOUS | Status: DC
  Administered 2017-08-11: 40 mg via SUBCUTANEOUS
  Filled 2017-08-11: qty 0.4

## 2017-08-11 MED ORDER — SODIUM CHLORIDE 0.9 % IV SOLN: 250.0000 mL | INTRAVENOUS | Status: DC | PRN

## 2017-08-11 MED ORDER — GI COCKTAIL ~~LOC~~: 30.0000 mL | Freq: Two times a day (BID) | ORAL | Status: DC | PRN

## 2017-08-11 MED ORDER — ACETAMINOPHEN 325 MG PO TABS: 650.0000 mg | ORAL_TABLET | ORAL | Status: DC | PRN

## 2017-08-11 MED ORDER — ROSUVASTATIN CALCIUM 10 MG PO TABS
40.0000 mg | ORAL_TABLET | Freq: Every day | ORAL | Status: DC
  Administered 2017-08-12: 40 mg via ORAL
  Filled 2017-08-11: qty 4

## 2017-08-11 MED ORDER — CARVEDILOL 3.125 MG PO TABS
3.1250 mg | ORAL_TABLET | Freq: Two times a day (BID) | ORAL | Status: DC
  Administered 2017-08-12 (×2): 3.125 mg via ORAL
  Filled 2017-08-11 (×3): qty 1

## 2017-08-11 MED ORDER — GABAPENTIN 300 MG PO CAPS
300.0000 mg | ORAL_CAPSULE | Freq: Three times a day (TID) | ORAL | Status: DC
  Administered 2017-08-11 – 2017-08-12 (×3): 300 mg via ORAL
  Filled 2017-08-11 (×3): qty 1

## 2017-08-11 MED ORDER — ONDANSETRON HCL 4 MG/2ML IJ SOLN: 4.0000 mg | Freq: Four times a day (QID) | INTRAMUSCULAR | Status: DC | PRN

## 2017-08-11 MED ORDER — EZETIMIBE 10 MG PO TABS
10.0000 mg | ORAL_TABLET | Freq: Every day | ORAL | Status: DC
  Administered 2017-08-12: 10 mg via ORAL
  Filled 2017-08-11: qty 1

## 2017-08-11 MED ORDER — ALPRAZOLAM 0.25 MG PO TABS: 0.2500 mg | ORAL_TABLET | Freq: Two times a day (BID) | ORAL | Status: DC | PRN

## 2017-08-11 MED ORDER — SODIUM CHLORIDE 0.9% FLUSH: 3.0000 mL | INTRAVENOUS | Status: DC | PRN

## 2017-08-11 MED ORDER — INSULIN ASPART 100 UNIT/ML ~~LOC~~ SOLN: 0.0000 [IU] | Freq: Every day | SUBCUTANEOUS | Status: DC

## 2017-08-11 MED ORDER — CLOPIDOGREL BISULFATE 75 MG PO TABS
75.0000 mg | ORAL_TABLET | Freq: Every day | ORAL | Status: DC
  Administered 2017-08-12: 75 mg via ORAL
  Filled 2017-08-11: qty 1

## 2017-08-11 MED ORDER — SODIUM CHLORIDE 0.9% FLUSH
3.0000 mL | Freq: Two times a day (BID) | INTRAVENOUS | Status: DC
  Administered 2017-08-11 – 2017-08-12 (×2): 3 mL via INTRAVENOUS

## 2017-08-11 MED ORDER — CO Q 10 100 MG PO CAPS: ORAL_CAPSULE | Freq: Every day | ORAL | Status: DC

## 2017-08-11 MED ORDER — ZOLPIDEM TARTRATE 5 MG PO TABS: 5.0000 mg | ORAL_TABLET | Freq: Every evening | ORAL | Status: DC | PRN

## 2017-08-11 MED ORDER — NITROGLYCERIN 0.4 MG SL SUBL: 0.4000 mg | SUBLINGUAL_TABLET | SUBLINGUAL | Status: DC | PRN

## 2017-08-11 MED ORDER — PANTOPRAZOLE SODIUM 40 MG PO TBEC
40.0000 mg | DELAYED_RELEASE_TABLET | Freq: Two times a day (BID) | ORAL | Status: DC
  Administered 2017-08-12 (×2): 40 mg via ORAL
  Filled 2017-08-11 (×2): qty 1

## 2017-08-11 MED ORDER — INSULIN ASPART 100 UNIT/ML ~~LOC~~ SOLN: 0.0000 [IU] | Freq: Three times a day (TID) | SUBCUTANEOUS | Status: DC
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
## 2017-08-11 NOTE — Telephone Encounter (Signed)
Patient was in office with husband, requested appointment at check out for herself to evaluate for chest pain.   Notified by checkout staff.  Patient reports episodes of "chest heaviness" beginning 2 days ago, midsternal with associated arm pain.  Reports taking 3 NTG last night with decrease in pain.  Reports feeling as if she was going to vomit as well.   Reports being woken up with BL arm pain last night.    Patient reports "heaviness" as 2/10 at current.  States "it feels like I need to burp"  "its an off feeling".   Denies SOB, diaphoresis, lightheadedness, dizziness.   Reports pain does not increase with exertion or relieved with rest.   Chart review:  Hx CABG, cath in 2017, HTN.     Spoke with [name] PA, added onto schedule to evaluate.

## 2017-08-11 NOTE — Progress Notes (Deleted)
Patient stated that she is missing home bottle of plavix.  Marla NT called to check at [facility] med center.  RN to call back.
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
## 2017-08-12 ENCOUNTER — Observation Stay (HOSPITAL_BASED_OUTPATIENT_CLINIC_OR_DEPARTMENT_OTHER): Payer: Medicare Other

## 2017-08-12 DIAGNOSIS — Z91013 Allergy to seafood: Secondary | ICD-10-CM | POA: Diagnosis not present

## 2017-08-12 DIAGNOSIS — Z951 Presence of aortocoronary bypass graft: Secondary | ICD-10-CM | POA: Diagnosis not present

## 2017-08-12 DIAGNOSIS — E119 Type 2 diabetes mellitus without complications: Secondary | ICD-10-CM | POA: Diagnosis not present

## 2017-08-12 DIAGNOSIS — Z79899 Other long term (current) drug therapy: Secondary | ICD-10-CM | POA: Diagnosis not present

## 2017-08-12 DIAGNOSIS — I110 Hypertensive heart disease with heart failure: Secondary | ICD-10-CM | POA: Diagnosis not present

## 2017-08-12 DIAGNOSIS — I2510 Atherosclerotic heart disease of native coronary artery without angina pectoris: Secondary | ICD-10-CM | POA: Diagnosis not present

## 2017-08-12 DIAGNOSIS — R079 Chest pain, unspecified: Secondary | ICD-10-CM

## 2017-08-12 DIAGNOSIS — I5032 Chronic diastolic (congestive) heart failure: Secondary | ICD-10-CM | POA: Diagnosis not present

## 2017-08-12 DIAGNOSIS — Z91040 Latex allergy status: Secondary | ICD-10-CM | POA: Diagnosis not present

## 2017-08-12 DIAGNOSIS — Z7902 Long term (current) use of antithrombotics/antiplatelets: Secondary | ICD-10-CM | POA: Diagnosis not present

## 2017-08-12 LAB — GLUCOSE, CAPILLARY: GLUCOSE-CAPILLARY: 105 mg/dL — AB (ref 65–99)

## 2017-08-12 LAB — COMPREHENSIVE METABOLIC PANEL
ALK PHOS: 42 U/L (ref 38–126)
ALT: 21 U/L (ref 14–54)
AST: 27 U/L (ref 15–41)
Albumin: 3.6 g/dL (ref 3.5–5.0)
Anion gap: 9 (ref 5–15)
BILIRUBIN TOTAL: 0.6 mg/dL (ref 0.3–1.2)
BUN: 14 mg/dL (ref 6–20)
CALCIUM: 9 mg/dL (ref 8.9–10.3)
CO2: 27 mmol/L (ref 22–32)
CREATININE: 0.93 mg/dL (ref 0.44–1.00)
Chloride: 105 mmol/L (ref 101–111)
GFR, EST NON AFRICAN AMERICAN: 56 mL/min — AB (ref >60)
Glucose, Bld: 120 mg/dL — ABNORMAL HIGH (ref 65–99)
Potassium: 3.7 mmol/L (ref 3.5–5.1)
Sodium: 141 mmol/L (ref 135–145)
TOTAL PROTEIN: 6.9 g/dL (ref 6.5–8.1)

## 2017-08-12 LAB — TROPONIN I

## 2017-08-12 LAB — NM MYOCAR MULTI W/SPECT W/WALL MOTION / EF
CHL CUP RESTING HR STRESS: 53 {beats}/min
CSEPPHR: 103 {beats}/min
Exercise duration (min): 5 min
Exercise duration (sec): 48 s
LVDIAVOL: 73 mL (ref 46–106)
LVSYSVOL: 31 mL
TID: 1.05

## 2017-08-12 MED ORDER — TECHNETIUM TC 99M TETROFOSMIN IV KIT
30.0000 | PACK | Freq: Once | INTRAVENOUS | Status: AC | PRN
  Administered 2017-08-12: 30 via INTRAVENOUS

## 2017-08-12 MED ORDER — GI COCKTAIL ~~LOC~~
30.0000 mL | Freq: Two times a day (BID) | ORAL | Status: DC | PRN
  Administered 2017-08-12: 30 mL via ORAL
  Filled 2017-08-12: qty 30

## 2017-08-12 MED ORDER — TECHNETIUM TC 99M TETROFOSMIN IV KIT
10.0000 | PACK | Freq: Once | INTRAVENOUS | Status: AC | PRN
  Administered 2017-08-12: 10 via INTRAVENOUS

## 2017-08-12 MED ORDER — PANTOPRAZOLE SODIUM 40 MG PO TBEC: 40.0000 mg | DELAYED_RELEASE_TABLET | Freq: Two times a day (BID) | ORAL | 1 refills | Status: DC

## 2017-08-12 MED ORDER — REGADENOSON 0.4 MG/5ML IV SOLN
INTRAVENOUS | Status: AC
  Administered 2017-08-12: 0.4 mg via INTRAVENOUS
  Filled 2017-08-12: qty 5

## 2017-08-12 MED ORDER — REGADENOSON 0.4 MG/5ML IV SOLN
0.4000 mg | Freq: Once | INTRAVENOUS | Status: AC
  Administered 2017-08-12: 0.4 mg via INTRAVENOUS
  Filled 2017-08-12: qty 5

## 2017-08-12 NOTE — Progress Notes (Signed)
Progress Note  Patient Name: [name] Date of Encounter: 08/12/2017  Primary Cardiologist: [name]  Subjective   The patient seen in the stress lab. She had resolution of chest pain yesterday with NTG and aspirin. This Am she has had mild chest pressure 2/10 since awakening. This has been steady. No shortness of breath or associated symptoms. She is tired lately.   Inpatient Medications    Scheduled Meds: . carvedilol  3.125 mg Oral BID  . clopidogrel  75 mg Oral Daily  . [START ON 08/15/2017] Dulaglutide  1.5 mg Subcutaneous Weekly  . enoxaparin (LOVENOX) injection  40 mg Subcutaneous Q24H  . ezetimibe  10 mg Oral Daily  . gabapentin  300 mg Oral TID  . insulin aspart  0-15 Units Subcutaneous TID WC  . insulin aspart  0-5 Units Subcutaneous QHS  . pantoprazole  40 mg Oral BID AC  . rosuvastatin  40 mg Oral Daily  . sodium chloride flush  3 mL Intravenous Q12H   Continuous Infusions: . sodium chloride     PRN Meds: sodium chloride, acetaminophen, ALPRAZolam, gi cocktail, nitroGLYCERIN, ondansetron (ZOFRAN) IV, sodium chloride flush, zolpidem   Vital Signs    Vitals:   08/12/17 0041 08/12/17 0444 08/12/17 0804 08/12/17 1202  BP:  117/61 (!) 147/75 139/90  Pulse: 68 66 64   Resp:  18 18   Temp:  98.8 F (37.1 C) 98.1 F (36.7 C)   TempSrc:  Oral Oral   SpO2:  98% 100%   Weight:  157 lb 14.4 oz (71.6 kg)    Height:        Intake/Output Summary (Last 24 hours) at 08/12/17 1225 Last data filed at 08/12/17 0700  Gross per 24 hour  Intake                0 ml  Output              800 ml  Net             -800 ml   Filed Weights   08/11/17 1847 08/12/17 0444  Weight: 160 lb 4.8 oz (72.7 kg) 157 lb 14.4 oz (71.6 kg)    Telemetry    NSR - Personally Reviewed  ECG    NSR with non-specific ST changes V!-V1 - Personally Reviewed  Physical Exam   GEN: No acute distress.   Neck: No JVD Cardiac: RRR, no murmurs, rubs, or gallops.  Respiratory: Clear to  auscultation bilaterally. GI: Soft, nontender, non-distended  MS: No edema; No deformity. Neuro:  Nonfocal  Psych: Normal affect   Labs    Chemistry Recent Labs Lab 08/11/17 1918 08/12/17 0541  NA  --  141  K  --  3.7  CL  --  105  CO2  --  27  GLUCOSE  --  120*  BUN  --  14  CREATININE 1.06* 0.93  CALCIUM  --  9.0  PROT  --  6.9  ALBUMIN  --  3.6  AST  --  27  ALT  --  21  ALKPHOS  --  42  BILITOT  --  0.6  GFRNONAA 48* 56*  GFRAA 56* >60  ANIONGAP  --  9     Hematology Recent Labs Lab 08/11/17 1918  WBC 6.3  RBC 4.55  HGB 11.8*  HCT 38.4  MCV 84.4  MCH 25.9*  MCHC 30.7  RDW 14.1  PLT 275    Cardiac Enzymes Recent Labs  Lab 08/11/17 1918 08/12/17 0037 08/12/17 0541  TROPONINI <0.03 <0.03 <0.03   No results for input(s): TROPIPOC in the last 168 hours.   BNPNo results for input(s): BNP, PROBNP in the last 168 hours.   DDimer No results for input(s): DDIMER in the last 168 hours.   Radiology    No results found.  Cardiac Studies   CATH: 96/03/5408  LV end diastolic pressure is mildly elevated. In the setting of systemic hypertension  SVG-diagonal is moderate in size. Origin lesion, 55 %stenosed.  LM lesion, 55 %stenosed.  Ost LAD lesion, 100 %stenosed.  Prox LAD to Mid LAD lesion, 100 %stenosed. After D1.  Very small caliber Ost Ramus lesion, 70 %stenosed.  Ost Cx to Prox Cx lesion, 80 %stenosed. Prox Cx lesion, 70 %stenosed.  SVG-OM is large and anatomically normal. The flow in the graft is reversed. The bifurcating Cx OM is relatively normal.  Very tortuous L Subclavian artery - unable to perform L Radial Cath. Patient has relatively stable coronary disease with maybe mild progression of the vein graft to the diagonal branch ostial disease that was maybe 40% previously. This does not appear to be enough flow limiting to cause resting symptoms, however most likely etiology is accelerated hypertension, accelerated LVEDP and  microvascular disease with a small ramus branch and other branches from the native system. PLAN:   Admit to step down for ongoing blood pressure control. Will use IV nitroglycerin overnight. I will increase carvedilol to 6.25 twice a day and add amlodipine.  Continue aspirin, Plavix and statin  Can stop IV heparin  ECHO: 11/02/2016 - Left ventricle: The cavity size was normal. There was mild focal basal hypertrophy of the septum. Systolic function was normal. The estimated ejection fraction was in the range of 55% to 60%. Wall motion was normal; there were no regional wall motion abnormalities. Doppler parameters are consistent with abnormal left ventricular relaxation (grade 1 diastolic dysfunction). - Aortic valve: There was trivial regurgitation. - Mitral valve: Calcified annulus. - Atrial septum: There was an atrial septal aneurysm. - Pulmonary arteries: Systolic pressure was mildly increased. PA peak pressure: 35 mm Hg (S). Impressions: - Normal LV systolic function; grade 1 diastolic dysfunction; trace AI; mild TR; mildly elevated pulmonary pressure.    Patient Profile     83 y.o. female with a history of CABG 1998 w/ subsequent caths, severe 3 v dz w/ patent LIMA-LAD & SVG-OM, SVG-Diag 55% 10/2016, EF 55% 10/2016, hypertensive heart dz, D-CHF, GERD, PAD seen in the office for onset of SSCP at rest. 7/10 at first, down to a 3/10 after SL NTG x 3. She felt like she needed to burp. Yesterday, she continued to have general malaise and fatigue, decreased activity tolerance.  Assessment & Plan    1.  Chest pain, high risk for cardiac etiology: Cath 10/2016 w/ severe native 3 v dz and patent grafts, but SVG-OM had 55% stenosis, rx medically. She is on Plavix, Imdur 60 mg, but not on BB 2nd hypotension (when Dr Ellyn Hack saw her in June). She was on Coreg 12.5 mg bid>>d/c'd and now on 3.125 mg bid.  Cardiac enzymes have been negative. She has had some mild constant  chest pressure since am with no associated symptoms. She is seen in the stress lab for nuclear stress test.  Awaiting results for further treatment recommendations. If stress test is positive she will need cardiac cath.  Protonix has been increased to BID for possible GI etiology.   2. D-CHF:  Her weight is stable and her volume status is good by exam. Wt is down 3 pounds form yesterday. Continue current therapy.  3. PAD: Has had PTI to her peroneal arteries. On Plavix.    Signed, Daune Perch, NP  08/12/2017, 12:25 PM    As above; pt seen and examined; she continues with chest heaviness that has been persistent since Tuesday; note some improvement with GI cocktail this AM; enzymes negative; plan to await results of nuclear study; if negative, pt can be DCed and fu with Dr Ellyn Hack.  Kirk Ruths, MD

## 2017-08-12 NOTE — Discharge Summary (Signed)
Discharge Summary    Patient ID: [name],  MRN: [id number], DOB/AGE: 22-Jul-1933 83 y.o.  Admit date: 08/11/2017 Discharge date: 08/12/2017  Primary Care Provider: [name] Primary Cardiologist: [name]  Discharge Diagnoses    Principal Problem:   Chest pain with high risk for cardiac etiology Active Problems:   Dyslipidemia, goal LDL below 70   Limb pain- echymosis, pain Lt radial artery after cath   Non-insulin dependent type 2 diabetes mellitus (HCC)   Chronic diastolic CHF (congestive heart failure), NYHA class 2 (HCC)   Allergies Allergies  Allergen Reactions  . Fish Allergy Hives and Shortness Of Breath  . Ivp Dye [Iodinated Diagnostic Agents] Shortness Of Breath and Anaphylaxis  . Latex Hives, Shortness Of Breath and Itching    REACTION: wheezing  . Shellfish-Derived Products Anaphylaxis and Hives  . Other     Patient reports allergy to perfumed detergents. Causes itching.  . Metformin Nausea Only    Diagnostic Studies/Procedures    Study Result  08/12/17   There was no ST segment deviation noted during stress.  T wave inversion of 1.5 mm was noted during stress in the V2, V3 and V4 leads, beginning at 1 minutes of stress, ending at 3 minutes of stress.  The study is normal.  This is a low risk study.  The left ventricular ejection fraction is normal (55-65%).     _____________   History of Present Illness     [name] is a 83 y.o. female with a history of CABG 1998 w/ subsequent caths, severe 3 v dz w/ patent LIMA-LAD & SVG-OM, SVG-Diag 55% 10/2016, EF 55% 10/2016, hypertensive heart dz, D-CHF, GERD, PAD. She reported chest pain while in the office and was referred to the ED. [name] was in her USOH till Tuesday 08/09/2017 when she had onset of SSCP at rest. 7/10 at first, down to a 3/10 after SL NTG x 3. She felt like she needed to burp. She has also had  continued general malaise and fatigue, decreased activity tolerance. She  continued to have chest pressure. She denies SOB with it, or DOE, just fatigue. She was nauseated at first, but that improved with the nitro. No diaphoresis. There were no acute EKG findings.    Cath 10/2016 w/ severe native 3 v dz and patent grafts, but SVG-OM had 55% stenosis, rx medically. She is on Plavix, Imdur 60 mg, but not on BB 2nd hypotension (when Dr Ellyn Hack saw her in June). She was on Coreg 12.5 mg bid>>d/c'd and now on 3.125 mg bid.    Hospital Course     Consultants: none  The patient was observed overnight. Her troponins were negative. She continues to have steady chest heaviness that has persisted since Tuesday and burping. She had some improvement this AM with GI cocktail.  She had a nuclear stress test today that was normal, low risk. She will be discharged on Protonix bid and can take Tums as needed. Follow up will be arranged with Dr Ellyn Hack.   Patient has been seen by Dr. Stanford Breed today and deemed ready for discharge home. All follow up appointments have been scheduled. Discharge medications are listed below. _____________  Discharge Vitals Blood pressure (!) 186/88, pulse 64, temperature 98.1 F (36.7 C), temperature source Oral, resp. rate 18, height 5\' 3"  (1.6 m), weight 157 lb 14.4 oz (71.6 kg), SpO2 100 %.  Filed Weights   08/11/17 1847 08/12/17 0444  Weight: 160 lb 4.8  oz (72.7 kg) 157 lb 14.4 oz (71.6 kg)    Labs & Radiologic Studies    CBC  Recent Labs  08/11/17 1918  WBC 6.3  NEUTROABS 3.3  HGB 11.8*  HCT 38.4  MCV 84.4  PLT 253   Basic Metabolic Panel  Recent Labs  08/11/17 1918 08/12/17 0541  NA  --  141  K  --  3.7  CL  --  105  CO2  --  27  GLUCOSE  --  120*  BUN  --  14  CREATININE 1.06* 0.93  CALCIUM  --  9.0   Liver Function Tests  Recent Labs  08/12/17 0541  AST 27  ALT 21  ALKPHOS 42  BILITOT 0.6  PROT 6.9  ALBUMIN 3.6   No results for input(s): LIPASE, AMYLASE in the last 72 hours. Cardiac Enzymes  Recent  Labs  08/11/17 1918 08/12/17 0037 08/12/17 0541  TROPONINI <0.03 <0.03 <0.03   BNP Invalid input(s): POCBNP D-Dimer No results for input(s): DDIMER in the last 72 hours. Hemoglobin A1C No results for input(s): HGBA1C in the last 72 hours. Fasting Lipid Panel No results for input(s): CHOL, HDL, LDLCALC, TRIG, CHOLHDL, LDLDIRECT in the last 72 hours. Thyroid Function Tests No results for input(s): TSH, T4TOTAL, T3FREE, THYROIDAB in the last 72 hours.  Invalid input(s): FREET3 _____________  Nm Myocar Multi W/spect W/wall Motion / Ef  Result Date: 08/12/2017  There was no ST segment deviation noted during stress.  T wave inversion of 1.5 mm was noted during stress in the V2, V3 and V4 leads, beginning at 1 minutes of stress, ending at 3 minutes of stress.  The study is normal.  This is a low risk study.  The left ventricular ejection fraction is normal (55-65%).    Disposition   Pt is being discharged home today in good condition.  Follow-up Plans & Appointments    Follow-up Information    Monica Man, MD Follow up.   Specialty:  Cardiology Why:  You will be called to arrange for follow up with [name] or his PA. Contact information: [address] [address] [phone]          Discharge Instructions    Diet - low sodium heart healthy    Complete by:  As directed    Discharge instructions    Complete by:  As directed    Try to limit doses of Naproxen as this can cause stomach irritation.   Increase activity slowly    Complete by:  As directed       Discharge Medications   Current Discharge Medication List    CONTINUE these medications which have CHANGED   Details  pantoprazole (PROTONIX) 40 MG tablet Take 1 tablet (40 mg total) by mouth 2 (two) times daily before a meal. Qty: 60 tablet, Refills: 1      CONTINUE these medications which have NOT CHANGED   Details  carvedilol (COREG) 3.125 MG tablet Take 1 tablet  (3.125 mg total) by mouth 2 (two) times daily. Qty: 180 tablet, Refills: 3    clopidogrel (PLAVIX) 75 MG tablet Take 75 mg by mouth daily.    Coenzyme Q10 (CO Q 10 PO) Take 1 tablet by mouth at bedtime.     ezetimibe (ZETIA) 10 MG tablet Take 10 mg by mouth daily.    gabapentin (NEURONTIN) 300 MG capsule Take 1 capsule (300 mg total) by mouth 3 (three) times daily. Qty: 90 capsule,  Refills: 3    isosorbide mononitrate (IMDUR) 60 MG 24 hr tablet Take 60 mg by mouth daily.    naproxen sodium (ANAPROX) 220 MG tablet Take 440 mg by mouth 2 (two) times daily as needed.    nitroGLYCERIN (NITROSTAT) 0.4 MG SL tablet Place 0.4 mg under the tongue every 5 (five) minutes as needed for chest pain.    rosuvastatin (CRESTOR) 40 MG tablet Take 40 mg by mouth daily.    TRULICITY 1.5 IF/0.2DX SOPN Inject 1.5 mg into the skin once a week. Refills: 5           Outstanding Labs/Studies   None  Duration of Discharge Encounter   Greater than 30 minutes including physician time.  Signed, Daune Perch NP 08/12/2017, 5:02 PM

## 2017-08-12 NOTE — Progress Notes (Signed)
    Patient presented for Lexiscan nuclear stress test. Tolerated procedure well. Pending final stress imaging result.  Daune Perch, AGNP-C 08/12/2017  12:39 PM Pager: [phone]

## 2017-08-12 NOTE — Progress Notes (Signed)
PT in stable condition and I reviewed d/c instructions. Pt d/c'd via wheelchair to private vehicle

## 2017-08-16 ENCOUNTER — Encounter: Payer: Self-pay | Admitting: Cardiology

## 2017-08-16 ENCOUNTER — Ambulatory Visit (INDEPENDENT_AMBULATORY_CARE_PROVIDER_SITE_OTHER): Payer: Medicare Other | Admitting: Cardiology

## 2017-08-16 VITALS — BP 142/78 | HR 80 | Ht 63.0 in | Wt 146.2 lb

## 2017-08-16 DIAGNOSIS — I5032 Chronic diastolic (congestive) heart failure: Secondary | ICD-10-CM

## 2017-08-16 DIAGNOSIS — E785 Hyperlipidemia, unspecified: Secondary | ICD-10-CM

## 2017-08-16 DIAGNOSIS — I209 Angina pectoris, unspecified: Secondary | ICD-10-CM | POA: Diagnosis not present

## 2017-08-16 DIAGNOSIS — R0609 Other forms of dyspnea: Secondary | ICD-10-CM | POA: Diagnosis not present

## 2017-08-16 DIAGNOSIS — R079 Chest pain, unspecified: Secondary | ICD-10-CM | POA: Diagnosis not present

## 2017-08-16 DIAGNOSIS — I25119 Atherosclerotic heart disease of native coronary artery with unspecified angina pectoris: Secondary | ICD-10-CM

## 2017-08-16 DIAGNOSIS — R0600 Dyspnea, unspecified: Secondary | ICD-10-CM
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
## 2017-08-16 NOTE — Patient Instructions (Addendum)
MEDICATION  INSTRUCTIONS  TAKE ALEVE-- YOU CAN PURCHASE OVER THE COUNTER.  220 MG  2 TABLETS  TWICE A DAY FOR 3 DAYS,   THEN 1 TABLET TWICE A DAY FOR 3 DAYS, THEN 1 TABLET ONCE A DAY FOR 3 DAYS THEN STOP.   CALL OFFICE  ( [phone]) Next Wednesday or Thursday  To let us know how you are feeling    Your physician recommends that you schedule a follow-up appointment in 3 months with Dr Ellyn Hack.   If you need a refill on your cardiac medications before your next appointment, please call your pharmacy.

## 2017-08-19 DIAGNOSIS — L309 Dermatitis, unspecified: Secondary | ICD-10-CM | POA: Diagnosis not present

## 2017-08-19 DIAGNOSIS — K219 Gastro-esophageal reflux disease without esophagitis: Secondary | ICD-10-CM | POA: Diagnosis not present

## 2017-08-19 DIAGNOSIS — R399 Unspecified symptoms and signs involving the genitourinary system: Secondary | ICD-10-CM | POA: Diagnosis not present

## 2017-08-19 DIAGNOSIS — Z09 Encounter for follow-up examination after completed treatment for conditions other than malignant neoplasm: Secondary | ICD-10-CM | POA: Diagnosis not present

## 2017-08-19 DIAGNOSIS — E118 Type 2 diabetes mellitus with unspecified complications: Secondary | ICD-10-CM | POA: Diagnosis not present

## 2017-08-23 DIAGNOSIS — R079 Chest pain, unspecified: Secondary | ICD-10-CM | POA: Diagnosis not present

## 2017-08-23 DIAGNOSIS — E118 Type 2 diabetes mellitus with unspecified complications: Secondary | ICD-10-CM | POA: Diagnosis not present

## 2017-08-23 DIAGNOSIS — I10 Essential (primary) hypertension: Secondary | ICD-10-CM | POA: Diagnosis not present

## 2017-08-23 DIAGNOSIS — N3000 Acute cystitis without hematuria: Secondary | ICD-10-CM | POA: Diagnosis not present

## 2017-08-24 ENCOUNTER — Encounter: Payer: Self-pay | Admitting: Cardiology

## 2017-08-24 NOTE — Assessment & Plan Note (Signed)
Relatively normal labs back in June on current dose of rosuvastatin  In Zetia.  At thispoint think we can continue current management

## 2017-08-24 NOTE — Assessment & Plan Note (Signed)
Stable dyspnea. I think as she starts getting more active symptoms were improved. I recommended continued walking As a reliable exercise program.

## 2017-08-24 NOTE — Assessment & Plan Note (Signed)
Negative Myoview and nonexertional chest pain now. Cannot exclude microvascular disease and she is therefore on Imdur. However for now I think is most likely more musculoskeletal -  Continue active exercise.   We will use an Aleve taper

## 2017-08-24 NOTE — Assessment & Plan Note (Signed)
No active heart face and his. No PND or orthopnea. December chest pain could be related to this, but with her current blood pressures unlikely. For now continue carvedilol and Imdur.

## 2017-08-24 NOTE — Assessment & Plan Note (Signed)
She had Yet again another hospitalization for chest pain this time a negative Myoview for ischemia. Symptoms to me  Reproducible on exam and likely musculoskeletal in nature this time around. We will simply do an Aleve taper  As noted in place instructions.    otherwise continue carvedilol and Imdur along with statin and Plavix.

## 2017-09-20 DIAGNOSIS — I10 Essential (primary) hypertension: Secondary | ICD-10-CM | POA: Diagnosis not present

## 2017-09-20 DIAGNOSIS — M79642 Pain in left hand: Secondary | ICD-10-CM | POA: Diagnosis not present

## 2017-09-20 DIAGNOSIS — F419 Anxiety disorder, unspecified: Secondary | ICD-10-CM | POA: Diagnosis not present

## 2017-09-20 DIAGNOSIS — Z5181 Encounter for therapeutic drug level monitoring: Secondary | ICD-10-CM | POA: Diagnosis not present

## 2017-09-20 DIAGNOSIS — E119 Type 2 diabetes mellitus without complications: Secondary | ICD-10-CM | POA: Diagnosis not present

## 2017-09-20 DIAGNOSIS — M79641 Pain in right hand: Secondary | ICD-10-CM | POA: Diagnosis not present

## 2017-09-20 DIAGNOSIS — Z79899 Other long term (current) drug therapy: Secondary | ICD-10-CM | POA: Diagnosis not present

## 2017-09-20 DIAGNOSIS — Z23 Encounter for immunization: Secondary | ICD-10-CM | POA: Diagnosis not present

## 2017-09-20 DIAGNOSIS — R300 Dysuria: Secondary | ICD-10-CM | POA: Diagnosis not present

## 2017-09-22 DIAGNOSIS — M79673 Pain in unspecified foot: Secondary | ICD-10-CM | POA: Diagnosis not present

## 2017-09-22 DIAGNOSIS — M19041 Primary osteoarthritis, right hand: Secondary | ICD-10-CM | POA: Diagnosis not present

## 2017-09-22 DIAGNOSIS — M19042 Primary osteoarthritis, left hand: Secondary | ICD-10-CM | POA: Diagnosis not present

## 2017-09-22 DIAGNOSIS — M79642 Pain in left hand: Secondary | ICD-10-CM | POA: Diagnosis not present

## 2017-09-22 DIAGNOSIS — M79641 Pain in right hand: Secondary | ICD-10-CM | POA: Diagnosis not present

## 2017-09-22 DIAGNOSIS — M25561 Pain in right knee: Secondary | ICD-10-CM | POA: Diagnosis not present

## 2017-09-22 DIAGNOSIS — M25562 Pain in left knee: Secondary | ICD-10-CM | POA: Diagnosis not present

## 2017-09-22 DIAGNOSIS — M25569 Pain in unspecified knee: Secondary | ICD-10-CM | POA: Diagnosis not present

## 2017-09-22 DIAGNOSIS — M7989 Other specified soft tissue disorders: Secondary | ICD-10-CM | POA: Diagnosis not present

## 2017-09-22 DIAGNOSIS — M1990 Unspecified osteoarthritis, unspecified site: Secondary | ICD-10-CM | POA: Diagnosis not present

## 2017-09-22 DIAGNOSIS — M179 Osteoarthritis of knee, unspecified: Secondary | ICD-10-CM | POA: Diagnosis not present

## 2017-09-22 DIAGNOSIS — M79643 Pain in unspecified hand: Secondary | ICD-10-CM | POA: Diagnosis not present

## 2017-10-11 DIAGNOSIS — R7611 Nonspecific reaction to tuberculin skin test without active tuberculosis: Secondary | ICD-10-CM | POA: Diagnosis not present

## 2017-10-13 ENCOUNTER — Telehealth: Payer: Self-pay

## 2017-10-13 ENCOUNTER — Encounter: Payer: Self-pay | Admitting: Internal Medicine

## 2017-10-13 ENCOUNTER — Ambulatory Visit (INDEPENDENT_AMBULATORY_CARE_PROVIDER_SITE_OTHER): Payer: Medicare Other | Admitting: Internal Medicine

## 2017-10-13 DIAGNOSIS — M1990 Unspecified osteoarthritis, unspecified site: Secondary | ICD-10-CM | POA: Insufficient documentation

## 2017-10-13 DIAGNOSIS — I209 Angina pectoris, unspecified: Secondary | ICD-10-CM | POA: Diagnosis not present

## 2017-10-13 DIAGNOSIS — R7611 Nonspecific reaction to tuberculin skin test without active tuberculosis: Secondary | ICD-10-CM | POA: Diagnosis not present

## 2017-10-13 DIAGNOSIS — Z227 Latent tuberculosis: Secondary | ICD-10-CM

## 2017-10-13 HISTORY — DX: Unspecified osteoarthritis, unspecified site: M19.90

## 2017-10-13 LAB — COMPREHENSIVE METABOLIC PANEL
AG Ratio: 1.5 (calc) (ref 1.0–2.5)
ALKALINE PHOSPHATASE (APISO): 43 U/L (ref 33–130)
ALT: 36 U/L — ABNORMAL HIGH (ref 6–29)
AST: 31 U/L (ref 10–35)
Albumin: 3.8 g/dL (ref 3.6–5.1)
BILIRUBIN TOTAL: 0.4 mg/dL (ref 0.2–1.2)
BUN/Creatinine Ratio: 22 (calc) (ref 6–22)
BUN: 22 mg/dL (ref 7–25)
CALCIUM: 9.1 mg/dL (ref 8.6–10.4)
CHLORIDE: 105 mmol/L (ref 98–110)
CO2: 27 mmol/L (ref 20–32)
CREATININE: 1.02 mg/dL — AB (ref 0.60–0.88)
GLOBULIN: 2.6 g/dL (ref 1.9–3.7)
Glucose, Bld: 159 mg/dL — ABNORMAL HIGH (ref 65–99)
Potassium: 4 mmol/L (ref 3.5–5.3)
Sodium: 140 mmol/L (ref 135–146)
Total Protein: 6.4 g/dL (ref 6.1–8.1)

## 2017-10-13 MED ORDER — VITAMIN B-6 50 MG PO TABS: 50.0000 mg | ORAL_TABLET | Freq: Every day | ORAL | 8 refills | Status: DC

## 2017-10-13 MED ORDER — ISONIAZID 300 MG PO TABS: 300.0000 mg | ORAL_TABLET | Freq: Every day | ORAL | 8 refills | Status: DC

## 2017-10-13 NOTE — Telephone Encounter (Signed)
Per [name]'s request this nurse called to request chest xray results be faxed to this office. Spoke with [name] at [facility] who stated she will fax them right over. [name]

## 2017-10-13 NOTE — Progress Notes (Signed)
[facility]  Reason for Consult: Positive QuantiFERON gold TB assay  Referring Physician: Dr. 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 She has no clinical evidence of pneumonia and a chest x-ray done yesterday did not show any active cardiopulmonary disease. It is unclear to me if she will need immunosuppressive therapy for inflammatory arthritis going forward. I talked to her about possible treatment regimens for latent tuberculosis. She takes proton pump inhibitor on a daily basis for chronic acid indigestion which makes rifampin based regimens nonideal due to the drug drug interaction between rifampin and PPIs. I talked her about a 9 month daily regimen of isoniazid. She is in agreement with that plan.   Plan: 1. Check liver enzymes today 2. Start isoniazid 300 mg daily along with pyridoxine 50 mg daily 3. I have warned her about potential side effects 4. She will follow up here in 4 weeks   Patient Active Problem List   Diagnosis Date Noted  . Latent tuberculosis by blood test 10/13/2017    Priority: High  . Inflammatory arthritis 10/13/2017    Priority: Medium  . Chest pain with high risk for cardiac etiology 08/11/2017  . Chronic diastolic CHF (congestive heart failure), NYHA class 2 ([place]) 08/11/2017  . Fatigue due to treatment 01/09/2017  . NSTEMI (non-ST elevated myocardial infarction) ([place]) 11/01/2016  . Diabetes mellitus ([place]) 10/24/2015  . Non-insulin dependent type 2 diabetes mellitus ([place]) 10/24/2015  . Angina, class I ([place]) 04/22/2015  . DOE (dyspnea on exertion) 04/22/2015  . Leg edema, left 04/22/2015  . Accumulation of fluid in tissues 03/21/2014  . Vertigo, central 01/02/2014  . Vertigo 01/02/2014  . Limb pain- echymosis, pain Lt radial artery after cath 12/11/2013    . Chest pain with low risk for cardiac etiology- patent grafts on cath 11/26/13; ? microvascular ischemia 11/28/2013  . Dyslipidemia, goal LDL below 70 05/17/2013  . Accelerated hypertension with diastolic congestive heart failure, NYHA class 3 ([place]) 05/17/2013  . Claudication ([place]) 05/16/2013  . PTA/ Stent Rt popliteal and Rt peroneal artery 05/16/13 05/16/2013  . PAD,severe calcified pop and tibial peroneal trunk disease bilateraly   . ABDOMINAL PAIN RIGHT LOWER QUADRANT 10/07/2010  . CVA-STROKE 03/04/2010  . BARRETT'S ESOPHAGUS 03/04/2010  . FECAL INCONTINENCE 03/04/2010  . DM 01/14/2010  . ANEMIA OF CHRONIC DISEASE 01/14/2010  . Atherosclerotic heart disease of native coronary artery with angina pectoris ([place]) 01/14/2010  . DIVERTICULOSIS OF COLON 01/14/2010  . PERSONAL HISTORY OF COLONIC POLYPS 01/14/2010  . SHELLFISH ALLERGY 01/14/2010    Patient's Medications  New Prescriptions   ISONIAZID (NYDRAZID) 300 MG TABLET    Take 1 tablet (300 mg total) by mouth daily.   PYRIDOXINE (VITAMIN B-6) 50 MG TABLET    Take 1 tablet (50 mg total) by mouth daily.  Previous Medications   CARVEDILOL (COREG) 3.125 MG TABLET    Take 1 tablet (3.125 mg total) by mouth 2 (two) times daily.   CLOPIDOGREL (PLAVIX) 75 MG TABLET    Take 75 mg by mouth daily.   COENZYME Q10 (CO Q 10 PO)    Take 1 tablet by mouth at bedtime.    EZETIMIBE (ZETIA) 10 MG TABLET    Take 10 mg by mouth daily.   GABAPENTIN (NEURONTIN) 300 MG CAPSULE    Take  1 capsule (300 mg total) by mouth 3 (three) times daily.   ISOSORBIDE MONONITRATE (IMDUR) 60 MG 24 HR TABLET    Take 60 mg by mouth daily.   NAPROXEN SODIUM (ANAPROX) 220 MG TABLET    Take 440 mg by mouth 2 (two) times daily as needed.   NITROGLYCERIN (NITROSTAT) 0.4 MG SL TABLET    Place 0.4 mg under the tongue every 5 (five) minutes as needed for chest pain.   PANTOPRAZOLE (PROTONIX) 40 MG TABLET    Take 1 tablet (40 mg total) by mouth 2 (two) times daily before a meal.    ROSUVASTATIN (CRESTOR) 40 MG TABLET    Take 40 mg by mouth daily.   TRULICITY 1.5 BS/4.9QP SOPN    Inject 1.5 mg into the skin once a week.  Modified Medications   No medications on file  Discontinued Medications   No medications on file    HPI: [name] is a 83 y.o. female who is referred for evaluation of a recent positive QuantiFERON gold TB assay. She recently developed bilateral hand pain with mild swelling. She was referred by her primary care provider Dr. Jani Gravel, to see Dr. Kathlene November on 09/22/2017. He noted that she had mild synovitis in her hands and diagnosed probable inflammatory arthritis superimposed upon chronic osteoarthritis. He did baseline blood work and started her on prednisone. She took the prednisone for about 2 weeks but did not note any improvement in the pain and swelling in her hands. Apparently when her QuantiFERON assay returned positive someone called her and told her to stop the prednisone.  She does not know of any previous exposures to anyone with active tuberculosis. She was born and raised in [place] and has never lived anywhere else. She has had a variety of jobs throughout her lifetime. 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 She is not sure if she ever had a TB skin test. 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 She has had never had pneumonia that she knows of.  Review of Systems: Review of Systems  Constitutional: Positive for malaise/fatigue. Negative for chills, diaphoresis, fever and weight loss.  HENT: Negative for congestion and sore throat.   Respiratory: Negative for cough, sputum production and shortness of breath.   Cardiovascular: Negative for chest pain.  Gastrointestinal: Positive for heartburn. Negative for abdominal pain, diarrhea, nausea and vomiting.  Genitourinary: Positive for  dysuria. Negative for frequency.  Musculoskeletal: Positive for joint pain. Negative for myalgias.  Skin: Negative for rash.  Neurological: Negative for dizziness and headaches.      Past Medical History:  Diagnosis Date  . Atherosclerotic heart disease native coronary artery w/angina pectoris ([place]) 1992   a. 1992 s/p PTCA of LAD;  b. 1998 CABG x 3; c. 07/2001 Cath: Sev LM/LAD/LCX dzs, 3/3 patent grafts; d. 11/2013 Cath: stable graft anatomy; e. 10/2016 Cath: native 3VD, VG->OM nl, LIMA->LAD nl, VG->Diag 55.  . Bradycardia, mild briefly to the 40s, asymptomatic 05/16/2013  . Carotid arterial disease ([place])    a. 05/2014 Carotid U/S: No signif bilat ICA stenosis, >60% L ECA.  Marland Kitchen Chronic anemia   . Chronic diastolic CHF (congestive heart failure) ([place])    a. 10/2016 Echo: Ef 55-60%, no rwma, Gr1 DD, triv AI, PASP 24mmHg, atrial septal aneurysm.  Marland Kitchen  Chronic diastolic CHF (congestive heart failure), NYHA class 2 ([place]) 08/11/2017  . DM (diabetes mellitus), type 2 with peripheral vascular complications (H[facility])    On Oral medications.  . Essential hypertension   . GERD (gastroesophageal reflux disease)   . Hyperlipidemia with target LDL less than 70   . PAD (peripheral artery disease) ([place]) 05/2013; 09/2013   a. severe calcified POP-1 & TP Trunk Dz bilat;  b . 05/2013 Diamondback Rot Athrectomy (R POP) --> PTA R Pop, DES to R Peroneal;  c. 09/2013 PTA/Stenting of L Pop [name] - retrograde access);  d. 04/2014 ABI's: R/L: 1.1/1.1.  Marland Kitchen Peptic ulcer disease   . S/P CABG x 3 1998   LIMA-LAD, SVG-OM, SVG-D1  . Severe claudication ([place]) 05/2013   Referred for Peripheral Angio (Dr. Gwenlyn Found --> Dr. Brunetta Jeans in [place])    Social History  Substance Use Topics  . Smoking status: Never Smoker  . Smokeless tobacco: Never Used  . Alcohol use No     Comment: rare glass of wine     Family History  Problem Relation Age of Onset  . Hypertension Mother   . Hyperlipidemia Mother   . Hyperlipidemia Father   .  Hypertension Father    Allergies  Allergen Reactions  . Fish Allergy Hives and Shortness Of Breath  . Ivp Dye [Iodinated Diagnostic Agents] Shortness Of Breath and Anaphylaxis  . Latex Hives, Shortness Of Breath and Itching    REACTION: wheezing  . Shellfish-Derived Products Anaphylaxis and Hives  . Other     Patient reports allergy to perfumed detergents. Causes itching.  . Metformin Nausea Only    OBJECTIVE: Vitals:   10/13/17 1535  BP: 102/68  Pulse: 91  Temp: 99.2 F (37.3 C)  TempSrc: Oral  Weight: 164 lb 3.2 oz (74.5 kg)  Height: 5\' 3"  (1.6 m)   Body mass index is 29.09 kg/m.   Physical Exam  Constitutional: She is oriented to person, place, and time.  She is very pleasant and in no distress other than being somewhat nervous about not knowing why she is here to see me.  HENT:  Mouth/Throat: No oropharyngeal exudate.  Cardiovascular: Normal rate and regular rhythm.   No murmur heard. Pulmonary/Chest: Effort normal and breath sounds normal. She has no wheezes. She has no rales.  Abdominal: Soft. She exhibits no distension. There is no tenderness.  Musculoskeletal: Normal range of motion. She exhibits no edema or tenderness.  Neurological: She is alert and oriented to person, place, and time.  Skin: No rash noted.  Psychiatric: Mood and affect normal.    Microbiology: No results found for this or any previous visit (from the past 240 hour(s)).  Michel Bickers, MD [facility] for [facility] [phone] pager   [phone] cell 10/13/2017, 5:12 PM

## 2017-10-17 DIAGNOSIS — I10 Essential (primary) hypertension: Secondary | ICD-10-CM | POA: Diagnosis not present

## 2017-10-17 DIAGNOSIS — R7611 Nonspecific reaction to tuberculin skin test without active tuberculosis: Secondary | ICD-10-CM | POA: Diagnosis not present

## 2017-10-17 DIAGNOSIS — Z79899 Other long term (current) drug therapy: Secondary | ICD-10-CM | POA: Diagnosis not present

## 2017-10-17 DIAGNOSIS — N3000 Acute cystitis without hematuria: Secondary | ICD-10-CM | POA: Diagnosis not present

## 2017-10-17 DIAGNOSIS — E118 Type 2 diabetes mellitus with unspecified complications: Secondary | ICD-10-CM | POA: Diagnosis not present

## 2017-10-17 DIAGNOSIS — Z5181 Encounter for therapeutic drug level monitoring: Secondary | ICD-10-CM | POA: Diagnosis not present

## 2017-10-20 DIAGNOSIS — E118 Type 2 diabetes mellitus with unspecified complications: Secondary | ICD-10-CM | POA: Diagnosis not present

## 2017-10-24 DIAGNOSIS — M79643 Pain in unspecified hand: Secondary | ICD-10-CM | POA: Diagnosis not present

## 2017-10-24 DIAGNOSIS — M79673 Pain in unspecified foot: Secondary | ICD-10-CM | POA: Diagnosis not present

## 2017-10-24 DIAGNOSIS — M25569 Pain in unspecified knee: Secondary | ICD-10-CM | POA: Diagnosis not present

## 2017-10-24 DIAGNOSIS — R7611 Nonspecific reaction to tuberculin skin test without active tuberculosis: Secondary | ICD-10-CM | POA: Diagnosis not present

## 2017-10-24 DIAGNOSIS — M7989 Other specified soft tissue disorders: Secondary | ICD-10-CM | POA: Diagnosis not present

## 2017-10-24 DIAGNOSIS — M1990 Unspecified osteoarthritis, unspecified site: Secondary | ICD-10-CM | POA: Diagnosis not present

## 2017-11-01 ENCOUNTER — Other Ambulatory Visit: Payer: Self-pay | Admitting: Internal Medicine

## 2017-11-01 ENCOUNTER — Ambulatory Visit
Admission: RE | Admit: 2017-11-01 | Discharge: 2017-11-01 | Disposition: A | Discharge status: Home / Self Care | Payer: Medicare Other | Source: Ambulatory Visit | Attending: Internal Medicine | Admitting: Internal Medicine

## 2017-11-01 DIAGNOSIS — Z79899 Other long term (current) drug therapy: Secondary | ICD-10-CM | POA: Diagnosis not present

## 2017-11-01 DIAGNOSIS — R1031 Right lower quadrant pain: Secondary | ICD-10-CM

## 2017-11-01 DIAGNOSIS — Z5181 Encounter for therapeutic drug level monitoring: Secondary | ICD-10-CM | POA: Diagnosis not present

## 2017-11-01 DIAGNOSIS — R109 Unspecified abdominal pain: Secondary | ICD-10-CM | POA: Diagnosis not present

## 2017-11-10 ENCOUNTER — Encounter: Payer: Self-pay | Admitting: Gastroenterology

## 2017-11-10 ENCOUNTER — Ambulatory Visit: Payer: Medicare Other | Admitting: Internal Medicine

## 2017-11-10 ENCOUNTER — Ambulatory Visit (INDEPENDENT_AMBULATORY_CARE_PROVIDER_SITE_OTHER): Payer: Medicare Other | Admitting: Gastroenterology

## 2017-11-10 VITALS — BP 110/74 | HR 68 | Ht 63.0 in | Wt 162.2 lb

## 2017-11-10 DIAGNOSIS — K625 Hemorrhage of anus and rectum: Secondary | ICD-10-CM | POA: Insufficient documentation

## 2017-11-10 DIAGNOSIS — I209 Angina pectoris, unspecified: Secondary | ICD-10-CM

## 2017-11-10 DIAGNOSIS — K602 Anal fissure, unspecified: Secondary | ICD-10-CM | POA: Insufficient documentation

## 2017-11-10 DIAGNOSIS — K6289 Other specified diseases of anus and rectum: Secondary | ICD-10-CM | POA: Diagnosis not present

## 2017-11-10 MED ORDER — NONFORMULARY OR COMPOUNDED ITEM: 0 refills | Status: DC

## 2017-11-10 NOTE — Patient Instructions (Signed)
If you are age 81 or older, your body mass index should be between 23-30. Your Body mass index is 28.74 kg/m. If this is out of the aforementioned range listed, please consider follow up with your Primary Care Provider.  If you are age 69 or younger, your body mass index should be between 19-25. Your Body mass index is 28.74 kg/m. If this is out of the aformentioned range listed, please consider follow up with your Primary Care Provider.   We have sent the following medications to your pharmacy for you to pick up at your convenience: Nitro Gel  Call back in 3-4 weeks with an update on rectal bleeding and pain.  Ask for Katina Degree, RN.  You have been given Rectal Care instructions.  Thank you for choosing me and [facility].   Alonza Bogus, PA-C

## 2017-11-10 NOTE — Progress Notes (Signed)
11/10/2017 [name] [id number] 1933/07/29   HISTORY OF PRESENT ILLNESS: This is an 83 year old female who was previously known to Dr. Deatra Ina.  She is requesting Dr. Loletha Carrow as her new GI physician.  She has not been here in quite some time.  Her last colonoscopy was in November 2011 at which time she was only found to have diverticulosis and repeat was recommended in 10 years from that time.  She presents to the office today at the request of her PCP, Dr. Maudie Mercury, with complaints of rectal bleeding and rectal pain as well as abdominal pain.  She tells me that her abdominal pain was just below her bellybutton and it came on very suddenly around November 18th.  It was very severe pain, but not did not last long, really no more than an hour at most.  She did end up seeing her PCP and a CT scan of the abdomen and pelvis without contrast (due to allergy) was performed, but showed no cause of her abdominal pain.  Initially her PCP was going to treat her with Cipro and Flagyl for diverticulitis, but then CT scan proved negative and her pain resolved.  She denies any pain now, may be just some slight soreness in that area.  She says that following this abdominal pain a couple of days later she had a moderate amount of blood and mucus per rectum.  She reports rectal pain with bowel movements as well.  She says that she has been moving her bowels just fine with no significant diarrhea or constipation.  Denies nausea, vomiting, fever, chills.  CBC through PCPs office was within normal limits.  Urinalysis fairly unremarkable.  CMP also within normal limits.  Past Medical History:  Diagnosis Date  . Atherosclerotic heart disease native coronary artery w/angina pectoris ([place]) 1992   a. 1992 s/p PTCA of LAD;  b. 1998 CABG x 3; c. 07/2001 Cath: Sev LM/LAD/LCX dzs, 3/3 patent grafts; d. 11/2013 Cath: stable graft anatomy; e. 10/2016 Cath: native 3VD, VG->OM nl, LIMA->LAD nl, VG->Diag 55.  . Bradycardia, mild briefly  to the 40s, asymptomatic 05/16/2013  . Carotid arterial disease ([place])    a. 05/2014 Carotid U/S: No signif bilat ICA stenosis, >60% L ECA.  Marland Kitchen Chronic anemia   . Chronic diastolic CHF (congestive heart failure) ([place])    a. 10/2016 Echo: Ef 55-60%, no rwma, Gr1 DD, triv AI, PASP 43mmHg, atrial septal aneurysm.  . Chronic diastolic CHF (congestive heart failure), NYHA class 2 ([place]) 08/11/2017  . DM (diabetes mellitus), type 2 with peripheral vascular complications ([facility])    On Oral medications.  . Essential hypertension   . GERD (gastroesophageal reflux disease)   . Hyperlipidemia with target LDL less than 70   . PAD (peripheral artery disease) ([place]) 05/2013; 09/2013   a. severe calcified POP-1 & TP Trunk Dz bilat;  b . 05/2013 Diamondback Rot Athrectomy (R POP) --> PTA R Pop, DES to R Peroneal;  c. 09/2013 PTA/Stenting of L Pop [name] - retrograde access);  d. 04/2014 ABI's: R/L: 1.1/1.1.  Marland Kitchen Peptic ulcer disease   . S/P CABG x 3 1998   LIMA-LAD, SVG-OM, SVG-D1  . Severe claudication ([place]) 05/2013   Referred for Peripheral Angio (Dr. Gwenlyn Found --> Dr. Brunetta Jeans in [place])   Past Surgical History:  Procedure Laterality Date  . ATHERECTOMY Right 05/15/2013   Procedure: ATHERECTOMY;  Surgeon: Lorretta Harp, MD;  Location[facility] CATH LAB;  Service: Cardiovascular;  Laterality: Right;  popliteal  . BACK SURGERY    . CARDIAC CATHETERIZATION  07/17/01   2 v CAD with LM, circ, LAD, obtuse diag, mod stenosis of diag vein graft , mild stenosis of marg vein graft, nl EF, medical treatment  . CARDIAC CATHETERIZATION  11/2013   Widely patent LIMA-LAD & SVG-OM with mild progression of proximal stenosis ~40-50% in SVG-D1; Widely patent native RCA with known severe native LCA disease  . CARDIAC CATHETERIZATION N/A 11/01/2016   Procedure: Left Heart Cath and Cors/Grafts Angiography;  Surgeon: [name], MD;  Location: [facility] CV LAB;  Service: Cardiovascular: Hemodynamics: High LVEDP!.  Cors/Grafts: LM 55%,  o-p LAD 100% then after D1 -> LIMA-LAD patent, SVG-Diag ~55%. small RI wiht ~70% ostial. O-p Cx 80% - pCx 70% --> SVG-bifurcating OM patent. -- med Rx  . CORONARY ANGIOPLASTY  1992   PCI to LAD  . CORONARY ARTERY BYPASS GRAFT  1998   LIMA-LAD, SVG-diagonal (none roughly 50% stenosis), SVG-OM  . LEFT HEART CATHETERIZATION WITH CORONARY ANGIOGRAM N/A 11/27/2013   Procedure: LEFT HEART CATHETERIZATION WITH CORONARY ANGIOGRAM;  Surgeon: [name], MD;  Location: [facility] CATH LAB;  Service: Cardiovascular;  Laterality: N/A;  . LOW EXTREMITY DOPPLERS/ABI  10/2016   Patent right SFA, popliteal and peroneal tendons. Patent left SFA and popliteal stent. 30-50% stenosis in the right common femoral and popliteal arteries, 50-74% stenosis in left profunda femoris artery. --> 1 year follow-up.  RABI - 1.2, LABI 1.1  . LOWER EXTREMITY ANGIOGRAM N/A 02/22/2013   Procedure: LOWER EXTREMITY ANGIOGRAM;  Surgeon: Lorretta Harp, MD;  Location: [facility] CATH LAB;  Service: Cardiovascular;  Laterality: N/A;  . LOWER EXTREMITY ANGIOGRAM N/A 07/20/2013   Procedure: LOWER EXTREMITY ANGIOGRAM;  Surgeon: Lorretta Harp, MD;  Location: [facility] CATH LAB;  Service: Cardiovascular;  Laterality: N/A;  . NM MYOVIEW LTD  04/03/2013; 5/26'/2016   Lexiscan: a)  Apical perfusion defect with no ischemia. Consider prior infarct versus breast attenuation.;; b) 5/'16: LOW RISK, Nl EF ~55-65%, No Ischemia /Infarction  . NM MYOVIEW LTD  08/12/2017   Multiple low RISK. Normal study. No EKG changes. 1.5 mm T wave inversions noted in V2-V4 that began 1 minute into stress. -Ended 3 minutes into rest.  EF 55-65%  . PERCUTANEOUS STENT INTERVENTION Right 05/15/2013   Procedure: PERCUTANEOUS STENT INTERVENTION;  Surgeon: Lorretta Harp, MD;  Location: [facility] CATH LAB;  Service: Cardiovascular;  Laterality: Right;  tibioperoneal trunk  . Peroneal Artery Stent  05/15/13   Diamondback rot. atherectomy of high grade segmental popliteal stenosis then Chocolate  balloonPTA; then angiosculpt PTA of the prox peroneal with stent-Xpedition   . pv angio  07/20/13   high-grade calcified popliteal disease with one-vessel runoff via a small anterior tibial artery that has proximal disease as well, no intervention  . TRANSTHORACIC ECHOCARDIOGRAM  10/2016   EF 55-60%. Gr 1 DD. Normal PAP. Aortic Sclerosis.  . TRANSTHORACIC ECHOCARDIOGRAM  02/2011; 04/2015   a) EF 50-55%, mild inf HK;; b)  Nl LV Size & Fxn EF 60-65%. mild LVH, Gr 1 DD.  . TUBAL LIGATION      reports that  has never smoked. she has never used smokeless tobacco. She reports that she does not drink alcohol or use drugs. family history includes Hyperlipidemia in her father and mother; Hypertension in her father and mother. Allergies  Allergen Reactions  . Fish Allergy Hives and Shortness Of Breath  . Ivp Dye [Iodinated Diagnostic Agents] Shortness Of Breath and Anaphylaxis  .  Latex Hives, Shortness Of Breath and Itching    REACTION: wheezing  . Shellfish-Derived Products Anaphylaxis and Hives  . Other     Patient reports allergy to perfumed detergents. Causes itching.  . Metformin Nausea Only      Outpatient Encounter Medications as of 11/10/2017  Medication Sig  . clopidogrel (PLAVIX) 75 MG tablet Take 75 mg by mouth daily.  . Coenzyme Q10 (CO Q 10 PO) Take 1 tablet by mouth at bedtime.   Marland Kitchen ezetimibe (ZETIA) 10 MG tablet Take 10 mg by mouth daily.  Marland Kitchen gabapentin (NEURONTIN) 300 MG capsule Take 1 capsule (300 mg total) by mouth 3 (three) times daily.  Marland Kitchen isoniazid (NYDRAZID) 300 MG tablet Take 1 tablet (300 mg total) by mouth daily.  . isosorbide mononitrate (IMDUR) 60 MG 24 hr tablet Take 60 mg by mouth daily.  . naproxen sodium (ANAPROX) 220 MG tablet Take 440 mg by mouth 2 (two) times daily as needed.  . nitroGLYCERIN (NITROSTAT) 0.4 MG SL tablet Place 0.4 mg under the tongue every 5 (five) minutes as needed for chest pain.  . pantoprazole (PROTONIX) 40 MG tablet Take 1 tablet (40 mg total)  by mouth 2 (two) times daily before a meal.  . pyridOXINE (VITAMIN B-6) 50 MG tablet Take 1 tablet (50 mg total) by mouth daily.  . rosuvastatin (CRESTOR) 40 MG tablet Take 40 mg by mouth daily.  . TRULICITY 1.5 TF/5.7DU SOPN Inject 1.5 mg into the skin once a week.  . carvedilol (COREG) 3.125 MG tablet Take 1 tablet (3.125 mg total) by mouth 2 (two) times daily.   No facility-administered encounter medications on file as of 11/10/2017.      REVIEW OF SYSTEMS  : All other systems reviewed and negative except where noted in the History of Present Illness.   PHYSICAL EXAM: BP 110/74   Pulse 68   Ht 5\' 3"  (1.6 m)   Wt 162 lb 4 oz (73.6 kg)   BMI 28.74 kg/m  General: Well developed black female in no acute distress Head: Normocephalic and atraumatic Eyes:  Sclerae anicteric, conjunctiva pink. Ears: Normal auditory acuity Lungs: Clear throughout to auscultation; no increased WOB. Heart: Regular rate and rhythm; no M/R/G. Abdomen: Soft, non-distended.  BS present.  Minimal TTP in just below her umbilicus.  Scar noted on lower abdomen from previous surgery Rectal:  Posterior anal fissure noted that was tender on exam.  No other external abnormalities seen.  DRE revealed some soft stool in the rectal vault but no masses.  Light brown stool on exam glove that was not hemocculted.  Anoscopy not performed. Musculoskeletal: Symmetrical with no gross deformities  Skin: No lesions on visible extremities Extremities: No edema  Neurological: Alert oriented x 4, grossly non-focal Psychological:  Alert and cooperative. Normal mood and affect  ASSESSMENT AND PLAN: *Rectal bleeding and pain:  Likely due to the fissure that was seen ion exam today.  Will treat with nitroglycerin gel 0.125% BID for 8-10 weeks.  Rectal care instructions reviewed including avoiding aggressive wiping, etc.  Need to keep bowels soft.  Will call back in 3-4 weeks on update regarding symptoms associated with the anal  fissure. *Suprapubic abdominal pain:  Was short-lasting and severe but now resolved.  CT scan was negative.  Her pain location was over the area of a previous scar so ? If it was associated with some adhesions/scar tissue.  Observe for now.   CC:  Dr. Maudie Mercury

## 2017-11-11 NOTE — Progress Notes (Signed)
Thank you for sending this case to me. I have reviewed the entire note, and the outlined plan seems appropriate.  Agree difficult to tell what this pain was from.  Lack of chronicity seems to speak against chronic mesenteric ischemia.  Please make sure this patient follows up with [place] Wilfrid Lund, MD

## 2017-11-14 ENCOUNTER — Ambulatory Visit (INDEPENDENT_AMBULATORY_CARE_PROVIDER_SITE_OTHER): Payer: Medicare Other | Admitting: Internal Medicine

## 2017-11-14 DIAGNOSIS — R7611 Nonspecific reaction to tuberculin skin test without active tuberculosis: Secondary | ICD-10-CM

## 2017-11-14 DIAGNOSIS — Z227 Latent tuberculosis: Secondary | ICD-10-CM

## 2017-11-14 DIAGNOSIS — I209 Angina pectoris, unspecified: Secondary | ICD-10-CM | POA: Diagnosis not present

## 2017-11-14 NOTE — Progress Notes (Signed)
[facility] for Infectious Disease  Patient Active Problem List   Diagnosis Date Noted  . Latent tuberculosis by blood test 10/13/2017    Priority: High  . Inflammatory arthritis 10/13/2017    Priority: Medium  . Anal fissure 11/10/2017  . Rectal bleeding 11/10/2017  . Rectal pain 11/10/2017  . Chest pain with high risk for cardiac etiology 08/11/2017  . Chronic diastolic CHF (congestive heart failure), NYHA class 2 ([place]) 08/11/2017  . Fatigue due to treatment 01/09/2017  . NSTEMI (non-ST elevated myocardial infarction) ([place]) 11/01/2016  . Diabetes mellitus ([place]) 10/24/2015  . Non-insulin dependent type 2 diabetes mellitus ([place]) 10/24/2015  . Angina, class I ([place]) 04/22/2015  . DOE (dyspnea on exertion) 04/22/2015  . Leg edema, left 04/22/2015  . Accumulation of fluid in tissues 03/21/2014  . Vertigo, central 01/02/2014  . Vertigo 01/02/2014  . Limb pain- echymosis, pain Lt radial artery after cath 12/11/2013  . Chest pain with low risk for cardiac etiology- patent grafts on cath 11/26/13; ? microvascular ischemia 11/28/2013  . Dyslipidemia, goal LDL below 70 05/17/2013  . Accelerated hypertension with diastolic congestive heart failure, NYHA class 3 ([place]) 05/17/2013  . Claudication ([place]) 05/16/2013  . PTA/ Stent Rt popliteal and Rt peroneal artery 05/16/13 05/16/2013  . PAD,severe calcified pop and tibial peroneal trunk disease bilateraly   . ABDOMINAL PAIN RIGHT LOWER QUADRANT 10/07/2010  . CVA-STROKE 03/04/2010  . BARRETT'S ESOPHAGUS 03/04/2010  . FECAL INCONTINENCE 03/04/2010  . DM 01/14/2010  . ANEMIA OF CHRONIC DISEASE 01/14/2010  . Atherosclerotic heart disease of native coronary artery with angina pectoris ([place]) 01/14/2010  . DIVERTICULOSIS OF COLON 01/14/2010  . PERSONAL HISTORY OF COLONIC POLYPS 01/14/2010  . [place] ALLERGY 01/14/2010      Medication List        Accurate as of 11/14/17  2:22 PM. Always use your most recent med list.            carvedilol 3.125 MG tablet Commonly known as:  COREG Take 1 tablet (3.125 mg total) by mouth 2 (two) times daily.   CO Q 10 PO   ezetimibe 10 MG tablet Commonly known as:  ZETIA   gabapentin 300 MG capsule Commonly known as:  NEURONTIN Take 1 capsule (300 mg total) by mouth 3 (three) times daily.   isoniazid 300 MG tablet Commonly known as:  NYDRAZID Take 1 tablet (300 mg total) by mouth daily.   isosorbide mononitrate 60 MG 24 hr tablet Commonly known as:  IMDUR   naproxen sodium 220 MG tablet Commonly known as:  ALEVE   nitroGLYCERIN 0.4 MG SL tablet Commonly known as:  NITROSTAT   NONFORMULARY OR COMPOUNDED ITEM Nitro gel 0.125 % bid x 6-8 weeks.   pantoprazole 40 MG tablet Commonly known as:  PROTONIX Take 1 tablet (40 mg total) by mouth 2 (two) times daily before a meal.   PLAVIX 75 MG tablet Generic drug:  clopidogrel   pyridOXINE 50 MG tablet Commonly known as:  VITAMIN B-6 Take 1 tablet (50 mg total) by mouth daily.   rosuvastatin 40 MG tablet Commonly known as:  CRESTOR   TRULICITY 1.5 XB/3.5HG Sopn Generic drug:  Dulaglutide       Subjective: Ms. Roper is in for her initial follow-up visit. She was able to start isoniazid and pyridoxine one month ago. She does not believe that she had any side effects. She completed her first months bottle several days ago and has not  had it refilled yet. She says that she has occasional nausea at baseline even before starting isoniazid. That has not worsened but she did have an episode of severe lower abdominal pain on 10/30/2017 associated with rectal bleeding.Abdominal CT scan did not show any acute abnormalities. She was referred to gastroenterology who treated her for a rectal fissure. She has not had any more pain or bleeding since that time. She says she is under a great deal of stress as her husband is ill and she is his primary caregiver. She is having more pain and stiffness in her hands. She is due to go  back and see her rheumatologist soon. She believes that she is taking prednisone but is not sure how much.  Review of Systems: Review of Systems  Constitutional: Negative for chills, diaphoresis, fever and weight loss.  Respiratory: Negative for cough, hemoptysis, sputum production and shortness of breath.   Cardiovascular: Negative for chest pain.  Gastrointestinal: Positive for abdominal pain, blood in stool and nausea. Negative for constipation, diarrhea and vomiting.  Musculoskeletal: Positive for joint pain.  Skin: Negative for rash.    Past Medical History:  Diagnosis Date  . Atherosclerotic heart disease native coronary artery w/angina pectoris ([place]) 1992   a. 1992 s/p PTCA of LAD;  b. 1998 CABG x 3; c. 07/2001 Cath: Sev LM/LAD/LCX dzs, 3/3 patent grafts; d. 11/2013 Cath: stable graft anatomy; e. 10/2016 Cath: native 3VD, VG->OM nl, LIMA->LAD nl, VG->Diag 55.  . Bradycardia, mild briefly to the 40s, asymptomatic 05/16/2013  . Carotid arterial disease ([place])    a. 05/2014 Carotid U/S: No signif bilat ICA stenosis, >60% L ECA.  Marland Kitchen Chronic anemia   . Chronic diastolic CHF (congestive heart failure) ([place])    a. 10/2016 Echo: Ef 55-60%, no rwma, Gr1 DD, triv AI, PASP 30mmHg, atrial septal aneurysm.  . Chronic diastolic CHF (congestive heart failure), NYHA class 2 ([place]) 08/11/2017  . DM (diabetes mellitus), type 2 with peripheral vascular complications (HCC)    On Oral medications.  . Essential hypertension   . GERD (gastroesophageal reflux disease)   . Hyperlipidemia with target LDL less than 70   . PAD (peripheral artery disease) ([place]) 05/2013; 09/2013   a. severe calcified POP-1 & TP Trunk Dz bilat;  b . 05/2013 Diamondback Rot Athrectomy (R POP) --> PTA R Pop, DES to R Peroneal;  c. 09/2013 PTA/Stenting of L Pop [name] - retrograde access);  d. 04/2014 ABI's: R/L: 1.1/1.1.  Marland Kitchen Peptic ulcer disease   . S/P CABG x 3 1998   LIMA-LAD, SVG-OM, SVG-D1  . Severe claudication ([place]) 05/2013    Referred for Peripheral Angio (Dr. Gwenlyn Found --> Dr. Brunetta Jeans in [place])    Social History   Tobacco Use  . Smoking status: Never Smoker  . Smokeless tobacco: Never Used  Substance Use Topics  . Alcohol use: No    Comment: rare glass of wine   . Drug use: No    Family History  Problem Relation Age of Onset  . Hypertension Mother   . Hyperlipidemia Mother   . Hyperlipidemia Father   . Hypertension Father     Allergies  Allergen Reactions  . Fish Allergy Hives and Shortness Of Breath  . Ivp Dye [Iodinated Diagnostic Agents] Shortness Of Breath and Anaphylaxis  . Latex Hives, Shortness Of Breath and Itching    REACTION: wheezing  . Shellfish-Derived Products Anaphylaxis and Hives  . Other     Patient reports allergy to perfumed detergents. Causes  itching.  . Metformin Nausea Only    Objective: Vitals:   11/14/17 1358  BP: (!) 150/77  Pulse: 80  Weight: 163 lb (73.9 kg)   Body mass index is 28.87 kg/m.  Physical Exam  Constitutional: She is oriented to person, place, and time.  She is in good spirits. Her weight is unchanged.  Cardiovascular: Normal rate and regular rhythm.  No murmur heard. Pulmonary/Chest: Effort normal and breath sounds normal. She has no wheezes. She has no rales.  Abdominal: Soft. She exhibits no distension and no mass. There is no tenderness.  Neurological: She is alert and oriented to person, place, and time.  Psychiatric: Mood and affect normal.    Lab Results    Problem List Items Addressed This Visit      High   Latent tuberculosis by blood test    She seems to be off to a good tart on isoniazid therapy for latent tuberculosis. I doubt that her transient abdominal pain and rectal bleeding had anything to do with isoniazid. I will recheck her liver enzymes today. They were normal one month ago before starting isoniazid. I've asked her to get the isoniazid and pyridoxine refilled and plan on taking both for 8 more months. She knows to  call me if she develops any increased nausea, vomiting, abdominal pain or jaundice. She will follow-up here in 2 months.      Relevant Orders   Comprehensive metabolic panel       Michel Bickers, MD [facility] for [facility] [phone] pager   [phone] cell 11/14/2017, 2:22 PM

## 2017-11-14 NOTE — Assessment & Plan Note (Addendum)
She seems to be off to a good tart on isoniazid therapy for latent tuberculosis. I doubt that her transient abdominal pain and rectal bleeding had anything to do with isoniazid. I will recheck her liver enzymes today. They were normal one month ago before starting isoniazid. I've asked her to get the isoniazid and pyridoxine refilled and plan on taking both for 8 more months. She knows to call me if she develops any increased nausea, vomiting, abdominal pain or jaundice. We did check with her pharmacy and she is taking prednisone 5 mg daily. She will follow-up here in 2 months.

## 2017-11-15 ENCOUNTER — Ambulatory Visit: Payer: Medicare Other | Admitting: Cardiology

## 2017-11-15 DIAGNOSIS — R109 Unspecified abdominal pain: Secondary | ICD-10-CM | POA: Diagnosis not present

## 2017-11-15 DIAGNOSIS — Z79899 Other long term (current) drug therapy: Secondary | ICD-10-CM | POA: Diagnosis not present

## 2017-11-15 DIAGNOSIS — E7800 Pure hypercholesterolemia, unspecified: Secondary | ICD-10-CM | POA: Diagnosis not present

## 2017-11-15 DIAGNOSIS — F419 Anxiety disorder, unspecified: Secondary | ICD-10-CM | POA: Diagnosis not present

## 2017-11-15 LAB — COMPREHENSIVE METABOLIC PANEL
AG RATIO: 1.3 (calc) (ref 1.0–2.5)
ALBUMIN MSPROF: 3.8 g/dL (ref 3.6–5.1)
ALT: 21 U/L (ref 6–29)
AST: 20 U/L (ref 10–35)
Alkaline phosphatase (APISO): 45 U/L (ref 33–130)
BILIRUBIN TOTAL: 0.3 mg/dL (ref 0.2–1.2)
BUN/Creatinine Ratio: 16 (calc) (ref 6–22)
BUN: 19 mg/dL (ref 7–25)
CHLORIDE: 105 mmol/L (ref 98–110)
CO2: 30 mmol/L (ref 20–32)
CREATININE: 1.21 mg/dL — AB (ref 0.60–0.88)
Calcium: 9.3 mg/dL (ref 8.6–10.4)
GLOBULIN: 2.9 g/dL (ref 1.9–3.7)
Glucose, Bld: 152 mg/dL — ABNORMAL HIGH (ref 65–99)
POTASSIUM: 4.1 mmol/L (ref 3.5–5.3)
Sodium: 142 mmol/L (ref 135–146)
TOTAL PROTEIN: 6.7 g/dL (ref 6.1–8.1)

## 2017-11-21 ENCOUNTER — Ambulatory Visit: Payer: Medicare Other | Admitting: Cardiology

## 2017-11-22 ENCOUNTER — Ambulatory Visit: Payer: Medicare Other | Admitting: Cardiology

## 2017-12-12 DIAGNOSIS — M79673 Pain in unspecified foot: Secondary | ICD-10-CM | POA: Diagnosis not present

## 2017-12-12 DIAGNOSIS — M1990 Unspecified osteoarthritis, unspecified site: Secondary | ICD-10-CM | POA: Diagnosis not present

## 2017-12-12 DIAGNOSIS — R7611 Nonspecific reaction to tuberculin skin test without active tuberculosis: Secondary | ICD-10-CM | POA: Diagnosis not present

## 2017-12-12 DIAGNOSIS — M25569 Pain in unspecified knee: Secondary | ICD-10-CM | POA: Diagnosis not present

## 2017-12-12 DIAGNOSIS — M79643 Pain in unspecified hand: Secondary | ICD-10-CM | POA: Diagnosis not present

## 2017-12-12 DIAGNOSIS — M7989 Other specified soft tissue disorders: Secondary | ICD-10-CM | POA: Diagnosis not present

## 2018-01-11 DIAGNOSIS — R7611 Nonspecific reaction to tuberculin skin test without active tuberculosis: Secondary | ICD-10-CM | POA: Diagnosis not present

## 2018-01-11 DIAGNOSIS — M7989 Other specified soft tissue disorders: Secondary | ICD-10-CM | POA: Diagnosis not present

## 2018-01-11 DIAGNOSIS — M1990 Unspecified osteoarthritis, unspecified site: Secondary | ICD-10-CM | POA: Diagnosis not present

## 2018-01-11 DIAGNOSIS — M79673 Pain in unspecified foot: Secondary | ICD-10-CM | POA: Diagnosis not present

## 2018-01-11 DIAGNOSIS — M25569 Pain in unspecified knee: Secondary | ICD-10-CM | POA: Diagnosis not present

## 2018-01-11 DIAGNOSIS — R748 Abnormal levels of other serum enzymes: Secondary | ICD-10-CM | POA: Diagnosis not present

## 2018-01-11 DIAGNOSIS — M79643 Pain in unspecified hand: Secondary | ICD-10-CM | POA: Diagnosis not present

## 2018-01-13 ENCOUNTER — Telehealth: Payer: Self-pay | Admitting: *Deleted

## 2018-01-13 NOTE — Telephone Encounter (Signed)
I have asked our ID pharmacist, [name] to contact [name] and bring her in for further evaluation.

## 2018-01-13 NOTE — Telephone Encounter (Signed)
Neuro office called to advise they are faxing labs from recent visit for Dr Megan Salon to review as the patient LFT's are elevated. Notes received and placed on Dr Hale Bogus desk for review.

## 2018-01-13 NOTE — Telephone Encounter (Signed)
[name] to discuss labs and ask her to come in to alter therapy.  She will come in to see [place] Monday at 1130am.  No major interactions with rifampin on her medication list.  Listed slight interaction with rifampin, coreg, and plavix - monitor therapy closely is the recommendation.  Will do rifampin 600 mg PO daily x 4 months if ok with Dr. Megan Salon.

## 2018-01-16 ENCOUNTER — Ambulatory Visit (INDEPENDENT_AMBULATORY_CARE_PROVIDER_SITE_OTHER): Payer: Medicare Other | Admitting: Pharmacist

## 2018-01-16 DIAGNOSIS — R7611 Nonspecific reaction to tuberculin skin test without active tuberculosis: Secondary | ICD-10-CM

## 2018-01-16 DIAGNOSIS — Z227 Latent tuberculosis: Secondary | ICD-10-CM

## 2018-01-16 MED ORDER — RIFAMPIN 300 MG PO CAPS: 600.0000 mg | ORAL_CAPSULE | Freq: Every day | ORAL | 3 refills | Status: DC

## 2018-01-16 NOTE — Progress Notes (Signed)
[facility] for Infectious Disease Pharmacy Visit  HPI: [name] is a 84 y.o. female who presents to the [facility] clinic for latent TB follow-up.  Patient Active Problem List   Diagnosis Date Noted  . Anal fissure 11/10/2017  . Rectal bleeding 11/10/2017  . Rectal pain 11/10/2017  . Inflammatory arthritis 10/13/2017  . Latent tuberculosis by blood test 10/13/2017  . Chest pain with high risk for cardiac etiology 08/11/2017  . Chronic diastolic CHF (congestive heart failure), NYHA class 2 ([place]) 08/11/2017  . Fatigue due to treatment 01/09/2017  . NSTEMI (non-ST elevated myocardial infarction) ([place]) 11/01/2016  . Diabetes mellitus ([place]) 10/24/2015  . Non-insulin dependent type 2 diabetes mellitus ([place]) 10/24/2015  . Angina, class I ([place]) 04/22/2015  . DOE (dyspnea on exertion) 04/22/2015  . Leg edema, left 04/22/2015  . Accumulation of fluid in tissues 03/21/2014  . Vertigo, central 01/02/2014  . Vertigo 01/02/2014  . Limb pain- echymosis, pain Lt radial artery after cath 12/11/2013  . Chest pain with low risk for cardiac etiology- patent grafts on cath 11/26/13; ? microvascular ischemia 11/28/2013  . Dyslipidemia, goal LDL below 70 05/17/2013  . Accelerated hypertension with diastolic congestive heart failure, NYHA class 3 ([place]) 05/17/2013  . Claudication ([place]) 05/16/2013  . PTA/ Stent Rt popliteal and Rt peroneal artery 05/16/13 05/16/2013  . PAD,severe calcified pop and tibial peroneal trunk disease bilateraly   . ABDOMINAL PAIN RIGHT LOWER QUADRANT 10/07/2010  . CVA-STROKE 03/04/2010  . BARRETT'S ESOPHAGUS 03/04/2010  . FECAL INCONTINENCE 03/04/2010  . DM 01/14/2010  . ANEMIA OF CHRONIC DISEASE 01/14/2010  . Atherosclerotic heart disease of native coronary artery with angina pectoris ([place]) 01/14/2010  . DIVERTICULOSIS OF COLON 01/14/2010  . PERSONAL HISTORY OF COLONIC POLYPS 01/14/2010  . [place] ALLERGY 01/14/2010    Patient's Medications  New Prescriptions    RIFAMPIN (RIFADIN) 300 MG CAPSULE    Take 2 capsules (600 mg total) by mouth daily before breakfast. Please take on an empty stomach.  Previous Medications   CARVEDILOL (COREG) 3.125 MG TABLET    Take 1 tablet (3.125 mg total) by mouth 2 (two) times daily.   CLOPIDOGREL (PLAVIX) 75 MG TABLET    Take 75 mg by mouth daily.   COENZYME Q10 (CO Q 10 PO)    Take 1 tablet by mouth at bedtime.    EZETIMIBE (ZETIA) 10 MG TABLET    Take 10 mg by mouth daily.   GABAPENTIN (NEURONTIN) 300 MG CAPSULE    Take 1 capsule (300 mg total) by mouth 3 (three) times daily.   ISOSORBIDE MONONITRATE (IMDUR) 60 MG 24 HR TABLET    Take 60 mg by mouth daily.   NAPROXEN SODIUM (ANAPROX) 220 MG TABLET    Take 440 mg by mouth 2 (two) times daily as needed.   NITROGLYCERIN (NITROSTAT) 0.4 MG SL TABLET    Place 0.4 mg under the tongue every 5 (five) minutes as needed for chest pain.   NONFORMULARY OR COMPOUNDED ITEM    Nitro gel 0.125 % bid x 6-8 weeks.   PANTOPRAZOLE (PROTONIX) 40 MG TABLET    Take 1 tablet (40 mg total) by mouth 2 (two) times daily before a meal.   PREDNISONE (DELTASONE) 5 MG TABLET    Take 5 mg by mouth daily with breakfast.   ROSUVASTATIN (CRESTOR) 40 MG TABLET    Take 40 mg by mouth daily.   TRULICITY 1.5 ZS/0.1UX SOPN    Inject 1.5 mg into the skin once  a week.  Modified Medications   No medications on file  Discontinued Medications   ISONIAZID (NYDRAZID) 300 MG TABLET    Take 1 tablet (300 mg total) by mouth daily.   PYRIDOXINE (VITAMIN B-6) 50 MG TABLET    Take 1 tablet (50 mg total) by mouth daily.    Allergies: Allergies  Allergen Reactions  . Fish Allergy Hives and Shortness Of Breath  . Ivp Dye [Iodinated Diagnostic Agents] Shortness Of Breath and Anaphylaxis  . Latex Hives, Shortness Of Breath and Itching    REACTION: wheezing  . Shellfish-Derived Products Anaphylaxis and Hives  . Other     Patient reports allergy to perfumed detergents. Causes itching.  . Metformin Nausea Only     Past Medical History: Past Medical History:  Diagnosis Date  . Atherosclerotic heart disease native coronary artery w/angina pectoris ([place]) 1992   a. 1992 s/p PTCA of LAD;  b. 1998 CABG x 3; c. 07/2001 Cath: Sev LM/LAD/LCX dzs, 3/3 patent grafts; d. 11/2013 Cath: stable graft anatomy; e. 10/2016 Cath: native 3VD, VG->OM nl, LIMA->LAD nl, VG->Diag 55.  . Bradycardia, mild briefly to the 40s, asymptomatic 05/16/2013  . Carotid arterial disease ([place])    a. 05/2014 Carotid U/S: No signif bilat ICA stenosis, >60% L ECA.  Marland Kitchen Chronic anemia   . Chronic diastolic CHF (congestive heart failure) ([place])    a. 10/2016 Echo: Ef 55-60%, no rwma, Gr1 DD, triv AI, PASP 33mmHg, atrial septal aneurysm.  . Chronic diastolic CHF (congestive heart failure), NYHA class 2 ([place]) 08/11/2017  . DM (diabetes mellitus), type 2 with peripheral vascular complications (HCC)    On Oral medications.  . Essential hypertension   . GERD (gastroesophageal reflux disease)   . Hyperlipidemia with target LDL less than 70   . PAD (peripheral artery disease) ([place]) 05/2013; 09/2013   a. severe calcified POP-1 & TP Trunk Dz bilat;  b . 05/2013 Diamondback Rot Athrectomy (R POP) --> PTA R Pop, DES to R Peroneal;  c. 09/2013 PTA/Stenting of L Pop [name] - retrograde access);  d. 04/2014 ABI's: R/L: 1.1/1.1.  Marland Kitchen Peptic ulcer disease   . S/P CABG x 3 1998   LIMA-LAD, SVG-OM, SVG-D1  . Severe claudication ([place]) 05/2013   Referred for Peripheral Angio (Dr. Gwenlyn Found --> Dr. Brunetta Jeans in [place])    Social History: Social History   Socioeconomic History  . Marital status: Married    Spouse name: Not on file  . Number of children: Not on file  . Years of education: Not on file  . Highest education level: Not on file  Social Needs  . Financial resource strain: Not on file  . Food insecurity - worry: Not on file  . Food insecurity - inability: Not on file  . Transportation needs - medical: Not on file  . Transportation needs -  non-medical: Not on file  Occupational History  . Not on file  Tobacco Use  . Smoking status: Never Smoker  . Smokeless tobacco: Never Used  Substance and Sexual Activity  . Alcohol use: No    Comment: rare glass of wine   . Drug use: No  . Sexual activity: Not on file  Other Topics Concern  . Not on file  Social History Narrative    She is a married mother of 45, grandmother of 46, great grandmother of 15.    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    She does not really get  a lot of exercise,    She does not smoke or drink EtOH.   Lipids:    Component Value Date/Time   CHOL 138 05/19/2017 1254   TRIG 55 05/19/2017 1254   HDL 54 05/19/2017 1254   CHOLHDL 2.6 05/19/2017 1254   CHOLHDL 3.6 11/02/2016 0640   VLDL 11 11/02/2016 0640   LDLCALC 73 05/19/2017 1254    Current Regimen: Isoniazid + pyridoxine x 9 months  Assessment: [name] is here today to follow-up for her latent TB infection. She was started on isoniazid and pyridoxine back in November and has been taking it religiously on an empty stomach. She does say she has been suffering from diarrhea every once and awhile from the medication.  She also does not like taking it on an empty stomach as it hurts her stomach until she eats. We have been checking her LFTs periodically and recently they have been trending up:  07/18/17: AST 24, ALT 28  10/17/17: AST 29, ALT 43  11/01/17: AST 22, ALT 30  12/12/17: AST 81, ALT 87  01/11/18: AST 130, ALT 245  I brought her in today to see me so we can change her regimen since she cannot tolerate the isoniazid. She stopped taking her isoniazid two days ago.  I will change her to rifampin x 4 months today.  There isn't much data about switching therapies and counting the months she was already on isoniazid to her total time needed to be on treatment.  She will start over and complete the full 4 months.   I explained how to take rifampin. She will be on 600 mg by mouth  daily - 2, 300 mg capsules.  Explained how she needed to take it on an empty stomach at least 1 hour before or 2 hours after eating a meal and also separate it from her Protonix she takes for acid reflux.  I made a medication calendar for her.  We decided she would take the rifampin as soon as she gets up in the morning around 7am and then take the pantoprazole and eat breakfast around 9-10am.  That is how she was taking her isoniazid and it seemed to work. I went over possible side effects with her and gave her my card to call me with any questions or issues.  She has 2 pills left of the isoniazid and will bring it with her next time she is seen so we can dispose of it properly.  She had a f/u with Dr. Megan Salon scheduled for tomorrow, but I will reschedule since she came in today.  I will have her come back to recheck her LFTs in 2-3 weeks and have her see Dr. Megan Salon in ~1 month.  Plan: - Stop isoniazid and pyridoxine - Start rifampin 600 mg (2 capsules) PO daily - F/u for LFTs 2/19 at 11:15am - F/u with Dr. Megan Salon 2/28 at [place]. Kuppelweiser, PharmD, [place], [facility] for Infectious Disease 01/16/2018, 3:03 PM

## 2018-01-17 ENCOUNTER — Ambulatory Visit: Payer: Medicare Other | Admitting: Internal Medicine

## 2018-01-25 ENCOUNTER — Other Ambulatory Visit: Payer: Self-pay | Admitting: Pharmacist

## 2018-01-25 ENCOUNTER — Telehealth: Payer: Self-pay | Admitting: Pharmacist

## 2018-01-25 DIAGNOSIS — R110 Nausea: Secondary | ICD-10-CM

## 2018-01-25 DIAGNOSIS — R197 Diarrhea, unspecified: Secondary | ICD-10-CM

## 2018-01-25 MED ORDER — ONDANSETRON 4 MG PO TBDP: 4.0000 mg | ORAL_TABLET | Freq: Three times a day (TID) | ORAL | 0 refills | Status: DC | PRN

## 2018-01-25 MED ORDER — DIPHENOXYLATE-ATROPINE 2.5-0.025 MG PO TABS: 1.0000 | ORAL_TABLET | Freq: Four times a day (QID) | ORAL | 0 refills | Status: DC | PRN

## 2018-01-25 NOTE — Telephone Encounter (Signed)
Averey called this morning having some issues with nausea and diarrhea on the rifampin.  She states she is going around 6 times per day and extremely nauseous all throughout the day.  She also said she "blacked out" and fell in the store on Monday.  She stated her right hand went numb and she fell.  She was helped by a few people then recovered and went home.  She did not get checked out or go to the doctor after this.  She was wondering if that was due to rifampin.  I told her that most likely was not due to the rifampin.  She is taking some pepto-bismol and imodium before she ran out for her diarrhea.  She states the imodium helps some. I told her she probably needed to get checked out after that fall and that she may be dehydrated from all the diarrhea.  I told her to make sure she was drinking a lot of fluids.  I will send in some Zofran for her and some Lomotil as well. She is wondering if she can get in earlier to see Dr. Megan Salon since she is not scheduled until the 28th. I told her I would discuss with Dr. Megan Salon and let her know.

## 2018-01-25 NOTE — Telephone Encounter (Signed)
Thank you :)

## 2018-01-25 NOTE — Telephone Encounter (Signed)
I will move her appointment up Tuesday, 10/31/2018.

## 2018-01-31 ENCOUNTER — Ambulatory Visit (INDEPENDENT_AMBULATORY_CARE_PROVIDER_SITE_OTHER): Payer: Medicare Other | Admitting: Internal Medicine

## 2018-01-31 ENCOUNTER — Encounter: Payer: Self-pay | Admitting: Internal Medicine

## 2018-01-31 ENCOUNTER — Other Ambulatory Visit: Payer: Medicare Other

## 2018-01-31 DIAGNOSIS — R197 Diarrhea, unspecified: Secondary | ICD-10-CM | POA: Diagnosis not present

## 2018-01-31 DIAGNOSIS — Z227 Latent tuberculosis: Secondary | ICD-10-CM

## 2018-01-31 DIAGNOSIS — R7611 Nonspecific reaction to tuberculin skin test without active tuberculosis: Secondary | ICD-10-CM

## 2018-01-31 LAB — COMPREHENSIVE METABOLIC PANEL
AG RATIO: 1.5 (calc) (ref 1.0–2.5)
ALBUMIN MSPROF: 3.8 g/dL (ref 3.6–5.1)
ALT: 27 U/L (ref 6–29)
AST: 26 U/L (ref 10–35)
Alkaline phosphatase (APISO): 41 U/L (ref 33–130)
BUN / CREAT RATIO: 15 (calc) (ref 6–22)
BUN: 17 mg/dL (ref 7–25)
CHLORIDE: 105 mmol/L (ref 98–110)
CO2: 27 mmol/L (ref 20–32)
CREATININE: 1.15 mg/dL — AB (ref 0.60–0.88)
Calcium: 9 mg/dL (ref 8.6–10.4)
GLOBULIN: 2.6 g/dL (ref 1.9–3.7)
GLUCOSE: 170 mg/dL — AB (ref 65–99)
POTASSIUM: 4.1 mmol/L (ref 3.5–5.3)
Sodium: 141 mmol/L (ref 135–146)
Total Bilirubin: 0.7 mg/dL (ref 0.2–1.2)
Total Protein: 6.4 g/dL (ref 6.1–8.1)

## 2018-01-31 NOTE — Assessment & Plan Note (Signed)
I will have her stop rifampin now.  I will repeat her liver enzymes and see her back in 1 week.

## 2018-01-31 NOTE — Progress Notes (Signed)
[facility] for Infectious Disease  Patient Active Problem List   Diagnosis Date Noted  . Diarrhea 01/31/2018    Priority: High  . Latent tuberculosis by blood test 10/13/2017    Priority: High  . Inflammatory arthritis 10/13/2017    Priority: Medium  . Anal fissure 11/10/2017  . Rectal bleeding 11/10/2017  . Rectal pain 11/10/2017  . Chest pain with high risk for cardiac etiology 08/11/2017  . Chronic diastolic CHF (congestive heart failure), NYHA class 2 ([place]) 08/11/2017  . Fatigue due to treatment 01/09/2017  . NSTEMI (non-ST elevated myocardial infarction) ([place]) 11/01/2016  . Diabetes mellitus ([place]) 10/24/2015  . Non-insulin dependent type 2 diabetes mellitus ([place]) 10/24/2015  . Angina, class I ([place]) 04/22/2015  . DOE (dyspnea on exertion) 04/22/2015  . Leg edema, left 04/22/2015  . Accumulation of fluid in tissues 03/21/2014  . Vertigo, central 01/02/2014  . Vertigo 01/02/2014  . Limb pain- echymosis, pain Lt radial artery after cath 12/11/2013  . Chest pain with low risk for cardiac etiology- patent grafts on cath 11/26/13; ? microvascular ischemia 11/28/2013  . Dyslipidemia, goal LDL below 70 05/17/2013  . Accelerated hypertension with diastolic congestive heart failure, NYHA class 3 ([place]) 05/17/2013  . Claudication ([place]) 05/16/2013  . PTA/ Stent Rt popliteal and Rt peroneal artery 05/16/13 05/16/2013  . PAD,severe calcified pop and tibial peroneal trunk disease bilateraly   . ABDOMINAL PAIN RIGHT LOWER QUADRANT 10/07/2010  . CVA-STROKE 03/04/2010  . BARRETT'S ESOPHAGUS 03/04/2010  . FECAL INCONTINENCE 03/04/2010  . DM 01/14/2010  . ANEMIA OF CHRONIC DISEASE 01/14/2010  . Atherosclerotic heart disease of native coronary artery with angina pectoris ([place]) 01/14/2010  . DIVERTICULOSIS OF COLON 01/14/2010  . PERSONAL HISTORY OF COLONIC POLYPS 01/14/2010  . SHELLFISH ALLERGY 01/14/2010    Patient's Medications  New Prescriptions   No medications on file   Previous Medications   CARVEDILOL (COREG) 3.125 MG TABLET    Take 1 tablet (3.125 mg total) by mouth 2 (two) times daily.   CLOPIDOGREL (PLAVIX) 75 MG TABLET    Take 75 mg by mouth daily.   COENZYME Q10 (CO Q 10 PO)    Take 1 tablet by mouth at bedtime.    DIPHENOXYLATE-ATROPINE (LOMOTIL) 2.5-0.025 MG TABLET    Take 1 tablet by mouth 4 (four) times daily as needed for diarrhea or loose stools.   EZETIMIBE (ZETIA) 10 MG TABLET    Take 10 mg by mouth daily.   GABAPENTIN (NEURONTIN) 300 MG CAPSULE    Take 1 capsule (300 mg total) by mouth 3 (three) times daily.   HYDROXYCHLOROQUINE (PLAQUENIL) 200 MG TABLET    Take 400 mg by mouth daily.   ISOSORBIDE MONONITRATE (IMDUR) 60 MG 24 HR TABLET    Take 60 mg by mouth daily.   NAPROXEN SODIUM (ANAPROX) 220 MG TABLET    Take 440 mg by mouth 2 (two) times daily as needed.   NITROGLYCERIN (NITROSTAT) 0.4 MG SL TABLET    Place 0.4 mg under the tongue every 5 (five) minutes as needed for chest pain.   NONFORMULARY OR COMPOUNDED ITEM    Nitro gel 0.125 % bid x 6-8 weeks.   ONDANSETRON (ZOFRAN ODT) 4 MG DISINTEGRATING TABLET    Take 1 tablet (4 mg total) by mouth every 8 (eight) hours as needed for nausea or vomiting.   PANTOPRAZOLE (PROTONIX) 40 MG TABLET    Take 1 tablet (40 mg total) by mouth 2 (two) times  daily before a meal.   PREDNISONE (DELTASONE) 5 MG TABLET    Take 10 mg by mouth daily with breakfast.   ROSUVASTATIN (CRESTOR) 40 MG TABLET    Take 40 mg by mouth daily.   TRULICITY 1.5 HK/7.4QV SOPN    Inject 1.5 mg into the skin once a week.  Modified Medications   No medications on file  Discontinued Medications   RIFAMPIN (RIFADIN) 300 MG CAPSULE    Take 2 capsules (600 mg total) by mouth daily before breakfast. Please take on an empty stomach.    Subjective: Stepheni is in for a work in visit.  She switch from isoniazid to rifampin on 01/16/2018 after developing liver enzyme elevation on isoniazid.  She has been having daily problems with nausea and  watery diarrhea.  Review of Systems: Review of Systems  Constitutional: Negative for chills, diaphoresis, fever and weight loss.  Gastrointestinal: Positive for diarrhea and nausea. Negative for abdominal pain, heartburn and vomiting.    Past Medical History:  Diagnosis Date  . Atherosclerotic heart disease native coronary artery w/angina pectoris ([place]) 1992   a. 1992 s/p PTCA of LAD;  b. 1998 CABG x 3; c. 07/2001 Cath: Sev LM/LAD/LCX dzs, 3/3 patent grafts; d. 11/2013 Cath: stable graft anatomy; e. 10/2016 Cath: native 3VD, VG->OM nl, LIMA->LAD nl, VG->Diag 55.  . Bradycardia, mild briefly to the 40s, asymptomatic 05/16/2013  . Carotid arterial disease ([place])    a. 05/2014 Carotid U/S: No signif bilat ICA stenosis, >60% L ECA.  Marland Kitchen Chronic anemia   . Chronic diastolic CHF (congestive heart failure) ([place])    a. 10/2016 Echo: Ef 55-60%, no rwma, Gr1 DD, triv AI, PASP 32mmHg, atrial septal aneurysm.  . Chronic diastolic CHF (congestive heart failure), NYHA class 2 ([place]) 08/11/2017  . DM (diabetes mellitus), type 2 with peripheral vascular complications (HCC)    On Oral medications.  . Essential hypertension   . GERD (gastroesophageal reflux disease)   . Hyperlipidemia with target LDL less than 70   . PAD (peripheral artery disease) ([place]) 05/2013; 09/2013   a. severe calcified POP-1 & TP Trunk Dz bilat;  b . 05/2013 Diamondback Rot Athrectomy (R POP) --> PTA R Pop, DES to R Peroneal;  c. 09/2013 PTA/Stenting of L Pop [name] - retrograde access);  d. 04/2014 ABI's: R/L: 1.1/1.1.  Marland Kitchen Peptic ulcer disease   . S/P CABG x 3 1998   LIMA-LAD, SVG-OM, SVG-D1  . Severe claudication ([place]) 05/2013   Referred for Peripheral Angio (Dr. Gwenlyn Found --> Dr. Brunetta Jeans in [place])    Social History   Tobacco Use  . Smoking status: Never Smoker  . Smokeless tobacco: Never Used  Substance Use Topics  . Alcohol use: No    Comment: rare glass of wine   . Drug use: No    Family History  Problem Relation Age of  Onset  . Hypertension Mother   . Hyperlipidemia Mother   . Hyperlipidemia Father   . Hypertension Father     Allergies  Allergen Reactions  . Fish Allergy Hives and Shortness Of Breath  . Ivp Dye [Iodinated Diagnostic Agents] Shortness Of Breath and Anaphylaxis  . Latex Hives, Shortness Of Breath and Itching    REACTION: wheezing  . Shellfish-Derived Products Anaphylaxis and Hives  . Other     Patient reports allergy to perfumed detergents. Causes itching.  . Metformin Nausea Only    Objective: Vitals:   01/31/18 1150  BP: (!) 147/85  Pulse: 98  Temp: (!) 97.5 F (36.4 C)  TempSrc: Oral  Weight: 162 lb (73.5 kg)   Body mass index is 28.7 kg/m.  Physical Exam  Constitutional: She is oriented to person, place, and time.  She was 30 minutes late to her appointment because she had to go back home with watery diarrhea.  She is in good spirits but is obviously exhausted by not feeling well over the past several weeks.  Cardiovascular: Normal rate and regular rhythm.  No murmur heard. Pulmonary/Chest: Effort normal and breath sounds normal.  Abdominal: Soft. She exhibits no distension. There is no tenderness.  Neurological: She is alert and oriented to person, place, and time.  Psychiatric: Mood and affect normal.    Lab Results    Problem List Items Addressed This Visit      High   Diarrhea    I will screen her for C. difficile diarrhea.      Relevant Orders   Clostridium Difficile by PCR   Latent tuberculosis by blood test    I will have her stop rifampin now.  I will repeat her liver enzymes and see her back in 1 week.      Relevant Orders   Comprehensive metabolic panel       Michel Bickers, MD [facility] for [place] [phone] pager   [phone] cell 01/31/2018, 12:11 PM

## 2018-01-31 NOTE — Assessment & Plan Note (Signed)
I will screen her for C. difficile diarrhea.

## 2018-02-02 ENCOUNTER — Other Ambulatory Visit: Payer: Medicare Other

## 2018-02-02 ENCOUNTER — Encounter: Payer: Self-pay | Admitting: Internal Medicine

## 2018-02-02 DIAGNOSIS — Z86718 Personal history of other venous thrombosis and embolism: Secondary | ICD-10-CM | POA: Diagnosis not present

## 2018-02-02 DIAGNOSIS — M79662 Pain in left lower leg: Secondary | ICD-10-CM | POA: Diagnosis not present

## 2018-02-02 DIAGNOSIS — R197 Diarrhea, unspecified: Secondary | ICD-10-CM | POA: Diagnosis not present

## 2018-02-02 DIAGNOSIS — M1612 Unilateral primary osteoarthritis, left hip: Secondary | ICD-10-CM | POA: Diagnosis not present

## 2018-02-02 DIAGNOSIS — M79605 Pain in left leg: Secondary | ICD-10-CM | POA: Diagnosis not present

## 2018-02-02 DIAGNOSIS — I739 Peripheral vascular disease, unspecified: Secondary | ICD-10-CM | POA: Diagnosis not present

## 2018-02-07 DIAGNOSIS — M1990 Unspecified osteoarthritis, unspecified site: Secondary | ICD-10-CM | POA: Diagnosis not present

## 2018-02-07 DIAGNOSIS — M7989 Other specified soft tissue disorders: Secondary | ICD-10-CM | POA: Diagnosis not present

## 2018-02-07 DIAGNOSIS — M79673 Pain in unspecified foot: Secondary | ICD-10-CM | POA: Diagnosis not present

## 2018-02-07 DIAGNOSIS — R7611 Nonspecific reaction to tuberculin skin test without active tuberculosis: Secondary | ICD-10-CM | POA: Diagnosis not present

## 2018-02-07 DIAGNOSIS — M25569 Pain in unspecified knee: Secondary | ICD-10-CM | POA: Diagnosis not present

## 2018-02-07 DIAGNOSIS — M79606 Pain in leg, unspecified: Secondary | ICD-10-CM | POA: Diagnosis not present

## 2018-02-07 DIAGNOSIS — M79643 Pain in unspecified hand: Secondary | ICD-10-CM | POA: Diagnosis not present

## 2018-02-09 ENCOUNTER — Ambulatory Visit: Payer: Medicare Other | Admitting: Internal Medicine

## 2018-02-12 ENCOUNTER — Other Ambulatory Visit: Payer: Self-pay | Admitting: Nurse Practitioner

## 2018-02-13 ENCOUNTER — Encounter: Payer: Self-pay | Admitting: Internal Medicine

## 2018-02-13 ENCOUNTER — Ambulatory Visit (INDEPENDENT_AMBULATORY_CARE_PROVIDER_SITE_OTHER): Payer: Medicare Other | Admitting: Internal Medicine

## 2018-02-13 DIAGNOSIS — R7611 Nonspecific reaction to tuberculin skin test without active tuberculosis: Secondary | ICD-10-CM

## 2018-02-13 DIAGNOSIS — Z227 Latent tuberculosis: Secondary | ICD-10-CM

## 2018-02-13 MED ORDER — ISONIAZID 300 MG PO TABS: 300.0000 mg | ORAL_TABLET | Freq: Every day | ORAL | 0 refills | Status: DC

## 2018-02-13 MED ORDER — VITAMIN B-6 50 MG PO TABS
50.0000 mg | ORAL_TABLET | Freq: Every day | ORAL | 2 refills | Status: DC
Start: 2018-02-13 — End: 2018-05-30

## 2018-02-13 NOTE — Assessment & Plan Note (Signed)
She was able to complete 3 months of isoniazid for her leg closest before switching to rifampin which she could not tolerate.  I doubt that she would tolerate any rifamycin-based regimen.  I discussed options with her.  At this point going back on isoniazid with careful monitoring of her liver enzymes is really her only good option for completing therapy.  She is very eager to complete treatment.  I will start her back on isoniazid and seeing and see her back in 4 weeks for repeat lab work.  Recent liver enzymes were normal

## 2018-02-13 NOTE — Progress Notes (Signed)
[facility] for Infectious Disease  Patient Active Problem List   Diagnosis Date Noted  . Diarrhea 01/31/2018    Priority: High  . Latent tuberculosis by blood test 10/13/2017    Priority: High  . Inflammatory arthritis 10/13/2017    Priority: Medium  . Anal fissure 11/10/2017  . Rectal bleeding 11/10/2017  . Rectal pain 11/10/2017  . Chest pain with high risk for cardiac etiology 08/11/2017  . Chronic diastolic CHF (congestive heart failure), NYHA class 2 ([place]) 08/11/2017  . Fatigue due to treatment 01/09/2017  . NSTEMI (non-ST elevated myocardial infarction) ([place]) 11/01/2016  . Diabetes mellitus ([place]) 10/24/2015  . Non-insulin dependent type 2 diabetes mellitus ([place]) 10/24/2015  . Angina, class I ([place]) 04/22/2015  . DOE (dyspnea on exertion) 04/22/2015  . Leg edema, left 04/22/2015  . Accumulation of fluid in tissues 03/21/2014  . Vertigo, central 01/02/2014  . Vertigo 01/02/2014  . Limb pain- echymosis, pain Lt radial artery after cath 12/11/2013  . Chest pain with low risk for cardiac etiology- patent grafts on cath 11/26/13; ? microvascular ischemia 11/28/2013  . Dyslipidemia, goal LDL below 70 05/17/2013  . Accelerated hypertension with diastolic congestive heart failure, NYHA class 3 ([place]) 05/17/2013  . Claudication ([place]) 05/16/2013  . PTA/ Stent Rt popliteal and Rt peroneal artery 05/16/13 05/16/2013  . PAD,severe calcified pop and tibial peroneal trunk disease bilateraly   . ABDOMINAL PAIN RIGHT LOWER QUADRANT 10/07/2010  . CVA-STROKE 03/04/2010  . BARRETT'S ESOPHAGUS 03/04/2010  . FECAL INCONTINENCE 03/04/2010  . DM 01/14/2010  . ANEMIA OF CHRONIC DISEASE 01/14/2010  . Atherosclerotic heart disease of native coronary artery with angina pectoris ([place]) 01/14/2010  . DIVERTICULOSIS OF COLON 01/14/2010  . PERSONAL HISTORY OF COLONIC POLYPS 01/14/2010  . SHELLFISH ALLERGY 01/14/2010    Patient's Medications  New Prescriptions   PYRIDOXINE (VITAMIN  B-6) 50 MG TABLET    Take 1 tablet (50 mg total) by mouth daily.  Previous Medications   CARVEDILOL (COREG) 3.125 MG TABLET    Take 1 tablet (3.125 mg total) by mouth 2 (two) times daily.   CLOPIDOGREL (PLAVIX) 75 MG TABLET    Take 75 mg by mouth daily.   COENZYME Q10 (CO Q 10 PO)    Take 1 tablet by mouth at bedtime.    DIPHENOXYLATE-ATROPINE (LOMOTIL) 2.5-0.025 MG TABLET    Take 1 tablet by mouth 4 (four) times daily as needed for diarrhea or loose stools.   EZETIMIBE (ZETIA) 10 MG TABLET    Take 10 mg by mouth daily.   GABAPENTIN (NEURONTIN) 300 MG CAPSULE    Take 1 capsule (300 mg total) by mouth 3 (three) times daily.   HYDROXYCHLOROQUINE (PLAQUENIL) 200 MG TABLET    Take 400 mg by mouth daily.   ISOSORBIDE MONONITRATE (IMDUR) 60 MG 24 HR TABLET    Take 60 mg by mouth daily.   NAPROXEN SODIUM (ANAPROX) 220 MG TABLET    Take 440 mg by mouth 2 (two) times daily as needed.   NITROGLYCERIN (NITROSTAT) 0.4 MG SL TABLET    Place 0.4 mg under the tongue every 5 (five) minutes as needed for chest pain.   NONFORMULARY OR COMPOUNDED ITEM    Nitro gel 0.125 % bid x 6-8 weeks.   ONDANSETRON (ZOFRAN ODT) 4 MG DISINTEGRATING TABLET    Take 1 tablet (4 mg total) by mouth every 8 (eight) hours as needed for nausea or vomiting.   PANTOPRAZOLE (PROTONIX) 40 MG TABLET  Take 1 tablet (40 mg total) by mouth 2 (two) times daily before a meal.   PREDNISONE (DELTASONE) 5 MG TABLET    Take 10 mg by mouth daily with breakfast.   ROSUVASTATIN (CRESTOR) 40 MG TABLET    Take 40 mg by mouth daily.   TRULICITY 1.5 ZO/1.0RU SOPN    Inject 1.5 mg into the skin once a week.  Modified Medications   Modified Medication Previous Medication   ISONIAZID (NYDRAZID) 300 MG TABLET isoniazid (NYDRAZID) 300 MG tablet      Take 1 tablet (300 mg total) by mouth daily.    Take 1 tablet (300 mg total) by mouth daily.  Discontinued Medications   No medications on file    Subjective: [name] is in for her routine follow-up visit.  She  was diagnosed with latent tuberculosis last fall.  She started isoniazid in early November.  She had a gradual increase in her liver transaminases to just over 3 times the upper limit of normal by late January.  She was not symptomatic but we elected to change her to rifampin.  She developed nausea, vomiting and diarrhea and could not tolerate rifampin.  She stopped it last week and is feeling better.  She is currently on prednisone 5 mg daily and hydroxychloroquine for her rheumatoid arthritis.  Review of Systems: Review of Systems  Constitutional: Negative for chills, diaphoresis, fever and weight loss.  Respiratory: Negative for cough, sputum production and shortness of breath.   Gastrointestinal: Negative for abdominal pain, diarrhea, nausea and vomiting.    Past Medical History:  Diagnosis Date  . Atherosclerotic heart disease native coronary artery w/angina pectoris ([place]) 1992   a. 1992 s/p PTCA of LAD;  b. 1998 CABG x 3; c. 07/2001 Cath: Sev LM/LAD/LCX dzs, 3/3 patent grafts; d. 11/2013 Cath: stable graft anatomy; e. 10/2016 Cath: native 3VD, VG->OM nl, LIMA->LAD nl, VG->Diag 55.  . Bradycardia, mild briefly to the 40s, asymptomatic 05/16/2013  . Carotid arterial disease ([place])    a. 05/2014 Carotid U/S: No signif bilat ICA stenosis, >60% L ECA.  Marland Kitchen Chronic anemia   . Chronic diastolic CHF (congestive heart failure) ([place])    a. 10/2016 Echo: Ef 55-60%, no rwma, Gr1 DD, triv AI, PASP 75mmHg, atrial septal aneurysm.  . Chronic diastolic CHF (congestive heart failure), NYHA class 2 ([place]) 08/11/2017  . DM (diabetes mellitus), type 2 with peripheral vascular complications (HCC)    On Oral medications.  . Essential hypertension   . GERD (gastroesophageal reflux disease)   . Hyperlipidemia with target LDL less than 70   . PAD (peripheral artery disease) ([place]) 05/2013; 09/2013   a. severe calcified POP-1 & TP Trunk Dz bilat;  b . 05/2013 Diamondback Rot Athrectomy (R POP) --> PTA R Pop, DES to R  Peroneal;  c. 09/2013 PTA/Stenting of L Pop [name] - retrograde access);  d. 04/2014 ABI's: R/L: 1.1/1.1.  Marland Kitchen Peptic ulcer disease   . S/P CABG x 3 1998   LIMA-LAD, SVG-OM, SVG-D1  . Severe claudication ([place]) 05/2013   Referred for Peripheral Angio (Dr. Gwenlyn Found --> Dr. Brunetta Jeans in [place])    Social History   Tobacco Use  . Smoking status: Never Smoker  . Smokeless tobacco: Never Used  Substance Use Topics  . Alcohol use: No    Comment: rare glass of wine   . Drug use: No    Family History  Problem Relation Age of Onset  . Hypertension Mother   . Hyperlipidemia Mother   .  Hyperlipidemia Father   . Hypertension Father     Allergies  Allergen Reactions  . Fish Allergy Hives and Shortness Of Breath  . Ivp Dye [Iodinated Diagnostic Agents] Shortness Of Breath and Anaphylaxis  . Latex Hives, Shortness Of Breath and Itching    REACTION: wheezing  . Shellfish-Derived Products Anaphylaxis and Hives  . Other     Patient reports allergy to perfumed detergents. Causes itching.  . Metformin Nausea Only    Objective: Vitals:   02/13/18 1340  BP: (!) 183/99  Pulse: 78  Temp: 97.9 F (36.6 C)  TempSrc: Oral  Weight: 165 lb (74.8 kg)  Height: 5\' 3"  (1.6 m)   Body mass index is 29.23 kg/m.  Physical Exam  Constitutional: She is oriented to person, place, and time.  She is pleasant and in good spirits.  Pulmonary/Chest: Effort normal. She has no wheezes. She has no rales.  Abdominal: Soft. There is no tenderness. There is no rebound.  Neurological: She is alert and oriented to person, place, and time.  Skin: No rash noted.  Psychiatric: Mood and affect normal.    Lab Results    Problem List Items Addressed This Visit      High   Latent tuberculosis by blood test    She was able to complete 3 months of isoniazid for her leg closest before switching to rifampin which she could not tolerate.  I doubt that she would tolerate any rifamycin-based regimen.  I discussed  options with her.  At this point going back on isoniazid with careful monitoring of her liver enzymes is really her only good option for completing therapy.  She is very eager to complete treatment.  I will start her back on isoniazid and seeing and see her back in 4 weeks for repeat lab work.  Recent liver enzymes were normal      Relevant Medications   isoniazid (NYDRAZID) 300 MG tablet   pyridOXINE (VITAMIN B-6) 50 MG tablet       Michel Bickers, MD [facility] [phone] pager   [phone] cell 02/13/2018, 2:05 PM

## 2018-02-14 LAB — STOOL CULTURE: E COLI SHIGA TOXIN ASSAY: NEGATIVE

## 2018-02-14 LAB — CLOSTRIDIUM DIFFICILE BY PCR: Toxigenic C. Difficile by PCR: NEGATIVE

## 2018-02-14 LAB — O+P EXAM, FORMALIN ONLY

## 2018-02-14 LAB — SPECIMEN STATUS REPORT

## 2018-02-15 DIAGNOSIS — E119 Type 2 diabetes mellitus without complications: Secondary | ICD-10-CM | POA: Diagnosis not present

## 2018-02-15 DIAGNOSIS — H04123 Dry eye syndrome of bilateral lacrimal glands: Secondary | ICD-10-CM | POA: Diagnosis not present

## 2018-02-15 DIAGNOSIS — H52203 Unspecified astigmatism, bilateral: Secondary | ICD-10-CM | POA: Diagnosis not present

## 2018-02-15 DIAGNOSIS — H0100A Unspecified blepharitis right eye, upper and lower eyelids: Secondary | ICD-10-CM | POA: Diagnosis not present

## 2018-02-16 DIAGNOSIS — I10 Essential (primary) hypertension: Secondary | ICD-10-CM | POA: Diagnosis not present

## 2018-02-16 DIAGNOSIS — E118 Type 2 diabetes mellitus with unspecified complications: Secondary | ICD-10-CM | POA: Diagnosis not present

## 2018-02-16 DIAGNOSIS — Z79899 Other long term (current) drug therapy: Secondary | ICD-10-CM | POA: Diagnosis not present

## 2018-02-20 ENCOUNTER — Telehealth: Payer: Self-pay | Admitting: *Deleted

## 2018-02-20 MED ORDER — GABAPENTIN 300 MG PO CAPS: 300.0000 mg | ORAL_CAPSULE | Freq: Three times a day (TID) | ORAL | 5 refills | Status: DC

## 2018-02-20 NOTE — Telephone Encounter (Signed)
Refill request for Gabapentin 300mg  #90 one tid. LOV Dr. Paulla Dolly had ordered Gabapentin hs.

## 2018-02-20 NOTE — Telephone Encounter (Signed)
I spoke with [name] she states takes the Gabapentin once daily. Dr. Paulla Dolly states refill Gabapentin 300mg  one time daily #30, +5 refills, as in clinical 01/26/2017.

## 2018-02-21 DIAGNOSIS — Z5181 Encounter for therapeutic drug level monitoring: Secondary | ICD-10-CM | POA: Diagnosis not present

## 2018-02-23 DIAGNOSIS — I10 Essential (primary) hypertension: Secondary | ICD-10-CM | POA: Diagnosis not present

## 2018-02-23 DIAGNOSIS — Z79899 Other long term (current) drug therapy: Secondary | ICD-10-CM | POA: Diagnosis not present

## 2018-02-23 DIAGNOSIS — E789 Disorder of lipoprotein metabolism, unspecified: Secondary | ICD-10-CM | POA: Diagnosis not present

## 2018-02-23 DIAGNOSIS — E118 Type 2 diabetes mellitus with unspecified complications: Secondary | ICD-10-CM | POA: Diagnosis not present

## 2018-02-23 DIAGNOSIS — R945 Abnormal results of liver function studies: Secondary | ICD-10-CM | POA: Diagnosis not present

## 2018-03-01 ENCOUNTER — Ambulatory Visit (INDEPENDENT_AMBULATORY_CARE_PROVIDER_SITE_OTHER): Payer: Medicare Other | Admitting: Cardiovascular Disease

## 2018-03-01 ENCOUNTER — Encounter: Payer: Self-pay | Admitting: Cardiovascular Disease

## 2018-03-01 VITALS — BP 137/77 | HR 73 | Ht 63.0 in | Wt 165.0 lb

## 2018-03-01 DIAGNOSIS — I10 Essential (primary) hypertension: Secondary | ICD-10-CM

## 2018-03-01 DIAGNOSIS — I739 Peripheral vascular disease, unspecified: Secondary | ICD-10-CM | POA: Diagnosis not present

## 2018-03-01 NOTE — Patient Instructions (Signed)
Medication Instructions: Your physician recommends that you continue on your current medications as directed. Please refer to the Current Medication list given to you today.   Testing/Procedures: Your physician has requested that you have a lower extremity arterial duplex. During this test, ultrasound is used to evaluate arterial blood flow in the legs. Allow one hour for this exam. There are no restrictions or special instructions.  Your physician has requested that you have an ankle brachial index (ABI). During this test an ultrasound and blood pressure cuff are used to evaluate the arteries that supply the arms and legs with blood. Allow thirty minutes for this exam. There are no restrictions or special instructions.  Follow-Up: Your physician recommends that you schedule a follow-up appointment as needed with Dr. Berry.     

## 2018-03-01 NOTE — Assessment & Plan Note (Signed)
History of peripheral arterial disease status post diamondback orbital rotation atherectomy, PTCA and stenting of her distal right SFA as well as her peroneal artery using drug-eluting stents 05/16/13 by myself. I restudied her 07/20/13 with the intention of fixing her left leg however I thought her anatomy was too complex ultimately referred her to Dr. Brunetta Jeans [facility] who performed left popliteal and tibioperoneal trunk intervention 10/01/13. Follow-up Dopplers performed 11/10/16 revealed normal ABIs bilaterally with patent SFA and tibial vessels. She complains of an area of hypersensitivity on her left pretibial area as well as a strange sensation below that which does not spell or claudication. She apparently had venous Doppler studies performed at [facility] hat showed no evidence of DVT. I can feel palpable pedal pulses on the left. I'm going to get lower extremity or chill Doppler studies to further evaluate.

## 2018-03-01 NOTE — Progress Notes (Signed)
03/01/2018 [name]   [birth year]  [id number]  Primary Physician Jani Gravel, MD Primary Cardiologist: Lorretta Harp MD [name], [place]  HPI:  [name] is a 84 y.o.  married African American female patient of Dr. Shanon Brow Harding's and Dr. Leigh Aurora. I last saw her in the office 12/09/15.She has known CAD status post coronary bypass grafting in the past (1998). She has preserved LV function and nonischemic Myoview. Because of claudication she underwent peripheral vascular angiography and other [name] 02/22/13 revealing significant popliteal and infrapopliteal disease. She has lifestyle limiting claudication.on 05/16/13 she underwent diamondback orbital rotational atherectomy, PTA of her distal right SFA and stenting using coronary drug-eluting stent for peroneal artery which was her only patent artery on the right. She does not notice any improvement and claudication since her procedure although she says her right leg his less "heavy" and that her left leg is much more symptomatic.I restudied her 07/20/13 with the intention of fixing her left leg however I decided to defer this because of anatomic complexity. Ultimately I referred her to Dr. Brunetta Jeans in [place] who performed intervention on her left popliteal and tibioperoneal trunk on 10/01/13. Followup Dopplers performed 04/29/14 were excellent with ABIs of greater than 1 bilaterally, patent peroneal stent on the right a patent left popliteal. Subsequent Dopplers performed in December showed a right ABI of 0.81 and a left ABI 0.91 with patent stents  She had done well after intervention and actually said that she was able to ambulate with claudication. Her last Doppler studies performed 11/10/16 revealed normal ABIs bilaterally with patent SFAs and infrapopliteal vessels. She has noticed some superficial hypersensitivity on her left pretibial area that had venous Doppler studies performed recently that showed no evidence of  DVT.    Current Meds  Medication Sig  . clopidogrel (PLAVIX) 75 MG tablet Take 75 mg by mouth daily.  . Coenzyme Q10 (CO Q 10 PO) Take 1 tablet by mouth at bedtime.   Marland Kitchen ezetimibe (ZETIA) 10 MG tablet Take 10 mg by mouth daily.  Marland Kitchen gabapentin (NEURONTIN) 300 MG capsule Take 1 capsule (300 mg total) by mouth 3 (three) times daily.  . hydroxychloroquine (PLAQUENIL) 200 MG tablet Take 400 mg by mouth daily.  Marland Kitchen isoniazid (NYDRAZID) 300 MG tablet Take 1 tablet (300 mg total) by mouth daily.  . isosorbide mononitrate (IMDUR) 60 MG 24 hr tablet Take 60 mg by mouth daily.  . naproxen sodium (ANAPROX) 220 MG tablet Take 440 mg by mouth 2 (two) times daily as needed.  . nitroGLYCERIN (NITROSTAT) 0.4 MG SL tablet PLACE 1 TABLET (0.4 MG TOTAL) UNDER THE TONGUE EVERY 5 (FIVE) MINUTES AS NEEDED FOR CHEST PAIN.  . NONFORMULARY OR COMPOUNDED ITEM Nitro gel 0.125 % bid x 6-8 weeks.  . pantoprazole (PROTONIX) 40 MG tablet Take 1 tablet (40 mg total) by mouth 2 (two) times daily before a meal.  . predniSONE (DELTASONE) 5 MG tablet Take 10 mg by mouth daily with breakfast.  . pyridOXINE (VITAMIN B-6) 50 MG tablet Take 1 tablet (50 mg total) by mouth daily.  . rosuvastatin (CRESTOR) 40 MG tablet Take 40 mg by mouth daily.  . TRULICITY 1.5 KZ/6.0FU SOPN Inject 1.5 mg into the skin once a week.  . [DISCONTINUED] diphenoxylate-atropine (LOMOTIL) 2.5-0.025 MG tablet Take 1 tablet by mouth 4 (four) times daily as needed for diarrhea or loose stools.  . [DISCONTINUED] gabapentin (NEURONTIN) 300 MG capsule Take 1 capsule (300 mg total)  by mouth 3 (three) times daily.  . [DISCONTINUED] nitroGLYCERIN (NITROSTAT) 0.4 MG SL tablet Place 0.4 mg under the tongue every 5 (five) minutes as needed for chest pain.  . [DISCONTINUED] ondansetron (ZOFRAN ODT) 4 MG disintegrating tablet Take 1 tablet (4 mg total) by mouth every 8 (eight) hours as needed for nausea or vomiting.     Allergies  Allergen Reactions  . Fish Allergy  Hives and Shortness Of Breath  . Ivp Dye [Iodinated Diagnostic Agents] Shortness Of Breath and Anaphylaxis  . Latex Hives, Shortness Of Breath and Itching    REACTION: wheezing  . Shellfish-Derived Products Anaphylaxis and Hives  . Other     Patient reports allergy to perfumed detergents. Causes itching.  . Metformin Nausea Only    Social History   Socioeconomic History  . Marital status: Married    Spouse name: Not on file  . Number of children: Not on file  . Years of education: Not on file  . Highest education level: Not on file  Social Needs  . Financial resource strain: Not on file  . Food insecurity - worry: Not on file  . Food insecurity - inability: Not on file  . Transportation needs - medical: Not on file  . Transportation needs - non-medical: Not on file  Occupational History  . Not on file  Tobacco Use  . Smoking status: Never Smoker  . Smokeless tobacco: Never Used  Substance and Sexual Activity  . Alcohol use: No    Comment: rare glass of wine   . Drug use: No  . Sexual activity: Not on file  Other Topics Concern  . Not on file  Social History Narrative    She is a married mother of 88, grandmother of 75, great grandmother of 38.    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    She does not really get a lot of exercise,    She does not smoke or drink EtOH.     Review of Systems: General: negative for chills, fever, night sweats or weight changes.  Cardiovascular: negative for chest pain, dyspnea on exertion, edema, orthopnea, palpitations, paroxysmal nocturnal dyspnea or shortness of breath Dermatological: negative for rash Respiratory: negative for cough or wheezing Urologic: negative for hematuria Abdominal: negative for nausea, vomiting, diarrhea, bright red blood per rectum, melena, or hematemesis Neurologic: negative for visual changes, syncope, or dizziness All other systems reviewed and are otherwise negative except as noted  above.    Blood pressure 137/77, pulse 73, height 5\' 3"  (1.6 m), weight 165 lb (74.8 kg).  General appearance: alert and no distress Neck: no adenopathy, no carotid bruit, no JVD, supple, symmetrical, trachea midline and thyroid not enlarged, symmetric, no tenderness/mass/nodules Lungs: clear to auscultation bilaterally Heart: regular rate and rhythm, S1, S2 normal, no murmur, click, rub or gallop Extremities: extremities normal, atraumatic, no cyanosis or edema Pulses: 2+ and symmetric Skin: Skin color, texture, turgor normal. No rashes or lesions Neurologic: Alert and oriented X 3, normal strength and tone. Normal symmetric reflexes. Normal coordination and gait  EKG sinus rhythm at 69 without ST or T-wave changes. I personally reviewed this EKG.  ASSESSMENT AND PLAN:   PAD,severe calcified pop and tibial peroneal trunk disease bilateraly History of peripheral arterial disease status post diamondback orbital rotation atherectomy, PTCA and stenting of her distal right SFA as well as her peroneal artery using drug-eluting stents 05/16/13 by myself. I restudied her 07/20/13 with the intention of fixing  her left leg however I thought her anatomy was too complex ultimately referred her to Dr. Brunetta Jeans [facility] who performed left popliteal and tibioperoneal trunk intervention 10/01/13. Follow-up Dopplers performed 11/10/16 revealed normal ABIs bilaterally with patent SFA and tibial vessels. She complains of an area of hypersensitivity on her left pretibial area as well as a strange sensation below that which does not spell or claudication. She apparently had venous Doppler studies performed at [facility] hat showed no evidence of DVT. I can feel palpable pedal pulses on the left. I'm going to get lower extremity or chill Doppler studies to further evaluate.      Lorretta Harp MD FACP,FACC,FAHA, [facility] 03/01/2018 10:20 AM

## 2018-03-06 ENCOUNTER — Other Ambulatory Visit: Payer: Self-pay | Admitting: Cardiology

## 2018-03-10 ENCOUNTER — Ambulatory Visit (HOSPITAL_COMMUNITY)
Admission: RE | Admit: 2018-03-10 | Discharge: 2018-03-10 | Disposition: A | Discharge status: Home / Self Care | Payer: Medicare Other | Source: Ambulatory Visit | Attending: Cardiology | Admitting: Cardiology

## 2018-03-10 DIAGNOSIS — E119 Type 2 diabetes mellitus without complications: Secondary | ICD-10-CM | POA: Insufficient documentation

## 2018-03-10 DIAGNOSIS — I2510 Atherosclerotic heart disease of native coronary artery without angina pectoris: Secondary | ICD-10-CM | POA: Diagnosis not present

## 2018-03-10 DIAGNOSIS — I252 Old myocardial infarction: Secondary | ICD-10-CM | POA: Insufficient documentation

## 2018-03-10 DIAGNOSIS — I739 Peripheral vascular disease, unspecified: Secondary | ICD-10-CM | POA: Insufficient documentation

## 2018-03-10 DIAGNOSIS — Z8673 Personal history of transient ischemic attack (TIA), and cerebral infarction without residual deficits: Secondary | ICD-10-CM | POA: Diagnosis not present

## 2018-03-10 DIAGNOSIS — F17200 Nicotine dependence, unspecified, uncomplicated: Secondary | ICD-10-CM | POA: Insufficient documentation

## 2018-03-15 ENCOUNTER — Ambulatory Visit (INDEPENDENT_AMBULATORY_CARE_PROVIDER_SITE_OTHER): Payer: 59 | Admitting: Internal Medicine

## 2018-03-15 DIAGNOSIS — R7611 Nonspecific reaction to tuberculin skin test without active tuberculosis: Secondary | ICD-10-CM

## 2018-03-16 ENCOUNTER — Other Ambulatory Visit: Payer: Medicare Other

## 2018-03-16 ENCOUNTER — Other Ambulatory Visit: Payer: Self-pay | Admitting: Cardiovascular Disease

## 2018-03-16 DIAGNOSIS — R7611 Nonspecific reaction to tuberculin skin test without active tuberculosis: Secondary | ICD-10-CM | POA: Diagnosis not present

## 2018-03-16 DIAGNOSIS — Z227 Latent tuberculosis: Secondary | ICD-10-CM

## 2018-03-16 DIAGNOSIS — I739 Peripheral vascular disease, unspecified: Secondary | ICD-10-CM

## 2018-03-16 LAB — COMPREHENSIVE METABOLIC PANEL
AG RATIO: 1.5 (calc) (ref 1.0–2.5)
ALT: 49 U/L — ABNORMAL HIGH (ref 6–29)
AST: 50 U/L — AB (ref 10–35)
Albumin: 3.8 g/dL (ref 3.6–5.1)
Alkaline phosphatase (APISO): 35 U/L (ref 33–130)
BILIRUBIN TOTAL: 0.5 mg/dL (ref 0.2–1.2)
BUN: 15 mg/dL (ref 7–25)
CALCIUM: 9 mg/dL (ref 8.6–10.4)
CHLORIDE: 104 mmol/L (ref 98–110)
CO2: 32 mmol/L (ref 20–32)
CREATININE: 0.83 mg/dL (ref 0.60–0.88)
GLOBULIN: 2.5 g/dL (ref 1.9–3.7)
Glucose, Bld: 93 mg/dL (ref 65–99)
POTASSIUM: 4.2 mmol/L (ref 3.5–5.3)
Sodium: 141 mmol/L (ref 135–146)
Total Protein: 6.3 g/dL (ref 6.1–8.1)

## 2018-03-21 DIAGNOSIS — R609 Edema, unspecified: Secondary | ICD-10-CM | POA: Diagnosis not present

## 2018-03-21 DIAGNOSIS — M79643 Pain in unspecified hand: Secondary | ICD-10-CM | POA: Diagnosis not present

## 2018-03-21 DIAGNOSIS — M7989 Other specified soft tissue disorders: Secondary | ICD-10-CM | POA: Diagnosis not present

## 2018-03-21 DIAGNOSIS — M79606 Pain in leg, unspecified: Secondary | ICD-10-CM | POA: Diagnosis not present

## 2018-03-21 DIAGNOSIS — R3915 Urgency of urination: Secondary | ICD-10-CM | POA: Diagnosis not present

## 2018-03-21 DIAGNOSIS — I10 Essential (primary) hypertension: Secondary | ICD-10-CM | POA: Diagnosis not present

## 2018-03-21 DIAGNOSIS — E7800 Pure hypercholesterolemia, unspecified: Secondary | ICD-10-CM | POA: Diagnosis not present

## 2018-03-21 DIAGNOSIS — R7611 Nonspecific reaction to tuberculin skin test without active tuberculosis: Secondary | ICD-10-CM | POA: Diagnosis not present

## 2018-03-21 DIAGNOSIS — Z79899 Other long term (current) drug therapy: Secondary | ICD-10-CM | POA: Diagnosis not present

## 2018-03-21 DIAGNOSIS — M79673 Pain in unspecified foot: Secondary | ICD-10-CM | POA: Diagnosis not present

## 2018-03-21 DIAGNOSIS — M25569 Pain in unspecified knee: Secondary | ICD-10-CM | POA: Diagnosis not present

## 2018-03-21 DIAGNOSIS — R748 Abnormal levels of other serum enzymes: Secondary | ICD-10-CM | POA: Diagnosis not present

## 2018-03-21 DIAGNOSIS — M1990 Unspecified osteoarthritis, unspecified site: Secondary | ICD-10-CM | POA: Diagnosis not present

## 2018-03-21 DIAGNOSIS — R945 Abnormal results of liver function studies: Secondary | ICD-10-CM | POA: Diagnosis not present

## 2018-03-28 ENCOUNTER — Ambulatory Visit (INDEPENDENT_AMBULATORY_CARE_PROVIDER_SITE_OTHER): Payer: Medicare Other | Admitting: Internal Medicine

## 2018-03-28 ENCOUNTER — Encounter: Payer: Self-pay | Admitting: Internal Medicine

## 2018-03-28 DIAGNOSIS — R7611 Nonspecific reaction to tuberculin skin test without active tuberculosis: Secondary | ICD-10-CM

## 2018-03-28 DIAGNOSIS — R197 Diarrhea, unspecified: Secondary | ICD-10-CM | POA: Diagnosis not present

## 2018-03-28 DIAGNOSIS — Z227 Latent tuberculosis: Secondary | ICD-10-CM

## 2018-03-28 NOTE — Assessment & Plan Note (Signed)
Not surprisingly she has developed slight elevation of her liver transaminases after restarting isoniazid 6 weeks ago.  She feels like she can continue it for now.  We will have her return in 4 weeks for repeat blood work.

## 2018-03-28 NOTE — Assessment & Plan Note (Signed)
Because of her recurrent diarrhea is unclear.  Last time it developed while she was taking rifampin, this time she is on isoniazid.  Her C. difficile was negative in February.  I will recheck it now.

## 2018-03-28 NOTE — Progress Notes (Signed)
[facility] for Infectious Disease  Patient Active Problem List   Diagnosis Date Noted  . Diarrhea 01/31/2018    Priority: High  . Latent tuberculosis by blood test 10/13/2017    Priority: High  . Inflammatory arthritis 10/13/2017    Priority: Medium  . Anal fissure 11/10/2017  . Rectal bleeding 11/10/2017  . Rectal pain 11/10/2017  . Chest pain with high risk for cardiac etiology 08/11/2017  . Chronic diastolic CHF (congestive heart failure), NYHA class 2 ([place]) 08/11/2017  . Fatigue due to treatment 01/09/2017  . NSTEMI (non-ST elevated myocardial infarction) ([place]) 11/01/2016  . Diabetes mellitus ([place]) 10/24/2015  . Non-insulin dependent type 2 diabetes mellitus ([place]) 10/24/2015  . Angina, class I ([place]) 04/22/2015  . DOE (dyspnea on exertion) 04/22/2015  . Leg edema, left 04/22/2015  . Accumulation of fluid in tissues 03/21/2014  . Vertigo, central 01/02/2014  . Vertigo 01/02/2014  . Limb pain- echymosis, pain Lt radial artery after cath 12/11/2013  . Chest pain with low risk for cardiac etiology- patent grafts on cath 11/26/13; ? microvascular ischemia 11/28/2013  . Dyslipidemia, goal LDL below 70 05/17/2013  . Accelerated hypertension with diastolic congestive heart failure, NYHA class 3 ([place]) 05/17/2013  . Claudication ([place]) 05/16/2013  . PTA/ Stent Rt popliteal and Rt peroneal artery 05/16/13 05/16/2013  . PAD,severe calcified pop and tibial peroneal trunk disease bilateraly   . ABDOMINAL PAIN RIGHT LOWER QUADRANT 10/07/2010  . CVA-STROKE 03/04/2010  . BARRETT'S ESOPHAGUS 03/04/2010  . FECAL INCONTINENCE 03/04/2010  . DM 01/14/2010  . ANEMIA OF CHRONIC DISEASE 01/14/2010  . Atherosclerotic heart disease of native coronary artery with angina pectoris ([place]) 01/14/2010  . DIVERTICULOSIS OF COLON 01/14/2010  . PERSONAL HISTORY OF COLONIC POLYPS 01/14/2010  . SHELLFISH ALLERGY 01/14/2010    Patient's Medications  New Prescriptions   No medications on file   Previous Medications   CARVEDILOL (COREG) 3.125 MG TABLET    Take 1 tablet (3.125 mg total) by mouth 2 (two) times daily.   CLOPIDOGREL (PLAVIX) 75 MG TABLET    Take 75 mg by mouth daily.   COENZYME Q10 (CO Q 10 PO)    Take 1 tablet by mouth at bedtime.    EZETIMIBE (ZETIA) 10 MG TABLET    Take 10 mg by mouth daily.   GABAPENTIN (NEURONTIN) 300 MG CAPSULE    Take 1 capsule (300 mg total) by mouth 3 (three) times daily.   HYDROXYCHLOROQUINE (PLAQUENIL) 200 MG TABLET    Take 400 mg by mouth daily.   ISONIAZID (NYDRAZID) 300 MG TABLET    Take 1 tablet (300 mg total) by mouth daily.   ISOSORBIDE MONONITRATE (IMDUR) 60 MG 24 HR TABLET    Take 60 mg by mouth daily.   ISOSORBIDE MONONITRATE (IMDUR) 60 MG 24 HR TABLET    TAKE 1 TABLET (60 MG TOTAL) BY MOUTH DAILY.   NAPROXEN SODIUM (ANAPROX) 220 MG TABLET    Take 440 mg by mouth 2 (two) times daily as needed.   NITROGLYCERIN (NITROSTAT) 0.4 MG SL TABLET    PLACE 1 TABLET (0.4 MG TOTAL) UNDER THE TONGUE EVERY 5 (FIVE) MINUTES AS NEEDED FOR CHEST PAIN.   NONFORMULARY OR COMPOUNDED ITEM    Nitro gel 0.125 % bid x 6-8 weeks.   PANTOPRAZOLE (PROTONIX) 40 MG TABLET    Take 1 tablet (40 mg total) by mouth 2 (two) times daily before a meal.   PREDNISONE (DELTASONE) 5 MG TABLET  Take 7.5 mg by mouth daily with breakfast.    PYRIDOXINE (VITAMIN B-6) 50 MG TABLET    Take 1 tablet (50 mg total) by mouth daily.   ROSUVASTATIN (CRESTOR) 40 MG TABLET    Take 40 mg by mouth daily.   TRULICITY 1.5 BV/6.9IH SOPN    Inject 1.5 mg into the skin once a week.  Modified Medications   No medications on file  Discontinued Medications   No medications on file    Subjective: [name] is in for her routine follow-up visit.  She was diagnosed with latent tuberculosis last fall.  She started isoniazid in early November.  She had a gradual increase in her liver transaminases to just over 3 times the upper limit of normal by late January.  She was not symptomatic but we elected to  change her to rifampin.  Her liver enzymes normalized quickly off of isoniazid.  On rifampin she developed nausea, vomiting and diarrhea.  She stopped it in late February and felt better fairly quickly.  We decided to have her restart isoniazid early last month.  She did well until several days ago when she developed lower abdominal pain and watery diarrhea again.  She is currently on prednisone 7.5 mg daily and hydroxychloroquine for her rheumatoid arthritis.  Review of Systems: Review of Systems  Constitutional: Negative for chills, diaphoresis, fever and weight loss.  Respiratory: Positive for cough. Negative for sputum production and shortness of breath.   Gastrointestinal: Positive for abdominal pain and diarrhea. Negative for nausea and vomiting.    Past Medical History:  Diagnosis Date  . Atherosclerotic heart disease native coronary artery w/angina pectoris ([place]) 1992   a. 1992 s/p PTCA of LAD;  b. 1998 CABG x 3; c. 07/2001 Cath: Sev LM/LAD/LCX dzs, 3/3 patent grafts; d. 11/2013 Cath: stable graft anatomy; e. 10/2016 Cath: native 3VD, VG->OM nl, LIMA->LAD nl, VG->Diag 55.  . Bradycardia, mild briefly to the 40s, asymptomatic 05/16/2013  . Carotid arterial disease ([place])    a. 05/2014 Carotid U/S: No signif bilat ICA stenosis, >60% L ECA.  Marland Kitchen Chronic anemia   . Chronic diastolic CHF (congestive heart failure) ([place])    a. 10/2016 Echo: Ef 55-60%, no rwma, Gr1 DD, triv AI, PASP 29mmHg, atrial septal aneurysm.  . Chronic diastolic CHF (congestive heart failure), NYHA class 2 ([place]) 08/11/2017  . DM (diabetes mellitus), type 2 with peripheral vascular complications ([facility])    On Oral medications.  . Essential hypertension   . GERD (gastroesophageal reflux disease)   . Hyperlipidemia with target LDL less than 70   . PAD (peripheral artery disease) ([place]) 05/2013; 09/2013   a. severe calcified POP-1 & TP Trunk Dz bilat;  b . 05/2013 Diamondback Rot Athrectomy (R POP) --> PTA R Pop, DES to R Peroneal;   c. 09/2013 PTA/Stenting of L Pop [name] - retrograde access);  d. 04/2014 ABI's: R/L: 1.1/1.1.  Marland Kitchen Peptic ulcer disease   . S/P CABG x 3 1998   LIMA-LAD, SVG-OM, SVG-D1  . Severe claudication ([place]) 05/2013   Referred for Peripheral Angio (Dr. Gwenlyn Found --> Dr. Brunetta Jeans in [place])    Social History   Tobacco Use  . Smoking status: Never Smoker  . Smokeless tobacco: Never Used  Substance Use Topics  . Alcohol use: No    Comment: rare glass of wine   . Drug use: No    Family History  Problem Relation Age of Onset  . Hypertension Mother   . Hyperlipidemia Mother   .  Hyperlipidemia Father   . Hypertension Father     Allergies  Allergen Reactions  . Fish Allergy Hives and Shortness Of Breath  . Ivp Dye [Iodinated Diagnostic Agents] Shortness Of Breath and Anaphylaxis  . Latex Hives, Shortness Of Breath, Itching and Anaphylaxis    REACTION: wheezing  . Shellfish Allergy Hives, Shortness Of Breath and Anaphylaxis    All seafood  . Shellfish-Derived Products Anaphylaxis and Hives  . Other     Patient reports allergy to perfumed detergents. Causes itching.  . Metformin Nausea Only    Objective: Vitals:   03/28/18 1346  BP: 108/66  Pulse: 70  Temp: 98 F (36.7 C)  TempSrc: Oral  Weight: 165 lb (74.8 kg)  Height: 5\' 3"  (1.6 m)   Body mass index is 29.23 kg/m.  Physical Exam  Constitutional: She is oriented to person, place, and time.  She is worried.  Cardiovascular: Normal rate and regular rhythm.  No murmur heard. Pulmonary/Chest: Effort normal. She has no wheezes. She has no rales.  Abdominal: Soft. There is no tenderness. There is no rebound.  Neurological: She is alert and oriented to person, place, and time.  Skin: No rash noted.  Psychiatric: Mood and affect normal.    Lab Results CMP     Component Value Date/Time   NA 141 03/16/2018 1443   NA 139 01/14/2017 1302   K 4.2 03/16/2018 1443   CL 104 03/16/2018 1443   CO2 32 03/16/2018 1443   GLUCOSE 93  03/16/2018 1443   BUN 15 03/16/2018 1443   BUN 15 01/14/2017 1302   CREATININE 0.83 03/16/2018 1443   CALCIUM 9.0 03/16/2018 1443   PROT 6.3 03/16/2018 1443   PROT 7.3 05/19/2017 1254   ALBUMIN 3.6 08/12/2017 0541   ALBUMIN 4.4 05/19/2017 1254   AST 50 (H) 03/16/2018 1443   ALT 49 (H) 03/16/2018 1443   ALKPHOS 42 08/12/2017 0541   BILITOT 0.5 03/16/2018 1443   BILITOT 0.3 05/19/2017 1254   GFRNONAA 56 (L) 08/12/2017 0541   GFRNONAA 53 (L) 04/26/2013 1415   GFRAA >60 08/12/2017 0541   GFRAA 61 04/26/2013 1415     Problem List Items Addressed This Visit      High   Diarrhea    Because of her recurrent diarrhea is unclear.  Last time it developed while she was taking rifampin, this time she is on isoniazid.  Her C. difficile was negative in February.  I will recheck it now.      Relevant Orders   Clostridium Difficile by PCR   Latent tuberculosis by blood test    Not surprisingly she has developed slight elevation of her liver transaminases after restarting isoniazid 6 weeks ago.  She feels like she can continue it for now.  We will have her return in 4 weeks for repeat blood work.          Michel Bickers, MD [facility] [phone] pager   [phone] cell 03/28/2018, 2:12 PM

## 2018-04-03 ENCOUNTER — Other Ambulatory Visit: Payer: Medicare Other

## 2018-04-03 DIAGNOSIS — R197 Diarrhea, unspecified: Secondary | ICD-10-CM | POA: Diagnosis not present

## 2018-04-03 NOTE — Progress Notes (Unsigned)
C Diff orders per last note. Landis Gandy, RN

## 2018-04-04 LAB — TIQ-NTM

## 2018-04-04 LAB — CLOSTRIDIUM DIFFICILE TOXIN B, QUALITATIVE, REAL-TIME PCR: CDIFFPCR: NOT DETECTED

## 2018-04-06 DIAGNOSIS — I10 Essential (primary) hypertension: Secondary | ICD-10-CM | POA: Diagnosis not present

## 2018-04-06 DIAGNOSIS — M25551 Pain in right hip: Secondary | ICD-10-CM | POA: Diagnosis not present

## 2018-04-06 DIAGNOSIS — R609 Edema, unspecified: Secondary | ICD-10-CM | POA: Diagnosis not present

## 2018-04-06 DIAGNOSIS — E789 Disorder of lipoprotein metabolism, unspecified: Secondary | ICD-10-CM | POA: Diagnosis not present

## 2018-04-06 DIAGNOSIS — E118 Type 2 diabetes mellitus with unspecified complications: Secondary | ICD-10-CM | POA: Diagnosis not present

## 2018-04-06 DIAGNOSIS — Z79899 Other long term (current) drug therapy: Secondary | ICD-10-CM | POA: Diagnosis not present

## 2018-04-06 DIAGNOSIS — M1611 Unilateral primary osteoarthritis, right hip: Secondary | ICD-10-CM | POA: Diagnosis not present

## 2018-04-20 ENCOUNTER — Ambulatory Visit (HOSPITAL_COMMUNITY)
Admission: EM | Admit: 2018-04-20 | Discharge: 2018-04-20 | Disposition: A | Discharge status: Home / Self Care | Payer: Medicare Other | Attending: Emergency Medicine | Admitting: Emergency Medicine

## 2018-04-20 ENCOUNTER — Encounter (HOSPITAL_COMMUNITY): Payer: Self-pay | Admitting: Emergency Medicine

## 2018-04-20 DIAGNOSIS — I5032 Chronic diastolic (congestive) heart failure: Secondary | ICD-10-CM | POA: Diagnosis not present

## 2018-04-20 DIAGNOSIS — I110 Hypertensive heart disease with heart failure: Secondary | ICD-10-CM | POA: Insufficient documentation

## 2018-04-20 DIAGNOSIS — Z951 Presence of aortocoronary bypass graft: Secondary | ICD-10-CM | POA: Insufficient documentation

## 2018-04-20 DIAGNOSIS — E1151 Type 2 diabetes mellitus with diabetic peripheral angiopathy without gangrene: Secondary | ICD-10-CM | POA: Insufficient documentation

## 2018-04-20 DIAGNOSIS — Z79899 Other long term (current) drug therapy: Secondary | ICD-10-CM | POA: Diagnosis not present

## 2018-04-20 DIAGNOSIS — I2510 Atherosclerotic heart disease of native coronary artery without angina pectoris: Secondary | ICD-10-CM | POA: Insufficient documentation

## 2018-04-20 DIAGNOSIS — K219 Gastro-esophageal reflux disease without esophagitis: Secondary | ICD-10-CM | POA: Diagnosis not present

## 2018-04-20 DIAGNOSIS — Z8249 Family history of ischemic heart disease and other diseases of the circulatory system: Secondary | ICD-10-CM | POA: Diagnosis not present

## 2018-04-20 DIAGNOSIS — R1031 Right lower quadrant pain: Secondary | ICD-10-CM | POA: Diagnosis not present

## 2018-04-20 DIAGNOSIS — Z7952 Long term (current) use of systemic steroids: Secondary | ICD-10-CM | POA: Diagnosis not present

## 2018-04-20 DIAGNOSIS — Z7902 Long term (current) use of antithrombotics/antiplatelets: Secondary | ICD-10-CM | POA: Insufficient documentation

## 2018-04-20 DIAGNOSIS — E785 Hyperlipidemia, unspecified: Secondary | ICD-10-CM | POA: Diagnosis not present

## 2018-04-20 DIAGNOSIS — R300 Dysuria: Secondary | ICD-10-CM

## 2018-04-20 DIAGNOSIS — I252 Old myocardial infarction: Secondary | ICD-10-CM | POA: Insufficient documentation

## 2018-04-20 LAB — POCT URINALYSIS DIP (DEVICE)
Bilirubin Urine: NEGATIVE
Glucose, UA: NEGATIVE mg/dL
Ketones, ur: NEGATIVE mg/dL
NITRITE: POSITIVE — AB
Protein, ur: NEGATIVE mg/dL
Urobilinogen, UA: 0.2 mg/dL (ref 0.0–1.0)
pH: 5.5 (ref 5.0–8.0)

## 2018-04-20 MED ORDER — CEPHALEXIN 500 MG PO CAPS: 500.0000 mg | ORAL_CAPSULE | Freq: Four times a day (QID) | ORAL | 0 refills | Status: AC

## 2018-04-20 NOTE — ED Triage Notes (Signed)
PT reports pain over right groin area for a few days. PT describes a "knot" that she pushed on and it "went in." No history of hernia. Painful urination today and sometimes pain after BMs

## 2018-04-20 NOTE — Discharge Instructions (Signed)
Please take keflex every 6 hours for 5 days  Continue tramadol for pain

## 2018-04-21 NOTE — ED Provider Notes (Signed)
[facility]    CSN: [id number] Arrival date & time: 04/20/18  1556     History   Chief Complaint Chief Complaint  Patient presents with  . Groin Pain    HPI [name] is a 84 y.o. female history of CAD, diastolic heart failure, hypertension, hyperlipidemia and peripheral arterial disease presenting today for evaluation of a pain in her lower abdomen/right groin.  Patient states that this is been present for a couple of weeks, but pain has worsened and become more frequent.  She states that she feels a knot sensation and that she feels like she can push it in.  Denies any increase in bulging with straining or coughing.  Pain will come and go, but is been more persistent recently.  States that it feels better when she applies pressure with her hand.  Has been taking Aleve as well as tramadol with minimal relief.  Patient saw her PCP for this previously who believed this to be hip pain.  Denies any nausea or vomiting.  Eating and drinking like normal.  Denies any diarrhea or constipation, but states that she did develop slight discomfort and pain after bowel movements yesterday.  States that they were slightly harder, but they returned to being soft.  Denies any bleeding with bowel movements.  Does note that she developed some dysuria yesterday and with increased frequency.  Patient endorsing hysterectomy, she is unsure if she still has her ovaries.  HPI  Past Medical History:  Diagnosis Date  . Atherosclerotic heart disease native coronary artery w/angina pectoris ([place]) 1992   a. 1992 s/p PTCA of LAD;  b. 1998 CABG x 3; c. 07/2001 Cath: Sev LM/LAD/LCX dzs, 3/3 patent grafts; d. 11/2013 Cath: stable graft anatomy; e. 10/2016 Cath: native 3VD, VG->OM nl, LIMA->LAD nl, VG->Diag 55.  . Bradycardia, mild briefly to the 40s, asymptomatic 05/16/2013  . Carotid arterial disease ([place])    a. 05/2014 Carotid U/S: No signif bilat ICA stenosis, >60% L ECA.  Marland Kitchen Chronic anemia   . Chronic  diastolic CHF (congestive heart failure) ([place])    a. 10/2016 Echo: Ef 55-60%, no rwma, Gr1 DD, triv AI, PASP 14mmHg, atrial septal aneurysm.  . Chronic diastolic CHF (congestive heart failure), NYHA class 2 ([place]) 08/11/2017  . DM (diabetes mellitus), type 2 with peripheral vascular complications ([facility])    On Oral medications.  . Essential hypertension   . GERD (gastroesophageal reflux disease)   . Hyperlipidemia with target LDL less than 70   . PAD (peripheral artery disease) ([place]) 05/2013; 09/2013   a. severe calcified POP-1 & TP Trunk Dz bilat;  b . 05/2013 Diamondback Rot Athrectomy (R POP) --> PTA R Pop, DES to R Peroneal;  c. 09/2013 PTA/Stenting of L Pop [name] - retrograde access);  d. 04/2014 ABI's: R/L: 1.1/1.1.  Marland Kitchen Peptic ulcer disease   . S/P CABG x 3 1998   LIMA-LAD, SVG-OM, SVG-D1  . Severe claudication ([place]) 05/2013   Referred for Peripheral Angio (Dr. Gwenlyn Found --> Dr. Brunetta Jeans in [place])    Patient Active Problem List   Diagnosis Date Noted  . Diarrhea 01/31/2018  . Anal fissure 11/10/2017  . Rectal bleeding 11/10/2017  . Rectal pain 11/10/2017  . Inflammatory arthritis 10/13/2017  . Latent tuberculosis by blood test 10/13/2017  . Chest pain with high risk for cardiac etiology 08/11/2017  . Chronic diastolic CHF (congestive heart failure), NYHA class 2 ([place]) 08/11/2017  . Fatigue due to treatment 01/09/2017  . NSTEMI (  non-ST elevated myocardial infarction) ([place]) 11/01/2016  . Diabetes mellitus ([place]) 10/24/2015  . Non-insulin dependent type 2 diabetes mellitus ([place]) 10/24/2015  . Angina, class I ([place]) 04/22/2015  . DOE (dyspnea on exertion) 04/22/2015  . Leg edema, left 04/22/2015  . Accumulation of fluid in tissues 03/21/2014  . Vertigo, central 01/02/2014  . Vertigo 01/02/2014  . Limb pain- echymosis, pain Lt radial artery after cath 12/11/2013  . Chest pain with low risk for cardiac etiology- patent grafts on cath 11/26/13; ? microvascular ischemia 11/28/2013  .  Dyslipidemia, goal LDL below 70 05/17/2013  . Accelerated hypertension with diastolic congestive heart failure, NYHA class 3 ([place]) 05/17/2013  . Claudication ([place]) 05/16/2013  . PTA/ Stent Rt popliteal and Rt peroneal artery 05/16/13 05/16/2013  . PAD,severe calcified pop and tibial peroneal trunk disease bilateraly   . ABDOMINAL PAIN RIGHT LOWER QUADRANT 10/07/2010  . CVA-STROKE 03/04/2010  . BARRETT'S ESOPHAGUS 03/04/2010  . FECAL INCONTINENCE 03/04/2010  . DM 01/14/2010  . ANEMIA OF CHRONIC DISEASE 01/14/2010  . Atherosclerotic heart disease of native coronary artery with angina pectoris ([place]) 01/14/2010  . DIVERTICULOSIS OF COLON 01/14/2010  . PERSONAL HISTORY OF COLONIC POLYPS 01/14/2010  . [place] ALLERGY 01/14/2010    Past Surgical History:  Procedure Laterality Date  . ATHERECTOMY Right 05/15/2013   Procedure: ATHERECTOMY;  Surgeon: Lorretta Harp, MD;  Location: [facility] CATH LAB;  Service: Cardiovascular;  Laterality: Right;  popliteal  . BACK SURGERY    . CARDIAC CATHETERIZATION  07/17/01   2 v CAD with LM, circ, LAD, obtuse diag, mod stenosis of diag vein graft , mild stenosis of marg vein graft, nl EF, medical treatment  . CARDIAC CATHETERIZATION  11/2013   Widely patent LIMA-LAD & SVG-OM with mild progression of proximal stenosis ~40-50% in SVG-D1; Widely patent native RCA with known severe native LCA disease  . CARDIAC CATHETERIZATION N/A 11/01/2016   Procedure: Left Heart Cath and Cors/Grafts Angiography;  Surgeon: [name], MD;  Location: [facility] CV LAB;  Service: Cardiovascular: Hemodynamics: High LVEDP!.  Cors/Grafts: LM 55%, o-p LAD 100% then after D1 -> LIMA-LAD patent, SVG-Diag ~55%. small RI wiht ~70% ostial. O-p Cx 80% - pCx 70% --> SVG-bifurcating OM patent. -- med Rx  . CORONARY ANGIOPLASTY  1992   PCI to LAD  . CORONARY ARTERY BYPASS GRAFT  1998   LIMA-LAD, SVG-diagonal (none roughly 50% stenosis), SVG-OM  . LEFT HEART CATHETERIZATION WITH CORONARY  ANGIOGRAM N/A 11/27/2013   Procedure: LEFT HEART CATHETERIZATION WITH CORONARY ANGIOGRAM;  Surgeon: [name], MD;  Location: [facility] CATH LAB;  Service: Cardiovascular;  Laterality: N/A;  . LOW EXTREMITY DOPPLERS/ABI  10/2016   Patent right SFA, popliteal and peroneal tendons. Patent left SFA and popliteal stent. 30-50% stenosis in the right common femoral and popliteal arteries, 50-74% stenosis in left profunda femoris artery. --> 1 year follow-up.  RABI - 1.2, LABI 1.1  . LOWER EXTREMITY ANGIOGRAM N/A 02/22/2013   Procedure: LOWER EXTREMITY ANGIOGRAM;  Surgeon: Lorretta Harp, MD;  Location: [facility] CATH LAB;  Service: Cardiovascular;  Laterality: N/A;  . LOWER EXTREMITY ANGIOGRAM N/A 07/20/2013   Procedure: LOWER EXTREMITY ANGIOGRAM;  Surgeon: Lorretta Harp, MD;  Location: [facility] CATH LAB;  Service: Cardiovascular;  Laterality: N/A;  . NM MYOVIEW LTD  04/03/2013; 5/26'/2016   Lexiscan: a)  Apical perfusion defect with no ischemia. Consider prior infarct versus breast attenuation.;; b) 5/'16: LOW RISK, Nl EF ~55-65%, No Ischemia /Infarction  . NM MYOVIEW LTD  08/12/2017  Multiple low RISK. Normal study. No EKG changes. 1.5 mm T wave inversions noted in V2-V4 that began 1 minute into stress. -Ended 3 minutes into rest.  EF 55-65%  . PERCUTANEOUS STENT INTERVENTION Right 05/15/2013   Procedure: PERCUTANEOUS STENT INTERVENTION;  Surgeon: Lorretta Harp, MD;  Location: [facility] CATH LAB;  Service: Cardiovascular;  Laterality: Right;  tibioperoneal trunk  . Peroneal Artery Stent  05/15/13   Diamondback rot. atherectomy of high grade segmental popliteal stenosis then Chocolate balloonPTA; then angiosculpt PTA of the prox peroneal with stent-Xpedition   . pv angio  07/20/13   high-grade calcified popliteal disease with one-vessel runoff via a small anterior tibial artery that has proximal disease as well, no intervention  . TRANSTHORACIC ECHOCARDIOGRAM  10/2016   EF 55-60%. Gr 1 DD. Normal PAP. Aortic Sclerosis.  .  TRANSTHORACIC ECHOCARDIOGRAM  02/2011; 04/2015   a) EF 50-55%, mild inf HK;; b)  Nl LV Size & Fxn EF 60-65%. mild LVH, Gr 1 DD.  . TUBAL LIGATION      OB History   None      Home Medications    Prior to Admission medications   Medication Sig Start Date End Date Taking? Authorizing Provider  carvedilol (COREG) 3.125 MG tablet Take 1 tablet (3.125 mg total) by mouth 2 (two) times daily. 05/16/17 08/14/17  [name], MD  cephALEXin (KEFLEX) 500 MG capsule Take 1 capsule (500 mg total) by mouth 4 (four) times daily for 5 days. 04/20/18 04/25/18  Wieters, Hallie C, PA-C  clopidogrel (PLAVIX) 75 MG tablet Take 75 mg by mouth daily.    [provider]  Coenzyme Q10 (CO Q 10 PO) Take 1 tablet by mouth at bedtime.     [provider]  ezetimibe (ZETIA) 10 MG tablet Take 10 mg by mouth daily.    [provider]  gabapentin (NEURONTIN) 300 MG capsule Take 1 capsule (300 mg total) by mouth 3 (three) times daily. 02/20/18   Wallene Huh, DPM  hydroxychloroquine (PLAQUENIL) 200 MG tablet Take 400 mg by mouth daily.    [provider]  isoniazid (NYDRAZID) 300 MG tablet Take 1 tablet (300 mg total) by mouth daily. 02/13/18   Michel Bickers, MD  isosorbide mononitrate (IMDUR) 60 MG 24 hr tablet Take 60 mg by mouth daily.    [provider]  isosorbide mononitrate (IMDUR) 60 MG 24 hr tablet TAKE 1 TABLET (60 MG TOTAL) BY MOUTH DAILY. 03/06/18   [name], MD  naproxen sodium (ANAPROX) 220 MG tablet Take 440 mg by mouth 2 (two) times daily as needed.    [provider]  nitroGLYCERIN (NITROSTAT) 0.4 MG SL tablet PLACE 1 TABLET (0.4 MG TOTAL) UNDER THE TONGUE EVERY 5 (FIVE) MINUTES AS NEEDED FOR CHEST PAIN. 02/13/18 05/14/18  Theora Gianotti, NP  NONFORMULARY OR COMPOUNDED ITEM Nitro gel 0.125 % bid x 6-8 weeks. 11/10/17   Zehr, Laban Emperor, PA-C  pantoprazole (PROTONIX) 40 MG tablet Take 1 tablet (40 mg total) by mouth 2 (two) times daily before  a meal. 08/13/17   Daune Perch, NP  predniSONE (DELTASONE) 5 MG tablet Take 7.5 mg by mouth daily with breakfast.     [provider]  pyridOXINE (VITAMIN B-6) 50 MG tablet Take 1 tablet (50 mg total) by mouth daily. 02/13/18   Michel Bickers, MD  rosuvastatin (CRESTOR) 40 MG tablet Take 40 mg by mouth daily.    [provider]  TRULICITY 1.5 ON/6.2XB SOPN Inject  1.5 mg into the skin once a week. 09/13/16   [provider]    Family History Family History  Problem Relation Age of Onset  . Hypertension Mother   . Hyperlipidemia Mother   . Hyperlipidemia Father   . Hypertension Father     Social History Social History   Tobacco Use  . Smoking status: Never Smoker  . Smokeless tobacco: Never Used  Substance Use Topics  . Alcohol use: No    Comment: rare glass of wine   . Drug use: No     Allergies   Fish allergy; Ivp dye [iodinated diagnostic agents]; Latex; Shellfish allergy; Shellfish-derived products; Other; and Metformin   Review of Systems Review of Systems  Constitutional: Negative for appetite change, fatigue and fever.  Respiratory: Negative for shortness of breath.   Cardiovascular: Negative for chest pain.  Gastrointestinal: Positive for abdominal pain. Negative for blood in stool, diarrhea, nausea and vomiting.  Genitourinary: Positive for dysuria. Negative for flank pain, genital sores, hematuria, menstrual problem, vaginal bleeding, vaginal discharge and vaginal pain.  Musculoskeletal: Negative for back pain and myalgias.  Skin: Negative for rash.  Neurological: Negative for dizziness, weakness, light-headedness and headaches.     Physical Exam Triage Vital Signs ED Triage Vitals  Enc Vitals Group     BP 04/20/18 1637 (!) 148/57     Pulse Rate 04/20/18 1637 70     Resp 04/20/18 1637 16     Temp 04/20/18 1637 98.5 F (36.9 C)     Temp Source 04/20/18 1637 Oral     SpO2 04/20/18 1637 99 %     Weight 04/20/18 1638 162 lb (73.5  kg)     Height --      Head Circumference --      Peak Flow --      Pain Score 04/20/18 1637 8     Pain Loc --      Pain Edu? --      Excl. in [place]? --    No data found.  Updated Vital Signs BP (!) 148/57   Pulse 70   Temp 98.5 F (36.9 C) (Oral)   Resp 16   Wt 162 lb (73.5 kg)   SpO2 99%   BMI 28.70 kg/m   Visual Acuity Right Eye Distance:   Left Eye Distance:   Bilateral Distance:    Right Eye Near:   Left Eye Near:    Bilateral Near:     Physical Exam  Constitutional: She appears well-developed and well-nourished. No distress.  HENT:  Head: Normocephalic and atraumatic.  Eyes: Conjunctivae are normal.  Neck: Neck supple.  Cardiovascular: Normal rate and regular rhythm.  No murmur heard. Pulmonary/Chest: Effort normal and breath sounds normal. No respiratory distress.  Abdominal: Soft. There is tenderness.  Patient with scar to lower abdomen midline extending to suprapubic area, tenderness to palpation just to the right of the scar, small hardend knot-like sensation to abdomen, pain worsens as pressing down on this area.  Area localized to lower abdomen, negative McBurney's.  Area does not increase in size and no bulges palpated with cough/Valsalva.  Genitourinary:  Genitourinary Comments: No fissure or hemorrhoid visualized on external rectal exam  Musculoskeletal: She exhibits no edema.  Neurological: She is alert.  Skin: Skin is warm and dry.  Psychiatric: She has a normal mood and affect.  Nursing note and vitals reviewed.    UC Treatments / Results  Labs (all labs ordered are listed, but only abnormal results  are displayed) Labs Reviewed  POCT URINALYSIS DIP (DEVICE) - Abnormal; Notable for the following components:      Result Value   Hgb urine dipstick TRACE (*)    Nitrite POSITIVE (*)    Leukocytes, UA TRACE (*)    All other components within normal limits  URINE CULTURE    EKG None  Radiology No results found.  Procedures Procedures  (including critical care time)  Medications Ordered in UC Medications - No data to display  Initial Impression / Assessment and Plan / UC Course  I have reviewed the triage vital signs and the nursing notes.  Pertinent labs & imaging results that were available during my care of the patient were reviewed by me and considered in my medical decision making (see chart for details).     Patient with positive nitrite and trace leuks on UA, will treat for UTI with Keflex.  Abdominal discomfort began prior to onset of dysuria, feel this is a separate issue.  Patient does have significant tenderness and discomfort.  Vital signs stable without fever or tachycardia.  Given that this is persisting for a week and a half now, feel this is less likely an acute appendicitis.  She is also tolerating oral intake without any changes in pain and symptoms seem less likely related to GI tract.  Advised if pain is tolerable, to follow-up with PCP for further evaluation outpatient; advised to go to emergency room if she has any worsening abdominal pain or if the pain is not tolerable..   Final Clinical Impressions(s) / UC Diagnoses   Final diagnoses:  Dysuria  Acute right lower quadrant pain     Discharge Instructions     Please take keflex every 6 hours for 5 days  Continue tramadol for pain   ED Prescriptions    Medication Sig Dispense Auth. Provider   cephALEXin (KEFLEX) 500 MG capsule Take 1 capsule (500 mg total) by mouth 4 (four) times daily for 5 days. 20 capsule Wieters, Hallie C, PA-C     Controlled Substance Prescriptions [place] Controlled Substance Registry consulted? Not Applicable   Janith Lima, [place] 04/21/18 [id number]

## 2018-04-22 LAB — URINE CULTURE

## 2018-04-24 ENCOUNTER — Telehealth (HOSPITAL_COMMUNITY): Payer: Self-pay

## 2018-04-24 DIAGNOSIS — M25551 Pain in right hip: Secondary | ICD-10-CM | POA: Diagnosis not present

## 2018-04-24 DIAGNOSIS — I10 Essential (primary) hypertension: Secondary | ICD-10-CM | POA: Diagnosis not present

## 2018-04-24 DIAGNOSIS — E789 Disorder of lipoprotein metabolism, unspecified: Secondary | ICD-10-CM | POA: Diagnosis not present

## 2018-04-24 DIAGNOSIS — Z87440 Personal history of urinary (tract) infections: Secondary | ICD-10-CM | POA: Diagnosis not present

## 2018-04-24 DIAGNOSIS — Z79899 Other long term (current) drug therapy: Secondary | ICD-10-CM | POA: Diagnosis not present

## 2018-04-24 NOTE — Telephone Encounter (Signed)
Urine culture is positive for E.Coli, this was treated with Keflex at recent visit.

## 2018-04-27 ENCOUNTER — Ambulatory Visit (INDEPENDENT_AMBULATORY_CARE_PROVIDER_SITE_OTHER): Payer: Medicare Other | Admitting: Internal Medicine

## 2018-04-27 DIAGNOSIS — Z227 Latent tuberculosis: Secondary | ICD-10-CM

## 2018-04-27 DIAGNOSIS — R7611 Nonspecific reaction to tuberculin skin test without active tuberculosis: Secondary | ICD-10-CM

## 2018-04-27 NOTE — Progress Notes (Signed)
[facility]  Patient Active Problem List   Diagnosis Date Noted  . Diarrhea 01/31/2018    Priority: High  . Latent tuberculosis by blood test 10/13/2017    Priority: High  . Inflammatory arthritis 10/13/2017    Priority: Medium  . Anal fissure 11/10/2017  . Rectal bleeding 11/10/2017  . Rectal pain 11/10/2017  . Chest pain with high risk for cardiac etiology 08/11/2017  . Chronic diastolic CHF (congestive heart failure), NYHA class 2 ([place]) 08/11/2017  . Fatigue due to treatment 01/09/2017  . NSTEMI (non-ST elevated myocardial infarction) ([place]) 11/01/2016  . Diabetes mellitus ([place]) 10/24/2015  . Non-insulin dependent type 2 diabetes mellitus ([place]) 10/24/2015  . Angina, class I ([place]) 04/22/2015  . DOE (dyspnea on exertion) 04/22/2015  . Leg edema, left 04/22/2015  . Accumulation of fluid in tissues 03/21/2014  . Vertigo, central 01/02/2014  . Vertigo 01/02/2014  . Limb pain- echymosis, pain Lt radial artery after cath 12/11/2013  . Chest pain with low risk for cardiac etiology- patent grafts on cath 11/26/13; ? microvascular ischemia 11/28/2013  . Dyslipidemia, goal LDL below 70 05/17/2013  . Accelerated hypertension with diastolic congestive heart failure, NYHA class 3 ([place]) 05/17/2013  . Claudication ([place]) 05/16/2013  . PTA/ Stent Rt popliteal and Rt peroneal artery 05/16/13 05/16/2013  . PAD,severe calcified pop and tibial peroneal trunk disease bilateraly   . ABDOMINAL PAIN RIGHT LOWER QUADRANT 10/07/2010  . CVA-STROKE 03/04/2010  . BARRETT'S ESOPHAGUS 03/04/2010  . FECAL INCONTINENCE 03/04/2010  . DM 01/14/2010  . ANEMIA OF CHRONIC DISEASE 01/14/2010  . Atherosclerotic heart disease of native coronary artery with angina pectoris ([place]) 01/14/2010  . DIVERTICULOSIS OF COLON 01/14/2010  . PERSONAL HISTORY OF COLONIC POLYPS 01/14/2010  . SHELLFISH ALLERGY 01/14/2010    Patient's Medications  New Prescriptions   No medications on file   Previous Medications   CARVEDILOL (COREG) 3.125 MG TABLET    Take 1 tablet (3.125 mg total) by mouth 2 (two) times daily.   CLOPIDOGREL (PLAVIX) 75 MG TABLET    Take 75 mg by mouth daily.   COENZYME Q10 (CO Q 10 PO)    Take 1 tablet by mouth at bedtime.    EZETIMIBE (ZETIA) 10 MG TABLET    Take 10 mg by mouth daily.   GABAPENTIN (NEURONTIN) 300 MG CAPSULE    Take 1 capsule (300 mg total) by mouth 3 (three) times daily.   HYDROXYCHLOROQUINE (PLAQUENIL) 200 MG TABLET    Take 400 mg by mouth daily.   ISONIAZID (NYDRAZID) 300 MG TABLET    Take 1 tablet (300 mg total) by mouth daily.   ISOSORBIDE MONONITRATE (IMDUR) 60 MG 24 HR TABLET    Take 60 mg by mouth daily.   ISOSORBIDE MONONITRATE (IMDUR) 60 MG 24 HR TABLET    TAKE 1 TABLET (60 MG TOTAL) BY MOUTH DAILY.   NAPROXEN SODIUM (ANAPROX) 220 MG TABLET    Take 440 mg by mouth 2 (two) times daily as needed.   NITROGLYCERIN (NITROSTAT) 0.4 MG SL TABLET    PLACE 1 TABLET (0.4 MG TOTAL) UNDER THE TONGUE EVERY 5 (FIVE) MINUTES AS NEEDED FOR CHEST PAIN.   NONFORMULARY OR COMPOUNDED ITEM    Nitro gel 0.125 % bid x 6-8 weeks.   PANTOPRAZOLE (PROTONIX) 40 MG TABLET    Take 1 tablet (40 mg total) by mouth 2 (two) times daily before a meal.   PREDNISONE (DELTASONE) 5 MG TABLET  Take 7.5 mg by mouth daily with breakfast.    PYRIDOXINE (VITAMIN B-6) 50 MG TABLET    Take 1 tablet (50 mg total) by mouth daily.   ROSUVASTATIN (CRESTOR) 40 MG TABLET    Take 40 mg by mouth daily.   TRULICITY 1.5 FX/9.0WI SOPN    Inject 1.5 mg into the skin once a week.  Modified Medications   No medications on file  Discontinued Medications   No medications on file    Subjective: [name] is in for her routine follow-up visit.  She was diagnosed with latent tuberculosis last fall.  She started isoniazid in early November.  She had a gradual increase in her liver transaminases to just over 3 times the upper limit of normal by late January.  She was not symptomatic but we elected to  change her to rifampin.  Her liver enzymes normalized quickly off of isoniazid.  On rifampin she developed nausea, vomiting and diarrhea.  She stopped it in late February and felt better fairly quickly.  We decided to have her restart isoniazid 02/13/2018.  Had some intermittent diarrhea and nausea.  C. difficile screens have been negative on 2 occasions.  She has not had any more diarrhea recently.  She describes her nausea as mild and infrequent. She is currently on prednisone 7.5 mg daily and hydroxychloroquine for her rheumatoid arthritis.  Her major complaint recently is persistent low back pain and right groin pain.  She was seen in the emergency department and diagnosed with a possible urinary tract infection.  She was treated with empiric antibiotics but did not improve.  She tells me that Dr. Maudie Mercury, her primary care provider, did x-rays recently and told her that she had degenerative arthritis of her hips and might need surgery.  She has been referred to an orthopedic surgeon.  Review of Systems: Review of Systems  Constitutional: Negative for chills, diaphoresis, fever and weight loss.  Respiratory: Negative for cough, sputum production and shortness of breath.   Gastrointestinal: Positive for nausea. Negative for abdominal pain, diarrhea and vomiting.  Musculoskeletal: Positive for back pain and joint pain.       Persistent right groin pain.    Past Medical History:  Diagnosis Date  . Atherosclerotic heart disease native coronary artery w/angina pectoris ([place]) 1992   a. 1992 s/p PTCA of LAD;  b. 1998 CABG x 3; c. 07/2001 Cath: Sev LM/LAD/LCX dzs, 3/3 patent grafts; d. 11/2013 Cath: stable graft anatomy; e. 10/2016 Cath: native 3VD, VG->OM nl, LIMA->LAD nl, VG->Diag 55.  . Bradycardia, mild briefly to the 40s, asymptomatic 05/16/2013  . Carotid arterial disease ([place])    a. 05/2014 Carotid U/S: No signif bilat ICA stenosis, >60% L ECA.  Marland Kitchen Chronic anemia   . Chronic diastolic CHF (congestive  heart failure) ([place])    a. 10/2016 Echo: Ef 55-60%, no rwma, Gr1 DD, triv AI, PASP 22mmHg, atrial septal aneurysm.  . Chronic diastolic CHF (congestive heart failure), NYHA class 2 ([place]) 08/11/2017  . DM (diabetes mellitus), type 2 with peripheral vascular complications (HCC)    On Oral medications.  . Essential hypertension   . GERD (gastroesophageal reflux disease)   . Hyperlipidemia with target LDL less than 70   . PAD (peripheral artery disease) ([place]) 05/2013; 09/2013   a. severe calcified POP-1 & TP Trunk Dz bilat;  b . 05/2013 Diamondback Rot Athrectomy (R POP) --> PTA R Pop, DES to R Peroneal;  c. 09/2013 PTA/Stenting of L Pop [name] - retrograde  access);  d. 04/2014 ABI's: R/L: 1.1/1.1.  Marland Kitchen Peptic ulcer disease   . S/P CABG x 3 1998   LIMA-LAD, SVG-OM, SVG-D1  . Severe claudication ([place]) 05/2013   Referred for Peripheral Angio (Dr. Gwenlyn Found --> Dr. Brunetta Jeans in [place])    Social History   Tobacco Use  . Smoking status: Never Smoker  . Smokeless tobacco: Never Used  Substance Use Topics  . Alcohol use: No    Comment: rare glass of wine   . Drug use: No    Family History  Problem Relation Age of Onset  . Hypertension Mother   . Hyperlipidemia Mother   . Hyperlipidemia Father   . Hypertension Father     Allergies  Allergen Reactions  . Fish Allergy Hives and Shortness Of Breath  . Ivp Dye [Iodinated Diagnostic Agents] Shortness Of Breath and Anaphylaxis  . Latex Hives, Shortness Of Breath, Itching and Anaphylaxis    REACTION: wheezing  . Shellfish Allergy Hives, Shortness Of Breath and Anaphylaxis    All seafood  . Shellfish-Derived Products Anaphylaxis and Hives  . Other     Patient reports allergy to perfumed detergents. Causes itching.  . Metformin Nausea Only    Objective: Vitals:   04/27/18 1137  BP: (!) 144/72  Pulse: 71  Temp: (!) 97.4 F (36.3 C)  TempSrc: Oral  Weight: 164 lb (74.4 kg)   Body mass index is 29.05 kg/m.  Physical Exam    Constitutional: She is oriented to person, place, and time.  She is uncomfortable and concerned about her right groin pain but otherwise in good spirits.  Cardiovascular: Normal rate and regular rhythm.  No murmur heard. Pulmonary/Chest: Effort normal. She has no wheezes. She has no rales.  Abdominal: Soft. There is no tenderness. There is no rebound.  Neurological: She is alert and oriented to person, place, and time.  Skin: No rash noted.  Psychiatric: Mood and affect normal.    Lab Results CMP     Component Value Date/Time   NA 141 03/16/2018 1443   NA 139 01/14/2017 1302   K 4.2 03/16/2018 1443   CL 104 03/16/2018 1443   CO2 32 03/16/2018 1443   GLUCOSE 93 03/16/2018 1443   BUN 15 03/16/2018 1443   BUN 15 01/14/2017 1302   CREATININE 0.83 03/16/2018 1443   CALCIUM 9.0 03/16/2018 1443   PROT 6.3 03/16/2018 1443   PROT 7.3 05/19/2017 1254   ALBUMIN 3.6 08/12/2017 0541   ALBUMIN 4.4 05/19/2017 1254   AST 50 (H) 03/16/2018 1443   ALT 49 (H) 03/16/2018 1443   ALKPHOS 42 08/12/2017 0541   BILITOT 0.5 03/16/2018 1443   BILITOT 0.3 05/19/2017 1254   GFRNONAA 56 (L) 08/12/2017 0541   GFRNONAA 53 (L) 04/26/2013 1415   GFRAA >60 08/12/2017 0541   GFRAA 61 04/26/2013 1415     Problem List Items Addressed This Visit      High   Latent tuberculosis by blood test    I will have her continue isoniazid for 1 more month.  She will get repeat liver enzymes done today.  She will follow-up in 1 month.  I do not believe that her low back pain or right groin pain are due to latent tuberculosis or isoniazid.      Relevant Orders   Comprehensive metabolic panel       Michel Bickers, MD [facility] for [place] [phone] pager   [phone] cell 04/27/2018, 11:55 AM

## 2018-04-27 NOTE — Assessment & Plan Note (Signed)
I will have her continue isoniazid for 1 more month.  She will get repeat liver enzymes done today.  She will follow-up in 1 month.  I do not believe that her low back pain or right groin pain are due to latent tuberculosis or isoniazid.

## 2018-04-28 LAB — COMPREHENSIVE METABOLIC PANEL
AG RATIO: 1.5 (calc) (ref 1.0–2.5)
ALKALINE PHOSPHATASE (APISO): 34 U/L (ref 33–130)
ALT: 35 U/L — AB (ref 6–29)
AST: 32 U/L (ref 10–35)
Albumin: 3.8 g/dL (ref 3.6–5.1)
BUN/Creatinine Ratio: 23 (calc) — ABNORMAL HIGH (ref 6–22)
BUN: 21 mg/dL (ref 7–25)
CHLORIDE: 104 mmol/L (ref 98–110)
CO2: 25 mmol/L (ref 20–32)
Calcium: 9 mg/dL (ref 8.6–10.4)
Creat: 0.91 mg/dL — ABNORMAL HIGH (ref 0.60–0.88)
GLOBULIN: 2.6 g/dL (ref 1.9–3.7)
Glucose, Bld: 152 mg/dL — ABNORMAL HIGH (ref 65–99)
Potassium: 4.3 mmol/L (ref 3.5–5.3)
Sodium: 140 mmol/L (ref 135–146)
Total Bilirubin: 0.4 mg/dL (ref 0.2–1.2)
Total Protein: 6.4 g/dL (ref 6.1–8.1)

## 2018-05-02 DIAGNOSIS — Z87440 Personal history of urinary (tract) infections: Secondary | ICD-10-CM | POA: Diagnosis not present

## 2018-05-02 DIAGNOSIS — M129 Arthropathy, unspecified: Secondary | ICD-10-CM | POA: Diagnosis not present

## 2018-05-02 DIAGNOSIS — Z79899 Other long term (current) drug therapy: Secondary | ICD-10-CM | POA: Diagnosis not present

## 2018-05-02 DIAGNOSIS — I10 Essential (primary) hypertension: Secondary | ICD-10-CM | POA: Diagnosis not present

## 2018-05-02 DIAGNOSIS — M25551 Pain in right hip: Secondary | ICD-10-CM | POA: Diagnosis not present

## 2018-05-03 DIAGNOSIS — M25569 Pain in unspecified knee: Secondary | ICD-10-CM | POA: Diagnosis not present

## 2018-05-03 DIAGNOSIS — M79643 Pain in unspecified hand: Secondary | ICD-10-CM | POA: Diagnosis not present

## 2018-05-03 DIAGNOSIS — M7989 Other specified soft tissue disorders: Secondary | ICD-10-CM | POA: Diagnosis not present

## 2018-05-03 DIAGNOSIS — Z79899 Other long term (current) drug therapy: Secondary | ICD-10-CM | POA: Diagnosis not present

## 2018-05-03 DIAGNOSIS — R7611 Nonspecific reaction to tuberculin skin test without active tuberculosis: Secondary | ICD-10-CM | POA: Diagnosis not present

## 2018-05-03 DIAGNOSIS — M1990 Unspecified osteoarthritis, unspecified site: Secondary | ICD-10-CM | POA: Diagnosis not present

## 2018-05-04 ENCOUNTER — Encounter (INDEPENDENT_AMBULATORY_CARE_PROVIDER_SITE_OTHER): Payer: Self-pay | Admitting: Specialist

## 2018-05-04 ENCOUNTER — Telehealth (INDEPENDENT_AMBULATORY_CARE_PROVIDER_SITE_OTHER): Payer: Self-pay | Admitting: Specialist

## 2018-05-04 ENCOUNTER — Ambulatory Visit (INDEPENDENT_AMBULATORY_CARE_PROVIDER_SITE_OTHER): Payer: Medicare Other | Admitting: Specialist

## 2018-05-04 ENCOUNTER — Ambulatory Visit (INDEPENDENT_AMBULATORY_CARE_PROVIDER_SITE_OTHER): Payer: Medicare Other

## 2018-05-04 VITALS — BP 161/77 | HR 67 | Ht 63.0 in | Wt 165.5 lb

## 2018-05-04 DIAGNOSIS — M25551 Pain in right hip: Secondary | ICD-10-CM

## 2018-05-04 DIAGNOSIS — M4316 Spondylolisthesis, lumbar region: Secondary | ICD-10-CM | POA: Diagnosis not present

## 2018-05-04 DIAGNOSIS — M4326 Fusion of spine, lumbar region: Secondary | ICD-10-CM | POA: Diagnosis not present

## 2018-05-04 DIAGNOSIS — M1612 Unilateral primary osteoarthritis, left hip: Secondary | ICD-10-CM

## 2018-05-04 MED ORDER — DULOXETINE HCL 20 MG PO CPEP: 20.0000 mg | ORAL_CAPSULE | Freq: Every day | ORAL | 3 refills | Status: DC

## 2018-05-04 NOTE — Patient Instructions (Addendum)
  Hips suffering from osteoarthritis, only real proven treatments are Weight loss, Prednisone for inflamation and exercise. Well padded shoes help. Ice the hip  2-3 times a day 15-20 . mins at a time. Start cymbalta 20 mg per day Use a cane or a crutch or walker to relieve stress on the right hip.  MRI RIght hip is ordered as the plain radiographs show only mild degenerative changes right hip

## 2018-05-04 NOTE — Progress Notes (Signed)
Office Visit Note   Patient: [name]           Date of Birth: [birth year]           MRN: [id number] Visit Date: 05/04/2018              Requested by: Jani Gravel, MD [place] [address] [place], [place] [address] PCP: Jani Gravel, MD   Assessment & Plan: Visit Diagnoses:  1. Pain of right hip joint   2. Unilateral primary osteoarthritis, left hip   3. Spondylolisthesis, lumbar region   4. Fusion of spine of lumbar region     Plan: Hips suffering from osteoarthritis, only real proven treatments are Weight loss, Prednisone for inflamation and exercise. Well padded shoes help. Ice the hip  2-3 times a day 15-20 . mins at a time. Start cymbalta 20 mg per day Use a cane or a crutch or walker to relieve stress on the right hip.  MRI RIght hip is ordered as the plain radiographs show only mild degenerative changes right hip  Follow-Up Instructions: No follow-ups on file.   Orders:  Orders Placed This Encounter  Procedures  . XR Pelvis 1-2 Views  . MR Hip Right w/o contrast   Meds ordered this encounter  Medications  . DULoxetine (CYMBALTA) 20 MG capsule    Sig: Take 1 capsule (20 mg total) by mouth daily.    Dispense:  30 capsule    Refill:  3      Procedures: No procedures performed   Clinical Data: No additional findings.   Subjective: Chief Complaint  Patient presents with  . Right Hip - Pain    84 year old female with a history of L4-5 decompression and fusion, she has been having pain into the right hip and groin since April 17-18th. She has pain with standing and walking and has taken tramadol without help and alleve causes liver function test abnormalities. Has a history of UTI and she has  Taken Keflex again and she is seen for her back today. History of treatment with prednisone for osteoarthritis of the hands and legs and most recently started on a different medication by the Rheumatologist and increased to 10 mg the prednisone  yesterday.   Review of Systems  Constitutional: Negative.   HENT: Negative.   Eyes: Negative.   Respiratory: Negative.   Cardiovascular: Negative.   Gastrointestinal: Negative.   Endocrine: Negative.   Genitourinary: Negative.   Musculoskeletal: Negative.   Skin: Negative.   Allergic/Immunologic: Negative.   Neurological: Negative.   Hematological: Negative.   Psychiatric/Behavioral: Negative.      Objective: Vital Signs: BP (!) 161/77 (BP Location: Left Arm, Patient Position: Sitting)   Pulse 67   Ht 5\' 3"  (1.6 m)   Wt 165 lb 8 oz (75.1 kg)   BMI 29.32 kg/m   Physical Exam  Constitutional: She is oriented to person, place, and time. She appears well-developed and well-nourished.  HENT:  Head: Normocephalic and atraumatic.  Eyes: Pupils are equal, round, and reactive to light. EOM are normal.  Neck: Normal range of motion. Neck supple.  Pulmonary/Chest: Effort normal and breath sounds normal.  Abdominal: Soft. Bowel sounds are normal.  Neurological: She is alert and oriented to person, place, and time.  Skin: Skin is warm and dry.  Psychiatric: She has a normal mood and affect. Her behavior is normal. Judgment and thought content normal.    Right Hip Exam   Tenderness  The patient  is experiencing tenderness in the anterior.  Range of Motion  Abduction: abnormal  Adduction: abnormal  Extension: abnormal  Flexion:  110 abnormal  External rotation:  60 abnormal  Internal rotation:  15 abnormal   Muscle Strength  Abduction: 5/5  Adduction: 5/5  Flexion: 4/5   Tests  FABER: negative [place]: negative  Other  Erythema: absent Scars: absent Sensation: normal Pulse: present  Comments:  No swelling right hip noted nontender right greater trochanter.       Specialty Comments:  No specialty comments available.  Imaging: Xr Pelvis 1-2 Views  Result Date: 05/04/2018 AP and lateral right hip shows the hip joint line is well maintained, DDD L4-5. CT of  the abdomen from 10/2017 with narrowing of the posteroir inferior right hip joint line. No fracture. The left hip shows more severe joint line narrowing and femoral head deformity.     PMFS History: Patient Active Problem List   Diagnosis Date Noted  . Diarrhea 01/31/2018  . Anal fissure 11/10/2017  . Rectal bleeding 11/10/2017  . Rectal pain 11/10/2017  . Inflammatory arthritis 10/13/2017  . Latent tuberculosis by blood test 10/13/2017  . Chest pain with high risk for cardiac etiology 08/11/2017  . Chronic diastolic CHF (congestive heart failure), NYHA class 2 ([place]) 08/11/2017  . Fatigue due to treatment 01/09/2017  . NSTEMI (non-ST elevated myocardial infarction) ([place]) 11/01/2016  . Diabetes mellitus ([place]) 10/24/2015  . Non-insulin dependent type 2 diabetes mellitus ([place]) 10/24/2015  . Angina, class I ([place]) 04/22/2015  . DOE (dyspnea on exertion) 04/22/2015  . Leg edema, left 04/22/2015  . Accumulation of fluid in tissues 03/21/2014  . Vertigo, central 01/02/2014  . Vertigo 01/02/2014  . Limb pain- echymosis, pain Lt radial artery after cath 12/11/2013  . Chest pain with low risk for cardiac etiology- patent grafts on cath 11/26/13; ? microvascular ischemia 11/28/2013  . Dyslipidemia, goal LDL below 70 05/17/2013  . Accelerated hypertension with diastolic congestive heart failure, NYHA class 3 ([place]) 05/17/2013  . Claudication ([place]) 05/16/2013  . PTA/ Stent Rt popliteal and Rt peroneal artery 05/16/13 05/16/2013  . PAD,severe calcified pop and tibial peroneal trunk disease bilateraly   . ABDOMINAL PAIN RIGHT LOWER QUADRANT 10/07/2010  . CVA-STROKE 03/04/2010  . BARRETT'S ESOPHAGUS 03/04/2010  . FECAL INCONTINENCE 03/04/2010  . DM 01/14/2010  . ANEMIA OF CHRONIC DISEASE 01/14/2010  . Atherosclerotic heart disease of native coronary artery with angina pectoris ([place]) 01/14/2010  . DIVERTICULOSIS OF COLON 01/14/2010  . PERSONAL HISTORY OF COLONIC POLYPS 01/14/2010  . [place]  ALLERGY 01/14/2010   Past Medical History:  Diagnosis Date  . Atherosclerotic heart disease native coronary artery w/angina pectoris ([place]) 1992   a. 1992 s/p PTCA of LAD;  b. 1998 CABG x 3; c. 07/2001 Cath: Sev LM/LAD/LCX dzs, 3/3 patent grafts; d. 11/2013 Cath: stable graft anatomy; e. 10/2016 Cath: native 3VD, VG->OM nl, LIMA->LAD nl, VG->Diag 55.  . Bradycardia, mild briefly to the 40s, asymptomatic 05/16/2013  . Carotid arterial disease ([place])    a. 05/2014 Carotid U/S: No signif bilat ICA stenosis, >60% L ECA.  Marland Kitchen Chronic anemia   . Chronic diastolic CHF (congestive heart failure) ([place])    a. 10/2016 Echo: Ef 55-60%, no rwma, Gr1 DD, triv AI, PASP 70mmHg, atrial septal aneurysm.  . Chronic diastolic CHF (congestive heart failure), NYHA class 2 ([place]) 08/11/2017  . DM (diabetes mellitus), type 2 with peripheral vascular complications (HCC)    On Oral medications.  . Essential hypertension   .  GERD (gastroesophageal reflux disease)   . Hyperlipidemia with target LDL less than 70   . PAD (peripheral artery disease) ([place]) 05/2013; 09/2013   a. severe calcified POP-1 & TP Trunk Dz bilat;  b . 05/2013 Diamondback Rot Athrectomy (R POP) --> PTA R Pop, DES to R Peroneal;  c. 09/2013 PTA/Stenting of L Pop [name] - retrograde access);  d. 04/2014 ABI's: R/L: 1.1/1.1.  Marland Kitchen Peptic ulcer disease   . S/P CABG x 3 1998   LIMA-LAD, SVG-OM, SVG-D1  . Severe claudication ([place]) 05/2013   Referred for Peripheral Angio (Dr. Gwenlyn Found --> Dr. Brunetta Jeans in [place])    Family History  Problem Relation Age of Onset  . Hypertension Mother   . Hyperlipidemia Mother   . Hyperlipidemia Father   . Hypertension Father     Past Surgical History:  Procedure Laterality Date  . ATHERECTOMY Right 05/15/2013   Procedure: ATHERECTOMY;  Surgeon: Lorretta Harp, MD;  Location[facility] CATH LAB;  Service: Cardiovascular;  Laterality: Right;  popliteal  . BACK SURGERY    . CARDIAC CATHETERIZATION  07/17/01   2 v CAD with LM, circ, LAD,  obtuse diag, mod stenosis of diag vein graft , mild stenosis of marg vein graft, nl EF, medical treatment  . CARDIAC CATHETERIZATION  11/2013   Widely patent LIMA-LAD & SVG-OM with mild progression of proximal stenosis ~40-50% in SVG-D1; Widely patent native RCA with known severe native LCA disease  . CARDIAC CATHETERIZATION N/A 11/01/2016   Procedure: Left Heart Cath and Cors/Grafts Angiography;  Surgeon: [name], MD;  Location: [facility] CV LAB;  Service: Cardiovascular: Hemodynamics: High LVEDP!.  Cors/Grafts: LM 55%, o-p LAD 100% then after D1 -> LIMA-LAD patent, SVG-Diag ~55%. small RI wiht ~70% ostial. O-p Cx 80% - pCx 70% --> SVG-bifurcating OM patent. -- med Rx  . CORONARY ANGIOPLASTY  1992   PCI to LAD  . CORONARY ARTERY BYPASS GRAFT  1998   LIMA-LAD, SVG-diagonal (none roughly 50% stenosis), SVG-OM  . LEFT HEART CATHETERIZATION WITH CORONARY ANGIOGRAM N/A 11/27/2013   Procedure: LEFT HEART CATHETERIZATION WITH CORONARY ANGIOGRAM;  Surgeon: [name], MD;  Location: [facility] CATH LAB;  Service: Cardiovascular;  Laterality: N/A;  . LOW EXTREMITY DOPPLERS/ABI  10/2016   Patent right SFA, popliteal and peroneal tendons. Patent left SFA and popliteal stent. 30-50% stenosis in the right common femoral and popliteal arteries, 50-74% stenosis in left profunda femoris artery. --> 1 year follow-up.  RABI - 1.2, LABI 1.1  . LOWER EXTREMITY ANGIOGRAM N/A 02/22/2013   Procedure: LOWER EXTREMITY ANGIOGRAM;  Surgeon: Lorretta Harp, MD;  Location: [facility] CATH LAB;  Service: Cardiovascular;  Laterality: N/A;  . LOWER EXTREMITY ANGIOGRAM N/A 07/20/2013   Procedure: LOWER EXTREMITY ANGIOGRAM;  Surgeon: Lorretta Harp, MD;  Location: [facility] CATH LAB;  Service: Cardiovascular;  Laterality: N/A;  . NM MYOVIEW LTD  04/03/2013; 5/26'/2016   Lexiscan: a)  Apical perfusion defect with no ischemia. Consider prior infarct versus breast attenuation.;; b) 5/'16: LOW RISK, Nl EF ~55-65%, No Ischemia /Infarction  .  NM MYOVIEW LTD  08/12/2017   Multiple low RISK. Normal study. No EKG changes. 1.5 mm T wave inversions noted in V2-V4 that began 1 minute into stress. -Ended 3 minutes into rest.  EF 55-65%  . PERCUTANEOUS STENT INTERVENTION Right 05/15/2013   Procedure: PERCUTANEOUS STENT INTERVENTION;  Surgeon: Lorretta Harp, MD;  Location: [facility] CATH LAB;  Service: Cardiovascular;  Laterality: Right;  tibioperoneal trunk  . Peroneal  Artery Stent  05/15/13   Diamondback rot. atherectomy of high grade segmental popliteal stenosis then [place] balloonPTA; then angiosculpt PTA of the prox peroneal with stent-Xpedition   . pv angio  07/20/13   high-grade calcified popliteal disease with one-vessel runoff via a small anterior tibial artery that has proximal disease as well, no intervention  . TRANSTHORACIC ECHOCARDIOGRAM  10/2016   EF 55-60%. Gr 1 DD. Normal PAP. Aortic Sclerosis.  . TRANSTHORACIC ECHOCARDIOGRAM  02/2011; 04/2015   a) EF 50-55%, mild inf HK;; b)  Nl LV Size & Fxn EF 60-65%. mild LVH, Gr 1 DD.  . TUBAL LIGATION     Social History   Occupational History  . Not on file  Tobacco Use  . Smoking status: Never Smoker  . Smokeless tobacco: Never Used  Substance and Sexual Activity  . Alcohol use: No    Comment: rare glass of wine   . Drug use: No  . Sexual activity: Not on file

## 2018-05-13 ENCOUNTER — Other Ambulatory Visit: Payer: Self-pay | Admitting: Cardiology

## 2018-05-16 NOTE — Telephone Encounter (Signed)
Rx(s) sent to pharmacy electronically.  

## 2018-05-17 ENCOUNTER — Ambulatory Visit
Admission: RE | Admit: 2018-05-17 | Discharge: 2018-05-17 | Disposition: A | Discharge status: Home / Self Care | Payer: Medicare Other | Source: Ambulatory Visit | Attending: Specialist | Admitting: Specialist

## 2018-05-17 DIAGNOSIS — M1611 Unilateral primary osteoarthritis, right hip: Secondary | ICD-10-CM | POA: Diagnosis not present

## 2018-05-17 DIAGNOSIS — M25551 Pain in right hip: Secondary | ICD-10-CM

## 2018-05-17 DIAGNOSIS — M1612 Unilateral primary osteoarthritis, left hip: Secondary | ICD-10-CM

## 2018-05-18 ENCOUNTER — Telehealth (INDEPENDENT_AMBULATORY_CARE_PROVIDER_SITE_OTHER): Payer: Self-pay | Admitting: Specialist

## 2018-05-18 NOTE — Telephone Encounter (Signed)
Patient called to state that  DULoxetine (CYMBALTA) 20 MG capsule  Is not helping. She had a rough time during the MRI.  Wants Nitka to prescribe something else.

## 2018-05-24 ENCOUNTER — Other Ambulatory Visit (INDEPENDENT_AMBULATORY_CARE_PROVIDER_SITE_OTHER): Payer: Self-pay | Admitting: Specialist

## 2018-05-24 ENCOUNTER — Telehealth (INDEPENDENT_AMBULATORY_CARE_PROVIDER_SITE_OTHER): Payer: Self-pay | Admitting: Specialist

## 2018-05-24 DIAGNOSIS — M1611 Unilateral primary osteoarthritis, right hip: Secondary | ICD-10-CM

## 2018-05-24 MED ORDER — ACETAMINOPHEN-CODEINE #3 300-30 MG PO TABS: 1.0000 | ORAL_TABLET | Freq: Four times a day (QID) | ORAL | 0 refills | Status: AC | PRN

## 2018-05-24 NOTE — Telephone Encounter (Signed)
Patient is requesting a pain prescription, she said she is in so much pain. She didn't request a certain med. Please advise # [phone]

## 2018-05-24 NOTE — Progress Notes (Unsigned)
Tylenol #3 for pain. Review of the MRI of the right hip shows moderate osteoarthritis.  Will have her seen by Dr. Ernestina Patches for right hip intraarticular injection for arthritis.

## 2018-05-24 NOTE — Telephone Encounter (Signed)
Patient is requesting a pain prescription, she said she is in so much pain. She didn't request a certain med.

## 2018-05-25 DIAGNOSIS — E118 Type 2 diabetes mellitus with unspecified complications: Secondary | ICD-10-CM | POA: Diagnosis not present

## 2018-05-25 DIAGNOSIS — Z5181 Encounter for therapeutic drug level monitoring: Secondary | ICD-10-CM | POA: Diagnosis not present

## 2018-05-25 DIAGNOSIS — Z79899 Other long term (current) drug therapy: Secondary | ICD-10-CM | POA: Diagnosis not present

## 2018-05-25 DIAGNOSIS — I10 Essential (primary) hypertension: Secondary | ICD-10-CM | POA: Diagnosis not present

## 2018-05-26 DIAGNOSIS — N390 Urinary tract infection, site not specified: Secondary | ICD-10-CM | POA: Diagnosis not present

## 2018-05-26 DIAGNOSIS — Z79899 Other long term (current) drug therapy: Secondary | ICD-10-CM | POA: Diagnosis not present

## 2018-05-26 DIAGNOSIS — R109 Unspecified abdominal pain: Secondary | ICD-10-CM | POA: Diagnosis not present

## 2018-05-30 ENCOUNTER — Ambulatory Visit (INDEPENDENT_AMBULATORY_CARE_PROVIDER_SITE_OTHER): Payer: Medicare Other | Admitting: Internal Medicine

## 2018-05-30 ENCOUNTER — Encounter: Payer: Self-pay | Admitting: Internal Medicine

## 2018-05-30 DIAGNOSIS — R7611 Nonspecific reaction to tuberculin skin test without active tuberculosis: Secondary | ICD-10-CM | POA: Diagnosis not present

## 2018-05-30 DIAGNOSIS — M159 Polyosteoarthritis, unspecified: Secondary | ICD-10-CM

## 2018-05-30 DIAGNOSIS — M1990 Unspecified osteoarthritis, unspecified site: Secondary | ICD-10-CM

## 2018-05-30 DIAGNOSIS — K625 Hemorrhage of anus and rectum: Secondary | ICD-10-CM | POA: Diagnosis not present

## 2018-05-30 DIAGNOSIS — M150 Primary generalized (osteo)arthritis: Secondary | ICD-10-CM

## 2018-05-30 DIAGNOSIS — M8949 Other hypertrophic osteoarthropathy, multiple sites: Secondary | ICD-10-CM

## 2018-05-30 DIAGNOSIS — Z227 Latent tuberculosis: Secondary | ICD-10-CM

## 2018-05-30 HISTORY — DX: Unspecified osteoarthritis, unspecified site: M19.90

## 2018-05-30 LAB — COMPREHENSIVE METABOLIC PANEL
AG RATIO: 1.4 (calc) (ref 1.0–2.5)
ALT: 19 U/L (ref 6–29)
AST: 24 U/L (ref 10–35)
Albumin: 3.8 g/dL (ref 3.6–5.1)
Alkaline phosphatase (APISO): 33 U/L (ref 33–130)
BUN: 17 mg/dL (ref 7–25)
CHLORIDE: 105 mmol/L (ref 98–110)
CO2: 29 mmol/L (ref 20–32)
Calcium: 9.3 mg/dL (ref 8.6–10.4)
Creat: 0.81 mg/dL (ref 0.60–0.88)
GLOBULIN: 2.8 g/dL (ref 1.9–3.7)
GLUCOSE: 150 mg/dL — AB (ref 65–99)
POTASSIUM: 4.3 mmol/L (ref 3.5–5.3)
Sodium: 143 mmol/L (ref 135–146)
Total Bilirubin: 0.5 mg/dL (ref 0.2–1.2)
Total Protein: 6.6 g/dL (ref 6.1–8.1)

## 2018-05-30 NOTE — Assessment & Plan Note (Signed)
I encouraged her to call and schedule a follow-up visit with Alonza Bogus, her gastroenterology provider.

## 2018-05-30 NOTE — Assessment & Plan Note (Signed)
She is now completed 6 weeks of therapy for latent tuberculosis.  Several months ago she did develop mild, asymptomatic elevation of transaminases.  She had only slight elevation of her ALT 1 month ago.  I will repeat her lab work today and have her stop the isoniazid and B6 now.  She can follow-up here as needed.

## 2018-05-30 NOTE — Progress Notes (Signed)
[facility]  Patient Active Problem List   Diagnosis Date Noted  . Latent tuberculosis by blood test 10/13/2017    Priority: High  . Inflammatory arthritis 10/13/2017    Priority: Medium  . Degenerative joint disease 05/30/2018  . Anal fissure 11/10/2017  . Rectal bleeding 11/10/2017  . Rectal pain 11/10/2017  . Chest pain with high risk for cardiac etiology 08/11/2017  . Chronic diastolic CHF (congestive heart failure), NYHA class 2 ([place]) 08/11/2017  . Fatigue due to treatment 01/09/2017  . NSTEMI (non-ST elevated myocardial infarction) ([place]) 11/01/2016  . Diabetes mellitus ([place]) 10/24/2015  . Non-insulin dependent type 2 diabetes mellitus ([place]) 10/24/2015  . Angina, class I ([place]) 04/22/2015  . DOE (dyspnea on exertion) 04/22/2015  . Leg edema, left 04/22/2015  . Accumulation of fluid in tissues 03/21/2014  . Vertigo, central 01/02/2014  . Vertigo 01/02/2014  . Limb pain- echymosis, pain Lt radial artery after cath 12/11/2013  . Chest pain with low risk for cardiac etiology- patent grafts on cath 11/26/13; ? microvascular ischemia 11/28/2013  . Dyslipidemia, goal LDL below 70 05/17/2013  . Accelerated hypertension with diastolic congestive heart failure, NYHA class 3 ([place]) 05/17/2013  . Claudication ([place]) 05/16/2013  . PTA/ Stent Rt popliteal and Rt peroneal artery 05/16/13 05/16/2013  . PAD,severe calcified pop and tibial peroneal trunk disease bilateraly   . ABDOMINAL PAIN RIGHT LOWER QUADRANT 10/07/2010  . CVA-STROKE 03/04/2010  . BARRETT'S ESOPHAGUS 03/04/2010  . FECAL INCONTINENCE 03/04/2010  . DM 01/14/2010  . ANEMIA OF CHRONIC DISEASE 01/14/2010  . Atherosclerotic heart disease of native coronary artery with angina pectoris ([place]) 01/14/2010  . DIVERTICULOSIS OF COLON 01/14/2010  . PERSONAL HISTORY OF COLONIC POLYPS 01/14/2010  . SHELLFISH ALLERGY 01/14/2010    Patient's Medications  New Prescriptions   No medications on file    Previous Medications   ACETAMINOPHEN-CODEINE (TYLENOL #3) 300-30 MG TABLET    Take 1 tablet by mouth every 6 (six) hours as needed for up to 7 days for moderate pain.   CARVEDILOL (COREG) 3.125 MG TABLET    Take 1 tablet (3.125 mg total) by mouth 2 (two) times daily.   CLOPIDOGREL (PLAVIX) 75 MG TABLET    Take 75 mg by mouth daily.   COENZYME Q10 (CO Q 10 PO)    Take 1 tablet by mouth at bedtime.    DULOXETINE (CYMBALTA) 20 MG CAPSULE    Take 1 capsule (20 mg total) by mouth daily.   EZETIMIBE (ZETIA) 10 MG TABLET    Take 10 mg by mouth daily.   GABAPENTIN (NEURONTIN) 300 MG CAPSULE    Take 1 capsule (300 mg total) by mouth 3 (three) times daily.   HYDROXYCHLOROQUINE (PLAQUENIL) 200 MG TABLET    Take 400 mg by mouth daily.   ISOSORBIDE MONONITRATE (IMDUR) 60 MG 24 HR TABLET    Take 60 mg by mouth daily.   ISOSORBIDE MONONITRATE (IMDUR) 60 MG 24 HR TABLET    TAKE 1 TABLET (60 MG TOTAL) BY MOUTH DAILY.   NAPROXEN SODIUM (ANAPROX) 220 MG TABLET    Take 440 mg by mouth 2 (two) times daily as needed.   NITROGLYCERIN (NITROSTAT) 0.4 MG SL TABLET    PLACE 1 TABLET (0.4 MG TOTAL) UNDER THE TONGUE EVERY 5 (FIVE) MINUTES AS NEEDED FOR CHEST PAIN.   NONFORMULARY OR COMPOUNDED ITEM    Nitro gel 0.125 % bid x 6-8 weeks.   PANTOPRAZOLE (PROTONIX) 40  MG TABLET    Take 1 tablet (40 mg total) by mouth 2 (two) times daily before a meal.   PREDNISONE (DELTASONE) 5 MG TABLET    Take 7.5 mg by mouth daily with breakfast.    ROSUVASTATIN (CRESTOR) 40 MG TABLET    Take 40 mg by mouth daily.   TRULICITY 1.5 SW/1.0XN SOPN    Inject 1.5 mg into the skin once a week.  Modified Medications   No medications on file  Discontinued Medications   ISONIAZID (NYDRAZID) 300 MG TABLET    Take 1 tablet (300 mg total) by mouth daily.   PYRIDOXINE (VITAMIN B-6) 50 MG TABLET    Take 1 tablet (50 mg total) by mouth daily.    Subjective: [name] is in for her routine follow-up visit.  She is now completed 6 total months of isoniazid  and B6 for latent tuberculosis.  She has been tolerating her medication well.  She continues to be bothered by pain in her right groin.  She was recently diagnosed with degenerative arthritis of her right hip and is scheduled for a hip injection.  She tells me that she believes the pain may be due to something else.  This morning she had a bowel movement associated with rectal pain and bright red bleeding.  Apparently she had a similar episode last year and was seen by GI in November.  She was treated with intrarectal nitroglycerin gel.  She believes that it did help somewhat with the pain but she is now out.  Review of Systems: Review of Systems  Constitutional: Negative for chills, diaphoresis, fever, malaise/fatigue and weight loss.  Respiratory: Negative for cough, sputum production and shortness of breath.   Cardiovascular: Negative for chest pain.  Gastrointestinal: Positive for blood in stool. Negative for abdominal pain, constipation, diarrhea, nausea and vomiting.       Rectal pain.  Right groin pain.  Musculoskeletal: Positive for joint pain.  Neurological: Negative for headaches.    Past Medical History:  Diagnosis Date  . Atherosclerotic heart disease native coronary artery w/angina pectoris ([place]) 1992   a. 1992 s/p PTCA of LAD;  b. 1998 CABG x 3; c. 07/2001 Cath: Sev LM/LAD/LCX dzs, 3/3 patent grafts; d. 11/2013 Cath: stable graft anatomy; e. 10/2016 Cath: native 3VD, VG->OM nl, LIMA->LAD nl, VG->Diag 55.  . Bradycardia, mild briefly to the 40s, asymptomatic 05/16/2013  . Carotid arterial disease ([place])    a. 05/2014 Carotid U/S: No signif bilat ICA stenosis, >60% L ECA.  Marland Kitchen Chronic anemia   . Chronic diastolic CHF (congestive heart failure) ([place])    a. 10/2016 Echo: Ef 55-60%, no rwma, Gr1 DD, triv AI, PASP 53mmHg, atrial septal aneurysm.  . Chronic diastolic CHF (congestive heart failure), NYHA class 2 ([place]) 08/11/2017  . DM (diabetes mellitus), type 2 with peripheral vascular  complications (HCC)    On Oral medications.  . Essential hypertension   . GERD (gastroesophageal reflux disease)   . Hyperlipidemia with target LDL less than 70   . PAD (peripheral artery disease) ([place]) 05/2013; 09/2013   a. severe calcified POP-1 & TP Trunk Dz bilat;  b . 05/2013 Diamondback Rot Athrectomy (R POP) --> PTA R Pop, DES to R Peroneal;  c. 09/2013 PTA/Stenting of L Pop [name] - retrograde access);  d. 04/2014 ABI's: R/L: 1.1/1.1.  Marland Kitchen Peptic ulcer disease   . S/P CABG x 3 1998   LIMA-LAD, SVG-OM, SVG-D1  . Severe claudication ([place]) 05/2013   Referred for Peripheral  Angio (Dr. Gwenlyn Found --> Dr. Brunetta Jeans in [place])    Social History   Tobacco Use  . Smoking status: Never Smoker  . Smokeless tobacco: Never Used  Substance Use Topics  . Alcohol use: No    Comment: rare glass of wine   . Drug use: No    Family History  Problem Relation Age of Onset  . Hypertension Mother   . Hyperlipidemia Mother   . Hyperlipidemia Father   . Hypertension Father     Allergies  Allergen Reactions  . Fish Allergy Hives and Shortness Of Breath  . Ivp Dye [Iodinated Diagnostic Agents] Shortness Of Breath and Anaphylaxis  . Latex Hives, Shortness Of Breath, Itching and Anaphylaxis    REACTION: wheezing  . Shellfish Allergy Hives, Shortness Of Breath and Anaphylaxis    All seafood  . Shellfish-Derived Products Anaphylaxis and Hives  . Other     Patient reports allergy to perfumed detergents. Causes itching.  . Metformin Nausea Only    Objective: Vitals:   05/30/18 1049  BP: 102/67  Pulse: 92  Temp: 97.7 F (36.5 C)  TempSrc: Oral  Weight: 158 lb 12.8 oz (72 kg)   Body mass index is 28.13 kg/m.  Physical Exam  Constitutional:  She is in good spirits.  Cardiovascular: Normal rate, regular rhythm and normal heart sounds.  No murmur heard. Pulmonary/Chest: Effort normal and breath sounds normal. She has no wheezes. She has no rales.  Abdominal: Soft. Bowel sounds are normal.  She exhibits no distension. There is no tenderness.  Skin: No rash noted.  Psychiatric: She has a normal mood and affect.    Lab Results CMP     Component Value Date/Time   NA 140 04/27/2018 1156   NA 139 01/14/2017 1302   K 4.3 04/27/2018 1156   CL 104 04/27/2018 1156   CO2 25 04/27/2018 1156   GLUCOSE 152 (H) 04/27/2018 1156   BUN 21 04/27/2018 1156   BUN 15 01/14/2017 1302   CREATININE 0.91 (H) 04/27/2018 1156   CALCIUM 9.0 04/27/2018 1156   PROT 6.4 04/27/2018 1156   PROT 7.3 05/19/2017 1254   ALBUMIN 3.6 08/12/2017 0541   ALBUMIN 4.4 05/19/2017 1254   AST 32 04/27/2018 1156   ALT 35 (H) 04/27/2018 1156   ALKPHOS 42 08/12/2017 0541   BILITOT 0.4 04/27/2018 1156   BILITOT 0.3 05/19/2017 1254   GFRNONAA 56 (L) 08/12/2017 0541   GFRNONAA 53 (L) 04/26/2013 1415   GFRAA >60 08/12/2017 0541   GFRAA 61 04/26/2013 1415     Problem List Items Addressed This Visit      High   Latent tuberculosis by blood test    She is now completed 6 weeks of therapy for latent tuberculosis.  Several months ago she did develop mild, asymptomatic elevation of transaminases.  She had only slight elevation of her ALT 1 month ago.  I will repeat her lab work today and have her stop the isoniazid and B6 now.  She can follow-up here as needed.      Relevant Orders   Comprehensive metabolic panel     Unprioritized   Degenerative joint disease   Rectal bleeding    I encouraged her to call and schedule a follow-up visit with Alonza Bogus, her gastroenterology provider.          Michel Bickers, MD [facility] [phone] pager   [phone] cell 05/30/2018, 12:33 PM

## 2018-06-01 DIAGNOSIS — Z79899 Other long term (current) drug therapy: Secondary | ICD-10-CM | POA: Diagnosis not present

## 2018-06-01 DIAGNOSIS — I10 Essential (primary) hypertension: Secondary | ICD-10-CM | POA: Diagnosis not present

## 2018-06-01 DIAGNOSIS — E118 Type 2 diabetes mellitus with unspecified complications: Secondary | ICD-10-CM | POA: Diagnosis not present

## 2018-06-01 DIAGNOSIS — E789 Disorder of lipoprotein metabolism, unspecified: Secondary | ICD-10-CM | POA: Diagnosis not present

## 2018-06-05 DIAGNOSIS — I10 Essential (primary) hypertension: Secondary | ICD-10-CM | POA: Diagnosis not present

## 2018-06-05 DIAGNOSIS — E118 Type 2 diabetes mellitus with unspecified complications: Secondary | ICD-10-CM | POA: Diagnosis not present

## 2018-06-05 DIAGNOSIS — E785 Hyperlipidemia, unspecified: Secondary | ICD-10-CM | POA: Diagnosis not present

## 2018-06-08 DIAGNOSIS — R300 Dysuria: Secondary | ICD-10-CM | POA: Diagnosis not present

## 2018-06-08 DIAGNOSIS — N8111 Cystocele, midline: Secondary | ICD-10-CM | POA: Diagnosis not present

## 2018-06-08 DIAGNOSIS — R102 Pelvic and perineal pain: Secondary | ICD-10-CM | POA: Diagnosis not present

## 2018-06-09 ENCOUNTER — Encounter

## 2018-06-09 ENCOUNTER — Encounter (INDEPENDENT_AMBULATORY_CARE_PROVIDER_SITE_OTHER): Payer: Self-pay | Admitting: Physical Medicine and Rehabilitation

## 2018-06-09 ENCOUNTER — Ambulatory Visit (INDEPENDENT_AMBULATORY_CARE_PROVIDER_SITE_OTHER): Payer: Self-pay

## 2018-06-09 ENCOUNTER — Ambulatory Visit (INDEPENDENT_AMBULATORY_CARE_PROVIDER_SITE_OTHER): Payer: Medicare Other | Admitting: Physical Medicine and Rehabilitation

## 2018-06-09 DIAGNOSIS — M25551 Pain in right hip: Secondary | ICD-10-CM

## 2018-06-09 MED ORDER — BUPIVACAINE HCL 0.5 % IJ SOLN
3.0000 mL | INTRAMUSCULAR | Status: AC | PRN
  Administered 2018-06-09: 3 mL via INTRA_ARTICULAR

## 2018-06-09 MED ORDER — TRIAMCINOLONE ACETONIDE 40 MG/ML IJ SUSP
80.0000 mg | INTRAMUSCULAR | Status: AC | PRN
  Administered 2018-06-09: 80 mg via INTRA_ARTICULAR

## 2018-06-09 NOTE — Patient Instructions (Signed)

## 2018-06-09 NOTE — Progress Notes (Signed)
IZZABELLE BOULEY - 84 y.o. female MRN [id number]  Date of birth: [birth year]  Office Visit Note: Visit Date: 06/09/2018 PCP: Jani Gravel, MD Referred by: Jani Gravel, MD  Subjective: Chief Complaint  Patient presents with  . Right Hip - Pain   HPI: Mrs. Laurance Flatten is an 84 year old female that comes in today with request by Dr. Basil Dess for diagnostic note for therapeutic anesthetic hip arthrogram on the right.  She reports beginning in April 2019 she began having hip and groin pain and that walking really makes the pain worse.  She does use some medications to ease the pain a little bit.  She has a history of prior lumbar surgery and fusion.  She is allergic to iodinated contrast, with a listed anaphylactic reaction.  MRI of the right hip shows moderate osteoarthritis with likely synovitis and hip joint effusion.  There is also moderate arthritis on the left.   ROS Otherwise per HPI.  Assessment & Plan: Visit Diagnoses:  1. Pain in right hip     Plan: Findings:  Diagnostic note for therapeutic anesthetic hip arthrogram on the right.  Patient did seem to have some relief of symptoms during the anesthetic phase of the injection.  She was still walking with somewhat antalgic gait however did report did seem to feel some better.    Meds & Orders: No orders of the defined types were placed in this encounter.   Orders Placed This Encounter  Procedures  . Large Joint Inj: R hip joint  . XR C-ARM NO REPORT    Follow-up: Return for Dr. Louanne Skye.   Procedures: Large Joint Inj: R hip joint on 06/09/2018 9:20 AM Indications: pain and diagnostic evaluation Details: 22 G needle, anterior approach  Arthrogram: Yes (Magnevist was utilized instead of iodinated contrast)  Medications: 3 mL bupivacaine 0.5 %; 80 mg triamcinolone acetonide 40 MG/ML Outcome: tolerated well, no immediate complications  Arthrogram demonstrated excellent flow of contrast throughout the joint surface without extravasation  or obvious defect.  The patient had relief of symptoms during the anesthetic phase of the injection.  Procedure, treatment alternatives, risks and benefits explained, specific risks discussed. Consent was given by the patient. Immediately prior to procedure a time out was called to verify the correct patient, procedure, equipment, support staff and site/side marked as required. Patient was prepped and draped in the usual sterile fashion.      No notes on file   Clinical History: MR Hip Right w/o contrast  IMPRESSION: 1. Moderate osteoarthritis of the right hip with a prominent right hip effusion consistent with superimposed synovitis. 2. Moderate osteoarthritis of the left hip, more extensive than on the right. However, there is no significant left hip effusion. 3. Review of the prior CT scan of the abdomen dated 11/01/2017 does demonstrate severe right foraminal stenosis at L4-5 which could affect the right L4 nerve.   Electronically Signed   By: Lorriane Shire M.D.   On: 05/18/2018 09:02   She reports that she has never smoked. She has never used smokeless tobacco. No results for input(s): HGBA1C, LABURIC in the last 8760 hours.  Objective:  VS:  HT:    WT:   BMI:     BP:   HR: bpm  TEMP: ( )  RESP:  Physical Exam  Ortho Exam Imaging: Xr C-arm No Report  Result Date: 06/09/2018 Please see Notes tab for imaging impression.   Past Medical/Family/Surgical/Social History: Medications & Allergies reviewed per EMR, new medications updated.  Patient Active Problem List   Diagnosis Date Noted  . Degenerative joint disease 05/30/2018  . Anal fissure 11/10/2017  . Rectal bleeding 11/10/2017  . Rectal pain 11/10/2017  . Inflammatory arthritis 10/13/2017  . Latent tuberculosis by blood test 10/13/2017  . Chest pain with high risk for cardiac etiology 08/11/2017  . Chronic diastolic CHF (congestive heart failure), NYHA class 2 ([place]) 08/11/2017  . Fatigue due to treatment  01/09/2017  . NSTEMI (non-ST elevated myocardial infarction) ([place]) 11/01/2016  . Diabetes mellitus ([place]) 10/24/2015  . Non-insulin dependent type 2 diabetes mellitus ([place]) 10/24/2015  . Angina, class I ([place]) 04/22/2015  . DOE (dyspnea on exertion) 04/22/2015  . Leg edema, left 04/22/2015  . Accumulation of fluid in tissues 03/21/2014  . Vertigo, central 01/02/2014  . Vertigo 01/02/2014  . Limb pain- echymosis, pain Lt radial artery after cath 12/11/2013  . Chest pain with low risk for cardiac etiology- patent grafts on cath 11/26/13; ? microvascular ischemia 11/28/2013  . Dyslipidemia, goal LDL below 70 05/17/2013  . Accelerated hypertension with diastolic congestive heart failure, NYHA class 3 ([place]) 05/17/2013  . Claudication ([place]) 05/16/2013  . PTA/ Stent Rt popliteal and Rt peroneal artery 05/16/13 05/16/2013  . PAD,severe calcified pop and tibial peroneal trunk disease bilateraly   . ABDOMINAL PAIN RIGHT LOWER QUADRANT 10/07/2010  . CVA-STROKE 03/04/2010  . BARRETT'S ESOPHAGUS 03/04/2010  . FECAL INCONTINENCE 03/04/2010  . DM 01/14/2010  . ANEMIA OF CHRONIC DISEASE 01/14/2010  . Atherosclerotic heart disease of native coronary artery with angina pectoris ([place]) 01/14/2010  . DIVERTICULOSIS OF COLON 01/14/2010  . PERSONAL HISTORY OF COLONIC POLYPS 01/14/2010  . Miguel Barrera ALLERGY 01/14/2010   Past Medical History:  Diagnosis Date  . Atherosclerotic heart disease native coronary artery w/angina pectoris ([place]) 1992   a. 1992 s/p PTCA of LAD;  b. 1998 CABG x 3; c. 07/2001 Cath: Sev LM/LAD/LCX dzs, 3/3 patent grafts; d. 11/2013 Cath: stable graft anatomy; e. 10/2016 Cath: native 3VD, VG->OM nl, LIMA->LAD nl, VG->Diag 55.  . Bradycardia, mild briefly to the 40s, asymptomatic 05/16/2013  . Carotid arterial disease ([place])    a. 05/2014 Carotid U/S: No signif bilat ICA stenosis, >60% L ECA.  Marland Kitchen Chronic anemia   . Chronic diastolic CHF (congestive heart failure) ([place])    a. 10/2016 Echo: Ef  55-60%, no rwma, Gr1 DD, triv AI, PASP 77mmHg, atrial septal aneurysm.  . Chronic diastolic CHF (congestive heart failure), NYHA class 2 ([place]) 08/11/2017  . DM (diabetes mellitus), type 2 with peripheral vascular complications (HCC)    On Oral medications.  . Essential hypertension   . GERD (gastroesophageal reflux disease)   . Hyperlipidemia with target LDL less than 70   . PAD (peripheral artery disease) ([place]) 05/2013; 09/2013   a. severe calcified POP-1 & TP Trunk Dz bilat;  b . 05/2013 Diamondback Rot Athrectomy (R POP) --> PTA R Pop, DES to R Peroneal;  c. 09/2013 PTA/Stenting of L Pop [name] - retrograde access);  d. 04/2014 ABI's: R/L: 1.1/1.1.  Marland Kitchen Peptic ulcer disease   . S/P CABG x 3 1998   LIMA-LAD, SVG-OM, SVG-D1  . Severe claudication ([place]) 05/2013   Referred for Peripheral Angio (Dr. Gwenlyn Found --> Dr. Brunetta Jeans in [place])   Family History  Problem Relation Age of Onset  . Hypertension Mother   . Hyperlipidemia Mother   . Hyperlipidemia Father   . Hypertension Father    Past Surgical History:  Procedure Laterality Date  . ATHERECTOMY Right 05/15/2013  Procedure: ATHERECTOMY;  Surgeon: Lorretta Harp, MD;  Location: [facility] CATH LAB;  Service: Cardiovascular;  Laterality: Right;  popliteal  . BACK SURGERY    . CARDIAC CATHETERIZATION  07/17/01   2 v CAD with LM, circ, LAD, obtuse diag, mod stenosis of diag vein graft , mild stenosis of marg vein graft, nl EF, medical treatment  . CARDIAC CATHETERIZATION  11/2013   Widely patent LIMA-LAD & SVG-OM with mild progression of proximal stenosis ~40-50% in SVG-D1; Widely patent native RCA with known severe native LCA disease  . CARDIAC CATHETERIZATION N/A 11/01/2016   Procedure: Left Heart Cath and Cors/Grafts Angiography;  Surgeon: [name], MD;  Location: [facility] CV LAB;  Service: Cardiovascular: Hemodynamics: High LVEDP!.  Cors/Grafts: LM 55%, o-p LAD 100% then after D1 -> LIMA-LAD patent, SVG-Diag ~55%. small RI wiht ~70%  ostial. O-p Cx 80% - pCx 70% --> SVG-bifurcating OM patent. -- med Rx  . CORONARY ANGIOPLASTY  1992   PCI to LAD  . CORONARY ARTERY BYPASS GRAFT  1998   LIMA-LAD, SVG-diagonal (none roughly 50% stenosis), SVG-OM  . LEFT HEART CATHETERIZATION WITH CORONARY ANGIOGRAM N/A 11/27/2013   Procedure: LEFT HEART CATHETERIZATION WITH CORONARY ANGIOGRAM;  Surgeon: [name], MD;  Location: [facility] CATH LAB;  Service: Cardiovascular;  Laterality: N/A;  . LOW EXTREMITY DOPPLERS/ABI  10/2016   Patent right SFA, popliteal and peroneal tendons. Patent left SFA and popliteal stent. 30-50% stenosis in the right common femoral and popliteal arteries, 50-74% stenosis in left profunda femoris artery. --> 1 year follow-up.  RABI - 1.2, LABI 1.1  . LOWER EXTREMITY ANGIOGRAM N/A 02/22/2013   Procedure: LOWER EXTREMITY ANGIOGRAM;  Surgeon: Lorretta Harp, MD;  Location: [facility] CATH LAB;  Service: Cardiovascular;  Laterality: N/A;  . LOWER EXTREMITY ANGIOGRAM N/A 07/20/2013   Procedure: LOWER EXTREMITY ANGIOGRAM;  Surgeon: Lorretta Harp, MD;  Location: [facility] CATH LAB;  Service: Cardiovascular;  Laterality: N/A;  . NM MYOVIEW LTD  04/03/2013; 5/26'/2016   Lexiscan: a)  Apical perfusion defect with no ischemia. Consider prior infarct versus breast attenuation.;; b) 5/'16: LOW RISK, Nl EF ~55-65%, No Ischemia /Infarction  . NM MYOVIEW LTD  08/12/2017   Multiple low RISK. Normal study. No EKG changes. 1.5 mm T wave inversions noted in V2-V4 that began 1 minute into stress. -Ended 3 minutes into rest.  EF 55-65%  . PERCUTANEOUS STENT INTERVENTION Right 05/15/2013   Procedure: PERCUTANEOUS STENT INTERVENTION;  Surgeon: Lorretta Harp, MD;  Location: [facility] CATH LAB;  Service: Cardiovascular;  Laterality: Right;  tibioperoneal trunk  . Peroneal Artery Stent  05/15/13   Diamondback rot. atherectomy of high grade segmental popliteal stenosis then [place] balloonPTA; then angiosculpt PTA of the prox peroneal with stent-Xpedition   . pv  angio  07/20/13   high-grade calcified popliteal disease with one-vessel runoff via a small anterior tibial artery that has proximal disease as well, no intervention  . TRANSTHORACIC ECHOCARDIOGRAM  10/2016   EF 55-60%. Gr 1 DD. Normal PAP. Aortic Sclerosis.  . TRANSTHORACIC ECHOCARDIOGRAM  02/2011; 04/2015   a) EF 50-55%, mild inf HK;; b)  Nl LV Size & Fxn EF 60-65%. mild LVH, Gr 1 DD.  . TUBAL LIGATION     Social History   Occupational History  . Not on file  Tobacco Use  . Smoking status: Never Smoker  . Smokeless tobacco: Never Used  Substance and Sexual Activity  . Alcohol use: No    Comment: rare glass of wine   .  Drug use: No  . Sexual activity: Not on file

## 2018-06-09 NOTE — Progress Notes (Signed)
 .  Numeric Pain Rating Scale and Functional Assessment Average Pain 8   In the last MONTH (on 0-10 scale) has pain interfered with the following?  1. General activity like being  able to carry out your everyday physical activities such as walking, climbing stairs, carrying groceries, or moving a chair?  Rating(7)    +Dye Allergies(Contrast).

## 2018-06-14 ENCOUNTER — Other Ambulatory Visit (INDEPENDENT_AMBULATORY_CARE_PROVIDER_SITE_OTHER): Payer: Self-pay | Admitting: Specialist

## 2018-06-14 NOTE — Telephone Encounter (Signed)
Tylenol # 3 Refill request 

## 2018-06-16 NOTE — Telephone Encounter (Signed)
Called to [facility] 

## 2018-06-20 ENCOUNTER — Ambulatory Visit: Payer: Medicare Other | Admitting: Gastroenterology

## 2018-06-21 ENCOUNTER — Encounter (INDEPENDENT_AMBULATORY_CARE_PROVIDER_SITE_OTHER): Payer: Self-pay | Admitting: Specialist

## 2018-06-21 ENCOUNTER — Ambulatory Visit (INDEPENDENT_AMBULATORY_CARE_PROVIDER_SITE_OTHER): Payer: Medicare Other | Admitting: Specialist

## 2018-06-21 VITALS — BP 127/83 | HR 91 | Ht 63.0 in | Wt 165.0 lb

## 2018-06-21 DIAGNOSIS — M48062 Spinal stenosis, lumbar region with neurogenic claudication: Secondary | ICD-10-CM | POA: Diagnosis not present

## 2018-06-21 DIAGNOSIS — M1611 Unilateral primary osteoarthritis, right hip: Secondary | ICD-10-CM | POA: Diagnosis not present

## 2018-06-21 MED ORDER — ACETAMINOPHEN-CODEINE #3 300-30 MG PO TABS: ORAL_TABLET | ORAL | 0 refills | Status: DC

## 2018-06-21 NOTE — Patient Instructions (Signed)
Use a cane in the left hand for the right hip or a walker.  Take the prednisone. Tylenol ES for pain one tablet 3 times a day. Ice to the hip 15-20 minutes at a time if necessary for pain. See Dr. Ninfa Linden for evaluation, as you may eventually require a right Total Hip Replacement.

## 2018-06-21 NOTE — Progress Notes (Signed)
Office Visit Note   Patient: [name]           Date of Birth: 12/13/1933           MRN: [id number] Visit Date: 06/21/2018              Requested by: Jani Gravel, MD [address], [place] [address] PCP: Jani Gravel, MD   Assessment & Plan: Visit Diagnoses:  1. Unilateral primary osteoarthritis, right hip   2. Spinal stenosis of lumbar region with neurogenic claudication     Plan: Use a cane in the left hand for the right hip or a walker.  Take the prednisone. Tylenol ES for pain one tablet 3 times a day. Ice to the hip 15-20 minutes at a time if necessary for pain. See Dr. Ninfa Linden for evaluation, as you may eventually require a right Total Hip Replacement  Follow-Up Instructions: Return in about 3 weeks (around 07/12/2018) for See Dr. Ninfa Linden for evaluation of the right hip .   Orders:  No orders of the defined types were placed in this encounter.  Meds ordered this encounter  Medications  . acetaminophen-codeine (TYLENOL #3) 300-30 MG tablet    Sig: TAKE 1 TABLET BY MOUTH EVERY 6 HOURS AS NEEDED FOR PAIN FOR UP TO 7 DAYS    Dispense:  30 tablet    Refill:  0    Not to exceed 5 additional fills before 11/20/2018.      Procedures: No procedures performed   Clinical Data: No additional findings.   Subjective: Chief Complaint  Patient presents with  . Right Hip - Follow-up    Had right hip injection with Dr. Ernestina Patches on 06/09/18    84 year old female with severe worsening pain into the right hip with standing and assisting in the care of her husband unable to stay in one She underwent an MRI of the lumbar spine and the right hip. The lumbar spine showed some mild foramenal narrowing. Plain radiographs of Right hip were not showing more than mild findings, the MRI however demonstrated moderately severe thin of the right femoral head articular cartilage and an joint effusion with synovitis changes. She underwent a right hip aspiration and  injection with an excellent initial response to the Right hip injection, now nearly 2 weeks post injection the pain is worsening. She takes deltasone 5 mg per day, and plaquenil for RA. More recently  Had [place] and sulfanazol added. Dr. Daria Pastures the rheumatologist.    Review of Systems  Constitutional: Negative.   HENT: Negative.   Eyes: Negative.   Respiratory: Negative.   Cardiovascular: Negative.   Gastrointestinal: Negative.   Endocrine: Negative.   Genitourinary: Negative.   Musculoskeletal: Negative.   Skin: Negative.   Allergic/Immunologic: Negative.   Neurological: Negative.   Hematological: Negative.   Psychiatric/Behavioral: Negative.      Objective: Vital Signs: BP 127/83 (BP Location: Left Arm, Patient Position: Sitting)   Pulse 91   Ht 5\' 3"  (1.6 m)   Wt 165 lb (74.8 kg)   BMI 29.23 kg/m   Physical Exam  Constitutional: She is oriented to person, place, and time. She appears well-developed and well-nourished.  HENT:  Head: Normocephalic and atraumatic.  Eyes: Pupils are equal, round, and reactive to light. EOM are normal.  Neck: Normal range of motion. Neck supple.  Pulmonary/Chest: Effort normal and breath sounds normal.  Abdominal: Soft. Bowel sounds are normal.  Neurological: She is alert and oriented to  person, place, and time.  Skin: Skin is warm and dry.  Psychiatric: She has a normal mood and affect. Her behavior is normal. Judgment and thought content normal.    Right Hip Exam   Tenderness  The patient is experiencing tenderness in the anterior and lateral.  Range of Motion  Abduction: abnormal  Adduction: normal  Extension: normal  Flexion: abnormal  External rotation: abnormal  Internal rotation: abnormal   Muscle Strength  Abduction: 5/5  Adduction: 5/5  Flexion: 5/5   Tests  FABER: negative Ober: negative  Other  Erythema: absent Scars: absent Sensation: normal Pulse: present      Specialty Comments:  No specialty  comments available.  Imaging: No results found.   PMFS History: Patient Active Problem List   Diagnosis Date Noted  . Degenerative joint disease 05/30/2018  . Anal fissure 11/10/2017  . Rectal bleeding 11/10/2017  . Rectal pain 11/10/2017  . Inflammatory arthritis 10/13/2017  . Latent tuberculosis by blood test 10/13/2017  . Chest pain with high risk for cardiac etiology 08/11/2017  . Chronic diastolic CHF (congestive heart failure), NYHA class 2 ([place]) 08/11/2017  . Fatigue due to treatment 01/09/2017  . NSTEMI (non-ST elevated myocardial infarction) ([place]) 11/01/2016  . Diabetes mellitus ([place]) 10/24/2015  . Non-insulin dependent type 2 diabetes mellitus ([place]) 10/24/2015  . Angina, class I ([place]) 04/22/2015  . DOE (dyspnea on exertion) 04/22/2015  . Leg edema, left 04/22/2015  . Accumulation of fluid in tissues 03/21/2014  . Vertigo, central 01/02/2014  . Vertigo 01/02/2014  . Limb pain- echymosis, pain Lt radial artery after cath 12/11/2013  . Chest pain with low risk for cardiac etiology- patent grafts on cath 11/26/13; ? microvascular ischemia 11/28/2013  . Dyslipidemia, goal LDL below 70 05/17/2013  . Accelerated hypertension with diastolic congestive heart failure, NYHA class 3 ([place]) 05/17/2013  . Claudication ([place]) 05/16/2013  . PTA/ Stent Rt popliteal and Rt peroneal artery 05/16/13 05/16/2013  . PAD,severe calcified pop and tibial peroneal trunk disease bilateraly   . ABDOMINAL PAIN RIGHT LOWER QUADRANT 10/07/2010  . CVA-STROKE 03/04/2010  . BARRETT'S ESOPHAGUS 03/04/2010  . FECAL INCONTINENCE 03/04/2010  . DM 01/14/2010  . ANEMIA OF CHRONIC DISEASE 01/14/2010  . Atherosclerotic heart disease of native coronary artery with angina pectoris ([place]) 01/14/2010  . DIVERTICULOSIS OF COLON 01/14/2010  . PERSONAL HISTORY OF COLONIC POLYPS 01/14/2010  . [place] ALLERGY 01/14/2010   Past Medical History:  Diagnosis Date  . Atherosclerotic heart disease native coronary artery  w/angina pectoris ([place]) 1992   a. 1992 s/p PTCA of LAD;  b. 1998 CABG x 3; c. 07/2001 Cath: Sev LM/LAD/LCX dzs, 3/3 patent grafts; d. 11/2013 Cath: stable graft anatomy; e. 10/2016 Cath: native 3VD, VG->OM nl, LIMA->LAD nl, VG->Diag 55.  . Bradycardia, mild briefly to the 40s, asymptomatic 05/16/2013  . Carotid arterial disease ([place])    a. 05/2014 Carotid U/S: No signif bilat ICA stenosis, >60% L ECA.  Marland Kitchen Chronic anemia   . Chronic diastolic CHF (congestive heart failure) ([place])    a. 10/2016 Echo: Ef 55-60%, no rwma, Gr1 DD, triv AI, PASP 2mmHg, atrial septal aneurysm.  . Chronic diastolic CHF (congestive heart failure), NYHA class 2 ([place]) 08/11/2017  . DM (diabetes mellitus), type 2 with peripheral vascular complications (HCC)    On Oral medications.  . Essential hypertension   . GERD (gastroesophageal reflux disease)   . Hyperlipidemia with target LDL less than 70   . PAD (peripheral artery disease) ([place]) 05/2013; 09/2013  a. severe calcified POP-1 & TP Trunk Dz bilat;  b . 05/2013 Diamondback Rot Athrectomy (R POP) --> PTA R Pop, DES to R Peroneal;  c. 09/2013 PTA/Stenting of L Pop (G. Andree Elk - retrograde access);  d. 04/2014 ABI's: R/L: 1.1/1.1.  Marland Kitchen Peptic ulcer disease   . S/P CABG x 3 1998   LIMA-LAD, SVG-OM, SVG-D1  . Severe claudication ([place]) 05/2013   Referred for Peripheral Angio (Dr. Gwenlyn Found --> Dr. Brunetta Jeans in [place])    Family History  Problem Relation Age of Onset  . Hypertension Mother   . Hyperlipidemia Mother   . Hyperlipidemia Father   . Hypertension Father     Past Surgical History:  Procedure Laterality Date  . ATHERECTOMY Right 05/15/2013   Procedure: ATHERECTOMY;  Surgeon: Lorretta Harp, MD;  Location: [facility] CATH LAB;  Service: Cardiovascular;  Laterality: Right;  popliteal  . BACK SURGERY    . CARDIAC CATHETERIZATION  07/17/01   2 v CAD with LM, circ, LAD, obtuse diag, mod stenosis of diag vein graft , mild stenosis of marg vein graft, nl EF, medical treatment  . CARDIAC  CATHETERIZATION  11/2013   Widely patent LIMA-LAD & SVG-OM with mild progression of proximal stenosis ~40-50% in SVG-D1; Widely patent native RCA with known severe native LCA disease  . CARDIAC CATHETERIZATION N/A 11/01/2016   Procedure: Left Heart Cath and Cors/Grafts Angiography;  Surgeon: [name], MD;  Location: [place] CV LAB;  Service: Cardiovascular: Hemodynamics: High LVEDP!.  Cors/Grafts: LM 55%, o-p LAD 100% then after D1 -> LIMA-LAD patent, SVG-Diag ~55%. small RI wiht ~70% ostial. O-p Cx 80% - pCx 70% --> SVG-bifurcating OM patent. -- med Rx  . CORONARY ANGIOPLASTY  1992   PCI to LAD  . CORONARY ARTERY BYPASS GRAFT  1998   LIMA-LAD, SVG-diagonal (none roughly 50% stenosis), SVG-OM  . LEFT HEART CATHETERIZATION WITH CORONARY ANGIOGRAM N/A 11/27/2013   Procedure: LEFT HEART CATHETERIZATION WITH CORONARY ANGIOGRAM;  Surgeon: [name], MD;  Location: [facility] CATH LAB;  Service: Cardiovascular;  Laterality: N/A;  . LOW EXTREMITY DOPPLERS/ABI  10/2016   Patent right SFA, popliteal and peroneal tendons. Patent left SFA and popliteal stent. 30-50% stenosis in the right common femoral and popliteal arteries, 50-74% stenosis in left profunda femoris artery. --> 1 year follow-up.  RABI - 1.2, LABI 1.1  . LOWER EXTREMITY ANGIOGRAM N/A 02/22/2013   Procedure: LOWER EXTREMITY ANGIOGRAM;  Surgeon: Lorretta Harp, MD;  Location: [facility] CATH LAB;  Service: Cardiovascular;  Laterality: N/A;  . LOWER EXTREMITY ANGIOGRAM N/A 07/20/2013   Procedure: LOWER EXTREMITY ANGIOGRAM;  Surgeon: Lorretta Harp, MD;  Location: [facility] CATH LAB;  Service: Cardiovascular;  Laterality: N/A;  . NM MYOVIEW LTD  04/03/2013; 5/26'/2016   Lexiscan: a)  Apical perfusion defect with no ischemia. Consider prior infarct versus breast attenuation.;; b) 5/'16: LOW RISK, Nl EF ~55-65%, No Ischemia /Infarction  . NM MYOVIEW LTD  08/12/2017   Multiple low RISK. Normal study. No EKG changes. 1.5 mm T wave inversions noted in V2-V4  that began 1 minute into stress. -Ended 3 minutes into rest.  EF 55-65%  . PERCUTANEOUS STENT INTERVENTION Right 05/15/2013   Procedure: PERCUTANEOUS STENT INTERVENTION;  Surgeon: Lorretta Harp, MD;  Location: [facility] CATH LAB;  Service: Cardiovascular;  Laterality: Right;  tibioperoneal trunk  . Peroneal Artery Stent  05/15/13   Diamondback rot. atherectomy of high grade segmental popliteal stenosis then Chocolate balloonPTA; then angiosculpt PTA of the prox peroneal with  stent-Xpedition   . pv angio  07/20/13   high-grade calcified popliteal disease with one-vessel runoff via a small anterior tibial artery that has proximal disease as well, no intervention  . TRANSTHORACIC ECHOCARDIOGRAM  10/2016   EF 55-60%. Gr 1 DD. Normal PAP. Aortic Sclerosis.  . TRANSTHORACIC ECHOCARDIOGRAM  02/2011; 04/2015   a) EF 50-55%, mild inf HK;; b)  Nl LV Size & Fxn EF 60-65%. mild LVH, Gr 1 DD.  . TUBAL LIGATION     Social History   Occupational History  . Not on file  Tobacco Use  . Smoking status: Never Smoker  . Smokeless tobacco: Never Used  Substance and Sexual Activity  . Alcohol use: No    Comment: rare glass of wine   . Drug use: No  . Sexual activity: Not on file

## 2018-06-23 DIAGNOSIS — M0600 Rheumatoid arthritis without rheumatoid factor, unspecified site: Secondary | ICD-10-CM | POA: Diagnosis not present

## 2018-06-23 DIAGNOSIS — R7611 Nonspecific reaction to tuberculin skin test without active tuberculosis: Secondary | ICD-10-CM | POA: Diagnosis not present

## 2018-06-23 DIAGNOSIS — Z79899 Other long term (current) drug therapy: Secondary | ICD-10-CM | POA: Diagnosis not present

## 2018-06-23 DIAGNOSIS — M25569 Pain in unspecified knee: Secondary | ICD-10-CM | POA: Diagnosis not present

## 2018-06-23 DIAGNOSIS — M7989 Other specified soft tissue disorders: Secondary | ICD-10-CM | POA: Diagnosis not present

## 2018-06-23 DIAGNOSIS — M79643 Pain in unspecified hand: Secondary | ICD-10-CM | POA: Diagnosis not present

## 2018-06-23 DIAGNOSIS — M1990 Unspecified osteoarthritis, unspecified site: Secondary | ICD-10-CM | POA: Diagnosis not present

## 2018-06-26 ENCOUNTER — Ambulatory Visit (INDEPENDENT_AMBULATORY_CARE_PROVIDER_SITE_OTHER): Payer: Self-pay | Admitting: Physical Medicine and Rehabilitation

## 2018-07-06 ENCOUNTER — Ambulatory Visit: Payer: Medicare Other | Admitting: Gastroenterology

## 2018-07-10 ENCOUNTER — Ambulatory Visit: Payer: Medicare Other | Admitting: Nurse Practitioner

## 2018-07-12 ENCOUNTER — Ambulatory Visit (INDEPENDENT_AMBULATORY_CARE_PROVIDER_SITE_OTHER): Payer: Medicare Other | Admitting: Orthopaedic Surgery

## 2018-07-15 ENCOUNTER — Other Ambulatory Visit: Payer: Self-pay | Admitting: Internal Medicine

## 2018-07-15 DIAGNOSIS — Z227 Latent tuberculosis: Secondary | ICD-10-CM

## 2018-07-21 DIAGNOSIS — R102 Pelvic and perineal pain: Secondary | ICD-10-CM | POA: Diagnosis not present

## 2018-07-21 DIAGNOSIS — R300 Dysuria: Secondary | ICD-10-CM | POA: Diagnosis not present

## 2018-08-02 ENCOUNTER — Encounter (INDEPENDENT_AMBULATORY_CARE_PROVIDER_SITE_OTHER): Payer: Self-pay | Admitting: Orthopaedic Surgery

## 2018-08-02 ENCOUNTER — Ambulatory Visit (INDEPENDENT_AMBULATORY_CARE_PROVIDER_SITE_OTHER): Payer: Medicare Other | Admitting: Orthopaedic Surgery

## 2018-08-02 DIAGNOSIS — M1611 Unilateral primary osteoarthritis, right hip: Secondary | ICD-10-CM

## 2018-08-02 DIAGNOSIS — M1612 Unilateral primary osteoarthritis, left hip: Secondary | ICD-10-CM

## 2018-08-02 MED ORDER — TRAMADOL HCL 50 MG PO TABS: 50.0000 mg | ORAL_TABLET | Freq: Four times a day (QID) | ORAL | 0 refills | Status: DC | PRN

## 2018-08-02 NOTE — Progress Notes (Signed)
Office Visit Note   Patient: [name]           Date of Birth: 12-19-1932           MRN: [id number] Visit Date: 08/02/2018              Requested by: Jani Gravel, MD [address], [place] [address] PCP: Jani Gravel, MD   Assessment & Plan: Visit Diagnoses:  1. Unilateral primary osteoarthritis, left hip   2. Unilateral primary osteoarthritis, right hip     Plan: Although she has severe arthritis in her left hip the moderate severe arthritis in her right hip is much more painful to her and this is the one is more symptomatic and that she would like to have replaced.  I agree with this is now based on her clinical exam findings as well as the plain films and MRI findings and the fact that a steroid injection to temporize her symptoms in her right hip.  I showed her hip model explained in detail what the surgery involves.  We a long thorough discussion about her intraoperative and postoperative course.  We talked about the risk and benefits of surgery as well.  I gave her handout about hip replacement surgery.  All questions concerns were answered and addressed.  She does take care of her husband with dementia and she is going arrange care for him.  She wants to consider surgery sometime in mid to late September.  Follow-Up Instructions: Return for 2 weeks post-op.   Orders:  No orders of the defined types were placed in this encounter.  No orders of the defined types were placed in this encounter.     Procedures: No procedures performed   Clinical Data: No additional findings.   Subjective: Chief Complaint  Patient presents with  . Right Hip - Pain  The patient is sent to me from Dr. Louanne Skye to evaluate and treat debilitating right hip pain with known osteoarthritis of the right hip.  She actually has quite severe arthritis of her left hip but minimal pain in the left hip.  The right hip shows severe arthritic changes on the MRI but not on plain film.   She is had intra-articular injection only in the right hip with a steroid and this did temporize her symptoms.  She is active 84 year old female.  Her pain is daily and is become severe.  She is a diabetic but under good control.  She reports increased pain.  This is decreased her quality of life and her mobility.  At this point she wished to proceed with a right total hip arthroplasty given her plain film and MRI findings and the failure of conservative treatment measures.  She feels like she is at her wits end in terms of dealing with the right hip.  Again she reports minimal issues with her left hip thus far.  HPI  Review of Systems She currently denies any headache, chest pain, shortness of breath, fever, chills, nausea, vomiting.  Objective: Vital Signs: There were no vitals taken for this visit.  Physical Exam She is alert and oriented x3 and in no acute distress Ortho Exam Examination of the right and left hips are performed today.  She has severe pain with internal/external rotation of the right hip and moderate pain with internal rotation of left hip.  Both knees have a normal exam.  Both hips have limitations in the rotation as well. Specialty Comments:  No specialty  comments available.  Imaging: No results found. Independent review of the plain films and MRI of both hips were obtained.  They reviewed today.  She has severe arthritis of the of the left hip and moderate to severe arthritis of the right hip.  PMFS History: Patient Active Problem List   Diagnosis Date Noted  . Degenerative joint disease 05/30/2018  . Anal fissure 11/10/2017  . Rectal bleeding 11/10/2017  . Rectal pain 11/10/2017  . Inflammatory arthritis 10/13/2017  . Latent tuberculosis by blood test 10/13/2017  . Chest pain with high risk for cardiac etiology 08/11/2017  . Chronic diastolic CHF (congestive heart failure), NYHA class 2 ([place]) 08/11/2017  . Fatigue due to treatment 01/09/2017  . NSTEMI (non-ST  elevated myocardial infarction) ([place]) 11/01/2016  . Diabetes mellitus ([place]) 10/24/2015  . Non-insulin dependent type 2 diabetes mellitus ([place]) 10/24/2015  . Angina, class I ([place]) 04/22/2015  . DOE (dyspnea on exertion) 04/22/2015  . Leg edema, left 04/22/2015  . Accumulation of fluid in tissues 03/21/2014  . Vertigo, central 01/02/2014  . Vertigo 01/02/2014  . Limb pain- echymosis, pain Lt radial artery after cath 12/11/2013  . Chest pain with low risk for cardiac etiology- patent grafts on cath 11/26/13; ? microvascular ischemia 11/28/2013  . Dyslipidemia, goal LDL below 70 05/17/2013  . Accelerated hypertension with diastolic congestive heart failure, NYHA class 3 ([place]) 05/17/2013  . Claudication ([place]) 05/16/2013  . PTA/ Stent Rt popliteal and Rt peroneal artery 05/16/13 05/16/2013  . PAD,severe calcified pop and tibial peroneal trunk disease bilateraly   . ABDOMINAL PAIN RIGHT LOWER QUADRANT 10/07/2010  . CVA-STROKE 03/04/2010  . BARRETT'S ESOPHAGUS 03/04/2010  . FECAL INCONTINENCE 03/04/2010  . DM 01/14/2010  . ANEMIA OF CHRONIC DISEASE 01/14/2010  . Atherosclerotic heart disease of native coronary artery with angina pectoris ([place]) 01/14/2010  . DIVERTICULOSIS OF COLON 01/14/2010  . PERSONAL HISTORY OF COLONIC POLYPS 01/14/2010  . [place] ALLERGY 01/14/2010   Past Medical History:  Diagnosis Date  . Atherosclerotic heart disease native coronary artery w/angina pectoris ([place]) 1992   a. 1992 s/p PTCA of LAD;  b. 1998 CABG x 3; c. 07/2001 Cath: Sev LM/LAD/LCX dzs, 3/3 patent grafts; d. 11/2013 Cath: stable graft anatomy; e. 10/2016 Cath: native 3VD, VG->OM nl, LIMA->LAD nl, VG->Diag 55.  . Bradycardia, mild briefly to the 40s, asymptomatic 05/16/2013  . Carotid arterial disease ([place])    a. 05/2014 Carotid U/S: No signif bilat ICA stenosis, >60% L ECA.  Marland Kitchen Chronic anemia   . Chronic diastolic CHF (congestive heart failure) ([place])    a. 10/2016 Echo: Ef 55-60%, no rwma, Gr1 DD, triv AI,  PASP 83mmHg, atrial septal aneurysm.  . Chronic diastolic CHF (congestive heart failure), NYHA class 2 ([place]) 08/11/2017  . DM (diabetes mellitus), type 2 with peripheral vascular complications (HCC)    On Oral medications.  . Essential hypertension   . GERD (gastroesophageal reflux disease)   . Hyperlipidemia with target LDL less than 70   . PAD (peripheral artery disease) ([place]) 05/2013; 09/2013   a. severe calcified POP-1 & TP Trunk Dz bilat;  b . 05/2013 Diamondback Rot Athrectomy (R POP) --> PTA R Pop, DES to R Peroneal;  c. 09/2013 PTA/Stenting of L Pop [name] - retrograde access);  d. 04/2014 ABI's: R/L: 1.1/1.1.  Marland Kitchen Peptic ulcer disease   . S/P CABG x 3 1998   LIMA-LAD, SVG-OM, SVG-D1  . Severe claudication ([place]) 05/2013   Referred for Peripheral Angio (Dr. Gwenlyn Found --> Dr. Iona Beard  [facility] in [place])    Family History  Problem Relation Age of Onset  . Hypertension Mother   . Hyperlipidemia Mother   . Hyperlipidemia Father   . Hypertension Father     Past Surgical History:  Procedure Laterality Date  . ATHERECTOMY Right 05/15/2013   Procedure: ATHERECTOMY;  Surgeon: Lorretta Harp, MD;  Location: [facility] CATH LAB;  Service: Cardiovascular;  Laterality: Right;  popliteal  . BACK SURGERY    . CARDIAC CATHETERIZATION  07/17/01   2 v CAD with LM, circ, LAD, obtuse diag, mod stenosis of diag vein graft , mild stenosis of marg vein graft, nl EF, medical treatment  . CARDIAC CATHETERIZATION  11/2013   Widely patent LIMA-LAD & SVG-OM with mild progression of proximal stenosis ~40-50% in SVG-D1; Widely patent native RCA with known severe native LCA disease  . CARDIAC CATHETERIZATION N/A 11/01/2016   Procedure: Left Heart Cath and Cors/Grafts Angiography;  Surgeon: [name], MD;  Location: [place] CV LAB;  Service: Cardiovascular: Hemodynamics: High LVEDP!.  Cors/Grafts: LM 55%, o-p LAD 100% then after D1 -> LIMA-LAD patent, SVG-Diag ~55%. small RI wiht ~70% ostial. O-p Cx 80% - pCx 70% -->  SVG-bifurcating OM patent. -- med Rx  . CORONARY ANGIOPLASTY  1992   PCI to LAD  . CORONARY ARTERY BYPASS GRAFT  1998   LIMA-LAD, SVG-diagonal (none roughly 50% stenosis), SVG-OM  . LEFT HEART CATHETERIZATION WITH CORONARY ANGIOGRAM N/A 11/27/2013   Procedure: LEFT HEART CATHETERIZATION WITH CORONARY ANGIOGRAM;  Surgeon: [name], MD;  Location: [facility] CATH LAB;  Service: Cardiovascular;  Laterality: N/A;  . LOW EXTREMITY DOPPLERS/ABI  10/2016   Patent right SFA, popliteal and peroneal tendons. Patent left SFA and popliteal stent. 30-50% stenosis in the right common femoral and popliteal arteries, 50-74% stenosis in left profunda femoris artery. --> 1 year follow-up.  RABI - 1.2, LABI 1.1  . LOWER EXTREMITY ANGIOGRAM N/A 02/22/2013   Procedure: LOWER EXTREMITY ANGIOGRAM;  Surgeon: Lorretta Harp, MD;  Location: [facility] CATH LAB;  Service: Cardiovascular;  Laterality: N/A;  . LOWER EXTREMITY ANGIOGRAM N/A 07/20/2013   Procedure: LOWER EXTREMITY ANGIOGRAM;  Surgeon: Lorretta Harp, MD;  Location: [facility] CATH LAB;  Service: Cardiovascular;  Laterality: N/A;  . NM MYOVIEW LTD  04/03/2013; 5/26'/2016   Lexiscan: a)  Apical perfusion defect with no ischemia. Consider prior infarct versus breast attenuation.;; b) 5/'16: LOW RISK, Nl EF ~55-65%, No Ischemia /Infarction  . NM MYOVIEW LTD  08/12/2017   Multiple low RISK. Normal study. No EKG changes. 1.5 mm T wave inversions noted in V2-V4 that began 1 minute into stress. -Ended 3 minutes into rest.  EF 55-65%  . PERCUTANEOUS STENT INTERVENTION Right 05/15/2013   Procedure: PERCUTANEOUS STENT INTERVENTION;  Surgeon: Lorretta Harp, MD;  Location: [facility] CATH LAB;  Service: Cardiovascular;  Laterality: Right;  tibioperoneal trunk  . Peroneal Artery Stent  05/15/13   Diamondback rot. atherectomy of high grade segmental popliteal stenosis then Chocolate balloonPTA; then angiosculpt PTA of the prox peroneal with stent-Xpedition   . pv angio  07/20/13   high-grade  calcified popliteal disease with one-vessel runoff via a small anterior tibial artery that has proximal disease as well, no intervention  . TRANSTHORACIC ECHOCARDIOGRAM  10/2016   EF 55-60%. Gr 1 DD. Normal PAP. Aortic Sclerosis.  . TRANSTHORACIC ECHOCARDIOGRAM  02/2011; 04/2015   a) EF 50-55%, mild inf HK;; b)  Nl LV Size & Fxn EF 60-65%. mild LVH, Gr 1 DD.  Marland Kitchen  TUBAL LIGATION     Social History   Occupational History  . Not on file  Tobacco Use  . Smoking status: Never Smoker  . Smokeless tobacco: Never Used  Substance and Sexual Activity  . Alcohol use: No    Comment: rare glass of wine   . Drug use: No  . Sexual activity: Not on file

## 2018-08-21 DIAGNOSIS — R7611 Nonspecific reaction to tuberculin skin test without active tuberculosis: Secondary | ICD-10-CM | POA: Diagnosis not present

## 2018-08-21 DIAGNOSIS — Z79899 Other long term (current) drug therapy: Secondary | ICD-10-CM | POA: Diagnosis not present

## 2018-08-21 DIAGNOSIS — M25551 Pain in right hip: Secondary | ICD-10-CM | POA: Diagnosis not present

## 2018-08-21 DIAGNOSIS — M1990 Unspecified osteoarthritis, unspecified site: Secondary | ICD-10-CM | POA: Diagnosis not present

## 2018-08-21 DIAGNOSIS — M79643 Pain in unspecified hand: Secondary | ICD-10-CM | POA: Diagnosis not present

## 2018-08-24 DIAGNOSIS — Z5181 Encounter for therapeutic drug level monitoring: Secondary | ICD-10-CM | POA: Diagnosis not present

## 2018-08-24 DIAGNOSIS — Z79899 Other long term (current) drug therapy: Secondary | ICD-10-CM | POA: Diagnosis not present

## 2018-08-24 DIAGNOSIS — E118 Type 2 diabetes mellitus with unspecified complications: Secondary | ICD-10-CM | POA: Diagnosis not present

## 2018-08-24 DIAGNOSIS — I10 Essential (primary) hypertension: Secondary | ICD-10-CM | POA: Diagnosis not present

## 2018-08-30 DIAGNOSIS — Z79899 Other long term (current) drug therapy: Secondary | ICD-10-CM | POA: Diagnosis not present

## 2018-08-30 DIAGNOSIS — Z5181 Encounter for therapeutic drug level monitoring: Secondary | ICD-10-CM | POA: Diagnosis not present

## 2018-09-03 NOTE — Progress Notes (Signed)
Cardiology Office Note:    Date:  09/04/2018   ID:  [name], [name] 1933-05-11, MRN [id number]  PCP:  Jani Gravel, MD  Cardiologist:  [name], MD   Referring MD: Jani Gravel, MD   Chief Complaint  Patient presents with  . Medical Clearance    hip surgery    History of Present Illness:    [name] is a 84 y.o. female with a hx of hypertension, chronic diastolic heart failure, CVA, PAD, CAD status post CABG in 1998, and preserved LV function.  She has undergone distal right SFA PTA and stenting of her peroneal artery for lifestyle limiting claudication. She was referred to Dr. Andree Elk for left popliteal and tibial peroneal trunk intervention in 2014 She was last seen by Dr. Gwenlyn Found in clinic on 03/01/2018.  She has chronic dyspnea and multiple hospitalizations for chest pain. She had a negative myoview in 2018. Microvascular disease was suspected and she was maintained on imdur.  She is on statin and Plavix for her PAD.   She returns today for preoperative clearance for hip surgery. She is under a lot of stress while taking care of her husband with dementia. She is compliant on all medications. She continues to have intermittent chest pressure when bathing or helping care for her husband. She generally takes 1 sublingual nitro, then burps/belches and her chest pain is relieved. She takes nitro about 3-4 times weekly. She states this is similar pain that she always has, but is concerned about her heart. She is also experiencing increased swelling in her legs. She started taking daily prednisone for her RA in conjunction with methotrexate. We discussed how the prednisone may be influencing her fluid balance. She is trying to get her husband into assisted living prior to her hip surgery.    Past Medical History:  Diagnosis Date  . Atherosclerotic heart disease native coronary artery w/angina pectoris ([place]) 1992   a. 1992 s/p PTCA of LAD;  b. 1998 CABG x 3; c. 07/2001 Cath: Sev LM/LAD/LCX  dzs, 3/3 patent grafts; d. 11/2013 Cath: stable graft anatomy; e. 10/2016 Cath: native 3VD, VG->OM nl, LIMA->LAD nl, VG->Diag 55.  . Bradycardia, mild briefly to the 40s, asymptomatic 05/16/2013  . Carotid arterial disease ([place])    a. 05/2014 Carotid U/S: No signif bilat ICA stenosis, >60% L ECA.  Marland Kitchen Chronic anemia   . Chronic diastolic CHF (congestive heart failure) ([place])    a. 10/2016 Echo: Ef 55-60%, no rwma, Gr1 DD, triv AI, PASP 59mmHg, atrial septal aneurysm.  . Chronic diastolic CHF (congestive heart failure), NYHA class 2 ([place]) 08/11/2017  . DM (diabetes mellitus), type 2 with peripheral vascular complications (HCC)    On Oral medications.  . Essential hypertension   . GERD (gastroesophageal reflux disease)   . Hyperlipidemia with target LDL less than 70   . PAD (peripheral artery disease) ([place]) 05/2013; 09/2013   a. severe calcified POP-1 & TP Trunk Dz bilat;  b . 05/2013 Diamondback Rot Athrectomy (R POP) --> PTA R Pop, DES to R Peroneal;  c. 09/2013 PTA/Stenting of L Pop [name] - retrograde access);  d. 04/2014 ABI's: R/L: 1.1/1.1.  Marland Kitchen Peptic ulcer disease   . S/P CABG x 3 1998   LIMA-LAD, SVG-OM, SVG-D1  . Severe claudication ([place]) 05/2013   Referred for Peripheral Angio (Dr. Gwenlyn Found --> Dr. Brunetta Jeans in [place])    Past Surgical History:  Procedure Laterality Date  . ATHERECTOMY Right 05/15/2013   Procedure: ATHERECTOMY;  Surgeon: Lorretta Harp, MD;  Location: [facility] CATH LAB;  Service: Cardiovascular;  Laterality: Right;  popliteal  . BACK SURGERY    . CARDIAC CATHETERIZATION  07/17/01   2 v CAD with LM, circ, LAD, obtuse diag, mod stenosis of diag vein graft , mild stenosis of marg vein graft, nl EF, medical treatment  . CARDIAC CATHETERIZATION  11/2013   Widely patent LIMA-LAD & SVG-OM with mild progression of proximal stenosis ~40-50% in SVG-D1; Widely patent native RCA with known severe native LCA disease  . CARDIAC CATHETERIZATION N/A 11/01/2016   Procedure: Left Heart Cath and  Cors/Grafts Angiography;  Surgeon: [name], MD;  Location: [facility] CV LAB;  Service: Cardiovascular: Hemodynamics: High LVEDP!.  Cors/Grafts: LM 55%, o-p LAD 100% then after D1 -> LIMA-LAD patent, SVG-Diag ~55%. small RI wiht ~70% ostial. O-p Cx 80% - pCx 70% --> SVG-bifurcating OM patent. -- med Rx  . CORONARY ANGIOPLASTY  1992   PCI to LAD  . CORONARY ARTERY BYPASS GRAFT  1998   LIMA-LAD, SVG-diagonal (none roughly 50% stenosis), SVG-OM  . LEFT HEART CATHETERIZATION WITH CORONARY ANGIOGRAM N/A 11/27/2013   Procedure: LEFT HEART CATHETERIZATION WITH CORONARY ANGIOGRAM;  Surgeon: [name], MD;  Location: [facility] CATH LAB;  Service: Cardiovascular;  Laterality: N/A;  . LOW EXTREMITY DOPPLERS/ABI  10/2016   Patent right SFA, popliteal and peroneal tendons. Patent left SFA and popliteal stent. 30-50% stenosis in the right common femoral and popliteal arteries, 50-74% stenosis in left profunda femoris artery. --> 1 year follow-up.  RABI - 1.2, LABI 1.1  . LOWER EXTREMITY ANGIOGRAM N/A 02/22/2013   Procedure: LOWER EXTREMITY ANGIOGRAM;  Surgeon: Lorretta Harp, MD;  Location: [facility] CATH LAB;  Service: Cardiovascular;  Laterality: N/A;  . LOWER EXTREMITY ANGIOGRAM N/A 07/20/2013   Procedure: LOWER EXTREMITY ANGIOGRAM;  Surgeon: Lorretta Harp, MD;  Location: [facility] CATH LAB;  Service: Cardiovascular;  Laterality: N/A;  . NM MYOVIEW LTD  04/03/2013; 5/26'/2016   Lexiscan: a)  Apical perfusion defect with no ischemia. Consider prior infarct versus breast attenuation.;; b) 5/'16: LOW RISK, Nl EF ~55-65%, No Ischemia /Infarction  . NM MYOVIEW LTD  08/12/2017   Multiple low RISK. Normal study. No EKG changes. 1.5 mm T wave inversions noted in V2-V4 that began 1 minute into stress. -Ended 3 minutes into rest.  EF 55-65%  . PERCUTANEOUS STENT INTERVENTION Right 05/15/2013   Procedure: PERCUTANEOUS STENT INTERVENTION;  Surgeon: Lorretta Harp, MD;  Location: [facility] CATH LAB;  Service: Cardiovascular;   Laterality: Right;  tibioperoneal trunk  . Peroneal Artery Stent  05/15/13   Diamondback rot. atherectomy of high grade segmental popliteal stenosis then Chocolate balloonPTA; then angiosculpt PTA of the prox peroneal with stent-Xpedition   . pv angio  07/20/13   high-grade calcified popliteal disease with one-vessel runoff via a small anterior tibial artery that has proximal disease as well, no intervention  . TRANSTHORACIC ECHOCARDIOGRAM  10/2016   EF 55-60%. Gr 1 DD. Normal PAP. Aortic Sclerosis.  . TRANSTHORACIC ECHOCARDIOGRAM  02/2011; 04/2015   a) EF 50-55%, mild inf HK;; b)  Nl LV Size & Fxn EF 60-65%. mild LVH, Gr 1 DD.  . TUBAL LIGATION      Current Medications: Current Meds  Medication Sig  . clopidogrel (PLAVIX) 75 MG tablet Take 75 mg by mouth daily.  . Coenzyme Q10 (CO Q 10 PO) Take 1 tablet by mouth at bedtime.   Marland Kitchen ezetimibe (ZETIA) 10 MG tablet Take 10 mg by  mouth daily.  . folic acid (FOLVITE) 1 MG tablet Take 1 mg by mouth daily.  . furosemide (LASIX) 20 MG tablet Take 1.5 tablets (30 mg total) by mouth every morning.  . gabapentin (NEURONTIN) 300 MG capsule Take 1 capsule (300 mg total) by mouth 3 (three) times daily.  Marland Kitchen glimepiride (AMARYL) 1 MG tablet 1 TABLET WITH BREAKFAST OR THE FIRST MAIN MEAL OF THE DAY ONCE A DAY FOR DIABETES ORALLY 30 DAY(S)  . hydroxychloroquine (PLAQUENIL) 200 MG tablet Take 400 mg by mouth daily.  . isosorbide mononitrate (IMDUR) 60 MG 24 hr tablet TAKE 1 TABLET (60 MG TOTAL) BY MOUTH DAILY.  Marland Kitchen LORazepam (ATIVAN) 0.5 MG tablet Take 0.5 mg by mouth every 6 (six) hours as needed for anxiety or sleep.   . methotrexate (RHEUMATREX) 2.5 MG tablet Take 4 tablets by mouth once a week.  . NONFORMULARY OR COMPOUNDED ITEM Nitro gel 0.125 % bid x 6-8 weeks.  . pantoprazole (PROTONIX) 40 MG tablet Take 1 tablet (40 mg total) by mouth 2 (two) times daily before a meal.  . predniSONE (DELTASONE) 5 MG tablet Take 7.5 mg by mouth daily with breakfast.   .  rosuvastatin (CRESTOR) 40 MG tablet Take 40 mg by mouth daily.  Marland Kitchen sulfaSALAzine (AZULFIDINE) 500 MG EC tablet TAKE 1 TABLET BY MOUTH DAILY FOR 7 DAYS THEN 2 TABS DAILY  . traMADol (ULTRAM) 50 MG tablet Take 1-2 tablets (50-100 mg total) by mouth every 6 (six) hours as needed.  . [DISCONTINUED] acetaminophen-codeine (TYLENOL #3) 300-30 MG tablet TAKE 1 TABLET BY MOUTH EVERY 6 HOURS AS NEEDED FOR PAIN FOR UP TO 7 DAYS  . [DISCONTINUED] DULoxetine (CYMBALTA) 20 MG capsule Take 1 capsule (20 mg total) by mouth daily.  . [DISCONTINUED] furosemide (LASIX) 20 MG tablet TAKE 1 TABLET BY MOUTH EVERY DAY FOR FLUID  . [DISCONTINUED] isosorbide mononitrate (IMDUR) 60 MG 24 hr tablet Take 60 mg by mouth daily.  . [DISCONTINUED] naproxen sodium (ANAPROX) 220 MG tablet Take 440 mg by mouth 2 (two) times daily as needed.  . [DISCONTINUED] TRULICITY 1.5 ER/7.4YC SOPN Inject 1.5 mg into the skin once a week.     Allergies:   Fish allergy; Ivp dye [iodinated diagnostic agents]; Latex; Shellfish allergy; Shellfish-derived products; Other; and Metformin   Social History   Socioeconomic History  . Marital status: Married    Spouse name: Not on file  . Number of children: Not on file  . Years of education: Not on file  . Highest education level: Not on file  Occupational History  . Not on file  Social Needs  . Financial resource strain: Not on file  . Food insecurity:    Worry: Not on file    Inability: Not on file  . Transportation needs:    Medical: Not on file    Non-medical: Not on file  Tobacco Use  . Smoking status: Never Smoker  . Smokeless tobacco: Never Used  Substance and Sexual Activity  . Alcohol use: No    Comment: rare glass of wine   . Drug use: No  . Sexual activity: Not on file  Lifestyle  . Physical activity:    Days per week: Not on file    Minutes per session: Not on file  . Stress: Not on file  Relationships  . Social connections:    Talks on phone: Not on file    Gets  together: Not on file    Attends religious service: Not on  file    Active member of club or organization: Not on file    Attends meetings of clubs or organizations: Not on file    Relationship status: Not on file  Other Topics Concern  . Not on file  Social History Narrative    She is a married mother of 67, grandmother of 39, great grandmother of 19.    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    She does not really get a lot of exercise,    She does not smoke or drink EtOH.     Family History: The patient's family history includes Hyperlipidemia in her father and mother; Hypertension in her father and mother.  ROS:   Please see the history of present illness.     All other systems reviewed and are negative.  EKGs/Labs/Other Studies Reviewed:    The following studies were reviewed today:  Echo 11/02/16: Study Conclusions - Left ventricle: The cavity size was normal. There was mild focal   basal hypertrophy of the septum. Systolic function was normal.   The estimated ejection fraction was in the range of 55% to 60%.   Wall motion was normal; there were no regional wall motion   abnormalities. Doppler parameters are consistent with abnormal   left ventricular relaxation (grade 1 diastolic dysfunction). - Aortic valve: There was trivial regurgitation. - Mitral valve: Calcified annulus. - Atrial septum: There was an atrial septal aneurysm. - Pulmonary arteries: Systolic pressure was mildly increased. PA   peak pressure: 35 mm Hg (S).  Impressions: - Normal LV systolic function; grade 1 diastolic dysfunction; trace   AI; mild TR; mildly elevated pulmonary pressure.   EKG:  EKG is ordered today.  The ekg ordered today demonstrates sinus rhythm  Recent Labs: 05/30/2018: ALT 19; BUN 17; Creat 0.81; Potassium 4.3; Sodium 143  Recent Lipid Panel    Component Value Date/Time   CHOL 138 05/19/2017 1254   TRIG 55 05/19/2017 1254   HDL 54 05/19/2017 1254    CHOLHDL 2.6 05/19/2017 1254   CHOLHDL 3.6 11/02/2016 0640   VLDL 11 11/02/2016 0640   LDLCALC 73 05/19/2017 1254    Physical Exam:    VS:  BP 118/70   Pulse 67   Ht 5\' 3"  (1.6 m)   Wt 162 lb 12.8 oz (73.8 kg)   SpO2 99%   BMI 28.84 kg/m     Wt Readings from Last 3 Encounters:  09/04/18 162 lb 12.8 oz (73.8 kg)  06/21/18 165 lb (74.8 kg)  05/30/18 158 lb 12.8 oz (72 kg)     GEN: Well nourished, well developed in no acute distress HEENT: Normal NECK: No JVD; No carotid bruits CARDIAC: RRR, no murmurs, rubs, gallops RESPIRATORY:  Clear to auscultation without rales, wheezing or rhonchi  ABDOMEN: Soft, non-tender, non-distended MUSCULOSKELETAL:  Trace to 1+ B LE edema; No deformity  SKIN: Warm and dry NEUROLOGIC:  Alert and oriented x 3 PSYCHIATRIC:  Normal affect   ASSESSMENT:    1. Chest pain, moderate coronary artery risk   2. Chronic diastolic CHF (congestive heart failure), NYHA class 2 ([place])   3. Accelerated hypertension with diastolic congestive heart failure, NYHA class 3 ([place])   4. Swelling of lower extremity   5. PAD,severe calcified pop and tibial peroneal trunk disease bilateraly   6. Preoperative clearance    PLAN:    In order of problems listed above:  Chest pain, moderate coronary artery risk - Plan: ECHOCARDIOGRAM COMPLETE,  MYOCARDIAL PERFUSION IMAGING It is unclear if this is the same chest pain that she has had in the past. Chest pressure is relieved with nitro. Will obtain repeat lexiscan myoview for preoperative clearance.   Chronic diastolic CHF (congestive heart failure), NYHA class 2 (HCC) Accelerated hypertension with diastolic congestive heart failure, NYHA class 3 (HCC) Swelling of lower extremity  She has been taking daily prednisone for her RA, which may be influencing her fluid balance. I increased her lasix to 30 mg daily. Will repeat BMP in 2 weeks. Will also obtain echocardiogram.    PAD,severe calcified pop and tibial peroneal trunk  disease bilateraly Appears stable. Follows with Dr. Gwenlyn Found.    Preoperative clearance Given her history of disease and chest pressure relieved with nitro 3-4 times weekly with exertion, will obtain repeat lexiscan myoview. Unclear if her lower extremity swelling is due to prednisone vs a change in function. She denies dyspnea and orthopnea. Will obtain echocardiogram to evaluate structure and function. If both stable, will clear for hip surgeryl   Will see back in 1 year with [name].   Medication Adjustments/Labs and Tests Ordered: Current medicines are reviewed at length with the patient today.  Concerns regarding medicines are outlined above.  Orders Placed This Encounter  Procedures  . MYOCARDIAL PERFUSION IMAGING  . ECHOCARDIOGRAM COMPLETE   Meds ordered this encounter  Medications  . furosemide (LASIX) 20 MG tablet    Sig: Take 1.5 tablets (30 mg total) by mouth every morning.    Dispense:  30 tablet    Refill:  1    Signed, Ledora Bottcher, [place]  09/04/2018 4:58 PM    [facility]

## 2018-09-04 ENCOUNTER — Encounter: Payer: Self-pay | Admitting: Physician Assistant

## 2018-09-04 ENCOUNTER — Ambulatory Visit (INDEPENDENT_AMBULATORY_CARE_PROVIDER_SITE_OTHER): Payer: Medicare Other | Admitting: Physician Assistant

## 2018-09-04 VITALS — BP 118/70 | HR 67 | Ht 63.0 in | Wt 162.8 lb

## 2018-09-04 DIAGNOSIS — Z01818 Encounter for other preprocedural examination: Secondary | ICD-10-CM

## 2018-09-04 DIAGNOSIS — I739 Peripheral vascular disease, unspecified: Secondary | ICD-10-CM | POA: Diagnosis not present

## 2018-09-04 DIAGNOSIS — R079 Chest pain, unspecified: Secondary | ICD-10-CM | POA: Diagnosis not present

## 2018-09-04 DIAGNOSIS — I5030 Unspecified diastolic (congestive) heart failure: Secondary | ICD-10-CM | POA: Diagnosis not present

## 2018-09-04 DIAGNOSIS — I5032 Chronic diastolic (congestive) heart failure: Secondary | ICD-10-CM

## 2018-09-04 DIAGNOSIS — I110 Hypertensive heart disease with heart failure: Secondary | ICD-10-CM

## 2018-09-04 DIAGNOSIS — M7989 Other specified soft tissue disorders: Secondary | ICD-10-CM

## 2018-09-04 MED ORDER — FUROSEMIDE 20 MG PO TABS: 30.0000 mg | ORAL_TABLET | ORAL | 1 refills | Status: DC

## 2018-09-04 NOTE — Patient Instructions (Signed)
Medication Instructions:  INCREASE- Lasix 30 mg(1 1/2 tablets) in the morning   If you need a refill on your cardiac medications before your next appointment, please call your pharmacy.  Labwork: None Ordered   Testing/Procedures: Your physician has requested that you have an echocardiogram. Echocardiography is a painless test that uses sound waves to create images of your heart. It provides your doctor with information about the size and shape of your heart and how well your heart's chambers and valves are working. This procedure takes approximately one hour. There are no restrictions for this procedure.  Your physician has requested that you have a lexiscan myoview. For further information please visit [email or web address]. Please follow instruction sheet, as given.   Follow-Up: Your physician wants you to follow-up in: 1 Year with Ellyn Hack. You should receive a reminder letter in the mail two months in advance. If you do not receive a letter, please call our office in [phone] to schedule your follow-up appointment.     Thank you for choosing [facility] at [facility]!!

## 2018-09-05 DIAGNOSIS — Z23 Encounter for immunization: Secondary | ICD-10-CM | POA: Diagnosis not present

## 2018-09-05 DIAGNOSIS — I10 Essential (primary) hypertension: Secondary | ICD-10-CM | POA: Diagnosis not present

## 2018-09-05 DIAGNOSIS — E785 Hyperlipidemia, unspecified: Secondary | ICD-10-CM | POA: Diagnosis not present

## 2018-09-05 DIAGNOSIS — E118 Type 2 diabetes mellitus with unspecified complications: Secondary | ICD-10-CM | POA: Diagnosis not present

## 2018-09-05 DIAGNOSIS — K219 Gastro-esophageal reflux disease without esophagitis: Secondary | ICD-10-CM | POA: Diagnosis not present

## 2018-09-05 DIAGNOSIS — I2510 Atherosclerotic heart disease of native coronary artery without angina pectoris: Secondary | ICD-10-CM | POA: Diagnosis not present

## 2018-09-06 ENCOUNTER — Other Ambulatory Visit (INDEPENDENT_AMBULATORY_CARE_PROVIDER_SITE_OTHER): Payer: Self-pay | Admitting: Orthopaedic Surgery

## 2018-09-06 ENCOUNTER — Telehealth (INDEPENDENT_AMBULATORY_CARE_PROVIDER_SITE_OTHER): Payer: Self-pay | Admitting: Orthopaedic Surgery

## 2018-09-06 NOTE — Addendum Note (Signed)
Addended by: Zebedee Iba on: 09/06/2018 02:33 PM   Modules accepted: Orders

## 2018-09-06 NOTE — Telephone Encounter (Signed)
Patient called needing Rx refilled (Tramadol) The number to contact patient is [phone]

## 2018-09-07 MED ORDER — TRAMADOL HCL 50 MG PO TABS: 50.0000 mg | ORAL_TABLET | Freq: Four times a day (QID) | ORAL | 0 refills | Status: DC | PRN

## 2018-09-07 NOTE — Telephone Encounter (Signed)
Please advise 

## 2018-09-08 ENCOUNTER — Ambulatory Visit (HOSPITAL_COMMUNITY): Payer: Medicare Other

## 2018-09-11 DIAGNOSIS — H04123 Dry eye syndrome of bilateral lacrimal glands: Secondary | ICD-10-CM | POA: Diagnosis not present

## 2018-09-11 DIAGNOSIS — H26493 Other secondary cataract, bilateral: Secondary | ICD-10-CM | POA: Diagnosis not present

## 2018-09-11 DIAGNOSIS — H43813 Vitreous degeneration, bilateral: Secondary | ICD-10-CM | POA: Diagnosis not present

## 2018-09-11 DIAGNOSIS — E119 Type 2 diabetes mellitus without complications: Secondary | ICD-10-CM | POA: Diagnosis not present

## 2018-09-12 ENCOUNTER — Encounter (INDEPENDENT_AMBULATORY_CARE_PROVIDER_SITE_OTHER): Payer: Self-pay | Admitting: Orthopaedic Surgery

## 2018-09-12 ENCOUNTER — Ambulatory Visit (INDEPENDENT_AMBULATORY_CARE_PROVIDER_SITE_OTHER): Payer: Medicare Other | Admitting: Orthopaedic Surgery

## 2018-09-12 DIAGNOSIS — M1611 Unilateral primary osteoarthritis, right hip: Secondary | ICD-10-CM | POA: Diagnosis not present

## 2018-09-12 HISTORY — PX: NM MYOVIEW LTD: HXRAD82

## 2018-09-12 HISTORY — PX: TRANSTHORACIC ECHOCARDIOGRAM: SHX275

## 2018-09-12 NOTE — Progress Notes (Signed)
The patient comes in today again to discuss hip replacement surgery.  She has known well-established osteoarthritis of her right hip with superimposed synovitis from her rheumatoid disease.  She was reluctant to schedule surgery before due to finding placement for her husband who has dementia.  That is now happening.  She would like to consider hip replacement surgery for later this month.  She does need cardiac clearance and has been sent to her cardiologist and she actually is having an echo this week and another study as well.  She understands that when she has the studies done that we can schedule her for right hip replacement.  This all depends on her cardiac clearance.  She is ambulate with a cane.  On exam she has significant pain with internal extra rotation of her right hip at her last visit we went over in detail what the surgery involves including a thorough discussion the risk and benefits and the operative and nonoperative treatment modalities.  We have discussed in general her intraoperative and postoperative course were involved.  She is someone that would need skilled nursing afterwards and refers to potentially go to [facility].  All questions concerns were answered and addressed.  Since we have her cardiac clearance we can schedule her for a right total hip arthroplasty.

## 2018-09-13 ENCOUNTER — Telehealth (HOSPITAL_COMMUNITY): Payer: Self-pay

## 2018-09-13 NOTE — Telephone Encounter (Signed)
Encounter complete. 

## 2018-09-14 ENCOUNTER — Ambulatory Visit (HOSPITAL_COMMUNITY): Payer: Medicare Other | Attending: Cardiovascular Disease

## 2018-09-14 ENCOUNTER — Other Ambulatory Visit: Payer: Self-pay

## 2018-09-14 DIAGNOSIS — I071 Rheumatic tricuspid insufficiency: Secondary | ICD-10-CM | POA: Diagnosis not present

## 2018-09-14 DIAGNOSIS — E119 Type 2 diabetes mellitus without complications: Secondary | ICD-10-CM | POA: Diagnosis not present

## 2018-09-14 DIAGNOSIS — R079 Chest pain, unspecified: Secondary | ICD-10-CM | POA: Insufficient documentation

## 2018-09-14 DIAGNOSIS — E785 Hyperlipidemia, unspecified: Secondary | ICD-10-CM | POA: Diagnosis not present

## 2018-09-14 DIAGNOSIS — I110 Hypertensive heart disease with heart failure: Secondary | ICD-10-CM | POA: Insufficient documentation

## 2018-09-14 DIAGNOSIS — R001 Bradycardia, unspecified: Secondary | ICD-10-CM | POA: Diagnosis not present

## 2018-09-14 DIAGNOSIS — I509 Heart failure, unspecified: Secondary | ICD-10-CM | POA: Diagnosis not present

## 2018-09-14 DIAGNOSIS — M7989 Other specified soft tissue disorders: Secondary | ICD-10-CM | POA: Diagnosis not present

## 2018-09-14 DIAGNOSIS — I2510 Atherosclerotic heart disease of native coronary artery without angina pectoris: Secondary | ICD-10-CM | POA: Insufficient documentation

## 2018-09-15 ENCOUNTER — Ambulatory Visit (HOSPITAL_COMMUNITY)
Admission: RE | Admit: 2018-09-15 | Discharge: 2018-09-15 | Disposition: A | Discharge status: Home / Self Care | Payer: Medicare Other | Source: Ambulatory Visit | Attending: Cardiology | Admitting: Cardiology

## 2018-09-15 DIAGNOSIS — R079 Chest pain, unspecified: Secondary | ICD-10-CM

## 2018-09-15 DIAGNOSIS — M7989 Other specified soft tissue disorders: Secondary | ICD-10-CM | POA: Diagnosis not present

## 2018-09-15 LAB — MYOCARDIAL PERFUSION IMAGING
CHL CUP NUCLEAR SRS: 2
CHL CUP NUCLEAR SSS: 3
LV sys vol: 37 mL
LVDIAVOL: 76 mL (ref 46–106)
Peak HR: 95 {beats}/min
Rest HR: 57 {beats}/min
SDS: 1
TID: 1.02

## 2018-09-15 MED ORDER — REGADENOSON 0.4 MG/5ML IV SOLN
0.4000 mg | Freq: Once | INTRAVENOUS | Status: AC
  Administered 2018-09-15: 0.4 mg via INTRAVENOUS

## 2018-09-15 MED ORDER — TECHNETIUM TC 99M TETROFOSMIN IV KIT
30.8000 | PACK | Freq: Once | INTRAVENOUS | Status: AC | PRN
  Administered 2018-09-15: 30.8 via INTRAVENOUS
  Filled 2018-09-15: qty 31

## 2018-09-15 MED ORDER — TECHNETIUM TC 99M TETROFOSMIN IV KIT
10.6000 | PACK | Freq: Once | INTRAVENOUS | Status: AC | PRN
  Administered 2018-09-15: 10.6 via INTRAVENOUS
  Filled 2018-09-15: qty 11

## 2018-09-15 MED ORDER — AMINOPHYLLINE 25 MG/ML IV SOLN
75.0000 mg | Freq: Once | INTRAVENOUS | Status: AC
  Administered 2018-09-15: 75 mg via INTRAVENOUS

## 2018-09-19 ENCOUNTER — Telehealth (INDEPENDENT_AMBULATORY_CARE_PROVIDER_SITE_OTHER): Payer: Self-pay

## 2018-09-19 ENCOUNTER — Telehealth: Payer: Self-pay | Admitting: Physician Assistant

## 2018-09-19 NOTE — Telephone Encounter (Signed)
New Message   Patient is calling to obtain results of the echocardiogram. She is also requesting results be sent to Dr. Rush Farmer. Please call to discuss.

## 2018-09-19 NOTE — Telephone Encounter (Signed)
Spoke with patient and let her know I would send over a copy of her echo, lexiscan and last office note to Dr Trevor Mace office. Patient thanked me and voiced understanding.  Spoke with Dr Pattricia Boss assistant and they have access to epic so they can see our notes.

## 2018-09-19 NOTE — Telephone Encounter (Signed)
Cardiologist has cleared patient for surgery. Her note is in the chart

## 2018-09-19 NOTE — Telephone Encounter (Signed)
Pt updated with Stress test and ECHO results. Verbalized understanding and states she need a clearance sent to Dr. Rush Farmer office based on reports. Will route to Fabian Sharp, [place] Nurse.

## 2018-09-25 DIAGNOSIS — M79643 Pain in unspecified hand: Secondary | ICD-10-CM | POA: Diagnosis not present

## 2018-09-25 DIAGNOSIS — Z13820 Encounter for screening for osteoporosis: Secondary | ICD-10-CM | POA: Diagnosis not present

## 2018-09-25 DIAGNOSIS — I10 Essential (primary) hypertension: Secondary | ICD-10-CM | POA: Diagnosis not present

## 2018-09-25 DIAGNOSIS — M1990 Unspecified osteoarthritis, unspecified site: Secondary | ICD-10-CM | POA: Diagnosis not present

## 2018-09-25 DIAGNOSIS — R7611 Nonspecific reaction to tuberculin skin test without active tuberculosis: Secondary | ICD-10-CM | POA: Diagnosis not present

## 2018-09-25 DIAGNOSIS — M25551 Pain in right hip: Secondary | ICD-10-CM | POA: Diagnosis not present

## 2018-09-25 DIAGNOSIS — Z79899 Other long term (current) drug therapy: Secondary | ICD-10-CM | POA: Diagnosis not present

## 2018-09-27 ENCOUNTER — Other Ambulatory Visit (INDEPENDENT_AMBULATORY_CARE_PROVIDER_SITE_OTHER): Payer: Self-pay | Admitting: Physician Assistant

## 2018-09-29 ENCOUNTER — Other Ambulatory Visit: Payer: Self-pay | Admitting: Orthopaedic Surgery

## 2018-09-29 NOTE — Care Plan (Signed)
Spoke with patient prior to surgery. She will need STSNF for rehab. I have sent referral to [facility] for possible admission.

## 2018-09-29 NOTE — Patient Instructions (Signed)
[name]  09/29/2018   Your procedure is scheduled on: 10-06-18   Report to [facility] Main  Entrance    Report to admitting at 6:30AM    Call this number if you have problems the morning of surgery [phone]     Remember: Do not eat food or drink liquids :After Midnight. BRUSH YOUR TEETH MORNING OF SURGERY AND RINSE YOUR MOUTH OUT, NO CHEWING GUM CANDY OR MINTS.     Take these medicines the morning of surgery with A SIP OF WATER: tylenol if needed. Carvedilol, gabapentin, isosorbide, pantoprazole, prednisone    DO NOT TAKE ANY DIABETIC MEDICATIONS DAY OF YOUR SURGERY !!!                               You may not have any metal on your body including hair pins and              piercings  Do not wear jewelry, make-up, lotions, powders or perfumes, deodorant             Do not wear nail polish.  Do not shave  48 hours prior to surgery.                Do not bring valuables to the hospital. [place].  Contacts, dentures or bridgework may not be worn into surgery.  Leave suitcase in the car. After surgery it may be brought to your room.                Please read over the following fact sheets you were given: _____________________________________________________________________             [facility] - Preparing for Surgery Before surgery, you can play an important role.  Because skin is not sterile, your skin needs to be as free of germs as possible.  You can reduce the number of germs on your skin by washing with CHG (chlorahexidine gluconate) soap before surgery.  CHG is an antiseptic cleaner which kills germs and bonds with the skin to continue killing germs even after washing. Please DO NOT use if you have an allergy to CHG or antibacterial soaps.  If your skin becomes reddened/irritated stop using the CHG and inform your nurse when you arrive at Short Stay. Do not shave (including legs and  underarms) for at least 48 hours prior to the first CHG shower.  You may shave your face/neck. Please follow these instructions carefully:  1.  Shower with CHG Soap the night before surgery and the  morning of Surgery.  2.  If you choose to wash your hair, wash your hair first as usual with your  normal  shampoo.  3.  After you shampoo, rinse your hair and body thoroughly to remove the  shampoo.                           4.  Use CHG as you would any other liquid soap.  You can apply chg directly  to the skin and wash                       Gently with a scrungie or clean  washcloth.  5.  Apply the CHG Soap to your body ONLY FROM THE NECK DOWN.   Do not use on face/ open                           Wound or open sores. Avoid contact with eyes, ears mouth and genitals (private parts).                       Wash face,  Genitals (private parts) with your normal soap.             6.  Wash thoroughly, paying special attention to the area where your surgery  will be performed.  7.  Thoroughly rinse your body with warm water from the neck down.  8.  DO NOT shower/wash with your normal soap after using and rinsing off  the CHG Soap.                9.  Pat yourself dry with a clean towel.            10.  Wear clean pajamas.            11.  Place clean sheets on your bed the night of your first shower and do not  sleep with pets. Day of Surgery : Do not apply any lotions/deodorants the morning of surgery.  Please wear clean clothes to the hospital/surgery center.  FAILURE TO FOLLOW THESE INSTRUCTIONS MAY RESULT IN THE CANCELLATION OF YOUR SURGERY PATIENT SIGNATURE_________________________________  NURSE SIGNATURE__________________________________  ________________________________________________________________________   Adam Phenix  An incentive spirometer is a tool that can help keep your lungs clear and active. This tool measures how well you are filling your lungs with each breath. Taking  long deep breaths may help reverse or decrease the chance of developing breathing (pulmonary) problems (especially infection) following:  A long period of time when you are unable to move or be active. BEFORE THE PROCEDURE   If the spirometer includes an indicator to show your best effort, your nurse or respiratory therapist will set it to a desired goal.  If possible, sit up straight or lean slightly forward. Try not to slouch.  Hold the incentive spirometer in an upright position. INSTRUCTIONS FOR USE  1. Sit on the edge of your bed if possible, or sit up as far as you can in bed or on a chair. 2. Hold the incentive spirometer in an upright position. 3. Breathe out normally. 4. Place the mouthpiece in your mouth and seal your lips tightly around it. 5. Breathe in slowly and as deeply as possible, raising the piston or the ball toward the top of the column. 6. Hold your breath for 3-5 seconds or for as long as possible. Allow the piston or ball to fall to the bottom of the column. 7. Remove the mouthpiece from your mouth and breathe out normally. 8. Rest for a few seconds and repeat Steps 1 through 7 at least 10 times every 1-2 hours when you are awake. Take your time and take a few normal breaths between deep breaths. 9. The spirometer may include an indicator to show your best effort. Use the indicator as a goal to work toward during each repetition. 10. After each set of 10 deep breaths, practice coughing to be sure your lungs are clear. If you have an incision (the cut made at the time of surgery), support your incision when coughing by  placing a pillow or rolled up towels firmly against it. Once you are able to get out of bed, walk around indoors and cough well. You may stop using the incentive spirometer when instructed by your caregiver.  RISKS AND COMPLICATIONS  Take your time so you do not get dizzy or light-headed.  If you are in pain, you may need to take or ask for pain  medication before doing incentive spirometry. It is harder to take a deep breath if you are having pain. AFTER USE  Rest and breathe slowly and easily.  It can be helpful to keep track of a log of your progress. Your caregiver can provide you with a simple table to help with this. If you are using the spirometer at home, follow these instructions: [place] IF:   You are having difficultly using the spirometer.  You have trouble using the spirometer as often as instructed.  Your pain medication is not giving enough relief while using the spirometer.  You develop fever of 100.5 F (38.1 C) or higher. SEEK IMMEDIATE MEDICAL CARE IF:   You cough up bloody sputum that had not been present before.  You develop fever of 102 F (38.9 C) or greater.  You develop worsening pain at or near the incision site. MAKE SURE YOU:   Understand these instructions.  Will watch your condition.  Will get help right away if you are not doing well or get worse. Document Released: 04/11/2007 Document Revised: 02/21/2012 Document Reviewed: 06/12/2007 [facility] Patient Information 2014 [place], [place].   ________________________________________________________________________

## 2018-09-29 NOTE — Progress Notes (Signed)
Stress Test 09-15-18 epic   ECHO 09-14-18 epic   LOV cardiology Fabian Sharp, [place] 09-04-18 epic   EKG 09-04-18 epic

## 2018-10-03 ENCOUNTER — Encounter (HOSPITAL_COMMUNITY)
Admission: RE | Admit: 2018-10-03 | Discharge: 2018-10-03 | Disposition: A | Discharge status: Home / Self Care | Payer: Medicare Other | Source: Ambulatory Visit | Attending: Orthopaedic Surgery | Admitting: Orthopaedic Surgery

## 2018-10-03 ENCOUNTER — Other Ambulatory Visit: Payer: Self-pay

## 2018-10-03 DIAGNOSIS — Z01812 Encounter for preprocedural laboratory examination: Secondary | ICD-10-CM | POA: Diagnosis not present

## 2018-10-03 DIAGNOSIS — E1351 Other specified diabetes mellitus with diabetic peripheral angiopathy without gangrene: Secondary | ICD-10-CM | POA: Diagnosis not present

## 2018-10-03 LAB — GLUCOSE, CAPILLARY: GLUCOSE-CAPILLARY: 88 mg/dL (ref 70–99)

## 2018-10-03 LAB — SURGICAL PCR SCREEN
MRSA, PCR: NEGATIVE
STAPHYLOCOCCUS AUREUS: NEGATIVE

## 2018-10-03 NOTE — Progress Notes (Signed)
RN LVMM for Dr Loletha Grayer. [facility] surgery scheduler, Kandice Hams. RN requesting copy on Plavix instructions for upcoming surgery and requested that their office reach out to patient to make aware of Plavix instructions as well. RN also explained on voicemail  that at pre-op appt, patient complained of swelling in her left lower extremity [patient has hx significant for PVD and arthritis, and LL edema] and that she reports her PCP had just discontinued her methotrexate and sulfasalazine 2 weeks ago for upcoming surgery. RN told patient that when contacting Dr Trevor Mace office , RN would report her complaint to them so that they may contact her as they see fit . Fax number and return phone number included in VMM. RN to f/u as necessary   FYI exhibited negative Bevelyn Buckles' sign (denied pain in extremity even with flexion and extension), no new change in skin color of extremity, dorsalis pedal pulse difficult to palpate due to edema (patient has hx significant for LLE edema, antritis, and PVD)  . Patient advised to update her PC about the swelling

## 2018-10-03 NOTE — Progress Notes (Addendum)
LOV Dr. Lahoma Rocker 09-25-18 on chart from [place] [includes CBCdiff and CMP ]  LOV Dr. Janie Morning  09-05-18 on chart from [facility]

## 2018-10-04 LAB — HEMOGLOBIN A1C
HEMOGLOBIN A1C: 6.6 % — AB (ref 4.8–5.6)
Mean Plasma Glucose: 143 mg/dL

## 2018-10-04 NOTE — Progress Notes (Signed)
RN had not heard back from Dr Trevor Mace office. RN called again and was able to speak with Sherri Billi g (surgery scheduler fro Dr Ninfa Linden) . RN again mentioned that at pre-op appt, patient reported that she had not been given any instructions for her PLAVIX .Per Venida Jarvis , they do not have anything on file for the Plavix instructions. Barbera Setters mentions that she thinks they usually have patients stop Plavix for 5 days. RN informed Venida Jarvis that we need a COPY of those instructions. Sherri to contact Dr Ninfa Linden to make him aware. RN  Reminded Venida Jarvis that patient surgery is this coming Friday 10-06-2018. Sherri verbalized understanding. RN asked Sherri to call RN back after she speaks with Dr Ninfa Linden.

## 2018-10-05 ENCOUNTER — Other Ambulatory Visit: Payer: Self-pay | Admitting: Orthopaedic Surgery

## 2018-10-05 NOTE — Progress Notes (Signed)
Called patient to instruct her to call her Physician that prescribed the Plavix for instructions for stopping it. Patient informed nurse that Venida Jarvis , scheduler from Dr. Jake Church office called her yesterday and informed the patient that her surgery was cancelled for Friday 10/06/2018 because of not stopping Plavix soon enough. Instructed patient to call the physician that prescribed her the Plavix to get instructions for stopping the Plavix. Patient verbalized understanding. Called Sherri , scheduler for Dr. Zollie Beckers and LM on VM for her to call me back.

## 2018-10-09 NOTE — Progress Notes (Signed)
SPOKE WITH PATIENT : INFORMED HER OF NEW DATE OF SURGERY (SHE WAS ALREADY AWARE). RN WENT OVER THE NEW INSTRUCTIONS BELOW. PATIENT VERBALIZED UNDERSTANDING!               [name]  10/09/2018   Your procedure is scheduled on: 10-12-18  Report to [facility] Main  Entrance  Report to admitting at 8:00AM    Call this number if you have problems the morning of surgery [phone]   Remember: Do not eat food or drink liquids :After Midnight. BRUSH YOUR TEETH MORNING OF SURGERY AND RINSE YOUR MOUTH OUT, NO CHEWING GUM CANDY OR MINTS.     Take these medicines the morning of surgery with A SIP OF WATER: CARVEDILOL, GABAPENTIN, ISOSORBIDE, PANTOPRAZOLE, PREDNISONE, TYLENOL IF NEEDED   DO NOT TAKE ANY DIABETIC MEDICATIONS DAY OF YOUR SURGERY!!!                               You may not have any metal on your body including hair pins and              piercings  Do not wear jewelry, make-up, lotions, powders or perfumes, deodorant             Do not wear nail polish.  Do not shave  48 hours prior to surgery.              Men may shave face and neck.   Do not bring valuables to the hospital. [place].  Contacts, dentures or bridgework may not be worn into surgery.  Leave suitcase in the car. After surgery it may be brought to your room.                Please read over the following fact sheets you were given: _____________________________________________________________________             [facility] - Preparing for Surgery Before surgery, you can play an important role.  Because skin is not sterile, your skin needs to be as free of germs as possible.  You can reduce the number of germs on your skin by washing with CHG (chlorahexidine gluconate) soap before surgery.  CHG is an antiseptic cleaner which kills germs and bonds with the skin to continue killing germs even after washing. Please DO NOT use if you have an allergy to  CHG or antibacterial soaps.  If your skin becomes reddened/irritated stop using the CHG and inform your nurse when you arrive at Short Stay. Do not shave (including legs and underarms) for at least 48 hours prior to the first CHG shower.  You may shave your face/neck. Please follow these instructions carefully:  1.  Shower with CHG Soap the night before surgery and the  morning of Surgery.  2.  If you choose to wash your hair, wash your hair first as usual with your  normal  shampoo.  3.  After you shampoo, rinse your hair and body thoroughly to remove the  shampoo.                           4.  Use CHG as you would any other liquid soap.  You can apply chg directly  to the skin and wash  Gently with a scrungie or clean washcloth.  5.  Apply the CHG Soap to your body ONLY FROM THE NECK DOWN.   Do not use on face/ open                           Wound or open sores. Avoid contact with eyes, ears mouth and genitals (private parts).                       Wash face,  Genitals (private parts) with your normal soap.             6.  Wash thoroughly, paying special attention to the area where your surgery  will be performed.  7.  Thoroughly rinse your body with warm water from the neck down.  8.  DO NOT shower/wash with your normal soap after using and rinsing off  the CHG Soap.                9.  Pat yourself dry with a clean towel.            10.  Wear clean pajamas.            11.  Place clean sheets on your bed the night of your first shower and do not  sleep with pets. Day of Surgery : Do not apply any lotions/deodorants the morning of surgery.  Please wear clean clothes to the hospital/surgery center.  FAILURE TO FOLLOW THESE INSTRUCTIONS MAY RESULT IN THE CANCELLATION OF YOUR SURGERY PATIENT SIGNATURE_________________________________  NURSE SIGNATURE__________________________________  ________________________________________________________________________   Adam Phenix  An incentive spirometer is a tool that can help keep your lungs clear and active. This tool measures how well you are filling your lungs with each breath. Taking long deep breaths may help reverse or decrease the chance of developing breathing (pulmonary) problems (especially infection) following:  A long period of time when you are unable to move or be active. BEFORE THE PROCEDURE   If the spirometer includes an indicator to show your best effort, your nurse or respiratory therapist will set it to a desired goal.  If possible, sit up straight or lean slightly forward. Try not to slouch.  Hold the incentive spirometer in an upright position. INSTRUCTIONS FOR USE  1. Sit on the edge of your bed if possible, or sit up as far as you can in bed or on a chair. 2. Hold the incentive spirometer in an upright position. 3. Breathe out normally. 4. Place the mouthpiece in your mouth and seal your lips tightly around it. 5. Breathe in slowly and as deeply as possible, raising the piston or the ball toward the top of the column. 6. Hold your breath for 3-5 seconds or for as long as possible. Allow the piston or ball to fall to the bottom of the column. 7. Remove the mouthpiece from your mouth and breathe out normally. 8. Rest for a few seconds and repeat Steps 1 through 7 at least 10 times every 1-2 hours when you are awake. Take your time and take a few normal breaths between deep breaths. 9. The spirometer may include an indicator to show your best effort. Use the indicator as a goal to work toward during each repetition. 10. After each set of 10 deep breaths, practice coughing to be sure your lungs are clear. If you have an incision (the cut made at the time of surgery),  support your incision when coughing by placing a pillow or rolled up towels firmly against it. Once you are able to get out of bed, walk around indoors and cough well. You may stop using the incentive spirometer when  instructed by your caregiver.  RISKS AND COMPLICATIONS  Take your time so you do not get dizzy or light-headed.  If you are in pain, you may need to take or ask for pain medication before doing incentive spirometry. It is harder to take a deep breath if you are having pain. AFTER USE  Rest and breathe slowly and easily.  It can be helpful to keep track of a log of your progress. Your caregiver can provide you with a simple table to help with this. If you are using the spirometer at home, follow these instructions: [place] IF:   You are having difficultly using the spirometer.  You have trouble using the spirometer as often as instructed.  Your pain medication is not giving enough relief while using the spirometer.  You develop fever of 100.5 F (38.1 C) or higher. SEEK IMMEDIATE MEDICAL CARE IF:   You cough up bloody sputum that had not been present before.  You develop fever of 102 F (38.9 C) or greater.  You develop worsening pain at or near the incision site. MAKE SURE YOU:   Understand these instructions.  Will watch your condition.  Will get help right away if you are not doing well or get worse. Document Released: 04/11/2007 Document Revised: 02/21/2012 Document Reviewed: 06/12/2007 [facility] Patient Information 2014 [place], [place].   ________________________________________________________________________

## 2018-10-12 ENCOUNTER — Inpatient Hospital Stay (HOSPITAL_COMMUNITY): Payer: Medicare Other

## 2018-10-12 ENCOUNTER — Inpatient Hospital Stay (HOSPITAL_COMMUNITY): Payer: Medicare Other | Admitting: Anesthesiology

## 2018-10-12 ENCOUNTER — Inpatient Hospital Stay (HOSPITAL_COMMUNITY)
Admission: RE | Admit: 2018-10-12 | Discharge: 2018-10-16 | DRG: 470 | Disposition: A | Discharge status: Home / Self Care | Payer: Medicare Other | Attending: Orthopaedic Surgery | Admitting: Orthopaedic Surgery

## 2018-10-12 ENCOUNTER — Encounter (HOSPITAL_COMMUNITY): Payer: Self-pay | Admitting: Emergency Medicine

## 2018-10-12 ENCOUNTER — Encounter (HOSPITAL_COMMUNITY)
Admission: RE | Disposition: A | Discharge status: Home / Self Care | Payer: Self-pay | Source: Home / Self Care | Attending: Orthopaedic Surgery

## 2018-10-12 DIAGNOSIS — K602 Anal fissure, unspecified: Secondary | ICD-10-CM | POA: Diagnosis not present

## 2018-10-12 DIAGNOSIS — K219 Gastro-esophageal reflux disease without esophagitis: Secondary | ICD-10-CM | POA: Diagnosis not present

## 2018-10-12 DIAGNOSIS — Z951 Presence of aortocoronary bypass graft: Secondary | ICD-10-CM

## 2018-10-12 DIAGNOSIS — E1151 Type 2 diabetes mellitus with diabetic peripheral angiopathy without gangrene: Secondary | ICD-10-CM | POA: Diagnosis not present

## 2018-10-12 DIAGNOSIS — I5032 Chronic diastolic (congestive) heart failure: Secondary | ICD-10-CM | POA: Diagnosis present

## 2018-10-12 DIAGNOSIS — I25119 Atherosclerotic heart disease of native coronary artery with unspecified angina pectoris: Secondary | ICD-10-CM | POA: Diagnosis not present

## 2018-10-12 DIAGNOSIS — I2510 Atherosclerotic heart disease of native coronary artery without angina pectoris: Secondary | ICD-10-CM | POA: Diagnosis present

## 2018-10-12 DIAGNOSIS — Z9861 Coronary angioplasty status: Secondary | ICD-10-CM | POA: Diagnosis not present

## 2018-10-12 DIAGNOSIS — M1611 Unilateral primary osteoarthritis, right hip: Principal | ICD-10-CM | POA: Diagnosis present

## 2018-10-12 DIAGNOSIS — D62 Acute posthemorrhagic anemia: Secondary | ICD-10-CM | POA: Diagnosis not present

## 2018-10-12 DIAGNOSIS — Z8719 Personal history of other diseases of the digestive system: Secondary | ICD-10-CM | POA: Diagnosis not present

## 2018-10-12 DIAGNOSIS — Z888 Allergy status to other drugs, medicaments and biological substances status: Secondary | ICD-10-CM

## 2018-10-12 DIAGNOSIS — M2540 Effusion, unspecified joint: Secondary | ICD-10-CM | POA: Diagnosis not present

## 2018-10-12 DIAGNOSIS — Z91041 Radiographic dye allergy status: Secondary | ICD-10-CM | POA: Diagnosis not present

## 2018-10-12 DIAGNOSIS — E785 Hyperlipidemia, unspecified: Secondary | ICD-10-CM | POA: Diagnosis not present

## 2018-10-12 DIAGNOSIS — D638 Anemia in other chronic diseases classified elsewhere: Secondary | ICD-10-CM | POA: Diagnosis present

## 2018-10-12 DIAGNOSIS — Z8673 Personal history of transient ischemic attack (TIA), and cerebral infarction without residual deficits: Secondary | ICD-10-CM

## 2018-10-12 DIAGNOSIS — M1990 Unspecified osteoarthritis, unspecified site: Secondary | ICD-10-CM | POA: Diagnosis not present

## 2018-10-12 DIAGNOSIS — Z8249 Family history of ischemic heart disease and other diseases of the circulatory system: Secondary | ICD-10-CM | POA: Diagnosis not present

## 2018-10-12 DIAGNOSIS — I739 Peripheral vascular disease, unspecified: Secondary | ICD-10-CM | POA: Diagnosis not present

## 2018-10-12 DIAGNOSIS — I252 Old myocardial infarction: Secondary | ICD-10-CM | POA: Diagnosis not present

## 2018-10-12 DIAGNOSIS — Z471 Aftercare following joint replacement surgery: Secondary | ICD-10-CM | POA: Diagnosis not present

## 2018-10-12 DIAGNOSIS — Z91013 Allergy to seafood: Secondary | ICD-10-CM | POA: Diagnosis not present

## 2018-10-12 DIAGNOSIS — I110 Hypertensive heart disease with heart failure: Secondary | ICD-10-CM | POA: Diagnosis not present

## 2018-10-12 DIAGNOSIS — Z7401 Bed confinement status: Secondary | ICD-10-CM | POA: Diagnosis not present

## 2018-10-12 DIAGNOSIS — Z86010 Personal history of colonic polyps: Secondary | ICD-10-CM | POA: Diagnosis not present

## 2018-10-12 DIAGNOSIS — K2270 Barrett's esophagus without dysplasia: Secondary | ICD-10-CM | POA: Diagnosis not present

## 2018-10-12 DIAGNOSIS — M2550 Pain in unspecified joint: Secondary | ICD-10-CM | POA: Diagnosis not present

## 2018-10-12 DIAGNOSIS — K5730 Diverticulosis of large intestine without perforation or abscess without bleeding: Secondary | ICD-10-CM | POA: Diagnosis not present

## 2018-10-12 DIAGNOSIS — M25551 Pain in right hip: Secondary | ICD-10-CM | POA: Diagnosis not present

## 2018-10-12 DIAGNOSIS — Z96641 Presence of right artificial hip joint: Secondary | ICD-10-CM

## 2018-10-12 DIAGNOSIS — Z419 Encounter for procedure for purposes other than remedying health state, unspecified: Secondary | ICD-10-CM

## 2018-10-12 DIAGNOSIS — Z227 Latent tuberculosis: Secondary | ICD-10-CM | POA: Diagnosis not present

## 2018-10-12 HISTORY — PX: TOTAL HIP ARTHROPLASTY: SHX124

## 2018-10-12 HISTORY — DX: Presence of right artificial hip joint: Z96.641

## 2018-10-12 LAB — CBC WITH DIFFERENTIAL/PLATELET
ABS IMMATURE GRANULOCYTES: 0.01 10*3/uL (ref 0.00–0.07)
BASOS ABS: 0 10*3/uL (ref 0.0–0.1)
BASOS PCT: 1 %
EOS ABS: 0.1 10*3/uL (ref 0.0–0.5)
Eosinophils Relative: 2 %
HEMATOCRIT: 36.9 % (ref 36.0–46.0)
Hemoglobin: 11.2 g/dL — ABNORMAL LOW (ref 12.0–15.0)
IMMATURE GRANULOCYTES: 0 %
Lymphocytes Relative: 23 %
Lymphs Abs: 1.1 10*3/uL (ref 0.7–4.0)
MCH: 27.9 pg (ref 26.0–34.0)
MCHC: 30.4 g/dL (ref 30.0–36.0)
MCV: 91.8 fL (ref 80.0–100.0)
MONOS PCT: 11 %
Monocytes Absolute: 0.5 10*3/uL (ref 0.1–1.0)
NEUTROS ABS: 2.9 10*3/uL (ref 1.7–7.7)
NEUTROS PCT: 63 %
NRBC: 0 % (ref 0.0–0.2)
Platelets: 245 10*3/uL (ref 150–400)
RBC: 4.02 MIL/uL (ref 3.87–5.11)
RDW: 13.7 % (ref 11.5–15.5)
WBC: 4.7 10*3/uL (ref 4.0–10.5)

## 2018-10-12 LAB — COMPREHENSIVE METABOLIC PANEL
ALBUMIN: 4.2 g/dL (ref 3.5–5.0)
ALK PHOS: 36 U/L — AB (ref 38–126)
ALT: 17 U/L (ref 0–44)
ANION GAP: 9 (ref 5–15)
AST: 26 U/L (ref 15–41)
BILIRUBIN TOTAL: 1 mg/dL (ref 0.3–1.2)
BUN: 18 mg/dL (ref 8–23)
CALCIUM: 9 mg/dL (ref 8.9–10.3)
CO2: 29 mmol/L (ref 22–32)
Chloride: 102 mmol/L (ref 98–111)
Creatinine, Ser: 1.03 mg/dL — ABNORMAL HIGH (ref 0.44–1.00)
GFR calc non Af Amer: 49 mL/min — ABNORMAL LOW (ref >60)
GFR, EST AFRICAN AMERICAN: 57 mL/min — AB (ref >60)
GLUCOSE: 129 mg/dL — AB (ref 70–99)
Potassium: 3.3 mmol/L — ABNORMAL LOW (ref 3.5–5.1)
Sodium: 140 mmol/L (ref 135–145)
TOTAL PROTEIN: 7.4 g/dL (ref 6.5–8.1)

## 2018-10-12 LAB — APTT: APTT: 27 s (ref 24–36)

## 2018-10-12 LAB — GLUCOSE, CAPILLARY
GLUCOSE-CAPILLARY: 132 mg/dL — AB (ref 70–99)
Glucose-Capillary: 128 mg/dL — ABNORMAL HIGH (ref 70–99)
Glucose-Capillary: 122 mg/dL — ABNORMAL HIGH (ref 70–99)

## 2018-10-12 LAB — PROTIME-INR
INR: 0.98
Prothrombin Time: 12.9 seconds (ref 11.4–15.2)

## 2018-10-12 SURGERY — ARTHROPLASTY, HIP, TOTAL, ANTERIOR APPROACH
Anesthesia: Spinal | Site: Hip | Laterality: Right

## 2018-10-12 MED ORDER — CARVEDILOL 3.125 MG PO TABS
3.1250 mg | ORAL_TABLET | Freq: Two times a day (BID) | ORAL | Status: DC
  Administered 2018-10-13 – 2018-10-16 (×7): 3.125 mg via ORAL
  Filled 2018-10-12 (×8): qty 1

## 2018-10-12 MED ORDER — FUROSEMIDE 20 MG PO TABS
30.0000 mg | ORAL_TABLET | Freq: Every day | ORAL | Status: DC
  Administered 2018-10-13 – 2018-10-16 (×4): 30 mg via ORAL
  Filled 2018-10-12 (×4): qty 2

## 2018-10-12 MED ORDER — 0.9 % SODIUM CHLORIDE (POUR BTL) OPTIME
TOPICAL | Status: DC | PRN
  Administered 2018-10-12: 1000 mL

## 2018-10-12 MED ORDER — HYDROCODONE-ACETAMINOPHEN 7.5-325 MG PO TABS
1.0000 | ORAL_TABLET | ORAL | Status: DC | PRN
  Administered 2018-10-12: 1 via ORAL
  Administered 2018-10-13 – 2018-10-15 (×4): 2 via ORAL
  Filled 2018-10-12 (×3): qty 2
  Filled 2018-10-12: qty 1
  Filled 2018-10-12: qty 2

## 2018-10-12 MED ORDER — LIDOCAINE 2% (20 MG/ML) 5 ML SYRINGE
INTRAMUSCULAR | Status: AC
  Filled 2018-10-12: qty 5

## 2018-10-12 MED ORDER — METOCLOPRAMIDE HCL 5 MG PO TABS: 5.0000 mg | ORAL_TABLET | Freq: Three times a day (TID) | ORAL | Status: DC | PRN

## 2018-10-12 MED ORDER — MORPHINE SULFATE (PF) 2 MG/ML IV SOLN
0.5000 mg | INTRAVENOUS | Status: DC | PRN
  Administered 2018-10-12: 1 mg via INTRAVENOUS
  Filled 2018-10-12: qty 1

## 2018-10-12 MED ORDER — MENTHOL 3 MG MT LOZG: 1.0000 | LOZENGE | OROMUCOSAL | Status: DC | PRN

## 2018-10-12 MED ORDER — ROSUVASTATIN CALCIUM 20 MG PO TABS
20.0000 mg | ORAL_TABLET | Freq: Every day | ORAL | Status: DC
  Administered 2018-10-12 – 2018-10-15 (×4): 20 mg via ORAL
  Filled 2018-10-12 (×4): qty 1

## 2018-10-12 MED ORDER — PHENYLEPHRINE HCL-NACL 10-0.9 MG/250ML-% IV SOLN
INTRAVENOUS | Status: AC
  Filled 2018-10-12: qty 250

## 2018-10-12 MED ORDER — TRANEXAMIC ACID-NACL 1000-0.7 MG/100ML-% IV SOLN
1000.0000 mg | INTRAVENOUS | Status: AC
  Administered 2018-10-12: 1000 mg via INTRAVENOUS
  Filled 2018-10-12: qty 100

## 2018-10-12 MED ORDER — BUPIVACAINE IN DEXTROSE 0.75-8.25 % IT SOLN
INTRATHECAL | Status: DC | PRN
  Administered 2018-10-12: 1.6 mL via INTRATHECAL

## 2018-10-12 MED ORDER — FENTANYL CITRATE (PF) 100 MCG/2ML IJ SOLN
INTRAMUSCULAR | Status: DC | PRN
  Administered 2018-10-12 (×2): 50 ug via INTRAVENOUS

## 2018-10-12 MED ORDER — ONDANSETRON HCL 4 MG PO TABS: 4.0000 mg | ORAL_TABLET | Freq: Four times a day (QID) | ORAL | Status: DC | PRN

## 2018-10-12 MED ORDER — LIDOCAINE 2% (20 MG/ML) 5 ML SYRINGE
INTRAMUSCULAR | Status: DC | PRN
  Administered 2018-10-12: 50 mg via INTRAVENOUS

## 2018-10-12 MED ORDER — HYDROCODONE-ACETAMINOPHEN 5-325 MG PO TABS
1.0000 | ORAL_TABLET | ORAL | Status: DC | PRN
  Administered 2018-10-12: 2 via ORAL
  Administered 2018-10-13: 1 via ORAL
  Administered 2018-10-14 – 2018-10-16 (×5): 2 via ORAL
  Filled 2018-10-12 (×2): qty 2
  Filled 2018-10-12: qty 1
  Filled 2018-10-12 (×4): qty 2

## 2018-10-12 MED ORDER — POLYVINYL ALCOHOL 1.4 % OP SOLN
1.0000 [drp] | Freq: Every day | OPHTHALMIC | Status: DC
  Administered 2018-10-12 – 2018-10-15 (×4): 1 [drp] via OPHTHALMIC
  Filled 2018-10-12: qty 15

## 2018-10-12 MED ORDER — METHOCARBAMOL 500 MG IVPB - SIMPLE MED
500.0000 mg | Freq: Four times a day (QID) | INTRAVENOUS | Status: DC | PRN
  Filled 2018-10-12: qty 50

## 2018-10-12 MED ORDER — PROPOFOL 500 MG/50ML IV EMUL
INTRAVENOUS | Status: DC | PRN
  Administered 2018-10-12: 75 ug/kg/min via INTRAVENOUS

## 2018-10-12 MED ORDER — OXYCODONE HCL 5 MG PO TABS: 5.0000 mg | ORAL_TABLET | Freq: Once | ORAL | Status: DC | PRN

## 2018-10-12 MED ORDER — LORAZEPAM 0.5 MG PO TABS: 0.5000 mg | ORAL_TABLET | Freq: Four times a day (QID) | ORAL | Status: DC | PRN

## 2018-10-12 MED ORDER — ACETAMINOPHEN 325 MG PO TABS: 325.0000 mg | ORAL_TABLET | Freq: Four times a day (QID) | ORAL | Status: DC | PRN

## 2018-10-12 MED ORDER — POLYETHYLENE GLYCOL 3350 17 G PO PACK
17.0000 g | PACK | Freq: Every day | ORAL | Status: DC | PRN
  Administered 2018-10-13 – 2018-10-16 (×2): 17 g via ORAL
  Filled 2018-10-12 (×2): qty 1

## 2018-10-12 MED ORDER — DOCUSATE SODIUM 100 MG PO CAPS
100.0000 mg | ORAL_CAPSULE | Freq: Two times a day (BID) | ORAL | Status: DC
  Administered 2018-10-12 – 2018-10-16 (×8): 100 mg via ORAL
  Filled 2018-10-12 (×8): qty 1

## 2018-10-12 MED ORDER — ONDANSETRON HCL 4 MG/2ML IJ SOLN
INTRAMUSCULAR | Status: AC
  Filled 2018-10-12: qty 2

## 2018-10-12 MED ORDER — PROPOFOL 10 MG/ML IV BOLUS
INTRAVENOUS | Status: AC
  Filled 2018-10-12: qty 20

## 2018-10-12 MED ORDER — ISOSORBIDE MONONITRATE ER 60 MG PO TB24
60.0000 mg | ORAL_TABLET | Freq: Every day | ORAL | Status: DC
  Administered 2018-10-13 – 2018-10-16 (×4): 60 mg via ORAL
  Filled 2018-10-12 (×4): qty 1

## 2018-10-12 MED ORDER — GABAPENTIN 300 MG PO CAPS
300.0000 mg | ORAL_CAPSULE | Freq: Three times a day (TID) | ORAL | Status: DC
  Administered 2018-10-12 – 2018-10-16 (×12): 300 mg via ORAL
  Filled 2018-10-12 (×12): qty 1

## 2018-10-12 MED ORDER — FENTANYL CITRATE (PF) 100 MCG/2ML IJ SOLN
INTRAMUSCULAR | Status: AC
  Filled 2018-10-12: qty 2

## 2018-10-12 MED ORDER — SODIUM CHLORIDE 0.9 % IV SOLN
INTRAVENOUS | Status: DC | PRN
  Administered 2018-10-12: 25 ug/min via INTRAVENOUS

## 2018-10-12 MED ORDER — CHLORHEXIDINE GLUCONATE 4 % EX LIQD: 60.0000 mL | Freq: Once | CUTANEOUS | Status: DC

## 2018-10-12 MED ORDER — PROPOFOL 10 MG/ML IV BOLUS
INTRAVENOUS | Status: AC
  Filled 2018-10-12: qty 40

## 2018-10-12 MED ORDER — LACTATED RINGERS IV SOLN
INTRAVENOUS | Status: DC
  Administered 2018-10-12: 09:00:00 via INTRAVENOUS

## 2018-10-12 MED ORDER — GLIMEPIRIDE 2 MG PO TABS
2.0000 mg | ORAL_TABLET | Freq: Every day | ORAL | Status: DC
  Administered 2018-10-13 – 2018-10-16 (×4): 2 mg via ORAL
  Filled 2018-10-12 (×4): qty 1

## 2018-10-12 MED ORDER — DIPHENHYDRAMINE HCL 12.5 MG/5ML PO ELIX: 12.5000 mg | ORAL_SOLUTION | ORAL | Status: DC | PRN

## 2018-10-12 MED ORDER — ONDANSETRON HCL 4 MG/2ML IJ SOLN
4.0000 mg | Freq: Four times a day (QID) | INTRAMUSCULAR | Status: DC | PRN
  Administered 2018-10-12: 4 mg via INTRAVENOUS
  Filled 2018-10-12: qty 2

## 2018-10-12 MED ORDER — STERILE WATER FOR IRRIGATION IR SOLN
Status: DC | PRN
  Administered 2018-10-12: 1000 mL

## 2018-10-12 MED ORDER — CEFAZOLIN SODIUM-DEXTROSE 1-4 GM/50ML-% IV SOLN
1.0000 g | Freq: Four times a day (QID) | INTRAVENOUS | Status: AC
  Administered 2018-10-12 (×2): 1 g via INTRAVENOUS
  Filled 2018-10-12 (×2): qty 50

## 2018-10-12 MED ORDER — PANTOPRAZOLE SODIUM 40 MG PO TBEC
40.0000 mg | DELAYED_RELEASE_TABLET | Freq: Two times a day (BID) | ORAL | Status: DC
  Administered 2018-10-12 – 2018-10-16 (×8): 40 mg via ORAL
  Filled 2018-10-12 (×8): qty 1

## 2018-10-12 MED ORDER — CLOPIDOGREL BISULFATE 75 MG PO TABS
75.0000 mg | ORAL_TABLET | Freq: Every day | ORAL | Status: DC
  Administered 2018-10-12 – 2018-10-16 (×5): 75 mg via ORAL
  Filled 2018-10-12 (×5): qty 1

## 2018-10-12 MED ORDER — SODIUM CHLORIDE 0.9 % IV SOLN
INTRAVENOUS | Status: DC
  Administered 2018-10-12 – 2018-10-13 (×2): via INTRAVENOUS

## 2018-10-12 MED ORDER — FOLIC ACID 1 MG PO TABS
1.0000 mg | ORAL_TABLET | Freq: Every day | ORAL | Status: DC
  Administered 2018-10-12 – 2018-10-16 (×5): 1 mg via ORAL
  Filled 2018-10-12 (×5): qty 1

## 2018-10-12 MED ORDER — METOCLOPRAMIDE HCL 5 MG/ML IJ SOLN
5.0000 mg | Freq: Three times a day (TID) | INTRAMUSCULAR | Status: DC | PRN
  Administered 2018-10-12: 10 mg via INTRAVENOUS
  Filled 2018-10-12: qty 2

## 2018-10-12 MED ORDER — PROMETHAZINE HCL 25 MG/ML IJ SOLN
INTRAMUSCULAR | Status: AC
  Filled 2018-10-12: qty 1

## 2018-10-12 MED ORDER — EZETIMIBE 10 MG PO TABS
10.0000 mg | ORAL_TABLET | Freq: Every day | ORAL | Status: DC
  Administered 2018-10-12 – 2018-10-15 (×4): 10 mg via ORAL
  Filled 2018-10-12 (×4): qty 1

## 2018-10-12 MED ORDER — PROMETHAZINE HCL 25 MG/ML IJ SOLN
6.2500 mg | INTRAMUSCULAR | Status: DC | PRN
  Administered 2018-10-12: 6.25 mg via INTRAVENOUS

## 2018-10-12 MED ORDER — PREDNISONE 5 MG PO TABS
5.0000 mg | ORAL_TABLET | Freq: Every day | ORAL | Status: DC
  Administered 2018-10-13 – 2018-10-16 (×4): 5 mg via ORAL
  Filled 2018-10-12 (×4): qty 1

## 2018-10-12 MED ORDER — ONDANSETRON HCL 4 MG/2ML IJ SOLN
INTRAMUSCULAR | Status: DC | PRN
  Administered 2018-10-12: 4 mg via INTRAVENOUS

## 2018-10-12 MED ORDER — PHENOL 1.4 % MT LIQD: 1.0000 | OROMUCOSAL | Status: DC | PRN

## 2018-10-12 MED ORDER — CEFAZOLIN SODIUM-DEXTROSE 2-4 GM/100ML-% IV SOLN
2.0000 g | INTRAVENOUS | Status: AC
  Administered 2018-10-12: 2 g via INTRAVENOUS
  Filled 2018-10-12: qty 100

## 2018-10-12 MED ORDER — SODIUM CHLORIDE 0.9 % IR SOLN
Status: DC | PRN
  Administered 2018-10-12: 1000 mL

## 2018-10-12 MED ORDER — OXYCODONE HCL 5 MG/5ML PO SOLN
5.0000 mg | Freq: Once | ORAL | Status: DC | PRN
  Filled 2018-10-12: qty 5

## 2018-10-12 MED ORDER — HYDROMORPHONE HCL 1 MG/ML IJ SOLN: 0.2500 mg | INTRAMUSCULAR | Status: DC | PRN

## 2018-10-12 MED ORDER — ALUM & MAG HYDROXIDE-SIMETH 200-200-20 MG/5ML PO SUSP
30.0000 mL | ORAL | Status: DC | PRN
  Administered 2018-10-13 – 2018-10-15 (×5): 30 mL via ORAL
  Filled 2018-10-12 (×5): qty 30

## 2018-10-12 MED ORDER — TRAMADOL HCL 50 MG PO TABS
50.0000 mg | ORAL_TABLET | Freq: Four times a day (QID) | ORAL | Status: DC | PRN
  Administered 2018-10-14: 100 mg via ORAL
  Filled 2018-10-12: qty 2

## 2018-10-12 MED ORDER — METHOCARBAMOL 500 MG PO TABS
500.0000 mg | ORAL_TABLET | Freq: Four times a day (QID) | ORAL | Status: DC | PRN
  Administered 2018-10-12 – 2018-10-13 (×3): 500 mg via ORAL
  Filled 2018-10-12 (×3): qty 1

## 2018-10-12 SURGICAL SUPPLY — 41 items
APL SKNCLS STERI-STRIP NONHPOA (GAUZE/BANDAGES/DRESSINGS)
BAG SPEC THK2 15X12 ZIP CLS (MISCELLANEOUS)
BAG ZIPLOCK 12X15 (MISCELLANEOUS) IMPLANT
BENZOIN TINCTURE PRP APPL 2/3 (GAUZE/BANDAGES/DRESSINGS) IMPLANT
BLADE SAW SGTL 18X1.27X75 (BLADE) ×2 IMPLANT
CABLE CERLAGE W/CRIMP 1.8 (Cable) ×1 IMPLANT
CHLORAPREP W/TINT 26ML (MISCELLANEOUS) ×1 IMPLANT
COVER PERINEAL POST (MISCELLANEOUS) ×2 IMPLANT
COVER SURGICAL LIGHT HANDLE (MISCELLANEOUS) ×2 IMPLANT
COVER WAND RF STERILE (DRAPES) ×1 IMPLANT
CUP SECTOR GRIPTON 50MM (Cup) ×1 IMPLANT
DRAPE SHEET LG 3/4 BI-LAMINATE (DRAPES) ×1 IMPLANT
DRAPE U-SHAPE 47X51 STRL (DRAPES) ×4 IMPLANT
DRSG AQUACEL AG ADV 3.5X10 (GAUZE/BANDAGES/DRESSINGS) ×2 IMPLANT
ELECT REM PT RETURN 15FT ADLT (MISCELLANEOUS) ×2 IMPLANT
GAUZE XEROFORM 1X8 LF (GAUZE/BANDAGES/DRESSINGS) ×1 IMPLANT
GLOVE BIOGEL PI IND STRL 7.5 (GLOVE) ×2 IMPLANT
GLOVE BIOGEL PI IND STRL 8 (GLOVE) ×2 IMPLANT
GLOVE BIOGEL PI INDICATOR 7.5 (GLOVE) ×2
GLOVE BIOGEL PI INDICATOR 8 (GLOVE) ×2
GLOVE SURG SS PI 7.5 STRL IVOR (GLOVE) ×4 IMPLANT
GLOVE SURG SS PI 8.0 STRL IVOR (GLOVE) ×2 IMPLANT
GOWN STRL REUS W/TWL XL LVL3 (GOWN DISPOSABLE) ×6 IMPLANT
HANDPIECE INTERPULSE COAX TIP (DISPOSABLE) ×2
HEAD FEM STD 32X+1 STRL (Hips) ×2 IMPLANT
HOLDER FOLEY CATH W/STRAP (MISCELLANEOUS) ×2 IMPLANT
LINER ACETABULAR 32X50 (Liner) ×2 IMPLANT
PACK ANTERIOR HIP CUSTOM (KITS) ×2 IMPLANT
SCREW 6.5MMX25MM (Screw) ×1 IMPLANT
SET HNDPC FAN SPRY TIP SCT (DISPOSABLE) ×1 IMPLANT
STAPLER VISISTAT 35W (STAPLE) ×1 IMPLANT
STEM CORAIL KA10 (Stem) ×1 IMPLANT
STRIP CLOSURE SKIN 1/2X4 (GAUZE/BANDAGES/DRESSINGS) IMPLANT
SUT ETHIBOND NAB CT1 #1 30IN (SUTURE) ×2 IMPLANT
SUT MNCRL AB 4-0 PS2 18 (SUTURE) IMPLANT
SUT VIC AB 0 CT1 36 (SUTURE) ×2 IMPLANT
SUT VIC AB 1 CT1 36 (SUTURE) ×2 IMPLANT
SUT VIC AB 2-0 CT1 27 (SUTURE) ×4
SUT VIC AB 2-0 CT1 TAPERPNT 27 (SUTURE) ×2 IMPLANT
TRAY FOLEY SLVR 16FR LF STAT (SET/KITS/TRAYS/PACK) ×1 IMPLANT
YANKAUER SUCT BULB TIP 10FT TU (MISCELLANEOUS) ×2 IMPLANT

## 2018-10-12 NOTE — Anesthesia Preprocedure Evaluation (Signed)
Anesthesia Evaluation  Patient identified by MRN, date of birth, ID band Patient awake    Reviewed: Allergy & Precautions, NPO status , Patient's Chart, lab work & pertinent test results, reviewed documented beta blocker date and time   Airway Mallampati: II  TM Distance: >3 FB Neck ROM: Full    Dental no notable dental hx.    Pulmonary neg pulmonary ROS,    Pulmonary exam normal breath sounds clear to auscultation       Cardiovascular hypertension, Pt. on medications and Pt. on home beta blockers + angina with exertion + CAD, + Past MI, + Peripheral Vascular Disease, +CHF and + DOE  Normal cardiovascular exam Rhythm:Regular Rate:Normal     Neuro/Psych CVA, No Residual Symptoms negative psych ROS   GI/Hepatic Neg liver ROS, GERD  ,  Endo/Other  negative endocrine ROSdiabetes, Type 2  Renal/GU negative Renal ROS  negative genitourinary   Musculoskeletal  (+) Arthritis , Osteoarthritis,    Abdominal   Peds negative pediatric ROS (+)  Hematology negative hematology ROS (+)   Anesthesia Other Findings   Reproductive/Obstetrics negative OB ROS                             Anesthesia Physical Anesthesia Plan  ASA: III  Anesthesia Plan: Spinal   Post-op Pain Management:    Induction: Intravenous  PONV Risk Score and Plan: 2 and Ondansetron, Midazolam and Treatment may vary due to age or medical condition  Airway Management Planned: Simple Face Mask  Additional Equipment:   Intra-op Plan:   Post-operative Plan:   Informed Consent: I have reviewed the patients History and Physical, chart, labs and discussed the procedure including the risks, benefits and alternatives for the proposed anesthesia with the patient or authorized representative who has indicated his/her understanding and acceptance.   Dental advisory given  Plan Discussed with: CRNA  Anesthesia Plan Comments:          Anesthesia Quick Evaluation

## 2018-10-12 NOTE — Anesthesia Postprocedure Evaluation (Signed)
Anesthesia Post Note  Patient: [name]  Procedure(s) Performed: RIGHT TOTAL HIP ARTHROPLASTY ANTERIOR APPROACH (Right Hip)     Patient location during evaluation: PACU Anesthesia Type: Spinal Level of consciousness: oriented and awake and alert Pain management: pain level controlled Vital Signs Assessment: post-procedure vital signs reviewed and stable Respiratory status: spontaneous breathing and respiratory function stable Cardiovascular status: blood pressure returned to baseline and stable Postop Assessment: no headache, no backache and no apparent nausea or vomiting Anesthetic complications: no    Last Vitals:  Vitals:   10/12/18 1515 10/12/18 1530  BP: 125/89 118/74  Pulse: (!) 51 (!) 51  Resp: 15 17  Temp:    SpO2: 100% 100%    Last Pain:  Vitals:   10/12/18 1530  TempSrc:   PainSc: Asleep                 Lynda Rainwater

## 2018-10-12 NOTE — Brief Op Note (Signed)
10/12/2018  1:51 PM  PATIENT:  [name]  84 y.o. female  PRE-OPERATIVE DIAGNOSIS:  osteoarthritis right hip  POST-OPERATIVE DIAGNOSIS:  osteoarthritis right hip  PROCEDURE:  Procedure(s): RIGHT TOTAL HIP ARTHROPLASTY ANTERIOR APPROACH (Right)  SURGEON:  Surgeon(s) and Role:    Mcarthur Rossetti, MD - Primary  PHYSICIAN ASSISTANT: Benita Stabile, PA-C  ANESTHESIA:   spinal  EBL:  350 mL   COUNTS:  YES  TOURNIQUET:  * No tourniquets in log *  DICTATION: .Other Dictation: Dictation Number [id number]  PLAN OF CARE: Admit to inpatient   PATIENT DISPOSITION:  PACU - hemodynamically stable.   Delay start of Pharmacological VTE agent (>24hrs) due to surgical blood loss or risk of bleeding: no

## 2018-10-12 NOTE — Progress Notes (Signed)
Dr. [name] Heck informed that pt had chest tightness last Wednesday when getting her husband ready for respite care.  Pt took 2 nitroglycerin tablets with relief, has not had any chest pain since.  Dr. [name] Heck gave verbal order to obtain new EKG prior to surgery,.

## 2018-10-12 NOTE — Plan of Care (Signed)

## 2018-10-12 NOTE — Progress Notes (Addendum)
EKG given to DR. Miller. No new orders

## 2018-10-12 NOTE — Op Note (Signed)
NAME: [name], [name] MEDICAL RECORD [id number] ACCOUNT [id number] DATE OF BIRTH:January 22, 1933 FACILITY: [facility] LOCATION: [facility] PHYSICIAN:Srinika Delone Kerry Fort, MD  OPERATIVE REPORT  DATE OF PROCEDURE:  10/12/2018  PREOPERATIVE DIAGNOSIS:  Severe osteoarthritis and degenerative joint disease, right hip.  POSTOPERATIVE DIAGNOSIS:  Severe osteoarthritis and degenerative joint disease, right hip.  PROCEDURE:  Right total hip arthroplasty, direct anterior approach.  IMPLANTS:  DePuy Sector Gription acetabular component size 50, size 32+0 neutral polyethylene liner, size 10 Corail femoral component with standard offset, size 32+1 metal hip ball and 1 cable around the calcar region of the proximal femur.  SURGEON:  Lind Guest. Ninfa Linden, MD  ANESTHESIA:  Spinal.  ANTIBIOTICS:  Two grams IV Ancef.  ESTIMATED BLOOD LOSS:  400 mL.  COMPLICATIONS:  Calcar fracture requiring one cable.  INDICATIONS:  The patient is an 84 year old frail female with debilitating arthritis of both her hips.  At this point, she has tried and failed all forms of conservative treatment.  She is a healthy individual with some osteopenic bone.  We have  recommended total hip arthroplasty and she does wish to have this done with very conservative treatment.  Her pain has been daily and it has detrimentally affected activities of daily living, her quality of life and her mobility.  Her family wants to  have this done as well.  They all, including her, take care of her husband who has a slight touch of dementia.  We had a long and thorough discussion about the risk of acute blood loss anemia, nerve or vessel injury, fracture, infection, dislocation,  implant failure.  She understands our goals are to decrease pain, improve mobility and overall improve quality of life.  DESCRIPTION OF PROCEDURE:  After informed consent was obtained and appropriate right hip was marked, she was brought to the operating room and spinal  anesthesia was obtained while she was on her stretcher.  We laid her in supine position.  Foley catheter  was placed and traction boots were placed on both her feet.  Next, she was placed supine on the Hana fracture table, the perineal post in place and both legs in line skeletal traction device and no traction applied.  Her right operative hip was prepped  and draped with DuraPrep and sterile drapes.  A time-out was called to identify correct patient, correct right hip.  I then made an incision just inferior and posterior to the anterior superior iliac spine and carried this obliquely down the leg.  We  dissected down tensor fascia lata muscle.  Tensor fascia was then divided longitudinally to proceed with direct anterior approach to the hip.  We identified and cauterized circumflex vessels.  I then identified the hip capsule, opened the hip capsule in  an L-type format, finding moderate joint effusion and significant periarticular osteophytes around her right hip.  We then placed a Cobra retractor on the medial and lateral femoral neck and made our femoral neck cut with an oscillating saw just proximal  to the lesser trochanter and completed this with an osteotome.  We placed a corkscrew guide in the femoral head and removed the femoral head in its entirety and found it to be devoid of cartilage.  We then placed a bent Hohmann over the medial  acetabular rim and removed remnants of the acetabular labrum and other debris from the hip.  We then began reaming from a size 44 reamer in stepwise increments up to a size 49 with all reamers under direct visualization, the last reamer  under direct  fluoroscopy, so we could see our depth of reaming, our inclination and anteversion.  At that point, I was pleased with my cup position.  Next, we placed the real DePuy Sector Gription acetabular component size 50 and a single screw and a 32+0 neutral  polyethylene liner for that size 50 acetabular component.  We then  brought the leg down and under and with a Mueller retractor medially and Hohman retractor behind the greater trochanter we used a box box-cutting osteotome to enter femoral canal.  We  used a rongeur to lateralize as well.  We then began broaching from size 8 broach, going up to a size 10.  With a 10 in place, we trialed a standard offset neck and 32+1 hip brought the leg back over and up with traction tried reducing the pelvis, but  the femoral neck was caught on the edge of the pelvis and acetabulum.  This did cause a crack in the calcar.  We recognized that early so we were able to remove the broach stem and place a cable around the calcar.  I then actually removed the acetabular  stem and component because looking at the acetabular component and critiquing it under direct fluoroscopy, I felt like we could medialize her a little bit more and make the cup just a slight bit more vertical.  We were able to do that, so we removed the  polyethylene liner that we had placed a single screw and we reamed up to a size 50 and was able to medialize more and put the same size 50 acetabular component back in place.  This time I did not secure with any screw, but it felt stable.  I placed her  new 32+0 polyethylene liner and then reduced the hip easy.  We then dislocated the hip and removed the trial components.  We placed the real Corail femoral component size 10 with standard offset and the real 32+1 metal hip ball again reduced in the  acetabulum and we felt really good about stability, range of motion offset and leg length.  We then irrigated the soft tissue with normal saline solution using pulsatile lavage.  We closed the joint capsule with interrupted #1 Ethibond suture, followed  by running 0 Vicryl and tensor fascia, 0 Vicryl in the deep tissue, 2-0 Vicryl subcutaneous tissue and interrupted staples on the skin.  Xeroform well-padded sterile dressing was applied.  She was taken off the Hana table and taken to  recovery room in  stable condition.  All final counts were correct.  Of note, Benita Stabile, PA-C, assisted in the entire case.  Assistance was crucial for facilitating all aspects of this case.  TN/NUANCE  D:10/12/2018 T:10/12/2018 JOB:[id number]

## 2018-10-12 NOTE — Transfer of Care (Signed)
Immediate Anesthesia Transfer of Care Note  Patient: [name]  Procedure(s) Performed: RIGHT TOTAL HIP ARTHROPLASTY ANTERIOR APPROACH (Right Hip)  Patient Location: PACU  Anesthesia Type:Spinal  Level of Consciousness: awake, alert  and oriented  Airway & Oxygen Therapy: Patient Spontanous Breathing and Patient connected to face mask oxygen  Post-op Assessment: Report given to RN and Post -op Vital signs reviewed and stable  Post vital signs: Reviewed and stable  Last Vitals:  Vitals Value Taken Time  BP 119/52 10/12/2018  2:08 PM  Temp    Pulse 60 10/12/2018  2:09 PM  Resp 17 10/12/2018  2:09 PM  SpO2 100 % 10/12/2018  2:09 PM  Vitals shown include unvalidated device data.  Last Pain:  Vitals:   10/12/18 0844  TempSrc:   PainSc: 5       Patients Stated Pain Goal: 4 (67/12/45 8099)  Complications: No apparent anesthesia complications

## 2018-10-12 NOTE — Anesthesia Procedure Notes (Signed)
Spinal  Patient location during procedure: OR Start time: 10/12/2018 11:14 AM End time: 10/12/2018 11:19 AM Staffing Anesthesiologist: Lynda Rainwater, MD Performed: anesthesiologist  Preanesthetic Checklist Completed: patient identified, site marked, surgical consent, pre-op evaluation, timeout performed, IV checked, risks and benefits discussed and monitors and equipment checked Spinal Block Patient position: sitting Prep: ChloraPrep Patient monitoring: heart rate, continuous pulse ox and blood pressure Approach: midline Location: L3-4 Injection technique: single-shot Needle Needle type: Quincke  Needle gauge: 22 G Needle length: 9 cm

## 2018-10-12 NOTE — H&P (Signed)
TOTAL HIP ADMISSION H&P  Patient is admitted for right total hip arthroplasty.  Subjective:  Chief Complaint: right hip pain  HPI: [name], 84 y.o. female, has a history of pain and functional disability in the right hip(s) due to arthritis and patient has failed non-surgical conservative treatments for greater than 12 weeks to include NSAID's and/or analgesics, corticosteriod injections, flexibility and strengthening excercises, use of assistive devices, weight reduction as appropriate and activity modification.  Onset of symptoms was gradual starting 3 years ago with gradually worsening course since that time.The patient noted no past surgery on the right hip(s).  Patient currently rates pain in the right hip at 9 out of 10 with activity. Patient has night pain, worsening of pain with activity and weight bearing, trendelenberg gait, pain that interfers with activities of daily living and pain with passive range of motion. Patient has evidence of subchondral cysts, subchondral sclerosis, periarticular osteophytes and joint space narrowing by imaging studies. This condition presents safety issues increasing the risk of falls.  There is no current active infection.  Patient Active Problem List   Diagnosis Date Noted  . Unilateral primary osteoarthritis, right hip 09/12/2018  . Degenerative joint disease 05/30/2018  . Anal fissure 11/10/2017  . Rectal bleeding 11/10/2017  . Rectal pain 11/10/2017  . Inflammatory arthritis 10/13/2017  . Latent tuberculosis by blood test 10/13/2017  . Chest pain with high risk for cardiac etiology 08/11/2017  . Chronic diastolic CHF (congestive heart failure), NYHA class 2 ([place]) 08/11/2017  . Fatigue due to treatment 01/09/2017  . NSTEMI (non-ST elevated myocardial infarction) ([place]) 11/01/2016  . Diabetes mellitus ([place]) 10/24/2015  . Non-insulin dependent type 2 diabetes mellitus ([place]) 10/24/2015  . Angina, class I ([place]) 04/22/2015  . DOE (dyspnea on  exertion) 04/22/2015  . Leg edema, left 04/22/2015  . Accumulation of fluid in tissues 03/21/2014  . Vertigo, central 01/02/2014  . Vertigo 01/02/2014  . Limb pain- echymosis, pain Lt radial artery after cath 12/11/2013  . Chest pain with low risk for cardiac etiology- patent grafts on cath 11/26/13; ? microvascular ischemia 11/28/2013  . Dyslipidemia, goal LDL below 70 05/17/2013  . Accelerated hypertension with diastolic congestive heart failure, NYHA class 3 ([place]) 05/17/2013  . Claudication ([place]) 05/16/2013  . PTA/ Stent Rt popliteal and Rt peroneal artery 05/16/13 05/16/2013  . PAD,severe calcified pop and tibial peroneal trunk disease bilateraly   . ABDOMINAL PAIN RIGHT LOWER QUADRANT 10/07/2010  . CVA-STROKE 03/04/2010  . BARRETT'S ESOPHAGUS 03/04/2010  . FECAL INCONTINENCE 03/04/2010  . DM 01/14/2010  . ANEMIA OF CHRONIC DISEASE 01/14/2010  . Atherosclerotic heart disease of native coronary artery with angina pectoris ([place]) 01/14/2010  . DIVERTICULOSIS OF COLON 01/14/2010  . PERSONAL HISTORY OF COLONIC POLYPS 01/14/2010  . [place] ALLERGY 01/14/2010   Past Medical History:  Diagnosis Date  . Atherosclerotic heart disease native coronary artery w/angina pectoris ([place]) 1992   a. 1992 s/p PTCA of LAD;  b. 1998 CABG x 3; c. 07/2001 Cath: Sev LM/LAD/LCX dzs, 3/3 patent grafts; d. 11/2013 Cath: stable graft anatomy; e. 10/2016 Cath: native 3VD, VG->OM nl, LIMA->LAD nl, VG->Diag 55.  . Bradycardia, mild briefly to the 40s, asymptomatic 05/16/2013  . Carotid arterial disease ([place])    a. 05/2014 Carotid U/S: No signif bilat ICA stenosis, >60% L ECA.  Marland Kitchen Chronic anemia   . Chronic diastolic CHF (congestive heart failure) ([place])    a. 10/2016 Echo: Ef 55-60%, no rwma, Gr1 DD, triv AI, PASP 41mmHg, atrial septal  aneurysm.  . Chronic diastolic CHF (congestive heart failure), NYHA class 2 ([place]) 08/11/2017  . DM (diabetes mellitus), type 2 with peripheral vascular complications ([facility])    On Oral  medications.  . Essential hypertension   . GERD (gastroesophageal reflux disease)   . Hyperlipidemia with target LDL less than 70   . PAD (peripheral artery disease) ([place]) 05/2013; 09/2013   a. severe calcified POP-1 & TP Trunk Dz bilat;  b . 05/2013 Diamondback Rot Athrectomy (R POP) --> PTA R Pop, DES to R Peroneal;  c. 09/2013 PTA/Stenting of L Pop [name] - retrograde access);  d. 04/2014 ABI's: R/L: 1.1/1.1.  Marland Kitchen Peptic ulcer disease   . S/P CABG x 3 1998   LIMA-LAD, SVG-OM, SVG-D1  . Severe claudication ([place]) 05/2013   Referred for Peripheral Angio (Dr. Gwenlyn Found --> Dr. Brunetta Jeans in [place])    Past Surgical History:  Procedure Laterality Date  . ATHERECTOMY Right 05/15/2013   Procedure: ATHERECTOMY;  Surgeon: Lorretta Harp, MD;  Location: [facility] CATH LAB;  Service: Cardiovascular;  Laterality: Right;  popliteal  . BACK SURGERY    . CARDIAC CATHETERIZATION  07/17/01   2 v CAD with LM, circ, LAD, obtuse diag, mod stenosis of diag vein graft , mild stenosis of marg vein graft, nl EF, medical treatment  . CARDIAC CATHETERIZATION  11/2013   Widely patent LIMA-LAD & SVG-OM with mild progression of proximal stenosis ~40-50% in SVG-D1; Widely patent native RCA with known severe native LCA disease  . CARDIAC CATHETERIZATION N/A 11/01/2016   Procedure: Left Heart Cath and Cors/Grafts Angiography;  Surgeon: [name], MD;  Location: [facility] CV LAB;  Service: Cardiovascular: Hemodynamics: High LVEDP!.  Cors/Grafts: LM 55%, o-p LAD 100% then after D1 -> LIMA-LAD patent, SVG-Diag ~55%. small RI wiht ~70% ostial. O-p Cx 80% - pCx 70% --> SVG-bifurcating OM patent. -- med Rx  . CORONARY ANGIOPLASTY  1992   PCI to LAD  . CORONARY ARTERY BYPASS GRAFT  1998   LIMA-LAD, SVG-diagonal (none roughly 50% stenosis), SVG-OM  . LEFT HEART CATHETERIZATION WITH CORONARY ANGIOGRAM N/A 11/27/2013   Procedure: LEFT HEART CATHETERIZATION WITH CORONARY ANGIOGRAM;  Surgeon: [name], MD;  Location: [facility] CATH LAB;   Service: Cardiovascular;  Laterality: N/A;  . LOW EXTREMITY DOPPLERS/ABI  10/2016   Patent right SFA, popliteal and peroneal tendons. Patent left SFA and popliteal stent. 30-50% stenosis in the right common femoral and popliteal arteries, 50-74% stenosis in left profunda femoris artery. --> 1 year follow-up.  RABI - 1.2, LABI 1.1  . LOWER EXTREMITY ANGIOGRAM N/A 02/22/2013   Procedure: LOWER EXTREMITY ANGIOGRAM;  Surgeon: Lorretta Harp, MD;  Location: [facility] CATH LAB;  Service: Cardiovascular;  Laterality: N/A;  . LOWER EXTREMITY ANGIOGRAM N/A 07/20/2013   Procedure: LOWER EXTREMITY ANGIOGRAM;  Surgeon: Lorretta Harp, MD;  Location: [facility] CATH LAB;  Service: Cardiovascular;  Laterality: N/A;  . NM MYOVIEW LTD  04/03/2013; 5/26'/2016   Lexiscan: a)  Apical perfusion defect with no ischemia. Consider prior infarct versus breast attenuation.;; b) 5/'16: LOW RISK, Nl EF ~55-65%, No Ischemia /Infarction  . NM MYOVIEW LTD  08/12/2017   Multiple low RISK. Normal study. No EKG changes. 1.5 mm T wave inversions noted in V2-V4 that began 1 minute into stress. -Ended 3 minutes into rest.  EF 55-65%  . PERCUTANEOUS STENT INTERVENTION Right 05/15/2013   Procedure: PERCUTANEOUS STENT INTERVENTION;  Surgeon: Lorretta Harp, MD;  Location[facility] CATH LAB;  Service: Cardiovascular;  Laterality: Right;  tibioperoneal trunk  . Peroneal Artery Stent  05/15/13   Diamondback rot. atherectomy of high grade segmental popliteal stenosis then Chocolate balloonPTA; then angiosculpt PTA of the prox peroneal with stent-Xpedition   . pv angio  07/20/13   high-grade calcified popliteal disease with one-vessel runoff via a small anterior tibial artery that has proximal disease as well, no intervention  . TRANSTHORACIC ECHOCARDIOGRAM  10/2016   EF 55-60%. Gr 1 DD. Normal PAP. Aortic Sclerosis.  . TRANSTHORACIC ECHOCARDIOGRAM  02/2011; 04/2015   a) EF 50-55%, mild inf HK;; b)  Nl LV Size & Fxn EF 60-65%. mild LVH, Gr 1 DD.  . TUBAL LIGATION       Current Facility-Administered Medications  Medication Dose Route Frequency Provider Last Rate Last Dose  . ceFAZolin (ANCEF) IVPB 2g/100 mL premix  2 g Intravenous On Call to OR Pete Pelt, PA-C      . chlorhexidine (HIBICLENS) 4 % liquid 4 application  60 mL Topical Once Pete Pelt, PA-C      . lactated ringers infusion   Intravenous Continuous Myrtie Soman, MD 50 mL/hr at 10/12/18 0846    . tranexamic acid (CYKLOKAPRON) IVPB 1,000 mg  1,000 mg Intravenous To OR Pete Pelt, PA-C       Allergies  Allergen Reactions  . Fish Allergy Hives and Shortness Of Breath  . Ivp Dye [Iodinated Diagnostic Agents] Shortness Of Breath and Anaphylaxis  . Latex Anaphylaxis, Hives, Shortness Of Breath, Itching and Other (See Comments)    REACTION: wheezing  . Shellfish Allergy Anaphylaxis, Hives, Shortness Of Breath and Other (See Comments)    All seafood  . Shellfish-Derived Products Anaphylaxis and Hives  . Other Itching and Other (See Comments)    Patient reports allergy to perfumed detergents. Causes itching.  . Metformin Nausea Only and Nausea And Vomiting    Social History   Tobacco Use  . Smoking status: Never Smoker  . Smokeless tobacco: Never Used  Substance Use Topics  . Alcohol use: No    Comment: rare glass of wine     Family History  Problem Relation Age of Onset  . Hypertension Mother   . Hyperlipidemia Mother   . Hyperlipidemia Father   . Hypertension Father      Review of Systems  Musculoskeletal: Positive for back pain and joint pain.  All other systems reviewed and are negative.   Objective:  Physical Exam  Constitutional: She is oriented to person, place, and time. She appears well-developed and well-nourished.  HENT:  Head: Normocephalic and atraumatic.  Eyes: Pupils are equal, round, and reactive to light. EOM are normal.  Neck: Normal range of motion. Neck supple.  Cardiovascular: Normal rate and regular rhythm.  Respiratory: Effort  normal and breath sounds normal.  GI: Soft. Bowel sounds are normal.  Musculoskeletal:       Right hip: She exhibits decreased range of motion, decreased strength, tenderness and bony tenderness.  Neurological: She is alert and oriented to person, place, and time.  Skin: Skin is warm and dry.  Psychiatric: She has a normal mood and affect.    Vital signs in last 24 hours: Temp:  [98.4 F (36.9 C)] 98.4 F (36.9 C) (10/31 0821) Pulse Rate:  [74] 74 (10/31 0821) Resp:  [20] 20 (10/31 0821) BP: (151-186)/(62-72) 151/62 (10/31 0844) SpO2:  [95 %] 95 % (10/31 0821) Weight:  [72.6 kg] 72.6 kg (10/31 0844)  Labs:   Estimated body mass index is 29.26  kg/m as calculated from the following:   Height as of this encounter: 5\' 2"  (1.575 m).   Weight as of this encounter: 72.6 kg.   Imaging Review Plain radiographs demonstrate severe degenerative joint disease of the right hip(s). The bone quality appears to be good for age and reported activity level.    Preoperative templating of the joint replacement has been completed, documented, and submitted to the Operating Room personnel in order to optimize intra-operative equipment management.     Assessment/Plan:  End stage arthritis, right hip(s)  The patient history, physical examination, clinical judgement of the provider and imaging studies are consistent with end stage degenerative joint disease of the right hip(s) and total hip arthroplasty is deemed medically necessary. The treatment options including medical management, injection therapy, arthroscopy and arthroplasty were discussed at length. The risks and benefits of total hip arthroplasty were presented and reviewed. The risks due to aseptic loosening, infection, stiffness, dislocation/subluxation,  thromboembolic complications and other imponderables were discussed.  The patient acknowledged the explanation, agreed to proceed with the plan and consent was signed. Patient is being  admitted for inpatient treatment for surgery, pain control, PT, OT, prophylactic antibiotics, VTE prophylaxis, progressive ambulation and ADL's and discharge planning.The patient is planning to be discharged home with home health services vs skilled nursing depending on her progress with mobility post-op.

## 2018-10-13 ENCOUNTER — Other Ambulatory Visit: Payer: Self-pay

## 2018-10-13 LAB — GLUCOSE, CAPILLARY
Glucose-Capillary: 106 mg/dL — ABNORMAL HIGH (ref 70–99)
Glucose-Capillary: 189 mg/dL — ABNORMAL HIGH (ref 70–99)
Glucose-Capillary: 145 mg/dL — ABNORMAL HIGH (ref 70–99)
Glucose-Capillary: 194 mg/dL — ABNORMAL HIGH (ref 70–99)

## 2018-10-13 LAB — BASIC METABOLIC PANEL
Anion gap: 6 (ref 5–15)
BUN: 11 mg/dL (ref 8–23)
CHLORIDE: 104 mmol/L (ref 98–111)
CO2: 30 mmol/L (ref 22–32)
Calcium: 8.4 mg/dL — ABNORMAL LOW (ref 8.9–10.3)
Creatinine, Ser: 0.85 mg/dL (ref 0.44–1.00)
GFR calc Af Amer: >60 mL/min (ref >60)
GFR calc non Af Amer: >60 mL/min (ref >60)
GLUCOSE: 114 mg/dL — AB (ref 70–99)
Potassium: 3.7 mmol/L (ref 3.5–5.1)
SODIUM: 140 mmol/L (ref 135–145)

## 2018-10-13 LAB — CBC
HEMATOCRIT: 31.3 % — AB (ref 36.0–46.0)
HEMOGLOBIN: 9.4 g/dL — AB (ref 12.0–15.0)
MCH: 27.9 pg (ref 26.0–34.0)
MCHC: 30 g/dL (ref 30.0–36.0)
MCV: 92.9 fL (ref 80.0–100.0)
Platelets: 176 10*3/uL (ref 150–400)
RBC: 3.37 MIL/uL — ABNORMAL LOW (ref 3.87–5.11)
RDW: 13.6 % (ref 11.5–15.5)
WBC: 5.5 10*3/uL (ref 4.0–10.5)
nRBC: 0 % (ref 0.0–0.2)

## 2018-10-13 NOTE — Progress Notes (Signed)
Subjective: 1 Day Post-Op Procedure(s) (LRB): RIGHT TOTAL HIP ARTHROPLASTY ANTERIOR APPROACH (Right) Patient reports pain as moderate.  Working with therapy this am.  Acute blood loss anemia from her surgery - tolerating well.  Vitals stable.  Objective: Vital signs in last 24 hours: Temp:  [97.5 F (36.4 C)-98.9 F (37.2 C)] 98.9 F (37.2 C) (11/01 0612) Pulse Rate:  [47-68] 68 (11/01 0612) Resp:  [11-20] 20 (11/01 0612) BP: (69-175)/(45-97) 138/53 (11/01 0612) SpO2:  [98 %-100 %] 100 % (11/01 0612)  Intake/Output from previous day: 10/31 0701 - 11/01 0700 In: 3223.1 [P.O.:340; I.V.:2683.1; IV Piggyback:200] Out: 1550 [Urine:1200; Blood:350] Intake/Output this shift: No intake/output data recorded.  Recent Labs    10/12/18 0830 10/13/18 0535  HGB 11.2* 9.4*   Recent Labs    10/12/18 0830 10/13/18 0535  WBC 4.7 5.5  RBC 4.02 3.37*  HCT 36.9 31.3*  PLT 245 176   Recent Labs    10/12/18 0830 10/13/18 0535  NA 140 140  K 3.3* 3.7  CL 102 104  CO2 29 30  BUN 18 11  CREATININE 1.03* 0.85  GLUCOSE 129* 114*  CALCIUM 9.0 8.4*   Recent Labs    10/12/18 0830  INR 0.98    Sensation intact distally Intact pulses distally Dorsiflexion/Plantar flexion intact Incision: scant drainage  Assessment/Plan: 1 Day Post-Op Procedure(s) (LRB): RIGHT TOTAL HIP ARTHROPLASTY ANTERIOR APPROACH (Right) Up with therapy Discharge to SNF next 2-3 days depending on bed availability and if she can go on the weekend.  Need to check CBC in am tomorrow.    Mcarthur Rossetti 10/13/2018, 9:01 AM

## 2018-10-13 NOTE — Progress Notes (Signed)
OT Cancellation Note  Patient Details Name: ATIYAH BAUER MRN: [id number] DOB: 08-29-1933   Cancelled Treatment:    Reason Eval/Treat Not Completed: Other (comment). Noted plan is for snf; will defer OT to that venue.  Nedda Gains 10/13/2018, 9:22 AM  Lesle Chris, OTR/L Acute Rehabilitation Services [phone] [facility] pager [phone] office 10/13/2018

## 2018-10-13 NOTE — Progress Notes (Signed)
Physical Therapy Treatment Patient Details Name: [name] MRN: [id number] DOB: July 08, 1933 Today's Date: 10/13/2018    History of Present Illness Pt s/p R THR and iwth hx of CABG, DM, CHF, CAD and back surgery.      PT Comments    Pt continues to require increased time for all tasks but with marked improvement in activity and WB tolerance noted this pm.   Follow Up Recommendations  SNF     Equipment Recommendations  None recommended by PT    Recommendations for Other Services       Precautions / Restrictions Precautions Precautions: Fall Restrictions Weight Bearing Restrictions: No Other Position/Activity Restrictions: WBAT    Mobility  Bed Mobility Overal bed mobility: Needs Assistance Bed Mobility: Sit to Supine       Sit to supine: Mod assist   General bed mobility comments: incresased time with cues for sequence and use of L LE to self assist.  PHysical assist to manage R LE and trunk  Transfers Overall transfer level: Needs assistance Equipment used: Rolling walker (2 wheeled) Transfers: Sit to/from Stand              Ambulation/Gait Ambulation/Gait assistance: Min assist;Mod assist Gait Distance (Feet): 24 Feet Assistive device: Rolling walker (2 wheeled) Gait Pattern/deviations: Step-to pattern;Decreased step length - right;Decreased step length - left;Shuffle;Trunk flexed Gait velocity: decr   General Gait Details: cues for sequence, posture and position from [employer]             Wheelchair Mobility    Modified Rankin (Stroke Patients Only)       Balance Overall balance assessment: Needs assistance Sitting-balance support: No upper extremity supported;Feet supported Sitting balance-Leahy Scale: Good     Standing balance support: Bilateral upper extremity supported Standing balance-Leahy Scale: Poor                              Cognition Arousal/Alertness: Awake/alert Behavior During Therapy: WFL for  tasks assessed/performed Overall Cognitive Status: Within Functional Limits for tasks assessed                                        Exercises      General Comments        Pertinent Vitals/Pain Pain Assessment: 0-10 Pain Score: 4  Pain Location: R hip Pain Descriptors / Indicators: Aching;Sore Pain Intervention(s): Limited activity within patient's tolerance;Premedicated before session;Ice applied    Home Living                      Prior Function            PT Goals (current goals can now be found in the care plan section) Acute Rehab PT Goals Patient Stated Goal: Regain IND PT Goal Formulation: With patient Time For Goal Achievement: 10/20/18 Potential to Achieve Goals: Fair Progress towards PT goals: Progressing toward goals    Frequency    7X/week      PT Plan Current plan remains appropriate    Co-evaluation              AM-PAC PT "6 Clicks" Daily Activity  Outcome Measure  Difficulty turning over in bed (including adjusting bedclothes, sheets and blankets)?: Unable Difficulty moving from lying on back to sitting on the side of the bed? : Unable Difficulty  sitting down on and standing up from a chair with arms (e.g., wheelchair, bedside commode, etc,.)?: Unable Help needed moving to and from a bed to chair (including a wheelchair)?: A Lot Help needed walking in hospital room?: A Lot Help needed climbing 3-5 steps with a railing? : Total 6 Click Score: 8    End of Session Equipment Utilized During Treatment: Gait belt Activity Tolerance: Patient limited by fatigue Patient left: in bed;with call bell/phone within reach;with bed alarm set Nurse Communication: Mobility status PT Visit Diagnosis: Difficulty in walking, not elsewhere classified (R26.2)     Time: 9811-9147 PT Time Calculation (min) (ACUTE ONLY): 23 min  Charges:  $Gait Training: 23-37 mins                     [place] Pager [phone] Office [phone]    Becky Berberian 10/13/2018, 2:20 PM

## 2018-10-13 NOTE — Progress Notes (Signed)
Bundle patient, see office CM note.

## 2018-10-13 NOTE — Evaluation (Signed)
Physical Therapy Evaluation Patient Details Name: [name] MRN: [id number] DOB: 08-29-1933 Today's Date: 10/13/2018   History of Present Illness  Pt s/p R THR and iwth hx of CABG, DM, CHF, CAD and back surgery.    Clinical Impression  Pt s/p R THR and presents with decreased R LE strength/ROM and post op pain limiting functional mobility.  Pt would benefit from follow up rehab at SNF level to maximize IND and safety prior to return home with limited assist.    Follow Up Recommendations SNF    Equipment Recommendations  None recommended by PT    Recommendations for Other Services       Precautions / Restrictions Precautions Precautions: Fall Restrictions Weight Bearing Restrictions: No Other Position/Activity Restrictions: WBAT      Mobility  Bed Mobility Overal bed mobility: Needs Assistance Bed Mobility: Supine to Sit     Supine to sit: Mod assist;+2 for physical assistance;+2 for safety/equipment     General bed mobility comments: incresased time with cues for sequence and use of L LE to self assist.  PHysical assist to manage R LE, to bring trunk to upright and to complete transition to EOB sitting useing bed pad  Transfers Overall transfer level: Needs assistance Equipment used: Rolling walker (2 wheeled) Transfers: Sit to/from [facility] Sit to Stand: Mod assist;+2 physical assistance;+2 safety/equipment Stand pivot transfers: Mod assist;+2 physical assistance;+2 safety/equipment       General transfer comment: cues for LE management and use of UEs to self assist.  Stand/pvt bed to [facility] and BSC to recliner  Ambulation/Gait             General Gait Details: stand pvts only - pt ltd by fatigue and c/o dizziness (BP153/83).  Pt with ltd WB on R LE  Stairs            Wheelchair Mobility    Modified Rankin (Stroke Patients Only)       Balance Overall balance assessment: Needs assistance Sitting-balance support: No upper  extremity supported;Feet supported Sitting balance-Leahy Scale: Good     Standing balance support: Bilateral upper extremity supported Standing balance-Leahy Scale: Poor                               Pertinent Vitals/Pain Pain Assessment: 0-10 Pain Score: 5  Pain Location: R hip Pain Descriptors / Indicators: Aching;Sore Pain Intervention(s): Limited activity within patient's tolerance;Monitored during session;Premedicated before session;Ice applied    Home Living Family/patient expects to be discharged to:: Skilled nursing facility                      Prior Function Level of Independence: Independent;Independent with assistive device(s)         Comments: used cane as needed     Hand Dominance        Extremity/Trunk Assessment   Upper Extremity Assessment Upper Extremity Assessment: Generalized weakness    Lower Extremity Assessment Lower Extremity Assessment: RLE deficits/detail RLE Deficits / Details: 2/5 strength at hip with AAROM at hip to 75 flex and 10 abd       Communication   Communication: No difficulties  Cognition Arousal/Alertness: Awake/alert Behavior During Therapy: WFL for tasks assessed/performed Overall Cognitive Status: Within Functional Limits for tasks assessed  General Comments      Exercises Total Joint Exercises Ankle Circles/Pumps: AROM;Both;15 reps;Supine Quad Sets: AROM;Both;10 reps;Supine Heel Slides: AAROM;Right;20 reps;Supine Hip ABduction/ADduction: AAROM;Right;15 reps;Supine   Assessment/Plan    PT Assessment Patient needs continued PT services  PT Problem List Decreased strength;Decreased range of motion;Decreased activity tolerance;Decreased balance;Decreased mobility;Decreased knowledge of use of DME;Pain       PT Treatment Interventions DME instruction;Gait training;Functional mobility training;Therapeutic activities;Therapeutic  exercise;Balance training;Patient/family education    PT Goals (Current goals can be found in the Care Plan section)  Acute Rehab PT Goals Patient Stated Goal: Regain IND PT Goal Formulation: With patient Time For Goal Achievement: 10/20/18 Potential to Achieve Goals: Fair    Frequency 7X/week   Barriers to discharge        Co-evaluation               AM-PAC PT "6 Clicks" Daily Activity  Outcome Measure Difficulty turning over in bed (including adjusting bedclothes, sheets and blankets)?: Unable Difficulty moving from lying on back to sitting on the side of the bed? : Unable Difficulty sitting down on and standing up from a chair with arms (e.g., wheelchair, bedside commode, etc,.)?: Unable Help needed moving to and from a bed to chair (including a wheelchair)?: A Lot Help needed walking in hospital room?: A Lot Help needed climbing 3-5 steps with a railing? : Total 6 Click Score: 8    End of Session Equipment Utilized During Treatment: Gait belt Activity Tolerance: Patient limited by fatigue Patient left: in chair;with call bell/phone within reach;with chair alarm set Nurse Communication: Mobility status PT Visit Diagnosis: Difficulty in walking, not elsewhere classified (R26.2)    Time: 2549-8264 PT Time Calculation (min) (ACUTE ONLY): 45 min   Charges:   PT Evaluation $PT Eval Low Complexity: 1 Low PT Treatments $Gait Training: 8-22 mins $Therapeutic Exercise: 8-22 mins        [place] Pager [phone] Office [phone]   [name] 10/13/2018, 10:41 AM

## 2018-10-13 NOTE — Care Plan (Signed)
Ortho Bundle Case Management Note  Patient Details  Name: [name] MRN: [id number] Date of Birth: [birth year]  Patient will discharge to SNF for short term rehab. She and her daughter have chosen [facility]. I have spoke with Jerilynn Som and they have a bed for her.  Please have SW complete FL2.                DME Arranged:  N/A DME Agency:     HH Arranged:    HH Agency:     Additional Comments: Please contact me with any questions of if this plan should need to change.  Ladell Heads,  [place] [facility]  [phone] 10/13/2018, 8:45 AM

## 2018-10-14 ENCOUNTER — Other Ambulatory Visit: Payer: Self-pay

## 2018-10-14 LAB — GLUCOSE, CAPILLARY
GLUCOSE-CAPILLARY: 170 mg/dL — AB (ref 70–99)
GLUCOSE-CAPILLARY: 108 mg/dL — AB (ref 70–99)
Glucose-Capillary: 161 mg/dL — ABNORMAL HIGH (ref 70–99)
Glucose-Capillary: 122 mg/dL — ABNORMAL HIGH (ref 70–99)
Glucose-Capillary: 145 mg/dL — ABNORMAL HIGH (ref 70–99)

## 2018-10-14 LAB — CBC
HCT: 30.4 % — ABNORMAL LOW (ref 36.0–46.0)
Hemoglobin: 9 g/dL — ABNORMAL LOW (ref 12.0–15.0)
MCH: 27.5 pg (ref 26.0–34.0)
MCHC: 29.6 g/dL — AB (ref 30.0–36.0)
MCV: 93 fL (ref 80.0–100.0)
PLATELETS: 186 10*3/uL (ref 150–400)
RBC: 3.27 MIL/uL — ABNORMAL LOW (ref 3.87–5.11)
RDW: 13.3 % (ref 11.5–15.5)
WBC: 7.8 10*3/uL (ref 4.0–10.5)
nRBC: 0 % (ref 0.0–0.2)

## 2018-10-14 LAB — TROPONIN I
TROPONIN I: 0.05 ng/mL — AB (ref <0.03)
TROPONIN I: 0.05 ng/mL — AB (ref <0.03)
Troponin I: 0.04 ng/mL (ref <0.03)

## 2018-10-14 MED ORDER — NITROGLYCERIN 0.4 MG SL SUBL
0.4000 mg | SUBLINGUAL_TABLET | SUBLINGUAL | Status: DC | PRN
  Administered 2018-10-14 (×2): 0.4 mg via SUBLINGUAL
  Filled 2018-10-14: qty 1

## 2018-10-14 MED ORDER — NITROGLYCERIN 0.4 MG SL SUBL
SUBLINGUAL_TABLET | SUBLINGUAL | Status: AC
  Filled 2018-10-14: qty 1

## 2018-10-14 MED ORDER — SODIUM CHLORIDE 0.9 % IV SOLN
INTRAVENOUS | Status: DC
  Administered 2018-10-14: 03:00:00 via INTRAVENOUS

## 2018-10-14 NOTE — Progress Notes (Signed)
NT informed me that patient was c/o chest pain and her o2 sats level was 89% on RA. Patient rated pain as 6/10 in mid chest and radiating towards her right shoulder. A stat EKG was done which was unremarkable.  V/S at 0241 BP 121/65, o2 sats87% on RA, HR 77. Rapid response nurse was notified of pt's complaints. Patient was assessed by rapid response nurse and Nitro 0.4 my x 3 was ordered. With no relieve in CP, a second dose of nitro was given at 0308. However, patient's BP began to fall after the second dose of nitro. NS at 10cc was started. Patient was still c/o CP and Dr Sharol Given was informed. Troponin x 3 was ordered. Will continue to monitor for changes in pain characteristic. BP at 0319 was 104/58.

## 2018-10-14 NOTE — Plan of Care (Signed)
Pt stable though still c/o heartburn and a "knott in her chest", md aware of this finding. No changes needed to care plans. Pt denies chest pain at this time and throughout am.

## 2018-10-14 NOTE — Progress Notes (Signed)
Physical Therapy Treatment Patient Details Name: [name] MRN: [id number] DOB: [birth year] Today's Date: 10/14/2018    History of Present Illness Pt s/p R THR and iwth hx of CABG, DM, CHF, CAD and back surgery.      PT Comments    Pt requiring increased time for all activities but motivated and progressing steadily with mobility.   Follow Up Recommendations  SNF     Equipment Recommendations  None recommended by PT    Recommendations for Other Services       Precautions / Restrictions Precautions Precautions: Fall Restrictions Weight Bearing Restrictions: No Other Position/Activity Restrictions: WBAT    Mobility  Bed Mobility Overal bed mobility: Needs Assistance Bed Mobility: Sit to Supine       Sit to supine: Mod assist   General bed mobility comments: incresased time with cues for sequence and use of L LE to self assist.  PHysical assist to manage R LE and trunk  Transfers Overall transfer level: Needs assistance Equipment used: Rolling walker (2 wheeled) Transfers: Sit to/from Stand Sit to Stand: Min assist;Mod assist         General transfer comment: cues for LE management and use of UEs to self assist.    Ambulation/Gait Ambulation/Gait assistance: Min assist Gait Distance (Feet): 34 Feet(twice) Assistive device: Rolling walker (2 wheeled) Gait Pattern/deviations: Step-to pattern;Decreased step length - right;Decreased step length - left;Shuffle;Trunk flexed Gait velocity: decr   General Gait Details: cues for sequence, posture and position from [employer]             Wheelchair Mobility    Modified Rankin (Stroke Patients Only)       Balance Overall balance assessment: Needs assistance Sitting-balance support: No upper extremity supported;Feet supported Sitting balance-Leahy Scale: Good     Standing balance support: Bilateral upper extremity supported Standing balance-Leahy Scale: Poor                              Cognition Arousal/Alertness: Awake/alert Behavior During Therapy: WFL for tasks assessed/performed Overall Cognitive Status: Within Functional Limits for tasks assessed                                        Exercises      General Comments        Pertinent Vitals/Pain Pain Assessment: 0-10 Pain Score: 3  Pain Location: R hip Pain Descriptors / Indicators: Aching;Sore Pain Intervention(s): Limited activity within patient's tolerance;Monitored during session;Premedicated before session;Ice applied    Home Living                      Prior Function            PT Goals (current goals can now be found in the care plan section) Acute Rehab PT Goals Patient Stated Goal: Regain IND PT Goal Formulation: With patient Time For Goal Achievement: 10/20/18 Potential to Achieve Goals: Fair Progress towards PT goals: Progressing toward goals    Frequency    7X/week      PT Plan Current plan remains appropriate    Co-evaluation              AM-PAC PT "6 Clicks" Daily Activity  Outcome Measure  Difficulty turning over in bed (including adjusting bedclothes, sheets and blankets)?: Unable Difficulty moving from lying on back  to sitting on the side of the bed? : Unable Difficulty sitting down on and standing up from a chair with arms (e.g., wheelchair, bedside commode, etc,.)?: Unable Help needed moving to and from a bed to chair (including a wheelchair)?: A Lot Help needed walking in hospital room?: A Lot Help needed climbing 3-5 steps with a railing? : A Lot 6 Click Score: 9    End of Session Equipment Utilized During Treatment: Gait belt Activity Tolerance: Patient limited by fatigue Patient left: in bed;with call bell/phone within reach;with bed alarm set Nurse Communication: Mobility status PT Visit Diagnosis: Difficulty in walking, not elsewhere classified (R26.2)     Time: 4174-0814 PT Time Calculation (min) (ACUTE ONLY): 25  min  Charges:  $Gait Training: 23-37 mins                     [place] Pager [phone] Office [phone]    [name] 10/14/2018, 3:53 PM

## 2018-10-14 NOTE — Progress Notes (Signed)
Third troponin came back 0.05.  MD paged and made aware. No new orders. RN will monitor.

## 2018-10-14 NOTE — Progress Notes (Signed)
Subjective: 2 Days Post-Op Procedure(s) (LRB): RIGHT TOTAL HIP ARTHROPLASTY ANTERIOR APPROACH (Right) Patient reports pain as mild.  Does report chest pain.  Rapid response called in the early morning hours.  Other than right-sided chest pain, she was not SOB or diaphoretic.  It did improve with mallox, but did not go away completely.  Her EKG did not show any acute changes.  Her first Troponin was only slightly elevated.  This morning she does not appear uncomfortable at all and has stable vitals.  She feels that it was related to her eating and has been afraid to eat.  Objective: Vital signs in last 24 hours: Temp:  [98.2 F (36.8 C)-101.7 F (38.7 C)] 99 F (37.2 C) (11/02 0338) Pulse Rate:  [56-82] 64 (11/02 0801) Resp:  [14-16] 16 (11/01 2223) BP: (74-151)/(43-83) 143/63 (11/02 0801) SpO2:  [94 %-100 %] 94 % (11/02 0801)  Intake/Output from previous day: 11/01 0701 - 11/02 0700 In: 997.9 [P.O.:720; I.V.:277.9] Out: 2525 [Urine:2525] Intake/Output this shift: Total I/O In: 480 [P.O.:480] Out: -   Recent Labs    10/12/18 0830 10/13/18 0535 10/14/18 0359  HGB 11.2* 9.4* 9.0*   Recent Labs    10/13/18 0535 10/14/18 0359  WBC 5.5 7.8  RBC 3.37* 3.27*  HCT 31.3* 30.4*  PLT 176 186   Recent Labs    10/12/18 0830 10/13/18 0535  NA 140 140  K 3.3* 3.7  CL 102 104  CO2 29 30  BUN 18 11  CREATININE 1.03* 0.85  GLUCOSE 129* 114*  CALCIUM 9.0 8.4*   Recent Labs    10/12/18 0830  INR 0.98    Sensation intact distally Intact pulses distally Dorsiflexion/Plantar flexion intact Incision: dressing C/D/I   Assessment/Plan: 2 Days Post-Op Procedure(s) (LRB): RIGHT TOTAL HIP ARTHROPLASTY ANTERIOR APPROACH (Right) Will continue to follow her Troponin levels and consult Cardiology if warranted. Skilled nursing post her acute hospital stay.    Mcarthur Rossetti 10/14/2018, 11:12 AM

## 2018-10-14 NOTE — Progress Notes (Signed)
CRITICAL VALUE ALERT  Critical Value: Troponin 0.04  Date & Time Notied:  10/14/18 @0512   Provider Notified: Dr Sharol Given  Orders Received/Actions taken: No Orders at this time

## 2018-10-14 NOTE — Progress Notes (Signed)
Dr. Ninfa Linden notified of second grey zone tropinin of 0.05. md ok with this finding. Pt stable in chair resting with no s/s of distress or pain. md also gave clearance for physical therapy to see pt as well.

## 2018-10-15 ENCOUNTER — Other Ambulatory Visit: Payer: Self-pay

## 2018-10-15 LAB — GLUCOSE, CAPILLARY
Glucose-Capillary: 152 mg/dL — ABNORMAL HIGH (ref 70–99)
Glucose-Capillary: 247 mg/dL — ABNORMAL HIGH (ref 70–99)
Glucose-Capillary: 310 mg/dL — ABNORMAL HIGH (ref 70–99)
Glucose-Capillary: 137 mg/dL — ABNORMAL HIGH (ref 70–99)

## 2018-10-15 LAB — CBC
HCT: 29.4 % — ABNORMAL LOW (ref 36.0–46.0)
Hemoglobin: 8.8 g/dL — ABNORMAL LOW (ref 12.0–15.0)
MCH: 27.5 pg (ref 26.0–34.0)
MCHC: 29.9 g/dL — ABNORMAL LOW (ref 30.0–36.0)
MCV: 91.9 fL (ref 80.0–100.0)
PLATELETS: 185 10*3/uL (ref 150–400)
RBC: 3.2 MIL/uL — ABNORMAL LOW (ref 3.87–5.11)
RDW: 13.3 % (ref 11.5–15.5)
WBC: 7.4 10*3/uL (ref 4.0–10.5)
nRBC: 0 % (ref 0.0–0.2)

## 2018-10-15 NOTE — Progress Notes (Signed)
Patient ID: [name], female   DOB: [birth year], 84 y.o.   MRN: [id number] Patient making slow progress with physical therapy.  She is up in a chair comfortable this morning.  Anticipate discharge to skilled nursing.

## 2018-10-15 NOTE — Progress Notes (Signed)
Physical Therapy Treatment Patient Details Name: [name] MRN: [id number] DOB: May 30, 1933 Today's Date: 10/15/2018    History of Present Illness Pt s/p R THR and iwth hx of CABG, DM, CHF, CAD and back surgery.      PT Comments    Marked improvement in activity tolerance this date with pt ambulating increased distance in hall and performing therex program with assist.   Follow Up Recommendations  SNF     Equipment Recommendations  None recommended by PT    Recommendations for Other Services       Precautions / Restrictions Precautions Precautions: Fall Restrictions Weight Bearing Restrictions: No Other Position/Activity Restrictions: WBAT    Mobility  Bed Mobility               General bed mobility comments: Pt up in chair and requests back to same  Transfers Overall transfer level: Needs assistance Equipment used: Rolling walker (2 wheeled) Transfers: Sit to/from Stand Sit to Stand: Min assist         General transfer comment: cues for LE management and use of UEs to self assist.    Ambulation/Gait Ambulation/Gait assistance: Min assist Gait Distance (Feet): 100 Feet Assistive device: Rolling walker (2 wheeled) Gait Pattern/deviations: Decreased step length - right;Decreased step length - left;Shuffle;Trunk flexed;Step-to pattern;Step-through pattern Gait velocity: decr   General Gait Details: cues for sequence, posture and position from [employer] Mobility    Modified Rankin (Stroke Patients Only)       Balance Overall balance assessment: Needs assistance Sitting-balance support: No upper extremity supported;Feet supported Sitting balance-Leahy Scale: Good     Standing balance support: Bilateral upper extremity supported Standing balance-Leahy Scale: Poor                              Cognition Arousal/Alertness: Awake/alert Behavior During Therapy: WFL for tasks  assessed/performed Overall Cognitive Status: Within Functional Limits for tasks assessed                                        Exercises Total Joint Exercises Ankle Circles/Pumps: AROM;Both;15 reps;Supine Quad Sets: AROM;Both;10 reps;Supine Heel Slides: AAROM;Right;20 reps;Supine Hip ABduction/ADduction: AAROM;Right;15 reps;Supine    General Comments        Pertinent Vitals/Pain Pain Assessment: 0-10 Pain Score: 3  Pain Location: R hip Pain Descriptors / Indicators: Aching;Sore Pain Intervention(s): Limited activity within patient's tolerance;Monitored during session;Premedicated before session;Ice applied    Home Living                      Prior Function            PT Goals (current goals can now be found in the care plan section) Acute Rehab PT Goals Patient Stated Goal: Regain IND PT Goal Formulation: With patient Time For Goal Achievement: 10/20/18 Potential to Achieve Goals: Fair Progress towards PT goals: Progressing toward goals    Frequency    7X/week      PT Plan Current plan remains appropriate    Co-evaluation              AM-PAC PT "6 Clicks" Daily Activity  Outcome Measure  Difficulty turning over in bed (including adjusting bedclothes, sheets and blankets)?: Unable Difficulty moving from lying on  back to sitting on the side of the bed? : Unable Difficulty sitting down on and standing up from a chair with arms (e.g., wheelchair, bedside commode, etc,.)?: Unable Help needed moving to and from a bed to chair (including a wheelchair)?: A Little Help needed walking in hospital room?: A Little Help needed climbing 3-5 steps with a railing? : A Lot 6 Click Score: 11    End of Session Equipment Utilized During Treatment: Gait belt Activity Tolerance: Patient tolerated treatment well Patient left: in chair;with call bell/phone within reach Nurse Communication: Mobility status PT Visit Diagnosis: Difficulty in  walking, not elsewhere classified (R26.2)     Time: 1779-3903 PT Time Calculation (min) (ACUTE ONLY): 31 min  Charges:  $Gait Training: 8-22 mins $Therapeutic Exercise: 8-22 mins                     [place] Pager [phone] Office [phone]    Amil Moseman 10/15/2018, 1:55 PM

## 2018-10-15 NOTE — Plan of Care (Signed)
Pt stable with no needs. No changes to note. Pt remains weak though getting stronger overall. No changes needed to care plans.

## 2018-10-16 ENCOUNTER — Encounter (HOSPITAL_COMMUNITY): Payer: Self-pay | Admitting: Orthopaedic Surgery

## 2018-10-16 DIAGNOSIS — M25551 Pain in right hip: Secondary | ICD-10-CM | POA: Diagnosis not present

## 2018-10-16 DIAGNOSIS — I25119 Atherosclerotic heart disease of native coronary artery with unspecified angina pectoris: Secondary | ICD-10-CM | POA: Diagnosis not present

## 2018-10-16 DIAGNOSIS — I252 Old myocardial infarction: Secondary | ICD-10-CM | POA: Diagnosis not present

## 2018-10-16 DIAGNOSIS — Z96641 Presence of right artificial hip joint: Secondary | ICD-10-CM | POA: Diagnosis not present

## 2018-10-16 DIAGNOSIS — Z227 Latent tuberculosis: Secondary | ICD-10-CM | POA: Diagnosis not present

## 2018-10-16 DIAGNOSIS — M2550 Pain in unspecified joint: Secondary | ICD-10-CM | POA: Diagnosis not present

## 2018-10-16 DIAGNOSIS — Z471 Aftercare following joint replacement surgery: Secondary | ICD-10-CM | POA: Diagnosis not present

## 2018-10-16 DIAGNOSIS — K219 Gastro-esophageal reflux disease without esophagitis: Secondary | ICD-10-CM | POA: Diagnosis not present

## 2018-10-16 DIAGNOSIS — K602 Anal fissure, unspecified: Secondary | ICD-10-CM | POA: Diagnosis not present

## 2018-10-16 DIAGNOSIS — K5730 Diverticulosis of large intestine without perforation or abscess without bleeding: Secondary | ICD-10-CM | POA: Diagnosis not present

## 2018-10-16 DIAGNOSIS — G894 Chronic pain syndrome: Secondary | ICD-10-CM | POA: Diagnosis not present

## 2018-10-16 DIAGNOSIS — Z7401 Bed confinement status: Secondary | ICD-10-CM | POA: Diagnosis not present

## 2018-10-16 DIAGNOSIS — Z951 Presence of aortocoronary bypass graft: Secondary | ICD-10-CM | POA: Diagnosis not present

## 2018-10-16 DIAGNOSIS — I5032 Chronic diastolic (congestive) heart failure: Secondary | ICD-10-CM | POA: Diagnosis not present

## 2018-10-16 DIAGNOSIS — I2510 Atherosclerotic heart disease of native coronary artery without angina pectoris: Secondary | ICD-10-CM | POA: Diagnosis not present

## 2018-10-16 DIAGNOSIS — I110 Hypertensive heart disease with heart failure: Secondary | ICD-10-CM | POA: Diagnosis not present

## 2018-10-16 DIAGNOSIS — F419 Anxiety disorder, unspecified: Secondary | ICD-10-CM | POA: Diagnosis not present

## 2018-10-16 DIAGNOSIS — I739 Peripheral vascular disease, unspecified: Secondary | ICD-10-CM | POA: Diagnosis not present

## 2018-10-16 DIAGNOSIS — D638 Anemia in other chronic diseases classified elsewhere: Secondary | ICD-10-CM | POA: Diagnosis not present

## 2018-10-16 DIAGNOSIS — E785 Hyperlipidemia, unspecified: Secondary | ICD-10-CM | POA: Diagnosis not present

## 2018-10-16 DIAGNOSIS — Z86010 Personal history of colonic polyps: Secondary | ICD-10-CM | POA: Diagnosis not present

## 2018-10-16 DIAGNOSIS — E1151 Type 2 diabetes mellitus with diabetic peripheral angiopathy without gangrene: Secondary | ICD-10-CM | POA: Diagnosis not present

## 2018-10-16 DIAGNOSIS — M1611 Unilateral primary osteoarthritis, right hip: Secondary | ICD-10-CM | POA: Diagnosis not present

## 2018-10-16 DIAGNOSIS — M1990 Unspecified osteoarthritis, unspecified site: Secondary | ICD-10-CM | POA: Diagnosis not present

## 2018-10-16 DIAGNOSIS — K2270 Barrett's esophagus without dysplasia: Secondary | ICD-10-CM | POA: Diagnosis not present

## 2018-10-16 DIAGNOSIS — I509 Heart failure, unspecified: Secondary | ICD-10-CM | POA: Diagnosis not present

## 2018-10-16 LAB — GLUCOSE, CAPILLARY: GLUCOSE-CAPILLARY: 150 mg/dL — AB (ref 70–99)

## 2018-10-16 LAB — TROPONIN I
TROPONIN I: 0.07 ng/mL — AB (ref <0.03)
Troponin I: 0.05 ng/mL (ref <0.03)

## 2018-10-16 MED ORDER — METHOCARBAMOL 500 MG PO TABS: 500.0000 mg | ORAL_TABLET | Freq: Four times a day (QID) | ORAL | 0 refills | Status: DC | PRN

## 2018-10-16 MED ORDER — HYDROCODONE-ACETAMINOPHEN 5-325 MG PO TABS: 1.0000 | ORAL_TABLET | ORAL | 0 refills | Status: DC | PRN

## 2018-10-16 NOTE — Progress Notes (Signed)
Called to patient's room by NT at approximately (2350 10/14/18) who stated pt was having increased chest pain. Noted patient holding right side of chest and burping. Stated she needs her cardiologist because she cannot take the pain which she describes as a "knot" under her left breast. Patient was given Maalox/Mylanta 30cc at approximately 2311 for indigestion. Vital signs were WNL (BP 141/64, HR 70 o2 sats 99% on RA). Spoke with RR nurse re: pt's current complaint. Cardiac involvement was ruled out based on physical assessment. However, due to pt's cardiac history, Dr Sharol Given was informed at approximately 0010 and orders for Troponin x 3 was ordered. Patient had Norco 2 tabs at 0030 with good effect. Received critical result 0.07 @ 0145 for 1st troponin level. Dr Sharol Given was informed at approximately [place]. No orders received at this time but was instructed to monitor next result. Patient is currently asleep and is in no obvious distress. Will continue to monitor for and changes in pain characteristic.

## 2018-10-16 NOTE — NC FL2 (Addendum)
[place] LEVEL OF CARE SCREENING TOOL     IDENTIFICATION  Patient Name: [name] Birthdate: 09/09/1933 Sex: female Admission Date (Current Location): 10/12/2018  [facility] and [place] Number:  Herbalist and Address:  [facility],  [address], [place]      Provider Number: [id number]  Attending Physician Name and Address:  Mcarthur Rossetti  Relative Name and Phone Number:       Current Level of Care: Hospital Recommended Level of Care: [place] Prior Approval Number:    Date Approved/Denied:   PASRR Number:   [id number]  Discharge Plan:      Current Diagnoses: Patient Active Problem List   Diagnosis Date Noted  . Status post total replacement of right hip 10/12/2018  . Unilateral primary osteoarthritis, right hip 09/12/2018  . Degenerative joint disease 05/30/2018  . Anal fissure 11/10/2017  . Rectal bleeding 11/10/2017  . Rectal pain 11/10/2017  . Inflammatory arthritis 10/13/2017  . Latent tuberculosis by blood test 10/13/2017  . Chest pain with high risk for cardiac etiology 08/11/2017  . Chronic diastolic CHF (congestive heart failure), NYHA class 2 ([place]) 08/11/2017  . Fatigue due to treatment 01/09/2017  . NSTEMI (non-ST elevated myocardial infarction) ([place]) 11/01/2016  . Diabetes mellitus ([place]) 10/24/2015  . Non-insulin dependent type 2 diabetes mellitus ([place]) 10/24/2015  . Angina, class I ([place]) 04/22/2015  . DOE (dyspnea on exertion) 04/22/2015  . Leg edema, left 04/22/2015  . Accumulation of fluid in tissues 03/21/2014  . Vertigo, central 01/02/2014  . Vertigo 01/02/2014  . Limb pain- echymosis, pain Lt radial artery after cath 12/11/2013  . Chest pain with low risk for cardiac etiology- patent grafts on cath 11/26/13; ? microvascular ischemia 11/28/2013  . Dyslipidemia, goal LDL below 70 05/17/2013  . Accelerated hypertension with diastolic congestive heart failure, NYHA  class 3 ([place]) 05/17/2013  . Claudication ([place]) 05/16/2013  . PTA/ Stent Rt popliteal and Rt peroneal artery 05/16/13 05/16/2013  . PAD,severe calcified pop and tibial peroneal trunk disease bilateraly   . ABDOMINAL PAIN RIGHT LOWER QUADRANT 10/07/2010  . CVA-STROKE 03/04/2010  . BARRETT'S ESOPHAGUS 03/04/2010  . FECAL INCONTINENCE 03/04/2010  . DM 01/14/2010  . ANEMIA OF CHRONIC DISEASE 01/14/2010  . Atherosclerotic heart disease of native coronary artery with angina pectoris ([place]) 01/14/2010  . DIVERTICULOSIS OF COLON 01/14/2010  . PERSONAL HISTORY OF COLONIC POLYPS 01/14/2010  . SHELLFISH ALLERGY 01/14/2010    Orientation RESPIRATION BLADDER Height & Weight     Self, Time, Situation, Place  Normal Continent Weight: 160 lb (72.6 kg) Height:  5\' 2"  (157.5 cm)  BEHAVIORAL SYMPTOMS/MOOD NEUROLOGICAL BOWEL NUTRITION STATUS      Continent Diet(Carb Modified )  AMBULATORY STATUS COMMUNICATION OF NEEDS Skin   Extensive Assist Verbally Surgical wounds(Right Hip )                       Personal Care Assistance Level of Assistance  Bathing, Feeding, Dressing Bathing Assistance: Limited assistance Feeding assistance: Independent Dressing Assistance: Limited assistance     Functional Limitations Info  Sight, Hearing, Speech Sight Info: Impaired Hearing Info: Adequate Speech Info: Adequate    SPECIAL CARE FACTORS FREQUENCY  PT (By licensed PT), OT (By licensed OT)     PT Frequency: 7x/week OT Frequency: 7x/week             Contractures Contractures Info: Not present    Additional Factors Info  Code Status,  Allergies, Psychotropic, Insulin Sliding Scale Code Status Info: Fullcode Allergies Info: Fish Allergy, Ivp Dye Iodinated Diagnostic Agents, Latex, Shellfish Allergy, Shellfish-derived Products, Other, Metformin   Insulin Sliding Scale Info: See medication list        Current Medications (10/16/2018):  This is the current hospital active medication list Current  Facility-Administered Medications  Medication Dose Route Frequency Provider Last Rate Last Dose  . 0.9 %  sodium chloride infusion   Intravenous Continuous Mcarthur Rossetti, MD   Stopped at 10/13/18 [phone]  . 0.9 %  sodium chloride infusion   Intravenous Continuous Mcarthur Rossetti, MD 10 mL/hr at 10/14/18 0326    . acetaminophen (TYLENOL) tablet 325-650 mg  325-650 mg Oral Q6H PRN Mcarthur Rossetti, MD      . alum & mag hydroxide-simeth (MAALOX/MYLANTA) 200-200-20 MG/5ML suspension 30 mL  30 mL Oral Q4H PRN Mcarthur Rossetti, MD   30 mL at 10/15/18 2311  . carvedilol (COREG) tablet 3.125 mg  3.125 mg Oral BID WC Mcarthur Rossetti, MD   3.125 mg at 10/16/18 0839  . clopidogrel (PLAVIX) tablet 75 mg  75 mg Oral Daily Mcarthur Rossetti, MD   75 mg at 10/16/18 0839  . diphenhydrAMINE (BENADRYL) 12.5 MG/5ML elixir 12.5-25 mg  12.5-25 mg Oral Q4H PRN Mcarthur Rossetti, MD      . docusate sodium (COLACE) capsule 100 mg  100 mg Oral BID Mcarthur Rossetti, MD   100 mg at 10/16/18 0841  . ezetimibe (ZETIA) tablet 10 mg  10 mg Oral QHS Mcarthur Rossetti, MD   10 mg at 10/15/18 2130  . folic acid (FOLVITE) tablet 1 mg  1 mg Oral Daily Mcarthur Rossetti, MD   1 mg at 10/16/18 5038  . furosemide (LASIX) tablet 30 mg  30 mg Oral Daily Mcarthur Rossetti, MD   30 mg at 10/16/18 0840  . gabapentin (NEURONTIN) capsule 300 mg  300 mg Oral TID Mcarthur Rossetti, MD   300 mg at 10/16/18 0841  . glimepiride (AMARYL) tablet 2 mg  2 mg Oral Q breakfast Mcarthur Rossetti, MD   2 mg at 10/16/18 0842  . HYDROcodone-acetaminophen (NORCO) 7.5-325 MG per tablet 1-2 tablet  1-2 tablet Oral Q4H PRN Mcarthur Rossetti, MD   2 tablet at 10/15/18 1143  . HYDROcodone-acetaminophen (NORCO/VICODIN) 5-325 MG per tablet 1-2 tablet  1-2 tablet Oral Q4H PRN Mcarthur Rossetti, MD   2 tablet at 10/16/18 [phone]  . isosorbide mononitrate (IMDUR) 24 hr tablet  60 mg  60 mg Oral Daily Mcarthur Rossetti, MD   60 mg at 10/16/18 0034  . LORazepam (ATIVAN) tablet 0.5 mg  0.5 mg Oral Q6H PRN Mcarthur Rossetti, MD      . menthol-cetylpyridinium (CEPACOL) lozenge 3 mg  1 lozenge Oral PRN Mcarthur Rossetti, MD       Or  . phenol (CHLORASEPTIC) mouth spray 1 spray  1 spray Mouth/Throat PRN Mcarthur Rossetti, MD      . methocarbamol (ROBAXIN) tablet 500 mg  500 mg Oral Q6H PRN Mcarthur Rossetti, MD   500 mg at 10/13/18 1258   Or  . methocarbamol (ROBAXIN) 500 mg in dextrose 5 % 50 mL IVPB  500 mg Intravenous Q6H PRN Mcarthur Rossetti, MD      . metoCLOPramide (REGLAN) tablet 5-10 mg  5-10 mg Oral Q8H PRN Mcarthur Rossetti, MD       Or  . metoCLOPramide (  REGLAN) injection 5-10 mg  5-10 mg Intravenous Q8H PRN Mcarthur Rossetti, MD   10 mg at 10/12/18 1730  . morphine 2 MG/ML injection 0.5-1 mg  0.5-1 mg Intravenous Q2H PRN Mcarthur Rossetti, MD   1 mg at 10/12/18 1627  . nitroGLYCERIN (NITROSTAT) SL tablet 0.4 mg  0.4 mg Sublingual Q5 min PRN Mcarthur Rossetti, MD   0.4 mg at 10/14/18 0308  . ondansetron (ZOFRAN) tablet 4 mg  4 mg Oral Q6H PRN Mcarthur Rossetti, MD       Or  . ondansetron [facility]) injection 4 mg  4 mg Intravenous Q6H PRN Mcarthur Rossetti, MD   4 mg at 10/12/18 1633  . pantoprazole (PROTONIX) EC tablet 40 mg  40 mg Oral BID AC Mcarthur Rossetti, MD   40 mg at 10/16/18 [phone]  . polyethylene glycol (MIRALAX / GLYCOLAX) packet 17 g  17 g Oral Daily PRN Mcarthur Rossetti, MD   17 g at 10/16/18 0836  . polyvinyl alcohol (LIQUIFILM TEARS) 1.4 % ophthalmic solution 1 drop  1 drop Both Eyes QHS Mcarthur Rossetti, MD   1 drop at 10/15/18 2132  . predniSONE (DELTASONE) tablet 5 mg  5 mg Oral Q breakfast Mcarthur Rossetti, MD   5 mg at 10/16/18 0839  . rosuvastatin (CRESTOR) tablet 20 mg  20 mg Oral QHS Mcarthur Rossetti, MD   20 mg at 10/15/18 2131  .  traMADol (ULTRAM) tablet 50-100 mg  50-100 mg Oral Q6H PRN Mcarthur Rossetti, MD   100 mg at 10/14/18 0126     Discharge Medications: Please see discharge summary for a list of discharge medications.  Relevant Imaging Results:  Relevant Lab Results:   Additional Information [id number]  Lia Hopping, LCSW

## 2018-10-16 NOTE — Progress Notes (Signed)
Patient will go to [place]. FL2 complete. PASRR done.  Patient will transport by [facility].  Kathrin Greathouse, Marlinda Mike, MSW Clinical Social Worker  [phone] 10/16/2018  9:26 AM

## 2018-10-16 NOTE — Progress Notes (Signed)
Patient ID: [name], female   DOB: December 31, 1932, 84 y.o.   MRN: [id number] The patient continues to have intermittent vague right-sided epigastric pain that seems to be more related to a GI issue than anything.  She complains of chest pain, however her vital signs are stable and normal and her EKG shows no acute findings.  Her symptoms do subside with pain medication and a combination of Maalox and Mylanta.  She is comfortable this morning.  Of note she does take Protonix twice daily and she is on this.  One set of troponin levels over the weekend were not concerning.  A new set has been ordered but is not back yet.  The remainder of her labs are normal.  On exam today again she appears comfortable and other than her GI symptoms, her abdomen is soft and her vitals are stable.  Her right hip is stable and the dressing is changed.  I am not sure what else we have to offer based on these GI symptoms.  This does not need further inpatient work-up and I would feel comfortable sending her to short-term skilled nursing.  It seems that most of her symptoms occur at the same time each night and it would be worth noting for skilled nursing to likely provide Maalox and Mylanta late in the evening to help with the symptoms.  Unless there are any other issues, she can be discharged to skilled nursing this afternoon.

## 2018-10-16 NOTE — Discharge Instructions (Signed)

## 2018-10-16 NOTE — Significant Event (Signed)
Rapid Response Event Note  Overview: Time Called: 0000 Arrival Time: 0005 Event Type: Cardiac(Chest Pain)  Initial Focused Assessment:  Called in reference to Chest Pain.  Arrived to find patient resting in bed, A/Ox4, concerned about some Chest Pain.  Patient describes pain as epigastric to underneath R breast in nature and "feels like a knot".  Pain increases with a deep breath and is tender to palpation.  Large amount of burping also noted.  12-lead EKG done prior to my arrival and is normal.  Due to pt's "chest pain" being reproducible and changing with respiration do not see anything that points to her pain source as being cardiac.  VS on dinamap are WNL.  Maalox had already been given.  I advised primary RN to call the patient's on-call physician to let them be aware and might recommend to check a Troponin level and get their opinion on how they would like the patient's pain to be cared for.  Patient currently in no distress.   Interventions:  12-lead EKG, assessment by Rapid Response Nurse  Plan of Care (if not transferred):  Notify on-call physician of situation and recommend to check a Troponin level and get their opinion on how they would like the patient's pain to be cared for.  If any changes in patient's condition, please call rapid response nurse back at [phone].  Event Summary:  Outcome: Stayed in room and stabalized  Event End Time: [place] ICU/SD RN IV / Care Coordinator / Rapid Response Nurse Rapid Response Number:  [phone]

## 2018-10-16 NOTE — Progress Notes (Signed)
At 1045, report was give to a nurse named [place] at [facility]. Kizzie Fantasia reported no further questions or concerns.

## 2018-10-16 NOTE — Discharge Summary (Signed)
Patient ID: [name] MRN: [id number] DOB/AGE: Aug 23, 1933 84 y.o.  Admit date: 10/12/2018 Discharge date: 10/16/2018  Admission Diagnoses:  Principal Problem:   Unilateral primary osteoarthritis, right hip Active Problems:   Status post total replacement of right hip   Discharge Diagnoses:  Same  Past Medical History:  Diagnosis Date  . Atherosclerotic heart disease native coronary artery w/angina pectoris ([place]) 1992   a. 1992 s/p PTCA of LAD;  b. 1998 CABG x 3; c. 07/2001 Cath: Sev LM/LAD/LCX dzs, 3/3 patent grafts; d. 11/2013 Cath: stable graft anatomy; e. 10/2016 Cath: native 3VD, VG->OM nl, LIMA->LAD nl, VG->Diag 55.  . Bradycardia, mild briefly to the 40s, asymptomatic 05/16/2013  . Carotid arterial disease ([place])    a. 05/2014 Carotid U/S: No signif bilat ICA stenosis, >60% L ECA.  Marland Kitchen Chronic anemia   . Chronic diastolic CHF (congestive heart failure) ([place])    a. 10/2016 Echo: Ef 55-60%, no rwma, Gr1 DD, triv AI, PASP 12mmHg, atrial septal aneurysm.  . Chronic diastolic CHF (congestive heart failure), NYHA class 2 ([place]) 08/11/2017  . DM (diabetes mellitus), type 2 with peripheral vascular complications (HCC)    On Oral medications.  . Essential hypertension   . GERD (gastroesophageal reflux disease)   . Hyperlipidemia with target LDL less than 70   . PAD (peripheral artery disease) ([place]) 05/2013; 09/2013   a. severe calcified POP-1 & TP Trunk Dz bilat;  b . 05/2013 [place] Rot Athrectomy (R POP) --> PTA R Pop, DES to R Peroneal;  c. 09/2013 PTA/Stenting of L Pop [name] - retrograde access);  d. 04/2014 ABI's: R/L: 1.1/1.1.  Marland Kitchen Peptic ulcer disease   . S/P CABG x 3 1998   LIMA-LAD, SVG-OM, SVG-D1  . Severe claudication ([place]) 05/2013   Referred for Peripheral Angio (Dr. Gwenlyn Found --> Dr. Brunetta Jeans in [place])    Surgeries: Procedure(s): RIGHT TOTAL HIP ARTHROPLASTY ANTERIOR APPROACH on 10/12/2018   Consultants:   Discharged Condition: Improved  Hospital Course: [name] is an 84 y.o. female who was admitted 10/12/2018 for operative treatment ofUnilateral primary osteoarthritis, right hip. Patient has severe unremitting pain that affects sleep, daily activities, and work/hobbies. After pre-op clearance the patient was taken to the operating room on 10/12/2018 and underwent  Procedure(s): RIGHT TOTAL HIP ARTHROPLASTY ANTERIOR APPROACH.    Patient was given perioperative antibiotics:  Anti-infectives (From admission, onward)   Start     Dose/Rate Route Frequency Ordered Stop   10/12/18 1800  ceFAZolin (ANCEF) IVPB 1 g/50 mL premix     1 g 100 mL/hr over 30 Minutes Intravenous Every 6 hours 10/12/18 1606 10/13/18 0016   10/12/18 0815  ceFAZolin (ANCEF) IVPB 2g/100 mL premix     2 g 200 mL/hr over 30 Minutes Intravenous On call to O.R. 10/12/18 0806 10/12/18 1121       Patient was given sequential compression devices, early ambulation, and chemoprophylaxis to prevent DVT.  Patient benefited maximally from hospital stay and there were no complications.  The patient did have episodes of epigastric pain both Saturday night and Sunday night.  A cardiac work-up was negative.  Her pain seemed to be more GI related than heart related.  She is on Protonix twice daily prior to this admission.  With chest pain she was given a combination of Maalox and Mylanta as well as hydrocodone and this would calm her symptoms down.  Her vital signs stayed stable throughout the hospitalization.  Her laboratory results also stayed normal.  It was felt she can be discharged safely skilled nursing for continued rehabilitation status post her right total hip arthroplasty.  Recent vital signs:  Patient Vitals for the past 24 hrs:  BP Temp Temp src Pulse Resp SpO2  10/16/18 0642 (!) 143/56 98.8 F (37.1 C) Oral 61 20 100 %  10/15/18 2144 128/72 98.7 F (37.1 C) Oral 71 20 100 %  10/15/18 1416 (!) 127/59 98 F (36.7 C) Oral 63 16 100 %  10/15/18 0842 - - - - - 96 %     Recent  laboratory studies:  Recent Labs    10/14/18 0359 10/15/18 0450  WBC 7.8 7.4  HGB 9.0* 8.8*  HCT 30.4* 29.4*  PLT 186 185     Discharge Medications:   Allergies as of 10/16/2018      Reactions   Fish Allergy Hives, Shortness Of Breath   Ivp Dye [iodinated Diagnostic Agents] Shortness Of Breath, Anaphylaxis   Latex Anaphylaxis, Hives, Shortness Of Breath, Itching, Other (See Comments)   REACTION: wheezing   Shellfish Allergy Anaphylaxis, Hives, Shortness Of Breath, Other (See Comments)   All seafood   Shellfish-derived Products Anaphylaxis, Hives   Other Itching, Other (See Comments)   Patient reports allergy to perfumed detergents. Causes itching.   Metformin Nausea Only, Nausea And Vomiting      Medication List    STOP taking these medications   traMADol 50 MG tablet Commonly known as:  ULTRAM     TAKE these medications   acetaminophen 500 MG tablet Commonly known as:  TYLENOL Take 1,000 mg by mouth every 6 (six) hours as needed for moderate pain or headache.   carvedilol 3.125 MG tablet Commonly known as:  COREG Take 1 tablet (3.125 mg total) by mouth 2 (two) times daily.   CO Q 10 PO Take 1 tablet by mouth daily.   ezetimibe 10 MG tablet Commonly known as:  ZETIA Take 10 mg by mouth at bedtime.   folic acid 1 MG tablet Commonly known as:  FOLVITE Take 1 mg by mouth daily.   furosemide 20 MG tablet Commonly known as:  LASIX Take 1.5 tablets (30 mg total) by mouth every morning.   gabapentin 300 MG capsule Commonly known as:  NEURONTIN Take 1 capsule (300 mg total) by mouth 3 (three) times daily.   glimepiride 1 MG tablet Commonly known as:  AMARYL Take 2 mg by mouth daily with breakfast.   HYDROcodone-acetaminophen 5-325 MG tablet Commonly known as:  NORCO/VICODIN Take 1-2 tablets by mouth every 4 (four) hours as needed for moderate pain (pain score 4-6).   isosorbide mononitrate 60 MG 24 hr tablet Commonly known as:  IMDUR TAKE 1 TABLET (60 MG  TOTAL) BY MOUTH DAILY. What changed:  See the new instructions.   LORazepam 0.5 MG tablet Commonly known as:  ATIVAN Take 0.5 mg by mouth every 6 (six) hours as needed for anxiety or sleep.   methocarbamol 500 MG tablet Commonly known as:  ROBAXIN Take 1 tablet (500 mg total) by mouth every 6 (six) hours as needed for muscle spasms.   methotrexate 2.5 MG tablet Commonly known as:  RHEUMATREX Take 10 mg by mouth every Monday.   nitroGLYCERIN 0.4 MG SL tablet Commonly known as:  NITROSTAT PLACE 1 TABLET (0.4 MG TOTAL) UNDER THE TONGUE EVERY 5 (FIVE) MINUTES AS NEEDED FOR CHEST PAIN.   pantoprazole 40 MG tablet Commonly known as:  PROTONIX Take 1 tablet (40 mg total) by mouth 2 (two)  times daily before a meal.   PLAVIX 75 MG tablet Generic drug:  clopidogrel Take 75 mg by mouth daily.   predniSONE 5 MG tablet Commonly known as:  DELTASONE Take 5 mg by mouth daily with breakfast.   rosuvastatin 20 MG tablet Commonly known as:  CRESTOR Take 20 mg by mouth at bedtime.   sulfaSALAzine 500 MG EC tablet Commonly known as:  AZULFIDINE Take 1,000 mg by mouth daily.   SYSTANE ULTRA 0.4-0.3 % Soln Generic drug:  Polyethyl Glycol-Propyl Glycol Place 1 drop into both eyes at bedtime.            Durable Medical Equipment  (From admission, onward)         Start     Ordered   10/12/18 1607  DME 3 n 1  Once     10/12/18 1606   10/12/18 1607  DME Walker rolling  Once    Question:  Patient needs a walker to treat with the following condition  Answer:  Status post total replacement of right hip   10/12/18 1606          Diagnostic Studies: Dg Pelvis Portable  Result Date: 10/12/2018 CLINICAL DATA:  Post right hip replacement EXAM: PORTABLE PELVIS 1-2 VIEWS COMPARISON:  MR right hip of 05/17/2017 FINDINGS: The right femoral and acetabular components of the total hip replacement appear to be in good position on the portable view obtained. No complicating features are seen.  There are significant degenerative changes noted within the left hip as well. IMPRESSION: 1. Right total hip replacement components in good position with no complicating features. 2. Significant degenerative change of the left hip. Electronically Signed   By: Ivar Drape M.D.   On: 10/12/2018 14:45   Dg C-arm 1-60 Min-no Report  Result Date: 10/12/2018 Fluoroscopy was utilized by the requesting physician.  No radiographic interpretation.   Dg Hip Operative Unilat W Or W/o Pelvis Right  Result Date: 10/12/2018 CLINICAL DATA:  Portable intraoperative images for right hip arthroplasty. EXAM: OPERATIVE RIGHT HIP (WITH PELVIS IF PERFORMED) 3 VIEWS TECHNIQUE: Fluoroscopic spot image(s) were submitted for interpretation post-operatively. COMPARISON:  04/06/2018 FINDINGS: Three submitted images show placement of a right total hip arthroplasty. The femoroacetabular components appear well seated and well aligned. No acute fracture or evidence of an operative complication. IMPRESSION: Well-positioned right total hip arthroplasty Electronically Signed   By: Lajean Manes M.D.   On: 10/12/2018 14:51    Disposition: Discharge disposition: 03-Skilled [place]    Mcarthur Rossetti, MD Follow up in 2 week(s).   Specialty:  Orthopedic Surgery Contact information: [name] [address] [phone]            Signed: Mcarthur Rossetti 10/16/2018, 7:34 AM

## 2018-10-18 DIAGNOSIS — G894 Chronic pain syndrome: Secondary | ICD-10-CM | POA: Diagnosis not present

## 2018-10-18 DIAGNOSIS — M1611 Unilateral primary osteoarthritis, right hip: Secondary | ICD-10-CM | POA: Diagnosis not present

## 2018-10-18 DIAGNOSIS — I2510 Atherosclerotic heart disease of native coronary artery without angina pectoris: Secondary | ICD-10-CM | POA: Diagnosis not present

## 2018-10-18 DIAGNOSIS — K219 Gastro-esophageal reflux disease without esophagitis: Secondary | ICD-10-CM | POA: Diagnosis not present

## 2018-10-18 DIAGNOSIS — I509 Heart failure, unspecified: Secondary | ICD-10-CM | POA: Diagnosis not present

## 2018-10-19 ENCOUNTER — Other Ambulatory Visit: Payer: Self-pay | Admitting: Orthopaedic Surgery

## 2018-10-19 DIAGNOSIS — M25551 Pain in right hip: Secondary | ICD-10-CM | POA: Diagnosis not present

## 2018-10-19 DIAGNOSIS — F419 Anxiety disorder, unspecified: Secondary | ICD-10-CM | POA: Diagnosis not present

## 2018-10-26 DIAGNOSIS — I110 Hypertensive heart disease with heart failure: Secondary | ICD-10-CM | POA: Diagnosis not present

## 2018-10-26 DIAGNOSIS — D649 Anemia, unspecified: Secondary | ICD-10-CM | POA: Diagnosis not present

## 2018-10-26 DIAGNOSIS — Z471 Aftercare following joint replacement surgery: Secondary | ICD-10-CM | POA: Diagnosis not present

## 2018-10-26 DIAGNOSIS — Z8673 Personal history of transient ischemic attack (TIA), and cerebral infarction without residual deficits: Secondary | ICD-10-CM | POA: Diagnosis not present

## 2018-10-26 DIAGNOSIS — Z96641 Presence of right artificial hip joint: Secondary | ICD-10-CM | POA: Diagnosis not present

## 2018-10-26 DIAGNOSIS — E1151 Type 2 diabetes mellitus with diabetic peripheral angiopathy without gangrene: Secondary | ICD-10-CM | POA: Diagnosis not present

## 2018-10-26 DIAGNOSIS — Z951 Presence of aortocoronary bypass graft: Secondary | ICD-10-CM | POA: Diagnosis not present

## 2018-10-26 DIAGNOSIS — K219 Gastro-esophageal reflux disease without esophagitis: Secondary | ICD-10-CM | POA: Diagnosis not present

## 2018-10-26 DIAGNOSIS — Z9181 History of falling: Secondary | ICD-10-CM | POA: Diagnosis not present

## 2018-10-26 DIAGNOSIS — I2510 Atherosclerotic heart disease of native coronary artery without angina pectoris: Secondary | ICD-10-CM | POA: Diagnosis not present

## 2018-10-26 DIAGNOSIS — Z7984 Long term (current) use of oral hypoglycemic drugs: Secondary | ICD-10-CM | POA: Diagnosis not present

## 2018-10-26 DIAGNOSIS — I5032 Chronic diastolic (congestive) heart failure: Secondary | ICD-10-CM | POA: Diagnosis not present

## 2018-10-27 ENCOUNTER — Other Ambulatory Visit: Payer: Self-pay | Admitting: Orthopaedic Surgery

## 2018-10-30 DIAGNOSIS — K219 Gastro-esophageal reflux disease without esophagitis: Secondary | ICD-10-CM | POA: Diagnosis not present

## 2018-10-30 DIAGNOSIS — I5032 Chronic diastolic (congestive) heart failure: Secondary | ICD-10-CM | POA: Diagnosis not present

## 2018-10-30 DIAGNOSIS — E1151 Type 2 diabetes mellitus with diabetic peripheral angiopathy without gangrene: Secondary | ICD-10-CM | POA: Diagnosis not present

## 2018-10-30 DIAGNOSIS — Z471 Aftercare following joint replacement surgery: Secondary | ICD-10-CM | POA: Diagnosis not present

## 2018-10-30 DIAGNOSIS — I2510 Atherosclerotic heart disease of native coronary artery without angina pectoris: Secondary | ICD-10-CM | POA: Diagnosis not present

## 2018-10-30 DIAGNOSIS — I110 Hypertensive heart disease with heart failure: Secondary | ICD-10-CM | POA: Diagnosis not present

## 2018-11-01 ENCOUNTER — Encounter (INDEPENDENT_AMBULATORY_CARE_PROVIDER_SITE_OTHER): Payer: Self-pay | Admitting: Orthopaedic Surgery

## 2018-11-01 ENCOUNTER — Ambulatory Visit (INDEPENDENT_AMBULATORY_CARE_PROVIDER_SITE_OTHER): Payer: Medicare Other | Admitting: Orthopaedic Surgery

## 2018-11-01 DIAGNOSIS — Z96641 Presence of right artificial hip joint: Secondary | ICD-10-CM

## 2018-11-01 MED ORDER — METHOCARBAMOL 500 MG PO TABS: 500.0000 mg | ORAL_TABLET | Freq: Four times a day (QID) | ORAL | 1 refills | Status: DC | PRN

## 2018-11-01 MED ORDER — HYDROCODONE-ACETAMINOPHEN 5-325 MG PO TABS: 1.0000 | ORAL_TABLET | ORAL | 0 refills | Status: DC | PRN

## 2018-11-01 NOTE — Progress Notes (Signed)
HPI: [name] returns now 20 days status post right total hip arthroplasty.  Her first postoperative visit.  She is at home.  She reports that she is doing well and working with therapy and feels like she is making good advancement but still having some throbbing pain in her hip and some muscle spasms.  She has pain especially with hip flexion.  She had no fevers chills shortness of breath chest pain.  She is on chronic Plavix.  Physical exam: General well-developed well-nourished female no acute distress mood affect appropriate Right hip: Surgical incisions well approximated with staples no signs of gross infection.  There is a little bit of maceration top of the incision though.  Calves are nontender.  She does have bilateral lower extremity swelling.  She is able to dorsiflex plantarflex both ankles.  Impression: Status post right total hip arthroplasty 10/12/2018  Plan: Continue to work on range of motion strengthening.  We will call in a muscle relaxant for she is been on Robaxin as tolerated as well.  Also call in some Norco.  See her back in 2 weeks for wound check.  Staples were removed today Steri-Strips applied.  She will keep the top of the incision clean and dry.

## 2018-11-02 ENCOUNTER — Encounter: Payer: Self-pay | Admitting: Physician Assistant

## 2018-11-02 ENCOUNTER — Ambulatory Visit (INDEPENDENT_AMBULATORY_CARE_PROVIDER_SITE_OTHER): Payer: Medicare Other | Admitting: Physician Assistant

## 2018-11-02 VITALS — BP 136/70 | HR 70 | Resp 16 | Ht 63.0 in | Wt 160.0 lb

## 2018-11-02 DIAGNOSIS — I10 Essential (primary) hypertension: Secondary | ICD-10-CM

## 2018-11-02 DIAGNOSIS — I5033 Acute on chronic diastolic (congestive) heart failure: Secondary | ICD-10-CM | POA: Diagnosis not present

## 2018-11-02 DIAGNOSIS — I25810 Atherosclerosis of coronary artery bypass graft(s) without angina pectoris: Secondary | ICD-10-CM

## 2018-11-02 DIAGNOSIS — I6523 Occlusion and stenosis of bilateral carotid arteries: Secondary | ICD-10-CM

## 2018-11-02 DIAGNOSIS — E785 Hyperlipidemia, unspecified: Secondary | ICD-10-CM | POA: Diagnosis not present

## 2018-11-02 DIAGNOSIS — E119 Type 2 diabetes mellitus without complications: Secondary | ICD-10-CM

## 2018-11-02 NOTE — Progress Notes (Signed)
Cardiology Office Note    Date:  11/04/2018   ID:  Yashika, Mask [birth year], MRN [id number]  PCP:  Jani Gravel, MD  Cardiologist: [name]  Chief Complaint  Patient presents with  . Hospitalization Follow-up    lower extremity edema    History of Present Illness:  [name] is a 84 y.o. female with PMH of CAD s/p CABG x 3 1998, asymptomatic bradycardia, carotid artery disease, chronic anemia, chronic diastolic heart failure, DM 2, hypertension, hyperlipidemia, PAD, and history of peptic ulcer disease.  She has preserved LV function after the bypass surgery.  She also had distal right SFA PTA and stenting of the right peroneal artery for life style limiting claudication.  Last cardiac catheterization was performed in November 2017 that showed severe three-vessel native disease, patent SVG to OM, patent LIMA to LAD, 50% disease in SVG to diagonal.  She is on the statin and the Plavix for her PAD.  She was recently seen by Sunday Spillers in September 2019 for preoperative clearance prior to her hip surgery.  Echocardiogram obtained on 09/14/2018 showed EF 65 to 70%, grade 1 DD, severe LAE, mild TR, PA peak pressure 25 mmHg.  Myoview performed on 09/15/2018 with EF of 52%, no obvious ischemia, overall low risk study.  Patient underwent right total hip surgery and was recently discharged on 10/16/2018.  Patient presents today for cardiology office visit.  Her lower extremity edema has worsened slightly after the recent right total hip surgery.  I think this is related to the amount of fluid she received in the hospital.  I instructed her to double up her Lasix for 3 days before going back to the previous dose.  She will eat more potassium rich food including green and spinach.  Otherwise she denies any obvious chest pain.  She is recovering well after the recent surgery but is still reliant on a rolling walker to get around.   Past Medical History:  Diagnosis Date  . Atherosclerotic  heart disease native coronary artery w/angina pectoris ([place]) 1992   a. 1992 s/p PTCA of LAD;  b. 1998 CABG x 3; c. 07/2001 Cath: Sev LM/LAD/LCX dzs, 3/3 patent grafts; d. 11/2013 Cath: stable graft anatomy; e. 10/2016 Cath: native 3VD, VG->OM nl, LIMA->LAD nl, VG->Diag 55.  . Bradycardia, mild briefly to the 40s, asymptomatic 05/16/2013  . Carotid arterial disease ([place])    a. 05/2014 Carotid U/S: No signif bilat ICA stenosis, >60% L ECA.  Marland Kitchen Chronic anemia   . Chronic diastolic CHF (congestive heart failure) ([place])    a. 10/2016 Echo: Ef 55-60%, no rwma, Gr1 DD, triv AI, PASP 49mmHg, atrial septal aneurysm.  . Chronic diastolic CHF (congestive heart failure), NYHA class 2 ([place]) 08/11/2017  . DM (diabetes mellitus), type 2 with peripheral vascular complications (HCC)    On Oral medications.  . Essential hypertension   . GERD (gastroesophageal reflux disease)   . Hyperlipidemia with target LDL less than 70   . PAD (peripheral artery disease) ([place]) 05/2013; 09/2013   a. severe calcified POP-1 & TP Trunk Dz bilat;  b . 05/2013 Diamondback Rot Athrectomy (R POP) --> PTA R Pop, DES to R Peroneal;  c. 09/2013 PTA/Stenting of L Pop [name] - retrograde access);  d. 04/2014 ABI's: R/L: 1.1/1.1.  Marland Kitchen Peptic ulcer disease   . S/P CABG x 3 1998   LIMA-LAD, SVG-OM, SVG-D1  . Severe claudication ([place]) 05/2013   Referred for Peripheral Angio (Dr. Gwenlyn Found -->  Dr. Brunetta Jeans in [place])    Past Surgical History:  Procedure Laterality Date  . ATHERECTOMY Right 05/15/2013   Procedure: ATHERECTOMY;  Surgeon: Lorretta Harp, MD;  Location: [facility] CATH LAB;  Service: Cardiovascular;  Laterality: Right;  popliteal  . BACK SURGERY    . CARDIAC CATHETERIZATION  07/17/01   2 v CAD with LM, circ, LAD, obtuse diag, mod stenosis of diag vein graft , mild stenosis of marg vein graft, nl EF, medical treatment  . CARDIAC CATHETERIZATION  11/2013   Widely patent LIMA-LAD & SVG-OM with mild progression of proximal stenosis ~40-50% in  SVG-D1; Widely patent native RCA with known severe native LCA disease  . CARDIAC CATHETERIZATION N/A 11/01/2016   Procedure: Left Heart Cath and Cors/Grafts Angiography;  Surgeon: [name], MD;  Location: [facility] CV LAB;  Service: Cardiovascular: Hemodynamics: High LVEDP!.  Cors/Grafts: LM 55%, o-p LAD 100% then after D1 -> LIMA-LAD patent, SVG-Diag ~55%. small RI wiht ~70% ostial. O-p Cx 80% - pCx 70% --> SVG-bifurcating OM patent. -- med Rx  . CORONARY ANGIOPLASTY  1992   PCI to LAD  . CORONARY ARTERY BYPASS GRAFT  1998   LIMA-LAD, SVG-diagonal (none roughly 50% stenosis), SVG-OM  . LEFT HEART CATHETERIZATION WITH CORONARY ANGIOGRAM N/A 11/27/2013   Procedure: LEFT HEART CATHETERIZATION WITH CORONARY ANGIOGRAM;  Surgeon: [name], MD;  Location: [facility] CATH LAB;  Service: Cardiovascular;  Laterality: N/A;  . LOW EXTREMITY DOPPLERS/ABI  10/2016   Patent right SFA, popliteal and peroneal tendons. Patent left SFA and popliteal stent. 30-50% stenosis in the right common femoral and popliteal arteries, 50-74% stenosis in left profunda femoris artery. --> 1 year follow-up.  RABI - 1.2, LABI 1.1  . LOWER EXTREMITY ANGIOGRAM N/A 02/22/2013   Procedure: LOWER EXTREMITY ANGIOGRAM;  Surgeon: Lorretta Harp, MD;  Location: [facility] CATH LAB;  Service: Cardiovascular;  Laterality: N/A;  . LOWER EXTREMITY ANGIOGRAM N/A 07/20/2013   Procedure: LOWER EXTREMITY ANGIOGRAM;  Surgeon: Lorretta Harp, MD;  Location: [facility] CATH LAB;  Service: Cardiovascular;  Laterality: N/A;  . NM MYOVIEW LTD  04/03/2013; 5/26'/2016   Lexiscan: a)  Apical perfusion defect with no ischemia. Consider prior infarct versus breast attenuation.;; b) 5/'16: LOW RISK, Nl EF ~55-65%, No Ischemia /Infarction  . NM MYOVIEW LTD  08/12/2017   Multiple low RISK. Normal study. No EKG changes. 1.5 mm T wave inversions noted in V2-V4 that began 1 minute into stress. -Ended 3 minutes into rest.  EF 55-65%  . PERCUTANEOUS STENT INTERVENTION Right  05/15/2013   Procedure: PERCUTANEOUS STENT INTERVENTION;  Surgeon: Lorretta Harp, MD;  Location: [facility] CATH LAB;  Service: Cardiovascular;  Laterality: Right;  tibioperoneal trunk  . Peroneal Artery Stent  05/15/13   Diamondback rot. atherectomy of high grade segmental popliteal stenosis then Chocolate balloonPTA; then angiosculpt PTA of the prox peroneal with stent-Xpedition   . pv angio  07/20/13   high-grade calcified popliteal disease with one-vessel runoff via a small anterior tibial artery that has proximal disease as well, no intervention  . TOTAL HIP ARTHROPLASTY Right 10/12/2018   Procedure: RIGHT TOTAL HIP ARTHROPLASTY ANTERIOR APPROACH;  Surgeon: Mcarthur Rossetti, MD;  Location: [facility] ORS;  Service: Orthopedics;  Laterality: Right;  . TRANSTHORACIC ECHOCARDIOGRAM  10/2016   EF 55-60%. Gr 1 DD. Normal PAP. Aortic Sclerosis.  . TRANSTHORACIC ECHOCARDIOGRAM  02/2011; 04/2015   a) EF 50-55%, mild inf HK;; b)  Nl LV Size & Fxn EF 60-65%. mild LVH, Gr 1  DD.  . TUBAL LIGATION      Current Medications: Outpatient Medications Prior to Visit  Medication Sig Dispense Refill  . acetaminophen (TYLENOL) 500 MG tablet Take 1,000 mg by mouth every 6 (six) hours as needed for moderate pain or headache.    . clopidogrel (PLAVIX) 75 MG tablet Take 75 mg by mouth daily.    . Coenzyme Q10 (CO Q 10 PO) Take 1 tablet by mouth daily.     Marland Kitchen ezetimibe (ZETIA) 10 MG tablet Take 10 mg by mouth at bedtime.     . folic acid (FOLVITE) 1 MG tablet Take 1 mg by mouth daily.  1  . furosemide (LASIX) 20 MG tablet Take 1.5 tablets (30 mg total) by mouth every morning. 30 tablet 1  . gabapentin (NEURONTIN) 300 MG capsule Take 1 capsule (300 mg total) by mouth 3 (three) times daily. 30 capsule 5  . glimepiride (AMARYL) 1 MG tablet Take 2 mg by mouth daily with breakfast.   12  . HYDROcodone-acetaminophen (NORCO/VICODIN) 5-325 MG tablet Take 1-2 tablets by mouth every 4 (four) hours as needed for moderate pain (pain score  4-6). 30 tablet 0  . isosorbide mononitrate (IMDUR) 60 MG 24 hr tablet TAKE 1 TABLET (60 MG TOTAL) BY MOUTH DAILY. (Patient taking differently: Take 60 mg by mouth daily. ) 30 tablet 9  . LORazepam (ATIVAN) 0.5 MG tablet Take 0.5 mg by mouth every 6 (six) hours as needed for anxiety or sleep.   5  . methocarbamol (ROBAXIN) 500 MG tablet Take 1 tablet (500 mg total) by mouth every 6 (six) hours as needed for muscle spasms. 40 tablet 1  . methotrexate (RHEUMATREX) 2.5 MG tablet Take 10 mg by mouth every Monday.   1  . pantoprazole (PROTONIX) 40 MG tablet Take 1 tablet (40 mg total) by mouth 2 (two) times daily before a meal. 60 tablet 1  . Polyethyl Glycol-Propyl Glycol (SYSTANE ULTRA) 0.4-0.3 % SOLN Place 1 drop into both eyes at bedtime.    . predniSONE (DELTASONE) 5 MG tablet Take 5 mg by mouth daily with breakfast.     . rosuvastatin (CRESTOR) 20 MG tablet Take 20 mg by mouth at bedtime.     . sulfaSALAzine (AZULFIDINE) 500 MG EC tablet Take 1,000 mg by mouth daily.   3  . carvedilol (COREG) 3.125 MG tablet Take 1 tablet (3.125 mg total) by mouth 2 (two) times daily. 180 tablet 3  . nitroGLYCERIN (NITROSTAT) 0.4 MG SL tablet PLACE 1 TABLET (0.4 MG TOTAL) UNDER THE TONGUE EVERY 5 (FIVE) MINUTES AS NEEDED FOR CHEST PAIN. 25 tablet 6   No facility-administered medications prior to visit.      Allergies:   Fish allergy; Ivp dye [iodinated diagnostic agents]; Latex; Shellfish allergy; Shellfish-derived products; Other; and Metformin   Social History   Socioeconomic History  . Marital status: Married    Spouse name: Not on file  . Number of children: Not on file  . Years of education: Not on file  . Highest education level: Not on file  Occupational History  . Not on file  Social Needs  . Financial resource strain: Not on file  . Food insecurity:    Worry: Not on file    Inability: Not on file  . Transportation needs:    Medical: Not on file    Non-medical: Not on file  Tobacco Use    . Smoking status: Never Smoker  . Smokeless tobacco: Never Used  Substance and  Sexual Activity  . Alcohol use: No    Comment: rare glass of wine   . Drug use: No  . Sexual activity: Not on file  Lifestyle  . Physical activity:    Days per week: Not on file    Minutes per session: Not on file  . Stress: Not on file  Relationships  . Social connections:    Talks on phone: Not on file    Gets together: Not on file    Attends religious service: Not on file    Active member of club or organization: Not on file    Attends meetings of clubs or organizations: Not on file    Relationship status: Not on file  Other Topics Concern  . Not on file  Social History Narrative    She is a married mother of 8, grandmother of 59, great grandmother of 85.    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    She does not really get a lot of exercise,    She does not smoke or drink EtOH.     Family History:  The patient's family history includes Hyperlipidemia in her father and mother; Hypertension in her father and mother.   ROS:   Please see the history of present illness.    ROS All other systems reviewed and are negative.   PHYSICAL EXAM:   VS:  BP 136/70   Pulse 70   Resp 16   Ht 5\' 3"  (1.6 m)   Wt 160 lb (72.6 kg)   SpO2 98%   BMI 28.34 kg/m    GEN: Well nourished, well developed, in no acute distress  HEENT: normal  Neck: no JVD, carotid bruits, or masses Cardiac: RRR; no murmurs, rubs, or gallops. 2+ LE edema  Respiratory:  clear to auscultation bilaterally, normal work of breathing GI: soft, nontender, nondistended, + BS MS: no deformity or atrophy  Skin: warm and dry, no rash Neuro:  Alert and Oriented x 3, Strength and sensation are intact Psych: euthymic mood, full affect  Wt Readings from Last 3 Encounters:  11/02/18 160 lb (72.6 kg)  10/12/18 160 lb (72.6 kg)  10/03/18 160 lb (72.6 kg)      Studies/Labs Reviewed:   EKG:  EKG is not ordered today.     Recent Labs: 10/12/2018: ALT 17 10/13/2018: BUN 11; Creatinine, Ser 0.85; Potassium 3.7; Sodium 140 10/15/2018: Hemoglobin 8.8; Platelets 185   Lipid Panel    Component Value Date/Time   CHOL 138 05/19/2017 1254   TRIG 55 05/19/2017 1254   HDL 54 05/19/2017 1254   CHOLHDL 2.6 05/19/2017 1254   CHOLHDL 3.6 11/02/2016 0640   VLDL 11 11/02/2016 0640   LDLCALC 73 05/19/2017 1254    Additional studies/ records that were reviewed today include:   Echo 09/14/2018 LV EF: 65% -   70%  Study Conclusions  - Left ventricle: The cavity size was normal. Systolic function was   vigorous. The estimated ejection fraction was in the range of 65%   to 70%. Wall motion was normal; there were no regional wall   motion abnormalities. Doppler parameters are consistent with   abnormal left ventricular relaxation (grade 1 diastolic   dysfunction). Doppler parameters are consistent with   indeterminate ventricular filling pressure. - Aortic valve: Transvalvular velocity was within the normal range.   There was no stenosis. There was no regurgitation. - Mitral valve: Transvalvular velocity was within the normal range.   There  was no evidence for stenosis. There was trivial   regurgitation. - Left atrium: The atrium was severely dilated. - Right ventricle: The cavity size was normal. Wall thickness was   normal. Systolic function was normal. - Tricuspid valve: There was mild regurgitation. - Pulmonary arteries: Systolic pressure was within the normal   range. PA peak pressure: 25 mm Hg (S).    Myoview 09/15/2018 Study Highlights     The left ventricular ejection fraction is mildly decreased (45-54%).  Nuclear stress EF: 52%.  No T wave inversion was noted during stress.  There was no ST segment deviation noted during stress.  This is a low risk study.   Normal perfusion. LVEF 52% with normal wall motion. This is a low risk study.     ASSESSMENT:    1. Acute on chronic  diastolic heart failure ([place])   2. Coronary artery disease involving coronary bypass graft of native heart without angina pectoris   3. Bilateral carotid artery stenosis   4. Essential hypertension   5. Hyperlipidemia, unspecified hyperlipidemia type   6. Controlled type 2 diabetes mellitus without complication, without long-term current use of insulin (HCC)      PLAN:  In order of problems listed above:  1. Acute on chronic diastolic heart failure: Likely related to IV hydration after the recent hip surgery.  I instructed the patient to double up on her diuretic for 3 days.  She will also need to eat more potassium rich food during the next 3 days as well.  2. CAD s/p CABG: Recent Myoview prior to the surgery was low risk  3. Hypertension: Blood pressure stable  4. Hyperlipidemia: On Zetia and Crestor  5. DM2: Managed by primary care provider  6. PAD: Continue Plavix    Medication Adjustments/Labs and Tests Ordered: Current medicines are reviewed at length with the patient today.  Concerns regarding medicines are outlined above.  Medication changes, Labs and Tests ordered today are listed in the Patient Instructions below. Patient Instructions  Medication Instructions:  DOUBLE your Lasix to 60mg  (3 tablets of 20mg ) for 3 days then go back to regular 30mg  dosage of Lasix once a day  If you need a refill on your cardiac medications before your next appointment, please call your pharmacy.   Lab work: None  If you have labs (blood work) drawn today and your tests are completely normal, you will receive your results only by: Marland Kitchen MyChart Message (if you have MyChart) OR . A paper copy in the mail If you have any lab test that is abnormal or we need to change your treatment, we will call you to review the results.  Testing/Procedures: None   Follow-Up: At [facility], you and your health needs are our priority.  As part of our continuing mission to provide you with exceptional  heart care, we have created designated Provider Care Teams.  These Care Teams include your primary Cardiologist (physician) and Advanced Practice Providers (APPs -  Physician Assistants and Nurse Practitioners) who all work together to provide you with the care you need, when you need it. . Your physician recommends that you schedule a follow-up appointment in: 4 weeks with Dr Ellyn Hack or with Isaac Laud on a day Dr Ellyn Hack is in the office  Any Other Special Instructions Will Be Listed Below (If Applicable).      Hilbert Corrigan, [place]  11/04/2018 11:48 PM    [facility] [address], [place], [place]  [address]  Phone: [phone]; Fax: [phone]

## 2018-11-02 NOTE — Patient Instructions (Signed)
Medication Instructions:  DOUBLE your Lasix to 60mg  (3 tablets of 20mg ) for 3 days then go back to regular 30mg  dosage of Lasix once a day  If you need a refill on your cardiac medications before your next appointment, please call your pharmacy.   Lab work: None  If you have labs (blood work) drawn today and your tests are completely normal, you will receive your results only by: Marland Kitchen MyChart Message (if you have MyChart) OR . A paper copy in the mail If you have any lab test that is abnormal or we need to change your treatment, we will call you to review the results.  Testing/Procedures: None   Follow-Up: At [facility], you and your health needs are our priority.  As part of our continuing mission to provide you with exceptional heart care, we have created designated Provider Care Teams.  These Care Teams include your primary Cardiologist (physician) and Advanced Practice Providers (APPs -  Physician Assistants and Nurse Practitioners) who all work together to provide you with the care you need, when you need it. . Your physician recommends that you schedule a follow-up appointment in: 4 weeks with Dr [name] or with [name] on a day Dr [name] is in the office  Any Other Special Instructions Will Be Listed Below (If Applicable).

## 2018-11-03 DIAGNOSIS — I110 Hypertensive heart disease with heart failure: Secondary | ICD-10-CM | POA: Diagnosis not present

## 2018-11-03 DIAGNOSIS — Z471 Aftercare following joint replacement surgery: Secondary | ICD-10-CM | POA: Diagnosis not present

## 2018-11-03 DIAGNOSIS — K219 Gastro-esophageal reflux disease without esophagitis: Secondary | ICD-10-CM | POA: Diagnosis not present

## 2018-11-03 DIAGNOSIS — I2510 Atherosclerotic heart disease of native coronary artery without angina pectoris: Secondary | ICD-10-CM | POA: Diagnosis not present

## 2018-11-03 DIAGNOSIS — I5032 Chronic diastolic (congestive) heart failure: Secondary | ICD-10-CM | POA: Diagnosis not present

## 2018-11-03 DIAGNOSIS — E1151 Type 2 diabetes mellitus with diabetic peripheral angiopathy without gangrene: Secondary | ICD-10-CM | POA: Diagnosis not present

## 2018-11-04 ENCOUNTER — Encounter: Payer: Self-pay | Admitting: Physician Assistant

## 2018-11-06 DIAGNOSIS — I110 Hypertensive heart disease with heart failure: Secondary | ICD-10-CM | POA: Diagnosis not present

## 2018-11-06 DIAGNOSIS — E1151 Type 2 diabetes mellitus with diabetic peripheral angiopathy without gangrene: Secondary | ICD-10-CM | POA: Diagnosis not present

## 2018-11-06 DIAGNOSIS — I5032 Chronic diastolic (congestive) heart failure: Secondary | ICD-10-CM | POA: Diagnosis not present

## 2018-11-06 DIAGNOSIS — Z471 Aftercare following joint replacement surgery: Secondary | ICD-10-CM | POA: Diagnosis not present

## 2018-11-06 DIAGNOSIS — K219 Gastro-esophageal reflux disease without esophagitis: Secondary | ICD-10-CM | POA: Diagnosis not present

## 2018-11-06 DIAGNOSIS — I2510 Atherosclerotic heart disease of native coronary artery without angina pectoris: Secondary | ICD-10-CM | POA: Diagnosis not present

## 2018-11-07 ENCOUNTER — Other Ambulatory Visit: Payer: Self-pay | Admitting: Orthopaedic Surgery

## 2018-11-08 DIAGNOSIS — I2510 Atherosclerotic heart disease of native coronary artery without angina pectoris: Secondary | ICD-10-CM | POA: Diagnosis not present

## 2018-11-08 DIAGNOSIS — K219 Gastro-esophageal reflux disease without esophagitis: Secondary | ICD-10-CM | POA: Diagnosis not present

## 2018-11-08 DIAGNOSIS — E1151 Type 2 diabetes mellitus with diabetic peripheral angiopathy without gangrene: Secondary | ICD-10-CM | POA: Diagnosis not present

## 2018-11-08 DIAGNOSIS — Z471 Aftercare following joint replacement surgery: Secondary | ICD-10-CM | POA: Diagnosis not present

## 2018-11-08 DIAGNOSIS — I110 Hypertensive heart disease with heart failure: Secondary | ICD-10-CM | POA: Diagnosis not present

## 2018-11-08 DIAGNOSIS — I5032 Chronic diastolic (congestive) heart failure: Secondary | ICD-10-CM | POA: Diagnosis not present

## 2018-11-15 ENCOUNTER — Encounter (INDEPENDENT_AMBULATORY_CARE_PROVIDER_SITE_OTHER): Payer: Self-pay | Admitting: Orthopaedic Surgery

## 2018-11-15 ENCOUNTER — Ambulatory Visit (INDEPENDENT_AMBULATORY_CARE_PROVIDER_SITE_OTHER): Payer: Medicare Other | Admitting: Orthopaedic Surgery

## 2018-11-15 ENCOUNTER — Other Ambulatory Visit (INDEPENDENT_AMBULATORY_CARE_PROVIDER_SITE_OTHER): Payer: Self-pay

## 2018-11-15 DIAGNOSIS — Z96641 Presence of right artificial hip joint: Secondary | ICD-10-CM

## 2018-11-15 NOTE — Progress Notes (Signed)
The patient is now 4 weeks status post a right total hip arthroplasty.  She is 84 years old.  She says the hip incision is doing well but she is still having some pain around her hip.  She is very slow to mobilize and has been using a walker.  Her biggest complaint is just weakness in that hip.  On exam her incision looks great.  She is showing signs of hip weakness and some deconditioning.  She would like to be able to drive and get rid of her walker.  I feel that she is a perfect candidate for outpatient physical therapy to work on right hip strengthening as well as bilateral lower extremity strengthening and balance and coordination as well as conditioning.  We will work on setting this up to [employer].  We will then see her back in 4 weeks to see how she is doing overall.  All question concerns were answered and addressed.

## 2018-11-17 ENCOUNTER — Telehealth (INDEPENDENT_AMBULATORY_CARE_PROVIDER_SITE_OTHER): Payer: Self-pay | Admitting: Orthopaedic Surgery

## 2018-11-17 MED ORDER — HYDROCODONE-ACETAMINOPHEN 5-325 MG PO TABS: 1.0000 | ORAL_TABLET | ORAL | 0 refills | Status: DC | PRN

## 2018-11-17 NOTE — Telephone Encounter (Signed)
I sent some in to her pharmacy.

## 2018-11-17 NOTE — Telephone Encounter (Signed)
Patient called requesting refill Hydrocodone 5-325 mg.  Please call patient to advise.

## 2018-11-17 NOTE — Telephone Encounter (Signed)
Please advise 

## 2018-11-20 NOTE — Telephone Encounter (Signed)
Patient aware.

## 2018-11-21 DIAGNOSIS — R202 Paresthesia of skin: Secondary | ICD-10-CM | POA: Diagnosis not present

## 2018-11-21 DIAGNOSIS — M79643 Pain in unspecified hand: Secondary | ICD-10-CM | POA: Diagnosis not present

## 2018-11-21 DIAGNOSIS — Z13820 Encounter for screening for osteoporosis: Secondary | ICD-10-CM | POA: Diagnosis not present

## 2018-11-21 DIAGNOSIS — M1990 Unspecified osteoarthritis, unspecified site: Secondary | ICD-10-CM | POA: Diagnosis not present

## 2018-11-21 DIAGNOSIS — M25551 Pain in right hip: Secondary | ICD-10-CM | POA: Diagnosis not present

## 2018-11-21 DIAGNOSIS — Z79899 Other long term (current) drug therapy: Secondary | ICD-10-CM | POA: Diagnosis not present

## 2018-11-21 DIAGNOSIS — R7611 Nonspecific reaction to tuberculin skin test without active tuberculosis: Secondary | ICD-10-CM | POA: Diagnosis not present

## 2018-11-21 DIAGNOSIS — M0609 Rheumatoid arthritis without rheumatoid factor, multiple sites: Secondary | ICD-10-CM | POA: Diagnosis not present

## 2018-11-21 DIAGNOSIS — E1169 Type 2 diabetes mellitus with other specified complication: Secondary | ICD-10-CM | POA: Diagnosis not present

## 2018-11-22 ENCOUNTER — Encounter: Payer: Self-pay | Admitting: Physical Therapy

## 2018-11-22 ENCOUNTER — Ambulatory Visit: Payer: Medicare Other | Attending: Orthopaedic Surgery | Admitting: Physical Therapy

## 2018-11-22 ENCOUNTER — Other Ambulatory Visit: Payer: Self-pay

## 2018-11-22 DIAGNOSIS — M25551 Pain in right hip: Secondary | ICD-10-CM | POA: Diagnosis not present

## 2018-11-22 DIAGNOSIS — M25651 Stiffness of right hip, not elsewhere classified: Secondary | ICD-10-CM | POA: Diagnosis not present

## 2018-11-22 DIAGNOSIS — R2689 Other abnormalities of gait and mobility: Secondary | ICD-10-CM | POA: Insufficient documentation

## 2018-11-22 NOTE — Therapy (Signed)
[place], [name], [address] Phone: [phone]   Fax:  [phone]  Physical Therapy Evaluation  Patient Details  Name: [name] MRN: [id number] Date of Birth: [birth year] Referring Provider (PT): Dr Jean Rosenthal    Encounter Date: 11/22/2018  PT End of Session - 11/22/18 1155    Visit Number  1    Number of Visits  16    Date for PT Re-Evaluation  01/17/19    Activity Tolerance  Patient tolerated treatment well    Behavior During Therapy  [facility] for tasks assessed/performed       Past Medical History:  Diagnosis Date  . Atherosclerotic heart disease native coronary artery w/angina pectoris ([place]) 1992   a. 1992 s/p PTCA of LAD;  b. 1998 CABG x 3; c. 07/2001 Cath: Sev LM/LAD/LCX dzs, 3/3 patent grafts; d. 11/2013 Cath: stable graft anatomy; e. 10/2016 Cath: native 3VD, VG->OM nl, LIMA->LAD nl, VG->Diag 55.  . Bradycardia, mild briefly to the 40s, asymptomatic 05/16/2013  . Carotid arterial disease ([place])    a. 05/2014 Carotid U/S: No signif bilat ICA stenosis, >60% L ECA.  Marland Kitchen Chronic anemia   . Chronic diastolic CHF (congestive heart failure) ([place])    a. 10/2016 Echo: Ef 55-60%, no rwma, Gr1 DD, triv AI, PASP 44mmHg, atrial septal aneurysm.  . Chronic diastolic CHF (congestive heart failure), NYHA class 2 ([place]) 08/11/2017  . DM (diabetes mellitus), type 2 with peripheral vascular complications (H[facility])    On Oral medications.  . Essential hypertension   . GERD (gastroesophageal reflux disease)   . Hyperlipidemia with target LDL less than 70   . PAD (peripheral artery disease) ([place]) 05/2013; 09/2013   a. severe calcified POP-1 & TP Trunk Dz bilat;  b . 05/2013 Diamondback Rot Athrectomy (R POP) --> PTA R Pop, DES to R Peroneal;  c. 09/2013 PTA/Stenting of L Pop [name] - retrograde access);  d. 04/2014 ABI's: R/L: 1.1/1.1.  Marland Kitchen Peptic ulcer disease   . S/P CABG x 3 1998   LIMA-LAD, SVG-OM, SVG-D1  .  Severe claudication ([place]) 05/2013   Referred for Peripheral Angio (Dr. Gwenlyn Found --> Dr. Brunetta Jeans in [place])    Past Surgical History:  Procedure Laterality Date  . ATHERECTOMY Right 05/15/2013   Procedure: ATHERECTOMY;  Surgeon: Lorretta Harp, MD;  Location: [facility] CATH LAB;  Service: Cardiovascular;  Laterality: Right;  popliteal  . BACK SURGERY    . CARDIAC CATHETERIZATION  07/17/01   2 v CAD with LM, circ, LAD, obtuse diag, mod stenosis of diag vein graft , mild stenosis of marg vein graft, nl EF, medical treatment  . CARDIAC CATHETERIZATION  11/2013   Widely patent LIMA-LAD & SVG-OM with mild progression of proximal stenosis ~40-50% in SVG-D1; Widely patent native RCA with known severe native LCA disease  . CARDIAC CATHETERIZATION N/A 11/01/2016   Procedure: Left Heart Cath and Cors/Grafts Angiography;  Surgeon: [name], MD;  Location: [facility] CV LAB;  Service: Cardiovascular: Hemodynamics: High LVEDP!.  Cors/Grafts: LM 55%, o-p LAD 100% then after D1 -> LIMA-LAD patent, SVG-Diag ~55%. small RI wiht ~70% ostial. O-p Cx 80% - pCx 70% --> SVG-bifurcating OM patent. -- med Rx  . CORONARY ANGIOPLASTY  1992   PCI to LAD  . CORONARY ARTERY BYPASS GRAFT  1998   LIMA-LAD, SVG-diagonal (none roughly 50% stenosis), SVG-OM  . LEFT HEART CATHETERIZATION WITH CORONARY ANGIOGRAM N/A 11/27/2013   Procedure: LEFT HEART CATHETERIZATION WITH CORONARY  Cyril Loosen;  Surgeon: [name], MD;  Location: [facility] CATH LAB;  Service: Cardiovascular;  Laterality: N/A;  . LOW EXTREMITY DOPPLERS/ABI  10/2016   Patent right SFA, popliteal and peroneal tendons. Patent left SFA and popliteal stent. 30-50% stenosis in the right common femoral and popliteal arteries, 50-74% stenosis in left profunda femoris artery. --> 1 year follow-up.  RABI - 1.2, LABI 1.1  . LOWER EXTREMITY ANGIOGRAM N/A 02/22/2013   Procedure: LOWER EXTREMITY ANGIOGRAM;  Surgeon: Lorretta Harp, MD;  Location: [facility] CATH LAB;  Service:  Cardiovascular;  Laterality: N/A;  . LOWER EXTREMITY ANGIOGRAM N/A 07/20/2013   Procedure: LOWER EXTREMITY ANGIOGRAM;  Surgeon: Lorretta Harp, MD;  Location: [facility] CATH LAB;  Service: Cardiovascular;  Laterality: N/A;  . NM MYOVIEW LTD  04/03/2013; 5/26'/2016   Lexiscan: a)  Apical perfusion defect with no ischemia. Consider prior infarct versus breast attenuation.;; b) 5/'16: LOW RISK, Nl EF ~55-65%, No Ischemia /Infarction  . NM MYOVIEW LTD  08/12/2017   Multiple low RISK. Normal study. No EKG changes. 1.5 mm T wave inversions noted in V2-V4 that began 1 minute into stress. -Ended 3 minutes into rest.  EF 55-65%  . PERCUTANEOUS STENT INTERVENTION Right 05/15/2013   Procedure: PERCUTANEOUS STENT INTERVENTION;  Surgeon: Lorretta Harp, MD;  Location: [facility] CATH LAB;  Service: Cardiovascular;  Laterality: Right;  tibioperoneal trunk  . Peroneal Artery Stent  05/15/13   Diamondback rot. atherectomy of high grade segmental popliteal stenosis then Chocolate balloonPTA; then angiosculpt PTA of the prox peroneal with stent-Xpedition   . pv angio  07/20/13   high-grade calcified popliteal disease with one-vessel runoff via a small anterior tibial artery that has proximal disease as well, no intervention  . TOTAL HIP ARTHROPLASTY Right 10/12/2018   Procedure: RIGHT TOTAL HIP ARTHROPLASTY ANTERIOR APPROACH;  Surgeon: Mcarthur Rossetti, MD;  Location: [facility] ORS;  Service: Orthopedics;  Laterality: Right;  . TRANSTHORACIC ECHOCARDIOGRAM  10/2016   EF 55-60%. Gr 1 DD. Normal PAP. Aortic Sclerosis.  . TRANSTHORACIC ECHOCARDIOGRAM  02/2011; 04/2015   a) EF 50-55%, mild inf HK;; b)  Nl LV Size & Fxn EF 60-65%. mild LVH, Gr 1 DD.  . TUBAL LIGATION      There were no vitals filed for this visit.   Subjective Assessment - 11/22/18 1022    Subjective  Patient had a right anterior total hip replacement on 10/12/2018. She went to rehab for several weeks and then a few weeks of home health. She reports it is still sore  to walk on. She is having difficultygetting in and out of her bathtub at this time.     Limitations  Standing;Walking    How long can you stand comfortably?  Patient can stand for about 10-15 minutes     How long can you walk comfortably?  can walk limited distances around the clinic     Diagnostic tests  Nothing post-op     Patient Stated Goals  to be the best she can with her mobility     Currently in Pain?  Yes    Pain Score  3     Pain Location  Hip    Pain Orientation  Right    Pain Descriptors / Indicators  Aching    Pain Type  Surgical pain    Pain Onset  More than a month ago    Pain Frequency  Constant    Aggravating Factors   standing and walking     Pain Relieving  Factors  rest     Effect of Pain on Daily Activities  difficulty perfroming ADL's          [facility] PT Assessment - 11/22/18 0001      Assessment   Medical Diagnosis  Right Anterior hip replacement     Referring Provider (PT)  Dr Jean Rosenthal     Onset Date/Surgical Date  10/12/18    Hand Dominance  Right    Next MD Visit  12/18/2017     Prior Therapy  None       Precautions   Precautions  Anterior Hip      Restrictions   Weight Bearing Restrictions  No      Balance Screen   Has the patient fallen in the past 6 months  No    Has the patient had a decrease in activity level because of a fear of falling?   No    Is the patient reluctant to leave their home because of a fear of falling?   No      Home Environment   Living Environment  Private residence    Living Arrangements  Spouse/significant other    Type of [place] entrance    Additional Comments  Has a tub. She has a chair in her tub       Prior Function   Level of Independence  Independent   had began to use a cane    Vocation  Retired    Leisure  was taking care of her husband       Cognition   Overall Cognitive Status  Within Functional Limits for tasks assessed    Attention  Focused    Focused Attention   Appears intact    Memory  Appears intact    Awareness  Appears intact    Problem Solving  Appears intact      Sensation   Light Touch  Appears Intact    Additional Comments  pain radiates from her left hip into her quad       Coordination   Gross Motor Movements are Fluid and Coordinated  Yes    Fine Motor Movements are Fluid and Coordinated  Yes      Posture/Postural Control   Posture Comments  forward trunk flexion in standing with the walker       ROM / Strength   AROM / PROM / Strength  PROM;AROM;Strength      AROM   Overall AROM Comments  limited right active hip flexion in standing and sitting       PROM   PROM Assessment Site  Hip    Right/Left Hip  Right;Left      Strength   Strength Assessment Site  Hip    Right/Left Hip  Right;Left    Right Hip Flexion  3/5    Right Hip ABduction  3/5    Right Hip ADduction  3/5    Left Hip Flexion  4+/5    Left Hip ABduction  4+/5    Left Hip ADduction  4+/5      Right Hip   Right Hip Flexion  50      Palpation   Palpation comment  No unexpected tenderness to palpation       Transfers   Comments  slow transfer from sit to stand using bilateral upper extrmeitys       Ambulation/Gait   Ambulation/Gait  Yes    Ambulation  Distance (Feet)  20 Feet    Assistive device  Rolling walker    Gait Pattern  Step-through pattern;Decreased hip/knee flexion - right;Decreased stance time - right                Objective measurements completed on examination: See above findings.              PT Education - 11/22/18 1154    Education Details  reviewed HEP and symptom mangement;     Person(s) Educated  Patient    Methods  Explanation;Demonstration;Verbal cues;Tactile cues    Comprehension  Verbalized understanding;Returned demonstration;Verbal cues required;Tactile cues required       PT Short Term Goals - 11/22/18 1309      PT SHORT TERM GOAL #1   Title  Patient will increase passive right hip flexion by  20 degrees     Time  4    Period  Weeks    Status  New    Target Date  12/20/18      PT SHORT TERM GOAL #2   Title  Patient will increase gross right hip flexion to 4/5     Time  4    Period  Weeks    Status  New    Target Date  12/20/18      PT SHORT TERM GOAL #3   Title  Patient will ambualte 300' with a single point cane     Time  4    Period  Weeks    Status  New    Target Date  12/20/18        PT Long Term Goals - 11/22/18 1311      PT LONG TERM GOAL #1   Title  Patient will stand for 1/2 hour ewithout self report of pain in order to perfrom ADL's     Time  8    Period  Weeks    Status  New    Target Date  01/17/19      PT LONG TERM GOAL #2   Title  Patient will ambualte 3000' with LRAD without self report of pain in order to perfrom ADL's     Time  8    Period  Weeks    Status  New    Target Date  01/17/19      PT LONG TERM GOAL #3   Title  Patient will demonstrate a 49% limitation on FOTO     Time  8    Period  Weeks    Status  New    Target Date  01/17/19             Plan - 11/22/18 1302    Clinical Impression Statement  Patient is an 83 year old female S/P right hip replacement on 10/12/2018. She presents with expected limitations in right hip flexion and and strength. She has an antalgic gait. She has swelling in her anterior hip. She would benefit from skilled therapy to address the above defcitis.     History and Personal Factors relevant to plan of care:  CHF, Rhematoid arthritis, left hip arthritis     Clinical Presentation  Evolving    Clinical Presentation due to:  swelling and pain in the left hip     Clinical Decision Making  Moderate    PT Frequency  2x / week    PT Duration  8 weeks    PT Treatment/Interventions  ADLs/Self Care Home Management;Cryotherapy;Electrical Stimulation;Iontophoresis 4mg /ml Dexamethasone;Ultrasound;DME Instruction;Moist Heat;Gait  training;Stair training;Therapeutic activities;Therapeutic exercise;Neuromuscular  re-education;Patient/family education;Manual techniques;Passive range of motion;Taping    Consulted and Agree with Plan of Care  Patient       Patient will benefit from skilled therapeutic intervention in order to improve the following deficits and impairments:  Abnormal gait, Pain, Postural dysfunction, Decreased activity tolerance, Decreased endurance, Decreased range of motion, Decreased strength, Impaired perceived functional ability, Difficulty walking  Visit Diagnosis: Pain in right hip  Stiffness of right hip, not elsewhere classified  Other abnormalities of gait and mobility     Problem List Patient Active Problem List   Diagnosis Date Noted  . Status post total replacement of right hip 10/12/2018  . Unilateral primary osteoarthritis, right hip 09/12/2018  . Degenerative joint disease 05/30/2018  . Anal fissure 11/10/2017  . Rectal bleeding 11/10/2017  . Rectal pain 11/10/2017  . Inflammatory arthritis 10/13/2017  . Latent tuberculosis by blood test 10/13/2017  . Chest pain with high risk for cardiac etiology 08/11/2017  . Chronic diastolic CHF (congestive heart failure), NYHA class 2 ([place]) 08/11/2017  . Fatigue due to treatment 01/09/2017  . NSTEMI (non-ST elevated myocardial infarction) ([place]) 11/01/2016  . Diabetes mellitus ([place]) 10/24/2015  . Non-insulin dependent type 2 diabetes mellitus ([place]) 10/24/2015  . Angina, class I ([place]) 04/22/2015  . DOE (dyspnea on exertion) 04/22/2015  . Leg edema, left 04/22/2015  . Accumulation of fluid in tissues 03/21/2014  . Vertigo, central 01/02/2014  . Vertigo 01/02/2014  . Limb pain- echymosis, pain Lt radial artery after cath 12/11/2013  . Chest pain with low risk for cardiac etiology- patent grafts on cath 11/26/13; ? microvascular ischemia 11/28/2013  . Dyslipidemia, goal LDL below 70 05/17/2013  . Accelerated hypertension with diastolic congestive heart failure, NYHA class 3 ([place]) 05/17/2013  . Claudication ([place])  05/16/2013  . PTA/ Stent Rt popliteal and Rt peroneal artery 05/16/13 05/16/2013  . PAD,severe calcified pop and tibial peroneal trunk disease bilateraly   . ABDOMINAL PAIN RIGHT LOWER QUADRANT 10/07/2010  . CVA-STROKE 03/04/2010  . BARRETT'S ESOPHAGUS 03/04/2010  . FECAL INCONTINENCE 03/04/2010  . DM 01/14/2010  . ANEMIA OF CHRONIC DISEASE 01/14/2010  . Atherosclerotic heart disease of native coronary artery with angina pectoris ([place]) 01/14/2010  . DIVERTICULOSIS OF COLON 01/14/2010  . PERSONAL HISTORY OF COLONIC POLYPS 01/14/2010  . SHELLFISH ALLERGY 01/14/2010    Carney Living PT DPT  11/22/2018, 1:24 PM  [facility] [address] [place], [name], [address] Phone: [phone]   Fax:  [phone]  Name: CHONTEL WARNING MRN: [id number] Date of Birth: November 13, 1933

## 2018-11-23 ENCOUNTER — Other Ambulatory Visit: Payer: Self-pay | Admitting: Orthopaedic Surgery

## 2018-11-28 ENCOUNTER — Ambulatory Visit: Payer: Medicare Other | Admitting: Physical Therapy

## 2018-11-28 DIAGNOSIS — M25551 Pain in right hip: Secondary | ICD-10-CM | POA: Diagnosis not present

## 2018-11-28 DIAGNOSIS — R2689 Other abnormalities of gait and mobility: Secondary | ICD-10-CM | POA: Diagnosis not present

## 2018-11-28 DIAGNOSIS — M25651 Stiffness of right hip, not elsewhere classified: Secondary | ICD-10-CM | POA: Diagnosis not present

## 2018-11-28 NOTE — Therapy (Signed)
[place], [name], [address] Phone: [phone]   Fax:  [phone]  Physical Therapy Treatment  Patient Details  Name: [name] MRN: [id number] Date of Birth: Oct 05, 1933 Referring Provider (PT): Dr Jean Rosenthal    Encounter Date: 11/28/2018  PT End of Session - 11/28/18 1029    Visit Number  2    Number of Visits  16    Date for PT Re-Evaluation  01/17/19    PT Start Time  5993    PT Stop Time  1105    PT Time Calculation (min)  50 min       Past Medical History:  Diagnosis Date  . Atherosclerotic heart disease native coronary artery w/angina pectoris ([place]) 1992   a. 1992 s/p PTCA of LAD;  b. 1998 CABG x 3; c. 07/2001 Cath: Sev LM/LAD/LCX dzs, 3/3 patent grafts; d. 11/2013 Cath: stable graft anatomy; e. 10/2016 Cath: native 3VD, VG->OM nl, LIMA->LAD nl, VG->Diag 55.  . Bradycardia, mild briefly to the 40s, asymptomatic 05/16/2013  . Carotid arterial disease ([place])    a. 05/2014 Carotid U/S: No signif bilat ICA stenosis, >60% L ECA.  Marland Kitchen Chronic anemia   . Chronic diastolic CHF (congestive heart failure) ([place])    a. 10/2016 Echo: Ef 55-60%, no rwma, Gr1 DD, triv AI, PASP 20mmHg, atrial septal aneurysm.  . Chronic diastolic CHF (congestive heart failure), NYHA class 2 ([place]) 08/11/2017  . DM (diabetes mellitus), type 2 with peripheral vascular complications (H[facility])    On Oral medications.  . Essential hypertension   . GERD (gastroesophageal reflux disease)   . Hyperlipidemia with target LDL less than 70   . PAD (peripheral artery disease) ([place]) 05/2013; 09/2013   a. severe calcified POP-1 & TP Trunk Dz bilat;  b . 05/2013 Diamondback Rot Athrectomy (R POP) --> PTA R Pop, DES to R Peroneal;  c. 09/2013 PTA/Stenting of L Pop [name] - retrograde access);  d. 04/2014 ABI's: R/L: 1.1/1.1.  Marland Kitchen Peptic ulcer disease   . S/P CABG x 3 1998   LIMA-LAD, SVG-OM, SVG-D1  . Severe claudication ([place]) 05/2013   Referred for Peripheral Angio (Dr. Gwenlyn Found --> Dr. Brunetta Jeans in [place])    Past Surgical History:  Procedure Laterality Date  . ATHERECTOMY Right 05/15/2013   Procedure: ATHERECTOMY;  Surgeon: Lorretta Harp, MD;  Location: [facility] CATH LAB;  Service: Cardiovascular;  Laterality: Right;  popliteal  . BACK SURGERY    . CARDIAC CATHETERIZATION  07/17/01   2 v CAD with LM, circ, LAD, obtuse diag, mod stenosis of diag vein graft , mild stenosis of marg vein graft, nl EF, medical treatment  . CARDIAC CATHETERIZATION  11/2013   Widely patent LIMA-LAD & SVG-OM with mild progression of proximal stenosis ~40-50% in SVG-D1; Widely patent native RCA with known severe native LCA disease  . CARDIAC CATHETERIZATION N/A 11/01/2016   Procedure: Left Heart Cath and Cors/Grafts Angiography;  Surgeon: [name], MD;  Location: [facility] CV LAB;  Service: Cardiovascular: Hemodynamics: High LVEDP!.  Cors/Grafts: LM 55%, o-p LAD 100% then after D1 -> LIMA-LAD patent, SVG-Diag ~55%. small RI wiht ~70% ostial. O-p Cx 80% - pCx 70% --> SVG-bifurcating OM patent. -- med Rx  . CORONARY ANGIOPLASTY  1992   PCI to LAD  . CORONARY ARTERY BYPASS GRAFT  1998   LIMA-LAD, SVG-diagonal (none roughly 50% stenosis), SVG-OM  . LEFT HEART CATHETERIZATION WITH CORONARY ANGIOGRAM N/A 11/27/2013   Procedure: LEFT  HEART CATHETERIZATION WITH CORONARY ANGIOGRAM;  Surgeon: [name], MD;  Location: [facility] CATH LAB;  Service: Cardiovascular;  Laterality: N/A;  . LOW EXTREMITY DOPPLERS/ABI  10/2016   Patent right SFA, popliteal and peroneal tendons. Patent left SFA and popliteal stent. 30-50% stenosis in the right common femoral and popliteal arteries, 50-74% stenosis in left profunda femoris artery. --> 1 year follow-up.  RABI - 1.2, LABI 1.1  . LOWER EXTREMITY ANGIOGRAM N/A 02/22/2013   Procedure: LOWER EXTREMITY ANGIOGRAM;  Surgeon: Lorretta Harp, MD;  Location: [facility] CATH LAB;  Service: Cardiovascular;  Laterality: N/A;  . LOWER  EXTREMITY ANGIOGRAM N/A 07/20/2013   Procedure: LOWER EXTREMITY ANGIOGRAM;  Surgeon: Lorretta Harp, MD;  Location: [facility] CATH LAB;  Service: Cardiovascular;  Laterality: N/A;  . NM MYOVIEW LTD  04/03/2013; 5/26'/2016   Lexiscan: a)  Apical perfusion defect with no ischemia. Consider prior infarct versus breast attenuation.;; b) 5/'16: LOW RISK, Nl EF ~55-65%, No Ischemia /Infarction  . NM MYOVIEW LTD  08/12/2017   Multiple low RISK. Normal study. No EKG changes. 1.5 mm T wave inversions noted in V2-V4 that began 1 minute into stress. -Ended 3 minutes into rest.  EF 55-65%  . PERCUTANEOUS STENT INTERVENTION Right 05/15/2013   Procedure: PERCUTANEOUS STENT INTERVENTION;  Surgeon: Lorretta Harp, MD;  Location: [facility] CATH LAB;  Service: Cardiovascular;  Laterality: Right;  tibioperoneal trunk  . Peroneal Artery Stent  05/15/13   Diamondback rot. atherectomy of high grade segmental popliteal stenosis then Chocolate balloonPTA; then angiosculpt PTA of the prox peroneal with stent-Xpedition   . pv angio  07/20/13   high-grade calcified popliteal disease with one-vessel runoff via a small anterior tibial artery that has proximal disease as well, no intervention  . TOTAL HIP ARTHROPLASTY Right 10/12/2018   Procedure: RIGHT TOTAL HIP ARTHROPLASTY ANTERIOR APPROACH;  Surgeon: Mcarthur Rossetti, MD;  Location: [facility] ORS;  Service: Orthopedics;  Laterality: Right;  . TRANSTHORACIC ECHOCARDIOGRAM  10/2016   EF 55-60%. Gr 1 DD. Normal PAP. Aortic Sclerosis.  . TRANSTHORACIC ECHOCARDIOGRAM  02/2011; 04/2015   a) EF 50-55%, mild inf HK;; b)  Nl LV Size & Fxn EF 60-65%. mild LVH, Gr 1 DD.  . TUBAL LIGATION      There were no vitals filed for this visit.                    [place] Adult PT Treatment/Exercise - 11/28/18 0001      Exercises   Exercises  Knee/Hip      Knee/Hip Exercises: Stretches   Hip Flexor Stretch Limitations  dangle off edge of mat       Knee/Hip Exercises: Standing   Gait  Training  heel strike, equal step length cues 180ft     Other Standing Knee Exercises  3 way hip bilat at RW, fatigues with WB to RLE      Knee/Hip Exercises: Supine   Heel Slides  10 reps    Hip Adduction Isometric  10 reps    Bridges  10 reps;2 sets    Other Supine Knee/Hip Exercises  clam red band x10 x 2              PT Education - 11/28/18 1054    Education Details  HEP, desensitization    Person(s) Educated  Patient    Methods  Explanation;Handout    Comprehension  Verbalized understanding       PT Short Term Goals - 11/22/18 1309  PT SHORT TERM GOAL #1   Title  Patient will increase passive right hip flexion by 20 degrees     Time  4    Period  Weeks    Status  New    Target Date  12/20/18      PT SHORT TERM GOAL #2   Title  Patient will increase gross right hip flexion to 4/5     Time  4    Period  Weeks    Status  New    Target Date  12/20/18      PT SHORT TERM GOAL #3   Title  Patient will ambualte 300' with a single point cane     Time  4    Period  Weeks    Status  New    Target Date  12/20/18        PT Long Term Goals - 11/22/18 1311      PT LONG TERM GOAL #1   Title  Patient will stand for 1/2 hour ewithout self report of pain in order to perfrom ADL's     Time  8    Period  Weeks    Status  New    Target Date  01/17/19      PT LONG TERM GOAL #2   Title  Patient will ambualte 3000' with LRAD without self report of pain in order to perfrom ADL's     Time  8    Period  Weeks    Status  New    Target Date  01/17/19      PT LONG TERM GOAL #3   Title  Patient will demonstrate a 49% limitation on FOTO     Time  8    Period  Weeks    Status  New    Target Date  01/17/19            Plan - 11/28/18 1037    Clinical Impression Statement  Reviewed HHPT HEP per patient. Added supine/seated gentle hip strengthening. She fatigues quickly. Encouraged desensitization to the hip area at home and demonstration was provided on her arm  using pilllow case verses towel. Instructed her in heel stike and equal step length with gait.     PT Next Visit Plan  review gentle hip strength HEP,     PT Home Exercise Plan  HHPT HEP: standing march, standing hip abduction, side steps at counter, mini squats. [facility] HEP: glut squeeze , bridge, ball squeeze, clam red band     Consulted and Agree with Plan of Care  Patient       Patient will benefit from skilled therapeutic intervention in order to improve the following deficits and impairments:  Abnormal gait, Pain, Postural dysfunction, Decreased activity tolerance, Decreased endurance, Decreased range of motion, Decreased strength, Impaired perceived functional ability, Difficulty walking  Visit Diagnosis: Pain in right hip  Stiffness of right hip, not elsewhere classified  Other abnormalities of gait and mobility     Problem List Patient Active Problem List   Diagnosis Date Noted  . Status post total replacement of right hip 10/12/2018  . Unilateral primary osteoarthritis, right hip 09/12/2018  . Degenerative joint disease 05/30/2018  . Anal fissure 11/10/2017  . Rectal bleeding 11/10/2017  . Rectal pain 11/10/2017  . Inflammatory arthritis 10/13/2017  . Latent tuberculosis by blood test 10/13/2017  . Chest pain with high risk for cardiac etiology 08/11/2017  . Chronic diastolic CHF (congestive heart failure), NYHA class 2 (  [place]) 08/11/2017  . Fatigue due to treatment 01/09/2017  . NSTEMI (non-ST elevated myocardial infarction) ([place]) 11/01/2016  . Diabetes mellitus ([place]) 10/24/2015  . Non-insulin dependent type 2 diabetes mellitus ([place]) 10/24/2015  . Angina, class I ([place]) 04/22/2015  . DOE (dyspnea on exertion) 04/22/2015  . Leg edema, left 04/22/2015  . Accumulation of fluid in tissues 03/21/2014  . Vertigo, central 01/02/2014  . Vertigo 01/02/2014  . Limb pain- echymosis, pain Lt radial artery after cath 12/11/2013  . Chest pain with low risk for cardiac etiology-  patent grafts on cath 11/26/13; ? microvascular ischemia 11/28/2013  . Dyslipidemia, goal LDL below 70 05/17/2013  . Accelerated hypertension with diastolic congestive heart failure, NYHA class 3 ([place]) 05/17/2013  . Claudication ([place]) 05/16/2013  . PTA/ Stent Rt popliteal and Rt peroneal artery 05/16/13 05/16/2013  . PAD,severe calcified pop and tibial peroneal trunk disease bilateraly   . ABDOMINAL PAIN RIGHT LOWER QUADRANT 10/07/2010  . CVA-STROKE 03/04/2010  . BARRETT'S ESOPHAGUS 03/04/2010  . FECAL INCONTINENCE 03/04/2010  . DM 01/14/2010  . ANEMIA OF CHRONIC DISEASE 01/14/2010  . Atherosclerotic heart disease of native coronary artery with angina pectoris ([place]) 01/14/2010  . DIVERTICULOSIS OF COLON 01/14/2010  . PERSONAL HISTORY OF COLONIC POLYPS 01/14/2010  . SHELLFISH ALLERGY 01/14/2010    Dorene Ar, PTA 11/28/2018, 11:01 AM  [place], [name], [address] Phone: [phone]   Fax:  [phone]  Name: [name] MRN: [id number] Date of Birth: Mar 06, 1933

## 2018-11-29 ENCOUNTER — Encounter: Payer: Self-pay | Admitting: Physician Assistant

## 2018-11-29 ENCOUNTER — Ambulatory Visit (INDEPENDENT_AMBULATORY_CARE_PROVIDER_SITE_OTHER): Payer: Medicare Other | Admitting: Physician Assistant

## 2018-11-29 VITALS — BP 130/62 | HR 72 | Ht 63.0 in | Wt 158.6 lb

## 2018-11-29 DIAGNOSIS — I6523 Occlusion and stenosis of bilateral carotid arteries: Secondary | ICD-10-CM

## 2018-11-29 DIAGNOSIS — E785 Hyperlipidemia, unspecified: Secondary | ICD-10-CM

## 2018-11-29 DIAGNOSIS — I5033 Acute on chronic diastolic (congestive) heart failure: Secondary | ICD-10-CM

## 2018-11-29 DIAGNOSIS — E119 Type 2 diabetes mellitus without complications: Secondary | ICD-10-CM | POA: Diagnosis not present

## 2018-11-29 DIAGNOSIS — I10 Essential (primary) hypertension: Secondary | ICD-10-CM | POA: Diagnosis not present

## 2018-11-29 DIAGNOSIS — I25709 Atherosclerosis of coronary artery bypass graft(s), unspecified, with unspecified angina pectoris: Secondary | ICD-10-CM | POA: Diagnosis not present

## 2018-11-29 DIAGNOSIS — Z79899 Other long term (current) drug therapy: Secondary | ICD-10-CM

## 2018-11-29 MED ORDER — FUROSEMIDE 20 MG PO TABS: 40.0000 mg | ORAL_TABLET | ORAL | 3 refills | Status: DC

## 2018-11-29 MED ORDER — ISOSORBIDE MONONITRATE ER 60 MG PO TB24: 90.0000 mg | ORAL_TABLET | Freq: Every day | ORAL | 3 refills | Status: DC

## 2018-11-29 NOTE — Progress Notes (Signed)
Cardiology Office Note    Date:  12/01/2018   ID:  Ananiah, Maciolek [birth year], MRN [id number]  PCP:  Jani Gravel, MD  Cardiologist:  [name]   Chief Complaint  Patient presents with  . Follow-up    seen for [name].     History of Present Illness:  [name] is a 84 y.o. female with PMH of CAD s/p CABG x 3 1998, asymptomatic bradycardia, carotid artery disease, chronic anemia, chronic diastolic heart failure, DM 2, hypertension, hyperlipidemia, PAD, and history of peptic ulcer disease.  She has preserved LV function after the bypass surgery.  She also had distal right SFA PTA and stenting of the right peroneal artery for lifestyle limiting claudication.  Last cardiac catheterization was performed in November 2017 that showed severe three-vessel native disease, patent SVG to OM, patent LIMA to LAD, 50% disease in SVG to diagonal.  She is on the statin and Plavix for her PAD.  She was recently seen by Sunday Spillers in September 2019 for preoperative clearance prior to her hip surgery.  Echocardiogram obtained on 09/14/2018 showed EF 65 to 70%, grade 1 DD, severe LAE, mild TR, PA peak pressure 25 mmHg.  Myoview performed on 09/15/2018 with EF of 52%, no obvious ischemia, overall low risk study.  Patient underwent right total hip surgery and was recently discharged on 10/16/2018.  I saw the patient on 11/02/2018, she appears to be volume overloaded after the right hip surgery.  I suspect it was related to the IV fluid that she received during the surgery.  I instructed the patient to double up her Lasix for 3 days before going back to the previous dose.  Her weight at that time was 160 pounds.  Patient returns today for follow-up, her weight is down 4 2 pounds, she has noticed some improvement after increasing the diuretic for 3 days, however continue to have significant lower extremity edema.  I will permanently increase her Lasix to 40 mg daily.  She also occasionally noticed some  chest discomfort despite a recent negative Myoview, I would increase Imdur to 90 mg daily.   Past Medical History:  Diagnosis Date  . Atherosclerotic heart disease native coronary artery w/angina pectoris ([place]) 1992   a. 1992 s/p PTCA of LAD;  b. 1998 CABG x 3; c. 07/2001 Cath: Sev LM/LAD/LCX dzs, 3/3 patent grafts; d. 11/2013 Cath: stable graft anatomy; e. 10/2016 Cath: native 3VD, VG->OM nl, LIMA->LAD nl, VG->Diag 55.  . Bradycardia, mild briefly to the 40s, asymptomatic 05/16/2013  . Carotid arterial disease ([place])    a. 05/2014 Carotid U/S: No signif bilat ICA stenosis, >60% L ECA.  Marland Kitchen Chronic anemia   . Chronic diastolic CHF (congestive heart failure) ([place])    a. 10/2016 Echo: Ef 55-60%, no rwma, Gr1 DD, triv AI, PASP 108mmHg, atrial septal aneurysm.  . Chronic diastolic CHF (congestive heart failure), NYHA class 2 ([place]) 08/11/2017  . DM (diabetes mellitus), type 2 with peripheral vascular complications ([facility])    On Oral medications.  . Essential hypertension   . GERD (gastroesophageal reflux disease)   . Hyperlipidemia with target LDL less than 70   . PAD (peripheral artery disease) ([place]) 05/2013; 09/2013   a. severe calcified POP-1 & TP Trunk Dz bilat;  b . 05/2013 Diamondback Rot Athrectomy (R POP) --> PTA R Pop, DES to R Peroneal;  c. 09/2013 PTA/Stenting of L Pop [name] - retrograde access);  d. 04/2014 ABI's: R/L: 1.1/1.1.  Marland Kitchen  Peptic ulcer disease   . S/P CABG x 3 1998   LIMA-LAD, SVG-OM, SVG-D1  . Severe claudication ([place]) 05/2013   Referred for Peripheral Angio (Dr. Gwenlyn Found --> Dr. Brunetta Jeans in [place])    Past Surgical History:  Procedure Laterality Date  . ATHERECTOMY Right 05/15/2013   Procedure: ATHERECTOMY;  Surgeon: Lorretta Harp, MD;  Location: [facility] CATH LAB;  Service: Cardiovascular;  Laterality: Right;  popliteal  . BACK SURGERY    . CARDIAC CATHETERIZATION  07/17/01   2 v CAD with LM, circ, LAD, obtuse diag, mod stenosis of diag vein graft , mild stenosis of marg vein graft,  nl EF, medical treatment  . CARDIAC CATHETERIZATION  11/2013   Widely patent LIMA-LAD & SVG-OM with mild progression of proximal stenosis ~40-50% in SVG-D1; Widely patent native RCA with known severe native LCA disease  . CARDIAC CATHETERIZATION N/A 11/01/2016   Procedure: Left Heart Cath and Cors/Grafts Angiography;  Surgeon: [name], MD;  Location: [place] CV LAB;  Service: Cardiovascular: Hemodynamics: High LVEDP!.  Cors/Grafts: LM 55%, o-p LAD 100% then after D1 -> LIMA-LAD patent, SVG-Diag ~55%. small RI wiht ~70% ostial. O-p Cx 80% - pCx 70% --> SVG-bifurcating OM patent. -- med Rx  . CORONARY ANGIOPLASTY  1992   PCI to LAD  . CORONARY ARTERY BYPASS GRAFT  1998   LIMA-LAD, SVG-diagonal (none roughly 50% stenosis), SVG-OM  . LEFT HEART CATHETERIZATION WITH CORONARY ANGIOGRAM N/A 11/27/2013   Procedure: LEFT HEART CATHETERIZATION WITH CORONARY ANGIOGRAM;  Surgeon: [name], MD;  Location: [facility] CATH LAB;  Service: Cardiovascular;  Laterality: N/A;  . LOW EXTREMITY DOPPLERS/ABI  10/2016   Patent right SFA, popliteal and peroneal tendons. Patent left SFA and popliteal stent. 30-50% stenosis in the right common femoral and popliteal arteries, 50-74% stenosis in left profunda femoris artery. --> 1 year follow-up.  RABI - 1.2, LABI 1.1  . LOWER EXTREMITY ANGIOGRAM N/A 02/22/2013   Procedure: LOWER EXTREMITY ANGIOGRAM;  Surgeon: Lorretta Harp, MD;  Location: [facility] CATH LAB;  Service: Cardiovascular;  Laterality: N/A;  . LOWER EXTREMITY ANGIOGRAM N/A 07/20/2013   Procedure: LOWER EXTREMITY ANGIOGRAM;  Surgeon: Lorretta Harp, MD;  Location: [facility] CATH LAB;  Service: Cardiovascular;  Laterality: N/A;  . NM MYOVIEW LTD  04/03/2013; 5/26'/2016   Lexiscan: a)  Apical perfusion defect with no ischemia. Consider prior infarct versus breast attenuation.;; b) 5/'16: LOW RISK, Nl EF ~55-65%, No Ischemia /Infarction  . NM MYOVIEW LTD  08/12/2017   Multiple low RISK. Normal study. No EKG changes.  1.5 mm T wave inversions noted in V2-V4 that began 1 minute into stress. -Ended 3 minutes into rest.  EF 55-65%  . PERCUTANEOUS STENT INTERVENTION Right 05/15/2013   Procedure: PERCUTANEOUS STENT INTERVENTION;  Surgeon: Lorretta Harp, MD;  Location: [facility] CATH LAB;  Service: Cardiovascular;  Laterality: Right;  tibioperoneal trunk  . Peroneal Artery Stent  05/15/13   Diamondback rot. atherectomy of high grade segmental popliteal stenosis then Chocolate balloonPTA; then angiosculpt PTA of the prox peroneal with stent-Xpedition   . pv angio  07/20/13   high-grade calcified popliteal disease with one-vessel runoff via a small anterior tibial artery that has proximal disease as well, no intervention  . TOTAL HIP ARTHROPLASTY Right 10/12/2018   Procedure: RIGHT TOTAL HIP ARTHROPLASTY ANTERIOR APPROACH;  Surgeon: Mcarthur Rossetti, MD;  Location: [facility] ORS;  Service: Orthopedics;  Laterality: Right;  . TRANSTHORACIC ECHOCARDIOGRAM  10/2016   EF 55-60%. Gr 1 DD. Normal  PAP. Aortic Sclerosis.  . TRANSTHORACIC ECHOCARDIOGRAM  02/2011; 04/2015   a) EF 50-55%, mild inf HK;; b)  Nl LV Size & Fxn EF 60-65%. mild LVH, Gr 1 DD.  . TUBAL LIGATION      Current Medications: Outpatient Medications Prior to Visit  Medication Sig Dispense Refill  . carvedilol (COREG) 3.125 MG tablet Take 1 tablet (3.125 mg total) by mouth 2 (two) times daily. 180 tablet 3  . clopidogrel (PLAVIX) 75 MG tablet Take 75 mg by mouth daily.    . Coenzyme Q10 (CO Q 10 PO) Take 1 tablet by mouth every other day.     . ezetimibe (ZETIA) 10 MG tablet Take 10 mg by mouth at bedtime.     . folic acid (FOLVITE) 1 MG tablet Take 1 mg by mouth daily.  1  . gabapentin (NEURONTIN) 300 MG capsule Take 1 capsule (300 mg total) by mouth 3 (three) times daily. 30 capsule 5  . glimepiride (AMARYL) 1 MG tablet Take 2 mg by mouth daily with breakfast.   12  . HYDROcodone-acetaminophen (NORCO/VICODIN) 5-325 MG tablet Take 1-2 tablets by mouth every 4 (four)  hours as needed for moderate pain (pain score 4-6). 40 tablet 0  . methocarbamol (ROBAXIN) 500 MG tablet Take 1 tablet (500 mg total) by mouth every 6 (six) hours as needed for muscle spasms. 40 tablet 1  . methotrexate (RHEUMATREX) 2.5 MG tablet Take 10 mg by mouth every Friday.   1  . nitroGLYCERIN (NITROSTAT) 0.4 MG SL tablet PLACE 1 TABLET (0.4 MG TOTAL) UNDER THE TONGUE EVERY 5 (FIVE) MINUTES AS NEEDED FOR CHEST PAIN. 25 tablet 6  . pantoprazole (PROTONIX) 40 MG tablet Take 1 tablet (40 mg total) by mouth 2 (two) times daily before a meal. 60 tablet 1  . Polyethyl Glycol-Propyl Glycol (SYSTANE ULTRA) 0.4-0.3 % SOLN Place 1 drop into both eyes at bedtime.    . predniSONE (DELTASONE) 5 MG tablet Take 5 mg by mouth daily with breakfast.     . rosuvastatin (CRESTOR) 20 MG tablet Take 20 mg by mouth at bedtime.     . sulfaSALAzine (AZULFIDINE) 500 MG EC tablet Take 1,000 mg by mouth daily.   3  . furosemide (LASIX) 20 MG tablet Take 1.5 tablets (30 mg total) by mouth every morning. 30 tablet 1  . isosorbide mononitrate (IMDUR) 60 MG 24 hr tablet TAKE 1 TABLET (60 MG TOTAL) BY MOUTH DAILY. (Patient taking differently: Take 60 mg by mouth daily. ) 30 tablet 9  . LORazepam (ATIVAN) 0.5 MG tablet Take 0.5 mg by mouth every 6 (six) hours as needed for anxiety or sleep.   5  . acetaminophen (TYLENOL) 500 MG tablet Take 1,000 mg by mouth every 6 (six) hours as needed for moderate pain or headache.     No facility-administered medications prior to visit.      Allergies:   Fish allergy; Ivp dye [iodinated diagnostic agents]; Latex; Shellfish allergy; Shellfish-derived products; Other; and Metformin   Social History   Socioeconomic History  . Marital status: Married    Spouse name: Not on file  . Number of children: Not on file  . Years of education: Not on file  . Highest education level: Not on file  Occupational History  . Not on file  Social Needs  . Financial resource strain: Not on file  .  Food insecurity:    Worry: Not on file    Inability: Not on file  . Transportation needs:  Medical: Not on file    Non-medical: Not on file  Tobacco Use  . Smoking status: Never Smoker  . Smokeless tobacco: Never Used  Substance and Sexual Activity  . Alcohol use: No    Comment: rare glass of wine   . Drug use: No  . Sexual activity: Not on file  Lifestyle  . Physical activity:    Days per week: Not on file    Minutes per session: Not on file  . Stress: Not on file  Relationships  . Social connections:    Talks on phone: Not on file    Gets together: Not on file    Attends religious service: Not on file    Active member of club or organization: Not on file    Attends meetings of clubs or organizations: Not on file    Relationship status: Not on file  Other Topics Concern  . Not on file  Social History Narrative    She is a married mother of 62, grandmother of 32, great grandmother of 71.    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    She does not really get a lot of exercise,    She does not smoke or drink EtOH.     Family History:  The patient's family history includes Hyperlipidemia in her father and mother; Hypertension in her father and mother.   ROS:   Please see the history of present illness.    ROS All other systems reviewed and are negative.   PHYSICAL EXAM:   VS:  BP 130/62   Pulse 72   Ht 5\' 3"  (1.6 m)   Wt 158 lb 9.6 oz (71.9 kg)   BMI 28.09 kg/m    GEN: Well nourished, well developed, in no acute distress  HEENT: normal  Neck: no JVD, carotid bruits, or masses Cardiac: RRR; no murmurs, rubs, or gallops,no edema  Respiratory:  clear to auscultation bilaterally, normal work of breathing GI: soft, nontender, nondistended, + BS MS: no deformity or atrophy  Skin: warm and dry, no rash Neuro:  Alert and Oriented x 3, Strength and sensation are intact Psych: euthymic mood, full affect  Wt Readings from Last 3 Encounters:  11/29/18  158 lb 9.6 oz (71.9 kg)  11/02/18 160 lb (72.6 kg)  10/12/18 160 lb (72.6 kg)      Studies/Labs Reviewed:   EKG:  EKG is not ordered today.    Recent Labs: 10/12/2018: ALT 17 10/13/2018: BUN 11; Creatinine, Ser 0.85; Potassium 3.7; Sodium 140 10/15/2018: Hemoglobin 8.8; Platelets 185   Lipid Panel    Component Value Date/Time   CHOL 138 05/19/2017 1254   TRIG 55 05/19/2017 1254   HDL 54 05/19/2017 1254   CHOLHDL 2.6 05/19/2017 1254   CHOLHDL 3.6 11/02/2016 0640   VLDL 11 11/02/2016 0640   LDLCALC 73 05/19/2017 1254    Additional studies/ records that were reviewed today include:   Echo 09/14/2018 LV EF: 65% -   70% Study Conclusions  - Left ventricle: The cavity size was normal. Systolic function was   vigorous. The estimated ejection fraction was in the range of 65%   to 70%. Wall motion was normal; there were no regional wall   motion abnormalities. Doppler parameters are consistent with   abnormal left ventricular relaxation (grade 1 diastolic   dysfunction). Doppler parameters are consistent with   indeterminate ventricular filling pressure. - Aortic valve: Transvalvular velocity was within the normal range.  There was no stenosis. There was no regurgitation. - Mitral valve: Transvalvular velocity was within the normal range.   There was no evidence for stenosis. There was trivial   regurgitation. - Left atrium: The atrium was severely dilated. - Right ventricle: The cavity size was normal. Wall thickness was   normal. Systolic function was normal. - Tricuspid valve: There was mild regurgitation. - Pulmonary arteries: Systolic pressure was within the normal   range. PA peak pressure: 25 mm Hg (S).    Myoview 09/15/2018 Study Highlights     The left ventricular ejection fraction is mildly decreased (45-54%).  Nuclear stress EF: 52%.  No T wave inversion was noted during stress.  There was no ST segment deviation noted during stress.  This is a low  risk study.   Normal perfusion. LVEF 52% with normal wall motion. This is a low risk study.     ASSESSMENT:    1. Acute on chronic diastolic heart failure ([place])   2. Medication management   3. Coronary artery disease involving coronary bypass graft of native heart with angina pectoris ([place])   4. Bilateral carotid artery stenosis   5. Controlled type 2 diabetes mellitus without complication, without long-term current use of insulin ([place])   6. Essential hypertension   7. Hyperlipidemia LDL goal <70      PLAN:  In order of problems listed above:  1. Acute on chronic diastolic heart failure: Increase Lasix to 40 mg daily.  Repeat basic metabolic panel in 1 week  2. CAD: Patient has been having some intermittent chest discomfort recently, it is not associated with exertion.  We will try a trial of medical therapy by increasing Imdur to 90 mg daily  3. Hypertension: Blood pressure stable on current therapy  4. Hyperlipidemia: Continue Crestor 20 mg daily.  On Zetia and co-Q10.  Due for repeat fasting lipid panel.  5. DM2: Managed by primary care provider    Medication Adjustments/Labs and Tests Ordered: Current medicines are reviewed at length with the patient today.  Concerns regarding medicines are outlined above.  Medication changes, Labs and Tests ordered today are listed in the Patient Instructions below. Patient Instructions  .Medication Instructions:  Increase: Lasix 40 mg daily      Imdur 90 mg daily If you need a refill on your cardiac medications before your next appointment, please call your pharmacy.   Lab work: Your physician recommends that you return for lab work on Monday 12/04/18 (BMP, UA)  If you have labs (blood work) drawn today and your tests are completely normal, you will receive your results only by: Marland Kitchen MyChart Message (if you have MyChart) OR . A paper copy in the mail If you have any lab test that is abnormal or we need to change your treatment, we  will call you to review the results.  Testing/Procedures: None  Follow-Up: At [facility], you and your health needs are our priority.  As part of our continuing mission to provide you with exceptional heart care, we have created designated Provider Care Teams.  These Care Teams include your primary Cardiologist (physician) and Advanced Practice Providers (APPs -  Physician Assistants and Nurse Practitioners) who all work together to provide you with the care you need, when you need it. You will need a follow up appointment in 6-8 weeks.  Please call our office 2 months in advance to schedule this appointment.  You may see [name], MD or one of the following Advanced Practice  Providers on your designated Care Team:   [name], PA-C . Jory Sims, DNP, ANP         Signed, [name], [place]  12/01/2018 11:53 PM    [facility] [address], [place], [place]  [address] Phone: [phone]; Fax: [phone]

## 2018-11-29 NOTE — Patient Instructions (Signed)
.  Medication Instructions:  Increase: Lasix 40 mg daily      Imdur 90 mg daily If you need a refill on your cardiac medications before your next appointment, please call your pharmacy.   Lab work: Your physician recommends that you return for lab work on Monday 12/04/18 (BMP, UA)  If you have labs (blood work) drawn today and your tests are completely normal, you will receive your results only by: Marland Kitchen MyChart Message (if you have MyChart) OR . A paper copy in the mail If you have any lab test that is abnormal or we need to change your treatment, we will call you to review the results.  Testing/Procedures: None  Follow-Up: At [facility], you and your health needs are our priority.  As part of our continuing mission to provide you with exceptional heart care, we have created designated Provider Care Teams.  These Care Teams include your primary Cardiologist (physician) and Advanced Practice Providers (APPs -  Physician Assistants and Nurse Practitioners) who all work together to provide you with the care you need, when you need it. You will need a follow up appointment in 6-8 weeks.  Please call our office 2 months in advance to schedule this appointment.  You may see [name], MD or one of the following Advanced Practice Providers on your designated Care Team:   [name], PA-C . Jory Sims, DNP, ANP

## 2018-12-01 ENCOUNTER — Encounter: Payer: Self-pay | Admitting: Physician Assistant

## 2018-12-01 ENCOUNTER — Other Ambulatory Visit: Payer: Self-pay

## 2018-12-01 MED ORDER — ISOSORBIDE MONONITRATE ER 60 MG PO TB24: 90.0000 mg | ORAL_TABLET | Freq: Every day | ORAL | 3 refills | Status: DC

## 2018-12-04 DIAGNOSIS — Z79899 Other long term (current) drug therapy: Secondary | ICD-10-CM | POA: Diagnosis not present

## 2018-12-05 LAB — URINALYSIS
Bilirubin, UA: NEGATIVE
Glucose, UA: NEGATIVE
Ketones, UA: NEGATIVE
Nitrite, UA: NEGATIVE
RBC, UA: NEGATIVE
Specific Gravity, UA: 1.025 (ref 1.005–1.030)
Urobilinogen, Ur: 0.2 mg/dL (ref 0.2–1.0)
pH, UA: 5 (ref 5.0–7.5)

## 2018-12-05 LAB — BASIC METABOLIC PANEL
BUN/Creatinine Ratio: 15 (ref 12–28)
BUN: 13 mg/dL (ref 8–27)
CO2: 25 mmol/L (ref 20–29)
CREATININE: 0.88 mg/dL (ref 0.57–1.00)
Calcium: 9.4 mg/dL (ref 8.7–10.3)
Chloride: 99 mmol/L (ref 96–106)
GFR calc Af Amer: 71 mL/min/{1.73_m2} (ref >59)
GFR calc non Af Amer: 61 mL/min/{1.73_m2} (ref >59)
Glucose: 173 mg/dL — ABNORMAL HIGH (ref 65–99)
Potassium: 3.9 mmol/L (ref 3.5–5.2)
Sodium: 141 mmol/L (ref 134–144)

## 2018-12-05 NOTE — Progress Notes (Signed)
Kidney function stable, mild degree of white blood cell in the urine, but no trace of nitrite, no strong evidence of urinary tract infect, will continue to monitor. Forward lab to PCPs office

## 2018-12-07 ENCOUNTER — Encounter: Payer: Self-pay | Admitting: Physical Therapy

## 2018-12-07 ENCOUNTER — Ambulatory Visit: Payer: Medicare Other | Admitting: Physical Therapy

## 2018-12-07 DIAGNOSIS — R2689 Other abnormalities of gait and mobility: Secondary | ICD-10-CM

## 2018-12-07 DIAGNOSIS — M25551 Pain in right hip: Secondary | ICD-10-CM

## 2018-12-07 DIAGNOSIS — M25651 Stiffness of right hip, not elsewhere classified: Secondary | ICD-10-CM

## 2018-12-07 NOTE — Therapy (Signed)
[place], [name], [address] Phone: [phone]   Fax:  [phone]  Physical Therapy Treatment  Patient Details  Name: [name] MRN: [id number] Date of Birth: May 27, 1933 Referring Provider (PT): Dr Jean Rosenthal    Encounter Date: 12/07/2018  PT End of Session - 12/07/18 1134    Visit Number  3    Number of Visits  16    Date for PT Re-Evaluation  01/17/19    Authorization Type  Medicare triad     PT Start Time  1100    PT Stop Time  1142    PT Time Calculation (min)  42 min    Activity Tolerance  Patient tolerated treatment well    Behavior During Therapy  [facility] for tasks assessed/performed       Past Medical History:  Diagnosis Date  . Atherosclerotic heart disease native coronary artery w/angina pectoris ([place]) 1992   a. 1992 s/p PTCA of LAD;  b. 1998 CABG x 3; c. 07/2001 Cath: Sev LM/LAD/LCX dzs, 3/3 patent grafts; d. 11/2013 Cath: stable graft anatomy; e. 10/2016 Cath: native 3VD, VG->OM nl, LIMA->LAD nl, VG->Diag 55.  . Bradycardia, mild briefly to the 40s, asymptomatic 05/16/2013  . Carotid arterial disease ([place])    a. 05/2014 Carotid U/S: No signif bilat ICA stenosis, >60% L ECA.  Marland Kitchen Chronic anemia   . Chronic diastolic CHF (congestive heart failure) ([place])    a. 10/2016 Echo: Ef 55-60%, no rwma, Gr1 DD, triv AI, PASP 96mmHg, atrial septal aneurysm.  . Chronic diastolic CHF (congestive heart failure), NYHA class 2 ([place]) 08/11/2017  . DM (diabetes mellitus), type 2 with peripheral vascular complications (H[facility])    On Oral medications.  . Essential hypertension   . GERD (gastroesophageal reflux disease)   . Hyperlipidemia with target LDL less than 70   . PAD (peripheral artery disease) ([place]) 05/2013; 09/2013   a. severe calcified POP-1 & TP Trunk Dz bilat;  b . 05/2013 Diamondback Rot Athrectomy (R POP) --> PTA R Pop, DES to R Peroneal;  c. 09/2013 PTA/Stenting of L Pop [name] - retrograde  access);  d. 04/2014 ABI's: R/L: 1.1/1.1.  Marland Kitchen Peptic ulcer disease   . S/P CABG x 3 1998   LIMA-LAD, SVG-OM, SVG-D1  . Severe claudication ([place]) 05/2013   Referred for Peripheral Angio (Dr. Gwenlyn Found --> Dr. Brunetta Jeans in [place])    Past Surgical History:  Procedure Laterality Date  . ATHERECTOMY Right 05/15/2013   Procedure: ATHERECTOMY;  Surgeon: Lorretta Harp, MD;  Location: [facility] CATH LAB;  Service: Cardiovascular;  Laterality: Right;  popliteal  . BACK SURGERY    . CARDIAC CATHETERIZATION  07/17/01   2 v CAD with LM, circ, LAD, obtuse diag, mod stenosis of diag vein graft , mild stenosis of marg vein graft, nl EF, medical treatment  . CARDIAC CATHETERIZATION  11/2013   Widely patent LIMA-LAD & SVG-OM with mild progression of proximal stenosis ~40-50% in SVG-D1; Widely patent native RCA with known severe native LCA disease  . CARDIAC CATHETERIZATION N/A 11/01/2016   Procedure: Left Heart Cath and Cors/Grafts Angiography;  Surgeon: [name], MD;  Location: [facility] CV LAB;  Service: Cardiovascular: Hemodynamics: High LVEDP!.  Cors/Grafts: LM 55%, o-p LAD 100% then after D1 -> LIMA-LAD patent, SVG-Diag ~55%. small RI wiht ~70% ostial. O-p Cx 80% - pCx 70% --> SVG-bifurcating OM patent. -- med Rx  . CORONARY ANGIOPLASTY  1992   PCI to LAD  .  CORONARY ARTERY BYPASS GRAFT  1998   LIMA-LAD, SVG-diagonal (none roughly 50% stenosis), SVG-OM  . LEFT HEART CATHETERIZATION WITH CORONARY ANGIOGRAM N/A 11/27/2013   Procedure: LEFT HEART CATHETERIZATION WITH CORONARY ANGIOGRAM;  Surgeon: [name], MD;  Location: [facility] CATH LAB;  Service: Cardiovascular;  Laterality: N/A;  . LOW EXTREMITY DOPPLERS/ABI  10/2016   Patent right SFA, popliteal and peroneal tendons. Patent left SFA and popliteal stent. 30-50% stenosis in the right common femoral and popliteal arteries, 50-74% stenosis in left profunda femoris artery. --> 1 year follow-up.  RABI - 1.2, LABI 1.1  . LOWER EXTREMITY ANGIOGRAM N/A 02/22/2013    Procedure: LOWER EXTREMITY ANGIOGRAM;  Surgeon: Lorretta Harp, MD;  Location: [facility] CATH LAB;  Service: Cardiovascular;  Laterality: N/A;  . LOWER EXTREMITY ANGIOGRAM N/A 07/20/2013   Procedure: LOWER EXTREMITY ANGIOGRAM;  Surgeon: Lorretta Harp, MD;  Location: [facility] CATH LAB;  Service: Cardiovascular;  Laterality: N/A;  . NM MYOVIEW LTD  04/03/2013; 5/26'/2016   Lexiscan: a)  Apical perfusion defect with no ischemia. Consider prior infarct versus breast attenuation.;; b) 5/'16: LOW RISK, Nl EF ~55-65%, No Ischemia /Infarction  . NM MYOVIEW LTD  08/12/2017   Multiple low RISK. Normal study. No EKG changes. 1.5 mm T wave inversions noted in V2-V4 that began 1 minute into stress. -Ended 3 minutes into rest.  EF 55-65%  . PERCUTANEOUS STENT INTERVENTION Right 05/15/2013   Procedure: PERCUTANEOUS STENT INTERVENTION;  Surgeon: Lorretta Harp, MD;  Location: [facility] CATH LAB;  Service: Cardiovascular;  Laterality: Right;  tibioperoneal trunk  . Peroneal Artery Stent  05/15/13   Diamondback rot. atherectomy of high grade segmental popliteal stenosis then Chocolate balloonPTA; then angiosculpt PTA of the prox peroneal with stent-Xpedition   . pv angio  07/20/13   high-grade calcified popliteal disease with one-vessel runoff via a small anterior tibial artery that has proximal disease as well, no intervention  . TOTAL HIP ARTHROPLASTY Right 10/12/2018   Procedure: RIGHT TOTAL HIP ARTHROPLASTY ANTERIOR APPROACH;  Surgeon: Mcarthur Rossetti, MD;  Location: [facility] ORS;  Service: Orthopedics;  Laterality: Right;  . TRANSTHORACIC ECHOCARDIOGRAM  10/2016   EF 55-60%. Gr 1 DD. Normal PAP. Aortic Sclerosis.  . TRANSTHORACIC ECHOCARDIOGRAM  02/2011; 04/2015   a) EF 50-55%, mild inf HK;; b)  Nl LV Size & Fxn EF 60-65%. mild LVH, Gr 1 DD.  . TUBAL LIGATION      There were no vitals filed for this visit.  Subjective Assessment - 12/07/18 1125    Subjective  Patient reports she is more sore today but she was up on her  feet for a long period of time yesterday. She feels like is is sire and swollen. She has not missed nay appointments but     Limitations  Standing;Walking    How long can you stand comfortably?  Patient can stand for about 10-15 minutes     How long can you walk comfortably?  can walk limited distances around the clinic     Currently in Pain?  Yes    Pain Score  5     Pain Location  Hip    Pain Orientation  Right;Anterior    Pain Descriptors / Indicators  Aching    Pain Type  Surgical pain    Pain Onset  More than a month ago    Pain Frequency  Constant    Aggravating Factors   standing and walking     Pain Relieving Factors  rest  Effect of Pain on Daily Activities  difficulty perfroming ADL's                        [facility] Adult PT Treatment/Exercise - 12/07/18 0001      Knee/Hip Exercises: Stretches   Other Knee/Hip Stretches  reviewed self hip flexion stretch wih strap 3x5 with 5 second hold moderat cuing for tehcnique       Knee/Hip Exercises: Aerobic   Nustep  moderate cuing for ROM wihtout increased pain x5 min       Knee/Hip Exercises: Standing   Other Standing Knee Exercises  standing weight shift; exercises not perfromed today 2nd to increased soreness.       Knee/Hip Exercises: Supine   Quad Sets Limitations  2x10 5 sec hold     Hip Adduction Isometric  10 reps    Other Supine Knee/Hip Exercises  clam red band x10 x 2       Manual Therapy   Manual Therapy  Passive ROM    Passive ROM  gentle PROM              PT Education - 12/07/18 1132    Education Details  HEP, symptom mangement ; self desensitization     Person(s) Educated  Patient    Methods  Explanation;Demonstration    Comprehension  Verbalized understanding;Returned demonstration;Verbal cues required;Tactile cues required       PT Short Term Goals - 12/07/18 1439      PT SHORT TERM GOAL #1   Title  Patient will increase passive right hip flexion by 20 degrees     Baseline   per visual inspection improved but still limited     Time  4    Period  Weeks    Status  On-going      PT SHORT TERM GOAL #2   Title  Patient will increase gross right hip flexion to 4/5     Time  4    Period  Weeks    Status  On-going      PT SHORT TERM GOAL #3   Title  Patient will ambualte 25' with a single point cane     Time  4    Period  Weeks    Status  On-going        PT Long Term Goals - 11/22/18 1311      PT LONG TERM GOAL #1   Title  Patient will stand for 1/2 hour ewithout self report of pain in order to perfrom ADL's     Time  8    Period  Weeks    Status  New    Target Date  01/17/19      PT LONG TERM GOAL #2   Title  Patient will ambualte 3000' with LRAD without self report of pain in order to perfrom ADL's     Time  8    Period  Weeks    Status  New    Target Date  01/17/19      PT LONG TERM GOAL #3   Title  Patient will demonstrate a 49% limitation on FOTO     Time  8    Period  Weeks    Status  New    Target Date  01/17/19            Plan - 12/07/18 1135    Clinical Impression Statement  Therapy talked with patient about coming for therapy more  consistenly. It is still  unclear after talking to the patient wether it was a scheduling issue because of the holidays or if it was a trasportation issue but this is just her third visit. She will try to com more consistently from here on out. She continues to have anterior hip sensativity and decreased hip flexion. She was advsed to continue light hip flexion stretching.     Clinical Presentation  Evolving    Clinical Decision Making  Moderate    Rehab Potential  Good    PT Frequency  2x / week    PT Duration  8 weeks    PT Treatment/Interventions  ADLs/Self Care Home Management;Cryotherapy;Electrical Stimulation;Iontophoresis 4mg /ml Dexamethasone;Ultrasound;DME Instruction;Moist Heat;Gait training;Stair training;Therapeutic activities;Therapeutic exercise;Neuromuscular re-education;Patient/family  education;Manual techniques;Passive range of motion;Taping    PT Next Visit Plan  review gentle hip strength HEP, review standing weight shifting, considergait training in the II bars. start standing hip flexion when able.     PT Home Exercise Plan  [facility]PT HEP: standing march, standing hip abduction, side steps at counter, mini squats. [facility] HEP: glut squeeze , bridge, ball squeeze, clam red band     Consulted and Agree with Plan of Care  Patient       Patient will benefit from skilled therapeutic intervention in order to improve the following deficits and impairments:  Abnormal gait, Pain, Postural dysfunction, Decreased activity tolerance, Decreased endurance, Decreased range of motion, Decreased strength, Impaired perceived functional ability, Difficulty walking  Visit Diagnosis: Pain in right hip  Stiffness of right hip, not elsewhere classified  Other abnormalities of gait and mobility     Problem List Patient Active Problem List   Diagnosis Date Noted  . Status post total replacement of right hip 10/12/2018  . Unilateral primary osteoarthritis, right hip 09/12/2018  . Degenerative joint disease 05/30/2018  . Anal fissure 11/10/2017  . Rectal bleeding 11/10/2017  . Rectal pain 11/10/2017  . Inflammatory arthritis 10/13/2017  . Latent tuberculosis by blood test 10/13/2017  . Chest pain with high risk for cardiac etiology 08/11/2017  . Chronic diastolic CHF (congestive heart failure), NYHA class 2 ([place]) 08/11/2017  . Fatigue due to treatment 01/09/2017  . NSTEMI (non-ST elevated myocardial infarction) ([place]) 11/01/2016  . Diabetes mellitus ([place]) 10/24/2015  . Non-insulin dependent type 2 diabetes mellitus ([place]) 10/24/2015  . Angina, class I ([place]) 04/22/2015  . DOE (dyspnea on exertion) 04/22/2015  . Leg edema, left 04/22/2015  . Accumulation of fluid in tissues 03/21/2014  . Vertigo, central 01/02/2014  . Vertigo 01/02/2014  . Limb pain- echymosis, pain Lt radial artery after  cath 12/11/2013  . Chest pain with low risk for cardiac etiology- patent grafts on cath 11/26/13; ? microvascular ischemia 11/28/2013  . Dyslipidemia, goal LDL below 70 05/17/2013  . Accelerated hypertension with diastolic congestive heart failure, NYHA class 3 ([place]) 05/17/2013  . Claudication ([place]) 05/16/2013  . PTA/ Stent Rt popliteal and Rt peroneal artery 05/16/13 05/16/2013  . PAD,severe calcified pop and tibial peroneal trunk disease bilateraly   . ABDOMINAL PAIN RIGHT LOWER QUADRANT 10/07/2010  . CVA-STROKE 03/04/2010  . BARRETT'S ESOPHAGUS 03/04/2010  . FECAL INCONTINENCE 03/04/2010  . DM 01/14/2010  . ANEMIA OF CHRONIC DISEASE 01/14/2010  . Atherosclerotic heart disease of native coronary artery with angina pectoris ([place]) 01/14/2010  . DIVERTICULOSIS OF COLON 01/14/2010  . PERSONAL HISTORY OF COLONIC POLYPS 01/14/2010  . SHELLFISH ALLERGY 01/14/2010    Carney Living PT DPT 12/07/2018, 3:10 PM  [facility]  [place], [name], [address] Phone: [phone]   Fax:  [phone]  Name: JAXIE RACANELLI MRN: [id number] Date of Birth: 1933/06/11

## 2018-12-08 ENCOUNTER — Telehealth (INDEPENDENT_AMBULATORY_CARE_PROVIDER_SITE_OTHER): Payer: Self-pay | Admitting: Orthopaedic Surgery

## 2018-12-08 ENCOUNTER — Other Ambulatory Visit: Payer: Self-pay | Admitting: Orthopaedic Surgery

## 2018-12-08 MED ORDER — HYDROCODONE-ACETAMINOPHEN 5-325 MG PO TABS: 1.0000 | ORAL_TABLET | ORAL | 0 refills | Status: DC | PRN

## 2018-12-08 NOTE — Telephone Encounter (Signed)
Patient calling to request pain Rx for the R hip. Had surgery with Dr. Ninfa Linden on October 31st. Please call in @ [facility] on [address].   cb  [phone]

## 2018-12-08 NOTE — Telephone Encounter (Signed)
I'll send some in. 

## 2018-12-08 NOTE — Telephone Encounter (Signed)
I called patient and advised. 

## 2018-12-08 NOTE — Telephone Encounter (Signed)
Please advise 

## 2018-12-12 ENCOUNTER — Ambulatory Visit: Payer: Medicare Other | Admitting: Physical Therapy

## 2018-12-12 DIAGNOSIS — R2689 Other abnormalities of gait and mobility: Secondary | ICD-10-CM | POA: Diagnosis not present

## 2018-12-12 DIAGNOSIS — M25551 Pain in right hip: Secondary | ICD-10-CM | POA: Diagnosis not present

## 2018-12-12 DIAGNOSIS — M25651 Stiffness of right hip, not elsewhere classified: Secondary | ICD-10-CM | POA: Diagnosis not present

## 2018-12-12 NOTE — Therapy (Signed)
[place], [name], [address] Phone: [phone]   Fax:  [phone]  Physical Therapy Treatment  Patient Details  Name: [name] MRN: [id number] Date of Birth: [birth year] Referring Provider (PT): Dr Jean Rosenthal    Encounter Date: 12/12/2018  PT End of Session - 12/12/18 1131    Visit Number  4    Number of Visits  16    Date for PT Re-Evaluation  01/17/19    Authorization Type  Medicare triad     PT Start Time  1100    PT Stop Time  1143    PT Time Calculation (min)  43 min    Activity Tolerance  Patient tolerated treatment well    Behavior During Therapy  [facility] for tasks assessed/performed       Past Medical History:  Diagnosis Date  . Atherosclerotic heart disease native coronary artery w/angina pectoris ([place]) 1992   a. 1992 s/p PTCA of LAD;  b. 1998 CABG x 3; c. 07/2001 Cath: Sev LM/LAD/LCX dzs, 3/3 patent grafts; d. 11/2013 Cath: stable graft anatomy; e. 10/2016 Cath: native 3VD, VG->OM nl, LIMA->LAD nl, VG->Diag 55.  . Bradycardia, mild briefly to the 40s, asymptomatic 05/16/2013  . Carotid arterial disease ([place])    a. 05/2014 Carotid U/S: No signif bilat ICA stenosis, >60% L ECA.  Marland Kitchen Chronic anemia   . Chronic diastolic CHF (congestive heart failure) ([place])    a. 10/2016 Echo: Ef 55-60%, no rwma, Gr1 DD, triv AI, PASP 33mmHg, atrial septal aneurysm.  . Chronic diastolic CHF (congestive heart failure), NYHA class 2 ([place]) 08/11/2017  . DM (diabetes mellitus), type 2 with peripheral vascular complications (H[facility])    On Oral medications.  . Essential hypertension   . GERD (gastroesophageal reflux disease)   . Hyperlipidemia with target LDL less than 70   . PAD (peripheral artery disease) ([place]) 05/2013; 09/2013   a. severe calcified POP-1 & TP Trunk Dz bilat;  b . 05/2013 Diamondback Rot Athrectomy (R POP) --> PTA R Pop, DES to R Peroneal;  c. 09/2013 PTA/Stenting of L Pop [name] - retrograde  access);  d. 04/2014 ABI's: R/L: 1.1/1.1.  Marland Kitchen Peptic ulcer disease   . S/P CABG x 3 1998   LIMA-LAD, SVG-OM, SVG-D1  . Severe claudication ([place]) 05/2013   Referred for Peripheral Angio (Dr. Gwenlyn Found --> Dr. Brunetta Jeans in [place])    Past Surgical History:  Procedure Laterality Date  . ATHERECTOMY Right 05/15/2013   Procedure: ATHERECTOMY;  Surgeon: Lorretta Harp, MD;  Location: [facility] CATH LAB;  Service: Cardiovascular;  Laterality: Right;  popliteal  . BACK SURGERY    . CARDIAC CATHETERIZATION  07/17/01   2 v CAD with LM, circ, LAD, obtuse diag, mod stenosis of diag vein graft , mild stenosis of marg vein graft, nl EF, medical treatment  . CARDIAC CATHETERIZATION  11/2013   Widely patent LIMA-LAD & SVG-OM with mild progression of proximal stenosis ~40-50% in SVG-D1; Widely patent native RCA with known severe native LCA disease  . CARDIAC CATHETERIZATION N/A 11/01/2016   Procedure: Left Heart Cath and Cors/Grafts Angiography;  Surgeon: [name], MD;  Location: [facility] CV LAB;  Service: Cardiovascular: Hemodynamics: High LVEDP!.  Cors/Grafts: LM 55%, o-p LAD 100% then after D1 -> LIMA-LAD patent, SVG-Diag ~55%. small RI wiht ~70% ostial. O-p Cx 80% - pCx 70% --> SVG-bifurcating OM patent. -- med Rx  . CORONARY ANGIOPLASTY  1992   PCI to LAD  .  CORONARY ARTERY BYPASS GRAFT  1998   LIMA-LAD, SVG-diagonal (none roughly 50% stenosis), SVG-OM  . LEFT HEART CATHETERIZATION WITH CORONARY ANGIOGRAM N/A 11/27/2013   Procedure: LEFT HEART CATHETERIZATION WITH CORONARY ANGIOGRAM;  Surgeon: [name], MD;  Location: [facility] CATH LAB;  Service: Cardiovascular;  Laterality: N/A;  . LOW EXTREMITY DOPPLERS/ABI  10/2016   Patent right SFA, popliteal and peroneal tendons. Patent left SFA and popliteal stent. 30-50% stenosis in the right common femoral and popliteal arteries, 50-74% stenosis in left profunda femoris artery. --> 1 year follow-up.  RABI - 1.2, LABI 1.1  . LOWER EXTREMITY ANGIOGRAM N/A 02/22/2013    Procedure: LOWER EXTREMITY ANGIOGRAM;  Surgeon: Lorretta Harp, MD;  Location: [facility] CATH LAB;  Service: Cardiovascular;  Laterality: N/A;  . LOWER EXTREMITY ANGIOGRAM N/A 07/20/2013   Procedure: LOWER EXTREMITY ANGIOGRAM;  Surgeon: Lorretta Harp, MD;  Location: [facility] CATH LAB;  Service: Cardiovascular;  Laterality: N/A;  . NM MYOVIEW LTD  04/03/2013; 5/26'/2016   Lexiscan: a)  Apical perfusion defect with no ischemia. Consider prior infarct versus breast attenuation.;; b) 5/'16: LOW RISK, Nl EF ~55-65%, No Ischemia /Infarction  . NM MYOVIEW LTD  08/12/2017   Multiple low RISK. Normal study. No EKG changes. 1.5 mm T wave inversions noted in V2-V4 that began 1 minute into stress. -Ended 3 minutes into rest.  EF 55-65%  . PERCUTANEOUS STENT INTERVENTION Right 05/15/2013   Procedure: PERCUTANEOUS STENT INTERVENTION;  Surgeon: Lorretta Harp, MD;  Location: [facility] CATH LAB;  Service: Cardiovascular;  Laterality: Right;  tibioperoneal trunk  . Peroneal Artery Stent  05/15/13   Diamondback rot. atherectomy of high grade segmental popliteal stenosis then Chocolate balloonPTA; then angiosculpt PTA of the prox peroneal with stent-Xpedition   . pv angio  07/20/13   high-grade calcified popliteal disease with one-vessel runoff via a small anterior tibial artery that has proximal disease as well, no intervention  . TOTAL HIP ARTHROPLASTY Right 10/12/2018   Procedure: RIGHT TOTAL HIP ARTHROPLASTY ANTERIOR APPROACH;  Surgeon: Mcarthur Rossetti, MD;  Location: [facility] ORS;  Service: Orthopedics;  Laterality: Right;  . TRANSTHORACIC ECHOCARDIOGRAM  10/2016   EF 55-60%. Gr 1 DD. Normal PAP. Aortic Sclerosis.  . TRANSTHORACIC ECHOCARDIOGRAM  02/2011; 04/2015   a) EF 50-55%, mild inf HK;; b)  Nl LV Size & Fxn EF 60-65%. mild LVH, Gr 1 DD.  . TUBAL LIGATION      There were no vitals filed for this visit.  Subjective Assessment - 12/12/18 1113    Subjective  Patoient reports she had a good day yesterday but today she is  really sore. She feels like when she sits too long she has difficulty standing up.     Limitations  Standing;Walking    How long can you stand comfortably?  Patient can stand for about 10-15 minutes     How long can you walk comfortably?  can walk limited distances around the clinic     Diagnostic tests  Nothing post-op     Patient Stated Goals  to be the best she can with her mobility     Currently in Pain?  Yes    Pain Score  5     Pain Location  Hip    Pain Orientation  Right;Anterior    Pain Descriptors / Indicators  Aching    Pain Type  Surgical pain    Pain Onset  More than a month ago    Pain Frequency  Constant    Aggravating  Factors   standing and walking     Pain Relieving Factors  rest     Effect of Pain on Daily Activities  difficulty perfroming ADL's                        [facility] Adult PT Treatment/Exercise - 12/12/18 0001      Therapeutic Activites    Therapeutic Activities  Other Therapeutic Activities    Other Therapeutic Activities  sit to stand 50 cm  Performed 2x3 with mod cuing for technique       Knee/Hip Exercises: Seated   Long Arc Quad Limitations  x10 less range as she fatigued     Other Seated Knee/Hip Exercises  clam shell no band 2x10       Knee/Hip Exercises: Supine   Quad Sets Limitations  2x10 5 sec hold     Short Arc Quad Sets Limitations  2x10     Hip Adduction Isometric  10 reps      Manual Therapy   Manual therapy comments  quad roll out with tiger tail     Passive ROM  gentle PROM              PT Education - 12/12/18 1130    Education Details  reviewed activity progression     Person(s) Educated  Patient    Methods  Explanation;Demonstration;Tactile cues;Verbal cues    Comprehension  Verbalized understanding;Returned demonstration;Verbal cues required;Tactile cues required       PT Short Term Goals - 12/07/18 1439      PT SHORT TERM GOAL #1   Title  Patient will increase passive right hip flexion by 20 degrees      Baseline  per visual inspection improved but still limited     Time  4    Period  Weeks    Status  On-going      PT SHORT TERM GOAL #2   Title  Patient will increase gross right hip flexion to 4/5     Time  4    Period  Weeks    Status  On-going      PT SHORT TERM GOAL #3   Title  Patient will ambualte 34' with a single point cane     Time  4    Period  Weeks    Status  On-going        PT Long Term Goals - 11/22/18 1311      PT LONG TERM GOAL #1   Title  Patient will stand for 1/2 hour ewithout self report of pain in order to perfrom ADL's     Time  8    Period  Weeks    Status  New    Target Date  01/17/19      PT LONG TERM GOAL #2   Title  Patient will ambualte 3000' with LRAD without self report of pain in order to perfrom ADL's     Time  8    Period  Weeks    Status  New    Target Date  01/17/19      PT LONG TERM GOAL #3   Title  Patient will demonstrate a 49% limitation on FOTO     Time  8    Period  Weeks    Status  New    Target Date  01/17/19            Plan - 12/12/18 1401  Clinical Impression Statement  Although pateint is frustrated by her pain she is improving. She had increased tolerance to manual therapy today. She was able to lift her leg up on the table. With cuing she had improved sit to stand. She wpould benefit from continued skilled therapy to imporve right hip strength and flexion movement     Clinical Presentation  Evolving    Clinical Decision Making  Moderate    Rehab Potential  Good    PT Frequency  2x / week    PT Duration  8 weeks    PT Treatment/Interventions  ADLs/Self Care Home Management;Cryotherapy;Electrical Stimulation;Iontophoresis 4mg /ml Dexamethasone;Ultrasound;DME Instruction;Moist Heat;Gait training;Stair training;Therapeutic activities;Therapeutic exercise;Neuromuscular re-education;Patient/family education;Manual techniques;Passive range of motion;Taping    PT Next Visit Plan  review gentle hip strength HEP,  review standing weight shifting, considergait training in the II bars. start standing hip flexion when able.     PT Home Exercise Plan  HHPT HEP: standing march, standing hip abduction, side steps at counter, mini squats. [facility] HEP: glut squeeze , bridge, ball squeeze, clam red band        Patient will benefit from skilled therapeutic intervention in order to improve the following deficits and impairments:  Abnormal gait, Pain, Postural dysfunction, Decreased activity tolerance, Decreased endurance, Decreased range of motion, Decreased strength, Impaired perceived functional ability, Difficulty walking  Visit Diagnosis: Pain in right hip  Stiffness of right hip, not elsewhere classified  Other abnormalities of gait and mobility     Problem List Patient Active Problem List   Diagnosis Date Noted  . Status post total replacement of right hip 10/12/2018  . Unilateral primary osteoarthritis, right hip 09/12/2018  . Degenerative joint disease 05/30/2018  . Anal fissure 11/10/2017  . Rectal bleeding 11/10/2017  . Rectal pain 11/10/2017  . Inflammatory arthritis 10/13/2017  . Latent tuberculosis by blood test 10/13/2017  . Chest pain with high risk for cardiac etiology 08/11/2017  . Chronic diastolic CHF (congestive heart failure), NYHA class 2 ([place]) 08/11/2017  . Fatigue due to treatment 01/09/2017  . NSTEMI (non-ST elevated myocardial infarction) ([place]) 11/01/2016  . Diabetes mellitus ([place]) 10/24/2015  . Non-insulin dependent type 2 diabetes mellitus ([place]) 10/24/2015  . Angina, class I ([place]) 04/22/2015  . DOE (dyspnea on exertion) 04/22/2015  . Leg edema, left 04/22/2015  . Accumulation of fluid in tissues 03/21/2014  . Vertigo, central 01/02/2014  . Vertigo 01/02/2014  . Limb pain- echymosis, pain Lt radial artery after cath 12/11/2013  . Chest pain with low risk for cardiac etiology- patent grafts on cath 11/26/13; ? microvascular ischemia 11/28/2013  . Dyslipidemia, goal LDL  below 70 05/17/2013  . Accelerated hypertension with diastolic congestive heart failure, NYHA class 3 ([place]) 05/17/2013  . Claudication ([place]) 05/16/2013  . PTA/ Stent Rt popliteal and Rt peroneal artery 05/16/13 05/16/2013  . PAD,severe calcified pop and tibial peroneal trunk disease bilateraly   . ABDOMINAL PAIN RIGHT LOWER QUADRANT 10/07/2010  . CVA-STROKE 03/04/2010  . BARRETT'S ESOPHAGUS 03/04/2010  . FECAL INCONTINENCE 03/04/2010  . DM 01/14/2010  . ANEMIA OF CHRONIC DISEASE 01/14/2010  . Atherosclerotic heart disease of native coronary artery with angina pectoris ([place]) 01/14/2010  . DIVERTICULOSIS OF COLON 01/14/2010  . PERSONAL HISTORY OF COLONIC POLYPS 01/14/2010  . SHELLFISH ALLERGY 01/14/2010    Carney Living PT DPT  12/12/2018, 2:03 PM  [facility] [address] [place], [name], [address] Phone: [phone]   Fax:  [phone]  Name: [name] MRN:  [id number] Date of Birth: 1933-04-02

## 2018-12-14 ENCOUNTER — Encounter: Payer: Self-pay | Admitting: Physical Therapy

## 2018-12-14 ENCOUNTER — Ambulatory Visit: Payer: Medicare Other | Attending: Orthopaedic Surgery | Admitting: Physical Therapy

## 2018-12-14 DIAGNOSIS — R2689 Other abnormalities of gait and mobility: Secondary | ICD-10-CM

## 2018-12-14 DIAGNOSIS — M25551 Pain in right hip: Secondary | ICD-10-CM | POA: Diagnosis not present

## 2018-12-14 DIAGNOSIS — M25651 Stiffness of right hip, not elsewhere classified: Secondary | ICD-10-CM | POA: Diagnosis not present

## 2018-12-14 NOTE — Therapy (Signed)
[place], [name], [address] Phone: [phone]   Fax:  [phone]  Physical Therapy Treatment  Patient Details  Name: [name] MRN: [id number] Date of Birth: 27-Oct-1933 Referring Provider (PT): Dr Jean Rosenthal    Encounter Date: 12/14/2018  PT End of Session - 12/14/18 0944    Visit Number  5    Number of Visits  16    Date for PT Re-Evaluation  01/17/19    Authorization Type  Medicare triad     PT Start Time  0932    PT Stop Time  1005   not appropriate for further treatment    PT Time Calculation (min)  33 min    Activity Tolerance  Patient tolerated treatment well    Behavior During Therapy  [facility] for tasks assessed/performed       Past Medical History:  Diagnosis Date  . Atherosclerotic heart disease native coronary artery w/angina pectoris ([place]) 1992   a. 1992 s/p PTCA of LAD;  b. 1998 CABG x 3; c. 07/2001 Cath: Sev LM/LAD/LCX dzs, 3/3 patent grafts; d. 11/2013 Cath: stable graft anatomy; e. 10/2016 Cath: native 3VD, VG->OM nl, LIMA->LAD nl, VG->Diag 55.  . Bradycardia, mild briefly to the 40s, asymptomatic 05/16/2013  . Carotid arterial disease ([place])    a. 05/2014 Carotid U/S: No signif bilat ICA stenosis, >60% L ECA.  Marland Kitchen Chronic anemia   . Chronic diastolic CHF (congestive heart failure) ([place])    a. 10/2016 Echo: Ef 55-60%, no rwma, Gr1 DD, triv AI, PASP 8mmHg, atrial septal aneurysm.  . Chronic diastolic CHF (congestive heart failure), NYHA class 2 ([place]) 08/11/2017  . DM (diabetes mellitus), type 2 with peripheral vascular complications ([facility])    On Oral medications.  . Essential hypertension   . GERD (gastroesophageal reflux disease)   . Hyperlipidemia with target LDL less than 70   . PAD (peripheral artery disease) ([place]) 05/2013; 09/2013   a. severe calcified POP-1 & TP Trunk Dz bilat;  b . 05/2013 Diamondback Rot Athrectomy (R POP) --> PTA R Pop, DES to R Peroneal;  c. 09/2013  PTA/Stenting of L Pop [name] - retrograde access);  d. 04/2014 ABI's: R/L: 1.1/1.1.  Marland Kitchen Peptic ulcer disease   . S/P CABG x 3 1998   LIMA-LAD, SVG-OM, SVG-D1  . Severe claudication ([place]) 05/2013   Referred for Peripheral Angio (Dr. Gwenlyn Found --> Dr. Brunetta Jeans in [place])    Past Surgical History:  Procedure Laterality Date  . ATHERECTOMY Right 05/15/2013   Procedure: ATHERECTOMY;  Surgeon: Lorretta Harp, MD;  Location: [facility] CATH LAB;  Service: Cardiovascular;  Laterality: Right;  popliteal  . BACK SURGERY    . CARDIAC CATHETERIZATION  07/17/01   2 v CAD with LM, circ, LAD, obtuse diag, mod stenosis of diag vein graft , mild stenosis of marg vein graft, nl EF, medical treatment  . CARDIAC CATHETERIZATION  11/2013   Widely patent LIMA-LAD & SVG-OM with mild progression of proximal stenosis ~40-50% in SVG-D1; Widely patent native RCA with known severe native LCA disease  . CARDIAC CATHETERIZATION N/A 11/01/2016   Procedure: Left Heart Cath and Cors/Grafts Angiography;  Surgeon: [name], MD;  Location: [facility] CV LAB;  Service: Cardiovascular: Hemodynamics: High LVEDP!.  Cors/Grafts: LM 55%, o-p LAD 100% then after D1 -> LIMA-LAD patent, SVG-Diag ~55%. small RI wiht ~70% ostial. O-p Cx 80% - pCx 70% --> SVG-bifurcating OM patent. -- med Rx  . CORONARY ANGIOPLASTY  1992   PCI to LAD  . CORONARY ARTERY BYPASS GRAFT  1998   LIMA-LAD, SVG-diagonal (none roughly 50% stenosis), SVG-OM  . LEFT HEART CATHETERIZATION WITH CORONARY ANGIOGRAM N/A 11/27/2013   Procedure: LEFT HEART CATHETERIZATION WITH CORONARY ANGIOGRAM;  Surgeon: [name], MD;  Location: [facility] CATH LAB;  Service: Cardiovascular;  Laterality: N/A;  . LOW EXTREMITY DOPPLERS/ABI  10/2016   Patent right SFA, popliteal and peroneal tendons. Patent left SFA and popliteal stent. 30-50% stenosis in the right common femoral and popliteal arteries, 50-74% stenosis in left profunda femoris artery. --> 1 year follow-up.  RABI - 1.2, LABI  1.1  . LOWER EXTREMITY ANGIOGRAM N/A 02/22/2013   Procedure: LOWER EXTREMITY ANGIOGRAM;  Surgeon: Lorretta Harp, MD;  Location: [facility] CATH LAB;  Service: Cardiovascular;  Laterality: N/A;  . LOWER EXTREMITY ANGIOGRAM N/A 07/20/2013   Procedure: LOWER EXTREMITY ANGIOGRAM;  Surgeon: Lorretta Harp, MD;  Location: [facility] CATH LAB;  Service: Cardiovascular;  Laterality: N/A;  . NM MYOVIEW LTD  04/03/2013; 5/26'/2016   Lexiscan: a)  Apical perfusion defect with no ischemia. Consider prior infarct versus breast attenuation.;; b) 5/'16: LOW RISK, Nl EF ~55-65%, No Ischemia /Infarction  . NM MYOVIEW LTD  08/12/2017   Multiple low RISK. Normal study. No EKG changes. 1.5 mm T wave inversions noted in V2-V4 that began 1 minute into stress. -Ended 3 minutes into rest.  EF 55-65%  . PERCUTANEOUS STENT INTERVENTION Right 05/15/2013   Procedure: PERCUTANEOUS STENT INTERVENTION;  Surgeon: Lorretta Harp, MD;  Location[facility] CATH LAB;  Service: Cardiovascular;  Laterality: Right;  tibioperoneal trunk  . Peroneal Artery Stent  05/15/13   Diamondback rot. atherectomy of high grade segmental popliteal stenosis then Chocolate balloonPTA; then angiosculpt PTA of the prox peroneal with stent-Xpedition   . pv angio  07/20/13   high-grade calcified popliteal disease with one-vessel runoff via a small anterior tibial artery that has proximal disease as well, no intervention  . TOTAL HIP ARTHROPLASTY Right 10/12/2018   Procedure: RIGHT TOTAL HIP ARTHROPLASTY ANTERIOR APPROACH;  Surgeon: Mcarthur Rossetti, MD;  Location: [facility] ORS;  Service: Orthopedics;  Laterality: Right;  . TRANSTHORACIC ECHOCARDIOGRAM  10/2016   EF 55-60%. Gr 1 DD. Normal PAP. Aortic Sclerosis.  . TRANSTHORACIC ECHOCARDIOGRAM  02/2011; 04/2015   a) EF 50-55%, mild inf HK;; b)  Nl LV Size & Fxn EF 60-65%. mild LVH, Gr 1 DD.  . TUBAL LIGATION      There were no vitals filed for this visit.  Subjective Assessment - 12/14/18 0942    Subjective  Patient reports  she woke up this morning and is very sore. She feelslikt the tizsue around the hip is very sore. She felt OK after the last session and yesterday. she woke up today in a lot of pain.     Limitations  Standing;Walking    How long can you stand comfortably?  Patient can stand for about 10-15 minutes     How long can you walk comfortably?  can walk limited distances around the clinic     Diagnostic tests  Nothing post-op     Patient Stated Goals  to be the best she can with her mobility     Currently in Pain?  Yes    Pain Score  4     Pain Location  Hip    Pain Orientation  Right;Anterior    Pain Descriptors / Indicators  Aching    Pain Type  Surgical pain  Pain Onset  More than a month ago    Pain Frequency  Constant    Aggravating Factors   standing and walking     Pain Relieving Factors  rest     Effect of Pain on Daily Activities  difficulty perfroming ADL's                        [facility] Adult PT Treatment/Exercise - 12/14/18 0001      Knee/Hip Exercises: Aerobic   Nustep  moderate cuing for ROM wihtout increased pain x5 min       Manual Therapy   Manual therapy comments  quad roll out with tiger tail     Passive ROM  gentle PROM              PT Education - 12/14/18 0943    Education Details  reviewed HEP, symptom management     Person(s) Educated  Patient    Methods  Explanation;Demonstration;Tactile cues;Verbal cues    Comprehension  Returned demonstration;Verbalized understanding;Verbal cues required;Tactile cues required       PT Short Term Goals - 12/07/18 1439      PT SHORT TERM GOAL #1   Title  Patient will increase passive right hip flexion by 20 degrees     Baseline  per visual inspection improved but still limited     Time  4    Period  Weeks    Status  On-going      PT SHORT TERM GOAL #2   Title  Patient will increase gross right hip flexion to 4/5     Time  4    Period  Weeks    Status  On-going      PT SHORT TERM GOAL #3    Title  Patient will ambualte 57' with a single point cane     Time  4    Period  Weeks    Status  On-going        PT Long Term Goals - 11/22/18 1311      PT LONG TERM GOAL #1   Title  Patient will stand for 1/2 hour ewithout self report of pain in order to perfrom ADL's     Time  8    Period  Weeks    Status  New    Target Date  01/17/19      PT LONG TERM GOAL #2   Title  Patient will ambualte 3000' with LRAD without self report of pain in order to perfrom ADL's     Time  8    Period  Weeks    Status  New    Target Date  01/17/19      PT LONG TERM GOAL #3   Title  Patient will demonstrate a 49% limitation on FOTO     Time  8    Period  Weeks    Status  New    Target Date  01/17/19            Plan - 12/14/18 1034    Clinical Impression Statement  Patiet was very limited today. She reported feeling like her skin was very tender around the inicision site. She reports the incicion site is closed. She will see the MD on MOnday. Therapy attempted to perfrom PROM but after a few minutes it was noted that she was having significant pain. PROM was halted and ice was put on her hip. She reports that she will  have a day or two where her hip feels good but then she has a day like this. She also reported feeling a little syncopal at one point. Her B/.P was measured at 106/66. She was advised if she continues to feel like this to contact her primary care MD or her surgeon.     Clinical Presentation  Evolving    Clinical Decision Making  Moderate    Rehab Potential  Good    PT Frequency  2x / week    PT Duration  8 weeks    PT Treatment/Interventions  ADLs/Self Care Home Management;Cryotherapy;Electrical Stimulation;Iontophoresis 4mg /ml Dexamethasone;Ultrasound;DME Instruction;Moist Heat;Gait training;Stair training;Therapeutic activities;Therapeutic exercise;Neuromuscular re-education;Patient/family education;Manual techniques;Passive range of motion;Taping    PT Next Visit Plan   review gentle hip strength HEP, review standing weight shifting, considergait training in the II bars. start standing hip flexion when able.     PT Home Exercise Plan  [facility] HEP: standing march, standing hip abduction, side steps at counter, mini squats. [facility] HEP: glut squeeze , bridge, ball squeeze, clam red band        Patient will benefit from skilled therapeutic intervention in order to improve the following deficits and impairments:  Abnormal gait, Pain, Postural dysfunction, Decreased activity tolerance, Decreased endurance, Decreased range of motion, Decreased strength, Impaired perceived functional ability, Difficulty walking  Visit Diagnosis: Pain in right hip  Stiffness of right hip, not elsewhere classified  Other abnormalities of gait and mobility     Problem List Patient Active Problem List   Diagnosis Date Noted  . Status post total replacement of right hip 10/12/2018  . Unilateral primary osteoarthritis, right hip 09/12/2018  . Degenerative joint disease 05/30/2018  . Anal fissure 11/10/2017  . Rectal bleeding 11/10/2017  . Rectal pain 11/10/2017  . Inflammatory arthritis 10/13/2017  . Latent tuberculosis by blood test 10/13/2017  . Chest pain with high risk for cardiac etiology 08/11/2017  . Chronic diastolic CHF (congestive heart failure), NYHA class 2 ([place]) 08/11/2017  . Fatigue due to treatment 01/09/2017  . NSTEMI (non-ST elevated myocardial infarction) ([place]) 11/01/2016  . Diabetes mellitus ([place]) 10/24/2015  . Non-insulin dependent type 2 diabetes mellitus ([place]) 10/24/2015  . Angina, class I ([place]) 04/22/2015  . DOE (dyspnea on exertion) 04/22/2015  . Leg edema, left 04/22/2015  . Accumulation of fluid in tissues 03/21/2014  . Vertigo, central 01/02/2014  . Vertigo 01/02/2014  . Limb pain- echymosis, pain Lt radial artery after cath 12/11/2013  . Chest pain with low risk for cardiac etiology- patent grafts on cath 11/26/13; ? microvascular ischemia  11/28/2013  . Dyslipidemia, goal LDL below 70 05/17/2013  . Accelerated hypertension with diastolic congestive heart failure, NYHA class 3 ([place]) 05/17/2013  . Claudication ([place]) 05/16/2013  . PTA/ Stent Rt popliteal and Rt peroneal artery 05/16/13 05/16/2013  . PAD,severe calcified pop and tibial peroneal trunk disease bilateraly   . ABDOMINAL PAIN RIGHT LOWER QUADRANT 10/07/2010  . CVA-STROKE 03/04/2010  . BARRETT'S ESOPHAGUS 03/04/2010  . FECAL INCONTINENCE 03/04/2010  . DM 01/14/2010  . ANEMIA OF CHRONIC DISEASE 01/14/2010  . Atherosclerotic heart disease of native coronary artery with angina pectoris ([place]) 01/14/2010  . DIVERTICULOSIS OF COLON 01/14/2010  . PERSONAL HISTORY OF COLONIC POLYPS 01/14/2010  . SHELLFISH ALLERGY 01/14/2010    Carney Living PT DPT  12/14/2018, 10:49 AM  [facility] [address] [place], [name], [address] Phone: [phone]   Fax:  [phone]  Name: GENNESIS HOGLAND MRN: [id number] Date of  Birth: [birth year]

## 2018-12-15 ENCOUNTER — Encounter: Payer: Self-pay | Admitting: Podiatry

## 2018-12-15 ENCOUNTER — Other Ambulatory Visit: Payer: Self-pay | Admitting: Orthopaedic Surgery

## 2018-12-15 ENCOUNTER — Ambulatory Visit (INDEPENDENT_AMBULATORY_CARE_PROVIDER_SITE_OTHER): Payer: Medicare Other | Admitting: Podiatry

## 2018-12-15 DIAGNOSIS — M79674 Pain in right toe(s): Secondary | ICD-10-CM

## 2018-12-15 DIAGNOSIS — E1151 Type 2 diabetes mellitus with diabetic peripheral angiopathy without gangrene: Secondary | ICD-10-CM

## 2018-12-15 DIAGNOSIS — B351 Tinea unguium: Secondary | ICD-10-CM | POA: Diagnosis not present

## 2018-12-15 DIAGNOSIS — M79675 Pain in left toe(s): Secondary | ICD-10-CM

## 2018-12-15 NOTE — Patient Instructions (Signed)
Diabetes Mellitus and Foot Care Foot care is an important part of your health, especially when you have diabetes. Diabetes may cause you to have problems because of poor blood flow (circulation) to your feet and legs, which can cause your skin to:  Become thinner and drier.  Break more easily.  Heal more slowly.  Peel and crack. You may also have nerve damage (neuropathy) in your legs and feet, causing decreased feeling in them. This means that you may not notice minor injuries to your feet that could lead to more serious problems. Noticing and addressing any potential problems early is the best way to prevent future foot problems. How to care for your feet Foot hygiene  Wash your feet daily with warm water and mild soap. Do not use hot water. Then, pat your feet and the areas between your toes until they are completely dry. Do not soak your feet as this can dry your skin.  Trim your toenails straight across. Do not dig under them or around the cuticle. File the edges of your nails with an emery board or nail file.  Apply a moisturizing lotion or petroleum jelly to the skin on your feet and to dry, brittle toenails. Use lotion that does not contain alcohol and is unscented. Do not apply lotion between your toes. Shoes and socks  Wear clean socks or stockings every day. Make sure they are not too tight. Do not wear knee-high stockings since they may decrease blood flow to your legs.  Wear shoes that fit properly and have enough cushioning. Always look in your shoes before you put them on to be sure there are no objects inside.  To break in new shoes, wear them for just a few hours a day. This prevents injuries on your feet. Wounds, scrapes, corns, and calluses  Check your feet daily for blisters, cuts, bruises, sores, and redness. If you cannot see the bottom of your feet, use a mirror or ask someone for help.  Do not cut corns or calluses or try to remove them with medicine.  If you  find a minor scrape, cut, or break in the skin on your feet, keep it and the skin around it clean and dry. You may clean these areas with mild soap and water. Do not clean the area with peroxide, alcohol, or iodine.  If you have a wound, scrape, corn, or callus on your foot, look at it several times a day to make sure it is healing and not infected. Check for: ? Redness, swelling, or pain. ? Fluid or blood. ? Warmth. ? Pus or a bad smell. General instructions  Do not cross your legs. This may decrease blood flow to your feet.  Do not use heating pads or hot water bottles on your feet. They may burn your skin. If you have lost feeling in your feet or legs, you may not know this is happening until it is too late.  Protect your feet from hot and cold by wearing shoes, such as at the beach or on hot pavement.  Schedule a complete foot exam at least once a year (annually) or more often if you have foot problems. If you have foot problems, report any cuts, sores, or bruises to your health care provider immediately. Contact a health care provider if:  You have a medical condition that increases your risk of infection and you have any cuts, sores, or bruises on your feet.  You have an injury that is not   healing.  You have redness on your legs or feet.  You feel burning or tingling in your legs or feet.  You have pain or cramps in your legs and feet.  Your legs or feet are numb.  Your feet always feel cold.  You have pain around a toenail. Get help right away if:  You have a wound, scrape, corn, or callus on your foot and: ? You have pain, swelling, or redness that gets worse. ? You have fluid or blood coming from the wound, scrape, corn, or callus. ? Your wound, scrape, corn, or callus feels warm to the touch. ? You have pus or a bad smell coming from the wound, scrape, corn, or callus. ? You have a fever. ? You have a red line going up your leg. Summary  Check your feet every day  for cuts, sores, red spots, swelling, and blisters.  Moisturize feet and legs daily.  Wear shoes that fit properly and have enough cushioning.  If you have foot problems, report any cuts, sores, or bruises to your health care provider immediately.  Schedule a complete foot exam at least once a year (annually) or more often if you have foot problems. This information is not intended to replace advice given to you by your health care provider. Make sure you discuss any questions you have with your health care provider. Document Released: 11/26/2000 Document Revised: 01/11/2018 Document Reviewed: 12/31/2016 Elsevier Interactive Patient Education  2019 [employer] Inc.  Onychomycosis/Fungal Toenails  WHAT IS IT? An infection that lies within the keratin of your nail plate that is caused by a fungus.  WHY ME? Fungal infections affect all ages, sexes, races, and creeds.  There may be many factors that predispose you to a fungal infection such as age, coexisting medical conditions such as diabetes, or an autoimmune disease; stress, medications, fatigue, genetics, etc.  Bottom line: fungus thrives in a warm, moist environment and your shoes offer such a location.  IS IT CONTAGIOUS? Theoretically, yes.  You do not want to share shoes, nail clippers or files with someone who has fungal toenails.  Walking around barefoot in the same room or sleeping in the same bed is unlikely to transfer the organism.  It is important to realize, however, that fungus can spread easily from one nail to the next on the same foot.  HOW DO WE TREAT THIS?  There are several ways to treat this condition.  Treatment may depend on many factors such as age, medications, pregnancy, liver and kidney conditions, etc.  It is best to ask your doctor which options are available to you.  1. No treatment.   Unlike many other medical concerns, you can live with this condition.  However for many people this can be a painful condition and  may lead to ingrown toenails or a bacterial infection.  It is recommended that you keep the nails cut short to help reduce the amount of fungal nail. 2. Topical treatment.  These range from herbal remedies to prescription strength nail lacquers.  About 40-50% effective, topicals require twice daily application for approximately 9 to 12 months or until an entirely new nail has grown out.  The most effective topicals are medical grade medications available through physicians offices. 3. Oral antifungal medications.  With an 80-90% cure rate, the most common oral medication requires 3 to 4 months of therapy and stays in your system for a year as the new nail grows out.  Oral antifungal medications do require   blood work to make sure it is a safe drug for you.  A liver function panel will be performed prior to starting the medication and after the first month of treatment.  It is important to have the blood work performed to avoid any harmful side effects.  In general, this medication safe but blood work is required. 4. Laser Therapy.  This treatment is performed by applying a specialized laser to the affected nail plate.  This therapy is noninvasive, fast, and non-painful.  It is not covered by insurance and is therefore, out of pocket.  The results have been very good with a 80-95% cure rate.  [facility] is the only practice in the area to offer this therapy. 5. Permanent Nail Avulsion.  Removing the entire nail so that a new nail will not grow back. 

## 2018-12-18 ENCOUNTER — Encounter (INDEPENDENT_AMBULATORY_CARE_PROVIDER_SITE_OTHER): Payer: Self-pay | Admitting: Orthopaedic Surgery

## 2018-12-18 ENCOUNTER — Ambulatory Visit (INDEPENDENT_AMBULATORY_CARE_PROVIDER_SITE_OTHER): Payer: Medicare Other | Admitting: Orthopaedic Surgery

## 2018-12-18 DIAGNOSIS — Z96641 Presence of right artificial hip joint: Secondary | ICD-10-CM

## 2018-12-18 NOTE — Progress Notes (Signed)
The patient is 10 weeks status post a right total hip arthroplasty.  She is 85 years old.  She ambulates with a walker.  She has had a harder time getting over the surgery in terms of right hip pain and swelling.  She has been going to outpatient therapy.  On exam her hip on the right side is well located.  There is still some slight leg swelling but her calf is soft.  Her incisions healed nicely.  There is no evidence of infection.  She still has pain with rotation of her right hip.  I gave her reassurance that some people certainly heal a little slower and that her hip was so bad before surgery that it may take her longer to completely get over things.  However I did give her reassurance that she is heading in the right direction.  From my standpoint I do not need to see her back for 3 months.  At that visit I would like a standing low AP pelvis and lateral of her right operative hip.  Obviously if there is any issues before then she will let us know.

## 2018-12-19 DIAGNOSIS — N390 Urinary tract infection, site not specified: Secondary | ICD-10-CM | POA: Diagnosis not present

## 2018-12-20 ENCOUNTER — Ambulatory Visit: Payer: Medicare Other | Admitting: Physical Therapy

## 2018-12-22 ENCOUNTER — Ambulatory Visit: Payer: Medicare Other | Admitting: Physical Therapy

## 2018-12-22 ENCOUNTER — Encounter: Payer: Self-pay | Admitting: Physical Therapy

## 2018-12-22 DIAGNOSIS — R2689 Other abnormalities of gait and mobility: Secondary | ICD-10-CM

## 2018-12-22 DIAGNOSIS — M25551 Pain in right hip: Secondary | ICD-10-CM | POA: Diagnosis not present

## 2018-12-22 DIAGNOSIS — M25651 Stiffness of right hip, not elsewhere classified: Secondary | ICD-10-CM | POA: Diagnosis not present

## 2018-12-22 NOTE — Therapy (Signed)
[name], [name], [address] Phone: [phone]   Fax:  [phone]  Physical Therapy Treatment  Patient Details  Name: [name] MRN: [id number] Date of Birth: 1933-02-14 Referring Provider (PT): Dr [name]    Encounter Date: 12/22/2018  PT End of Session - 12/22/18 1105    Visit Number  6    Number of Visits  16    Date for PT Re-Evaluation  01/17/19    Authorization Type  Medicare triad     PT Start Time  1100    PT Stop Time  1155    PT Time Calculation (min)  55 min       Past Medical History:  Diagnosis Date  . Atherosclerotic heart disease native coronary artery w/angina pectoris ([place]) 1992   a. 1992 s/p PTCA of LAD;  b. 1998 CABG x 3; c. 07/2001 Cath: Sev LM/LAD/LCX dzs, 3/3 patent grafts; d. 11/2013 Cath: stable graft anatomy; e. 10/2016 Cath: native 3VD, VG->OM nl, LIMA->LAD nl, VG->Diag 55.  . Bradycardia, mild briefly to the 40s, asymptomatic 05/16/2013  . Carotid arterial disease ([place])    a. 05/2014 Carotid U/S: No signif bilat ICA stenosis, >60% L ECA.  Marland Kitchen Chronic anemia   . Chronic diastolic CHF (congestive heart failure) ([place])    a. 10/2016 Echo: Ef 55-60%, no rwma, Gr1 DD, triv AI, PASP 8mmHg, atrial septal aneurysm.  . Chronic diastolic CHF (congestive heart failure), NYHA class 2 ([place]) 08/11/2017  . DM (diabetes mellitus), type 2 with peripheral vascular complications (HCC)    On Oral medications.  . Essential hypertension   . GERD (gastroesophageal reflux disease)   . Hyperlipidemia with target LDL less than 70   . PAD (peripheral artery disease) ([place]) 05/2013; 09/2013   a. severe calcified POP-1 & TP Trunk Dz bilat;  b . 05/2013 Diamondback Rot Athrectomy (R POP) --> PTA R Pop, DES to R Peroneal;  c. 09/2013 PTA/Stenting of L Pop [name] - retrograde access);  d. 04/2014 ABI's: R/L: 1.1/1.1.  Marland Kitchen Peptic ulcer disease   . S/P CABG x 3 1998   LIMA-LAD, SVG-OM, SVG-D1  .  Severe claudication ([place]) 05/2013   Referred for Peripheral Angio (Dr. Gwenlyn Flores --> Dr. Brunetta Flores in [place])    Past Surgical History:  Procedure Laterality Date  . ATHERECTOMY Right 05/15/2013   Procedure: ATHERECTOMY;  Surgeon: Monica Harp, MD;  Location: [facility] CATH LAB;  Service: Cardiovascular;  Laterality: Right;  popliteal  . BACK SURGERY    . CARDIAC CATHETERIZATION  07/17/01   2 v CAD with LM, circ, LAD, obtuse diag, mod stenosis of diag vein graft , mild stenosis of marg vein graft, nl EF, medical treatment  . CARDIAC CATHETERIZATION  11/2013   Widely patent LIMA-LAD & SVG-OM with mild progression of proximal stenosis ~40-50% in SVG-D1; Widely patent native RCA with known severe native LCA disease  . CARDIAC CATHETERIZATION N/A 11/01/2016   Procedure: Left Heart Cath and Cors/Grafts Angiography;  Surgeon: Monica Man, MD;  Location: [facility] LAB;  Service: Cardiovascular: Hemodynamics: High LVEDP!.  Cors/Grafts: LM 55%, o-p LAD 100% then after D1 -> LIMA-LAD patent, SVG-Diag ~55%. small RI wiht ~70% ostial. O-p Cx 80% - pCx 70% --> SVG-bifurcating OM patent. -- med Rx  . CORONARY ANGIOPLASTY  1992   PCI to LAD  . CORONARY ARTERY BYPASS GRAFT  1998   LIMA-LAD, SVG-diagonal (none roughly 50% stenosis), SVG-OM  . LEFT HEART  CATHETERIZATION WITH CORONARY ANGIOGRAM N/A 11/27/2013   Procedure: LEFT HEART CATHETERIZATION WITH CORONARY ANGIOGRAM;  Surgeon: Monica Man, MD;  Location: [facility] CATH LAB;  Service: Cardiovascular;  Laterality: N/A;  . LOW EXTREMITY DOPPLERS/ABI  10/2016   Patent right SFA, popliteal and peroneal tendons. Patent left SFA and popliteal stent. 30-50% stenosis in the right common femoral and popliteal arteries, 50-74% stenosis in left profunda femoris artery. --> 1 year follow-up.  RABI - 1.2, LABI 1.1  . LOWER EXTREMITY ANGIOGRAM N/A 02/22/2013   Procedure: LOWER EXTREMITY ANGIOGRAM;  Surgeon: Monica Harp, MD;  Location: [facility] CATH LAB;  Service:  Cardiovascular;  Laterality: N/A;  . LOWER EXTREMITY ANGIOGRAM N/A 07/20/2013   Procedure: LOWER EXTREMITY ANGIOGRAM;  Surgeon: Monica Harp, MD;  Location: [facility] CATH LAB;  Service: Cardiovascular;  Laterality: N/A;  . NM MYOVIEW LTD  04/03/2013; 5/26'/2016   Lexiscan: a)  Apical perfusion defect with no ischemia. Consider prior infarct versus breast attenuation.;; b) 5/'16: LOW RISK, Nl EF ~55-65%, No Ischemia /Infarction  . NM MYOVIEW LTD  08/12/2017   Multiple low RISK. Normal study. No EKG changes. 1.5 mm T wave inversions noted in V2-V4 that began 1 minute into stress. -Ended 3 minutes into rest.  EF 55-65%  . PERCUTANEOUS STENT INTERVENTION Right 05/15/2013   Procedure: PERCUTANEOUS STENT INTERVENTION;  Surgeon: Monica Harp, MD;  Location: [facility] CATH LAB;  Service: Cardiovascular;  Laterality: Right;  tibioperoneal trunk  . Peroneal Artery Stent  05/15/13   Diamondback rot. atherectomy of high grade segmental popliteal stenosis then Chocolate balloonPTA; then angiosculpt PTA of the prox peroneal with stent-Xpedition   . pv angio  07/20/13   high-grade calcified popliteal disease with one-vessel runoff via a small anterior tibial artery that has proximal disease as well, no intervention  . TOTAL HIP ARTHROPLASTY Right 10/12/2018   Procedure: RIGHT TOTAL HIP ARTHROPLASTY ANTERIOR APPROACH;  Surgeon: Monica Rossetti, MD;  Location: [facility] ORS;  Service: Orthopedics;  Laterality: Right;  . TRANSTHORACIC ECHOCARDIOGRAM  10/2016   EF 55-60%. Gr 1 DD. Normal PAP. Aortic Sclerosis.  . TRANSTHORACIC ECHOCARDIOGRAM  02/2011; 04/2015   a) EF 50-55%, mild inf HK;; b)  Nl LV Size & Fxn EF 60-65%. mild LVH, Gr 1 DD.  . TUBAL LIGATION      There were no vitals filed for this visit.  Subjective Assessment - 12/22/18 1104    Subjective  Doing better than I was.     Currently in Pain?  No/denies         [facility] PT Assessment - 12/22/18 0001      Right Hip   Right Hip Flexion  90                    [facility] Adult PT Treatment/Exercise - 12/22/18 0001      Knee/Hip Exercises: Stretches   Passive Hamstring Stretch  Right;2 reps;30 seconds      Knee/Hip Exercises: Aerobic   Nustep  L2 x 5 minutes LE only       Knee/Hip Exercises: Seated   Heel Slides  10 reps;2 sets    Other Seated Knee/Hip Exercises  clam shell x10 AROM, then x 10 with yellow band     Marching  10 reps    Marching Limitations  up to 90 degrees hip flexion      Knee/Hip Exercises: Supine   Quad Sets Limitations  10 reps    Short Arc Quad Sets Limitations  2 x  10    Hip Adduction Isometric  10 reps      Modalities   Modalities  Cryotherapy      Cryotherapy   Number Minutes Cryotherapy  10 Minutes    Cryotherapy Location  Hip    Type of Cryotherapy  Ice pack      Manual Therapy   Manual therapy comments  Supine soft tissue work to right anterior lateral thigh.                PT Short Term Goals - 12/07/18 1439      PT SHORT TERM GOAL #1   Title  Patient will increase passive right hip flexion by 20 degrees     Baseline  per visual inspection improved but still limited     Time  4    Period  Weeks    Status  On-going      PT SHORT TERM GOAL #2   Title  Patient will increase gross right hip flexion to 4/5     Time  4    Period  Weeks    Status  On-going      PT SHORT TERM GOAL #3   Title  Patient will ambualte 37' with a single point cane     Time  4    Period  Weeks    Status  On-going        PT Long Term Goals - 11/22/18 1311      PT LONG TERM GOAL #1   Title  Patient will stand for 1/2 hour ewithout self report of pain in order to perfrom ADL's     Time  8    Period  Weeks    Status  New    Target Date  01/17/19      PT LONG TERM GOAL #2   Title  Patient will ambualte 3000' with LRAD without self report of pain in order to perfrom ADL's     Time  8    Period  Weeks    Status  New    Target Date  01/17/19      PT LONG TERM GOAL #3   Title   Patient will demonstrate a 49% limitation on FOTO     Time  8    Period  Weeks    Status  New    Target Date  01/17/19            Plan - 12/22/18 1141    Clinical Impression Statement  Pt reports MD recommended F/U in 3 months. She reports improvement since last visit. Her hip flexion AROM is 90 degrees in hooklying, God tolerance to mat exercises today. Continues to demonstrate hypersensitivity in area of inclision and lateral hip. Gentle soft tissue performed to decrease pain. Discussed use of different textures for desensitization.     PT Next Visit Plan  review gentle hip strength HEP, review standing weight shifting, considergait training in the II bars. start standing hip flexion when able. Consider desensitization.     PT Home Exercise Plan  HHPT HEP: standing march, standing hip abduction, side steps at counter, mini squats. [facility] HEP: glut squeeze , bridge, ball squeeze, clam red band     Consulted and Agree with Plan of Care  Patient       Patient will benefit from skilled therapeutic intervention in order to improve the following deficits and impairments:  Abnormal gait, Pain, Postural dysfunction, Decreased activity tolerance, Decreased endurance, Decreased range of  motion, Decreased strength, Impaired perceived functional ability, Difficulty walking  Visit Diagnosis: Pain in right hip  Stiffness of right hip, not elsewhere classified  Other abnormalities of gait and mobility     Problem List Patient Active Problem List   Diagnosis Date Noted  . Status post total replacement of right hip 10/12/2018  . Unilateral primary osteoarthritis, right hip 09/12/2018  . Degenerative joint disease 05/30/2018  . Anal fissure 11/10/2017  . Rectal bleeding 11/10/2017  . Rectal pain 11/10/2017  . Inflammatory arthritis 10/13/2017  . Latent tuberculosis by blood test 10/13/2017  . Chest pain with high risk for cardiac etiology 08/11/2017  . Chronic diastolic CHF (congestive  heart failure), NYHA class 2 ([place]) 08/11/2017  . Fatigue due to treatment 01/09/2017  . NSTEMI (non-ST elevated myocardial infarction) ([place]) 11/01/2016  . Diabetes mellitus ([place]) 10/24/2015  . Non-insulin dependent type 2 diabetes mellitus ([place]) 10/24/2015  . Angina, class I ([place]) 04/22/2015  . DOE (dyspnea on exertion) 04/22/2015  . Leg edema, left 04/22/2015  . Accumulation of fluid in tissues 03/21/2014  . Vertigo, central 01/02/2014  . Vertigo 01/02/2014  . Limb pain- echymosis, pain Lt radial artery after cath 12/11/2013  . Chest pain with low risk for cardiac etiology- patent grafts on cath 11/26/13; ? microvascular ischemia 11/28/2013  . Dyslipidemia, goal LDL below 70 05/17/2013  . Accelerated hypertension with diastolic congestive heart failure, NYHA class 3 ([place]) 05/17/2013  . Claudication ([place]) 05/16/2013  . PTA/ Stent Rt popliteal and Rt peroneal artery 05/16/13 05/16/2013  . PAD,severe calcified pop and tibial peroneal trunk disease bilateraly   . ABDOMINAL PAIN RIGHT LOWER QUADRANT 10/07/2010  . CVA-STROKE 03/04/2010  . BARRETT'S ESOPHAGUS 03/04/2010  . FECAL INCONTINENCE 03/04/2010  . DM 01/14/2010  . ANEMIA OF CHRONIC DISEASE 01/14/2010  . Atherosclerotic heart disease of native coronary artery with angina pectoris ([place]) 01/14/2010  . DIVERTICULOSIS OF COLON 01/14/2010  . PERSONAL HISTORY OF COLONIC POLYPS 01/14/2010  . SHELLFISH ALLERGY 01/14/2010    Dorene Ar, PTA 12/22/2018, 11:45 AM  [facility] [address] [place], [name], [address] Phone: [phone]   Fax:  [phone]  Name: [name] MRN: [id number] Date of Birth: [birth year]

## 2018-12-26 ENCOUNTER — Encounter: Payer: Self-pay | Admitting: Physical Therapy

## 2018-12-26 ENCOUNTER — Ambulatory Visit: Payer: Medicare Other | Admitting: Physical Therapy

## 2018-12-26 DIAGNOSIS — M25651 Stiffness of right hip, not elsewhere classified: Secondary | ICD-10-CM

## 2018-12-26 DIAGNOSIS — R2689 Other abnormalities of gait and mobility: Secondary | ICD-10-CM | POA: Diagnosis not present

## 2018-12-26 DIAGNOSIS — M25551 Pain in right hip: Secondary | ICD-10-CM

## 2018-12-26 NOTE — Therapy (Signed)
[place], [name], [address] Phone: [phone]   Fax:  [phone]  Physical Therapy Treatment  Patient Details  Name: [name] MRN: [id number] Date of Birth: 1933-02-12 Referring Provider (PT): Dr Jean Rosenthal    Encounter Date: 12/26/2018  PT End of Session - 12/26/18 1405    Visit Number  7    Number of Visits  16    Date for PT Re-Evaluation  01/17/19    Authorization Type  Medicare triad     PT Start Time  1100    PT Stop Time  1142    PT Time Calculation (min)  42 min    Activity Tolerance  Patient tolerated treatment well    Behavior During Therapy  [facility] for tasks assessed/performed       Past Medical History:  Diagnosis Date  . Atherosclerotic heart disease native coronary artery w/angina pectoris ([place]) 1992   a. 1992 s/p PTCA of LAD;  b. 1998 CABG x 3; c. 07/2001 Cath: Sev LM/LAD/LCX dzs, 3/3 patent grafts; d. 11/2013 Cath: stable graft anatomy; e. 10/2016 Cath: native 3VD, VG->OM nl, LIMA->LAD nl, VG->Diag 55.  . Bradycardia, mild briefly to the 40s, asymptomatic 05/16/2013  . Carotid arterial disease ([place])    a. 05/2014 Carotid U/S: No signif bilat ICA stenosis, >60% L ECA.  Marland Kitchen Chronic anemia   . Chronic diastolic CHF (congestive heart failure) ([place])    a. 10/2016 Echo: Ef 55-60%, no rwma, Gr1 DD, triv AI, PASP 53mmHg, atrial septal aneurysm.  . Chronic diastolic CHF (congestive heart failure), NYHA class 2 ([place]) 08/11/2017  . DM (diabetes mellitus), type 2 with peripheral vascular complications (HCC)    On Oral medications.  . Essential hypertension   . GERD (gastroesophageal reflux disease)   . Hyperlipidemia with target LDL less than 70   . PAD (peripheral artery disease) ([place]) 05/2013; 09/2013   a. severe calcified POP-1 & TP Trunk Dz bilat;  b . 05/2013 Diamondback Rot Athrectomy (R POP) --> PTA R Pop, DES to R Peroneal;  c. 09/2013 PTA/Stenting of L Pop [name] - retrograde  access);  d. 04/2014 ABI's: R/L: 1.1/1.1.  Marland Kitchen Peptic ulcer disease   . S/P CABG x 3 1998   LIMA-LAD, SVG-OM, SVG-D1  . Severe claudication ([place]) 05/2013   Referred for Peripheral Angio (Dr. Gwenlyn Found --> Dr. Brunetta Jeans in [place])    Past Surgical History:  Procedure Laterality Date  . ATHERECTOMY Right 05/15/2013   Procedure: ATHERECTOMY;  Surgeon: Lorretta Harp, MD;  Location: [facility] CATH LAB;  Service: Cardiovascular;  Laterality: Right;  popliteal  . BACK SURGERY    . CARDIAC CATHETERIZATION  07/17/01   2 v CAD with LM, circ, LAD, obtuse diag, mod stenosis of diag vein graft , mild stenosis of marg vein graft, nl EF, medical treatment  . CARDIAC CATHETERIZATION  11/2013   Widely patent LIMA-LAD & SVG-OM with mild progression of proximal stenosis ~40-50% in SVG-D1; Widely patent native RCA with known severe native LCA disease  . CARDIAC CATHETERIZATION N/A 11/01/2016   Procedure: Left Heart Cath and Cors/Grafts Angiography;  Surgeon: [name], MD;  Location: [facility] CV LAB;  Service: Cardiovascular: Hemodynamics: High LVEDP!.  Cors/Grafts: LM 55%, o-p LAD 100% then after D1 -> LIMA-LAD patent, SVG-Diag ~55%. small RI wiht ~70% ostial. O-p Cx 80% - pCx 70% --> SVG-bifurcating OM patent. -- med Rx  . CORONARY ANGIOPLASTY  1992   PCI to LAD  .  CORONARY ARTERY BYPASS GRAFT  1998   LIMA-LAD, SVG-diagonal (none roughly 50% stenosis), SVG-OM  . LEFT HEART CATHETERIZATION WITH CORONARY ANGIOGRAM N/A 11/27/2013   Procedure: LEFT HEART CATHETERIZATION WITH CORONARY ANGIOGRAM;  Surgeon: [name], MD;  Location: [facility] CATH LAB;  Service: Cardiovascular;  Laterality: N/A;  . LOW EXTREMITY DOPPLERS/ABI  10/2016   Patent right SFA, popliteal and peroneal tendons. Patent left SFA and popliteal stent. 30-50% stenosis in the right common femoral and popliteal arteries, 50-74% stenosis in left profunda femoris artery. --> 1 year follow-up.  RABI - 1.2, LABI 1.1  . LOWER EXTREMITY ANGIOGRAM N/A 02/22/2013    Procedure: LOWER EXTREMITY ANGIOGRAM;  Surgeon: Lorretta Harp, MD;  Location: [facility] CATH LAB;  Service: Cardiovascular;  Laterality: N/A;  . LOWER EXTREMITY ANGIOGRAM N/A 07/20/2013   Procedure: LOWER EXTREMITY ANGIOGRAM;  Surgeon: Lorretta Harp, MD;  Location: [facility] CATH LAB;  Service: Cardiovascular;  Laterality: N/A;  . NM MYOVIEW LTD  04/03/2013; 5/26'/2016   Lexiscan: a)  Apical perfusion defect with no ischemia. Consider prior infarct versus breast attenuation.;; b) 5/'16: LOW RISK, Nl EF ~55-65%, No Ischemia /Infarction  . NM MYOVIEW LTD  08/12/2017   Multiple low RISK. Normal study. No EKG changes. 1.5 mm T wave inversions noted in V2-V4 that began 1 minute into stress. -Ended 3 minutes into rest.  EF 55-65%  . PERCUTANEOUS STENT INTERVENTION Right 05/15/2013   Procedure: PERCUTANEOUS STENT INTERVENTION;  Surgeon: Lorretta Harp, MD;  Location: [facility] CATH LAB;  Service: Cardiovascular;  Laterality: Right;  tibioperoneal trunk  . Peroneal Artery Stent  05/15/13   Diamondback rot. atherectomy of high grade segmental popliteal stenosis then Chocolate balloonPTA; then angiosculpt PTA of the prox peroneal with stent-Xpedition   . pv angio  07/20/13   high-grade calcified popliteal disease with one-vessel runoff via a small anterior tibial artery that has proximal disease as well, no intervention  . TOTAL HIP ARTHROPLASTY Right 10/12/2018   Procedure: RIGHT TOTAL HIP ARTHROPLASTY ANTERIOR APPROACH;  Surgeon: Mcarthur Rossetti, MD;  Location: [facility] ORS;  Service: Orthopedics;  Laterality: Right;  . TRANSTHORACIC ECHOCARDIOGRAM  10/2016   EF 55-60%. Gr 1 DD. Normal PAP. Aortic Sclerosis.  . TRANSTHORACIC ECHOCARDIOGRAM  02/2011; 04/2015   a) EF 50-55%, mild inf HK;; b)  Nl LV Size & Fxn EF 60-65%. mild LVH, Gr 1 DD.  . TUBAL LIGATION      There were no vitals filed for this visit.  Subjective Assessment - 12/26/18 1401    Subjective  Patient reports her hip is still sore but it is a little better.  She feels like she can feel the protheisis. Therapy assured her that if she could feel the protheisis she would be in a lot more pain and not be able to walk.     Limitations  Standing;Walking    How long can you stand comfortably?  Patient can stand for about 10-15 minutes     How long can you walk comfortably?  can walk limited distances around the clinic     Diagnostic tests  Nothing post-op     Patient Stated Goals  to be the best she can with her mobility     Currently in Pain?  No/denies                       [facility] Adult PT Treatment/Exercise - 12/26/18 0001      Ambulation/Gait   Gait Comments  Patient ambualte 7'  with single point cane and min guard. She reuqired mod cuing for proper gait pattern. No LOB with gait but a mild increase in pain.       Knee/Hip Exercises: Supine   Quad Sets Limitations  2x10     Short Arc Quad Sets Limitations  2x10     Hip Adduction Isometric  10 reps      Modalities   Modalities  Cryotherapy      Cryotherapy   Cryotherapy Location  Hip      Manual Therapy   Manual therapy comments  Supine soft tissue work to right anterior lateral thigh. Using pool noofdle     Passive ROM  gentle PROM              PT Education - 12/26/18 1402    Education Details  HEP, symptom mangement, edema mangement     Person(s) Educated  Patient    Methods  Explanation;Demonstration;Tactile cues;Verbal cues    Comprehension  Verbalized understanding;Returned demonstration;Verbal cues required;Tactile cues required;Need further instruction       PT Short Term Goals - 12/07/18 1439      PT SHORT TERM GOAL #1   Title  Patient will increase passive right hip flexion by 20 degrees     Baseline  per visual inspection improved but still limited     Time  4    Period  Weeks    Status  On-going      PT SHORT TERM GOAL #2   Title  Patient will increase gross right hip flexion to 4/5     Time  4    Period  Weeks    Status  On-going      PT  SHORT TERM GOAL #3   Title  Patient will ambualte 300' with a single point cane     Time  4    Period  Weeks    Status  On-going        PT Long Term Goals - 12/26/18 1409      PT LONG TERM GOAL #1   Title  Patient will stand for 1/2 hour ewithout self report of pain in order to perfrom ADL's     Time  8    Period  Weeks    Status  On-going      PT LONG TERM GOAL #2   Title  Patient will ambualte 3000' with LRAD without self report of pain in order to perfrom ADL's     Time  8    Period  Weeks    Status  On-going      PT LONG TERM GOAL #3   Title  Patient will demonstrate a 49% limitation on FOTO     Time  8    Period  Weeks    Status  On-going            Plan - 12/26/18 1406    Clinical Impression Statement  Patient continues to have signficat edema in her anterior leg. Therapy worked on edema management and reviewed with the patient. She has been afraid to touch the area because she feels it is the actual replacement. She was assured it is not. She was also advised to continue icing her hip. Per visual insepction her hip motion is improving. She started gait training with a cane today. She required mod cuing for proper technique. She reported increased pain compared to the walker.      History and Personal Factors relevant to  plan of care:  review CHF, RA, left hip arthritis    Clinical Presentation  Evolving    Clinical Decision Making  Moderate    Rehab Potential  Good    PT Frequency  2x / week    PT Duration  8 weeks    PT Treatment/Interventions  ADLs/Self Care Home Management;Cryotherapy;Electrical Stimulation;Iontophoresis 4mg /ml Dexamethasone;Ultrasound;DME Instruction;Moist Heat;Gait training;Stair training;Therapeutic activities;Therapeutic exercise;Neuromuscular re-education;Patient/family education;Manual techniques;Passive range of motion;Taping    PT Next Visit Plan  review gentle hip strength HEP, review standing weight shifting, considergait training in  the II bars. start standing hip flexion when able. Consider desensitization.     PT Home Exercise Plan  HHPT HEP: standing march, standing hip abduction, side steps at counter, mini squats. [facility] HEP: glut squeeze , bridge, ball squeeze, clam red band     Consulted and Agree with Plan of Care  Patient       Patient will benefit from skilled therapeutic intervention in order to improve the following deficits and impairments:  Abnormal gait, Pain, Postural dysfunction, Decreased activity tolerance, Decreased endurance, Decreased range of motion, Decreased strength, Impaired perceived functional ability, Difficulty walking  Visit Diagnosis: Pain in right hip  Stiffness of right hip, not elsewhere classified  Other abnormalities of gait and mobility     Problem List Patient Active Problem List   Diagnosis Date Noted  . Status post total replacement of right hip 10/12/2018  . Unilateral primary osteoarthritis, right hip 09/12/2018  . Degenerative joint disease 05/30/2018  . Anal fissure 11/10/2017  . Rectal bleeding 11/10/2017  . Rectal pain 11/10/2017  . Inflammatory arthritis 10/13/2017  . Latent tuberculosis by blood test 10/13/2017  . Chest pain with high risk for cardiac etiology 08/11/2017  . Chronic diastolic CHF (congestive heart failure), NYHA class 2 ([place]) 08/11/2017  . Fatigue due to treatment 01/09/2017  . NSTEMI (non-ST elevated myocardial infarction) ([place]) 11/01/2016  . Diabetes mellitus ([place]) 10/24/2015  . Non-insulin dependent type 2 diabetes mellitus ([place]) 10/24/2015  . Angina, class I ([place]) 04/22/2015  . DOE (dyspnea on exertion) 04/22/2015  . Leg edema, left 04/22/2015  . Accumulation of fluid in tissues 03/21/2014  . Vertigo, central 01/02/2014  . Vertigo 01/02/2014  . Limb pain- echymosis, pain Lt radial artery after cath 12/11/2013  . Chest pain with low risk for cardiac etiology- patent grafts on cath 11/26/13; ? microvascular ischemia 11/28/2013  .  Dyslipidemia, goal LDL below 70 05/17/2013  . Accelerated hypertension with diastolic congestive heart failure, NYHA class 3 ([place]) 05/17/2013  . Claudication ([place]) 05/16/2013  . PTA/ Stent Rt popliteal and Rt peroneal artery 05/16/13 05/16/2013  . PAD,severe calcified pop and tibial peroneal trunk disease bilateraly   . ABDOMINAL PAIN RIGHT LOWER QUADRANT 10/07/2010  . CVA-STROKE 03/04/2010  . BARRETT'S ESOPHAGUS 03/04/2010  . FECAL INCONTINENCE 03/04/2010  . DM 01/14/2010  . ANEMIA OF CHRONIC DISEASE 01/14/2010  . Atherosclerotic heart disease of native coronary artery with angina pectoris ([place]) 01/14/2010  . DIVERTICULOSIS OF COLON 01/14/2010  . PERSONAL HISTORY OF COLONIC POLYPS 01/14/2010  . SHELLFISH ALLERGY 01/14/2010    Carney Living  PT DPT 12/26/2018, 2:40 PM  [facility] [address] [place], [name], [address] Phone: [phone]   Fax:  [phone]  Name: [name] MRN: [id number] Date of Birth: Mar 20, 1933

## 2018-12-27 DIAGNOSIS — E118 Type 2 diabetes mellitus with unspecified complications: Secondary | ICD-10-CM | POA: Diagnosis not present

## 2018-12-27 DIAGNOSIS — I10 Essential (primary) hypertension: Secondary | ICD-10-CM | POA: Diagnosis not present

## 2018-12-28 ENCOUNTER — Encounter: Payer: Self-pay | Admitting: Physical Therapy

## 2018-12-28 ENCOUNTER — Ambulatory Visit: Payer: Medicare Other | Admitting: Physical Therapy

## 2018-12-28 DIAGNOSIS — R2689 Other abnormalities of gait and mobility: Secondary | ICD-10-CM

## 2018-12-28 DIAGNOSIS — M25651 Stiffness of right hip, not elsewhere classified: Secondary | ICD-10-CM

## 2018-12-28 DIAGNOSIS — M25551 Pain in right hip: Secondary | ICD-10-CM | POA: Diagnosis not present

## 2018-12-28 NOTE — Therapy (Signed)
[place], [name], [address] Phone: [phone]   Fax:  [phone]  Physical Therapy Treatment  Patient Details  Name: [name] MRN: [id number] Date of Birth: 1933-04-26 Referring Provider (PT): Dr Jean Rosenthal    Encounter Date: 12/28/2018  PT End of Session - 12/28/18 2047    Visit Number  8    Number of Visits  16    Date for PT Re-Evaluation  01/17/19    Authorization Type  Medicare triad     PT Start Time  1147    PT Stop Time  1238    PT Time Calculation (min)  51 min    Activity Tolerance  Patient tolerated treatment well    Behavior During Therapy  [facility] for tasks assessed/performed       Past Medical History:  Diagnosis Date  . Atherosclerotic heart disease native coronary artery w/angina pectoris ([place]) 1992   a. 1992 s/p PTCA of LAD;  b. 1998 CABG x 3; c. 07/2001 Cath: Sev LM/LAD/LCX dzs, 3/3 patent grafts; d. 11/2013 Cath: stable graft anatomy; e. 10/2016 Cath: native 3VD, VG->OM nl, LIMA->LAD nl, VG->Diag 55.  . Bradycardia, mild briefly to the 40s, asymptomatic 05/16/2013  . Carotid arterial disease ([place])    a. 05/2014 Carotid U/S: No signif bilat ICA stenosis, >60% L ECA.  Marland Kitchen Chronic anemia   . Chronic diastolic CHF (congestive heart failure) ([place])    a. 10/2016 Echo: Ef 55-60%, no rwma, Gr1 DD, triv AI, PASP 42mmHg, atrial septal aneurysm.  . Chronic diastolic CHF (congestive heart failure), NYHA class 2 ([place]) 08/11/2017  . DM (diabetes mellitus), type 2 with peripheral vascular complications (H[facility])    On Oral medications.  . Essential hypertension   . GERD (gastroesophageal reflux disease)   . Hyperlipidemia with target LDL less than 70   . PAD (peripheral artery disease) ([place]) 05/2013; 09/2013   a. severe calcified POP-1 & TP Trunk Dz bilat;  b . 05/2013 Diamondback Rot Athrectomy (R POP) --> PTA R Pop, DES to R Peroneal;  c. 09/2013 PTA/Stenting of L Pop [name] - retrograde  access);  d. 04/2014 ABI's: R/L: 1.1/1.1.  Marland Kitchen Peptic ulcer disease   . S/P CABG x 3 1998   LIMA-LAD, SVG-OM, SVG-D1  . Severe claudication ([place]) 05/2013   Referred for Peripheral Angio (Dr. Gwenlyn Found --> Dr. Brunetta Jeans in [place])    Past Surgical History:  Procedure Laterality Date  . ATHERECTOMY Right 05/15/2013   Procedure: ATHERECTOMY;  Surgeon: Lorretta Harp, MD;  Location: [facility] CATH LAB;  Service: Cardiovascular;  Laterality: Right;  popliteal  . BACK SURGERY    . CARDIAC CATHETERIZATION  07/17/01   2 v CAD with LM, circ, LAD, obtuse diag, mod stenosis of diag vein graft , mild stenosis of marg vein graft, nl EF, medical treatment  . CARDIAC CATHETERIZATION  11/2013   Widely patent LIMA-LAD & SVG-OM with mild progression of proximal stenosis ~40-50% in SVG-D1; Widely patent native RCA with known severe native LCA disease  . CARDIAC CATHETERIZATION N/A 11/01/2016   Procedure: Left Heart Cath and Cors/Grafts Angiography;  Surgeon: [name], MD;  Location: [facility] CV LAB;  Service: Cardiovascular: Hemodynamics: High LVEDP!.  Cors/Grafts: LM 55%, o-p LAD 100% then after D1 -> LIMA-LAD patent, SVG-Diag ~55%. small RI wiht ~70% ostial. O-p Cx 80% - pCx 70% --> SVG-bifurcating OM patent. -- med Rx  . CORONARY ANGIOPLASTY  1992   PCI to LAD  .  CORONARY ARTERY BYPASS GRAFT  1998   LIMA-LAD, SVG-diagonal (none roughly 50% stenosis), SVG-OM  . LEFT HEART CATHETERIZATION WITH CORONARY ANGIOGRAM N/A 11/27/2013   Procedure: LEFT HEART CATHETERIZATION WITH CORONARY ANGIOGRAM;  Surgeon: [name], MD;  Location: [facility] LAB;  Service: Cardiovascular;  Laterality: N/A;  . LOW EXTREMITY DOPPLERS/ABI  10/2016   Patent right SFA, popliteal and peroneal tendons. Patent left SFA and popliteal stent. 30-50% stenosis in the right common femoral and popliteal arteries, 50-74% stenosis in left profunda femoris artery. --> 1 year follow-up.  RABI - 1.2, LABI 1.1  . LOWER EXTREMITY ANGIOGRAM N/A 02/22/2013    Procedure: LOWER EXTREMITY ANGIOGRAM;  Surgeon: Lorretta Harp, MD;  Location: [facility] CATH LAB;  Service: Cardiovascular;  Laterality: N/A;  . LOWER EXTREMITY ANGIOGRAM N/A 07/20/2013   Procedure: LOWER EXTREMITY ANGIOGRAM;  Surgeon: Lorretta Harp, MD;  Location: [facility] CATH LAB;  Service: Cardiovascular;  Laterality: N/A;  . NM MYOVIEW LTD  04/03/2013; 5/26'/2016   Lexiscan: a)  Apical perfusion defect with no ischemia. Consider prior infarct versus breast attenuation.;; b) 5/'16: LOW RISK, Nl EF ~55-65%, No Ischemia /Infarction  . NM MYOVIEW LTD  08/12/2017   Multiple low RISK. Normal study. No EKG changes. 1.5 mm T wave inversions noted in V2-V4 that began 1 minute into stress. -Ended 3 minutes into rest.  EF 55-65%  . PERCUTANEOUS STENT INTERVENTION Right 05/15/2013   Procedure: PERCUTANEOUS STENT INTERVENTION;  Surgeon: Lorretta Harp, MD;  Location: [facility] CATH LAB;  Service: Cardiovascular;  Laterality: Right;  tibioperoneal trunk  . Peroneal Artery Stent  05/15/13   Diamondback rot. atherectomy of high grade segmental popliteal stenosis then Chocolate balloonPTA; then angiosculpt PTA of the prox peroneal with stent-Xpedition   . pv angio  07/20/13   high-grade calcified popliteal disease with one-vessel runoff via a small anterior tibial artery that has proximal disease as well, no intervention  . TOTAL HIP ARTHROPLASTY Right 10/12/2018   Procedure: RIGHT TOTAL HIP ARTHROPLASTY ANTERIOR APPROACH;  Surgeon: Mcarthur Rossetti, MD;  Location: [facility] ORS;  Service: Orthopedics;  Laterality: Right;  . TRANSTHORACIC ECHOCARDIOGRAM  10/2016   EF 55-60%. Gr 1 DD. Normal PAP. Aortic Sclerosis.  . TRANSTHORACIC ECHOCARDIOGRAM  02/2011; 04/2015   a) EF 50-55%, mild inf HK;; b)  Nl LV Size & Fxn EF 60-65%. mild LVH, Gr 1 DD.  . TUBAL LIGATION      There were no vitals filed for this visit.  Subjective Assessment - 12/28/18 1153    Subjective  Patient feels like her hip may be a little better. She still  feels like it is sore and swollen but she has been working on the swelling.     Limitations  Standing;Walking    How long can you stand comfortably?  Patient can stand for about 10-15 minutes     How long can you walk comfortably?  can walk limited distances around the clinic     Diagnostic tests  Nothing post-op     Patient Stated Goals  to be the best she can with her mobility     Currently in Pain?  Yes    Pain Score  2     Pain Location  Hip    Pain Orientation  Right;Anterior    Pain Descriptors / Indicators  Aching    Pain Type  Surgical pain    Pain Onset  More than a month ago    Pain Frequency  Constant    Aggravating  Factors   standing and walking     Pain Relieving Factors  rest     Effect of Pain on Daily Activities  difficulty perfroming ADL's                        [facility] Adult PT Treatment/Exercise - 12/28/18 0001      Ambulation/Gait   Gait Comments  60; with single point cane. Less cuing but still needed min guard.       Knee/Hip Exercises: Standing   Heel Raises Limitations  x10    Hip Flexion Limitations  standing march 2x10       Knee/Hip Exercises: Seated   Heel Slides  10 reps;2 sets    Other Seated Knee/Hip Exercises  clam shell x10 AROM, then x 10 with yellow band     Other Seated Knee/Hip Exercises  ll squeeze 2x10       Knee/Hip Exercises: Supine   Quad Sets Limitations  2x10     Short Arc Quad Sets Limitations  2x10     Hip Adduction Isometric  10 reps      Cryotherapy   Number Minutes Cryotherapy  10 Minutes    Cryotherapy Location  Hip    Type of Cryotherapy  Ice pack      Manual Therapy   Manual therapy comments  Supine soft tissue work to right anterior lateral thigh. Using pool noofdle     Passive ROM  gentle PROM              PT Education - 12/28/18 1407    Education Details  reviewed HEP and symptom mangement     Person(s) Educated  Patient    Methods  Explanation;Demonstration;Verbal cues;Tactile cues     Comprehension  Verbalized understanding;Returned demonstration;Verbal cues required;Tactile cues required       PT Short Term Goals - 12/07/18 1439      PT SHORT TERM GOAL #1   Title  Patient will increase passive right hip flexion by 20 degrees     Baseline  per visual inspection improved but still limited     Time  4    Period  Weeks    Status  On-going      PT SHORT TERM GOAL #2   Title  Patient will increase gross right hip flexion to 4/5     Time  4    Period  Weeks    Status  On-going      PT SHORT TERM GOAL #3   Title  Patient will ambualte 300' with a single point cane     Time  4    Period  Weeks    Status  On-going        PT Long Term Goals - 12/26/18 1409      PT LONG TERM GOAL #1   Title  Patient will stand for 1/2 hour ewithout self report of pain in order to perfrom ADL's     Time  8    Period  Weeks    Status  On-going      PT LONG TERM GOAL #2   Title  Patient will ambualte 3000' with LRAD without self report of pain in order to perfrom ADL's     Time  8    Period  Weeks    Status  On-going      PT LONG TERM GOAL #3   Title  Patient will demonstrate a 49% limitation on  FOTO     Time  8    Period  Weeks    Status  On-going            Plan - 12/28/18 2049    Clinical Impression Statement  Patient contineus to have significant swelling in her anterior thigh. She is having pain. Her range is progressing. Her strength is improving and she is walking with a cane in the clinic. Everything is progressing except pain and swelling. She was encouraged to continue icing and edema mangement at home.     Clinical Presentation  Evolving    Clinical Decision Making  Moderate    Rehab Potential  Good    PT Frequency  2x / week    PT Duration  8 weeks    PT Treatment/Interventions  ADLs/Self Care Home Management;Cryotherapy;Electrical Stimulation;Iontophoresis 4mg /ml Dexamethasone;Ultrasound;DME Instruction;Moist Heat;Gait training;Stair  training;Therapeutic activities;Therapeutic exercise;Neuromuscular re-education;Patient/family education;Manual techniques;Passive range of motion;Taping    PT Next Visit Plan  continue with gait training; continue with gentle flexion stretching; continue with standing exercises     PT Home Exercise Plan  [facility] HEP: standing march, standing hip abduction, side steps at counter, mini squats. [facility] HEP: glut squeeze , bridge, ball squeeze, clam red band     Consulted and Agree with Plan of Care  Patient       Patient will benefit from skilled therapeutic intervention in order to improve the following deficits and impairments:  Abnormal gait, Pain, Postural dysfunction, Decreased activity tolerance, Decreased endurance, Decreased range of motion, Decreased strength, Impaired perceived functional ability, Difficulty walking  Visit Diagnosis: Pain in right hip  Stiffness of right hip, not elsewhere classified  Other abnormalities of gait and mobility     Problem List Patient Active Problem List   Diagnosis Date Noted  . Status post total replacement of right hip 10/12/2018  . Unilateral primary osteoarthritis, right hip 09/12/2018  . Degenerative joint disease 05/30/2018  . Anal fissure 11/10/2017  . Rectal bleeding 11/10/2017  . Rectal pain 11/10/2017  . Inflammatory arthritis 10/13/2017  . Latent tuberculosis by blood test 10/13/2017  . Chest pain with high risk for cardiac etiology 08/11/2017  . Chronic diastolic CHF (congestive heart failure), NYHA class 2 ([place]) 08/11/2017  . Fatigue due to treatment 01/09/2017  . NSTEMI (non-ST elevated myocardial infarction) ([place]) 11/01/2016  . Diabetes mellitus ([place]) 10/24/2015  . Non-insulin dependent type 2 diabetes mellitus ([place]) 10/24/2015  . Angina, class I ([facility]) 04/22/2015  . DOE (dyspnea on exertion) 04/22/2015  . Leg edema, left 04/22/2015  . Accumulation of fluid in tissues 03/21/2014  . Vertigo, central 01/02/2014  . Vertigo  01/02/2014  . Limb pain- echymosis, pain Lt radial artery after cath 12/11/2013  . Chest pain with low risk for cardiac etiology- patent grafts on cath 11/26/13; ? microvascular ischemia 11/28/2013  . Dyslipidemia, goal LDL below 70 05/17/2013  . Accelerated hypertension with diastolic congestive heart failure, NYHA class 3 ([place]) 05/17/2013  . Claudication ([place]) 05/16/2013  . PTA/ Stent Rt popliteal and Rt peroneal artery 05/16/13 05/16/2013  . PAD,severe calcified pop and tibial peroneal trunk disease bilateraly   . ABDOMINAL PAIN RIGHT LOWER QUADRANT 10/07/2010  . CVA-STROKE 03/04/2010  . BARRETT'S ESOPHAGUS 03/04/2010  . FECAL INCONTINENCE 03/04/2010  . DM 01/14/2010  . ANEMIA OF CHRONIC DISEASE 01/14/2010  . Atherosclerotic heart disease of native coronary artery with angina pectoris ([place]) 01/14/2010  . DIVERTICULOSIS OF COLON 01/14/2010  . PERSONAL HISTORY OF COLONIC POLYPS 01/14/2010  . SHELLFISH ALLERGY  01/14/2010    Carney Living PT DPT  12/28/2018, 8:53 PM  [facility] [address] [place], [name], [address] Phone: [phone]   Fax:  [phone]  Name: [name] MRN: [id number] Date of Birth: 04-17-1933

## 2019-01-01 ENCOUNTER — Other Ambulatory Visit: Payer: Self-pay | Admitting: Podiatry

## 2019-01-02 ENCOUNTER — Ambulatory Visit: Payer: Medicare Other | Admitting: Physical Therapy

## 2019-01-02 ENCOUNTER — Encounter: Payer: Self-pay | Admitting: Physical Therapy

## 2019-01-02 DIAGNOSIS — M25551 Pain in right hip: Secondary | ICD-10-CM

## 2019-01-02 DIAGNOSIS — M25651 Stiffness of right hip, not elsewhere classified: Secondary | ICD-10-CM

## 2019-01-02 DIAGNOSIS — R2689 Other abnormalities of gait and mobility: Secondary | ICD-10-CM | POA: Diagnosis not present

## 2019-01-02 NOTE — Therapy (Signed)
[facility] [place], [name], [address] Phone: [phone]   Fax:  [phone]  Physical Therapy Treatment  Patient Details  Name: [name] MRN: [id number] Date of Birth: 1933/11/22 Referring Provider (PT): Dr Jean Rosenthal    Encounter Date: 01/02/2019  PT End of Session - 01/02/19 1246    Visit Number  9    Number of Visits  16    Date for PT Re-Evaluation  01/17/19    Authorization Type  Medicare triad     PT Start Time  1100    PT Stop Time  1150    PT Time Calculation (min)  50 min    Activity Tolerance  Patient tolerated treatment well    Behavior During Therapy  [facility] for tasks assessed/performed       Past Medical History:  Diagnosis Date  . Atherosclerotic heart disease native coronary artery w/angina pectoris ([place]) 1992   a. 1992 s/p PTCA of LAD;  b. 1998 CABG x 3; c. 07/2001 Cath: Sev LM/LAD/LCX dzs, 3/3 patent grafts; d. 11/2013 Cath: stable graft anatomy; e. 10/2016 Cath: native 3VD, VG->OM nl, LIMA->LAD nl, VG->Diag 55.  . Bradycardia, mild briefly to the 40s, asymptomatic 05/16/2013  . Carotid arterial disease ([place])    a. 05/2014 Carotid U/S: No signif bilat ICA stenosis, >60% L ECA.  Marland Kitchen Chronic anemia   . Chronic diastolic CHF (congestive heart failure) ([place])    a. 10/2016 Echo: Ef 55-60%, no rwma, Gr1 DD, triv AI, PASP 68mmHg, atrial septal aneurysm.  . Chronic diastolic CHF (congestive heart failure), NYHA class 2 ([employer]) 08/11/2017  . DM (diabetes mellitus), type 2 with peripheral vascular complications (HCC)    On Oral medications.  . Essential hypertension   . GERD (gastroesophageal reflux disease)   . Hyperlipidemia with target LDL less than 70   . PAD (peripheral artery disease) ([place]) 05/2013; 09/2013   a. severe calcified POP-1 & TP Trunk Dz bilat;  b . 05/2013 Diamondback Rot Athrectomy (R POP) --> PTA R Pop, DES to R Peroneal;  c. 09/2013 PTA/Stenting of L Pop [name] - retrograde  access);  d. 04/2014 ABI's: R/L: 1.1/1.1.  Marland Kitchen Peptic ulcer disease   . S/P CABG x 3 1998   LIMA-LAD, SVG-OM, SVG-D1  . Severe claudication ([place]) 05/2013   Referred for Peripheral Angio (Dr. Gwenlyn Found --> Dr. Brunetta Jeans in [place])    Past Surgical History:  Procedure Laterality Date  . ATHERECTOMY Right 05/15/2013   Procedure: ATHERECTOMY;  Surgeon: Lorretta Harp, MD;  Location: [facility] CATH LAB;  Service: Cardiovascular;  Laterality: Right;  popliteal  . BACK SURGERY    . CARDIAC CATHETERIZATION  07/17/01   2 v CAD with LM, circ, LAD, obtuse diag, mod stenosis of diag vein graft , mild stenosis of marg vein graft, nl EF, medical treatment  . CARDIAC CATHETERIZATION  11/2013   Widely patent LIMA-LAD & SVG-OM with mild progression of proximal stenosis ~40-50% in SVG-D1; Widely patent native RCA with known severe native LCA disease  . CARDIAC CATHETERIZATION N/A 11/01/2016   Procedure: Left Heart Cath and Cors/Grafts Angiography;  Surgeon: [name], MD;  Location: [place] CV LAB;  Service: Cardiovascular: Hemodynamics: High LVEDP!.  Cors/Grafts: LM 55%, o-p LAD 100% then after D1 -> LIMA-LAD patent, SVG-Diag ~55%. small RI wiht ~70% ostial. O-p Cx 80% - pCx 70% --> SVG-bifurcating OM patent. -- med Rx  . CORONARY ANGIOPLASTY  1992   PCI to LAD  .  CORONARY ARTERY BYPASS GRAFT  1998   LIMA-LAD, SVG-diagonal (none roughly 50% stenosis), SVG-OM  . LEFT HEART CATHETERIZATION WITH CORONARY ANGIOGRAM N/A 11/27/2013   Procedure: LEFT HEART CATHETERIZATION WITH CORONARY ANGIOGRAM;  Surgeon: [name], MD;  Location: [facility] CATH LAB;  Service: Cardiovascular;  Laterality: N/A;  . LOW EXTREMITY DOPPLERS/ABI  10/2016   Patent right SFA, popliteal and peroneal tendons. Patent left SFA and popliteal stent. 30-50% stenosis in the right common femoral and popliteal arteries, 50-74% stenosis in left profunda femoris artery. --> 1 year follow-up.  RABI - 1.2, LABI 1.1  . LOWER EXTREMITY ANGIOGRAM N/A 02/22/2013    Procedure: LOWER EXTREMITY ANGIOGRAM;  Surgeon: Lorretta Harp, MD;  Location: [facility] CATH LAB;  Service: Cardiovascular;  Laterality: N/A;  . LOWER EXTREMITY ANGIOGRAM N/A 07/20/2013   Procedure: LOWER EXTREMITY ANGIOGRAM;  Surgeon: Lorretta Harp, MD;  Location: [facility] CATH LAB;  Service: Cardiovascular;  Laterality: N/A;  . NM MYOVIEW LTD  04/03/2013; 5/26'/2016   Lexiscan: a)  Apical perfusion defect with no ischemia. Consider prior infarct versus breast attenuation.;; b) 5/'16: LOW RISK, Nl EF ~55-65%, No Ischemia /Infarction  . NM MYOVIEW LTD  08/12/2017   Multiple low RISK. Normal study. No EKG changes. 1.5 mm T wave inversions noted in V2-V4 that began 1 minute into stress. -Ended 3 minutes into rest.  EF 55-65%  . PERCUTANEOUS STENT INTERVENTION Right 05/15/2013   Procedure: PERCUTANEOUS STENT INTERVENTION;  Surgeon: Lorretta Harp, MD;  Location: [facility] CATH LAB;  Service: Cardiovascular;  Laterality: Right;  tibioperoneal trunk  . Peroneal Artery Stent  05/15/13   Diamondback rot. atherectomy of high grade segmental popliteal stenosis then Chocolate balloonPTA; then angiosculpt PTA of the prox peroneal with stent-Xpedition   . pv angio  07/20/13   high-grade calcified popliteal disease with one-vessel runoff via a small anterior tibial artery that has proximal disease as well, no intervention  . TOTAL HIP ARTHROPLASTY Right 10/12/2018   Procedure: RIGHT TOTAL HIP ARTHROPLASTY ANTERIOR APPROACH;  Surgeon: Mcarthur Rossetti, MD;  Location: [facility] ORS;  Service: Orthopedics;  Laterality: Right;  . TRANSTHORACIC ECHOCARDIOGRAM  10/2016   EF 55-60%. Gr 1 DD. Normal PAP. Aortic Sclerosis.  . TRANSTHORACIC ECHOCARDIOGRAM  02/2011; 04/2015   a) EF 50-55%, mild inf HK;; b)  Nl LV Size & Fxn EF 60-65%. mild LVH, Gr 1 DD.  . TUBAL LIGATION      There were no vitals filed for this visit.  Subjective Assessment - 01/02/19 1241    Subjective  Patint reports her hip is not as painful today but last night it  was unbearble. She continues to feel like there is something wron gwith her hip. She thinks her swelling is her protheisis. She was advised if she really thinks something is wrong she should go to the MD.     Limitations  Standing;Walking    How long can you stand comfortably?  Patient can stand for about 10-15 minutes     How long can you walk comfortably?  can walk limited distances around the clinic     Diagnostic tests  Nothing post-op     Patient Stated Goals  to be the best she can with her mobility     Currently in Pain?  Yes    Pain Score  2     Pain Location  Hip    Pain Orientation  Right;Anterior    Pain Descriptors / Indicators  Aching    Pain Type  Surgical  pain    Pain Onset  More than a month ago    Pain Frequency  Constant    Aggravating Factors   standing and walking     Pain Relieving Factors  rest     Effect of Pain on Daily Activities  difficulty perfroming ADL's                        [facility] Adult PT Treatment/Exercise - 01/02/19 0001      Ambulation/Gait   Gait Comments  80 ft with single point cane. supervsion and gait belt. Gait pattern became more step to as she fatigued       Cryotherapy   Number Minutes Cryotherapy  10 Minutes    Cryotherapy Location  Hip    Type of Cryotherapy  Ice pack      Manual Therapy   Manual therapy comments  Supine soft tissue work to right anterior lateral thigh. Using pool noofdle     Passive ROM  gentle PROM              PT Education - 01/02/19 1244    Education Details  reviewed HEP and symptom mangement     Person(s) Educated  Patient    Methods  Explanation;Demonstration;Tactile cues;Verbal cues    Comprehension  Verbalized understanding;Returned demonstration;Verbal cues required;Tactile cues required;Need further instruction       PT Short Term Goals - 01/02/19 1251      PT SHORT TERM GOAL #1   Title  Patient will increase passive right hip flexion by 20 degrees     Baseline  per visual  inspection improved but still limited     Time  4    Period  Weeks    Status  On-going      PT SHORT TERM GOAL #2   Title  Patient will increase gross right hip flexion to 4/5     Time  4    Period  Weeks    Status  On-going      PT SHORT TERM GOAL #3   Title  Patient will ambualte 8' with a single point cane     Time  4    Period  Weeks    Status  On-going        PT Long Term Goals - 12/26/18 1409      PT LONG TERM GOAL #1   Title  Patient will stand for 1/2 hour ewithout self report of pain in order to perfrom ADL's     Time  8    Period  Weeks    Status  On-going      PT LONG TERM GOAL #2   Title  Patient will ambualte 3000' with LRAD without self report of pain in order to perfrom ADL's     Time  8    Period  Weeks    Status  On-going      PT LONG TERM GOAL #3   Title  Patient will demonstrate a 49% limitation on FOTO     Time  8    Period  Weeks    Status  On-going            Plan - 01/02/19 1247    Clinical Impression Statement  Patient continues to be hyeprsensative to soft tosuvch to her hip. She has several areas of significant swelling. She was advised to rest over the week. Therapy worked on edema management and gentle PROM this  visit. Therapy also perfromed gait training which is improving. Patients Thursday visit was cancelled. She was advised to jusst rest, ice, walk, and perfrom quad setsuntil she is seen on Monday. If she is not better by [facility] will send her bac k to her MD for further evalautrion of swelling.     History and Personal Factors relevant to plan of care:  reviewe CHF, RA, left hip arthritis     Clinical Presentation  Evolving    Clinical Decision Making  Moderate    Rehab Potential  Good    PT Frequency  2x / week    PT Duration  8 weeks    PT Treatment/Interventions  ADLs/Self Care Home Management;Cryotherapy;Electrical Stimulation;Iontophoresis 4mg /ml Dexamethasone;Ultrasound;DME Instruction;Moist Heat;Gait training;Stair  training;Therapeutic activities;Therapeutic exercise;Neuromuscular re-education;Patient/family education;Manual techniques;Passive range of motion;Taping    PT Next Visit Plan  continue with gait training; continue with gentle flexion stretching; continue with standing exercises     PT Home Exercise Plan  [facility] HEP: standing march, standing hip abduction, side steps at counter, mini squats. [facility] HEP: glut squeeze , bridge, ball squeeze, clam red band     Consulted and Agree with Plan of Care  Patient       Patient will benefit from skilled therapeutic intervention in order to improve the following deficits and impairments:  Abnormal gait, Pain, Postural dysfunction, Decreased activity tolerance, Decreased endurance, Decreased range of motion, Decreased strength, Impaired perceived functional ability, Difficulty walking  Visit Diagnosis: Pain in right hip  Stiffness of right hip, not elsewhere classified  Other abnormalities of gait and mobility     Problem List Patient Active Problem List   Diagnosis Date Noted  . Status post total replacement of right hip 10/12/2018  . Unilateral primary osteoarthritis, right hip 09/12/2018  . Degenerative joint disease 05/30/2018  . Anal fissure 11/10/2017  . Rectal bleeding 11/10/2017  . Rectal pain 11/10/2017  . Inflammatory arthritis 10/13/2017  . Latent tuberculosis by blood test 10/13/2017  . Chest pain with high risk for cardiac etiology 08/11/2017  . Chronic diastolic CHF (congestive heart failure), NYHA class 2 ([place]) 08/11/2017  . Fatigue due to treatment 01/09/2017  . NSTEMI (non-ST elevated myocardial infarction) ([place]) 11/01/2016  . Diabetes mellitus ([place]) 10/24/2015  . Non-insulin dependent type 2 diabetes mellitus ([place]) 10/24/2015  . Angina, class I ([place]) 04/22/2015  . DOE (dyspnea on exertion) 04/22/2015  . Leg edema, left 04/22/2015  . Accumulation of fluid in tissues 03/21/2014  . Vertigo, central 01/02/2014  . Vertigo  01/02/2014  . Limb pain- echymosis, pain Lt radial artery after cath 12/11/2013  . Chest pain with low risk for cardiac etiology- patent grafts on cath 11/26/13; ? microvascular ischemia 11/28/2013  . Dyslipidemia, goal LDL below 70 05/17/2013  . Accelerated hypertension with diastolic congestive heart failure, NYHA class 3 ([place]) 05/17/2013  . Claudication ([place]) 05/16/2013  . PTA/ Stent Rt popliteal and Rt peroneal artery 05/16/13 05/16/2013  . PAD,severe calcified pop and tibial peroneal trunk disease bilateraly   . ABDOMINAL PAIN RIGHT LOWER QUADRANT 10/07/2010  . CVA-STROKE 03/04/2010  . BARRETT'S ESOPHAGUS 03/04/2010  . FECAL INCONTINENCE 03/04/2010  . DM 01/14/2010  . ANEMIA OF CHRONIC DISEASE 01/14/2010  . Atherosclerotic heart disease of native coronary artery with angina pectoris ([place]) 01/14/2010  . DIVERTICULOSIS OF COLON 01/14/2010  . PERSONAL HISTORY OF COLONIC POLYPS 01/14/2010  . SHELLFISH ALLERGY 01/14/2010    Carney Living PT DPT  01/02/2019, 12:52 PM  [facility] [id number]  [place], [name], [address] Phone: [phone]   Fax:  [phone]  Name: [name] MRN: [id number] Date of Birth: 06-20-1933

## 2019-01-04 ENCOUNTER — Encounter: Payer: Medicare Other | Admitting: Physical Therapy

## 2019-01-07 NOTE — Progress Notes (Signed)
Subjective: [name] presents today with painful, thick toenails 1-5 b/l that she cannot cut and which interfere with daily activities.  Pain is aggravated when wearing enclosed shoe gear.  Jani Gravel, MD is her PCP.    Current Outpatient Medications:  .  clopidogrel (PLAVIX) 75 MG tablet, Take 75 mg by mouth daily., Disp: , Rfl:  .  Coenzyme Q10 (CO Q 10 PO), Take 1 tablet by mouth every other day. , Disp: , Rfl:  .  ezetimibe (ZETIA) 10 MG tablet, Take 10 mg by mouth at bedtime. , Disp: , Rfl:  .  folic acid (FOLVITE) 1 MG tablet, Take 1 mg by mouth daily., Disp: , Rfl: 1 .  furosemide (LASIX) 20 MG tablet, Take 2 tablets (40 mg total) by mouth every morning., Disp: 180 tablet, Rfl: 3 .  glimepiride (AMARYL) 1 MG tablet, Take 2 mg by mouth daily with breakfast. , Disp: , Rfl: 12 .  HYDROcodone-acetaminophen (NORCO/VICODIN) 5-325 MG tablet, Take 1-2 tablets by mouth every 4 (four) hours as needed for moderate pain (pain score 4-6)., Disp: 40 tablet, Rfl: 0 .  isosorbide mononitrate (IMDUR) 60 MG 24 hr tablet, Take 1.5 tablets (90 mg total) by mouth daily. ., Disp: 135 tablet, Rfl: 3 .  LORazepam (ATIVAN) 0.5 MG tablet, Take 0.5 mg by mouth every 6 (six) hours as needed for anxiety or sleep. , Disp: , Rfl: 5 .  methocarbamol (ROBAXIN) 500 MG tablet, Take 1 tablet (500 mg total) by mouth every 6 (six) hours as needed for muscle spasms., Disp: 40 tablet, Rfl: 1 .  methotrexate (RHEUMATREX) 2.5 MG tablet, Take 10 mg by mouth every Friday. , Disp: , Rfl: 1 .  pantoprazole (PROTONIX) 40 MG tablet, Take 1 tablet (40 mg total) by mouth 2 (two) times daily before a meal., Disp: 60 tablet, Rfl: 1 .  Polyethyl Glycol-Propyl Glycol (SYSTANE ULTRA) 0.4-0.3 % SOLN, Place 1 drop into both eyes at bedtime., Disp: , Rfl:  .  predniSONE (DELTASONE) 5 MG tablet, Take 5 mg by mouth daily with breakfast. , Disp: , Rfl:  .  rosuvastatin (CRESTOR) 20 MG tablet, Take 20 mg by mouth at bedtime. , Disp: , Rfl:  .   sulfaSALAzine (AZULFIDINE) 500 MG EC tablet, Take 1,000 mg by mouth daily. , Disp: , Rfl: 3 .  carvedilol (COREG) 3.125 MG tablet, Take 1 tablet (3.125 mg total) by mouth 2 (two) times daily., Disp: 180 tablet, Rfl: 3 .  gabapentin (NEURONTIN) 300 MG capsule, TAKE 1 CAPSULE BY MOUTH THREE TIMES A DAY, Disp: 30 capsule, Rfl: 1 .  nitroGLYCERIN (NITROSTAT) 0.4 MG SL tablet, PLACE 1 TABLET (0.4 MG TOTAL) UNDER THE TONGUE EVERY 5 (FIVE) MINUTES AS NEEDED FOR CHEST PAIN., Disp: 25 tablet, Rfl: 6  Allergies  Allergen Reactions  . Fish Allergy Hives and Shortness Of Breath  . Ivp Dye [Iodinated Diagnostic Agents] Shortness Of Breath and Anaphylaxis  . Latex Anaphylaxis, Hives, Shortness Of Breath, Itching and Other (See Comments)    REACTION: wheezing  . Shellfish Allergy Anaphylaxis, Hives, Shortness Of Breath and Other (See Comments)    All seafood  . Shellfish-Derived Products Anaphylaxis and Hives  . Other Itching and Other (See Comments)    Patient reports allergy to perfumed detergents. Causes itching.  . Metformin Nausea Only and Nausea And Vomiting    Objective:  Vascular Examination: Capillary refill time immediate x 10 digits  Dorsalis pedis and Posterior tibial pulses 1/4 b/l  Digital hair absent x  10 digits  Skin temperature gradient WNL b/l  Dermatological Examination: Skin with normal turgor, texture and tone b/l  Toenails 1-5 b/l discolored, thick, dystrophic with subungual debris and pain with palpation to nailbeds due to thickness of nails.  Musculoskeletal: Muscle strength 5/5 to all LE muscle groups  No pain, crepitus or joint limitation noted with ROM.   Neurological: Sensation decreased with 10 gram monofilament.  Assessment: Painful onychomycosis toenails 1-5 b/l   Plan: 1. Toenails 1-5 b/l were debrided in length and girth without iatrogenic bleeding. 2. Patient to continue soft, supportive shoe gear 3. Patient to report any pedal injuries to medical  professional immediately. 4. Follow up 3 months. Patient/POA to call should there be a concern in the interim.

## 2019-01-08 ENCOUNTER — Ambulatory Visit: Payer: Medicare Other | Admitting: Physical Therapy

## 2019-01-08 DIAGNOSIS — R2689 Other abnormalities of gait and mobility: Secondary | ICD-10-CM | POA: Diagnosis not present

## 2019-01-08 DIAGNOSIS — M25551 Pain in right hip: Secondary | ICD-10-CM

## 2019-01-08 DIAGNOSIS — M25651 Stiffness of right hip, not elsewhere classified: Secondary | ICD-10-CM

## 2019-01-08 NOTE — Therapy (Addendum)
[place], [name], [address] Phone: [phone]   Fax:  [phone]  Physical Therapy Treatment  Patient Details  Name: [name] MRN: [id number] Date of Birth: [birth year] Referring Provider (PT): Dr Jean Rosenthal   Progress Note Reporting Period 11/23/2019 to 01/08/2019  See note below for Objective Data and Assessment of Progress/Goals.       Encounter Date: 01/08/2019  PT End of Session - 01/08/19 1203    Visit Number  10    Number of Visits  14    Date for PT Re-Evaluation  02/05/19    Authorization Type  Medicare triad     PT Start Time  1145    PT Stop Time  1226    PT Time Calculation (min)  41 min    Activity Tolerance  Patient tolerated treatment well    Behavior During Therapy  WFL for tasks assessed/performed       Past Medical History:  Diagnosis Date  . Atherosclerotic heart disease native coronary artery w/angina pectoris ([place]) 1992   a. 1992 s/p PTCA of LAD;  b. 1998 CABG x 3; c. 07/2001 Cath: Sev LM/LAD/LCX dzs, 3/3 patent grafts; d. 11/2013 Cath: stable graft anatomy; e. 10/2016 Cath: native 3VD, VG->OM nl, LIMA->LAD nl, VG->Diag 55.  . Bradycardia, mild briefly to the 40s, asymptomatic 05/16/2013  . Carotid arterial disease ([place])    a. 05/2014 Carotid U/S: No signif bilat ICA stenosis, >60% L ECA.  Marland Kitchen Chronic anemia   . Chronic diastolic CHF (congestive heart failure) ([place])    a. 10/2016 Echo: Ef 55-60%, no rwma, Gr1 DD, triv AI, PASP 42mmHg, atrial septal aneurysm.  . Chronic diastolic CHF (congestive heart failure), NYHA class 2 ([place]) 08/11/2017  . DM (diabetes mellitus), type 2 with peripheral vascular complications ([facility])    On Oral medications.  . Essential hypertension   . GERD (gastroesophageal reflux disease)   . Hyperlipidemia with target LDL less than 70   . PAD (peripheral artery disease) ([place]) 05/2013; 09/2013   a. severe calcified POP-1 & TP Trunk Dz bilat;   b . 05/2013 Diamondback Rot Athrectomy (R POP) --> PTA R Pop, DES to R Peroneal;  c. 09/2013 PTA/Stenting of L Pop [name] - retrograde access);  d. 04/2014 ABI's: R/L: 1.1/1.1.  Marland Kitchen Peptic ulcer disease   . S/P CABG x 3 1998   LIMA-LAD, SVG-OM, SVG-D1  . Severe claudication ([place]) 05/2013   Referred for Peripheral Angio (Dr. Gwenlyn Found --> Dr. Brunetta Jeans in [place])    Past Surgical History:  Procedure Laterality Date  . ATHERECTOMY Right 05/15/2013   Procedure: ATHERECTOMY;  Surgeon: Lorretta Harp, MD;  Location: [facility] CATH LAB;  Service: Cardiovascular;  Laterality: Right;  popliteal  . BACK SURGERY    . CARDIAC CATHETERIZATION  07/17/01   2 v CAD with LM, circ, LAD, obtuse diag, mod stenosis of diag vein graft , mild stenosis of marg vein graft, nl EF, medical treatment  . CARDIAC CATHETERIZATION  11/2013   Widely patent LIMA-LAD & SVG-OM with mild progression of proximal stenosis ~40-50% in SVG-D1; Widely patent native RCA with known severe native LCA disease  . CARDIAC CATHETERIZATION N/A 11/01/2016   Procedure: Left Heart Cath and Cors/Grafts Angiography;  Surgeon: [name], MD;  Location: [facility] CV LAB;  Service: Cardiovascular: Hemodynamics: High LVEDP!.  Cors/Grafts: LM 55%, o-p LAD 100% then after D1 -> LIMA-LAD patent, SVG-Diag ~55%. small RI wiht ~70% ostial. O-p  Cx 80% - pCx 70% --> SVG-bifurcating OM patent. -- med Rx  . CORONARY ANGIOPLASTY  1992   PCI to LAD  . CORONARY ARTERY BYPASS GRAFT  1998   LIMA-LAD, SVG-diagonal (none roughly 50% stenosis), SVG-OM  . LEFT HEART CATHETERIZATION WITH CORONARY ANGIOGRAM N/A 11/27/2013   Procedure: LEFT HEART CATHETERIZATION WITH CORONARY ANGIOGRAM;  Surgeon: [name], MD;  Location: [facility] CATH LAB;  Service: Cardiovascular;  Laterality: N/A;  . LOW EXTREMITY DOPPLERS/ABI  10/2016   Patent right SFA, popliteal and peroneal tendons. Patent left SFA and popliteal stent. 30-50% stenosis in the right common femoral and popliteal arteries,  50-74% stenosis in left profunda femoris artery. --> 1 year follow-up.  RABI - 1.2, LABI 1.1  . LOWER EXTREMITY ANGIOGRAM N/A 02/22/2013   Procedure: LOWER EXTREMITY ANGIOGRAM;  Surgeon: Lorretta Harp, MD;  Location: [facility] CATH LAB;  Service: Cardiovascular;  Laterality: N/A;  . LOWER EXTREMITY ANGIOGRAM N/A 07/20/2013   Procedure: LOWER EXTREMITY ANGIOGRAM;  Surgeon: Lorretta Harp, MD;  Location: [facility] CATH LAB;  Service: Cardiovascular;  Laterality: N/A;  . NM MYOVIEW LTD  04/03/2013; 5/26'/2016   Lexiscan: a)  Apical perfusion defect with no ischemia. Consider prior infarct versus breast attenuation.;; b) 5/'16: LOW RISK, Nl EF ~55-65%, No Ischemia /Infarction  . NM MYOVIEW LTD  08/12/2017   Multiple low RISK. Normal study. No EKG changes. 1.5 mm T wave inversions noted in V2-V4 that began 1 minute into stress. -Ended 3 minutes into rest.  EF 55-65%  . PERCUTANEOUS STENT INTERVENTION Right 05/15/2013   Procedure: PERCUTANEOUS STENT INTERVENTION;  Surgeon: Lorretta Harp, MD;  Location: [facility] CATH LAB;  Service: Cardiovascular;  Laterality: Right;  tibioperoneal trunk  . Peroneal Artery Stent  05/15/13   Diamondback rot. atherectomy of high grade segmental popliteal stenosis then Chocolate balloonPTA; then angiosculpt PTA of the prox peroneal with stent-Xpedition   . pv angio  07/20/13   high-grade calcified popliteal disease with one-vessel runoff via a small anterior tibial artery that has proximal disease as well, no intervention  . TOTAL HIP ARTHROPLASTY Right 10/12/2018   Procedure: RIGHT TOTAL HIP ARTHROPLASTY ANTERIOR APPROACH;  Surgeon: Mcarthur Rossetti, MD;  Location: [facility] ORS;  Service: Orthopedics;  Laterality: Right;  . TRANSTHORACIC ECHOCARDIOGRAM  10/2016   EF 55-60%. Gr 1 DD. Normal PAP. Aortic Sclerosis.  . TRANSTHORACIC ECHOCARDIOGRAM  02/2011; 04/2015   a) EF 50-55%, mild inf HK;; b)  Nl LV Size & Fxn EF 60-65%. mild LVH, Gr 1 DD.  . TUBAL LIGATION      There were no vitals filed  for this visit.                    [place] Adult PT Treatment/Exercise - 01/08/19 0001      Knee/Hip Exercises: Seated   Long Arc Quad Limitations  2x10     Other Seated Knee/Hip Exercises  setaed yellow band 2x10       Knee/Hip Exercises: Supine   Quad Sets Limitations  3x10    Short Arc Quad Sets Limitations  2x10       Manual Therapy   Manual therapy comments  Supine soft tissue work to right anterior lateral thigh. Gentle PROM into flexion and extension Using tiger tail        Objective taken on 01/08/2019   Hip flexion strength 3+/5  Abduction 3+/5  Adduction 3+/5   Standing hip flexion active motion improved per visual inspection   Passive hip  flexion: 107 degrees.          PT Short Term Goals - 01/02/19 1251      PT SHORT TERM GOAL #1   Title  Patient will increase passive right hip flexion by 20 degrees     Baseline  per visual inspection improved but still limited     Time  4    Period  Weeks    Status  On-going      PT SHORT TERM GOAL #2   Title  Patient will increase gross right hip flexion to 4/5     Time  4    Period  Weeks    Status  On-going      PT SHORT TERM GOAL #3   Title  Patient will ambualte 300' with a single point cane     Time  4    Period  Weeks    Status  On-going        PT Long Term Goals - 12/26/18 1409      PT LONG TERM GOAL #1   Title  Patient will stand for 1/2 hour ewithout self report of pain in order to perfrom ADL's     Time  8    Period  Weeks    Status  On-going      PT LONG TERM GOAL #2   Title  Patient will ambualte 3000' with LRAD without self report of pain in order to perfrom ADL's     Time  8    Period  Weeks    Status  On-going      PT LONG TERM GOAL #3   Title  Patient will demonstrate a 49% limitation on FOTO     Time  8    Period  Weeks    Status  On-going            Plan - 01/08/19 1223    Clinical Impression Statement  Patient has rested for about a week. She feels  like her pain has improved. She has been walking but not doing much else. Per palpation her sweeling is still significant but less then it was befroe. She was given 3 lite exercises to perfrom at home. She was also encouraged to walk. Her passive hip flexion has improved to 107 degrees. Her strength is still limited but overall has improved to 3+/5. Her hip flexion strength has improved significantly compared to intial evaluation She is having some difficulty with transportation. She will come 1x a week for 4 week   History and Personal Factors relevant to plan of care:  CHF, RA, left hip arthritis     Clinical Presentation  Evolving    Clinical Decision Making  Moderate    Rehab Potential  Good    PT Frequency  1x / week    PT Duration  4 weeks    PT Treatment/Interventions  ADLs/Self Care Home Management;Cryotherapy;Electrical Stimulation;Iontophoresis 4mg /ml Dexamethasone;Ultrasound;DME Instruction;Moist Heat;Gait training;Stair training;Therapeutic activities;Therapeutic exercise;Neuromuscular re-education;Patient/family education;Manual techniques;Passive range of motion;Taping    PT Next Visit Plan  continue gait training; continue slow progresssion back into exercises as tolerated     PT Home Exercise Plan  HHPT HEP: standing march, standing hip abduction, side steps at counter, mini squats. [facility] HEP: glut squeeze , bridge, ball squeeze, clam red band     Consulted and Agree with Plan of Care  Patient       Patient will benefit from skilled therapeutic intervention in order to improve the following  deficits and impairments:  Abnormal gait, Pain, Postural dysfunction, Decreased activity tolerance, Decreased endurance, Decreased range of motion, Decreased strength, Impaired perceived functional ability, Difficulty walking  Visit Diagnosis: Pain in right hip  Stiffness of right hip, not elsewhere classified  Other abnormalities of gait and mobility     Problem List Patient Active  Problem List   Diagnosis Date Noted  . Status post total replacement of right hip 10/12/2018  . Unilateral primary osteoarthritis, right hip 09/12/2018  . Degenerative joint disease 05/30/2018  . Anal fissure 11/10/2017  . Rectal bleeding 11/10/2017  . Rectal pain 11/10/2017  . Inflammatory arthritis 10/13/2017  . Latent tuberculosis by blood test 10/13/2017  . Chest pain with high risk for cardiac etiology 08/11/2017  . Chronic diastolic CHF (congestive heart failure), NYHA class 2 ([place]) 08/11/2017  . Fatigue due to treatment 01/09/2017  . NSTEMI (non-ST elevated myocardial infarction) ([place]) 11/01/2016  . Diabetes mellitus ([place]) 10/24/2015  . Non-insulin dependent type 2 diabetes mellitus ([place]) 10/24/2015  . Angina, class I ([place]) 04/22/2015  . DOE (dyspnea on exertion) 04/22/2015  . Leg edema, left 04/22/2015  . Accumulation of fluid in tissues 03/21/2014  . Vertigo, central 01/02/2014  . Vertigo 01/02/2014  . Limb pain- echymosis, pain Lt radial artery after cath 12/11/2013  . Chest pain with low risk for cardiac etiology- patent grafts on cath 11/26/13; ? microvascular ischemia 11/28/2013  . Dyslipidemia, goal LDL below 70 05/17/2013  . Accelerated hypertension with diastolic congestive heart failure, NYHA class 3 ([place]) 05/17/2013  . Claudication ([place]) 05/16/2013  . PTA/ Stent Rt popliteal and Rt peroneal artery 05/16/13 05/16/2013  . PAD,severe calcified pop and tibial peroneal trunk disease bilateraly   . ABDOMINAL PAIN RIGHT LOWER QUADRANT 10/07/2010  . CVA-STROKE 03/04/2010  . BARRETT'S ESOPHAGUS 03/04/2010  . FECAL INCONTINENCE 03/04/2010  . DM 01/14/2010  . ANEMIA OF CHRONIC DISEASE 01/14/2010  . Atherosclerotic heart disease of native coronary artery with angina pectoris ([place]) 01/14/2010  . DIVERTICULOSIS OF COLON 01/14/2010  . PERSONAL HISTORY OF COLONIC POLYPS 01/14/2010  . SHELLFISH ALLERGY 01/14/2010    Carney Living PT DPT  01/08/2019, 1:51 PM  [facility] [address] [place], [name], [address] Phone: [phone]   Fax:  [phone]  Name: BRITNEY NEWSTROM MRN: [id number] Date of Birth: 1933-08-31

## 2019-01-09 DIAGNOSIS — I10 Essential (primary) hypertension: Secondary | ICD-10-CM | POA: Diagnosis not present

## 2019-01-09 DIAGNOSIS — D649 Anemia, unspecified: Secondary | ICD-10-CM | POA: Diagnosis not present

## 2019-01-09 DIAGNOSIS — E118 Type 2 diabetes mellitus with unspecified complications: Secondary | ICD-10-CM | POA: Diagnosis not present

## 2019-01-09 DIAGNOSIS — E7800 Pure hypercholesterolemia, unspecified: Secondary | ICD-10-CM | POA: Diagnosis not present

## 2019-01-15 ENCOUNTER — Other Ambulatory Visit: Payer: Self-pay | Admitting: Orthopaedic Surgery

## 2019-01-16 ENCOUNTER — Other Ambulatory Visit (INDEPENDENT_AMBULATORY_CARE_PROVIDER_SITE_OTHER): Payer: Self-pay | Admitting: Orthopaedic Surgery

## 2019-01-16 ENCOUNTER — Ambulatory Visit: Payer: Medicare Other | Admitting: Cardiology

## 2019-01-16 MED ORDER — ACETAMINOPHEN-CODEINE #3 300-30 MG PO TABS: 1.0000 | ORAL_TABLET | Freq: Three times a day (TID) | ORAL | 0 refills | Status: DC | PRN

## 2019-01-17 ENCOUNTER — Encounter: Payer: Medicare Other | Admitting: Physical Therapy

## 2019-01-22 DIAGNOSIS — Z79899 Other long term (current) drug therapy: Secondary | ICD-10-CM | POA: Diagnosis not present

## 2019-01-22 DIAGNOSIS — Z13820 Encounter for screening for osteoporosis: Secondary | ICD-10-CM | POA: Diagnosis not present

## 2019-01-22 DIAGNOSIS — M1990 Unspecified osteoarthritis, unspecified site: Secondary | ICD-10-CM | POA: Diagnosis not present

## 2019-01-22 DIAGNOSIS — E1169 Type 2 diabetes mellitus with other specified complication: Secondary | ICD-10-CM | POA: Diagnosis not present

## 2019-01-22 DIAGNOSIS — M0609 Rheumatoid arthritis without rheumatoid factor, multiple sites: Secondary | ICD-10-CM | POA: Diagnosis not present

## 2019-01-22 DIAGNOSIS — M79643 Pain in unspecified hand: Secondary | ICD-10-CM | POA: Diagnosis not present

## 2019-01-22 DIAGNOSIS — M25551 Pain in right hip: Secondary | ICD-10-CM | POA: Diagnosis not present

## 2019-01-22 DIAGNOSIS — D649 Anemia, unspecified: Secondary | ICD-10-CM | POA: Diagnosis not present

## 2019-01-22 DIAGNOSIS — R7611 Nonspecific reaction to tuberculin skin test without active tuberculosis: Secondary | ICD-10-CM | POA: Diagnosis not present

## 2019-01-23 ENCOUNTER — Encounter: Payer: Self-pay | Admitting: Physical Therapy

## 2019-01-23 ENCOUNTER — Ambulatory Visit: Payer: Medicare Other | Attending: Orthopaedic Surgery | Admitting: Physical Therapy

## 2019-01-23 DIAGNOSIS — M25551 Pain in right hip: Secondary | ICD-10-CM | POA: Diagnosis not present

## 2019-01-23 DIAGNOSIS — R2689 Other abnormalities of gait and mobility: Secondary | ICD-10-CM | POA: Insufficient documentation

## 2019-01-23 DIAGNOSIS — M25651 Stiffness of right hip, not elsewhere classified: Secondary | ICD-10-CM

## 2019-01-23 NOTE — Therapy (Signed)
[place], [name], [address] Phone: [phone]   Fax:  [phone]  Physical Therapy Treatment  Patient Details  Name: [name] MRN: [id number] Date of Birth: [birth year] Referring Provider (PT): Dr Jean Rosenthal    Encounter Date: 01/23/2019  PT End of Session - 01/23/19 1128    Visit Number  11    Number of Visits  14    Date for PT Re-Evaluation  02/05/19    PT Start Time  1100    PT Stop Time  1152    PT Time Calculation (min)  52 min    Activity Tolerance  Patient tolerated treatment well    Behavior During Therapy  [facility] for tasks assessed/performed       Past Medical History:  Diagnosis Date  . Atherosclerotic heart disease native coronary artery w/angina pectoris ([place]) 1992   a. 1992 s/p PTCA of LAD;  b. 1998 CABG x 3; c. 07/2001 Cath: Sev LM/LAD/LCX dzs, 3/3 patent grafts; d. 11/2013 Cath: stable graft anatomy; e. 10/2016 Cath: native 3VD, VG->OM nl, LIMA->LAD nl, VG->Diag 55.  . Bradycardia, mild briefly to the 40s, asymptomatic 05/16/2013  . Carotid arterial disease ([place])    a. 05/2014 Carotid U/S: No signif bilat ICA stenosis, >60% L ECA.  Marland Kitchen Chronic anemia   . Chronic diastolic CHF (congestive heart failure) ([place])    a. 10/2016 Echo: Ef 55-60%, no rwma, Gr1 DD, triv AI, PASP 86mmHg, atrial septal aneurysm.  . Chronic diastolic CHF (congestive heart failure), NYHA class 2 ([place]) 08/11/2017  . DM (diabetes mellitus), type 2 with peripheral vascular complications (H[facility])    On Oral medications.  . Essential hypertension   . GERD (gastroesophageal reflux disease)   . Hyperlipidemia with target LDL less than 70   . PAD (peripheral artery disease) ([place]) 05/2013; 09/2013   a. severe calcified POP-1 & TP Trunk Dz bilat;  b . 05/2013 Diamondback Rot Athrectomy (R POP) --> PTA R Pop, DES to R Peroneal;  c. 09/2013 PTA/Stenting of L Pop [name] - retrograde access);  d. 04/2014 ABI's: R/L: 1.1/1.1.   Marland Kitchen Peptic ulcer disease   . S/P CABG x 3 1998   LIMA-LAD, SVG-OM, SVG-D1  . Severe claudication ([place]) 05/2013   Referred for Peripheral Angio (Dr. Gwenlyn Found --> Dr. Brunetta Jeans in [place])    Past Surgical History:  Procedure Laterality Date  . ATHERECTOMY Right 05/15/2013   Procedure: ATHERECTOMY;  Surgeon: Lorretta Harp, MD;  Location: [facility] CATH LAB;  Service: Cardiovascular;  Laterality: Right;  popliteal  . BACK SURGERY    . CARDIAC CATHETERIZATION  07/17/01   2 v CAD with LM, circ, LAD, obtuse diag, mod stenosis of diag vein graft , mild stenosis of marg vein graft, nl EF, medical treatment  . CARDIAC CATHETERIZATION  11/2013   Widely patent LIMA-LAD & SVG-OM with mild progression of proximal stenosis ~40-50% in SVG-D1; Widely patent native RCA with known severe native LCA disease  . CARDIAC CATHETERIZATION N/A 11/01/2016   Procedure: Left Heart Cath and Cors/Grafts Angiography;  Surgeon: [name], MD;  Location: [facility] CV LAB;  Service: Cardiovascular: Hemodynamics: High LVEDP!.  Cors/Grafts: LM 55%, o-p LAD 100% then after D1 -> LIMA-LAD patent, SVG-Diag ~55%. small RI wiht ~70% ostial. O-p Cx 80% - pCx 70% --> SVG-bifurcating OM patent. -- med Rx  . CORONARY ANGIOPLASTY  1992   PCI to LAD  . [place]  LIMA-LAD, SVG-diagonal (none roughly 50% stenosis), SVG-OM  . LEFT HEART CATHETERIZATION WITH CORONARY ANGIOGRAM N/A 11/27/2013   Procedure: LEFT HEART CATHETERIZATION WITH CORONARY ANGIOGRAM;  Surgeon: [name], MD;  Location: [facility] CATH LAB;  Service: Cardiovascular;  Laterality: N/A;  . LOW EXTREMITY DOPPLERS/ABI  10/2016   Patent right SFA, popliteal and peroneal tendons. Patent left SFA and popliteal stent. 30-50% stenosis in the right common femoral and popliteal arteries, 50-74% stenosis in left profunda femoris artery. --> 1 year follow-up.  RABI - 1.2, LABI 1.1  . LOWER EXTREMITY ANGIOGRAM N/A 02/22/2013   Procedure: LOWER EXTREMITY ANGIOGRAM;   Surgeon: Lorretta Harp, MD;  Location: [facility] CATH LAB;  Service: Cardiovascular;  Laterality: N/A;  . LOWER EXTREMITY ANGIOGRAM N/A 07/20/2013   Procedure: LOWER EXTREMITY ANGIOGRAM;  Surgeon: Lorretta Harp, MD;  Location: [facility] CATH LAB;  Service: Cardiovascular;  Laterality: N/A;  . NM MYOVIEW LTD  04/03/2013; 5/26'/2016   Lexiscan: a)  Apical perfusion defect with no ischemia. Consider prior infarct versus breast attenuation.;; b) 5/'16: LOW RISK, Nl EF ~55-65%, No Ischemia /Infarction  . NM MYOVIEW LTD  08/12/2017   Multiple low RISK. Normal study. No EKG changes. 1.5 mm T wave inversions noted in V2-V4 that began 1 minute into stress. -Ended 3 minutes into rest.  EF 55-65%  . PERCUTANEOUS STENT INTERVENTION Right 05/15/2013   Procedure: PERCUTANEOUS STENT INTERVENTION;  Surgeon: Lorretta Harp, MD;  Location: [facility] CATH LAB;  Service: Cardiovascular;  Laterality: Right;  tibioperoneal trunk  . Peroneal Artery Stent  05/15/13   Diamondback rot. atherectomy of high grade segmental popliteal stenosis then [place] balloonPTA; then angiosculpt PTA of the prox peroneal with stent-Xpedition   . pv angio  07/20/13   high-grade calcified popliteal disease with one-vessel runoff via a small anterior tibial artery that has proximal disease as well, no intervention  . TOTAL HIP ARTHROPLASTY Right 10/12/2018   Procedure: RIGHT TOTAL HIP ARTHROPLASTY ANTERIOR APPROACH;  Surgeon: Mcarthur Rossetti, MD;  Location: [facility] ORS;  Service: Orthopedics;  Laterality: Right;  . TRANSTHORACIC ECHOCARDIOGRAM  10/2016   EF 55-60%. Gr 1 DD. Normal PAP. Aortic Sclerosis.  . TRANSTHORACIC ECHOCARDIOGRAM  02/2011; 04/2015   a) EF 50-55%, mild inf HK;; b)  Nl LV Size & Fxn EF 60-65%. mild LVH, Gr 1 DD.  . TUBAL LIGATION      There were no vitals filed for this visit.  Subjective Assessment - 01/23/19 1125    Subjective  Patient reports it was just a little sore today. She feels like her swelling went down a little. She  still feels like the soft tissue around her scar is very very sensative. She countinues to work on desensitization. She comesi in walking better.     Limitations  Standing;Walking    How long can you stand comfortably?  Patient can stand for about 10-15 minutes     How long can you walk comfortably?  can walk limited distances around the clinic     Diagnostic tests  Nothing post-op     Patient Stated Goals  to be the best she can with her mobility     Currently in Pain?  Yes    Pain Score  2     Pain Location  Hip    Pain Orientation  Right    Pain Descriptors / Indicators  Aching    Pain Type  Surgical pain    Pain Onset  More than a month ago  Pain Frequency  Constant    Aggravating Factors   standing and walking     Pain Relieving Factors  rest     Effect of Pain on Daily Activities  difficulty perfroming ADL's                        [facility] Adult PT Treatment/Exercise - 01/23/19 0001      Knee/Hip Exercises: Standing   Heel Raises Limitations  x15    Forward Step Up  10 reps;Step Height: 2";Hand Hold: 2    Other Standing Knee Exercises  standing march 2x10       Knee/Hip Exercises: Seated   Long Arc Quad Limitations  2x10     Other Seated Knee/Hip Exercises  setaed yellow band 2x10     Other Seated Knee/Hip Exercises  ball squeeze 3x10       Knee/Hip Exercises: Supine   Quad Sets Limitations  3x10    Short Arc Quad Sets Limitations  2x10      Cryotherapy   Number Minutes Cryotherapy  10 Minutes    Cryotherapy Location  Hip    Type of Cryotherapy  Ice pack      Manual Therapy   Manual therapy comments  supine edema management; supine roller to anteriro quad              PT Education - 01/23/19 1127    Education Details  HEP, symptom mangement     Person(s) Educated  Patient    Methods  Explanation;Demonstration;Verbal cues;Tactile cues    Comprehension  Verbalized understanding;Returned demonstration;Verbal cues required;Tactile cues  required;Need further instruction       PT Short Term Goals - 01/23/19 1131      PT SHORT TERM GOAL #1   Title  Patient will increase passive right hip flexion by 20 degrees     Baseline  per visual inspection improved but still limited     Time  4    Status  On-going      PT SHORT TERM GOAL #2   Title  Patient will increase gross right hip flexion to 4/5     Baseline  continues to have weakness     Time  4    Period  Weeks    Status  On-going      PT SHORT TERM GOAL #3   Title  Patient will ambualte 300' with a single point cane     Time  4    Period  Weeks    Status  On-going        PT Long Term Goals - 12/26/18 1409      PT LONG TERM GOAL #1   Title  Patient will stand for 1/2 hour ewithout self report of pain in order to perfrom ADL's     Time  8    Period  Weeks    Status  On-going      PT LONG TERM GOAL #2   Title  Patient will ambualte 3000' with LRAD without self report of pain in order to perfrom ADL's     Time  8    Period  Weeks    Status  On-going      PT LONG TERM GOAL #3   Title  Patient will demonstrate a 49% limitation on FOTO     Time  8    Period  Weeks    Status  On-going  Plan - 01/23/19 1129    Clinical Impression Statement  Patient's swelling is better but still significant in her anterior quad. She has tednerenss to light soft tissue mobilization but had improved tolerance compared to the past. She tolerated exercises well today. Therapy woirked on satanding exercises with the patient. Therapy was able to perfrom stair training with thepatient today. She was fatigued witha 2 inch step.     Clinical Presentation  Evolving    Clinical Decision Making  Moderate    Rehab Potential  Good    PT Frequency  1x / week    PT Duration  4 weeks    PT Treatment/Interventions  ADLs/Self Care Home Management;Cryotherapy;Electrical Stimulation;Iontophoresis 4mg /ml Dexamethasone;Ultrasound;DME Instruction;Moist Heat;Gait training;Stair  training;Therapeutic activities;Therapeutic exercise;Neuromuscular re-education;Patient/family education;Manual techniques;Passive range of motion;Taping    PT Next Visit Plan  continue gait training; continue slow progresssion back into exercises as tolerated     PT Home Exercise Plan  [facility] HEP: standing march, standing hip abduction, side steps at counter, mini squats. [facility] HEP: glut squeeze , bridge, ball squeeze, clam red band     Consulted and Agree with Plan of Care  Patient       Patient will benefit from skilled therapeutic intervention in order to improve the following deficits and impairments:  Abnormal gait, Pain, Postural dysfunction, Decreased activity tolerance, Decreased endurance, Decreased range of motion, Decreased strength, Impaired perceived functional ability, Difficulty walking  Visit Diagnosis: Pain in right hip  Stiffness of right hip, not elsewhere classified  Other abnormalities of gait and mobility     Problem List Patient Active Problem List   Diagnosis Date Noted  . Status post total replacement of right hip 10/12/2018  . Unilateral primary osteoarthritis, right hip 09/12/2018  . Degenerative joint disease 05/30/2018  . Anal fissure 11/10/2017  . Rectal bleeding 11/10/2017  . Rectal pain 11/10/2017  . Inflammatory arthritis 10/13/2017  . Latent tuberculosis by blood test 10/13/2017  . Chest pain with high risk for cardiac etiology 08/11/2017  . Chronic diastolic CHF (congestive heart failure), NYHA class 2 ([place]) 08/11/2017  . Fatigue due to treatment 01/09/2017  . NSTEMI (non-ST elevated myocardial infarction) ([place]) 11/01/2016  . Diabetes mellitus ([place]) 10/24/2015  . Non-insulin dependent type 2 diabetes mellitus ([place]) 10/24/2015  . Angina, class I ([place]) 04/22/2015  . DOE (dyspnea on exertion) 04/22/2015  . Leg edema, left 04/22/2015  . Accumulation of fluid in tissues 03/21/2014  . Vertigo, central 01/02/2014  . Vertigo 01/02/2014  . Limb pain-  echymosis, pain Lt radial artery after cath 12/11/2013  . Chest pain with low risk for cardiac etiology- patent grafts on cath 11/26/13; ? microvascular ischemia 11/28/2013  . Dyslipidemia, goal LDL below 70 05/17/2013  . Accelerated hypertension with diastolic congestive heart failure, NYHA class 3 ([place]) 05/17/2013  . Claudication ([place]) 05/16/2013  . PTA/ Stent Rt popliteal and Rt peroneal artery 05/16/13 05/16/2013  . PAD,severe calcified pop and tibial peroneal trunk disease bilateraly   . ABDOMINAL PAIN RIGHT LOWER QUADRANT 10/07/2010  . CVA-STROKE 03/04/2010  . BARRETT'S ESOPHAGUS 03/04/2010  . FECAL INCONTINENCE 03/04/2010  . DM 01/14/2010  . ANEMIA OF CHRONIC DISEASE 01/14/2010  . Atherosclerotic heart disease of native coronary artery with angina pectoris ([place]) 01/14/2010  . DIVERTICULOSIS OF COLON 01/14/2010  . PERSONAL HISTORY OF COLONIC POLYPS 01/14/2010  . SHELLFISH ALLERGY 01/14/2010    Carney Living PT DPT  01/23/2019, 4:22 PM  [place], [name],  [address] Phone: [phone]   Fax:  [phone]  Name: [name] MRN: [id number] Date of Birth: 08-23-1933

## 2019-01-30 ENCOUNTER — Ambulatory Visit: Payer: Medicare Other | Admitting: Physical Therapy

## 2019-02-05 ENCOUNTER — Ambulatory Visit (INDEPENDENT_AMBULATORY_CARE_PROVIDER_SITE_OTHER): Payer: Medicare Other | Admitting: Cardiology

## 2019-02-05 ENCOUNTER — Encounter: Payer: Self-pay | Admitting: Cardiology

## 2019-02-05 VITALS — BP 125/69 | HR 84 | Ht 63.0 in | Wt 158.0 lb

## 2019-02-05 DIAGNOSIS — E785 Hyperlipidemia, unspecified: Secondary | ICD-10-CM | POA: Diagnosis not present

## 2019-02-05 DIAGNOSIS — R600 Localized edema: Secondary | ICD-10-CM

## 2019-02-05 DIAGNOSIS — I5030 Unspecified diastolic (congestive) heart failure: Secondary | ICD-10-CM

## 2019-02-05 DIAGNOSIS — I110 Hypertensive heart disease with heart failure: Secondary | ICD-10-CM | POA: Diagnosis not present

## 2019-02-05 DIAGNOSIS — R0609 Other forms of dyspnea: Secondary | ICD-10-CM | POA: Diagnosis not present

## 2019-02-05 DIAGNOSIS — D638 Anemia in other chronic diseases classified elsewhere: Secondary | ICD-10-CM

## 2019-02-05 DIAGNOSIS — R0600 Dyspnea, unspecified: Secondary | ICD-10-CM

## 2019-02-05 DIAGNOSIS — I25119 Atherosclerotic heart disease of native coronary artery with unspecified angina pectoris: Secondary | ICD-10-CM | POA: Diagnosis not present

## 2019-02-05 MED ORDER — FERROUS SULFATE 325 (65 FE) MG PO TABS: 325.0000 mg | ORAL_TABLET | Freq: Every day | ORAL | 3 refills | Status: DC

## 2019-02-05 MED ORDER — FUROSEMIDE 20 MG PO TABS: ORAL_TABLET | ORAL | 3 refills | Status: DC

## 2019-02-05 NOTE — Progress Notes (Signed)
PCP: Jani Gravel, MD  Clinic Note: Chief Complaint  Patient presents with  . Follow-up    Still notes swelling and dyspnea.  Less chest pain.  . Coronary Artery Disease  . Anemia  . Leg Swelling    HPI: [name] is a 85 y.o. female with a PMH notable for MV CAD-CABG, HTN-Heart Dz with HFpE as well as PAD (Dr. Gwenlyn Found) who presents for delayed f/u with me, but 2 month f/u from visit with PA.   I last saw CANNON QUINTON in Sept 2018 as a post hospital follow-up.  She had been evaluated for chest pain with a Myoview stress test that was nonischemic.  When I saw her she was noted confused about her chest discomfort and also noted back discomfort.  Her symptoms lasted all day.  Her symptom was reproducible on exam.  Was given NSAID taper. Euvolemic with no CHF symptoms.  Stable dyspnea.  Recent Hospitalizations:  Oct 12, 2018 - Hip Sgx  Studies Personally Reviewed - (if available, images/films reviewed: From Epic Chart or Care Everywhere) PreOp   Myoview 09/15/2018: LOW Risk.  EF 50-55%.  No ischemia or infarction.  Echo 09/14/2018: Normal LV size and function with vigorous EF 65 to 70%.  GR 1 DD.  Severe LA dilation (suggestive of probably worsening: GR 1 DD).  She was seen in September 2019 for preop evaluation for hip surgery.  She had an echocardiogram and Myoview done reviewed above.  Felt to be low risk for surgery and she had a right total hip arthroplasty.   She then saw [name], PA on 21 November (volume overload after her hip surgery.  She increased her Lasix dosing for 3 days and has diuresed about 4+ pounds.  Was converted to 40 mg daily Lasix.    Last seen by [name], PA on 29 November 2018 after increasing Imdur and Lasix.  Patient continued to have some edema and signs of volume overload.  Was told to appropriately increase Lasix to 40 mg with a few days of additional dosing.   Interval History: Eddie returns today still little bit concerned because she still feels like  she has fluid congestion.  She is not sure if she is also dealing with cold type symptoms but she just feels like she has swelling and shortness of breath.  She is having a hard time slowly recovering from her hip surgery.  She is doing walking, but she has not yet been able to do stuff by herself.  Still having to use a walker.  Somewhat frustrated about this.  She also indicates that she is been told that she was anemic, but has not had any recommendation of treatment going forward.  When she is notes mostly now though other than her pain that does not help as much in the left hip as the right which was operated on, is that she has burning tingling sensations in her feet more consistent with peripheral neuropathy.  She denies any significant PND or orthopnea, but does have the edema.  She says she felt much better after initially taking the Lasix that was prescribed earlier but that wore off. She has intermittent twinges in her chest off and on, but they do not seem to be overly concerning for her more with more so the dyspnea.  She just generally feels weak and tired as well.  She says that she did do better since increasing the Imdur dose with no more significant chest  discomfort symptoms.  She denies any rapid irregular heartbeats palpitations. No lightheadedness, dizziness, weakness or syncope/near syncope. No TIA/amaurosis fugax symptoms. No melena, hematochezia, hematuria, or epstaxis. No claudication.  ROS: A comprehensive was performed. Review of Systems  Constitutional: Positive for malaise/fatigue (Definitely notes the energy is not that great).  HENT: Negative for congestion and nosebleeds.   Respiratory: Positive for shortness of breath. Negative for cough and wheezing.   Cardiovascular: Positive for leg swelling.  Gastrointestinal: Negative for blood in stool and melena.  Genitourinary: Negative for hematuria.  Musculoskeletal: Negative for back pain and neck pain.  Neurological:  Positive for tingling (Mostly in her feet and ankles).  Psychiatric/Behavioral: Negative for memory loss. The patient is nervous/anxious.   All other systems reviewed and are negative.   I have reviewed and (if needed) personally updated the patient's problem list, medications, allergies, past medical and surgical history, social and family history.   Past Medical History:  Diagnosis Date  . Atherosclerotic heart disease native coronary artery w/angina pectoris ([place]) 1992   a. 1992 s/p PTCA of LAD;  b. 1998 CABG x 3; c. 07/2001 Cath: Sev LM/LAD/LCX dzs, 3/3 patent grafts; d. 11/2013 Cath: stable graft anatomy; e. 10/2016 Cath: native 3VD, VG->OM nl, LIMA->LAD nl, VG->Diag 55.  . Bradycardia, mild briefly to the 40s, asymptomatic 05/16/2013  . Carotid arterial disease ([place])    a. 05/2014 Carotid U/S: No signif bilat ICA stenosis, >60% L ECA.  Marland Kitchen Chronic anemia   . Chronic diastolic CHF (congestive heart failure) ([place])    a. 10/2016 Echo: Ef 55-60%, no rwma, Gr1 DD, triv AI, PASP 82mmHg, atrial septal aneurysm.  . Chronic diastolic CHF (congestive heart failure), NYHA class 2 ([place]) 08/11/2017  . DM (diabetes mellitus), type 2 with peripheral vascular complications (HCC)    On Oral medications.  . Essential hypertension   . GERD (gastroesophageal reflux disease)   . Hyperlipidemia with target LDL less than 70   . PAD (peripheral artery disease) ([place]) 05/2013; 09/2013   a. severe calcified POP-1 & TP Trunk Dz bilat;  b . 05/2013 Diamondback Rot Athrectomy (R POP) --> PTA R Pop, DES to R Peroneal;  c. 09/2013 PTA/Stenting of L Pop [name] - retrograde access);  d. 04/2014 ABI's: R/L: 1.1/1.1.  Marland Kitchen Peptic ulcer disease   . S/P CABG x 3 1998   LIMA-LAD, SVG-OM, SVG-D1  . Severe claudication ([place]) 05/2013   Referred for Peripheral Angio (Dr. Gwenlyn Found --> Dr. Brunetta Jeans in [place])    Past Surgical History:  Procedure Laterality Date  . ATHERECTOMY Right 05/15/2013   Procedure: ATHERECTOMY;  Surgeon:  Lorretta Harp, MD;  Location: [facility] CATH LAB;  Service: Cardiovascular;  Laterality: Right;  popliteal  . BACK SURGERY    . CARDIAC CATHETERIZATION  07/17/01   2 v CAD with LM, circ, LAD, obtuse diag, mod stenosis of diag vein graft , mild stenosis of marg vein graft, nl EF, medical treatment  . CARDIAC CATHETERIZATION  11/2013   Widely patent LIMA-LAD & SVG-OM with mild progression of proximal stenosis ~40-50% in SVG-D1; Widely patent native RCA with known severe native LCA disease  . CARDIAC CATHETERIZATION N/A 11/01/2016   Procedure: Left Heart Cath and Cors/Grafts Angiography;  Surgeon: [name], MD;  Location: [facility] CV LAB;  Service: Cardiovascular: Hemodynamics: High LVEDP!.  Cors/Grafts: LM 55%, o-p LAD 100% then after D1 -> LIMA-LAD patent, SVG-Diag ~55%. small RI wiht ~70% ostial. O-p Cx 80% - pCx 70% --> SVG-bifurcating OM  patent. -- med Rx  . CORONARY ANGIOPLASTY  1992   PCI to LAD  . CORONARY ARTERY BYPASS GRAFT  1998   LIMA-LAD, SVG-diagonal (none roughly 50% stenosis), SVG-OM  . LEFT HEART CATHETERIZATION WITH CORONARY ANGIOGRAM N/A 11/27/2013   Procedure: LEFT HEART CATHETERIZATION WITH CORONARY ANGIOGRAM;  Surgeon: [name], MD;  Location: [facility] CATH LAB;  Service: Cardiovascular;  Laterality: N/A;  . LOW EXTREMITY DOPPLERS/ABI  10/2016   Patent right SFA, popliteal and peroneal tendons. Patent left SFA and popliteal stent. 30-50% stenosis in the right common femoral and popliteal arteries, 50-74% stenosis in left profunda femoris artery. --> 1 year follow-up.  RABI - 1.2, LABI 1.1  . LOWER EXTREMITY ANGIOGRAM N/A 02/22/2013   Procedure: LOWER EXTREMITY ANGIOGRAM;  Surgeon: Lorretta Harp, MD;  Location: [facility] CATH LAB;  Service: Cardiovascular;  Laterality: N/A;  . LOWER EXTREMITY ANGIOGRAM N/A 07/20/2013   Procedure: LOWER EXTREMITY ANGIOGRAM;  Surgeon: Lorretta Harp, MD;  Location: [facility] C[facility] LAB;  Service: Cardiovascular;  Laterality: N/A;  . NM MYOVIEW LTD   04/03/2013; 5/26'/2016   Lexiscan: a)  Apical perfusion defect with no ischemia. Consider prior infarct versus breast attenuation.;; b) 5/'16: LOW RISK, Nl EF ~55-65%, No Ischemia /Infarction  . NM MYOVIEW LTD  09/2018   LOW RISK.  EF 55%.  No ischemia or infarction.  Marland Kitchen PERCUTANEOUS STENT INTERVENTION Right 05/15/2013   Procedure: PERCUTANEOUS STENT INTERVENTION;  Surgeon: Lorretta Harp, MD;  Location: [facility] CATH LAB;  Service: Cardiovascular;  Laterality: Right;  tibioperoneal trunk  . Peroneal Artery Stent  05/15/13   Diamondback rot. atherectomy of high grade segmental popliteal stenosis then Chocolate balloonPTA; then angiosculpt PTA of the prox peroneal with stent-Xpedition   . pv angio  07/20/13   high-grade calcified popliteal disease with one-vessel runoff via a small anterior tibial artery that has proximal disease as well, no intervention  . TOTAL HIP ARTHROPLASTY Right 10/12/2018   Procedure: RIGHT TOTAL HIP ARTHROPLASTY ANTERIOR APPROACH;  Surgeon: Mcarthur Rossetti, MD;  Location: [facility] ORS;  Service: Orthopedics;  Laterality: Right;  . TRANSTHORACIC ECHOCARDIOGRAM  10/2016   EF 55-60%. Gr 1 DD. Normal PAP. Aortic Sclerosis.  . TRANSTHORACIC ECHOCARDIOGRAM  09/2018   Normal LV size and function with vigorous EF 65 to 70%.  GR 1 DD.  Severe LA dilation (suggestive of probably worsening: GR 1 DD).  . TUBAL LIGATION      Current Meds  Medication Sig  . acetaminophen-codeine (TYLENOL #3) 300-30 MG tablet Take 1-2 tablets by mouth every 8 (eight) hours as needed for moderate pain.  . carvedilol (COREG) 3.125 MG tablet Take 1 tablet (3.125 mg total) by mouth 2 (two) times daily.  . clopidogrel (PLAVIX) 75 MG tablet Take 75 mg by mouth daily.  . Coenzyme Q10 (CO Q 10 PO) Take 1 tablet by mouth every other day.   . ezetimibe (ZETIA) 10 MG tablet Take 10 mg by mouth at bedtime.   . folic acid (FOLVITE) 1 MG tablet Take 1 mg by mouth daily.  Marland Kitchen gabapentin (NEURONTIN) 300 MG capsule TAKE 1  CAPSULE BY MOUTH THREE TIMES A DAY  . glimepiride (AMARYL) 1 MG tablet Take 2 mg by mouth daily with breakfast.   . HYDROcodone-acetaminophen (NORCO/VICODIN) 5-325 MG tablet Take 1-2 tablets by mouth every 4 (four) hours as needed for moderate pain (pain score 4-6).  . isosorbide mononitrate (IMDUR) 60 MG 24 hr tablet Take 1.5 tablets (90 mg total) by mouth daily. Marland Kitchen  Marland Kitchen  LORazepam (ATIVAN) 0.5 MG tablet Take 0.5 mg by mouth every 6 (six) hours as needed for anxiety or sleep.   . methocarbamol (ROBAXIN) 500 MG tablet Take 1 tablet (500 mg total) by mouth every 6 (six) hours as needed for muscle spasms.  . methotrexate (RHEUMATREX) 2.5 MG tablet Take 10 mg by mouth every Friday.   . nitroGLYCERIN (NITROSTAT) 0.4 MG SL tablet PLACE 1 TABLET (0.4 MG TOTAL) UNDER THE TONGUE EVERY 5 (FIVE) MINUTES AS NEEDED FOR CHEST PAIN.  Marland Kitchen pantoprazole (PROTONIX) 40 MG tablet Take 1 tablet (40 mg total) by mouth 2 (two) times daily before a meal.  . Polyethyl Glycol-Propyl Glycol (SYSTANE ULTRA) 0.4-0.3 % SOLN Place 1 drop into both eyes at bedtime.  . predniSONE (DELTASONE) 5 MG tablet Take 5 mg by mouth daily with breakfast.   . rosuvastatin (CRESTOR) 20 MG tablet Take 20 mg by mouth at bedtime.   . sulfaSALAzine (AZULFIDINE) 500 MG EC tablet Take 1,000 mg by mouth daily.   . [DISCONTINUED] furosemide (LASIX) 20 MG tablet Take 2 tablets (40 mg total) by mouth every morning.    Allergies  Allergen Reactions  . Fish Allergy Hives and Shortness Of Breath  . Ivp Dye [Iodinated Diagnostic Agents] Shortness Of Breath and Anaphylaxis  . Latex Anaphylaxis, Hives, Shortness Of Breath, Itching and Other (See Comments)    REACTION: wheezing  . Shellfish Allergy Anaphylaxis, Hives, Shortness Of Breath and Other (See Comments)    All seafood  . Shellfish-Derived Products Anaphylaxis and Hives  . Other Itching and Other (See Comments)    Patient reports allergy to perfumed detergents. Causes itching.  . Metformin Nausea  Only and Nausea And Vomiting   Social History   Tobacco Use  . Smoking status: Never Smoker  . Smokeless tobacco: Never Used  Substance Use Topics  . Alcohol use: No    Comment: rare glass of wine   . Drug use: No   Social History   Social History Narrative    She is a married mother of 71, grandmother of 71, great grandmother of 73.    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    She does not really get a lot of exercise,    She does not smoke or drink EtOH.    family history includes Hyperlipidemia in her father and mother; Hypertension in her father and mother.  Wt Readings from Last 3 Encounters:  02/05/19 158 lb (71.7 kg)  11/29/18 158 lb 9.6 oz (71.9 kg)  11/02/18 160 lb (72.6 kg)    PHYSICAL EXAM BP 125/69   Pulse 84   Ht 5\' 3"  (1.6 m)   Wt 158 lb (71.7 kg)   SpO2 95%   BMI 27.99 kg/m  Physical Exam  Constitutional: She is oriented to person, place, and time. She appears well-developed and well-nourished. No distress.  Relatively healthy-appearing  HENT:  Head: Normocephalic and atraumatic.  Mouth/Throat: No oropharyngeal exudate.  Neck: Normal range of motion. Neck supple. No JVD (Trivial) present.  Cardiovascular: Normal rate, regular rhythm, normal heart sounds and intact distal pulses. Exam reveals no friction rub.  No murmur heard. Chest pain on the right side of the chest reproducible with palpation  Pulmonary/Chest: Effort normal and breath sounds normal. No respiratory distress. She has no wheezes. She has no rales.  Abdominal: Soft. Bowel sounds are normal. She exhibits no distension. There is no abdominal tenderness. There is no rebound.  Musculoskeletal: Normal range of motion.  General: Edema (Trace to 1+ at the most) present.  Neurological: She is alert and oriented to person, place, and time.  Skin:  She has some darkening discoloration along her left leg incisions from her saphenous vein resection sites.  She is concerned  about the discoloration and here in the the lower part of her ankles.  Psychiatric: She has a normal mood and affect. Her behavior is normal. Judgment and thought content normal.  Vitals reviewed.    Adult ECG Report None  Other studies Reviewed: Additional studies/ records that were reviewed today include:  Recent Labs:   Lab Results  Component Value Date   CREATININE 0.88 12/04/2018   BUN 13 12/04/2018   NA 141 12/04/2018   K 3.9 12/04/2018   CL 99 12/04/2018   CO2 25 12/04/2018   June 2019: TC 174, TG 48, HDL 62, LDL 102.    CBC Latest Ref Rng & Units 10/15/2018 10/14/2018 10/13/2018  WBC 4.0 - 10.5 K/uL 7.4 7.8 5.5  Hemoglobin 12.0 - 15.0 g/dL 8.8(L) 9.0(L) 9.4(L)  Hematocrit 36.0 - 46.0 % 29.4(L) 30.4(L) 31.3(L)  Platelets 150 - 400 K/uL 185 186 176     ASSESSMENT / PLAN: Problem List Items Addressed This Visit    Accelerated hypertension with diastolic congestive heart failure, NYHA class 3 (HCC) - Primary (Chronic)    Blood pressure is actually well controlled on current dose of carvedilol and Imdur.  At this point now she is on a stable dose of Lasix but I think that she can maybe do 3 days a week of 2 tablets Lasix as opposed to just 1.  Maybe for 3 days she can take additional dose like she had done before and then go to every other day/3 days a week going forward.  Adequate afterload reduction at this time without ARB or ACE inhibitor.      Relevant Medications   furosemide (LASIX) 20 MG tablet   Anemia of chronic disease    She does have a history of rectal bleeding and anal fissure, but there is no frank blood noted size may be some with wiping.  May very well require GI evaluation.  She is currently on Plavix alone which could be stopped if necessary.  For now we will check an anemia panel and recommend oral iron supplementation.  With anemia CBC rechecked in short order.  Roughly 1 to 2 months.      Relevant Medications   ferrous sulfate 325 (65 FE) MG  tablet   Other Relevant Orders   Vitamin B12   Folate   Iron and TIBC   Ferritin   Vitamin B12 (Completed)   Folate (Completed)   Iron and TIBC (Completed)   Ferritin (Completed)   Atherosclerotic heart disease of native coronary artery with angina pectoris (HCC) (Chronic)    Recent Myoview was negative for ischemia.  Not really noticing any more angina after having had her Imdur dose increased.  I agree with Mr. [name], [place] that this is probably not true angina, but may be more related to volume overload. Continue current dose of Imdur, and intermittent increased dose of Lasix. Also need to treat anemia as this could be contributing some to her microvascular ischemia.  She is on carvedilol and Imdur along with statin, Zetia and Plavix.      Relevant Medications   furosemide (LASIX) 20 MG tablet   Other Relevant Orders   Vitamin B12   Folate   Iron and TIBC  Ferritin   Vitamin B12 (Completed)   Folate (Completed)   Iron and TIBC (Completed)   Ferritin (Completed)   Bilateral leg edema (Chronic)    Stable swelling.  I talked about the importance of wearing support stockings, foot elevation and we are adjusting her Lasix dose.      DOE (dyspnea on exertion) (Chronic)    She may still have some diastolic dysfunction related dyspnea, but I think is probably also related to her anemia.  We will check anemia panel and likely initiate iron replacement therapy.  This can be followed up with PCP as well.  She denies any frank melena, hematochezia or hematuria.  May require referral to GI medicine.      Dyslipidemia, goal LDL below 70 (Chronic)    Well-controlled labs as of 2018.  Most recent lipids I have are from June 2019 which now show the LDL having gone up to 102.  Probably need to titrate up to 40 mg of rosuvastatin.  However I would want to wait until we gotten out of the anemia stage.  Can discuss in follow-up      Relevant Medications   furosemide (LASIX) 20 MG  tablet      Current medicines are reviewed at length with the patient today. (+/- concerns) none The following changes have been made:I do think we probably need to start iron, but will check iron studies first. Reasonable to take additional dose of Lasix as noted.  Patient Instructions  Medication Instructions:   FOR THE NEXT 2 DAYS( Tuesday AND Wednesday) TAKE 40 MG OF LASIX ( FUROSEMIDE),  START TAKING ON  Monday- Wednesday-Friday  40 MG , AND ON Tuesday, Thursday , Saturday and Sunday TAKE 20 MG  LASIX.  WILL LET YOU KNOW IF YOU NEED TO PURCHASE IRON TABLETS OVER THE COUNTER -- 325 MG DAILY If you need a refill on your cardiac medications before your next appointment, please call your pharmacy.   Lab work: LABS TODAY - ANEMIA PANEL ( TOTAL IRON, TIBC, VIT B12, FOLATE  AND FERRITIN )  IN ONE MONTH  - COME TO THE OFFICE FOR ANOTHER SET OF ANEMIA PANEL --FOLLOW  INSTRUCTION ORANGE PAPER  If you have labs (blood work) drawn today and your tests are completely normal, you will receive your results only by: Marland Kitchen MyChart Message (if you have MyChart) OR . A paper copy in the mail If you have any lab test that is abnormal or we need to change your treatment, we will call you to review the results.  Testing/Procedures: NOT NEEDED  Follow-Up: At [facility], you and your health needs are our priority.  As part of our continuing mission to provide you with exceptional heart care, we have created designated Provider Care Teams.  These Care Teams include your primary Cardiologist (physician) and Advanced Practice Providers (APPs -  Physician Assistants and Nurse Practitioners) who all work together to provide you with the care you need, when you need it. You will need a follow up appointment in 6 months AUG 2020.  Please call our office 2 months in advance to schedule this appointment.  You may see [name], MDor one of the following Advanced Practice Providers on your designated Care  Team:   Your physician recommends that you schedule a follow-up appointment   IN 2 MONTHS WITH HAO MENG PA   Any Other Special Instructions Will Be Listed Below (If Applicable).    She will follow-up with [name], PA in  1 to 2 months to reassess anemia and dyspnea.  Have ordered iron panel and CBC for now as well as prior to follow-up.  We can also reassess her blood pressure and lipids/potentially titrating up rosuvastatin to 40 mg.  She will then follow-up with me in 6 months from now.  Studies Ordered:   Orders Placed This Encounter  Procedures  . Vitamin B12  . Folate  . Iron and TIBC  . Ferritin  . Vitamin B12  . Folate  . Iron and TIBC  . Ferritin      [name], M.D., M.S. Interventional Cardiologist   Pager # [phone] Phone # [phone] [address] [place], [place] [address]

## 2019-02-05 NOTE — Patient Instructions (Signed)
Medication Instructions:   FOR THE NEXT 2 DAYS( Tuesday AND Wednesday) TAKE 40 MG OF LASIX ( FUROSEMIDE),  START TAKING ON  Monday- Wednesday-Friday  40 MG , AND ON Tuesday, Thursday , Saturday and Sunday TAKE 20 MG  LASIX.  WILL LET YOU KNOW IF YOU NEED TO PURCHASE IRON TABLETS OVER THE COUNTER -- 325 MG DAILY If you need a refill on your cardiac medications before your next appointment, please call your pharmacy.   Lab work: LABS TODAY - ANEMIA PANEL ( TOTAL IRON, TIBC, VIT B12, FOLATE  AND FERRITIN )  IN ONE MONTH  - COME TO THE OFFICE FOR ANOTHER SET OF ANEMIA PANEL --FOLLOW  INSTRUCTION ORANGE PAPER  If you have labs (blood work) drawn today and your tests are completely normal, you will receive your results only by: Marland Kitchen MyChart Message (if you have MyChart) OR . A paper copy in the mail If you have any lab test that is abnormal or we need to change your treatment, we will call you to review the results.  Testing/Procedures: NOT NEEDED  Follow-Up: At [facility], you and your health needs are our priority.  As part of our continuing mission to provide you with exceptional heart care, we have created designated Provider Care Teams.  These Care Teams include your primary Cardiologist (physician) and Advanced Practice Providers (APPs -  Physician Assistants and Nurse Practitioners) who all work together to provide you with the care you need, when you need it. You will need a follow up appointment in 6 months AUG 2020.  Please call our office 2 months in advance to schedule this appointment.  You may see [name], MDor one of the following Advanced Practice Providers on your designated Care Team:   Your physician recommends that you schedule a follow-up appointment   IN 2 MONTHS WITH HAO MENG PA   Any Other Special Instructions Will Be Listed Below (If Applicable).

## 2019-02-06 ENCOUNTER — Encounter: Payer: Self-pay | Admitting: Cardiology

## 2019-02-06 ENCOUNTER — Ambulatory Visit: Payer: Medicare Other | Admitting: Physical Therapy

## 2019-02-06 DIAGNOSIS — R2689 Other abnormalities of gait and mobility: Secondary | ICD-10-CM

## 2019-02-06 DIAGNOSIS — M25551 Pain in right hip: Secondary | ICD-10-CM

## 2019-02-06 DIAGNOSIS — M25651 Stiffness of right hip, not elsewhere classified: Secondary | ICD-10-CM | POA: Diagnosis not present

## 2019-02-06 LAB — FERRITIN: Ferritin: 49 ng/mL (ref 15–150)

## 2019-02-06 LAB — IRON AND TIBC
IRON: 34 ug/dL (ref 27–139)
Iron Saturation: 11 % — ABNORMAL LOW (ref 15–55)
Total Iron Binding Capacity: 309 ug/dL (ref 250–450)
UIBC: 275 ug/dL (ref 118–369)

## 2019-02-06 LAB — FOLATE: Folate: >20 ng/mL (ref >3.0)

## 2019-02-06 LAB — VITAMIN B12: Vitamin B-12: 779 pg/mL (ref 232–1245)

## 2019-02-06 NOTE — Assessment & Plan Note (Signed)
Well-controlled labs as of 2018.  Most recent lipids I have are from June 2019 which now show the LDL having gone up to 102.  Probably need to titrate up to 40 mg of rosuvastatin.  However I would want to wait until we gotten out of the anemia stage.  Can discuss in follow-up

## 2019-02-06 NOTE — Assessment & Plan Note (Signed)
She does have a history of rectal bleeding and anal fissure, but there is no frank blood noted size may be some with wiping.  May very well require GI evaluation.  She is currently on Plavix alone which could be stopped if necessary.  For now we will check an anemia panel and recommend oral iron supplementation.  With anemia CBC rechecked in short order.  Roughly 1 to 2 months.

## 2019-02-06 NOTE — Assessment & Plan Note (Signed)
Blood pressure is actually well controlled on current dose of carvedilol and Imdur.  At this point now she is on a stable dose of Lasix but I think that she can maybe do 3 days a week of 2 tablets Lasix as opposed to just 1.  Maybe for 3 days she can take additional dose like she had done before and then go to every other day/3 days a week going forward.  Adequate afterload reduction at this time without ARB or ACE inhibitor.

## 2019-02-06 NOTE — Assessment & Plan Note (Signed)
Stable swelling.  I talked about the importance of wearing support stockings, foot elevation and we are adjusting her Lasix dose.

## 2019-02-06 NOTE — Assessment & Plan Note (Addendum)
Recent Myoview was negative for ischemia.  Not really noticing any more angina after having had her Imdur dose increased.  I agree with Mr. [name], [place] that this is probably not true angina, but may be more related to volume overload. Continue current dose of Imdur, and intermittent increased dose of Lasix. Also need to treat anemia as this could be contributing some to her microvascular ischemia.  She is on carvedilol and Imdur along with statin, Zetia and Plavix.

## 2019-02-06 NOTE — Assessment & Plan Note (Signed)
She may still have some diastolic dysfunction related dyspnea, but I think is probably also related to her anemia.  We will check anemia panel and likely initiate iron replacement therapy.  This can be followed up with PCP as well.  She denies any frank melena, hematochezia or hematuria.  May require referral to GI medicine.

## 2019-02-07 NOTE — Therapy (Signed)
[place], [name], [address] Phone: [phone]   Fax:  [phone]  Physical Therapy Treatment/Re-cert   Patient Details  Name: [name] MRN: [id number] Date of Birth: [birth year] Referring Provider (PT): Dr Jean Rosenthal    Encounter Date: 02/06/2019  PT End of Session - 02/06/19 1108    Visit Number  12    Number of Visits  18    Date for PT Re-Evaluation  03/20/19    Authorization Type  Medicare triad     PT Start Time  1100    PT Stop Time  1141    PT Time Calculation (min)  41 min    Activity Tolerance  Patient tolerated treatment well    Behavior During Therapy  [facility] for tasks assessed/performed       Past Medical History:  Diagnosis Date  . Atherosclerotic heart disease native coronary artery w/angina pectoris ([place]) 1992   a. 1992 s/p PTCA of LAD;  b. 1998 CABG x 3; c. 07/2001 Cath: Sev LM/LAD/LCX dzs, 3/3 patent grafts; d. 11/2013 Cath: stable graft anatomy; e. 10/2016 Cath: native 3VD, VG->OM nl, LIMA->LAD nl, VG->Diag 55.  . Bradycardia, mild briefly to the 40s, asymptomatic 05/16/2013  . Carotid arterial disease ([place])    a. 05/2014 Carotid U/S: No signif bilat ICA stenosis, >60% L ECA.  Marland Kitchen Chronic anemia   . Chronic diastolic CHF (congestive heart failure) ([place])    a. 10/2016 Echo: Ef 55-60%, no rwma, Gr1 DD, triv AI, PASP 34mmHg, atrial septal aneurysm.  . Chronic diastolic CHF (congestive heart failure), NYHA class 2 ([place]) 08/11/2017  . DM (diabetes mellitus), type 2 with peripheral vascular complications (HCC)    On Oral medications.  . Essential hypertension   . GERD (gastroesophageal reflux disease)   . Hyperlipidemia with target LDL less than 70   . PAD (peripheral artery disease) ([place]) 05/2013; 09/2013   a. severe calcified POP-1 & TP Trunk Dz bilat;  b . 05/2013 Diamondback Rot Athrectomy (R POP) --> PTA R Pop, DES to R Peroneal;  c. 09/2013 PTA/Stenting of L Pop [name] -  retrograde access);  d. 04/2014 ABI's: R/L: 1.1/1.1.  Marland Kitchen Peptic ulcer disease   . S/P CABG x 3 1998   LIMA-LAD, SVG-OM, SVG-D1  . Severe claudication ([place]) 05/2013   Referred for Peripheral Angio (Dr. Gwenlyn Found --> Dr. Brunetta Jeans in [place])    Past Surgical History:  Procedure Laterality Date  . ATHERECTOMY Right 05/15/2013   Procedure: ATHERECTOMY;  Surgeon: Lorretta Harp, MD;  Location: [facility] CATH LAB;  Service: Cardiovascular;  Laterality: Right;  popliteal  . BACK SURGERY    . CARDIAC CATHETERIZATION  07/17/01   2 v CAD with LM, circ, LAD, obtuse diag, mod stenosis of diag vein graft , mild stenosis of marg vein graft, nl EF, medical treatment  . CARDIAC CATHETERIZATION  11/2013   Widely patent LIMA-LAD & SVG-OM with mild progression of proximal stenosis ~40-50% in SVG-D1; Widely patent native RCA with known severe native LCA disease  . CARDIAC CATHETERIZATION N/A 11/01/2016   Procedure: Left Heart Cath and Cors/Grafts Angiography;  Surgeon: [name], MD;  Location: [facility] CV LAB;  Service: Cardiovascular: Hemodynamics: High LVEDP!.  Cors/Grafts: LM 55%, o-p LAD 100% then after D1 -> LIMA-LAD patent, SVG-Diag ~55%. small RI wiht ~70% ostial. O-p Cx 80% - pCx 70% --> SVG-bifurcating OM patent. -- med Rx  . CORONARY ANGIOPLASTY  1992   PCI to  LAD  . CORONARY ARTERY BYPASS GRAFT  1998   LIMA-LAD, SVG-diagonal (none roughly 50% stenosis), SVG-OM  . LEFT HEART CATHETERIZATION WITH CORONARY ANGIOGRAM N/A 11/27/2013   Procedure: LEFT HEART CATHETERIZATION WITH CORONARY ANGIOGRAM;  Surgeon: [name], MD;  Location: [facility] CATH LAB;  Service: Cardiovascular;  Laterality: N/A;  . LOW EXTREMITY DOPPLERS/ABI  10/2016   Patent right SFA, popliteal and peroneal tendons. Patent left SFA and popliteal stent. 30-50% stenosis in the right common femoral and popliteal arteries, 50-74% stenosis in left profunda femoris artery. --> 1 year follow-up.  RABI - 1.2, LABI 1.1  . LOWER EXTREMITY ANGIOGRAM  N/A 02/22/2013   Procedure: LOWER EXTREMITY ANGIOGRAM;  Surgeon: Lorretta Harp, MD;  Location: [facility] CATH LAB;  Service: Cardiovascular;  Laterality: N/A;  . LOWER EXTREMITY ANGIOGRAM N/A 07/20/2013   Procedure: LOWER EXTREMITY ANGIOGRAM;  Surgeon: Lorretta Harp, MD;  Location: [facility] CATH LAB;  Service: Cardiovascular;  Laterality: N/A;  . NM MYOVIEW LTD  04/03/2013; 5/26'/2016   Lexiscan: a)  Apical perfusion defect with no ischemia. Consider prior infarct versus breast attenuation.;; b) 5/'16: LOW RISK, Nl EF ~55-65%, No Ischemia /Infarction  . NM MYOVIEW LTD  09/2018   LOW RISK.  EF 55%.  No ischemia or infarction.  Marland Kitchen PERCUTANEOUS STENT INTERVENTION Right 05/15/2013   Procedure: PERCUTANEOUS STENT INTERVENTION;  Surgeon: Lorretta Harp, MD;  Location: [facility] CATH LAB;  Service: Cardiovascular;  Laterality: Right;  tibioperoneal trunk  . Peroneal Artery Stent  05/15/13   Diamondback rot. atherectomy of high grade segmental popliteal stenosis then Chocolate balloonPTA; then angiosculpt PTA of the prox peroneal with stent-Xpedition   . pv angio  07/20/13   high-grade calcified popliteal disease with one-vessel runoff via a small anterior tibial artery that has proximal disease as well, no intervention  . TOTAL HIP ARTHROPLASTY Right 10/12/2018   Procedure: RIGHT TOTAL HIP ARTHROPLASTY ANTERIOR APPROACH;  Surgeon: Mcarthur Rossetti, MD;  Location: [facility] ORS;  Service: Orthopedics;  Laterality: Right;  . TRANSTHORACIC ECHOCARDIOGRAM  10/2016   EF 55-60%. Gr 1 DD. Normal PAP. Aortic Sclerosis.  . TRANSTHORACIC ECHOCARDIOGRAM  09/2018   Normal LV size and function with vigorous EF 65 to 70%.  GR 1 DD.  Severe LA dilation (suggestive of probably worsening: GR 1 DD).  . TUBAL LIGATION      There were no vitals filed for this visit.  Subjective Assessment - 02/06/19 1124    Subjective  Patient feels like it is improving. Yesterday she flet very little pain. Today she feels more pain. She reports it is day  to day.     Limitations  Standing;Walking    How long can you stand comfortably?  Patient can stand for about 10-15 minutes     How long can you walk comfortably?  can walk limited distances around the clinic     Diagnostic tests  Nothing post-op     Patient Stated Goals  to be the best she can with her mobility     Currently in Pain?  Yes    Pain Score  2     Pain Location  Hip    Pain Orientation  Right    Pain Descriptors / Indicators  Aching    Pain Type  Surgical pain    Pain Onset  More than a month ago    Pain Frequency  Constant    Aggravating Factors   standing and walking     Pain Relieving Factors  rest  Effect of Pain on Daily Activities  difficulty perfroming ADL's          [facility] PT Assessment - 02/07/19 0001      Strength   Right Hip Flexion  4/5    Right Hip ABduction  4/5    Right Hip ADduction  4/5      Right Hip   Right Hip Flexion  105                   [facility] Adult PT Treatment/Exercise - 02/07/19 0001      Knee/Hip Exercises: Aerobic   Nustep  L2 x 5 minutes LE only       Knee/Hip Exercises: Standing   Heel Raises Limitations  x15    Other Standing Knee Exercises  standing march 2x10       Knee/Hip Exercises: Seated   Long Arc Quad Limitations  3x10     Other Seated Knee/Hip Exercises  setaed yellow band 2x10     Other Seated Knee/Hip Exercises  ball squeeze 3x10       Knee/Hip Exercises: Supine   Quad Sets Limitations  3x10    Short Arc Quad Sets Limitations  2x10      Cryotherapy   Number Minutes Cryotherapy  10 Minutes    Cryotherapy Location  Hip    Type of Cryotherapy  Ice pack      Manual Therapy   Manual therapy comments  supine edema management; supine roller to anteriro quad              PT Education - 02/07/19 1157    Person(s) Educated  Patient    Methods  Explanation;Demonstration;Tactile cues;Verbal cues    Comprehension  Verbalized understanding;Returned demonstration;Verbal cues required;Tactile cues  required       PT Short Term Goals - 01/23/19 1131      PT SHORT TERM GOAL #1   Title  Patient will increase passive right hip flexion by 20 degrees     Baseline  per visual inspection improved but still limited     Time  4    Status  On-going      PT SHORT TERM GOAL #2   Title  Patient will increase gross right hip flexion to 4/5     Baseline  continues to have weakness     Time  4    Period  Weeks    Status  On-going      PT SHORT TERM GOAL #3   Title  Patient will ambualte 300' with a single point cane     Time  4    Period  Weeks    Status  On-going        PT Long Term Goals - 12/26/18 1409      PT LONG TERM GOAL #1   Title  Patient will stand for 1/2 hour ewithout self report of pain in order to perfrom ADL's     Time  8    Period  Weeks    Status  On-going      PT LONG TERM GOAL #2   Title  Patient will ambualte 3000' with LRAD without self report of pain in order to perfrom ADL's     Time  8    Period  Weeks    Status  On-going      PT LONG TERM GOAL #3   Title  Patient will demonstrate a 49% limitation on FOTO     Time  8    Period  Weeks    Status  On-going            Plan - 02/07/19 1157    Clinical Impression Statement  Patient is making slow progress but she is makingprogress. she continues to have anterior soreness in her hip. Therapy continacted the MD regaurding the swelling in her hip. He is aware of the swellling and reports that it is expected for her age group. Therapy will continue to [facility] with the patient 1x a week for 6 weeks to continue to progress strength and decrease pain. She has improved strength and improved overrall hip motion. Spoke with patient who may still try to go in a little ealier to get her hip checked.     Clinical Presentation  Evolving    Clinical Decision Making  Moderate    Rehab Potential  Good    PT Frequency  1x / week    PT Duration  4 weeks    PT Treatment/Interventions  ADLs/Self Care Home  Management;Cryotherapy;Electrical Stimulation;Iontophoresis 4mg /ml Dexamethasone;Ultrasound;DME Instruction;Moist Heat;Gait training;Stair training;Therapeutic activities;Therapeutic exercise;Neuromuscular re-education;Patient/family education;Manual techniques;Passive range of motion;Taping    PT Next Visit Plan  continue gait training; continue slow progresssion back into exercises as tolerated     PT Home Exercise Plan  [facility] HEP: standing march, standing hip abduction, side steps at counter, mini squats. [facility] HEP: glut squeeze , bridge, ball squeeze, clam red band     Consulted and Agree with Plan of Care  Patient       Patient will benefit from skilled therapeutic intervention in order to improve the following deficits and impairments:  Abnormal gait, Pain, Postural dysfunction, Decreased activity tolerance, Decreased endurance, Decreased range of motion, Decreased strength, Impaired perceived functional ability, Difficulty walking  Visit Diagnosis: Pain in right hip - Plan: PT plan of care cert/re-cert  Stiffness of right hip, not elsewhere classified - Plan: PT plan of care cert/re-cert  Other abnormalities of gait and mobility - Plan: PT plan of care cert/re-cert     Problem List Patient Active Problem List   Diagnosis Date Noted  . Status post total replacement of right hip 10/12/2018  . Unilateral primary osteoarthritis, right hip 09/12/2018  . Degenerative joint disease 05/30/2018  . Anal fissure 11/10/2017  . Rectal bleeding 11/10/2017  . Rectal pain 11/10/2017  . Inflammatory arthritis 10/13/2017  . Latent tuberculosis by blood test 10/13/2017  . Chronic diastolic CHF (congestive heart failure), NYHA class 2 ([place]) 08/11/2017  . Fatigue due to treatment 01/09/2017  . NSTEMI (non-ST elevated myocardial infarction) ([place]) 11/01/2016  . Diabetes mellitus ([place]) 10/24/2015  . Non-insulin dependent type 2 diabetes mellitus ([place]) 10/24/2015  . Angina, class I ([place]) 04/22/2015   . DOE (dyspnea on exertion) 04/22/2015  . Bilateral leg edema 04/22/2015  . Accumulation of fluid in tissues 03/21/2014  . Vertigo, central 01/02/2014  . Vertigo 01/02/2014  . Limb pain- echymosis, pain Lt radial artery after cath 12/11/2013  . Dyslipidemia, goal LDL below 70 05/17/2013  . Accelerated hypertension with diastolic congestive heart failure, NYHA class 3 ([place]) 05/17/2013  . Claudication ([place]) 05/16/2013  . PTA/ Stent Rt popliteal and Rt peroneal artery 05/16/13 05/16/2013  . PAD,severe calcified pop and tibial peroneal trunk disease bilateraly   . ABDOMINAL PAIN RIGHT LOWER QUADRANT 10/07/2010  . CVA-STROKE 03/04/2010  . BARRETT'S ESOPHAGUS 03/04/2010  . FECAL INCONTINENCE 03/04/2010  . DM 01/14/2010  . Anemia of chronic disease 01/14/2010  . Atherosclerotic heart disease of native  coronary artery with angina pectoris ([place]) 01/14/2010  . DIVERTICULOSIS OF COLON 01/14/2010  . PERSONAL HISTORY OF COLONIC POLYPS 01/14/2010  . SHELLFISH ALLERGY 01/14/2010    Carney Living PT DPT  02/07/2019, 12:45 PM  [facility] [address] [place], [name], [address] Phone: [phone]   Fax:  [phone]  Name: [name] MRN: [id number] Date of Birth: 1933/06/22

## 2019-02-13 ENCOUNTER — Encounter: Payer: Self-pay | Admitting: Physical Therapy

## 2019-02-13 ENCOUNTER — Ambulatory Visit: Payer: Medicare Other | Attending: Orthopaedic Surgery | Admitting: Physical Therapy

## 2019-02-13 DIAGNOSIS — M25551 Pain in right hip: Secondary | ICD-10-CM | POA: Insufficient documentation

## 2019-02-13 DIAGNOSIS — M25651 Stiffness of right hip, not elsewhere classified: Secondary | ICD-10-CM

## 2019-02-13 DIAGNOSIS — R2689 Other abnormalities of gait and mobility: Secondary | ICD-10-CM

## 2019-02-13 NOTE — Therapy (Signed)
[place], [name], [address] Phone: [phone]   Fax:  [phone]  Physical Therapy Treatment  Patient Details  Name: [name] MRN: [id number] Date of Birth: [birth year] Referring Provider (PT): Dr Jean Rosenthal    Encounter Date: 02/13/2019  PT End of Session - 02/13/19 1154    Visit Number  13    Number of Visits  18    Date for PT Re-Evaluation  03/20/19    Authorization Type  Medicare triad     PT Start Time  1150    PT Stop Time  1238    PT Time Calculation (min)  48 min       Past Medical History:  Diagnosis Date  . Atherosclerotic heart disease native coronary artery w/angina pectoris ([place]) 1992   a. 1992 s/p PTCA of LAD;  b. 1998 CABG x 3; c. 07/2001 Cath: Sev LM/LAD/LCX dzs, 3/3 patent grafts; d. 11/2013 Cath: stable graft anatomy; e. 10/2016 Cath: native 3VD, VG->OM nl, LIMA->LAD nl, VG->Diag 55.  . Bradycardia, mild briefly to the 40s, asymptomatic 05/16/2013  . Carotid arterial disease ([place])    a. 05/2014 Carotid U/S: No signif bilat ICA stenosis, >60% L ECA.  Marland Kitchen Chronic anemia   . Chronic diastolic CHF (congestive heart failure) ([place])    a. 10/2016 Echo: Ef 55-60%, no rwma, Gr1 DD, triv AI, PASP 8mmHg, atrial septal aneurysm.  . Chronic diastolic CHF (congestive heart failure), NYHA class 2 ([place]) 08/11/2017  . DM (diabetes mellitus), type 2 with peripheral vascular complications (HCC)    On Oral medications.  . Essential hypertension   . GERD (gastroesophageal reflux disease)   . Hyperlipidemia with target LDL less than 70   . PAD (peripheral artery disease) ([place]) 05/2013; 09/2013   a. severe calcified POP-1 & TP Trunk Dz bilat;  b . 05/2013 Diamondback Rot Athrectomy (R POP) --> PTA R Pop, DES to R Peroneal;  c. 09/2013 PTA/Stenting of L Pop [name] - retrograde access);  d. 04/2014 ABI's: R/L: 1.1/1.1.  Marland Kitchen Peptic ulcer disease   . S/P CABG x 3 1998   LIMA-LAD, SVG-OM, SVG-D1  .  Severe claudication ([place]) 05/2013   Referred for Peripheral Angio (Dr. Gwenlyn Found --> Dr. Brunetta Jeans in [place])    Past Surgical History:  Procedure Laterality Date  . ATHERECTOMY Right 05/15/2013   Procedure: ATHERECTOMY;  Surgeon: Lorretta Harp, MD;  Location: [facility] CATH LAB;  Service: Cardiovascular;  Laterality: Right;  popliteal  . BACK SURGERY    . CARDIAC CATHETERIZATION  07/17/01   2 v CAD with LM, circ, LAD, obtuse diag, mod stenosis of diag vein graft , mild stenosis of marg vein graft, nl EF, medical treatment  . CARDIAC CATHETERIZATION  11/2013   Widely patent LIMA-LAD & SVG-OM with mild progression of proximal stenosis ~40-50% in SVG-D1; Widely patent native RCA with known severe native LCA disease  . CARDIAC CATHETERIZATION N/A 11/01/2016   Procedure: Left Heart Cath and Cors/Grafts Angiography;  Surgeon: [name], MD;  Location: [facility] CV LAB;  Service: Cardiovascular: Hemodynamics: High LVEDP!.  Cors/Grafts: LM 55%, o-p LAD 100% then after D1 -> LIMA-LAD patent, SVG-Diag ~55%. small RI wiht ~70% ostial. O-p Cx 80% - pCx 70% --> SVG-bifurcating OM patent. -- med Rx  . CORONARY ANGIOPLASTY  1992   PCI to LAD  . CORONARY ARTERY BYPASS GRAFT  1998   LIMA-LAD, SVG-diagonal (none roughly 50% stenosis), SVG-OM  . LEFT HEART  CATHETERIZATION WITH CORONARY ANGIOGRAM N/A 11/27/2013   Procedure: LEFT HEART CATHETERIZATION WITH CORONARY ANGIOGRAM;  Surgeon: [name], MD;  Location: [facility] CATH LAB;  Service: Cardiovascular;  Laterality: N/A;  . LOW EXTREMITY DOPPLERS/ABI  10/2016   Patent right SFA, popliteal and peroneal tendons. Patent left SFA and popliteal stent. 30-50% stenosis in the right common femoral and popliteal arteries, 50-74% stenosis in left profunda femoris artery. --> 1 year follow-up.  RABI - 1.2, LABI 1.1  . LOWER EXTREMITY ANGIOGRAM N/A 02/22/2013   Procedure: LOWER EXTREMITY ANGIOGRAM;  Surgeon: Lorretta Harp, MD;  Location: [facility] CATH LAB;  Service:  Cardiovascular;  Laterality: N/A;  . LOWER EXTREMITY ANGIOGRAM N/A 07/20/2013   Procedure: LOWER EXTREMITY ANGIOGRAM;  Surgeon: Lorretta Harp, MD;  Location: [facility] CATH LAB;  Service: Cardiovascular;  Laterality: N/A;  . NM MYOVIEW LTD  04/03/2013; 5/26'/2016   Lexiscan: a)  Apical perfusion defect with no ischemia. Consider prior infarct versus breast attenuation.;; b) 5/'16: LOW RISK, Nl EF ~55-65%, No Ischemia /Infarction  . NM MYOVIEW LTD  09/2018   LOW RISK.  EF 55%.  No ischemia or infarction.  Marland Kitchen PERCUTANEOUS STENT INTERVENTION Right 05/15/2013   Procedure: PERCUTANEOUS STENT INTERVENTION;  Surgeon: Lorretta Harp, MD;  Location: [facility] CATH LAB;  Service: Cardiovascular;  Laterality: Right;  tibioperoneal trunk  . Peroneal Artery Stent  05/15/13   Diamondback rot. atherectomy of high grade segmental popliteal stenosis then Chocolate balloonPTA; then angiosculpt PTA of the prox peroneal with stent-Xpedition   . pv angio  07/20/13   high-grade calcified popliteal disease with one-vessel runoff via a small anterior tibial artery that has proximal disease as well, no intervention  . TOTAL HIP ARTHROPLASTY Right 10/12/2018   Procedure: RIGHT TOTAL HIP ARTHROPLASTY ANTERIOR APPROACH;  Surgeon: Mcarthur Rossetti, MD;  Location: [facility] ORS;  Service: Orthopedics;  Laterality: Right;  . TRANSTHORACIC ECHOCARDIOGRAM  10/2016   EF 55-60%. Gr 1 DD. Normal PAP. Aortic Sclerosis.  . TRANSTHORACIC ECHOCARDIOGRAM  09/2018   Normal LV size and function with vigorous EF 65 to 70%.  GR 1 DD.  Severe LA dilation (suggestive of probably worsening: GR 1 DD).  . TUBAL LIGATION      There were no vitals filed for this visit.  Subjective Assessment - 02/13/19 1153    Subjective  This is one of those achey days.     Currently in Pain?  Yes                       [facility] Adult PT Treatment/Exercise - 02/13/19 0001      Knee/Hip Exercises: Aerobic   Nustep  L3 x 5 minutes       Knee/Hip Exercises:  Standing   Heel Raises Limitations  x20    Other Standing Knee Exercises  standing march 2x10       Knee/Hip Exercises: Seated   Long Arc Quad Limitations  3x10     Other Seated Knee/Hip Exercises  ball squeeze 3x10     Marching  20 reps      Knee/Hip Exercises: Supine   Bridges  10 reps    Other Supine Knee/Hip Exercises  ball squeeze x 20, clam x 10 red biateral x 10 single each       Cryotherapy   Number Minutes Cryotherapy  10 Minutes    Cryotherapy Location  Hip    Type of Cryotherapy  Ice pack      Manual Therapy  Manual therapy comments  Retro massage to right thigh                PT Short Term Goals - 01/23/19 1131      PT SHORT TERM GOAL #1   Title  Patient will increase passive right hip flexion by 20 degrees     Baseline  per visual inspection improved but still limited     Time  4    Status  On-going      PT SHORT TERM GOAL #2   Title  Patient will increase gross right hip flexion to 4/5     Baseline  continues to have weakness     Time  4    Period  Weeks    Status  On-going      PT SHORT TERM GOAL #3   Title  Patient will ambualte 300' with a single point cane     Time  4    Period  Weeks    Status  On-going        PT Long Term Goals - 12/26/18 1409      PT LONG TERM GOAL #1   Title  Patient will stand for 1/2 hour ewithout self report of pain in order to perfrom ADL's     Time  8    Period  Weeks    Status  On-going      PT LONG TERM GOAL #2   Title  Patient will ambualte 3000' with LRAD without self report of pain in order to perfrom ADL's     Time  8    Period  Weeks    Status  On-going      PT LONG TERM GOAL #3   Title  Patient will demonstrate a 49% limitation on FOTO     Time  8    Period  Weeks    Status  On-going            Plan - 02/13/19 1221    Clinical Impression Statement  Pt arrives reporting achiness. She reports walking without RW at home sometimes which causes pain in Right hip. Continued with gentle  therex for hip strength. Retro massage for edema. She is less sensitive to touch now and reports compliance with desensitiation at home. Encouraged more frequent ice packs at home with LE elevation.     PT Next Visit Plan  continue gait training; continue slow progresssion back into exercises as tolerated     PT Home Exercise Plan  HHPT HEP: standing march, standing hip abduction, side steps at counter, mini squats. [facility] HEP: glut squeeze , bridge, ball squeeze, clam red band     Consulted and Agree with Plan of Care  Patient       Patient will benefit from skilled therapeutic intervention in order to improve the following deficits and impairments:  Abnormal gait, Pain, Postural dysfunction, Decreased activity tolerance, Decreased endurance, Decreased range of motion, Decreased strength, Impaired perceived functional ability, Difficulty walking  Visit Diagnosis: Pain in right hip  Stiffness of right hip, not elsewhere classified  Other abnormalities of gait and mobility     Problem List Patient Active Problem List   Diagnosis Date Noted  . Status post total replacement of right hip 10/12/2018  . Unilateral primary osteoarthritis, right hip 09/12/2018  . Degenerative joint disease 05/30/2018  . Anal fissure 11/10/2017  . Rectal bleeding 11/10/2017  . Rectal pain 11/10/2017  . Inflammatory arthritis 10/13/2017  . Latent tuberculosis  by blood test 10/13/2017  . Chronic diastolic CHF (congestive heart failure), NYHA class 2 ([place]) 08/11/2017  . Fatigue due to treatment 01/09/2017  . NSTEMI (non-ST elevated myocardial infarction) ([place]) 11/01/2016  . Diabetes mellitus ([place]) 10/24/2015  . Non-insulin dependent type 2 diabetes mellitus ([place]) 10/24/2015  . Angina, class I ([place]) 04/22/2015  . DOE (dyspnea on exertion) 04/22/2015  . Bilateral leg edema 04/22/2015  . Accumulation of fluid in tissues 03/21/2014  . Vertigo, central 01/02/2014  . Vertigo 01/02/2014  . Limb pain- echymosis,  pain Lt radial artery after cath 12/11/2013  . Dyslipidemia, goal LDL below 70 05/17/2013  . Accelerated hypertension with diastolic congestive heart failure, NYHA class 3 ([place]) 05/17/2013  . Claudication ([place]) 05/16/2013  . PTA/ Stent Rt popliteal and Rt peroneal artery 05/16/13 05/16/2013  . PAD,severe calcified pop and tibial peroneal trunk disease bilateraly   . ABDOMINAL PAIN RIGHT LOWER QUADRANT 10/07/2010  . CVA-STROKE 03/04/2010  . BARRETT'S ESOPHAGUS 03/04/2010  . FECAL INCONTINENCE 03/04/2010  . DM 01/14/2010  . Anemia of chronic disease 01/14/2010  . Atherosclerotic heart disease of native coronary artery with angina pectoris ([place]) 01/14/2010  . DIVERTICULOSIS OF COLON 01/14/2010  . PERSONAL HISTORY OF COLONIC POLYPS 01/14/2010  . SHELLFISH ALLERGY 01/14/2010    Dorene Ar, PTA 02/13/2019, 12:31 PM  [facility] [address] [place], [name], [address] Phone: [phone]   Fax:  [phone]  Name: LORIN HAUCK MRN: [id number] Date of Birth: 1933-07-10

## 2019-02-20 ENCOUNTER — Ambulatory Visit: Payer: Medicare Other | Admitting: Physical Therapy

## 2019-02-20 DIAGNOSIS — R2689 Other abnormalities of gait and mobility: Secondary | ICD-10-CM

## 2019-02-20 DIAGNOSIS — M25651 Stiffness of right hip, not elsewhere classified: Secondary | ICD-10-CM | POA: Diagnosis not present

## 2019-02-20 DIAGNOSIS — M25551 Pain in right hip: Secondary | ICD-10-CM

## 2019-02-20 NOTE — Therapy (Addendum)
[place], [name], [address] Phone: [phone]   Fax:  [phone]  Physical Therapy Treatment/Discharge  Patient Details  Name: [name] MRN: [id number] Date of Birth: [birth year] Referring Provider (PT): Dr Jean Rosenthal    Encounter Date: 02/20/2019  PT End of Session - 02/20/19 1131    Visit Number  14    Number of Visits  18    Date for PT Re-Evaluation  03/20/19    Authorization Type  Medicare triad     PT Start Time  1122    PT Stop Time  1145    PT Time Calculation (min)  23 min    Activity Tolerance  Patient tolerated treatment well    Behavior During Therapy  [facility] for tasks assessed/performed       Past Medical History:  Diagnosis Date  . Atherosclerotic heart disease native coronary artery w/angina pectoris ([place]) 1992   a. 1992 s/p PTCA of LAD;  b. 1998 CABG x 3; c. 07/2001 Cath: Sev LM/LAD/LCX dzs, 3/3 patent grafts; d. 11/2013 Cath: stable graft anatomy; e. 10/2016 Cath: native 3VD, VG->OM nl, LIMA->LAD nl, VG->Diag 55.  . Bradycardia, mild briefly to the 40s, asymptomatic 05/16/2013  . Carotid arterial disease ([place])    a. 05/2014 Carotid U/S: No signif bilat ICA stenosis, >60% L ECA.  Marland Kitchen Chronic anemia   . Chronic diastolic CHF (congestive heart failure) ([place])    a. 10/2016 Echo: Ef 55-60%, no rwma, Gr1 DD, triv AI, PASP 78mHg, atrial septal aneurysm.  . Chronic diastolic CHF (congestive heart failure), NYHA class 2 ([place] 08/11/2017  . DM (diabetes mellitus), type 2 with peripheral vascular complications (HCC)    On Oral medications.  . Essential hypertension   . GERD (gastroesophageal reflux disease)   . Hyperlipidemia with target LDL less than 70   . PAD (per[place] artery disease) ([place] 05/2013; 09/2013   a. severe calcified POP-1 & TP Trunk Dz bilat;  b . 05/2013 Diamondback Rot Athrectomy (R POP) --> PTA R Pop, DES to R Peroneal;  c. 09/2013 PTA/Stenting of L Pop ([name]-  retrograde access);  d. 04/2014 ABI's: R/L: 1.1/1.1.  .Marland KitchenPeptic ulcer disease   . S/P CABG x 3 1998   LIMA-LAD, SVG-OM, SVG-D1  . Severe claudication ([place] 05/2013   Referred for Peripheral Angio (Dr. BGwenlyn Found--> [place]    Past Surgical History:  Procedure Laterality Date  . ATHERECTOMY Right 05/15/2013   Procedure: ATHERECTOMY;  Surgeon: JLorretta Harp MD;  [facility];  Service: Cardiovascular;  Laterality: Right;  popliteal  . BACK SURGERY    . CARDIAC CATHETERIZATION  07/17/01   2 v CAD with LM, circ, LAD, obtuse diag, mod stenosis of diag vein graft , mild stenosis of marg vein graft, nl EF, medical treatment  . CARDIAC CATHETERIZATION  11/2013   Widely patent LIMA-LAD & SVG-OM with mild progression of proximal stenosis ~40-50% in SVG-D1; Widely patent native RCA with known severe native LCA disease  . CARDIAC CATHETERIZATION N/A 11/01/2016   Procedure: Left Heart Cath and Cors/Grafts Angiography;  Surgeon: DLeonie Man MD;  [facility];  Service: Cardiovascular: Hemodynamics: High LVEDP!.  Cors/Grafts: LM 55%, o-p LAD 100% then after D1 -> LIMA-LAD patent, SVG-Diag ~55%. small RI wiht ~70% ostial. O-p Cx 80% - pCx 70% --> SVG-bifurcating OM patent. -- med Rx  . CORONARY ANGIOPLASTY  1992   PCI to LAD  .  CORONARY ARTERY BYPASS GRAFT  1998   LIMA-LAD, SVG-diagonal (none roughly 50% stenosis), SVG-OM  . LEFT HEART CATHETERIZATION WITH CORONARY ANGIOGRAM N/A 11/27/2013   Procedure: LEFT HEART CATHETERIZATION WITH CORONARY ANGIOGRAM;  Surgeon: [name], MD;  Location: [facility] CATH LAB;  Service: Cardiovascular;  Laterality: N/A;  . LOW EXTREMITY DOPPLERS/ABI  10/2016   Patent right SFA, popliteal and peroneal tendons. Patent left SFA and popliteal stent. 30-50% stenosis in the right common femoral and popliteal arteries, 50-74% stenosis in left profunda femoris artery. --> 1 year follow-up.  RABI - 1.2, LABI 1.1  . LOWER EXTREMITY ANGIOGRAM  N/A 02/22/2013   Procedure: LOWER EXTREMITY ANGIOGRAM;  Surgeon: Lorretta Harp, MD;  Location: [facility] CATH LAB;  Service: Cardiovascular;  Laterality: N/A;  . LOWER EXTREMITY ANGIOGRAM N/A 07/20/2013   Procedure: LOWER EXTREMITY ANGIOGRAM;  Surgeon: Lorretta Harp, MD;  Location[facility] CATH LAB;  Service: Cardiovascular;  Laterality: N/A;  . NM MYOVIEW LTD  04/03/2013; 5/26'/2016   Lexiscan: a)  Apical perfusion defect with no ischemia. Consider prior infarct versus breast attenuation.;; b) 5/'16: LOW RISK, Nl EF ~55-65%, No Ischemia /Infarction  . NM MYOVIEW LTD  09/2018   LOW RISK.  EF 55%.  No ischemia or infarction.  Marland Kitchen PERCUTANEOUS STENT INTERVENTION Right 05/15/2013   Procedure: PERCUTANEOUS STENT INTERVENTION;  Surgeon: Lorretta Harp, MD;  Location: [facility] CATH LAB;  Service: Cardiovascular;  Laterality: Right;  tibioperoneal trunk  . Peroneal Artery Stent  05/15/13   Diamondback rot. atherectomy of high grade segmental popliteal stenosis then Chocolate balloonPTA; then angiosculpt PTA of the prox peroneal with stent-Xpedition   . pv angio  07/20/13   high-grade calcified popliteal disease with one-vessel runoff via a small anterior tibial artery that has proximal disease as well, no intervention  . TOTAL HIP ARTHROPLASTY Right 10/12/2018   Procedure: RIGHT TOTAL HIP ARTHROPLASTY ANTERIOR APPROACH;  Surgeon: Mcarthur Rossetti, MD;  [facility] ORS;  Service: Orthopedics;  Laterality: Right;  . TRANSTHORACIC ECHOCARDIOGRAM  10/2016   EF 55-60%. Gr 1 DD. Normal PAP. Aortic Sclerosis.  . TRANSTHORACIC ECHOCARDIOGRAM  09/2018   Normal LV size and function with vigorous EF 65 to 70%.  GR 1 DD.  Severe LA dilation (suggestive of probably worsening: GR 1 DD).  . TUBAL LIGATION      There were no vitals filed for this visit.  Subjective Assessment - 02/20/19 1142    Subjective  Patient reports she is a little sore because she kept going in and out looking for the scat bus. She reports it is getting  better but still hurts ,     How long can you stand comfortably?  Patient can stand for about 10-15 minutes     How long can you walk comfortably?  can walk limited distances around the clinic     Diagnostic tests  Nothing post-op     Patient Stated Goals  to be the best she can with her mobility     Currently in Pain?  Yes    Pain Score  2     Pain Onset  More than a month ago    Pain Frequency  Constant                       [facility] Adult PT Treatment/Exercise - 02/20/19 0001      Knee/Hip Exercises: Standing   Heel Raises Limitations  x20    Abduction Limitations  x15 right  Extension Limitations  x15 right     Other Standing Knee Exercises  step up 2inches x10     Other Standing Knee Exercises  standing march 2x10       Knee/Hip Exercises: Seated   Long Arc Quad Limitations  3x10       Knee/Hip Exercises: Supine   Short Arc Quad Sets Limitations  3x10 1lb                PT Short Term Goals - 01/23/19 1131      PT SHORT TERM GOAL #1   Title  Patient will increase passive right hip flexion by 20 degrees     Baseline  per visual inspection improved but still limited     Time  4    Status  On-going      PT SHORT TERM GOAL #2   Title  Patient will increase gross right hip flexion to 4/5     Baseline  continues to have weakness     Time  4    Period  Weeks    Status  On-going      PT SHORT TERM GOAL #3   Title  Patient will ambualte 300' with a single point cane     Time  4    Period  Weeks    Status  On-going        PT Long Term Goals - 12/26/18 1409      PT LONG TERM GOAL #1   Title  Patient will stand for 1/2 hour ewithout self report of pain in order to perfrom ADL's     Time  8    Period  Weeks    Status  On-going      PT LONG TERM GOAL #2   Title  Patient will ambualte 3000' with LRAD without self report of pain in order to perfrom ADL's     Time  8    Period  Weeks    Status  On-going      PT LONG TERM GOAL #3   Title   Patient will demonstrate a 49% limitation on FOTO     Time  8    Period  Weeks    Status  On-going            Plan - 02/20/19 1132    Clinical Impression Statement  Patient was late 2nd to scat bus. She was still able to complete exercises on the table and standing. She appears to have less sensativity an swelling in the anterior hip. She is making slow but steady progress. She was advised to continue light exercises at home.     Clinical Decision Making  Moderate    Rehab Potential  Good    PT Frequency  1x / week    PT Duration  4 weeks    PT Treatment/Interventions  ADLs/Self Care Home Management;Cryotherapy;Electrical Stimulation;Iontophoresis 85m/ml Dexamethasone;Ultrasound;DME Instruction;Moist Heat;Gait training;Stair training;Therapeutic activities;Therapeutic exercise;Neuromuscular re-education;Patient/family education;Manual techniques;Passive range of motion;Taping    PT Next Visit Plan  continue gait training; continue slow progresssion back into exercises as tolerated     PT Home Exercise Plan  HHPT HEP: standing march, standing hip abduction, side steps at counter, mini squats. [facility] HEP: glut squeeze , bridge, ball squeeze, clam red band     Consulted and Agree with Plan of Care  Patient       Patient will benefit from skilled therapeutic intervention in order to improve the following deficits and impairments:  Abnormal gait, Pain, Postural dysfunction, Decreased activity tolerance, Decreased endurance, Decreased range of motion, Decreased strength, Impaired perceived functional ability, Difficulty walking  Visit Diagnosis: Pain in right hip  Stiffness of right hip, not elsewhere classified  Other abnormalities of gait and mobility     Problem List Patient Active Problem List   Diagnosis Date Noted  . Status post total replacement of right hip 10/12/2018  . Unilateral primary osteoarthritis, right hip 09/12/2018  . Degenerative joint disease 05/30/2018  . Anal  fissure 11/10/2017  . Rectal bleeding 11/10/2017  . Rectal pain 11/10/2017  . Inflammatory arthritis 10/13/2017  . Latent tuberculosis by blood test 10/13/2017  . Chronic diastolic CHF (congestive heart failure), NYHA class 2 ([place]) 08/11/2017  . Fatigue due to treatment 01/09/2017  . NSTEMI (non-ST elevated myocardial infarction) ([place]) 11/01/2016  . Diabetes mellitus ([place]) 10/24/2015  . Non-insulin dependent type 2 diabetes mellitus ([place]) 10/24/2015  . Angina, class I ([place]) 04/22/2015  . DOE (dyspnea on exertion) 04/22/2015  . Bilateral leg edema 04/22/2015  . Accumulation of fluid in tissues 03/21/2014  . Vertigo, central 01/02/2014  . Vertigo 01/02/2014  . Limb pain- echymosis, pain Lt radial artery after cath 12/11/2013  . Dyslipidemia, goal LDL below 70 05/17/2013  . Accelerated hypertension with diastolic congestive heart failure, NYHA class 3 ([place]) 05/17/2013  . Claudication ([place]) 05/16/2013  . PTA/ Stent Rt popliteal and Rt peroneal artery 05/16/13 05/16/2013  . PAD,severe calcified pop and tibial peroneal trunk disease bilateraly   . ABDOMINAL PAIN RIGHT LOWER QUADRANT 10/07/2010  . CVA-STROKE 03/04/2010  . BARRETT'S ESOPHAGUS 03/04/2010  . FECAL INCONTINENCE 03/04/2010  . DM 01/14/2010  . Anemia of chronic disease 01/14/2010  . Atherosclerotic heart disease of native coronary artery with angina pectoris ([place]) 01/14/2010  . DIVERTICULOSIS OF COLON 01/14/2010  . PERSONAL HISTORY OF COLONIC POLYPS 01/14/2010  . SHELLFISH ALLERGY 01/14/2010    Carney Living PT DPT  02/20/2019, 1:21 PM  [facility] [address] [place], [name], [address] Phone: [phone]   Fax:  [phone]  Name: [name] MRN: [id number] Date of Birth: 05-17-1933   PHYSICAL THERAPY DISCHARGE SUMMARY  Visits from Start of Care: 14  Current functional level related to goals / functional outcomes: Unknown.  Pt was placed on hold d/t clinic  shut down/covid.  She was called when reopened and she reported she would think about if she needed to return.     Remaining deficits: unknown  Education / Equipment: HEP Plan: Patient agrees to discharge.  Patient goals were not met. Patient is being discharged due to the patient's request.  ?????    Jeral Pinch, PT 08/21/19 8:47 AM

## 2019-02-21 ENCOUNTER — Ambulatory Visit (INDEPENDENT_AMBULATORY_CARE_PROVIDER_SITE_OTHER): Payer: Medicare Other | Admitting: Podiatry

## 2019-02-21 ENCOUNTER — Other Ambulatory Visit: Payer: Self-pay

## 2019-02-21 DIAGNOSIS — B351 Tinea unguium: Secondary | ICD-10-CM | POA: Diagnosis not present

## 2019-02-21 DIAGNOSIS — M79675 Pain in left toe(s): Secondary | ICD-10-CM | POA: Diagnosis not present

## 2019-02-21 DIAGNOSIS — M79674 Pain in right toe(s): Secondary | ICD-10-CM | POA: Diagnosis not present

## 2019-02-21 NOTE — Patient Instructions (Addendum)
Soaking Instructions  Place 1/4 cup of epsom salts in a quart of warm tap water.  Submerge your foot or feet in the solution and soak for 20 minutes.  This soak should be done once a day.  Next, remove your foot or feet from solution, blot dry the affected area. Apply ointment and cover if instructed by your doctor. This should be done for one week.  IF YOUR SKIN BECOMES IRRITATED WHILE USING THESE INSTRUCTIONS, IT IS OKAY TO SWITCH TO  WHITE VINEGAR AND WATER.  As another alternative soak, you may use antibacterial soap and water.  Monitor for any signs/symptoms of infection. Call the office immediately if any occur or go directly to the emergency room. Call with any questions/concerns.   Ingrown Toenail An ingrown toenail occurs when the corner or sides of a toenail grow into the surrounding skin. This causes discomfort and pain. The big toe is most commonly affected, but any of the toes can be affected. If an ingrown toenail is not treated, it can become infected. What are the causes? This condition may be caused by:  Wearing shoes that are too small or tight.  An injury, such as stubbing your toe or having your toe stepped on.  Improper cutting or care of your toenails.  Having nail or foot abnormalities that were present from birth (congenital abnormalities), such as having a nail that is too big for your toe. What increases the risk? The following factors may make you more likely to develop ingrown toenails:  Age. Nails tend to get thicker with age, so ingrown nails are more common among older people.  Cutting your toenails incorrectly, such as cutting them very short or cutting them unevenly. An ingrown toenail is more likely to get infected if you have:  Diabetes.  Blood flow (circulation) problems. What are the signs or symptoms? Symptoms of an ingrown toenail may include:  Pain, soreness, or tenderness.  Redness.  Swelling.  Hardening of the skin that surrounds the  toenail. Signs that an ingrown toenail may be infected include:  Fluid or pus.  Symptoms that get worse instead of better. How is this diagnosed? An ingrown toenail may be diagnosed based on your medical history, your symptoms, and a physical exam. If you have fluid or blood coming from your toenail, a sample may be collected to test for the specific type of bacteria that is causing the infection. How is this treated? Treatment depends on how severe your ingrown toenail is. You may be able to care for your toenail at home.  If you have an infection, you may be prescribed antibiotic medicines.  If you have fluid or pus draining from your toenail, your health care provider may drain it.  If you have trouble walking, you may be given crutches to use.  If you have a severe or infected ingrown toenail, you may need a procedure to remove part or all of the nail. Follow these instructions at home: Foot care   Do not pick at your toenail or try to remove it yourself.  Soak your foot in warm, soapy water. Do this for 20 minutes, 3 times a day, or as often as told by your health care provider. This helps to keep your toe clean and keep your skin soft.  Wear shoes that fit well and are not too tight. Your health care provider may recommend that you wear open-toed shoes while you heal.  Trim your toenails regularly and carefully. Cut your toenails  straight across to prevent injury to the skin at the corners of the toenail. Do not cut your nails in a curved shape.  Keep your feet clean and dry to help prevent infection. Medicines  Take over-the-counter and prescription medicines only as told by your health care provider.  If you were prescribed an antibiotic, take it as told by your health care provider. Do not stop taking the antibiotic even if you start to feel better. Activity  Return to your normal activities as told by your health care provider. Ask your health care provider what  activities are safe for you.  Avoid activities that cause pain. General instructions  If your health care provider told you to use crutches to help you move around, use them as instructed.  Keep all follow-up visits as told by your health care provider. This is important. Contact a health care provider if:  You have more redness, swelling, pain, or other symptoms that do not improve with treatment.  You have fluid, blood, or pus coming from your toenail. Get help right away if:  You have a red streak on your skin that starts at your foot and spreads up your leg.  You have a fever. Summary  An ingrown toenail occurs when the corner or sides of a toenail grow into the surrounding skin. This causes discomfort and pain. The big toe is most commonly affected, but any of the toes can be affected.  If an ingrown toenail is not treated, it can become infected.  Fluid or pus draining from your toenail is a sign of infection. Your health care provider may need to drain it. You may be given antibiotics to treat the infection.  Trimming your toenails regularly and properly can help you prevent an ingrown toenail. This information is not intended to replace advice given to you by your health care provider. Make sure you discuss any questions you have with your health care provider. Document Released: 11/26/2000 Document Revised: 08/17/2017 Document Reviewed: 08/17/2017 Elsevier Interactive Patient Education  2019 [employer].

## 2019-02-27 ENCOUNTER — Ambulatory Visit: Payer: Medicare Other | Admitting: Physical Therapy

## 2019-03-03 ENCOUNTER — Encounter: Payer: Self-pay | Admitting: Podiatry

## 2019-03-03 NOTE — Progress Notes (Signed)
Subjective: [name] presents today with painful, thick toenails 1-5 b/l that she cannot cut and which interfere with daily activities.  Pain is aggravated when wearing enclosed shoe gear.  Patient relates tender digits b/l great toes, but right great toe medial border more symptomatic. She denies any redness, drainage or swelling, but states it is painful to wear enclosed shoe gear.  Jani Gravel, MD is her PCP.    Current Outpatient Medications:  .  acetaminophen-codeine (TYLENOL #3) 300-30 MG tablet, Take 1-2 tablets by mouth every 8 (eight) hours as needed for moderate pain., Disp: 40 tablet, Rfl: 0 .  carvedilol (COREG) 3.125 MG tablet, Take 1 tablet (3.125 mg total) by mouth 2 (two) times daily., Disp: 180 tablet, Rfl: 3 .  clopidogrel (PLAVIX) 75 MG tablet, Take 75 mg by mouth daily., Disp: , Rfl:  .  Coenzyme Q10 (CO Q 10 PO), Take 1 tablet by mouth every other day. , Disp: , Rfl:  .  ezetimibe (ZETIA) 10 MG tablet, Take 10 mg by mouth at bedtime. , Disp: , Rfl:  .  ferrous sulfate 325 (65 FE) MG tablet, Take 1 tablet (325 mg total) by mouth daily with breakfast., Disp: 30 tablet, Rfl: 3 .  folic acid (FOLVITE) 1 MG tablet, Take 1 mg by mouth daily., Disp: , Rfl: 1 .  furosemide (LASIX) 20 MG tablet, Take 40 mg ( 2 tablets) on M-W-F, and take 20 mg  (1 tablet) Tu-Thur-Sat-Sun, Disp: 180 tablet, Rfl: 3 .  gabapentin (NEURONTIN) 300 MG capsule, TAKE 1 CAPSULE BY MOUTH THREE TIMES A DAY, Disp: 30 capsule, Rfl: 1 .  glimepiride (AMARYL) 1 MG tablet, Take 2 mg by mouth daily with breakfast. , Disp: , Rfl: 12 .  HYDROcodone-acetaminophen (NORCO/VICODIN) 5-325 MG tablet, Take 1-2 tablets by mouth every 4 (four) hours as needed for moderate pain (pain score 4-6)., Disp: 40 tablet, Rfl: 0 .  isosorbide mononitrate (IMDUR) 60 MG 24 hr tablet, Take 1.5 tablets (90 mg total) by mouth daily. ., Disp: 135 tablet, Rfl: 3 .  LORazepam (ATIVAN) 0.5 MG tablet, Take 0.5 mg by mouth every 6 (six) hours as  needed for anxiety or sleep. , Disp: , Rfl: 5 .  methocarbamol (ROBAXIN) 500 MG tablet, Take 1 tablet (500 mg total) by mouth every 6 (six) hours as needed for muscle spasms., Disp: 40 tablet, Rfl: 1 .  methotrexate (RHEUMATREX) 2.5 MG tablet, Take 10 mg by mouth every Friday. , Disp: , Rfl: 1 .  nitroGLYCERIN (NITROSTAT) 0.4 MG SL tablet, PLACE 1 TABLET (0.4 MG TOTAL) UNDER THE TONGUE EVERY 5 (FIVE) MINUTES AS NEEDED FOR CHEST PAIN., Disp: 25 tablet, Rfl: 6 .  pantoprazole (PROTONIX) 40 MG tablet, Take 1 tablet (40 mg total) by mouth 2 (two) times daily before a meal., Disp: 60 tablet, Rfl: 1 .  Polyethyl Glycol-Propyl Glycol (SYSTANE ULTRA) 0.4-0.3 % SOLN, Place 1 drop into both eyes at bedtime., Disp: , Rfl:  .  predniSONE (DELTASONE) 5 MG tablet, Take 5 mg by mouth daily with breakfast. , Disp: , Rfl:  .  rosuvastatin (CRESTOR) 20 MG tablet, Take 20 mg by mouth at bedtime. , Disp: , Rfl:  .  sulfaSALAzine (AZULFIDINE) 500 MG EC tablet, Take 1,000 mg by mouth daily. , Disp: , Rfl: 3  Allergies  Allergen Reactions  . Fish Allergy Hives and Shortness Of Breath  . Ivp Dye [Iodinated Diagnostic Agents] Shortness Of Breath and Anaphylaxis  . Latex Anaphylaxis, Hives, Shortness Of Breath,  Itching and Other (See Comments)    REACTION: wheezing  . Shellfish Allergy Anaphylaxis, Hives, Shortness Of Breath and Other (See Comments)    All seafood  . Shellfish-Derived Products Anaphylaxis and Hives  . Other Itching and Other (See Comments)    Patient reports allergy to perfumed detergents. Causes itching.  . Metformin Nausea Only and Nausea And Vomiting    Objective:  Vascular Examination: Capillary refill time immediate x 10 digits.  Dorsalis pedis and Posterior tibial pulses 1/4 b/l  Digital hair absent x 10 digits.  Skin temperature gradient WNL b/l  Dermatological Examination: Skin with normal turgor, texture and tone b/l  Toenails 1-5 b/l discolored, thick, dystrophic with subungual  debris and pain with palpation to nailbeds due to thickness of nails.  Inurvated nailplate b/l great toes b/l borders with tenderness to palpation. No erythema, no edema, no drainage, no purulence.  Musculoskeletal: Muscle strength 5/5 to all LE muscle groups  No gross bony deformities b/l.  No pain, crepitus or joint limitation noted with ROM.   Neurological: Sensation intact with 10 gram monofilament.  Vibratory sensation intact.  Assessment: Painful onychomycosis toenails 1-5 b/l  Ingrown toenails b/l great toes, noninfected.  Plan: 1. Toenails 1-5 b/l were debrided in length and girth without iatrogenic bleeding. Offending nail borders debrided and curretaged. Borders cleansed with alcohol. Polysporin Ointment applied. Patient instructed to soak in epsom salt/warm water once daily for one week and apply antibiotic ointment after soaks. 2. Patient to continue soft, supportive shoe gear daily. 3. Patient to report any pedal injuries to medical professional immediately. 4. Follow up 1 month to check ingrown toenails. 5. Patient/POA to call should there be a concern in the interim.

## 2019-03-06 ENCOUNTER — Encounter: Payer: Medicare Other | Admitting: Physical Therapy

## 2019-03-12 ENCOUNTER — Encounter: Payer: Self-pay | Admitting: Physical Therapy

## 2019-03-13 ENCOUNTER — Ambulatory Visit: Payer: Medicare Other | Admitting: Physical Therapy

## 2019-03-16 ENCOUNTER — Ambulatory Visit: Payer: Medicare Other | Admitting: Podiatry

## 2019-03-16 ENCOUNTER — Telehealth (INDEPENDENT_AMBULATORY_CARE_PROVIDER_SITE_OTHER): Payer: Self-pay

## 2019-03-16 NOTE — Telephone Encounter (Signed)
Called patient and asked the screening questions.  Do you have now or have you had in the past 7 days a fever and/or chills? NO  Do you have now or have you had in the past 7 days a cough? NO  Do you have now or have you had in the last 7 days nausea, vomiting or abdominal pain? NO  Have you been exposed to anyone who has tested positive for COVID-19? NO  Have you or anyone who lives with you traveled within the last month? NO 

## 2019-03-19 ENCOUNTER — Ambulatory Visit (INDEPENDENT_AMBULATORY_CARE_PROVIDER_SITE_OTHER): Payer: Medicare Other | Admitting: Orthopaedic Surgery

## 2019-03-21 ENCOUNTER — Telehealth: Payer: Self-pay | Admitting: Physical Therapy

## 2019-03-21 NOTE — Telephone Encounter (Signed)
[name] was contacted today regarding the temporary reduction of OP Rehab Services due to concerns for community transmission of Covid-19.    Therapist advised the patient to continue to perform their HEP and assured they had no unanswered questions at this time.  The patient was offered and declined the continuation in their POC by using methods such as an e-visit, virtual check in, or telehealth visit.    Outpatient Rehabilitation Services will follow up with this client when we are able to safely resume care at the [facility] in person.

## 2019-03-22 ENCOUNTER — Encounter (HOSPITAL_COMMUNITY): Payer: Medicare Other

## 2019-03-23 ENCOUNTER — Telehealth: Payer: Self-pay

## 2019-03-23 NOTE — Telephone Encounter (Signed)
LEFT MESSAGE FOR PT TO RETURN CALL ABOUT HER APPT CHANGE TO VIRTUAL

## 2019-04-02 ENCOUNTER — Ambulatory Visit (INDEPENDENT_AMBULATORY_CARE_PROVIDER_SITE_OTHER): Payer: Medicare Other | Admitting: Orthopaedic Surgery

## 2019-04-03 ENCOUNTER — Ambulatory Visit (INDEPENDENT_AMBULATORY_CARE_PROVIDER_SITE_OTHER): Payer: Medicare Other | Admitting: Orthopaedic Surgery

## 2019-04-03 ENCOUNTER — Other Ambulatory Visit: Payer: Self-pay

## 2019-04-03 ENCOUNTER — Ambulatory Visit (INDEPENDENT_AMBULATORY_CARE_PROVIDER_SITE_OTHER): Payer: Medicare Other

## 2019-04-03 ENCOUNTER — Encounter (INDEPENDENT_AMBULATORY_CARE_PROVIDER_SITE_OTHER): Payer: Self-pay | Admitting: Orthopaedic Surgery

## 2019-04-03 DIAGNOSIS — I25119 Atherosclerotic heart disease of native coronary artery with unspecified angina pectoris: Secondary | ICD-10-CM | POA: Diagnosis not present

## 2019-04-03 DIAGNOSIS — Z96641 Presence of right artificial hip joint: Secondary | ICD-10-CM

## 2019-04-03 DIAGNOSIS — M1612 Unilateral primary osteoarthritis, left hip: Secondary | ICD-10-CM

## 2019-04-03 MED ORDER — HYDROCODONE-ACETAMINOPHEN 5-325 MG PO TABS: 1.0000 | ORAL_TABLET | Freq: Four times a day (QID) | ORAL | 0 refills | Status: DC | PRN

## 2019-04-03 NOTE — Progress Notes (Signed)
The patient is a very pleasant 85 year old female who is now 6 months status post a right total hip arthroplasty.  She said the hip is doing well but is just a little sore to her.  She just takes Tylenol for pain as needed.  She does ambulate with a walker.  She has significant pain in her left hip in her groin.  She does have a leg length discrepancy with her left side being shorter than her operative right side and this is related to her surgery on the right side combined with significant arthritis she has on her left side.  On exam her right hip moves smoothly.  Her left hip has significant pain with internal and external rotation.  She does have a leg length discrepancy as well with the left side shorter than the right.  At some point she will consider a a left total hip arthroplasty.  As for now, we will send her to [employer] for an insert for her left shoe to help with her leg length discrepancy.  I will send in some hydrocodone to use sparingly for when she needs it.  She can drive from my standpoint.  When we do see her back in 3 months we do not need any x-rays.

## 2019-04-09 ENCOUNTER — Encounter (HOSPITAL_COMMUNITY): Payer: Medicare Other

## 2019-04-10 ENCOUNTER — Telehealth: Payer: Self-pay | Admitting: Physician Assistant

## 2019-04-10 ENCOUNTER — Telehealth: Payer: Self-pay

## 2019-04-10 ENCOUNTER — Encounter: Payer: Self-pay | Admitting: Physician Assistant

## 2019-04-10 ENCOUNTER — Telehealth (INDEPENDENT_AMBULATORY_CARE_PROVIDER_SITE_OTHER): Payer: Medicare Other | Admitting: Physician Assistant

## 2019-04-10 DIAGNOSIS — I10 Essential (primary) hypertension: Secondary | ICD-10-CM

## 2019-04-10 DIAGNOSIS — I5033 Acute on chronic diastolic (congestive) heart failure: Secondary | ICD-10-CM

## 2019-04-10 DIAGNOSIS — R300 Dysuria: Secondary | ICD-10-CM

## 2019-04-10 DIAGNOSIS — I25810 Atherosclerosis of coronary artery bypass graft(s) without angina pectoris: Secondary | ICD-10-CM

## 2019-04-10 DIAGNOSIS — I6523 Occlusion and stenosis of bilateral carotid arteries: Secondary | ICD-10-CM

## 2019-04-10 DIAGNOSIS — E785 Hyperlipidemia, unspecified: Secondary | ICD-10-CM

## 2019-04-10 DIAGNOSIS — D649 Anemia, unspecified: Secondary | ICD-10-CM

## 2019-04-10 DIAGNOSIS — I739 Peripheral vascular disease, unspecified: Secondary | ICD-10-CM

## 2019-04-10 MED ORDER — FUROSEMIDE 40 MG PO TABS: ORAL_TABLET | ORAL | 1 refills | Status: DC

## 2019-04-10 NOTE — Telephone Encounter (Signed)
Smart phone/No MyChart/pre reg complete. 04-09-19 ST

## 2019-04-10 NOTE — Telephone Encounter (Signed)

## 2019-04-10 NOTE — Patient Instructions (Signed)
Medication Instructions:   START taking LASIX 40 Mg daily  If you need a refill on your cardiac medications before your next appointment, please call your pharmacy.   Lab work:  You will need to come into our office next week 04/16/2019 to have labs (blood work and an urinalysis done: BMET CBC URINALYSIS with REFLEX  If you have labs (blood work) drawn today and your tests are completely normal, you will receive your results only by: Marland Kitchen MyChart Message (if you have MyChart) OR . A paper copy in the mail If you have any lab test that is abnormal or we need to change your treatment, we will call you to review the results.  Testing/Procedures:  NONE ordered at this time of appointment   Follow-Up: At [facility], you and your health needs are our priority.  As part of our continuing mission to provide you with exceptional heart care, we have created designated Provider Care Teams.  These Care Teams include your primary Cardiologist (physician) and Advanced Practice Providers (APPs -  Physician Assistants and Nurse Practitioners) who all work together to provide you with the care you need, when you need it. You will need a follow up appointment in 4 months.  Please call our office 2 months in advance to schedule this appointment.  You may see [name], MD or one of the following Advanced Practice Providers on your designated Care Team:   [name], PA-C . Jory Sims, DNP, ANP  Any Other Special Instructions Will Be Listed Below (If Applicable).

## 2019-04-10 NOTE — Telephone Encounter (Signed)
Called both home and mobile number listed for the patient. Could not leave a message on the mobile number due to mailbox being full. Left a detailed message on the home number about her appointment today with [name], PA-C

## 2019-04-10 NOTE — Progress Notes (Signed)
Virtual Visit via Telephone Note   This visit type was conducted due to national recommendations for restrictions regarding the COVID-19 Pandemic (e.g. social distancing) in an effort to limit this patient's exposure and mitigate transmission in our community.  Due to her co-morbid illnesses, this patient is at least at moderate risk for complications without adequate follow up.  This format is felt to be most appropriate for this patient at this time.  The patient did not have access to video technology/had technical difficulties with video requiring transitioning to audio format only (telephone).  All issues noted in this document were discussed and addressed.  No physical exam could be performed with this format.  Please refer to the patient's chart for her  consent to telehealth for [facility].   Evaluation Performed:  Follow-up visit  Date:  04/11/2019   ID:  Monica, Flores Aug 19, 1933, MRN [id number]  Patient Location: Home Provider Location: Home  PCP:  Monica Gravel, MD  Cardiologist:  Monica Hew, MD Electrophysiologist:  None   Chief Complaint:  followup  History of Present Illness:    [name] is a 85 y.o. female with PMH of CAD s/p CABG x 3 1998,asymptomatic bradycardia, carotid artery disease, chronic anemia, chronic diastolic heart failure, DM 2, hypertension, hyperlipidemia, PAD, and history of peptic ulcer disease. She has preserved LV function after the bypass surgery. She also had distal right SFA PTA and stenting of the right peroneal artery for lifestyle limiting claudication. Last cardiac catheterization was performed in November 2017 that showed severe three-vessel native disease, patent SVG to OM, patent LIMA to LAD, 50% disease in SVG to diagonal. She is on the statin and Plavix for her PAD.Echocardiogram obtained on 09/14/2018 showed EF 65 to 70%, grade 1 DD, severe LAE, mild TR, PA peak pressure 25 mmHg. Myoview performed on 09/15/2018 with EF of 52%,  no obvious ischemia, overall low risk study. Patient underwent right total hip surgery and was recently discharged on 10/16/2018.  I last saw the patient in November 2019, she appears to be volume overloaded at the time.  I suspect it was related to IV fluid she received during her surgery.  I increased her Lasix to 40 mg daily at the time.  I also increased her Imdur to 90 mg daily due to concern of chest discomfort despite negative Myoview.  Patient was most recently seen by Dr. Ellyn Flores on 02/05/2019, her Lasix was increased to 40 mg on Monday Wednesday Friday and 20 mg on all of the other days.  She was also started on ferrous sulfate for anemia.  Patient was contacted today via telephone visit.  She has been having some worsening lower extremity edema.  Unfortunately, I do not see any lab work after the last office visit.  I asked her to increase Lasix to 40 mg daily.  She also has some symptom of dysuria as well and wondering if she is having another UTI.  I plan to obtain CBC, basic metabolic panel and urinalysis next Monday.  Otherwise she denies any chest discomfort or significant shortness of breath.   The patient does not have symptoms concerning for COVID-19 infection (fever, chills, cough, or new shortness of breath).    Past Medical History:  Diagnosis Date  . Atherosclerotic heart disease native coronary artery w/angina pectoris ([place]) 1992   a. 1992 s/p PTCA of LAD;  b. 1998 CABG x 3; c. 07/2001 Cath: Sev LM/LAD/LCX dzs, 3/3 patent grafts; d. 11/2013 Cath: stable  graft anatomy; e. 10/2016 Cath: native 3VD, VG->OM nl, LIMA->LAD nl, VG->Diag 55.  . Bradycardia, mild briefly to the 40s, asymptomatic 05/16/2013  . Carotid arterial disease ([place])    a. 05/2014 Carotid U/S: No signif bilat ICA stenosis, >60% L ECA.  Marland Kitchen Chronic anemia   . Chronic diastolic CHF (congestive heart failure) ([place])    a. 10/2016 Echo: Ef 55-60%, no rwma, Gr1 DD, triv AI, PASP 49mmHg, atrial septal aneurysm.  . Chronic  diastolic CHF (congestive heart failure), NYHA class 2 ([place]) 08/11/2017  . DM (diabetes mellitus), type 2 with peripheral vascular complications ([facility])    On Oral medications.  . Essential hypertension   . GERD (gastroesophageal reflux disease)   . Hyperlipidemia with target LDL less than 70   . PAD (peripheral artery disease) ([place]) 05/2013; 09/2013   a. severe calcified POP-1 & TP Trunk Dz bilat;  b . 05/2013 Diamondback Rot Athrectomy (R POP) --> PTA R Pop, DES to R Peroneal;  c. 09/2013 PTA/Stenting of L Pop [name] - retrograde access);  d. 04/2014 ABI's: R/L: 1.1/1.1.  Marland Kitchen Peptic ulcer disease   . S/P CABG x 3 1998   LIMA-LAD, SVG-OM, SVG-D1  . Severe claudication ([place]) 05/2013   Referred for Peripheral Angio (Dr. Gwenlyn Flores --> Dr. Brunetta Flores in [place])   Past Surgical History:  Procedure Laterality Date  . ATHERECTOMY Right 05/15/2013   Procedure: ATHERECTOMY;  Surgeon: Monica Harp, MD;  Location: [facility] CATH LAB;  Service: Cardiovascular;  Laterality: Right;  popliteal  . BACK SURGERY    . CARDIAC CATHETERIZATION  07/17/01   2 v CAD with LM, circ, LAD, obtuse diag, mod stenosis of diag vein graft , mild stenosis of marg vein graft, nl EF, medical treatment  . CARDIAC CATHETERIZATION  11/2013   Widely patent LIMA-LAD & SVG-OM with mild progression of proximal stenosis ~40-50% in SVG-D1; Widely patent native RCA with known severe native LCA disease  . CARDIAC CATHETERIZATION N/A 11/01/2016   Procedure: Left Heart Cath and Cors/Grafts Angiography;  Surgeon: Monica Man, MD;  Location: [facility] CV LAB;  Service: Cardiovascular: Hemodynamics: High LVEDP!.  Cors/Grafts: LM 55%, o-p LAD 100% then after D1 -> LIMA-LAD patent, SVG-Diag ~55%. small RI wiht ~70% ostial. O-p Cx 80% - pCx 70% --> SVG-bifurcating OM patent. -- med Rx  . CORONARY ANGIOPLASTY  1992   PCI to LAD  . CORONARY ARTERY BYPASS GRAFT  1998   LIMA-LAD, SVG-diagonal (none roughly 50% stenosis), SVG-OM  . LEFT HEART  CATHETERIZATION WITH CORONARY ANGIOGRAM N/A 11/27/2013   Procedure: LEFT HEART CATHETERIZATION WITH CORONARY ANGIOGRAM;  Surgeon: Monica Man, MD;  Location: [facility] CATH LAB;  Service: Cardiovascular;  Laterality: N/A;  . LOW EXTREMITY DOPPLERS/ABI  10/2016   Patent right SFA, popliteal and peroneal tendons. Patent left SFA and popliteal stent. 30-50% stenosis in the right common femoral and popliteal arteries, 50-74% stenosis in left profunda femoris artery. --> 1 year follow-up.  RABI - 1.2, LABI 1.1  . LOWER EXTREMITY ANGIOGRAM N/A 02/22/2013   Procedure: LOWER EXTREMITY ANGIOGRAM;  Surgeon: Monica Harp, MD;  Location: [facility] CATH LAB;  Service: Cardiovascular;  Laterality: N/A;  . LOWER EXTREMITY ANGIOGRAM N/A 07/20/2013   Procedure: LOWER EXTREMITY ANGIOGRAM;  Surgeon: Monica Harp, MD;  Location: [facility] CATH LAB;  Service: Cardiovascular;  Laterality: N/A;  . NM MYOVIEW LTD  04/03/2013; 5/26'/2016   Lexiscan: a)  Apical perfusion defect with no ischemia. Consider prior infarct versus breast attenuation.;; b) 5/'16:  LOW RISK, Nl EF ~55-65%, No Ischemia /Infarction  . NM MYOVIEW LTD  09/2018   LOW RISK.  EF 55%.  No ischemia or infarction.  Marland Kitchen PERCUTANEOUS STENT INTERVENTION Right 05/15/2013   Procedure: PERCUTANEOUS STENT INTERVENTION;  Surgeon: Monica Harp, MD;  Location: [facility] CATH LAB;  Service: Cardiovascular;  Laterality: Right;  tibioperoneal trunk  . Peroneal Artery Stent  05/15/13   Diamondback rot. atherectomy of high grade segmental popliteal stenosis then Chocolate balloonPTA; then angiosculpt PTA of the prox peroneal with stent-Xpedition   . pv angio  07/20/13   high-grade calcified popliteal disease with one-vessel runoff via a small anterior tibial artery that has proximal disease as well, no intervention  . TOTAL HIP ARTHROPLASTY Right 10/12/2018   Procedure: RIGHT TOTAL HIP ARTHROPLASTY ANTERIOR APPROACH;  Surgeon: Mcarthur Rossetti, MD;  Location: [facility] ORS;  Service: Orthopedics;   Laterality: Right;  . TRANSTHORACIC ECHOCARDIOGRAM  10/2016   EF 55-60%. Gr 1 DD. Normal PAP. Aortic Sclerosis.  . TRANSTHORACIC ECHOCARDIOGRAM  09/2018   Normal LV size and function with vigorous EF 65 to 70%.  GR 1 DD.  Severe LA dilation (suggestive of probably worsening: GR 1 DD).  . TUBAL LIGATION       Current Meds  Medication Sig  . acetaminophen-codeine (TYLENOL #3) 300-30 MG tablet Take 1-2 tablets by mouth every 8 (eight) hours as needed for moderate pain.  . carvedilol (COREG) 3.125 MG tablet Take 1 tablet (3.125 mg total) by mouth 2 (two) times daily.  . clopidogrel (PLAVIX) 75 MG tablet Take 75 mg by mouth daily.  . Coenzyme Q10 (CO Q 10 PO) Take 1 tablet by mouth every other day.   . ferrous sulfate 325 (65 FE) MG tablet Take 1 tablet (325 mg total) by mouth daily with breakfast. (Patient taking differently: Take 27 mg by mouth daily with breakfast. )  . folic acid (FOLVITE) 1 MG tablet Take 1 mg by mouth daily.  . furosemide (LASIX) 40 MG tablet Take 40 Mg (1 tablet) daily  . gabapentin (NEURONTIN) 300 MG capsule TAKE 1 CAPSULE BY MOUTH THREE TIMES A DAY (Patient taking differently: 2 (two) times daily. )  . glimepiride (AMARYL) 1 MG tablet Take 2 mg by mouth daily with breakfast.   . isosorbide mononitrate (IMDUR) 60 MG 24 hr tablet Take 1.5 tablets (90 mg total) by mouth daily. .  . methotrexate (RHEUMATREX) 2.5 MG tablet Take 10 mg by mouth every Friday.   . pantoprazole (PROTONIX) 40 MG tablet Take 1 tablet (40 mg total) by mouth 2 (two) times daily before a meal. (Patient taking differently: Take 40 mg by mouth daily. )  . predniSONE (DELTASONE) 5 MG tablet Take 5 mg by mouth daily with breakfast.   . rosuvastatin (CRESTOR) 20 MG tablet Take 20 mg by mouth at bedtime.   . sulfaSALAzine (AZULFIDINE) 500 MG EC tablet Take 1,000 mg by mouth daily.   . [DISCONTINUED] furosemide (LASIX) 20 MG tablet Take 40 mg ( 2 tablets) on M-W-F, and take 20 mg  (1 tablet) Tu-Thur-Sat-Sun      Allergies:   Fish allergy; Ivp dye [iodinated diagnostic agents]; Latex; Shellfish allergy; Shellfish-derived products; Other; and Metformin   Social History   Tobacco Use  . Smoking status: Never Smoker  . Smokeless tobacco: Never Used  Substance Use Topics  . Alcohol use: No    Comment: rare glass of wine   . Drug use: No     Family Hx: The patient's  family history includes Hyperlipidemia in her father and mother; Hypertension in her father and mother.  ROS:   Please see the history of present illness.     All other systems reviewed and are negative.   Prior CV studies:   The following studies were reviewed today:  Echo 09/14/2018 LV EF: 65% -   70% Study Conclusions  - Left ventricle: The cavity size was normal. Systolic function was   vigorous. The estimated ejection fraction was in the range of 65%   to 70%. Wall motion was normal; there were no regional wall   motion abnormalities. Doppler parameters are consistent with   abnormal left ventricular relaxation (grade 1 diastolic   dysfunction). Doppler parameters are consistent with   indeterminate ventricular filling pressure. - Aortic valve: Transvalvular velocity was within the normal range.   There was no stenosis. There was no regurgitation. - Mitral valve: Transvalvular velocity was within the normal range.   There was no evidence for stenosis. There was trivial   regurgitation. - Left atrium: The atrium was severely dilated. - Right ventricle: The cavity size was normal. Wall thickness was   normal. Systolic function was normal. - Tricuspid valve: There was mild regurgitation. - Pulmonary arteries: Systolic pressure was within the normal   range. PA peak pressure: 25 mm Hg (S).  Labs/Other Tests and Data Reviewed:    EKG:  An ECG dated 11/11/2018 was personally reviewed today and demonstrated:  Normal sinus rhythm without significant ST-T wave changes.  Recent Labs: 10/12/2018: ALT 17 10/15/2018:  Hemoglobin 8.8; Platelets 185 12/04/2018: BUN 13; Creatinine, Ser 0.88; Potassium 3.9; Sodium 141   Recent Lipid Panel Lab Results  Component Value Date/Time   CHOL 138 05/19/2017 12:54 PM   TRIG 55 05/19/2017 12:54 PM   HDL 54 05/19/2017 12:54 PM   CHOLHDL 2.6 05/19/2017 12:54 PM   CHOLHDL 3.6 11/02/2016 06:40 AM   LDLCALC 73 05/19/2017 12:54 PM    Wt Readings from Last 3 Encounters:  02/05/19 158 lb (71.7 kg)  11/29/18 158 lb 9.6 oz (71.9 kg)  11/02/18 160 lb (72.6 kg)     Objective:    Vital Signs:  There were no vitals taken for this visit.   VITAL SIGNS:  reviewed No vital signs were available  ASSESSMENT & PLAN:    1. Acute on chronic diastolic heart failure: She is having some increasing lower extremity edema recently.  I plan to increase Lasix to 40 mg daily and obtain basic metabolic panel next Monday  2. CAD s/p CABG x3 in 1998: Denies any chest discomfort.  Continue carvedilol and Plavix  3. Carotid artery disease: Asymptomatic, no dizziness  4. Chronic anemia: Obtain repeat CBC.  5. Hypertension: Continue carvedilol and Imdur  6. DM2: Managed by primary care provider  7. Hyperlipidemia: On Crestor.  8. PAD: Although she does have leg pain however her leg pain is not associated with the degree of exertion.  Walking does not make it worse.  Last ABI in March 2019.  9. Dysuria: Obtain urinalysis  COVID-19 Education: The signs and symptoms of COVID-19 were discussed with the patient and how to seek care for testing (follow up with PCP or arrange E-visit).  The importance of social distancing was discussed today.  Time:   Today, I have spent 16 minutes with the patient with telehealth technology discussing the above problems.     Medication Adjustments/Labs and Tests Ordered: Current medicines are reviewed at length with the patient today.  Concerns regarding medicines are outlined above.   Tests Ordered: Orders Placed This Encounter  Procedures  .  Urinalysis, Routine w reflex microscopic  . CBC  . Basic metabolic panel    Medication Changes: Meds ordered this encounter  Medications  . furosemide (LASIX) 40 MG tablet    Sig: Take 40 Mg (1 tablet) daily    Dispense:  90 tablet    Refill:  1    Disposition:  Follow up in 3 month(s)  Signed, [name], PA  04/11/2019 11:53 PM    [facility]

## 2019-04-17 ENCOUNTER — Telehealth: Payer: Self-pay | Admitting: Physical Therapy

## 2019-04-17 NOTE — Telephone Encounter (Signed)
Spoke with patient regarding PT POC. She says she wants to think about it (returning) and will call us back after 6/1. Klaudia Beirne C. Myrna Vonseggern PT, DPT 04/17/19 9:34 AM

## 2019-04-19 DIAGNOSIS — R300 Dysuria: Secondary | ICD-10-CM | POA: Diagnosis not present

## 2019-04-19 DIAGNOSIS — I5032 Chronic diastolic (congestive) heart failure: Secondary | ICD-10-CM | POA: Diagnosis not present

## 2019-04-19 DIAGNOSIS — D649 Anemia, unspecified: Secondary | ICD-10-CM | POA: Diagnosis not present

## 2019-04-19 LAB — URINALYSIS, ROUTINE W REFLEX MICROSCOPIC
Bilirubin, UA: NEGATIVE
Glucose, UA: NEGATIVE
Ketones, UA: NEGATIVE
Leukocytes,UA: NEGATIVE
Nitrite, UA: NEGATIVE
RBC, UA: NEGATIVE
Specific Gravity, UA: 1.023 (ref 1.005–1.030)
Urobilinogen, Ur: 0.2 mg/dL (ref 0.2–1.0)
pH, UA: 5.5 (ref 5.0–7.5)

## 2019-04-19 LAB — CBC
Hematocrit: 36.5 % (ref 34.0–46.6)
Hemoglobin: 11.2 g/dL (ref 11.1–15.9)
MCH: 29 pg (ref 26.6–33.0)
MCHC: 30.7 g/dL — ABNORMAL LOW (ref 31.5–35.7)
MCV: 95 fL (ref 79–97)
Platelets: 257 10*3/uL (ref 150–450)
RBC: 3.86 x10E6/uL (ref 3.77–5.28)
RDW: 14.7 % (ref 11.7–15.4)
WBC: 4.8 10*3/uL (ref 3.4–10.8)

## 2019-04-19 LAB — BASIC METABOLIC PANEL
BUN/Creatinine Ratio: 16 (ref 12–28)
BUN: 14 mg/dL (ref 8–27)
CO2: 26 mmol/L (ref 20–29)
Calcium: 9.6 mg/dL (ref 8.7–10.3)
Chloride: 98 mmol/L (ref 96–106)
Creatinine, Ser: 0.86 mg/dL (ref 0.57–1.00)
GFR calc Af Amer: 72 mL/min/{1.73_m2} (ref >59)
GFR calc non Af Amer: 63 mL/min/{1.73_m2} (ref >59)
Glucose: 197 mg/dL — ABNORMAL HIGH (ref 65–99)
Potassium: 4 mmol/L (ref 3.5–5.2)
Sodium: 140 mmol/L (ref 134–144)

## 2019-04-23 NOTE — Progress Notes (Signed)
The patient has been notified of the result and verbalized understanding.  All questions (if any) were answered. [name], [place] 04/23/2019 2:54 PM

## 2019-05-08 DIAGNOSIS — M1990 Unspecified osteoarthritis, unspecified site: Secondary | ICD-10-CM | POA: Diagnosis not present

## 2019-05-08 DIAGNOSIS — M0609 Rheumatoid arthritis without rheumatoid factor, multiple sites: Secondary | ICD-10-CM | POA: Diagnosis not present

## 2019-05-08 DIAGNOSIS — Z79899 Other long term (current) drug therapy: Secondary | ICD-10-CM | POA: Diagnosis not present

## 2019-05-08 DIAGNOSIS — M79643 Pain in unspecified hand: Secondary | ICD-10-CM | POA: Diagnosis not present

## 2019-05-08 DIAGNOSIS — R7611 Nonspecific reaction to tuberculin skin test without active tuberculosis: Secondary | ICD-10-CM | POA: Diagnosis not present

## 2019-05-08 DIAGNOSIS — M25551 Pain in right hip: Secondary | ICD-10-CM | POA: Diagnosis not present

## 2019-05-08 DIAGNOSIS — E1169 Type 2 diabetes mellitus with other specified complication: Secondary | ICD-10-CM | POA: Diagnosis not present

## 2019-05-08 DIAGNOSIS — D649 Anemia, unspecified: Secondary | ICD-10-CM | POA: Diagnosis not present

## 2019-05-08 DIAGNOSIS — Z13820 Encounter for screening for osteoporosis: Secondary | ICD-10-CM | POA: Diagnosis not present

## 2019-05-16 DIAGNOSIS — H52203 Unspecified astigmatism, bilateral: Secondary | ICD-10-CM | POA: Diagnosis not present

## 2019-05-16 DIAGNOSIS — H43813 Vitreous degeneration, bilateral: Secondary | ICD-10-CM | POA: Diagnosis not present

## 2019-05-16 DIAGNOSIS — E119 Type 2 diabetes mellitus without complications: Secondary | ICD-10-CM | POA: Diagnosis not present

## 2019-05-16 DIAGNOSIS — H26493 Other secondary cataract, bilateral: Secondary | ICD-10-CM | POA: Diagnosis not present

## 2019-05-21 ENCOUNTER — Other Ambulatory Visit: Payer: Self-pay

## 2019-05-21 ENCOUNTER — Ambulatory Visit (HOSPITAL_COMMUNITY)
Admission: RE | Admit: 2019-05-21 | Discharge: 2019-05-21 | Disposition: A | Discharge status: Home / Self Care | Payer: Medicare Other | Source: Ambulatory Visit | Attending: Cardiovascular Disease | Admitting: Cardiovascular Disease

## 2019-05-21 DIAGNOSIS — I739 Peripheral vascular disease, unspecified: Secondary | ICD-10-CM

## 2019-06-12 ENCOUNTER — Other Ambulatory Visit: Payer: Self-pay | Admitting: Cardiology

## 2019-06-18 ENCOUNTER — Other Ambulatory Visit (HOSPITAL_COMMUNITY): Payer: Self-pay | Admitting: Cardiovascular Disease

## 2019-06-18 ENCOUNTER — Ambulatory Visit (HOSPITAL_COMMUNITY)
Admission: RE | Admit: 2019-06-18 | Payer: Medicare Other | Source: Ambulatory Visit | Attending: Cardiovascular Disease | Admitting: Cardiovascular Disease

## 2019-06-18 DIAGNOSIS — Z95820 Peripheral vascular angioplasty status with implants and grafts: Secondary | ICD-10-CM

## 2019-06-18 DIAGNOSIS — I739 Peripheral vascular disease, unspecified: Secondary | ICD-10-CM

## 2019-06-20 ENCOUNTER — Other Ambulatory Visit: Payer: Self-pay

## 2019-06-20 ENCOUNTER — Ambulatory Visit (HOSPITAL_COMMUNITY)
Admission: RE | Admit: 2019-06-20 | Payer: Medicare Other | Source: Ambulatory Visit | Attending: Cardiovascular Disease | Admitting: Cardiovascular Disease

## 2019-06-20 ENCOUNTER — Ambulatory Visit (HOSPITAL_COMMUNITY)
Admission: RE | Admit: 2019-06-20 | Discharge: 2019-06-20 | Disposition: A | Discharge status: Home / Self Care | Payer: Medicare Other | Source: Ambulatory Visit | Attending: Cardiology | Admitting: Cardiology

## 2019-06-20 DIAGNOSIS — Z95820 Peripheral vascular angioplasty status with implants and grafts: Secondary | ICD-10-CM | POA: Insufficient documentation

## 2019-06-20 DIAGNOSIS — I739 Peripheral vascular disease, unspecified: Secondary | ICD-10-CM

## 2019-06-22 ENCOUNTER — Telehealth: Payer: Self-pay

## 2019-06-22 NOTE — Telephone Encounter (Signed)
    COVID-19 Pre-Screening Questions:  . In the past 7 to 10 days have you had a cough,  shortness of breath, headache, congestion, fever (100 or greater) body aches, chills, sore throat, or sudden loss of taste or sense of smell? NO . Have you been around anyone with known Covid 19? NO . Have you been around anyone who is awaiting Covid 19 test results in the past 7 to 10 days? NO . Have you been around anyone who has been exposed to Covid 19, or has mentioned symptoms of Covid 19 within the past 7 to 10 days? NO  If you have any concerns/questions about symptoms patients report during screening (either on the phone or at threshold). Contact the provider seeing the patient or DOD for further guidance.  If neither are available contact a member of the leadership team.        Spoke with pt who is ok with changing appt from 715 at 12pm to 7/14 at 2:30pm. She is aware of mask policy and of restrictions regarding guests. Pt verbalized understanding

## 2019-06-26 ENCOUNTER — Ambulatory Visit (INDEPENDENT_AMBULATORY_CARE_PROVIDER_SITE_OTHER): Payer: Medicare Other | Admitting: Cardiovascular Disease

## 2019-06-26 ENCOUNTER — Encounter: Payer: Self-pay | Admitting: Cardiovascular Disease

## 2019-06-26 ENCOUNTER — Other Ambulatory Visit: Payer: Self-pay

## 2019-06-26 VITALS — BP 132/62 | HR 76 | Temp 97.1°F | Ht 63.0 in | Wt 157.0 lb

## 2019-06-26 DIAGNOSIS — I6523 Occlusion and stenosis of bilateral carotid arteries: Secondary | ICD-10-CM

## 2019-06-26 DIAGNOSIS — I739 Peripheral vascular disease, unspecified: Secondary | ICD-10-CM | POA: Diagnosis not present

## 2019-06-26 DIAGNOSIS — I10 Essential (primary) hypertension: Secondary | ICD-10-CM

## 2019-06-26 MED ORDER — ACETAMINOPHEN 500 MG PO TABS: 500.0000 mg | ORAL_TABLET | Freq: Every day | ORAL | 0 refills | Status: DC | PRN

## 2019-06-26 NOTE — Progress Notes (Signed)
06/26/2019 [name]   1933/03/11  [id number]  Primary Physician Jani Gravel, MD Primary Cardiologist: Lorretta Harp MD [name], [place]  HPI:  [name] is a 85 y.o.  married African American female patient of Dr. Shanon Brow Harding's and Dr. Leigh Aurora. I last saw her in the office 03/01/2018.She has known CAD status post coronary bypass grafting in the past (1998). She has preserved LV function and nonischemic Myoview. Because of claudication she underwent peripheral vascular angiography and other [name] 02/22/13 revealing significant popliteal and infrapopliteal disease. She has lifestyle limiting claudication.on 05/16/13 she underwent diamondback orbital rotational atherectomy, PTA of her distal right SFA and stenting using coronary drug-eluting stent for peroneal artery which was her only patent artery on the right. She does not notice any improvement and claudication since her procedure although she says her right leg his less "heavy" and that her left leg is much more symptomatic.I restudied her 07/20/13 with the intention of fixing her left leg however I decided to defer this because of anatomic complexity. Ultimately I referred her to Dr. Brunetta Jeans in [place] who performed intervention on her left popliteal and tibioperoneal trunk on 10/01/13. Followup Dopplers performed 04/29/14 were excellent with ABIs of greater than 1 bilaterally, patent peroneal stent on the right a patent left popliteal. Subsequent Dopplers performed in December showed a right ABI of 0.81 and a left ABI 0.91 with patent stents  She had done well after intervention and actually said that she was able to ambulate with claudication. Her last Doppler studies performed 11/10/16 revealed normal ABIs bilaterally with patent SFAs and infrapopliteal vessels. She has noticed some superficial hypersensitivity on her left pretibial area that had venous Doppler studies performed recently that showed no evidence of  DVT.  Since I saw her a year ago she did have a right total hip replacement 11/19 which she is still recovering from.  Recent lower extremity arterial Doppler studies performed 06/21/2019 revealed a decline in her right ABI 2.76 with a high-frequency signal in her right popliteal artery and monophasic waveforms below that.  She complains of pain in both legs from the knee down left greater than right at rest and with exertion despite normal left ABIs and a palpable pedal pulse on that side.    No outpatient medications have been marked as taking for the 06/26/19 encounter (Office Visit) with Lorretta Harp, MD.     Allergies  Allergen Reactions   Fish Allergy Hives and Shortness Of Breath   Ivp Dye [Iodinated Diagnostic Agents] Shortness Of Breath and Anaphylaxis   Latex Anaphylaxis, Hives, Shortness Of Breath, Itching and Other (See Comments)    REACTION: wheezing   Shellfish Allergy Anaphylaxis, Hives, Shortness Of Breath and Other (See Comments)    All seafood   Shellfish-Derived Products Anaphylaxis and Hives   Other Itching and Other (See Comments)    Patient reports allergy to perfumed detergents. Causes itching.   Metformin Nausea Only and Nausea And Vomiting    Social History   Socioeconomic History   Marital status: Married    Spouse name: Not on file   Number of children: Not on file   Years of education: Not on file   Highest education level: Not on file  Occupational History   Not on file  Social Needs   Financial resource strain: Not on file   Food insecurity    Worry: Not on file    Inability: Not on file  Transportation needs    Medical: Not on file    Non-medical: Not on file  Tobacco Use   Smoking status: Never Smoker   Smokeless tobacco: Never Used  Substance and Sexual Activity   Alcohol use: No    Comment: rare glass of wine    Drug use: No   Sexual activity: Not on file  Lifestyle   Physical activity    Days per week: Not  on file    Minutes per session: Not on file   Stress: Not on file  Relationships   Social connections    Talks on phone: Not on file    Gets together: Not on file    Attends religious service: Not on file    Active member of club or organization: Not on file    Attends meetings of clubs or organizations: Not on file    Relationship status: Not on file   Intimate partner violence    Fear of current or ex partner: Not on file    Emotionally abused: Not on file    Physically abused: Not on file    Forced sexual activity: Not on file  Other Topics Concern   Not on file  Social History Narrative    She is a married mother of 63, grandmother of 15, great grandmother of 51.    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    She does not really get a lot of exercise,    She does not smoke or drink EtOH.     Review of Systems: General: negative for chills, fever, night sweats or weight changes.  Cardiovascular: negative for chest pain, dyspnea on exertion, edema, orthopnea, palpitations, paroxysmal nocturnal dyspnea or shortness of breath Dermatological: negative for rash Respiratory: negative for cough or wheezing Urologic: negative for hematuria Abdominal: negative for nausea, vomiting, diarrhea, bright red blood per rectum, melena, or hematemesis Neurologic: negative for visual changes, syncope, or dizziness All other systems reviewed and are otherwise negative except as noted above.    Blood pressure 132/62, pulse 76, temperature (!) 97.1 F (36.2 C), height 5\' 3"  (1.6 m), weight 157 lb (71.2 kg).  General appearance: alert and no distress Neck: no adenopathy, no carotid bruit, no JVD, supple, symmetrical, trachea midline and thyroid not enlarged, symmetric, no tenderness/mass/nodules Lungs: clear to auscultation bilaterally Heart: regular rate and rhythm, S1, S2 normal, no murmur, click, rub or gallop Extremities: extremities normal, atraumatic, no cyanosis or  edema Pulses: 1+ left pedal pulse, diminished right pedal pulses Skin: Skin color, texture, turgor normal. No rashes or lesions Neurologic: Alert and oriented X 3, normal strength and tone. Normal symmetric reflexes. Normal coordination and gait  EKG sinus rhythm at 76 with early R wave transition.  I personally reviewed this EKG.  ASSESSMENT AND PLAN:   PAD,severe calcified pop and tibial peroneal trunk disease bilateraly History of peripheral arterial disease status post diamondback orbital rotational atherectomy, PTA of a high-grade calcified distal right SFA with stenting of her right peroneal proximally and angioplasty in the midportion with one-vessel runoff.  Because of complexity of disease after I restudied her 07/20/2013 I referred her to Dr. Brunetta Jeans at [facility] who performed complex intervention of her left popliteal and tibioperoneal trunk on 10/01/2013.  Her follow-up Dopplers improved.  Her last Dopplers a year ago 03/10/2018 revealed her stents to be widely patent and her ABIs normal however her most recent Doppler suggested progression of disease in her right popliteal  artery with a decline in her right ABI down 2.75.  Her symptoms are really atypical for claudication.  She has pain at rest left greater than right and she has an intact left pedal pulse.  At this point, I do not think she has symptoms related to vascular insufficiency.      Lorretta Harp MD FACP,FACC,FAHA, [facility] 06/26/2019 2:54 PM

## 2019-06-26 NOTE — Patient Instructions (Addendum)
Medication Instructions:  Your physician has recommended you make the following change in your medication:   YOU MAY TAKE 500 MGOF ACETAMINOPHEN DAILY AS NEEDED FOR PAIN. PLEASE SCHEDULE AN APPOINTMENT WITH YOUR PRIMARY CARE PROVIDER AS SOON AS POSSIBLE TO DISCUSS STARTING YOU  ON MEDICATION FOR PAIN MANAGEMENT.   If you need a refill on your cardiac medications before your next appointment, please call your pharmacy.   Lab work: NONE If you have labs (blood work) drawn today and your tests are completely normal, you will receive your results only by: Marland Kitchen MyChart Message (if you have MyChart) OR . A paper copy in the mail If you have any lab test that is abnormal or we need to change your treatment, we will call you to review the results.  Testing/Procedures: Your physician has requested that you have a lower or upper extremity arterial duplex. This test is an ultrasound of the arteries in the legs or arms. It looks at arterial blood flow in the legs and arms. Allow one hour for Lower and Upper Arterial scans. There are no restrictions or special instructions . TO BE COMPLETED IN 12 MONTHS  Your physician has requested that you have an ankle brachial index (ABI). During this test an ultrasound and blood pressure cuff are used to evaluate the arteries that supply the arms and legs with blood. Allow thirty minutes for this exam. There are no restrictions or special instructions.  TO BE SCOMPLETED IN 12 MONTHS    Follow-Up: At [facility], you and your health needs are our priority.  As part of our continuing mission to provide you with exceptional heart care, we have created designated Provider Care Teams.  These Care Teams include your primary Cardiologist (physician) and Advanced Practice Providers (APPs -  Physician Assistants and Nurse Practitioners) who all work together to provide you with the care you need, when you need it. You will need a follow up appointment in 12 months WITH  DR. Gwenlyn Found.  Please call our office 2 months in advance to schedule this appointment. PLEASE HAVE YOUR ULTRASOUNDS FOR YOUR LEGS COMPLETED BEFORE THIS APPOINTMENT.   ADDITIONAL INFORMATION:

## 2019-06-26 NOTE — Assessment & Plan Note (Signed)
History of peripheral arterial disease status post diamondback orbital rotational atherectomy, PTA of a high-grade calcified distal right SFA with stenting of her right peroneal proximally and angioplasty in the midportion with one-vessel runoff.  Because of complexity of disease after I restudied her 07/20/2013 I referred her to Dr. Brunetta Jeans at [facility] who performed complex intervention of her left popliteal and tibioperoneal trunk on 10/01/2013.  Her follow-up Dopplers improved.  Her last Dopplers a year ago 03/10/2018 revealed her stents to be widely patent and her ABIs normal however her most recent Doppler suggested progression of disease in her right popliteal artery with a decline in her right ABI down 2.75.  Her symptoms are really atypical for claudication.  She has pain at rest left greater than right and she has an intact left pedal pulse.  At this point, I do not think she has symptoms related to vascular insufficiency.

## 2019-06-27 ENCOUNTER — Ambulatory Visit: Payer: Medicare Other | Admitting: Cardiovascular Disease

## 2019-07-02 DIAGNOSIS — D649 Anemia, unspecified: Secondary | ICD-10-CM | POA: Diagnosis not present

## 2019-07-02 DIAGNOSIS — E7800 Pure hypercholesterolemia, unspecified: Secondary | ICD-10-CM | POA: Diagnosis not present

## 2019-07-02 DIAGNOSIS — E118 Type 2 diabetes mellitus with unspecified complications: Secondary | ICD-10-CM | POA: Diagnosis not present

## 2019-07-03 ENCOUNTER — Encounter: Payer: Self-pay | Admitting: Orthopaedic Surgery

## 2019-07-03 ENCOUNTER — Ambulatory Visit (INDEPENDENT_AMBULATORY_CARE_PROVIDER_SITE_OTHER): Payer: Medicare Other

## 2019-07-03 ENCOUNTER — Ambulatory Visit (INDEPENDENT_AMBULATORY_CARE_PROVIDER_SITE_OTHER): Payer: Medicare Other | Admitting: Orthopaedic Surgery

## 2019-07-03 ENCOUNTER — Other Ambulatory Visit: Payer: Self-pay | Admitting: Orthopaedic Surgery

## 2019-07-03 DIAGNOSIS — Z96641 Presence of right artificial hip joint: Secondary | ICD-10-CM

## 2019-07-03 DIAGNOSIS — I6523 Occlusion and stenosis of bilateral carotid arteries: Secondary | ICD-10-CM

## 2019-07-03 MED ORDER — HYDROCODONE-ACETAMINOPHEN 5-325 MG PO TABS: 1.0000 | ORAL_TABLET | Freq: Four times a day (QID) | ORAL | 0 refills | Status: DC | PRN

## 2019-07-03 NOTE — Progress Notes (Signed)
HPI: [name] returns today 40-month status post right total hip arthroplasty.  She states she still has some issues with her shoe insert due to her leg length discrepancy with the left leg being shorter than the right.  She is working with [employer] on this.  Regards to her right hip pain is better than what she had prior to surgery but still has pain about the hip.  She ambulates with a cane.  She is currently dealing with her husband who has had a large stroke he also has dementia and is in a skilled nursing facility.  She also needs to have some type of eye surgery.  Currently is not wishing to proceed with any type of left hip surgery.  Review of systems: Please see HPI otherwise negative or noncontributory.  Physical exam: General: Well-developed well-nourished female no acute distress mood and affect appropriate ambulates with a cane with slight antalgic gait.  Bilateral hips good range of motion right hip without pain.  Left hip good range of motion with pain with internal rotation.  Impression: 9 months status post right total hip arthroplasty Left hip osteoarthritis  Plan: At this point time we will see her back as needed.  She will continue to wear a lift in her left shoe.  Definitely trying to make her leg lengths equal when and if she undergoes a left total hip arthroplasty.  Questions were encouraged and answered at length.

## 2019-07-05 DIAGNOSIS — Z0000 Encounter for general adult medical examination without abnormal findings: Secondary | ICD-10-CM | POA: Diagnosis not present

## 2019-07-05 DIAGNOSIS — E789 Disorder of lipoprotein metabolism, unspecified: Secondary | ICD-10-CM | POA: Diagnosis not present

## 2019-07-05 DIAGNOSIS — I10 Essential (primary) hypertension: Secondary | ICD-10-CM | POA: Diagnosis not present

## 2019-07-05 DIAGNOSIS — I2510 Atherosclerotic heart disease of native coronary artery without angina pectoris: Secondary | ICD-10-CM | POA: Diagnosis not present

## 2019-07-05 DIAGNOSIS — E1142 Type 2 diabetes mellitus with diabetic polyneuropathy: Secondary | ICD-10-CM | POA: Diagnosis not present

## 2019-07-18 DIAGNOSIS — Z227 Latent tuberculosis: Secondary | ICD-10-CM | POA: Diagnosis not present

## 2019-07-18 DIAGNOSIS — M0609 Rheumatoid arthritis without rheumatoid factor, multiple sites: Secondary | ICD-10-CM | POA: Diagnosis not present

## 2019-07-18 DIAGNOSIS — D649 Anemia, unspecified: Secondary | ICD-10-CM | POA: Diagnosis not present

## 2019-07-18 DIAGNOSIS — Z13820 Encounter for screening for osteoporosis: Secondary | ICD-10-CM | POA: Diagnosis not present

## 2019-07-18 DIAGNOSIS — E1169 Type 2 diabetes mellitus with other specified complication: Secondary | ICD-10-CM | POA: Diagnosis not present

## 2019-07-18 DIAGNOSIS — M1990 Unspecified osteoarthritis, unspecified site: Secondary | ICD-10-CM | POA: Diagnosis not present

## 2019-07-18 DIAGNOSIS — Z79899 Other long term (current) drug therapy: Secondary | ICD-10-CM | POA: Diagnosis not present

## 2019-09-10 DIAGNOSIS — E118 Type 2 diabetes mellitus with unspecified complications: Secondary | ICD-10-CM | POA: Diagnosis not present

## 2019-09-10 DIAGNOSIS — F419 Anxiety disorder, unspecified: Secondary | ICD-10-CM | POA: Diagnosis not present

## 2019-09-10 DIAGNOSIS — M25512 Pain in left shoulder: Secondary | ICD-10-CM | POA: Diagnosis not present

## 2019-09-10 DIAGNOSIS — Z23 Encounter for immunization: Secondary | ICD-10-CM | POA: Diagnosis not present

## 2019-09-18 DIAGNOSIS — M0609 Rheumatoid arthritis without rheumatoid factor, multiple sites: Secondary | ICD-10-CM | POA: Diagnosis not present

## 2019-09-18 DIAGNOSIS — Z227 Latent tuberculosis: Secondary | ICD-10-CM | POA: Diagnosis not present

## 2019-09-18 DIAGNOSIS — M1990 Unspecified osteoarthritis, unspecified site: Secondary | ICD-10-CM | POA: Diagnosis not present

## 2019-09-18 DIAGNOSIS — D649 Anemia, unspecified: Secondary | ICD-10-CM | POA: Diagnosis not present

## 2019-09-18 DIAGNOSIS — Z79899 Other long term (current) drug therapy: Secondary | ICD-10-CM | POA: Diagnosis not present

## 2019-09-18 DIAGNOSIS — M19012 Primary osteoarthritis, left shoulder: Secondary | ICD-10-CM | POA: Diagnosis not present

## 2019-09-18 DIAGNOSIS — Z13820 Encounter for screening for osteoporosis: Secondary | ICD-10-CM | POA: Diagnosis not present

## 2019-09-18 DIAGNOSIS — M25512 Pain in left shoulder: Secondary | ICD-10-CM | POA: Diagnosis not present

## 2019-09-18 DIAGNOSIS — M25511 Pain in right shoulder: Secondary | ICD-10-CM | POA: Diagnosis not present

## 2019-09-18 DIAGNOSIS — E1169 Type 2 diabetes mellitus with other specified complication: Secondary | ICD-10-CM | POA: Diagnosis not present

## 2019-09-18 DIAGNOSIS — M542 Cervicalgia: Secondary | ICD-10-CM | POA: Diagnosis not present

## 2019-10-01 DIAGNOSIS — D649 Anemia, unspecified: Secondary | ICD-10-CM | POA: Diagnosis not present

## 2019-10-01 DIAGNOSIS — R55 Syncope and collapse: Secondary | ICD-10-CM | POA: Diagnosis not present

## 2019-10-01 DIAGNOSIS — E1142 Type 2 diabetes mellitus with diabetic polyneuropathy: Secondary | ICD-10-CM | POA: Diagnosis not present

## 2019-10-01 DIAGNOSIS — R202 Paresthesia of skin: Secondary | ICD-10-CM | POA: Diagnosis not present

## 2019-10-01 DIAGNOSIS — E118 Type 2 diabetes mellitus with unspecified complications: Secondary | ICD-10-CM | POA: Diagnosis not present

## 2019-10-03 DIAGNOSIS — I5032 Chronic diastolic (congestive) heart failure: Secondary | ICD-10-CM | POA: Diagnosis not present

## 2019-10-03 DIAGNOSIS — E118 Type 2 diabetes mellitus with unspecified complications: Secondary | ICD-10-CM | POA: Diagnosis not present

## 2019-10-03 DIAGNOSIS — I10 Essential (primary) hypertension: Secondary | ICD-10-CM | POA: Diagnosis not present

## 2019-10-03 DIAGNOSIS — I739 Peripheral vascular disease, unspecified: Secondary | ICD-10-CM | POA: Diagnosis not present

## 2019-10-03 DIAGNOSIS — Z6828 Body mass index (BMI) 28.0-28.9, adult: Secondary | ICD-10-CM | POA: Diagnosis not present

## 2019-10-03 DIAGNOSIS — I2510 Atherosclerotic heart disease of native coronary artery without angina pectoris: Secondary | ICD-10-CM | POA: Diagnosis not present

## 2019-10-03 DIAGNOSIS — E1169 Type 2 diabetes mellitus with other specified complication: Secondary | ICD-10-CM | POA: Diagnosis not present

## 2019-10-09 ENCOUNTER — Other Ambulatory Visit: Payer: Self-pay | Admitting: Internal Medicine

## 2019-10-09 DIAGNOSIS — R55 Syncope and collapse: Secondary | ICD-10-CM

## 2019-10-16 DIAGNOSIS — M542 Cervicalgia: Secondary | ICD-10-CM | POA: Diagnosis not present

## 2019-10-16 DIAGNOSIS — Z13820 Encounter for screening for osteoporosis: Secondary | ICD-10-CM | POA: Diagnosis not present

## 2019-10-16 DIAGNOSIS — Z227 Latent tuberculosis: Secondary | ICD-10-CM | POA: Diagnosis not present

## 2019-10-16 DIAGNOSIS — R202 Paresthesia of skin: Secondary | ICD-10-CM | POA: Diagnosis not present

## 2019-10-16 DIAGNOSIS — D649 Anemia, unspecified: Secondary | ICD-10-CM | POA: Diagnosis not present

## 2019-10-16 DIAGNOSIS — M0609 Rheumatoid arthritis without rheumatoid factor, multiple sites: Secondary | ICD-10-CM | POA: Diagnosis not present

## 2019-10-16 DIAGNOSIS — M1990 Unspecified osteoarthritis, unspecified site: Secondary | ICD-10-CM | POA: Diagnosis not present

## 2019-10-16 DIAGNOSIS — M25512 Pain in left shoulder: Secondary | ICD-10-CM | POA: Diagnosis not present

## 2019-10-16 DIAGNOSIS — E1169 Type 2 diabetes mellitus with other specified complication: Secondary | ICD-10-CM | POA: Diagnosis not present

## 2019-10-16 DIAGNOSIS — Z79899 Other long term (current) drug therapy: Secondary | ICD-10-CM | POA: Diagnosis not present

## 2019-10-22 DIAGNOSIS — R55 Syncope and collapse: Secondary | ICD-10-CM | POA: Diagnosis not present

## 2019-10-22 DIAGNOSIS — E118 Type 2 diabetes mellitus with unspecified complications: Secondary | ICD-10-CM | POA: Diagnosis not present

## 2019-10-22 DIAGNOSIS — R202 Paresthesia of skin: Secondary | ICD-10-CM | POA: Diagnosis not present

## 2019-10-22 DIAGNOSIS — D649 Anemia, unspecified: Secondary | ICD-10-CM | POA: Diagnosis not present

## 2019-10-22 DIAGNOSIS — I10 Essential (primary) hypertension: Secondary | ICD-10-CM | POA: Diagnosis not present

## 2019-10-26 DIAGNOSIS — R300 Dysuria: Secondary | ICD-10-CM | POA: Diagnosis not present

## 2019-10-26 DIAGNOSIS — E118 Type 2 diabetes mellitus with unspecified complications: Secondary | ICD-10-CM | POA: Diagnosis not present

## 2019-10-27 ENCOUNTER — Other Ambulatory Visit: Payer: Medicare Other

## 2019-10-31 DIAGNOSIS — I5032 Chronic diastolic (congestive) heart failure: Secondary | ICD-10-CM | POA: Diagnosis not present

## 2019-10-31 DIAGNOSIS — I739 Peripheral vascular disease, unspecified: Secondary | ICD-10-CM | POA: Diagnosis not present

## 2019-10-31 DIAGNOSIS — I2510 Atherosclerotic heart disease of native coronary artery without angina pectoris: Secondary | ICD-10-CM | POA: Diagnosis not present

## 2019-10-31 DIAGNOSIS — E118 Type 2 diabetes mellitus with unspecified complications: Secondary | ICD-10-CM | POA: Diagnosis not present

## 2019-10-31 DIAGNOSIS — I10 Essential (primary) hypertension: Secondary | ICD-10-CM | POA: Diagnosis not present

## 2019-10-31 DIAGNOSIS — Z6828 Body mass index (BMI) 28.0-28.9, adult: Secondary | ICD-10-CM | POA: Diagnosis not present

## 2019-10-31 DIAGNOSIS — E1169 Type 2 diabetes mellitus with other specified complication: Secondary | ICD-10-CM | POA: Diagnosis not present

## 2019-11-01 DIAGNOSIS — I5032 Chronic diastolic (congestive) heart failure: Secondary | ICD-10-CM | POA: Diagnosis not present

## 2019-11-01 DIAGNOSIS — R55 Syncope and collapse: Secondary | ICD-10-CM | POA: Diagnosis not present

## 2019-11-01 DIAGNOSIS — I6523 Occlusion and stenosis of bilateral carotid arteries: Secondary | ICD-10-CM | POA: Diagnosis not present

## 2019-11-17 ENCOUNTER — Emergency Department (HOSPITAL_COMMUNITY): Payer: No Typology Code available for payment source

## 2019-11-17 ENCOUNTER — Ambulatory Visit
Admission: RE | Admit: 2019-11-17 | Discharge: 2019-11-17 | Disposition: A | Discharge status: Home / Self Care | Payer: Medicare Other | Source: Ambulatory Visit | Attending: Internal Medicine | Admitting: Internal Medicine

## 2019-11-17 ENCOUNTER — Other Ambulatory Visit: Payer: Self-pay

## 2019-11-17 ENCOUNTER — Encounter (HOSPITAL_COMMUNITY): Payer: Self-pay | Admitting: Emergency Medicine

## 2019-11-17 ENCOUNTER — Emergency Department (HOSPITAL_COMMUNITY)
Admission: EM | Admit: 2019-11-17 | Discharge: 2019-11-17 | Disposition: A | Discharge status: Home / Self Care | Payer: No Typology Code available for payment source | Attending: Emergency Medicine | Admitting: Emergency Medicine

## 2019-11-17 DIAGNOSIS — R0781 Pleurodynia: Secondary | ICD-10-CM | POA: Diagnosis not present

## 2019-11-17 DIAGNOSIS — I252 Old myocardial infarction: Secondary | ICD-10-CM | POA: Insufficient documentation

## 2019-11-17 DIAGNOSIS — E119 Type 2 diabetes mellitus without complications: Secondary | ICD-10-CM | POA: Diagnosis not present

## 2019-11-17 DIAGNOSIS — Z96641 Presence of right artificial hip joint: Secondary | ICD-10-CM | POA: Insufficient documentation

## 2019-11-17 DIAGNOSIS — R55 Syncope and collapse: Secondary | ICD-10-CM

## 2019-11-17 DIAGNOSIS — M7918 Myalgia, other site: Secondary | ICD-10-CM | POA: Diagnosis not present

## 2019-11-17 DIAGNOSIS — S299XXA Unspecified injury of thorax, initial encounter: Secondary | ICD-10-CM | POA: Diagnosis not present

## 2019-11-17 DIAGNOSIS — R42 Dizziness and giddiness: Secondary | ICD-10-CM | POA: Diagnosis not present

## 2019-11-17 DIAGNOSIS — I110 Hypertensive heart disease with heart failure: Secondary | ICD-10-CM | POA: Diagnosis not present

## 2019-11-17 DIAGNOSIS — Z79899 Other long term (current) drug therapy: Secondary | ICD-10-CM | POA: Diagnosis not present

## 2019-11-17 DIAGNOSIS — Y999 Unspecified external cause status: Secondary | ICD-10-CM | POA: Insufficient documentation

## 2019-11-17 DIAGNOSIS — S32591A Other specified fracture of right pubis, initial encounter for closed fracture: Secondary | ICD-10-CM | POA: Diagnosis not present

## 2019-11-17 DIAGNOSIS — Z91040 Latex allergy status: Secondary | ICD-10-CM | POA: Insufficient documentation

## 2019-11-17 DIAGNOSIS — I5032 Chronic diastolic (congestive) heart failure: Secondary | ICD-10-CM | POA: Insufficient documentation

## 2019-11-17 DIAGNOSIS — Z7901 Long term (current) use of anticoagulants: Secondary | ICD-10-CM | POA: Diagnosis not present

## 2019-11-17 DIAGNOSIS — Y9389 Activity, other specified: Secondary | ICD-10-CM | POA: Diagnosis not present

## 2019-11-17 DIAGNOSIS — S3991XA Unspecified injury of abdomen, initial encounter: Secondary | ICD-10-CM | POA: Diagnosis not present

## 2019-11-17 DIAGNOSIS — R0789 Other chest pain: Secondary | ICD-10-CM

## 2019-11-17 DIAGNOSIS — M79651 Pain in right thigh: Secondary | ICD-10-CM | POA: Diagnosis not present

## 2019-11-17 DIAGNOSIS — Y92410 Unspecified street and highway as the place of occurrence of the external cause: Secondary | ICD-10-CM | POA: Insufficient documentation

## 2019-11-17 DIAGNOSIS — S79921A Unspecified injury of right thigh, initial encounter: Secondary | ICD-10-CM | POA: Diagnosis not present

## 2019-11-17 LAB — BASIC METABOLIC PANEL
Anion gap: 8 (ref 5–15)
BUN: 18 mg/dL (ref 8–23)
CO2: 31 mmol/L (ref 22–32)
Calcium: 9.4 mg/dL (ref 8.9–10.3)
Chloride: 103 mmol/L (ref 98–111)
Creatinine, Ser: 0.97 mg/dL (ref 0.44–1.00)
GFR calc Af Amer: >60 mL/min (ref >60)
GFR calc non Af Amer: 54 mL/min — ABNORMAL LOW (ref >60)
Glucose, Bld: 137 mg/dL — ABNORMAL HIGH (ref 70–99)
Potassium: 3.8 mmol/L (ref 3.5–5.1)
Sodium: 142 mmol/L (ref 135–145)

## 2019-11-17 LAB — CBC WITH DIFFERENTIAL/PLATELET
Abs Immature Granulocytes: 0.04 10*3/uL (ref 0.00–0.07)
Basophils Absolute: 0 10*3/uL (ref 0.0–0.1)
Basophils Relative: 0 %
Eosinophils Absolute: 0.1 10*3/uL (ref 0.0–0.5)
Eosinophils Relative: 1 %
HCT: 38.9 % (ref 36.0–46.0)
Hemoglobin: 12.1 g/dL (ref 12.0–15.0)
Immature Granulocytes: 1 %
Lymphocytes Relative: 23 %
Lymphs Abs: 1.7 10*3/uL (ref 0.7–4.0)
MCH: 28.7 pg (ref 26.0–34.0)
MCHC: 31.1 g/dL (ref 30.0–36.0)
MCV: 92.4 fL (ref 80.0–100.0)
Monocytes Absolute: 0.6 10*3/uL (ref 0.1–1.0)
Monocytes Relative: 8 %
Neutro Abs: 4.8 10*3/uL (ref 1.7–7.7)
Neutrophils Relative %: 67 %
Platelets: 249 10*3/uL (ref 150–400)
RBC: 4.21 MIL/uL (ref 3.87–5.11)
RDW: 13.8 % (ref 11.5–15.5)
WBC: 7.3 10*3/uL (ref 4.0–10.5)
nRBC: 0 % (ref 0.0–0.2)

## 2019-11-17 MED ORDER — TRAMADOL HCL 50 MG PO TABS: 50.0000 mg | ORAL_TABLET | Freq: Three times a day (TID) | ORAL | 0 refills | Status: DC | PRN

## 2019-11-17 MED ORDER — HYDROCODONE-ACETAMINOPHEN 5-325 MG PO TABS
1.0000 | ORAL_TABLET | Freq: Once | ORAL | Status: AC
  Administered 2019-11-17: 1 via ORAL
  Filled 2019-11-17: qty 1

## 2019-11-17 MED ORDER — LIDOCAINE 5 % EX PTCH
1.0000 | MEDICATED_PATCH | CUTANEOUS | Status: DC
  Administered 2019-11-17: 1 via TRANSDERMAL
  Filled 2019-11-17: qty 1

## 2019-11-17 NOTE — Discharge Instructions (Addendum)
Return here as needed. Follow up with your doctor for a recheck. °

## 2019-11-17 NOTE — ED Notes (Signed)
Patient transported to CT 
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
## 2019-11-22 ENCOUNTER — Encounter: Payer: Medicare Other | Admitting: Diagnostic Neuroimaging

## 2019-11-22 ENCOUNTER — Encounter: Payer: Self-pay | Admitting: Diagnostic Neuroimaging

## 2019-12-18 DIAGNOSIS — Z79899 Other long term (current) drug therapy: Secondary | ICD-10-CM | POA: Diagnosis not present

## 2019-12-18 DIAGNOSIS — Z227 Latent tuberculosis: Secondary | ICD-10-CM | POA: Diagnosis not present

## 2019-12-18 DIAGNOSIS — M1990 Unspecified osteoarthritis, unspecified site: Secondary | ICD-10-CM | POA: Diagnosis not present

## 2019-12-18 DIAGNOSIS — E1169 Type 2 diabetes mellitus with other specified complication: Secondary | ICD-10-CM | POA: Diagnosis not present

## 2019-12-18 DIAGNOSIS — M79643 Pain in unspecified hand: Secondary | ICD-10-CM | POA: Diagnosis not present

## 2019-12-18 DIAGNOSIS — M7989 Other specified soft tissue disorders: Secondary | ICD-10-CM | POA: Diagnosis not present

## 2019-12-18 DIAGNOSIS — M0609 Rheumatoid arthritis without rheumatoid factor, multiple sites: Secondary | ICD-10-CM | POA: Diagnosis not present

## 2019-12-18 DIAGNOSIS — M25512 Pain in left shoulder: Secondary | ICD-10-CM | POA: Diagnosis not present

## 2019-12-18 DIAGNOSIS — D649 Anemia, unspecified: Secondary | ICD-10-CM | POA: Diagnosis not present

## 2019-12-18 DIAGNOSIS — R202 Paresthesia of skin: Secondary | ICD-10-CM | POA: Diagnosis not present

## 2019-12-18 DIAGNOSIS — Z13820 Encounter for screening for osteoporosis: Secondary | ICD-10-CM | POA: Diagnosis not present

## 2019-12-21 ENCOUNTER — Other Ambulatory Visit: Payer: Self-pay | Admitting: Cardiology

## 2019-12-27 DIAGNOSIS — M79674 Pain in right toe(s): Secondary | ICD-10-CM | POA: Diagnosis not present

## 2019-12-27 DIAGNOSIS — E119 Type 2 diabetes mellitus without complications: Secondary | ICD-10-CM | POA: Diagnosis not present

## 2019-12-27 DIAGNOSIS — L600 Ingrowing nail: Secondary | ICD-10-CM | POA: Diagnosis not present

## 2019-12-27 DIAGNOSIS — B351 Tinea unguium: Secondary | ICD-10-CM | POA: Diagnosis not present

## 2020-01-09 DIAGNOSIS — Z961 Presence of intraocular lens: Secondary | ICD-10-CM | POA: Diagnosis not present

## 2020-01-09 DIAGNOSIS — H21343 Primary cyst of pars plana, bilateral: Secondary | ICD-10-CM | POA: Diagnosis not present

## 2020-01-09 DIAGNOSIS — H43812 Vitreous degeneration, left eye: Secondary | ICD-10-CM | POA: Diagnosis not present

## 2020-01-09 DIAGNOSIS — H25812 Combined forms of age-related cataract, left eye: Secondary | ICD-10-CM | POA: Diagnosis not present

## 2020-01-09 DIAGNOSIS — H33312 Horseshoe tear of retina without detachment, left eye: Secondary | ICD-10-CM | POA: Diagnosis not present

## 2020-01-17 ENCOUNTER — Ambulatory Visit (INDEPENDENT_AMBULATORY_CARE_PROVIDER_SITE_OTHER): Payer: Medicare Other | Admitting: Diagnostic Neuroimaging

## 2020-01-17 ENCOUNTER — Encounter (INDEPENDENT_AMBULATORY_CARE_PROVIDER_SITE_OTHER): Payer: Medicare Other | Admitting: Diagnostic Neuroimaging

## 2020-01-17 ENCOUNTER — Other Ambulatory Visit: Payer: Self-pay

## 2020-01-17 DIAGNOSIS — R200 Anesthesia of skin: Secondary | ICD-10-CM

## 2020-01-17 DIAGNOSIS — Z0289 Encounter for other administrative examinations: Secondary | ICD-10-CM

## 2020-01-24 NOTE — Procedures (Signed)
[facility]  NCS (NERVE CONDUCTION STUDY) WITH EMG (ELECTROMYOGRAPHY) REPORT   STUDY DATE: 01/17/20 PATIENT NAME: [name] DOB: 1933-01-09 MRN: [id number]  ORDERING CLINICIAN: Georges Mouse  TECHNOLOGIST: Sherre Scarlet ELECTROMYOGRAPHER: Earlean Polka. Idara Woodside, MD  CLINICAL INFORMATION: 86 year old female with numbness in hands.  FINDINGS: NERVE CONDUCTION STUDY:  Bilateral median and ulnar motor responses are normal.  Bilateral median and ulnar sensory responses are normal.  Left median to ulnar transcarpal mixed nerve comparison is normal.  Right median to ulnar transcarpal mixed nerve comparison is normal.    NEEDLE ELECTROMYOGRAPHY:  Needle examination of right upper extremity deltoid, biceps, triceps, flexor carpi radialis, first dorsal interosseous is normal.   IMPRESSION:   This a normal study.  No electrodiagnostic evidence of large fiber neuropathy.    INTERPRETING PHYSICIAN:  Penni Bombard, MD Certified in Neurology, Neurophysiology and Neuroimaging  [facility] [address], [place], [place] [address] [phone]   [facility]    Nerve / Sites Muscle Latency Ref. Amplitude Ref. Rel Amp Segments Distance Velocity Ref. Area    ms ms mV mV %  cm m/s m/s mVms  L Median - APB     Wrist APB 3.9 ?4.4 4.1 ?4.0 100 Wrist - APB 7   15.3     Upper arm APB 8.1  3.6  86.3 Upper arm - Wrist 23 55 ?49 12.7  R Median - APB     Wrist APB 4.4 ?4.4 5.3 ?4.0 100 Wrist - APB 7   19.2     Upper arm APB 8.6  4.9  91.5 Upper arm - Wrist 22 53 ?49 18.6  L Ulnar - ADM     Wrist ADM 2.8 ?3.3 8.0 ?6.0 100 Wrist - ADM 7   28.2     B.Elbow ADM 6.4  7.9  98.1 B.Elbow - Wrist 20 56 ?49 28.1     A.Elbow ADM 8.3  7.3  93.1 A.Elbow - B.Elbow 10 53 ?49 27.1         A.Elbow - Wrist      R Ulnar - ADM     Wrist ADM 2.5 ?3.3 9.4 ?6.0 100 Wrist - ADM 7   26.7     B.Elbow ADM 6.1  8.4  88.6 B.Elbow - Wrist 20 55 ?49 26.5     A.Elbow ADM 8.1  7.9   94.4 A.Elbow - B.Elbow 10 49 ?49 26.3         A.Elbow - Wrist                 SNC    Nerve / Sites Rec. Site Peak Lat Ref.  Amp Ref. Segments Distance Peak Diff Ref.    ms ms V V  cm ms ms  L Median, Ulnar - Transcarpal comparison     Median Palm Wrist 2.3 ?2.2 59 ?35 Median Palm - Wrist 8       Ulnar Palm Wrist 2.0 ?2.2 29 ?12 Ulnar Palm - Wrist 8          Median Palm - Ulnar Palm  0.2 ?0.4  R Median, Ulnar - Transcarpal comparison     Median Palm Wrist 2.5 ?2.2 55 ?35 Median Palm - Wrist 8       Ulnar Palm Wrist 2.1 ?2.2 14 ?12 Ulnar Palm - Wrist 8          Median Palm - Ulnar Palm  0.4 ?0.4  L Median - Orthodromic (  Dig II, Mid palm)     Dig II Wrist 3.4 ?3.4 12 ?10 Dig II - Wrist 13    R Median - Orthodromic (Dig II, Mid palm)     Dig II Wrist 3.4 ?3.4 10 ?10 Dig II - Wrist 13    L Ulnar - Orthodromic, (Dig V, Mid palm)     Dig V Wrist 2.8 ?3.1 11 ?5 Dig V - Wrist 11    R Ulnar - Orthodromic, (Dig V, Mid palm)     Dig V Wrist 2.5 ?3.1 5 ?5 Dig V - Wrist 64                   F  Wave    Nerve F Lat Ref.   ms ms  L Ulnar - ADM 29.2 ?32.0  R Ulnar - ADM 29.6 ?32.0         EMG Summary Table    Spontaneous MUAP Recruitment  Muscle IA Fib PSW Fasc Other Amp Dur. Poly Pattern  R. Deltoid Normal None None None _______ Normal Normal Normal Normal  R. Biceps brachii Normal None None None _______ Normal Normal Normal Normal  R. Triceps brachii Normal None None None _______ Normal Normal Normal Normal  R. Flexor carpi radialis Normal None None None _______ Normal Normal Normal Normal  R. First dorsal interosseous Normal None None None _______ Normal Normal Normal Normal

## 2020-01-25 DIAGNOSIS — H33312 Horseshoe tear of retina without detachment, left eye: Secondary | ICD-10-CM | POA: Diagnosis not present

## 2020-02-04 DIAGNOSIS — F419 Anxiety disorder, unspecified: Secondary | ICD-10-CM | POA: Diagnosis not present

## 2020-02-04 DIAGNOSIS — E1169 Type 2 diabetes mellitus with other specified complication: Secondary | ICD-10-CM | POA: Diagnosis not present

## 2020-02-04 DIAGNOSIS — E789 Disorder of lipoprotein metabolism, unspecified: Secondary | ICD-10-CM | POA: Diagnosis not present

## 2020-02-04 DIAGNOSIS — I10 Essential (primary) hypertension: Secondary | ICD-10-CM | POA: Diagnosis not present

## 2020-02-04 DIAGNOSIS — E118 Type 2 diabetes mellitus with unspecified complications: Secondary | ICD-10-CM | POA: Diagnosis not present

## 2020-02-04 DIAGNOSIS — Z79899 Other long term (current) drug therapy: Secondary | ICD-10-CM | POA: Diagnosis not present

## 2020-02-06 DIAGNOSIS — R300 Dysuria: Secondary | ICD-10-CM | POA: Diagnosis not present

## 2020-02-06 DIAGNOSIS — R102 Pelvic and perineal pain: Secondary | ICD-10-CM | POA: Diagnosis not present

## 2020-02-11 DIAGNOSIS — I5032 Chronic diastolic (congestive) heart failure: Secondary | ICD-10-CM | POA: Diagnosis not present

## 2020-02-11 DIAGNOSIS — I2510 Atherosclerotic heart disease of native coronary artery without angina pectoris: Secondary | ICD-10-CM | POA: Diagnosis not present

## 2020-02-11 DIAGNOSIS — I739 Peripheral vascular disease, unspecified: Secondary | ICD-10-CM | POA: Diagnosis not present

## 2020-02-11 DIAGNOSIS — E1169 Type 2 diabetes mellitus with other specified complication: Secondary | ICD-10-CM | POA: Diagnosis not present

## 2020-02-11 DIAGNOSIS — E118 Type 2 diabetes mellitus with unspecified complications: Secondary | ICD-10-CM | POA: Diagnosis not present

## 2020-02-11 DIAGNOSIS — I10 Essential (primary) hypertension: Secondary | ICD-10-CM | POA: Diagnosis not present

## 2020-02-11 DIAGNOSIS — Z6828 Body mass index (BMI) 28.0-28.9, adult: Secondary | ICD-10-CM | POA: Diagnosis not present

## 2020-02-14 ENCOUNTER — Encounter: Payer: Self-pay | Admitting: Physician Assistant

## 2020-02-14 ENCOUNTER — Ambulatory Visit (INDEPENDENT_AMBULATORY_CARE_PROVIDER_SITE_OTHER): Payer: Medicare Other

## 2020-02-14 ENCOUNTER — Other Ambulatory Visit: Payer: Self-pay

## 2020-02-14 ENCOUNTER — Ambulatory Visit (INDEPENDENT_AMBULATORY_CARE_PROVIDER_SITE_OTHER): Payer: Medicare Other | Admitting: Physician Assistant

## 2020-02-14 VITALS — Ht 61.0 in | Wt 162.4 lb

## 2020-02-14 DIAGNOSIS — M25552 Pain in left hip: Secondary | ICD-10-CM | POA: Diagnosis not present

## 2020-02-14 DIAGNOSIS — M1612 Unilateral primary osteoarthritis, left hip: Secondary | ICD-10-CM | POA: Diagnosis not present

## 2020-02-14 MED ORDER — TRAMADOL HCL 50 MG PO TABS: 50.0000 mg | ORAL_TABLET | Freq: Three times a day (TID) | ORAL | 0 refills | Status: DC | PRN

## 2020-02-14 NOTE — Progress Notes (Signed)
Office Visit Note   Patient: [name]           Date of Birth: May 06, 1933           MRN: [id number] Visit Date: 02/14/2020              Requested by: Monica Gravel, MD [address] [place],  [place] [address] PCP: Monica Gravel, MD   Assessment & Plan: Visit Diagnoses:  1. Pain in left hip   2. Unilateral primary osteoarthritis, left hip     Plan: Due to patient's recent swelling in her left leg and history of peripheral artery disease recommended she follow-up with Dr. Alvester Flores for evaluation of her swelling.  Also she will need to see cardiology prior to left total hip arthroplasty.  She also needs to be cleared from this ongoing UTI that she has had prior to surgery.  We will see her back sometime in late March versus early April to set her up for a left total hip arthroplasty.  Questions were encouraged and answered both the patient and her daughters present throughout the exam today.  Follow-Up Instructions: Return in about 4 weeks (around 03/13/2020).   Orders:  Orders Placed This Encounter  Procedures  . XR HIP UNILAT W OR W/O PELVIS 2-3 VIEWS LEFT   Meds ordered this encounter  Medications  . traMADol (ULTRAM) 50 MG tablet    Sig: Take 1 tablet (50 mg total) by mouth every 8 (eight) hours as needed for severe pain.    Dispense:  30 tablet    Refill:  0      Procedures: No procedures performed   Clinical Data: No additional findings.   Subjective: Chief Complaint  Patient presents with  . Left Hip - Pain    HPI [name] comes in today due to her left hip pain.  She wants to discuss left total hip arthroplasty.  She had a right total hip arthroplasty 10/12/2018.  She is doing well in regards to the right hip.  She states her pain is worse with ambulation is been worse over the last few days.  Feels as if the hip may give way.  She ambulates with a cane.  She is having also swelling in her left leg involving the thigh down in the the lower leg.   History of peripheral artery disease and seen Dr. Alvester Flores in the past.  She has had no significant swelling in the right leg.  No shortness of breath or chest pain.  She does report a ongoing UTI that she is being treated for.  Review of Systems Please see HPI otherwise negative  Objective: Vital Signs: Ht 5\' 1"  (1.549 m)   Wt 162 lb 6.4 oz (73.7 kg)   BMI 30.69 kg/m   Physical Exam Constitutional:      Appearance: She is not ill-appearing or diaphoretic.  Neurological:     Mental Status: She is alert and oriented to person, place, and time.  Psychiatric:        Mood and Affect: Mood normal.     Ortho Exam Right hip good range of motion without pain.  Left hip she has pain and diminished internal rotation.  Pitting edema left lower leg.  Calf supple no significant tenderness. Specialty Comments:  No specialty comments available.  Imaging: XR HIP UNILAT W OR W/O PELVIS 2-3 VIEWS LEFT  Result Date: 02/14/2020 AP pelvis lateral view of the left hip shows end-stage arthritis of the  left hip.  Right total hip arthroplasty components well-seated.  No evidence of hardware failure.  Both hips are well located.  No acute fractures.    PMFS History: Patient Active Problem List   Diagnosis Date Noted  . Unilateral primary osteoarthritis, left hip 04/03/2019  . History of right hip replacement 04/03/2019  . Status post total replacement of right hip 10/12/2018  . Unilateral primary osteoarthritis, right hip 09/12/2018  . Degenerative joint disease 05/30/2018  . Anal fissure 11/10/2017  . Rectal bleeding 11/10/2017  . Rectal pain 11/10/2017  . Inflammatory arthritis 10/13/2017  . Latent tuberculosis by blood test 10/13/2017  . Chronic diastolic CHF (congestive heart failure), NYHA class 2 ([place]) 08/11/2017  . Fatigue due to treatment 01/09/2017  . NSTEMI (non-ST elevated myocardial infarction) ([place]) 11/01/2016  . Diabetes mellitus ([place]) 10/24/2015  . Non-insulin dependent type 2  diabetes mellitus ([place]) 10/24/2015  . Angina, class I ([place]) 04/22/2015  . DOE (dyspnea on exertion) 04/22/2015  . Bilateral leg edema 04/22/2015  . Accumulation of fluid in tissues 03/21/2014  . Vertigo, central 01/02/2014  . Vertigo 01/02/2014  . Limb pain- echymosis, pain Lt radial artery after cath 12/11/2013  . Dyslipidemia, goal LDL below 70 05/17/2013  . Accelerated hypertension with diastolic congestive heart failure, NYHA class 3 ([place]) 05/17/2013  . Claudication ([place]) 05/16/2013  . PTA/ Stent Rt popliteal and Rt peroneal artery 05/16/13 05/16/2013  . PAD,severe calcified pop and tibial peroneal trunk disease bilateraly   . ABDOMINAL PAIN RIGHT LOWER QUADRANT 10/07/2010  . CVA-STROKE 03/04/2010  . BARRETT'S ESOPHAGUS 03/04/2010  . FECAL INCONTINENCE 03/04/2010  . DM 01/14/2010  . Anemia of chronic disease 01/14/2010  . Atherosclerotic heart disease of native coronary artery with angina pectoris ([place]) 01/14/2010  . DIVERTICULOSIS OF COLON 01/14/2010  . PERSONAL HISTORY OF COLONIC POLYPS 01/14/2010  . [place] ALLERGY 01/14/2010   Past Medical History:  Diagnosis Date  . Atherosclerotic heart disease native coronary artery w/angina pectoris ([place]) 1992   a. 1992 s/p PTCA of LAD;  b. 1998 CABG x 3; c. 07/2001 Cath: Sev LM/LAD/LCX dzs, 3/3 patent grafts; d. 11/2013 Cath: stable graft anatomy; e. 10/2016 Cath: native 3VD, VG->OM nl, LIMA->LAD nl, VG->Diag 55.  . Bradycardia, mild briefly to the 40s, asymptomatic 05/16/2013  . Carotid arterial disease ([place])    a. 05/2014 Carotid U/S: No signif bilat ICA stenosis, >60% L ECA.  Marland Kitchen Chronic anemia   . Chronic diastolic CHF (congestive heart failure) ([place])    a. 10/2016 Echo: Ef 55-60%, no rwma, Gr1 DD, triv AI, PASP 33mmHg, atrial septal aneurysm.  . Chronic diastolic CHF (congestive heart failure), NYHA class 2 ([place]) 08/11/2017  . DM (diabetes mellitus), type 2 with peripheral vascular complications (HCC)    On Oral medications.  .  Essential hypertension   . GERD (gastroesophageal reflux disease)   . Hyperlipidemia with target LDL less than 70   . PAD (peripheral artery disease) ([place]) 05/2013; 09/2013   a. severe calcified POP-1 & TP Trunk Dz bilat;  b . 05/2013 Diamondback Rot Athrectomy (R POP) --> PTA R Pop, DES to R Peroneal;  c. 09/2013 PTA/Stenting of L Pop [name] - retrograde access);  d. 04/2014 ABI's: R/L: 1.1/1.1.  Marland Kitchen Peptic ulcer disease   . S/P CABG x 3 1998   LIMA-LAD, SVG-OM, SVG-D1  . Severe claudication ([place]) 05/2013   Referred for Peripheral Angio (Dr. Gwenlyn Found --> Dr. Brunetta Jeans in [place])    Family History  Problem Relation Age of  Onset  . Hypertension Mother   . Hyperlipidemia Mother   . Hyperlipidemia Father   . Hypertension Father     Past Surgical History:  Procedure Laterality Date  . ATHERECTOMY Right 05/15/2013   Procedure: ATHERECTOMY;  Surgeon: Lorretta Harp, MD;  Location: [facility] CATH LAB;  Service: Cardiovascular;  Laterality: Right;  popliteal  . BACK SURGERY    . CARDIAC CATHETERIZATION  07/17/01   2 v CAD with LM, circ, LAD, obtuse diag, mod stenosis of diag vein graft , mild stenosis of marg vein graft, nl EF, medical treatment  . CARDIAC CATHETERIZATION  11/2013   Widely patent LIMA-LAD & SVG-OM with mild progression of proximal stenosis ~40-50% in SVG-D1; Widely patent native RCA with known severe native LCA disease  . CARDIAC CATHETERIZATION N/A 11/01/2016   Procedure: Left Heart Cath and Cors/Grafts Angiography;  Surgeon: [name], MD;  Location: [place] CV LAB;  Service: Cardiovascular: Hemodynamics: High LVEDP!.  Cors/Grafts: LM 55%, o-p LAD 100% then after D1 -> LIMA-LAD patent, SVG-Diag ~55%. small RI wiht ~70% ostial. O-p Cx 80% - pCx 70% --> SVG-bifurcating OM patent. -- med Rx  . CORONARY ANGIOPLASTY  1992   PCI to LAD  . CORONARY ARTERY BYPASS GRAFT  1998   LIMA-LAD, SVG-diagonal (none roughly 50% stenosis), SVG-OM  . LEFT HEART CATHETERIZATION WITH CORONARY  ANGIOGRAM N/A 11/27/2013   Procedure: LEFT HEART CATHETERIZATION WITH CORONARY ANGIOGRAM;  Surgeon: [name], MD;  Location: [facility] CATH LAB;  Service: Cardiovascular;  Laterality: N/A;  . LOW EXTREMITY DOPPLERS/ABI  10/2016   Patent right SFA, popliteal and peroneal tendons. Patent left SFA and popliteal stent. 30-50% stenosis in the right common femoral and popliteal arteries, 50-74% stenosis in left profunda femoris artery. --> 1 year follow-up.  RABI - 1.2, LABI 1.1  . LOWER EXTREMITY ANGIOGRAM N/A 02/22/2013   Procedure: LOWER EXTREMITY ANGIOGRAM;  Surgeon: Lorretta Harp, MD;  Location: [facility] CATH LAB;  Service: Cardiovascular;  Laterality: N/A;  . LOWER EXTREMITY ANGIOGRAM N/A 07/20/2013   Procedure: LOWER EXTREMITY ANGIOGRAM;  Surgeon: Lorretta Harp, MD;  Location: [facility] CATH LAB;  Service: Cardiovascular;  Laterality: N/A;  . NM MYOVIEW LTD  04/03/2013; 5/26'/2016   Lexiscan: a)  Apical perfusion defect with no ischemia. Consider prior infarct versus breast attenuation.;; b) 5/'16: LOW RISK, Nl EF ~55-65%, No Ischemia /Infarction  . NM MYOVIEW LTD  09/2018   LOW RISK.  EF 55%.  No ischemia or infarction.  Marland Kitchen PERCUTANEOUS STENT INTERVENTION Right 05/15/2013   Procedure: PERCUTANEOUS STENT INTERVENTION;  Surgeon: Lorretta Harp, MD;  Location: [facility] CATH LAB;  Service: Cardiovascular;  Laterality: Right;  tibioperoneal trunk  . Peroneal Artery Stent  05/15/13   Diamondback rot. atherectomy of high grade segmental popliteal stenosis then Chocolate balloonPTA; then angiosculpt PTA of the prox peroneal with stent-Xpedition   . pv angio  07/20/13   high-grade calcified popliteal disease with one-vessel runoff via a small anterior tibial artery that has proximal disease as well, no intervention  . TOTAL HIP ARTHROPLASTY Right 10/12/2018   Procedure: RIGHT TOTAL HIP ARTHROPLASTY ANTERIOR APPROACH;  Surgeon: Mcarthur Rossetti, MD;  Location: [facility] ORS;  Service: Orthopedics;  Laterality: Right;  .  TRANSTHORACIC ECHOCARDIOGRAM  10/2016   EF 55-60%. Gr 1 DD. Normal PAP. Aortic Sclerosis.  . TRANSTHORACIC ECHOCARDIOGRAM  09/2018   Normal LV size and function with vigorous EF 65 to 70%.  GR 1 DD.  Severe LA dilation (suggestive of probably worsening: GR  1 DD).  . TUBAL LIGATION     Social History   Occupational History  . Not on file  Tobacco Use  . Smoking status: Never Smoker  . Smokeless tobacco: Never Used  Substance and Sexual Activity  . Alcohol use: No    Comment: rare glass of wine   . Drug use: No  . Sexual activity: Not on file

## 2020-03-03 DIAGNOSIS — N950 Postmenopausal bleeding: Secondary | ICD-10-CM | POA: Diagnosis not present

## 2020-03-03 DIAGNOSIS — R102 Pelvic and perineal pain: Secondary | ICD-10-CM | POA: Diagnosis not present

## 2020-03-03 DIAGNOSIS — N760 Acute vaginitis: Secondary | ICD-10-CM | POA: Diagnosis not present

## 2020-03-04 ENCOUNTER — Ambulatory Visit (INDEPENDENT_AMBULATORY_CARE_PROVIDER_SITE_OTHER): Payer: Medicare Other | Admitting: Cardiovascular Disease

## 2020-03-04 ENCOUNTER — Encounter: Payer: Self-pay | Admitting: Cardiovascular Disease

## 2020-03-04 ENCOUNTER — Other Ambulatory Visit: Payer: Self-pay

## 2020-03-04 VITALS — BP 132/72 | HR 98 | Ht 61.0 in | Wt 161.0 lb

## 2020-03-04 DIAGNOSIS — I25810 Atherosclerosis of coronary artery bypass graft(s) without angina pectoris: Secondary | ICD-10-CM | POA: Diagnosis not present

## 2020-03-04 DIAGNOSIS — I5030 Unspecified diastolic (congestive) heart failure: Secondary | ICD-10-CM | POA: Diagnosis not present

## 2020-03-04 DIAGNOSIS — Z01818 Encounter for other preprocedural examination: Secondary | ICD-10-CM

## 2020-03-04 DIAGNOSIS — E785 Hyperlipidemia, unspecified: Secondary | ICD-10-CM

## 2020-03-04 DIAGNOSIS — I110 Hypertensive heart disease with heart failure: Secondary | ICD-10-CM | POA: Diagnosis not present

## 2020-03-04 DIAGNOSIS — I739 Peripheral vascular disease, unspecified: Secondary | ICD-10-CM

## 2020-03-04 MED ORDER — FUROSEMIDE 40 MG PO TABS: ORAL_TABLET | ORAL | 1 refills | Status: DC

## 2020-03-04 NOTE — Assessment & Plan Note (Signed)
History of peripheral arterial disease status post diamondback orbital rotational atherectomy of her distal right SFA and intervention on her peroneal artery using a coronary drug-eluting stent 05/16/2013.  I did refer her to Dr. Brunetta Jeans for a complex left popliteal and tibioperoneal trunk intervention 10/01/2013.  Her most recent Doppler studies performed 06/20/2019 revealed a right ABI of 0.76 and a left of 1.36.  She did have a high-frequency signal in her right popliteal artery and a patent left popliteal artery.  She does complain of leg pain which may or may not be claudication.  On the repeat lower extremity arterial Doppler studies.

## 2020-03-04 NOTE — Assessment & Plan Note (Signed)
History of dyslipidemia on statin therapy with lipid profile performed 05/19/2017 revealing a total cholesterol of 138 with an LDL of 73.

## 2020-03-04 NOTE — Patient Instructions (Signed)
Medication Instructions:  TAKE FUROSEMIDE 40 MG ONCE DAILY= 2 OF THE 20 MG TABLETS ONCE DAILY  *If you need a refill on your cardiac medications before your next appointment, please call your pharmacy*   Lab Work: If you have labs (blood work) drawn today and your tests are completely normal, you will receive your results only by: Marland Kitchen MyChart Message (if you have MyChart) OR . A paper copy in the mail If you have any lab test that is abnormal or we need to change your treatment, we will call you to review the results.   Testing/Procedures: Your physician has requested that you have an echocardiogram. Echocardiography is a painless test that uses sound waves to create images of your heart. It provides your doctor with information about the size and shape of your heart and how well your heart's chambers and valves are working. This procedure takes approximately one hour. There are no restrictions for this procedure.[place] has requested that you have a lexiscan myoview. For further information please visit [email or web address]. Please follow instruction sheet, as given.[place] physician has requested that you have a lower extremity arterial duplex. During this test, ultrasound are used to evaluate arterial blood flow in the legs. Allow one hour for this exam. There are no restrictions or special instructions.[facility]  Follow-Up: At [facility], you and your health needs are our priority.  As part of our continuing mission to provide you with exceptional heart care, we have created designated Provider Care Teams.  These Care Teams include your primary Cardiologist (physician) and Advanced Practice Providers (APPs -  Physician Assistants and Nurse Practitioners) who all work together to provide you with the care you need, when you need it.  We recommend signing up for the patient portal called "MyChart".  Sign up information is provided on  this After Visit Summary.  MyChart is used to connect with patients for Virtual Visits (Telemedicine).  Patients are able to view lab/test results, encounter notes, upcoming appointments, etc.  Non-urgent messages can be sent to your provider as well.   To learn more about what you can do with MyChart, go to [email or web address].    Your next appointment:   1 month(s)  The format for your next appointment:   In Person  Provider:   Quay Burow, MD

## 2020-03-04 NOTE — Progress Notes (Signed)
03/04/2020 [name]   06/03/1933  [id number]  Primary Physician Jani Gravel, MD Primary Cardiologist: Lorretta Harp MD [name], [place]  HPI:  [name] is a 86 y.o.  married African American female patient of Dr. Shanon Brow Harding's and Dr. Leigh Aurora.I last saw her in the office  06/26/2019.Marland KitchenShe has known CAD status post coronary bypass grafting in the past (1998). She has preserved LV function and nonischemic Myoview. Because of claudication she underwent peripheral vascular angiography and other [name] 02/22/13 revealing significant popliteal and infrapopliteal disease. She has lifestyle limiting claudication.on 05/16/13 she underwent diamondback orbital rotational atherectomy, PTA of her distal right SFA and stenting using coronary drug-eluting stent for peroneal artery which was her only patent artery on the right. She does not notice any improvement and claudication since her procedure although she says her right leg his less "heavy" and that her left leg is much more symptomatic.I restudied her 07/20/13 with the intention of fixing her left leg however I decided to defer this because of anatomic complexity. Ultimately I referred her to Dr. Brunetta Flores in [place] who performed intervention on her left popliteal and tibioperoneal trunk on 10/01/13. Followup Dopplers performed 04/29/14 were excellent with ABIs of greater than 1 bilaterally, patent peroneal stent on the right a patent left popliteal. Subsequent Dopplers performed in December showed a right ABI of 0.81 and a left ABI 0.91 with patent stents  She had done well after intervention and actually said that she was able to ambulate with claudication. Her last Doppler studies performed 11/10/16 revealed normal ABIs bilaterally with patent SFAs and infrapopliteal vessels. She has noticed some superficial hypersensitivity on her left pretibial area that had venous Doppler studies performed recently that showed no evidence of  DVT.   She did have a right total hip replacement 11/19 which she is still recovering from.  Recent lower extremity arterial Doppler studies performed 06/21/2019 revealed a decline in her right ABI 0..76 with a high-frequency signal in her right popliteal artery and monophasic waveforms below that.  She complains of pain in both legs from the knee down left greater than right at rest and with exertion despite normal left ABIs and a palpable pedal pulse on that side.  Since I saw her in July 2020 she apparently needs an elective left total knee replacement.  She has difficulty ambulating mostly because of pain in her left leg.  She also has chronic lower extremity edema left greater than right on low-dose furosemide which we are going to increase from 20 to 40 mg a day.  She takes occasional sublingual nitroglycerin every other week for chest pain.  She did have a nonischemic Myoview for preoperative clearance before her right total hip replacement 09/15/2018.    Current Meds  Medication Sig  . acetaminophen (TYLENOL) 500 MG tablet Take 1 tablet (500 mg total) by mouth daily as needed for moderate pain. LEG PAIN. PLEASE DISCUSS STARTING A MEDICATION FOR PAIN MANAGEMENT WITH YOUR PCP.  Marland Kitchen carvedilol (COREG) 3.125 MG tablet TAKE 1 TABLET (3.125 MG TOTAL) BY MOUTH 2 (TWO) TIMES DAILY.  Marland Kitchen clopidogrel (PLAVIX) 75 MG tablet Take 75 mg by mouth daily.  . Coenzyme Q10 (CO Q 10 PO) Take 1 tablet by mouth every other day.   . ferrous sulfate 325 (65 FE) MG tablet Take 1 tablet (325 mg total) by mouth daily with breakfast. (Patient taking differently: Take 27 mg by mouth daily with breakfast. )  .  folic acid (FOLVITE) 1 MG tablet Take 1 mg by mouth daily.  . furosemide (LASIX) 40 MG tablet Take 40 Mg (1 tablet) daily  . gabapentin (NEURONTIN) 300 MG capsule TAKE 1 CAPSULE BY MOUTH THREE TIMES A DAY (Patient taking differently: 2 (two) times daily. )  . glimepiride (AMARYL) 1 MG tablet Take 2 mg by mouth daily  with breakfast.   . HYDROcodone-acetaminophen (NORCO/VICODIN) 5-325 MG tablet Take 1 tablet by mouth every 6 (six) hours as needed for moderate pain.  . isosorbide mononitrate (IMDUR) 60 MG 24 hr tablet TAKE 1.5 TABLETS (90 MG TOTAL) BY MOUTH DAILY. .  . LORazepam (ATIVAN) 0.5 MG tablet Take 0.5 mg by mouth every 6 (six) hours as needed for anxiety or sleep.   . methocarbamol (ROBAXIN) 500 MG tablet Take 1 tablet (500 mg total) by mouth every 6 (six) hours as needed for muscle spasms.  . methotrexate (RHEUMATREX) 2.5 MG tablet Take 20 mg by mouth once a week. Every Monday of each week  . nitroGLYCERIN (NITROSTAT) 0.4 MG SL tablet PLACE 1 TABLET (0.4 MG TOTAL) UNDER THE TONGUE EVERY 5 (FIVE) MINUTES AS NEEDED FOR CHEST PAIN.  Marland Kitchen pantoprazole (PROTONIX) 40 MG tablet Take 1 tablet (40 mg total) by mouth 2 (two) times daily before a meal. (Patient taking differently: Take 40 mg by mouth daily. )  . Polyethyl Glycol-Propyl Glycol (SYSTANE ULTRA) 0.4-0.3 % SOLN Place 1 drop into both eyes at bedtime.  . predniSONE (DELTASONE) 5 MG tablet Take 5 mg by mouth daily with breakfast.   . rosuvastatin (CRESTOR) 20 MG tablet Take 20 mg by mouth at bedtime.   . sulfaSALAzine (AZULFIDINE) 500 MG EC tablet Take 1,000 mg by mouth daily.   . traMADol (ULTRAM) 50 MG tablet Take 1 tablet (50 mg total) by mouth every 8 (eight) hours as needed for severe pain.  . [DISCONTINUED] furosemide (LASIX) 40 MG tablet Take 40 Mg (1 tablet) daily     Allergies  Allergen Reactions  . Fish Allergy Hives and Shortness Of Breath  . Ivp Dye [Iodinated Diagnostic Agents] Shortness Of Breath and Anaphylaxis  . Latex Anaphylaxis, Hives, Shortness Of Breath, Itching and Other (See Comments)    REACTION: wheezing  . Shellfish Allergy Anaphylaxis, Hives, Shortness Of Breath and Other (See Comments)    All seafood  . Shellfish-Derived Products Anaphylaxis and Hives  . Other Itching and Other (See Comments)    Patient reports allergy to  perfumed detergents. Causes itching.  . Metformin Nausea Only and Nausea And Vomiting    Social History   Socioeconomic History  . Marital status: Married    Spouse name: Not on file  . Number of children: Not on file  . Years of education: Not on file  . Highest education level: Not on file  Occupational History  . Not on file  Tobacco Use  . Smoking status: Never Smoker  . Smokeless tobacco: Never Used  Substance and Sexual Activity  . Alcohol use: No    Comment: rare glass of wine   . Drug use: No  . Sexual activity: Not on file  Other Topics Concern  . Not on file  Social History Narrative    She is a married mother of 37, grandmother of 71, great grandmother of 13.    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    She does not really get a lot of exercise,    She does not smoke or drink  EtOH.   Social Determinants of Health   Financial Resource Strain:   . Difficulty of Paying Living Expenses:   Food Insecurity:   . Worried About Charity fundraiser in the Last Year:   . Arboriculturist in the Last Year:   Transportation Needs:   . Film/video editor (Medical):   Marland Kitchen Lack of Transportation (Non-Medical):   Physical Activity:   . Days of Exercise per Week:   . Minutes of Exercise per Session:   Stress:   . Feeling of Stress :   Social Connections:   . Frequency of Communication with Friends and Family:   . Frequency of Social Gatherings with Friends and Family:   . Attends Religious Services:   . Active Member of Clubs or Organizations:   . Attends Archivist Meetings:   Marland Kitchen Marital Status:   Intimate Partner Violence:   . Fear of Current or Ex-Partner:   . Emotionally Abused:   Marland Kitchen Physically Abused:   . Sexually Abused:      Review of Systems: General: negative for chills, fever, night sweats or weight changes.  Cardiovascular: negative for chest pain, dyspnea on exertion, edema, orthopnea, palpitations, paroxysmal nocturnal  dyspnea or shortness of breath Dermatological: negative for rash Respiratory: negative for cough or wheezing Urologic: negative for hematuria Abdominal: negative for nausea, vomiting, diarrhea, bright red blood per rectum, melena, or hematemesis Neurologic: negative for visual changes, syncope, or dizziness All other systems reviewed and are otherwise negative except as noted above.    Blood pressure 132/72, pulse 98, height 5\' 1"  (1.549 m), weight 161 lb (73 kg), SpO2 96 %.  General appearance: alert and no distress Neck: no adenopathy, no carotid bruit, no JVD, supple, symmetrical, trachea midline and thyroid not enlarged, symmetric, no tenderness/mass/nodules Lungs: clear to auscultation bilaterally Heart: regular rate and rhythm, S1, S2 normal, no murmur, click, rub or gallop Extremities: extremities normal, atraumatic, no cyanosis or edema Pulses: 2+ and symmetric Skin: Skin color, texture, turgor normal. No rashes or lesions Neurologic: Alert and oriented X 3, normal strength and tone. Normal symmetric reflexes. Normal coordination and gait  EKG sinus rhythm at 98 with nonspecific ST and T wave changes.  I personally reviewed this EKG.  ASSESSMENT AND PLAN:   Atherosclerotic heart disease of native coronary artery with angina pectoris ([place]) History of CAD status post coronary artery bypass grafting in 1998.  She has not had a cardiac catheterization since that time.  She did have a Myoview stress test performed for preoperative clearance 09/15/2018 which was nonischemic and low risk.  She does get occasional chest pain which is nitrate responsive every other week.  She wants to have a left total hip replacement done electively in the next several months.  We will repeat a Lexiscan Myoview stress test to risk stratify her.  PAD,severe calcified pop and tibial peroneal trunk disease bilateraly History of peripheral arterial disease status post diamondback orbital rotational atherectomy  of her distal right SFA and intervention on her peroneal artery using a coronary drug-eluting stent 05/16/2013.  I did refer her to Dr. Brunetta Flores for a complex left popliteal and tibioperoneal trunk intervention 10/01/2013.  Her most recent Doppler studies performed 06/20/2019 revealed a right ABI of 0.76 and a left of 1.36.  She did have a high-frequency signal in her right popliteal artery and a patent left popliteal artery.  She does complain of leg pain which may or may not be claudication.  On  the repeat lower extremity arterial Doppler studies.  Dyslipidemia, goal LDL below 70 History of dyslipidemia on statin therapy with lipid profile performed 05/19/2017 revealing a total cholesterol of 138 with an LDL of 73.  Accelerated hypertension with diastolic congestive heart failure, NYHA class 3 (HCC) History of chronic diastolic heart failure with normal EF by 2D echo 09/08/2018 and grade 1 diastolic dysfunction.  She does have chronic lower extremity edema left greater than right on low-dose diuretics.      Lorretta Harp MD FACP,FACC,FAHA, [facility] 03/04/2020 4:16 PM

## 2020-03-04 NOTE — Assessment & Plan Note (Signed)
History of CAD status post coronary artery bypass grafting in 1998.  She has not had a cardiac catheterization since that time.  She did have a Myoview stress test performed for preoperative clearance 09/15/2018 which was nonischemic and low risk.  She does get occasional chest pain which is nitrate responsive every other week.  She wants to have a left total hip replacement done electively in the next several months.  We will repeat a Lexiscan Myoview stress test to risk stratify her.

## 2020-03-04 NOTE — Assessment & Plan Note (Signed)
History of chronic diastolic heart failure with normal EF by 2D echo 09/08/2018 and grade 1 diastolic dysfunction.  She does have chronic lower extremity edema left greater than right on low-dose diuretics.

## 2020-03-06 ENCOUNTER — Other Ambulatory Visit: Payer: Self-pay | Admitting: Internal Medicine

## 2020-03-06 DIAGNOSIS — G8929 Other chronic pain: Secondary | ICD-10-CM | POA: Diagnosis not present

## 2020-03-06 DIAGNOSIS — R102 Pelvic and perineal pain: Secondary | ICD-10-CM

## 2020-03-06 DIAGNOSIS — S3993XA Unspecified injury of pelvis, initial encounter: Secondary | ICD-10-CM | POA: Diagnosis not present

## 2020-03-06 DIAGNOSIS — R300 Dysuria: Secondary | ICD-10-CM | POA: Diagnosis not present

## 2020-03-06 DIAGNOSIS — S32591G Other specified fracture of right pubis, subsequent encounter for fracture with delayed healing: Secondary | ICD-10-CM | POA: Diagnosis not present

## 2020-03-07 ENCOUNTER — Telehealth (HOSPITAL_COMMUNITY): Payer: Self-pay

## 2020-03-07 NOTE — Telephone Encounter (Signed)
Encounter complete. 

## 2020-03-10 ENCOUNTER — Ambulatory Visit
Admission: RE | Admit: 2020-03-10 | Discharge: 2020-03-10 | Disposition: A | Discharge status: Home / Self Care | Payer: Medicare Other | Source: Ambulatory Visit | Attending: Internal Medicine | Admitting: Internal Medicine

## 2020-03-10 DIAGNOSIS — R102 Pelvic and perineal pain: Secondary | ICD-10-CM | POA: Diagnosis not present

## 2020-03-13 ENCOUNTER — Inpatient Hospital Stay (HOSPITAL_COMMUNITY): Admission: RE | Admit: 2020-03-13 | Payer: Medicare Other | Source: Ambulatory Visit

## 2020-03-18 ENCOUNTER — Emergency Department (HOSPITAL_COMMUNITY): Payer: Medicare Other

## 2020-03-18 ENCOUNTER — Ambulatory Visit: Payer: Medicare Other | Admitting: Orthopaedic Surgery

## 2020-03-18 ENCOUNTER — Observation Stay (HOSPITAL_COMMUNITY): Payer: Medicare Other

## 2020-03-18 ENCOUNTER — Other Ambulatory Visit: Payer: Self-pay

## 2020-03-18 ENCOUNTER — Observation Stay (HOSPITAL_COMMUNITY)
Admission: EM | Admit: 2020-03-18 | Discharge: 2020-03-19 | Disposition: A | Discharge status: Home / Self Care | Payer: Medicare Other | Attending: Internal Medicine | Admitting: Internal Medicine

## 2020-03-18 ENCOUNTER — Encounter (HOSPITAL_COMMUNITY): Payer: Self-pay | Admitting: Internal Medicine

## 2020-03-18 DIAGNOSIS — R079 Chest pain, unspecified: Secondary | ICD-10-CM

## 2020-03-18 DIAGNOSIS — Z7902 Long term (current) use of antithrombotics/antiplatelets: Secondary | ICD-10-CM | POA: Diagnosis not present

## 2020-03-18 DIAGNOSIS — E119 Type 2 diabetes mellitus without complications: Secondary | ICD-10-CM | POA: Diagnosis not present

## 2020-03-18 DIAGNOSIS — M1612 Unilateral primary osteoarthritis, left hip: Secondary | ICD-10-CM | POA: Diagnosis present

## 2020-03-18 DIAGNOSIS — I361 Nonrheumatic tricuspid (valve) insufficiency: Secondary | ICD-10-CM | POA: Diagnosis not present

## 2020-03-18 DIAGNOSIS — I5032 Chronic diastolic (congestive) heart failure: Secondary | ICD-10-CM | POA: Diagnosis not present

## 2020-03-18 DIAGNOSIS — I5030 Unspecified diastolic (congestive) heart failure: Secondary | ICD-10-CM

## 2020-03-18 DIAGNOSIS — R0789 Other chest pain: Principal | ICD-10-CM | POA: Insufficient documentation

## 2020-03-18 DIAGNOSIS — E785 Hyperlipidemia, unspecified: Secondary | ICD-10-CM | POA: Diagnosis not present

## 2020-03-18 DIAGNOSIS — Z79899 Other long term (current) drug therapy: Secondary | ICD-10-CM | POA: Insufficient documentation

## 2020-03-18 DIAGNOSIS — I200 Unstable angina: Secondary | ICD-10-CM | POA: Diagnosis not present

## 2020-03-18 DIAGNOSIS — E782 Mixed hyperlipidemia: Secondary | ICD-10-CM | POA: Insufficient documentation

## 2020-03-18 DIAGNOSIS — Z9861 Coronary angioplasty status: Secondary | ICD-10-CM | POA: Diagnosis not present

## 2020-03-18 DIAGNOSIS — Z7984 Long term (current) use of oral hypoglycemic drugs: Secondary | ICD-10-CM | POA: Insufficient documentation

## 2020-03-18 DIAGNOSIS — I2510 Atherosclerotic heart disease of native coronary artery without angina pectoris: Secondary | ICD-10-CM | POA: Insufficient documentation

## 2020-03-18 DIAGNOSIS — E1169 Type 2 diabetes mellitus with other specified complication: Secondary | ICD-10-CM | POA: Diagnosis present

## 2020-03-18 DIAGNOSIS — I160 Hypertensive urgency: Secondary | ICD-10-CM

## 2020-03-18 DIAGNOSIS — R072 Precordial pain: Secondary | ICD-10-CM | POA: Diagnosis not present

## 2020-03-18 DIAGNOSIS — Z20822 Contact with and (suspected) exposure to covid-19: Secondary | ICD-10-CM | POA: Insufficient documentation

## 2020-03-18 DIAGNOSIS — I10 Essential (primary) hypertension: Secondary | ICD-10-CM | POA: Diagnosis present

## 2020-03-18 DIAGNOSIS — Z951 Presence of aortocoronary bypass graft: Secondary | ICD-10-CM | POA: Diagnosis not present

## 2020-03-18 DIAGNOSIS — I110 Hypertensive heart disease with heart failure: Secondary | ICD-10-CM | POA: Insufficient documentation

## 2020-03-18 DIAGNOSIS — I252 Old myocardial infarction: Secondary | ICD-10-CM | POA: Insufficient documentation

## 2020-03-18 HISTORY — PX: TRANSTHORACIC ECHOCARDIOGRAM: SHX275

## 2020-03-18 HISTORY — DX: Adverse effect of other drugs, medicaments and biological substances, initial encounter: T50.995A

## 2020-03-18 HISTORY — DX: Peptic ulcer, site unspecified, unspecified as acute or chronic, without hemorrhage or perforation: K27.9

## 2020-03-18 HISTORY — DX: Aneurysm of heart: I25.3

## 2020-03-18 LAB — URINALYSIS, ROUTINE W REFLEX MICROSCOPIC
Bilirubin Urine: NEGATIVE
Glucose, UA: NEGATIVE mg/dL
Hgb urine dipstick: NEGATIVE
Ketones, ur: NEGATIVE mg/dL
Nitrite: NEGATIVE
Protein, ur: NEGATIVE mg/dL
Specific Gravity, Urine: 1.01 (ref 1.005–1.030)
pH: 7 (ref 5.0–8.0)

## 2020-03-18 LAB — CBC
HCT: 46.2 % — ABNORMAL HIGH (ref 36.0–46.0)
Hemoglobin: 14 g/dL (ref 12.0–15.0)
MCH: 27.7 pg (ref 26.0–34.0)
MCHC: 30.3 g/dL (ref 30.0–36.0)
MCV: 91.5 fL (ref 80.0–100.0)
Platelets: 214 10*3/uL (ref 150–400)
RBC: 5.05 MIL/uL (ref 3.87–5.11)
RDW: 14 % (ref 11.5–15.5)
WBC: 5.4 10*3/uL (ref 4.0–10.5)
nRBC: 0 % (ref 0.0–0.2)

## 2020-03-18 LAB — TROPONIN I (HIGH SENSITIVITY)
Troponin I (High Sensitivity): 11 ng/L (ref <18)
Troponin I (High Sensitivity): 11 ng/L (ref <18)

## 2020-03-18 LAB — BASIC METABOLIC PANEL
Anion gap: 13 (ref 5–15)
BUN: 11 mg/dL (ref 8–23)
CO2: 28 mmol/L (ref 22–32)
Calcium: 10 mg/dL (ref 8.9–10.3)
Chloride: 99 mmol/L (ref 98–111)
Creatinine, Ser: 0.93 mg/dL (ref 0.44–1.00)
GFR calc Af Amer: >60 mL/min (ref >60)
GFR calc non Af Amer: 56 mL/min — ABNORMAL LOW (ref >60)
Glucose, Bld: 175 mg/dL — ABNORMAL HIGH (ref 70–99)
Potassium: 4 mmol/L (ref 3.5–5.1)
Sodium: 140 mmol/L (ref 135–145)

## 2020-03-18 LAB — TSH: TSH: 0.79 u[IU]/mL (ref 0.350–4.500)

## 2020-03-18 LAB — SARS CORONAVIRUS 2 (TAT 6-24 HRS): SARS Coronavirus 2: NEGATIVE

## 2020-03-18 LAB — D-DIMER, QUANTITATIVE: D-Dimer, Quant: 19.65 ug/mL-FEU — ABNORMAL HIGH (ref 0.00–0.50)

## 2020-03-18 LAB — ECHOCARDIOGRAM COMPLETE

## 2020-03-18 LAB — GLUCOSE, CAPILLARY: Glucose-Capillary: 280 mg/dL — ABNORMAL HIGH (ref 70–99)

## 2020-03-18 MED ORDER — ONDANSETRON HCL 4 MG/2ML IJ SOLN: 4.0000 mg | Freq: Four times a day (QID) | INTRAMUSCULAR | Status: DC | PRN

## 2020-03-18 MED ORDER — HYDROCODONE-ACETAMINOPHEN 5-325 MG PO TABS
1.0000 | ORAL_TABLET | Freq: Four times a day (QID) | ORAL | Status: DC | PRN
  Administered 2020-03-18: 1 via ORAL
  Filled 2020-03-18: qty 1

## 2020-03-18 MED ORDER — SODIUM CHLORIDE 0.9% FLUSH: 3.0000 mL | Freq: Once | INTRAVENOUS | Status: DC

## 2020-03-18 MED ORDER — HEPARIN (PORCINE) 25000 UT/250ML-% IV SOLN
650.0000 [IU]/h | INTRAVENOUS | Status: DC
  Administered 2020-03-18: 750 [IU]/h via INTRAVENOUS
  Filled 2020-03-18: qty 250

## 2020-03-18 MED ORDER — DIPHENHYDRAMINE HCL 50 MG/ML IJ SOLN: 50.0000 mg | Freq: Once | INTRAMUSCULAR | Status: DC

## 2020-03-18 MED ORDER — TRAMADOL HCL 50 MG PO TABS
50.0000 mg | ORAL_TABLET | Freq: Three times a day (TID) | ORAL | Status: DC | PRN
  Administered 2020-03-19: 50 mg via ORAL
  Filled 2020-03-18: qty 1

## 2020-03-18 MED ORDER — CLOPIDOGREL BISULFATE 75 MG PO TABS
75.0000 mg | ORAL_TABLET | Freq: Every day | ORAL | Status: DC
  Administered 2020-03-19: 75 mg via ORAL
  Filled 2020-03-18 (×2): qty 1

## 2020-03-18 MED ORDER — ALUM & MAG HYDROXIDE-SIMETH 200-200-20 MG/5ML PO SUSP
30.0000 mL | Freq: Once | ORAL | Status: AC
  Administered 2020-03-18: 12:00:00 30 mL via ORAL
  Filled 2020-03-18: qty 30

## 2020-03-18 MED ORDER — ASPIRIN 81 MG PO CHEW
324.0000 mg | CHEWABLE_TABLET | Freq: Once | ORAL | Status: AC
  Administered 2020-03-18: 324 mg via ORAL
  Filled 2020-03-18: qty 4

## 2020-03-18 MED ORDER — ACETAMINOPHEN 325 MG PO TABS: 650.0000 mg | ORAL_TABLET | ORAL | Status: DC | PRN

## 2020-03-18 MED ORDER — SODIUM CHLORIDE 0.9% FLUSH
3.0000 mL | Freq: Two times a day (BID) | INTRAVENOUS | Status: DC
  Administered 2020-03-18: 3 mL via INTRAVENOUS

## 2020-03-18 MED ORDER — SODIUM CHLORIDE 0.9% FLUSH: 3.0000 mL | INTRAVENOUS | Status: DC | PRN

## 2020-03-18 MED ORDER — HYDRALAZINE HCL 20 MG/ML IJ SOLN
20.0000 mg | Freq: Once | INTRAMUSCULAR | Status: AC
  Administered 2020-03-18: 20 mg via INTRAVENOUS
  Filled 2020-03-18: qty 1

## 2020-03-18 MED ORDER — LORAZEPAM 0.5 MG PO TABS
0.5000 mg | ORAL_TABLET | Freq: Four times a day (QID) | ORAL | Status: DC | PRN
  Administered 2020-03-18: 0.5 mg via ORAL
  Filled 2020-03-18: qty 1

## 2020-03-18 MED ORDER — INSULIN ASPART 100 UNIT/ML ~~LOC~~ SOLN
4.0000 [IU] | Freq: Once | SUBCUTANEOUS | Status: AC
  Administered 2020-03-18: 4 [IU] via SUBCUTANEOUS

## 2020-03-18 MED ORDER — ASPIRIN EC 81 MG PO TBEC
81.0000 mg | DELAYED_RELEASE_TABLET | Freq: Every day | ORAL | Status: DC
  Administered 2020-03-19: 81 mg via ORAL
  Filled 2020-03-18: qty 1

## 2020-03-18 MED ORDER — NOREPINEPHRINE 4 MG/250ML-% IV SOLN: 2.0000 ug/min | INTRAVENOUS | Status: DC

## 2020-03-18 MED ORDER — METHOCARBAMOL 500 MG PO TABS
500.0000 mg | ORAL_TABLET | Freq: Four times a day (QID) | ORAL | Status: DC | PRN
  Administered 2020-03-19: 500 mg via ORAL
  Filled 2020-03-18: qty 1

## 2020-03-18 MED ORDER — SODIUM CHLORIDE 0.9 % IV SOLN: 250.0000 mL | INTRAVENOUS | Status: DC

## 2020-03-18 MED ORDER — MORPHINE SULFATE (PF) 2 MG/ML IV SOLN
2.0000 mg | INTRAVENOUS | Status: DC | PRN
  Administered 2020-03-18: 2 mg via INTRAVENOUS
  Filled 2020-03-18 (×2): qty 1

## 2020-03-18 MED ORDER — HYDROCORTISONE NA SUCCINATE PF 250 MG IJ SOLR
200.0000 mg | Freq: Once | INTRAMUSCULAR | Status: AC
  Administered 2020-03-18: 200 mg via INTRAVENOUS
  Filled 2020-03-18: qty 200

## 2020-03-18 MED ORDER — ISOSORBIDE MONONITRATE ER 30 MG PO TB24
60.0000 mg | ORAL_TABLET | Freq: Every day | ORAL | Status: DC
  Administered 2020-03-18: 60 mg via ORAL
  Filled 2020-03-18: qty 2

## 2020-03-18 MED ORDER — ROSUVASTATIN CALCIUM 20 MG PO TABS
20.0000 mg | ORAL_TABLET | Freq: Every day | ORAL | Status: DC
  Administered 2020-03-18: 20 mg via ORAL
  Filled 2020-03-18: qty 1

## 2020-03-18 MED ORDER — LIDOCAINE VISCOUS HCL 2 % MT SOLN
15.0000 mL | Freq: Once | OROMUCOSAL | Status: AC
  Administered 2020-03-18: 12:00:00 15 mL via ORAL
  Filled 2020-03-18: qty 15

## 2020-03-18 MED ORDER — CARVEDILOL 3.125 MG PO TABS
3.1250 mg | ORAL_TABLET | Freq: Two times a day (BID) | ORAL | Status: DC
  Administered 2020-03-18 – 2020-03-19 (×2): 3.125 mg via ORAL
  Filled 2020-03-18 (×3): qty 1

## 2020-03-18 MED ORDER — POLYVINYL ALCOHOL 1.4 % OP SOLN
1.0000 [drp] | Freq: Every day | OPHTHALMIC | Status: DC
  Filled 2020-03-18 (×2): qty 15

## 2020-03-18 MED ORDER — FENTANYL CITRATE (PF) 100 MCG/2ML IJ SOLN
INTRAMUSCULAR | Status: AC
  Administered 2020-03-18: 25 ug
  Filled 2020-03-18: qty 2

## 2020-03-18 MED ORDER — GABAPENTIN 300 MG PO CAPS
300.0000 mg | ORAL_CAPSULE | Freq: Two times a day (BID) | ORAL | Status: DC
  Administered 2020-03-18 – 2020-03-19 (×2): 300 mg via ORAL
  Filled 2020-03-18 (×2): qty 1

## 2020-03-18 MED ORDER — FOLIC ACID 1 MG PO TABS
1.0000 mg | ORAL_TABLET | Freq: Every day | ORAL | Status: DC
  Administered 2020-03-19: 1 mg via ORAL
  Filled 2020-03-18: qty 1

## 2020-03-18 MED ORDER — HEPARIN BOLUS VIA INFUSION
3800.0000 [IU] | Freq: Once | INTRAVENOUS | Status: AC
  Administered 2020-03-18: 3800 [IU] via INTRAVENOUS
  Filled 2020-03-18: qty 3800

## 2020-03-18 MED ORDER — GADOBUTROL 1 MMOL/ML IV SOLN
6.0000 mL | Freq: Once | INTRAVENOUS | Status: AC | PRN
  Administered 2020-03-18: 6 mL via INTRAVENOUS

## 2020-03-18 MED ORDER — NITROGLYCERIN 0.4 MG SL SUBL
0.4000 mg | SUBLINGUAL_TABLET | SUBLINGUAL | Status: DC | PRN
  Administered 2020-03-18: 0.4 mg via SUBLINGUAL
  Filled 2020-03-18: qty 1

## 2020-03-18 MED ORDER — PANTOPRAZOLE SODIUM 40 MG PO TBEC
40.0000 mg | DELAYED_RELEASE_TABLET | Freq: Every day | ORAL | Status: DC
  Administered 2020-03-19: 40 mg via ORAL
  Filled 2020-03-18: qty 1

## 2020-03-18 MED ORDER — CARVEDILOL 3.125 MG PO TABS
3.1250 mg | ORAL_TABLET | Freq: Once | ORAL | Status: AC
  Administered 2020-03-18: 15:00:00 3.125 mg via ORAL
  Filled 2020-03-18: qty 1

## 2020-03-18 MED ORDER — ISOSORBIDE MONONITRATE ER 60 MG PO TB24: 90.0000 mg | ORAL_TABLET | Freq: Every day | ORAL | Status: DC

## 2020-03-18 MED ORDER — SODIUM CHLORIDE 0.9 % IV SOLN: 250.0000 mL | INTRAVENOUS | Status: DC | PRN

## 2020-03-18 MED ORDER — PREDNISONE 5 MG PO TABS
5.0000 mg | ORAL_TABLET | Freq: Every day | ORAL | Status: DC
  Administered 2020-03-19: 5 mg via ORAL
  Filled 2020-03-18 (×2): qty 1

## 2020-03-18 MED ORDER — SULFASALAZINE 500 MG PO TBEC
1000.0000 mg | DELAYED_RELEASE_TABLET | Freq: Every day | ORAL | Status: DC
  Administered 2020-03-19: 1000 mg via ORAL
  Filled 2020-03-18: qty 2

## 2020-03-18 MED ORDER — ENOXAPARIN SODIUM 40 MG/0.4ML ~~LOC~~ SOLN: 40.0000 mg | SUBCUTANEOUS | Status: DC

## 2020-03-18 MED ORDER — SODIUM CHLORIDE 0.9 % IV BOLUS
250.0000 mL | Freq: Once | INTRAVENOUS | Status: AC
  Administered 2020-03-18: 250 mL via INTRAVENOUS

## 2020-03-18 MED ORDER — DIPHENHYDRAMINE HCL 25 MG PO CAPS: 50.0000 mg | ORAL_CAPSULE | Freq: Once | ORAL | Status: DC

## 2020-03-18 NOTE — Plan of Care (Signed)
Pt educated and oriented to unit/room. Also educated on heparin gtt. Pt VU. No needs.

## 2020-03-18 NOTE — ED Notes (Signed)
Pt transported to MRI 

## 2020-03-18 NOTE — ED Notes (Signed)
Pt c/o 10/10 cp, RN gave 1 SL nitro at [id number], pt's BP dropped to [id number] systolic, RN did not give another dose. Pt's BP continued to drop, EDP made aware and admitting paged. Verbal orders given for NS bolus, 80mcg fentanyl. Pt AOX4, c/o dizziness. BP now 100/48

## 2020-03-18 NOTE — ED Provider Notes (Signed)
[facility] EMERGENCY DEPARTMENT Provider Note   CSN: [id number] Arrival date & time: 03/18/20  [id number]     History Chief Complaint  Patient presents with  . Chest Pain    [name] is a 86 y.o. female.  86 year old female with past medical history of coronary artery disease with CABG x3 as well as prior stents, CHF, diabetes, dyslipidemia, peripheral artery disease, on Plavix does not take aspirin.  Patient states that she developed midsternal chest pain at 7 AM today, radiates across her shoulders, is constant, no relief with taking 2 nitro at home.  Patient states that she is belching a lot today, denies nausea, vomiting, diaphoresis, shortness of breath.  Patient reports urinary frequency after taking a fluid pill yesterday for lower extremity edema.     HPI: A 86 year old patient with a history of CVA, peripheral artery disease, treated diabetes, hypertension and hypercholesterolemia presents for evaluation of chest pain. Initial onset of pain was approximately 3-6 hours ago. The patient's chest pain is not worse with exertion. The patient's chest pain is middle- or left-sided, is not well-localized, is not described as heaviness/pressure/tightness, is not sharp and does not radiate to the arms/jaw/neck. The patient does not complain of nausea and denies diaphoresis. The patient has not smoked in the past 90 days, has no relevant family history of coronary artery disease (first degree relative at less than age 19) and does not have an elevated BMI (>=30).   Past Medical History:  Diagnosis Date  . Atherosclerotic heart disease native coronary artery w/angina pectoris ([place]) 1992   a. 1992 s/p PTCA of LAD;  b. 1998 CABG x 3; c. 07/2001 Cath: Sev LM/LAD/LCX dzs, 3/3 patent grafts; d. 11/2013 Cath: stable graft anatomy; e. 10/2016 Cath: native 3VD, VG->OM nl, LIMA->LAD nl, VG->Diag 55.  . Bradycardia, mild briefly to the 40s, asymptomatic 05/16/2013  . Carotid arterial  disease ([place])    a. 05/2014 Carotid U/S: No signif bilat ICA stenosis, >60% L ECA.  Marland Kitchen Chronic anemia   . Chronic diastolic CHF (congestive heart failure) ([place])    a. 10/2016 Echo: Ef 55-60%, no rwma, Gr1 DD, triv AI, PASP 30mmHg, atrial septal aneurysm.  . DM (diabetes mellitus), type 2 with peripheral vascular complications (HCC)    On Oral medications.  . Essential hypertension   . GERD (gastroesophageal reflux disease)   . Hyperlipidemia with target LDL less than 70   . PAD (peripheral artery disease) ([place]) 05/2013; 09/2013   a. severe calcified POP-1 & TP Trunk Dz bilat;  b . 05/2013 Diamondback Rot Athrectomy (R POP) --> PTA R Pop, DES to R Peroneal;  c. 09/2013 PTA/Stenting of L Pop [name] - retrograde access);  d. 04/2014 ABI's: R/L: 1.1/1.1.  Marland Kitchen Peptic ulcer disease   . S/P CABG x 3 1998   LIMA-LAD, SVG-OM, SVG-D1  . Severe claudication ([place]) 05/2013   Referred for Peripheral Angio (Dr. Gwenlyn Found --> Dr. Brunetta Jeans in [place])    Patient Active Problem List   Diagnosis Date Noted  . Unilateral primary osteoarthritis, left hip 04/03/2019  . History of right hip replacement 04/03/2019  . Status post total replacement of right hip 10/12/2018  . Unilateral primary osteoarthritis, right hip 09/12/2018  . Degenerative joint disease 05/30/2018  . Anal fissure 11/10/2017  . Rectal bleeding 11/10/2017  . Rectal pain 11/10/2017  . Inflammatory arthritis 10/13/2017  . Latent tuberculosis by blood test 10/13/2017  . Chronic diastolic CHF (congestive heart failure), NYHA class 2 (  [place]) 08/11/2017  . Fatigue due to treatment 01/09/2017  . NSTEMI (non-ST elevated myocardial infarction) ([place]) 11/01/2016  . Diabetes mellitus ([place]) 10/24/2015  . Non-insulin dependent type 2 diabetes mellitus ([place]) 10/24/2015  . Angina, class I ([place]) 04/22/2015  . DOE (dyspnea on exertion) 04/22/2015  . Bilateral leg edema 04/22/2015  . Accumulation of fluid in tissues 03/21/2014  . Vertigo, central 01/02/2014    . Vertigo 01/02/2014  . Limb pain- echymosis, pain Lt radial artery after cath 12/11/2013  . Dyslipidemia, goal LDL below 70 05/17/2013  . Accelerated hypertension with diastolic congestive heart failure, NYHA class 3 ([place]) 05/17/2013  . Claudication ([place]) 05/16/2013  . PTA/ Stent Rt popliteal and Rt peroneal artery 05/16/13 05/16/2013  . PAD,severe calcified pop and tibial peroneal trunk disease bilateraly   . ABDOMINAL PAIN RIGHT LOWER QUADRANT 10/07/2010  . CVA-STROKE 03/04/2010  . BARRETT'S ESOPHAGUS 03/04/2010  . FECAL INCONTINENCE 03/04/2010  . DM 01/14/2010  . Anemia of chronic disease 01/14/2010  . Atherosclerotic heart disease of native coronary artery with angina pectoris ([place]) 01/14/2010  . DIVERTICULOSIS OF COLON 01/14/2010  . PERSONAL HISTORY OF COLONIC POLYPS 01/14/2010  . [place] ALLERGY 01/14/2010    Past Surgical History:  Procedure Laterality Date  . ATHERECTOMY Right 05/15/2013   Procedure: ATHERECTOMY;  Surgeon: Lorretta Harp, MD;  Location[facility] CATH LAB;  Service: Cardiovascular;  Laterality: Right;  popliteal  . BACK SURGERY    . CARDIAC CATHETERIZATION  07/17/01   2 v CAD with LM, circ, LAD, obtuse diag, mod stenosis of diag vein graft , mild stenosis of marg vein graft, nl EF, medical treatment  . CARDIAC CATHETERIZATION  11/2013   Widely patent LIMA-LAD & SVG-OM with mild progression of proximal stenosis ~40-50% in SVG-D1; Widely patent native RCA with known severe native LCA disease  . CARDIAC CATHETERIZATION N/A 11/01/2016   Procedure: Left Heart Cath and Cors/Grafts Angiography;  Surgeon: [name], MD;  Location: [facility] CV LAB;  Service: Cardiovascular: Hemodynamics: High LVEDP!.  Cors/Grafts: LM 55%, o-p LAD 100% then after D1 -> LIMA-LAD patent, SVG-Diag ~55%. small RI wiht ~70% ostial. O-p Cx 80% - pCx 70% --> SVG-bifurcating OM patent. -- med Rx  . CORONARY ANGIOPLASTY  1992   PCI to LAD  . CORONARY ARTERY BYPASS GRAFT  1998   LIMA-LAD,  SVG-diagonal (none roughly 50% stenosis), SVG-OM  . LEFT HEART CATHETERIZATION WITH CORONARY ANGIOGRAM N/A 11/27/2013   Procedure: LEFT HEART CATHETERIZATION WITH CORONARY ANGIOGRAM;  Surgeon: [name], MD;  Location: [facility] CATH LAB;  Service: Cardiovascular;  Laterality: N/A;  . LOW EXTREMITY DOPPLERS/ABI  10/2016   Patent right SFA, popliteal and peroneal tendons. Patent left SFA and popliteal stent. 30-50% stenosis in the right common femoral and popliteal arteries, 50-74% stenosis in left profunda femoris artery. --> 1 year follow-up.  RABI - 1.2, LABI 1.1  . LOWER EXTREMITY ANGIOGRAM N/A 02/22/2013   Procedure: LOWER EXTREMITY ANGIOGRAM;  Surgeon: Lorretta Harp, MD;  Location: [facility] CATH LAB;  Service: Cardiovascular;  Laterality: N/A;  . LOWER EXTREMITY ANGIOGRAM N/A 07/20/2013   Procedure: LOWER EXTREMITY ANGIOGRAM;  Surgeon: Lorretta Harp, MD;  Location: [facility] CATH LAB;  Service: Cardiovascular;  Laterality: N/A;  . NM MYOVIEW LTD  04/03/2013; 5/26'/2016   Lexiscan: a)  Apical perfusion defect with no ischemia. Consider prior infarct versus breast attenuation.;; b) 5/'16: LOW RISK, Nl EF ~55-65%, No Ischemia /Infarction  . NM MYOVIEW LTD  09/2018   LOW RISK.  EF  55%.  No ischemia or infarction.  Marland Kitchen PERCUTANEOUS STENT INTERVENTION Right 05/15/2013   Procedure: PERCUTANEOUS STENT INTERVENTION;  Surgeon: Lorretta Harp, MD;  Location: [facility] CATH LAB;  Service: Cardiovascular;  Laterality: Right;  tibioperoneal trunk  . Peroneal Artery Stent  05/15/13   Diamondback rot. atherectomy of high grade segmental popliteal stenosis then [place] balloonPTA; then angiosculpt PTA of the prox peroneal with stent-Xpedition   . pv angio  07/20/13   high-grade calcified popliteal disease with one-vessel runoff via a small anterior tibial artery that has proximal disease as well, no intervention  . TOTAL HIP ARTHROPLASTY Right 10/12/2018   Procedure: RIGHT TOTAL HIP ARTHROPLASTY ANTERIOR APPROACH;  Surgeon:  Mcarthur Rossetti, MD;  Location: [facility] ORS;  Service: Orthopedics;  Laterality: Right;  . TRANSTHORACIC ECHOCARDIOGRAM  10/2016   EF 55-60%. Gr 1 DD. Normal PAP. Aortic Sclerosis.  . TRANSTHORACIC ECHOCARDIOGRAM  09/2018   Normal LV size and function with vigorous EF 65 to 70%.  GR 1 DD.  Severe LA dilation (suggestive of probably worsening: GR 1 DD).  . TUBAL LIGATION       OB History   No obstetric history on file.     Family History  Problem Relation Age of Onset  . Hypertension Mother   . Hyperlipidemia Mother   . Hyperlipidemia Father   . Hypertension Father     Social History   Tobacco Use  . Smoking status: Never Smoker  . Smokeless tobacco: Never Used  Substance Use Topics  . Alcohol use: No    Comment: rare glass of wine   . Drug use: No    Home Medications Prior to Admission medications   Medication Sig Start Date End Date Taking? Authorizing Provider  acetaminophen (TYLENOL) 500 MG tablet Take 1 tablet (500 mg total) by mouth daily as needed for moderate pain. LEG PAIN. PLEASE DISCUSS STARTING A MEDICATION FOR PAIN MANAGEMENT WITH YOUR PCP. 06/26/19   Lorretta Harp, MD  carvedilol (COREG) 3.125 MG tablet TAKE 1 TABLET (3.125 MG TOTAL) BY MOUTH 2 (TWO) TIMES DAILY. 06/12/19 03/04/20  [name], MD  clopidogrel (PLAVIX) 75 MG tablet Take 75 mg by mouth daily.    [provider]  Coenzyme Q10 (CO Q 10 PO) Take 1 tablet by mouth every other day.     [provider]  ferrous sulfate 325 (65 FE) MG tablet Take 1 tablet (325 mg total) by mouth daily with breakfast. Patient taking differently: Take 27 mg by mouth daily with breakfast.  02/05/19   [name], MD  folic acid (FOLVITE) 1 MG tablet Take 1 mg by mouth daily. 08/21/18   [provider]  furosemide (LASIX) 40 MG tablet Take 40 Mg (1 tablet) daily 03/04/20   Lorretta Harp, MD  gabapentin (NEURONTIN) 300 MG capsule TAKE 1 CAPSULE BY MOUTH THREE TIMES A DAY Patient  taking differently: 2 (two) times daily.  01/01/19   Wallene Huh, DPM  glimepiride (AMARYL) 1 MG tablet Take 2 mg by mouth daily with breakfast.  05/02/18   [provider]  HYDROcodone-acetaminophen (NORCO/VICODIN) 5-325 MG tablet Take 1 tablet by mouth every 6 (six) hours as needed for moderate pain. 07/03/19   Mcarthur Rossetti, MD  isosorbide mononitrate (IMDUR) 60 MG 24 hr tablet TAKE 1.5 TABLETS (90 MG TOTAL) BY MOUTH DAILY. . 12/21/19   Lorretta Harp, MD  LORazepam (ATIVAN) 0.5 MG tablet Take 0.5 mg by mouth every 6 (six) hours as  needed for anxiety or sleep.  03/17/18   [provider]  methocarbamol (ROBAXIN) 500 MG tablet Take 1 tablet (500 mg total) by mouth every 6 (six) hours as needed for muscle spasms. 11/01/18   Pete Pelt, PA-C  methotrexate (RHEUMATREX) 2.5 MG tablet Take 20 mg by mouth once a week. Every Monday of each week 08/21/18   [provider]  nitroGLYCERIN (NITROSTAT) 0.4 MG SL tablet PLACE 1 TABLET (0.4 MG TOTAL) UNDER THE TONGUE EVERY 5 (FIVE) MINUTES AS NEEDED FOR CHEST PAIN. 02/13/18   Theora Gianotti, NP  pantoprazole (PROTONIX) 40 MG tablet Take 1 tablet (40 mg total) by mouth 2 (two) times daily before a meal. Patient taking differently: Take 40 mg by mouth daily.  08/13/17   Daune Perch, NP  Polyethyl Glycol-Propyl Glycol (SYSTANE ULTRA) 0.4-0.3 % SOLN Place 1 drop into both eyes at bedtime.    [provider]  predniSONE (DELTASONE) 5 MG tablet Take 5 mg by mouth daily with breakfast.     [provider]  rosuvastatin (CRESTOR) 20 MG tablet Take 20 mg by mouth at bedtime.     [provider]  sulfaSALAzine (AZULFIDINE) 500 MG EC tablet Take 1,000 mg by mouth daily.  05/29/18   [provider]  traMADol (ULTRAM) 50 MG tablet Take 1 tablet (50 mg total) by mouth every 8 (eight) hours as needed for severe pain. 02/14/20   Pete Pelt, PA-C    Allergies    Fish allergy, Ivp dye  [iodinated diagnostic agents], Latex, Shellfish allergy, Shellfish-derived products, Other, and Metformin  Review of Systems   Review of Systems  Constitutional: Negative for fever.  Respiratory: Negative for shortness of breath.   Cardiovascular: Positive for chest pain.  Gastrointestinal: Negative for abdominal pain, constipation, diarrhea, nausea and vomiting.  Genitourinary: Positive for frequency. Negative for difficulty urinating.  Skin: Negative for rash and wound.  Allergic/Immunologic: Positive for immunocompromised state.  Neurological: Negative for weakness.  All other systems reviewed and are negative.   Physical Exam Updated Vital Signs BP (!) 195/89 (BP Location: Left Arm)   Pulse 66   Temp 98.5 F (36.9 C) (Oral)   Resp 16   SpO2 100%   Physical Exam Vitals and nursing note reviewed.  Constitutional:      General: She is not in acute distress.    Appearance: She is well-developed. She is not diaphoretic.  HENT:     Head: Normocephalic and atraumatic.  Cardiovascular:     Rate and Rhythm: Normal rate and regular rhythm.     Heart sounds: Normal heart sounds.  Pulmonary:     Effort: Pulmonary effort is normal.     Breath sounds: Normal breath sounds.  Chest:     Chest wall: No tenderness.  Abdominal:     Palpations: Abdomen is soft.     Tenderness: There is no abdominal tenderness.  Musculoskeletal:       Back:     Right lower leg: Edema present.     Left lower leg: Edema present.     Comments: Minimal pitting edema to bilateral lower extremities.  Skin:    General: Skin is warm and dry.  Neurological:     Mental Status: She is alert and oriented to person, place, and time.  Psychiatric:        Behavior: Behavior normal.     ED Results / Procedures / Treatments   Labs (all labs ordered are listed, but only abnormal results  are displayed) Labs Reviewed  BASIC METABOLIC PANEL - Abnormal; Notable for the following components:      Result Value     Glucose, Bld 175 (*)    GFR calc non Af Amer 56 (*)    All other components within normal limits  CBC - Abnormal; Notable for the following components:   HCT 46.2 (*)    All other components within normal limits  URINALYSIS, ROUTINE W REFLEX MICROSCOPIC - Abnormal; Notable for the following components:   Leukocytes,Ua TRACE (*)    Bacteria, UA RARE (*)    All other components within normal limits  SARS CORONAVIRUS 2 (TAT 6-24 HRS)  URINE CULTURE  TROPONIN I (HIGH SENSITIVITY)  TROPONIN I (HIGH SENSITIVITY)    EKG EKG Interpretation  Date/Time:  Tuesday March 18 2020 09:42:00 EDT Ventricular Rate:  86 PR Interval:  180 QRS Duration: 76 QT Interval:  392 QTC Calculation: 469 R Axis:   58 Text Interpretation: Normal sinus rhythm Nonspecific ST abnormality Abnormal ECG since last tracing no significant change Confirmed by Noemi Chapel [phone]) on 03/18/2020 11:30:21 AM   Radiology DG Chest 2 View  Result Date: 03/18/2020 CLINICAL DATA:  Chest pain EXAM: CHEST - 2 VIEW COMPARISON:  11/17/2019 FINDINGS: Hypoventilation with decreased lung volume. Right lower lobe atelectasis and elevated right hemidiaphragm unchanged. Postop CABG.  Negative for heart failure or effusion. IMPRESSION: Hypoventilation with decreased lung volume and right lower lobe atelectasis. No acute abnormality Electronically Signed   By: Franchot Gallo M.D.   On: 03/18/2020 10:17    Procedures .Critical Care Performed by: Tacy Learn, PA-C Authorized by: Tacy Learn, PA-C   Critical care provider statement:    Critical care time (minutes):  45   Critical care was time spent personally by me on the following activities:  Discussions with consultants, evaluation of patient's response to treatment, examination of patient, ordering and performing treatments and interventions, ordering and review of laboratory studies, ordering and review of radiographic studies, pulse oximetry, re-evaluation of patient's  condition, obtaining history from patient or surrogate and review of old charts   (including critical care time)  Medications Ordered in ED Medications  sodium chloride flush (NS) 0.9 % injection 3 mL (3 mLs Intravenous Not Given 03/18/20 1112)  isosorbide mononitrate (IMDUR) 24 hr tablet 60 mg (60 mg Oral Given 03/18/20 1305)  carvedilol (COREG) tablet 3.125 mg (has no administration in time range)  alum & mag hydroxide-simeth (MAALOX/MYLANTA) 200-200-20 MG/5ML suspension 30 mL (30 mLs Oral Given 03/18/20 1137)    And  lidocaine (XYLOCAINE) 2 % viscous mouth solution 15 mL (15 mLs Oral Given 03/18/20 1136)  aspirin chewable tablet 324 mg (324 mg Oral Given 03/18/20 1135)  hydrALAZINE (APRESOLINE) injection 20 mg (20 mg Intravenous Given 03/18/20 1307)    ED Course  I have reviewed the triage vital signs and the nursing notes.  Pertinent labs & imaging results that were available during my care of the patient were reviewed by me and considered in my medical decision making (see chart for details).  Clinical Course as of Mar 18 1344  Tue Mar 18, 2020  1254 86yo female with complex cardiac history presents with complaint of chest pain, onset 7AM, described as a "pain" in the center of the chest, radiates across the shoulders, nothing makes pain better (no relief with nitro x 2 at home), nothing makes pain worse including exertion. No associated symptoms.  On exam, TTP bilateral trapezius areas, no chest wall pain.  Mild lower extremity pitting edema, patient states this has improved after taking her fluid pill yesterday and does not urinary frequency.  BP initially 167/96 on arrival, has been as high as 255/104. Reports slight improvement in her chest discomfort after taking a GI cocktail. Discussed with Dr. [name] Heck, ER attending who has seen the patient, will consult for admission for CP, hypertension. EKG unchanged- no acute ischemic changes. Initial troponin 11, CBC and BMP without significant  findings. UA with possible UTI- trace leukocytes with 6-10 WBC and rare bacteria- will add cx.    [LM]  1308 Case discussed with Wannetta Sender, cards master, audiology to consult.   [LM]  1344 Blood pressure is improved, currently 195/89.   [LM]    Clinical Course User Index [LM] Roque Lias   MDM Rules/Calculators/A&P HEAR Score: 5                     Final Clinical Impression(s) / ED Diagnoses Final diagnoses:  Chest pain, unspecified type  Hypertensive urgency    Rx / DC Orders ED Discharge Orders    None       Tacy Learn, PA-C 03/18/20 1345    Noemi Chapel, MD 03/19/20 [phone]

## 2020-03-18 NOTE — Progress Notes (Signed)
[facility] for Heparin Indication: chest pain/ACS  Allergies  Allergen Reactions  . Fish Allergy Hives and Shortness Of Breath  . Ivp Dye [Iodinated Diagnostic Agents] Shortness Of Breath and Anaphylaxis  . Latex Anaphylaxis, Hives, Shortness Of Breath, Itching and Other (See Comments)    REACTION: wheezing  . Shellfish Allergy Anaphylaxis, Hives, Shortness Of Breath and Other (See Comments)    All seafood  . Shellfish-Derived Products Anaphylaxis and Hives  . Other Itching and Other (See Comments)    Patient reports allergy to perfumed detergents. Causes itching.  . Metformin Nausea Only and Nausea And Vomiting    Patient Measurements: Height: 5\' 1"  (154.9 cm) Weight: 73 kg (161 lb) IBW/kg (Calculated) : 47.8 Heparin Dosing Weight: 63.7 kg  Vital Signs: Temp: 98.5 F (36.9 C) (04/06 0924) Temp Source: Oral (04/06 0924) BP: 100/48 (04/06 1750) Pulse Rate: 68 (04/06 1750)  Labs: Recent Labs    03/18/20 1144 03/18/20 1246  HGB 14.0  --   HCT 46.2*  --   PLT 214  --   CREATININE 0.93  --   TROPONINIHS 11 11    Estimated Creatinine Clearance: 41.2 mL/min (by C-G formula based on SCr of 0.93 mg/dL).   Medical History: Past Medical History:  Diagnosis Date  . Allergy to IVP dye   . Atherosclerotic heart disease native coronary artery w/angina pectoris ([place]) 1992   a. 1992 s/p PTCA of LAD;  b. 1998 CABG x 3; c. 07/2001 Cath: Sev LM/LAD/LCX dzs, 3/3 patent grafts; d. 11/2013 Cath: stable graft anatomy; e. 10/2016 Cath: native 3VD, VG->OM nl, LIMA->LAD nl, VG->Diag 55.  . Atrial septal aneurysm   . Bradycardia, mild briefly to the 40s, asymptomatic 05/16/2013  . Carotid arterial disease ([place])    a. 05/2014 Carotid U/S: No signif bilat ICA stenosis, >60% L ECA.  Marland Kitchen Chronic anemia   . Chronic diastolic CHF (congestive heart failure) ([place])    a. 10/2016 Echo: Ef 55-60%, no rwma, Gr1 DD, triv AI, PASP 24mmHg, atrial septal aneurysm.  . DM  (diabetes mellitus), type 2 with peripheral vascular complications (HCC)    On Oral medications.  . Essential hypertension   . GERD (gastroesophageal reflux disease)   . Hyperlipidemia with target LDL less than 70   . PAD (peripheral artery disease) ([place]) 05/2013; 09/2013   a. severe calcified POP-1 & TP Trunk Dz bilat;  b . 05/2013 Diamondback Rot Athrectomy (R POP) --> PTA R Pop, DES to R Peroneal;  c. 09/2013 PTA/Stenting of L Pop [name] - retrograde access);  d. 04/2014 ABI's: R/L: 1.1/1.1.  Marland Kitchen PUD (peptic ulcer disease)   . S/P CABG x 3 1998   LIMA-LAD, SVG-OM, SVG-D1  . Severe claudication ([place]) 05/2013   Referred for Peripheral Angio (Dr. Gwenlyn Found --> Dr. Brunetta Jeans in [place])    Medications:  Scheduled:  . [START ON 03/19/2020] aspirin EC  81 mg Oral Daily  . carvedilol  3.125 mg Oral BID  . [START ON 03/19/2020] clopidogrel  75 mg Oral Daily  . [START ON Q000111Q folic acid  1 mg Oral Daily  . gabapentin  300 mg Oral BID  . heparin  3,800 Units Intravenous Once  . [START ON 03/19/2020] isosorbide mononitrate  90 mg Oral Daily  . [START ON 03/19/2020] pantoprazole  40 mg Oral Daily  . polyvinyl alcohol  1 drop Both Eyes QHS  . [START ON 03/19/2020] predniSONE  5 mg Oral Q breakfast  . rosuvastatin  20 mg Oral QHS  . sodium chloride flush  3 mL Intravenous Q12H  . [START ON 03/19/2020] sulfaSALAzine  1,000 mg Oral Daily    Assessment: Patient is a 72 yof that is being admitted for severe chest pain. The CP did respond to Ntg however on last administration the patient became profoundly hypotensive. The patient was r/o for a dissection, and with continued CP pharmacy has been asked to dose heparin at this time.   Goal of Therapy:  Heparin level 0.3-0.7 units/ml Monitor platelets by anticoagulation protocol: Yes   Plan:  - Heparin bolus 3800 units IV x 1 dose - Heparin drip @ 750 units/hr - Heparin level in ~ 6 hours  - Monitor patient for s/s of bleeding and CBC while on heparin    Duanne Limerick PharmD. BCPS  03/18/2020,5:57 PM

## 2020-03-18 NOTE — ED Provider Notes (Signed)
Called to the bedside as the patient had a profound drop in her blood pressure after receiving a dose of sublingual nitroglycerin.  Her pressure went from 152/88 to 66/40.  She reported significant worsening of her chest pain during this, but was mentating well.  HR remained in the 80's.  No tachypnea or hypoxia.  I reviewed her MRA which showed no dissection or aortic aneurysm.  I instructed her nurse to hang a fluid bolus, and I've ordered peripheral levophed to the bedside in case her pressure does not improve with the fluids.  I suspect this may be related to her nitroglycerin and may rebound up shortly.   Less likely a PE with no tachypnea, respiratory complaints or hypoxia.  Repeat ECG shows no STEMI, no acute ischemic changes.  I've asked for another EKG in 15 minutes  Dr Lorin Mercy the hospitalist was contacted.  I gave 25 mcg of fentanyl for pain.    Update:  blood pressure improved without needing vasopressors.  Suspect this was induced by her SL nitro.   Wyvonnia Dusky, MD 03/19/20 [phone]

## 2020-03-18 NOTE — Consult Note (Addendum)
Cardiology Consultation:   Patient ID: [name] MRN: [id number]; DOB: 02/27/1933  Admit date: 03/18/2020 Date of Consult: 03/18/2020  Primary Care Provider: Jani Gravel, MD Primary Cardiologist: Monica Hew, MD  / [facility] - Dr. Gwenlyn Flores Primary Electrophysiologist:  None   Patient Profile:   [name] is a 86 y.o. female with a hx of CAD (PTCA of LAD 1992, CABGx3 1998), PVD s/p remote LE interventions (followed by Dr. Gwenlyn Flores), IV dye allergy, dyslipidemia, HTN, chronic diastolic CHF with chronic L>R lower extremity edema, chronic anemia, asymptomatic bradycardia, atrial septal aneurysm on echo, GERD, peptic ulcer disease, RA on chronic prednisone who is being seen today for the evaluation of chest pain at the request of Dr. Percell Flores .  History of Present Illness:   She is followed by Dr. Ellyn Flores for CAD and Dr. Gwenlyn Flores for PAD. Her last cath was in 2017 with findings outlined below, not flow limiting enough to cause symptoms, medical therapy recommended for blood pressure, elevated LVEDP and microvascular disease. Myoview stress test performed for preoperative clearance 09/15/2018 was nonischemic and low risk. She was recently seen by Dr. Gwenlyn Flores 02/2020 for follow-up of PAD. She also was in need of clearance for an elective knee replacement. She reported taking occasional SL NTG for chest pain which was not new - sporadic use, maybe a few times a month, i.e. if out and about at a store pushing herself. Lasix was increased for her edema and a nuclear stress test was ordered. She indicates she had too much going on to get to the study on 4/1 so it was rescheduled to 03/26/20. She has not noticed any recent escalation in anginal symptoms before today.  She woke up around 6am this morning with deep substernal chest pain across her chest and into her shoulder blades into her back. It has been constant/unrelenting feel stabbing at times. She took 2 SL NTG without any relief so got scared and asked her daughter to  come to the ER. Here she is markedly hypertensive to a peak of 255/104. She does not follow her BP at home but reports compliance with meds. She's been treated with aspirin, GI cocktail, 20mg  IV hydralazine, 60mg  of Imdur. There is report the patient had improvement with GI cocktail but now she says she is feeling worse than when she arrived. She is having ongoing 7-8/10 chest pain. She felt SOB shortly earlier but not now. No n/v, diaphoresis. With the chest pain she has also felt that her feet feel strange, like numb although still with ability to feel them. No acute neurologic symptoms otherwise. hsTroponins negative x 2, Covid test pending, Hgb 14, K and Cr normal, CXR hypoventilation with decreased lung volume and RLL atelectasis without acute abnormalities. Hr was in the 50s-70s earlier but she has become more tachycardic (sinus) as the pain has escalated. SPO2 is normal. EKG shows NSR without dramatic STT changes. She is being taken acutely to MR for MR angiogram to exclude aortic dissection. She has history of IVP dye allergy.    Past Medical History:  Diagnosis Date  . Atherosclerotic heart disease native coronary artery w/angina pectoris ([place]) 1992   a. 1992 s/p PTCA of LAD;  b. 1998 CABG x 3; c. 07/2001 Cath: Sev LM/LAD/LCX dzs, 3/3 patent grafts; d. 11/2013 Cath: stable graft anatomy; e. 10/2016 Cath: native 3VD, VG->OM nl, LIMA->LAD nl, VG->Diag 55.  . Bradycardia, mild briefly to the 40s, asymptomatic 05/16/2013  . Carotid arterial disease ([place])    a.  05/2014 Carotid U/S: No signif bilat ICA stenosis, >60% L ECA.  Marland Kitchen Chronic anemia   . Chronic diastolic CHF (congestive heart failure) ([place])    a. 10/2016 Echo: Ef 55-60%, no rwma, Gr1 DD, triv AI, PASP 77mmHg, atrial septal aneurysm.  . DM (diabetes mellitus), type 2 with peripheral vascular complications (H[facility])    On Oral medications.  . Essential hypertension   . GERD (gastroesophageal reflux disease)   . Hyperlipidemia with target LDL less  than 70   . PAD (peripheral artery disease) ([place]) 05/2013; 09/2013   a. severe calcified POP-1 & TP Trunk Dz bilat;  b . 05/2013 Diamondback Rot Athrectomy (R POP) --> PTA R Pop, DES to R Peroneal;  c. 09/2013 PTA/Stenting of L Pop [name] - retrograde access);  d. 04/2014 ABI's: R/L: 1.1/1.1.  Marland Kitchen Peptic ulcer disease   . S/P CABG x 3 1998   LIMA-LAD, SVG-OM, SVG-D1  . Severe claudication ([place]) 05/2013   Referred for Peripheral Angio (Dr. Gwenlyn Flores --> [name] in [facility])    Past Surgical History:  Procedure Laterality Date  . ATHERECTOMY Right 05/15/2013   Procedure: ATHERECTOMY;  Surgeon: Monica Harp, MD;  Location: [facility] CATH LAB;  Service: Cardiovascular;  Laterality: Right;  popliteal  . BACK SURGERY    . CARDIAC CATHETERIZATION  07/17/01   2 v CAD with LM, circ, LAD, obtuse diag, mod stenosis of diag vein graft , mild stenosis of marg vein graft, nl EF, medical treatment  . CARDIAC CATHETERIZATION  11/2013   Widely patent LIMA-LAD & SVG-OM with mild progression of proximal stenosis ~40-50% in SVG-D1; Widely patent native RCA with known severe native LCA disease  . CARDIAC CATHETERIZATION N/A 11/01/2016   Procedure: Left Heart Cath and Cors/Grafts Angiography;  Surgeon: Monica Man, MD;  Location: [place] CV LAB;  Service: Cardiovascular: Hemodynamics: High LVEDP!.  Cors/Grafts: LM 55%, o-p LAD 100% then after D1 -> LIMA-LAD patent, SVG-Diag ~55%. small RI wiht ~70% ostial. O-p Cx 80% - pCx 70% --> SVG-bifurcating OM patent. -- med Rx  . CORONARY ANGIOPLASTY  1992   PCI to LAD  . CORONARY ARTERY BYPASS GRAFT  1998   LIMA-LAD, SVG-diagonal (none roughly 50% stenosis), SVG-OM  . LEFT HEART CATHETERIZATION WITH CORONARY ANGIOGRAM N/A 11/27/2013   Procedure: LEFT HEART CATHETERIZATION WITH CORONARY ANGIOGRAM;  Surgeon: Monica Man, MD;  Location: [facility] CATH LAB;  Service: Cardiovascular;  Laterality: N/A;  . LOW EXTREMITY DOPPLERS/ABI  10/2016   Patent right SFA, popliteal and  peroneal tendons. Patent left SFA and popliteal stent. 30-50% stenosis in the right common femoral and popliteal arteries, 50-74% stenosis in left profunda femoris artery. --> 1 year follow-up.  RABI - 1.2, LABI 1.1  . LOWER EXTREMITY ANGIOGRAM N/A 02/22/2013   Procedure: LOWER EXTREMITY ANGIOGRAM;  Surgeon: Monica Harp, MD;  Location: [name] CATH LAB;  Service: Cardiovascular;  Laterality: N/A;  . LOWER EXTREMITY ANGIOGRAM N/A 07/20/2013   Procedure: LOWER EXTREMITY ANGIOGRAM;  Surgeon: Monica Harp, MD;  Location: [facility] CATH LAB;  Service: Cardiovascular;  Laterality: N/A;  . NM MYOVIEW LTD  04/03/2013; 5/26'/2016   Lexiscan: a)  Apical perfusion defect with no ischemia. Consider prior infarct versus breast attenuation.;; b) 5/'16: LOW RISK, Nl EF ~55-65%, No Ischemia /Infarction  . NM MYOVIEW LTD  09/2018   LOW RISK.  EF 55%.  No ischemia or infarction.  Marland Kitchen PERCUTANEOUS STENT INTERVENTION Right 05/15/2013   Procedure: PERCUTANEOUS STENT INTERVENTION;  Surgeon: Monica Harp,  MD;  Location: [place] CATH LAB;  Service: Cardiovascular;  Laterality: Right;  tibioperoneal trunk  . Peroneal Artery Stent  05/15/13   Diamondback rot. atherectomy of high grade segmental popliteal stenosis then Chocolate balloonPTA; then angiosculpt PTA of the prox peroneal with stent-Xpedition   . pv angio  07/20/13   high-grade calcified popliteal disease with one-vessel runoff via a small anterior tibial artery that has proximal disease as well, no intervention  . TOTAL HIP ARTHROPLASTY Right 10/12/2018   Procedure: RIGHT TOTAL HIP ARTHROPLASTY ANTERIOR APPROACH;  Surgeon: Mcarthur Rossetti, MD;  Location: [facility] ORS;  Service: Orthopedics;  Laterality: Right;  . TRANSTHORACIC ECHOCARDIOGRAM  10/2016   EF 55-60%. Gr 1 DD. Normal PAP. Aortic Sclerosis.  . TRANSTHORACIC ECHOCARDIOGRAM  09/2018   Normal LV size and function with vigorous EF 65 to 70%.  GR 1 DD.  Severe LA dilation (suggestive of probably worsening: GR 1 DD).  .  TUBAL LIGATION       Home Medications:  Prior to Admission medications   Medication Sig Start Date End Date Taking? Authorizing Provider  acetaminophen (TYLENOL) 500 MG tablet Take 1 tablet (500 mg total) by mouth daily as needed for moderate pain. LEG PAIN. PLEASE DISCUSS STARTING A MEDICATION FOR PAIN MANAGEMENT WITH YOUR PCP. 06/26/19   Monica Harp, MD  carvedilol (COREG) 3.125 MG tablet TAKE 1 TABLET (3.125 MG TOTAL) BY MOUTH 2 (TWO) TIMES DAILY. 06/12/19 03/04/20  Monica Man, MD  clopidogrel (PLAVIX) 75 MG tablet Take 75 mg by mouth daily.    [provider]  Coenzyme Q10 (CO Q 10 PO) Take 1 tablet by mouth every other day.     [provider]  ferrous sulfate 325 (65 FE) MG tablet Take 1 tablet (325 mg total) by mouth daily with breakfast. Patient taking differently: Take 27 mg by mouth daily with breakfast.  02/05/19   Monica Man, MD  folic acid (FOLVITE) 1 MG tablet Take 1 mg by mouth daily. 08/21/18   [provider]  furosemide (LASIX) 40 MG tablet Take 40 Mg (1 tablet) daily 03/04/20   Monica Harp, MD  gabapentin (NEURONTIN) 300 MG capsule TAKE 1 CAPSULE BY MOUTH THREE TIMES A DAY Patient taking differently: 2 (two) times daily.  01/01/19   Wallene Huh, DPM  glimepiride (AMARYL) 1 MG tablet Take 2 mg by mouth daily with breakfast.  05/02/18   [provider]  HYDROcodone-acetaminophen (NORCO/VICODIN) 5-325 MG tablet Take 1 tablet by mouth every 6 (six) hours as needed for moderate pain. 07/03/19   Mcarthur Rossetti, MD  isosorbide mononitrate (IMDUR) 60 MG 24 hr tablet TAKE 1.5 TABLETS (90 MG TOTAL) BY MOUTH DAILY. . 12/21/19   Monica Harp, MD  LORazepam (ATIVAN) 0.5 MG tablet Take 0.5 mg by mouth every 6 (six) hours as needed for anxiety or sleep.  03/17/18   [provider]  methocarbamol (ROBAXIN) 500 MG tablet Take 1 tablet (500 mg total) by mouth every 6 (six) hours as needed for muscle spasms. 11/01/18    Pete Pelt, PA-C  methotrexate (RHEUMATREX) 2.5 MG tablet Take 20 mg by mouth once a week. Every Monday of each week 08/21/18   [provider]  nitroGLYCERIN (NITROSTAT) 0.4 MG SL tablet PLACE 1 TABLET (0.4 MG TOTAL) UNDER THE TONGUE EVERY 5 (FIVE) MINUTES AS NEEDED FOR CHEST PAIN. 02/13/18   Theora Gianotti, NP  pantoprazole (PROTONIX) 40 MG tablet Take 1 tablet (40 mg total)  by mouth 2 (two) times daily before a meal. Patient taking differently: Take 40 mg by mouth daily.  08/13/17   Daune Perch, NP  Polyethyl Glycol-Propyl Glycol (SYSTANE ULTRA) 0.4-0.3 % SOLN Place 1 drop into both eyes at bedtime.    [provider]  predniSONE (DELTASONE) 5 MG tablet Take 5 mg by mouth daily with breakfast.     [provider]  rosuvastatin (CRESTOR) 20 MG tablet Take 20 mg by mouth at bedtime.     [provider]  sulfaSALAzine (AZULFIDINE) 500 MG EC tablet Take 1,000 mg by mouth daily.  05/29/18   [provider]  traMADol (ULTRAM) 50 MG tablet Take 1 tablet (50 mg total) by mouth every 8 (eight) hours as needed for severe pain. 02/14/20   Pete Pelt, PA-C    Inpatient Medications: Scheduled Meds: . [START ON 03/19/2020] aspirin EC  81 mg Oral Daily  . carvedilol  3.125 mg Oral BID  . [START ON 03/19/2020] clopidogrel  75 mg Oral Daily  . enoxaparin (LOVENOX) injection  40 mg Subcutaneous Q24H  . [START ON Q000111Q folic acid  1 mg Oral Daily  . gabapentin  300 mg Oral BID  . [START ON 03/19/2020] isosorbide mononitrate  90 mg Oral Daily  . [START ON 03/19/2020] pantoprazole  40 mg Oral Daily  . polyvinyl alcohol  1 drop Both Eyes QHS  . [START ON 03/19/2020] predniSONE  5 mg Oral Q breakfast  . rosuvastatin  20 mg Oral QHS  . sodium chloride flush  3 mL Intravenous Q12H  . [START ON 03/19/2020] sulfaSALAzine  1,000 mg Oral Daily   Continuous Infusions: . sodium chloride     PRN Meds: sodium chloride, acetaminophen, HYDROcodone-acetaminophen,  LORazepam, methocarbamol, morphine injection, nitroGLYCERIN, ondansetron (ZOFRAN) IV, sodium chloride flush, traMADol  Allergies:    Allergies  Allergen Reactions  . Fish Allergy Hives and Shortness Of Breath  . Ivp Dye [Iodinated Diagnostic Agents] Shortness Of Breath and Anaphylaxis  . Latex Anaphylaxis, Hives, Shortness Of Breath, Itching and Other (See Comments)    REACTION: wheezing  . Shellfish Allergy Anaphylaxis, Hives, Shortness Of Breath and Other (See Comments)    All seafood  . Shellfish-Derived Products Anaphylaxis and Hives  . Other Itching and Other (See Comments)    Patient reports allergy to perfumed detergents. Causes itching.  . Metformin Nausea Only and Nausea And Vomiting    Social History:   Social History   Socioeconomic History  . Marital status: Married    Spouse name: Not on file  . Number of children: Not on file  . Years of education: Not on file  . Highest education level: Not on file  Occupational History  . Occupation: retired  Tobacco Use  . Smoking status: Never Smoker  . Smokeless tobacco: Never Used  Substance and Sexual Activity  . Alcohol use: No    Comment: rare glass of wine   . Drug use: No  . Sexual activity: Not on file  Other Topics Concern  . Not on file  Social History Narrative    She is a married mother of 43, grandmother of 94, great grandmother of 9.    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    She does not really get a lot of exercise,    She does not smoke or drink EtOH.   Social Determinants of Health   Financial Resource Strain:   . Difficulty of Paying Living Expenses:  Food Insecurity:   . Worried About Charity fundraiser in the Last Year:   . Arboriculturist in the Last Year:   Transportation Needs:   . Film/video editor (Medical):   Marland Kitchen Lack of Transportation (Non-Medical):   Physical Activity:   . Days of Exercise per Week:   . Minutes of Exercise per Session:   Stress:   .  Feeling of Stress :   Social Connections:   . Frequency of Communication with Friends and Family:   . Frequency of Social Gatherings with Friends and Family:   . Attends Religious Services:   . Active Member of Clubs or Organizations:   . Attends Archivist Meetings:   Marland Kitchen Marital Status:   Intimate Partner Violence:   . Fear of Current or Ex-Partner:   . Emotionally Abused:   Marland Kitchen Physically Abused:   . Sexually Abused:     Family History:    Family History  Problem Relation Age of Onset  . Hypertension Mother   . Hyperlipidemia Mother   . Hyperlipidemia Father   . Hypertension Father      ROS:  Please see the history of present illness.  Also reports dysuria and recent eval for UTI, to be managed by IM. All other ROS reviewed and negative.     Physical Exam/Data:   Vital Signs. BP (!) 185/94   Pulse 96   Temp 98.5 F (36.9 C) (Oral)   Resp 17   SpO2 100%  General: Well developed, well nourished AAF, appears uncomfortable (motioning to chest) Head: Normocephalic, atraumatic, sclera non-icteric, no xanthomas, nares are without discharge. Neck: Faint right carotid bruit, no left carotid bruit. JVP not elevated. Lungs: Clear bilaterally to auscultation without wheezes, rales, or rhonchi. Breathing is unlabored. Heart: RRR S1 S2 without murmurs, rubs, or gallops.  Abdomen: Soft, non-tender, non-distended with normoactive bowel sounds. No rebound/guarding. Extremities: No clubbing or cyanosis. Trace-1+ BLE edema. Distal pedal pulses are 1+ on the right, 2+ on th left, extremities are warm with good capillary refill Neuro: Alert and oriented X 3. Moves all extremities spontaneously. Sensation in tact bilateral legs although she states they just feel "funny" Psych:  Responds to questions appropriately with a normal affect.   EKG:  The EKG was personally reviewed and demonstrates:  NSR 86bpm nonspecific STT changes, appears generally similar to 03/04/20  Telemetry:   Telemetry was personally reviewed and demonstrates: NSR  Relevant CV Studies: Cardiac Cath 11/01/16 Conclusion   LV end diastolic pressure is mildly elevated. In the setting of systemic hypertension  SVG-diagonal is moderate in size. Origin lesion, 55 %stenosed.  LM lesion, 55 %stenosed.  Ost LAD lesion, 100 %stenosed.  Prox LAD to Mid LAD lesion, 100 %stenosed. After D1.  Very small caliber Ost Ramus lesion, 70 %stenosed.  Ost Cx to Prox Cx lesion, 80 %stenosed. Prox Cx lesion, 70 %stenosed.  SVG-OM is large and anatomically normal. The flow in the graft is reversed. The bifurcating Cx OM is relatively normal.  Very tortuous L Subclavian artery - unable to perform L Radial Cath.    Patient has relatively stable coronary disease with maybe mild progression of the vein graft to the diagonal branch ostial disease that was maybe 40% previously. This does not appear to be enough flow limiting to cause resting symptoms, however most likely etiology is accelerated hypertension, accelerated LVEDP and microvascular disease with a small ramus branch and other branches from the native system.  PLAN:  Admit to step down for ongoing blood pressure control. Will use IV nitroglycerin overnight. I will increase carvedilol to 6.25 twice a day and add amlodipine.  Continue aspirin, Plavix and statin  Can stop IV heparin Continue medical management otherwise. Will restart Imdur once her nitroglycerin is weaned off.  [name], M.D., M.S. Interventional Cardiologist    2D Echo 04/2015 - Left ventricle: The cavity size was normal. Wall thickness was  increased in a pattern of mild LVH. Systolic function was normal.  The estimated ejection fraction was in the range of 60% to 65%.  Doppler parameters are consistent with abnormal left ventricular  relaxation (grade 1 diastolic dysfunction).     Laboratory Data:  High Sensitivity Troponin:   Recent Labs  Lab 03/18/20 1144  03/18/20 1246  TROPONINIHS 11 11     Chemistry Recent Labs  Lab 03/18/20 1144  NA 140  K 4.0  CL 99  CO2 28  GLUCOSE 175*  BUN 11  CREATININE 0.93  CALCIUM 10.0  GFRNONAA 56*  GFRAA >60  ANIONGAP 13    No results for input(s): PROT, ALBUMIN, AST, ALT, ALKPHOS, BILITOT in the last 168 hours. Hematology Recent Labs  Lab 03/18/20 1144  WBC 5.4  RBC 5.05  HGB 14.0  HCT 46.2*  MCV 91.5  MCH 27.7  MCHC 30.3  RDW 14.0  PLT 214   BNPNo results for input(s): BNP, PROBNP in the last 168 hours.  DDimer No results for input(s): DDIMER in the last 168 hours.   Radiology/Studies:  DG Chest 2 View  Result Date: 03/18/2020 CLINICAL DATA:  Chest pain EXAM: CHEST - 2 VIEW COMPARISON:  11/17/2019 FINDINGS: Hypoventilation with decreased lung volume. Right lower lobe atelectasis and elevated right hemidiaphragm unchanged. Postop CABG.  Negative for heart failure or effusion. IMPRESSION: Hypoventilation with decreased lung volume and right lower lobe atelectasis. No acute abnormality Electronically Signed   By: Franchot Gallo M.D.   On: 03/18/2020 10:17       HEAR Score (for undifferentiated chest pain):  HEAR Score: 5    Assessment and Plan:   1. Severe chest pain - [name] presents with severe chest pain and accelerated HTN. She has been treated with ASA, Imdur, hydralazine and GI cocktail. SBP is now in the 160-170 range but her pain continues. I am concerned for acute aortic dissection. It appears that the internal medicine team also shares this concern as she had CTA ordered but this has been changed to MRA (has significant IVP dye allergy). Surprisingly troponins have been negative despite several hours of constant chest pain. Will need to review plan for ischemic evaluation once MR results. Have also called echo dept to request stat echo.  2. Known CAD - as above. Plan contingent on results of MR.   3. Accelerated HTN - await imaging. Will need further acute management of  this. May consider renal duplex to exclude RAS given h/o PAD and acute marked HTN. TSH also pending.  4. PAD - reports parasthesias in feet. Distal pedal pulses are 2+ on the left and 1+ on the right. No overt evidence of acute vascular compromise. Feet are warm and good capillary refill. If MR is negative for dissection, would consider updating inpatient vascular testing. She also has a faint right carotid bruit that will need investigation when more stable.  5. Hyperlipidemia - anticipate continuation of statin. Lipid profile ordered for AM.  6. DM -  Per primary team.  7. Chronic diastolic CHF -  patient takes Lasix for chronic edema. She did take this yesterday as scheduled and reports edema has improved lying in bed ever since arriving to ED.  For questions or updates, please contact [place] Please consult [email or web address] for contact info under   Signed, Charlie Pitter, PA-C  03/18/2020 3:49 PM   Patient seen, examined. Available data reviewed. Agree with findings, assessment, and plan as outlined by Melina Copa, PA-C.  The patient is independently interviewed and examined.  Her daughter is at the bedside.  At the time my evaluation, the patient is in mild distress secondary to chest discomfort.  JVP is normal, there is a soft right carotid bruit, HEENT is normal, lung fields are clear bilaterally, heart is regular rate and rhythm with a soft ejection murmur at the right upper sternal border, abdomen is soft and nontender with positive bowel sounds, extremities demonstrate trace pretibial and ankle edema bilaterally, skin is warm and dry without rash.  All available data is reviewed.  The patient presents with concerning symptoms of ongoing resting chest discomfort.  However, initial test findings are reassuring with high-sensitivity troponin negative x2.  An MRA of the chest is obtained to evaluate for an acute aortic syndrome, but the study is negative and shows no evidence of aortic  dissection, hematoma, or other significant pathology.  Unstable angina is possible although her EKG has no acute changes and cardiac biomarkers are negative.  Considering her high pretest probability with known CAD status post CABG, advanced age, diffuse vascular disease, it seems reasonable to treat her for ACS with IV heparin.  She will continue on antiplatelet therapy with aspirin and clopidogrel.  We will check a Lexiscan Myoview stress test tomorrow morning as long as her enzymes remain negative and she does not continue to have chest discomfort.  We will treat her pain with IV morphine as needed.  We will add a D-dimer to her labs to evaluate for pulmonary embolus, but I think this is unlikely with no significant shortness of breath, pleuritic component to her chest pain, or hypoxemia.  Sherren Mocha, M.D. 03/18/2020 5:29 PM

## 2020-03-18 NOTE — ED Triage Notes (Signed)
Pt here for evaluation of central chest pain onset this morning after getting up. Took one nitro without relief.

## 2020-03-18 NOTE — Progress Notes (Signed)
  Echocardiogram 2D Echocardiogram has been performed.  Bobbye Charleston 03/18/2020, 5:09 PM

## 2020-03-18 NOTE — ED Provider Notes (Signed)
Medical screening examination/treatment/procedure(s) were conducted as a shared visit with non-physician practitioner(s) and myself.  I personally evaluated the patient during the encounter.  Clinical Impression:   Final diagnoses:  Chest pain, unspecified type  Hypertensive urgency   This patient is an 86 year old female presenting to the hospital with a complaint of chest pain going up to her shoulders and across her upper back.  She reports that she has had this going on this morning, it is not associated with shortness of breath nausea or diaphoresis.  She does endorse lower extremity swelling but this has improved since taking increasing doses of Lasix, she is now on 40 mg daily.  On exam the patient does have 1-2+ symmetrical lower extremity pitting edema below the knees, clear lungs, soft systolic murmur but regular heart rate, normal pulses and no JVD.  Labs are rather unremarkable but the patient is severely hypertensive measuring as high as 250/100 at times, currently 228/88.  She does have a mild tenderness in her suprapubic area and feels like she needs to urinate,  We will rule out urinary tract infection, rule out elevated troponin or ischemia, she will need blood pressure control and likely will need to be admitted to the hospital due to severe hypertension in the presence of chest pain, the patient is agreeable to the plan   EKG Interpretation  Date/Time:  Tuesday March 18 2020 09:42:00 EDT Ventricular Rate:  86 PR Interval:  180 QRS Duration: 76 QT Interval:  392 QTC Calculation: 469 R Axis:   58 Text Interpretation: Normal sinus rhythm Nonspecific ST abnormality Abnormal ECG since last tracing no significant change Confirmed by Noemi Chapel [id number]) on 03/18/2020 11:30:21 AM         Noemi Chapel, MD 03/19/20 [phone]

## 2020-03-18 NOTE — H&P (Signed)
History and Physical    [name] [id number] DOB: 05/11/1933 DOA: 03/18/2020  PCP: Jani Gravel, MD Consultants:  Gwenlyn Found - cardiology; Aryal - rheumatology; Avernini - endocrinology; Ninfa Linden - orthopedics; Penumalli - neurology Patient coming from:  Home - lives alone; NOK: Daughter, Tedra [place], [id number]  Chief Complaint:  Chest pain  HPI: [name] is a 86 y.o. female with medical history significant of PAD; CAD s/p CABG; HLD; HTN; DM; and chronic diastolic CHF presenting with chest pain.  She last saw Dr. Gwenlyn Found in the clinic on 3/23 and reported occasional CP that was NTG-responsive and so she was planned for a Lexiscan on 4/1 but she did not show up for the test and so it was rescheduled for 4/14.  She reports that this was pre-op for hip surgery.  She reports that she is supposed to go tomorrow for LE arterial duplex.   She developed chest pain this AM from 7 this AM.  He was consistent and continued to hurt with radiation of pain into her back and shoulders.  No h/o similar pain.  She belched twice but the pain has not improved.  She is hurting in her right groin, and in her back across her shoulders as well as in her chest.  She takes NTG at home with improvement usually, but she took it twice at home without improvement.  Has not noticed SOB.  She does not remember if she had pain with her prior CABG.  She has a reported anaphylactic allergy to contrast dye.    ED Course:  Significant ASCVD history.  CP at 0700, took NTG x 2 without relief.  BP ok - 167/96, 154/100.  EKG ok.  Troponin 11, second is in process.  Urinary frequency, no other symptoms, ?UTI.  Review of Systems: As per HPI; otherwise review of systems reviewed and negative.   Ambulatory Status:  Ambulates with a cane  COVID Vaccine Status:   Complete  Past Medical History:  Diagnosis Date   Allergy to IVP dye    Atherosclerotic heart disease native coronary artery w/angina pectoris ([place]) 1992   a. 1992  s/p PTCA of LAD;  b. 1998 CABG x 3; c. 07/2001 Cath: Sev LM/LAD/LCX dzs, 3/3 patent grafts; d. 11/2013 Cath: stable graft anatomy; e. 10/2016 Cath: native 3VD, VG->OM nl, LIMA->LAD nl, VG->Diag 55.   Atrial septal aneurysm    Bradycardia, mild briefly to the 40s, asymptomatic 05/16/2013   Carotid arterial disease ([place])    a. 05/2014 Carotid U/S: No signif bilat ICA stenosis, >60% L ECA.   Chronic anemia    Chronic diastolic CHF (congestive heart failure) ([place])    a. 10/2016 Echo: Ef 55-60%, no rwma, Gr1 DD, triv AI, PASP 69mmHg, atrial septal aneurysm.   DM (diabetes mellitus), type 2 with peripheral vascular complications (HCC)    On Oral medications.   Essential hypertension    GERD (gastroesophageal reflux disease)    Hyperlipidemia with target LDL less than 70    PAD (peripheral artery disease) ([place]) 05/2013; 09/2013   a. severe calcified POP-1 & TP Trunk Dz bilat;  b . 05/2013 Diamondback Rot Athrectomy (R POP) --> PTA R Pop, DES to R Peroneal;  c. 09/2013 PTA/Stenting of L Pop [name] - retrograde access);  d. 04/2014 ABI's: R/L: 1.1/1.1.   PUD (peptic ulcer disease)    S/P CABG x 3 1998   LIMA-LAD, SVG-OM, SVG-D1   Severe claudication ([place]) 05/2013   Referred for Peripheral Angio (Dr.  Berry --> Dr. Brunetta Jeans in [place])    Past Surgical History:  Procedure Laterality Date   ATHERECTOMY Right 05/15/2013   Procedure: ATHERECTOMY;  Surgeon: Lorretta Harp, MD;  Location: [facility] CATH LAB;  Service: Cardiovascular;  Laterality: Right;  popliteal   BACK SURGERY     CARDIAC CATHETERIZATION  07/17/01   2 v CAD with LM, circ, LAD, obtuse diag, mod stenosis of diag vein graft , mild stenosis of marg vein graft, nl EF, medical treatment   CARDIAC CATHETERIZATION  11/2013   Widely patent LIMA-LAD & SVG-OM with mild progression of proximal stenosis ~40-50% in SVG-D1; Widely patent native RCA with known severe native LCA disease   CARDIAC CATHETERIZATION N/A 11/01/2016   Procedure:  Left Heart Cath and Cors/Grafts Angiography;  Surgeon: [name], MD;  Location: [facility] CV LAB;  Service: Cardiovascular: Hemodynamics: High LVEDP!.  Cors/Grafts: LM 55%, o-p LAD 100% then after D1 -> LIMA-LAD patent, SVG-Diag ~55%. small RI wiht ~70% ostial. O-p Cx 80% - pCx 70% --> SVG-bifurcating OM patent. -- med Rx   CORONARY ANGIOPLASTY  1992   PCI to LAD   CORONARY ARTERY BYPASS GRAFT  1998   LIMA-LAD, SVG-diagonal (none roughly 50% stenosis), SVG-OM   LEFT HEART CATHETERIZATION WITH CORONARY ANGIOGRAM N/A 11/27/2013   Procedure: LEFT HEART CATHETERIZATION WITH CORONARY ANGIOGRAM;  Surgeon: [name], MD;  Location: [facility] CATH LAB;  Service: Cardiovascular;  Laterality: N/A;   LOW EXTREMITY DOPPLERS/ABI  10/2016   Patent right SFA, popliteal and peroneal tendons. Patent left SFA and popliteal stent. 30-50% stenosis in the right common femoral and popliteal arteries, 50-74% stenosis in left profunda femoris artery. --> 1 year follow-up.  RABI - 1.2, LABI 1.1   LOWER EXTREMITY ANGIOGRAM N/A 02/22/2013   Procedure: LOWER EXTREMITY ANGIOGRAM;  Surgeon: Lorretta Harp, MD;  Location: [facility] CATH LAB;  Service: Cardiovascular;  Laterality: N/A;   LOWER EXTREMITY ANGIOGRAM N/A 07/20/2013   Procedure: LOWER EXTREMITY ANGIOGRAM;  Surgeon: Lorretta Harp, MD;  Location: [facility] CATH LAB;  Service: Cardiovascular;  Laterality: N/A;   NM MYOVIEW LTD  04/03/2013; 5/26'/2016   Lexiscan: a)  Apical perfusion defect with no ischemia. Consider prior infarct versus breast attenuation.;; b) 5/'16: LOW RISK, Nl EF ~55-65%, No Ischemia /Infarction   NM MYOVIEW LTD  09/2018   LOW RISK.  EF 55%.  No ischemia or infarction.   PERCUTANEOUS STENT INTERVENTION Right 05/15/2013   Procedure: PERCUTANEOUS STENT INTERVENTION;  Surgeon: Lorretta Harp, MD;  Location: [facility] CATH LAB;  Service: Cardiovascular;  Laterality: Right;  tibioperoneal trunk   Peroneal Artery Stent  05/15/13   Diamondback rot.  atherectomy of high grade segmental popliteal stenosis then Chocolate balloonPTA; then angiosculpt PTA of the prox peroneal with stent-Xpedition    pv angio  07/20/13   high-grade calcified popliteal disease with one-vessel runoff via a small anterior tibial artery that has proximal disease as well, no intervention   TOTAL HIP ARTHROPLASTY Right 10/12/2018   Procedure: RIGHT TOTAL HIP ARTHROPLASTY ANTERIOR APPROACH;  Surgeon: Mcarthur Rossetti, MD;  Location: [facility] ORS;  Service: Orthopedics;  Laterality: Right;   TRANSTHORACIC ECHOCARDIOGRAM  10/2016   EF 55-60%. Gr 1 DD. Normal PAP. Aortic Sclerosis.   TRANSTHORACIC ECHOCARDIOGRAM  09/2018   Normal LV size and function with vigorous EF 65 to 70%.  GR 1 DD.  Severe LA dilation (suggestive of probably worsening: GR 1 DD).   TUBAL LIGATION      Social History  Socioeconomic History   Marital status: Married    Spouse name: Not on file   Number of children: Not on file   Years of education: Not on file   Highest education level: Not on file  Occupational History   Occupation: retired  Tobacco Use   Smoking status: Never Smoker   Smokeless tobacco: Never Used  Substance and Sexual Activity   Alcohol use: No    Comment: rare glass of wine    Drug use: No   Sexual activity: Not on file  Other Topics Concern   Not on file  Social History Narrative    She is a married mother of 52, grandmother of 67, great grandmother of 36.    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    She does not really get a lot of exercise,    She does not smoke or drink EtOH.   Social Determinants of Health   Financial Resource Strain:    Difficulty of Paying Living Expenses:   Food Insecurity:    Worried About Charity fundraiser in the Last Year:    Arboriculturist in the Last Year:   Transportation Needs:    Film/video editor (Medical):    Lack of Transportation (Non-Medical):   Physical Activity:    Days  of Exercise per Week:    Minutes of Exercise per Session:   Stress:    Feeling of Stress :   Social Connections:    Frequency of Communication with Friends and Family:    Frequency of Social Gatherings with Friends and Family:    Attends Religious Services:    Active Member of Clubs or Organizations:    Attends Archivist Meetings:    Marital Status:   Intimate Partner Violence:    Fear of Current or Ex-Partner:    Emotionally Abused:    Physically Abused:    Sexually Abused:     Allergies  Allergen Reactions   Fish Allergy Hives and Shortness Of Breath   Ivp Dye [Iodinated Diagnostic Agents] Shortness Of Breath and Anaphylaxis   Latex Anaphylaxis, Hives, Shortness Of Breath, Itching and Other (See Comments)    REACTION: wheezing   Shellfish Allergy Anaphylaxis, Hives, Shortness Of Breath and Other (See Comments)    All seafood   Shellfish-Derived Products Anaphylaxis and Hives   Other Itching and Other (See Comments)    Patient reports allergy to perfumed detergents. Causes itching.   Metformin Nausea Only and Nausea And Vomiting    Family History  Problem Relation Age of Onset   Hypertension Mother    Hyperlipidemia Mother    Hyperlipidemia Father    Hypertension Father     Prior to Admission medications   Medication Sig Start Date End Date Taking? Authorizing Provider  acetaminophen (TYLENOL) 500 MG tablet Take 1 tablet (500 mg total) by mouth daily as needed for moderate pain. LEG PAIN. PLEASE DISCUSS STARTING A MEDICATION FOR PAIN MANAGEMENT WITH YOUR PCP. 06/26/19   Lorretta Harp, MD  carvedilol (COREG) 3.125 MG tablet TAKE 1 TABLET (3.125 MG TOTAL) BY MOUTH 2 (TWO) TIMES DAILY. 06/12/19 03/04/20  [name], MD  clopidogrel (PLAVIX) 75 MG tablet Take 75 mg by mouth daily.    [provider]  Coenzyme Q10 (CO Q 10 PO) Take 1 tablet by mouth every other day.     [provider]  ferrous sulfate 325 (65 FE) MG  tablet Take 1  tablet (325 mg total) by mouth daily with breakfast. Patient taking differently: Take 27 mg by mouth daily with breakfast.  02/05/19   [name], MD  folic acid (FOLVITE) 1 MG tablet Take 1 mg by mouth daily. 08/21/18   [provider]  furosemide (LASIX) 40 MG tablet Take 40 Mg (1 tablet) daily 03/04/20   Lorretta Harp, MD  gabapentin (NEURONTIN) 300 MG capsule TAKE 1 CAPSULE BY MOUTH THREE TIMES A DAY Patient taking differently: 2 (two) times daily.  01/01/19   Wallene Huh, DPM  glimepiride (AMARYL) 1 MG tablet Take 2 mg by mouth daily with breakfast.  05/02/18   [provider]  HYDROcodone-acetaminophen (NORCO/VICODIN) 5-325 MG tablet Take 1 tablet by mouth every 6 (six) hours as needed for moderate pain. 07/03/19   Mcarthur Rossetti, MD  isosorbide mononitrate (IMDUR) 60 MG 24 hr tablet TAKE 1.5 TABLETS (90 MG TOTAL) BY MOUTH DAILY. . 12/21/19   Lorretta Harp, MD  LORazepam (ATIVAN) 0.5 MG tablet Take 0.5 mg by mouth every 6 (six) hours as needed for anxiety or sleep.  03/17/18   [provider]  methocarbamol (ROBAXIN) 500 MG tablet Take 1 tablet (500 mg total) by mouth every 6 (six) hours as needed for muscle spasms. 11/01/18   Pete Pelt, PA-C  methotrexate (RHEUMATREX) 2.5 MG tablet Take 20 mg by mouth once a week. Every Monday of each week 08/21/18   [provider]  nitroGLYCERIN (NITROSTAT) 0.4 MG SL tablet PLACE 1 TABLET (0.4 MG TOTAL) UNDER THE TONGUE EVERY 5 (FIVE) MINUTES AS NEEDED FOR CHEST PAIN. 02/13/18   Theora Gianotti, NP  pantoprazole (PROTONIX) 40 MG tablet Take 1 tablet (40 mg total) by mouth 2 (two) times daily before a meal. Patient taking differently: Take 40 mg by mouth daily.  08/13/17   Daune Perch, NP  Polyethyl Glycol-Propyl Glycol (SYSTANE ULTRA) 0.4-0.3 % SOLN Place 1 drop into both eyes at bedtime.    [provider]  predniSONE (DELTASONE) 5 MG tablet Take 5 mg by mouth daily  with breakfast.     [provider]  rosuvastatin (CRESTOR) 20 MG tablet Take 20 mg by mouth at bedtime.     [provider]  sulfaSALAzine (AZULFIDINE) 500 MG EC tablet Take 1,000 mg by mouth daily.  05/29/18   [provider]  traMADol (ULTRAM) 50 MG tablet Take 1 tablet (50 mg total) by mouth every 8 (eight) hours as needed for severe pain. 02/14/20   Pete Pelt, PA-C    Physical Exam: Vitals:   03/18/20 1515 03/18/20 1718 03/18/20 1723 03/18/20 1724  BP: (!) 185/94 (!) 152/88  (!) 152/88  Pulse: 96 97 (!) 101 (!) 102  Resp:   (!) 22 (!) 22  Temp:      TempSrc:      SpO2: 100% 98% 99% 99%      General:  Appears anxious, with her hand over her left chest and c/o active chest pain  Eyes:  PERRL, EOMI, normal lids, iris  ENT:  grossly normal hearing, lips & tongue, mmm; mostly absent dentition  Neck:  no LAD, masses or thyromegaly  Cardiovascular:  RRR, no m/r/g. No LE edema.   Respiratory:   CTA bilaterally with no wheezes/rales/rhonchi.  Normal respiratory effort.  Abdomen:  soft, NT, ND, NABS, no palpable mass concerning for AAA  Skin:  no rash or induration seen on limited exam  Musculoskeletal:  grossly normal tone BUE/BLE,  good ROM, no bony abnormality  Psychiatric:  anxious mood and affect, speech fluent and appropriate, AOx3  Neurologic:  CN 2-12 grossly intact, moves all extremities in coordinated fashion    Radiological Exams on Admission: DG Chest 2 View  Result Date: 03/18/2020 CLINICAL DATA:  Chest pain EXAM: CHEST - 2 VIEW COMPARISON:  11/17/2019 FINDINGS: Hypoventilation with decreased lung volume. Right lower lobe atelectasis and elevated right hemidiaphragm unchanged. Postop CABG.  Negative for heart failure or effusion. IMPRESSION: Hypoventilation with decreased lung volume and right lower lobe atelectasis. No acute abnormality Electronically Signed   By: Franchot Gallo M.D.   On: 03/18/2020 10:17   MR ANGIO CHEST W WO  CONTRAST  Result Date: 03/18/2020 CLINICAL DATA:  Chest pain. History of coronary artery disease. Allergy to iodinated contrast material. EXAM: MRA CHEST WITH OR WITHOUT CONTRAST TECHNIQUE: Angiographic images of the chest were obtained using MRA technique with intravenous contrast. CONTRAST:  50mL GADAVIST GADOBUTROL 1 MMOL/ML IV SOLN COMPARISON:  CT of the chest without contrast on 11/17/2019 FINDINGS: VASCULAR Aorta: The thoracic aorta is of normal caliber. There is no evidence of aneurysmal disease, dissection or intramural hemorrhage. Visualized proximal great vessels demonstrate normal patency. The left subclavian artery is moderately tortuous. Heart: Overall heart size appears normal. The left ventricular apical myocardium appears thin and pointed in appearance which may relate to prior myocardial infarction. No pericardial fluid is identified. Pulmonary Arteries: Central pulmonary arteries are normal in caliber. NON-VASCULAR Visualized mediastinum and hilar regions demonstrate no lymphadenopathy or masses. Visualized lung fields show no masses, consolidation or pleural fluid. Visualized upper abdominal structures appear unremarkable. No visualized bony abnormalities. IMPRESSION: 1. No evidence of thoracic aortic aneurysm, dissection or intramural hemorrhage. 2. The left ventricular apical myocardium appears thin and pointed in appearance which may relate to prior myocardial infarction. Electronically Signed   By: Aletta Edouard M.D.   On: 03/18/2020 16:35   ECHOCARDIOGRAM COMPLETE  Result Date: 03/18/2020    ECHOCARDIOGRAM REPORT   Patient Name:   DEMEKIA REZEK Date of Exam: 03/18/2020 Medical Rec #:  [id number]    Height:       61.0 in Accession #:    [id number]   Weight:       161.0 lb Date of Birth:  1933-02-21   BSA:          1.722 m Patient Age:    64 years     BP:           185/94 mmHg Patient Gender: F            HR:           96 bpm. Exam Location:  Inpatient Procedure: 2D Echo, Cardiac Doppler and  Color Doppler Indications:    R07.9* Chest pain, unspecified. Possible aortic dissection.  History:        Patient has prior history of Echocardiogram examinations, most                 recent 09/14/2018. Previous Myocardial Infarction, Prior CABG,                 Signs/Symptoms:Chest Pain and Dyspnea; Risk Factors:Diabetes.  Sonographer:    Roseanna Rainbow RDCS Referring Phys: 24 [place] Comments: Technically difficult study due to poor echo windows. IMPRESSIONS  1. Left ventricular ejection fraction, by estimation, is 70 to 75%. The left ventricle has hyperdynamic function. The left ventricle has no regional wall motion abnormalities. There is mild concentric  left ventricular hypertrophy. Left ventricular diastolic parameters are consistent with Grade I diastolic dysfunction (impaired relaxation).  2. Right ventricular systolic function is normal. The right ventricular size is normal. There is normal pulmonary artery systolic pressure.  3. Left atrial size was mildly dilated.  4. The mitral valve is normal in structure. No evidence of mitral valve regurgitation.  5. The aortic valve is tricuspid. Aortic valve regurgitation is not visualized. Mild aortic valve sclerosis is present, with no evidence of aortic valve stenosis.  6. The inferior vena cava is normal in size with greater than 50% respiratory variability, suggesting right atrial pressure of 3 mmHg. Comparison(s): No significant change from prior study. Prior images reviewed side by side. FINDINGS  Left Ventricle: Left ventricular ejection fraction, by estimation, is 70 to 75%. The left ventricle has hyperdynamic function. The left ventricle has no regional wall motion abnormalities. The left ventricular internal cavity size was normal in size. There is mild concentric left ventricular hypertrophy. Left ventricular diastolic parameters are consistent with Grade I diastolic dysfunction (impaired relaxation). Normal left ventricular filling  pressure. Right Ventricle: The right ventricular size is normal. No increase in right ventricular wall thickness. Right ventricular systolic function is normal. There is normal pulmonary artery systolic pressure. The tricuspid regurgitant velocity is 2.19 m/s, and  with an assumed right atrial pressure of 3 mmHg, the estimated right ventricular systolic pressure is [id number]. Left Atrium: Left atrial size was mildly dilated. Right Atrium: Right atrial size was normal in size. Pericardium: There is no evidence of pericardial effusion. Presence of pericardial fat pad. Mitral Valve: The mitral valve is normal in structure. No evidence of mitral valve regurgitation. Tricuspid Valve: The tricuspid valve is normal in structure. Tricuspid valve regurgitation is mild. Aortic Valve: The aortic valve is tricuspid. Aortic valve regurgitation is not visualized. Mild aortic valve sclerosis is present, with no evidence of aortic valve stenosis. Pulmonic Valve: The pulmonic valve was grossly normal. Pulmonic valve regurgitation is not visualized. Aorta: A prominent calcified plaque is seen at the right coronary ostium. No evidence for dissection in visualized portions of the aorta. The aortic root and ascending aorta are structurally normal, with no evidence of dilitation. Venous: The inferior vena cava is normal in size with greater than 50% respiratory variability, suggesting right atrial pressure of 3 mmHg. IAS/Shunts: No atrial level shunt detected by color flow Doppler.  LEFT VENTRICLE PLAX 2D LVIDd:         2.90 cm     Diastology LVIDs:         1.96 cm     LV e' lateral:   7.07 cm/s LV PW:         1.17 cm     LV E/e' lateral: 10.3 LV IVS:        1.59 cm     LV e' medial:    6.42 cm/s LVOT diam:     1.90 cm     LV E/e' medial:  11.3 LV SV:         59 LV SV Index:   34 LVOT Area:     2.84 cm  LV Volumes (MOD) LV vol d, MOD A4C: 53.6 ml LV vol s, MOD A4C: 11.5 ml LV SV MOD A4C:     53.6 ml RIGHT VENTRICLE             IVC RV S  prime:     11.00 cm/s  IVC diam: 1.16 cm TAPSE (M-mode): 1.2 cm LEFT  ATRIUM           Index       RIGHT ATRIUM           Index LA diam:      3.40 cm 1.97 cm/m  RA Area:     10.10 cm LA Vol (A2C): 24.7 ml 14.34 ml/m RA Volume:   18.90 ml  10.97 ml/m LA Vol (A4C): 41.6 ml 24.16 ml/m  AORTIC VALVE LVOT Vmax:   129.00 cm/s LVOT Vmean:  81.300 cm/s LVOT VTI:    0.208 m  AORTA Ao Root diam: 3.20 cm Ao Asc diam:  3.40 cm Ao Desc diam: 2.40 cm MITRAL VALVE               TRICUSPID VALVE MV Area (PHT): 6.96 cm    TR Peak grad:   19.2 mmHg MV Decel Time: 109 msec    TR Vmax:        219.00 cm/s MV E velocity: 72.55 cm/s MV A velocity: 95.60 cm/s  SHUNTS MV E/A ratio:  0.76        Systemic VTI:  0.21 m                            Systemic Diam: 1.90 cm Dani Gobble Croitoru MD Electronically signed by Sanda Klein MD Signature Date/Time: 03/18/2020/5:21:36 PM    Final     EKG: Independently reviewed.  NSR with rate 86; nonspecific ST changes with no evidence of acute ischemia; NSCSLT   Labs on Admission: I have personally reviewed the available labs and imaging studies at the time of the admission.  Pertinent labs:   Glucose 175 HS troponin 11 Unremarkable CBC UA: trace LE, rare bacteria   Assessment/Plan Principal Problem:   Chest pain Active Problems:   Dyslipidemia, goal LDL below 70   Accelerated hypertension with diastolic congestive heart failure, NYHA class 3 (HCC)   Diabetes mellitus (HCC)   Unilateral primary osteoarthritis, left hip   Chest pain with h/o CAD -Patient with h/o remote CABG presenting with chronic substernal chest pressure that started this AM and did not improve with NTG -At the time of my evaluation, she was quite anxious and appeared to be saying her goodbyes to family -She reported severe chest pain with radiation into the upper back and shoulders which was concerning for dissection -I initially ordered CTA to r/o dissection but patient has a severe dye allergy and the  pre-medication protocol would cause a 13 hour delay -After discussion with Dr. Burt Knack, I ordered MRA to r/o dissection and this was thankfully negative dissection but did show "The left ventricular apical myocardium appears thin and pointed in appearance which may relate to prior myocardial infarction" -Echo was also performed and showed a hyperdynamic myocardium and grade 1 diastolic dysfunction without apparent WMA -CXR unremarkable.   -HS troponin negative x 2 -EKG not indicative of acute ischemia.   -Will plan to place in observation status in progressive care given ongoing chest pain to rule out ACS by overnight observation.  -Repeat EKG in AM -Start ASA 81 mg daily, continue Plavix -Continue Imdur -Risk factor stratification with HgbA1c and FLP; will also check TSH -Cardiology consultation -NPO after midnight for possible ischemic testing  -Of note, the nurse just called to report that she was given NTG x 1 with marked hypotension which is slowly improving - will give IVF bolus of 250 cc x 1 now and hold further NTG (  it isn't helping anyway) -Continue morphine as needed for pain, and also has Ativan order which might be helpful -Dr. Burt Knack has started IV heparin given negative dissection study  HTN -Accelerated HTN, may be related to current presentation -She received Coreg, Hydralazine in the ER -Continue home Coreg -Renal imaging may be reasonable   PVD -She does c/o tingling in her feet and R groin pain -Prior ABIs in 06/2019 with moderate RLE arterial disease and noncompressible LLE arteries -This may also require evaluation while inpatient; she was scheduled for outpatient arterial testing tomorrow  HLD -Continue Crestor -Check lipids in AM  DM -Will check A1c; prior was 6.6 in 09/2018 -hold Amaryl -Cover with moderate-scale SSI  Chronic diastolic CHF -Echo today shows grade 1 diastolic dysfunction with preserved EF -will need to monitor for volume overload but  appears euvolemic at this time  L hip arthritis -Patient is planning for THR and is undergoing cardiac pre-op clearance - this was the reason for her upcoming MPS -Will defer clearance to cardiology -Continue Norco and Robaxin as needed for pain  RA -Hold methotrexate for now -Continue prednisone; she received a dose of hydrocortisone in the ER but she does not appear to need ongoing stress-dosed steroids at this time -Continue sulfasalazine (no other obvious indication but this is not a typical indication for this medication)   Note: This patient has been tested and is negative for the novel coronavirus COVID-19.    DVT prophylaxis: Heparin Code Status:  Full - confirmed with patient Family Communication: Daughter was present throughout evaluation Disposition Plan:  She is anticipated to d/c to home without [facility] services once her cardiology issues have been resolved. Consults called: Cardiology  Admission status: It is my clinical opinion that referral for OBSERVATION is reasonable and necessary in this patient based on the above information provided. The aforementioned taken together are felt to place the patient at high risk for further clinical deterioration. However it is anticipated that the patient may be medically stable for discharge from the hospital within 24 to 48 hours.    Karmen Bongo MD [facility]   How to contact the [facility] Attending or Consulting provider [place] or covering provider during after hours [place], for this patient?  1. Check the care team in [facility] and look for a) attending/consulting TR[facility] provider listed and b) the [facility] team listed 2. Log into [email or web address] and use [place]'s universal password to access. If you do not have the password, please contact the hospital operator. 3. Locate the [facility] provider you are looking for under [facility] and page to a number that you can be directly reached. 4. If you still have difficulty reaching the  provider, please page the [facility] (Director on Call) for the Hospitalists listed on amion for assistance.   03/18/2020, 5:29 PM

## 2020-03-19 ENCOUNTER — Observation Stay (HOSPITAL_BASED_OUTPATIENT_CLINIC_OR_DEPARTMENT_OTHER): Payer: Medicare Other

## 2020-03-19 ENCOUNTER — Ambulatory Visit (HOSPITAL_BASED_OUTPATIENT_CLINIC_OR_DEPARTMENT_OTHER): Payer: Medicare Other

## 2020-03-19 ENCOUNTER — Other Ambulatory Visit (HOSPITAL_COMMUNITY): Payer: Medicare Other

## 2020-03-19 DIAGNOSIS — R7989 Other specified abnormal findings of blood chemistry: Secondary | ICD-10-CM | POA: Diagnosis not present

## 2020-03-19 DIAGNOSIS — R0789 Other chest pain: Secondary | ICD-10-CM | POA: Diagnosis not present

## 2020-03-19 DIAGNOSIS — Z20822 Contact with and (suspected) exposure to covid-19: Secondary | ICD-10-CM | POA: Diagnosis not present

## 2020-03-19 DIAGNOSIS — Z7984 Long term (current) use of oral hypoglycemic drugs: Secondary | ICD-10-CM | POA: Diagnosis not present

## 2020-03-19 DIAGNOSIS — E119 Type 2 diabetes mellitus without complications: Secondary | ICD-10-CM | POA: Diagnosis not present

## 2020-03-19 DIAGNOSIS — I5032 Chronic diastolic (congestive) heart failure: Secondary | ICD-10-CM | POA: Diagnosis not present

## 2020-03-19 DIAGNOSIS — Z79899 Other long term (current) drug therapy: Secondary | ICD-10-CM | POA: Diagnosis not present

## 2020-03-19 DIAGNOSIS — R079 Chest pain, unspecified: Secondary | ICD-10-CM | POA: Diagnosis not present

## 2020-03-19 DIAGNOSIS — R072 Precordial pain: Secondary | ICD-10-CM | POA: Diagnosis not present

## 2020-03-19 DIAGNOSIS — Z951 Presence of aortocoronary bypass graft: Secondary | ICD-10-CM | POA: Diagnosis not present

## 2020-03-19 DIAGNOSIS — I252 Old myocardial infarction: Secondary | ICD-10-CM | POA: Diagnosis not present

## 2020-03-19 DIAGNOSIS — I2510 Atherosclerotic heart disease of native coronary artery without angina pectoris: Secondary | ICD-10-CM | POA: Diagnosis not present

## 2020-03-19 DIAGNOSIS — I110 Hypertensive heart disease with heart failure: Secondary | ICD-10-CM | POA: Diagnosis not present

## 2020-03-19 DIAGNOSIS — E782 Mixed hyperlipidemia: Secondary | ICD-10-CM | POA: Diagnosis not present

## 2020-03-19 DIAGNOSIS — M1612 Unilateral primary osteoarthritis, left hip: Secondary | ICD-10-CM | POA: Diagnosis not present

## 2020-03-19 DIAGNOSIS — I160 Hypertensive urgency: Secondary | ICD-10-CM | POA: Diagnosis not present

## 2020-03-19 LAB — URINE CULTURE

## 2020-03-19 LAB — CBC
HCT: 37.9 % (ref 36.0–46.0)
Hemoglobin: 11.7 g/dL — ABNORMAL LOW (ref 12.0–15.0)
MCH: 28.2 pg (ref 26.0–34.0)
MCHC: 30.9 g/dL (ref 30.0–36.0)
MCV: 91.3 fL (ref 80.0–100.0)
Platelets: 240 10*3/uL (ref 150–400)
RBC: 4.15 MIL/uL (ref 3.87–5.11)
RDW: 14 % (ref 11.5–15.5)
WBC: 5 10*3/uL (ref 4.0–10.5)
nRBC: 0 % (ref 0.0–0.2)

## 2020-03-19 LAB — CBC WITH DIFFERENTIAL/PLATELET
Abs Immature Granulocytes: 0.03 10*3/uL (ref 0.00–0.07)
Basophils Absolute: 0 10*3/uL (ref 0.0–0.1)
Basophils Relative: 1 %
Eosinophils Absolute: 0.1 10*3/uL (ref 0.0–0.5)
Eosinophils Relative: 1 %
HCT: 39.7 % (ref 36.0–46.0)
Hemoglobin: 12.5 g/dL (ref 12.0–15.0)
Immature Granulocytes: 0 %
Lymphocytes Relative: 28 %
Lymphs Abs: 1.9 10*3/uL (ref 0.7–4.0)
MCH: 28.5 pg (ref 26.0–34.0)
MCHC: 31.5 g/dL (ref 30.0–36.0)
MCV: 90.6 fL (ref 80.0–100.0)
Monocytes Absolute: 0.9 10*3/uL (ref 0.1–1.0)
Monocytes Relative: 13 %
Neutro Abs: 3.9 10*3/uL (ref 1.7–7.7)
Neutrophils Relative %: 57 %
Platelets: 241 10*3/uL (ref 150–400)
RBC: 4.38 MIL/uL (ref 3.87–5.11)
RDW: 14 % (ref 11.5–15.5)
WBC: 6.9 10*3/uL (ref 4.0–10.5)
nRBC: 0 % (ref 0.0–0.2)

## 2020-03-19 LAB — NM MYOCAR MULTI W/SPECT W/WALL MOTION / EF
Estimated workload: 1 METS
Exercise duration (min): 0 min
Exercise duration (sec): 0 s
MPHR: 136 {beats}/min
Peak HR: 111 {beats}/min
Percent HR: 81 %
Rest HR: 59 {beats}/min

## 2020-03-19 LAB — ABO/RH: ABO/RH(D): A POS

## 2020-03-19 LAB — BASIC METABOLIC PANEL
Anion gap: 11 (ref 5–15)
BUN: 15 mg/dL (ref 8–23)
CO2: 25 mmol/L (ref 22–32)
Calcium: 8.9 mg/dL (ref 8.9–10.3)
Chloride: 100 mmol/L (ref 98–111)
Creatinine, Ser: 1.12 mg/dL — ABNORMAL HIGH (ref 0.44–1.00)
GFR calc Af Amer: 52 mL/min — ABNORMAL LOW (ref >60)
GFR calc non Af Amer: 45 mL/min — ABNORMAL LOW (ref >60)
Glucose, Bld: 258 mg/dL — ABNORMAL HIGH (ref 70–99)
Potassium: 4.1 mmol/L (ref 3.5–5.1)
Sodium: 136 mmol/L (ref 135–145)

## 2020-03-19 LAB — GLUCOSE, CAPILLARY
Glucose-Capillary: 121 mg/dL — ABNORMAL HIGH (ref 70–99)
Glucose-Capillary: 184 mg/dL — ABNORMAL HIGH (ref 70–99)

## 2020-03-19 LAB — HEPARIN LEVEL (UNFRACTIONATED)
Heparin Unfractionated: 0.45 IU/mL (ref 0.30–0.70)
Heparin Unfractionated: 0.82 IU/mL — ABNORMAL HIGH (ref 0.30–0.70)

## 2020-03-19 LAB — TYPE AND SCREEN
ABO/RH(D): A POS
Antibody Screen: NEGATIVE

## 2020-03-19 LAB — LIPID PANEL
Cholesterol: 177 mg/dL (ref 0–200)
HDL: 51 mg/dL (ref >40)
LDL Cholesterol: 119 mg/dL — ABNORMAL HIGH (ref 0–99)
Total CHOL/HDL Ratio: 3.5 RATIO
Triglycerides: 35 mg/dL (ref <150)
VLDL: 7 mg/dL (ref 0–40)

## 2020-03-19 LAB — HEMOGLOBIN A1C
Hgb A1c MFr Bld: 7.7 % — ABNORMAL HIGH (ref 4.8–5.6)
Mean Plasma Glucose: 174 mg/dL

## 2020-03-19 LAB — TROPONIN I (HIGH SENSITIVITY): Troponin I (High Sensitivity): 12 ng/L (ref <18)

## 2020-03-19 LAB — RETICULOCYTES
Immature Retic Fract: 19.2 % — ABNORMAL HIGH (ref 2.3–15.9)
RBC.: 4.38 MIL/uL (ref 3.87–5.11)
Retic Count, Absolute: 72.3 10*3/uL (ref 19.0–186.0)
Retic Ct Pct: 1.7 % (ref 0.4–3.1)

## 2020-03-19 LAB — C-REACTIVE PROTEIN: CRP: 0.9 mg/dL (ref <1.0)

## 2020-03-19 LAB — PROTIME-INR
INR: 1.1 (ref 0.8–1.2)
Prothrombin Time: 14.4 seconds (ref 11.4–15.2)

## 2020-03-19 MED ORDER — ISOSORBIDE MONONITRATE ER 60 MG PO TB24
60.0000 mg | ORAL_TABLET | Freq: Every day | ORAL | Status: DC
  Administered 2020-03-19: 60 mg via ORAL
  Filled 2020-03-19: qty 1

## 2020-03-19 MED ORDER — NITROGLYCERIN 0.4 MG SL SUBL: 0.4000 mg | SUBLINGUAL_TABLET | Freq: Once | SUBLINGUAL | Status: AC

## 2020-03-19 MED ORDER — PANTOPRAZOLE SODIUM 40 MG PO TBEC: 40.0000 mg | DELAYED_RELEASE_TABLET | Freq: Two times a day (BID) | ORAL | 1 refills | Status: DC

## 2020-03-19 MED ORDER — HYDRALAZINE HCL 20 MG/ML IJ SOLN: 10.0000 mg | Freq: Once | INTRAMUSCULAR | Status: AC

## 2020-03-19 MED ORDER — NITROGLYCERIN 0.4 MG SL SUBL
SUBLINGUAL_TABLET | SUBLINGUAL | Status: AC
  Administered 2020-03-19: 0.4 mg via SUBLINGUAL
  Filled 2020-03-19: qty 1

## 2020-03-19 MED ORDER — TECHNETIUM TC 99M TETROFOSMIN IV KIT
10.0000 | PACK | Freq: Once | INTRAVENOUS | Status: AC | PRN
  Administered 2020-03-19: 10 via INTRAVENOUS

## 2020-03-19 MED ORDER — REGADENOSON 0.4 MG/5ML IV SOLN
0.4000 mg | Freq: Once | INTRAVENOUS | Status: AC
  Administered 2020-03-19: 0.4 mg via INTRAVENOUS

## 2020-03-19 MED ORDER — PANTOPRAZOLE SODIUM 40 MG PO TBEC
40.0000 mg | DELAYED_RELEASE_TABLET | Freq: Two times a day (BID) | ORAL | Status: DC
  Administered 2020-03-19: 40 mg via ORAL
  Filled 2020-03-19: qty 1

## 2020-03-19 MED ORDER — INSULIN ASPART 100 UNIT/ML ~~LOC~~ SOLN: 0.0000 [IU] | Freq: Every day | SUBCUTANEOUS | Status: DC

## 2020-03-19 MED ORDER — ALUM & MAG HYDROXIDE-SIMETH 200-200-20 MG/5ML PO SUSP
30.0000 mL | Freq: Once | ORAL | Status: AC
  Administered 2020-03-19: 30 mL via ORAL
  Filled 2020-03-19: qty 30

## 2020-03-19 MED ORDER — INSULIN ASPART 100 UNIT/ML ~~LOC~~ SOLN: 0.0000 [IU] | Freq: Three times a day (TID) | SUBCUTANEOUS | Status: DC

## 2020-03-19 MED ORDER — INSULIN ASPART 100 UNIT/ML ~~LOC~~ SOLN
0.0000 [IU] | SUBCUTANEOUS | Status: DC
  Administered 2020-03-19: 2 [IU] via SUBCUTANEOUS
  Administered 2020-03-19: 1 [IU] via SUBCUTANEOUS

## 2020-03-19 MED ORDER — REGADENOSON 0.4 MG/5ML IV SOLN
INTRAVENOUS | Status: AC
  Filled 2020-03-19: qty 5

## 2020-03-19 MED ORDER — TECHNETIUM TC 99M TETROFOSMIN IV KIT
30.0000 | PACK | Freq: Once | INTRAVENOUS | Status: AC | PRN
  Administered 2020-03-19: 30 via INTRAVENOUS

## 2020-03-19 MED ORDER — HYDRALAZINE HCL 20 MG/ML IJ SOLN
INTRAMUSCULAR | Status: AC
  Administered 2020-03-19: 10 mg via INTRAVENOUS
  Filled 2020-03-19: qty 1

## 2020-03-19 MED ORDER — METHOCARBAMOL 500 MG PO TABS: 500.0000 mg | ORAL_TABLET | Freq: Three times a day (TID) | ORAL | 0 refills | Status: DC | PRN

## 2020-03-19 NOTE — Progress Notes (Addendum)
Patient experienced episode of chest pain after nuclear stress test. Patient states pain is severe and heavy. Sande Rives, PA at bedside and order given for sublingual nitroglycerin and ekg. Patient on bedside cardiac monitor, sinus rhythm 100. Patient also placed on 2L [place].   After nitroglycerin given patient states pain is now mild and better. Patient also has frequent episodes of burping. She states this tends to happen whenever she takes nitroglycerin. No further interventions ordered. BP 118/63, NSR, 100% O2 sat. Patient primary nurse made aware.

## 2020-03-19 NOTE — Progress Notes (Signed)
Bilateral lower extremity venous duplex has been completed. Preliminary results can be found in CV Proc through chart review.   03/19/20 3:03 PM [name] RVT

## 2020-03-19 NOTE — Progress Notes (Signed)
[place] for Heparin Indication: chest pain/ACS  Allergies  Allergen Reactions  . Fish Allergy Hives and Shortness Of Breath  . Ivp Dye [Iodinated Diagnostic Agents] Shortness Of Breath and Anaphylaxis  . Latex Anaphylaxis, Hives, Shortness Of Breath, Itching and Other (See Comments)    REACTION: wheezing  . Shellfish Allergy Anaphylaxis, Hives, Shortness Of Breath and Other (See Comments)    All seafood  . Shellfish-Derived Products Anaphylaxis and Hives  . Other Itching and Other (See Comments)    Patient reports allergy to perfumed detergents. Causes itching.  . Metformin Nausea Only and Nausea And Vomiting    Patient Measurements: Height: 5\' 1"  (154.9 cm) Weight: 70.3 kg (155 lb) IBW/kg (Calculated) : 47.8 Heparin Dosing Weight: 63.7 kg  Vital Signs: Temp: 97.2 F (36.2 C) (04/06 2305) Temp Source: Oral (04/06 2305) BP: 123/61 (04/06 2305) Pulse Rate: 70 (04/06 2305)  Labs: Recent Labs    03/18/20 1144 03/18/20 1246 03/19/20 0029  HGB 14.0  --  11.7*  HCT 46.2*  --  37.9  PLT 214  --  240  HEPARINUNFRC  --   --  0.82*  CREATININE 0.93  --   --   TROPONINIHS 11 11  --     Estimated Creatinine Clearance: 40.4 mL/min (by C-G formula based on SCr of 0.93 mg/dL).  Assessment: Patient is a 66 yof that is being admitted for severe chest pain. The CP did respond to Ntg however on last administration the patient became profoundly hypotensive. The patient was r/o for a dissection, and with continued CP, pharmacy has been asked to dose heparin at this time.   Heparin level supratherapeutic (0.82) on gtt at 750 units/hr. No bleeding noted.  Goal of Therapy:  Heparin level 0.3-0.7 units/ml Monitor platelets by anticoagulation protocol: Yes   Plan:  - Decrease Heparin drip to 650 units/hr - Heparin level in 8 hours   Sherlon Handing, PharmD, BCPS Please see amion for complete clinical pharmacist phone list 03/19/2020,1:03 AM

## 2020-03-19 NOTE — Progress Notes (Addendum)
Progress Note  Patient Name: [name] Date of Encounter: 03/19/2020  Primary Cardiologist: [name], MD   Subjective   Seen down in nuclear medicine. Chest pain much improved from yesterday. Describes mild chest heaviness this morning. No significant shortness of breath. Main complaint is pain in right groin area.   Had worsening chest pain after nuclear stress test that she ranked as a 4/10 but still not as severe as yesterday. Improved with sublingual Nitro.   Inpatient Medications    Scheduled Meds: . aspirin EC  81 mg Oral Daily  . carvedilol  3.125 mg Oral BID  . clopidogrel  75 mg Oral Daily  . folic acid  1 mg Oral Daily  . gabapentin  300 mg Oral BID  . insulin aspart  0-5 Units Subcutaneous QHS  . insulin aspart  0-9 Units Subcutaneous Q4H  . nitroGLYCERIN      . pantoprazole  40 mg Oral Daily  . polyvinyl alcohol  1 drop Both Eyes QHS  . predniSONE  5 mg Oral Q breakfast  . regadenoson      . rosuvastatin  20 mg Oral QHS  . sodium chloride flush  3 mL Intravenous Q12H  . sulfaSALAzine  1,000 mg Oral Daily   Continuous Infusions: . sodium chloride    . sodium chloride    . heparin 650 Units/hr (03/19/20 0159)   PRN Meds: sodium chloride, acetaminophen, HYDROcodone-acetaminophen, LORazepam, methocarbamol, morphine injection, ondansetron (ZOFRAN) IV, sodium chloride flush, traMADol   Vital Signs    Vitals:   03/19/20 1016 03/19/20 1019 03/19/20 1021 03/19/20 1023  BP: (!) 183/78 (!) 146/67 (!) 142/66 (!) 154/73  Pulse:      Resp:      Temp:      TempSrc:      SpO2:      Weight:      Height:        Intake/Output Summary (Last 24 hours) at 03/19/2020 1043 Last data filed at 03/19/2020 0713 Gross per 24 hour  Intake 240 ml  Output 400 ml  Net -160 ml   Last 3 Weights 03/19/2020 03/18/2020 03/18/2020  Weight (lbs) 154 lb 1.6 oz 155 lb 161 lb  Weight (kg) 69.9 kg 70.308 kg 73.029 kg      Telemetry    Unable to review down in Nuclear Medicine but  in sinus rhythm during stress test.  - Personally Reviewed  ECG    Normal sinus rhythm, rate 90 bpm, with some underlying artifact but clear t wave inversion in V2 and biphasic T wave in leads V3-V6. Also has some mild ST depression in lead II and V3-V6.   - Personally Reviewed  Physical Exam   GEN: No acute distress.   Neck: Supple.  Cardiac: RRR. No murmurs, rubs, or gallops.  Respiratory: Clear to auscultation bilaterally. No wheezes, rhonchi, or rales.  GI: Soft and non-distended. Some tenderness to palpation in right lower abdomen/groin area. Bowel sounds present.  MS: Mild lower extremity edema. No deformity. Skin: Warm and dry. Neuro:  No focal deficits.  Psych: Normal affect. Responds appropriately.   Labs    High Sensitivity Troponin:   Recent Labs  Lab 03/18/20 1144 03/18/20 1246 03/19/20 0619  TROPONINIHS 11 11 12       Chemistry Recent Labs  Lab 03/18/20 1144 03/19/20 0029  NA 140 136  K 4.0 4.1  CL 99 100  CO2 28 25  GLUCOSE 175* 258*  BUN 11 15  CREATININE 0.93  1.12*  CALCIUM 10.0 8.9  GFRNONAA 56* 45*  GFRAA >60 52*  ANIONGAP 13 11     Hematology Recent Labs  Lab 03/18/20 1144 03/19/20 0029  WBC 5.4 5.0  RBC 5.05 4.15  HGB 14.0 11.7*  HCT 46.2* 37.9  MCV 91.5 91.3  MCH 27.7 28.2  MCHC 30.3 30.9  RDW 14.0 14.0  PLT 214 240    BNPNo results for input(s): BNP, PROBNP in the last 168 hours.   DDimer  Recent Labs  Lab 03/18/20 1800  DDIMER 19.65*     Radiology    DG Chest 2 View  Result Date: 03/18/2020 CLINICAL DATA:  Chest pain EXAM: CHEST - 2 VIEW COMPARISON:  11/17/2019 FINDINGS: Hypoventilation with decreased lung volume. Right lower lobe atelectasis and elevated right hemidiaphragm unchanged. Postop CABG.  Negative for heart failure or effusion. IMPRESSION: Hypoventilation with decreased lung volume and right lower lobe atelectasis. No acute abnormality Electronically Signed   By: Franchot Gallo M.D.   On: 03/18/2020 10:17    MR ANGIO CHEST W WO CONTRAST  Result Date: 03/18/2020 CLINICAL DATA:  Chest pain. History of coronary artery disease. Allergy to iodinated contrast material. EXAM: MRA CHEST WITH OR WITHOUT CONTRAST TECHNIQUE: Angiographic images of the chest were obtained using MRA technique with intravenous contrast. CONTRAST:  38mL GADAVIST GADOBUTROL 1 MMOL/ML IV SOLN COMPARISON:  CT of the chest without contrast on 11/17/2019 FINDINGS: VASCULAR Aorta: The thoracic aorta is of normal caliber. There is no evidence of aneurysmal disease, dissection or intramural hemorrhage. Visualized proximal great vessels demonstrate normal patency. The left subclavian artery is moderately tortuous. Heart: Overall heart size appears normal. The left ventricular apical myocardium appears thin and pointed in appearance which may relate to prior myocardial infarction. No pericardial fluid is identified. Pulmonary Arteries: Central pulmonary arteries are normal in caliber. NON-VASCULAR Visualized mediastinum and hilar regions demonstrate no lymphadenopathy or masses. Visualized lung fields show no masses, consolidation or pleural fluid. Visualized upper abdominal structures appear unremarkable. No visualized bony abnormalities. IMPRESSION: 1. No evidence of thoracic aortic aneurysm, dissection or intramural hemorrhage. 2. The left ventricular apical myocardium appears thin and pointed in appearance which may relate to prior myocardial infarction. Electronically Signed   By: Aletta Edouard M.D.   On: 03/18/2020 16:35   ECHOCARDIOGRAM COMPLETE  Result Date: 03/18/2020    ECHOCARDIOGRAM REPORT   Patient Name:   [name] Date of Exam: 03/18/2020 Medical Rec #:  [id number]    Height:       61.0 in Accession #:    [id number]   Weight:       161.0 lb Date of Birth:  1933/04/17   BSA:          1.722 m Patient Age:    86 years     BP:           185/94 mmHg Patient Gender: F            HR:           96 bpm. Exam Location:  Inpatient Procedure: 2D  Echo, Cardiac Doppler and Color Doppler Indications:    R07.9* Chest pain, unspecified. Possible aortic dissection.  History:        Patient has prior history of Echocardiogram examinations, most                 recent 09/14/2018. Previous Myocardial Infarction, Prior CABG,  Signs/Symptoms:Chest Pain and Dyspnea; Risk Factors:Diabetes.  Sonographer:    Roseanna Rainbow RDCS Referring Phys: 86 [place] Comments: Technically difficult study due to poor echo windows. IMPRESSIONS  1. Left ventricular ejection fraction, by estimation, is 70 to 75%. The left ventricle has hyperdynamic function. The left ventricle has no regional wall motion abnormalities. There is mild concentric left ventricular hypertrophy. Left ventricular diastolic parameters are consistent with Grade I diastolic dysfunction (impaired relaxation).  2. Right ventricular systolic function is normal. The right ventricular size is normal. There is normal pulmonary artery systolic pressure.  3. Left atrial size was mildly dilated.  4. The mitral valve is normal in structure. No evidence of mitral valve regurgitation.  5. The aortic valve is tricuspid. Aortic valve regurgitation is not visualized. Mild aortic valve sclerosis is present, with no evidence of aortic valve stenosis.  6. The inferior vena cava is normal in size with greater than 50% respiratory variability, suggesting right atrial pressure of 3 mmHg. Comparison(s): No significant change from prior study. Prior images reviewed side by side. FINDINGS  Left Ventricle: Left ventricular ejection fraction, by estimation, is 70 to 75%. The left ventricle has hyperdynamic function. The left ventricle has no regional wall motion abnormalities. The left ventricular internal cavity size was normal in size. There is mild concentric left ventricular hypertrophy. Left ventricular diastolic parameters are consistent with Grade I diastolic dysfunction (impaired relaxation). Normal left  ventricular filling pressure. Right Ventricle: The right ventricular size is normal. No increase in right ventricular wall thickness. Right ventricular systolic function is normal. There is normal pulmonary artery systolic pressure. The tricuspid regurgitant velocity is 2.19 m/s, and  with an assumed right atrial pressure of 3 mmHg, the estimated right ventricular systolic pressure is [id number]. Left Atrium: Left atrial size was mildly dilated. Right Atrium: Right atrial size was normal in size. Pericardium: There is no evidence of pericardial effusion. Presence of pericardial fat pad. Mitral Valve: The mitral valve is normal in structure. No evidence of mitral valve regurgitation. Tricuspid Valve: The tricuspid valve is normal in structure. Tricuspid valve regurgitation is mild. Aortic Valve: The aortic valve is tricuspid. Aortic valve regurgitation is not visualized. Mild aortic valve sclerosis is present, with no evidence of aortic valve stenosis. Pulmonic Valve: The pulmonic valve was grossly normal. Pulmonic valve regurgitation is not visualized. Aorta: A prominent calcified plaque is seen at the right coronary ostium. No evidence for dissection in visualized portions of the aorta. The aortic root and ascending aorta are structurally normal, with no evidence of dilitation. Venous: The inferior vena cava is normal in size with greater than 50% respiratory variability, suggesting right atrial pressure of 3 mmHg. IAS/Shunts: No atrial level shunt detected by color flow Doppler.  LEFT VENTRICLE PLAX 2D LVIDd:         2.90 cm     Diastology LVIDs:         1.96 cm     LV e' lateral:   7.07 cm/s LV PW:         1.17 cm     LV E/e' lateral: 10.3 LV IVS:        1.59 cm     LV e' medial:    6.42 cm/s LVOT diam:     1.90 cm     LV E/e' medial:  11.3 LV SV:         59 LV SV Index:   34 LVOT Area:     2.84 cm  LV Volumes (MOD) LV vol d, MOD A4C: 53.6 ml LV vol s, MOD A4C: 11.5 ml LV SV MOD A4C:     53.6 ml RIGHT VENTRICLE              IVC RV S prime:     11.00 cm/s  IVC diam: 1.16 cm TAPSE (M-mode): 1.2 cm LEFT ATRIUM           Index       RIGHT ATRIUM           Index LA diam:      3.40 cm 1.97 cm/m  RA Area:     10.10 cm LA Vol (A2C): 24.7 ml 14.34 ml/m RA Volume:   18.90 ml  10.97 ml/m LA Vol (A4C): 41.6 ml 24.16 ml/m  AORTIC VALVE LVOT Vmax:   129.00 cm/s LVOT Vmean:  81.300 cm/s LVOT VTI:    0.208 m  AORTA Ao Root diam: 3.20 cm Ao Asc diam:  3.40 cm Ao Desc diam: 2.40 cm MITRAL VALVE               TRICUSPID VALVE MV Area (PHT): 6.96 cm    TR Peak grad:   19.2 mmHg MV Decel Time: 109 msec    TR Vmax:        219.00 cm/s MV E velocity: 72.55 cm/s MV A velocity: 95.60 cm/s  SHUNTS MV E/A ratio:  0.76        Systemic VTI:  0.21 m                            Systemic Diam: 1.90 cm Sanda Klein MD Electronically signed by Sanda Klein MD Signature Date/Time: 03/18/2020/5:21:36 PM    Final     Cardiac Studies   Chest MRA 03/18/2020: Impression: 1. No evidence of thoracic aortic aneurysm, dissection or intramural hemorrhage. 2. The left ventricular apical myocardium appears thin and pointed in appearance which may relate to prior myocardial infarction. _______________  Echocardiogram 03/18/2020: Impressions: 1. Left ventricular ejection fraction, by estimation, is 70 to 75%. The  left ventricle has hyperdynamic function. The left ventricle has no  regional wall motion abnormalities. There is mild concentric left  ventricular hypertrophy. Left ventricular  diastolic parameters are consistent with Grade I diastolic dysfunction  (impaired relaxation).  2. Right ventricular systolic function is normal. The right ventricular  size is normal. There is normal pulmonary artery systolic pressure.  3. Left atrial size was mildly dilated.  4. The mitral valve is normal in structure. No evidence of mitral valve  regurgitation.  5. The aortic valve is tricuspid. Aortic valve regurgitation is not  visualized. Mild aortic  valve sclerosis is present, with no evidence of  aortic valve stenosis.  6. The inferior vena cava is normal in size with greater than 50%  respiratory variability, suggesting right atrial pressure of 3 mmHg.   Comparison(s): No significant change from prior study. Prior images  reviewed side by side.   Patient Profile     86 y.o. female CAD s/p PTCA of LAD in 1992 and  CABG x3 in 1998, PVD s/p remote LE interventions (followed by Dr. Gwenlyn Found), chronic diastolic CHF with chronic lower extremity edema (left > right), hypertension, dyslipidemia, chronic anemia, asymptomatic bradycardia, atrial septal aneurysm on echo, GERD, peptic ulcer disease, RA on chronic prednisone, and IV dye allergy who is being seen today for the evaluation of chest pain at the request of Dr.  Percell Miller .  Assessment & Plan    Chest Pain with - Patient presented with severe chest pain that radiated to back.  - EKG showed no acute findings.  - High-sensitivity troponin negative x3. - Given concern for aortic dissection, chest MRA was ordered and was negative. - Echo showed LVEF of 70-75% with normal wall motion and grade 1 diastolic dysfunction.  - Patient seen down in nuclear medicine today for stress test. She reports chest pain has greatly improved but still notes a little heaviness. Myoview results pending. - Of note, patient did develop worsening chest pain following stress test while down in nuclear medicine. Repeat 12-lead EKG performed and showed continued mild ST depression/biphasic T waves in leads V3-V6. Sublingual Nitro given with improvement of pain.  - Patient burping a lot so will also order another GI cocktail for when patient gets back up to the floor.   CAD - History of PTCA of LAD in 1992 and  CABG x3 in 1998. - Continue Aspirin, Plavix, Coreg, and Crestor.  - Home Imdur was discontinued. Unclear why. Recommend restarting.   Elevated D-Dimer - D-dimer markedly elevated at 19.65.  - Has anaphylaxis  allergy to contrast dye so MRA was ordered to rule out dissection and this was negative. Could consider V/Q scan after necessary waiting period after Myoview if there is still concern for PE at that time. - Lower extremity dopplers have also been ordered.   PAD - History of diamondback orbital rotational atherectomy of her distal right SFA and intervention on her peroneal artery using a coronary drug-eluting stent in 05/2013.  - Most recent dopplers in 06/2019 showed right ABI of 0.76 and a left of 1.36 - Notes paraesthesias in feet.  - Looks like Dr. Gwenlyn Found recently ordered repeat arterial dopplers to be performed tomorrow. Consider ordering this to be done as inpatient.  - She was also noted to have a soft right carotid artery bruit on admission so may need carotid ultrasounds as inpatient as well.   Chronic Diastolic CHF - Echo this admission showed LVEF of 70-75% with grade 1 diastolic dysfunction.  - Patient on PO Lasix for chronic edema. Does not appear significantly volume overloaded on exam.  - Continue to monitor volume status closely.    Hypertension - BP markedly elevated with BP as high as 203/88 today.  - Gave dose of IV Hydralazine 10mg  down in nuclear medicine with improvement. BP 154/73 at end of stress test. - Continue Coreg 3.125mg  twice daily.  - Home Imdur has been discontinued. Unclear why. Recommend restarting.   Hyperlipidemia - LDL this admission 119.  - Will increase home Crestor to 40mg  daily.   Otherwise, per primary team. - Type 2 diabetes - RA - Left hip arthritis - GERD   For questions or updates, please contact [facility] Please consult [email or web address] for contact info under     Signed, Darreld Mclean, PA-C  03/19/2020, 10:43 AM    Patient seen, examined. Available data reviewed. Agree with findings, assessment, and plan as outlined by Sande Rives, PA-C. The patient's Myoview is reviewed as below: IMPRESSION: 1. The SPECT acquisition is not  optimal due to some rotational differences in appearance of the left ventricle between stress and rest acquisitions. There is suggestion of probable infarcts involving the left ventricular apex, septum and lateral wall. There may be subtle inducible ischemia in the inferior wall.  2. Normal left ventricular wall motion.  3. Left ventricular ejection fraction 81%  4. Non  invasive risk stratification*: Intermediate  I reviewed her images with our imaging specialists as well. The patient's gated LVEF is 81% and there is no clear ischemia. I think her study is low risk.   On exam, the patient is comfortably resting in bed.  She is in no distress.  Lungs are clear, heart is regular rate and rhythm with no murmur gallop, abdomen is soft and nontender, extremities have no edema, skin is warm dry without rash.  The patient feels much better today.  Her chest pain has now resolved.  I think it is best to treat her medically for coronary artery disease in light of her normal troponins, advanced age, and Myoview scan without any large areas of ischemia.  Will stop IV heparin.  Her D-dimer was markedly elevated, but there is no suggestion of pulmonary embolus on chest MRA which opacified the proximal pulmonary arteries.  The patient's echocardiogram showed normal RV size and function with normal estimated pulmonary pressures.  Her clinical presentation is not consistent with acute pulmonary embolus as she is oxygenating well and not short of breath.  Would continue carvedilol, isosorbide, aspirin, and clopidogrel.  She has been on a high intensity statin drug with rosuvastatin 20 mg daily.  Otherwise continued care per the hospitalist team.  Will arrange outpatient cardiology follow-up.  Sherren Mocha, M.D. 03/19/2020 3:12 PM

## 2020-03-19 NOTE — Progress Notes (Signed)
[place] for Heparin Indication: chest pain/ACS  Allergies  Allergen Reactions  . Fish Allergy Hives and Shortness Of Breath  . Ivp Dye [Iodinated Diagnostic Agents] Shortness Of Breath and Anaphylaxis  . Latex Anaphylaxis, Hives, Shortness Of Breath, Itching and Other (See Comments)    REACTION: wheezing  . Shellfish Allergy Anaphylaxis, Hives, Shortness Of Breath and Other (See Comments)    All seafood  . Shellfish-Derived Products Anaphylaxis and Hives  . Other Itching and Other (See Comments)    Patient reports allergy to perfumed detergents. Causes itching.  . Metformin Nausea Only and Nausea And Vomiting    Patient Measurements: Height: 5\' 1"  (154.9 cm) Weight: 69.9 kg (154 lb 1.6 oz) IBW/kg (Calculated) : 47.8 Heparin Dosing Weight: 63.7 kg  Vital Signs: Temp: 98.6 F (37 C) (04/07 0657) Temp Source: Oral (04/07 0657) BP: 151/85 (04/07 1159) Pulse Rate: 74 (04/07 1159)  Labs: Recent Labs    03/18/20 1144 03/18/20 1246 03/19/20 0029 03/19/20 0619 03/19/20 1252  HGB 14.0  --  11.7*  --   --   HCT 46.2*  --  37.9  --   --   PLT 214  --  240  --   --   LABPROT  --   --  14.4  --   --   INR  --   --  1.1  --   --   HEPARINUNFRC  --   --  0.82*  --  0.45  CREATININE 0.93  --  1.12*  --   --   TROPONINIHS 11 11  --  12  --     Estimated Creatinine Clearance: 33.4 mL/min (A) (by C-G formula based on SCr of 1.12 mg/dL (H)).  Assessment: Patient is a 80 yof that is being admitted for severe chest pain. Patient underwent stress test today.  Pharmacy has been asked to dose heparin at this time.   Heparin level therapeutic (0.85) on gtt at 650 units/hr. No bleeding noted.  Goal of Therapy:  Heparin level 0.3-0.7 units/ml Monitor platelets by anticoagulation protocol: Yes   Plan:  Continue Heparin gtt at 650 units/hr Obtain confirmatory Heparin level in 8 hours  Monitor daily heparin levels, CBC, and s/sx of bleeding     Acey Lav, PharmD  PGY1 Acute Care Pharmacy Resident 03/19/2020,3:04 PM

## 2020-03-19 NOTE — Progress Notes (Signed)
   Patient presented for a nuclear stress test today. No immediate complications. Stress imaging is pending at this time.   Preliminary EKG findings may be listed in the chart, but the stress test result will not be finalized until perfusion imaging is complete.  Darreld Mclean, PA-C 03/19/2020 10:36 AM

## 2020-03-20 ENCOUNTER — Telehealth (HOSPITAL_COMMUNITY): Payer: Self-pay | Admitting: *Deleted

## 2020-03-20 ENCOUNTER — Ambulatory Visit (HOSPITAL_COMMUNITY): Payer: Medicare Other

## 2020-03-20 NOTE — Discharge Summary (Signed)
[facility] Discharge Summary   Patient: Marcayla Burkey Harrington [id number]  PCP: Jani Gravel, MD  Date of admission: 03/18/2020   Date of discharge: 03/19/2020      Discharge Diagnoses:  Principal Problem:   Chest pain Active Problems:   Dyslipidemia, goal LDL below 70   Accelerated hypertension with diastolic congestive heart failure, NYHA class 3 (HCC)   Diabetes mellitus ([place])   Unilateral primary osteoarthritis, left hip  Admitted From: home Disposition:  Home   Recommendations for Outpatient Follow-up:  1. PCP: please follow up with PCP in 1 week 2. Follow up LABS/TEST:  none  Follow-up Information    Jani Gravel, MD. Schedule an appointment as soon as possible for a visit in 1 week(s).   Specialty: Internal Medicine Contact information: [address] [place] [address] [phone]        [name], MD .   Specialty: Cardiology Contact information: [address] [place] [address] [phone]          Diet recommendation: Cardiac diet  Activity: The patient is advised to gradually reintroduce usual activities, as tolerated  Discharge Condition: stable  Code Status: Full code   History of present illness: As per the H and P dictated on admission, "ORTHA MERENESS is a 86 y.o. female with medical history significant of PAD; CAD s/p CABG; HLD; HTN; DM; and chronic diastolic CHF presenting with chest pain.  She last saw Dr. Gwenlyn Found in the clinic on 3/23 and reported occasional CP that was NTG-responsive and so she was planned for a Lexiscan on 4/1 but she did not show up for the test and so it was rescheduled for 4/14.  She reports that this was pre-op for hip surgery.  She reports that she is supposed to go tomorrow for LE arterial duplex.   She developed chest pain this AM from 7 this AM.  He was consistent and continued to hurt with radiation of pain into her back and shoulders.  No h/o similar pain.  She belched twice but the  pain has not improved.  She is hurting in her right groin, and in her back across her shoulders as well as in her chest.  She takes NTG at home with improvement usually, but she took it twice at home without improvement.  Has not noticed SOB.  She does not remember if she had pain with her prior CABG.  She has a reported anaphylactic allergy to contrast dye."  Hospital Course:  Summary of her active problems in the hospital is as following.  Chest pain with h/o CAD -Patient with h/o remote CABG presenting with chronic substernal chest pressure that started this AM and did not improve with NTG -At the time of my evaluation, she was quite anxious and appeared to be saying her goodbyes to family -She reported severe chest pain with radiation into the upper back and shoulders which was concerning for dissection -I initially ordered CTA to r/o dissection but patient has a severe dye allergy and the pre-medication protocol would cause a 13 hour delay -After discussion with Dr. Burt Knack, I ordered MRA to r/o dissection and this was thankfully negative dissection but did show "The left ventricular apical myocardium appears thin and pointed in appearance which may relate to prior myocardial infarction" -Echo was also performed and showed a hyperdynamic myocardium and grade 1 diastolic dysfunction without apparent WMA -CXR unremarkable.  -HS troponin negative x 2 -EKG not indicative of acute ischemia.  -  low probability stress test per Cardiology  -d dimer elevated but no evidence of PE and lower extremity doppler negative for DVT. Also MR chest negative for large central PE per my discussion with radiology.  - pt does report GI symptoms, will increase Protonix to BID and observe.    HTN -Accelerated HTN, may be related to current presentation -She received Coreg, Hydralazine in the ER -Continue home Coreg  PVD -She does c/o tingling in her feet and R groin pain -Prior ABIs in 06/2019 with moderate  RLE arterial disease and noncompressible LLE arteries -outpt follow up as scheduled  HLD -Continue Crestor  DM controlled without any complaications Resume home meds   Chronic diastolic CHF -Echo today shows grade 1 diastolic dysfunction with preserved EF -will need to monitor for volume overload but appears euvolemic at this time  L hip arthritis -Patient is planning for THR and is undergoing cardiac pre-op clearance -Continue tramadol and Robaxin as needed for pain  RA -resume home meds   Pain control  - [place] Controlled Substance Reporting System database was reviewed. - 0 day supply was provided. - Patient was instructed, not to drive, operate heavy machinery, perform activities at heights, swimming or participation in water activities or provide baby sitting services while on Pain, Sleep and Anxiety Medications; until her outpatient Physician has advised to do so again.  - Also recommended to not to take more than prescribed Pain, Sleep and Anxiety Medications.  Patient was ambulatory without any assistance. On the day of the discharge the patient's vitals were stable, and no other acute medical condition were reported by patient. the patient was felt safe to be discharge at Home with no therapy needed on discharge.  Consultants: Cardiology  Procedures: Echocardiogram  Lower extremity vascular doppler NM stress test  Discharge Exam: General: Appear in no distress, no Rash; Oral Mucosa Clear, moist. Cardiovascular: S1 and S2 Present, no Murmur, Respiratory: normal respiratory effort, Bilateral Air entry present and no Crackles, no wheezes Abdomen: Bowel Sound present, Soft and no tenderness, no hernia Extremities: no Pedal edema, no calf tenderness Neurology: alert and oriented to time, place, and person affect appropriate.  Filed Weights   03/18/20 1745 03/18/20 1841 03/19/20 0657  Weight: 73 kg 70.3 kg 69.9 kg   Vitals:   03/19/20 1041 03/19/20 1159   BP: 118/63 (!) 151/85  Pulse:  74  Resp:    Temp:    SpO2: 99%     DISCHARGE MEDICATION: Allergies as of 03/19/2020      Reactions   Fish Allergy Hives, Shortness Of Breath   Ivp Dye [iodinated Diagnostic Agents] Shortness Of Breath, Anaphylaxis   Latex Anaphylaxis, Hives, Shortness Of Breath, Itching, Other (See Comments)   REACTION: wheezing   Shellfish Allergy Anaphylaxis, Hives, Shortness Of Breath, Other (See Comments)   All seafood   Shellfish-derived Products Anaphylaxis, Hives   Other Itching, Other (See Comments)   Patient reports allergy to perfumed detergents. Causes itching.   Metformin Nausea Only, Nausea And Vomiting      Medication List    STOP taking these medications   ferrous sulfate 325 (65 FE) MG tablet   HYDROcodone-acetaminophen 5-325 MG tablet Commonly known as: NORCO/VICODIN     TAKE these medications   acetaminophen 500 MG tablet Commonly known as: TYLENOL Take 1 tablet (500 mg total) by mouth daily as needed for moderate pain. LEG PAIN. PLEASE DISCUSS STARTING A MEDICATION FOR PAIN MANAGEMENT WITH YOUR PCP.   carvedilol 3.125 MG  tablet Commonly known as: COREG TAKE 1 TABLET (3.125 MG TOTAL) BY MOUTH 2 (TWO) TIMES DAILY.   CO Q 10 PO Take 1 tablet by mouth every other day.   folic acid 1 MG tablet Commonly known as: FOLVITE Take 1 mg by mouth daily.   furosemide 40 MG tablet Commonly known as: LASIX Take 40 Mg (1 tablet) daily   gabapentin 300 MG capsule Commonly known as: NEURONTIN TAKE 1 CAPSULE BY MOUTH THREE TIMES A DAY What changed: See the new instructions.   glimepiride 1 MG tablet Commonly known as: AMARYL Take 3 mg by mouth daily with breakfast.   isosorbide mononitrate 60 MG 24 hr tablet Commonly known as: IMDUR TAKE 1.5 TABLETS (90 MG TOTAL) BY MOUTH DAILY. Marland Kitchen   LORazepam 0.5 MG tablet Commonly known as: ATIVAN Take 0.5 mg by mouth every 6 (six) hours as needed for anxiety or sleep.   methocarbamol 500 MG  tablet Commonly known as: ROBAXIN Take 1 tablet (500 mg total) by mouth every 8 (eight) hours as needed for muscle spasms. What changed: when to take this   methotrexate 2.5 MG tablet Commonly known as: RHEUMATREX Take 20 mg by mouth once a week. Every Monday of each week   nitroGLYCERIN 0.4 MG SL tablet Commonly known as: NITROSTAT PLACE 1 TABLET (0.4 MG TOTAL) UNDER THE TONGUE EVERY 5 (FIVE) MINUTES AS NEEDED FOR CHEST PAIN.   pantoprazole 40 MG tablet Commonly known as: PROTONIX Take 1 tablet (40 mg total) by mouth 2 (two) times daily before a meal. What changed: when to take this   Plavix 75 MG tablet Generic drug: clopidogrel Take 75 mg by mouth daily.   predniSONE 5 MG tablet Commonly known as: DELTASONE Take 5 mg by mouth daily with breakfast.   rosuvastatin 20 MG tablet Commonly known as: CRESTOR Take 20 mg by mouth at bedtime.   sulfaSALAzine 500 MG EC tablet Commonly known as: AZULFIDINE Take 1,000 mg by mouth daily.   traMADol 50 MG tablet Commonly known as: ULTRAM Take 1 tablet (50 mg total) by mouth every 8 (eight) hours as needed for severe pain. What changed:   when to take this  reasons to take this      Allergies  Allergen Reactions  . Fish Allergy Hives and Shortness Of Breath  . Ivp Dye [Iodinated Diagnostic Agents] Shortness Of Breath and Anaphylaxis  . Latex Anaphylaxis, Hives, Shortness Of Breath, Itching and Other (See Comments)    REACTION: wheezing  . Shellfish Allergy Anaphylaxis, Hives, Shortness Of Breath and Other (See Comments)    All seafood  . Shellfish-Derived Products Anaphylaxis and Hives  . Other Itching and Other (See Comments)    Patient reports allergy to perfumed detergents. Causes itching.  . Metformin Nausea Only and Nausea And Vomiting   Discharge Instructions    Diet - low sodium heart healthy   Complete by: As directed    Increase activity slowly   Complete by: As directed       The results of significant  diagnostics from this hospitalization (including imaging, microbiology, ancillary and laboratory) are listed below for reference.    Significant Diagnostic Studies: DG Chest 2 View  Result Date: 03/18/2020 CLINICAL DATA:  Chest pain EXAM: CHEST - 2 VIEW COMPARISON:  11/17/2019 FINDINGS: Hypoventilation with decreased lung volume. Right lower lobe atelectasis and elevated right hemidiaphragm unchanged. Postop CABG.  Negative for heart failure or effusion. IMPRESSION: Hypoventilation with decreased lung volume and right lower lobe atelectasis. No  acute abnormality Electronically Signed   By: Franchot Gallo M.D.   On: 03/18/2020 10:17   MR ANGIO CHEST W WO CONTRAST  Result Date: 03/18/2020 CLINICAL DATA:  Chest pain. History of coronary artery disease. Allergy to iodinated contrast material. EXAM: MRA CHEST WITH OR WITHOUT CONTRAST TECHNIQUE: Angiographic images of the chest were obtained using MRA technique with intravenous contrast. CONTRAST:  63mL GADAVIST GADOBUTROL 1 MMOL/ML IV SOLN COMPARISON:  CT of the chest without contrast on 11/17/2019 FINDINGS: VASCULAR Aorta: The thoracic aorta is of normal caliber. There is no evidence of aneurysmal disease, dissection or intramural hemorrhage. Visualized proximal great vessels demonstrate normal patency. The left subclavian artery is moderately tortuous. Heart: Overall heart size appears normal. The left ventricular apical myocardium appears thin and pointed in appearance which may relate to prior myocardial infarction. No pericardial fluid is identified. Pulmonary Arteries: Central pulmonary arteries are normal in caliber. NON-VASCULAR Visualized mediastinum and hilar regions demonstrate no lymphadenopathy or masses. Visualized lung fields show no masses, consolidation or pleural fluid. Visualized upper abdominal structures appear unremarkable. No visualized bony abnormalities. IMPRESSION: 1. No evidence of thoracic aortic aneurysm, dissection or intramural  hemorrhage. 2. The left ventricular apical myocardium appears thin and pointed in appearance which may relate to prior myocardial infarction. Electronically Signed   By: Aletta Edouard M.D.   On: 03/18/2020 16:35   NM Myocar Multi W/Spect W/Wall Motion / EF  Result Date: 03/19/2020 CLINICAL DATA:  Chest pain and history of prior CABG, PCI and myocardial infarction. EXAM: MYOCARDIAL IMAGING WITH SPECT (REST AND PHARMACOLOGIC-STRESS) GATED LEFT VENTRICULAR WALL MOTION STUDY LEFT VENTRICULAR EJECTION FRACTION TECHNIQUE: Standard myocardial SPECT imaging was performed after resting intravenous injection of 10 mCi Tc-30m tetrofosmin. Subsequently, intravenous infusion of Lexiscan was performed under the supervision of the Cardiology staff. At peak effect of the drug, 30 mCi Tc-106m tetrofosmin was injected intravenously and standard myocardial SPECT imaging was performed. Quantitative gated imaging was also performed to evaluate left ventricular wall motion, and estimate left ventricular ejection fraction. COMPARISON:  08/12/2017 FINDINGS: Perfusion: There is some rotational difference in appearance of the left ventricle between the stress and rest acquisitions as well as some encroachment by bowel activity on the inferior wall, especially on the rest acquisition. There is suggestion of probable infarcts involving the left ventricular apex, septum and lateral wall. There may be subtle inducible ischemia in the inferior wall. Wall Motion: Normal left ventricular wall motion. No left ventricular dilation. Left Ventricular Ejection Fraction: 81 % End diastolic volume 62 ml End systolic volume 12 ml IMPRESSION: 1. The SPECT acquisition is not optimal due to some rotational differences in appearance of the left ventricle between stress and rest acquisitions. There is suggestion of probable infarcts involving the left ventricular apex, septum and lateral wall. There may be subtle inducible ischemia in the inferior wall. 2.  Normal left ventricular wall motion. 3. Left ventricular ejection fraction 81% 4. Non invasive risk stratification*: Intermediate *2012 Appropriate Use Criteria for Coronary Revascularization Focused Update: J Am Coll Cardiol. [id number]. http://content.airportbarriers.com.aspx?articleid=[email or web address] Electronically Signed   By: Aletta Edouard M.D.   On: 03/19/2020 13:37   ECHOCARDIOGRAM COMPLETE  Result Date: 03/18/2020    ECHOCARDIOGRAM REPORT   Patient Name:   LUWAM CURRIE Date of Exam: 03/18/2020 Medical Rec #:  [id number]    Height:       61.0 in Accession #:    [id number]   Weight:       161.0 lb Date of Birth:  1933-05-01  BSA:          1.722 m Patient Age:    86 years     BP:           185/94 mmHg Patient Gender: F            HR:           96 bpm. Exam Location:  Inpatient Procedure: 2D Echo, Cardiac Doppler and Color Doppler Indications:    R07.9* Chest pain, unspecified. Possible aortic dissection.  History:        Patient has prior history of Echocardiogram examinations, most                 recent 09/14/2018. Previous Myocardial Infarction, Prior CABG,                 Signs/Symptoms:Chest Pain and Dyspnea; Risk Factors:Diabetes.  Sonographer:    Roseanna Rainbow RDCS Referring Phys: 35 [place] Comments: Technically difficult study due to poor echo windows. IMPRESSIONS  1. Left ventricular ejection fraction, by estimation, is 70 to 75%. The left ventricle has hyperdynamic function. The left ventricle has no regional wall motion abnormalities. There is mild concentric left ventricular hypertrophy. Left ventricular diastolic parameters are consistent with Grade I diastolic dysfunction (impaired relaxation).  2. Right ventricular systolic function is normal. The right ventricular size is normal. There is normal pulmonary artery systolic pressure.  3. Left atrial size was mildly dilated.  4. The mitral valve is normal in structure. No evidence of mitral valve regurgitation.  5. The aortic  valve is tricuspid. Aortic valve regurgitation is not visualized. Mild aortic valve sclerosis is present, with no evidence of aortic valve stenosis.  6. The inferior vena cava is normal in size with greater than 50% respiratory variability, suggesting right atrial pressure of 3 mmHg. Comparison(s): No significant change from prior study. Prior images reviewed side by side. FINDINGS  Left Ventricle: Left ventricular ejection fraction, by estimation, is 70 to 75%. The left ventricle has hyperdynamic function. The left ventricle has no regional wall motion abnormalities. The left ventricular internal cavity size was normal in size. There is mild concentric left ventricular hypertrophy. Left ventricular diastolic parameters are consistent with Grade I diastolic dysfunction (impaired relaxation). Normal left ventricular filling pressure. Right Ventricle: The right ventricular size is normal. No increase in right ventricular wall thickness. Right ventricular systolic function is normal. There is normal pulmonary artery systolic pressure. The tricuspid regurgitant velocity is 2.19 m/s, and  with an assumed right atrial pressure of 3 mmHg, the estimated right ventricular systolic pressure is [id number] mmHg. Left Atrium: Left atrial size was mildly dilated. Right Atrium: Right atrial size was normal in size. Pericardium: There is no evidence of pericardial effusion. Presence of pericardial fat pad. Mitral Valve: The mitral valve is normal in structure. No evidence of mitral valve regurgitation. Tricuspid Valve: The tricuspid valve is normal in structure. Tricuspid valve regurgitation is mild. Aortic Valve: The aortic valve is tricuspid. Aortic valve regurgitation is not visualized. Mild aortic valve sclerosis is present, with no evidence of aortic valve stenosis. Pulmonic Valve: The pulmonic valve was grossly normal. Pulmonic valve regurgitation is not visualized. Aorta: A prominent calcified plaque is seen at the right coronary  ostium. No evidence for dissection in visualized portions of the aorta. The aortic root and ascending aorta are structurally normal, with no evidence of dilitation. Venous: The inferior vena cava is normal in size with greater than 50% respiratory variability, suggesting right  atrial pressure of 3 mmHg. IAS/Shunts: No atrial level shunt detected by color flow Doppler.  LEFT VENTRICLE PLAX 2D LVIDd:         2.90 cm     Diastology LVIDs:         1.96 cm     LV e' lateral:   7.07 cm/s LV PW:         1.17 cm     LV E/e' lateral: 10.3 LV IVS:        1.59 cm     LV e' medial:    6.42 cm/s LVOT diam:     1.90 cm     LV E/e' medial:  11.3 LV SV:         59 LV SV Index:   34 LVOT Area:     2.84 cm  LV Volumes (MOD) LV vol d, MOD A4C: 53.6 ml LV vol s, MOD A4C: 11.5 ml LV SV MOD A4C:     53.6 ml RIGHT VENTRICLE             IVC RV S prime:     11.00 cm/s  IVC diam: 1.16 cm TAPSE (M-mode): 1.2 cm LEFT ATRIUM           Index       RIGHT ATRIUM           Index LA diam:      3.40 cm 1.97 cm/m  RA Area:     10.10 cm LA Vol (A2C): 24.7 ml 14.34 ml/m RA Volume:   18.90 ml  10.97 ml/m LA Vol (A4C): 41.6 ml 24.16 ml/m  AORTIC VALVE LVOT Vmax:   129.00 cm/s LVOT Vmean:  81.300 cm/s LVOT VTI:    0.208 m  AORTA Ao Root diam: 3.20 cm Ao Asc diam:  3.40 cm Ao Desc diam: 2.40 cm MITRAL VALVE               TRICUSPID VALVE MV Area (PHT): 6.96 cm    TR Peak grad:   19.2 mmHg MV Decel Time: 109 msec    TR Vmax:        219.00 cm/s MV E velocity: 72.55 cm/s MV A velocity: 95.60 cm/s  SHUNTS MV E/A ratio:  0.76        Systemic VTI:  0.21 m                            Systemic Diam: 1.90 cm Dani Gobble Croitoru MD Electronically signed by Sanda Klein MD Signature Date/Time: 03/18/2020/5:21:36 PM    Final    US PELVIC COMPLETE WITH TRANSVAGINAL  Result Date: 03/10/2020 CLINICAL DATA:  Initial evaluation for pelvic pain for 4 months. History of prior hysterectomy. EXAM: TRANSABDOMINAL AND TRANSVAGINAL ULTRASOUND OF PELVIS TECHNIQUE: Both  transabdominal and transvaginal ultrasound examinations of the pelvis were performed. Transabdominal technique was performed for global imaging of the pelvis including uterus, ovaries, adnexal regions, and pelvic cul-de-sac. It was necessary to proceed with endovaginal exam following the transabdominal exam to visualize the uterus, endometrium, and ovaries. COMPARISON:  Prior CT from 11/17/2019. FINDINGS: Uterus Prior hysterectomy.  No abnormality about the vaginal cuff. Endometrium Surgically absent. Right ovary Not visualized.  No adnexal mass. Left ovary Not visualized.  No adnexal mass. Other findings No abnormal free fluid. IMPRESSION: Prior hysterectomy with nonvisualization of either ovary. No pelvic or adnexal mass or abnormal free fluid. No findings to explain patient's symptoms identified. Electronically Signed  By: Jeannine Boga M.D.   On: 03/10/2020 15:27   VAS [place] LOWER EXTREMITY VENOUS (DVT)  Result Date: 03/19/2020  Lower Venous DVTStudy Indications: Elevated Dddimer.  Risk Factors: None identified. Limitations: Poor ultrasound/tissue interface and body habitus. Comparison Study: No prior studies. Performing Technologist: Oliver Hum RVT  Examination Guidelines: A complete evaluation includes B-mode imaging, spectral Doppler, color Doppler, and power Doppler as needed of all accessible portions of each vessel. Bilateral testing is considered an integral part of a complete examination. Limited examinations for reoccurring indications may be performed as noted. The reflux portion of the exam is performed with the patient in reverse Trendelenburg.  +---------+---------------+---------+-----------+----------+--------------+ RIGHT    CompressibilityPhasicitySpontaneityPropertiesThrombus Aging +---------+---------------+---------+-----------+----------+--------------+ CFV      Full           Yes      Yes                                  +---------+---------------+---------+-----------+----------+--------------+ SFJ      Full                                                        +---------+---------------+---------+-----------+----------+--------------+ FV Prox  Full                                                        +---------+---------------+---------+-----------+----------+--------------+ FV Mid   Full           Yes      Yes                                 +---------+---------------+---------+-----------+----------+--------------+ FV DistalFull                                                        +---------+---------------+---------+-----------+----------+--------------+ PFV      Full                                                        +---------+---------------+---------+-----------+----------+--------------+ POP      Full           Yes      Yes                                 +---------+---------------+---------+-----------+----------+--------------+ PTV      Full                                                        +---------+---------------+---------+-----------+----------+--------------+ PERO  Full                                                        +---------+---------------+---------+-----------+----------+--------------+   +---------+---------------+---------+-----------+----------+--------------+ LEFT     CompressibilityPhasicitySpontaneityPropertiesThrombus Aging +---------+---------------+---------+-----------+----------+--------------+ CFV      Full           Yes      Yes                                 +---------+---------------+---------+-----------+----------+--------------+ SFJ      Full                                                        +---------+---------------+---------+-----------+----------+--------------+ FV Prox  Full                                                         +---------+---------------+---------+-----------+----------+--------------+ FV Mid   Full                                                        +---------+---------------+---------+-----------+----------+--------------+ FV DistalFull                                                        +---------+---------------+---------+-----------+----------+--------------+ PFV      Full                                                        +---------+---------------+---------+-----------+----------+--------------+ POP      Full           Yes      Yes                                 +---------+---------------+---------+-----------+----------+--------------+ PTV      Full                                                        +---------+---------------+---------+-----------+----------+--------------+ PERO     Full                                                        +---------+---------------+---------+-----------+----------+--------------+  Summary: RIGHT: - There is no evidence of deep vein thrombosis in the lower extremity. However, portions of this examination were limited- see technologist comments above.  - A cystic structure is found in the popliteal fossa.  LEFT: - There is no evidence of deep vein thrombosis in the lower extremity.  - No cystic structure found in the popliteal fossa.  *See table(s) above for measurements and observations. Electronically signed by Servando Snare MD on 03/19/2020 at 5:09:14 PM.    Final     Microbiology: Recent Results (from the past 240 hour(s))  Urine culture     Status: Abnormal   Collection Time: 03/18/20 11:35 AM   Specimen: Urine, Random  Result Value Ref Range Status   Specimen Description URINE, RANDOM  Final   Special Requests   Final    NONE Performed at [facility], [address] [place], [place] [address]    Culture MULTIPLE SPECIES PRESENT, SUGGEST RECOLLECTION (A)  Final   Report Status 03/19/2020 FINAL   Final  SARS CORONAVIRUS 2 (TAT 6-24 HRS) Nasopharyngeal Nasopharyngeal Swab     Status: None   Collection Time: 03/18/20  1:12 PM   Specimen: Nasopharyngeal Swab  Result Value Ref Range Status   SARS Coronavirus 2 NEGATIVE NEGATIVE Final    Comment: (NOTE) SARS-CoV-2 target nucleic acids are NOT DETECTED. The SARS-CoV-2 RNA is generally detectable in upper and lower respiratory specimens during the acute phase of infection. Negative results do not preclude SARS-CoV-2 infection, do not rule out co-infections with other pathogens, and should not be used as the sole basis for treatment or other patient management decisions. Negative results must be combined with clinical observations, patient history, and epidemiological information. The expected result is Negative. Fact Sheet for Patients: SugarRoll.be Fact Sheet for Healthcare Providers: https://www.woods-mathews.com/ This test is not yet approved or cleared by the [place] FDA and  has been authorized for detection and/or diagnosis of SARS-CoV-2 by FDA under an Emergency Use Authorization (EUA). This EUA will remain  in effect (meaning this test can be used) for the duration of the COVID-19 declaration under Section 56 4(b)(1) of the Act, 21 U.S.C. section 360bbb-3(b)(1), unless the authorization is terminated or revoked sooner. Performed at [facility], [place] [address] [place], [place] [address]      Labs: CBC: Recent Labs  Lab 03/18/20 1144 03/19/20 0029 03/19/20 1246  WBC 5.4 5.0 6.9  NEUTROABS  --   --  3.9  HGB 14.0 11.7* 12.5  HCT 46.2* 37.9 39.7  MCV 91.5 91.3 90.6  PLT 214 240 [id number]   Basic Metabolic Panel: Recent Labs  Lab 03/18/20 1144 03/19/20 0029  NA 140 136  K 4.0 4.1  CL 99 100  CO2 28 25  GLUCOSE 175* 258*  BUN 11 15  CREATININE 0.93 1.12*  CALCIUM 10.0 8.9   Liver Function Tests: No results for input(s): AST, ALT, ALKPHOS, BILITOT, PROT,  ALBUMIN in the last 168 hours. No results for input(s): LIPASE, AMYLASE in the last 168 hours. No results for input(s): AMMONIA in the last 168 hours. Cardiac Enzymes: No results for input(s): CKTOTAL, CKMB, CKMBINDEX, TROPONINI in the last 168 hours. BNP (last 3 results) No results for input(s): BNP in the last 8760 hours. CBG: Recent Labs  Lab 03/18/20 2303 03/19/20 1149 03/19/20 1658  GLUCAP 280* 121* 184*    Time spent: 35 minutes  Signed:  Berle Mull  [facility] 03/19/2020 7:01 AM

## 2020-03-20 NOTE — Telephone Encounter (Signed)
Close encounter 

## 2020-03-26 ENCOUNTER — Inpatient Hospital Stay (HOSPITAL_COMMUNITY): Admission: RE | Admit: 2020-03-26 | Payer: Medicare Other | Source: Ambulatory Visit

## 2020-03-31 ENCOUNTER — Other Ambulatory Visit: Payer: Self-pay

## 2020-03-31 ENCOUNTER — Ambulatory Visit (HOSPITAL_COMMUNITY)
Admission: RE | Admit: 2020-03-31 | Discharge: 2020-03-31 | Disposition: A | Discharge status: Home / Self Care | Payer: Medicare Other | Source: Ambulatory Visit | Attending: Cardiovascular Disease | Admitting: Cardiovascular Disease

## 2020-03-31 DIAGNOSIS — I739 Peripheral vascular disease, unspecified: Secondary | ICD-10-CM | POA: Insufficient documentation

## 2020-03-31 DIAGNOSIS — Z01818 Encounter for other preprocedural examination: Secondary | ICD-10-CM | POA: Insufficient documentation

## 2020-04-01 ENCOUNTER — Ambulatory Visit: Payer: Medicare Other | Admitting: Cardiology

## 2020-04-08 ENCOUNTER — Encounter: Payer: Self-pay | Admitting: Cardiovascular Disease

## 2020-04-08 ENCOUNTER — Other Ambulatory Visit: Payer: Self-pay

## 2020-04-08 ENCOUNTER — Ambulatory Visit (INDEPENDENT_AMBULATORY_CARE_PROVIDER_SITE_OTHER): Payer: Medicare Other | Admitting: Cardiovascular Disease

## 2020-04-08 VITALS — BP 161/73 | HR 92 | Temp 98.1°F | Resp 18 | Ht 63.0 in | Wt 158.2 lb

## 2020-04-08 DIAGNOSIS — I739 Peripheral vascular disease, unspecified: Secondary | ICD-10-CM

## 2020-04-08 DIAGNOSIS — I110 Hypertensive heart disease with heart failure: Secondary | ICD-10-CM | POA: Diagnosis not present

## 2020-04-08 DIAGNOSIS — I25119 Atherosclerotic heart disease of native coronary artery with unspecified angina pectoris: Secondary | ICD-10-CM

## 2020-04-08 DIAGNOSIS — E785 Hyperlipidemia, unspecified: Secondary | ICD-10-CM

## 2020-04-08 DIAGNOSIS — R0989 Other specified symptoms and signs involving the circulatory and respiratory systems: Secondary | ICD-10-CM

## 2020-04-08 DIAGNOSIS — I25810 Atherosclerosis of coronary artery bypass graft(s) without angina pectoris: Secondary | ICD-10-CM

## 2020-04-08 DIAGNOSIS — I5030 Unspecified diastolic (congestive) heart failure: Secondary | ICD-10-CM

## 2020-04-08 MED ORDER — ROSUVASTATIN CALCIUM 40 MG PO TABS: 40.0000 mg | ORAL_TABLET | Freq: Every day | ORAL | 2 refills | Status: DC

## 2020-04-08 MED ORDER — CARVEDILOL 6.25 MG PO TABS: 6.2500 mg | ORAL_TABLET | Freq: Two times a day (BID) | ORAL | 3 refills | Status: DC

## 2020-04-08 NOTE — Progress Notes (Signed)
04/08/2020 [name]   1933/08/15  [id number]  Primary Physician Jani Gravel, MD Primary Cardiologist: Lorretta Harp MD [name], [place]  HPI:  [name] is a 86 y.o.  married African American female patient of Dr. Shanon Brow Harding's and Dr. Leigh Aurora.I last saw her in the office  03/04/2020.Marland KitchenShe has known CAD status post coronary bypass grafting in the past (1998). She has preserved LV function and nonischemic Myoview. Because of claudication she underwent peripheral vascular angiography and other [name] 02/22/13 revealing significant popliteal and infrapopliteal disease. She has lifestyle limiting claudication.on 05/16/13 she underwent diamondback orbital rotational atherectomy, PTA of her distal right SFA and stenting using coronary drug-eluting stent for peroneal artery which was her only patent artery on the right. She does not notice any improvement and claudication since her procedure although she says her right leg his less "heavy" and that her left leg is much more symptomatic.I restudied her 07/20/13 with the intention of fixing her left leg however I decided to defer this because of anatomic complexity. Ultimately I referred her to Dr. Brunetta Jeans in [place] who performed intervention on her left popliteal and tibioperoneal trunk on 10/01/13. Followup Dopplers performed 04/29/14 were excellent with ABIs of greater than 1 bilaterally, patent peroneal stent on the right a patent left popliteal. Subsequent Dopplers performed in December showed a right ABI of 0.81 and a left ABI 0.91 with patent stents  She had done well after intervention and actually said that she was able to ambulate with claudication. Her last Doppler studies performed 11/10/16 revealed normal ABIs bilaterally with patent SFAs and infrapopliteal vessels. She has noticed some superficial hypersensitivity on her left pretibial area that had venous Doppler studies performed recently that showed no evidence of  DVT.   She did have a right total hip replacement 11/19 which she is still recovering from. Recent lower extremity arterial Doppler studies performed 06/21/2019 revealed a decline in her right ABI 0..76 with a high-frequency signal in her right popliteal artery and monophasic waveforms below that. She complains of pain in both legs from the knee down left greater than right at rest and with exertion despite normal left ABIs and a palpable pedal pulse on that side.   She apparently needs an elective left total knee replacement.  She has difficulty ambulating mostly because of pain in her left leg.  She also has chronic lower extremity edema left greater than right on low-dose furosemide which we are going to increase from 20 to 40 mg a day.  She takes occasional sublingual nitroglycerin every other week for chest pain.  She did have a nonischemic Myoview for preoperative clearance before her right total hip replacement 09/15/2018.  She was admitted to the hospital on 03/18/2020 with chest pain.  She ruled out for myocardial infarction.  A Myoview stress test was read as low risk with mild inferior ischemia.  She also had lower extremity arterial Doppler studies performed in our office 03/31/2020 which revealed a right ABI of 0.39 with an occluded right popliteal artery and a left of 1.12.  I cleared her for her left total hip replacement low risk given her recent Myoview stress test results.  She does not really complain of lifestyle limiting claudication on the right.  No outpatient medications have been marked as taking for the 04/08/20 encounter (Office Visit) with Lorretta Harp, MD.     Allergies  Allergen Reactions  . Fish Allergy Hives and Shortness Of  Breath  . Ivp Dye [Iodinated Diagnostic Agents] Shortness Of Breath and Anaphylaxis  . Latex Anaphylaxis, Hives, Shortness Of Breath, Itching and Other (See Comments)    REACTION: wheezing  . Shellfish Allergy Anaphylaxis, Hives, Shortness Of  Breath and Other (See Comments)    All seafood  . Shellfish-Derived Products Anaphylaxis and Hives  . Other Itching and Other (See Comments)    Patient reports allergy to perfumed detergents. Causes itching.  . Metformin Nausea Only and Nausea And Vomiting    Social History   Socioeconomic History  . Marital status: Married    Spouse name: Not on file  . Number of children: Not on file  . Years of education: Not on file  . Highest education level: Not on file  Occupational History  . Occupation: retired  Tobacco Use  . Smoking status: Never Smoker  . Smokeless tobacco: Never Used  Substance and Sexual Activity  . Alcohol use: No    Comment: rare glass of wine   . Drug use: No  . Sexual activity: Not on file  Other Topics Concern  . Not on file  Social History Narrative    She is a married mother of 48, grandmother of 9, great grandmother of 93.    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    She does not really get a lot of exercise,    She does not smoke or drink EtOH.   Social Determinants of Health   Financial Resource Strain:   . Difficulty of Paying Living Expenses:   Food Insecurity:   . Worried About Charity fundraiser in the Last Year:   . Arboriculturist in the Last Year:   Transportation Needs:   . Film/video editor (Medical):   Marland Kitchen Lack of Transportation (Non-Medical):   Physical Activity:   . Days of Exercise per Week:   . Minutes of Exercise per Session:   Stress:   . Feeling of Stress :   Social Connections:   . Frequency of Communication with Friends and Family:   . Frequency of Social Gatherings with Friends and Family:   . Attends Religious Services:   . Active Member of Clubs or Organizations:   . Attends Archivist Meetings:   Marland Kitchen Marital Status:   Intimate Partner Violence:   . Fear of Current or Ex-Partner:   . Emotionally Abused:   Marland Kitchen Physically Abused:   . Sexually Abused:      Review of Systems: General:  negative for chills, fever, night sweats or weight changes.  Cardiovascular: negative for chest pain, dyspnea on exertion, edema, orthopnea, palpitations, paroxysmal nocturnal dyspnea or shortness of breath Dermatological: negative for rash Respiratory: negative for cough or wheezing Urologic: negative for hematuria Abdominal: negative for nausea, vomiting, diarrhea, bright red blood per rectum, melena, or hematemesis Neurologic: negative for visual changes, syncope, or dizziness All other systems reviewed and are otherwise negative except as noted above.    Blood pressure (!) 161/73, pulse 92, temperature 98.1 F (36.7 C), resp. rate 18, height 5\' 3"  (1.6 m), weight 158 lb 3.2 oz (71.8 kg), SpO2 96 %.  General appearance: alert and no distress Neck: no adenopathy, no JVD, supple, symmetrical, trachea midline, thyroid not enlarged, symmetric, no tenderness/mass/nodules and Left carotid bruit Lungs: clear to auscultation bilaterally Heart: regular rate and rhythm, S1, S2 normal, no murmur, click, rub or gallop Extremities: extremities normal, atraumatic, no cyanosis or edema Pulses: Diminished pedal  pulses Skin: Skin color, texture, turgor normal. No rashes or lesions Neurologic: Alert and oriented X 3, normal strength and tone. Normal symmetric reflexes. Normal coordination and gait  EKG not performed today  ASSESSMENT AND PLAN:   Atherosclerotic heart disease of native coronary artery with angina pectoris ([place]) History of CAD status post remote CABG in 1998.  She was complaining of rare nitro responsive chest pain but was admitted to the hospital on 03/19/2020 with accelerated angina.  She ruled out for myocardial infarction.  A Myoview stress test performed on 03/19/2020 showed subtle inferior ischemia which was read as "low risk.  She has had no recurrent symptoms.  PAD,severe calcified pop and tibial peroneal trunk disease bilateraly History of PAD status post diamondback orbital  rotational atherectomy, PTA and stenting of her distal right SFA 05/16/2013.  She had left popliteal intervention by Dr. Andree Elk after I restudied her 07/20/2013 revealing complex popliteal anatomy.  Dr. Andree Elk intervened on her left popliteal artery and tibioperoneal trunk 10/01/2013.  Her most recent lower extremity arterial Doppler studies performed 03/31/2020 revealed a right ABI of 0.39 and a left of 1.12.  She has an occluded right popliteal artery.  She walks with a cane but does not appear to have lifestyle limiting claudication on that side.  Dyslipidemia, goal LDL below 70 History of dyslipidemia on Crestor 20 mg a day with lipid profile performed during her recent hospitalization revealing cholesterol 177, LDL of 119, up from 73 and HDL 51.  I am going to increase her Crestor to 40 mg a day and we will recheck  Essential hypertension History of essential hypertension with blood pressure measured today at 161/73.  She is on low-dose carvedilol.  I am going to increase this from 3.125 to 6.25 mg p.o. twice daily.      Lorretta Harp MD FACP,FACC,FAHA, [facility] 04/08/2020 4:25 PM

## 2020-04-08 NOTE — Assessment & Plan Note (Signed)
History of CAD status post remote CABG in 1998.  She was complaining of rare nitro responsive chest pain but was admitted to the hospital on 03/19/2020 with accelerated angina.  She ruled out for myocardial infarction.  A Myoview stress test performed on 03/19/2020 showed subtle inferior ischemia which was read as "low risk.  She has had no recurrent symptoms.

## 2020-04-08 NOTE — Assessment & Plan Note (Signed)
History of PAD status post diamondback orbital rotational atherectomy, PTA and stenting of her distal right SFA 05/16/2013.  She had left popliteal intervention by Dr. Andree Elk after I restudied her 07/20/2013 revealing complex popliteal anatomy.  Dr. Andree Elk intervened on her left popliteal artery and tibioperoneal trunk 10/01/2013.  Her most recent lower extremity arterial Doppler studies performed 03/31/2020 revealed a right ABI of 0.39 and a left of 1.12.  She has an occluded right popliteal artery.  She walks with a cane but does not appear to have lifestyle limiting claudication on that side.

## 2020-04-08 NOTE — Patient Instructions (Signed)
Medication Instructions:  Increase Carvedilol to 6.25 twice daily  Increase Crestor to 40 mg daily   *If you need a refill on your cardiac medications before your next appointment, please call your pharmacy*   Lab Work: LIPID and LIVER in 2 months  Attached are the lab orders that are needed before your upcoming appointment, please come in 2 months anytime to have your labs drawn.   They are fasting labs, so nothing to eat or drink after midnight.  Lab hours: 8:00-4:00 lunch hours 12:45-1:45  If you have labs (blood work) drawn today and your tests are completely normal, you will receive your results only by: Marland Kitchen MyChart Message (if you have MyChart) OR . A paper copy in the mail If you have any lab test that is abnormal or we need to change your treatment, we will call you to review the results.   Testing/Procedures: Your physician has requested that you have a carotid duplex. This test is an ultrasound of the carotid arteries in your neck. It looks at blood flow through these arteries that supply the brain with blood. Allow one hour for this exam. There are no restrictions or special instructions.   Follow-Up: At [facility], you and your health needs are our priority.  As part of our continuing mission to provide you with exceptional heart care, we have created designated Provider Care Teams.  These Care Teams include your primary Cardiologist (physician) and Advanced Practice Providers (APPs -  Physician Assistants and Nurse Practitioners) who all work together to provide you with the care you need, when you need it.  We recommend signing up for the patient portal called "MyChart".  Sign up information is provided on this After Visit Summary.  MyChart is used to connect with patients for Virtual Visits (Telemedicine).  Patients are able to view lab/test results, encounter notes, upcoming appointments, etc.  Non-urgent messages can be sent to your provider as well.   To learn more  about what you can do with MyChart, go to [email or web address].    Your next appointment:   Follow up to see APP in 3 months Follow up to Dr.Berry in 6 months   Other Instructions Please get a omron blood pressure cuff to monitor BP at home.

## 2020-04-08 NOTE — Assessment & Plan Note (Signed)
History of dyslipidemia on Crestor 20 mg a day with lipid profile performed during her recent hospitalization revealing cholesterol 177, LDL of 119, up from 73 and HDL 51.  I am going to increase her Crestor to 40 mg a day and we will recheck

## 2020-04-08 NOTE — Assessment & Plan Note (Addendum)
History of essential hypertension with blood pressure measured today at 161/73.  She is on low-dose carvedilol.  I am going to increase this from 3.125 to 6.25 mg p.o. twice daily.

## 2020-04-10 ENCOUNTER — Ambulatory Visit (INDEPENDENT_AMBULATORY_CARE_PROVIDER_SITE_OTHER): Payer: Medicare Other | Admitting: Orthopaedic Surgery

## 2020-04-10 ENCOUNTER — Other Ambulatory Visit: Payer: Self-pay

## 2020-04-10 ENCOUNTER — Encounter: Payer: Self-pay | Admitting: Orthopaedic Surgery

## 2020-04-10 DIAGNOSIS — I25810 Atherosclerosis of coronary artery bypass graft(s) without angina pectoris: Secondary | ICD-10-CM

## 2020-04-10 DIAGNOSIS — M1612 Unilateral primary osteoarthritis, left hip: Secondary | ICD-10-CM

## 2020-04-10 MED ORDER — ACETAMINOPHEN-CODEINE #3 300-30 MG PO TABS: 1.0000 | ORAL_TABLET | Freq: Three times a day (TID) | ORAL | 0 refills | Status: DC | PRN

## 2020-04-10 NOTE — Progress Notes (Signed)
Office Visit Note   Patient: [name]           Date of Birth: [birth year]           MRN: [id number] Visit Date: 04/10/2020              Requested by: Jani Gravel, MD [address] [place],  [place] [address] PCP: Jani Gravel, MD   Assessment & Plan: Visit Diagnoses:  1. Unilateral primary osteoarthritis, left hip     Plan: At this point I agree with proceeding with a left total hip given the severity of her arthritis.  She is going to continue to work on blood glucose control which is much better over the last week.  Having had surgery before she is fully aware of the risk and benefits of the surgery.  Since she has received cardiac clearance we will work on getting this scheduled.  Would like to do this under spinal anesthesia.  She is on Plavix and will need to stop this 7 days before surgery.  All questions and concerns were answered and addressed.  Follow-Up Instructions: Return for 2 weeks post-op.   Orders:  No orders of the defined types were placed in this encounter.  No orders of the defined types were placed in this encounter.     Procedures: No procedures performed   Clinical Data: No additional findings.   Subjective: Chief Complaint  Patient presents with  . Left Hip - Pain  The patient is very well-known to me.  I did replace her right hip last year.  Her left hip has debilitating end-stage arthritis in it.  She is even recently hospitalized for chest pain.  She is a diabetic and on Plavix.  She does ambulate with a cane and has severe left hip pain.  At this point her left hip pain is definitely affecting her mobility, her quality of life and her actives daily living.  She is in need of hip replacement surgery.  She has been cleared by her cardiologist Dr. Quay Burow for [place] proceeding with a hip replacement on her left hip.  Family is with her today.  She has tried and failed all forms of conservative treatment and this is been worsening for  over a year.  Radiographic evidence that we have already obtained shows severe end-stage arthritis of the left hip.  She is a diabetic.  Her last hemoglobin A1c was 7.7.   HPI  Review of Systems She currently denies any headache, chest pain, shortness of breath, fever, chills, nausea, vomiting  Objective: Vital Signs: There were no vitals taken for this visit.  Physical Exam She is alert and orient x3 and in no acute distress Ortho Exam Examination of her left hip shows severe pain with any attempts of internal and external rotation.  Her hip is very stiff on rotation as well. Specialty Comments:  No specialty comments available.  Imaging: No results found. A previous AP pelvis and lateral of the left hip shows severe end-stage arthritis of the left hip.  There is cystic changes in the femoral head and acetabulum.  There is flattening of the femoral head as well.  She does have her right total hip arthroplasty that appears normal.  PMFS History: Patient Active Problem List   Diagnosis Date Noted  . Chest pain 03/18/2020  . Unilateral primary osteoarthritis, left hip 04/03/2019  . History of right hip replacement 04/03/2019  . Status post total replacement of right  hip 10/12/2018  . Unilateral primary osteoarthritis, right hip 09/12/2018  . Degenerative joint disease 05/30/2018  . Anal fissure 11/10/2017  . Rectal bleeding 11/10/2017  . Rectal pain 11/10/2017  . Inflammatory arthritis 10/13/2017  . Latent tuberculosis by blood test 10/13/2017  . Chronic diastolic CHF (congestive heart failure), NYHA class 2 ([place]) 08/11/2017  . Fatigue due to treatment 01/09/2017  . NSTEMI (non-ST elevated myocardial infarction) ([place]) 11/01/2016  . Diabetes mellitus ([place]) 10/24/2015  . Non-insulin dependent type 2 diabetes mellitus ([place]) 10/24/2015  . Angina, class I ([place]) 04/22/2015  . DOE (dyspnea on exertion) 04/22/2015  . Bilateral leg edema 04/22/2015  . Accumulation of fluid in tissues  03/21/2014  . Vertigo, central 01/02/2014  . Vertigo 01/02/2014  . Limb pain- echymosis, pain Lt radial artery after cath 12/11/2013  . Dyslipidemia, goal LDL below 70 05/17/2013  . Essential hypertension 05/17/2013  . Claudication ([place]) 05/16/2013  . PTA/ Stent Rt popliteal and Rt peroneal artery 05/16/13 05/16/2013  . PAD,severe calcified pop and tibial peroneal trunk disease bilateraly   . ABDOMINAL PAIN RIGHT LOWER QUADRANT 10/07/2010  . CVA-STROKE 03/04/2010  . BARRETT'S ESOPHAGUS 03/04/2010  . FECAL INCONTINENCE 03/04/2010  . DM 01/14/2010  . Anemia of chronic disease 01/14/2010  . Atherosclerotic heart disease of native coronary artery with angina pectoris ([place]) 01/14/2010  . DIVERTICULOSIS OF COLON 01/14/2010  . PERSONAL HISTORY OF COLONIC POLYPS 01/14/2010  . [place] ALLERGY 01/14/2010   Past Medical History:  Diagnosis Date  . Allergy to IVP dye   . Atherosclerotic heart disease native coronary artery w/angina pectoris ([place]) 1992   a. 1992 s/p PTCA of LAD;  b. 1998 CABG x 3; c. 07/2001 Cath: Sev LM/LAD/LCX dzs, 3/3 patent grafts; d. 11/2013 Cath: stable graft anatomy; e. 10/2016 Cath: native 3VD, VG->OM nl, LIMA->LAD nl, VG->Diag 55.  . Atrial septal aneurysm   . Bradycardia, mild briefly to the 40s, asymptomatic 05/16/2013  . Carotid arterial disease ([place])    a. 05/2014 Carotid U/S: No signif bilat ICA stenosis, >60% L ECA.  Marland Kitchen Chronic anemia   . Chronic diastolic CHF (congestive heart failure) ([place])    a. 10/2016 Echo: Ef 55-60%, no rwma, Gr1 DD, triv AI, PASP 57mmHg, atrial septal aneurysm.  . DM (diabetes mellitus), type 2 with peripheral vascular complications ([facility])    On Oral medications.  . Essential hypertension   . GERD (gastroesophageal reflux disease)   . Hyperlipidemia with target LDL less than 70   . PAD (peripheral artery disease) ([place]) 05/2013; 09/2013   a. severe calcified POP-1 & TP Trunk Dz bilat;  b . 05/2013 Diamondback Rot Athrectomy (R POP) --> PTA R  Pop, DES to R Peroneal;  c. 09/2013 PTA/Stenting of L Pop [name] - retrograde access);  d. 04/2014 ABI's: R/L: 1.1/1.1.  Marland Kitchen PUD (peptic ulcer disease)   . S/P CABG x 3 1998   LIMA-LAD, SVG-OM, SVG-D1  . Severe claudication ([place]) 05/2013   Referred for Peripheral Angio (Dr. Gwenlyn Found --> Dr. Brunetta Jeans in [place])    Family History  Problem Relation Age of Onset  . Hypertension Mother   . Hyperlipidemia Mother   . Hyperlipidemia Father   . Hypertension Father     Past Surgical History:  Procedure Laterality Date  . ATHERECTOMY Right 05/15/2013   Procedure: ATHERECTOMY;  Surgeon: Lorretta Harp, MD;  Location: [facility] CATH LAB;  Service: Cardiovascular;  Laterality: Right;  popliteal  . BACK SURGERY    . CARDIAC CATHETERIZATION  07/17/01  2 v CAD with LM, circ, LAD, obtuse diag, mod stenosis of diag vein graft , mild stenosis of marg vein graft, nl EF, medical treatment  . CARDIAC CATHETERIZATION  11/2013   Widely patent LIMA-LAD & SVG-OM with mild progression of proximal stenosis ~40-50% in SVG-D1; Widely patent native RCA with known severe native LCA disease  . CARDIAC CATHETERIZATION N/A 11/01/2016   Procedure: Left Heart Cath and Cors/Grafts Angiography;  Surgeon: [name], MD;  Location: [facility] CV LAB;  Service: Cardiovascular: Hemodynamics: High LVEDP!.  Cors/Grafts: LM 55%, o-p LAD 100% then after D1 -> LIMA-LAD patent, SVG-Diag ~55%. small RI wiht ~70% ostial. O-p Cx 80% - pCx 70% --> SVG-bifurcating OM patent. -- med Rx  . CORONARY ANGIOPLASTY  1992   PCI to LAD  . CORONARY ARTERY BYPASS GRAFT  1998   LIMA-LAD, SVG-diagonal (none roughly 50% stenosis), SVG-OM  . LEFT HEART CATHETERIZATION WITH CORONARY ANGIOGRAM N/A 11/27/2013   Procedure: LEFT HEART CATHETERIZATION WITH CORONARY ANGIOGRAM;  Surgeon: [name], MD;  Location: [facility] CATH LAB;  Service: Cardiovascular;  Laterality: N/A;  . LOW EXTREMITY DOPPLERS/ABI  10/2016   Patent right SFA, popliteal and peroneal tendons.  Patent left SFA and popliteal stent. 30-50% stenosis in the right common femoral and popliteal arteries, 50-74% stenosis in left profunda femoris artery. --> 1 year follow-up.  RABI - 1.2, LABI 1.1  . LOWER EXTREMITY ANGIOGRAM N/A 02/22/2013   Procedure: LOWER EXTREMITY ANGIOGRAM;  Surgeon: Lorretta Harp, MD;  Location: [facility] CATH LAB;  Service: Cardiovascular;  Laterality: N/A;  . LOWER EXTREMITY ANGIOGRAM N/A 07/20/2013   Procedure: LOWER EXTREMITY ANGIOGRAM;  Surgeon: Lorretta Harp, MD;  Location: [facility] CATH LAB;  Service: Cardiovascular;  Laterality: N/A;  . NM MYOVIEW LTD  04/03/2013; 5/26'/2016   Lexiscan: a)  Apical perfusion defect with no ischemia. Consider prior infarct versus breast attenuation.;; b) 5/'16: LOW RISK, Nl EF ~55-65%, No Ischemia /Infarction  . NM MYOVIEW LTD  09/2018   LOW RISK.  EF 55%.  No ischemia or infarction.  Marland Kitchen PERCUTANEOUS STENT INTERVENTION Right 05/15/2013   Procedure: PERCUTANEOUS STENT INTERVENTION;  Surgeon: Lorretta Harp, MD;  Location: [facility] CATH LAB;  Service: Cardiovascular;  Laterality: Right;  tibioperoneal trunk  . Peroneal Artery Stent  05/15/13   Diamondback rot. atherectomy of high grade segmental popliteal stenosis then [place] balloonPTA; then angiosculpt PTA of the prox peroneal with stent-Xpedition   . pv angio  07/20/13   high-grade calcified popliteal disease with one-vessel runoff via a small anterior tibial artery that has proximal disease as well, no intervention  . TOTAL HIP ARTHROPLASTY Right 10/12/2018   Procedure: RIGHT TOTAL HIP ARTHROPLASTY ANTERIOR APPROACH;  Surgeon: Mcarthur Rossetti, MD;  Location: [facility] ORS;  Service: Orthopedics;  Laterality: Right;  . TRANSTHORACIC ECHOCARDIOGRAM  10/2016   EF 55-60%. Gr 1 DD. Normal PAP. Aortic Sclerosis.  . TRANSTHORACIC ECHOCARDIOGRAM  09/2018   Normal LV size and function with vigorous EF 65 to 70%.  GR 1 DD.  Severe LA dilation (suggestive of probably worsening: GR 1 DD).  . TUBAL LIGATION       Social History   Occupational History  . Occupation: retired  Tobacco Use  . Smoking status: Never Smoker  . Smokeless tobacco: Never Used  Substance and Sexual Activity  . Alcohol use: No    Comment: rare glass of wine   . Drug use: No  . Sexual activity: Not on file

## 2020-04-11 ENCOUNTER — Ambulatory Visit (HOSPITAL_COMMUNITY)
Admission: RE | Admit: 2020-04-11 | Discharge: 2020-04-11 | Disposition: A | Discharge status: Home / Self Care | Payer: Medicare Other | Source: Ambulatory Visit | Attending: Cardiovascular Disease | Admitting: Cardiovascular Disease

## 2020-04-11 DIAGNOSIS — R0989 Other specified symptoms and signs involving the circulatory and respiratory systems: Secondary | ICD-10-CM | POA: Insufficient documentation

## 2020-04-14 ENCOUNTER — Other Ambulatory Visit: Payer: Self-pay

## 2020-04-14 DIAGNOSIS — R0989 Other specified symptoms and signs involving the circulatory and respiratory systems: Secondary | ICD-10-CM

## 2020-04-14 DIAGNOSIS — I6523 Occlusion and stenosis of bilateral carotid arteries: Secondary | ICD-10-CM

## 2020-04-14 NOTE — Progress Notes (Signed)
ro

## 2020-04-15 ENCOUNTER — Encounter: Payer: Self-pay | Admitting: Cardiology

## 2020-04-15 ENCOUNTER — Other Ambulatory Visit: Payer: Self-pay

## 2020-04-15 ENCOUNTER — Ambulatory Visit (INDEPENDENT_AMBULATORY_CARE_PROVIDER_SITE_OTHER): Payer: Medicare Other | Admitting: Cardiology

## 2020-04-15 VITALS — BP 163/84 | HR 82 | Ht 63.0 in | Wt 158.2 lb

## 2020-04-15 DIAGNOSIS — I10 Essential (primary) hypertension: Secondary | ICD-10-CM | POA: Diagnosis not present

## 2020-04-15 DIAGNOSIS — I5032 Chronic diastolic (congestive) heart failure: Secondary | ICD-10-CM

## 2020-04-15 DIAGNOSIS — Z01810 Encounter for preprocedural cardiovascular examination: Secondary | ICD-10-CM

## 2020-04-15 DIAGNOSIS — I25119 Atherosclerotic heart disease of native coronary artery with unspecified angina pectoris: Secondary | ICD-10-CM | POA: Diagnosis not present

## 2020-04-15 DIAGNOSIS — I6523 Occlusion and stenosis of bilateral carotid arteries: Secondary | ICD-10-CM

## 2020-04-15 DIAGNOSIS — E785 Hyperlipidemia, unspecified: Secondary | ICD-10-CM | POA: Diagnosis not present

## 2020-04-15 DIAGNOSIS — I214 Non-ST elevation (NSTEMI) myocardial infarction: Secondary | ICD-10-CM

## 2020-04-15 DIAGNOSIS — I739 Peripheral vascular disease, unspecified: Secondary | ICD-10-CM | POA: Diagnosis not present

## 2020-04-15 HISTORY — DX: Encounter for preprocedural cardiovascular examination: Z01.810

## 2020-04-15 NOTE — Progress Notes (Signed)
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  If No --> Step 5  Step 5 --  Clinical Risk Factors (CRF)   3 or more: No: 2  If Yes -- assess Surgical Risk, --   (High Risk Non-cardiac), Intraabdominal or thoracic vascular surgery consider testing if it will change management.  Intermediate Risk: Proceed to OR with HR control, or consider testing if it will change management  1-2 or more CRFs: Yes  If Yes -- assess Surgical Risk, --   (High Risk Non-cardiac), Intraabdominal or thoracic vascular surgery --> Proceed to OR, or consider testing if it will change management.  No further testing will be required as it would not change management.  She is just had an echocardiogram and Myoview both relatively normal.  No need for further testing.  Intermediate Risk: Proceed to OR with HR control, or consider testing if it will change management           COVID-19 Education: The signs and symptoms of COVID-19 were discussed with the patient and how to seek care for testing (follow up with PCP or arrange E-visit).   The importance of social distancing and COVID-19 vaccination was discussed today.  I spent a total of 22 minutes with the patient. >  50% of the time was spent in  direct patient consultation.  Additional time spent with chart review  / charting (studies, outside notes, etc): 20 => Patient has had at least 2 hospitalizations with multiple procedures and intervening clinic notes since last visit. Total Time: 42 min  Current medicines are reviewed at length with the patient today.  (+/- concerns) also preop  Notice: This dictation was prepared with Dragon dictation along with smaller phrase technology. Any transcriptional errors that result from this process are unintentional and may not be corrected upon review.  Patient Instructions / Medication Changes & Studies & Tests Ordered   Patient Instructions  Medication Instructions:   CLARIFICATION ON MEDICATION  1)  CONTINUE TAKING ISOSORBIDE MONO ( IMDUR)  60 MG DAILY  2)  CARVEDILOL  6.25 MG  ONE TABLET TWICE A DAY   ( FINISH THE CURRENT BOTTLE OF 3.125 MG  TAKE 2 TABLETS TWICE A DAY)    3) ROSUVASTATIN 40 MG ONE TABLET DAILY   ( FINISH THE CURRENT BOTTLE OF 20 MG  TABLETS TAKE 2 TABLET AT THE SAME TIME)   *If you need a refill on your cardiac medications before your next appointment, please call your pharmacy*   Lab Work:  [facility] LIPID- July  2021 If you have labs (blood work) drawn today and your tests are completely normal, you will receive your results only by: Marland Kitchen MyChart Message (if you have MyChart) OR . A paper copy in the mail If you have any lab test that is abnormal or we need to change your treatment, we will call you to review the results.   Testing/Procedures: Not needed   Follow-Up: At [facility], you and your health needs are our priority.  As part of our continuing mission to provide you with exceptional heart care, we have created designated Provider Care Teams.  These Care Teams include your primary Cardiologist (physician) and Advanced Practice Providers (APPs -  Physician Assistants and Nurse Practitioners) who all work together to provide you with the care you need, when  you need it.  We recommend signing up for the patient portal called "MyChart".  Sign up information is provided on this After Visit Summary.  MyChart is used to connect with patients for Virtual Visits (Telemedicine).  Patients are able to view lab/test results, encounter notes, upcoming appointments, etc.  Non-urgent messages can be sent to your provider as well.   To learn more about what you can do with MyChart, go to [email or web address].    Your next appointment:   5 month(s)  The format for your next appointment:   In Person  Provider:   [name], MD   Other Instructions  you have clearance for  Hip surgery - do not take Clopidogrel 7 days prior to  Surgery then restart after surgery.  No further testing  needed    Studies Ordered:   Orders Placed This Encounter  Procedures  . Lipid panel  . Comprehensive metabolic panel     [name], M.D., M.S. Interventional Cardiologist   Pager # [phone] Phone # [phone] [address] [place], [place] [address]   Thank you for choosing Heartcare at [facility]!!

## 2020-04-15 NOTE — Patient Instructions (Addendum)
Medication Instructions:   CLARIFICATION ON MEDICATION  1)  CONTINUE TAKING ISOSORBIDE MONO ( IMDUR)  60 MG DAILY  2)  CARVEDILOL  6.25 MG  ONE TABLET TWICE A DAY   ( FINISH THE CURRENT BOTTLE OF 3.125 MG  TAKE 2 TABLETS TWICE A DAY)    3) ROSUVASTATIN 40 MG ONE TABLET DAILY   ( FINISH THE CURRENT BOTTLE OF 20 MG  TABLETS TAKE 2 TABLET AT THE SAME TIME)   *If you need a refill on your cardiac medications before your next appointment, please call your pharmacy*   Lab Work:  [facility] LIPID- July  2021 If you have labs (blood work) drawn today and your tests are completely normal, you will receive your results only by: Marland Kitchen MyChart Message (if you have MyChart) OR . A paper copy in the mail If you have any lab test that is abnormal or we need to change your treatment, we will call you to review the results.   Testing/Procedures: Not needed   Follow-Up: At [facility], you and your health needs are our priority.  As part of our continuing mission to provide you with exceptional heart care, we have created designated Provider Care Teams.  These Care Teams include your primary Cardiologist (physician) and Advanced Practice Providers (APPs -  Physician Assistants and Nurse Practitioners) who all work together to provide you with the care you need, when you need it.  We recommend signing up for the patient portal called "MyChart".  Sign up information is provided on this After Visit Summary.  MyChart is used to connect with patients for Virtual Visits (Telemedicine).  Patients are able to view lab/test results, encounter notes, upcoming appointments, etc.  Non-urgent messages can be sent to your provider as well.   To learn more about what you can do with MyChart, go to [email or web address].    Your next appointment:   5 month(s)  The format for your next appointment:   In Person  Provider:   [name], MD   Other Instructions  you have clearance for  Hip surgery - do not take  Clopidogrel 7 days prior to  Surgery then restart after surgery.  No further testing  needed

## 2020-04-21 ENCOUNTER — Encounter: Payer: Self-pay | Admitting: Cardiology

## 2020-04-21 NOTE — Assessment & Plan Note (Signed)
Was recently seen by Dr. Quay Burow who also "cleared her for surgery ".  See below.  Basely would be class II to class III risk with her history for hip surgery.  No further testing required.  She has just had a Myoview and echocardiogram check.  Okay to hold Plavix 5-7 days preop surgery.  With big hip surgery likely would hold for 7 days.  PREOPERATIVE CARDIAC RISK ASSESSMENT   Revised Cardiac Risk Index:  High Risk Surgery: no; intermediate risk  Defined as Intraperitoneal, intrathoracic or suprainguinal vascular  Active CAD: no; recent Myoview with no ischemia  CHF: yes; diastolic dysfunction related CHF, well controlled-no active symptoms.  Cerebrovascular Disease: no; minimal carotid disease on Dopplers  Diabetes: yes; On Insulin: no  CKD (Cr >~ 2): no; 1.12  Total: ~1-2 Estimated Risk of Adverse Outcome: Class II-III risk Estimated Risk of MI, PE, VF/VT (Cardiac Arrest), Complete Heart Block: 6.6-10% -> Risk mitigated by a longstanding beta-blocker dose-reduced by 1/2:  3 0.3-5%   ACC/AHA Guidelines for "Clearance":  Step 1 - Need for Emergency Surgery: No: Semielective  If Yes - go straight to OR with perioperative surveillance  Step 2 - Active Cardiac Conditions (Unstable Angina, Decompensated HF, Significant  Arrhytmias - Complete HB, Mobitz II, Symptomatic VT or SVT, Severe Aortic Stenosis - mean gradient > 40 mmHg, Valve area < 1.0 cm2):   No:   If Yes - Evaluate & Treat per ACC/AHA Guidelines  Step 3 -  Low Risk Surgery: No: Intermediate  If Yes --> proceed to OR  If No --> Step 4  Step 4 - Functional Capacity >= 4 METS without symptoms: No: Not able to walk because of hip pain  If Yes --> proceed to OR  If No --> Step 5  Step 5 --  Clinical Risk Factors (CRF)   3 or more: No: 2  If Yes -- assess Surgical Risk, --   (High Risk Non-cardiac), Intraabdominal or thoracic vascular surgery consider testing if it will change  management.  Intermediate Risk: Proceed to OR with HR control, or consider testing if it will change management  1-2 or more CRFs: Yes  If Yes -- assess Surgical Risk, --   (High Risk Non-cardiac), Intraabdominal or thoracic vascular surgery --> Proceed to OR, or consider testing if it will change management.  No further testing will be required as it would not change management.  She is just had an echocardiogram and Myoview both relatively normal.  No need for further testing.  Intermediate Risk: Proceed to OR with HR control, or consider testing if it will change management

## 2020-04-21 NOTE — Assessment & Plan Note (Signed)
Distant history of MI but then most recently non-STEMI back in 2017 probably related to accelerated hypertension and existing CAD. Hyperdynamic LV with no obvious regional wall motion normalities.  Myoview read is low risk in 2019 and also 2021.  Does not seem to be having any active ischemic symptoms.

## 2020-04-21 NOTE — Assessment & Plan Note (Signed)
Blood pressures pretty high today.  I think she has never really fully understood the plan to increase her carvedilol dose. Plan: Increase carvedilol to 6.25 mg twice daily.  She did not understand to increase her dose after seeing Dr. Quay Burow. Anticipated probably we will need to further titrate either carvedilol or add ARB.

## 2020-04-21 NOTE — Assessment & Plan Note (Signed)
Another medication that she did not understand-was supposed to increase to 40 mg rosuvastatin.  This was not followed through on.  We reiterated the importance of increasing her dose.  She will take current 20 mg tablets 2 tablets a day and then increase to a full 40 mg.  I am not sure if she was actually taking 20 mg twice daily.  We will need to reassess lipids in about July.  If they have not changed, would probably need to consider additional therapy with either Zetia, Nexletol or even potentially PCSK9 inhibitor.

## 2020-04-21 NOTE — Assessment & Plan Note (Signed)
Being followed by Dr. Gwenlyn Found.  According to his history, the patient is not noting lifestyle limiting claudication on the right although ABIs are significantly reduced.  Plan is to allow her to have her hip surgery and then reassess when she is able to walk.

## 2020-04-21 NOTE — Assessment & Plan Note (Signed)
Certainly has normal EF is noted on both Myoview and echocardiogram.  She has hyperdynamic left ventricle from probably hypertensive heart disease, but left atrium is not overly dilated.  I suspect which she has accelerated hypertension, she would have acute diastolic heart failure symptoms.  Need more aggressive blood pressure control by increasing her carvedilol dose.  We will also restart Imdur.  Low threshold to consider additional blood pressure medications.  She is currently taking 40 mg daily Lasix and I have allowed her to free up off to take additional doses as needed for weight gain or edema.

## 2020-04-21 NOTE — Assessment & Plan Note (Signed)
History of CAD with patent grafts back in 2017.  She does have occasional nitroglycerin responsive chest discomfort but for some reason her Imdur was not restarted (or she did not understand to restart post hospitalization.  We will also have her restart at 60 mg daily.  A lot of the chest discomfort she is having is musculoskeletal in nature and not truly angina.  I think this last episode was probably more GERD or bloating related.  She felt better after taking ankles Alka-Seltzer.  She has had 2 stress test now in October 2019 and April 2021 that were both read as low risk.  I suspect the possible apical inferior defect noted on this recent Myoview was probably related to apical thinning seen on MRI.  Likely just a physiologic change with age.  I would be reluctant to check a new Myoview unless she has true anginal symptoms.  Most recent Myoview was ordered partly for preop evaluation.  Now with essentially totally normal cardiac evaluation with echo and Myoview, there should be no further requirement for preop testing for hip surgery.

## 2020-04-28 DIAGNOSIS — H33312 Horseshoe tear of retina without detachment, left eye: Secondary | ICD-10-CM | POA: Diagnosis not present

## 2020-04-28 DIAGNOSIS — H31092 Other chorioretinal scars, left eye: Secondary | ICD-10-CM | POA: Diagnosis not present

## 2020-04-28 DIAGNOSIS — H21343 Primary cyst of pars plana, bilateral: Secondary | ICD-10-CM | POA: Diagnosis not present

## 2020-05-01 ENCOUNTER — Telehealth: Payer: Self-pay | Admitting: Orthopaedic Surgery

## 2020-05-01 NOTE — Telephone Encounter (Signed)
Patient called asking for pain medication stronger. The pains medication she is currently taking does not touch the pain. Please send to pharmacy on file. Patient phone number is [phone]

## 2020-05-01 NOTE — Telephone Encounter (Signed)
Please advise 

## 2020-05-05 ENCOUNTER — Ambulatory Visit: Payer: Medicare Other | Admitting: Physician Assistant

## 2020-05-05 ENCOUNTER — Encounter (HOSPITAL_COMMUNITY): Payer: Self-pay | Admitting: Emergency Medicine

## 2020-05-05 ENCOUNTER — Emergency Department (HOSPITAL_COMMUNITY): Payer: Medicare Other

## 2020-05-05 ENCOUNTER — Other Ambulatory Visit: Payer: Self-pay

## 2020-05-05 ENCOUNTER — Emergency Department (HOSPITAL_COMMUNITY)
Admission: EM | Admit: 2020-05-05 | Discharge: 2020-05-05 | Disposition: A | Discharge status: Home / Self Care | Payer: Medicare Other | Attending: Emergency Medicine | Admitting: Emergency Medicine

## 2020-05-05 DIAGNOSIS — Z951 Presence of aortocoronary bypass graft: Secondary | ICD-10-CM | POA: Insufficient documentation

## 2020-05-05 DIAGNOSIS — R55 Syncope and collapse: Secondary | ICD-10-CM

## 2020-05-05 DIAGNOSIS — M542 Cervicalgia: Secondary | ICD-10-CM | POA: Insufficient documentation

## 2020-05-05 DIAGNOSIS — Z91040 Latex allergy status: Secondary | ICD-10-CM | POA: Diagnosis not present

## 2020-05-05 DIAGNOSIS — H538 Other visual disturbances: Secondary | ICD-10-CM | POA: Insufficient documentation

## 2020-05-05 DIAGNOSIS — R110 Nausea: Secondary | ICD-10-CM | POA: Diagnosis not present

## 2020-05-05 DIAGNOSIS — I252 Old myocardial infarction: Secondary | ICD-10-CM | POA: Diagnosis not present

## 2020-05-05 DIAGNOSIS — R52 Pain, unspecified: Secondary | ICD-10-CM | POA: Diagnosis not present

## 2020-05-05 DIAGNOSIS — I2510 Atherosclerotic heart disease of native coronary artery without angina pectoris: Secondary | ICD-10-CM | POA: Insufficient documentation

## 2020-05-05 DIAGNOSIS — Z743 Need for continuous supervision: Secondary | ICD-10-CM | POA: Diagnosis not present

## 2020-05-05 DIAGNOSIS — Z79899 Other long term (current) drug therapy: Secondary | ICD-10-CM | POA: Diagnosis not present

## 2020-05-05 DIAGNOSIS — I5032 Chronic diastolic (congestive) heart failure: Secondary | ICD-10-CM | POA: Insufficient documentation

## 2020-05-05 DIAGNOSIS — R42 Dizziness and giddiness: Secondary | ICD-10-CM | POA: Insufficient documentation

## 2020-05-05 DIAGNOSIS — Z7984 Long term (current) use of oral hypoglycemic drugs: Secondary | ICD-10-CM | POA: Insufficient documentation

## 2020-05-05 DIAGNOSIS — Z96641 Presence of right artificial hip joint: Secondary | ICD-10-CM | POA: Diagnosis not present

## 2020-05-05 DIAGNOSIS — I110 Hypertensive heart disease with heart failure: Secondary | ICD-10-CM | POA: Insufficient documentation

## 2020-05-05 DIAGNOSIS — N3000 Acute cystitis without hematuria: Secondary | ICD-10-CM

## 2020-05-05 DIAGNOSIS — I959 Hypotension, unspecified: Secondary | ICD-10-CM | POA: Diagnosis not present

## 2020-05-05 LAB — URINALYSIS, ROUTINE W REFLEX MICROSCOPIC
Bilirubin Urine: NEGATIVE
Glucose, UA: NEGATIVE mg/dL
Hgb urine dipstick: NEGATIVE
Ketones, ur: NEGATIVE mg/dL
Nitrite: NEGATIVE
Protein, ur: NEGATIVE mg/dL
Specific Gravity, Urine: 1.008 (ref 1.005–1.030)
pH: 6 (ref 5.0–8.0)

## 2020-05-05 LAB — I-STAT CHEM 8, ED
BUN: 19 mg/dL (ref 8–23)
Calcium, Ion: 1.17 mmol/L (ref 1.15–1.40)
Chloride: 102 mmol/L (ref 98–111)
Creatinine, Ser: 0.8 mg/dL (ref 0.44–1.00)
Glucose, Bld: 161 mg/dL — ABNORMAL HIGH (ref 70–99)
HCT: 36 % (ref 36.0–46.0)
Hemoglobin: 12.2 g/dL (ref 12.0–15.0)
Potassium: 4.3 mmol/L (ref 3.5–5.1)
Sodium: 138 mmol/L (ref 135–145)
TCO2: 29 mmol/L (ref 22–32)

## 2020-05-05 LAB — BASIC METABOLIC PANEL
Anion gap: 9 (ref 5–15)
BUN: 17 mg/dL (ref 8–23)
CO2: 27 mmol/L (ref 22–32)
Calcium: 9 mg/dL (ref 8.9–10.3)
Chloride: 101 mmol/L (ref 98–111)
Creatinine, Ser: 0.89 mg/dL (ref 0.44–1.00)
GFR calc Af Amer: >60 mL/min (ref >60)
GFR calc non Af Amer: 60 mL/min — ABNORMAL LOW (ref >60)
Glucose, Bld: 166 mg/dL — ABNORMAL HIGH (ref 70–99)
Potassium: 4.3 mmol/L (ref 3.5–5.1)
Sodium: 137 mmol/L (ref 135–145)

## 2020-05-05 LAB — HEPATIC FUNCTION PANEL
ALT: 20 U/L (ref 0–44)
AST: 22 U/L (ref 15–41)
Albumin: 3.4 g/dL — ABNORMAL LOW (ref 3.5–5.0)
Alkaline Phosphatase: 37 U/L — ABNORMAL LOW (ref 38–126)
Bilirubin, Direct: 0.1 mg/dL (ref 0.0–0.2)
Indirect Bilirubin: 0.9 mg/dL (ref 0.3–0.9)
Total Bilirubin: 1 mg/dL (ref 0.3–1.2)
Total Protein: 6.5 g/dL (ref 6.5–8.1)

## 2020-05-05 LAB — CBC WITH DIFFERENTIAL/PLATELET
Abs Immature Granulocytes: 0.03 10*3/uL (ref 0.00–0.07)
Basophils Absolute: 0 10*3/uL (ref 0.0–0.1)
Basophils Relative: 0 %
Eosinophils Absolute: 0.1 10*3/uL (ref 0.0–0.5)
Eosinophils Relative: 2 %
HCT: 37.2 % (ref 36.0–46.0)
Hemoglobin: 11.1 g/dL — ABNORMAL LOW (ref 12.0–15.0)
Immature Granulocytes: 0 %
Lymphocytes Relative: 15 %
Lymphs Abs: 1.2 10*3/uL (ref 0.7–4.0)
MCH: 28.4 pg (ref 26.0–34.0)
MCHC: 29.8 g/dL — ABNORMAL LOW (ref 30.0–36.0)
MCV: 95.1 fL (ref 80.0–100.0)
Monocytes Absolute: 0.6 10*3/uL (ref 0.1–1.0)
Monocytes Relative: 7 %
Neutro Abs: 6.2 10*3/uL (ref 1.7–7.7)
Neutrophils Relative %: 76 %
Platelets: 225 10*3/uL (ref 150–400)
RBC: 3.91 MIL/uL (ref 3.87–5.11)
RDW: 14.2 % (ref 11.5–15.5)
WBC: 8.2 10*3/uL (ref 4.0–10.5)
nRBC: 0 % (ref 0.0–0.2)

## 2020-05-05 LAB — PROTIME-INR
INR: 1 (ref 0.8–1.2)
Prothrombin Time: 13 seconds (ref 11.4–15.2)

## 2020-05-05 LAB — TSH: TSH: 1.158 u[IU]/mL (ref 0.350–4.500)

## 2020-05-05 LAB — TROPONIN I (HIGH SENSITIVITY): Troponin I (High Sensitivity): 9 ng/L (ref <18)

## 2020-05-05 LAB — BRAIN NATRIURETIC PEPTIDE: B Natriuretic Peptide: 65.2 pg/mL (ref 0.0–100.0)

## 2020-05-05 MED ORDER — LACTATED RINGERS IV BOLUS
500.0000 mL | Freq: Once | INTRAVENOUS | Status: AC
  Administered 2020-05-05: 500 mL via INTRAVENOUS

## 2020-05-05 MED ORDER — CEPHALEXIN 500 MG PO CAPS
500.0000 mg | ORAL_CAPSULE | Freq: Two times a day (BID) | ORAL | 0 refills | Status: AC
Start: 2020-05-05 — End: 2020-05-12

## 2020-05-05 MED ORDER — ACETAMINOPHEN 325 MG PO TABS
650.0000 mg | ORAL_TABLET | Freq: Once | ORAL | Status: DC
  Filled 2020-05-05: qty 2

## 2020-05-05 NOTE — ED Triage Notes (Signed)
Adding to triage RN's note - pt went to ortho appt for L hip pain. Became dizzy and diaphoretic waiting to be seen 80's SBP. Given 133ml's NS and 4mg  Zofran en route. Hx of CABG and DM, CBG 117. No cp on arrival, but did have R shoulder pain radiating to jaw w/EMS

## 2020-05-05 NOTE — ED Triage Notes (Addendum)
Pt here via [facility] from ortho after becoming diaphoretic while in the waiting room.  Initial bp was 99991111 systolic.  Pt ao x 4, but appears weak.  Pressure dropped to 68/48 en-route. cbg 117.

## 2020-05-05 NOTE — ED Provider Notes (Signed)
[facility] EMERGENCY DEPARTMENT Provider Note   CSN: [id number] Arrival date & time: 05/05/20  1445     History Chief Complaint  Patient presents with  . Hypotension    [name] is a 86 y.o. female.  HPI Patient is a 86 year old female who presents for near syncope.  History notable for CAD s/p CABG, DM, dCHF.  She reports she has felt well recently.  She has been dealing with left hip pain that is secondary to arthritic changes causing bone-on-bone physiology.  She has had a right hip replacement.  She is being worked up for possible left hip replacement.  She has not had any other pain.  Earlier today, she had a doctor's appointment at 1 PM.  While at the [place] office, she walked from one end of the building to the other during the registration process.  She asked for some water.  She sat down.  She states that at that point, her vision went blurry, and she felt dizzy and nauseated.  She said to the Programmer, applications that she felt sick.  She was taken back to an exam room.  Her blood pressure was found to be low with SBP in the 80s.  EMS was called.  Symptoms resolved prior to EMS arrival.  She states that since this time, she has felt tired and weak.  150 cc of IVF were given by EMS in addition to 4 mg of Zofran.  CBG was noted to be 117.  EMS noted a low blood pressure during transit (68/48).  Upon arrival in the ED, blood pressure is normal.  In addition to weakness and fatigue, patient endorses current symptom of right neck pain.  She denies any history of syncopal episodes.  She denies any earlier or current symptoms of chest pain, palpitations, abdominal pain, or any focal areas of numbness or weakness.    Past Medical History:  Diagnosis Date  . Allergy to IVP dye   . Atherosclerotic heart disease native coronary artery w/angina pectoris ([place]) 1992   a. 1992 s/p PTCA of LAD;  b. 1998 CABG x 3; c. 07/2001 Cath: Sev LM/LAD/LCX dzs, 3/3 patent grafts; d.  11/2013 Cath: stable graft anatomy; e. 10/2016 Cath: native 3VD, VG->OM nl, LIMA->LAD nl, VG->Diag 55.  . Atrial septal aneurysm   . Bradycardia, mild briefly to the 40s, asymptomatic 05/16/2013  . Carotid arterial disease ([place])    a. 05/2014 Carotid U/S: No signif bilat ICA stenosis, >60% L ECA.  Marland Kitchen Chronic anemia   . Chronic diastolic CHF (congestive heart failure) ([place])    a. 10/2016 Echo: Ef 55-60%, no rwma, Gr1 DD, triv AI, PASP 80mmHg, atrial septal aneurysm.  . DM (diabetes mellitus), type 2 with peripheral vascular complications (HCC)    On Oral medications.  . Essential hypertension   . GERD (gastroesophageal reflux disease)   . Hyperlipidemia with target LDL less than 70   . PAD (peripheral artery disease) ([place]) 05/2013; 09/2013   a. severe calcified POP-1 & TP Trunk Dz bilat;  b . 05/2013 Diamondback Rot Athrectomy (R POP) --> PTA R Pop, DES to R Peroneal;  c. 09/2013 PTA/Stenting of L Pop [name] - retrograde access);  d. 04/2014 ABI's: R/L: 1.1/1.1.  Marland Kitchen PUD (peptic ulcer disease)   . S/P CABG x 3 1998   LIMA-LAD, SVG-OM, SVG-D1  . Severe claudication ([place]) 05/2013   Referred for Peripheral Angio (Dr. Gwenlyn Found --> Dr. Brunetta Jeans in [place])    Patient  Active Problem List   Diagnosis Date Noted  . Preoperative cardiovascular examination 04/15/2020  . Chest pain 03/18/2020  . Unilateral primary osteoarthritis, left hip 04/03/2019  . History of right hip replacement 04/03/2019  . Status post total replacement of right hip 10/12/2018  . Unilateral primary osteoarthritis, right hip 09/12/2018  . Degenerative joint disease 05/30/2018  . Anal fissure 11/10/2017  . Rectal bleeding 11/10/2017  . Rectal pain 11/10/2017  . Inflammatory arthritis 10/13/2017  . Latent tuberculosis by blood test 10/13/2017  . Chronic diastolic CHF (congestive heart failure), NYHA class 2 ([place]) 08/11/2017  . Fatigue due to treatment 01/09/2017  . NSTEMI (non-ST elevated myocardial infarction) ([place]) 11/01/2016   . Diabetes mellitus ([place]) 10/24/2015  . Non-insulin dependent type 2 diabetes mellitus ([place]) 10/24/2015  . Angina, class I ([place]) 04/22/2015  . DOE (dyspnea on exertion) 04/22/2015  . Bilateral leg edema 04/22/2015  . Accumulation of fluid in tissues 03/21/2014  . Vertigo, central 01/02/2014  . Vertigo 01/02/2014  . Limb pain- echymosis, pain Lt radial artery after cath 12/11/2013  . Dyslipidemia, goal LDL below 70 05/17/2013  . Essential hypertension 05/17/2013  . Claudication ([place]) 05/16/2013  . PTA/ Stent Rt popliteal and Rt peroneal artery 05/16/13 05/16/2013  . PAD,severe calcified pop and tibial peroneal trunk disease bilateraly   . ABDOMINAL PAIN RIGHT LOWER QUADRANT 10/07/2010  . CVA-STROKE 03/04/2010  . BARRETT'S ESOPHAGUS 03/04/2010  . FECAL INCONTINENCE 03/04/2010  . DM 01/14/2010  . Anemia of chronic disease 01/14/2010  . Atherosclerotic heart disease of native coronary artery with angina pectoris ([place]) 01/14/2010  . DIVERTICULOSIS OF COLON 01/14/2010  . PERSONAL HISTORY OF COLONIC POLYPS 01/14/2010  . [place] ALLERGY 01/14/2010    Past Surgical History:  Procedure Laterality Date  . ATHERECTOMY Right 05/15/2013   Procedure: ATHERECTOMY;  Surgeon: Lorretta Harp, MD;  Location: [facility] CATH LAB;  Service: Cardiovascular;  Laterality: Right;  popliteal  . BACK SURGERY    . CARDIAC CATHETERIZATION  07/17/01   2 v CAD with LM, circ, LAD, obtuse diag, mod stenosis of diag vein graft , mild stenosis of marg vein graft, nl EF, medical treatment  . CARDIAC CATHETERIZATION  11/2013   Widely patent LIMA-LAD & SVG-OM with mild progression of proximal stenosis ~40-50% in SVG-D1; Widely patent native RCA with known severe native LCA disease  . CARDIAC CATHETERIZATION N/A 11/01/2016   Procedure: Left Heart Cath and Cors/Grafts Angiography;  Surgeon: [name], MD;  Location: [facility] CV LAB;  Service: Cardiovascular: Hemodynamics: High LVEDP!.  Cors/Grafts: LM 55%, o-p LAD 100%  then after D1 -> LIMA-LAD patent, SVG-Diag ~55%. small RI wiht ~70% ostial. O-p Cx 80% - pCx 70% --> SVG-bifurcating OM patent. -- med Rx  . CORONARY ANGIOPLASTY  1992   PCI to LAD  . CORONARY ARTERY BYPASS GRAFT  1998   LIMA-LAD, SVG-diagonal (none roughly 50% stenosis), SVG-OM  . LEFT HEART CATHETERIZATION WITH CORONARY ANGIOGRAM N/A 11/27/2013   Procedure: LEFT HEART CATHETERIZATION WITH CORONARY ANGIOGRAM;  Surgeon: [name], MD;  Location: [facility] CATH LAB;  Service: Cardiovascular;  Laterality: N/A;  . LOW EXTREMITY DOPPLERS/ABI  10/2016   Patent right SFA, popliteal and peroneal tendons. Patent left SFA and popliteal stent. 30-50% stenosis in the right common femoral and popliteal arteries, 50-74% stenosis in left profunda femoris artery. --> 1 year follow-up.  RABI - 1.2, LABI 1.1  . LOWER EXTREMITY ANGIOGRAM N/A 02/22/2013   Procedure: LOWER EXTREMITY ANGIOGRAM;  Surgeon: Lorretta Harp, MD;  Location: [facility] CATH LAB;  Service: Cardiovascular;  Laterality: N/A;  . LOWER EXTREMITY ANGIOGRAM N/A 07/20/2013   Procedure: LOWER EXTREMITY ANGIOGRAM;  Surgeon: Lorretta Harp, MD;  Location[facility] CATH LAB;  Service: Cardiovascular;  Laterality: N/A;  . NM MYOVIEW LTD  04/03/2013; 5/26'/2016   Lexiscan: a)  Apical perfusion defect with no ischemia. Consider prior infarct versus breast attenuation.;; b) 5/'16: LOW RISK, Nl EF ~55-65%, No Ischemia /Infarction  . NM MYOVIEW LTD  10/'19; 4/'21   a) LOW RISK.  EF 55%.  No ischemia or infarction. b) EF estimated 81%.  Suboptimal images.  May be subtle inferior ischemia-initially read by radiology as intermediate risk, however overread by cardiology to be mild low risk..  . PERCUTANEOUS STENT INTERVENTION Right 05/15/2013   Procedure: PERCUTANEOUS STENT INTERVENTION;  Surgeon: Lorretta Harp, MD;  Location: [facility] CATH LAB;  Service: Cardiovascular;  Laterality: Right;  tibioperoneal trunk  . Peroneal Artery Stent  05/15/13   Diamondback rot. atherectomy of high  grade segmental popliteal stenosis then Chocolate balloonPTA; then angiosculpt PTA of the prox peroneal with stent-Xpedition   . pv angio  07/20/13   high-grade calcified popliteal disease with one-vessel runoff via a small anterior tibial artery that has proximal disease as well, no intervention  . TOTAL HIP ARTHROPLASTY Right 10/12/2018   Procedure: RIGHT TOTAL HIP ARTHROPLASTY ANTERIOR APPROACH;  Surgeon: Mcarthur Rossetti, MD;  Location: [facility] ORS;  Service: Orthopedics;  Laterality: Right;  . TRANSTHORACIC ECHOCARDIOGRAM  11/'17; 10/19   a) EF 55-60%. Gr 1 DD. Normal PAP. Aortic Sclerosis.;; b) Normal LV size and function with vigorous EF 65 to 70%.  GR 1 DD.  Severe LA dilation (suggestive of probably worsening: GR 1 DD).  . TRANSTHORACIC ECHOCARDIOGRAM  03/18/2020   EF 70-75%.  Hyperdynamic.  GR 1 DD.  No R WMA.  Mild LA dilation.  Mild aortic valve sclerosis but no stenosis.-No change from prior echo  . TUBAL LIGATION       OB History   No obstetric history on file.     Family History  Problem Relation Age of Onset  . Hypertension Mother   . Hyperlipidemia Mother   . Hyperlipidemia Father   . Hypertension Father     Social History   Tobacco Use  . Smoking status: Never Smoker  . Smokeless tobacco: Never Used  Substance Use Topics  . Alcohol use: No    Comment: rare glass of wine   . Drug use: No    Home Medications Prior to Admission medications   Medication Sig Start Date End Date Taking? Authorizing Provider  acetaminophen (TYLENOL) 500 MG tablet Take 1 tablet (500 mg total) by mouth daily as needed for moderate pain. LEG PAIN. PLEASE DISCUSS STARTING A MEDICATION FOR PAIN MANAGEMENT WITH YOUR PCP. Patient taking differently: Take 500 mg by mouth daily as needed for moderate pain.  06/26/19  Yes Lorretta Harp, MD  acetaminophen-codeine (TYLENOL #3) 300-30 MG tablet Take 1-2 tablets by mouth every 8 (eight) hours as needed for moderate pain. 04/10/20  Yes  Mcarthur Rossetti, MD  carvedilol (COREG) 6.25 MG tablet Take 1 tablet (6.25 mg total) by mouth 2 (two) times daily. 04/08/20 07/07/20 Yes Lorretta Harp, MD  Cholecalciferol (VITAMIN D3) 10 MCG (400 UNIT) tablet Take 400 Units by mouth daily.   Yes [provider]  clopidogrel (PLAVIX) 75 MG tablet Take 75 mg by mouth daily.   Yes [provider]  Coenzyme Q10 (CO Q  10 PO) Take 1 tablet by mouth every other day.    Yes [provider]  cyanocobalamin 1000 MCG tablet Take 1,000 mcg by mouth daily.   Yes [provider]  ezetimibe (ZETIA) 10 MG tablet Take 10 mg by mouth daily.   Yes [provider]  folic acid (FOLVITE) 1 MG tablet Take 1 mg by mouth daily. 08/21/18  Yes [provider]  furosemide (LASIX) 40 MG tablet Take 40 Mg (1 tablet) daily Patient taking differently: Take 20 mg by mouth daily.  03/04/20  Yes Lorretta Harp, MD  gabapentin (NEURONTIN) 300 MG capsule TAKE 1 CAPSULE BY MOUTH THREE TIMES A DAY Patient taking differently: Take 300 mg by mouth 3 (three) times daily.  01/01/19  Yes Regal, Tamala Fothergill, DPM  glimepiride (AMARYL) 1 MG tablet Take 1 mg by mouth daily with breakfast.  05/02/18  Yes [provider]  isosorbide mononitrate (IMDUR) 60 MG 24 hr tablet TAKE 1.5 TABLETS (90 MG TOTAL) BY MOUTH DAILY. Marland Kitchen Patient taking differently: Take 60 mg by mouth daily. . 12/21/19  Yes Lorretta Harp, MD  LORazepam (ATIVAN) 0.5 MG tablet Take 0.5 mg by mouth every 6 (six) hours as needed for anxiety or sleep.  03/17/18  Yes [provider]  methocarbamol (ROBAXIN) 500 MG tablet Take 1 tablet (500 mg total) by mouth every 8 (eight) hours as needed for muscle spasms. 03/19/20  Yes Lavina Hamman, MD  methotrexate (RHEUMATREX) 2.5 MG tablet Take 20 mg by mouth once a week. Every Monday of each week 08/21/18  Yes [provider]  nitroGLYCERIN (NITROSTAT) 0.4 MG SL tablet PLACE 1 TABLET (0.4 MG TOTAL) UNDER THE  TONGUE EVERY 5 (FIVE) MINUTES AS NEEDED FOR CHEST PAIN. 02/13/18  Yes Theora Gianotti, NP  pantoprazole (PROTONIX) 40 MG tablet Take 1 tablet (40 mg total) by mouth 2 (two) times daily before a meal. 03/19/20  Yes Lavina Hamman, MD  predniSONE (DELTASONE) 5 MG tablet Take 5 mg by mouth daily with breakfast.    Yes [provider]  rosuvastatin (CRESTOR) 40 MG tablet Take 1 tablet (40 mg total) by mouth at bedtime. Patient taking differently: Take 20 mg by mouth daily.  04/08/20  Yes Lorretta Harp, MD  sulfaSALAzine (AZULFIDINE) 500 MG EC tablet Take 500 mg by mouth 2 (two) times daily.  05/29/18  Yes [provider]  cephALEXin (KEFLEX) 500 MG capsule Take 1 capsule (500 mg total) by mouth 2 (two) times daily for 7 days. 05/05/20 05/12/20  Godfrey Pick, MD  traMADol (ULTRAM) 50 MG tablet Take 1 tablet (50 mg total) by mouth every 8 (eight) hours as needed for severe pain. Patient not taking: Reported on 05/05/2020 02/14/20   Pete Pelt, PA-C    Allergies    Fish allergy, Ivp dye [iodinated diagnostic agents], Latex, Shellfish allergy, Shellfish-derived products, Other, and Metformin  Review of Systems   Review of Systems  Constitutional: Positive for fatigue. Negative for activity change, appetite change, chills and fever.  HENT: Negative.  Negative for ear pain and sore throat.   Eyes: Positive for visual disturbance. Negative for pain.  Respiratory: Negative.  Negative for cough and shortness of breath.   Cardiovascular: Negative.  Negative for chest pain, palpitations and leg swelling.  Gastrointestinal: Positive for nausea. Negative for abdominal pain, constipation, diarrhea and vomiting.  Genitourinary: Positive for dysuria (Intermittent). Negative for hematuria, pelvic pain and urgency.  Musculoskeletal: Negative for arthralgias, back pain, myalgias  and neck pain.  Skin: Negative for color change, pallor, rash and wound.  Neurological: Positive for  dizziness, tremors (RUE, chronic), syncope (Near syncope), weakness (Generalized) and light-headedness. Negative for seizures, facial asymmetry, speech difficulty, numbness and headaches.  Hematological: Negative.   Psychiatric/Behavioral: Negative.  Negative for confusion and decreased concentration.  All other systems reviewed and are negative.   Physical Exam Updated Vital Signs BP (!) 184/63   Pulse (!) 57   Temp 98.1 F (36.7 C) (Oral)   Resp 17   Wt 73.5 kg   SpO2 98%   BMI 28.70 kg/m   Physical Exam Vitals and nursing note reviewed.  Constitutional:      General: She is not in acute distress.    Appearance: Normal appearance. She is well-developed and normal weight. She is not ill-appearing, toxic-appearing or diaphoretic.  HENT:     Head: Normocephalic and atraumatic.     Right Ear: External ear normal.     Left Ear: External ear normal.     Nose: Nose normal.     Mouth/Throat:     Mouth: Mucous membranes are moist.     Pharynx: Oropharynx is clear.  Eyes:     General: No visual field deficit or scleral icterus.    Extraocular Movements: Extraocular movements intact.     Conjunctiva/sclera: Conjunctivae normal.     Pupils: Pupils are equal, round, and reactive to light.  Cardiovascular:     Rate and Rhythm: Normal rate and regular rhythm.     Heart sounds: No murmur. No gallop.   Pulmonary:     Effort: Pulmonary effort is normal. No respiratory distress.     Breath sounds: Normal breath sounds. No wheezing or rales.  Chest:     Chest wall: No tenderness.  Abdominal:     General: There is no distension.     Palpations: Abdomen is soft.     Tenderness: There is no abdominal tenderness. There is no right CVA tenderness, left CVA tenderness or guarding.  Musculoskeletal:        General: No tenderness or deformity. Normal range of motion.     Cervical back: Normal range of motion and neck supple. No rigidity or tenderness.     Right lower leg: Edema present.      Left lower leg: Edema present.  Skin:    General: Skin is warm and dry.     Coloration: Skin is not jaundiced or pale.     Findings: No bruising.  Neurological:     General: No focal deficit present.     Mental Status: She is alert and oriented to person, place, and time.     Cranial Nerves: Cranial nerves are intact. No cranial nerve deficit, dysarthria or facial asymmetry.     Sensory: Sensation is intact. No sensory deficit.     Motor: Tremor (Right arm, chronic) present. No weakness, abnormal muscle tone or pronator drift.     Coordination: Coordination is intact. Romberg sign negative. Coordination normal. Finger-Nose-Finger Test normal.  Psychiatric:        Mood and Affect: Mood normal.        Behavior: Behavior normal.        Thought Content: Thought content normal.        Judgment: Judgment normal.     ED Results / Procedures / Treatments   Labs (all labs ordered are listed, but only abnormal results are displayed) Labs Reviewed  BASIC METABOLIC PANEL - Abnormal; Notable for the following  components:      Result Value   Glucose, Bld 166 (*)    GFR calc non Af Amer 60 (*)    All other components within normal limits  CBC WITH DIFFERENTIAL/PLATELET - Abnormal; Notable for the following components:   Hemoglobin 11.1 (*)    MCHC 29.8 (*)    All other components within normal limits  URINALYSIS, ROUTINE W REFLEX MICROSCOPIC - Abnormal; Notable for the following components:   Leukocytes,Ua MODERATE (*)    Bacteria, UA MANY (*)    All other components within normal limits  HEPATIC FUNCTION PANEL - Abnormal; Notable for the following components:   Albumin 3.4 (*)    Alkaline Phosphatase 37 (*)    All other components within normal limits  I-STAT CHEM 8, ED - Abnormal; Notable for the following components:   Glucose, Bld 161 (*)    All other components within normal limits  URINE CULTURE  PROTIME-INR  BRAIN NATRIURETIC PEPTIDE  TSH  TROPONIN I (HIGH SENSITIVITY)    TROPONIN I (HIGH SENSITIVITY)    EKG EKG Interpretation  Date/Time:  Monday May 05 2020 14:54:10 EDT Ventricular Rate:  56 PR Interval:    QRS Duration: 96 QT Interval:  426 QTC Calculation: 412 R Axis:   65 Text Interpretation: Sinus rhythm Nonspecific T abnrm, anterolateral leads When compared to priorl t wave inversions in leads V3-V6 now upright. No STEMI Confirmed by Antony Blackbird [phone]) on 05/05/2020 3:04:08 PM   Radiology CT Head Wo Contrast  Result Date: 05/05/2020 CLINICAL DATA:  [place] EXAM: CT HEAD WITHOUT CONTRAST TECHNIQUE: Contiguous axial images were obtained from the base of the skull through the vertex without intravenous contrast. COMPARISON:  MRI brain 11/17/2019, CT brain 08/20/2014 FINDINGS: Brain: No acute territorial infarction, hemorrhage, or intracranial mass. Minimal hypodensity in the white matter consistent with chronic small vessel ischemic change. Stable ventricle size Vascular: No hyperdense vessels. Vertebral and carotid vascular calcifications. Skull: Normal. Negative for fracture or focal lesion. Sinuses/Orbits: Postsurgical changes of the left ethmoidal maxillary sinus. Mucosal thickening with calcifications within the inferior left maxillary sinus. Other: None IMPRESSION: 1. No CT evidence for acute intracranial abnormality. 2. Mild chronic small vessel ischemic change of the white matter Electronically Signed   By: Donavan Foil M.D.   On: 05/05/2020 18:41   DG Chest Port 1 View  Result Date: 05/05/2020 CLINICAL DATA:  Near syncope. EXAM: PORTABLE CHEST 1 VIEW COMPARISON:  None. FINDINGS: Multiple sternal wires and vascular clips are seen. Decreased lung volumes are noted, likely secondary to the degree of patient inspiration. There is no evidence of acute infiltrate, pleural effusion or pneumothorax. The heart size and mediastinal contours are within normal limits. Multilevel degenerative changes seen throughout the thoracic spine. IMPRESSION: 1.  Evidence of prior median sternotomy/CABG. 2. No acute or active cardiopulmonary disease. Electronically Signed   By: Virgina Norfolk M.D.   On: 05/05/2020 16:44    Procedures Procedures (including critical care time)  Medications Ordered in ED Medications  lactated ringers bolus 500 mL (0 mLs Intravenous Stopped 05/05/20 1709)    ED Course  I have reviewed the triage vital signs and the nursing notes.  Pertinent labs & imaging results that were available during my care of the patient were reviewed by me and considered in my medical decision making (see chart for details).    MDM Rules/Calculators/A&P                      Patient  is a 86 year old female who presents for transient episode of near syncope.  This afternoon, she was at her orthopedic clinic when she experienced blurry vision, nausea, and weakness.  Her blood pressure was immediately checked and found to be in the [id number] systolic.  EMS was called.  She remembers the entire episode.  Prior to EMS arrival on scene, her symptoms had improved.  EMS provided 150 cc of IVF and 4 mg of Zofran.  Upon arrival in the ED, she reports feeling tired with generalized weakness.  She denies any other current symptoms.  Blood pressure has normalized.  On exam, she has no focal neurologic deficits.  Labs ordered to assess for underlying metabolic/infectious etiologies.  CT of head was ordered.  Patient was given 500 cc bolus of IVF.  She was kept on cardiac monitor to continuously assess vital signs and cardiac rhythm.  EKG showed sinus rhythm, narrow QRS complexes, no ST segment changes, normal PR, normal QTC.  Patient's heart rate was in the low range of normal.  There were T wave inversions/flattening in anteroseptal leads.  This is consistent with prior EKGs with even some improvement from prior EKGs.  Serum labs, including troponins were unremarkable.  CT of head showed no acute intracranial abnormality.  Patient was observed in the ED without  any recurrence of symptoms.  Blood pressure remained normal and even became elevated during her stay in the ED.  Given the patient's concomitant hypotension at time of symptoms, do not suspect TIA.  History is more consistent with near syncope.  Reassuring factors include the following: Nonexertional, presence of prodrome, no actual loss of consciousness, absence of chest pains/shortness of breath/palpitations, no recurrence while in the ED, reassuring EKG, and normal troponins.  Urine study showed evidence of acute infection.  This is consistent with what the patient describes as intermittent dysuria.  Urine culture was sent.  Patient was prescribed Keflex for empiric treatment of UTI.  She was advised to return to the ED if she experienced any recurrence of the symptoms she experienced earlier today.  She was discharged home in stable condition.  Final Clinical Impression(s) / ED Diagnoses Final diagnoses:  Near syncope  Acute cystitis without hematuria    Rx / DC Orders ED Discharge Orders         Ordered    cephALEXin (KEFLEX) 500 MG capsule  2 times daily     05/05/20 2203           Godfrey Pick, MD 05/06/20 0439    Tegeler, Gwenyth Allegra, MD 05/06/20 1251

## 2020-05-08 ENCOUNTER — Telehealth: Payer: Self-pay | Admitting: Orthopaedic Surgery

## 2020-05-08 LAB — URINE CULTURE: Culture: >=100000 — AB

## 2020-05-08 NOTE — Telephone Encounter (Signed)
Patient called and stated Monica K. called and she was returning call. Patient states it takes her a while to get to the phone and to please try to call twice. Patient is asking for  Call back from [place]. Patient phone nu,mber is [phone].

## 2020-05-09 ENCOUNTER — Telehealth: Payer: Self-pay | Admitting: Emergency Medicine

## 2020-05-09 DIAGNOSIS — N920 Excessive and frequent menstruation with regular cycle: Secondary | ICD-10-CM | POA: Diagnosis not present

## 2020-05-09 DIAGNOSIS — R1030 Lower abdominal pain, unspecified: Secondary | ICD-10-CM | POA: Diagnosis not present

## 2020-05-09 DIAGNOSIS — N3001 Acute cystitis with hematuria: Secondary | ICD-10-CM | POA: Diagnosis not present

## 2020-05-09 NOTE — Telephone Encounter (Signed)
Post ED Visit - Positive Culture Follow-up  Culture report reviewed by antimicrobial stewardship pharmacist: [place] Team []  Elenor Quinones, Pharm.D. []  Heide Guile, Pharm.D., BCPS AQ-ID []  Parks Neptune, Pharm.D., BCPS []  Alycia Rossetti, Pharm.D., BCPS []  Cisco, Pharm.D., BCPS, AAHIVP []  Legrand Como, Pharm.D., BCPS, AAHIVP []  Salome Arnt, PharmD, BCPS []  Johnnette Gourd, PharmD, BCPS []  Hughes Better, PharmD, BCPS [x]  Acey Lav, PharmD []  Laqueta Linden, PharmD, BCPS []  Albertina Parr, PharmD  [place] Team []  Leodis Sias, PharmD []  Lindell Spar, PharmD []  Royetta Asal, PharmD []  Graylin Shiver, Rph []  Rema Fendt) Glennon Mac, PharmD []  Arlyn Dunning, PharmD []  Netta Cedars, PharmD []  Dia Sitter, PharmD []  Leone Haven, PharmD []  Gretta Arab, PharmD []  Theodis Shove, PharmD []  Peggyann Juba, PharmD []  Reuel Boom, PharmD   Positive urine culture Treated with Cephalexin, organism sensitive to the same and no further patient follow-up is required at this time.  Sandi Raveling Nick Armel 05/09/2020, 11:23 AM

## 2020-05-09 NOTE — Telephone Encounter (Signed)
I have not called this patient, nor has Dr. Junius Roads ever seen her. This is Dr. Trevor Mace patient.  Are you sure this message is on the right patient? Please call her and find out what the patient is needing and get the message to the appropriate person.

## 2020-05-14 ENCOUNTER — Telehealth: Payer: Self-pay | Admitting: Orthopaedic Surgery

## 2020-05-14 NOTE — Telephone Encounter (Signed)
Patient called asked if she can get something for pain. Patient said she is hurting from her hip to her foot. The number to contact patient is [phone]

## 2020-05-15 MED ORDER — ACETAMINOPHEN-CODEINE #3 300-30 MG PO TABS: 1.0000 | ORAL_TABLET | Freq: Three times a day (TID) | ORAL | 0 refills | Status: DC | PRN

## 2020-05-15 NOTE — Telephone Encounter (Signed)
I sent in some tylenol#3. 

## 2020-05-15 NOTE — Telephone Encounter (Signed)
Please advise 

## 2020-05-23 DIAGNOSIS — B351 Tinea unguium: Secondary | ICD-10-CM | POA: Diagnosis not present

## 2020-05-23 DIAGNOSIS — E119 Type 2 diabetes mellitus without complications: Secondary | ICD-10-CM | POA: Diagnosis not present

## 2020-05-23 DIAGNOSIS — M79674 Pain in right toe(s): Secondary | ICD-10-CM | POA: Diagnosis not present

## 2020-05-23 DIAGNOSIS — L600 Ingrowing nail: Secondary | ICD-10-CM | POA: Diagnosis not present

## 2020-06-04 ENCOUNTER — Ambulatory Visit (INDEPENDENT_AMBULATORY_CARE_PROVIDER_SITE_OTHER): Payer: Medicare Other | Admitting: Orthopaedic Surgery

## 2020-06-04 ENCOUNTER — Encounter: Payer: Self-pay | Admitting: Orthopaedic Surgery

## 2020-06-04 ENCOUNTER — Other Ambulatory Visit: Payer: Self-pay

## 2020-06-04 VITALS — Ht 63.0 in | Wt 162.0 lb

## 2020-06-04 DIAGNOSIS — I6523 Occlusion and stenosis of bilateral carotid arteries: Secondary | ICD-10-CM

## 2020-06-04 DIAGNOSIS — M25552 Pain in left hip: Secondary | ICD-10-CM

## 2020-06-04 DIAGNOSIS — M1612 Unilateral primary osteoarthritis, left hip: Secondary | ICD-10-CM | POA: Diagnosis not present

## 2020-06-04 NOTE — Progress Notes (Signed)
Patient is well-known to me.  She is a very active 86 year old female with debilitating arthritis involving her left hip.  We have actually replaced her right hip.  Her left hip x-rays earlier this year showed complete loss of joint space with actually severe impingement of the femoral head on the acetabulum.  There is cystic changes as well.  She embolus with a cane.  At this point her pain is unbearable and it is 10 out of 10 with her left hip.  At this point her left hip pain is definitely affecting her mobility, her quality of life and her actives of daily living.  We did see her last month but we had to send her to the emergency room due to dizziness and some hypotension.  It ended up being a urinary tract infection for which she has now been treated for.  Her primary care physician is sending her to a urologist.  She denies any fever, chills, nausea, vomiting today.  She is alert and oriented x3 and in no acute distress.  She is looking good overall other than severe left hip pain.  She is ambulate with a cane.  On exam her left hip has almost essentially no rotation to it with severe pain on attempts of rotation.  Her right operative total hip moves smoothly and fluidly.  At this point I am comfortable with setting up for hip replacement surgery for her left hip given the x-ray findings and her clinical exam findings combined with the failure of conservative treatment.  I believe medically she is stable for this surgery as well.  We will work on getting this scheduled in the near future.  All questions and concerns were answered and addressed.

## 2020-06-11 DIAGNOSIS — R8279 Other abnormal findings on microbiological examination of urine: Secondary | ICD-10-CM | POA: Diagnosis not present

## 2020-06-18 ENCOUNTER — Encounter (HOSPITAL_COMMUNITY): Payer: Medicare Other

## 2020-06-20 ENCOUNTER — Telehealth: Payer: Self-pay | Admitting: Orthopaedic Surgery

## 2020-06-20 MED ORDER — ACETAMINOPHEN-CODEINE #3 300-30 MG PO TABS: 1.0000 | ORAL_TABLET | Freq: Three times a day (TID) | ORAL | 0 refills | Status: DC | PRN

## 2020-06-20 NOTE — Telephone Encounter (Signed)
Please advise 

## 2020-06-20 NOTE — Telephone Encounter (Signed)
Patient called needing Rx refilled (Tylenol #3) The number to contact patient is [phone]

## 2020-07-07 DIAGNOSIS — E559 Vitamin D deficiency, unspecified: Secondary | ICD-10-CM | POA: Diagnosis not present

## 2020-07-07 DIAGNOSIS — E1169 Type 2 diabetes mellitus with other specified complication: Secondary | ICD-10-CM | POA: Diagnosis not present

## 2020-07-07 DIAGNOSIS — E118 Type 2 diabetes mellitus with unspecified complications: Secondary | ICD-10-CM | POA: Diagnosis not present

## 2020-07-07 DIAGNOSIS — I10 Essential (primary) hypertension: Secondary | ICD-10-CM | POA: Diagnosis not present

## 2020-07-07 DIAGNOSIS — M1990 Unspecified osteoarthritis, unspecified site: Secondary | ICD-10-CM | POA: Diagnosis not present

## 2020-07-10 ENCOUNTER — Emergency Department (HOSPITAL_COMMUNITY): Payer: Medicare Other

## 2020-07-10 ENCOUNTER — Other Ambulatory Visit: Payer: Self-pay

## 2020-07-10 ENCOUNTER — Emergency Department (HOSPITAL_COMMUNITY)
Admission: EM | Admit: 2020-07-10 | Discharge: 2020-07-10 | Disposition: A | Discharge status: Home / Self Care | Payer: Medicare Other | Attending: Emergency Medicine | Admitting: Emergency Medicine

## 2020-07-10 ENCOUNTER — Encounter (HOSPITAL_COMMUNITY): Payer: Self-pay | Admitting: Emergency Medicine

## 2020-07-10 ENCOUNTER — Ambulatory Visit: Payer: Medicare Other | Admitting: Cardiology

## 2020-07-10 DIAGNOSIS — I5032 Chronic diastolic (congestive) heart failure: Secondary | ICD-10-CM | POA: Diagnosis not present

## 2020-07-10 DIAGNOSIS — N3000 Acute cystitis without hematuria: Secondary | ICD-10-CM | POA: Diagnosis not present

## 2020-07-10 DIAGNOSIS — I110 Hypertensive heart disease with heart failure: Secondary | ICD-10-CM | POA: Insufficient documentation

## 2020-07-10 DIAGNOSIS — E119 Type 2 diabetes mellitus without complications: Secondary | ICD-10-CM | POA: Insufficient documentation

## 2020-07-10 DIAGNOSIS — Z96641 Presence of right artificial hip joint: Secondary | ICD-10-CM | POA: Diagnosis not present

## 2020-07-10 DIAGNOSIS — R55 Syncope and collapse: Secondary | ICD-10-CM

## 2020-07-10 DIAGNOSIS — Z91040 Latex allergy status: Secondary | ICD-10-CM | POA: Diagnosis not present

## 2020-07-10 DIAGNOSIS — Z743 Need for continuous supervision: Secondary | ICD-10-CM | POA: Diagnosis not present

## 2020-07-10 DIAGNOSIS — R6889 Other general symptoms and signs: Secondary | ICD-10-CM | POA: Diagnosis not present

## 2020-07-10 DIAGNOSIS — R42 Dizziness and giddiness: Secondary | ICD-10-CM | POA: Diagnosis not present

## 2020-07-10 DIAGNOSIS — Z7984 Long term (current) use of oral hypoglycemic drugs: Secondary | ICD-10-CM | POA: Insufficient documentation

## 2020-07-10 DIAGNOSIS — R404 Transient alteration of awareness: Secondary | ICD-10-CM | POA: Diagnosis not present

## 2020-07-10 DIAGNOSIS — R52 Pain, unspecified: Secondary | ICD-10-CM | POA: Diagnosis not present

## 2020-07-10 DIAGNOSIS — I25110 Atherosclerotic heart disease of native coronary artery with unstable angina pectoris: Secondary | ICD-10-CM | POA: Diagnosis not present

## 2020-07-10 DIAGNOSIS — Z951 Presence of aortocoronary bypass graft: Secondary | ICD-10-CM | POA: Insufficient documentation

## 2020-07-10 LAB — CBC WITH DIFFERENTIAL/PLATELET
Abs Immature Granulocytes: 0.02 10*3/uL (ref 0.00–0.07)
Basophils Absolute: 0 10*3/uL (ref 0.0–0.1)
Basophils Relative: 0 %
Eosinophils Absolute: 0.2 10*3/uL (ref 0.0–0.5)
Eosinophils Relative: 3 %
HCT: 41.1 % (ref 36.0–46.0)
Hemoglobin: 12.4 g/dL (ref 12.0–15.0)
Immature Granulocytes: 0 %
Lymphocytes Relative: 23 %
Lymphs Abs: 1.3 10*3/uL (ref 0.7–4.0)
MCH: 27.7 pg (ref 26.0–34.0)
MCHC: 30.2 g/dL (ref 30.0–36.0)
MCV: 91.7 fL (ref 80.0–100.0)
Monocytes Absolute: 0.6 10*3/uL (ref 0.1–1.0)
Monocytes Relative: 11 %
Neutro Abs: 3.6 10*3/uL (ref 1.7–7.7)
Neutrophils Relative %: 63 %
Platelets: 268 10*3/uL (ref 150–400)
RBC: 4.48 MIL/uL (ref 3.87–5.11)
RDW: 13.5 % (ref 11.5–15.5)
WBC: 5.8 10*3/uL (ref 4.0–10.5)
nRBC: 0 % (ref 0.0–0.2)

## 2020-07-10 LAB — BASIC METABOLIC PANEL
Anion gap: 13 (ref 5–15)
BUN: 12 mg/dL (ref 8–23)
CO2: 27 mmol/L (ref 22–32)
Calcium: 9.2 mg/dL (ref 8.9–10.3)
Chloride: 100 mmol/L (ref 98–111)
Creatinine, Ser: 1.08 mg/dL — ABNORMAL HIGH (ref 0.44–1.00)
GFR calc Af Amer: 55 mL/min — ABNORMAL LOW (ref >60)
GFR calc non Af Amer: 47 mL/min — ABNORMAL LOW (ref >60)
Glucose, Bld: 193 mg/dL — ABNORMAL HIGH (ref 70–99)
Potassium: 4.5 mmol/L (ref 3.5–5.1)
Sodium: 140 mmol/L (ref 135–145)

## 2020-07-10 LAB — CBG MONITORING, ED: Glucose-Capillary: 177 mg/dL — ABNORMAL HIGH (ref 70–99)

## 2020-07-10 LAB — URINALYSIS, ROUTINE W REFLEX MICROSCOPIC
Bilirubin Urine: NEGATIVE
Glucose, UA: NEGATIVE mg/dL
Hgb urine dipstick: NEGATIVE
Ketones, ur: NEGATIVE mg/dL
Nitrite: POSITIVE — AB
Protein, ur: NEGATIVE mg/dL
Specific Gravity, Urine: 1.006 (ref 1.005–1.030)
pH: 6 (ref 5.0–8.0)

## 2020-07-10 LAB — TROPONIN I (HIGH SENSITIVITY)
Troponin I (High Sensitivity): 6 ng/L (ref <18)
Troponin I (High Sensitivity): 5 ng/L (ref <18)

## 2020-07-10 MED ORDER — CEPHALEXIN 500 MG PO CAPS: 500.0000 mg | ORAL_CAPSULE | Freq: Three times a day (TID) | ORAL | 0 refills | Status: AC

## 2020-07-10 MED ORDER — LACTATED RINGERS IV BOLUS
500.0000 mL | Freq: Once | INTRAVENOUS | Status: AC
  Administered 2020-07-10: 500 mL via INTRAVENOUS

## 2020-07-10 NOTE — ED Triage Notes (Signed)
Patient present due to a near syncopal event in the waiting room of her cardiologist. She has experienced malaise since having dental work done on Tuesday. EMS noted the patient was dizzy upon arrival and was borderline hypotensive. HX diabetes   EMS vitals: 100/60 BP 197 CBG 66 HR 100% SPO2 on room air

## 2020-07-10 NOTE — Assessment & Plan Note (Deleted)
On oral agents- BS 147 today in the office

## 2020-07-10 NOTE — Assessment & Plan Note (Deleted)
Pt is hypotensive- multiple possible etiologies- R/O MI R/O overmedication and dehydration after her tooth extaction, poor PO intake R/O PE- elevated d-dimer -19 in April

## 2020-07-10 NOTE — ED Provider Notes (Signed)
[facility] DEPT Provider Note   CSN: [id number] Arrival date & time: 07/10/20  1522     History Chief Complaint  Patient presents with  . Near Syncope    [name] is a 86 y.o. female.  [name] is an 86 y/o F with a PMHx of CAD with CABG, CHF (4/21 LV EF of 70-70%) brought in by EMS from the patient's cardiology office for near dizziness, weakness, and nausea that occurred prior to arrival. The patient states she was walking into her cardiologists office and when she started feeling dizzy and had to sit down. She describes the dizziness as room spinning. She sat down in a wheelchair and began feeling nauseated. She denies loss of consciousness, chest pain, fluttering in her chest, back pain, or shortness of breath during the event. She denies vomiting episode. She denie recent illness. She does state she had dental surgery two days ago, has been in pain and only been able to tolerate fluids or soft food such as apple sauce. Today the patient states she has had two applesauce's and a vanilla milkshake.   She also denies fever, chills, sore throat, numbness or tingling, headache. She has had burning when she urinates which has been followed up by a urology and the patient notes they looked in her bladder with a camera and did not find anything.   The history is provided by the patient and the EMS personnel.  Near Syncope This is a new problem. The current episode started 1 to 2 hours ago. The problem has been gradually improving. Pertinent negatives include no chest pain, no abdominal pain, no headaches and no shortness of breath.  Dizziness Quality:  Room spinning Severity:  Mild Progression:  Resolved Chronicity:  New Context: not with loss of consciousness   Associated symptoms: nausea and weakness   Associated symptoms: no chest pain, no diarrhea, no headaches, no palpitations, no shortness of breath and no vomiting        Past Medical  History:  Diagnosis Date  . Allergy to IVP dye   . Angina, class I ([place]) 04/22/2015  . Atherosclerotic heart disease native coronary artery w/angina pectoris ([place]) 1992   a. 1992 s/p PTCA of LAD;  b. 1998 CABG x 3; c. 07/2001 Cath: Sev LM/LAD/LCX dzs, 3/3 patent grafts; d. 11/2013 Cath: stable graft anatomy; e. 10/2016 Cath: native 3VD, VG->OM nl, LIMA->LAD nl, VG->Diag 55.  Marland Kitchen Atherosclerotic heart disease of native coronary artery with angina pectoris ([place]) 01/14/2010   Qualifier: Diagnosis of  By: Deatra Ina MD, Sandy Salaam   . Atrial septal aneurysm   . Barrett's esophagus 03/04/2010   Qualifier: Diagnosis of  By: Trellis Paganini PA-c, Amy S   . Bilateral leg edema 04/22/2015  . Bradycardia, mild briefly to the 40s, asymptomatic 05/16/2013  . Carotid arterial disease ([place])    a. 05/2014 Carotid U/S: No signif bilat ICA stenosis, >60% L ECA.  Marland Kitchen Chronic anemia   . Chronic diastolic CHF (congestive heart failure) ([place])    a. 10/2016 Echo: Ef 55-60%, no rwma, Gr1 DD, triv AI, PASP 52mmHg, atrial septal aneurysm.  . Chronic diastolic CHF (congestive heart failure), NYHA class 2 ([place]) 08/11/2017  . Claudication ([place]) 05/16/2013   Peripheral arterial occlusive disease with lifestyle limiting claudication   . Degenerative joint disease 05/30/2018  . DIVERTICULOSIS OF COLON 01/14/2010   Qualifier: Diagnosis of  By: Deatra Ina MD, Sandy Salaam   . DM (diabetes mellitus), type 2 with peripheral vascular complications (  [place])    On Oral medications.  . DOE (dyspnea on exertion) 04/22/2015  . Essential hypertension   . FECAL INCONTINENCE 03/04/2010   Qualifier: Diagnosis of  By: Trellis Paganini PA-c, Amy S   . GERD (gastroesophageal reflux disease)   . Hyperlipidemia with target LDL less than 70   . Inflammatory arthritis 10/13/2017  . Non-insulin dependent type 2 diabetes mellitus ([place]) 10/24/2015  . NSTEMI (non-ST elevated myocardial infarction) ([place]) 11/01/2016  . PAD (peripheral artery disease) ([place]) 05/2013; 09/2013   a. severe  calcified POP-1 & TP Trunk Dz bilat;  b . 05/2013 Diamondback Rot Athrectomy (R POP) --> PTA R Pop, DES to R Peroneal;  c. 09/2013 PTA/Stenting of L Pop [name] - retrograde access);  d. 04/2014 ABI's: R/L: 1.1/1.1.  Marland Kitchen Preoperative cardiovascular examination 04/15/2020  . PUD (peptic ulcer disease)   . S/P CABG x 3 1998   LIMA-LAD, SVG-OM, SVG-D1  . Severe claudication ([place]) 05/2013   Referred for Peripheral Angio (Dr. Gwenlyn Found --> Dr. Brunetta Jeans in [place])  . [place] ALLERGY 01/14/2010   Qualifier: Diagnosis of  By: Nelson-Smith CMA (AAMA), [name]    . Status post total replacement of right hip 10/12/2018  . Vertigo 01/02/2014    Patient Active Problem List   Diagnosis Date Noted  . Near syncope 07/10/2020  . Hx of CABG 07/10/2020  . Preoperative cardiovascular examination 04/15/2020  . Chest pain 03/18/2020  . Unilateral primary osteoarthritis, left hip 04/03/2019  . History of right hip replacement 04/03/2019  . Status post total replacement of right hip 10/12/2018  . Unilateral primary osteoarthritis, right hip 09/12/2018  . Degenerative joint disease 05/30/2018  . Anal fissure 11/10/2017  . Rectal bleeding 11/10/2017  . Rectal pain 11/10/2017  . Inflammatory arthritis 10/13/2017  . Latent tuberculosis by blood test 10/13/2017  . Chronic diastolic CHF (congestive heart failure), NYHA class 2 ([place]) 08/11/2017  . Fatigue due to treatment 01/09/2017  . NSTEMI (non-ST elevated myocardial infarction) ([place]) 11/01/2016  . Diabetes mellitus ([place]) 10/24/2015  . Non-insulin dependent type 2 diabetes mellitus ([place]) 10/24/2015  . Angina, class I ([place]) 04/22/2015  . DOE (dyspnea on exertion) 04/22/2015  . Bilateral leg edema 04/22/2015  . Accumulation of fluid in tissues 03/21/2014  . Vertigo, central 01/02/2014  . Vertigo 01/02/2014  . Limb pain- echymosis, pain Lt radial artery after cath 12/11/2013  . Dyslipidemia, goal LDL below 70 05/17/2013  . Essential hypertension 05/17/2013  .  Claudication ([place]) 05/16/2013  . PTA/ Stent Rt popliteal and Rt peroneal artery 05/16/13 05/16/2013  . PAD,severe calcified pop and tibial peroneal trunk disease bilateraly   . ABDOMINAL PAIN RIGHT LOWER QUADRANT 10/07/2010  . CVA-STROKE 03/04/2010  . BARRETT'S ESOPHAGUS 03/04/2010  . FECAL INCONTINENCE 03/04/2010  . DM 01/14/2010  . Anemia of chronic disease 01/14/2010  . Atherosclerotic heart disease of native coronary artery with angina pectoris ([place]) 01/14/2010  . DIVERTICULOSIS OF COLON 01/14/2010  . PERSONAL HISTORY OF COLONIC POLYPS 01/14/2010  . [place] ALLERGY 01/14/2010    Past Surgical History:  Procedure Laterality Date  . ATHERECTOMY Right 05/15/2013   Procedure: ATHERECTOMY;  Surgeon: Lorretta Harp, MD;  Location: [facility] CATH LAB;  Service: Cardiovascular;  Laterality: Right;  popliteal  . BACK SURGERY    . CARDIAC CATHETERIZATION  07/17/01   2 v CAD with LM, circ, LAD, obtuse diag, mod stenosis of diag vein graft , mild stenosis of marg vein graft, nl EF, medical treatment  . CARDIAC CATHETERIZATION  11/2013  Widely patent LIMA-LAD & SVG-OM with mild progression of proximal stenosis ~40-50% in SVG-D1; Widely patent native RCA with known severe native LCA disease  . CARDIAC CATHETERIZATION N/A 11/01/2016   Procedure: Left Heart Cath and Cors/Grafts Angiography;  Surgeon: [name], MD;  Location: [facility] CV LAB;  Service: Cardiovascular: Hemodynamics: High LVEDP!.  Cors/Grafts: LM 55%, o-p LAD 100% then after D1 -> LIMA-LAD patent, SVG-Diag ~55%. small RI wiht ~70% ostial. O-p Cx 80% - pCx 70% --> SVG-bifurcating OM patent. -- med Rx  . CORONARY ANGIOPLASTY  1992   PCI to LAD  . CORONARY ARTERY BYPASS GRAFT  1998   LIMA-LAD, SVG-diagonal (none roughly 50% stenosis), SVG-OM  . LEFT HEART CATHETERIZATION WITH CORONARY ANGIOGRAM N/A 11/27/2013   Procedure: LEFT HEART CATHETERIZATION WITH CORONARY ANGIOGRAM;  Surgeon: [name], MD;  Location[facility] CATH LAB;   Service: Cardiovascular;  Laterality: N/A;  . LOW EXTREMITY DOPPLERS/ABI  10/2016   Patent right SFA, popliteal and peroneal tendons. Patent left SFA and popliteal stent. 30-50% stenosis in the right common femoral and popliteal arteries, 50-74% stenosis in left profunda femoris artery. --> 1 year follow-up.  RABI - 1.2, LABI 1.1  . LOWER EXTREMITY ANGIOGRAM N/A 02/22/2013   Procedure: LOWER EXTREMITY ANGIOGRAM;  Surgeon: Lorretta Harp, MD;  Location: [facility] CATH LAB;  Service: Cardiovascular;  Laterality: N/A;  . LOWER EXTREMITY ANGIOGRAM N/A 07/20/2013   Procedure: LOWER EXTREMITY ANGIOGRAM;  Surgeon: Lorretta Harp, MD;  Location: [facility] CATH LAB;  Service: Cardiovascular;  Laterality: N/A;  . NM MYOVIEW LTD  04/03/2013; 5/26'/2016   Lexiscan: a)  Apical perfusion defect with no ischemia. Consider prior infarct versus breast attenuation.;; b) 5/'16: LOW RISK, Nl EF ~55-65%, No Ischemia /Infarction  . NM MYOVIEW LTD  10/'19; 4/'21   a) LOW RISK.  EF 55%.  No ischemia or infarction. b) EF estimated 81%.  Suboptimal images.  May be subtle inferior ischemia-initially read by radiology as intermediate risk, however overread by cardiology to be mild low risk..  . PERCUTANEOUS STENT INTERVENTION Right 05/15/2013   Procedure: PERCUTANEOUS STENT INTERVENTION;  Surgeon: Lorretta Harp, MD;  Location: [facility] CATH LAB;  Service: Cardiovascular;  Laterality: Right;  tibioperoneal trunk  . Peroneal Artery Stent  05/15/13   Diamondback rot. atherectomy of high grade segmental popliteal stenosis then [place] balloonPTA; then angiosculpt PTA of the prox peroneal with stent-Xpedition   . pv angio  07/20/13   high-grade calcified popliteal disease with one-vessel runoff via a small anterior tibial artery that has proximal disease as well, no intervention  . TOTAL HIP ARTHROPLASTY Right 10/12/2018   Procedure: RIGHT TOTAL HIP ARTHROPLASTY ANTERIOR APPROACH;  Surgeon: Mcarthur Rossetti, MD;  Location: [facility] ORS;  Service:  Orthopedics;  Laterality: Right;  . TRANSTHORACIC ECHOCARDIOGRAM  11/'17; 10/19   a) EF 55-60%. Gr 1 DD. Normal PAP. Aortic Sclerosis.;; b) Normal LV size and function with vigorous EF 65 to 70%.  GR 1 DD.  Severe LA dilation (suggestive of probably worsening: GR 1 DD).  . TRANSTHORACIC ECHOCARDIOGRAM  03/18/2020   EF 70-75%.  Hyperdynamic.  GR 1 DD.  No R WMA.  Mild LA dilation.  Mild aortic valve sclerosis but no stenosis.-No change from prior echo  . TUBAL LIGATION       OB History   No obstetric history on file.     Family History  Problem Relation Age of Onset  . Hypertension Mother   . Hyperlipidemia Mother   . Hyperlipidemia  Father   . Hypertension Father     Social History   Tobacco Use  . Smoking status: Never Smoker  . Smokeless tobacco: Never Used  Vaping Use  . Vaping Use: Never used  Substance Use Topics  . Alcohol use: No    Comment: rare glass of wine   . Drug use: No    Home Medications Prior to Admission medications   Medication Sig Start Date End Date Taking? Authorizing Provider  acetaminophen (TYLENOL) 500 MG tablet Take 1 tablet (500 mg total) by mouth daily as needed for moderate pain. LEG PAIN. PLEASE DISCUSS STARTING A MEDICATION FOR PAIN MANAGEMENT WITH YOUR PCP. Patient taking differently: Take 500 mg by mouth daily as needed for moderate pain.  06/26/19   Lorretta Harp, MD  acetaminophen-codeine (TYLENOL #3) 300-30 MG tablet Take 1-2 tablets by mouth every 8 (eight) hours as needed for moderate pain. 06/20/20   Mcarthur Rossetti, MD  carvedilol (COREG) 6.25 MG tablet Take 1 tablet (6.25 mg total) by mouth 2 (two) times daily. 04/08/20 07/07/20  Lorretta Harp, MD  cephALEXin (KEFLEX) 500 MG capsule Take 1 capsule (500 mg total) by mouth 3 (three) times daily for 7 days. 07/10/20 07/17/20  Riesa Pope, MD  Cholecalciferol (VITAMIN D3) 10 MCG (400 UNIT) tablet Take 400 Units by mouth daily.    [provider]  clopidogrel  (PLAVIX) 75 MG tablet Take 75 mg by mouth daily.    [provider]  Coenzyme Q10 (CO Q 10 PO) Take 1 tablet by mouth every other day.     [provider]  cyanocobalamin 1000 MCG tablet Take 1,000 mcg by mouth daily.    [provider]  ezetimibe (ZETIA) 10 MG tablet Take 10 mg by mouth daily.    [provider]  folic acid (FOLVITE) 1 MG tablet Take 1 mg by mouth daily. 08/21/18   [provider]  furosemide (LASIX) 40 MG tablet Take 40 Mg (1 tablet) daily Patient taking differently: Take 20 mg by mouth daily.  03/04/20   Lorretta Harp, MD  gabapentin (NEURONTIN) 300 MG capsule TAKE 1 CAPSULE BY MOUTH THREE TIMES A DAY Patient taking differently: Take 300 mg by mouth 3 (three) times daily.  01/01/19   Wallene Huh, DPM  glimepiride (AMARYL) 1 MG tablet Take 1 mg by mouth daily with breakfast.  05/02/18   [provider]  isosorbide mononitrate (IMDUR) 60 MG 24 hr tablet TAKE 1.5 TABLETS (90 MG TOTAL) BY MOUTH DAILY. Marland Kitchen Patient taking differently: Take 60 mg by mouth daily. . 12/21/19   Lorretta Harp, MD  LORazepam (ATIVAN) 0.5 MG tablet Take 0.5 mg by mouth every 6 (six) hours as needed for anxiety or sleep.  03/17/18   [provider]  methocarbamol (ROBAXIN) 500 MG tablet Take 1 tablet (500 mg total) by mouth every 8 (eight) hours as needed for muscle spasms. 03/19/20   Lavina Hamman, MD  methotrexate (RHEUMATREX) 2.5 MG tablet Take 20 mg by mouth once a week. Every Monday of each week 08/21/18   [provider]  nitroGLYCERIN (NITROSTAT) 0.4 MG SL tablet PLACE 1 TABLET (0.4 MG TOTAL) UNDER THE TONGUE EVERY 5 (FIVE) MINUTES AS NEEDED FOR CHEST PAIN. 02/13/18   Theora Gianotti, NP  pantoprazole (PROTONIX) 40 MG tablet Take 1 tablet (40 mg total) by mouth 2 (two) times daily before a meal. 03/19/20   Lavina Hamman, MD  predniSONE (DELTASONE) 5 MG  tablet Take 5 mg by mouth daily with breakfast.     [provider]  rosuvastatin (CRESTOR) 40 MG tablet Take 1 tablet (40 mg total) by mouth at bedtime. Patient taking differently: Take 20 mg by mouth daily.  04/08/20   Lorretta Harp, MD  sulfaSALAzine (AZULFIDINE) 500 MG EC tablet Take 500 mg by mouth 2 (two) times daily.  05/29/18   [provider]  traMADol (ULTRAM) 50 MG tablet Take 1 tablet (50 mg total) by mouth every 8 (eight) hours as needed for severe pain. Patient not taking: Reported on 05/05/2020 02/14/20   Pete Pelt, PA-C    Allergies    Fish allergy, Ivp dye [iodinated diagnostic agents], Latex, Shellfish allergy, Shellfish-derived products, Other, and Metformin  Review of Systems   Review of Systems  Constitutional: Negative for chills and fever.  HENT: Negative for sore throat.   Respiratory: Negative for cough and shortness of breath.   Cardiovascular: Positive for near-syncope. Negative for chest pain and palpitations.  Gastrointestinal: Positive for nausea. Negative for abdominal pain, diarrhea and vomiting.  Genitourinary: Positive for dysuria.  Musculoskeletal: Negative for back pain and neck pain.  Neurological: Positive for dizziness and weakness. Negative for headaches.  All other systems reviewed and are negative.   Physical Exam Updated Vital Signs BP (!) 196/94   Pulse 52   Temp 98.1 F (36.7 C) (Oral)   Resp 22   SpO2 100%   Physical Exam Vitals and nursing note reviewed.  Constitutional:      General: She is not in acute distress.    Appearance: Normal appearance. She is normal weight. She is not ill-appearing, toxic-appearing or diaphoretic.  HENT:     Head: Normocephalic and atraumatic.     Mouth/Throat:     Mouth: Mucous membranes are moist.  Eyes:     Extraocular Movements: Extraocular movements intact.     Pupils: Pupils are equal, round, and reactive to light.  Cardiovascular:     Rate and Rhythm: Normal rate and regular rhythm.     Heart sounds: No murmur heard.  No gallop.     Pulmonary:     Effort: Pulmonary effort is normal. No respiratory distress.     Breath sounds: Normal breath sounds. No stridor. No wheezing, rhonchi or rales.  Abdominal:     General: Abdomen is flat. There is no distension.     Palpations: Abdomen is soft.     Tenderness: There is abdominal tenderness. There is no guarding or rebound.  Musculoskeletal:     Cervical back: Normal range of motion and neck supple. No tenderness.     Right lower leg: No edema.     Left lower leg: No edema.  Neurological:     General: No focal deficit present.     Mental Status: She is alert and oriented to person, place, and time.     Cranial Nerves: No cranial nerve deficit.     Sensory: No sensory deficit.     ED Results / Procedures / Treatments   Labs (all labs ordered are listed, but only abnormal results are displayed) Labs Reviewed  BASIC METABOLIC PANEL - Abnormal; Notable for the following components:      Result Value   Glucose, Bld 193 (*)    Creatinine, Ser 1.08 (*)    GFR calc non Af Amer 47 (*)    GFR calc Af Amer 55 (*)    All other components within normal limits  URINALYSIS,  ROUTINE W REFLEX MICROSCOPIC - Abnormal; Notable for the following components:   Nitrite POSITIVE (*)    Leukocytes,Ua MODERATE (*)    Bacteria, UA MANY (*)    All other components within normal limits  CBG MONITORING, ED - Abnormal; Notable for the following components:   Glucose-Capillary 177 (*)    All other components within normal limits  URINE CULTURE  CBC WITH DIFFERENTIAL/PLATELET  CBG MONITORING, ED  TROPONIN I (HIGH SENSITIVITY)  TROPONIN I (HIGH SENSITIVITY)    EKG EKG Interpretation  Date/Time:  Thursday July 10 2020 16:00:34 EDT Ventricular Rate:  57 PR Interval:    QRS Duration: 94 QT Interval:  451 QTC Calculation: 440 R Axis:   61 Text Interpretation: Sinus rhythm Abnormal R-wave progression, early transition Abnrm T, consider ischemia, anterolateral lds No significant change  since last tracing Confirmed by Isla Pence [phone]) on 07/10/2020 4:20:42 PM  No acute changes compared to prior EKG from 05/06/20  Radiology DG Chest 1 View  Result Date: 07/10/2020 CLINICAL DATA:  Near syncope. Additional provided: Patient reports chest heaviness for 2 days. EXAM: CHEST  1 VIEW COMPARISON:  Prior chest radiographs 05/05/2020 and earlier. FINDINGS: Prior median sternotomy heart size at the upper limits of normal, unchanged. Aortic atherosclerosis. Shallow inspiration radiograph without appreciable airspace consolidation or pulmonary edema. No acute bony abnormality is identified. Prominent degenerative change of the left glenohumeral joint. Surgical clips within the upper abdomen. IMPRESSION: Shallow inspiration radiograph without evidence of acute cardiopulmonary abnormality. Prior median sternotomy/CABG. Aortic Atherosclerosis (ICD10-I70.0). Prominent degenerative changes of the left glenohumeral joint redemonstrated. Electronically Signed   By: Kellie Simmering DO   On: 07/10/2020 17:53    Procedures Procedures (including critical care time)  Medications Ordered in ED Medications  lactated ringers bolus 500 mL (500 mLs Intravenous New Bag/Given 07/10/20 1625)    ED Course  I have reviewed the triage vital signs and the nursing notes.  Pertinent labs & imaging results that were available during my care of the patient were reviewed by me and considered in my medical decision making (see chart for details).  9:32 PM Patient able to ambulate without difficulty. Patient states she feels better    MDM Rules/Calculators/A&P                          [name] is an 87 y/o F with a PMHx of CAD with CABG, CHF ( last echo 4/21 with LVEF of 70-75%), DM, and dental surgery 2 days ago, brought in by EMS for near syncopal episode while walking into her cardiologists office. She denies any chest pain, back pain, shortness of breath, or fluttering in her chest when the near syncopal  episode occurred. Denied loss of consciousness. She denies recent illness.   CBC - Unremarkable, wbc of 5.8, hgb of 12.4.  BMP - Glucose of 193, Cr of 1.08, in past patient's Cr has fluctuated 1.12, .89 Urinalysis - positive nitrites and moderate leukocytes, many bacteria. Culture pending Troponin I - 6 , Second Troponin - negative as well EKG - No acute changes since prior EKG from 05/06/20 Chest X-ray - No evidence of acute cardiopulmonary changes Orthostatics - As patient moved from lying (125/69) to sitting up (146/80), patient's pressure increased. Upon standing patient's pressure dropped to 133/65.  Patient ambulated well throughout hallway.   Suspected etiology of dehydration and patient's decrease in food intake due to recent dental surgery. Given LR 500 mL Bolus. Patient also has  urinary tract infection. Prior cultures have shown sensitivity to keflex. Will start keflex and culture, patient will be notified if cultures show resistance to keflex. Disussed with patient and daughter need to stay hydrated and eat as tolerable, start with soft foods and progress as long as patient is able to keep dental pain under control.   Low suspicion for myocardial infarction, CVA/TIA, pulmonary embolism, aortic dissection, infectious etiology as cause of patient's near syncopal episode. Do not believe patient warrants admission at this time.   Recommended patient follow up with primary care provider and she was given return precautions.    Final Clinical Impression(s) / ED Diagnoses Final diagnoses:  Near syncope  Acute cystitis without hematuria    Rx / DC Orders ED Discharge Orders         Ordered    cephALEXin (KEFLEX) 500 MG capsule  3 times daily     Discontinue  Reprint     07/10/20 2128           Riesa Pope, MD 07/10/20 2239    Isla Pence, MD 07/10/20 2300

## 2020-07-10 NOTE — Assessment & Plan Note (Deleted)
H/O LE PTA

## 2020-07-10 NOTE — Discharge Instructions (Addendum)
Thank you for allowing [place] to care for you today! Please continue to drink and eat as much as tolerable since your recent dental surgery. Please take the antibiotics as prescribed for your urinary tract infection. Please take your home medications when you get home.   If any new symptoms arise or your current symptoms worsen, please return to your closest emergency department.

## 2020-07-10 NOTE — Progress Notes (Deleted)
Cardiology Office Note:    Date:  07/10/2020   ID:  Domingue, [place]train Aug 05, 1933, MRN [id number]  PCP:  Jani Gravel, MD  Cardiologist:  [name], MD  Electrophysiologist:  None   Referring MD: Jani Gravel, MD   CC:  Pt was in the office waiting room for a routine office visit when she became unresponsive and slumped to one side.   History of Present Illness:    [name] is a 86 y.o. female with a hx of CAD, status post CABG in 1998 x3.  Catheterization in 2014 showed stable grafts.  She was most recently admitted in April 2021 with chest pain.  Myoview done was low risk with mild inferior ischemia.  She did have an elevated D-dimer of almost 20 during that admission.  The patient apparently has a contrast allergy and CTA of the chest was not done.  She did have an MRA of the chest to rule out aortic dissection and the discharging hospitalist reviewed this with the radiologist who felt that there was no evidence of a pulmonary embolism.  Lower extremity Dopplers were negative for DVT then.  The patient was then admitted in May 2021 after near syncopal spell.  No clear etiology was found.  It was suspected she may have been dehydrated although this was not obvious by her lab work.  The patient was coming to the office today for routine cardiac follow-up.  She was accompanied by her daughter.  The patient lives alone and her daughter lives nearby.  The patient tells me she had a tooth pulled Tuesday and has not felt well since.  She denies any chest pain, she does have some low-grade nausea.  Blood pressure 1 we got her into her room was 80 systolic.  Her heart rhythm was regular.  We were unable to obtain an EKG before EMS arrived.  The patient is awake and alert and conversant.  She is not diaphoretic.  She does continue to complain of vague abdominal discomfort but no chest pain.  She is in no respiratory distress.    Past Medical History:  Diagnosis Date  . Allergy to IVP dye   .  Angina, class I ([place]) 04/22/2015  . Atherosclerotic heart disease native coronary artery w/angina pectoris ([place]) 1992   a. 1992 s/p PTCA of LAD;  b. 1998 CABG x 3; c. 07/2001 Cath: Sev LM/LAD/LCX dzs, 3/3 patent grafts; d. 11/2013 Cath: stable graft anatomy; e. 10/2016 Cath: native 3VD, VG->OM nl, LIMA->LAD nl, VG->Diag 55.  Marland Kitchen Atherosclerotic heart disease of native coronary artery with angina pectoris ([place]) 01/14/2010   Qualifier: Diagnosis of  By: Deatra Ina MD, Sandy Salaam   . Atrial septal aneurysm   . Barrett's esophagus 03/04/2010   Qualifier: Diagnosis of  By: Trellis Paganini PA-c, Amy S   . Bilateral leg edema 04/22/2015  . Bradycardia, mild briefly to the 40s, asymptomatic 05/16/2013  . Carotid arterial disease ([place])    a. 05/2014 Carotid U/S: No signif bilat ICA stenosis, >60% L ECA.  Marland Kitchen Chronic anemia   . Chronic diastolic CHF (congestive heart failure) ([place])    a. 10/2016 Echo: Ef 55-60%, no rwma, Gr1 DD, triv AI, PASP 98mmHg, atrial septal aneurysm.  . Chronic diastolic CHF (congestive heart failure), NYHA class 2 ([place]) 08/11/2017  . Claudication ([place]) 05/16/2013   Peripheral arterial occlusive disease with lifestyle limiting claudication   . Degenerative joint disease 05/30/2018  . DIVERTICULOSIS OF COLON 01/14/2010   Qualifier: Diagnosis of  By: Deatra Ina MD, Sandy Salaam DM (diabetes mellitus), type 2 with peripheral vascular complications ([place])    On Oral medications.  . DOE (dyspnea on exertion) 04/22/2015  . Essential hypertension   . FECAL INCONTINENCE 03/04/2010   Qualifier: Diagnosis of  By: Trellis Paganini PA-c, Amy S   . GERD (gastroesophageal reflux disease)   . Hyperlipidemia with target LDL less than 70   . Inflammatory arthritis 10/13/2017  . Non-insulin dependent type 2 diabetes mellitus ([place]) 10/24/2015  . NSTEMI (non-ST elevated myocardial infarction) ([place]) 11/01/2016  . PAD (peripheral artery disease) ([place]) 05/2013; 09/2013   a. severe calcified POP-1 & TP Trunk Dz bilat;  b . 05/2013  Diamondback Rot Athrectomy (R POP) --> PTA R Pop, DES to R Peroneal;  c. 09/2013 PTA/Stenting of L Pop [name] - retrograde access);  d. 04/2014 ABI's: R/L: 1.1/1.1.  Marland Kitchen Preoperative cardiovascular examination 04/15/2020  . PUD (peptic ulcer disease)   . S/P CABG x 3 1998   LIMA-LAD, SVG-OM, SVG-D1  . Severe claudication ([place]) 05/2013   Referred for Peripheral Angio (Dr. Gwenlyn Found --> Dr. Brunetta Jeans in [place])  . [place] ALLERGY 01/14/2010   Qualifier: Diagnosis of  By: Nelson-Smith CMA (AAMA), [name]    . Status post total replacement of right hip 10/12/2018  . Vertigo 01/02/2014    Past Surgical History:  Procedure Laterality Date  . ATHERECTOMY Right 05/15/2013   Procedure: ATHERECTOMY;  Surgeon: Lorretta Harp, MD;  Location: [facility] CATH LAB;  Service: Cardiovascular;  Laterality: Right;  popliteal  . BACK SURGERY    . CARDIAC CATHETERIZATION  07/17/01   2 v CAD with LM, circ, LAD, obtuse diag, mod stenosis of diag vein graft , mild stenosis of marg vein graft, nl EF, medical treatment  . CARDIAC CATHETERIZATION  11/2013   Widely patent LIMA-LAD & SVG-OM with mild progression of proximal stenosis ~40-50% in SVG-D1; Widely patent native RCA with known severe native LCA disease  . CARDIAC CATHETERIZATION N/A 11/01/2016   Procedure: Left Heart Cath and Cors/Grafts Angiography;  Surgeon: [name], MD;  Location: [facility] CV LAB;  Service: Cardiovascular: Hemodynamics: High LVEDP!.  Cors/Grafts: LM 55%, o-p LAD 100% then after D1 -> LIMA-LAD patent, SVG-Diag ~55%. small RI wiht ~70% ostial. O-p Cx 80% - pCx 70% --> SVG-bifurcating OM patent. -- med Rx  . CORONARY ANGIOPLASTY  1992   PCI to LAD  . CORONARY ARTERY BYPASS GRAFT  1998   LIMA-LAD, SVG-diagonal (none roughly 50% stenosis), SVG-OM  . LEFT HEART CATHETERIZATION WITH CORONARY ANGIOGRAM N/A 11/27/2013   Procedure: LEFT HEART CATHETERIZATION WITH CORONARY ANGIOGRAM;  Surgeon: [name], MD;  Location: [facility] CATH LAB;  Service:  Cardiovascular;  Laterality: N/A;  . LOW EXTREMITY DOPPLERS/ABI  10/2016   Patent right SFA, popliteal and peroneal tendons. Patent left SFA and popliteal stent. 30-50% stenosis in the right common femoral and popliteal arteries, 50-74% stenosis in left profunda femoris artery. --> 1 year follow-up.  RABI - 1.2, LABI 1.1  . LOWER EXTREMITY ANGIOGRAM N/A 02/22/2013   Procedure: LOWER EXTREMITY ANGIOGRAM;  Surgeon: Lorretta Harp, MD;  Location: [facility] CATH LAB;  Service: Cardiovascular;  Laterality: N/A;  . LOWER EXTREMITY ANGIOGRAM N/A 07/20/2013   Procedure: LOWER EXTREMITY ANGIOGRAM;  Surgeon: Lorretta Harp, MD;  Location: [facility] CATH LAB;  Service: Cardiovascular;  Laterality: N/A;  . NM MYOVIEW LTD  04/03/2013; 5/26'/2016   Lexiscan: a)  Apical perfusion defect with no ischemia. Consider prior infarct versus  breast attenuation.;; b) 5/'16: LOW RISK, Nl EF ~55-65%, No Ischemia /Infarction  . NM MYOVIEW LTD  10/'19; 4/'21   a) LOW RISK.  EF 55%.  No ischemia or infarction. b) EF estimated 81%.  Suboptimal images.  May be subtle inferior ischemia-initially read by radiology as intermediate risk, however overread by cardiology to be mild low risk..  . PERCUTANEOUS STENT INTERVENTION Right 05/15/2013   Procedure: PERCUTANEOUS STENT INTERVENTION;  Surgeon: Lorretta Harp, MD;  Location: [facility] CATH LAB;  Service: Cardiovascular;  Laterality: Right;  tibioperoneal trunk  . Peroneal Artery Stent  05/15/13   Diamondback rot. atherectomy of high grade segmental popliteal stenosis then Chocolate balloonPTA; then angiosculpt PTA of the prox peroneal with stent-Xpedition   . pv angio  07/20/13   high-grade calcified popliteal disease with one-vessel runoff via a small anterior tibial artery that has proximal disease as well, no intervention  . TOTAL HIP ARTHROPLASTY Right 10/12/2018   Procedure: RIGHT TOTAL HIP ARTHROPLASTY ANTERIOR APPROACH;  Surgeon: Mcarthur Rossetti, MD;  Location: [facility] ORS;  Service: Orthopedics;   Laterality: Right;  . TRANSTHORACIC ECHOCARDIOGRAM  11/'17; 10/19   a) EF 55-60%. Gr 1 DD. Normal PAP. Aortic Sclerosis.;; b) Normal LV size and function with vigorous EF 65 to 70%.  GR 1 DD.  Severe LA dilation (suggestive of probably worsening: GR 1 DD).  . TRANSTHORACIC ECHOCARDIOGRAM  03/18/2020   EF 70-75%.  Hyperdynamic.  GR 1 DD.  No R WMA.  Mild LA dilation.  Mild aortic valve sclerosis but no stenosis.-No change from prior echo  . TUBAL LIGATION      Current Medications: No outpatient medications have been marked as taking for the 07/10/20 encounter (Appointment) with Erlene Quan, PA-C.     Allergies:   Fish allergy, Ivp dye [iodinated diagnostic agents], Latex, Shellfish allergy, Shellfish-derived products, Other, and Metformin   Social History   Socioeconomic History  . Marital status: Married    Spouse name: Not on file  . Number of children: Not on file  . Years of education: Not on file  . Highest education level: Not on file  Occupational History  . Occupation: retired  Tobacco Use  . Smoking status: Never Smoker  . Smokeless tobacco: Never Used  Vaping Use  . Vaping Use: Never used  Substance and Sexual Activity  . Alcohol use: No    Comment: rare glass of wine   . Drug use: No  . Sexual activity: Not on file  Other Topics Concern  . Not on file  Social History Narrative    She is a married mother of 27, grandmother of 95, great grandmother of 78.    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    She does not really get a lot of exercise,    She does not smoke or drink EtOH.   Social Determinants of Health   Financial Resource Strain:   . Difficulty of Paying Living Expenses:   Food Insecurity:   . Worried About Charity fundraiser in the Last Year:   . Arboriculturist in the Last Year:   Transportation Needs:   . Film/video editor (Medical):   Marland Kitchen Lack of Transportation (Non-Medical):   Physical Activity:   . Days of Exercise per  Week:   . Minutes of Exercise per Session:   Stress:   . Feeling of Stress :   Social Connections:   . Frequency of Communication with  Friends and Family:   . Frequency of Social Gatherings with Friends and Family:   . Attends Religious Services:   . Active Member of Clubs or Organizations:   . Attends Archivist Meetings:   Marland Kitchen Marital Status:      Family History: The patient's family history includes Hyperlipidemia in her father and mother; Hypertension in her father and mother.  ROS:   Please see the history of present illness.     All other systems reviewed and are negative.  EKGs/Labs/Other Studies Reviewed:    The following studies were reviewed today:  Myoview 03/19/2020- IMPRESSION: 1. The SPECT acquisition is not optimal due to some rotational differences in appearance of the left ventricle between stress and rest acquisitions. There is suggestion of probable infarcts involving the left ventricular apex, septum and lateral wall. There may be subtle inducible ischemia in the inferior wall.  2. Normal left ventricular wall motion.  3. Left ventricular ejection fraction 81%  4. Non invasive risk stratification*: Intermediate  Echo 03/18/2020- IMPRESSIONS    1. Left ventricular ejection fraction, by estimation, is 70 to 75%. The  left ventricle has hyperdynamic function. The left ventricle has no  regional wall motion abnormalities. There is mild concentric left  ventricular hypertrophy. Left ventricular  diastolic parameters are consistent with Grade I diastolic dysfunction  (impaired relaxation).  2. Right ventricular systolic function is normal. The right ventricular  size is normal. There is normal pulmonary artery systolic pressure.  3. Left atrial size was mildly dilated.  4. The mitral valve is normal in structure. No evidence of mitral valve  regurgitation.  5. The aortic valve is tricuspid. Aortic valve regurgitation is not   visualized. Mild aortic valve sclerosis is present, with no evidence of  aortic valve stenosis.  6. The inferior vena cava is normal in size with greater than 50%  respiratory variability, suggesting right atrial pressure of 3 mmHg.   Comparison(s): No significant change from prior study. Prior images  reviewed side by side.   EKG:  EKG is ordered today.  The ekg ordered 05/06/2020 NSR-SB-55  Recent Labs: 05/05/2020: ALT 20; B Natriuretic Peptide 65.2; BUN 19; Creatinine, Ser 0.80; Hemoglobin 12.2; Platelets 225; Potassium 4.3; Sodium 138; TSH 1.158  Recent Lipid Panel    Component Value Date/Time   CHOL 177 03/19/2020 0029   CHOL 138 05/19/2017 1254   TRIG 35 03/19/2020 0029   HDL 51 03/19/2020 0029   HDL 54 05/19/2017 1254   CHOLHDL 3.5 03/19/2020 0029   VLDL 7 03/19/2020 0029   LDLCALC 119 (H) 03/19/2020 0029   LDLCALC 73 05/19/2017 1254    Physical Exam:    VS:  There were no vitals taken for this visit.   B/P 82 systolic, HR 55, 02 54% on RA, R-12  Wt Readings from Last 3 Encounters:  06/04/20 162 lb (73.5 kg)  05/05/20 162 lb (73.5 kg)  04/15/20 158 lb 3.2 oz (71.8 kg)     GEN: Elderly AA female, well developed in no acute distress HEENT: Normal NECK: No JVD; No carotid bruits  CARDIAC: RRR, no murmurs, rubs, gallops RESPIRATORY:  Clear to auscultation without rales, wheezing or rhonchi  ABDOMEN: Soft, non-tender, non-distended MUSCULOSKELETAL:  No edema; No deformity  SKIN: Warm and dry NEUROLOGIC:  Alert and oriented x 3 PSYCHIATRIC:  Normal affect   ASSESSMENT:    Near syncope Pt is hypotensive- multiple possible etiologies- R/O MI R/O overmedication and dehydration after her tooth extaction, poor PO  intake R/O PE- elevated d-dimer -19 in April  Hx of CABG Remote CABG 1998- cath 2014- medical Rx, Myoview low risk April 2021  PTA/ Stent Rt popliteal and Rt peroneal artery 05/16/13 H/O LE PTA  Essential hypertension Hypotensive  today  Non-insulin dependent type 2 diabetes mellitus (HCC) On oral agents- BS 147 today in the office  PLAN:    Pt transported to the ED for further evaluation.    Medication Adjustments/Labs and Tests Ordered: Current medicines are reviewed at length with the patient today.  Concerns regarding medicines are outlined above.  No orders of the defined types were placed in this encounter.  No orders of the defined types were placed in this encounter.   There are no Patient Instructions on file for this visit.   Signed, Kerin Ransom, PA-C  07/10/2020 3:20 PM    [facility]

## 2020-07-10 NOTE — Assessment & Plan Note (Deleted)
Remote CABG 1998- cath 2014- medical Rx, Myoview low risk April 2021

## 2020-07-10 NOTE — Assessment & Plan Note (Deleted)
Hypotensive today

## 2020-07-12 LAB — URINE CULTURE: Culture: >=100000 — AB

## 2020-07-13 ENCOUNTER — Telehealth: Payer: Self-pay | Admitting: Emergency Medicine

## 2020-07-13 NOTE — Telephone Encounter (Signed)
Post ED Visit - Positive Culture Follow-up  Culture report reviewed by antimicrobial stewardship pharmacist: [place] Team []  Elenor Quinones, Pharm.D. []  Heide Guile, Pharm.D., BCPS AQ-ID []  Parks Neptune, Pharm.D., BCPS []  Alycia Rossetti, Pharm.D., BCPS []  [place], Pharm.D., BCPS, AAHIVP []  Legrand Como, Pharm.D., BCPS, AAHIVP []  Salome Arnt, PharmD, BCPS []  Johnnette Gourd, PharmD, BCPS []  Hughes Better, PharmD, BCPS [x]  Ulice Dash, PharmD []  Laqueta Linden, PharmD, BCPS []  Albertina Parr, PharmD  [place] Team []  Leodis Sias, PharmD []  Lindell Spar, PharmD []  Royetta Asal, PharmD []  Graylin Shiver, Rph []  Rema Fendt) Glennon Mac, PharmD []  Arlyn Dunning, PharmD []  Netta Cedars, PharmD []  Dia Sitter, PharmD []  Leone Haven, PharmD []  Gretta Arab, PharmD []  Theodis Shove, PharmD []  Peggyann Juba, PharmD []  Reuel Boom, PharmD   Positive urine culture Treated with Cephalexin, organism sensitive to the same and no further patient follow-up is required at this time.  [name] 07/13/2020, 2:26 PM

## 2020-07-15 DIAGNOSIS — R55 Syncope and collapse: Secondary | ICD-10-CM | POA: Diagnosis not present

## 2020-07-15 DIAGNOSIS — R3982 Chronic bladder pain: Secondary | ICD-10-CM | POA: Diagnosis not present

## 2020-07-15 DIAGNOSIS — R309 Painful micturition, unspecified: Secondary | ICD-10-CM | POA: Diagnosis not present

## 2020-07-15 DIAGNOSIS — N390 Urinary tract infection, site not specified: Secondary | ICD-10-CM | POA: Diagnosis not present

## 2020-08-06 ENCOUNTER — Encounter: Payer: Self-pay | Admitting: Physician Assistant

## 2020-08-06 ENCOUNTER — Ambulatory Visit (INDEPENDENT_AMBULATORY_CARE_PROVIDER_SITE_OTHER): Payer: Medicare Other | Admitting: Physician Assistant

## 2020-08-06 VITALS — Ht 61.0 in | Wt 154.6 lb

## 2020-08-06 DIAGNOSIS — I6523 Occlusion and stenosis of bilateral carotid arteries: Secondary | ICD-10-CM

## 2020-08-06 DIAGNOSIS — M1612 Unilateral primary osteoarthritis, left hip: Secondary | ICD-10-CM | POA: Diagnosis not present

## 2020-08-06 MED ORDER — ACETAMINOPHEN-CODEINE #3 300-30 MG PO TABS: 1.0000 | ORAL_TABLET | Freq: Three times a day (TID) | ORAL | 0 refills | Status: DC | PRN

## 2020-08-06 NOTE — Progress Notes (Addendum)
HPI: [name] comes in today to discuss setting up left total hip arthroplasty surgery.  She currently is being treated for urinary tract infection but would like to undergo surgery for left hip end-stage arthritis in the near future.  Patient is diabetic, history of coronary artery disease,, peripheral vascular disease with history of stent placements and is on Plavix. She is having constant left hip pain that radiates down to her knee.  She has left groin pain.  She has difficulty ambulating due to the pain..  Presents in wheelchair.  Review of systems: See HPI otherwise denies any fever or chills.  Physical exam: Left hip no internal rotation significant pain with attempts of strength.  Impression: End-stage arthritis Left hip  Plan: Discussed with her extensively that her urinary tract infection needs to be cleared prior to scheduling surgery.  Also unfortunately due to the  increase hospitalizations due to Covid pandemic we are unable to schedule cases at this point time.  Hopefully will be able to schedule within the month.  Risk benefits of surgery discussed with patient at length.  Postoperative protocol discussed with patient.  We will follow up about surgery scheduling for a left total hip arthroplasty once we have the lab.  We can proceed with her surgery once the UTI is cleared.  Again she would need to stop the Plavix 7 days prior to surgery.  Questions were encouraged and answered

## 2020-09-18 DIAGNOSIS — Z23 Encounter for immunization: Secondary | ICD-10-CM | POA: Diagnosis not present

## 2020-09-18 DIAGNOSIS — I739 Peripheral vascular disease, unspecified: Secondary | ICD-10-CM | POA: Diagnosis not present

## 2020-09-18 DIAGNOSIS — I2510 Atherosclerotic heart disease of native coronary artery without angina pectoris: Secondary | ICD-10-CM | POA: Diagnosis not present

## 2020-09-18 DIAGNOSIS — E118 Type 2 diabetes mellitus with unspecified complications: Secondary | ICD-10-CM | POA: Diagnosis not present

## 2020-09-18 DIAGNOSIS — N390 Urinary tract infection, site not specified: Secondary | ICD-10-CM | POA: Diagnosis not present

## 2020-09-18 DIAGNOSIS — I10 Essential (primary) hypertension: Secondary | ICD-10-CM | POA: Diagnosis not present

## 2020-09-18 DIAGNOSIS — E1140 Type 2 diabetes mellitus with diabetic neuropathy, unspecified: Secondary | ICD-10-CM | POA: Diagnosis not present

## 2020-09-18 DIAGNOSIS — I5032 Chronic diastolic (congestive) heart failure: Secondary | ICD-10-CM | POA: Diagnosis not present

## 2020-09-23 ENCOUNTER — Telehealth: Payer: Self-pay

## 2020-09-23 NOTE — Telephone Encounter (Signed)
Please advise 

## 2020-09-23 NOTE — Telephone Encounter (Signed)
Patient called she is requesting a built up for her left shoe she stated there is still pain. She is requesting medication for pain and would like to know more about her surgery. Call [phone]

## 2020-09-24 NOTE — Telephone Encounter (Signed)
Would recommend  she buy an insert off the shelf to put in her shoe. As we will try to get her leg lengths even at the time of surgery. Defer to [facility] as to when her surgery will be scheduled.

## 2020-09-24 NOTE — Telephone Encounter (Signed)
LMOM for patient about the shoe insert sending to you about surgery

## 2020-10-03 NOTE — Telephone Encounter (Signed)
I called patient and scheduled surgery. 

## 2020-10-09 DIAGNOSIS — D649 Anemia, unspecified: Secondary | ICD-10-CM | POA: Diagnosis not present

## 2020-10-09 DIAGNOSIS — M1990 Unspecified osteoarthritis, unspecified site: Secondary | ICD-10-CM | POA: Diagnosis not present

## 2020-10-09 DIAGNOSIS — Z13820 Encounter for screening for osteoporosis: Secondary | ICD-10-CM | POA: Diagnosis not present

## 2020-10-09 DIAGNOSIS — M79643 Pain in unspecified hand: Secondary | ICD-10-CM | POA: Diagnosis not present

## 2020-10-09 DIAGNOSIS — Z79899 Other long term (current) drug therapy: Secondary | ICD-10-CM | POA: Diagnosis not present

## 2020-10-09 DIAGNOSIS — E1169 Type 2 diabetes mellitus with other specified complication: Secondary | ICD-10-CM | POA: Diagnosis not present

## 2020-10-09 DIAGNOSIS — M25552 Pain in left hip: Secondary | ICD-10-CM | POA: Diagnosis not present

## 2020-10-09 DIAGNOSIS — M0609 Rheumatoid arthritis without rheumatoid factor, multiple sites: Secondary | ICD-10-CM | POA: Diagnosis not present

## 2020-10-09 DIAGNOSIS — Z227 Latent tuberculosis: Secondary | ICD-10-CM | POA: Diagnosis not present

## 2020-10-10 ENCOUNTER — Telehealth: Payer: Self-pay | Admitting: Orthopaedic Surgery

## 2020-10-10 MED ORDER — ACETAMINOPHEN-CODEINE #3 300-30 MG PO TABS: 1.0000 | ORAL_TABLET | Freq: Three times a day (TID) | ORAL | 0 refills | Status: DC | PRN

## 2020-10-10 NOTE — Telephone Encounter (Signed)
I sent in some tylenol#3.

## 2020-10-10 NOTE — Telephone Encounter (Signed)
Please advise 

## 2020-10-10 NOTE — Telephone Encounter (Signed)
Patient called requesting pain medication for left hip. She states she is in severe pains and having surgery soon. Please send to [facility] on [address] Patient phone number is [phone].

## 2020-10-11 ENCOUNTER — Ambulatory Visit: Payer: Medicare Other | Attending: Internal Medicine

## 2020-10-11 DIAGNOSIS — Z23 Encounter for immunization: Secondary | ICD-10-CM

## 2020-10-11 NOTE — Progress Notes (Signed)
   Covid-19 Vaccination Clinic  Name:  [name]    MRN: [id number] DOB: 08-19-1933  10/11/2020  [name] was observed post Covid-19 immunization for 30 minutes based on pre-vaccination screening without incident. She was provided with Vaccine Information Sheet and instruction to access the V-Safe system.   [name] was instructed to call 911 with any severe reactions post vaccine: Marland Kitchen Difficulty breathing  . Swelling of face and throat  . A fast heartbeat  . A bad rash all over body  . Dizziness and weakness

## 2020-10-13 DIAGNOSIS — H43811 Vitreous degeneration, right eye: Secondary | ICD-10-CM | POA: Diagnosis not present

## 2020-10-14 DIAGNOSIS — E118 Type 2 diabetes mellitus with unspecified complications: Secondary | ICD-10-CM | POA: Diagnosis not present

## 2020-10-14 DIAGNOSIS — E7800 Pure hypercholesterolemia, unspecified: Secondary | ICD-10-CM | POA: Diagnosis not present

## 2020-10-14 DIAGNOSIS — R946 Abnormal results of thyroid function studies: Secondary | ICD-10-CM | POA: Diagnosis not present

## 2020-10-15 ENCOUNTER — Other Ambulatory Visit: Payer: Self-pay

## 2020-10-22 ENCOUNTER — Other Ambulatory Visit: Payer: Self-pay | Admitting: Physician Assistant

## 2020-10-22 ENCOUNTER — Ambulatory Visit: Payer: Medicare Other | Admitting: Cardiovascular Disease

## 2020-10-23 ENCOUNTER — Encounter: Payer: Self-pay | Admitting: Physician Assistant

## 2020-10-23 ENCOUNTER — Ambulatory Visit (INDEPENDENT_AMBULATORY_CARE_PROVIDER_SITE_OTHER): Payer: Medicare Other

## 2020-10-23 ENCOUNTER — Other Ambulatory Visit: Payer: Self-pay | Admitting: Physician Assistant

## 2020-10-23 ENCOUNTER — Ambulatory Visit (INDEPENDENT_AMBULATORY_CARE_PROVIDER_SITE_OTHER): Payer: Medicare Other | Admitting: Physician Assistant

## 2020-10-23 DIAGNOSIS — M545 Low back pain, unspecified: Secondary | ICD-10-CM | POA: Diagnosis not present

## 2020-10-23 DIAGNOSIS — M79604 Pain in right leg: Secondary | ICD-10-CM | POA: Diagnosis not present

## 2020-10-23 DIAGNOSIS — Z96641 Presence of right artificial hip joint: Secondary | ICD-10-CM | POA: Diagnosis not present

## 2020-10-23 DIAGNOSIS — I6523 Occlusion and stenosis of bilateral carotid arteries: Secondary | ICD-10-CM | POA: Diagnosis not present

## 2020-10-23 DIAGNOSIS — M1612 Unilateral primary osteoarthritis, left hip: Secondary | ICD-10-CM | POA: Diagnosis not present

## 2020-10-23 NOTE — Progress Notes (Signed)
Office Visit Note   Patient: [name]           Date of Birth: [birth year]           MRN: [id number] Visit Date: 10/23/2020              Requested by: Jani Gravel, MD [address] [place],  [place] [id number] PCP: Jani Gravel, MD   Assessment & Plan: Visit Diagnoses:  1. Pain in right leg     Plan: Discussed with [name] that we will try to work on changing her surgical date for the left total hip arthroplasty.  Regards to her low back pain I did discuss with her that this could be causing some of the pain even down into her left hip and thigh region or this could be due to her severe arthritis in the left hip.  In regards to her right hip reassurance was given that her right hip hardware shows no complications on radiographs and that her clinical exam of her right hip appears normal.  The pain in the right buttocks region could also the coming from her back.  Follow-Up Instructions: Return 2 weeks postop.   Orders:  No orders of the defined types were placed in this encounter.  No orders of the defined types were placed in this encounter.     Procedures: No procedures performed   Clinical Data: No additional findings.   Subjective: Chief Complaint  Patient presents with  . Right Hip - Pain  . Right Leg - Pain    HPI [name] comes in today prior to her upcoming left total hip arthroplasty which is to be performed on 10/31/2020.  Unfortunately she has got news today that her son is not expected to live over the next couple of days and is currently in [facility].  She is wanting to see if we can reschedule her surgery for later.  She also is having pain in her right buttocks region she is undergone a right total hip in the past.  States she is having sharp stabbing-like pain.  Wants an x-ray to make sure there is nothing going on with the right hip prior to having left total hip arthroplasty.  The denies any pain down either leg.  Denies any  numbness tingling down either leg.  She does have some back pain.  Review of Systems Negative for fevers chills.  Please see HPI otherwise negative or noncontributory.  Objective: Vital Signs: There were no vitals taken for this visit.  Physical Exam General well-developed well-nourished female no acute distress.  Psych alert and oriented x3 Ortho Exam Right hip good range of motion without pain.  Left hip significant guarding in pain with any attempts of range of motion left hip.  Lower extremity she has +5 strength throughout lower extremities however unable to evaluate left hip flexion against resistance due to patient unable to tolerate due to pain.  Negative straight leg raise on the right unable to perform on the left due to pain. Specialty Comments:  No specialty comments available.  Imaging: XR HIP UNILAT W OR W/O PELVIS 2-3 VIEWS RIGHT  Result Date: 10/23/2020 AP pelvis and lateral view of the right hip shows well seated right total hip arthroplasty with cerclage wire.  There is no acute fractures no bony abnormalities.  Severe end-stage arthritis left hip.  XR Lumbar Spine 2-3 Views  Result Date: 10/23/2020 Lumbar spine: Grade 1 anterior spondylolisthesis L4 on  5.  Facet changes lower lumbar spine.  This space upper lumbar spine are well-maintained.  Decreased disc space at L4-5 and L5-S1.  No acute fractures.    PMFS History: Patient Active Problem List   Diagnosis Date Noted  . Near syncope 07/10/2020  . Hx of CABG 07/10/2020  . Preoperative cardiovascular examination 04/15/2020  . Chest pain 03/18/2020  . Unilateral primary osteoarthritis, left hip 04/03/2019  . History of right hip replacement 04/03/2019  . Status post total replacement of right hip 10/12/2018  . Unilateral primary osteoarthritis, right hip 09/12/2018  . Degenerative joint disease 05/30/2018  . Anal fissure 11/10/2017  . Rectal bleeding 11/10/2017  . Rectal pain 11/10/2017  . Inflammatory  arthritis 10/13/2017  . Latent tuberculosis by blood test 10/13/2017  . Chronic diastolic CHF (congestive heart failure), NYHA class 2 ([place]) 08/11/2017  . Fatigue due to treatment 01/09/2017  . NSTEMI (non-ST elevated myocardial infarction) ([place]) 11/01/2016  . Diabetes mellitus ([place]) 10/24/2015  . Non-insulin dependent type 2 diabetes mellitus ([place]) 10/24/2015  . Angina, class I ([place]) 04/22/2015  . DOE (dyspnea on exertion) 04/22/2015  . Bilateral leg edema 04/22/2015  . Accumulation of fluid in tissues 03/21/2014  . Vertigo, central 01/02/2014  . Vertigo 01/02/2014  . Limb pain- echymosis, pain Lt radial artery after cath 12/11/2013  . Dyslipidemia, goal LDL below 70 05/17/2013  . Essential hypertension 05/17/2013  . Claudication ([place]) 05/16/2013  . PTA/ Stent Rt popliteal and Rt peroneal artery 05/16/13 05/16/2013  . PAD,severe calcified pop and tibial peroneal trunk disease bilateraly   . ABDOMINAL PAIN RIGHT LOWER QUADRANT 10/07/2010  . CVA-STROKE 03/04/2010  . BARRETT'S ESOPHAGUS 03/04/2010  . FECAL INCONTINENCE 03/04/2010  . DM 01/14/2010  . Anemia of chronic disease 01/14/2010  . Atherosclerotic heart disease of native coronary artery with angina pectoris ([place]) 01/14/2010  . DIVERTICULOSIS OF COLON 01/14/2010  . PERSONAL HISTORY OF COLONIC POLYPS 01/14/2010  . [place] ALLERGY 01/14/2010   Past Medical History:  Diagnosis Date  . Allergy to IVP dye   . Angina, class I ([place]) 04/22/2015  . Atherosclerotic heart disease native coronary artery w/angina pectoris ([place]) 1992   a. 1992 s/p PTCA of LAD;  b. 1998 CABG x 3; c. 07/2001 Cath: Sev LM/LAD/LCX dzs, 3/3 patent grafts; d. 11/2013 Cath: stable graft anatomy; e. 10/2016 Cath: native 3VD, VG->OM nl, LIMA->LAD nl, VG->Diag 55.  Marland Kitchen Atherosclerotic heart disease of native coronary artery with angina pectoris ([place]) 01/14/2010   Qualifier: Diagnosis of  By: Deatra Ina MD, Sandy Salaam   . Atrial septal aneurysm   . Barrett's esophagus  03/04/2010   Qualifier: Diagnosis of  By: Trellis Paganini PA-c, Amy S   . Bilateral leg edema 04/22/2015  . Bradycardia, mild briefly to the 40s, asymptomatic 05/16/2013  . Carotid arterial disease ([place])    a. 05/2014 Carotid U/S: No signif bilat ICA stenosis, >60% L ECA.  Marland Kitchen Chronic anemia   . Chronic diastolic CHF (congestive heart failure) ([place])    a. 10/2016 Echo: Ef 55-60%, no rwma, Gr1 DD, triv AI, PASP 42mmHg, atrial septal aneurysm.  . Chronic diastolic CHF (congestive heart failure), NYHA class 2 ([place]) 08/11/2017  . Claudication ([place]) 05/16/2013   Peripheral arterial occlusive disease with lifestyle limiting claudication   . Degenerative joint disease 05/30/2018  . DIVERTICULOSIS OF COLON 01/14/2010   Qualifier: Diagnosis of  By: Deatra Ina MD, Sandy Salaam   . DM (diabetes mellitus), type 2 with peripheral vascular complications ([place])    On Oral medications.  Marland Kitchen  DOE (dyspnea on exertion) 04/22/2015  . Essential hypertension   . FECAL INCONTINENCE 03/04/2010   Qualifier: Diagnosis of  By: Trellis Paganini PA-c, Amy S   . GERD (gastroesophageal reflux disease)   . Hyperlipidemia with target LDL less than 70   . Inflammatory arthritis 10/13/2017  . Non-insulin dependent type 2 diabetes mellitus ([place]) 10/24/2015  . NSTEMI (non-ST elevated myocardial infarction) ([place]) 11/01/2016  . PAD (peripheral artery disease) ([place]) 05/2013; 09/2013   a. severe calcified POP-1 & TP Trunk Dz bilat;  b . 05/2013 Diamondback Rot Athrectomy (R POP) --> PTA R Pop, DES to R Peroneal;  c. 09/2013 PTA/Stenting of L Pop [name] - retrograde access);  d. 04/2014 ABI's: R/L: 1.1/1.1.  Marland Kitchen Preoperative cardiovascular examination 04/15/2020  . PUD (peptic ulcer disease)   . S/P CABG x 3 1998   LIMA-LAD, SVG-OM, SVG-D1  . Severe claudication ([place]) 05/2013   Referred for Peripheral Angio (Dr. Gwenlyn Found --> Dr. Brunetta Jeans in [place])  . [place] ALLERGY 01/14/2010   Qualifier: Diagnosis of  By: Nelson-Smith CMA (AAMA), [name]    . Status post total  replacement of right hip 10/12/2018  . Vertigo 01/02/2014    Family History  Problem Relation Age of Onset  . Hypertension Mother   . Hyperlipidemia Mother   . Hyperlipidemia Father   . Hypertension Father     Past Surgical History:  Procedure Laterality Date  . ATHERECTOMY Right 05/15/2013   Procedure: ATHERECTOMY;  Surgeon: Lorretta Harp, MD;  Location: [facility] CATH LAB;  Service: Cardiovascular;  Laterality: Right;  popliteal  . BACK SURGERY    . CARDIAC CATHETERIZATION  07/17/01   2 v CAD with LM, circ, LAD, obtuse diag, mod stenosis of diag vein graft , mild stenosis of marg vein graft, nl EF, medical treatment  . CARDIAC CATHETERIZATION  11/2013   Widely patent LIMA-LAD & SVG-OM with mild progression of proximal stenosis ~40-50% in SVG-D1; Widely patent native RCA with known severe native LCA disease  . CARDIAC CATHETERIZATION N/A 11/01/2016   Procedure: Left Heart Cath and Cors/Grafts Angiography;  Surgeon: [name], MD;  Location: [place] CV LAB;  Service: Cardiovascular: Hemodynamics: High LVEDP!.  Cors/Grafts: LM 55%, o-p LAD 100% then after D1 -> LIMA-LAD patent, SVG-Diag ~55%. small RI wiht ~70% ostial. O-p Cx 80% - pCx 70% --> SVG-bifurcating OM patent. -- med Rx  . CORONARY ANGIOPLASTY  1992   PCI to LAD  . CORONARY ARTERY BYPASS GRAFT  1998   LIMA-LAD, SVG-diagonal (none roughly 50% stenosis), SVG-OM  . LEFT HEART CATHETERIZATION WITH CORONARY ANGIOGRAM N/A 11/27/2013   Procedure: LEFT HEART CATHETERIZATION WITH CORONARY ANGIOGRAM;  Surgeon: [name], MD;  Location[facility] CATH LAB;  Service: Cardiovascular;  Laterality: N/A;  . LOW EXTREMITY DOPPLERS/ABI  10/2016   Patent right SFA, popliteal and peroneal tendons. Patent left SFA and popliteal stent. 30-50% stenosis in the right common femoral and popliteal arteries, 50-74% stenosis in left profunda femoris artery. --> 1 year follow-up.  RABI - 1.2, LABI 1.1  . LOWER EXTREMITY ANGIOGRAM N/A 02/22/2013   Procedure:  LOWER EXTREMITY ANGIOGRAM;  Surgeon: Lorretta Harp, MD;  Location: [facility] CATH LAB;  Service: Cardiovascular;  Laterality: N/A;  . LOWER EXTREMITY ANGIOGRAM N/A 07/20/2013   Procedure: LOWER EXTREMITY ANGIOGRAM;  Surgeon: Lorretta Harp, MD;  Location: [facility] CATH LAB;  Service: Cardiovascular;  Laterality: N/A;  . NM MYOVIEW LTD  04/03/2013; 5/26'/2016   Lexiscan: a)  Apical perfusion defect with no  ischemia. Consider prior infarct versus breast attenuation.;; b) 5/'16: LOW RISK, Nl EF ~55-65%, No Ischemia /Infarction  . NM MYOVIEW LTD  10/'19; 4/'21   a) LOW RISK.  EF 55%.  No ischemia or infarction. b) EF estimated 81%.  Suboptimal images.  May be subtle inferior ischemia-initially read by radiology as intermediate risk, however overread by cardiology to be mild low risk..  . PERCUTANEOUS STENT INTERVENTION Right 05/15/2013   Procedure: PERCUTANEOUS STENT INTERVENTION;  Surgeon: Lorretta Harp, MD;  Location: [facility] CATH LAB;  Service: Cardiovascular;  Laterality: Right;  tibioperoneal trunk  . Peroneal Artery Stent  05/15/13   Diamondback rot. atherectomy of high grade segmental popliteal stenosis then Chocolate balloonPTA; then angiosculpt PTA of the prox peroneal with stent-Xpedition   . pv angio  07/20/13   high-grade calcified popliteal disease with one-vessel runoff via a small anterior tibial artery that has proximal disease as well, no intervention  . TOTAL HIP ARTHROPLASTY Right 10/12/2018   Procedure: RIGHT TOTAL HIP ARTHROPLASTY ANTERIOR APPROACH;  Surgeon: Mcarthur Rossetti, MD;  Location: [facility] ORS;  Service: Orthopedics;  Laterality: Right;  . TRANSTHORACIC ECHOCARDIOGRAM  11/'17; 10/19   a) EF 55-60%. Gr 1 DD. Normal PAP. Aortic Sclerosis.;; b) Normal LV size and function with vigorous EF 65 to 70%.  GR 1 DD.  Severe LA dilation (suggestive of probably worsening: GR 1 DD).  . TRANSTHORACIC ECHOCARDIOGRAM  03/18/2020   EF 70-75%.  Hyperdynamic.  GR 1 DD.  No R WMA.  Mild LA dilation.  Mild  aortic valve sclerosis but no stenosis.-No change from prior echo  . TUBAL LIGATION     Social History   Occupational History  . Occupation: retired  Tobacco Use  . Smoking status: Never Smoker  . Smokeless tobacco: Never Used  Vaping Use  . Vaping Use: Never used  Substance and Sexual Activity  . Alcohol use: No    Comment: rare glass of wine   . Drug use: No  . Sexual activity: Not on file

## 2020-10-23 NOTE — Patient Instructions (Signed)
DUE TO COVID-19 ONLY ONE VISITOR IS ALLOWED TO COME WITH YOU AND STAY IN THE WAITING ROOM ONLY DURING PRE OP AND PROCEDURE DAY OF SURGERY. THE 1 VISITOR  MAY VISIT WITH YOU AFTER SURGERY IN YOUR PRIVATE ROOM DURING VISITING HOURS ONLY!  YOU NEED TO HAVE A COVID 19 TEST ON: 10/28/20 @           , THIS TEST MUST BE DONE BEFORE SURGERY,  COVID TESTING SITE [place] [id number], IT IS ON THE RIGHT GOING OUT [address] APPROXIMATELY  2 MINUTES PAST [employer] ON THE RIGHT. ONCE YOUR COVID TEST IS COMPLETED,  PLEASE BEGIN THE QUARANTINE INSTRUCTIONS AS OUTLINED IN YOUR HANDOUT.                [name]   Your procedure is scheduled on: 10/31/20   Report to [facility] Main  Entrance   Report to admitting at : 7:15 AM     Call this number if you have problems the morning of surgery [phone]    Remember:   NO SOLID FOOD AFTER MIDNIGHT THE NIGHT PRIOR TO SURGERY. NOTHING BY MOUTH EXCEPT CLEAR LIQUIDS UNTIL: 6:45 AM . PLEASE FINISH GATORADE DRINK PER SURGEON ORDER  WHICH NEEDS TO BE COMPLETED AT: 6:45 AM .  CLEAR LIQUID DIET   Foods Allowed                                                                     Foods Excluded  Coffee and tea, regular and decaf                             liquids that you cannot  Plain Jell-O any favor except red or purple                                           see through such as: Fruit ices (not with fruit pulp)                                     milk, soups, orange juice  Iced Popsicles                                    All solid food Carbonated beverages, regular and diet                                    Cranberry, grape and apple juices Sports drinks like Gatorade Lightly seasoned clear broth or consume(fat free) Sugar, honey syrup  Sample Menu Breakfast                                Lunch  Supper Cranberry juice                    Beef broth                             Chicken broth Jell-O                                     Grape juice                           Apple juice Coffee or tea                        Jell-O                                      Popsicle                                                Coffee or tea                        Coffee or tea  _____________________________________________________________________   BRUSH YOUR TEETH MORNING OF SURGERY AND RINSE YOUR MOUTH OUT, NO CHEWING GUM CANDY OR MINTS.     Take these medicines the morning of surgery with A SIP OF WATER: carvedilol,isosorbide,gabapentin,pantoprazole,sulfasalazine. Ativan as needed.  How to Manage Your Diabetes Before and After Surgery  Why is it important to control my blood sugar before and after surgery? . Improving blood sugar levels before and after surgery helps healing and can limit problems. . A way of improving blood sugar control is eating a healthy diet by: o  Eating less sugar and carbohydrates o  Increasing activity/exercise o  Talking with your doctor about reaching your blood sugar goals . High blood sugars (greater than 180 mg/dL) can raise your risk of infections and slow your recovery, so you will need to focus on controlling your diabetes during the weeks before surgery. . Make sure that the doctor who takes care of your diabetes knows about your planned surgery including the date and location.  How do I manage my blood sugar before surgery? . Check your blood sugar at least 4 times a day, starting 2 days before surgery, to make sure that the level is not too high or low. o Check your blood sugar the morning of your surgery when you wake up and every 2 hours until you get to the Short Stay unit. . If your blood sugar is less than 70 mg/dL, you will need to treat for low blood sugar: o Do not take insulin. o Treat a low blood sugar (less than 70 mg/dL) with  cup of clear juice (cranberry or apple), 4 glucose tablets, OR glucose  gel. o Recheck blood sugar in 15 minutes after treatment (to make sure it is greater than 70 mg/dL). If your blood sugar is not greater than 70 mg/dL on recheck, call [phone] for further instructions. . Report your blood sugar to the short stay nurse when you get to Short Stay.  Marland Kitchen  If you are admitted to the hospital after surgery: o Your blood sugar will be checked by the staff and you will probably be given insulin after surgery (instead of oral diabetes medicines) to make sure you have good blood sugar levels. o The goal for blood sugar control after surgery is 80-180 mg/dL.   WHAT DO I DO ABOUT MY DIABETES MEDICATION?  Marland Kitchen Do not take oral diabetes medicines (pills) the morning of surgery.  . THE DAY BEFORE SURGERY, take Amaryl as usual       . THE MORNING OF SURGERY, DO NOT take  Amaryl.  DO NOT TAKE ANY DIABETIC MEDICATIONS DAY OF YOUR SURGERY                               You may not have any metal on your body including hair pins and              piercings  Do not wear jewelry, make-up, lotions, powders or perfumes, deodorant             Do not wear nail polish on your fingernails.  Do not shave  48 hours prior to surgery.           Do not bring valuables to the hospital. [place].  Contacts, dentures or bridgework may not be worn into surgery.  Leave suitcase in the car. After surgery it may be brought to your room.     Patients discharged the day of surgery will not be allowed to drive home. IF YOU ARE HAVING SURGERY AND GOING HOME THE SAME DAY, YOU MUST HAVE AN ADULT TO DRIVE YOU HOME AND BE WITH YOU FOR 24 HOURS. YOU MAY GO HOME BY TAXI OR UBER OR ORTHERWISE, BUT AN ADULT MUST ACCOMPANY YOU HOME AND STAY WITH YOU FOR 24 HOURS.  Name and phone number of your driver:  Special Instructions: N/A              Please read over the following fact sheets you were  given: _____________________________________________________________________        [facility] - Preparing for Surgery Before surgery, you can play an important role.  Because skin is not sterile, your skin needs to be as free of germs as possible.  You can reduce the number of germs on your skin by washing with CHG (chlorahexidine gluconate) soap before surgery.  CHG is an antiseptic cleaner which kills germs and bonds with the skin to continue killing germs even after washing. Please DO NOT use if you have an allergy to CHG or antibacterial soaps.  If your skin becomes reddened/irritated stop using the CHG and inform your nurse when you arrive at Short Stay. Do not shave (including legs and underarms) for at least 48 hours prior to the first CHG shower.  You may shave your face/neck. Please follow these instructions carefully:  1.  Shower with CHG Soap the night before surgery and the  morning of Surgery.  2.  If you choose to wash your hair, wash your hair first as usual with your  normal  shampoo.  3.  After you shampoo, rinse your hair and body thoroughly to remove the  shampoo.  4.  Use CHG as you would any other liquid soap.  You can apply chg directly  to the skin and wash                       Gently with a scrungie or clean washcloth.  5.  Apply the CHG Soap to your body ONLY FROM THE NECK DOWN.   Do not use on face/ open                           Wound or open sores. Avoid contact with eyes, ears mouth and genitals (private parts).                       Wash face,  Genitals (private parts) with your normal soap.             6.  Wash thoroughly, paying special attention to the area where your surgery  will be performed.  7.  Thoroughly rinse your body with warm water from the neck down.  8.  DO NOT shower/wash with your normal soap after using and rinsing off  the CHG Soap.                9.  Pat yourself dry with a clean towel.            10.  Wear clean  pajamas.            11.  Place clean sheets on your bed the night of your first shower and do not  sleep with pets. Day of Surgery : Do not apply any lotions/deodorants the morning of surgery.  Please wear clean clothes to the hospital/surgery center.  FAILURE TO FOLLOW THESE INSTRUCTIONS MAY RESULT IN THE CANCELLATION OF YOUR SURGERY PATIENT SIGNATURE_________________________________  NURSE SIGNATURE__________________________________  ________________________________________________________________________   Adam Phenix  An incentive spirometer is a tool that can help keep your lungs clear and active. This tool measures how well you are filling your lungs with each breath. Taking long deep breaths may help reverse or decrease the chance of developing breathing (pulmonary) problems (especially infection) following:  A long period of time when you are unable to move or be active. BEFORE THE PROCEDURE   If the spirometer includes an indicator to show your best effort, your nurse or respiratory therapist will set it to a desired goal.  If possible, sit up straight or lean slightly forward. Try not to slouch.  Hold the incentive spirometer in an upright position. INSTRUCTIONS FOR USE  1. Sit on the edge of your bed if possible, or sit up as far as you can in bed or on a chair. 2. Hold the incentive spirometer in an upright position. 3. Breathe out normally. 4. Place the mouthpiece in your mouth and seal your lips tightly around it. 5. Breathe in slowly and as deeply as possible, raising the piston or the ball toward the top of the column. 6. Hold your breath for 3-5 seconds or for as long as possible. Allow the piston or ball to fall to the bottom of the column. 7. Remove the mouthpiece from your mouth and breathe out normally. 8. Rest for a few seconds and repeat Steps 1 through 7 at least 10 times every 1-2 hours when you are awake. Take your time and take a few normal breaths  between deep breaths. 9. The spirometer may include an indicator to  show your best effort. Use the indicator as a goal to work toward during each repetition. 10. After each set of 10 deep breaths, practice coughing to be sure your lungs are clear. If you have an incision (the cut made at the time of surgery), support your incision when coughing by placing a pillow or rolled up towels firmly against it. Once you are able to get out of bed, walk around indoors and cough well. You may stop using the incentive spirometer when instructed by your caregiver.  RISKS AND COMPLICATIONS  Take your time so you do not get dizzy or light-headed.  If you are in pain, you may need to take or ask for pain medication before doing incentive spirometry. It is harder to take a deep breath if you are having pain. AFTER USE  Rest and breathe slowly and easily.  It can be helpful to keep track of a log of your progress. Your caregiver can provide you with a simple table to help with this. If you are using the spirometer at home, follow these instructions: [place] IF:   You are having difficultly using the spirometer.  You have trouble using the spirometer as often as instructed.  Your pain medication is not giving enough relief while using the spirometer.  You develop fever of 100.5 F (38.1 C) or higher. SEEK IMMEDIATE MEDICAL CARE IF:   You cough up bloody sputum that had not been present before.  You develop fever of 102 F (38.9 C) or greater.  You develop worsening pain at or near the incision site. MAKE SURE YOU:   Understand these instructions.  Will watch your condition.  Will get help right away if you are not doing well or get worse. Document Released: 04/11/2007 Document Revised: 02/21/2012 Document Reviewed: 06/12/2007 [facility] Patient Information 2014 [place], [place].   ________________________________________________________________________

## 2020-10-24 ENCOUNTER — Encounter (HOSPITAL_COMMUNITY)
Admission: RE | Admit: 2020-10-24 | Discharge: 2020-10-24 | Disposition: A | Discharge status: Home / Self Care | Payer: Medicare Other | Source: Ambulatory Visit | Attending: Anesthesiology | Admitting: Anesthesiology

## 2020-10-28 ENCOUNTER — Telehealth: Payer: Self-pay

## 2020-10-28 NOTE — Telephone Encounter (Signed)
Tylenol # 3 makes her feel sick and dizzy.  Can you Rx something else for pain?  She uses [facility] on [address].

## 2020-10-29 ENCOUNTER — Other Ambulatory Visit: Payer: Self-pay | Admitting: Orthopaedic Surgery

## 2020-10-29 MED ORDER — TRAMADOL HCL 50 MG PO TABS
50.0000 mg | ORAL_TABLET | Freq: Four times a day (QID) | ORAL | 0 refills | Status: DC | PRN
Start: 2020-10-29 — End: 2020-12-04

## 2020-10-29 NOTE — Telephone Encounter (Signed)
I sent in some tramadol

## 2020-10-29 NOTE — Telephone Encounter (Signed)
Please advise 

## 2020-10-29 NOTE — Telephone Encounter (Signed)
LMOM for patient letting her know we called Rx into her pharmacy

## 2020-11-13 ENCOUNTER — Inpatient Hospital Stay: Payer: Medicare Other | Admitting: Orthopaedic Surgery

## 2020-11-14 NOTE — Patient Instructions (Addendum)
DUE TO COVID-19 ONLY ONE VISITOR IS ALLOWED TO COME WITH YOU AND STAY IN THE WAITING ROOM ONLY DURING PRE OP AND PROCEDURE DAY OF SURGERY.  THE 1 VISITOR  MAY VISIT WITH YOU AFTER SURGERY IN YOUR PRIVATE ROOM DURING VISITING HOURS ONLY!  YOU NEED TO HAVE A COVID 19 TEST ON_12/7______ @_1 :50______, THIS TEST MUST BE DONE BEFORE SURGERY,  COVID TESTING SITE [place] [id number], IT IS ON THE RIGHT GOING OUT [address] APPROXIMATELY  2 MINUTES PAST [employer] ON THE RIGHT.  ONCE YOUR COVID TEST IS COMPLETED,  PLEASE BEGIN THE QUARANTINE INSTRUCTIONS AS OUTLINED IN YOUR HANDOUT.                [name]    Your procedure is scheduled on: 11/21/20   Report to [facility] Main  Entrance   Report to admitting at  6:00 AM     Call this number if you have problems the morning of surgery [phone]   . BRUSH YOUR TEETH MORNING OF SURGERY AND RINSE YOUR MOUTH OUT, NO CHEWING GUM CANDY OR MINTS.   No food after midnight.    You may have clear liquid until 5:30 AM.    At 5:00 AM drink pre surgery drink.   Nothing by mouth after 5:30 AM.   Take these medicines the morning of surgery with A SIP OF WATER: Imdur, Gabapentin, Carvedilol, Prednisone, Protonix         How to Manage Your Diabetes Before and After Surgery  Why is it important to control my blood sugar before and after surgery? . Improving blood sugar levels before and after surgery helps healing and can limit problems. . A way of improving blood sugar control is eating a healthy diet by: o  Eating less sugar and carbohydrates o  Increasing activity/exercise o  Talking with your doctor about reaching your blood sugar goals . High blood sugars (greater than 180 mg/dL) can raise your risk of infections and slow your recovery, so you will need to focus on controlling your diabetes during the weeks before surgery. . Make sure that the doctor who takes care of your diabetes knows about  your planned surgery including the date and location.  How do I manage my blood sugar before surgery? . Check your blood sugar at least 4 times a day, starting 2 days before surgery, to make sure that the level is not too high or low. o Check your blood sugar the morning of your surgery when you wake up and every 2 hours until you get to the Short Stay unit. . If your blood sugar is less than 70 mg/dL, you will need to treat for low blood sugar: o Do not take insulin. o Treat a low blood sugar (less than 70 mg/dL) with  cup of clear juice (cranberry or apple), 4 glucose tablets, OR glucose gel. o Recheck blood sugar in 15 minutes after treatment (to make sure it is greater than 70 mg/dL). If your blood sugar is not greater than 70 mg/dL on recheck, call [phone] for further instructions. . Report your blood sugar to the short stay nurse when you get to Short Stay.  . If you are admitted to the hospital after surgery: o Your blood sugar will be checked by the staff and you will probably be given insulin after surgery (instead of oral diabetes medicines) to make sure you have good blood sugar levels. o The goal for  blood sugar control after surgery is 80-180 mg/dL.   WHAT DO I DO ABOUT MY DIABETES MEDICATION?  Marland Kitchen Do not take oral diabetes medicines (pills) the morning of surgery.                       You may not have any metal on your body including hair pins and              piercings  Do not wear jewelry, make-up, lotions, powders or perfumes, deodorant             Do not wear nail polish on your fingernails.  Do not shave  48 hours prior to surgery.          .   Do not bring valuables to the hospital. [place].  Contacts, dentures or bridgework may not be worn into surgery.    _____________________________________________________________________             [facility] - Preparing for Surgery Before surgery, you can play an  important role.   Because skin is not sterile, your skin needs to be as free of germs as possible.   You can reduce the number of germs on your skin by washing with CHG (chlorahexidine gluconate) soap before surgery.   CHG is an antiseptic cleaner which kills germs and bonds with the skin to continue killing germs even after washing. Please DO NOT use if you have an allergy to CHG or antibacterial soaps.   If your skin becomes reddened/irritated stop using the CHG and inform your nurse when you arrive at Short Stay. Do not shave (including legs and underarms) for at least 48 hours prior to the first CHG shower.   Please follow these instructions carefully:  1.  Shower with CHG Soap the night before surgery and the  morning of Surgery.  2.  If you choose to wash your hair, wash your hair first as usual with your  normal  shampoo.  3.  After you shampoo, rinse your hair and body thoroughly to remove the  shampoo.                                        4.  Use CHG as you would any other liquid soap.  You can apply chg directly  to the skin and wash                       Gently with a scrungie or clean washcloth.  5.  Apply the CHG Soap to your body ONLY FROM THE NECK DOWN.   Do not use on face/ open                           Wound or open sores. Avoid contact with eyes, ears mouth and genitals (private parts).                       Wash face,  Genitals (private parts) with your normal soap.             6.  Wash thoroughly, paying special attention to the area where your surgery  will be performed.  7.  Thoroughly rinse  your body with warm water from the neck down.  8.  DO NOT shower/wash with your normal soap after using and rinsing off  the CHG Soap.             9.  Pat yourself dry with a clean towel.            10.  Wear clean pajamas.            11.  Place clean sheets on your bed the night of your first shower and do not  sleep with pets. Day of Surgery : Do not apply any lotions/deodorants  the morning of surgery.  Please wear clean clothes to the hospital/surgery center.  FAILURE TO FOLLOW THESE INSTRUCTIONS MAY RESULT IN THE CANCELLATION OF YOUR SURGERY PATIENT SIGNATURE_________________________________  NURSE SIGNATURE__________________________________  ________________________________________________________________________   Adam Phenix  An incentive spirometer is a tool that can help keep your lungs clear and active. This tool measures how well you are filling your lungs with each breath. Taking long deep breaths may help reverse or decrease the chance of developing breathing (pulmonary) problems (especially infection) following:  A long period of time when you are unable to move or be active. BEFORE THE PROCEDURE   If the spirometer includes an indicator to show your best effort, your nurse or respiratory therapist will set it to a desired goal.  If possible, sit up straight or lean slightly forward. Try not to slouch.  Hold the incentive spirometer in an upright position. INSTRUCTIONS FOR USE  1. Sit on the edge of your bed if possible, or sit up as far as you can in bed or on a chair. 2. Hold the incentive spirometer in an upright position. 3. Breathe out normally. 4. Place the mouthpiece in your mouth and seal your lips tightly around it. 5. Breathe in slowly and as deeply as possible, raising the piston or the ball toward the top of the column. 6. Hold your breath for 3-5 seconds or for as long as possible. Allow the piston or ball to fall to the bottom of the column. 7. Remove the mouthpiece from your mouth and breathe out normally. 8. Rest for a few seconds and repeat Steps 1 through 7 at least 10 times every 1-2 hours when you are awake. Take your time and take a few normal breaths between deep breaths. 9. The spirometer may include an indicator to show your best effort. Use the indicator as a goal to work toward during each repetition. 10. After  each set of 10 deep breaths, practice coughing to be sure your lungs are clear. If you have an incision (the cut made at the time of surgery), support your incision when coughing by placing a pillow or rolled up towels firmly against it. Once you are able to get out of bed, walk around indoors and cough well. You may stop using the incentive spirometer when instructed by your caregiver.  RISKS AND COMPLICATIONS  Take your time so you do not get dizzy or light-headed.  If you are in pain, you may need to take or ask for pain medication before doing incentive spirometry. It is harder to take a deep breath if you are having pain. AFTER USE  Rest and breathe slowly and easily.  It can be helpful to keep track of a log of your progress. Your caregiver can provide you with a simple table to help with this. If you are using the spirometer at home, follow these  instructions: SEEK MEDICAL CARE IF:   You are having difficultly using the spirometer.  You have trouble using the spirometer as often as instructed.  Your pain medication is not giving enough relief while using the spirometer.  You develop fever of 100.5 F (38.1 C) or higher. SEEK IMMEDIATE MEDICAL CARE IF:   You cough up bloody sputum that had not been present before.  You develop fever of 102 F (38.9 C) or greater.  You develop worsening pain at or near the incision site. MAKE SURE YOU:   Understand these instructions.  Will watch your condition.  Will get help right away if you are not doing well or get worse. Document Released: 04/11/2007 Document Revised: 02/21/2012 Document Reviewed: 06/12/2007 [facility] Patient Information 2014 [place], [place].   ________________________________________________________________________

## 2020-11-17 ENCOUNTER — Emergency Department (HOSPITAL_COMMUNITY)
Admission: EM | Admit: 2020-11-17 | Discharge: 2020-11-17 | Disposition: A | Discharge status: Home / Self Care | Payer: Medicare Other | Attending: Emergency Medicine | Admitting: Emergency Medicine

## 2020-11-17 ENCOUNTER — Other Ambulatory Visit: Payer: Self-pay

## 2020-11-17 ENCOUNTER — Emergency Department (HOSPITAL_COMMUNITY): Payer: Medicare Other

## 2020-11-17 ENCOUNTER — Encounter (HOSPITAL_COMMUNITY): Payer: Self-pay

## 2020-11-17 ENCOUNTER — Encounter (HOSPITAL_COMMUNITY)
Admission: RE | Admit: 2020-11-17 | Discharge: 2020-11-17 | Disposition: A | Discharge status: Home / Self Care | Payer: Medicare Other | Source: Ambulatory Visit | Attending: Orthopaedic Surgery | Admitting: Orthopaedic Surgery

## 2020-11-17 DIAGNOSIS — E119 Type 2 diabetes mellitus without complications: Secondary | ICD-10-CM | POA: Diagnosis not present

## 2020-11-17 DIAGNOSIS — I5032 Chronic diastolic (congestive) heart failure: Secondary | ICD-10-CM | POA: Insufficient documentation

## 2020-11-17 DIAGNOSIS — I110 Hypertensive heart disease with heart failure: Secondary | ICD-10-CM | POA: Diagnosis not present

## 2020-11-17 DIAGNOSIS — Z7902 Long term (current) use of antithrombotics/antiplatelets: Secondary | ICD-10-CM | POA: Insufficient documentation

## 2020-11-17 DIAGNOSIS — Z91040 Latex allergy status: Secondary | ICD-10-CM | POA: Diagnosis not present

## 2020-11-17 DIAGNOSIS — M1612 Unilateral primary osteoarthritis, left hip: Secondary | ICD-10-CM | POA: Insufficient documentation

## 2020-11-17 DIAGNOSIS — I2510 Atherosclerotic heart disease of native coronary artery without angina pectoris: Secondary | ICD-10-CM | POA: Diagnosis not present

## 2020-11-17 DIAGNOSIS — Z7901 Long term (current) use of anticoagulants: Secondary | ICD-10-CM | POA: Diagnosis not present

## 2020-11-17 DIAGNOSIS — I639 Cerebral infarction, unspecified: Secondary | ICD-10-CM | POA: Insufficient documentation

## 2020-11-17 DIAGNOSIS — Z96641 Presence of right artificial hip joint: Secondary | ICD-10-CM | POA: Diagnosis not present

## 2020-11-17 DIAGNOSIS — R9431 Abnormal electrocardiogram [ECG] [EKG]: Secondary | ICD-10-CM | POA: Diagnosis not present

## 2020-11-17 DIAGNOSIS — Z7952 Long term (current) use of systemic steroids: Secondary | ICD-10-CM | POA: Diagnosis not present

## 2020-11-17 DIAGNOSIS — Z01812 Encounter for preprocedural laboratory examination: Secondary | ICD-10-CM | POA: Insufficient documentation

## 2020-11-17 DIAGNOSIS — Z79899 Other long term (current) drug therapy: Secondary | ICD-10-CM | POA: Insufficient documentation

## 2020-11-17 DIAGNOSIS — I959 Hypotension, unspecified: Secondary | ICD-10-CM

## 2020-11-17 DIAGNOSIS — E1151 Type 2 diabetes mellitus with diabetic peripheral angiopathy without gangrene: Secondary | ICD-10-CM | POA: Insufficient documentation

## 2020-11-17 DIAGNOSIS — Z7984 Long term (current) use of oral hypoglycemic drugs: Secondary | ICD-10-CM | POA: Insufficient documentation

## 2020-11-17 DIAGNOSIS — Z951 Presence of aortocoronary bypass graft: Secondary | ICD-10-CM | POA: Diagnosis not present

## 2020-11-17 LAB — URINALYSIS, ROUTINE W REFLEX MICROSCOPIC
Bilirubin Urine: NEGATIVE
Glucose, UA: NEGATIVE mg/dL
Hgb urine dipstick: NEGATIVE
Ketones, ur: 5 mg/dL — AB
Nitrite: NEGATIVE
Protein, ur: NEGATIVE mg/dL
Specific Gravity, Urine: 1.019 (ref 1.005–1.030)
pH: 6 (ref 5.0–8.0)

## 2020-11-17 LAB — CBC WITH DIFFERENTIAL/PLATELET
Abs Immature Granulocytes: 0.02 10*3/uL (ref 0.00–0.07)
Basophils Absolute: 0 10*3/uL (ref 0.0–0.1)
Basophils Relative: 1 %
Eosinophils Absolute: 0.1 10*3/uL (ref 0.0–0.5)
Eosinophils Relative: 2 %
HCT: 38 % (ref 36.0–46.0)
Hemoglobin: 11.8 g/dL — ABNORMAL LOW (ref 12.0–15.0)
Immature Granulocytes: 0 %
Lymphocytes Relative: 22 %
Lymphs Abs: 1.5 10*3/uL (ref 0.7–4.0)
MCH: 28.6 pg (ref 26.0–34.0)
MCHC: 31.1 g/dL (ref 30.0–36.0)
MCV: 92.2 fL (ref 80.0–100.0)
Monocytes Absolute: 0.7 10*3/uL (ref 0.1–1.0)
Monocytes Relative: 10 %
Neutro Abs: 4.6 10*3/uL (ref 1.7–7.7)
Neutrophils Relative %: 65 %
Platelets: 277 10*3/uL (ref 150–400)
RBC: 4.12 MIL/uL (ref 3.87–5.11)
RDW: 14.1 % (ref 11.5–15.5)
WBC: 6.9 10*3/uL (ref 4.0–10.5)
nRBC: 0 % (ref 0.0–0.2)

## 2020-11-17 LAB — TROPONIN I (HIGH SENSITIVITY)
Troponin I (High Sensitivity): 7 ng/L (ref <18)
Troponin I (High Sensitivity): 6 ng/L (ref <18)

## 2020-11-17 LAB — BASIC METABOLIC PANEL
Anion gap: 10 (ref 5–15)
BUN: 18 mg/dL (ref 8–23)
CO2: 29 mmol/L (ref 22–32)
Calcium: 9.5 mg/dL (ref 8.9–10.3)
Chloride: 99 mmol/L (ref 98–111)
Creatinine, Ser: 0.99 mg/dL (ref 0.44–1.00)
GFR, Estimated: 56 mL/min — ABNORMAL LOW (ref >60)
Glucose, Bld: 169 mg/dL — ABNORMAL HIGH (ref 70–99)
Potassium: 4.1 mmol/L (ref 3.5–5.1)
Sodium: 138 mmol/L (ref 135–145)

## 2020-11-17 LAB — COMPREHENSIVE METABOLIC PANEL
ALT: 14 U/L (ref 0–44)
AST: 28 U/L (ref 15–41)
Albumin: 4 g/dL (ref 3.5–5.0)
Alkaline Phosphatase: 41 U/L (ref 38–126)
Anion gap: 10 (ref 5–15)
BUN: 18 mg/dL (ref 8–23)
CO2: 28 mmol/L (ref 22–32)
Calcium: 9.5 mg/dL (ref 8.9–10.3)
Chloride: 100 mmol/L (ref 98–111)
Creatinine, Ser: 1.04 mg/dL — ABNORMAL HIGH (ref 0.44–1.00)
GFR, Estimated: 53 mL/min — ABNORMAL LOW (ref >60)
Glucose, Bld: 170 mg/dL — ABNORMAL HIGH (ref 70–99)
Potassium: 4.5 mmol/L (ref 3.5–5.1)
Sodium: 138 mmol/L (ref 135–145)
Total Bilirubin: 0.4 mg/dL (ref 0.3–1.2)
Total Protein: 7.6 g/dL (ref 6.5–8.1)

## 2020-11-17 LAB — LIPASE, BLOOD: Lipase: 27 U/L (ref 11–51)

## 2020-11-17 LAB — HEMOGLOBIN A1C
Hgb A1c MFr Bld: 7.3 % — ABNORMAL HIGH (ref 4.8–5.6)
Mean Plasma Glucose: 162.81 mg/dL

## 2020-11-17 LAB — CBG MONITORING, ED: Glucose-Capillary: 161 mg/dL — ABNORMAL HIGH (ref 70–99)

## 2020-11-17 LAB — GLUCOSE, CAPILLARY: Glucose-Capillary: 165 mg/dL — ABNORMAL HIGH (ref 70–99)

## 2020-11-17 LAB — MAGNESIUM: Magnesium: 2.2 mg/dL (ref 1.7–2.4)

## 2020-11-17 MED ORDER — PREDNISONE 5 MG PO TABS
5.0000 mg | ORAL_TABLET | Freq: Once | ORAL | Status: AC
  Administered 2020-11-17: 5 mg via ORAL
  Filled 2020-11-17: qty 1

## 2020-11-17 MED ORDER — SODIUM CHLORIDE 0.9 % IV BOLUS
500.0000 mL | Freq: Once | INTRAVENOUS | Status: AC
  Administered 2020-11-17: 500 mL via INTRAVENOUS

## 2020-11-17 NOTE — Progress Notes (Signed)
Pt   c/o feeling hot and "a little nauseated" when she came into the PAT office at 1:30 pm. She reported that she didn't eat lunch but did have breakfast. CBG was 165 at 1:38.  The pt said her sugar was unusually high this AM in the 190s.  She was becoming more diaphoretic and less responsive. . BP was 84/62 HR 88, resp rate 18,)2 sat 95%, Temp 87.6.Marland Kitchen Jessica Zanetto anesthesia PA was called to the room. Pt second BP was 66/44. Pt was pale , clammy and less responsive.  Janett Billow said to take the Pt to the ED. Charge nurse was called but no answer. Daughter was called to accompany her mother to the ED. I wheeled her to ED triage but no nurse was available and was told to take her to the admitting desk with daughter. Pt was unable to talk to her daughter.  The daughter was concerned that she was having a stroke.  Report was given to Charge nurse and pt taken to the back  to a room.

## 2020-11-17 NOTE — ED Triage Notes (Signed)
Patient's daughter reports that the patient was at pre surgery and "got sick". Patient's daughter stated that she had slurred speech and not acting herself. In triage the patient states she felt nausea and felt faint. Patient alert and oriented in triage  BP- 92/57, HR-69, sats-96,r-14 CBG-161

## 2020-11-17 NOTE — ED Provider Notes (Signed)
[facility] DEPT Provider Note   CSN: [id number] Arrival date & time: 11/17/20  1327     History Chief Complaint  Patient presents with  . possible stroke    [name] is a 86 y.o. female.  HPI   86 year old female past medical history of CAD status post angioplasty and bypass, HTN, HLD, DM, CHF presents the emergency department with feeling unwell.  Initial history obtained by the patient's daughter.  She is scheduled this Friday to have a revision of left hip arthroplasty.  She was at an appointment today to have preop blood work done.  The daughter states that she dropped her off in her baseline status and was waiting in the car when the nurse from the facility called her saying that her mother's blood pressure was very low and that she was unwell and she was taking her to the emergency department.  When the daughter came in and saw her mother she says that her "eyes were glazed over, she was slow to respond".  The patient remembers this event, she states that she felt weak and like she was going to faint with nausea.  Denies any chest pain or heaviness.  The daughter states since arriving at triage the speech seems back to normal.  The patient denies any headache, vision changes, focal weakness/sensory change.  Past Medical History:  Diagnosis Date  . Allergy to IVP dye   . Angina, class I ([place]) 04/22/2015  . Atherosclerotic heart disease native coronary artery w/angina pectoris ([place]) 1992   a. 1992 s/p PTCA of LAD;  b. 1998 CABG x 3; c. 07/2001 Cath: Sev LM/LAD/LCX dzs, 3/3 patent grafts; d. 11/2013 Cath: stable graft anatomy; e. 10/2016 Cath: native 3VD, VG->OM nl, LIMA->LAD nl, VG->Diag 55.  Marland Kitchen Atherosclerotic heart disease of native coronary artery with angina pectoris ([place]) 01/14/2010   Qualifier: Diagnosis of  By: Deatra Ina MD, Sandy Salaam   . Atrial septal aneurysm   . Barrett's esophagus 03/04/2010   Qualifier: Diagnosis of  By: Trellis Paganini PA-c, Amy S    . Bilateral leg edema 04/22/2015  . Bradycardia, mild briefly to the 40s, asymptomatic 05/16/2013  . Carotid arterial disease ([place])    a. 05/2014 Carotid U/S: No signif bilat ICA stenosis, >60% L ECA.  Marland Kitchen Chronic anemia   . Chronic diastolic CHF (congestive heart failure) ([place])    a. 10/2016 Echo: Ef 55-60%, no rwma, Gr1 DD, triv AI, PASP 7mmHg, atrial septal aneurysm.  . Chronic diastolic CHF (congestive heart failure), NYHA class 2 ([place]) 08/11/2017  . Claudication ([place]) 05/16/2013   Peripheral arterial occlusive disease with lifestyle limiting claudication   . Degenerative joint disease 05/30/2018  . DIVERTICULOSIS OF COLON 01/14/2010   Qualifier: Diagnosis of  By: Deatra Ina MD, Sandy Salaam   . DM (diabetes mellitus), type 2 with peripheral vascular complications ([place])    On Oral medications.  . DOE (dyspnea on exertion) 04/22/2015  . Essential hypertension   . FECAL INCONTINENCE 03/04/2010   Qualifier: Diagnosis of  By: Trellis Paganini PA-c, Amy S   . GERD (gastroesophageal reflux disease)   . Hyperlipidemia with target LDL less than 70   . Inflammatory arthritis 10/13/2017  . Non-insulin dependent type 2 diabetes mellitus ([place]) 10/24/2015  . NSTEMI (non-ST elevated myocardial infarction) ([place]) 11/01/2016  . PAD (peripheral artery disease) ([place]) 05/2013; 09/2013   a. severe calcified POP-1 & TP Trunk Dz bilat;  b . 05/2013 Diamondback Rot Athrectomy (R POP) --> PTA R Pop, DES  to R Peroneal;  c. 09/2013 PTA/Stenting of L Pop [name] - retrograde access);  d. 04/2014 ABI's: R/L: 1.1/1.1.  Marland Kitchen Preoperative cardiovascular examination 04/15/2020  . PUD (peptic ulcer disease)   . S/P CABG x 3 1998   LIMA-LAD, SVG-OM, SVG-D1  . Severe claudication ([place]) 05/2013   Referred for Peripheral Angio (Dr. Gwenlyn Found --> Dr. Brunetta Jeans in [place])  . [place] ALLERGY 01/14/2010   Qualifier: Diagnosis of  By: Nelson-Smith CMA (AAMA), [name]    . Status post total replacement of right hip 10/12/2018  . Vertigo 01/02/2014     Patient Active Problem List   Diagnosis Date Noted  . Near syncope 07/10/2020  . Hx of CABG 07/10/2020  . Preoperative cardiovascular examination 04/15/2020  . Chest pain 03/18/2020  . Unilateral primary osteoarthritis, left hip 04/03/2019  . History of right hip replacement 04/03/2019  . Status post total replacement of right hip 10/12/2018  . Unilateral primary osteoarthritis, right hip 09/12/2018  . Degenerative joint disease 05/30/2018  . Anal fissure 11/10/2017  . Rectal bleeding 11/10/2017  . Rectal pain 11/10/2017  . Inflammatory arthritis 10/13/2017  . Latent tuberculosis by blood test 10/13/2017  . Chronic diastolic CHF (congestive heart failure), NYHA class 2 ([place]) 08/11/2017  . Fatigue due to treatment 01/09/2017  . NSTEMI (non-ST elevated myocardial infarction) ([place]) 11/01/2016  . Diabetes mellitus ([place]) 10/24/2015  . Non-insulin dependent type 2 diabetes mellitus ([place]) 10/24/2015  . Angina, class I ([place]) 04/22/2015  . DOE (dyspnea on exertion) 04/22/2015  . Bilateral leg edema 04/22/2015  . Accumulation of fluid in tissues 03/21/2014  . Vertigo, central 01/02/2014  . Vertigo 01/02/2014  . Limb pain- echymosis, pain Lt radial artery after cath 12/11/2013  . Dyslipidemia, goal LDL below 70 05/17/2013  . Essential hypertension 05/17/2013  . Claudication ([place]) 05/16/2013  . PTA/ Stent Rt popliteal and Rt peroneal artery 05/16/13 05/16/2013  . PAD,severe calcified pop and tibial peroneal trunk disease bilateraly   . ABDOMINAL PAIN RIGHT LOWER QUADRANT 10/07/2010  . CVA-STROKE 03/04/2010  . BARRETT'S ESOPHAGUS 03/04/2010  . FECAL INCONTINENCE 03/04/2010  . DM 01/14/2010  . Anemia of chronic disease 01/14/2010  . Atherosclerotic heart disease of native coronary artery with angina pectoris ([place]) 01/14/2010  . DIVERTICULOSIS OF COLON 01/14/2010  . PERSONAL HISTORY OF COLONIC POLYPS 01/14/2010  . [place] ALLERGY 01/14/2010    Past Surgical History:  Procedure  Laterality Date  . ATHERECTOMY Right 05/15/2013   Procedure: ATHERECTOMY;  Surgeon: Lorretta Harp, MD;  Location: [facility] CATH LAB;  Service: Cardiovascular;  Laterality: Right;  popliteal  . BACK SURGERY    . CARDIAC CATHETERIZATION  07/17/01   2 v CAD with LM, circ, LAD, obtuse diag, mod stenosis of diag vein graft , mild stenosis of marg vein graft, nl EF, medical treatment  . CARDIAC CATHETERIZATION  11/2013   Widely patent LIMA-LAD & SVG-OM with mild progression of proximal stenosis ~40-50% in SVG-D1; Widely patent native RCA with known severe native LCA disease  . CARDIAC CATHETERIZATION N/A 11/01/2016   Procedure: Left Heart Cath and Cors/Grafts Angiography;  Surgeon: [name], MD;  Location: [facility] CV LAB;  Service: Cardiovascular: Hemodynamics: High LVEDP!.  Cors/Grafts: LM 55%, o-p LAD 100% then after D1 -> LIMA-LAD patent, SVG-Diag ~55%. small RI wiht ~70% ostial. O-p Cx 80% - pCx 70% --> SVG-bifurcating OM patent. -- med Rx  . CORONARY ANGIOPLASTY  1992   PCI to LAD  . [place]  LIMA-LAD, SVG-diagonal (none roughly 50% stenosis), SVG-OM  . LEFT HEART CATHETERIZATION WITH CORONARY ANGIOGRAM N/A 11/27/2013   Procedure: LEFT HEART CATHETERIZATION WITH CORONARY ANGIOGRAM;  Surgeon: [name], MD;  Location: [facility] CATH LAB;  Service: Cardiovascular;  Laterality: N/A;  . LOW EXTREMITY DOPPLERS/ABI  10/2016   Patent right SFA, popliteal and peroneal tendons. Patent left SFA and popliteal stent. 30-50% stenosis in the right common femoral and popliteal arteries, 50-74% stenosis in left profunda femoris artery. --> 1 year follow-up.  RABI - 1.2, LABI 1.1  . LOWER EXTREMITY ANGIOGRAM N/A 02/22/2013   Procedure: LOWER EXTREMITY ANGIOGRAM;  Surgeon: Lorretta Harp, MD;  Location: [facility] CATH LAB;  Service: Cardiovascular;  Laterality: N/A;  . LOWER EXTREMITY ANGIOGRAM N/A 07/20/2013   Procedure: LOWER EXTREMITY ANGIOGRAM;  Surgeon: Lorretta Harp, MD;  Location:  [facility] CATH LAB;  Service: Cardiovascular;  Laterality: N/A;  . NM MYOVIEW LTD  04/03/2013; 5/26'/2016   Lexiscan: a)  Apical perfusion defect with no ischemia. Consider prior infarct versus breast attenuation.;; b) 5/'16: LOW RISK, Nl EF ~55-65%, No Ischemia /Infarction  . NM MYOVIEW LTD  10/'19; 4/'21   a) LOW RISK.  EF 55%.  No ischemia or infarction. b) EF estimated 81%.  Suboptimal images.  May be subtle inferior ischemia-initially read by radiology as intermediate risk, however overread by cardiology to be mild low risk..  . PERCUTANEOUS STENT INTERVENTION Right 05/15/2013   Procedure: PERCUTANEOUS STENT INTERVENTION;  Surgeon: Lorretta Harp, MD;  Location: [facility] CATH LAB;  Service: Cardiovascular;  Laterality: Right;  tibioperoneal trunk  . Peroneal Artery Stent  05/15/13   Diamondback rot. atherectomy of high grade segmental popliteal stenosis then [place] balloonPTA; then angiosculpt PTA of the prox peroneal with stent-Xpedition   . pv angio  07/20/13   high-grade calcified popliteal disease with one-vessel runoff via a small anterior tibial artery that has proximal disease as well, no intervention  . TOTAL HIP ARTHROPLASTY Right 10/12/2018   Procedure: RIGHT TOTAL HIP ARTHROPLASTY ANTERIOR APPROACH;  Surgeon: Mcarthur Rossetti, MD;  Location: [facility] ORS;  Service: Orthopedics;  Laterality: Right;  . TRANSTHORACIC ECHOCARDIOGRAM  11/'17; 10/19   a) EF 55-60%. Gr 1 DD. Normal PAP. Aortic Sclerosis.;; b) Normal LV size and function with vigorous EF 65 to 70%.  GR 1 DD.  Severe LA dilation (suggestive of probably worsening: GR 1 DD).  . TRANSTHORACIC ECHOCARDIOGRAM  03/18/2020   EF 70-75%.  Hyperdynamic.  GR 1 DD.  No R WMA.  Mild LA dilation.  Mild aortic valve sclerosis but no stenosis.-No change from prior echo  . TUBAL LIGATION       OB History   No obstetric history on file.     Family History  Problem Relation Age of Onset  . Hypertension Mother   . Hyperlipidemia Mother   .  Hyperlipidemia Father   . Hypertension Father     Social History   Tobacco Use  . Smoking status: Never Smoker  . Smokeless tobacco: Never Used  Vaping Use  . Vaping Use: Never used  Substance Use Topics  . Alcohol use: Not Currently    Comment: rare glass of wine   . Drug use: No    Home Medications Prior to Admission medications   Medication Sig Start Date End Date Taking? Authorizing Provider  acetaminophen (TYLENOL) 500 MG tablet Take 1 tablet (500 mg total) by mouth daily as needed for moderate pain. LEG PAIN. PLEASE DISCUSS STARTING A MEDICATION FOR PAIN MANAGEMENT  WITH YOUR PCP. Patient taking differently: Take 500 mg by mouth daily as needed for moderate pain.  06/26/19   Lorretta Harp, MD  acetaminophen-codeine (TYLENOL #3) 300-30 MG tablet Take 1-2 tablets by mouth every 8 (eight) hours as needed for moderate pain. 10/10/20   Mcarthur Rossetti, MD  carvedilol (COREG) 6.25 MG tablet Take 1 tablet (6.25 mg total) by mouth 2 (two) times daily. 04/08/20 07/07/20  Lorretta Harp, MD  Cholecalciferol (VITAMIN D3) 10 MCG (400 UNIT) tablet Take 400 Units by mouth daily.    [provider]  clopidogrel (PLAVIX) 75 MG tablet Take 75 mg by mouth daily.    [provider]  Coenzyme Q10 (CO Q 10 PO) Take 1 tablet by mouth every other day.     [provider]  cyanocobalamin 1000 MCG tablet Take 1,000 mcg by mouth daily.    [provider]  doxycycline (VIBRA-TABS) 100 MG tablet Take 100 mg by mouth 2 (two) times daily. 07/15/20   [provider]  ezetimibe (ZETIA) 10 MG tablet Take 10 mg by mouth daily.    [provider]  folic acid (FOLVITE) 1 MG tablet Take 1 mg by mouth daily. 08/21/18   [provider]  furosemide (LASIX) 40 MG tablet Take 40 Mg (1 tablet) daily Patient taking differently: Take 20 mg by mouth daily.  03/04/20   Lorretta Harp, MD  gabapentin (NEURONTIN) 300 MG capsule TAKE 1 CAPSULE BY MOUTH  THREE TIMES A DAY Patient taking differently: Take 300 mg by mouth 3 (three) times daily.  01/01/19   Wallene Huh, DPM  gabapentin (NEURONTIN) 400 MG capsule Take 400 mg by mouth 3 (three) times daily. 06/09/20   [provider]  glimepiride (AMARYL) 1 MG tablet Take 1 mg by mouth daily with breakfast.  05/02/18   [provider]  isosorbide mononitrate (IMDUR) 60 MG 24 hr tablet TAKE 1.5 TABLETS (90 MG TOTAL) BY MOUTH DAILY. Marland Kitchen Patient taking differently: Take 60 mg by mouth daily. . 12/21/19   Lorretta Harp, MD  LORazepam (ATIVAN) 0.5 MG tablet Take 0.5 mg by mouth every 6 (six) hours as needed for anxiety or sleep.  03/17/18   [provider]  methocarbamol (ROBAXIN) 500 MG tablet Take 1 tablet (500 mg total) by mouth every 8 (eight) hours as needed for muscle spasms. 03/19/20   Lavina Hamman, MD  methotrexate (RHEUMATREX) 2.5 MG tablet Take 20 mg by mouth once a week. Every Monday of each week 08/21/18   [provider]  nitroGLYCERIN (NITROSTAT) 0.4 MG SL tablet PLACE 1 TABLET (0.4 MG TOTAL) UNDER THE TONGUE EVERY 5 (FIVE) MINUTES AS NEEDED FOR CHEST PAIN. 02/13/18   Theora Gianotti, NP  OneTouch Delica Lancets 21H [place]  08/13/20   [provider]  pantoprazole (PROTONIX) 40 MG tablet Take 1 tablet (40 mg total) by mouth 2 (two) times daily before a meal. 03/19/20   Lavina Hamman, MD  predniSONE (DELTASONE) 5 MG tablet Take 5 mg by mouth daily with breakfast.     [provider]  rosuvastatin (CRESTOR) 40 MG tablet Take 1 tablet (40 mg total) by mouth at bedtime. Patient taking differently: Take 20 mg by mouth daily.  04/08/20   Lorretta Harp, MD  sulfaSALAzine (AZULFIDINE) 500 MG EC tablet Take 500 mg by mouth 2 (two) times daily.  05/29/18   [provider]  traMADol (ULTRAM) 50 MG tablet Take 1-2 tablets (50-100  mg total) by mouth every 6 (six) hours as needed. 10/29/20   Mcarthur Rossetti, MD    Allergies    Fish  allergy, Ivp dye [iodinated diagnostic agents], Latex, Shellfish allergy, Shellfish-derived products, Other, and Metformin  Review of Systems   Review of Systems  Constitutional: Positive for fatigue. Negative for chills and fever.  HENT: Negative for congestion.   Eyes: Negative for visual disturbance.  Respiratory: Negative for chest tightness and shortness of breath.   Cardiovascular: Negative for chest pain.  Gastrointestinal: Positive for nausea. Negative for abdominal pain, diarrhea and vomiting.  Genitourinary: Negative for dysuria.  Skin: Negative for rash.  Neurological: Negative for seizures, syncope, facial asymmetry, weakness, numbness and headaches.       Slow speech  Psychiatric/Behavioral: Positive for confusion.    Physical Exam Updated Vital Signs BP (!) 151/73 Comment: lying back in a recliner.  Pulse 71   Temp 98.1 F (36.7 C) (Oral)   Resp 16   Ht 5\' 3"  (1.6 m)   Wt 65.8 kg   SpO2 99%   BMI 25.69 kg/m   Physical Exam Vitals and nursing note reviewed.  Constitutional:      General: She is not in acute distress.    Appearance: Normal appearance.  HENT:     Head: Normocephalic.     Mouth/Throat:     Mouth: Mucous membranes are dry.  Eyes:     Extraocular Movements: Extraocular movements intact.     Pupils: Pupils are equal, round, and reactive to light.  Cardiovascular:     Rate and Rhythm: Normal rate.  Pulmonary:     Effort: Pulmonary effort is normal. No respiratory distress.  Abdominal:     Palpations: Abdomen is soft.     Tenderness: There is no abdominal tenderness.  Musculoskeletal:     Comments: +left knee and hip pain  Skin:    General: Skin is warm.  Neurological:     Mental Status: She is alert and oriented to person, place, and time.     Cranial Nerves: No cranial nerve deficit.     Sensory: No sensory deficit.     Motor: No weakness.  Psychiatric:        Mood and Affect: Mood normal.     ED Results / Procedures / Treatments    Labs (all labs ordered are listed, but only abnormal results are displayed) Labs Reviewed  CBG MONITORING, ED - Abnormal; Notable for the following components:      Result Value   Glucose-Capillary 161 (*)    All other components within normal limits  CBC WITH DIFFERENTIAL/PLATELET  COMPREHENSIVE METABOLIC PANEL  LIPASE, BLOOD  URINALYSIS, ROUTINE W REFLEX MICROSCOPIC  TROPONIN I (HIGH SENSITIVITY)    EKG None  Radiology No results found.  Procedures Procedures (including critical care time)  Medications Ordered in ED Medications  sodium chloride 0.9 % bolus 500 mL (500 mLs Intravenous New Bag/Given 11/17/20 1424)    ED Course  I have reviewed the triage vital signs and the nursing notes.  Pertinent labs & imaging results that were available during my care of the patient were reviewed by me and considered in my medical decision making (see chart for details).  Clinical Course as of Nov 18 1615  Mon Nov 17, 2020  1554 EKG shows sinus rhythm.  Patient's baseline heart rate is between 50-60, this is unchanged today.  No acute ischemic changes.  Since laying the patient down and improvement in her blood pressure  patient has had no further nausea.  Continues to deny any chest pain, chest heaviness or neuro symptoms.  She is baseline neurologically.   [KH]  1615 Orthostatic vital signs do show a drop with change of positions.  Blood work is reassuring, no acute abnormalities, initial troponin is negative.  Patient is pending repeat troponin, magnesium and urinalysis.  She will eat and drink and we will ambulate the patient.   [KH]    Clinical Course User Index [KH] Pastor Sgro, Alvin Critchley, DO   MDM Rules/Calculators/A&P                          On arrival patient is hypotensive with blood pressure 92/57.  She is sitting up, leaning forward, complaining of nausea.  Her speech is slow but not aphasic or slurred.  She has a symmetrical face, she has left lower extremity weakness but  this is stemming from left hip and knee chronic pain/injury for which she was scheduled to have surgery for this Friday.  Denies any headache or other acute neuro symptoms.  No chest pain or shortness of breath. We laid the patient supine and her BP improved to 174 systolic. After a few minutes the patient significantly improved, her nausea subsided and she feels back to baseline. Daughter at bedside confirms patient is back to baseline. Low suspicion for TIA/CVA given the main complaint and hypotensive presentation with resolution when BP improved. Patient admits that she only had yogurt for breakfast which is less than she normally has and she skipped lunch as her blood draw appointment was at 1 PM.  Patient was just reevaluated.  She is resting comfortably in bed.  Heart rate remains between 50 and 60, blood pressure is stable.  She is back to baseline with no other complaints.  Daughter reveals that she has had episodes prior like this earlier in the year where she shows up to an appointment, her blood pressure drops and she feels nauseous.  They have not found a specific etiology for this.  I never witnessed any slurred speech or neurologic deficit to suggest CVA/TIA.  I think her symptoms were all stemming from her hypotension, initial blood pressure was 66/44.  Patient has gotten a small fluid bolus, she is eating and drinking.  We will do a 90-minute troponin, urinalysis, magnesium and ambulate the patient.  If these return unremarkable and she ambulates and remains in baseline neurologic status with no new complaints I think the patient can be followed up as an outpatient and her symptoms attributed to episode of hypotension.  Medications will need to be reevaluated by primary/cardiac doctors to ensure that they are not contributing to these episodes.  Patient signed out to Dr. Ralene Bathe @ 16000. Final Clinical Impression(s) / ED Diagnoses Final diagnoses:  None    Rx / DC Orders ED Discharge Orders     None       Lorelle Gibbs, DO 11/17/20 1617

## 2020-11-17 NOTE — ED Provider Notes (Signed)
Patient care assumed at 1600. Patient here for evaluation following hypotensive episode with slurred speech. She is now at her baseline. Repeat troponin, UA pending.  Troponin is stable, magnesium is within normal limits. UA is not consistent with UTI. On assessment patient is asymptomatic and resting comfortably in the room. Plan to discharge home with outpatient follow-up and return precautions.   Quintella Reichert, MD 11/17/20 [phone]

## 2020-11-17 NOTE — ED Notes (Signed)
Pt given water and informed that we need a urine sample. Pt states that she will try to give [place] a sample soon.

## 2020-11-18 ENCOUNTER — Other Ambulatory Visit: Payer: Self-pay

## 2020-11-18 ENCOUNTER — Encounter: Payer: Self-pay | Admitting: Podiatry

## 2020-11-18 ENCOUNTER — Encounter (HOSPITAL_COMMUNITY)
Admission: RE | Admit: 2020-11-18 | Discharge: 2020-11-18 | Disposition: A | Discharge status: Home / Self Care | Payer: Medicare Other | Source: Ambulatory Visit | Attending: Orthopaedic Surgery | Admitting: Orthopaedic Surgery

## 2020-11-18 ENCOUNTER — Other Ambulatory Visit (HOSPITAL_COMMUNITY)
Admission: RE | Admit: 2020-11-18 | Discharge: 2020-11-18 | Disposition: A | Discharge status: Home / Self Care | Payer: Medicare Other | Source: Ambulatory Visit | Attending: Orthopaedic Surgery | Admitting: Orthopaedic Surgery

## 2020-11-18 ENCOUNTER — Ambulatory Visit (INDEPENDENT_AMBULATORY_CARE_PROVIDER_SITE_OTHER): Payer: Medicare Other | Admitting: Podiatry

## 2020-11-18 ENCOUNTER — Encounter (HOSPITAL_COMMUNITY): Payer: Self-pay

## 2020-11-18 ENCOUNTER — Inpatient Hospital Stay (HOSPITAL_COMMUNITY): Admission: RE | Admit: 2020-11-18 | Payer: Medicare Other | Source: Ambulatory Visit

## 2020-11-18 DIAGNOSIS — Z20822 Contact with and (suspected) exposure to covid-19: Secondary | ICD-10-CM | POA: Insufficient documentation

## 2020-11-18 DIAGNOSIS — Z01812 Encounter for preprocedural laboratory examination: Secondary | ICD-10-CM | POA: Insufficient documentation

## 2020-11-18 DIAGNOSIS — M79675 Pain in left toe(s): Secondary | ICD-10-CM | POA: Diagnosis not present

## 2020-11-18 DIAGNOSIS — B351 Tinea unguium: Secondary | ICD-10-CM | POA: Diagnosis not present

## 2020-11-18 DIAGNOSIS — M79674 Pain in right toe(s): Secondary | ICD-10-CM

## 2020-11-18 DIAGNOSIS — I739 Peripheral vascular disease, unspecified: Secondary | ICD-10-CM

## 2020-11-18 DIAGNOSIS — E1151 Type 2 diabetes mellitus with diabetic peripheral angiopathy without gangrene: Secondary | ICD-10-CM

## 2020-11-18 LAB — SURGICAL PCR SCREEN
MRSA, PCR: NEGATIVE
Staphylococcus aureus: POSITIVE — AB

## 2020-11-18 LAB — SARS CORONAVIRUS 2 (TAT 6-24 HRS): SARS Coronavirus 2: NEGATIVE

## 2020-11-18 LAB — GLUCOSE, CAPILLARY: Glucose-Capillary: 219 mg/dL — ABNORMAL HIGH (ref 70–99)

## 2020-11-18 NOTE — Progress Notes (Signed)
This patient returns to my office for at risk foot care.  This patient requires this care by a professional since this patient will be at risk due to having diabetes,  PAD  and coagulation defect.  Patient is taking plavix. Patient says she has painful long thick nails especially her big toenail right foot.  Patient is scheduled for hip surgery.  She also has a painful callus third toe right foot.  This patient is unable to cut nails herself since the patient cannot reach her nails.These nails are painful walking and wearing shoes.  This patient presents for at risk foot care today.  General Appearance  Alert, conversant and in no acute stress.  Vascular  Dorsalis pedis and posterior tibial  pulses are not  palpable  bilaterally. Absent digital hair  B/L.  Capillary return is within normal limits  bilaterally. Temperature is within normal limits  bilaterally.  Neurologic  Senn-Weinstein monofilament wire test within normal limits  bilaterally. Muscle power within normal limits bilaterally.  Nails Thick disfigured discolored nails with subungual debris  from hallux to fifth toes bilaterally. No evidence of bacterial infection or drainage bilaterally.  Orthopedic  No limitations of motion  feet .  No crepitus or effusions noted.  No bony pathology or digital deformities noted.  Skin  normotropic skin with no porokeratosis noted bilaterally.  No signs of infections or ulcers noted.   Corn noted on her third toe right foot.  Onychomycosis  Pain in right toes  Pain in left toes  Corn third toe right foot.  Consent was obtained for treatment procedures.   Mechanical debridement of nails 1-5  bilaterally performed with a nail nipper.  Filed with dremel without incident. Debride corn with # 15 blade.   Return office visit    3 months                 Told patient to return for periodic foot care and evaluation due to potential at risk complications.   Gardiner Barefoot DPM

## 2020-11-18 NOTE — Progress Notes (Signed)
COVID Vaccine Completed:Yes Date COVID Vaccine completed:01/23/20-booster 10/11/20 COVID vaccine manufacturer: Pfizer     PCP - Dr. Theda Sers Cardiologist - [name]  Chest x-ray - no EKG - 07/11/20-Epic Stress Test - 2019-epic ECHO - 03/18/20-epic Cardiac Cath - [phone], 2017-epic Pacemaker/ICD device last checked:NA  Sleep Study - no CPAP -   Fasting Blood Sugar - 120-123 Checks Blood Sugar _____ times a day  Blood Thinner Instructions:Plavix/ [place] Aspirin Instructions:stop 7 days prior/[place] Last Dose:11/11/20  Anesthesia review:   Patient denies shortness of breath, fever, cough and chest pain at PAT appointment Yes  Patient verbalized understanding of instructions that were given to them at the PAT appointment. Patient was also instructed that they will need to review over the PAT instructions again at home before surgery.Yes  Pt doesn't climb stairs. She lives with her daughter.No SOB with ADLs. Her CBG was 219 at PAT visit . I told her daughter to call her Dr. So that they can regulate it. She lost a son a few weeks ago and feels that it is due to stress.

## 2020-11-19 ENCOUNTER — Ambulatory Visit (INDEPENDENT_AMBULATORY_CARE_PROVIDER_SITE_OTHER): Payer: Medicare Other | Admitting: Cardiovascular Disease

## 2020-11-19 ENCOUNTER — Encounter: Payer: Self-pay | Admitting: Cardiovascular Disease

## 2020-11-19 DIAGNOSIS — I739 Peripheral vascular disease, unspecified: Secondary | ICD-10-CM | POA: Diagnosis not present

## 2020-11-19 DIAGNOSIS — I6523 Occlusion and stenosis of bilateral carotid arteries: Secondary | ICD-10-CM | POA: Diagnosis not present

## 2020-11-19 NOTE — Progress Notes (Signed)
Anesthesia Chart Review   Case: [id number] Date/Time: 11/21/20 0815   Procedure: LEFT TOTAL HIP ARTHROPLASTY ANTERIOR APPROACH (Left Hip)   Anesthesia type: General   Pre-op diagnosis: Osteoarthritis Left Hip   Location: [facility] ROOM 10 / [facility]   Surgeons: Mcarthur Rossetti, MD      DISCUSSION:86 y.o. never smoker with h/o GERD, PVD, CHF, CABG, DM II, HTN, left hip OA scheduled for above procedure 11/21/2020 with Dr. Jean Rosenthal.   Pt last seen by cardiologist 04/21/2020 for preoperative evaluation.  Per OV note, "See below.  Basely would be class II to class III risk with her history for hip surgery.  No further testing required.  She has just had a Myoview and echocardiogram check. Okay to hold Plavix 5-7 days preop surgery.  With big hip surgery likely would hold for 7 days."  Pt hypotensive, confused, experiencing nausea during PAT visit.  Pt taken to ED with daughter for evaluation.  Improvement with fluids, negative troponin. Discharged home with outpatient follow up.  Pt sent to ED with similar sx from cardiology office 07/10/20, improvement with IV fluids, follow up with PCP recommended.    Seen by cardiology 11/19/2020. Per OV note, "She was admitted to the hospital on 03/18/2020 with chest pain. She ruled out for myocardial infarction. A Myoview stress test was read as low risk with mild inferior ischemia. She also had lower extremity arterial Doppler studies performed in our office 03/31/2020 which revealed a right ABI of 0.39 with an occluded right popliteal artery and a left of 1.12. I cleared her for her left total hip replacement low risk given her recent Myoview stress test results. She does not really complain of lifestyle limiting claudication on the right. Unfortunately her left total hip replacement was delayed because of COVID-19 and is scheduled for 2 days from now.  She had a low risk Myoview stress test.  She had a right total knee replacement back in 2019.  She  denies chest pain or shortness of breath."   VS: BP 91/63   Pulse 95   Temp 36.7 C (Oral)   Resp 20   Ht 5\' 3"  (1.6 m)   Wt 65.7 kg   SpO2 100%   BMI 25.66 kg/m   PROVIDERS: Jani Gravel, MD is PCP   Quay Burow, MD is Cardiologist  LABS: Labs reviewed: Acceptable for surgery. (all labs ordered are listed, but only abnormal results are displayed)  Labs Reviewed  SURGICAL PCR SCREEN - Abnormal; Notable for the following components:      Result Value   Staphylococcus aureus POSITIVE (*)    All other components within normal limits  BASIC METABOLIC PANEL - Abnormal; Notable for the following components:   Glucose, Bld 169 (*)    GFR, Estimated 56 (*)    All other components within normal limits  HEMOGLOBIN A1C - Abnormal; Notable for the following components:   Hgb A1c MFr Bld 7.3 (*)    All other components within normal limits  GLUCOSE, CAPILLARY - Abnormal; Notable for the following components:   Glucose-Capillary 165 (*)    All other components within normal limits     IMAGES:   EKG: 11/18/2020 Rate 59 bpm  Sinus rhythm   CV: Stress Test 03/19/2020 IMPRESSION: 1. The SPECT acquisition is not optimal due to some rotational differences in appearance of the left ventricle between stress and rest acquisitions. There is suggestion of probable infarcts involving the left ventricular apex, septum and lateral wall.  There may be subtle inducible ischemia in the inferior wall.  2. Normal left ventricular wall motion.  3. Left ventricular ejection fraction 81%  4. Non invasive risk stratification*: Intermediate  Echo 03/18/2020 IMPRESSIONS    1. Left ventricular ejection fraction, by estimation, is 70 to 75%. The  left ventricle has hyperdynamic function. The left ventricle has no  regional wall motion abnormalities. There is mild concentric left  ventricular hypertrophy. Left ventricular  diastolic parameters are consistent with Grade I diastolic  dysfunction  (impaired relaxation).  2. Right ventricular systolic function is normal. The right ventricular  size is normal. There is normal pulmonary artery systolic pressure.  3. Left atrial size was mildly dilated.  4. The mitral valve is normal in structure. No evidence of mitral valve  regurgitation.  5. The aortic valve is tricuspid. Aortic valve regurgitation is not  visualized. Mild aortic valve sclerosis is present, with no evidence of  aortic valve stenosis.  6. The inferior vena cava is normal in size with greater than 50%  respiratory variability, suggesting right atrial pressure of 3 mmHg.  Stress Test 09/15/2018  The left ventricular ejection fraction is mildly decreased (45-54%).  Nuclear stress EF: 52%.  No T wave inversion was noted during stress.  There was no ST segment deviation noted during stress.  This is a low risk study.   Normal perfusion. LVEF 52% with normal wall motion. This is a low risk study.  Past Medical History:  Diagnosis Date  . Allergy to IVP dye   . Angina, class I ([place]) 04/22/2015  . Atherosclerotic heart disease native coronary artery w/angina pectoris ([place]) 1992   a. 1992 s/p PTCA of LAD;  b. 1998 CABG x 3; c. 07/2001 Cath: Sev LM/LAD/LCX dzs, 3/3 patent grafts; d. 11/2013 Cath: stable graft anatomy; e. 10/2016 Cath: native 3VD, VG->OM nl, LIMA->LAD nl, VG->Diag 55.  Marland Kitchen Atherosclerotic heart disease of native coronary artery with angina pectoris ([place]) 01/14/2010   Qualifier: Diagnosis of  By: Deatra Ina MD, Sandy Salaam   . Atrial septal aneurysm   . Barrett's esophagus 03/04/2010   Qualifier: Diagnosis of  By: Trellis Paganini PA-c, Amy S   . Bilateral leg edema 04/22/2015  . Bradycardia, mild briefly to the 40s, asymptomatic 05/16/2013  . Carotid arterial disease ([place])    a. 05/2014 Carotid U/S: No signif bilat ICA stenosis, >60% L ECA.  Marland Kitchen Chronic anemia   . Chronic diastolic CHF (congestive heart failure) ([place])    a. 10/2016 Echo: Ef 55-60%, no  rwma, Gr1 DD, triv AI, PASP 67mmHg, atrial septal aneurysm.  . Chronic diastolic CHF (congestive heart failure), NYHA class 2 ([place]) 08/11/2017  . Claudication ([place]) 05/16/2013   Peripheral arterial occlusive disease with lifestyle limiting claudication   . Degenerative joint disease 05/30/2018  . DIVERTICULOSIS OF COLON 01/14/2010   Qualifier: Diagnosis of  By: Deatra Ina MD, Sandy Salaam   . DM (diabetes mellitus), type 2 with peripheral vascular complications ([place])    On Oral medications.  . DOE (dyspnea on exertion) 04/22/2015  . Essential hypertension   . FECAL INCONTINENCE 03/04/2010   Qualifier: Diagnosis of  By: Trellis Paganini PA-c, Amy S   . GERD (gastroesophageal reflux disease)   . Hyperlipidemia with target LDL less than 70   . Inflammatory arthritis 10/13/2017  . Non-insulin dependent type 2 diabetes mellitus ([place]) 10/24/2015  . NSTEMI (non-ST elevated myocardial infarction) ([place]) 11/01/2016  . PAD (peripheral artery disease) ([place]) 05/2013; 09/2013   a. severe calcified POP-1 & TP  Trunk Dz bilat;  b . 05/2013 Diamondback Rot Athrectomy (R POP) --> PTA R Pop, DES to R Peroneal;  c. 09/2013 PTA/Stenting of L Pop ([facility] - retrograde access);  d. 04/2014 ABI's: R/L: 1.1/1.1.  Marland Kitchen Preoperative cardiovascular examination 04/15/2020  . PUD (peptic ulcer disease)   . S/P CABG x 3 1998   LIMA-LAD, SVG-OM, SVG-D1  . Severe claudication ([place]) 05/2013   Referred for Peripheral Angio (Dr. Gwenlyn Found --> Dr. Brunetta Jeans in [place])  . [place] ALLERGY 01/14/2010   Qualifier: Diagnosis of  By: Nelson-Smith CMA (AAMA), [name]    . Status post total replacement of right hip 10/12/2018  . Vertigo 01/02/2014    Past Surgical History:  Procedure Laterality Date  . ATHERECTOMY Right 05/15/2013   Procedure: ATHERECTOMY;  Surgeon: Lorretta Harp, MD;  Location: [facility] CATH LAB;  Service: Cardiovascular;  Laterality: Right;  popliteal  . BACK SURGERY    . CARDIAC CATHETERIZATION  07/17/01   2 v CAD with LM, circ, LAD, obtuse diag,  mod stenosis of diag vein graft , mild stenosis of marg vein graft, nl EF, medical treatment  . CARDIAC CATHETERIZATION  11/2013   Widely patent LIMA-LAD & SVG-OM with mild progression of proximal stenosis ~40-50% in SVG-D1; Widely patent native RCA with known severe native LCA disease  . CARDIAC CATHETERIZATION N/A 11/01/2016   Procedure: Left Heart Cath and Cors/Grafts Angiography;  Surgeon: [name], MD;  Location: [place] CV LAB;  Service: Cardiovascular: Hemodynamics: High LVEDP!.  Cors/Grafts: LM 55%, o-p LAD 100% then after D1 -> LIMA-LAD patent, SVG-Diag ~55%. small RI wiht ~70% ostial. O-p Cx 80% - pCx 70% --> SVG-bifurcating OM patent. -- med Rx  . CORONARY ANGIOPLASTY  1992   PCI to LAD  . CORONARY ARTERY BYPASS GRAFT  1998   LIMA-LAD, SVG-diagonal (none roughly 50% stenosis), SVG-OM  . LEFT HEART CATHETERIZATION WITH CORONARY ANGIOGRAM N/A 11/27/2013   Procedure: LEFT HEART CATHETERIZATION WITH CORONARY ANGIOGRAM;  Surgeon: [name], MD;  Location: [facility] CATH LAB;  Service: Cardiovascular;  Laterality: N/A;  . LOW EXTREMITY DOPPLERS/ABI  10/2016   Patent right SFA, popliteal and peroneal tendons. Patent left SFA and popliteal stent. 30-50% stenosis in the right common femoral and popliteal arteries, 50-74% stenosis in left profunda femoris artery. --> 1 year follow-up.  RABI - 1.2, LABI 1.1  . LOWER EXTREMITY ANGIOGRAM N/A 02/22/2013   Procedure: LOWER EXTREMITY ANGIOGRAM;  Surgeon: Lorretta Harp, MD;  Location: [facility] CATH LAB;  Service: Cardiovascular;  Laterality: N/A;  . LOWER EXTREMITY ANGIOGRAM N/A 07/20/2013   Procedure: LOWER EXTREMITY ANGIOGRAM;  Surgeon: Lorretta Harp, MD;  Location: [facility] CATH LAB;  Service: Cardiovascular;  Laterality: N/A;  . NM MYOVIEW LTD  04/03/2013; 5/26'/2016   Lexiscan: a)  Apical perfusion defect with no ischemia. Consider prior infarct versus breast attenuation.;; b) 5/'16: LOW RISK, Nl EF ~55-65%, No Ischemia /Infarction  . NM MYOVIEW  LTD  10/'19; 4/'21   a) LOW RISK.  EF 55%.  No ischemia or infarction. b) EF estimated 81%.  Suboptimal images.  May be subtle inferior ischemia-initially read by radiology as intermediate risk, however overread by cardiology to be mild low risk..  . PERCUTANEOUS STENT INTERVENTION Right 05/15/2013   Procedure: PERCUTANEOUS STENT INTERVENTION;  Surgeon: Lorretta Harp, MD;  Location: [facility] CATH LAB;  Service: Cardiovascular;  Laterality: Right;  tibioperoneal trunk  . Peroneal Artery Stent  05/15/13   Diamondback rot. atherectomy of high grade segmental popliteal  stenosis then Chocolate balloonPTA; then angiosculpt PTA of the prox peroneal with stent-Xpedition   . pv angio  07/20/13   high-grade calcified popliteal disease with one-vessel runoff via a small anterior tibial artery that has proximal disease as well, no intervention  . TOTAL HIP ARTHROPLASTY Right 10/12/2018   Procedure: RIGHT TOTAL HIP ARTHROPLASTY ANTERIOR APPROACH;  Surgeon: Mcarthur Rossetti, MD;  [facility];  Service: Orthopedics;  Laterality: Right;  . TRANSTHORACIC ECHOCARDIOGRAM  11/'17; 10/19   a) EF 55-60%. Gr 1 DD. Normal PAP. Aortic Sclerosis.;; b) Normal LV size and function with vigorous EF 65 to 70%.  GR 1 DD.  Severe LA dilation (suggestive of probably worsening: GR 1 DD).  . TRANSTHORACIC ECHOCARDIOGRAM  03/18/2020   EF 70-75%.  Hyperdynamic.  GR 1 DD.  No R WMA.  Mild LA dilation.  Mild aortic valve sclerosis but no stenosis.-No change from prior echo  . TUBAL LIGATION      MEDICATIONS: . acetaminophen (TYLENOL) 500 MG tablet  . acetaminophen-codeine (TYLENOL #3) 300-30 MG tablet  . carvedilol (COREG) 6.25 MG tablet  . Cholecalciferol (VITAMIN D3) 10 MCG (400 UNIT) tablet  . clopidogrel (PLAVIX) 75 MG tablet  . Coenzyme Q10 (CO Q 10 PO)  . cyanocobalamin 1000 MCG tablet  . doxycycline (VIBRA-TABS) 100 MG tablet  . ezetimibe (ZETIA) 10 MG tablet  . folic acid (FOLVITE) 1 MG tablet  . furosemide (LASIX)  40 MG tablet  . gabapentin (NEURONTIN) 300 MG capsule  . gabapentin (NEURONTIN) 400 MG capsule  . glimepiride (AMARYL) 1 MG tablet  . isosorbide mononitrate (IMDUR) 60 MG 24 hr tablet  . LORazepam (ATIVAN) 0.5 MG tablet  . methocarbamol (ROBAXIN) 500 MG tablet  . methotrexate (RHEUMATREX) 2.5 MG tablet  . nitroGLYCERIN (NITROSTAT) 0.4 MG SL tablet  . OneTouch Delica Lancets 39R MISC  . pantoprazole (PROTONIX) 40 MG tablet  . predniSONE (DELTASONE) 5 MG tablet  . rosuvastatin (CRESTOR) 40 MG tablet  . sulfaSALAzine (AZULFIDINE) 500 MG EC tablet  . traMADol (ULTRAM) 50 MG tablet   No current facility-administered medications for this encounter.   Konrad Felix, PA-C [facility] [phone]

## 2020-11-19 NOTE — Patient Instructions (Signed)

## 2020-11-19 NOTE — Assessment & Plan Note (Signed)
Ms. Nordling returns today for follow-up of PAD.  I performed peripheral intervention on her 05/15/2013 of her right above-the-knee popliteal artery as well as right peroneal artery.  She only had one-vessel runoff.  I later sent her to [facility] and Dr. Brunetta Jeans performed complex left popliteal and tibioperoneal trunk revascularization 10/01/2013 with improvement in her left ABI.  Her most recent Dopplers performed 03/31/2020 revealed a right ABI of 0.39 and a left 1.12.  She had an occluded right popliteal artery as well as anterior tibial artery.  Her left popliteal stent appears patent although she did have a high-frequency signal in her distal left common femoral artery.  She is scheduled for left total hip replacement in 2 days.  We will reassess her peripheral vasculature in 6 months after she is rehabilitated.

## 2020-11-19 NOTE — Progress Notes (Signed)
11/19/2020 [name]   May 04, 1933  [id number]  Primary Physician Jani Gravel, MD Primary Cardiologist: Lorretta Harp MD [name], [place]  HPI:  [name] is a 86 y.o.  married African American female patient of Dr. Shanon Brow Harding's and Dr. Leigh Aurora.I last saw her in the office  04/08/2020.Marland KitchenShe has known CAD status post coronary bypass grafting in the past (1998). She has preserved LV function and nonischemic Myoview. Because of claudication she underwent peripheral vascular angiography and other [name] 02/22/13 revealing significant popliteal and infrapopliteal disease. She has lifestyle limiting claudication.on 05/16/13 she underwent diamondback orbital rotational atherectomy, PTA of her distal right SFA and stenting using coronary drug-eluting stent for peroneal artery which was her only patent artery on the right. She does not notice any improvement and claudication since her procedure although she says her right leg his less "heavy" and that her left leg is much more symptomatic.I restudied her 07/20/13 with the intention of fixing her left leg however I decided to defer this because of anatomic complexity. Ultimately I referred her to Dr. Brunetta Jeans in [facility] who performed intervention on her left popliteal and tibioperoneal trunk on 10/01/13. Followup Dopplers performed 04/29/14 were excellent with ABIs of greater than 1 bilaterally, patent peroneal stent on the right a patent left popliteal. Subsequent Dopplers performed in December showed a right ABI of 0.81 and a left ABI 0.91 with patent stents  She had done well after intervention and actually said that she was able to ambulate with claudication. Her last Doppler studies performed 11/10/16 revealed normal ABIs bilaterally with patent SFAs and infrapopliteal vessels. She has noticed some superficial hypersensitivity on her left pretibial area that had venous Doppler studies performed recently that showed no evidence  of DVT.  Shedid have a right total hip replacement 11/19 which she is still recovering from. Recent lower extremity arterial Doppler studies performed 06/21/2019 revealed a decline in her right ABI0..76 with a high-frequency signal in her right popliteal artery and monophasic waveforms below that. She complains of pain in both legs from the knee down left greater than right at rest and with exertion despite normal left ABIs and a palpable pedal pulse on that side.   She apparently needs an elective left total knee replacement. She has difficulty ambulating mostly because of pain in her left leg. She also has chronic lower extremity edema left greater than right on low-dose furosemide which we are going to increase from 20 to 40 mg a day. She takes occasional sublingual nitroglycerin every other week for chest pain. She did have a nonischemic Myoview for preoperative clearance before her right total hip replacement 09/15/2018.  She was admitted to the hospital on 03/18/2020 with chest pain.  She ruled out for myocardial infarction.  A Myoview stress test was read as low risk with mild inferior ischemia.  She also had lower extremity arterial Doppler studies performed in our office 03/31/2020 which revealed a right ABI of 0.39 with an occluded right popliteal artery and a left of 1.12.  I cleared her for her left total hip replacement low risk given her recent Myoview stress test results.  She does not really complain of lifestyle limiting claudication on the right.  Unfortunately her left total hip replacement was delayed because of COVID-19 and is scheduled for 2 days from now.  She had a low risk Myoview stress test.  She had a right total knee replacement back in 2019.  She denies  chest pain or shortness of breath.   Current Meds  Medication Sig  . acetaminophen (TYLENOL) 500 MG tablet Take 1 tablet (500 mg total) by mouth daily as needed for moderate pain. LEG PAIN. PLEASE DISCUSS STARTING A  MEDICATION FOR PAIN MANAGEMENT WITH YOUR PCP. (Patient taking differently: Take 500 mg by mouth daily as needed for moderate pain. )  . acetaminophen-codeine (TYLENOL #3) 300-30 MG tablet Take 1-2 tablets by mouth every 8 (eight) hours as needed for moderate pain.  . carvedilol (COREG) 6.25 MG tablet Take 1 tablet (6.25 mg total) by mouth 2 (two) times daily.  . Cholecalciferol (VITAMIN D3) 10 MCG (400 UNIT) tablet Take 400 Units by mouth daily.  . clopidogrel (PLAVIX) 75 MG tablet Take 75 mg by mouth daily.  . Coenzyme Q10 (CO Q 10 PO) Take 1 tablet by mouth every other day.   . cyanocobalamin 1000 MCG tablet Take 1,000 mcg by mouth daily.  Marland Kitchen doxycycline (VIBRA-TABS) 100 MG tablet Take 100 mg by mouth 2 (two) times daily.  Marland Kitchen ezetimibe (ZETIA) 10 MG tablet Take 10 mg by mouth daily.  . folic acid (FOLVITE) 1 MG tablet Take 1 mg by mouth daily.  . furosemide (LASIX) 40 MG tablet Take 40 Mg (1 tablet) daily (Patient taking differently: Take 20 mg by mouth daily. )  . gabapentin (NEURONTIN) 300 MG capsule TAKE 1 CAPSULE BY MOUTH THREE TIMES A DAY  . gabapentin (NEURONTIN) 400 MG capsule Take 400 mg by mouth 3 (three) times daily.  Marland Kitchen glimepiride (AMARYL) 1 MG tablet Take 3 mg by mouth daily with breakfast.   . isosorbide mononitrate (IMDUR) 60 MG 24 hr tablet TAKE 1.5 TABLETS (90 MG TOTAL) BY MOUTH DAILY. . (Patient taking differently: Take 60 mg by mouth daily. Marland Kitchen)  . LORazepam (ATIVAN) 0.5 MG tablet Take 0.5 mg by mouth every 6 (six) hours as needed for anxiety or sleep.   . methocarbamol (ROBAXIN) 500 MG tablet Take 1 tablet (500 mg total) by mouth every 8 (eight) hours as needed for muscle spasms.  . methotrexate (RHEUMATREX) 2.5 MG tablet Take 20 mg by mouth once a week. Every Monday of each week  . nitroGLYCERIN (NITROSTAT) 0.4 MG SL tablet PLACE 1 TABLET (0.4 MG TOTAL) UNDER THE TONGUE EVERY 5 (FIVE) MINUTES AS NEEDED FOR CHEST PAIN.  Glory Rosebush Delica Lancets 40C MISC   . pantoprazole  (PROTONIX) 40 MG tablet Take 1 tablet (40 mg total) by mouth 2 (two) times daily before a meal.  . predniSONE (DELTASONE) 5 MG tablet Take 5 mg by mouth daily with breakfast.   . rosuvastatin (CRESTOR) 40 MG tablet Take 1 tablet (40 mg total) by mouth at bedtime. (Patient taking differently: Take 20 mg by mouth daily. )  . sulfaSALAzine (AZULFIDINE) 500 MG EC tablet Take 500 mg by mouth 2 (two) times daily.   . traMADol (ULTRAM) 50 MG tablet Take 1-2 tablets (50-100 mg total) by mouth every 6 (six) hours as needed.     Allergies  Allergen Reactions  . Fish Allergy Hives and Shortness Of Breath  . Ivp Dye [Iodinated Diagnostic Agents] Shortness Of Breath and Anaphylaxis  . Latex Anaphylaxis, Hives, Shortness Of Breath, Itching and Other (See Comments)    REACTION: wheezing  . Shellfish Allergy Anaphylaxis, Hives, Shortness Of Breath and Other (See Comments)    All seafood  . Shellfish-Derived Products Anaphylaxis and Hives  . Other Itching and Other (See Comments)    Patient reports allergy to perfumed  detergents. Causes itching.  . Metformin Nausea Only and Nausea And Vomiting    Social History   Socioeconomic History  . Marital status: Married    Spouse name: Not on file  . Number of children: Not on file  . Years of education: Not on file  . Highest education level: Not on file  Occupational History  . Occupation: retired  Tobacco Use  . Smoking status: Never Smoker  . Smokeless tobacco: Never Used  Vaping Use  . Vaping Use: Never used  Substance and Sexual Activity  . Alcohol use: Not Currently    Comment: rare glass of wine   . Drug use: No  . Sexual activity: Not on file  Other Topics Concern  . Not on file  Social History Narrative    She is a married mother of 74, grandmother of 12, great grandmother of 1.    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    She does not really get a lot of exercise,    She does not smoke or drink EtOH.   Social  Determinants of Health   Financial Resource Strain:   . Difficulty of Paying Living Expenses: Not on file  Food Insecurity:   . Worried About Charity fundraiser in the Last Year: Not on file  . Ran Out of Food in the Last Year: Not on file  Transportation Needs:   . Lack of Transportation (Medical): Not on file  . Lack of Transportation (Non-Medical): Not on file  Physical Activity:   . Days of Exercise per Week: Not on file  . Minutes of Exercise per Session: Not on file  Stress:   . Feeling of Stress : Not on file  Social Connections:   . Frequency of Communication with Friends and Family: Not on file  . Frequency of Social Gatherings with Friends and Family: Not on file  . Attends Religious Services: Not on file  . Active Member of Clubs or Organizations: Not on file  . Attends Archivist Meetings: Not on file  . Marital Status: Not on file  Intimate Partner Violence:   . Fear of Current or Ex-Partner: Not on file  . Emotionally Abused: Not on file  . Physically Abused: Not on file  . Sexually Abused: Not on file     Review of Systems: General: negative for chills, fever, night sweats or weight changes.  Cardiovascular: negative for chest pain, dyspnea on exertion, edema, orthopnea, palpitations, paroxysmal nocturnal dyspnea or shortness of breath Dermatological: negative for rash Respiratory: negative for cough or wheezing Urologic: negative for hematuria Abdominal: negative for nausea, vomiting, diarrhea, bright red blood per rectum, melena, or hematemesis Neurologic: negative for visual changes, syncope, or dizziness All other systems reviewed and are otherwise negative except as noted above.    Blood pressure (!) 146/74, pulse 68, height 5' 2.5" (1.588 m), weight 149 lb (67.6 kg).  General appearance: alert and no distress Neck: no adenopathy, no JVD, supple, symmetrical, trachea midline, thyroid not enlarged, symmetric, no tenderness/mass/nodules and Left  carotid bruit Lungs: clear to auscultation bilaterally Heart: regular rate and rhythm, S1, S2 normal, no murmur, click, rub or gallop Extremities: extremities normal, atraumatic, no cyanosis or edema Pulses: 2+ and symmetric Diminished pedal pulses Skin: Skin color, texture, turgor normal. No rashes or lesions Neurologic: Alert and oriented X 3, normal strength and tone. Normal symmetric reflexes. Normal coordination and gait  EKG not performed today  ASSESSMENT AND  PLAN:   PAD,severe calcified pop and tibial peroneal trunk disease bilateraly Ms. Bangerter returns today for follow-up of PAD.  I performed peripheral intervention on her 05/15/2013 of her right above-the-knee popliteal artery as well as right peroneal artery.  She only had one-vessel runoff.  I later sent her to [facility] and Dr. Brunetta Jeans performed complex left popliteal and tibioperoneal trunk revascularization 10/01/2013 with improvement in her left ABI.  Her most recent Dopplers performed 03/31/2020 revealed a right ABI of 0.39 and a left 1.12.  She had an occluded right popliteal artery as well as anterior tibial artery.  Her left popliteal stent appears patent although she did have a high-frequency signal in her distal left common femoral artery.  She is scheduled for left total hip replacement in 2 days.  We will reassess her peripheral vasculature in 6 months after she is rehabilitated.      Lorretta Harp MD FACP,FACC,FAHA, [facility] 11/19/2020 4:25 PM

## 2020-11-20 ENCOUNTER — Telehealth: Payer: Self-pay | Admitting: *Deleted

## 2020-11-20 LAB — URINE CULTURE: Culture: 20000 — AB

## 2020-11-20 NOTE — Telephone Encounter (Signed)
Ortho bundle pre-op call completed. 

## 2020-11-20 NOTE — Care Plan (Signed)
RNCM call to patient to discuss her Left total hip arthroplasty with Dr. Ninfa Linden. She is an Ortho bundle patient through [facility] and is agreeable to case management. She has a daughter that she feels would be willing to help her, but states she doesn't feel she'd be available to go home. She would like to go to SNF rehab after hospitalization for a few days in order to go home, where she lives alone. She has a walker and cane she states. She has requested [facility] for SNF. CM will make TOC team aware once surgery is completed. Updated Dr. Ninfa Linden on patient's discharge plan. Will continue to follow for needs.

## 2020-11-20 NOTE — H&P (Signed)
TOTAL HIP ADMISSION H&P  Patient is admitted for left total hip arthroplasty.  Subjective:  Chief Complaint: left hip pain  HPI: [name], 86 y.o. female, has a history of pain and functional disability in the left hip(s) due to arthritis and patient has failed non-surgical conservative treatments for greater than 12 weeks to include NSAID's and/or analgesics, corticosteriod injections, flexibility and strengthening excercises, supervised PT with diminished ADL's post treatment, use of assistive devices and activity modification.  Onset of symptoms was gradual starting 2 years ago with gradually worsening course since that time.The patient noted no past surgery on the left hip(s).  Patient currently rates pain in the left hip at 10 out of 10 with activity. Patient has night pain, worsening of pain with activity and weight bearing, trendelenberg gait, pain that interfers with activities of daily living and pain with passive range of motion. Patient has evidence of subchondral cysts, subchondral sclerosis, periarticular osteophytes and joint space narrowing by imaging studies. This condition presents safety issues increasing the risk of falls.  There is no current active infection.  Patient Active Problem List   Diagnosis Date Noted  . Near syncope 07/10/2020  . Hx of CABG 07/10/2020  . Preoperative cardiovascular examination 04/15/2020  . Chest pain 03/18/2020  . Unilateral primary osteoarthritis, left hip 04/03/2019  . History of right hip replacement 04/03/2019  . Status post total replacement of right hip 10/12/2018  . Unilateral primary osteoarthritis, right hip 09/12/2018  . Degenerative joint disease 05/30/2018  . Anal fissure 11/10/2017  . Rectal bleeding 11/10/2017  . Rectal pain 11/10/2017  . Inflammatory arthritis 10/13/2017  . Latent tuberculosis by blood test 10/13/2017  . Chronic diastolic CHF (congestive heart failure), NYHA class 2 ([place]) 08/11/2017  . Fatigue due to  treatment 01/09/2017  . NSTEMI (non-ST elevated myocardial infarction) ([place]) 11/01/2016  . Diabetes mellitus ([place]) 10/24/2015  . Non-insulin dependent type 2 diabetes mellitus ([place]) 10/24/2015  . Angina, class I ([place]) 04/22/2015  . DOE (dyspnea on exertion) 04/22/2015  . Bilateral leg edema 04/22/2015  . Accumulation of fluid in tissues 03/21/2014  . Vertigo, central 01/02/2014  . Vertigo 01/02/2014  . Limb pain- echymosis, pain Lt radial artery after cath 12/11/2013  . Dyslipidemia, goal LDL below 70 05/17/2013  . Essential hypertension 05/17/2013  . Claudication ([place]) 05/16/2013  . PTA/ Stent Rt popliteal and Rt peroneal artery 05/16/13 05/16/2013  . PAD,severe calcified pop and tibial peroneal trunk disease bilateraly   . ABDOMINAL PAIN RIGHT LOWER QUADRANT 10/07/2010  . CVA-STROKE 03/04/2010  . BARRETT'S ESOPHAGUS 03/04/2010  . FECAL INCONTINENCE 03/04/2010  . DM 01/14/2010  . Anemia of chronic disease 01/14/2010  . Atherosclerotic heart disease of native coronary artery with angina pectoris ([place]) 01/14/2010  . DIVERTICULOSIS OF COLON 01/14/2010  . PERSONAL HISTORY OF COLONIC POLYPS 01/14/2010  . [place] ALLERGY 01/14/2010   Past Medical History:  Diagnosis Date  . Allergy to IVP dye   . Angina, class I ([place]) 04/22/2015  . Atherosclerotic heart disease native coronary artery w/angina pectoris ([place]) 1992   a. 1992 s/p PTCA of LAD;  b. 1998 CABG x 3; c. 07/2001 Cath: Sev LM/LAD/LCX dzs, 3/3 patent grafts; d. 11/2013 Cath: stable graft anatomy; e. 10/2016 Cath: native 3VD, VG->OM nl, LIMA->LAD nl, VG->Diag 55.  Marland Kitchen Atherosclerotic heart disease of native coronary artery with angina pectoris ([place]) 01/14/2010   Qualifier: Diagnosis of  By: Deatra Ina MD, Sandy Salaam   . Atrial septal aneurysm   . Barrett's esophagus 03/04/2010  Qualifier: Diagnosis of  By: Shane Crutch, Amy S   . Bilateral leg edema 04/22/2015  . Bradycardia, mild briefly to the 40s, asymptomatic 05/16/2013  . Carotid  arterial disease ([place])    a. 05/2014 Carotid U/S: No signif bilat ICA stenosis, >60% L ECA.  Marland Kitchen Chronic anemia   . Chronic diastolic CHF (congestive heart failure) ([place])    a. 10/2016 Echo: Ef 55-60%, no rwma, Gr1 DD, triv AI, PASP 73mmHg, atrial septal aneurysm.  . Chronic diastolic CHF (congestive heart failure), NYHA class 2 ([place]) 08/11/2017  . Claudication ([place]) 05/16/2013   Peripheral arterial occlusive disease with lifestyle limiting claudication   . Degenerative joint disease 05/30/2018  . DIVERTICULOSIS OF COLON 01/14/2010   Qualifier: Diagnosis of  By: Deatra Ina MD, Sandy Salaam   . DM (diabetes mellitus), type 2 with peripheral vascular complications ([place])    On Oral medications.  . DOE (dyspnea on exertion) 04/22/2015  . Essential hypertension   . FECAL INCONTINENCE 03/04/2010   Qualifier: Diagnosis of  By: Trellis Paganini PA-c, Amy S   . GERD (gastroesophageal reflux disease)   . Hyperlipidemia with target LDL less than 70   . Inflammatory arthritis 10/13/2017  . Non-insulin dependent type 2 diabetes mellitus ([place]) 10/24/2015  . NSTEMI (non-ST elevated myocardial infarction) ([place]) 11/01/2016  . PAD (peripheral artery disease) ([place]) 05/2013; 09/2013   a. severe calcified POP-1 & TP Trunk Dz bilat;  b . 05/2013 Diamondback Rot Athrectomy (R POP) --> PTA R Pop, DES to R Peroneal;  c. 09/2013 PTA/Stenting of L Pop [name] - retrograde access);  d. 04/2014 ABI's: R/L: 1.1/1.1.  Marland Kitchen Preoperative cardiovascular examination 04/15/2020  . PUD (peptic ulcer disease)   . S/P CABG x 3 1998   LIMA-LAD, SVG-OM, SVG-D1  . Severe claudication ([place]) 05/2013   Referred for Peripheral Angio (Dr. Gwenlyn Found --> Dr. Brunetta Jeans in [place])  . [place] ALLERGY 01/14/2010   Qualifier: Diagnosis of  By: Nelson-Smith CMA (AAMA), [name]    . Status post total replacement of right hip 10/12/2018  . Vertigo 01/02/2014    Past Surgical History:  Procedure Laterality Date  . ATHERECTOMY Right 05/15/2013   Procedure: ATHERECTOMY;   Surgeon: Lorretta Harp, MD;  Location: [facility] CATH LAB;  Service: Cardiovascular;  Laterality: Right;  popliteal  . BACK SURGERY    . CARDIAC CATHETERIZATION  07/17/01   2 v CAD with LM, circ, LAD, obtuse diag, mod stenosis of diag vein graft , mild stenosis of marg vein graft, nl EF, medical treatment  . CARDIAC CATHETERIZATION  11/2013   Widely patent LIMA-LAD & SVG-OM with mild progression of proximal stenosis ~40-50% in SVG-D1; Widely patent native RCA with known severe native LCA disease  . CARDIAC CATHETERIZATION N/A 11/01/2016   Procedure: Left Heart Cath and Cors/Grafts Angiography;  Surgeon: [name], MD;  Location: [facility] CV LAB;  Service: Cardiovascular: Hemodynamics: High LVEDP!.  Cors/Grafts: LM 55%, o-p LAD 100% then after D1 -> LIMA-LAD patent, SVG-Diag ~55%. small RI wiht ~70% ostial. O-p Cx 80% - pCx 70% --> SVG-bifurcating OM patent. -- med Rx  . CORONARY ANGIOPLASTY  1992   PCI to LAD  . CORONARY ARTERY BYPASS GRAFT  1998   LIMA-LAD, SVG-diagonal (none roughly 50% stenosis), SVG-OM  . LEFT HEART CATHETERIZATION WITH CORONARY ANGIOGRAM N/A 11/27/2013   Procedure: LEFT HEART CATHETERIZATION WITH CORONARY ANGIOGRAM;  Surgeon: [name], MD;  Location: [facility] CATH LAB;  Service: Cardiovascular;  Laterality: N/A;  . LOW EXTREMITY DOPPLERS/ABI  10/2016   Patent right SFA, popliteal and peroneal tendons. Patent left SFA and popliteal stent. 30-50% stenosis in the right common femoral and popliteal arteries, 50-74% stenosis in left profunda femoris artery. --> 1 year follow-up.  RABI - 1.2, LABI 1.1  . LOWER EXTREMITY ANGIOGRAM N/A 02/22/2013   Procedure: LOWER EXTREMITY ANGIOGRAM;  Surgeon: Lorretta Harp, MD;  Location: [facility] CATH LAB;  Service: Cardiovascular;  Laterality: N/A;  . LOWER EXTREMITY ANGIOGRAM N/A 07/20/2013   Procedure: LOWER EXTREMITY ANGIOGRAM;  Surgeon: Lorretta Harp, MD;  Location: [facility] CATH LAB;  Service: Cardiovascular;  Laterality: N/A;  . NM MYOVIEW LTD   04/03/2013; 5/26'/2016   Lexiscan: a)  Apical perfusion defect with no ischemia. Consider prior infarct versus breast attenuation.;; b) 5/'16: LOW RISK, Nl EF ~55-65%, No Ischemia /Infarction  . NM MYOVIEW LTD  10/'19; 4/'21   a) LOW RISK.  EF 55%.  No ischemia or infarction. b) EF estimated 81%.  Suboptimal images.  May be subtle inferior ischemia-initially read by radiology as intermediate risk, however overread by cardiology to be mild low risk..  . PERCUTANEOUS STENT INTERVENTION Right 05/15/2013   Procedure: PERCUTANEOUS STENT INTERVENTION;  Surgeon: Lorretta Harp, MD;  Location: [facility] CATH LAB;  Service: Cardiovascular;  Laterality: Right;  tibioperoneal trunk  . Peroneal Artery Stent  05/15/13   Diamondback rot. atherectomy of high grade segmental popliteal stenosis then Chocolate balloonPTA; then angiosculpt PTA of the prox peroneal with stent-Xpedition   . pv angio  07/20/13   high-grade calcified popliteal disease with one-vessel runoff via a small anterior tibial artery that has proximal disease as well, no intervention  . TOTAL HIP ARTHROPLASTY Right 10/12/2018   Procedure: RIGHT TOTAL HIP ARTHROPLASTY ANTERIOR APPROACH;  Surgeon: Mcarthur Rossetti, MD;  Location: [facility] ORS;  Service: Orthopedics;  Laterality: Right;  . TRANSTHORACIC ECHOCARDIOGRAM  11/'17; 10/19   a) EF 55-60%. Gr 1 DD. Normal PAP. Aortic Sclerosis.;; b) Normal LV size and function with vigorous EF 65 to 70%.  GR 1 DD.  Severe LA dilation (suggestive of probably worsening: GR 1 DD).  . TRANSTHORACIC ECHOCARDIOGRAM  03/18/2020   EF 70-75%.  Hyperdynamic.  GR 1 DD.  No R WMA.  Mild LA dilation.  Mild aortic valve sclerosis but no stenosis.-No change from prior echo  . TUBAL LIGATION      No current facility-administered medications for this encounter.   Current Outpatient Medications  Medication Sig Dispense Refill Last Dose  . acetaminophen (TYLENOL) 500 MG tablet Take 1 tablet (500 mg total) by mouth daily as needed  for moderate pain. LEG PAIN. PLEASE DISCUSS STARTING A MEDICATION FOR PAIN MANAGEMENT WITH YOUR PCP. (Patient taking differently: Take 500 mg by mouth daily as needed for moderate pain. ) 30 tablet 0   . acetaminophen-codeine (TYLENOL #3) 300-30 MG tablet Take 1-2 tablets by mouth every 8 (eight) hours as needed for moderate pain. 30 tablet 0   . carvedilol (COREG) 6.25 MG tablet Take 1 tablet (6.25 mg total) by mouth 2 (two) times daily. 180 tablet 3   . Cholecalciferol (VITAMIN D3) 10 MCG (400 UNIT) tablet Take 400 Units by mouth daily.     . clopidogrel (PLAVIX) 75 MG tablet Take 75 mg by mouth daily.     . Coenzyme Q10 (CO Q 10 PO) Take 1 tablet by mouth every other day.      . cyanocobalamin 1000 MCG tablet Take 1,000 mcg by mouth daily.     Marland Kitchen doxycycline (VIBRA-TABS)  100 MG tablet Take 100 mg by mouth 2 (two) times daily.     Marland Kitchen ezetimibe (ZETIA) 10 MG tablet Take 10 mg by mouth daily.     . folic acid (FOLVITE) 1 MG tablet Take 1 mg by mouth daily.  1   . furosemide (LASIX) 40 MG tablet Take 40 Mg (1 tablet) daily (Patient taking differently: Take 20 mg by mouth daily. ) 90 tablet 1   . gabapentin (NEURONTIN) 300 MG capsule TAKE 1 CAPSULE BY MOUTH THREE TIMES A DAY 30 capsule 1   . gabapentin (NEURONTIN) 400 MG capsule Take 400 mg by mouth 3 (three) times daily.     Marland Kitchen glimepiride (AMARYL) 1 MG tablet Take 3 mg by mouth daily with breakfast.   12   . isosorbide mononitrate (IMDUR) 60 MG 24 hr tablet TAKE 1.5 TABLETS (90 MG TOTAL) BY MOUTH DAILY. . (Patient taking differently: Take 60 mg by mouth daily. Marland Kitchen) 135 tablet 3   . LORazepam (ATIVAN) 0.5 MG tablet Take 0.5 mg by mouth every 6 (six) hours as needed for anxiety or sleep.   5   . methocarbamol (ROBAXIN) 500 MG tablet Take 1 tablet (500 mg total) by mouth every 8 (eight) hours as needed for muscle spasms. 30 tablet 0   . methotrexate (RHEUMATREX) 2.5 MG tablet Take 20 mg by mouth once a week. Every Monday of each week  1   . nitroGLYCERIN  (NITROSTAT) 0.4 MG SL tablet PLACE 1 TABLET (0.4 MG TOTAL) UNDER THE TONGUE EVERY 5 (FIVE) MINUTES AS NEEDED FOR CHEST PAIN. 25 tablet 6   . OneTouch Delica Lancets 45W MISC      . pantoprazole (PROTONIX) 40 MG tablet Take 1 tablet (40 mg total) by mouth 2 (two) times daily before a meal. 60 tablet 1   . predniSONE (DELTASONE) 5 MG tablet Take 5 mg by mouth daily with breakfast.      . rosuvastatin (CRESTOR) 40 MG tablet Take 1 tablet (40 mg total) by mouth at bedtime. (Patient taking differently: Take 20 mg by mouth daily. ) 90 tablet 2   . sulfaSALAzine (AZULFIDINE) 500 MG EC tablet Take 500 mg by mouth 2 (two) times daily.   3   . traMADol (ULTRAM) 50 MG tablet Take 1-2 tablets (50-100 mg total) by mouth every 6 (six) hours as needed. 30 tablet 0    Allergies  Allergen Reactions  . Fish Allergy Hives and Shortness Of Breath  . Ivp Dye [Iodinated Diagnostic Agents] Shortness Of Breath and Anaphylaxis  . Latex Anaphylaxis, Hives, Shortness Of Breath, Itching and Other (See Comments)    REACTION: wheezing  . Shellfish Allergy Anaphylaxis, Hives, Shortness Of Breath and Other (See Comments)    All seafood  . Shellfish-Derived Products Anaphylaxis and Hives  . Other Itching and Other (See Comments)    Patient reports allergy to perfumed detergents. Causes itching.  . Metformin Nausea Only and Nausea And Vomiting    Social History   Tobacco Use  . Smoking status: Never Smoker  . Smokeless tobacco: Never Used  Substance Use Topics  . Alcohol use: Not Currently    Comment: rare glass of wine     Family History  Problem Relation Age of Onset  . Hypertension Mother   . Hyperlipidemia Mother   . Hyperlipidemia Father   . Hypertension Father      Review of Systems  Musculoskeletal: Positive for gait problem.  All other systems reviewed and are negative.  Objective:  Physical Exam Vitals reviewed.  Constitutional:      Appearance: Normal appearance.  HENT:     Head:  Normocephalic and atraumatic.  Eyes:     Extraocular Movements: Extraocular movements intact.     Pupils: Pupils are equal, round, and reactive to light.  Cardiovascular:     Rate and Rhythm: Normal rate.     Pulses: Normal pulses.  Pulmonary:     Effort: Pulmonary effort is normal.  Abdominal:     Palpations: Abdomen is soft.  Musculoskeletal:     Cervical back: Normal range of motion.     Left hip: Tenderness and bony tenderness present. Decreased range of motion. Decreased strength.  Neurological:     Mental Status: She is alert and oriented to person, place, and time.  Psychiatric:        Behavior: Behavior normal.     Vital signs in last 24 hours:    Labs:   Estimated body mass index is 26.82 kg/m as calculated from the following:   Height as of 11/19/20: 5' 2.5" (1.588 m).   Weight as of 11/19/20: 67.6 kg.   Imaging Review Plain radiographs demonstrate severe degenerative joint disease of the left hip(s). The bone quality appears to be good for age and reported activity level.      Assessment/Plan:  End stage arthritis, left hip(s)  The patient history, physical examination, clinical judgement of the provider and imaging studies are consistent with end stage degenerative joint disease of the left hip(s) and total hip arthroplasty is deemed medically necessary. The treatment options including medical management, injection therapy, arthroscopy and arthroplasty were discussed at length. The risks and benefits of total hip arthroplasty were presented and reviewed. The risks due to aseptic loosening, infection, stiffness, dislocation/subluxation,  thromboembolic complications and other imponderables were discussed.  The patient acknowledged the explanation, agreed to proceed with the plan and consent was signed. Patient is being admitted for inpatient treatment for surgery, pain control, PT, OT, prophylactic antibiotics, VTE prophylaxis, progressive ambulation and ADL's and  discharge planning.The patient is planning to be discharged to skilled nursing facility

## 2020-11-21 ENCOUNTER — Encounter (HOSPITAL_COMMUNITY)
Admission: RE | Disposition: A | Discharge status: Skilled Nursing Facility | Payer: Self-pay | Source: Home / Self Care | Attending: Orthopaedic Surgery

## 2020-11-21 ENCOUNTER — Ambulatory Visit (HOSPITAL_COMMUNITY): Payer: Medicare Other

## 2020-11-21 ENCOUNTER — Ambulatory Visit (HOSPITAL_COMMUNITY): Payer: Medicare Other | Admitting: Physician Assistant

## 2020-11-21 ENCOUNTER — Inpatient Hospital Stay (HOSPITAL_COMMUNITY)
Admission: RE | Admit: 2020-11-21 | Discharge: 2020-11-24 | DRG: 470 | Disposition: A | Discharge status: Skilled Nursing Facility | Payer: Medicare Other | Attending: Orthopaedic Surgery | Admitting: Orthopaedic Surgery

## 2020-11-21 ENCOUNTER — Observation Stay (HOSPITAL_COMMUNITY): Payer: Medicare Other

## 2020-11-21 ENCOUNTER — Encounter (HOSPITAL_COMMUNITY): Payer: Self-pay | Admitting: Orthopaedic Surgery

## 2020-11-21 ENCOUNTER — Telehealth (HOSPITAL_COMMUNITY): Payer: Self-pay | Admitting: *Deleted

## 2020-11-21 ENCOUNTER — Other Ambulatory Visit: Payer: Self-pay

## 2020-11-21 ENCOUNTER — Telehealth: Payer: Self-pay

## 2020-11-21 ENCOUNTER — Ambulatory Visit (HOSPITAL_COMMUNITY): Payer: Medicare Other | Admitting: Certified Registered Nurse Anesthetist

## 2020-11-21 DIAGNOSIS — Z471 Aftercare following joint replacement surgery: Secondary | ICD-10-CM | POA: Diagnosis not present

## 2020-11-21 DIAGNOSIS — Z951 Presence of aortocoronary bypass graft: Secondary | ICD-10-CM

## 2020-11-21 DIAGNOSIS — I5032 Chronic diastolic (congestive) heart failure: Secondary | ICD-10-CM | POA: Diagnosis not present

## 2020-11-21 DIAGNOSIS — Z91013 Allergy to seafood: Secondary | ICD-10-CM

## 2020-11-21 DIAGNOSIS — M25452 Effusion, left hip: Secondary | ICD-10-CM | POA: Diagnosis present

## 2020-11-21 DIAGNOSIS — M1611 Unilateral primary osteoarthritis, right hip: Secondary | ICD-10-CM | POA: Diagnosis not present

## 2020-11-21 DIAGNOSIS — E1151 Type 2 diabetes mellitus with diabetic peripheral angiopathy without gangrene: Secondary | ICD-10-CM | POA: Diagnosis not present

## 2020-11-21 DIAGNOSIS — M1612 Unilateral primary osteoarthritis, left hip: Secondary | ICD-10-CM | POA: Diagnosis not present

## 2020-11-21 DIAGNOSIS — I2510 Atherosclerotic heart disease of native coronary artery without angina pectoris: Secondary | ICD-10-CM | POA: Diagnosis not present

## 2020-11-21 DIAGNOSIS — I214 Non-ST elevation (NSTEMI) myocardial infarction: Secondary | ICD-10-CM | POA: Diagnosis not present

## 2020-11-21 DIAGNOSIS — Z7902 Long term (current) use of antithrombotics/antiplatelets: Secondary | ICD-10-CM

## 2020-11-21 DIAGNOSIS — Z79899 Other long term (current) drug therapy: Secondary | ICD-10-CM

## 2020-11-21 DIAGNOSIS — Z96642 Presence of left artificial hip joint: Secondary | ICD-10-CM

## 2020-11-21 DIAGNOSIS — Z7984 Long term (current) use of oral hypoglycemic drugs: Secondary | ICD-10-CM

## 2020-11-21 DIAGNOSIS — K219 Gastro-esophageal reflux disease without esophagitis: Secondary | ICD-10-CM | POA: Diagnosis present

## 2020-11-21 DIAGNOSIS — Z8615 Personal history of latent tuberculosis infection: Secondary | ICD-10-CM

## 2020-11-21 DIAGNOSIS — E785 Hyperlipidemia, unspecified: Secondary | ICD-10-CM | POA: Diagnosis present

## 2020-11-21 DIAGNOSIS — Z83438 Family history of other disorder of lipoprotein metabolism and other lipidemia: Secondary | ICD-10-CM

## 2020-11-21 DIAGNOSIS — Z419 Encounter for procedure for purposes other than remedying health state, unspecified: Secondary | ICD-10-CM

## 2020-11-21 DIAGNOSIS — Z91041 Radiographic dye allergy status: Secondary | ICD-10-CM

## 2020-11-21 DIAGNOSIS — Z8719 Personal history of other diseases of the digestive system: Secondary | ICD-10-CM

## 2020-11-21 DIAGNOSIS — I70201 Unspecified atherosclerosis of native arteries of extremities, right leg: Secondary | ICD-10-CM | POA: Diagnosis not present

## 2020-11-21 DIAGNOSIS — Z20822 Contact with and (suspected) exposure to covid-19: Secondary | ICD-10-CM | POA: Diagnosis present

## 2020-11-21 DIAGNOSIS — Z888 Allergy status to other drugs, medicaments and biological substances status: Secondary | ICD-10-CM

## 2020-11-21 DIAGNOSIS — I252 Old myocardial infarction: Secondary | ICD-10-CM

## 2020-11-21 DIAGNOSIS — Z96641 Presence of right artificial hip joint: Secondary | ICD-10-CM | POA: Diagnosis present

## 2020-11-21 DIAGNOSIS — Z95820 Peripheral vascular angioplasty status with implants and grafts: Secondary | ICD-10-CM

## 2020-11-21 DIAGNOSIS — Z8711 Personal history of peptic ulcer disease: Secondary | ICD-10-CM

## 2020-11-21 DIAGNOSIS — Z91040 Latex allergy status: Secondary | ICD-10-CM

## 2020-11-21 DIAGNOSIS — Z8673 Personal history of transient ischemic attack (TIA), and cerebral infarction without residual deficits: Secondary | ICD-10-CM

## 2020-11-21 DIAGNOSIS — Z9861 Coronary angioplasty status: Secondary | ICD-10-CM

## 2020-11-21 DIAGNOSIS — I110 Hypertensive heart disease with heart failure: Secondary | ICD-10-CM | POA: Diagnosis not present

## 2020-11-21 DIAGNOSIS — Z8249 Family history of ischemic heart disease and other diseases of the circulatory system: Secondary | ICD-10-CM

## 2020-11-21 HISTORY — PX: TOTAL HIP ARTHROPLASTY: SHX124

## 2020-11-21 LAB — TYPE AND SCREEN
ABO/RH(D): A POS
Antibody Screen: NEGATIVE

## 2020-11-21 LAB — GLUCOSE, CAPILLARY
Glucose-Capillary: 118 mg/dL — ABNORMAL HIGH (ref 70–99)
Glucose-Capillary: 134 mg/dL — ABNORMAL HIGH (ref 70–99)

## 2020-11-21 SURGERY — ARTHROPLASTY, HIP, TOTAL, ANTERIOR APPROACH
Anesthesia: Monitor Anesthesia Care | Site: Hip | Laterality: Left

## 2020-11-21 MED ORDER — ONDANSETRON HCL 4 MG/2ML IJ SOLN
INTRAMUSCULAR | Status: AC
  Filled 2020-11-21: qty 2

## 2020-11-21 MED ORDER — DEXAMETHASONE SODIUM PHOSPHATE 10 MG/ML IJ SOLN
INTRAMUSCULAR | Status: AC
  Filled 2020-11-21: qty 1

## 2020-11-21 MED ORDER — CLOPIDOGREL BISULFATE 75 MG PO TABS
75.0000 mg | ORAL_TABLET | Freq: Every day | ORAL | Status: DC
  Administered 2020-11-22 – 2020-11-24 (×3): 75 mg via ORAL
  Filled 2020-11-21 (×3): qty 1

## 2020-11-21 MED ORDER — MORPHINE SULFATE (PF) 2 MG/ML IV SOLN
0.5000 mg | INTRAVENOUS | Status: DC | PRN
Start: 2020-11-21 — End: 2020-11-24
  Administered 2020-11-21 (×2): 1 mg via INTRAVENOUS
  Filled 2020-11-21 (×2): qty 1

## 2020-11-21 MED ORDER — ALUM & MAG HYDROXIDE-SIMETH 200-200-20 MG/5ML PO SUSP: 30.0000 mL | ORAL | Status: DC | PRN

## 2020-11-21 MED ORDER — FUROSEMIDE 40 MG PO TABS
40.0000 mg | ORAL_TABLET | Freq: Every day | ORAL | Status: DC
  Administered 2020-11-24: 40 mg via ORAL
  Filled 2020-11-21 (×3): qty 1

## 2020-11-21 MED ORDER — LIDOCAINE HCL (CARDIAC) PF 100 MG/5ML IV SOSY
PREFILLED_SYRINGE | INTRAVENOUS | Status: DC | PRN
  Administered 2020-11-21: 60 mg via INTRAVENOUS

## 2020-11-21 MED ORDER — PROPOFOL 500 MG/50ML IV EMUL
INTRAVENOUS | Status: DC | PRN
  Administered 2020-11-21: 100 ug/kg/min via INTRAVENOUS

## 2020-11-21 MED ORDER — LIDOCAINE HCL (PF) 2 % IJ SOLN
INTRAMUSCULAR | Status: AC
  Filled 2020-11-21: qty 5

## 2020-11-21 MED ORDER — GABAPENTIN 400 MG PO CAPS
400.0000 mg | ORAL_CAPSULE | Freq: Three times a day (TID) | ORAL | Status: DC
  Administered 2020-11-21 – 2020-11-24 (×9): 400 mg via ORAL
  Filled 2020-11-21 (×9): qty 1

## 2020-11-21 MED ORDER — METOCLOPRAMIDE HCL 5 MG PO TABS: 5.0000 mg | ORAL_TABLET | Freq: Three times a day (TID) | ORAL | Status: DC | PRN

## 2020-11-21 MED ORDER — BUPIVACAINE IN DEXTROSE 0.75-8.25 % IT SOLN
INTRATHECAL | Status: DC | PRN
  Administered 2020-11-21: 1.6 mL via INTRATHECAL

## 2020-11-21 MED ORDER — CHLORHEXIDINE GLUCONATE 0.12 % MT SOLN
15.0000 mL | Freq: Once | OROMUCOSAL | Status: AC
  Administered 2020-11-21: 15 mL via OROMUCOSAL

## 2020-11-21 MED ORDER — ROSUVASTATIN CALCIUM 20 MG PO TABS
40.0000 mg | ORAL_TABLET | Freq: Every day | ORAL | Status: DC
  Administered 2020-11-21 – 2020-11-23 (×3): 40 mg via ORAL
  Filled 2020-11-21 (×3): qty 2

## 2020-11-21 MED ORDER — CELECOXIB 200 MG PO CAPS
200.0000 mg | ORAL_CAPSULE | Freq: Once | ORAL | Status: AC
  Administered 2020-11-21: 200 mg via ORAL
  Filled 2020-11-21: qty 1

## 2020-11-21 MED ORDER — NITROGLYCERIN 0.4 MG SL SUBL: 0.4000 mg | SUBLINGUAL_TABLET | SUBLINGUAL | Status: DC | PRN

## 2020-11-21 MED ORDER — CARVEDILOL 6.25 MG PO TABS
6.2500 mg | ORAL_TABLET | Freq: Two times a day (BID) | ORAL | Status: DC
  Administered 2020-11-21 – 2020-11-24 (×6): 6.25 mg via ORAL
  Filled 2020-11-21 (×6): qty 1

## 2020-11-21 MED ORDER — PHENYLEPHRINE HCL-NACL 10-0.9 MG/250ML-% IV SOLN
INTRAVENOUS | Status: DC | PRN
  Administered 2020-11-21: 25 ug/min via INTRAVENOUS

## 2020-11-21 MED ORDER — CEFAZOLIN SODIUM-DEXTROSE 2-4 GM/100ML-% IV SOLN
2.0000 g | INTRAVENOUS | Status: AC
  Administered 2020-11-21: 2 g via INTRAVENOUS
  Filled 2020-11-21: qty 100

## 2020-11-21 MED ORDER — ASPIRIN 81 MG PO CHEW
81.0000 mg | CHEWABLE_TABLET | Freq: Every day | ORAL | Status: DC
  Administered 2020-11-22 – 2020-11-24 (×3): 81 mg via ORAL
  Filled 2020-11-21 (×3): qty 1

## 2020-11-21 MED ORDER — METHOCARBAMOL 1000 MG/10ML IJ SOLN
500.0000 mg | Freq: Four times a day (QID) | INTRAVENOUS | Status: DC | PRN
  Filled 2020-11-21: qty 5

## 2020-11-21 MED ORDER — CEFAZOLIN SODIUM-DEXTROSE 1-4 GM/50ML-% IV SOLN
1.0000 g | Freq: Four times a day (QID) | INTRAVENOUS | Status: AC
  Administered 2020-11-21 (×2): 1 g via INTRAVENOUS
  Filled 2020-11-21 (×2): qty 50

## 2020-11-21 MED ORDER — LORAZEPAM 0.5 MG PO TABS: 0.5000 mg | ORAL_TABLET | Freq: Four times a day (QID) | ORAL | Status: DC | PRN

## 2020-11-21 MED ORDER — PHENOL 1.4 % MT LIQD: 1.0000 | OROMUCOSAL | Status: DC | PRN

## 2020-11-21 MED ORDER — SODIUM CHLORIDE 0.9 % IR SOLN
Status: DC | PRN
  Administered 2020-11-21: 1000 mL

## 2020-11-21 MED ORDER — AMISULPRIDE (ANTIEMETIC) 5 MG/2ML IV SOLN
INTRAVENOUS | Status: AC
  Filled 2020-11-21: qty 4

## 2020-11-21 MED ORDER — ONDANSETRON HCL 4 MG/2ML IJ SOLN
INTRAMUSCULAR | Status: DC | PRN
  Administered 2020-11-21: 4 mg via INTRAVENOUS

## 2020-11-21 MED ORDER — PROPOFOL 10 MG/ML IV BOLUS
INTRAVENOUS | Status: DC | PRN
  Administered 2020-11-21: 60 mg via INTRAVENOUS

## 2020-11-21 MED ORDER — GLIMEPIRIDE 2 MG PO TABS
3.0000 mg | ORAL_TABLET | Freq: Every day | ORAL | Status: DC
  Administered 2020-11-22 – 2020-11-24 (×3): 3 mg via ORAL
  Filled 2020-11-21 (×3): qty 1

## 2020-11-21 MED ORDER — DIPHENHYDRAMINE HCL 12.5 MG/5ML PO ELIX: 12.5000 mg | ORAL_SOLUTION | ORAL | Status: DC | PRN

## 2020-11-21 MED ORDER — PROPOFOL 1000 MG/100ML IV EMUL
INTRAVENOUS | Status: AC
  Filled 2020-11-21: qty 100

## 2020-11-21 MED ORDER — SULFASALAZINE 500 MG PO TBEC
500.0000 mg | DELAYED_RELEASE_TABLET | Freq: Two times a day (BID) | ORAL | Status: DC
  Administered 2020-11-21 – 2020-11-24 (×6): 500 mg via ORAL
  Filled 2020-11-21 (×6): qty 1

## 2020-11-21 MED ORDER — HYDROCODONE-ACETAMINOPHEN 5-325 MG PO TABS
1.0000 | ORAL_TABLET | ORAL | Status: DC | PRN
  Administered 2020-11-21: 2 via ORAL
  Administered 2020-11-21: 1 via ORAL
  Administered 2020-11-21 – 2020-11-23 (×4): 2 via ORAL
  Administered 2020-11-24 (×2): 1 via ORAL
  Filled 2020-11-21: qty 1
  Filled 2020-11-21 (×4): qty 2
  Filled 2020-11-21 (×3): qty 1
  Filled 2020-11-21: qty 2

## 2020-11-21 MED ORDER — HYDROCODONE-ACETAMINOPHEN 7.5-325 MG PO TABS: 1.0000 | ORAL_TABLET | ORAL | Status: DC | PRN

## 2020-11-21 MED ORDER — ONDANSETRON HCL 4 MG PO TABS: 4.0000 mg | ORAL_TABLET | Freq: Four times a day (QID) | ORAL | Status: DC | PRN

## 2020-11-21 MED ORDER — PREDNISONE 5 MG PO TABS
5.0000 mg | ORAL_TABLET | Freq: Every day | ORAL | Status: DC
  Administered 2020-11-22 – 2020-11-24 (×3): 5 mg via ORAL
  Filled 2020-11-21 (×3): qty 1

## 2020-11-21 MED ORDER — PHENYLEPHRINE HCL (PRESSORS) 10 MG/ML IV SOLN
INTRAVENOUS | Status: AC
  Filled 2020-11-21: qty 1

## 2020-11-21 MED ORDER — SODIUM CHLORIDE 0.9 % IV SOLN: INTRAVENOUS | Status: DC

## 2020-11-21 MED ORDER — ISOSORBIDE MONONITRATE ER 60 MG PO TB24
90.0000 mg | ORAL_TABLET | Freq: Every day | ORAL | Status: DC
  Administered 2020-11-21 – 2020-11-24 (×3): 90 mg via ORAL
  Filled 2020-11-21 (×4): qty 1

## 2020-11-21 MED ORDER — ORAL CARE MOUTH RINSE: 15.0000 mL | Freq: Once | OROMUCOSAL | Status: AC

## 2020-11-21 MED ORDER — ACETAMINOPHEN 325 MG PO TABS: 325.0000 mg | ORAL_TABLET | Freq: Four times a day (QID) | ORAL | Status: DC | PRN

## 2020-11-21 MED ORDER — POVIDONE-IODINE 10 % EX SWAB
2.0000 "application " | Freq: Once | CUTANEOUS | Status: AC
  Administered 2020-11-21: 2 via TOPICAL

## 2020-11-21 MED ORDER — LACTATED RINGERS IV SOLN: INTRAVENOUS | Status: DC

## 2020-11-21 MED ORDER — 0.9 % SODIUM CHLORIDE (POUR BTL) OPTIME
TOPICAL | Status: DC | PRN
  Administered 2020-11-21: 1000 mL

## 2020-11-21 MED ORDER — ACETAMINOPHEN 500 MG PO TABS
1000.0000 mg | ORAL_TABLET | Freq: Once | ORAL | Status: AC
  Administered 2020-11-21: 1000 mg via ORAL
  Filled 2020-11-21: qty 2

## 2020-11-21 MED ORDER — METOCLOPRAMIDE HCL 5 MG/ML IJ SOLN: 5.0000 mg | Freq: Three times a day (TID) | INTRAMUSCULAR | Status: DC | PRN

## 2020-11-21 MED ORDER — EZETIMIBE 10 MG PO TABS
10.0000 mg | ORAL_TABLET | Freq: Every day | ORAL | Status: DC
  Administered 2020-11-21 – 2020-11-23 (×3): 10 mg via ORAL
  Filled 2020-11-21 (×3): qty 1

## 2020-11-21 MED ORDER — METHOCARBAMOL 500 MG PO TABS
500.0000 mg | ORAL_TABLET | Freq: Four times a day (QID) | ORAL | Status: DC | PRN
  Administered 2020-11-21 – 2020-11-23 (×3): 500 mg via ORAL
  Filled 2020-11-21 (×3): qty 1

## 2020-11-21 MED ORDER — MENTHOL 3 MG MT LOZG: 1.0000 | LOZENGE | OROMUCOSAL | Status: DC | PRN

## 2020-11-21 MED ORDER — AMISULPRIDE (ANTIEMETIC) 5 MG/2ML IV SOLN
10.0000 mg | Freq: Once | INTRAVENOUS | Status: AC
  Administered 2020-11-21: 10 mg via INTRAVENOUS

## 2020-11-21 MED ORDER — FENTANYL CITRATE (PF) 100 MCG/2ML IJ SOLN
25.0000 ug | INTRAMUSCULAR | Status: DC | PRN
Start: 2020-11-21 — End: 2020-11-21

## 2020-11-21 MED ORDER — DOCUSATE SODIUM 100 MG PO CAPS
100.0000 mg | ORAL_CAPSULE | Freq: Two times a day (BID) | ORAL | Status: DC
  Administered 2020-11-21 – 2020-11-24 (×6): 100 mg via ORAL
  Filled 2020-11-21 (×6): qty 1

## 2020-11-21 MED ORDER — LACTATED RINGERS IV SOLN: INTRAVENOUS | Status: DC | PRN

## 2020-11-21 MED ORDER — PANTOPRAZOLE SODIUM 40 MG PO TBEC
40.0000 mg | DELAYED_RELEASE_TABLET | Freq: Two times a day (BID) | ORAL | Status: DC
  Administered 2020-11-21 – 2020-11-24 (×6): 40 mg via ORAL
  Filled 2020-11-21 (×6): qty 1

## 2020-11-21 MED ORDER — FOLIC ACID 1 MG PO TABS
1.0000 mg | ORAL_TABLET | Freq: Every day | ORAL | Status: DC
  Administered 2020-11-22 – 2020-11-24 (×3): 1 mg via ORAL
  Filled 2020-11-21 (×3): qty 1

## 2020-11-21 MED ORDER — CHOLECALCIFEROL 10 MCG (400 UNIT) PO TABS
400.0000 [IU] | ORAL_TABLET | Freq: Every day | ORAL | Status: DC
  Administered 2020-11-22 – 2020-11-24 (×3): 400 [IU] via ORAL
  Filled 2020-11-21 (×3): qty 1

## 2020-11-21 MED ORDER — TRAMADOL HCL 50 MG PO TABS
50.0000 mg | ORAL_TABLET | Freq: Four times a day (QID) | ORAL | Status: DC
Start: 2020-11-21 — End: 2020-11-24
  Administered 2020-11-21 – 2020-11-24 (×6): 50 mg via ORAL
  Filled 2020-11-21 (×7): qty 1

## 2020-11-21 MED ORDER — ONDANSETRON HCL 4 MG/2ML IJ SOLN
4.0000 mg | Freq: Four times a day (QID) | INTRAMUSCULAR | Status: DC | PRN
  Administered 2020-11-22: 4 mg via INTRAVENOUS
  Filled 2020-11-21: qty 2

## 2020-11-21 MED ORDER — STERILE WATER FOR IRRIGATION IR SOLN
Status: DC | PRN
  Administered 2020-11-21: 1000 mL

## 2020-11-21 MED ORDER — TRANEXAMIC ACID-NACL 1000-0.7 MG/100ML-% IV SOLN
1000.0000 mg | INTRAVENOUS | Status: AC
  Administered 2020-11-21: 1000 mg via INTRAVENOUS
  Filled 2020-11-21: qty 100

## 2020-11-21 SURGICAL SUPPLY — 39 items
APL SKNCLS STERI-STRIP NONHPOA (GAUZE/BANDAGES/DRESSINGS)
BAG SPEC THK2 15X12 ZIP CLS (MISCELLANEOUS)
BAG ZIPLOCK 12X15 (MISCELLANEOUS) IMPLANT
BENZOIN TINCTURE PRP APPL 2/3 (GAUZE/BANDAGES/DRESSINGS) IMPLANT
BLADE SAW SGTL 18X1.27X75 (BLADE) ×2 IMPLANT
COVER PERINEAL POST (MISCELLANEOUS) ×2 IMPLANT
COVER SURGICAL LIGHT HANDLE (MISCELLANEOUS) ×2 IMPLANT
COVER WAND RF STERILE (DRAPES) ×2 IMPLANT
CUP SECTOR GRIPTON 50MM (Cup) ×1 IMPLANT
DRAPE STERI IOBAN 125X83 (DRAPES) ×2 IMPLANT
DRAPE U-SHAPE 47X51 STRL (DRAPES) ×4 IMPLANT
DRSG AQUACEL AG ADV 3.5X10 (GAUZE/BANDAGES/DRESSINGS) ×2 IMPLANT
DURAPREP 26ML APPLICATOR (WOUND CARE) ×2 IMPLANT
ELECT REM PT RETURN 15FT ADLT (MISCELLANEOUS) ×2 IMPLANT
GAUZE XEROFORM 1X8 LF (GAUZE/BANDAGES/DRESSINGS) ×2 IMPLANT
GLOVE BIO SURGEON STRL SZ7.5 (GLOVE) ×2 IMPLANT
GLOVE BIOGEL PI IND STRL 8 (GLOVE) ×2 IMPLANT
GLOVE BIOGEL PI INDICATOR 8 (GLOVE) ×2
GLOVE ECLIPSE 8.0 STRL XLNG CF (GLOVE) ×2 IMPLANT
GOWN STRL REUS W/TWL XL LVL3 (GOWN DISPOSABLE) ×4 IMPLANT
HANDPIECE INTERPULSE COAX TIP (DISPOSABLE) ×2
HEAD FEM STD 32X+1 STRL (Hips) ×1 IMPLANT
HOLDER FOLEY CATH W/STRAP (MISCELLANEOUS) ×2 IMPLANT
KIT TURNOVER KIT A (KITS) IMPLANT
LINER ACETABULAR 32X50 (Liner) ×1 IMPLANT
PACK ANTERIOR HIP CUSTOM (KITS) ×2 IMPLANT
PENCIL SMOKE EVACUATOR (MISCELLANEOUS) IMPLANT
SET HNDPC FAN SPRY TIP SCT (DISPOSABLE) ×1 IMPLANT
STAPLER VISISTAT 35W (STAPLE) IMPLANT
STEM CORAIL KA10 (Stem) ×1 IMPLANT
STRIP CLOSURE SKIN 1/2X4 (GAUZE/BANDAGES/DRESSINGS) IMPLANT
SUT ETHIBOND NAB CT1 #1 30IN (SUTURE) ×2 IMPLANT
SUT ETHILON 2 0 PS N (SUTURE) IMPLANT
SUT MNCRL AB 4-0 PS2 18 (SUTURE) IMPLANT
SUT VIC AB 0 CT1 36 (SUTURE) ×2 IMPLANT
SUT VIC AB 1 CT1 36 (SUTURE) ×2 IMPLANT
SUT VIC AB 2-0 CT1 27 (SUTURE) ×4
SUT VIC AB 2-0 CT1 TAPERPNT 27 (SUTURE) ×2 IMPLANT
TRAY FOLEY MTR SLVR 16FR STAT (SET/KITS/TRAYS/PACK) IMPLANT

## 2020-11-21 NOTE — Anesthesia Preprocedure Evaluation (Addendum)
Anesthesia Evaluation  Patient identified by MRN, date of birth, ID band Patient awake    Reviewed: Allergy & Precautions, H&P , NPO status , Patient's Chart, lab work & pertinent test results  Airway Mallampati: II  TM Distance: >3 FB Neck ROM: Full    Dental no notable dental hx. (+) Partial Lower, Partial Upper, Dental Advisory Given   Pulmonary neg pulmonary ROS,    Pulmonary exam normal breath sounds clear to auscultation       Cardiovascular hypertension, Pt. on medications and Pt. on home beta blockers + CAD, + Past MI, + CABG, + Peripheral Vascular Disease and +CHF   Rhythm:Regular Rate:Normal     Neuro/Psych negative neurological ROS  negative psych ROS   GI/Hepatic Neg liver ROS, PUD, GERD  ,  Endo/Other  diabetes, Type 2, Oral Hypoglycemic Agents  Renal/GU negative Renal ROS  negative genitourinary   Musculoskeletal  (+) Arthritis , Osteoarthritis,    Abdominal   Peds  Hematology  (+) Blood dyscrasia, anemia ,   Anesthesia Other Findings   Reproductive/Obstetrics negative OB ROS                            Anesthesia Physical Anesthesia Plan  ASA: III  Anesthesia Plan: Spinal and MAC   Post-op Pain Management:    Induction: Intravenous  PONV Risk Score and Plan: 3 and Ondansetron, Propofol infusion and Dexamethasone  Airway Management Planned: Simple Face Mask  Additional Equipment:   Intra-op Plan:   Post-operative Plan:   Informed Consent: I have reviewed the patients History and Physical, chart, labs and discussed the procedure including the risks, benefits and alternatives for the proposed anesthesia with the patient or authorized representative who has indicated his/her understanding and acceptance.     Dental advisory given  Plan Discussed with: CRNA  Anesthesia Plan Comments:         Anesthesia Quick Evaluation

## 2020-11-21 NOTE — NC FL2 (Signed)
[place] LEVEL OF CARE SCREENING TOOL     IDENTIFICATION  Patient Name: [name] Birthdate: [birth year] Sex: female Admission Date (Current Location): 11/21/2020  [facility] and [place] Number:  Herbalist and Address:  [facility],  [place], [place]      Provider Number: [id number]  Attending Physician Name and Address:  Mcarthur Rossetti  Relative Name and Phone Number:  Tedra Senegal Daughter [phone]    Current Level of Care: Hospital Recommended Level of Care: [place] Prior Approval Number:    Date Approved/Denied:   PASRR Number: [id number]  Discharge Plan: SNF    Current Diagnoses: Patient Active Problem List   Diagnosis Date Noted  . Status post total replacement of left hip 11/21/2020  . Near syncope 07/10/2020  . Hx of CABG 07/10/2020  . Preoperative cardiovascular examination 04/15/2020  . Chest pain 03/18/2020  . Unilateral primary osteoarthritis, left hip 04/03/2019  . History of right hip replacement 04/03/2019  . Status post total replacement of right hip 10/12/2018  . Unilateral primary osteoarthritis, right hip 09/12/2018  . Degenerative joint disease 05/30/2018  . Anal fissure 11/10/2017  . Rectal bleeding 11/10/2017  . Rectal pain 11/10/2017  . Inflammatory arthritis 10/13/2017  . Latent tuberculosis by blood test 10/13/2017  . Chronic diastolic CHF (congestive heart failure), NYHA class 2 ([place]) 08/11/2017  . Fatigue due to treatment 01/09/2017  . NSTEMI (non-ST elevated myocardial infarction) ([place]) 11/01/2016  . Diabetes mellitus ([place]) 10/24/2015  . Non-insulin dependent type 2 diabetes mellitus ([place]) 10/24/2015  . Angina, class I ([place]) 04/22/2015  . DOE (dyspnea on exertion) 04/22/2015  . Bilateral leg edema 04/22/2015  . Accumulation of fluid in tissues 03/21/2014  . Vertigo, central 01/02/2014  . Vertigo 01/02/2014  . Limb pain- echymosis, pain Lt  radial artery after cath 12/11/2013  . Dyslipidemia, goal LDL below 70 05/17/2013  . Essential hypertension 05/17/2013  . Claudication ([place]) 05/16/2013  . PTA/ Stent Rt popliteal and Rt peroneal artery 05/16/13 05/16/2013  . PAD,severe calcified pop and tibial peroneal trunk disease bilateraly   . ABDOMINAL PAIN RIGHT LOWER QUADRANT 10/07/2010  . CVA-STROKE 03/04/2010  . BARRETT'S ESOPHAGUS 03/04/2010  . FECAL INCONTINENCE 03/04/2010  . DM 01/14/2010  . Anemia of chronic disease 01/14/2010  . Atherosclerotic heart disease of native coronary artery with angina pectoris ([place]) 01/14/2010  . DIVERTICULOSIS OF COLON 01/14/2010  . PERSONAL HISTORY OF COLONIC POLYPS 01/14/2010  . SHELLFISH ALLERGY 01/14/2010    Orientation RESPIRATION BLADDER Height & Weight     Self,Time,Situation,Place  Normal Continent Weight: 149 lb (67.6 kg) Height:  5' 2.5" (158.8 cm)  BEHAVIORAL SYMPTOMS/MOOD NEUROLOGICAL BOWEL NUTRITION STATUS      Continent Diet (Carb Modified)  AMBULATORY STATUS COMMUNICATION OF NEEDS Skin   Extensive Assist Verbally Surgical wounds (Incision Left Hip)                       Personal Care Assistance Level of Assistance  Bathing,Feeding,Dressing Bathing Assistance: Limited assistance Feeding assistance: Independent Dressing Assistance: Limited assistance     Functional Limitations Info  Sight,Hearing,Speech Sight Info: Adequate Hearing Info: Adequate Speech Info: Adequate    SPECIAL CARE FACTORS FREQUENCY  PT (By licensed PT),OT (By licensed OT)     PT Frequency: 5x/week OT Frequency: 5x/week            Contractures Contractures Info: Not present    Additional Factors Info  Code Status,Allergies,Psychotropic Code  Status Info: Fullcode Allergies Info: Allergies: Fish Allergy, Ivp Dye (Iodinated Diagnostic Agents), Latex, Shellfish Allergy, Other, Metformin Psychotropic Info: Tramadol         Current Medications (11/21/2020):  This is the current  hospital active medication list Current Facility-Administered Medications  Medication Dose Route Frequency Provider Last Rate Last Admin  . 0.9 %  sodium chloride infusion   Intravenous Continuous Mcarthur Rossetti, MD 75 mL/hr at 11/21/20 1553 Infusion Verify at 11/21/20 1553  . [START ON 11/22/2020] acetaminophen (TYLENOL) tablet 325-650 mg  325-650 mg Oral Q6H PRN Mcarthur Rossetti, MD      . alum & mag hydroxide-simeth (MAALOX/MYLANTA) 200-200-20 MG/5ML suspension 30 mL  30 mL Oral Q4H PRN Mcarthur Rossetti, MD      . Derrill Memo ON 11/22/2020] aspirin chewable tablet 81 mg  81 mg Oral Daily Mcarthur Rossetti, MD      . carvedilol (COREG) tablet 6.25 mg  6.25 mg Oral BID Mcarthur Rossetti, MD      . ceFAZolin (ANCEF) IVPB 1 g/50 mL premix  1 g Intravenous Q6H Mcarthur Rossetti, MD   Stopped at 11/21/20 1456  . [START ON 11/22/2020] cholecalciferol (VITAMIN D3) tablet 400 Units  400 Units Oral Daily Mcarthur Rossetti, MD      . Derrill Memo ON 11/22/2020] clopidogrel (PLAVIX) tablet 75 mg  75 mg Oral Daily Mcarthur Rossetti, MD      . diphenhydrAMINE (BENADRYL) 12.5 MG/5ML elixir 12.5-25 mg  12.5-25 mg Oral Q4H PRN Mcarthur Rossetti, MD      . docusate sodium (COLACE) capsule 100 mg  100 mg Oral BID Mcarthur Rossetti, MD      . ezetimibe (ZETIA) tablet 10 mg  10 mg Oral QHS Mcarthur Rossetti, MD      . Derrill Memo ON 70/96/2836] folic acid (FOLVITE) tablet 1 mg  1 mg Oral Daily Mcarthur Rossetti, MD      . Derrill Memo ON 11/22/2020] furosemide (LASIX) tablet 40 mg  40 mg Oral Daily Mcarthur Rossetti, MD      . gabapentin (NEURONTIN) capsule 400 mg  400 mg Oral TID Mcarthur Rossetti, MD   400 mg at 11/21/20 1424  . [START ON 11/22/2020] glimepiride (AMARYL) tablet 3 mg  3 mg Oral Q breakfast Mcarthur Rossetti, MD      . HYDROcodone-acetaminophen [facility]) 7.5-325 MG per tablet 1-2 tablet  1-2 tablet Oral Q4H PRN Mcarthur Rossetti, MD      . HYDROcodone-acetaminophen (NORCO/VICODIN) 5-325 MG per tablet 1-2 tablet  1-2 tablet Oral Q4H PRN Mcarthur Rossetti, MD   1 tablet at 11/21/20 1424  . isosorbide mononitrate (IMDUR) 24 hr tablet 90 mg  90 mg Oral Daily Mcarthur Rossetti, MD      . LORazepam (ATIVAN) tablet 0.5 mg  0.5 mg Oral Q6H PRN Mcarthur Rossetti, MD      . menthol-cetylpyridinium (CEPACOL) lozenge 3 mg  1 lozenge Oral PRN Mcarthur Rossetti, MD       Or  . phenol (CHLORASEPTIC) mouth spray 1 spray  1 spray Mouth/Throat PRN Mcarthur Rossetti, MD      . methocarbamol (ROBAXIN) tablet 500 mg  500 mg Oral Q6H PRN Mcarthur Rossetti, MD       Or  . methocarbamol (ROBAXIN) 500 mg in dextrose 5 % 50 mL IVPB  500 mg Intravenous Q6H PRN Mcarthur Rossetti, MD      . metoCLOPramide (REGLAN) tablet 5-10  mg  5-10 mg Oral Q8H PRN Mcarthur Rossetti, MD       Or  . metoCLOPramide (REGLAN) injection 5-10 mg  5-10 mg Intravenous Q8H PRN Mcarthur Rossetti, MD      . morphine 2 MG/ML injection 0.5-1 mg  0.5-1 mg Intravenous Q2H PRN Mcarthur Rossetti, MD      . nitroGLYCERIN (NITROSTAT) SL tablet 0.4 mg  0.4 mg Sublingual Q5 min PRN Mcarthur Rossetti, MD      . ondansetron [facility]) tablet 4 mg  4 mg Oral Q6H PRN Mcarthur Rossetti, MD       Or  . ondansetron [facility]) injection 4 mg  4 mg Intravenous Q6H PRN Mcarthur Rossetti, MD      . pantoprazole (PROTONIX) EC tablet 40 mg  40 mg Oral BID Mcarthur Rossetti, MD      . Derrill Memo ON 11/22/2020] predniSONE (DELTASONE) tablet 5 mg  5 mg Oral Q breakfast Mcarthur Rossetti, MD      . rosuvastatin (CRESTOR) tablet 40 mg  40 mg Oral QHS Mcarthur Rossetti, MD      . sulfaSALAzine (AZULFIDINE) EC tablet 500 mg  500 mg Oral BID Mcarthur Rossetti, MD      . traMADol [facility]) tablet 50 mg  50 mg Oral Q6H Mcarthur Rossetti, MD         Discharge Medications: Please see  discharge summary for a list of discharge medications.  Relevant Imaging Results:  Relevant Lab Results:   Additional Information FBP:[id number]  Lia Hopping, LCSW

## 2020-11-21 NOTE — Telephone Encounter (Signed)
Pt with + UC from ED 11/17/20  PT in Inpatient  Pharm D will discuss with provider per Sharlene Dory D

## 2020-11-21 NOTE — Evaluation (Addendum)
Physical Therapy Evaluation Patient Details Name: [name] MRN: [id number] DOB: 1933-02-25 Today's Date: 11/21/2020   History of Present Illness  Patient is 86 y.o. female s/p Lt THA anterior approach on 11/21/20 with PMH significant for OA, vertigo, PAD, CAD, CHF, DM, HTN, GERD, HLD, NSTEMI (2016), Rt THA (2019), CABG (98).    Clinical Impression  SHAKETA SERAFIN is a 86 y.o. female POD 0 s/p Lt THA. Patient reports modified independence with SPC for household mobility at baseline. Patient is now limited by functional impairments (see PT problem list below) and requires min assist for transfers and gait with RW. Patient was able to take several small steps to move bed>chair with RW and min assist. Patient instructed in exercise to facilitate circulation. Patient will benefit from continued skilled PT interventions to address impairments and progress towards PLOF. Acute PT will follow to progress mobility and stair training in preparation for safe discharge. Recommending SNF follow up for rehab services prior to return home.    Follow Up Recommendations SNF    Equipment Recommendations  Rolling walker with 5" wheels (youth walker)    Recommendations for Other Services       Precautions / Restrictions Precautions Precautions: Fall Restrictions Weight Bearing Restrictions: No Other Position/Activity Restrictions: WBAT      Mobility  Bed Mobility Overal bed mobility: Needs Assistance Bed Mobility: Supine to Sit     Supine to sit: Min assist;HOB elevated;Mod assist     General bed mobility comments: min assist to walk Bil LE's towards EOB and mod assist to raise trunk and fully pivot with use of bed pad and pt using bed rail.    Transfers Overall transfer level: Needs assistance Equipment used: Rolling walker (2 wheeled) Transfers: Sit to/from Stand Sit to Stand: Min assist         General transfer comment: cues for hand placement on RW, and min assist for power up  from EOB.  Ambulation/Gait Ambulation/Gait assistance: Min assist Gait Distance (Feet): 6 Feet Assistive device: Rolling walker (2 wheeled) Gait Pattern/deviations: Step-to pattern;Decreased stride length Gait velocity: decr   General Gait Details: cues for proximity to RW and step pattern. no overt LOB and min assist to manage walker to step to recliner.  Stairs            Wheelchair Mobility    Modified Rankin (Stroke Patients Only)       Balance Overall balance assessment: Needs assistance Sitting-balance support: Feet supported Sitting balance-Leahy Scale: Good     Standing balance support: During functional activity;Bilateral upper extremity supported Standing balance-Leahy Scale: Poor                               Pertinent Vitals/Pain Pain Assessment: 0-10 Pain Score: 7  Pain Location: Lt hip Pain Descriptors / Indicators: Aching;Discomfort Pain Intervention(s): Limited activity within patient's tolerance;Monitored during session;Repositioned;RN gave pain meds during session    [place] expects to be discharged to:: Skilled nursing facility Living Arrangements: Alone   Type of Home: House Home Access: Ramped entrance     Home Layout: One level Home Equipment: Cane - single point;Shower seat;Grab bars - tub/shower;Bedside commode      Prior Function Level of Independence: Independent with assistive device(s);Needs assistance   Gait / Transfers Assistance Needed: pt uses SPC in home. she requires daughter for transportation out of home as she no longer drives. pt is unable to transfer in/out  of tub and does sink baths instead.  ADL's / Homemaking Assistance Needed: pt's daughter picks up food sometimes for her. pt has mostly freezer meals that she can microwave.  Comments: 2 months pt has not been able to drive or get out of house. she has been mobilizing in house with [facility].     Hand Dominance   Dominant Hand:  Right    Extremity/Trunk Assessment   Upper Extremity Assessment Upper Extremity Assessment: Generalized weakness    Lower Extremity Assessment Lower Extremity Assessment: Generalized weakness    Cervical / Trunk Assessment Cervical / Trunk Assessment: Kyphotic  Communication   Communication: No difficulties  Cognition Arousal/Alertness: Awake/alert Behavior During Therapy: WFL for tasks assessed/performed Overall Cognitive Status: Within Functional Limits for tasks assessed                                        General Comments      Exercises Total Joint Exercises Ankle Circles/Pumps: AROM;Both;20 reps;Seated   Assessment/Plan    PT Assessment Patient needs continued PT services  PT Problem List Decreased strength;Decreased range of motion;Decreased activity tolerance;Decreased balance;Decreased mobility;Decreased knowledge of use of DME;Decreased knowledge of precautions       PT Treatment Interventions DME instruction;Gait training;Stair training;Therapeutic exercise;Therapeutic activities;Functional mobility training;Balance training;Patient/family education    PT Goals (Current goals can be found in the Care Plan section)  Acute Rehab PT Goals Patient Stated Goal: move better and got to rehab before home PT Goal Formulation: With patient Time For Goal Achievement: 11/28/20 Potential to Achieve Goals: Good    Frequency 7X/week   Barriers to discharge Decreased caregiver support      Co-evaluation               AM-PAC PT "6 Clicks" Mobility  Outcome Measure Help needed turning from your back to your side while in a flat bed without using bedrails?: A Little Help needed moving from lying on your back to sitting on the side of a flat bed without using bedrails?: A Little Help needed moving to and from a bed to a chair (including a wheelchair)?: A Little Help needed standing up from a chair using your arms (e.g., wheelchair or bedside  chair)?: A Little Help needed to walk in hospital room?: A Lot Help needed climbing 3-5 steps with a railing? : A Lot 6 Click Score: 16    End of Session Equipment Utilized During Treatment: Gait belt Activity Tolerance: Patient tolerated treatment well Patient left: in chair;with call bell/phone within reach;with chair alarm set Nurse Communication: Mobility status PT Visit Diagnosis: Muscle weakness (generalized) (M62.81);Difficulty in walking, not elsewhere classified (R26.2)    Time: 5329-9242 PT Time Calculation (min) (ACUTE ONLY): 25 min   Charges:   PT Evaluation $PT Eval Low Complexity: 1 Low PT Treatments $Therapeutic Activity: 8-22 mins        Verner Mould, DPT Acute Rehabilitation Services  Office [phone] Pager [phone]  11/21/2020 3:09 PM

## 2020-11-21 NOTE — Transfer of Care (Signed)
Immediate Anesthesia Transfer of Care Note  Patient: [name]  Procedure(s) Performed: LEFT TOTAL HIP ARTHROPLASTY ANTERIOR APPROACH (Left Hip)  Patient Location: PACU  Anesthesia Type:Spinal  Level of Consciousness: drowsy and responds to stimulation  Airway & Oxygen Therapy: Patient Spontanous Breathing and Patient connected to face mask oxygen  Post-op Assessment: Report given to RN and Post -op Vital signs reviewed and stable  Post vital signs: Reviewed and stable  Last Vitals:  Vitals Value Taken Time  BP 112/48 11/21/20 1001  Temp    Pulse 52 11/21/20 1003  Resp 16 11/21/20 1003  SpO2 100 % 11/21/20 1003  Vitals shown include unvalidated device data.  Last Pain:  Vitals:   11/21/20 0705  TempSrc:   PainSc: 8       Patients Stated Pain Goal: 7 (63/94/32 0037)  Complications: No complications documented.

## 2020-11-21 NOTE — TOC Initial Note (Signed)
Transition of Care [facility]) - Initial/Assessment Note    Patient Details  Name: [name] MRN: [id number] Date of Birth: 1933/01/21  Transition of Care [facility]) CM/SW Contact:    Lia Hopping, [place] Phone Number: 11/21/2020, 3:57 PM  Clinical Narrative:     Patient admitted for Left Hip Arthroplasty surgical intervention.  Re:SNF placement for transitional rehab          CSW discussed rehab with the patient. Patient lives independently and will not have as much support from her daughter as plan. Patient reports she has been to [facility] SNF in the past for rehab and would like to go there again, if possible.  She has requested not to go to [facility] because her spouse lives there and she knows it will upset him when she leaves to go home.   Patient gave csw permission to send referrals to SNF's.  Patient will need a covid test prior to discharge.  PASRR/FL2 complete.   TOC staff will continue to follow this patient.  Expected Discharge Plan: Skilled Nursing Facility Barriers to Discharge: Continued Medical Work up   Patient Goals and CMS Choice   CMS Medicare.gov Compare Post Acute Care list provided to:: Patient Choice offered to / list presented to : Patient  Expected Discharge Plan and Services Expected Discharge Plan: [place] In-house Referral: Clinical Social Work Discharge Planning Services: CM Consult Post Acute Care Choice: [place] Living arrangements for the past 2 months: Single Family Home                 DME Arranged:  (No DME needed at this time as patient verbalized she would like to go to a SNF for transitional rehab)                    Prior Living Arrangements/Services Living arrangements for the past 2 months: Single Family Home Lives with:: Self Patient language and need for interpreter reviewed:: No        Need for Family Participation in Patient Care: Yes (Comment) Care giver support system in place?:  Yes (comment) Current home services: DME Criminal Activity/Legal Involvement Pertinent to Current Situation/Hospitalization: No - Comment as needed  Activities of Daily Living Home Assistive Devices/Equipment: CPAP,Walker (specify type),Dentures (specify type) ADL Screening (condition at time of admission) Patient's cognitive ability adequate to safely complete daily activities?: Yes Is the patient deaf or have difficulty hearing?: No Does the patient have difficulty seeing, even when wearing glasses/contacts?: No Does the patient have difficulty concentrating, remembering, or making decisions?: Yes Patient able to express need for assistance with ADLs?: Yes Does the patient have difficulty dressing or bathing?: No Independently performs ADLs?: Yes (appropriate for developmental age) Does the patient have difficulty walking or climbing stairs?: Yes Weakness of Legs: Left Weakness of Arms/Hands: None  Permission Sought/Granted   Permission granted to share information with : Yes, Verbal Permission Granted  Share Information with NAME: Drayton,Felicia  Permission granted to share info w AGENCY: SNF's in local area  Permission granted to share info w Relationship: Daughter  Permission granted to share info w Contact Information: [phone]  Emotional Assessment Appearance:: Appears stated age Attitude/Demeanor/Rapport: Engaged Affect (typically observed): Accepting Orientation: : Oriented to Self,Oriented to Place,Oriented to  Time,Oriented to Situation Alcohol / Substance Use: Not Applicable Psych Involvement: No (comment)  Admission diagnosis:  Status post total replacement of left hip [Z96.642] Patient Active Problem List   Diagnosis Date Noted  . Status post  total replacement of left hip 11/21/2020  . Near syncope 07/10/2020  . Hx of CABG 07/10/2020  . Preoperative cardiovascular examination 04/15/2020  . Chest pain 03/18/2020  . Unilateral primary osteoarthritis, left  hip 04/03/2019  . History of right hip replacement 04/03/2019  . Status post total replacement of right hip 10/12/2018  . Unilateral primary osteoarthritis, right hip 09/12/2018  . Degenerative joint disease 05/30/2018  . Anal fissure 11/10/2017  . Rectal bleeding 11/10/2017  . Rectal pain 11/10/2017  . Inflammatory arthritis 10/13/2017  . Latent tuberculosis by blood test 10/13/2017  . Chronic diastolic CHF (congestive heart failure), NYHA class 2 ([place]) 08/11/2017  . Fatigue due to treatment 01/09/2017  . NSTEMI (non-ST elevated myocardial infarction) ([place]) 11/01/2016  . Diabetes mellitus ([place]) 10/24/2015  . Non-insulin dependent type 2 diabetes mellitus ([place]) 10/24/2015  . Angina, class I ([place]) 04/22/2015  . DOE (dyspnea on exertion) 04/22/2015  . Bilateral leg edema 04/22/2015  . Accumulation of fluid in tissues 03/21/2014  . Vertigo, central 01/02/2014  . Vertigo 01/02/2014  . Limb pain- echymosis, pain Lt radial artery after cath 12/11/2013  . Dyslipidemia, goal LDL below 70 05/17/2013  . Essential hypertension 05/17/2013  . Claudication ([place]) 05/16/2013  . PTA/ Stent Rt popliteal and Rt peroneal artery 05/16/13 05/16/2013  . PAD,severe calcified pop and tibial peroneal trunk disease bilateraly   . ABDOMINAL PAIN RIGHT LOWER QUADRANT 10/07/2010  . CVA-STROKE 03/04/2010  . BARRETT'S ESOPHAGUS 03/04/2010  . FECAL INCONTINENCE 03/04/2010  . DM 01/14/2010  . Anemia of chronic disease 01/14/2010  . Atherosclerotic heart disease of native coronary artery with angina pectoris ([place]) 01/14/2010  . DIVERTICULOSIS OF COLON 01/14/2010  . PERSONAL HISTORY OF COLONIC POLYPS 01/14/2010  . [place] ALLERGY 01/14/2010   PCP:  Jani Gravel, MD Pharmacy:   [id number] - [place], [name] [address] Phone: [phone] Fax: [phone]     Social Determinants of Health (SDOH) Interventions    Readmission Risk  Interventions No flowsheet data found.

## 2020-11-21 NOTE — Progress Notes (Signed)
ED Antimicrobial Stewardship Positive Culture Follow Up   [name] is an 86 y.o. female who presented to [facility] on 11/17/2020 with a chief complaint of AMS, nausea and hypotension.  Chief Complaint  Patient presents with  . possible stroke    Recent Results (from the past 720 hour(s))  Surgical pcr screen     Status: Abnormal   Collection Time: 11/17/20  2:17 PM   Specimen: Nasal Mucosa; Nasal Swab  Result Value Ref Range Status   MRSA, PCR NEGATIVE NEGATIVE Final   Staphylococcus aureus POSITIVE (A) NEGATIVE Final    Comment: (NOTE) The Xpert SA Assay (FDA approved for NASAL specimens in patients 35 years of age and older), is one component of a comprehensive surveillance program. It is not intended to diagnose infection nor to guide or monitor treatment. Performed at [facility], [place] [address], [place], [place] [address]   Urine culture     Status: Abnormal   Collection Time: 11/17/20  6:16 PM   Specimen: Urine, Random  Result Value Ref Range Status   Specimen Description   Final    URINE, RANDOM Performed at [place] [facility] [address] [place], [place] [address]    Special Requests   Final    NONE Performed at [facility], [place] [address] [place], [place] [address]    Culture (A)  Final    20,000 COLONIES/mL KLEBSIELLA PNEUMONIAE 30,000 COLONIES/mL ESCHERICHIA COLI    Report Status 11/20/2020 FINAL  Final   Organism ID, Bacteria KLEBSIELLA PNEUMONIAE (A)  Final   Organism ID, Bacteria ESCHERICHIA COLI (A)  Final      Susceptibility   Escherichia coli - MIC*    AMPICILLIN >=32 RESISTANT Resistant     CEFAZOLIN <=4 SENSITIVE Sensitive     CEFEPIME <=0.12 SENSITIVE Sensitive     CEFTRIAXONE <=0.25 SENSITIVE Sensitive     CIPROFLOXACIN <=0.25 SENSITIVE Sensitive     GENTAMICIN <=1 SENSITIVE Sensitive     IMIPENEM <=0.25 SENSITIVE Sensitive     NITROFURANTOIN <=16 SENSITIVE Sensitive      TRIMETH/SULFA >=320 RESISTANT Resistant     AMPICILLIN/SULBACTAM 16 INTERMEDIATE Intermediate     PIP/TAZO <=4 SENSITIVE Sensitive     * 30,000 COLONIES/mL ESCHERICHIA COLI   Klebsiella pneumoniae - MIC*    AMPICILLIN RESISTANT Resistant     CEFAZOLIN <=4 SENSITIVE Sensitive     CEFEPIME <=0.12 SENSITIVE Sensitive     CEFTRIAXONE <=0.25 SENSITIVE Sensitive     CIPROFLOXACIN <=0.25 SENSITIVE Sensitive     GENTAMICIN <=1 SENSITIVE Sensitive     IMIPENEM <=0.25 SENSITIVE Sensitive     NITROFURANTOIN 64 INTERMEDIATE Intermediate     TRIMETH/SULFA <=20 SENSITIVE Sensitive     AMPICILLIN/SULBACTAM 4 SENSITIVE Sensitive     PIP/TAZO <=4 SENSITIVE Sensitive     * 20,000 COLONIES/mL KLEBSIELLA PNEUMONIAE  SARS CORONAVIRUS 2 (TAT 6-24 HRS) Nasopharyngeal Nasopharyngeal Swab     Status: None   Collection Time: 11/18/20  1:05 PM   Specimen: Nasopharyngeal Swab  Result Value Ref Range Status   SARS Coronavirus 2 NEGATIVE NEGATIVE Final    Comment: (NOTE) SARS-CoV-2 target nucleic acids are NOT DETECTED.  The SARS-CoV-2 RNA is generally detectable in upper and lower respiratory specimens during the acute phase of infection. Negative results do not preclude SARS-CoV-2 infection, do not rule out co-infections with other pathogens, and should not be used as the sole basis for treatment or other patient management decisions. Negative results must  be combined with clinical observations, patient history, and epidemiological information. The expected result is Negative.  Fact Sheet for Patients: SugarRoll.be  Fact Sheet for Healthcare Providers: https://www.woods-mathews.com/  This test is not yet approved or cleared by the [place] FDA and  has been authorized for detection and/or diagnosis of SARS-CoV-2 by FDA under an Emergency Use Authorization (EUA). This EUA will remain  in effect (meaning this test can be used) for the duration of  the COVID-19 declaration under Se ction 564(b)(1) of the Act, 21 U.S.C. section 360bbb-3(b)(1), unless the authorization is terminated or revoked sooner.  Performed at [facility], [place] [address], [place], [place] [address]     Patient is currently inpatient at [facility] (for left THA). Informed Dr. Colleen Can of culture results.  Lynelle Doctor 11/21/2020, 11:27 AM Clinical Pharmacist [phone]

## 2020-11-21 NOTE — Care Plan (Signed)
Ortho Bundle Case Management Note  Patient Details  Name: [name] MRN: [id number] Date of Birth: 03/10/1933  RNCM call to patient to discuss her Left total hip arthroplasty with Dr. Ninfa Linden. She is an Ortho bundle patient through [facility] and is agreeable to case management. She has a daughter that she feels would be willing to help her, but states she doesn't feel she'd be available to go home. She would like to go to SNF rehab after hospitalization for a few days in order to go home, where she lives alone. She has a walker and cane she states. She has requested [facility] for SNF. CM will make TOC team aware once surgery is completed. Updated Dr. Ninfa Linden on patient's discharge plan. Will continue to follow for needs.         DME Arranged:   (No DME needed at this time as patient verbalized she would like to go to a SNF for transitional rehab) DME Agency:     [place]:    [place] Agency:     Additional Comments: Please contact me with any questions of if this plan should need to change.  Jamse Arn, RN, BSN, [employer]  [phone] 11/21/2020, 2:09 PM

## 2020-11-21 NOTE — Anesthesia Postprocedure Evaluation (Signed)
Anesthesia Post Note  Patient: [name]  Procedure(s) Performed: LEFT TOTAL HIP ARTHROPLASTY ANTERIOR APPROACH (Left Hip)     Patient location during evaluation: PACU Anesthesia Type: MAC and Spinal Level of consciousness: oriented and awake and alert Pain management: pain level controlled Vital Signs Assessment: post-procedure vital signs reviewed and stable Respiratory status: spontaneous breathing, respiratory function stable and patient connected to nasal cannula oxygen Cardiovascular status: blood pressure returned to baseline and stable Postop Assessment: no headache, no backache, no apparent nausea or vomiting, spinal receding and patient able to bend at knees Anesthetic complications: no   No complications documented.  Last Vitals:  Vitals:   11/21/20 1200 11/21/20 1215  BP: (!) 148/64 (!) 123/98  Pulse: (!) 44 (!) 48  Resp: 11 16  Temp:  36.7 C  SpO2: 100% 100%    Last Pain:  Vitals:   11/21/20 1200  TempSrc:   PainSc: 0-No pain                 Tulip Meharg,W. EDMOND

## 2020-11-21 NOTE — Interval H&P Note (Signed)
History and Physical Interval Note: The patient is here today for a left total hip arthroplasty to treat the pain from her left hip osteoarthritis.  There has been no acute change in medical status.  See recent H&P.  The risk and benefits of surgery been explained in detail and informed consent is obtained.  The left hip is been marked.  11/21/2020 7:04 AM  Garry Heater  has presented today for surgery, with the diagnosis of Osteoarthritis Left Hip.  The various methods of treatment have been discussed with the patient and family. After consideration of risks, benefits and other options for treatment, the patient has consented to  Procedure(s): LEFT TOTAL HIP ARTHROPLASTY ANTERIOR APPROACH (Left) as a surgical intervention.  The patient's history has been reviewed, patient examined, no change in status, stable for surgery.  I have reviewed the patient's chart and labs.  Questions were answered to the patient's satisfaction.     Mcarthur Rossetti

## 2020-11-21 NOTE — Brief Op Note (Signed)
11/21/2020  9:40 AM  PATIENT:  [name]  86 y.o. female  PRE-OPERATIVE DIAGNOSIS:  Osteoarthritis Left Hip  POST-OPERATIVE DIAGNOSIS:  Osteoarthritis Left Hip  PROCEDURE:  Procedure(s): LEFT TOTAL HIP ARTHROPLASTY ANTERIOR APPROACH (Left)  SURGEON:  Surgeon(s) and Role:    Mcarthur Rossetti, MD - Primary  PHYSICIAN ASSISTANT:  Benita Stabile, PA-C  ANESTHESIA:   spinal  EBL:  150 mL   COUNTS:  YES  DICTATION: .Other Dictation: Dictation Number [id number]  PLAN OF CARE: Admit for overnight observation  PATIENT DISPOSITION:  PACU - hemodynamically stable.   Delay start of Pharmacological VTE agent (>24hrs) due to surgical blood loss or risk of bleeding: no

## 2020-11-21 NOTE — Op Note (Signed)
NAME: [name], FULL MEDICAL RECORD UY:[id number] ACCOUNT [id number] DATE OF BIRTH:1933/12/07 FACILITY: [facility] LOCATION: [facility] PHYSICIAN:Elta Angell [place], MD  OPERATIVE REPORT  DATE OF PROCEDURE:  11/21/2020  PREOPERATIVE DIAGNOSES:  Primary osteoarthritis and degenerative joint disease, right hip.  POSTOPERATIVE DIAGNOSES:  Primary osteoarthritis and degenerative joint disease, right hip.  PROCEDURE:  Right total hip arthroplasty through direct anterior approach.  IMPLANTS:  [employer] acetabular component size 50, size 32+0 neutral polyethylene liner, size 10 Corail femoral component with standard offset, size 32+1 metal hip ball.  SURGEON:  Lind Guest. Ninfa Linden, MD  ASSISTANT:  Erskine Emery, PA-C  ANESTHESIA:  Spinal.  ANTIBIOTICS:  Two grams IV Ancef.  BLOOD LOSS:  150 mL.  COMPLICATIONS:  None.  INDICATIONS:  The patient is a very pleasant 86 year old female well known to me.  She has well documented severe osteoarthritis and degenerative joint disease of her left hip.  We actually replaced her right hip several years ago.  At this point, her  left hip pain is daily and is detrimentally affecting her mobility, her quality of life and activities of daily living to the point she does wish to proceed with a total hip arthroplasty on the left side.  I have talked to her in detail about the risk of  acute blood loss anemia, nerve and vessel injury, fracture, infection, dislocation, DVT and implant failure.  She understands our goals are decrease pain, improve mobility and overall improve quality of life.  DESCRIPTION OF PROCEDURE:  After informed consent was obtained and appropriate left hip was marked, she was brought to the operating room and sat up on the stretcher where spinal anesthesia was obtained.  She was laid in supine position on the stretcher.   Traction boots were placed on both her feet.  A Foley catheter was placed and next, she was placed supine  on the Hana fracture table, with the perineal post in place and both legs in line skeletal traction device and no traction applied.  Her left  operative hip was prepped and draped with DuraPrep and sterile drapes.  A timeout was called and she was identified as correct patient, correct left hip.  I then made an incision just inferior and posterior to the anterior superior iliac spine and  carried this obliquely down the leg.  We dissected down tensor fascia lata muscle.  Tensor fascia was then divided longitudinally to proceed with direct anterior approach to the hip.  We identified and cauterized circumflex vessels and then identified  the hip capsule, opened the hip capsule in an L-type format, finding a moderate joint effusion and significant disease around the lateral femoral head and neck.  I placed Cobra retractors within the joint capsule around the medial and lateral femoral  neck and made our femoral neck cut just proximal to the lesser trochanter with an oscillating saw and completed this with an osteotome.  I then placed a corkscrew guide in the femoral head and removed the femoral head in its entirety and found a wide  area devoid of cartilage.  I then placed a bent Hohmann over the medial acetabular rim and removed remnants of acetabular labrum and other debris.  I then began reaming in stepwise increments from a size 43 reamer, going up to a size 49 with all reamers  placed under direct visualization, the last reamer under direct fluoroscopy, so we could obtain our depth of reaming, our inclination and anteversion.  I then placed the real [employer]  acetabular component size 50 and a 32+0 neutral  polyethylene liner for that size acetabular component.  Attention was then turned to the femur.  With the leg externally rotated to 120 degrees, extended and adducted, I was able to place a Mueller retractor medially and Hohmann retractor behind the  greater trochanter.  I released lateral  joint capsule and used a box-cutting osteotome to enter the femoral canal and a rongeur to lateralize, then began broaching using the Corail broaching system from a size 8 going up to a size 10.  With a size 10 in  place, we trialed a standard offset femoral neck and a 32+1 trial hip ball, reduced this in the acetabulum and I was pleased with leg length, offset, range of motion and stability, assessed radiographically and mechanically.  I then dislocated the hip  and removed the trial components.  I placed the real Corail femoral component size 10 with standard offset and the real 32+1 metal hip ball and again reduced this in the acetabulum and we were pleased with stability.  We then irrigated the soft tissue  with normal saline solution using pulsatile lavage.  I assessed the implants again under direct fluoroscopy as well.  We then closed the joint capsule with interrupted #1 Ethibond suture, followed by closing the tensor fascia with #1 Vicryl.  0 Vicryl  was used to close the deep tissue and 2-0 Vicryl was used to close subcutaneous tissue.  The skin was reapproximated with staples.  An Aquacel dressing was applied.  She was taken off the Hana table and taken to recovery room in stable condition with all  final counts being correct.  No complications noted.  Of note, Benita Stabile, PA-C, did assist during the entire case from beginning to end and his assistance was crucial for facilitating every aspect of this case.  HN/NUANCE  D:11/21/2020 T:11/21/2020 JOB:[id number]

## 2020-11-21 NOTE — Anesthesia Procedure Notes (Signed)
Spinal  Patient location during procedure: OR Start time: 11/21/2020 8:30 AM End time: 11/21/2020 8:35 AM Staffing Performed: anesthesiologist  Anesthesiologist: Roderic Palau, MD Preanesthetic Checklist Completed: patient identified, IV checked, risks and benefits discussed, surgical consent, monitors and equipment checked, pre-op evaluation and timeout performed Spinal Block Patient position: sitting Prep: DuraPrep Patient monitoring: cardiac monitor, continuous pulse ox and blood pressure Approach: midline Location: L3-4 Injection technique: single-shot Needle Needle type: Quincke  Needle gauge: 22 G Needle length: 9 cm Assessment Sensory level: T8 Additional Notes Functioning IV was confirmed and monitors were applied. Sterile prep and drape, including hand hygiene and sterile gloves were used. The patient was positioned and the spine was prepped. The skin was anesthetized with lidocaine.  Free flow of clear CSF was obtained prior to injecting local anesthetic into the CSF.  The spinal needle aspirated freely following injection.  The needle was carefully withdrawn.  The patient tolerated the procedure well.

## 2020-11-22 DIAGNOSIS — M2550 Pain in unspecified joint: Secondary | ICD-10-CM | POA: Diagnosis not present

## 2020-11-22 DIAGNOSIS — Z8615 Personal history of latent tuberculosis infection: Secondary | ICD-10-CM | POA: Diagnosis not present

## 2020-11-22 DIAGNOSIS — I70201 Unspecified atherosclerosis of native arteries of extremities, right leg: Secondary | ICD-10-CM | POA: Diagnosis not present

## 2020-11-22 DIAGNOSIS — Z8673 Personal history of transient ischemic attack (TIA), and cerebral infarction without residual deficits: Secondary | ICD-10-CM | POA: Diagnosis not present

## 2020-11-22 DIAGNOSIS — I2510 Atherosclerotic heart disease of native coronary artery without angina pectoris: Secondary | ICD-10-CM | POA: Diagnosis not present

## 2020-11-22 DIAGNOSIS — Z8719 Personal history of other diseases of the digestive system: Secondary | ICD-10-CM | POA: Diagnosis not present

## 2020-11-22 DIAGNOSIS — E119 Type 2 diabetes mellitus without complications: Secondary | ICD-10-CM | POA: Diagnosis not present

## 2020-11-22 DIAGNOSIS — Z471 Aftercare following joint replacement surgery: Secondary | ICD-10-CM | POA: Diagnosis not present

## 2020-11-22 DIAGNOSIS — M1612 Unilateral primary osteoarthritis, left hip: Secondary | ICD-10-CM | POA: Diagnosis not present

## 2020-11-22 DIAGNOSIS — I252 Old myocardial infarction: Secondary | ICD-10-CM | POA: Diagnosis not present

## 2020-11-22 DIAGNOSIS — Z96642 Presence of left artificial hip joint: Secondary | ICD-10-CM | POA: Diagnosis not present

## 2020-11-22 DIAGNOSIS — R6889 Other general symptoms and signs: Secondary | ICD-10-CM | POA: Diagnosis not present

## 2020-11-22 DIAGNOSIS — R531 Weakness: Secondary | ICD-10-CM | POA: Diagnosis not present

## 2020-11-22 DIAGNOSIS — Z8711 Personal history of peptic ulcer disease: Secondary | ICD-10-CM | POA: Diagnosis not present

## 2020-11-22 DIAGNOSIS — I110 Hypertensive heart disease with heart failure: Secondary | ICD-10-CM | POA: Diagnosis not present

## 2020-11-22 DIAGNOSIS — K219 Gastro-esophageal reflux disease without esophagitis: Secondary | ICD-10-CM | POA: Diagnosis present

## 2020-11-22 DIAGNOSIS — Z8249 Family history of ischemic heart disease and other diseases of the circulatory system: Secondary | ICD-10-CM | POA: Diagnosis not present

## 2020-11-22 DIAGNOSIS — Z888 Allergy status to other drugs, medicaments and biological substances status: Secondary | ICD-10-CM | POA: Diagnosis not present

## 2020-11-22 DIAGNOSIS — I5032 Chronic diastolic (congestive) heart failure: Secondary | ICD-10-CM | POA: Diagnosis not present

## 2020-11-22 DIAGNOSIS — Z95820 Peripheral vascular angioplasty status with implants and grafts: Secondary | ICD-10-CM | POA: Diagnosis not present

## 2020-11-22 DIAGNOSIS — E785 Hyperlipidemia, unspecified: Secondary | ICD-10-CM | POA: Diagnosis present

## 2020-11-22 DIAGNOSIS — Z743 Need for continuous supervision: Secondary | ICD-10-CM | POA: Diagnosis not present

## 2020-11-22 DIAGNOSIS — Z91041 Radiographic dye allergy status: Secondary | ICD-10-CM | POA: Diagnosis not present

## 2020-11-22 DIAGNOSIS — Z951 Presence of aortocoronary bypass graft: Secondary | ICD-10-CM | POA: Diagnosis not present

## 2020-11-22 DIAGNOSIS — Z9861 Coronary angioplasty status: Secondary | ICD-10-CM | POA: Diagnosis not present

## 2020-11-22 DIAGNOSIS — M25452 Effusion, left hip: Secondary | ICD-10-CM | POA: Diagnosis not present

## 2020-11-22 DIAGNOSIS — Z7984 Long term (current) use of oral hypoglycemic drugs: Secondary | ICD-10-CM | POA: Diagnosis not present

## 2020-11-22 DIAGNOSIS — Z7401 Bed confinement status: Secondary | ICD-10-CM | POA: Diagnosis not present

## 2020-11-22 DIAGNOSIS — E1151 Type 2 diabetes mellitus with diabetic peripheral angiopathy without gangrene: Secondary | ICD-10-CM | POA: Diagnosis not present

## 2020-11-22 DIAGNOSIS — Z83438 Family history of other disorder of lipoprotein metabolism and other lipidemia: Secondary | ICD-10-CM | POA: Diagnosis not present

## 2020-11-22 DIAGNOSIS — Z20822 Contact with and (suspected) exposure to covid-19: Secondary | ICD-10-CM | POA: Diagnosis not present

## 2020-11-22 DIAGNOSIS — Z91013 Allergy to seafood: Secondary | ICD-10-CM | POA: Diagnosis not present

## 2020-11-22 LAB — BASIC METABOLIC PANEL
Anion gap: 8 (ref 5–15)
BUN: 13 mg/dL (ref 8–23)
CO2: 28 mmol/L (ref 22–32)
Calcium: 8.1 mg/dL — ABNORMAL LOW (ref 8.9–10.3)
Chloride: 103 mmol/L (ref 98–111)
Creatinine, Ser: 0.88 mg/dL (ref 0.44–1.00)
GFR, Estimated: >60 mL/min (ref >60)
Glucose, Bld: 94 mg/dL (ref 70–99)
Potassium: 3.8 mmol/L (ref 3.5–5.1)
Sodium: 139 mmol/L (ref 135–145)

## 2020-11-22 LAB — CBC
HCT: 29.1 % — ABNORMAL LOW (ref 36.0–46.0)
Hemoglobin: 8.9 g/dL — ABNORMAL LOW (ref 12.0–15.0)
MCH: 28.4 pg (ref 26.0–34.0)
MCHC: 30.6 g/dL (ref 30.0–36.0)
MCV: 93 fL (ref 80.0–100.0)
Platelets: 192 10*3/uL (ref 150–400)
RBC: 3.13 MIL/uL — ABNORMAL LOW (ref 3.87–5.11)
RDW: 13.7 % (ref 11.5–15.5)
WBC: 6.2 10*3/uL (ref 4.0–10.5)
nRBC: 0 % (ref 0.0–0.2)

## 2020-11-22 NOTE — Plan of Care (Signed)
  Problem: Pain Managment: ?Goal: General experience of comfort will improve ?Outcome: Progressing ?  ?Problem: Pain Management: ?Goal: Pain level will decrease with appropriate interventions ?Outcome: Progressing ?  ?

## 2020-11-22 NOTE — Progress Notes (Signed)
  Subjective: Patient stable.  She is motivated to get up with therapy.   Objective: Vital signs in last 24 hours: Temp:  [96.1 F (35.6 C)-98.8 F (37.1 C)] 97.6 F (36.4 C) (12/11 0543) Pulse Rate:  [43-68] 49 (12/11 0543) Resp:  [10-17] 15 (12/11 0543) BP: (104-194)/(46-98) 106/46 (12/11 0543) SpO2:  [89 %-100 %] 89 % (12/11 0543)  Intake/Output from previous day: 12/10 0701 - 12/11 0700 In: 2703.2 [P.O.:480; I.V.:1923.2; IV Piggyback:300] Out: 1250 [Urine:1100; Blood:150] Intake/Output this shift: No intake/output data recorded.  Exam:  Intact pulses distally Dorsiflexion/Plantar flexion intact No cellulitis present  Labs: Recent Labs    11/22/20 0314  HGB 8.9*   Recent Labs    11/22/20 0314  WBC 6.2  RBC 3.13*  HCT 29.1*  PLT 192   Recent Labs    11/22/20 0314  NA 139  K 3.8  CL 103  CO2 28  BUN 13  CREATININE 0.88  GLUCOSE 94  CALCIUM 8.1*   No results for input(s): LABPT, INR in the last 72 hours.  Assessment/Plan: Plan at this time is mobilize with physical therapy.  Anticipate possible discharge tomorrow.  Pain is well controlled.   [name] 11/22/2020, 9:15 AM

## 2020-11-22 NOTE — Plan of Care (Signed)
  Problem: Health Behavior/Discharge Planning: Goal: Ability to manage health-related needs will improve Outcome: Progressing   Problem: Clinical Measurements: Goal: Ability to maintain clinical measurements within normal limits will improve Outcome: Progressing   Problem: Activity: Goal: Risk for activity intolerance will decrease Outcome: Progressing   Problem: Clinical Measurements: Goal: Cardiovascular complication will be avoided Outcome: Progressing

## 2020-11-22 NOTE — Progress Notes (Signed)
Patient ID: [name], female   DOB: 04-01-1933, 86 y.o.   MRN: [id number] The patient and her daughter are both still insistent that she needs short-term skilled nursing placement and will be at home alone without supervision and care.  We will have to consult the transitional care team to assist with this placement.  She will be changed to a full inpatient admission as a result of this.

## 2020-11-22 NOTE — Discharge Instructions (Signed)

## 2020-11-22 NOTE — Plan of Care (Signed)
  Problem: Pain Management: Goal: Pain level will decrease with appropriate interventions Outcome: Progressing   

## 2020-11-22 NOTE — Progress Notes (Signed)
Physical Therapy Treatment Patient Details Name: [name] MRN: [id number] DOB: 18-Dec-1932 Today's Date: 11/22/2020    History of Present Illness Patient is 86 y.o. female s/p Lt THA anterior approach on 11/21/20 with PMH significant for OA, vertigo, PAD, CAD, CHF, DM, HTN, GERD, HLD, NSTEMI (2016), Rt THA (2019), CABG (98).    PT Comments    Pt feels she is in too much pain to attempt amb at this time. She just got up to chair. Agreeable to exercises. Will see again later today. Pt reports she is planning to go to rehab post acute   Follow Up Recommendations  SNF     Equipment Recommendations  Rolling walker with 5" wheels (youth walker)    Recommendations for Other Services       Precautions / Restrictions Precautions Precautions: Fall Restrictions Weight Bearing Restrictions: No Other Position/Activity Restrictions: WBAT    Mobility  Bed Mobility Overal bed mobility: Needs Assistance Bed Mobility: Supine to Sit     Supine to sit: Min assist;HOB elevated;Mod assist     General bed mobility comments: min assist to walk Bil LE's towards EOB and mod assist to raise trunk and fully pivot with use of bed pad and pt using bed rail.  Transfers                    Ambulation/Gait                 Stairs             Wheelchair Mobility    Modified Rankin (Stroke Patients Only)       Balance                                            Cognition Arousal/Alertness: Awake/alert Behavior During Therapy: WFL for tasks assessed/performed Overall Cognitive Status: Within Functional Limits for tasks assessed                                        Exercises Total Joint Exercises Ankle Circles/Pumps: AROM;Both;20 reps;Seated Quad Sets: AROM;Both;10 reps Gluteal Sets: AROM;Both;10 reps Heel Slides: AAROM;Left;15 reps Hip ABduction/ADduction: AAROM;Left;10 reps    General Comments        Pertinent  Vitals/Pain Pain Assessment: 0-10 Pain Score: 6  Pain Location: Lt hip Pain Descriptors / Indicators: Aching;Discomfort Pain Intervention(s): Limited activity within patient's tolerance;Monitored during session;Premedicated before session;Repositioned    Home Living                      Prior Function            PT Goals (current goals can now be found in the care plan section) Acute Rehab PT Goals Patient Stated Goal: move better and got to rehab before home PT Goal Formulation: With patient Time For Goal Achievement: 11/28/20 Potential to Achieve Goals: Good    Frequency    7X/week      PT Plan Current plan remains appropriate    Co-evaluation              AM-PAC PT "6 Clicks" Mobility   Outcome Measure  Help needed turning from your back to your side while in a flat bed without using bedrails?: A Little Help needed moving from  lying on your back to sitting on the side of a flat bed without using bedrails?: A Little Help needed moving to and from a bed to a chair (including a wheelchair)?: A Little Help needed standing up from a chair using your arms (e.g., wheelchair or bedside chair)?: A Little Help needed to walk in hospital room?: A Lot Help needed climbing 3-5 steps with a railing? : A Lot 6 Click Score: 16    End of Session   Activity Tolerance: Patient tolerated treatment well Patient left: in chair;with call bell/phone within reach;with chair alarm set Nurse Communication: Mobility status PT Visit Diagnosis: Muscle weakness (generalized) (M62.81);Difficulty in walking, not elsewhere classified (R26.2)     Time: 4784-1282 PT Time Calculation (min) (ACUTE ONLY): 10 min  Charges:  $Therapeutic Exercise: 8-22 mins                     Baxter Flattery, PT  Acute Rehab Dept [facility]) [phone] Pager [phone]  11/22/2020    [facility] 11/22/2020, 10:53 AM

## 2020-11-23 LAB — CBC
HCT: 30.1 % — ABNORMAL LOW (ref 36.0–46.0)
Hemoglobin: 9.3 g/dL — ABNORMAL LOW (ref 12.0–15.0)
MCH: 28.4 pg (ref 26.0–34.0)
MCHC: 30.9 g/dL (ref 30.0–36.0)
MCV: 92 fL (ref 80.0–100.0)
Platelets: 187 10*3/uL (ref 150–400)
RBC: 3.27 MIL/uL — ABNORMAL LOW (ref 3.87–5.11)
RDW: 13.7 % (ref 11.5–15.5)
WBC: 7.1 10*3/uL (ref 4.0–10.5)
nRBC: 0 % (ref 0.0–0.2)

## 2020-11-23 LAB — SARS CORONAVIRUS 2 BY RT PCR (HOSPITAL ORDER, PERFORMED IN ~~LOC~~ HOSPITAL LAB): SARS Coronavirus 2: NEGATIVE

## 2020-11-23 NOTE — Progress Notes (Signed)
Physical Therapy Treatment Patient Details Name: [name] MRN: [id number] DOB: 1933-06-06 Today's Date: 11/23/2020    History of Present Illness Patient is 86 y.o. female s/p Lt THA anterior approach on 11/21/20 with PMH significant for OA, vertigo, PAD, CAD, CHF, DM, HTN, GERD, HLD, NSTEMI (2016), Rt THA (2019), CABG (98).    PT Comments    Pt progressing with mobility. Continues to require light min assist for transfers and cues for safety awareness. Continue to recommend STSNF post acute    Follow Up Recommendations  SNF     Equipment Recommendations  Rolling walker with 5" wheels (youth walker)    Recommendations for Other Services       Precautions / Restrictions Precautions Precautions: Fall Restrictions Other Position/Activity Restrictions: WBAT    Mobility  Bed Mobility Overal bed mobility: Needs Assistance Bed Mobility: Sit to Supine       Sit to supine: Min assist   General bed mobility comments: assist to lift LLE on to bed, incr time  Transfers Overall transfer level: Needs assistance Equipment used: Rolling walker (2 wheeled) Transfers: Sit to/from Stand Sit to Stand: Min assist         General transfer comment: cues for hand placement, and min assist for power up  Ambulation/Gait Ambulation/Gait assistance: Min assist;Min guard Gait Distance (Feet): 60 Feet Assistive device: Rolling walker (2 wheeled) Gait Pattern/deviations: Step-to pattern;Decreased stance time - left Gait velocity: decr   General Gait Details: cues for proximity to RW and step pattern. no overt LOB (deferred d/t  pt wit hc/o mild dizziness, weakness)   Stairs             Wheelchair Mobility    Modified Rankin (Stroke Patients Only)       Balance   Sitting-balance support: Feet supported Sitting balance-Leahy Scale: Good       Standing balance-Leahy Scale: Poor Standing balance comment: reliant on UEs                             Cognition Arousal/Alertness: Awake/alert Behavior During Therapy: WFL for tasks assessed/performed Overall Cognitive Status: Within Functional Limits for tasks assessed                                        Exercises Total Joint Exercises Ankle Circles/Pumps: AROM;Both;20 reps;Seated    General Comments        Pertinent Vitals/Pain Pain Assessment: 0-10 Pain Score: 3  Pain Location: Lt hip Pain Descriptors / Indicators: Aching;Discomfort Pain Intervention(s): Limited activity within patient's tolerance;Monitored during session;Premedicated before session;Repositioned;Ice applied;RN gave pain meds during session    Home Living                      Prior Function            PT Goals (current goals can now be found in the care plan section) Acute Rehab PT Goals Patient Stated Goal: move better and got to rehab before home PT Goal Formulation: With patient Time For Goal Achievement: 11/28/20 Potential to Achieve Goals: Good Progress towards PT goals: Progressing toward goals    Frequency    7X/week      PT Plan Current plan remains appropriate    Co-evaluation              AM-PAC PT "6 Clicks"  Mobility   Outcome Measure  Help needed turning from your back to your side while in a flat bed without using bedrails?: A Little Help needed moving from lying on your back to sitting on the side of a flat bed without using bedrails?: A Little Help needed moving to and from a bed to a chair (including a wheelchair)?: A Little Help needed standing up from a chair using your arms (e.g., wheelchair or bedside chair)?: A Little Help needed to walk in hospital room?: A Lot Help needed climbing 3-5 steps with a railing? : A Lot 6 Click Score: 16    End of Session Equipment Utilized During Treatment: Gait belt Activity Tolerance: Patient tolerated treatment well Patient left: with call bell/phone within reach;in bed;with bed alarm set Nurse  Communication: Mobility status PT Visit Diagnosis: Muscle weakness (generalized) (M62.81);Difficulty in walking, not elsewhere classified (R26.2)     Time: 4707-6151 PT Time Calculation (min) (ACUTE ONLY): 23 min  Charges:  $Gait Training: 23-37 mins                     Baxter Flattery, PT  Acute Rehab Dept ([place]) [phone] Pager [phone]  11/23/2020    [facility] 11/23/2020, 4:23 PM

## 2020-11-23 NOTE — Progress Notes (Signed)
Physical Therapy Treatment Patient Details Name: [name] MRN: [id number] DOB: 11/02/1933 Today's Date: 11/23/2020    History of Present Illness Patient is 86 y.o. female s/p Lt THA anterior approach on 11/21/20 with PMH significant for OA, vertigo, PAD, CAD, CHF, DM, HTN, GERD, HLD, NSTEMI (2016), Rt THA (2019), CABG (98).    PT Comments    Pt is pleasant and cooperative. Continues to progress although somewhat slowly. Pt states she feels "weak" today, more sore but agreeable to mobilizing.  Pivotal steps to chair with min assist and incr time. Reviewed THA HEP, improving AROM/strength LLE. Continue to follow  Follow Up Recommendations  SNF     Equipment Recommendations  Rolling walker with 5" wheels (youth walker)    Recommendations for Other Services       Precautions / Restrictions Precautions Precautions: Fall Restrictions Weight Bearing Restrictions: No Other Position/Activity Restrictions: WBAT    Mobility  Bed Mobility Overal bed mobility: Needs Assistance Bed Mobility: Supine to Sit     Supine to sit: Min assist;HOB elevated;Min guard     General bed mobility comments: min assist to walk Bil LE's towards EOB, incr time  Transfers Overall transfer level: Needs assistance Equipment used: Rolling walker (2 wheeled) Transfers: Sit to/from [facility] Sit to Stand: Min assist Stand pivot transfers: Min assist       General transfer comment: cues for hand placement on RW, and min assist for power up from EOB. min assist for balance and to maneuver RW. pt slighlty dizzy with initial standing.  Ambulation/Gait             General Gait Details:  (deferred d/t  pt wit hc/o mild dizziness, weakness)   Stairs             Wheelchair Mobility    Modified Rankin (Stroke Patients Only)       Balance             Standing balance-Leahy Scale: Poor Standing balance comment: reliant on UEs                             Cognition Arousal/Alertness: Awake/alert Behavior During Therapy: WFL for tasks assessed/performed Overall Cognitive Status: Within Functional Limits for tasks assessed                                        Exercises Total Joint Exercises Ankle Circles/Pumps: AROM;Both;20 reps;Seated Quad Sets: AROM;Both;10 reps Gluteal Sets: AROM;Both;10 reps Short Arc Quad: AROM;Left;10 reps Heel Slides: AAROM;Left;15 reps Hip ABduction/ADduction: AAROM;Left;10 reps    General Comments        Pertinent Vitals/Pain Pain Assessment: 0-10 Pain Score: 4  Pain Location: Lt hip Pain Descriptors / Indicators: Aching;Discomfort Pain Intervention(s): Limited activity within patient's tolerance;Monitored during session;Premedicated before session;Repositioned;Ice applied    Home Living                      Prior Function            PT Goals (current goals can now be found in the care plan section) Acute Rehab PT Goals Patient Stated Goal: move better and got to rehab before home PT Goal Formulation: With patient Time For Goal Achievement: 11/28/20 Potential to Achieve Goals: Good Progress towards PT goals: Progressing toward goals    Frequency  7X/week      PT Plan Current plan remains appropriate    Co-evaluation              AM-PAC PT "6 Clicks" Mobility   Outcome Measure  Help needed turning from your back to your side while in a flat bed without using bedrails?: A Little Help needed moving from lying on your back to sitting on the side of a flat bed without using bedrails?: A Little Help needed moving to and from a bed to a chair (including a wheelchair)?: A Little Help needed standing up from a chair using your arms (e.g., wheelchair or bedside chair)?: A Little Help needed to walk in hospital room?: A Lot Help needed climbing 3-5 steps with a railing? : A Lot 6 Click Score: 16    End of Session Equipment Utilized During  Treatment: Gait belt Activity Tolerance: Patient tolerated treatment well Patient left: in chair;with call bell/phone within reach;with chair alarm set Nurse Communication: Mobility status PT Visit Diagnosis: Muscle weakness (generalized) (M62.81);Difficulty in walking, not elsewhere classified (R26.2)     Time: 3329-5188 PT Time Calculation (min) (ACUTE ONLY): 17 min  Charges:  $Therapeutic Exercise: 8-22 mins                     Baxter Flattery, PT  Acute Rehab Dept [facility]) [phone] Pager [phone]  11/23/2020    [facility] 11/23/2020, 11:20 AM

## 2020-11-23 NOTE — Progress Notes (Signed)
Patient ID: [name], female   DOB: [birth year], 86 y.o.   MRN: [id number] There is been no acute changes in the patient's medical status.  Her left operative hip is stable.  Our plan is to have her proceed to skilled nursing tomorrow for short stay.

## 2020-11-23 NOTE — Plan of Care (Signed)
  Problem: Clinical Measurements: Goal: Ability to maintain clinical measurements within normal limits will improve Outcome: Progressing   Problem: Clinical Measurements: Goal: Will remain free from infection Outcome: Progressing   Problem: Clinical Measurements: Goal: Diagnostic test results will improve Outcome: Progressing   Problem: Clinical Measurements: Goal: Respiratory complications will improve Outcome: Progressing   

## 2020-11-24 ENCOUNTER — Encounter (HOSPITAL_COMMUNITY): Payer: Self-pay | Admitting: Orthopaedic Surgery

## 2020-11-24 DIAGNOSIS — M2550 Pain in unspecified joint: Secondary | ICD-10-CM | POA: Diagnosis not present

## 2020-11-24 DIAGNOSIS — E119 Type 2 diabetes mellitus without complications: Secondary | ICD-10-CM | POA: Diagnosis not present

## 2020-11-24 DIAGNOSIS — I5032 Chronic diastolic (congestive) heart failure: Secondary | ICD-10-CM | POA: Diagnosis not present

## 2020-11-24 DIAGNOSIS — E785 Hyperlipidemia, unspecified: Secondary | ICD-10-CM | POA: Diagnosis not present

## 2020-11-24 DIAGNOSIS — Z96642 Presence of left artificial hip joint: Secondary | ICD-10-CM | POA: Diagnosis not present

## 2020-11-24 DIAGNOSIS — R531 Weakness: Secondary | ICD-10-CM | POA: Diagnosis not present

## 2020-11-24 DIAGNOSIS — I252 Old myocardial infarction: Secondary | ICD-10-CM | POA: Diagnosis not present

## 2020-11-24 DIAGNOSIS — Z471 Aftercare following joint replacement surgery: Secondary | ICD-10-CM | POA: Diagnosis not present

## 2020-11-24 DIAGNOSIS — M1612 Unilateral primary osteoarthritis, left hip: Secondary | ICD-10-CM | POA: Diagnosis not present

## 2020-11-24 DIAGNOSIS — R6889 Other general symptoms and signs: Secondary | ICD-10-CM | POA: Diagnosis not present

## 2020-11-24 DIAGNOSIS — Z8673 Personal history of transient ischemic attack (TIA), and cerebral infarction without residual deficits: Secondary | ICD-10-CM | POA: Diagnosis not present

## 2020-11-24 DIAGNOSIS — Z951 Presence of aortocoronary bypass graft: Secondary | ICD-10-CM | POA: Diagnosis not present

## 2020-11-24 DIAGNOSIS — Z743 Need for continuous supervision: Secondary | ICD-10-CM | POA: Diagnosis not present

## 2020-11-24 DIAGNOSIS — M069 Rheumatoid arthritis, unspecified: Secondary | ICD-10-CM | POA: Diagnosis not present

## 2020-11-24 DIAGNOSIS — I2510 Atherosclerotic heart disease of native coronary artery without angina pectoris: Secondary | ICD-10-CM | POA: Diagnosis not present

## 2020-11-24 DIAGNOSIS — Z7401 Bed confinement status: Secondary | ICD-10-CM | POA: Diagnosis not present

## 2020-11-24 DIAGNOSIS — I110 Hypertensive heart disease with heart failure: Secondary | ICD-10-CM | POA: Diagnosis not present

## 2020-11-24 DIAGNOSIS — K219 Gastro-esophageal reflux disease without esophagitis: Secondary | ICD-10-CM | POA: Diagnosis not present

## 2020-11-24 LAB — CBC
HCT: 28 % — ABNORMAL LOW (ref 36.0–46.0)
Hemoglobin: 8.5 g/dL — ABNORMAL LOW (ref 12.0–15.0)
MCH: 28.3 pg (ref 26.0–34.0)
MCHC: 30.4 g/dL (ref 30.0–36.0)
MCV: 93.3 fL (ref 80.0–100.0)
Platelets: 183 10*3/uL (ref 150–400)
RBC: 3 MIL/uL — ABNORMAL LOW (ref 3.87–5.11)
RDW: 14 % (ref 11.5–15.5)
WBC: 7.7 10*3/uL (ref 4.0–10.5)
nRBC: 0 % (ref 0.0–0.2)

## 2020-11-24 MED ORDER — ASPIRIN 81 MG PO CHEW: 81.0000 mg | CHEWABLE_TABLET | Freq: Every day | ORAL | 0 refills | Status: DC

## 2020-11-24 MED ORDER — HYDROCODONE-ACETAMINOPHEN 5-325 MG PO TABS: 1.0000 | ORAL_TABLET | ORAL | 0 refills | Status: DC | PRN

## 2020-11-24 NOTE — Progress Notes (Signed)
Patient ID: [name], female   DOB: 12-23-1932, 86 y.o.   MRN: [id number] She is doing well overall.  Her vital signs are stable.  Her labs is stabilized.  I changed her left hip dressing and it is clean and dry.  She can be discharged to skilled nursing today.

## 2020-11-24 NOTE — Plan of Care (Signed)
°  Problem: Health Behavior/Discharge Planning: Goal: Ability to manage health-related needs will improve Outcome: Progressing   Problem: Clinical Measurements: Goal: Ability to maintain clinical measurements within normal limits will improve Outcome: Progressing Goal: Will remain free from infection Outcome: Progressing Goal: Diagnostic test results will improve Outcome: Progressing Goal: Respiratory complications will improve Outcome: Progressing Goal: Cardiovascular complication will be avoided Outcome: Progressing   Problem: Activity: Goal: Risk for activity intolerance will decrease Outcome: Progressing   Problem: Nutrition: Goal: Adequate nutrition will be maintained Outcome: Progressing   Problem: Elimination: Goal: Will not experience complications related to bowel motility Outcome: Progressing   Problem: Elimination: Goal: Will not experience complications related to bowel motility Outcome: Progressing Goal: Will not experience complications related to urinary retention Outcome: Progressing   Problem: Pain Managment: Goal: General experience of comfort will improve Outcome: Progressing   Problem: Safety: Goal: Ability to remain free from injury will improve Outcome: Progressing   Problem: Skin Integrity: Goal: Risk for impaired skin integrity will decrease Outcome: Progressing   Problem: Education: Goal: Knowledge of the prescribed therapeutic regimen will improve Outcome: Progressing Goal: Understanding of discharge needs will improve Outcome: Progressing   Problem: Activity: Goal: Ability to avoid complications of mobility impairment will improve Outcome: Progressing Goal: Ability to tolerate increased activity will improve Outcome: Progressing   Problem: Clinical Measurements: Goal: Postoperative complications will be avoided or minimized Outcome: Progressing   Problem: Pain Management: Goal: Pain level will decrease with appropriate  interventions Outcome: Progressing   Problem: Skin Integrity: Goal: Will show signs of wound healing Outcome: Progressing

## 2020-11-24 NOTE — Plan of Care (Signed)
  Problem: Health Behavior/Discharge Planning: Goal: Ability to manage health-related needs will improve Outcome: Progressing   Problem: Clinical Measurements: Goal: Ability to maintain clinical measurements within normal limits will improve Outcome: Progressing Goal: Will remain free from infection Outcome: Progressing Goal: Diagnostic test results will improve Outcome: Progressing Goal: Respiratory complications will improve Outcome: Progressing Goal: Cardiovascular complication will be avoided Outcome: Progressing   Problem: Activity: Goal: Risk for activity intolerance will decrease Outcome: Progressing   Problem: Nutrition: Goal: Adequate nutrition will be maintained Outcome: Progressing   Problem: Elimination: Goal: Will not experience complications related to bowel motility Outcome: Progressing Goal: Will not experience complications related to urinary retention Outcome: Progressing   Problem: Pain Managment: Goal: General experience of comfort will improve Outcome: Progressing   Problem: Safety: Goal: Ability to remain free from injury will improve Outcome: Progressing   Problem: Skin Integrity: Goal: Risk for impaired skin integrity will decrease Outcome: Progressing   Problem: Education: Goal: Knowledge of the prescribed therapeutic regimen will improve Outcome: Progressing Goal: Understanding of discharge needs will improve Outcome: Progressing   Problem: Activity: Goal: Ability to avoid complications of mobility impairment will improve Outcome: Progressing Goal: Ability to tolerate increased activity will improve Outcome: Progressing   Problem: Clinical Measurements: Goal: Postoperative complications will be avoided or minimized Outcome: Progressing   Problem: Skin Integrity: Goal: Will show signs of wound healing Outcome: Progressing   Problem: Pain Management: Goal: Pain level will decrease with appropriate interventions Outcome:  Progressing

## 2020-11-24 NOTE — Discharge Summary (Signed)
Patient ID: [name] MRN: [id number] DOB/AGE: 1933-09-15 86 y.o.  Admit date: 11/21/2020 Discharge date: 11/24/2020  Admission Diagnoses:  Principal Problem:   Unilateral primary osteoarthritis, left hip Active Problems:   Status post total replacement of left hip   Discharge Diagnoses:  Same  Past Medical History:  Diagnosis Date  . Allergy to IVP dye   . Angina, class I ([place]) 04/22/2015  . Atherosclerotic heart disease native coronary artery w/angina pectoris ([place]) 1992   a. 1992 s/p PTCA of LAD;  b. 1998 CABG x 3; c. 07/2001 Cath: Sev LM/LAD/LCX dzs, 3/3 patent grafts; d. 11/2013 Cath: stable graft anatomy; e. 10/2016 Cath: native 3VD, VG->OM nl, LIMA->LAD nl, VG->Diag 55.  Marland Kitchen Atherosclerotic heart disease of native coronary artery with angina pectoris ([place]) 01/14/2010   Qualifier: Diagnosis of  By: Deatra Ina MD, Sandy Salaam   . Atrial septal aneurysm   . Barrett's esophagus 03/04/2010   Qualifier: Diagnosis of  By: Trellis Paganini PA-c, Amy S   . Bilateral leg edema 04/22/2015  . Bradycardia, mild briefly to the 40s, asymptomatic 05/16/2013  . Carotid arterial disease ([place])    a. 05/2014 Carotid U/S: No signif bilat ICA stenosis, >60% L ECA.  Marland Kitchen Chronic anemia   . Chronic diastolic CHF (congestive heart failure) ([place])    a. 10/2016 Echo: Ef 55-60%, no rwma, Gr1 DD, triv AI, PASP 54mmHg, atrial septal aneurysm.  . Chronic diastolic CHF (congestive heart failure), NYHA class 2 ([place]) 08/11/2017  . Claudication ([place]) 05/16/2013   Peripheral arterial occlusive disease with lifestyle limiting claudication   . Degenerative joint disease 05/30/2018  . DIVERTICULOSIS OF COLON 01/14/2010   Qualifier: Diagnosis of  By: Deatra Ina MD, Sandy Salaam   . DM (diabetes mellitus), type 2 with peripheral vascular complications ([place])    On Oral medications.  . DOE (dyspnea on exertion) 04/22/2015  . Essential hypertension   . FECAL INCONTINENCE 03/04/2010   Qualifier: Diagnosis of  By: Trellis Paganini PA-c, Amy S   . GERD  (gastroesophageal reflux disease)   . Hyperlipidemia with target LDL less than 70   . Inflammatory arthritis 10/13/2017  . Non-insulin dependent type 2 diabetes mellitus ([place]) 10/24/2015  . NSTEMI (non-ST elevated myocardial infarction) ([place]) 11/01/2016  . PAD (peripheral artery disease) ([place]) 05/2013; 09/2013   a. severe calcified POP-1 & TP Trunk Dz bilat;  b . 05/2013 [place] Rot Athrectomy (R POP) --> PTA R Pop, DES to R Peroneal;  c. 09/2013 PTA/Stenting of L Pop [name] - retrograde access);  d. 04/2014 ABI's: R/L: 1.1/1.1.  Marland Kitchen Preoperative cardiovascular examination 04/15/2020  . PUD (peptic ulcer disease)   . S/P CABG x 3 1998   LIMA-LAD, SVG-OM, SVG-D1  . Severe claudication ([place]) 05/2013   Referred for Peripheral Angio (Dr. Gwenlyn Found --> Dr. Brunetta Jeans in [place])  . [place] ALLERGY 01/14/2010   Qualifier: Diagnosis of  By: Nelson-Smith CMA (AAMA), [name]    . Status post total replacement of right hip 10/12/2018  . Vertigo 01/02/2014    Surgeries: Procedure(s): LEFT TOTAL HIP ARTHROPLASTY ANTERIOR APPROACH on 11/21/2020   Consultants:   Discharged Condition: Improved  Hospital Course: [name] is an 86 y.o. female who was admitted 11/21/2020 for operative treatment ofUnilateral primary osteoarthritis, left hip. Patient has severe unremitting pain that affects sleep, daily activities, and work/hobbies. After pre-op clearance the patient was taken to the operating room on 11/21/2020 and underwent  Procedure(s): LEFT TOTAL HIP ARTHROPLASTY ANTERIOR APPROACH.    Patient was given perioperative  antibiotics:  Anti-infectives (From admission, onward)   Start     Dose/Rate Route Frequency Ordered Stop   11/21/20 1430  ceFAZolin (ANCEF) IVPB 1 g/50 mL premix        1 g 100 mL/hr over 30 Minutes Intravenous Every 6 hours 11/21/20 1233 11/21/20 2234   11/21/20 0645  ceFAZolin (ANCEF) IVPB 2g/100 mL premix        2 g 200 mL/hr over 30 Minutes Intravenous On call to O.R. 11/21/20 1610  11/21/20 9604       Patient was given sequential compression devices, early ambulation, and chemoprophylaxis to prevent DVT.  Patient benefited maximally from hospital stay and there were no complications.    Recent vital signs:  Patient Vitals for the past 24 hrs:  BP Temp Temp src Pulse Resp SpO2  11/24/20 0512 (!) 127/51 97.9 F (36.6 C) -- 60 16 97 %  11/23/20 2106 (!) 150/60 98.3 F (36.8 C) Oral 63 18 98 %  11/23/20 1326 (!) 116/53 98.8 F (37.1 C) Oral (!) 58 17 97 %     Recent laboratory studies:  Recent Labs    11/22/20 0314 11/23/20 0322 11/24/20 0309  WBC 6.2 7.1 7.7  HGB 8.9* 9.3* 8.5*  HCT 29.1* 30.1* 28.0*  PLT 192 187 183  NA 139  --   --   K 3.8  --   --   CL 103  --   --   CO2 28  --   --   BUN 13  --   --   CREATININE 0.88  --   --   GLUCOSE 94  --   --   CALCIUM 8.1*  --   --      Discharge Medications:   Allergies as of 11/24/2020      Reactions   Fish Allergy Hives, Shortness Of Breath   Ivp Dye [iodinated Diagnostic Agents] Shortness Of Breath, Anaphylaxis   Latex Anaphylaxis, Hives, Shortness Of Breath, Itching, Other (See Comments)   REACTION: wheezing   Shellfish Allergy Anaphylaxis, Hives, Shortness Of Breath, Other (See Comments)   All seafood   Other Itching, Other (See Comments)   Patient reports allergy to perfumed detergents. Causes itching.   Metformin Nausea Only, Nausea And Vomiting      Medication List    STOP taking these medications   acetaminophen-codeine 300-30 MG tablet Commonly known as: TYLENOL #3     TAKE these medications   acetaminophen 650 MG CR tablet Commonly known as: TYLENOL Take 650 mg by mouth every 8 (eight) hours as needed for pain.   aspirin 81 MG chewable tablet Chew 1 tablet (81 mg total) by mouth daily.   carvedilol 6.25 MG tablet Commonly known as: COREG Take 1 tablet (6.25 mg total) by mouth 2 (two) times daily.   clopidogrel 75 MG tablet Commonly known as: PLAVIX Take 75 mg by mouth  daily.   CO Q 10 PO Take 1 tablet by mouth every other day.   ezetimibe 10 MG tablet Commonly known as: ZETIA Take 10 mg by mouth at bedtime.   FLAXSEED OIL PO Take 1 capsule by mouth daily.   folic acid 1 MG tablet Commonly known as: FOLVITE Take 1 mg by mouth daily.   furosemide 40 MG tablet Commonly known as: LASIX Take 40 Mg (1 tablet) daily   gabapentin 400 MG capsule Commonly known as: NEURONTIN Take 400 mg by mouth 3 (three) times daily.   glimepiride 1 MG tablet Commonly  known as: AMARYL Take 3 mg by mouth daily with breakfast.   HYDROcodone-acetaminophen 5-325 MG tablet Commonly known as: NORCO/VICODIN Take 1-2 tablets by mouth every 4 (four) hours as needed for moderate pain (pain score 4-6).   isosorbide mononitrate 60 MG 24 hr tablet Commonly known as: IMDUR TAKE 1.5 TABLETS (90 MG TOTAL) BY MOUTH DAILY. Marland Kitchen   LORazepam 0.5 MG tablet Commonly known as: ATIVAN Take 0.5 mg by mouth every 6 (six) hours as needed for anxiety or sleep.   methocarbamol 500 MG tablet Commonly known as: ROBAXIN Take 1 tablet (500 mg total) by mouth every 8 (eight) hours as needed for muscle spasms.   methotrexate 2.5 MG tablet Commonly known as: RHEUMATREX Take 20 mg by mouth every Friday. (8 tablets)   nitroGLYCERIN 0.4 MG SL tablet Commonly known as: NITROSTAT PLACE 1 TABLET (0.4 MG TOTAL) UNDER THE TONGUE EVERY 5 (FIVE) MINUTES AS NEEDED FOR CHEST PAIN.   pantoprazole 40 MG tablet Commonly known as: PROTONIX Take 1 tablet (40 mg total) by mouth 2 (two) times daily before a meal.   predniSONE 5 MG tablet Commonly known as: DELTASONE Take 5 mg by mouth daily with breakfast.   rosuvastatin 40 MG tablet Commonly known as: CRESTOR Take 1 tablet (40 mg total) by mouth at bedtime.   sulfaSALAzine 500 MG EC tablet Commonly known as: AZULFIDINE Take 500 mg by mouth 2 (two) times daily.   traMADol 50 MG tablet Commonly known as: ULTRAM Take 1-2 tablets (50-100 mg  total) by mouth every 6 (six) hours as needed. What changed: reasons to take this   Vitamin D3 10 MCG (400 UNIT) tablet Take 400 Units by mouth daily.            Durable Medical Equipment  (From admission, onward)         Start     Ordered   11/21/20 1234  DME 3 n 1  Once        11/21/20 1233   11/21/20 1234  DME Walker rolling  Once       Question Answer Comment  Walker: With 5 Inch Wheels   Patient needs a walker to treat with the following condition Status post total replacement of left hip      11/21/20 1233          Diagnostic Studies: DG Chest 1 View  Result Date: 11/17/2020 CLINICAL DATA:  Hypotension EXAM: CHEST  1 VIEW COMPARISON:  07/10/2020 chest radiograph and prior. FINDINGS: Hypoinflated lungs. No pneumothorax or pleural effusion. Hazy left basilar opacities, likely atelectasis versus prominent epicardial fat. Stable postsurgical appearance of the cardiomediastinal silhouette. Multilevel spondylosis and left glenohumeral osteoarthrosis. IMPRESSION: No acute airspace disease. Electronically Signed   By: Primitivo Gauze M.D.   On: 11/17/2020 15:39   DG Pelvis Portable  Result Date: 11/21/2020 CLINICAL DATA:  Left total hip arthroplasty. EXAM: PORTABLE PELVIS 1-2 VIEWS COMPARISON:  Intraop radiograph 11/21/2020 FINDINGS: Postoperative radiograph demonstrates left total hip arthroplasty. Gas present in the joint. No Peri prosthetic fracture is present. Pelvis is intact. Remote right hip arthroplasty with cerclage wire is noted. Vascular calcifications are present. IMPRESSION: Left total hip arthroplasty without radiographic evidence for complication. Electronically Signed   By: San Morelle M.D.   On: 11/21/2020 10:55   DG C-Arm 1-60 Min-No Report  Result Date: 11/21/2020 Fluoroscopy was utilized by the requesting physician.  No radiographic interpretation.   DG HIP OPERATIVE UNILAT W OR W/O PELVIS LEFT  Result Date: 11/21/2020  CLINICAL DATA:  Left  hip replacement. EXAM: OPERATIVE LEFT HIP (WITH PELVIS IF PERFORMED) 2 VIEWS TECHNIQUE: Fluoroscopic spot image(s) were submitted for interpretation post-operatively. COMPARISON:  03/06/2020 FINDINGS: Intraoperative images demonstrate prior right hip arthroplasty. A left hip arthroplasty has been placed. Left femur is mildly rotated on these two views. There is no evidence for a periprosthetic fracture. Left hip appears located on these images. IMPRESSION: Placement of left hip arthroplasty without complicating features. Electronically Signed   By: Markus Daft M.D.   On: 11/21/2020 09:53    Disposition: Discharge disposition: 03-Skilled [place]    Mcarthur Rossetti, MD. Go on 12/04/2020.   Specialty: Orthopedic Surgery Why: at 1:00 pm for your 2 week in office appointment Contact information: [address] [name] [address] [phone]                Signed: Mcarthur Rossetti 11/24/2020, 7:26 AM

## 2020-11-24 NOTE — TOC Transition Note (Signed)
Transition of Care [facility]) - CM/SW Discharge Note   Patient Details  Name: [name] MRN: [id number] Date of Birth: August 05, 1933  [facility] CM/SW Contact:  Lennart Pall, LCSW Phone Number: 11/24/2020, 11:55 AM   Clinical Narrative:    Pt has accepted SNF bed offer from [facility] and plan to dc today via PTAR.  Daughter aware.  No further TOC needs.   Final next level of care: Skilled Nursing Facility Barriers to Discharge: Barriers Resolved   Patient Goals and CMS Choice   CMS Medicare.gov Compare Post Acute Care list provided to:: Patient Choice offered to / list presented to : Patient  Discharge Placement              Patient chooses bed at: [facility] Patient to be transferred to facility by: [facility] Name of family member notified: daughter, Tedra [place] Patient and family notified of of transfer: 11/24/20  Discharge Plan and Services In-house Referral: Clinical Social Work Discharge Planning Services: [employer] Consult Post Acute Care Choice: [facility]          DME Arranged:  (No DME needed at this time as patient verbalized she would like to go to a SNF for transitional rehab)                    Social Determinants of Health (SDOH) Interventions     Readmission Risk Interventions No flowsheet data found.

## 2020-11-24 NOTE — Progress Notes (Signed)
Chaplain engaged in initial visit with [facility].  During visit, Rhealyn shared theological questions and musings.  Latoria is apart of a local body in [place], which she joined when she married her husband.  She had questions around biblical interpretation and the many world events that have been happening recently.  She discussed the "end of times" and what God may be trying to convey.  Chaplain and Callyn also talked about the different translations that exist of the Bible and being able to understand the text.  Chaplain affirmed Alanna's questions and encouraged her to continue to ask those questions, study and find answers for herself.  Judeen noted how much she thinks about God and the word of God.  Shirel also shared that she lost her son recently.  She stated that he was suffering and had endured pneumonia and a stroke and it had been hard on his body.  She conveyed not wanting him to suffer any longer.  Chaplain offered ministries of listening and presence.  Chaplain could assess that Dariella had to come to terms with her son's death and recognized that he did not have a good quality of life before.   Annamaria noted that she was discharging today and chaplain offered blessings on her journey home.  Chaplain will follow-up as needed.    11/24/20 1200  Clinical Encounter Type  Visited With Patient  Visit Type Initial;Spiritual support

## 2020-11-24 NOTE — Progress Notes (Signed)
Physical Therapy Treatment Patient Details Name: [name] MRN: [id number] DOB: 05-17-1933 Today's Date: 11/24/2020    History of Present Illness Patient is 86 y.o. female s/p Lt THA anterior approach on 11/21/20 with PMH significant for OA, vertigo, PAD, CAD, CHF, DM, HTN, GERD, HLD, NSTEMI (2016), Rt THA (2019), CABG (98).    PT Comments    Pt progressing toward PT goals. incr activity/giat distance tolerance although requires rest breaks d/t fatigue. Continue recommend SNF post acute. Pt is pleasant and motivated to return to independence.   Follow Up Recommendations  SNF     Equipment Recommendations  Rolling walker with 5" wheels (youth ht)    Recommendations for Other Services       Precautions / Restrictions Precautions Precautions: Fall Restrictions Weight Bearing Restrictions: No Other Position/Activity Restrictions: WBAT    Mobility  Bed Mobility Overal bed mobility: Needs Assistance Bed Mobility: Supine to Sit     Supine to sit: Min guard;Supervision     General bed mobility comments: incr time, able to self assist LLE with UEs  Transfers Overall transfer level: Needs assistance Equipment used: Rolling walker (2 wheeled) Transfers: Sit to/from Stand Sit to Stand: Min guard         General transfer comment: cues for hand placement and control  of descent  Ambulation/Gait Ambulation/Gait assistance: Min guard Gait Distance (Feet): 90 Feet Assistive device: Rolling walker (2 wheeled) Gait Pattern/deviations: Step-to pattern;Step-through pattern;Decreased stride length Gait velocity: decr   General Gait Details: cues for proximity to RW and step pattern. no overt LOB, beginning step through gait. 3 brief standing rests   Marine scientist [place] (Stroke Patients Only)       Balance                                            Cognition Arousal/Alertness: Awake/alert Behavior  During Therapy: WFL for tasks assessed/performed Overall Cognitive Status: Within Functional Limits for tasks assessed                                        Exercises Total Joint Exercises Ankle Circles/Pumps: AROM;Both;10 reps Quad Sets: AROM;Both;10 reps Heel Slides: AAROM;Left;10 reps    General Comments        Pertinent Vitals/Pain Pain Assessment: 0-10 Pain Score: 3  Pain Location: Lt hip Pain Descriptors / Indicators: Discomfort;Sore Pain Intervention(s): Limited activity within patient's tolerance;Monitored during session;Premedicated before session;Repositioned    Home Living                      Prior Function            PT Goals (current goals can now be found in the care plan section) Acute Rehab PT Goals Patient Stated Goal: move better and got to rehab before home PT Goal Formulation: With patient Time For Goal Achievement: 11/28/20 Potential to Achieve Goals: Good Progress towards PT goals: Progressing toward goals    Frequency    7X/week      PT Plan Current plan remains appropriate    Co-evaluation              AM-PAC PT "6 Clicks" Mobility  Outcome Measure  Help needed turning from your back to your side while in a flat bed without using bedrails?: A Little Help needed moving from lying on your back to sitting on the side of a flat bed without using bedrails?: A Little Help needed moving to and from a bed to a chair (including a wheelchair)?: A Little Help needed standing up from a chair using your arms (e.g., wheelchair or bedside chair)?: A Little Help needed to walk in hospital room?: A Little Help needed climbing 3-5 steps with a railing? : A Little 6 Click Score: 18    End of Session Equipment Utilized During Treatment: Gait belt Activity Tolerance: Patient tolerated treatment well Patient left: in chair;with call bell/phone within reach;with chair alarm set Nurse Communication: Mobility status PT  Visit Diagnosis: Muscle weakness (generalized) (M62.81);Difficulty in walking, not elsewhere classified (R26.2)     Time: 8206-0156 PT Time Calculation (min) (ACUTE ONLY): 28 min  Charges:  $Gait Training: 23-37 mins                     Baxter Flattery, PT  Acute Rehab Dept ([place]) [phone] Pager [phone]  11/24/2020    [facility] 11/24/2020, 11:19 AM

## 2020-11-26 DIAGNOSIS — I2510 Atherosclerotic heart disease of native coronary artery without angina pectoris: Secondary | ICD-10-CM | POA: Diagnosis not present

## 2020-11-26 DIAGNOSIS — M1612 Unilateral primary osteoarthritis, left hip: Secondary | ICD-10-CM | POA: Diagnosis not present

## 2020-11-26 DIAGNOSIS — M069 Rheumatoid arthritis, unspecified: Secondary | ICD-10-CM | POA: Diagnosis not present

## 2020-11-26 DIAGNOSIS — Z471 Aftercare following joint replacement surgery: Secondary | ICD-10-CM | POA: Diagnosis not present

## 2020-11-26 DIAGNOSIS — Z96642 Presence of left artificial hip joint: Secondary | ICD-10-CM | POA: Diagnosis not present

## 2020-11-28 ENCOUNTER — Telehealth: Payer: Self-pay | Admitting: *Deleted

## 2020-11-28 NOTE — Telephone Encounter (Signed)
Ortho bundle d/C call completed on 11/25/20. Complication with documenting and corrected. All calls up to date.

## 2020-11-28 NOTE — Telephone Encounter (Signed)
7 day Ortho bundle call completed. 

## 2020-12-04 ENCOUNTER — Telehealth: Payer: Self-pay | Admitting: *Deleted

## 2020-12-04 ENCOUNTER — Encounter: Payer: Self-pay | Admitting: Orthopaedic Surgery

## 2020-12-04 ENCOUNTER — Ambulatory Visit (INDEPENDENT_AMBULATORY_CARE_PROVIDER_SITE_OTHER): Payer: Medicare Other | Admitting: Orthopaedic Surgery

## 2020-12-04 ENCOUNTER — Other Ambulatory Visit: Payer: Self-pay | Admitting: Orthopaedic Surgery

## 2020-12-04 DIAGNOSIS — Z96642 Presence of left artificial hip joint: Secondary | ICD-10-CM

## 2020-12-04 DIAGNOSIS — Z96641 Presence of right artificial hip joint: Secondary | ICD-10-CM

## 2020-12-04 MED ORDER — METHOCARBAMOL 500 MG PO TABS: 500.0000 mg | ORAL_TABLET | Freq: Three times a day (TID) | ORAL | 0 refills | Status: DC | PRN

## 2020-12-04 MED ORDER — TRAMADOL HCL 50 MG PO TABS: 50.0000 mg | ORAL_TABLET | Freq: Four times a day (QID) | ORAL | 0 refills | Status: DC | PRN

## 2020-12-04 NOTE — Progress Notes (Signed)
The patient comes in today 2 weeks status post a left total hip arthroplasty.  She has been staying in a nursing facility as she rehabs but has already been discharged to home today.  She has no issues.  She is back on her regular Plavix.  Examination of her left hip incision shows that it looks good.  I removed the staples and I did place Steri-Strips.  I counseled her about wound care and leaving these alone until they fall off on their own.  Her leg lengths are equal.  She is ambulate with a walker and seems to doing well.  She will transition to home therapy.  I will see her back in 4 weeks to see how she is doing overall.  All questions and concerns were answered and addressed.

## 2020-12-04 NOTE — Telephone Encounter (Signed)
14 day Ortho bundle call completed (in office visit). Doing well and having no issues. D/C from SNF to home today.

## 2020-12-11 ENCOUNTER — Inpatient Hospital Stay: Payer: Medicare Other | Admitting: Orthopaedic Surgery

## 2020-12-19 ENCOUNTER — Telehealth: Payer: Self-pay

## 2020-12-19 NOTE — Telephone Encounter (Signed)
Patient aware I have spoke to [facility] and they will be calling her to come out

## 2020-12-19 NOTE — Telephone Encounter (Signed)
Patient discharged from [facility] on 12-04-20.  She has not had any formal therapy since.  She wanted to make sure that she was not supposed to have any more therapy.  Please advise.

## 2020-12-23 ENCOUNTER — Telehealth: Payer: Self-pay | Admitting: *Deleted

## 2020-12-23 NOTE — Telephone Encounter (Signed)
Attempted 30 day Ortho bundle call to patient. No answer and no VM picked up. Will try again.

## 2021-01-01 ENCOUNTER — Telehealth: Payer: Self-pay | Admitting: *Deleted

## 2021-01-01 NOTE — Telephone Encounter (Signed)
Ortho bundle 30 day call completed. °

## 2021-01-07 ENCOUNTER — Ambulatory Visit (INDEPENDENT_AMBULATORY_CARE_PROVIDER_SITE_OTHER): Payer: Medicare Other | Admitting: Physician Assistant

## 2021-01-07 ENCOUNTER — Encounter: Payer: Self-pay | Admitting: Physician Assistant

## 2021-01-07 DIAGNOSIS — I5032 Chronic diastolic (congestive) heart failure: Secondary | ICD-10-CM | POA: Insufficient documentation

## 2021-01-07 DIAGNOSIS — Z96642 Presence of left artificial hip joint: Secondary | ICD-10-CM

## 2021-01-07 MED ORDER — TRAMADOL HCL 50 MG PO TABS: 50.0000 mg | ORAL_TABLET | Freq: Four times a day (QID) | ORAL | 0 refills | Status: DC | PRN

## 2021-01-07 MED ORDER — METHOCARBAMOL 500 MG PO TABS: 500.0000 mg | ORAL_TABLET | Freq: Three times a day (TID) | ORAL | 0 refills | Status: DC | PRN

## 2021-01-07 NOTE — Progress Notes (Signed)
                                                                         HPI: Returns today status post left total hip arthroplasty 11/21/2020.  She is using cane to ambulate.  She rates the numbness about the left hip incision region.  And some pain down the knee.  She overall feels that she is improving though.  She has had no fevers or chills.  She no longer on aspirin she is on chronic Plavix.  Physical exam: Left hip surgical incisions healed well no signs of infection.  Good range of motion left hip without significant pain.  Left calf supple nontender dorsiflexion plantarflexion ankle intact.  She ambulates with a cane with a nonantalgic gait.  Impression: Status post left total hip arthroplasty 11/21/2020  Plan: She will continue to work on range of motion strengthening of the hip.  Did discuss with her that the numbness can last for some time but is normal.  She is already on Neurontin therefore would not recommend any other medications for this.  We will see her back in a month to see how she is progressing.  Questions encouraged and answered.

## 2021-01-14 DIAGNOSIS — H43813 Vitreous degeneration, bilateral: Secondary | ICD-10-CM | POA: Diagnosis not present

## 2021-01-14 DIAGNOSIS — E119 Type 2 diabetes mellitus without complications: Secondary | ICD-10-CM | POA: Diagnosis not present

## 2021-01-14 DIAGNOSIS — H26493 Other secondary cataract, bilateral: Secondary | ICD-10-CM | POA: Diagnosis not present

## 2021-01-14 DIAGNOSIS — H52203 Unspecified astigmatism, bilateral: Secondary | ICD-10-CM | POA: Diagnosis not present

## 2021-01-16 ENCOUNTER — Other Ambulatory Visit: Payer: Self-pay | Admitting: Cardiovascular Disease

## 2021-01-17 ENCOUNTER — Other Ambulatory Visit: Payer: Self-pay | Admitting: Cardiovascular Disease

## 2021-01-19 ENCOUNTER — Telehealth: Payer: Self-pay | Admitting: Orthopaedic Surgery

## 2021-01-19 NOTE — Telephone Encounter (Signed)
[place] form mailed to patient

## 2021-01-27 ENCOUNTER — Encounter: Payer: Self-pay | Admitting: Gastroenterology

## 2021-02-04 ENCOUNTER — Encounter: Payer: Self-pay | Admitting: Orthopaedic Surgery

## 2021-02-04 ENCOUNTER — Ambulatory Visit (INDEPENDENT_AMBULATORY_CARE_PROVIDER_SITE_OTHER): Payer: Medicare Other | Admitting: Orthopaedic Surgery

## 2021-02-04 DIAGNOSIS — Z96642 Presence of left artificial hip joint: Secondary | ICD-10-CM

## 2021-02-04 NOTE — Progress Notes (Signed)
The patient comes in today 10 weeks status post a left total hip arthroplasty.  She is 87 years old.  She is ambulate with a cane.  She reports some stiffness but overall says she is doing well.  We replaced her right hip in 2019.  She does tell me that her daughter thinks that she should have been better by now but it has been less than 3 months and she is 5.  She looks good to me overall.  I put her left hip through internal extra rotation and move smoothly.  She is still having some stiffness when she gets up but overall looks like she is making good progress to be expected at this standpoint 10 weeks postop.  She will continue to increase her activities as comfort allows.  From my standpoint I do not need to see her back for 6 months unless she is having any issues.  At that visit I like just a standing AP pelvis.

## 2021-02-17 ENCOUNTER — Telehealth: Payer: Self-pay | Admitting: Orthopaedic Surgery

## 2021-02-17 NOTE — Telephone Encounter (Signed)
Received [place] forms from pt. Dr. Ninfa Linden signed and forms mailed in the envelope provided by patient addressed to [facility]

## 2021-02-20 ENCOUNTER — Ambulatory Visit: Payer: Medicare Other | Admitting: Podiatry

## 2021-03-02 ENCOUNTER — Ambulatory Visit (INDEPENDENT_AMBULATORY_CARE_PROVIDER_SITE_OTHER): Payer: Medicare Other | Admitting: Orthopaedic Surgery

## 2021-03-02 ENCOUNTER — Ambulatory Visit: Payer: Self-pay

## 2021-03-02 ENCOUNTER — Encounter: Payer: Self-pay | Admitting: Orthopaedic Surgery

## 2021-03-02 DIAGNOSIS — Z96642 Presence of left artificial hip joint: Secondary | ICD-10-CM | POA: Diagnosis not present

## 2021-03-02 NOTE — Progress Notes (Signed)
The patient is now just over 3 months status post a left total hip arthroplasty.  She is 87 years old and does ambulate with a cane.  We also replaced her right hip primarily.  She has been having some thigh pain that was bad about a week ago but now is subsiding.  She says she has a little bit of knee pain as well.  She is ambulating using a cane.  She looks good today and is walking with no limp.  She tolerates me easily putting her left more recent operative hip through rotation without any difficulty at all.  An AP pelvis shows a well-seated hip replacement bilaterally with no complicating features or acute findings.  I gave her reassurance that things were looking good thus far.  Hopefully her pain will continue to subside.  All questions and concerns were answered and addressed.  We can see her back in 3 months with an AP and lateral of the left hip.  Obviously if there is any issues before then she will let us know.

## 2021-03-03 ENCOUNTER — Telehealth: Payer: Self-pay | Admitting: *Deleted

## 2021-03-03 NOTE — Care Plan (Signed)
(  Late entry for 03/02/21). Met with patient and Dr. Ninfa Linden during her in office appointment. Patient doing well after left total hip replacement with exception of having some increased pain in the thigh and around the knee. X-rays done today show no complications to the hip. MD would like to see her back in 3 months.

## 2021-03-03 NOTE — Telephone Encounter (Signed)
Ortho bundle 90 day call completed. 

## 2021-03-20 ENCOUNTER — Other Ambulatory Visit: Payer: Self-pay | Admitting: Orthopaedic Surgery

## 2021-03-20 ENCOUNTER — Telehealth: Payer: Self-pay

## 2021-03-20 ENCOUNTER — Telehealth: Payer: Self-pay | Admitting: Orthopaedic Surgery

## 2021-03-20 MED ORDER — ACETAMINOPHEN-CODEINE #3 300-30 MG PO TABS: 1.0000 | ORAL_TABLET | Freq: Three times a day (TID) | ORAL | 0 refills | Status: DC | PRN

## 2021-03-20 NOTE — Telephone Encounter (Signed)
Pt called stating she had surgery in December and from her hip to her knee it very tender, swollen and hot. Pt would like too have something called in to help with the pain, but in the mean time spoke with a triage nurse to see how she might be able to get rid of the pain.   [phone]

## 2021-03-20 NOTE — Telephone Encounter (Signed)
See other note sent to Dr Ninfa Linden. Waiting to be advised.

## 2021-03-20 NOTE — Telephone Encounter (Signed)
Pls advise.  

## 2021-03-20 NOTE — Telephone Encounter (Signed)
Patient called stating that she is having pain in her left leg.  Stated that it hurts to touch, its numb, and very painful.  Has used ice, but its not helping.  Would like to know what she needs to do or if a Rx can be sent to her pharmacy?  CB# [phone].  Please advise.  Thank you.

## 2021-03-23 NOTE — Telephone Encounter (Signed)
She can still hurt for a while after joint replacement surgery.  We would just need to see her again in the office to reevaluate her.

## 2021-03-23 NOTE — Telephone Encounter (Signed)
Can you give her the message that it's normal to have this pain after surgery but we can have her come in on his next available and check it out?>

## 2021-03-23 NOTE — Telephone Encounter (Signed)
She states the pain she has/had in the thigh is still there, she is wondering how long this would last I was going to make her an appointment but she said this was bothering her last time you saw her

## 2021-04-01 ENCOUNTER — Ambulatory Visit: Payer: Medicare Other | Admitting: Podiatry

## 2021-04-13 ENCOUNTER — Other Ambulatory Visit: Payer: Self-pay | Admitting: Cardiovascular Disease

## 2021-04-14 ENCOUNTER — Other Ambulatory Visit: Payer: Self-pay

## 2021-04-14 ENCOUNTER — Ambulatory Visit (HOSPITAL_COMMUNITY)
Admission: RE | Admit: 2021-04-14 | Discharge: 2021-04-14 | Disposition: A | Discharge status: Home / Self Care | Payer: Medicare Other | Source: Ambulatory Visit | Attending: Cardiovascular Disease | Admitting: Cardiovascular Disease

## 2021-04-14 DIAGNOSIS — R0989 Other specified symptoms and signs involving the circulatory and respiratory systems: Secondary | ICD-10-CM | POA: Diagnosis not present

## 2021-04-14 DIAGNOSIS — I6523 Occlusion and stenosis of bilateral carotid arteries: Secondary | ICD-10-CM | POA: Diagnosis present

## 2021-05-25 ENCOUNTER — Ambulatory Visit: Payer: Medicare Other | Admitting: Cardiology

## 2021-06-03 ENCOUNTER — Ambulatory Visit: Payer: Medicare Other | Admitting: Cardiovascular Disease

## 2021-08-05 ENCOUNTER — Encounter: Payer: Self-pay | Admitting: Orthopaedic Surgery

## 2021-08-05 ENCOUNTER — Other Ambulatory Visit: Payer: Self-pay

## 2021-08-05 ENCOUNTER — Ambulatory Visit (INDEPENDENT_AMBULATORY_CARE_PROVIDER_SITE_OTHER): Payer: Medicare Other | Admitting: Orthopaedic Surgery

## 2021-08-05 ENCOUNTER — Ambulatory Visit: Payer: Self-pay

## 2021-08-05 ENCOUNTER — Encounter: Payer: Self-pay | Admitting: *Deleted

## 2021-08-05 DIAGNOSIS — Z96642 Presence of left artificial hip joint: Secondary | ICD-10-CM | POA: Diagnosis not present

## 2021-08-05 NOTE — Progress Notes (Signed)
The patient is an 87 year old female who has a history of both her hips being replaced.  Her left hip replaced more recent in December 2021 and the right hip was replaced in 2019.  She is here for follow-up visit relates to both her hips being replaced.  She does ambulate with a cane.  Her biggest complaint is pain along the lateral hip and down to the knee and IT band area.  She occasionally has numbness going into her feet.  She is diabetic.  She cannot take anti-inflammatories due to being on Plavix.  Both hips move smoothly and fluidly.  She is very sensitive over the left trochanteric area mid IT band and distal IT band.  An AP pelvis shows bilateral total hip arthroplasties with no complicating features and a well-seated with no evidence of loosening.  The next step for her will be setting her up for outpatient physical therapy for the therapist to work on any modalities that can help decrease the pain along her left hip trochanteric area and IT band all the way down to the knee with any modalities per the therapist discretion.  We will see her back in 6 weeks to see how she is doing overall.  She agrees with this treatment plan.

## 2021-08-11 ENCOUNTER — Ambulatory Visit: Payer: Medicare Other | Admitting: Diagnostic Neuroimaging

## 2021-08-11 ENCOUNTER — Encounter: Payer: Self-pay | Admitting: Diagnostic Neuroimaging

## 2021-08-11 VITALS — BP 130/75 | HR 89 | Ht 63.0 in | Wt 154.5 lb

## 2021-08-11 DIAGNOSIS — E1142 Type 2 diabetes mellitus with diabetic polyneuropathy: Secondary | ICD-10-CM

## 2021-08-11 NOTE — Progress Notes (Signed)
[facility]  PATIENT: [name] DOB: 1933/01/20  REFERRING CLINICIAN: Madelin Rear, MD HISTORY FROM: patient  REASON FOR VISIT: new consult    HISTORICAL  CHIEF COMPLAINT:  Chief Complaint  Patient presents with   New Patient (Initial Visit)    Rm 6 alone. Here for consult on worsening tingling/pain in her feet. Reports sx have been present for over a year. Sx are equal in both. She also reports decrease in balance over the last few months. Denies any falls.    HISTORY OF PRESENT ILLNESS:   87 year old female with diabetes here for evaluation of numbness in feet.  Symptoms of been present for past few years.  She describes numbness, pulling sensation, burning and stabbing pain on the bottom of her feet.  She has some mild low back pain radiating to her right hip.  She has been on gabapentin 400 mg twice a day with mild relief.  She also takes B12 supplement and folic acid.   REVIEW OF SYSTEMS: Full 14 system review of systems performed and negative with exception of: as per HPI.  ALLERGIES: Allergies  Allergen Reactions   Fish Allergy Hives and Shortness Of Breath   Ivp Dye [Iodinated Diagnostic Agents] Shortness Of Breath and Anaphylaxis   Latex Anaphylaxis, Hives, Shortness Of Breath, Itching and Other (See Comments)    REACTION: wheezing   Shellfish Allergy Anaphylaxis, Hives, Shortness Of Breath and Other (See Comments)    All seafood   Evolocumab     Other reaction(s): latex on syringe   Other Itching and Other (See Comments)    Patient reports allergy to perfumed detergents. Causes itching.   Metformin Nausea Only and Nausea And Vomiting    HOME MEDICATIONS: Outpatient Medications Prior to Visit  Medication Sig Dispense Refill   acetaminophen (TYLENOL) 650 MG CR tablet Take 650 mg by mouth every 8 (eight) hours as needed for pain.     acetaminophen-codeine (TYLENOL #3) 300-30 MG tablet Take 1-2 tablets by mouth every 8 (eight) hours  as needed. 30 tablet 0   carvedilol (COREG) 6.25 MG tablet TAKE 1 TABLET BY MOUTH TWICE A DAY 180 tablet 0   Cholecalciferol (VITAMIN D3) 10 MCG (400 UNIT) tablet Take 400 Units by mouth daily.     clopidogrel (PLAVIX) 75 MG tablet Take 75 mg by mouth daily.     Coenzyme Q10 (CO Q 10 PO) Take 1 tablet by mouth every other day.      ezetimibe (ZETIA) 10 MG tablet Take 10 mg by mouth at bedtime.     Flaxseed, Linseed, (FLAXSEED OIL PO) Take 1 capsule by mouth daily.     folic acid (FOLVITE) 1 MG tablet Take 1 mg by mouth daily.  1   furosemide (LASIX) 40 MG tablet Take 40 Mg (1 tablet) daily 90 tablet 1   gabapentin (NEURONTIN) 400 MG capsule Take 400 mg by mouth 3 (three) times daily.     glimepiride (AMARYL) 1 MG tablet Take 3 mg by mouth daily with breakfast.   12   isosorbide mononitrate (IMDUR) 60 MG 24 hr tablet TAKE 1.5 TABLETS (90 MG TOTAL) BY MOUTH DAILY. . 135 tablet 3   LORazepam (ATIVAN) 0.5 MG tablet Take 0.5 mg by mouth every 6 (six) hours as needed for anxiety or sleep.   5   methotrexate (RHEUMATREX) 2.5 MG tablet Take 20 mg by mouth every Friday. (8 tablets)  1   MULTIPLE VITAMIN-FOLIC ACID PO  nitroGLYCERIN (NITROSTAT) 0.4 MG SL tablet PLACE 1 TABLET (0.4 MG TOTAL) UNDER THE TONGUE EVERY 5 (FIVE) MINUTES AS NEEDED FOR CHEST PAIN. 25 tablet 6   pantoprazole (PROTONIX) 40 MG tablet Take 1 tablet (40 mg total) by mouth 2 (two) times daily before a meal. 60 tablet 1   predniSONE (DELTASONE) 5 MG tablet Take 5 mg by mouth daily with breakfast.      rosuvastatin (CRESTOR) 40 MG tablet TAKE 1 TABLET BY MOUTH EVERYDAY AT BEDTIME 90 tablet 2   sulfaSALAzine (AZULFIDINE) 500 MG EC tablet Take 500 mg by mouth 2 (two) times daily.     methocarbamol (ROBAXIN) 500 MG tablet Take 1 tablet (500 mg total) by mouth every 8 (eight) hours as needed for muscle spasms. (Patient not taking: Reported on 08/11/2021) 30 tablet 0   traMADol (ULTRAM) 50 MG tablet Take 1-2 tablets (50-100 mg total) by  mouth every 6 (six) hours as needed for moderate pain. (Patient not taking: Reported on 08/11/2021) 40 tablet 0   No facility-administered medications prior to visit.    PAST MEDICAL HISTORY: Past Medical History:  Diagnosis Date   Allergy to IVP dye    Angina, class I ([place]) 04/22/2015   Atherosclerotic heart disease native coronary artery w/angina pectoris ([place]) 1992   a. 1992 s/p PTCA of LAD;  b. 1998 CABG x 3; c. 07/2001 Cath: Sev LM/LAD/LCX dzs, 3/3 patent grafts; d. 11/2013 Cath: stable graft anatomy; e. 10/2016 Cath: native 3VD, VG->OM nl, LIMA->LAD nl, VG->Diag 55.   Atherosclerotic heart disease of native coronary artery with angina pectoris ([place]) 01/14/2010   Qualifier: Diagnosis of  By: Deatra Ina MD, Sandy Salaam    Atrial septal aneurysm    Barrett's esophagus 03/04/2010   Qualifier: Diagnosis of  By: Shane Crutch, Amy S    Bilateral leg edema 04/22/2015   Bradycardia, mild briefly to the 40s, asymptomatic 05/16/2013   Carotid arterial disease ([place])    a. 05/2014 Carotid U/S: No signif bilat ICA stenosis, >60% L ECA.   Chronic anemia    Chronic diastolic CHF (congestive heart failure) ([place])    a. 10/2016 Echo: Ef 55-60%, no rwma, Gr1 DD, triv AI, PASP 67mHg, atrial septal aneurysm.   Chronic diastolic CHF (congestive heart failure), NYHA class 2 ([place] 08/11/2017   Claudication ([place] 05/16/2013   Peripheral arterial occlusive disease with lifestyle limiting claudication    Degenerative joint disease 05/30/2018   DIVERTICULOSIS OF COLON 01/14/2010   Qualifier: Diagnosis of  By: KDeatra InaMD, RSandy Salaam   DM (diabetes mellitus), type 2 with peripheral vascular complications ([place]    On Oral medications.   DOE (dyspnea on exertion) 04/22/2015   Essential hypertension    FECAL INCONTINENCE 03/04/2010   Qualifier: Diagnosis of  By: ETrellis PaganiniPA-c, Amy S    GERD (gastroesophageal reflux disease)    Hyperlipidemia with target LDL less than 70    Inflammatory arthritis 10/13/2017    Neuropathy    Non-insulin dependent type 2 diabetes mellitus ([place] 10/24/2015   NSTEMI (non-ST elevated myocardial infarction) ([place] 11/01/2016   PAD (peripheral artery disease) ([place] 05/2013; 09/2013   a. severe calcified POP-1 & TP Trunk Dz bilat;  b . 05/2013 Diamondback Rot Athrectomy (R POP) --> PTA R Pop, DES to R Peroneal;  c. 09/2013 PTA/Stenting of L Pop ([name]- retrograde access);  d. 04/2014 ABI's: R/L: 1.1/1.1.   Preoperative cardiovascular examination 04/15/2020   PUD (peptic ulcer disease)    Rheumatoid arthritis ([place]  S/P CABG x 3 1998   LIMA-LAD, SVG-OM, SVG-D1   Severe claudication ([place]) 05/2013   Referred for Peripheral Angio (Dr. Gwenlyn Found --> Dr. Brunetta Jeans in [place])   [place] 01/14/2010   Qualifier: Diagnosis of  By: Harlon Ditty CMA ([place]), [name]     Status post total replacement of right hip 10/12/2018   Vertigo 01/02/2014    PAST SURGICAL HISTORY: Past Surgical History:  Procedure Laterality Date   ATHERECTOMY Right 05/15/2013   Procedure: ATHERECTOMY;  Surgeon: Lorretta Harp, MD;  Location: [facility] CATH LAB;  Service: Cardiovascular;  Laterality: Right;  popliteal   BACK SURGERY     CARDIAC CATHETERIZATION  07/17/01   2 v CAD with LM, circ, LAD, obtuse diag, mod stenosis of diag vein graft , mild stenosis of marg vein graft, nl EF, medical treatment   CARDIAC CATHETERIZATION  11/2013   Widely patent LIMA-LAD & SVG-OM with mild progression of proximal stenosis ~40-50% in SVG-D1; Widely patent native RCA with known severe native LCA disease   CARDIAC CATHETERIZATION N/A 11/01/2016   Procedure: Left Heart Cath and Cors/Grafts Angiography;  Surgeon: [name], MD;  Location: [facility] CV LAB;  Service: Cardiovascular: Hemodynamics: High LVEDP!.  Cors/Grafts: LM 55%, o-p LAD 100% then after D1 -> LIMA-LAD patent, SVG-Diag ~55%. small RI wiht ~70% ostial. O-p Cx 80% - pCx 70% --> SVG-bifurcating OM patent. -- med Rx   CORONARY ANGIOPLASTY  1992   PCI to  LAD   CORONARY ARTERY BYPASS GRAFT  1998   LIMA-LAD, SVG-diagonal (none roughly 50% stenosis), SVG-OM   LEFT HEART CATHETERIZATION WITH CORONARY ANGIOGRAM N/A 11/27/2013   Procedure: LEFT HEART CATHETERIZATION WITH CORONARY ANGIOGRAM;  Surgeon: [name], MD;  Location: [facility] CATH LAB;  Service: Cardiovascular;  Laterality: N/A;   LOW EXTREMITY DOPPLERS/ABI  10/2016   Patent right SFA, popliteal and peroneal tendons. Patent left SFA and popliteal stent. 30-50% stenosis in the right common femoral and popliteal arteries, 50-74% stenosis in left profunda femoris artery. --> 1 year follow-up.  RABI - 1.2, LABI 1.1   LOWER EXTREMITY ANGIOGRAM N/A 02/22/2013   Procedure: LOWER EXTREMITY ANGIOGRAM;  Surgeon: Lorretta Harp, MD;  Location: [facility] CATH LAB;  Service: Cardiovascular;  Laterality: N/A;   LOWER EXTREMITY ANGIOGRAM N/A 07/20/2013   Procedure: LOWER EXTREMITY ANGIOGRAM;  Surgeon: Lorretta Harp, MD;  Location: [facility] CATH LAB;  Service: Cardiovascular;  Laterality: N/A;   NM MYOVIEW LTD  04/03/2013; 5/26'/2016   Lexiscan: a)  Apical perfusion defect with no ischemia. Consider prior infarct versus breast attenuation.;; b) 5/'16: LOW RISK, Nl EF ~55-65%, No Ischemia /Infarction   NM MYOVIEW LTD  10/'19; 4/'21   a) LOW RISK.  EF 55%.  No ischemia or infarction. b) EF estimated 81%.  Suboptimal images.  May be subtle inferior ischemia-initially read by radiology as intermediate risk, however overread by cardiology to be mild low risk.Marland Kitchen   PERCUTANEOUS STENT INTERVENTION Right 05/15/2013   Procedure: PERCUTANEOUS STENT INTERVENTION;  Surgeon: Lorretta Harp, MD;  Location: [facility] CATH LAB;  Service: Cardiovascular;  Laterality: Right;  tibioperoneal trunk   Peroneal Artery Stent  05/15/13   Diamondback rot. atherectomy of high grade segmental popliteal stenosis then Chocolate balloonPTA; then angiosculpt PTA of the prox peroneal with stent-Xpedition    pv angio  07/20/13   high-grade calcified popliteal disease  with one-vessel runoff via a small anterior tibial artery that has proximal disease as well, no intervention   TOTAL HIP ARTHROPLASTY Right 10/12/2018  Procedure: RIGHT TOTAL HIP ARTHROPLASTY ANTERIOR APPROACH;  Surgeon: Mcarthur Rossetti, MD;  Location: [facility] ORS;  Service: Orthopedics;  Laterality: Right;   TOTAL HIP ARTHROPLASTY Left 11/21/2020   Procedure: LEFT TOTAL HIP ARTHROPLASTY ANTERIOR APPROACH;  Surgeon: Mcarthur Rossetti, MD;  Location: [facility] ORS;  Service: Orthopedics;  Laterality: Left;   TRANSTHORACIC ECHOCARDIOGRAM  11/'17; 10/19   a) EF 55-60%. Gr 1 DD. Normal PAP. Aortic Sclerosis.;; b) Normal LV size and function with vigorous EF 65 to 70%.  GR 1 DD.  Severe LA dilation (suggestive of probably worsening: GR 1 DD).   TRANSTHORACIC ECHOCARDIOGRAM  03/18/2020   EF 70-75%.  Hyperdynamic.  GR 1 DD.  No R WMA.  Mild LA dilation.  Mild aortic valve sclerosis but no stenosis.-No change from prior echo   TUBAL LIGATION      FAMILY HISTORY: Family History  Adopted: Yes  Problem Relation Age of Onset   Hypertension Mother    Hyperlipidemia Mother    Hyperlipidemia Father    Hypertension Father     SOCIAL HISTORY: Social History   Socioeconomic History   Marital status: Married    Spouse name: Not on file   Number of children: Not on file   Years of education: Not on file   Highest education level: Not on file  Occupational History   Occupation: retired  Tobacco Use   Smoking status: Never   Smokeless tobacco: Never  Vaping Use   Vaping Use: Never used  Substance and Sexual Activity   Alcohol use: Not Currently    Comment: rare glass of wine    Drug use: No   Sexual activity: Not on file  Other Topics Concern   Not on file  Social History Narrative   Right handed   Lives alone ( husband in rehab facility)   Caffeine- mostly coffee       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   Social Determinants of Health   Financial Resource Strain: Not on file  Food Insecurity: Not on  file  Transportation Needs: Not on file  Physical Activity: Not on file  Stress: Not on file  Social Connections: Not on file  Intimate Partner Violence: Not on file     PHYSICAL EXAM  GENERAL EXAM/CONSTITUTIONAL: Vitals:  Vitals:   08/11/21 1511  BP: 130/75  Pulse: 89  Weight: 154 lb 8 oz (70.1 kg)  Height: '5\' 3"'$  (1.6 m)   Body mass index is 27.37 kg/m. Wt Readings from Last 3 Encounters:  08/11/21 154 lb 8 oz (70.1 kg)  11/21/20 149 lb (67.6 kg)  11/19/20 149 lb (67.6 kg)   Patient is in no distress; well developed, nourished and groomed; neck is supple  CARDIOVASCULAR: Examination of carotid arteries is normal; no carotid bruits Regular rate and rhythm, no murmurs Examination of peripheral vascular system by observation and palpation is normal  EYES: Ophthalmoscopic exam of optic discs and posterior segments is normal; no papilledema or hemorrhages No results found.  MUSCULOSKELETAL: Gait, strength, tone, movements noted in Neurologic exam below  NEUROLOGIC: MENTAL STATUS:  No flowsheet data found. awake, alert, oriented to person, place and time recent and remote memory intact normal attention and concentration language fluent, comprehension intact, naming intact fund of knowledge appropriate  CRANIAL NERVE:  2nd - no papilledema on fundoscopic exam 2nd, 3rd, 4th, 6th - pupils equal and reactive to light, visual fields full to confrontation, extraocular muscles intact, no nystagmus 5th - facial sensation symmetric 7th - facial strength symmetric  8th - hearing intact 9th - palate elevates symmetrically, uvula midline 11th - shoulder shrug symmetric 12th - tongue protrusion midline  MOTOR:  normal bulk and tone, full strength in the BUE, BLE  SENSORY:  normal and symmetric to light touch, pinprick, temperature, vibration; EXCEPT DECR IN BILATERAL FEET (RIGHT WORSE THAN LEFT)  COORDINATION:  finger-nose-finger, fine finger movements  normal  REFLEXES:  deep tendon reflexes trace and symmetric  GAIT/STATION:  narrow based gait     DIAGNOSTIC DATA (LABS, IMAGING, TESTING) - I reviewed patient records, labs, notes, testing and imaging myself where available.  Lab Results  Component Value Date   WBC 7.7 11/24/2020   HGB 8.5 (L) 11/24/2020   HCT 28.0 (L) 11/24/2020   MCV 93.3 11/24/2020   PLT 183 11/24/2020      Component Value Date/Time   NA 139 11/22/2020 0314   NA 140 04/19/2019 1141   K 3.8 11/22/2020 0314   CL 103 11/22/2020 0314   CO2 28 11/22/2020 0314   GLUCOSE 94 11/22/2020 0314   BUN 13 11/22/2020 0314   BUN 14 04/19/2019 1141   CREATININE 0.88 11/22/2020 0314   CREATININE 0.81 05/30/2018 1122   CALCIUM 8.1 (L) 11/22/2020 0314   PROT 7.6 11/17/2020 1417   PROT 7.3 05/19/2017 1254   ALBUMIN 4.0 11/17/2020 1417   ALBUMIN 4.4 05/19/2017 1254   AST 28 11/17/2020 1417   ALT 14 11/17/2020 1417   ALKPHOS 41 11/17/2020 1417   BILITOT 0.4 11/17/2020 1417   BILITOT 0.3 05/19/2017 1254   GFRNONAA >60 11/22/2020 0314   GFRNONAA 53 (L) 04/26/2013 1415   GFRAA 55 (L) 07/10/2020 1627   GFRAA 61 04/26/2013 1415   Lab Results  Component Value Date   CHOL 177 03/19/2020   HDL 51 03/19/2020   LDLCALC 119 (H) 03/19/2020   TRIG 35 03/19/2020   CHOLHDL 3.5 03/19/2020   Lab Results  Component Value Date   HGBA1C 7.3 (H) 11/17/2020   Lab Results  Component Value Date   [id number] 02/05/2019   Lab Results  Component Value Date   TSH 1.158 05/05/2020     01/17/20 NCV/EMG (bilateral upper ext) - This a normal study.  No electrodiagnostic evidence of large fiber neuropathy.   ASSESSMENT AND PLAN  87 y.o. year old female here with:   Dx:  1. Diabetic polyneuropathy associated with type 2 diabetes mellitus (HCC)      PLAN:  DIABETIC NEUROPATHY - continue gabapentin 400-'800mg'$  twice a day - consider duloxetine 30-'60mg'$  daily - consider capsaicin cream, lidocaine patch / cream,  alpha-lipoic acid '600mg'$  daily - consider follow up with podiatry for toe pain and plantar fascitis symptoms  Return for return to PCP, pending if symptoms worsen or fail to improve.    Penni Bombard, MD [id number], [id number] PM Certified in Neurology, Neurophysiology and Neuroimaging  [facility], [facility] [address], [place], [place] [address] [phone]

## 2021-08-11 NOTE — Patient Instructions (Addendum)
  DIABETIC NEUROPATHY - continue gabapentin '400mg'$  twice a day; try to increase to three times a day - consider capsaicin cream, lidocaine patch / cream, alpha-lipoic acid '600mg'$  daily - consider follow up with podiatry for toe pain and plantar fascitis symptoms

## 2021-08-14 ENCOUNTER — Ambulatory Visit: Payer: Medicare Other | Admitting: Cardiovascular Disease

## 2021-08-14 ENCOUNTER — Other Ambulatory Visit: Payer: Self-pay

## 2021-08-14 ENCOUNTER — Encounter: Payer: Self-pay | Admitting: Cardiovascular Disease

## 2021-08-14 VITALS — BP 185/84 | HR 71 | Ht 63.0 in | Wt 157.0 lb

## 2021-08-14 DIAGNOSIS — I739 Peripheral vascular disease, unspecified: Secondary | ICD-10-CM

## 2021-08-14 DIAGNOSIS — E785 Hyperlipidemia, unspecified: Secondary | ICD-10-CM | POA: Diagnosis not present

## 2021-08-14 DIAGNOSIS — I25119 Atherosclerotic heart disease of native coronary artery with unspecified angina pectoris: Secondary | ICD-10-CM | POA: Diagnosis not present

## 2021-08-14 DIAGNOSIS — I5032 Chronic diastolic (congestive) heart failure: Secondary | ICD-10-CM | POA: Diagnosis not present

## 2021-08-14 MED ORDER — AMLODIPINE BESYLATE 5 MG PO TABS: 5.0000 mg | ORAL_TABLET | Freq: Every day | ORAL | 3 refills | Status: DC

## 2021-08-14 NOTE — Assessment & Plan Note (Signed)
History of peripheral arterial disease status post multiple peripheral interventions dating back to 2014.  She has had orbital atherectomy and angioplasty of the distal right SFA and stenting of her peroneal artery.  She had intervention by Dr. Andree Elk 10/01/2013 of her left popliteal artery and tibioperoneal trunk.  She does complain of pain in both of her legs.  Her most recent lower extremity arterial Doppler studies performed 03/31/2020 revealed a right ABI of 0.39 and a left of 1.12.  She has an occluded right popliteal artery and a widely patent left.

## 2021-08-14 NOTE — Progress Notes (Signed)
08/14/2021 [name]   1933-04-20  [id number]  Primary Physician Monica Morning, DO Primary Cardiologist: Monica Harp MD [name], [name]  HPI:  [name] is a 87 y.o.  married African American female patient of Dr. Shanon Brow Harding's and Dr. Leigh Aurora. I last saw her in the office    11/19/2020.Monica KitchenShe has known CAD status post coronary bypass grafting in the past (1998). She has preserved LV function and nonischemic Myoview. Because of claudication she underwent peripheral vascular angiography and other [name] 02/22/13 revealing significant popliteal and infrapopliteal disease. She has lifestyle limiting claudication.on 05/16/13 she underwent diamondback orbital rotational atherectomy, PTA of her distal right SFA and stenting using coronary drug-eluting stent for peroneal artery which was her only patent artery on the right. She does not notice any improvement and claudication since her procedure although she says her right leg his less "heavy" and that her left leg is much more symptomatic.I restudied her 07/20/13 with the intention of fixing her left leg however I decided to defer this because of anatomic complexity. Ultimately I  referred her to Dr. Brunetta Flores in [place] who performed intervention on her left popliteal and tibioperoneal trunk on 10/01/13. Followup Dopplers performed 04/29/14 were excellent with ABIs of greater than 1 bilaterally, patent peroneal stent on the right a patent left popliteal. Subsequent Dopplers performed in December showed a right ABI of 0.81 and a left ABI 0.91 with patent stents   She had done well after intervention and actually said that she was able to ambulate with claudication. Her last Doppler studies performed 11/10/16 revealed normal ABIs bilaterally with patent SFAs and infrapopliteal vessels. She has noticed some superficial hypersensitivity on her left pretibial area that had venous Doppler studies performed recently that showed no evidence  of DVT.    She did have a right total hip replacement 11/19 which she is still recovering from.  Recent lower extremity arterial Doppler studies performed 06/21/2019 revealed a decline in her right ABI 0..76 with a high-frequency signal in her right popliteal artery and monophasic waveforms below that.  She complains of pain in both legs from the knee down left greater than right at rest and with exertion despite normal left ABIs and a palpable pedal pulse on that side.    She apparently needs an elective left total knee replacement.  She has difficulty ambulating mostly because of pain in her left leg.  She also has chronic lower extremity edema left greater than right on low-dose furosemide which we are going to increase from 20 to 40 mg a day.  She takes occasional sublingual nitroglycerin every other week for chest pain.  She did have a nonischemic Myoview for preoperative clearance before her right total hip replacement 09/15/2018.   She was admitted to the hospital on 03/18/2020 with chest pain.  She ruled out for myocardial infarction.  A Myoview stress test was read as low risk with mild inferior ischemia.  She also had lower extremity arterial Doppler studies performed in our office 03/31/2020 which revealed a right ABI of 0.39 with an occluded right popliteal artery and a left of 1.12.  I cleared her for her left total hip replacement low risk given her recent Myoview stress test results.  She does not really complain of lifestyle limiting claudication on the right.   Unfortunately her left total hip replacement was delayed because of COVID-19 and i that was successfully performed in December of last year.[name]  She  had a low risk Myoview stress test.  She had a right total knee replacement back in 2019.  She denies chest pain or shortness of breath.  Since I saw her 9 months ago she continues to do well.  She ambulates with some difficulty.  She did complain of some chest pain recently but is under a lot  of stress because her son-in-law is on life support.  She still recovering from her left total hip replacement.   Current Meds  Medication Sig   acetaminophen (TYLENOL) 650 MG CR tablet Take 650 mg by mouth every 8 (eight) hours as needed for pain.   acetaminophen-codeine (TYLENOL #3) 300-30 MG tablet Take 1-2 tablets by mouth every 8 (eight) hours as needed.   amLODipine (NORVASC) 5 MG tablet Take 1 tablet (5 mg total) by mouth daily.   carvedilol (COREG) 6.25 MG tablet TAKE 1 TABLET BY MOUTH TWICE A DAY   Cholecalciferol (VITAMIN D3) 10 MCG (400 UNIT) tablet Take 400 Units by mouth daily.   clopidogrel (PLAVIX) 75 MG tablet Take 75 mg by mouth daily.   Coenzyme Q10 (CO Q 10 PO) Take 1 tablet by mouth every other day.    ezetimibe (ZETIA) 10 MG tablet Take 10 mg by mouth at bedtime.   folic acid (FOLVITE) 1 MG tablet Take 1 mg by mouth daily.   furosemide (LASIX) 40 MG tablet Take 40 Mg (1 tablet) daily   gabapentin (NEURONTIN) 400 MG capsule Take 400 mg by mouth 3 (three) times daily.   glimepiride (AMARYL) 1 MG tablet Take 2 mg by mouth daily with breakfast.   isosorbide mononitrate (IMDUR) 60 MG 24 hr tablet TAKE 1.5 TABLETS (90 MG TOTAL) BY MOUTH DAILY. . (Patient taking differently: Take 60 mg by mouth daily. [name])   methocarbamol (ROBAXIN) 500 MG tablet Take 1 tablet (500 mg total) by mouth every 8 (eight) hours as needed for muscle spasms.   methotrexate (RHEUMATREX) 2.5 MG tablet Take 20 mg by mouth every Friday. (8 tablets)   MULTIPLE VITAMIN-FOLIC ACID PO    nitroGLYCERIN (NITROSTAT) 0.4 MG SL tablet PLACE 1 TABLET (0.4 MG TOTAL) UNDER THE TONGUE EVERY 5 (FIVE) MINUTES AS NEEDED FOR CHEST PAIN.   pantoprazole (PROTONIX) 40 MG tablet Take 1 tablet (40 mg total) by mouth 2 (two) times daily before a meal.   predniSONE (DELTASONE) 5 MG tablet Take 5 mg by mouth daily with breakfast.    rosuvastatin (CRESTOR) 40 MG tablet TAKE 1 TABLET BY MOUTH EVERYDAY AT BEDTIME   sulfaSALAzine  (AZULFIDINE) 500 MG EC tablet Take 500 mg by mouth 2 (two) times daily.     Allergies  Allergen Reactions   Fish Allergy Hives and Shortness Of Breath   Ivp Dye [Iodinated Diagnostic Agents] Shortness Of Breath and Anaphylaxis   Latex Anaphylaxis, Hives, Shortness Of Breath, Itching and Other (See Comments)    REACTION: wheezing   Shellfish Allergy Anaphylaxis, Hives, Shortness Of Breath and Other (See Comments)    All seafood   Evolocumab     Other reaction(s): latex on syringe   Other Itching and Other (See Comments)    Patient reports allergy to perfumed detergents. Causes itching.   Metformin Nausea Only and Nausea And Vomiting    Social History   Socioeconomic History   Marital status: Married    Spouse name: Not on file   Number of children: Not on file   Years of education: Not on file   Highest education level: Not on  file  Occupational History   Occupation: retired  Tobacco Use   Smoking status: Never   Smokeless tobacco: Never  Vaping Use   Vaping Use: Never used  Substance and Sexual Activity   Alcohol use: Not Currently    Comment: rare glass of wine    Drug use: No   Sexual activity: Not on file  Other Topics Concern   Not on file  Social History Narrative   Right handed   Lives alone ( husband in rehab facility)   Caffeine- mostly coffee       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   Social Determinants of Health   Financial Resource Strain: Not on file  Food Insecurity: Not on file  Transportation Needs: Not on file  Physical Activity: Not on file  Stress: Not on file  Social Connections: Not on file  Intimate Partner Violence: Not on file     Review of Systems: General: negative for chills, fever, night sweats or weight changes.  Cardiovascular: negative for chest pain, dyspnea on exertion, edema, orthopnea, palpitations, paroxysmal nocturnal dyspnea or shortness of breath Dermatological: negative for rash Respiratory: negative for cough or wheezing Urologic:  negative for hematuria Abdominal: negative for nausea, vomiting, diarrhea, bright red blood per rectum, melena, or hematemesis Neurologic: negative for visual changes, syncope, or dizziness All other systems reviewed and are otherwise negative except as noted above.    Blood pressure (!) 185/84, pulse 71, height '5\' 3"'$  (1.6 m), weight 157 lb (71.2 kg), SpO2 99 %.  General appearance: alert and no distress Neck: no adenopathy, no JVD, supple, symmetrical, trachea midline, thyroid not enlarged, symmetric, no tenderness/mass/nodules, and soft right carotid bruit Lungs: clear to auscultation bilaterally Heart: regular rate and rhythm, S1, S2 normal, no murmur, click, rub or gallop Extremities: extremities normal, atraumatic, no cyanosis or edema Pulses: 2+ and symmetric Skin: Skin color, texture, turgor normal. No rashes or lesions Neurologic: Grossly normal  EKG sinus rhythm at 71 without ST or T wave changes.  I personally reviewed this EKG.  ASSESSMENT AND PLAN:   Atherosclerotic heart disease of native coronary artery with angina pectoris ([place]) History of CAD status post coronary artery bypass grafting 1998.  She had a Myoview stress test performed 03/18/2020 and suggested subtle inferior ischemia.  Myoview performed in 2019 was entirely normal.  She did have recent chest pain lasting off-and-on for several days but she says she is under a lot of stress for personal reasons (son-in-law on life support)..  She has had no recurrent symptoms.  PAD,severe calcified pop and tibial peroneal trunk disease bilateraly History of peripheral arterial disease status post multiple peripheral interventions dating back to 2014.  She has had orbital atherectomy and angioplasty of the distal right SFA and stenting of her peroneal artery.  She had intervention by Dr. Andree Elk 10/01/2013 of her left popliteal artery and tibioperoneal trunk.  She does complain of pain in both of her legs.  Her most recent lower  extremity arterial Doppler studies performed 03/31/2020 revealed a right ABI of 0.39 and a left of 1.12.  She has an occluded right popliteal artery and a widely patent left.  Dyslipidemia, goal LDL below 70 History of dyslipidemia on Zetia and rosuvastatin with lipid profile performed 10/14/2020 revealing total cholesterol of 57, LDL of 91 and HDL 40.  Essential hypertension History of essential hypertension a blood pressure measured today 185/84.  She is on carvedilol.  I am going to begin her on amlodipine 5 mg a day and  have her keep a 30-day blood pressure log.  She will see a Pharm.D. back in 1 month to review make further recommendations.  Chronic diastolic heart failure (HCC) History of chronic diastolic heart failure with normal 2D echocardiogram performed 03/18/2020.  She did have grade 1 diastolic dysfunction.  She is on furosemide.     Monica Harp MD FACP,FACC,FAHA, [facility] 08/14/2021 12:02 PM

## 2021-08-14 NOTE — Assessment & Plan Note (Signed)
History of chronic diastolic heart failure with normal 2D echocardiogram performed 03/18/2020.  She did have grade 1 diastolic dysfunction.  She is on furosemide.

## 2021-08-14 NOTE — Assessment & Plan Note (Signed)
History of dyslipidemia on Zetia and rosuvastatin with lipid profile performed 10/14/2020 revealing total cholesterol of 57, LDL of 91 and HDL 40.

## 2021-08-14 NOTE — Patient Instructions (Addendum)
Medication Instructions:  START amlodipine '5mg'$  daily for blood pressure  *If you need a refill on your cardiac medications before your next appointment, please call your pharmacy*   Testing/Procedures: Carotid Doppler done in May looked good per Dr. Gwenlyn Found   Follow-Up: At [facility], you and your health needs are our priority.  As part of our continuing mission to provide you with exceptional heart care, we have created designated Provider Care Teams.  These Care Teams include your primary Cardiologist (physician) and Advanced Practice Providers (APPs -  Physician Assistants and Nurse Practitioners) who all work together to provide you with the care you need, when you need it.  We recommend signing up for the patient portal called "MyChart".  Sign up information is provided on this After Visit Summary.  MyChart is used to connect with patients for Virtual Visits (Telemedicine).  Patients are able to view lab/test results, encounter notes, upcoming appointments, etc.  Non-urgent messages can be sent to your provider as well.   To learn more about what you can do with MyChart, go to [email or web address].    Your next appointment:   4 weeks with clinical pharmacist for BP  3 months with PA/NP  6 months with Dr. Gwenlyn Found  Please check BP at home at least once daily and record readings. Please bring this log to your visit with the pharmacist

## 2021-08-14 NOTE — Assessment & Plan Note (Signed)
History of CAD status post coronary artery bypass grafting 1998.  She had a Myoview stress test performed 03/18/2020 and suggested subtle inferior ischemia.  Myoview performed in 2019 was entirely normal.  She did have recent chest pain lasting off-and-on for several days but she says she is under a lot of stress for personal reasons (son-in-law on life support)..  She has had no recurrent symptoms.

## 2021-08-14 NOTE — Assessment & Plan Note (Signed)
History of essential hypertension a blood pressure measured today 185/84.  She is on carvedilol.  I am going to begin her on amlodipine 5 mg a day and have her keep a 30-day blood pressure log.  She will see a Pharm.D. back in 1 month to review make further recommendations.

## 2021-08-25 ENCOUNTER — Ambulatory Visit: Payer: Medicare Other | Admitting: Physical Therapy

## 2021-08-25 ENCOUNTER — Emergency Department (HOSPITAL_COMMUNITY)
Admission: EM | Admit: 2021-08-25 | Discharge: 2021-08-26 | Disposition: A | Discharge status: Home / Self Care | Payer: Medicare Other | Attending: Emergency Medicine | Admitting: Emergency Medicine

## 2021-08-25 ENCOUNTER — Other Ambulatory Visit: Payer: Self-pay

## 2021-08-25 DIAGNOSIS — Z91040 Latex allergy status: Secondary | ICD-10-CM | POA: Diagnosis not present

## 2021-08-25 DIAGNOSIS — E1140 Type 2 diabetes mellitus with diabetic neuropathy, unspecified: Secondary | ICD-10-CM | POA: Insufficient documentation

## 2021-08-25 DIAGNOSIS — I2510 Atherosclerotic heart disease of native coronary artery without angina pectoris: Secondary | ICD-10-CM | POA: Diagnosis not present

## 2021-08-25 DIAGNOSIS — I110 Hypertensive heart disease with heart failure: Secondary | ICD-10-CM | POA: Diagnosis not present

## 2021-08-25 DIAGNOSIS — Z96641 Presence of right artificial hip joint: Secondary | ICD-10-CM | POA: Diagnosis not present

## 2021-08-25 DIAGNOSIS — R55 Syncope and collapse: Secondary | ICD-10-CM | POA: Diagnosis not present

## 2021-08-25 DIAGNOSIS — Z79899 Other long term (current) drug therapy: Secondary | ICD-10-CM | POA: Diagnosis not present

## 2021-08-25 DIAGNOSIS — Z96642 Presence of left artificial hip joint: Secondary | ICD-10-CM | POA: Insufficient documentation

## 2021-08-25 DIAGNOSIS — I5032 Chronic diastolic (congestive) heart failure: Secondary | ICD-10-CM | POA: Insufficient documentation

## 2021-08-25 LAB — COMPREHENSIVE METABOLIC PANEL
ALT: 21 U/L (ref 0–44)
AST: 25 U/L (ref 15–41)
Albumin: 3.7 g/dL (ref 3.5–5.0)
Alkaline Phosphatase: 36 U/L — ABNORMAL LOW (ref 38–126)
Anion gap: 6 (ref 5–15)
BUN: 16 mg/dL (ref 8–23)
CO2: 27 mmol/L (ref 22–32)
Calcium: 8 mg/dL — ABNORMAL LOW (ref 8.9–10.3)
Chloride: 102 mmol/L (ref 98–111)
Creatinine, Ser: 0.93 mg/dL (ref 0.44–1.00)
GFR, Estimated: >60 mL/min (ref >60)
Glucose, Bld: 166 mg/dL — ABNORMAL HIGH (ref 70–99)
Potassium: 3.7 mmol/L (ref 3.5–5.1)
Sodium: 135 mmol/L (ref 135–145)
Total Bilirubin: 0.7 mg/dL (ref 0.3–1.2)
Total Protein: 7.1 g/dL (ref 6.5–8.1)

## 2021-08-25 LAB — CBC WITH DIFFERENTIAL/PLATELET
Abs Immature Granulocytes: 0.01 10*3/uL (ref 0.00–0.07)
Basophils Absolute: 0 10*3/uL (ref 0.0–0.1)
Basophils Relative: 0 %
Eosinophils Absolute: 0.1 10*3/uL (ref 0.0–0.5)
Eosinophils Relative: 3 %
HCT: 39.5 % (ref 36.0–46.0)
Hemoglobin: 12.3 g/dL (ref 12.0–15.0)
Immature Granulocytes: 0 %
Lymphocytes Relative: 30 %
Lymphs Abs: 1.6 10*3/uL (ref 0.7–4.0)
MCH: 28.4 pg (ref 26.0–34.0)
MCHC: 31.1 g/dL (ref 30.0–36.0)
MCV: 91.2 fL (ref 80.0–100.0)
Monocytes Absolute: 0.5 10*3/uL (ref 0.1–1.0)
Monocytes Relative: 9 %
Neutro Abs: 3.1 10*3/uL (ref 1.7–7.7)
Neutrophils Relative %: 58 %
Platelets: 209 10*3/uL (ref 150–400)
RBC: 4.33 MIL/uL (ref 3.87–5.11)
RDW: 14.3 % (ref 11.5–15.5)
WBC: 5.3 10*3/uL (ref 4.0–10.5)
nRBC: 0 % (ref 0.0–0.2)

## 2021-08-25 MED ORDER — SODIUM CHLORIDE 0.9 % IV BOLUS
500.0000 mL | Freq: Once | INTRAVENOUS | Status: AC
  Administered 2021-08-25: 500 mL via INTRAVENOUS

## 2021-08-25 NOTE — ED Triage Notes (Signed)
Per EMS, pt coming from MD office, pt been feeling generally unwell and mild abdominal pain with more frequent bms yesterday. Per MD pt passed out lasted 2-3 minutes before she became conscious again. No complaints of pain. 160/80 P 70 R 20 RA 97% CBG 189 20 G LAC

## 2021-08-25 NOTE — ED Notes (Signed)
Unable to obtain IV, placed order for IV team

## 2021-08-25 NOTE — ED Notes (Signed)
Daughter at bedside and was giving pt water. Daughter left to charge phone

## 2021-08-25 NOTE — ED Notes (Signed)
Patient resting with eyes closed.  Respirations even and unlabored. VSS.  Will continue to monitor

## 2021-08-25 NOTE — ED Provider Notes (Signed)
[facility] DEPT Provider Note   CSN: [id number] Arrival date & time: 08/25/21  1719     History No chief complaint on file.   [name] is a 87 y.o. female.  HPI Patient presents after syncopal episode.  States she has been feeling unwell for the last couple days.  Just feeling somewhat generally bad.  States that she was at her doctor's office today.  Reportedly passed out.  Has had frequent stools.  States she was having bowel movements up to 3-5 times a day.  No abdominal pain.  States still feels little weak.  No nausea or vomiting.  No chest pain.  Has passed out like this previously.  Feeling a little off but not feeling like passing out anymore.    Past Medical History:  Diagnosis Date   Allergy to IVP dye    Angina, class I ([place]) 04/22/2015   Atherosclerotic heart disease native coronary artery w/angina pectoris ([place]) 1992   a. 1992 s/p PTCA of LAD;  b. 1998 CABG x 3; c. 07/2001 Cath: Sev LM/LAD/LCX dzs, 3/3 patent grafts; d. 11/2013 Cath: stable graft anatomy; e. 10/2016 Cath: native 3VD, VG->OM nl, LIMA->LAD nl, VG->Diag 55.   Atherosclerotic heart disease of native coronary artery with angina pectoris ([place]) 01/14/2010   Qualifier: Diagnosis of  By: Deatra Ina MD, Sandy Salaam    Atrial septal aneurysm    Barrett's esophagus 03/04/2010   Qualifier: Diagnosis of  By: Shane Crutch, Amy S    Bilateral leg edema 04/22/2015   Bradycardia, mild briefly to the 40s, asymptomatic 05/16/2013   Carotid arterial disease ([place])    a. 05/2014 Carotid U/S: No signif bilat ICA stenosis, >60% L ECA.   Chronic anemia    Chronic diastolic CHF (congestive heart failure) ([place])    a. 10/2016 Echo: Ef 55-60%, no rwma, Gr1 DD, triv AI, PASP 78mHg, atrial septal aneurysm.   Chronic diastolic CHF (congestive heart failure), NYHA class 2 ([place] 08/11/2017   Claudication ([place] 05/16/2013   Peripheral arterial occlusive disease with lifestyle limiting claudication     Degenerative joint disease 05/30/2018   DIVERTICULOSIS OF COLON 01/14/2010   Qualifier: Diagnosis of  By: KDeatra InaMD, RSandy Salaam   DM (diabetes mellitus), type 2 with peripheral vascular complications ([place]    On Oral medications.   DOE (dyspnea on exertion) 04/22/2015   Essential hypertension    FECAL INCONTINENCE 03/04/2010   Qualifier: Diagnosis of  By: ETrellis PaganiniPA-c, Amy S    GERD (gastroesophageal reflux disease)    Hyperlipidemia with target LDL less than 70    Inflammatory arthritis 10/13/2017   Neuropathy    Non-insulin dependent type 2 diabetes mellitus ([place] 10/24/2015   NSTEMI (non-ST elevated myocardial infarction) ([place] 11/01/2016   PAD (peripheral artery disease) ([place] 05/2013; 09/2013   a. severe calcified POP-1 & TP Trunk Dz bilat;  b . 05/2013 Diamondback Rot Athrectomy (R POP) --> PTA R Pop, DES to R Peroneal;  c. 09/2013 PTA/Stenting of L Pop ([name]- retrograde access);  d. 04/2014 ABI's: R/L: 1.1/1.1.   Preoperative cardiovascular examination 04/15/2020   PUD (peptic ulcer disease)    Rheumatoid arthritis ([place]    S/P CABG x 3 1998   LIMA-LAD, SVG-OM, SVG-D1   Severe claudication ([place] 05/2013   Referred for Peripheral Angio (Dr. BGwenlyn Found--> Dr. GBrunetta Jeansin CS[place]ner   [place]/01[place]2010   Qualifier: Diagnosis of  By: NHarlon DittyCMA (AAMA), [name]     Status  post total replacement of right hip 10/12/2018   Vertigo 01/02/2014    Patient Active Problem List   Diagnosis Date Noted   Chronic diastolic heart failure ([place]) 01/07/2021   Status post total hip replacement, right 12/04/2020   Status post total replacement of left hip 11/21/2020   Near syncope 07/10/2020   Hx of CABG 07/10/2020   Preoperative cardiovascular examination 04/15/2020   Chest pain 03/18/2020   Unilateral primary osteoarthritis, left hip 04/03/2019   History of right hip replacement 04/03/2019   Status post total replacement of right hip 10/12/2018   Unilateral primary  osteoarthritis, right hip 09/12/2018   Degenerative joint disease 05/30/2018   Anal fissure 11/10/2017   Rectal bleeding 11/10/2017   Rectal pain 11/10/2017   Inflammatory arthritis 10/13/2017   Latent tuberculosis by blood test 10/13/2017   Chronic diastolic CHF (congestive heart failure), NYHA class 2 ([place]) 08/11/2017   Fatigue due to treatment 01/09/2017   NSTEMI (non-ST elevated myocardial infarction) ([place]) 11/01/2016   Diabetes mellitus ([place]) 10/24/2015   Non-insulin dependent type 2 diabetes mellitus ([place]) 10/24/2015   Angina, class I ([place]) 04/22/2015   DOE (dyspnea on exertion) 04/22/2015   Bilateral leg edema 04/22/2015   Accumulation of fluid in tissues 03/21/2014   Vertigo, central 01/02/2014   Vertigo 01/02/2014   Limb pain- echymosis, pain Lt radial artery after cath 12/11/2013   Dyslipidemia, goal LDL below 70 05/17/2013   Essential hypertension 05/17/2013   Claudication ([place]) 05/16/2013   PTA/ Stent Rt popliteal and Rt peroneal artery 05/16/13 05/16/2013   PAD,severe calcified pop and tibial peroneal trunk disease bilateraly    ABDOMINAL PAIN RIGHT LOWER QUADRANT 10/07/2010   CVA-STROKE 03/04/2010   BARRETT'S ESOPHAGUS 03/04/2010   FECAL INCONTINENCE 03/04/2010   DM 01/14/2010   Anemia of chronic disease 01/14/2010   Atherosclerotic heart disease of native coronary artery with angina pectoris ([place]) 01/14/2010   DIVERTICULOSIS OF COLON 01/14/2010   PERSONAL HISTORY OF COLONIC POLYPS 01/14/2010   SHELLFISH ALLERGY 01/14/2010    Past Surgical History:  Procedure Laterality Date   ATHERECTOMY Right 05/15/2013   Procedure: ATHERECTOMY;  Surgeon: Lorretta Harp, MD;  Location: [facility] CATH LAB;  Service: Cardiovascular;  Laterality: Right;  popliteal   BACK SURGERY     CARDIAC CATHETERIZATION  07/17/01   2 v CAD with LM, circ, LAD, obtuse diag, mod stenosis of diag vein graft , mild stenosis of marg vein graft, nl EF, medical treatment   CARDIAC CATHETERIZATION  11/2013    Widely patent LIMA-LAD & SVG-OM with mild progression of proximal stenosis ~40-50% in SVG-D1; Widely patent native RCA with known severe native LCA disease   CARDIAC CATHETERIZATION N/A 11/01/2016   Procedure: Left Heart Cath and Cors/Grafts Angiography;  Surgeon: [name], MD;  Location: [facility] CV LAB;  Service: Cardiovascular: Hemodynamics: High LVEDP!.  Cors/Grafts: LM 55%, o-p LAD 100% then after D1 -> LIMA-LAD patent, SVG-Diag ~55%. small RI wiht ~70% ostial. O-p Cx 80% - pCx 70% --> SVG-bifurcating OM patent. -- med Rx   CORONARY ANGIOPLASTY  1992   PCI to LAD   CORONARY ARTERY BYPASS GRAFT  1998   LIMA-LAD, SVG-diagonal (none roughly 50% stenosis), SVG-OM   LEFT HEART CATHETERIZATION WITH CORONARY ANGIOGRAM N/A 11/27/2013   Procedure: LEFT HEART CATHETERIZATION WITH CORONARY ANGIOGRAM;  Surgeon: [name], MD;  Location: [facility] CATH LAB;  Service: Cardiovascular;  Laterality: N/A;   LOW EXTREMITY DOPPLERS/ABI  10/2016   Patent right SFA, popliteal and peroneal tendons.  Patent left SFA and popliteal stent. 30-50% stenosis in the right common femoral and popliteal arteries, 50-74% stenosis in left profunda femoris artery. --> 1 year follow-up.  RABI - 1.2, LABI 1.1   LOWER EXTREMITY ANGIOGRAM N/A 02/22/2013   Procedure: LOWER EXTREMITY ANGIOGRAM;  Surgeon: Lorretta Harp, MD;  Location: [facility] CATH LAB;  Service: Cardiovascular;  Laterality: N/A;   LOWER EXTREMITY ANGIOGRAM N/A 07/20/2013   Procedure: LOWER EXTREMITY ANGIOGRAM;  Surgeon: Lorretta Harp, MD;  Location: [facility] CATH LAB;  Service: Cardiovascular;  Laterality: N/A;   NM MYOVIEW LTD  04/03/2013; 5/26'/2016   Lexiscan: a)  Apical perfusion defect with no ischemia. Consider prior infarct versus breast attenuation.;; b) 5/'16: LOW RISK, Nl EF ~55-65%, No Ischemia /Infarction   NM MYOVIEW LTD  10/'19; 4/'21   a) LOW RISK.  EF 55%.  No ischemia or infarction. b) EF estimated 81%.  Suboptimal images.  May be subtle inferior  ischemia-initially read by radiology as intermediate risk, however overread by cardiology to be mild low risk.Marland Kitchen   PERCUTANEOUS STENT INTERVENTION Right 05/15/2013   Procedure: PERCUTANEOUS STENT INTERVENTION;  Surgeon: Lorretta Harp, MD;  Location: [facility] CATH LAB;  Service: Cardiovascular;  Laterality: Right;  tibioperoneal trunk   Peroneal Artery Stent  05/15/13   Diamondback rot. atherectomy of high grade segmental popliteal stenosis then Chocolate balloonPTA; then angiosculpt PTA of the prox peroneal with stent-Xpedition    pv angio  07/20/13   high-grade calcified popliteal disease with one-vessel runoff via a small anterior tibial artery that has proximal disease as well, no intervention   TOTAL HIP ARTHROPLASTY Right 10/12/2018   Procedure: RIGHT TOTAL HIP ARTHROPLASTY ANTERIOR APPROACH;  Surgeon: Mcarthur Rossetti, MD;  Location: [facility] ORS;  Service: Orthopedics;  Laterality: Right;   TOTAL HIP ARTHROPLASTY Left 11/21/2020   Procedure: LEFT TOTAL HIP ARTHROPLASTY ANTERIOR APPROACH;  Surgeon: Mcarthur Rossetti, MD;  Location: [facility] ORS;  Service: Orthopedics;  Laterality: Left;   TRANSTHORACIC ECHOCARDIOGRAM  11/'17; 10/19   a) EF 55-60%. Gr 1 DD. Normal PAP. Aortic Sclerosis.;; b) Normal LV size and function with vigorous EF 65 to 70%.  GR 1 DD.  Severe LA dilation (suggestive of probably worsening: GR 1 DD).   TRANSTHORACIC ECHOCARDIOGRAM  03/18/2020   EF 70-75%.  Hyperdynamic.  GR 1 DD.  No R WMA.  Mild LA dilation.  Mild aortic valve sclerosis but no stenosis.-No change from prior echo   TUBAL LIGATION       OB History   No obstetric history on file.     Family History  Adopted: Yes  Problem Relation Age of Onset   Hypertension Mother    Hyperlipidemia Mother    Hyperlipidemia Father    Hypertension Father     Social History   Tobacco Use   Smoking status: Never   Smokeless tobacco: Never  Vaping Use   Vaping Use: Never used  Substance Use Topics   Alcohol use: Not  Currently    Comment: rare glass of wine    Drug use: No    Home Medications Prior to Admission medications   Medication Sig Start Date End Date Taking? Authorizing Provider  acetaminophen (TYLENOL) 650 MG CR tablet Take 650 mg by mouth every 8 (eight) hours as needed for pain.    [provider]  acetaminophen-codeine (TYLENOL #3) 300-30 MG tablet Take 1-2 tablets by mouth every 8 (eight) hours as needed. 03/20/21   Mcarthur Rossetti, MD  amLODipine (NORVASC) 5 MG  tablet Take 1 tablet (5 mg total) by mouth daily. 08/14/21 11/12/21  Lorretta Harp, MD  carvedilol (COREG) 6.25 MG tablet TAKE 1 TABLET BY MOUTH TWICE A DAY 04/13/21   Lorretta Harp, MD  Cholecalciferol (VITAMIN D3) 10 MCG (400 UNIT) tablet Take 400 Units by mouth daily.    [provider]  clopidogrel (PLAVIX) 75 MG tablet Take 75 mg by mouth daily.    [provider]  Coenzyme Q10 (CO Q 10 PO) Take 1 tablet by mouth every other day.     [provider]  ezetimibe (ZETIA) 10 MG tablet Take 10 mg by mouth at bedtime.    [provider]  folic acid (FOLVITE) 1 MG tablet Take 1 mg by mouth daily. 08/21/18   [provider]  furosemide (LASIX) 40 MG tablet Take 40 Mg (1 tablet) daily 03/04/20   Lorretta Harp, MD  gabapentin (NEURONTIN) 400 MG capsule Take 400 mg by mouth 3 (three) times daily. 06/09/20   [provider]  glimepiride (AMARYL) 1 MG tablet Take 2 mg by mouth daily with breakfast. 05/02/18   [provider]  isosorbide mononitrate (IMDUR) 60 MG 24 hr tablet TAKE 1.5 TABLETS (90 MG TOTAL) BY MOUTH DAILY. Marland Kitchen Patient taking differently: Take 60 mg by mouth daily. . 01/19/21   Lorretta Harp, MD  methocarbamol (ROBAXIN) 500 MG tablet Take 1 tablet (500 mg total) by mouth every 8 (eight) hours as needed for muscle spasms. 01/07/21   Pete Pelt, PA-C  methotrexate (RHEUMATREX) 2.5 MG tablet Take 20 mg by mouth every Friday. (8 tablets) 08/21/18    [provider]  MULTIPLE VITAMIN-FOLIC ACID PO     [provider]  nitroGLYCERIN (NITROSTAT) 0.4 MG SL tablet PLACE 1 TABLET (0.4 MG TOTAL) UNDER THE TONGUE EVERY 5 (FIVE) MINUTES AS NEEDED FOR CHEST PAIN. 02/13/18   Theora Gianotti, NP  pantoprazole (PROTONIX) 40 MG tablet Take 1 tablet (40 mg total) by mouth 2 (two) times daily before a meal. 03/19/20   Lavina Hamman, MD  predniSONE (DELTASONE) 5 MG tablet Take 5 mg by mouth daily with breakfast.     [provider]  rosuvastatin (CRESTOR) 40 MG tablet TAKE 1 TABLET BY MOUTH EVERYDAY AT BEDTIME 01/16/21   Lorretta Harp, MD  sulfaSALAzine (AZULFIDINE) 500 MG EC tablet Take 500 mg by mouth 2 (two) times daily.    [provider]    Allergies    Fish allergy, Ivp dye [iodinated diagnostic agents], Latex, Shellfish allergy, Evolocumab, Other, and Metformin  Review of Systems   Review of Systems  Constitutional:  Positive for appetite change and fatigue.  HENT:  Negative for congestion.   Respiratory:  Negative for shortness of breath.   Cardiovascular:  Negative for chest pain.  Gastrointestinal:        Frequent stools, but not diarrhea.  Genitourinary:  Negative for hematuria.  Musculoskeletal:  Negative for back pain.  Skin:  Negative for rash.  Neurological:  Positive for syncope. Negative for weakness.  Psychiatric/Behavioral:  Negative for confusion.    Physical Exam Updated Vital Signs BP 119/63   Pulse (!) 57   Temp (!) 97.4 F (36.3 C) (Oral)   Resp 17   Ht '5\' 3"'$  (1.6 m)   Wt 71.2 kg   SpO2 92%   BMI 27.81 kg/m   Physical Exam Vitals and nursing note reviewed.  HENT:     Head: Atraumatic.  Mouth/Throat:     Mouth: Mucous membranes are moist.  Eyes:     Extraocular Movements: Extraocular movements intact.  Cardiovascular:     Rate and Rhythm: Regular rhythm.  Pulmonary:     Breath sounds: No wheezing or rhonchi.  Abdominal:     Tenderness: There is no  abdominal tenderness.  Musculoskeletal:        General: No tenderness.     Cervical back: Neck supple.  Skin:    General: Skin is warm.     Capillary Refill: Capillary refill takes less than 2 seconds.  Neurological:     Mental Status: She is alert and oriented to person, place, and time.    ED Results / Procedures / Treatments   Labs (all labs ordered are listed, but only abnormal results are displayed) Labs Reviewed  COMPREHENSIVE METABOLIC PANEL - Abnormal; Notable for the following components:      Result Value   Glucose, Bld 166 (*)    Calcium 8.0 (*)    Alkaline Phosphatase 36 (*)    All other components within normal limits  CBC WITH DIFFERENTIAL/PLATELET  URINALYSIS, ROUTINE W REFLEX MICROSCOPIC    EKG EKG Interpretation  Date/Time:  Tuesday August 25 2021 18:21:20 EDT Ventricular Rate:  58 PR Interval:  181 QRS Duration: 90 QT Interval:  459 QTC Calculation: 451 R Axis:   52 Text Interpretation: Sinus rhythm Abnormal R-wave progression, early transition Nonspecific T abnrm, anterolateral leads Confirmed by Davonna Belling [phone]) on 08/25/2021 8:31:02 PM  Radiology No results found.  Procedures Procedures   Medications Ordered in ED Medications  sodium chloride 0.9 % bolus 500 mL (0 mLs Intravenous Stopped 08/25/21 2117)    ED Course  I have reviewed the triage vital signs and the nursing notes.  Pertinent labs & imaging results that were available during my care of the patient were reviewed by me and considered in my medical decision making (see chart for details).    MDM Rules/Calculators/A&P                           Patient with syncopal episode.  Has had some GI symptoms.  Feeling better.  Reviewing records has had similar symptoms to this in the past.  EKG reassuring.  Lab work reassuring.  Feels better.  Urine had not come back but not have any urinary symptoms.  Doubt cardiac cause of ischemia.  Will discharge home with outpatient  follow-up Final Clinical Impression(s) / ED Diagnoses Final diagnoses:  Syncope, unspecified syncope type    Rx / DC Orders ED Discharge Orders     None        Davonna Belling, MD 08/26/21 0003

## 2021-08-25 NOTE — ED Notes (Signed)
Daughter at bedside.  Patient and daughter updated on plan of care.  Patient provided with warm blanket and lights dimmed for comfort

## 2021-09-07 ENCOUNTER — Encounter: Payer: Self-pay | Admitting: Rehabilitative and Restorative Service Providers"

## 2021-09-07 ENCOUNTER — Other Ambulatory Visit: Payer: Self-pay

## 2021-09-07 ENCOUNTER — Ambulatory Visit (INDEPENDENT_AMBULATORY_CARE_PROVIDER_SITE_OTHER): Payer: Medicare Other | Admitting: Rehabilitative and Restorative Service Providers"

## 2021-09-07 DIAGNOSIS — M79605 Pain in left leg: Secondary | ICD-10-CM | POA: Diagnosis not present

## 2021-09-07 DIAGNOSIS — M6281 Muscle weakness (generalized): Secondary | ICD-10-CM | POA: Diagnosis not present

## 2021-09-07 DIAGNOSIS — R262 Difficulty in walking, not elsewhere classified: Secondary | ICD-10-CM | POA: Diagnosis not present

## 2021-09-07 NOTE — Therapy (Signed)
[facility] [address] [place], [name], [address] Phone: [phone]   Fax:  [phone]  Physical Therapy Evaluation  Patient Details  Name: [name] MRN: [id number] Date of Birth: [birth year] Referring Provider (PT): Mcarthur Rossetti, MD   Encounter Date: 09/07/2021   PT End of Session - 09/07/21 1532     Visit Number 1    Number of Visits 20    Date for PT Re-Evaluation 11/16/21    Authorization Type [employer] Medicare 20% co    Progress Note Due on Visit 10    PT Start Time 1535    PT Stop Time 1600    PT Time Calculation (min) 25 min    Activity Tolerance Patient tolerated treatment well    Behavior During Therapy [facility] for tasks assessed/performed             Past Medical History:  Diagnosis Date   Allergy to IVP dye    Angina, class I ([place]) 04/22/2015   Atherosclerotic heart disease native coronary artery w/angina pectoris ([place]) 1992   a. 1992 s/p PTCA of LAD;  b. 1998 CABG x 3; c. 07/2001 Cath: Sev LM/LAD/LCX dzs, 3/3 patent grafts; d. 11/2013 Cath: stable graft anatomy; e. 10/2016 Cath: native 3VD, VG->OM nl, LIMA->LAD nl, VG->Diag 55.   Atherosclerotic heart disease of native coronary artery with angina pectoris ([place]) 01/14/2010   Qualifier: Diagnosis of  By: Deatra Ina MD, Sandy Salaam    Atrial septal aneurysm    Barrett's esophagus 03/04/2010   Qualifier: Diagnosis of  By: Shane Crutch, Amy S    Bilateral leg edema 04/22/2015   Bradycardia, mild briefly to the 40s, asymptomatic 05/16/2013   Carotid arterial disease ([place])    a. 05/2014 Carotid U/S: No signif bilat ICA stenosis, >60% L ECA.   Chronic anemia    Chronic diastolic CHF (congestive heart failure) ([place])    a. 10/2016 Echo: Ef 55-60%, no rwma, Gr1 DD, triv AI, PASP 50mmHg, atrial septal aneurysm.   Chronic diastolic CHF (congestive heart failure), NYHA class 2 ([place]) 08/11/2017   Claudication ([place]) 05/16/2013   Peripheral arterial occlusive disease with  lifestyle limiting claudication    Degenerative joint disease 05/30/2018   DIVERTICULOSIS OF COLON 01/14/2010   Qualifier: Diagnosis of  By: Deatra Ina MD, Sandy Salaam    DM (diabetes mellitus), type 2 with peripheral vascular complications ([place])    On Oral medications.   DOE (dyspnea on exertion) 04/22/2015   Essential hypertension    FECAL INCONTINENCE 03/04/2010   Qualifier: Diagnosis of  By: Trellis Paganini PA-c, Amy S    GERD (gastroesophageal reflux disease)    Hyperlipidemia with target LDL less than 70    Inflammatory arthritis 10/13/2017   Neuropathy    Non-insulin dependent type 2 diabetes mellitus ([place]) 10/24/2015   NSTEMI (non-ST elevated myocardial infarction) ([place]) 11/01/2016   PAD (peripheral artery disease) ([place]) 05/2013; 09/2013   a. severe calcified POP-1 & TP Trunk Dz bilat;  b . 05/2013 Diamondback Rot Athrectomy (R POP) --> PTA R Pop, DES to R Peroneal;  c. 09/2013 PTA/Stenting of L Pop [name] - retrograde access);  d. 04/2014 ABI's: R/L: 1.1/1.1.   Preoperative cardiovascular examination 04/15/2020   PUD (peptic ulcer disease)    Rheumatoid arthritis ([place])    S/P CABG x 3 1998   LIMA-LAD, SVG-OM, SVG-D1   Severe claudication ([place]) 05/2013   Referred for Peripheral Angio (Dr. Gwenlyn Found --> Dr. Brunetta Jeans in [place])   [place] 01/14/2010  Qualifier: Diagnosis of  By: Nelson-Smith CMA (AAMA), [name]     Status post total replacement of right hip 10/12/2018   Vertigo 01/02/2014    Past Surgical History:  Procedure Laterality Date   ATHERECTOMY Right 05/15/2013   Procedure: ATHERECTOMY;  Surgeon: Lorretta Harp, MD;  Location: [facility] CATH LAB;  Service: Cardiovascular;  Laterality: Right;  popliteal   BACK SURGERY     CARDIAC CATHETERIZATION  07/17/01   2 v CAD with LM, circ, LAD, obtuse diag, mod stenosis of diag vein graft , mild stenosis of marg vein graft, nl EF, medical treatment   CARDIAC CATHETERIZATION  11/2013   Widely patent LIMA-LAD & SVG-OM with mild progression  of proximal stenosis ~40-50% in SVG-D1; Widely patent native RCA with known severe native LCA disease   CARDIAC CATHETERIZATION N/A 11/01/2016   Procedure: Left Heart Cath and Cors/Grafts Angiography;  Surgeon: [name], MD;  Location: [place] CV LAB;  Service: Cardiovascular: Hemodynamics: High LVEDP!.  Cors/Grafts: LM 55%, o-p LAD 100% then after D1 -> LIMA-LAD patent, SVG-Diag ~55%. small RI wiht ~70% ostial. O-p Cx 80% - pCx 70% --> SVG-bifurcating OM patent. -- med Rx   CORONARY ANGIOPLASTY  1992   PCI to LAD   CORONARY ARTERY BYPASS GRAFT  1998   LIMA-LAD, SVG-diagonal (none roughly 50% stenosis), SVG-OM   LEFT HEART CATHETERIZATION WITH CORONARY ANGIOGRAM N/A 11/27/2013   Procedure: LEFT HEART CATHETERIZATION WITH CORONARY ANGIOGRAM;  Surgeon: [name], MD;  Location: [facility] [place] CATH LAB;  Service: Cardiovascular;  Laterality: N/A;   LOW EXTREMITY DOPPLERS/ABI  10/2016   Patent right SFA, popliteal and peroneal tendons. Patent left SFA and popliteal stent. 30-50% stenosis in the right common femoral and popliteal arteries, 50-74% stenosis in left profunda femoris artery. --> 1 year follow-up.  RABI - 1.2, LABI 1.1   LOWER EXTREMITY ANGIOGRAM N/A 02/22/2013   Procedure: LOWER EXTREMITY ANGIOGRAM;  Surgeon: Lorretta Harp, MD;  Location: [facility] CATH LAB;  Service: Cardiovascular;  Laterality: N/A;   LOWER EXTREMITY ANGIOGRAM N/A 07/20/2013   Procedure: LOWER EXTREMITY ANGIOGRAM;  Surgeon: Lorretta Harp, MD;  Location: [facility] CATH LAB;  Service: Cardiovascular;  Laterality: N/A;   NM MYOVIEW LTD  04/03/2013; 5/26'/2016   Lexiscan: a)  Apical perfusion defect with no ischemia. Consider prior infarct versus breast attenuation.;; b) 5/'16: LOW RISK, Nl EF ~55-65%, No Ischemia /Infarction   NM MYOVIEW LTD  10/'19; 4/'21   a) LOW RISK.  EF 55%.  No ischemia or infarction. b) EF estimated 81%.  Suboptimal images.  May be subtle inferior ischemia-initially read by radiology as intermediate risk,  however overread by cardiology to be mild low risk.Marland Kitchen   PERCUTANEOUS STENT INTERVENTION Right 05/15/2013   Procedure: PERCUTANEOUS STENT INTERVENTION;  Surgeon: Lorretta Harp, MD;  Location: [facility] CATH LAB;  Service: Cardiovascular;  Laterality: Right;  tibioperoneal trunk   Peroneal Artery Stent  05/15/13   Diamondback rot. atherectomy of high grade segmental popliteal stenosis then Chocolate balloonPTA; then angiosculpt PTA of the prox peroneal with stent-Xpedition    pv angio  07/20/13   high-grade calcified popliteal disease with one-vessel runoff via a small anterior tibial artery that has proximal disease as well, no intervention   TOTAL HIP ARTHROPLASTY Right 10/12/2018   Procedure: RIGHT TOTAL HIP ARTHROPLASTY ANTERIOR APPROACH;  Surgeon: Mcarthur Rossetti, MD;  Location: [facility] ORS;  Service: Orthopedics;  Laterality: Right;   TOTAL HIP ARTHROPLASTY Left 11/21/2020   Procedure: LEFT TOTAL HIP ARTHROPLASTY ANTERIOR  APPROACH;  Surgeon: Mcarthur Rossetti, MD;  Location: [facility] ORS;  Service: Orthopedics;  Laterality: Left;   TRANSTHORACIC ECHOCARDIOGRAM  11/'17; 10/19   a) EF 55-60%. Gr 1 DD. Normal PAP. Aortic Sclerosis.;; b) Normal LV size and function with vigorous EF 65 to 70%.  GR 1 DD.  Severe LA dilation (suggestive of probably worsening: GR 1 DD).   TRANSTHORACIC ECHOCARDIOGRAM  03/18/2020   EF 70-75%.  Hyperdynamic.  GR 1 DD.  No R WMA.  Mild LA dilation.  Mild aortic valve sclerosis but no stenosis.-No change from prior echo   TUBAL LIGATION      There were no vitals filed for this visit.    Subjective Assessment - 09/07/21 1531     Subjective History of Lt hip replacement Dec 2021.  Complaints of Lt hip/knee leg pain (referral for IT band syndrome, trochanteric bursitis).  Pt. stated walking makes her legs give out, limited walking to mailbox.  Pt. stated Lt leg more but both get tired.  Pt. stated she tries to do without cane but does use it in Rt hand at times.  Pt. some  trouble sleeping at times due to pain.    Pertinent History PMH: CAD s/p CABG, CHF, PAD with claudication, DM with neuropathy, HTN, HLD, NSTEMI, RA, bil THA, hx back surgery    Limitations Walking;Standing    How long can you walk comfortably? to mailbox    Patient Stated Goals Reduce pain, walk better    Currently in Pain? Yes    Pain Score 5     Pain Location Leg    Pain Orientation Left   both noted for fatigue   Pain Descriptors / Indicators Cramping;Throbbing;Tingling;Numbness    Pain Type Chronic pain    Pain Radiating Towards numbness in Lt thigh    Pain Onset More than a month ago    Pain Frequency Intermittent    Aggravating Factors  prolonged walking, tender to touch    Pain Relieving Factors rest and rubbing leg                [facility] PT Assessment - 09/07/21 0001       Assessment   Medical Diagnosis Z96.642 (ICD-10-CM) - History of left hip replacement    Referring Provider (PT) Mcarthur Rossetti, MD    Onset Date/Surgical Date 11/21/20    Hand Dominance Right      Precautions   Precautions None      Balance Screen   Has the patient fallen in the past 6 months No    Has the patient had a decrease in activity level because of a fear of falling?  No    Is the patient reluctant to leave their home because of a fear of falling?  No      Home Environment   Living Environment Private residence    Living Arrangements Alone    Additional Comments Has front step but has a ramp. No stairs in house      Prior Function   Vocation Retired    Leisure Walk to [employer], walking level around house, word puzzles      Observation/Other Assessments   Focus on Therapeutic Outcomes (FOTO)  intake 47%, predicted 60%      Functional Tests   Functional tests Sit to Stand      Sit to Stand   Comments 18 inch chair no UE several tries to succeed.      ROM / Strength   AROM /  PROM / Strength Strength;PROM;AROM      AROM   Overall AROM Comments Will plan future full  assessment on later visit    AROM Assessment Site Lumbar;Hip    Right/Left Hip Left;Right      Strength   Overall Strength Comments Isometric seated hip abd, add strong and painless (held official MMT due to time and cramping complaints)    Strength Assessment Site Hip;Knee    Right/Left Hip Right;Left    Right Hip Flexion 4/5    Left Hip Flexion 4/5    Right/Left Knee Left;Right    Right Knee Flexion 5/5    Right Knee Extension 5/5    Left Knee Flexion 4/5    Left Knee Extension 4/5      Palpation   Palpation comment Severe tenderness to touch in Lt lateral hip/thigh, anterior thigh and lateral Lt knee.  Trigger points noted in Lt glute med/min, anterior/lateral quadriceps, and tenderness along length of IT band Lt      Transfers   Five time sit to stand comments  61 seconds   18 inch chair s UE assist     Ambulation/Gait   Gait Comments SPC use in Rt UE, ocassional reduced stance on Lt                        Objective measurements completed on examination: See above findings.       [facility] PT Treatment/Exercise - 09/07/21 0001       Self-Care   Self-Care Other Self-Care Comments    Other Self-Care Comments  Self care instruction given regarding plan of care, recommendation to avoid UE use in transfers when able for strengthening, walking for aerobic intervention and exercise to tolerance.  Additional time spent in verbal review to ensure knowledge.  Discussion on use of voltaren gel as instructed.      Exercises   Exercises Other Exercises;Knee/Hip    Other Exercises  --                     PT Education - 09/07/21 1533     Education Details see self care    Person(s) Educated Patient    Methods Explanation;Verbal cues    Comprehension Verbalized understanding              PT Short Term Goals - 09/07/21 1532       PT SHORT TERM GOAL #1   Title Patient will demonstrate independent use of home exercise program to maintain  progress from in clinic treatments.    Time 3    Period Weeks    Status New    Target Date 09/28/21               PT Long Term Goals - 09/07/21 1532       PT LONG TERM GOAL #1   Title Patient will demonstrate/report pain at worst less than or equal to 2/10 to facilitate minimal limitation in daily activity secondary to pain symptoms.    Time 10    Period Weeks    Status New    Target Date 11/16/21      PT LONG TERM GOAL #2   Title Patient will demonstrate independent use of home exercise program to facilitate ability to maintain/progress functional gains from skilled physical therapy services.    Time 10    Period Weeks    Status New    Target Date 11/16/21  PT LONG TERM GOAL #3   Title Pt. will demonstrate FOTO outcome > or = 60% to indicated reduced disability due to condition.    Time 10    Period Weeks    Status New    Target Date 11/16/21      PT LONG TERM GOAL #4   Title Pt. will demonstrate 5x sit to stand < 20 seconds to facilitate reduction in fall risk and improved functional mobility capability.    Time 10    Period Weeks    Status New    Target Date 11/16/21      PT LONG TERM GOAL #5   Title Pt. will demonstrate ability to walk independently > 500 ft to facilitate community ambulation, mailbox ambulation s fatigue.    Time 10    Period Weeks    Status New    Target Date 11/16/21      Additional Long Term Goals   Additional Long Term Goals Yes      PT LONG TERM GOAL #6   Title Pt. will demonstrate bilateral hip MMT > = 4/5, knee 5/5 bilateral to facilitate ability to perform walking, transfers at [facility].    Time 10    Period Weeks    Status New    Target Date 11/16/21                    Plan - 09/07/21 1533     Clinical Impression Statement Patient is a 87 y.o. who comes to clinic with complaints of Lt hip/knee/leg pain with mobility, strength and movement coordination deficits that impair their ability to perform usual daily and  recreational functional activities without increase difficulty/symptoms at this time.  Patient to benefit from skilled PT services to address impairments and limitations to improve to previous level of function without restriction secondary to condition.    Personal Factors and Comorbidities Comorbidity 3+    Comorbidities PMH: CAD s/p CABG, CHF, PAD with claudication, DM with neuropathy, HTN, HLD, NSTEMI, RA, bil THA, hx back surgery    Examination-Activity Limitations Sleep;Squat;Bend;Stairs;Stand;Carry;Transfers;Lift;Locomotion Level    Examination-Participation Restrictions Cleaning;Community Activity;Interpersonal Relationship;Meal Prep    Stability/Clinical Decision Making Evolving/Moderate complexity    Clinical Decision Making Moderate    Rehab Potential Fair   fair to good   PT Frequency 2x / week    PT Duration Other (comment)   10 weeks   PT Treatment/Interventions ADLs/Self Care Home Management;Cryotherapy;Electrical Stimulation;Iontophoresis 4mg /ml Dexamethasone;Moist Heat;Balance training;Therapeutic exercise;Therapeutic activities;Functional mobility training;Stair training;Gait training;DME Instruction;Neuromuscular re-education;Patient/family education;Passive range of motion;Spinal Manipulations;Joint Manipulations;Dry needling;Taping;Manual techniques    PT Next Visit Plan Progressive strengthening and endurance for lower extremity. .    PT Home Exercise Plan Not established today in medbridge (self care only)    Consulted and Agree with Plan of Care Patient             Patient will benefit from skilled therapeutic intervention in order to improve the following deficits and impairments:  Abnormal gait, Hypomobility, Pain, Decreased strength, Decreased activity tolerance, Decreased mobility, Difficulty walking, Decreased balance, Decreased range of motion, Decreased coordination, Postural dysfunction, Impaired perceived functional ability, Improper body mechanics, Decreased  endurance  Visit Diagnosis: Pain in left leg  Muscle weakness (generalized)  Difficulty in walking, not elsewhere classified     Problem List Patient Active Problem List   Diagnosis Date Noted   Chronic diastolic heart failure ([place]) 01/07/2021   Status post total hip replacement, right 12/04/2020   Status post total replacement  of left hip 11/21/2020   Near syncope 07/10/2020   Hx of CABG 07/10/2020   Preoperative cardiovascular examination 04/15/2020   Chest pain 03/18/2020   Unilateral primary osteoarthritis, left hip 04/03/2019   History of right hip replacement 04/03/2019   Status post total replacement of right hip 10/12/2018   Unilateral primary osteoarthritis, right hip 09/12/2018   Degenerative joint disease 05/30/2018   Anal fissure 11/10/2017   Rectal bleeding 11/10/2017   Rectal pain 11/10/2017   Inflammatory arthritis 10/13/2017   Latent tuberculosis by blood test 10/13/2017   Chronic diastolic CHF (congestive heart failure), NYHA class 2 ([place]) 08/11/2017   Fatigue due to treatment 01/09/2017   NSTEMI (non-ST elevated myocardial infarction) ([place]) 11/01/2016   Diabetes mellitus ([place]) 10/24/2015   Non-insulin dependent type 2 diabetes mellitus ([place]) 10/24/2015   Angina, class I ([place]) 04/22/2015   DOE (dyspnea on exertion) 04/22/2015   Bilateral leg edema 04/22/2015   Accumulation of fluid in tissues 03/21/2014   Vertigo, central 01/02/2014   Vertigo 01/02/2014   Limb pain- echymosis, pain Lt radial artery after cath 12/11/2013   Dyslipidemia, goal LDL below 70 05/17/2013   Essential hypertension 05/17/2013   Claudication ([place]) 05/16/2013   PTA/ Stent Rt popliteal and Rt peroneal artery 05/16/13 05/16/2013   PAD,severe calcified pop and tibial peroneal trunk disease bilateraly    ABDOMINAL PAIN RIGHT LOWER QUADRANT 10/07/2010   CVA-STROKE 03/04/2010   BARRETT'S ESOPHAGUS 03/04/2010   FECAL INCONTINENCE 03/04/2010   DM 01/14/2010   Anemia of chronic disease  01/14/2010   Atherosclerotic heart disease of native coronary artery with angina pectoris ([place]) 01/14/2010   DIVERTICULOSIS OF COLON 01/14/2010   PERSONAL HISTORY OF COLONIC POLYPS 01/14/2010   SHELLFISH ALLERGY 01/14/2010   Scot Jun, PT, DPT, OCS, ATC 09/07/21  4:28 PM    [facility] [address] [place], [name], [address] Phone: [phone]   Fax:  [phone]  Name: [name] MRN: [id number] Date of Birth: Jul 18, 1933

## 2021-09-15 ENCOUNTER — Ambulatory Visit: Payer: Medicare Other

## 2021-09-15 NOTE — Progress Notes (Deleted)
09/15/2021 [name] [birth year] [id number]   HPI:  [name] is a 87 y.o. female patient of Dr Gwenlyn Found, with a PMH below who presents today for hypertension clinic evaluation.  Past Medical History:                   Blood Pressure Goal:  130/80  Current Medications:  Family Hx:  Social Hx:   Diet:   Exercise:   Home BP readings:   Intolerances:   Labs:    Wt Readings from Last 3 Encounters:  08/25/21 156 lb 15.8 oz (71.2 kg)  08/14/21 157 lb (71.2 kg)  08/11/21 154 lb 8 oz (70.1 kg)   BP Readings from Last 3 Encounters:  08/25/21 (!) 182/75  08/14/21 (!) 185/84  08/11/21 130/75   Pulse Readings from Last 3 Encounters:  08/25/21 (!) 50  08/14/21 71  08/11/21 89    Current Outpatient Medications  Medication Sig Dispense Refill   acetaminophen (TYLENOL) 650 MG CR tablet Take 650 mg by mouth every 8 (eight) hours as needed for pain.     acetaminophen-codeine (TYLENOL #3) 300-30 MG tablet Take 1-2 tablets by mouth every 8 (eight) hours as needed. 30 tablet 0   amLODipine (NORVASC) 5 MG tablet Take 1 tablet (5 mg total) by mouth daily. 90 tablet 3   carvedilol (COREG) 6.25 MG tablet TAKE 1 TABLET BY MOUTH TWICE A DAY 180 tablet 0   Cholecalciferol (VITAMIN D3) 10 MCG (400 UNIT) tablet Take 400 Units by mouth daily.     clopidogrel (PLAVIX) 75 MG tablet Take 75 mg by mouth daily.     Coenzyme Q10 (CO Q 10 PO) Take 1 tablet by mouth every other day.      ezetimibe (ZETIA) 10 MG tablet Take 10 mg by mouth at bedtime.     folic acid (FOLVITE) 1 MG tablet Take 1 mg by mouth daily.  1   furosemide (LASIX) 40 MG tablet Take 40 Mg (1 tablet) daily 90 tablet 1   gabapentin (NEURONTIN) 400 MG capsule Take 400 mg by mouth 3 (three) times daily.     glimepiride (AMARYL) 1 MG tablet Take 2 mg by mouth daily with breakfast.  12   isosorbide mononitrate (IMDUR) 60 MG 24 hr tablet TAKE 1.5 TABLETS (90 MG TOTAL) BY MOUTH DAILY. . (Patient taking differently: Take  60 mg by mouth daily. Marland Kitchen) 135 tablet 3   methocarbamol (ROBAXIN) 500 MG tablet Take 1 tablet (500 mg total) by mouth every 8 (eight) hours as needed for muscle spasms. 30 tablet 0   methotrexate (RHEUMATREX) 2.5 MG tablet Take 20 mg by mouth every Friday. (8 tablets)  1   MULTIPLE VITAMIN-FOLIC ACID PO      nitroGLYCERIN (NITROSTAT) 0.4 MG SL tablet PLACE 1 TABLET (0.4 MG TOTAL) UNDER THE TONGUE EVERY 5 (FIVE) MINUTES AS NEEDED FOR CHEST PAIN. 25 tablet 6   pantoprazole (PROTONIX) 40 MG tablet Take 1 tablet (40 mg total) by mouth 2 (two) times daily before a meal. 60 tablet 1   predniSONE (DELTASONE) 5 MG tablet Take 5 mg by mouth daily with breakfast.      rosuvastatin (CRESTOR) 40 MG tablet TAKE 1 TABLET BY MOUTH EVERYDAY AT BEDTIME 90 tablet 2   sulfaSALAzine (AZULFIDINE) 500 MG EC tablet Take 500 mg by mouth 2 (two) times daily.     No current facility-administered medications for this visit.    Allergies  Allergen Reactions  Fish Allergy Hives and Shortness Of Breath   Ivp Dye [Iodinated Diagnostic Agents] Shortness Of Breath and Anaphylaxis   Latex Anaphylaxis, Hives, Shortness Of Breath, Itching and Other (See Comments)    REACTION: wheezing   Shellfish Allergy Anaphylaxis, Hives, Shortness Of Breath and Other (See Comments)    All seafood   Evolocumab     Other reaction(s): latex on syringe   Other Itching and Other (See Comments)    Patient reports allergy to perfumed detergents. Causes itching.   Metformin Nausea Only and Nausea And Vomiting    Past Medical History:  Diagnosis Date   Allergy to IVP dye    Angina, class I ([place]) 04/22/2015   Atherosclerotic heart disease native coronary artery w/angina pectoris ([place]) 1992   a. 1992 s/p PTCA of LAD;  b. 1998 CABG x 3; c. 07/2001 Cath: Sev LM/LAD/LCX dzs, 3/3 patent grafts; d. 11/2013 Cath: stable graft anatomy; e. 10/2016 Cath: native 3VD, VG->OM nl, LIMA->LAD nl, VG->Diag 55.   Atherosclerotic heart disease of native coronary  artery with angina pectoris ([place]) 01/14/2010   Qualifier: Diagnosis of  By: Deatra Ina MD, Sandy Salaam    Atrial septal aneurysm    Barrett's esophagus 03/04/2010   Qualifier: Diagnosis of  By: Shane Crutch, Amy S    Bilateral leg edema 04/22/2015   Bradycardia, mild briefly to the 40s, asymptomatic 05/16/2013   Carotid arterial disease ([place])    a. 05/2014 Carotid U/S: No signif bilat ICA stenosis, >60% L ECA.   Chronic anemia    Chronic diastolic CHF (congestive heart failure) ([place])    a. 10/2016 Echo: Ef 55-60%, no rwma, Gr1 DD, triv AI, PASP 39mmHg, atrial septal aneurysm.   Chronic diastolic CHF (congestive heart failure), NYHA class 2 ([place]) 08/11/2017   Claudication ([place]) 05/16/2013   Peripheral arterial occlusive disease with lifestyle limiting claudication    Degenerative joint disease 05/30/2018   DIVERTICULOSIS OF COLON 01/14/2010   Qualifier: Diagnosis of  By: Deatra Ina MD, Sandy Salaam    DM (diabetes mellitus), type 2 with peripheral vascular complications ([place])    On Oral medications.   DOE (dyspnea on exertion) 04/22/2015   Essential hypertension    FECAL INCONTINENCE 03/04/2010   Qualifier: Diagnosis of  By: Trellis Paganini PA-c, Amy S    GERD (gastroesophageal reflux disease)    Hyperlipidemia with target LDL less than 70    Inflammatory arthritis 10/13/2017   Neuropathy    Non-insulin dependent type 2 diabetes mellitus ([place]) 10/24/2015   NSTEMI (non-ST elevated myocardial infarction) ([place]) 11/01/2016   PAD (peripheral artery disease) ([place]) 05/2013; 09/2013   a. severe calcified POP-1 & TP Trunk Dz bilat;  b . 05/2013 [place] Rot Athrectomy (R POP) --> PTA R Pop, DES to R Peroneal;  c. 09/2013 PTA/Stenting of L Pop [name] - retrograde access);  d. 04/2014 ABI's: R/L: 1.1/1.1.   Preoperative cardiovascular examination 04/15/2020   PUD (peptic ulcer disease)    Rheumatoid arthritis ([place])    S/P CABG x 3 1998   LIMA-LAD, SVG-OM, SVG-D1   Severe claudication ([place]) 05/2013    Referred for Peripheral Angio (Dr. Gwenlyn Found --> Dr. Brunetta Jeans in [place])   [place] 01/14/2010   Qualifier: Diagnosis of  By: Harlon Ditty CMA ([place]), [name]     Status post total replacement of right hip 10/12/2018   Vertigo 01/02/2014    There were no vitals taken for this visit.  No problem-specific Assessment & Plan notes found for this encounter.   Tommy Medal  PharmD CPP [facility] [address] [place], [place] [address] [phone]

## 2021-09-16 ENCOUNTER — Ambulatory Visit: Payer: Medicare Other | Admitting: Orthopaedic Surgery

## 2021-09-17 ENCOUNTER — Encounter: Payer: Medicare Other | Admitting: Rehabilitative and Restorative Service Providers"

## 2021-09-17 ENCOUNTER — Telehealth: Payer: Self-pay | Admitting: Rehabilitative and Restorative Service Providers"

## 2021-09-17 NOTE — Telephone Encounter (Signed)
Pt. Indicated she called to cancel appointment today due to pressure being high.  Reminded her about last visit.   Scot Jun, PT, DPT, OCS, ATC 09/17/21  12:08 PM

## 2021-09-21 ENCOUNTER — Other Ambulatory Visit: Payer: Self-pay

## 2021-09-21 ENCOUNTER — Telehealth: Payer: Self-pay | Admitting: Rehabilitative and Restorative Service Providers"

## 2021-09-21 ENCOUNTER — Encounter: Payer: Medicare Other | Admitting: Rehabilitative and Restorative Service Providers"

## 2021-09-21 NOTE — Telephone Encounter (Signed)
Called and left message about missed visit today after 16 mins no show.  Last appointment was cancelled due to blood pressure concerns.  Today was first no show of treatment cycle.  Informed about next appointment time and requested a phone call return to confirm desire to attend.  Explained attendance policy to cancel future appointments after next visit if no show.   Scot Jun, PT, DPT, OCS, ATC 09/21/21  2:04 PM

## 2021-09-23 ENCOUNTER — Emergency Department (HOSPITAL_COMMUNITY): Payer: Medicare Other

## 2021-09-23 ENCOUNTER — Other Ambulatory Visit: Payer: Self-pay

## 2021-09-23 ENCOUNTER — Emergency Department (HOSPITAL_COMMUNITY)
Admission: EM | Admit: 2021-09-23 | Discharge: 2021-09-23 | Disposition: A | Discharge status: Home / Self Care | Payer: Medicare Other | Attending: Emergency Medicine | Admitting: Emergency Medicine

## 2021-09-23 ENCOUNTER — Encounter: Payer: Medicare Other | Admitting: Rehabilitative and Restorative Service Providers"

## 2021-09-23 DIAGNOSIS — Z951 Presence of aortocoronary bypass graft: Secondary | ICD-10-CM | POA: Diagnosis not present

## 2021-09-23 DIAGNOSIS — I5032 Chronic diastolic (congestive) heart failure: Secondary | ICD-10-CM | POA: Insufficient documentation

## 2021-09-23 DIAGNOSIS — I959 Hypotension, unspecified: Secondary | ICD-10-CM | POA: Diagnosis not present

## 2021-09-23 DIAGNOSIS — E860 Dehydration: Secondary | ICD-10-CM | POA: Diagnosis not present

## 2021-09-23 DIAGNOSIS — I2510 Atherosclerotic heart disease of native coronary artery without angina pectoris: Secondary | ICD-10-CM | POA: Diagnosis not present

## 2021-09-23 DIAGNOSIS — Z7984 Long term (current) use of oral hypoglycemic drugs: Secondary | ICD-10-CM | POA: Diagnosis not present

## 2021-09-23 DIAGNOSIS — R4020 Unspecified coma: Secondary | ICD-10-CM

## 2021-09-23 DIAGNOSIS — Z96643 Presence of artificial hip joint, bilateral: Secondary | ICD-10-CM | POA: Diagnosis not present

## 2021-09-23 DIAGNOSIS — I110 Hypertensive heart disease with heart failure: Secondary | ICD-10-CM | POA: Insufficient documentation

## 2021-09-23 DIAGNOSIS — Z7902 Long term (current) use of antithrombotics/antiplatelets: Secondary | ICD-10-CM | POA: Diagnosis not present

## 2021-09-23 DIAGNOSIS — E119 Type 2 diabetes mellitus without complications: Secondary | ICD-10-CM | POA: Insufficient documentation

## 2021-09-23 DIAGNOSIS — R55 Syncope and collapse: Secondary | ICD-10-CM | POA: Insufficient documentation

## 2021-09-23 DIAGNOSIS — Z91040 Latex allergy status: Secondary | ICD-10-CM | POA: Diagnosis not present

## 2021-09-23 LAB — COMPREHENSIVE METABOLIC PANEL
ALT: 24 U/L (ref 0–44)
AST: 45 U/L — ABNORMAL HIGH (ref 15–41)
Albumin: 3.3 g/dL — ABNORMAL LOW (ref 3.5–5.0)
Alkaline Phosphatase: 35 U/L — ABNORMAL LOW (ref 38–126)
Anion gap: 9 (ref 5–15)
BUN: 15 mg/dL (ref 8–23)
CO2: 27 mmol/L (ref 22–32)
Calcium: 8.4 mg/dL — ABNORMAL LOW (ref 8.9–10.3)
Chloride: 96 mmol/L — ABNORMAL LOW (ref 98–111)
Creatinine, Ser: 1.15 mg/dL — ABNORMAL HIGH (ref 0.44–1.00)
GFR, Estimated: 47 mL/min — ABNORMAL LOW (ref >60)
Glucose, Bld: 205 mg/dL — ABNORMAL HIGH (ref 70–99)
Potassium: 5 mmol/L (ref 3.5–5.1)
Sodium: 132 mmol/L — ABNORMAL LOW (ref 135–145)
Total Bilirubin: 0.5 mg/dL (ref 0.3–1.2)
Total Protein: 6.3 g/dL — ABNORMAL LOW (ref 6.5–8.1)

## 2021-09-23 LAB — CBG MONITORING, ED: Glucose-Capillary: 177 mg/dL — ABNORMAL HIGH (ref 70–99)

## 2021-09-23 LAB — CBC WITH DIFFERENTIAL/PLATELET
Abs Immature Granulocytes: 0.02 10*3/uL (ref 0.00–0.07)
Basophils Absolute: 0 10*3/uL (ref 0.0–0.1)
Basophils Relative: 0 %
Eosinophils Absolute: 0.1 10*3/uL (ref 0.0–0.5)
Eosinophils Relative: 1 %
HCT: 37.7 % (ref 36.0–46.0)
Hemoglobin: 11.5 g/dL — ABNORMAL LOW (ref 12.0–15.0)
Immature Granulocytes: 0 %
Lymphocytes Relative: 14 %
Lymphs Abs: 1.1 10*3/uL (ref 0.7–4.0)
MCH: 27.9 pg (ref 26.0–34.0)
MCHC: 30.5 g/dL (ref 30.0–36.0)
MCV: 91.5 fL (ref 80.0–100.0)
Monocytes Absolute: 0.6 10*3/uL (ref 0.1–1.0)
Monocytes Relative: 8 %
Neutro Abs: 6 10*3/uL (ref 1.7–7.7)
Neutrophils Relative %: 77 %
Platelets: 231 10*3/uL (ref 150–400)
RBC: 4.12 MIL/uL (ref 3.87–5.11)
RDW: 14.5 % (ref 11.5–15.5)
WBC: 7.8 10*3/uL (ref 4.0–10.5)
nRBC: 0 % (ref 0.0–0.2)

## 2021-09-23 MED ORDER — SODIUM CHLORIDE 0.9 % IV SOLN: INTRAVENOUS | Status: DC

## 2021-09-23 NOTE — ED Notes (Addendum)
Pt reporting mild headache, no other complaints. Daughter at bedside.

## 2021-09-23 NOTE — ED Provider Notes (Signed)
[facility] EMERGENCY DEPARTMENT Provider Note   CSN: [id number] Arrival date & time: 09/23/21  1638     History Chief Complaint  Patient presents with   Loss of Consciousness    [name] is a 87 y.o. female.  HPI Patient presents after episode of possible syncope versus falling asleep versus altered mental status.  Currently she denies pain, discomfort, nausea, fever, dyspnea.  She does acknowledge multiple medical problems, including diabetes.  She states that she has been taking her medication as directed.  Seemingly the patient was found either having passed out or sleep on the couch.  She was slow to respond, was not conscious for about 2 minutes.  EMS reports that on arrival the patient was hypotensive, 90 systolic.  This improved to normotensive with 500 mL normal saline.     Past Medical History:  Diagnosis Date   Allergy to IVP dye    Angina, class I ([place]) 04/22/2015   Atherosclerotic heart disease native coronary artery w/angina pectoris ([place]) 1992   a. 1992 s/p PTCA of LAD;  b. 1998 CABG x 3; c. 07/2001 Cath: Sev LM/LAD/LCX dzs, 3/3 patent grafts; d. 11/2013 Cath: stable graft anatomy; e. 10/2016 Cath: native 3VD, VG->OM nl, LIMA->LAD nl, VG->Diag 55.   Atherosclerotic heart disease of native coronary artery with angina pectoris ([place]) 01/14/2010   Qualifier: Diagnosis of  By: Deatra Ina MD, Sandy Salaam    Atrial septal aneurysm    Barrett's esophagus 03/04/2010   Qualifier: Diagnosis of  By: Shane Crutch, Amy S    Bilateral leg edema 04/22/2015   Bradycardia, mild briefly to the 40s, asymptomatic 05/16/2013   Carotid arterial disease ([place])    a. 05/2014 Carotid U/S: No signif bilat ICA stenosis, >60% L ECA.   Chronic anemia    Chronic diastolic CHF (congestive heart failure) ([place])    a. 10/2016 Echo: Ef 55-60%, no rwma, Gr1 DD, triv AI, PASP 21mmHg, atrial septal aneurysm.   Chronic diastolic CHF (congestive heart failure), NYHA class 2 ([place])  08/11/2017   Claudication ([place]) 05/16/2013   Peripheral arterial occlusive disease with lifestyle limiting claudication    Degenerative joint disease 05/30/2018   DIVERTICULOSIS OF COLON 01/14/2010   Qualifier: Diagnosis of  By: Deatra Ina MD, Sandy Salaam    DM (diabetes mellitus), type 2 with peripheral vascular complications ([place])    On Oral medications.   DOE (dyspnea on exertion) 04/22/2015   Essential hypertension    FECAL INCONTINENCE 03/04/2010   Qualifier: Diagnosis of  By: Trellis Paganini PA-c, Amy S    GERD (gastroesophageal reflux disease)    Hyperlipidemia with target LDL less than 70    Inflammatory arthritis 10/13/2017   Neuropathy    Non-insulin dependent type 2 diabetes mellitus ([place]) 10/24/2015   NSTEMI (non-ST elevated myocardial infarction) ([place]) 11/01/2016   PAD (peripheral artery disease) ([place]) 05/2013; 09/2013   a. severe calcified POP-1 & TP Trunk Dz bilat;  b . 05/2013 Diamondback Rot Athrectomy (R POP) --> PTA R Pop, DES to R Peroneal;  c. 09/2013 PTA/Stenting of L Pop [name] - retrograde access);  d. 04/2014 ABI's: R/L: 1.1/1.1.   Preoperative cardiovascular examination 04/15/2020   PUD (peptic ulcer disease)    Rheumatoid arthritis ([place])    S/P CABG x 3 1998   LIMA-LAD, SVG-OM, SVG-D1   Severe claudication ([place]) 05/2013   Referred for Peripheral Angio (Dr. Gwenlyn Found --> Dr. Brunetta Jeans in [place])   [place] 01/14/2010   Qualifier: Diagnosis of  ByHarlon Ditty CMA (AAMA), [name]     Status post total replacement of right hip 10/12/2018   Vertigo 01/02/2014    Patient Active Problem List   Diagnosis Date Noted   Chronic diastolic heart failure ([place]) 01/07/2021   Status post total hip replacement, right 12/04/2020   Status post total replacement of left hip 11/21/2020   Near syncope 07/10/2020   Hx of CABG 07/10/2020   Preoperative cardiovascular examination 04/15/2020   Chest pain 03/18/2020   Unilateral primary osteoarthritis, left hip 04/03/2019    History of right hip replacement 04/03/2019   Status post total replacement of right hip 10/12/2018   Unilateral primary osteoarthritis, right hip 09/12/2018   Degenerative joint disease 05/30/2018   Anal fissure 11/10/2017   Rectal bleeding 11/10/2017   Rectal pain 11/10/2017   Inflammatory arthritis 10/13/2017   Latent tuberculosis by blood test 10/13/2017   Chronic diastolic CHF (congestive heart failure), NYHA class 2 ([place]) 08/11/2017   Fatigue due to treatment 01/09/2017   NSTEMI (non-ST elevated myocardial infarction) ([place]) 11/01/2016   Diabetes mellitus ([place]) 10/24/2015   Non-insulin dependent type 2 diabetes mellitus ([place]) 10/24/2015   Angina, class I ([place]) 04/22/2015   DOE (dyspnea on exertion) 04/22/2015   Bilateral leg edema 04/22/2015   Accumulation of fluid in tissues 03/21/2014   Vertigo, central 01/02/2014   Vertigo 01/02/2014   Limb pain- echymosis, pain Lt radial artery after cath 12/11/2013   Dyslipidemia, goal LDL below 70 05/17/2013   Essential hypertension 05/17/2013   Claudication ([place]) 05/16/2013   PTA/ Stent Rt popliteal and Rt peroneal artery 05/16/13 05/16/2013   PAD,severe calcified pop and tibial peroneal trunk disease bilateraly    ABDOMINAL PAIN RIGHT LOWER QUADRANT 10/07/2010   CVA-STROKE 03/04/2010   BARRETT'S ESOPHAGUS 03/04/2010   FECAL INCONTINENCE 03/04/2010   DM 01/14/2010   Anemia of chronic disease 01/14/2010   Atherosclerotic heart disease of native coronary artery with angina pectoris ([place]) 01/14/2010   DIVERTICULOSIS OF COLON 01/14/2010   PERSONAL HISTORY OF COLONIC POLYPS 01/14/2010   SHELLFISH ALLERGY 01/14/2010    Past Surgical History:  Procedure Laterality Date   ATHERECTOMY Right 05/15/2013   Procedure: ATHERECTOMY;  Surgeon: Lorretta Harp, MD;  Location: [facility] CATH LAB;  Service: Cardiovascular;  Laterality: Right;  popliteal   BACK SURGERY     CARDIAC CATHETERIZATION  07/17/01   2 v CAD with LM, circ, LAD, obtuse diag, mod  stenosis of diag vein graft , mild stenosis of marg vein graft, nl EF, medical treatment   CARDIAC CATHETERIZATION  11/2013   Widely patent LIMA-LAD & SVG-OM with mild progression of proximal stenosis ~40-50% in SVG-D1; Widely patent native RCA with known severe native LCA disease   CARDIAC CATHETERIZATION N/A 11/01/2016   Procedure: Left Heart Cath and Cors/Grafts Angiography;  Surgeon: [name], MD;  Location: [facility] CV LAB;  Service: Cardiovascular: Hemodynamics: High LVEDP!.  Cors/Grafts: LM 55%, o-p LAD 100% then after D1 -> LIMA-LAD patent, SVG-Diag ~55%. small RI wiht ~70% ostial. O-p Cx 80% - pCx 70% --> SVG-bifurcating OM patent. -- med Rx   CORONARY ANGIOPLASTY  1992   PCI to LAD   CORONARY ARTERY BYPASS GRAFT  1998   LIMA-LAD, SVG-diagonal (none roughly 50% stenosis), SVG-OM   LEFT HEART CATHETERIZATION WITH CORONARY ANGIOGRAM N/A 11/27/2013   Procedure: LEFT HEART CATHETERIZATION WITH CORONARY ANGIOGRAM;  Surgeon: [name], MD;  Location: [facility] CATH LAB;  Service: Cardiovascular;  Laterality: N/A;   LOW EXTREMITY DOPPLERS/ABI  10/2016   Patent right SFA, popliteal and peroneal tendons. Patent left SFA and popliteal stent. 30-50% stenosis in the right common femoral and popliteal arteries, 50-74% stenosis in left profunda femoris artery. --> 1 year follow-up.  RABI - 1.2, LABI 1.1   LOWER EXTREMITY ANGIOGRAM N/A 02/22/2013   Procedure: LOWER EXTREMITY ANGIOGRAM;  Surgeon: Lorretta Harp, MD;  Location[facility] LAB;  Service: Cardiovascular;  Laterality: N/A;   LOWER EXTREMITY ANGIOGRAM N/A 07/20/2013   Procedure: LOWER EXTREMITY ANGIOGRAM;  Surgeon: Lorretta Harp, MD;  Location: [facility] CATH LAB;  Service: Cardiovascular;  Laterality: N/A;   NM MYOVIEW LTD  04/03/2013; 5/26'/2016   Lexiscan: a)  Apical perfusion defect with no ischemia. Consider prior infarct versus breast attenuation.;; b) 5/'16: LOW RISK, Nl EF ~55-65%, No Ischemia /Infarction   NM MYOVIEW LTD  10/'19;  4/'21   a) LOW RISK.  EF 55%.  No ischemia or infarction. b) EF estimated 81%.  Suboptimal images.  May be subtle inferior ischemia-initially read by radiology as intermediate risk, however overread by cardiology to be mild low risk.Marland Kitchen   PERCUTANEOUS STENT INTERVENTION Right 05/15/2013   Procedure: PERCUTANEOUS STENT INTERVENTION;  Surgeon: Lorretta Harp, MD;  Location: [facility] CATH LAB;  Service: Cardiovascular;  Laterality: Right;  tibioperoneal trunk   Peroneal Artery Stent  05/15/13   Diamondback rot. atherectomy of high grade segmental popliteal stenosis then Chocolate balloonPTA; then angiosculpt PTA of the prox peroneal with stent-Xpedition    pv angio  07/20/13   high-grade calcified popliteal disease with one-vessel runoff via a small anterior tibial artery that has proximal disease as well, no intervention   TOTAL HIP ARTHROPLASTY Right 10/12/2018   Procedure: RIGHT TOTAL HIP ARTHROPLASTY ANTERIOR APPROACH;  Surgeon: Mcarthur Rossetti, MD;  Location: [facility] ORS;  Service: Orthopedics;  Laterality: Right;   TOTAL HIP ARTHROPLASTY Left 11/21/2020   Procedure: LEFT TOTAL HIP ARTHROPLASTY ANTERIOR APPROACH;  Surgeon: Mcarthur Rossetti, MD;  Location: [facility] ORS;  Service: Orthopedics;  Laterality: Left;   TRANSTHORACIC ECHOCARDIOGRAM  11/'17; 10/19   a) EF 55-60%. Gr 1 DD. Normal PAP. Aortic Sclerosis.;; b) Normal LV size and function with vigorous EF 65 to 70%.  GR 1 DD.  Severe LA dilation (suggestive of probably worsening: GR 1 DD).   TRANSTHORACIC ECHOCARDIOGRAM  03/18/2020   EF 70-75%.  Hyperdynamic.  GR 1 DD.  No R WMA.  Mild LA dilation.  Mild aortic valve sclerosis but no stenosis.-No change from prior echo   TUBAL LIGATION       OB History   No obstetric history on file.     Family History  Adopted: Yes  Problem Relation Age of Onset   Hypertension Mother    Hyperlipidemia Mother    Hyperlipidemia Father    Hypertension Father     Social History   Tobacco Use   Smoking  status: Never   Smokeless tobacco: Never  Vaping Use   Vaping Use: Never used  Substance Use Topics   Alcohol use: Not Currently    Comment: rare glass of wine    Drug use: No    Home Medications Prior to Admission medications   Medication Sig Start Date End Date Taking? Authorizing Provider  acetaminophen (TYLENOL) 650 MG CR tablet Take 650 mg by mouth every 8 (eight) hours as needed for pain.    [provider]  acetaminophen-codeine (TYLENOL #3) 300-30 MG tablet Take 1-2 tablets by mouth every 8 (eight) hours as needed. 03/20/21  Mcarthur Rossetti, MD  amLODipine (NORVASC) 5 MG tablet Take 1 tablet (5 mg total) by mouth daily. 08/14/21 11/12/21  Lorretta Harp, MD  carvedilol (COREG) 6.25 MG tablet TAKE 1 TABLET BY MOUTH TWICE A DAY 04/13/21   Lorretta Harp, MD  Cholecalciferol (VITAMIN D3) 10 MCG (400 UNIT) tablet Take 400 Units by mouth daily.    [provider]  clopidogrel (PLAVIX) 75 MG tablet Take 75 mg by mouth daily.    [provider]  Coenzyme Q10 (CO Q 10 PO) Take 1 tablet by mouth every other day.     [provider]  ezetimibe (ZETIA) 10 MG tablet Take 10 mg by mouth at bedtime.    [provider]  folic acid (FOLVITE) 1 MG tablet Take 1 mg by mouth daily. 08/21/18   [provider]  furosemide (LASIX) 40 MG tablet Take 40 Mg (1 tablet) daily 03/04/20   Lorretta Harp, MD  gabapentin (NEURONTIN) 400 MG capsule Take 400 mg by mouth 3 (three) times daily. 06/09/20   [provider]  glimepiride (AMARYL) 1 MG tablet Take 2 mg by mouth daily with breakfast. 05/02/18   [provider]  isosorbide mononitrate (IMDUR) 60 MG 24 hr tablet TAKE 1.5 TABLETS (90 MG TOTAL) BY MOUTH DAILY. Marland Kitchen Patient taking differently: Take 60 mg by mouth daily. . 01/19/21   Lorretta Harp, MD  methocarbamol (ROBAXIN) 500 MG tablet Take 1 tablet (500 mg total) by mouth every 8 (eight) hours as needed for muscle spasms. 01/07/21    Pete Pelt, PA-C  methotrexate (RHEUMATREX) 2.5 MG tablet Take 20 mg by mouth every Friday. (8 tablets) 08/21/18   [provider]  MULTIPLE VITAMIN-FOLIC ACID PO     [provider]  nitroGLYCERIN (NITROSTAT) 0.4 MG SL tablet PLACE 1 TABLET (0.4 MG TOTAL) UNDER THE TONGUE EVERY 5 (FIVE) MINUTES AS NEEDED FOR CHEST PAIN. 02/13/18   Theora Gianotti, NP  pantoprazole (PROTONIX) 40 MG tablet Take 1 tablet (40 mg total) by mouth 2 (two) times daily before a meal. 03/19/20   Lavina Hamman, MD  predniSONE (DELTASONE) 5 MG tablet Take 5 mg by mouth daily with breakfast.     [provider]  rosuvastatin (CRESTOR) 40 MG tablet TAKE 1 TABLET BY MOUTH EVERYDAY AT BEDTIME 01/16/21   Lorretta Harp, MD  sulfaSALAzine (AZULFIDINE) 500 MG EC tablet Take 500 mg by mouth 2 (two) times daily.    [provider]    Allergies    Fish allergy, Ivp dye [iodinated diagnostic agents], Latex, Shellfish allergy, Evolocumab, Other, and Metformin  Review of Systems   Review of Systems  Constitutional:        Per HPI, otherwise negative  HENT:         Per HPI, otherwise negative  Respiratory:         Per HPI, otherwise negative  Cardiovascular:        Per HPI, otherwise negative  Gastrointestinal:  Negative for vomiting.  Endocrine:       Negative aside from HPI  Genitourinary:        Neg aside from HPI   Musculoskeletal:        Per HPI, otherwise negative  Skin: Negative.   Neurological:  Positive for syncope.   Physical Exam Updated Vital Signs BP 134/66   Pulse (!) 55   Temp 97.8 F (36.6 C) (Oral)   Resp 14   Ht 5\' 3"  (  1.6 m)   Wt 67.6 kg   SpO2 97%   BMI 26.39 kg/m   Physical Exam Vitals and nursing note reviewed.  Constitutional:      General: She is not in acute distress.    Appearance: She is well-developed.  HENT:     Head: Normocephalic and atraumatic.  Eyes:     Conjunctiva/sclera: Conjunctivae normal.  Cardiovascular:     Rate  and Rhythm: Normal rate and regular rhythm.  Pulmonary:     Effort: Pulmonary effort is normal. No respiratory distress.     Breath sounds: Normal breath sounds. No stridor.  Abdominal:     General: There is no distension.  Skin:    General: Skin is warm and dry.  Neurological:     Mental Status: She is alert and oriented to person, place, and time.     Cranial Nerves: No cranial nerve deficit.    ED Results / Procedures / Treatments   Labs (all labs ordered are listed, but only abnormal results are displayed) Labs Reviewed  COMPREHENSIVE METABOLIC PANEL - Abnormal; Notable for the following components:      Result Value   Sodium 132 (*)    Chloride 96 (*)    Glucose, Bld 205 (*)    Creatinine, Ser 1.15 (*)    Calcium 8.4 (*)    Total Protein 6.3 (*)    Albumin 3.3 (*)    AST 45 (*)    Alkaline Phosphatase 35 (*)    GFR, Estimated 47 (*)    All other components within normal limits  CBC WITH DIFFERENTIAL/PLATELET - Abnormal; Notable for the following components:   Hemoglobin 11.5 (*)    All other components within normal limits  CBG MONITORING, ED - Abnormal; Notable for the following components:   Glucose-Capillary 177 (*)    All other components within normal limits    EKG EKG Interpretation  Date/Time:  Wednesday September 23 2021 17:04:06 EDT Ventricular Rate:  56 PR Interval:  155 QRS Duration: 86 QT Interval:  457 QTC Calculation: 442 R Axis:   68 Text Interpretation: Sinus rhythm Nonspecific T abnrm, anterolateral leads Abnormal ECG Confirmed by Carmin Muskrat [phone]) on 09/23/2021 6:44:13 PM  Radiology DG Chest Port 1 View  Result Date: 09/23/2021 CLINICAL DATA:  87 year old female with history of syncope. EXAM: PORTABLE CHEST 1 VIEW COMPARISON:  Chest x-ray 11/17/2020. FINDINGS: Lung volumes are low. No consolidative airspace disease. No pleural effusions. No pneumothorax. No pulmonary nodule or mass noted. Pulmonary vasculature is normal. Heart size is  mildly enlarged. Upper mediastinal contours are within normal limits. Atherosclerotic calcifications in the thoracic aorta. Status post median sternotomy for CABG including LIMA. IMPRESSION: 1. Low lung volumes without radiographic evidence of acute cardiopulmonary disease. 2. Mild cardiomegaly. 3. Aortic atherosclerosis. Electronically Signed   By: Vinnie Langton M.D.   On: 09/23/2021 17:53    Procedures Procedures   Medications Ordered in ED Medications  0.9 %  sodium chloride infusion ( Intravenous New Bag/Given 09/23/21 1749)    ED Course  I have reviewed the triage vital signs and the nursing notes.  Pertinent labs & imaging results that were available during my care of the patient were reviewed by me and considered in my medical decision making (see chart for details).  Cardiac 70 sinus normal Pulse ox 100% room air normal  EMS rhythm strip reviewed, sinus rhythm, rate 68, artifact, otherwise unremarkable  Update: Patient receiving fluid resuscitation.  Blood pressure has improved substantially, heart  rate has diminished.  She is in no distress, has no additional complaints.  Update: Patient has received fluid resuscitation, no new complaints. MDM Rules/Calculators/A&P Adult female presents after episode of syncope.  Here patient is awake, alert, in no distress.  She acknowledges not eating, and labs are initially notable for findings consistent with mild dehydration.  Patient's blood pressure improved heart rate improved with fluid resuscitation, she had no additional complaints, no EKG suggesting sustained arrhythmia, no findings suggesting infection had been improvement here, hours of monitoring and no decompensation, patient discharged in stable condition.  MDM Number of Diagnoses or Management Options Dehydration: new, needed workup LOC (loss of consciousness) ([place]): new, needed workup   Amount and/or Complexity of Data Reviewed Clinical lab tests: ordered and  reviewed Tests in the radiology section of CPT: ordered and reviewed Tests in the medicine section of CPT: reviewed and ordered Decide to obtain previous medical records or to obtain history from someone other than the patient: yes Obtain history from someone other than the patient: yes Review and summarize past medical records: yes Independent visualization of images, tracings, or specimens: yes  Risk of Complications, Morbidity, and/or Mortality Presenting problems: high Diagnostic procedures: high Management options: high  Critical Care Total time providing critical care: < 30 minutes  Patient Progress Patient progress: stable   Final Clinical Impression(s) / ED Diagnoses Final diagnoses:  Dehydration  LOC (loss of consciousness) ([place])     Carmin Muskrat, MD 09/23/21 2142

## 2021-09-23 NOTE — Discharge Instructions (Addendum)
Today's evaluation has been generally reassuring aside from evidence for dehydration.  Importantly follow-up with your physician and to discuss today's event, and arrange appropriate ongoing outpatient management.  Return here for concerning changes in your condition.

## 2021-09-23 NOTE — ED Triage Notes (Signed)
Pt BIB EMS c/o witnessed syncope for 2 mins while on the couch. Pt states "I called my daughter because I was feeling bad because I haven't eaten anything and I called my daughter and she said I passed out". AO x 4 immediately when pt woke up. Denies dizziness/ nausea/chest  pain/ SOB before and after the syncopal episode.  EMS vital signs PTA: 96/62 68/48 Standing HR 70 CBG 239 BP 150/79 post NS 500cc bolus

## 2021-09-28 ENCOUNTER — Encounter: Payer: Self-pay | Admitting: Rehabilitative and Restorative Service Providers"

## 2021-09-28 ENCOUNTER — Ambulatory Visit (INDEPENDENT_AMBULATORY_CARE_PROVIDER_SITE_OTHER): Payer: Medicare Other | Admitting: Rehabilitative and Restorative Service Providers"

## 2021-09-28 DIAGNOSIS — M6281 Muscle weakness (generalized): Secondary | ICD-10-CM | POA: Diagnosis not present

## 2021-09-28 DIAGNOSIS — M79605 Pain in left leg: Secondary | ICD-10-CM

## 2021-09-28 DIAGNOSIS — R262 Difficulty in walking, not elsewhere classified: Secondary | ICD-10-CM | POA: Diagnosis not present

## 2021-09-28 NOTE — Therapy (Signed)
[facility] [address] [place], [place], [address] Phone: [phone]   Fax:  [phone]  Physical Therapy Treatment  Patient Details  Name: [name] MRN: [id number] Date of Birth: 1932-12-21 Referring Provider (PT): Kathryne Hitch, MD   Encounter Date: 09/28/2021   PT End of Session - 09/28/21 1329     Visit Number 2    Number of Visits 20    Date for PT Re-Evaluation 11/16/21    Authorization Type [employer] Medicare 20% co    Progress Note Due on Visit 10    PT Start Time 1321    PT Stop Time 1400    PT Time Calculation (min) 39 min    Activity Tolerance Patient tolerated treatment well    Behavior During Therapy [facility] for tasks assessed/performed             Past Medical History:  Diagnosis Date   Allergy to IVP dye    Angina, class I (HCC) 04/22/2015   Atherosclerotic heart disease native coronary artery w/angina pectoris (HCC) 1992   a. 1992 s/p PTCA of LAD;  b. 1998 CABG x 3; c. 07/2001 Cath: Sev LM/LAD/LCX dzs, 3/3 patent grafts; d. 11/2013 Cath: stable graft anatomy; e. 10/2016 Cath: native 3VD, VG->OM nl, LIMA->LAD nl, VG->Diag 55.   Atherosclerotic heart disease of native coronary artery with angina pectoris (HCC) 01/14/2010   Qualifier: Diagnosis of  By: Arlyce Dice MD, Barbette Hair    Atrial septal aneurysm    Barrett's esophagus 03/04/2010   Qualifier: Diagnosis of  By: Myrtie Hawk, Amy S    Bilateral leg edema 04/22/2015   Bradycardia, mild briefly to the 40s, asymptomatic 05/16/2013   Carotid arterial disease (HCC)    a. 05/2014 Carotid U/S: No signif bilat ICA stenosis, >60% L ECA.   Chronic anemia    Chronic diastolic CHF (congestive heart failure) (HCC)    a. 10/2016 Echo: Ef 55-60%, no rwma, Gr1 DD, triv AI, PASP , atrial septal aneurysm.   Chronic diastolic CHF (congestive heart failure), NYHA class 2 (HCC) 08/11/2017   Claudication (HCC) 05/16/2013   Peripheral arterial occlusive disease with  lifestyle limiting claudication    Degenerative joint disease 05/30/2018   DIVERTICULOSIS OF COLON 01/14/2010   Qualifier: Diagnosis of  By: Arlyce Dice MD, Barbette Hair    DM (diabetes mellitus), type 2 with peripheral vascular complications (HCC)    On Oral medications.   DOE (dyspnea on exertion) 04/22/2015   Essential hypertension    FECAL INCONTINENCE 03/04/2010   Qualifier: Diagnosis of  By: Monica Becton PA-c, Amy S    GERD (gastroesophageal reflux disease)    Hyperlipidemia with target LDL less than 70    Inflammatory arthritis 10/13/2017   Neuropathy    Non-insulin dependent type 2 diabetes mellitus (HCC) 10/24/2015   NSTEMI (non-ST elevated myocardial infarction) (HCC) 11/01/2016   PAD (peripheral artery disease) (HCC) 05/2013; 09/2013   a. severe calcified POP-1 & TP Trunk Dz bilat;  b . 05/2013 Diamondback Rot Athrectomy (R POP) --> PTA R Pop, DES to R Peroneal;  c. 09/2013 PTA/Stenting of L Pop Clemencia Course - retrograde access);  d. 04/2014 ABI's: R/L: 1.1/1.1.   Preoperative cardiovascular examination 04/15/2020   PUD (peptic ulcer disease)    Rheumatoid arthritis (HCC)    S/P CABG x 3 1998   LIMA-LAD, SVG-OM, SVG-D1   Severe claudication (HCC) 05/2013   Referred for Peripheral Angio (Dr. Allyson Sabal --> Dr. Hoy Finlay in [place])   [facility] ALLERGY 01/14/2010  Qualifier: Diagnosis of  By: Nelson-Smith CMA (AAMA), [name]     Status post total replacement of right hip 10/12/2018   Vertigo 01/02/2014    Past Surgical History:  Procedure Laterality Date   ATHERECTOMY Right 05/15/2013   Procedure: ATHERECTOMY;  Surgeon: Lorretta Harp, MD;  Location: [facility] CATH LAB;  Service: Cardiovascular;  Laterality: Right;  popliteal   BACK SURGERY     CARDIAC CATHETERIZATION  07/17/01   2 v CAD with LM, circ, LAD, obtuse diag, mod stenosis of diag vein graft , mild stenosis of marg vein graft, nl EF, medical treatment   CARDIAC CATHETERIZATION  11/2013   Widely patent LIMA-LAD & SVG-OM with mild progression  of proximal stenosis ~40-50% in SVG-D1; Widely patent native RCA with known severe native LCA disease   CARDIAC CATHETERIZATION N/A 11/01/2016   Procedure: Left Heart Cath and Cors/Grafts Angiography;  Surgeon: [name], MD;  Location: [place] CV LAB;  Service: Cardiovascular: Hemodynamics: High LVEDP!.  Cors/Grafts: LM 55%, o-p LAD 100% then after D1 -> LIMA-LAD patent, SVG-Diag ~55%. small RI wiht ~70% ostial. O-p Cx 80% - pCx 70% --> SVG-bifurcating OM patent. -- med Rx   CORONARY ANGIOPLASTY  1992   PCI to LAD   CORONARY ARTERY BYPASS GRAFT  1998   LIMA-LAD, SVG-diagonal (none roughly 50% stenosis), SVG-OM   LEFT HEART CATHETERIZATION WITH CORONARY ANGIOGRAM N/A 11/27/2013   Procedure: LEFT HEART CATHETERIZATION WITH CORONARY ANGIOGRAM;  Surgeon: [name], MD;  Location: [facility] [place] CATH LAB;  Service: Cardiovascular;  Laterality: N/A;   LOW EXTREMITY DOPPLERS/ABI  10/2016   Patent right SFA, popliteal and peroneal tendons. Patent left SFA and popliteal stent. 30-50% stenosis in the right common femoral and popliteal arteries, 50-74% stenosis in left profunda femoris artery. --> 1 year follow-up.  RABI - 1.2, LABI 1.1   LOWER EXTREMITY ANGIOGRAM N/A 02/22/2013   Procedure: LOWER EXTREMITY ANGIOGRAM;  Surgeon: Lorretta Harp, MD;  Location: [facility] CATH LAB;  Service: Cardiovascular;  Laterality: N/A;   LOWER EXTREMITY ANGIOGRAM N/A 07/20/2013   Procedure: LOWER EXTREMITY ANGIOGRAM;  Surgeon: Lorretta Harp, MD;  Location: [facility] CATH LAB;  Service: Cardiovascular;  Laterality: N/A;   NM MYOVIEW LTD  04/03/2013; 5/26'/2016   Lexiscan: a)  Apical perfusion defect with no ischemia. Consider prior infarct versus breast attenuation.;; b) 5/'16: LOW RISK, Nl EF ~55-65%, No Ischemia /Infarction   NM MYOVIEW LTD  10/'19; 4/'21   a) LOW RISK.  EF 55%.  No ischemia or infarction. b) EF estimated 81%.  Suboptimal images.  May be subtle inferior ischemia-initially read by radiology as intermediate risk,  however overread by cardiology to be mild low risk.Marland Kitchen   PERCUTANEOUS STENT INTERVENTION Right 05/15/2013   Procedure: PERCUTANEOUS STENT INTERVENTION;  Surgeon: Lorretta Harp, MD;  Location: [facility] CATH LAB;  Service: Cardiovascular;  Laterality: Right;  tibioperoneal trunk   Peroneal Artery Stent  05/15/13   Diamondback rot. atherectomy of high grade segmental popliteal stenosis then Chocolate balloonPTA; then angiosculpt PTA of the prox peroneal with stent-Xpedition    pv angio  07/20/13   high-grade calcified popliteal disease with one-vessel runoff via a small anterior tibial artery that has proximal disease as well, no intervention   TOTAL HIP ARTHROPLASTY Right 10/12/2018   Procedure: RIGHT TOTAL HIP ARTHROPLASTY ANTERIOR APPROACH;  Surgeon: Mcarthur Rossetti, MD;  Location: [facility] ORS;  Service: Orthopedics;  Laterality: Right;   TOTAL HIP ARTHROPLASTY Left 11/21/2020   Procedure: LEFT TOTAL HIP ARTHROPLASTY ANTERIOR  APPROACH;  Surgeon: Mcarthur Rossetti, MD;  Location: [facility] ORS;  Service: Orthopedics;  Laterality: Left;   TRANSTHORACIC ECHOCARDIOGRAM  11/'17; 10/19   a) EF 55-60%. Gr 1 DD. Normal PAP. Aortic Sclerosis.;; b) Normal LV size and function with vigorous EF 65 to 70%.  GR 1 DD.  Severe LA dilation (suggestive of probably worsening: GR 1 DD).   TRANSTHORACIC ECHOCARDIOGRAM  03/18/2020   EF 70-75%.  Hyperdynamic.  GR 1 DD.  No R WMA.  Mild LA dilation.  Mild aortic valve sclerosis but no stenosis.-No change from prior echo   TUBAL LIGATION      There were no vitals filed for this visit.   Subjective Assessment - 09/28/21 1326     Subjective Pt. indicated no pain upon arrival with walking.  Felt leg was numb feeling and still tender to touch .  Pt. indicated she went to ER because of passing out last week.  Pt. indicated feeling ok overall today.    Pertinent History PMH: CAD s/p CABG, CHF, PAD with claudication, DM with neuropathy, HTN, HLD, NSTEMI, RA, bil THA, hx back surgery     Limitations Walking;Standing    How long can you walk comfortably? to mailbox    Patient Stated Goals Reduce pain, walk better    Currently in Pain? No/denies    Pain Score 0-No pain    Pain Location Hip    Pain Orientation Left    Pain Descriptors / Indicators Tender    Pain Type Chronic pain    Pain Onset More than a month ago    Pain Frequency Intermittent    Aggravating Factors  tender to touch    Pain Relieving Factors don't touch                [facility] PT Assessment - 09/28/21 0001       Assessment   Medical Diagnosis Z96.642 (ICD-10-CM) - History of left hip replacement    Referring Provider (PT) Mcarthur Rossetti, MD    Onset Date/Surgical Date 11/21/20    Hand Dominance Right                           [facility] Adult PT Treatment/Exercise - 09/28/21 0001       Exercises   Other Exercises  Additional time spent on cue review      Knee/Hip Exercises: Stretches   Other Knee/Hip Stretches single knee to opposite shoulder 15 seconds x 5 Lt      Knee/Hip Exercises: Aerobic   Nustep UE/LE lvl 5 6 mins      Knee/Hip Exercises: Seated   Long Arc Quad Both;2 sets;10 reps    Sit to [employer] without UE support;10 reps      Knee/Hip Exercises: Supine   Bridges 2 sets;10 reps   2-3 second hold     Knee/Hip Exercises: Sidelying   Clams 2 x 10 Lt hip                     PT Education - 09/28/21 1340     Education Details HEP    Person(s) Educated Patient    Methods Explanation;Demonstration;Verbal cues;Handout    Comprehension Verbal cues required;Returned demonstration;Verbalized understanding              PT Short Term Goals - 09/28/21 1340       PT SHORT TERM GOAL #1   Title Patient will demonstrate independent use of  home exercise program to maintain progress from in clinic treatments.    Time 3    Period Weeks    Status Not Met    Target Date 09/28/21               PT Long Term Goals - 09/07/21 1532        PT LONG TERM GOAL #1   Title Patient will demonstrate/report pain at worst less than or equal to 2/10 to facilitate minimal limitation in daily activity secondary to pain symptoms.    Time 10    Period Weeks    Status New    Target Date 11/16/21      PT LONG TERM GOAL #2   Title Patient will demonstrate independent use of home exercise program to facilitate ability to maintain/progress functional gains from skilled physical therapy services.    Time 10    Period Weeks    Status New    Target Date 11/16/21      PT LONG TERM GOAL #3   Title Pt. will demonstrate FOTO outcome > or = 60% to indicated reduced disability due to condition.    Time 10    Period Weeks    Status New    Target Date 11/16/21      PT LONG TERM GOAL #4   Title Pt. will demonstrate 5x sit to stand < 20 seconds to facilitate reduction in fall risk and improved functional mobility capability.    Time 10    Period Weeks    Status New    Target Date 11/16/21      PT LONG TERM GOAL #5   Title Pt. will demonstrate ability to walk independently > 500 ft to facilitate community ambulation, mailbox ambulation s fatigue.    Time 10    Period Weeks    Status New    Target Date 11/16/21      Additional Long Term Goals   Additional Long Term Goals Yes      PT LONG TERM GOAL #6   Title Pt. will demonstrate bilateral hip MMT > = 4/5, knee 5/5 bilateral to facilitate ability to perform walking, transfers at [facility].    Time 10    Period Weeks    Status New    Target Date 11/16/21                   Plan - 09/28/21 1340     Clinical Impression Statement Pt. missed several appointments between evaluation and today's visit.  Minimal progress noted overall. Pt. to benefit from continued skilled PT services to address strength/mobility deficits.    Personal Factors and Comorbidities Comorbidity 3+    Comorbidities PMH: CAD s/p CABG, CHF, PAD with claudication, DM with neuropathy, HTN, HLD, NSTEMI, RA, bil THA, hx  back surgery    Examination-Activity Limitations Sleep;Squat;Bend;Stairs;Stand;Carry;Transfers;Lift;Locomotion Level    Examination-Participation Restrictions Cleaning;Community Activity;Interpersonal Relationship;Meal Prep    Stability/Clinical Decision Making Evolving/Moderate complexity    Rehab Potential Fair   fair to good   PT Frequency 2x / week    PT Duration Other (comment)   10 weeks   PT Treatment/Interventions ADLs/Self Care Home Management;Cryotherapy;Electrical Stimulation;Iontophoresis 4mg /ml Dexamethasone;Moist Heat;Balance training;Therapeutic exercise;Therapeutic activities;Functional mobility training;Stair training;Gait training;DME Instruction;Neuromuscular re-education;Patient/family education;Passive range of motion;Spinal Manipulations;Joint Manipulations;Dry needling;Taping;Manual techniques    PT Next Visit Plan Progressive strengthening and endurance for lower extremity. Possible inclusion of myofascial release techniques    PT Home Exercise Plan [email or web address]    Consulted and Agree  with Plan of Care Patient             Patient will benefit from skilled therapeutic intervention in order to improve the following deficits and impairments:  Abnormal gait, Hypomobility, Pain, Decreased strength, Decreased activity tolerance, Decreased mobility, Difficulty walking, Decreased balance, Decreased range of motion, Decreased coordination, Postural dysfunction, Impaired perceived functional ability, Improper body mechanics, Decreased endurance  Visit Diagnosis: Pain in left leg  Muscle weakness (generalized)  Difficulty in walking, not elsewhere classified     Problem List Patient Active Problem List   Diagnosis Date Noted   Chronic diastolic heart failure ([place]) 01/07/2021   Status post total hip replacement, right 12/04/2020   Status post total replacement of left hip 11/21/2020   Near syncope 07/10/2020   Hx of CABG 07/10/2020   Preoperative cardiovascular  examination 04/15/2020   Chest pain 03/18/2020   Unilateral primary osteoarthritis, left hip 04/03/2019   History of right hip replacement 04/03/2019   Status post total replacement of right hip 10/12/2018   Unilateral primary osteoarthritis, right hip 09/12/2018   Degenerative joint disease 05/30/2018   Anal fissure 11/10/2017   Rectal bleeding 11/10/2017   Rectal pain 11/10/2017   Inflammatory arthritis 10/13/2017   Latent tuberculosis by blood test 10/13/2017   Chronic diastolic CHF (congestive heart failure), NYHA class 2 ([place]) 08/11/2017   Fatigue due to treatment 01/09/2017   NSTEMI (non-ST elevated myocardial infarction) ([place]) 11/01/2016   Diabetes mellitus ([place]) 10/24/2015   Non-insulin dependent type 2 diabetes mellitus ([place]) 10/24/2015   Angina, class I ([place]) 04/22/2015   DOE (dyspnea on exertion) 04/22/2015   Bilateral leg edema 04/22/2015   Accumulation of fluid in tissues 03/21/2014   Vertigo, central 01/02/2014   Vertigo 01/02/2014   Limb pain- echymosis, pain Lt radial artery after cath 12/11/2013   Dyslipidemia, goal LDL below 70 05/17/2013   Essential hypertension 05/17/2013   Claudication ([place]) 05/16/2013   PTA/ Stent Rt popliteal and Rt peroneal artery 05/16/13 05/16/2013   PAD,severe calcified pop and tibial peroneal trunk disease bilateraly    ABDOMINAL PAIN RIGHT LOWER QUADRANT 10/07/2010   CVA-STROKE 03/04/2010   BARRETT'S ESOPHAGUS 03/04/2010   FECAL INCONTINENCE 03/04/2010   DM 01/14/2010   Anemia of chronic disease 01/14/2010   Atherosclerotic heart disease of native coronary artery with angina pectoris ([place]) 01/14/2010   DIVERTICULOSIS OF COLON 01/14/2010   PERSONAL HISTORY OF COLONIC POLYPS 01/14/2010   SHELLFISH ALLERGY 01/14/2010   Scot Jun, PT, DPT, OCS, ATC 09/28/21  1:59 PM    [facility] [address] [place], [name], [address] Phone: [phone]   Fax:  [phone]  Name: [name] MRN:  [id number] Date of Birth: [birth year]

## 2021-09-28 NOTE — Patient Instructions (Signed)
Access Code: [email or web address] URL: [email or web address] Date: 09/28/2021 Prepared by: Scot Jun  Exercises Clamshell - 2 x daily - 7 x weekly - 3 sets - 10 reps Supine Bridge - 2 x daily - 7 x weekly - 3 sets - 10 reps - 2 hold Supine Piriformis Stretch with Foot on Ground - 2 x daily - 7 x weekly - 1 sets - 5 reps - 30 hold Sit to Stand - 1 x daily - 7 x weekly - 3 sets - 10 reps

## 2021-09-29 ENCOUNTER — Ambulatory Visit: Payer: Medicare Other

## 2021-09-30 ENCOUNTER — Ambulatory Visit (INDEPENDENT_AMBULATORY_CARE_PROVIDER_SITE_OTHER): Payer: Medicare Other | Admitting: Rehabilitative and Restorative Service Providers"

## 2021-09-30 ENCOUNTER — Encounter: Payer: Self-pay | Admitting: Rehabilitative and Restorative Service Providers"

## 2021-09-30 ENCOUNTER — Other Ambulatory Visit: Payer: Self-pay

## 2021-09-30 DIAGNOSIS — M6281 Muscle weakness (generalized): Secondary | ICD-10-CM | POA: Diagnosis not present

## 2021-09-30 DIAGNOSIS — R262 Difficulty in walking, not elsewhere classified: Secondary | ICD-10-CM

## 2021-09-30 DIAGNOSIS — M79605 Pain in left leg: Secondary | ICD-10-CM

## 2021-09-30 NOTE — Therapy (Signed)
[facility] [address] [place], [name], [address] Phone: [phone]   Fax:  [phone]  Physical Therapy Treatment  Patient Details  Name: [name] MRN: [id number] Date of Birth: October 27, 1933 Referring Provider (PT): Mcarthur Rossetti, MD   Encounter Date: 09/30/2021   PT End of Session - 09/30/21 1308     Visit Number 3    Number of Visits 20    Date for PT Re-Evaluation 11/16/21    Authorization Type U[employer] Medicare 20% co    Progress Note Due on Visit 10    PT Start Time 1304    PT Stop Time 1343    PT Time Calculation (min) 39 min    Activity Tolerance Patient tolerated treatment well    Behavior During Therapy [facility] for tasks assessed/performed             Past Medical History:  Diagnosis Date   Allergy to IVP dye    Angina, class I ([place]) 04/22/2015   Atherosclerotic heart disease native coronary artery w/angina pectoris ([place]) 1992   a. 1992 s/p PTCA of LAD;  b. 1998 CABG x 3; c. 07/2001 Cath: Sev LM/LAD/LCX dzs, 3/3 patent grafts; d. 11/2013 Cath: stable graft anatomy; e. 10/2016 Cath: native 3VD, VG->OM nl, LIMA->LAD nl, VG->Diag 55.   Atherosclerotic heart disease of native coronary artery with angina pectoris ([place]) 01/14/2010   Qualifier: Diagnosis of  By: Deatra Ina MD, Sandy Salaam    Atrial septal aneurysm    Barrett's esophagus 03/04/2010   Qualifier: Diagnosis of  By: Shane Crutch, Amy S    Bilateral leg edema 04/22/2015   Bradycardia, mild briefly to the 40s, asymptomatic 05/16/2013   Carotid arterial disease ([place])    a. 05/2014 Carotid U/S: No signif bilat ICA stenosis, >60% L ECA.   Chronic anemia    Chronic diastolic CHF (congestive heart failure) ([place])    a. 10/2016 Echo: Ef 55-60%, no rwma, Gr1 DD, triv AI, PASP 58mHg, atrial septal aneurysm.   Chronic diastolic CHF (congestive heart failure), NYHA class 2 ([place] 08/11/2017   Claudication ([place] 05/16/2013   Peripheral arterial occlusive disease with  lifestyle limiting claudication    Degenerative joint disease 05/30/2018   DIVERTICULOSIS OF COLON 01/14/2010   Qualifier: Diagnosis of  By: KDeatra InaMD, RSandy Salaam   DM (diabetes mellitus), type 2 with peripheral vascular complications ([place]    On Oral medications.   DOE (dyspnea on exertion) 04/22/2015   Essential hypertension    FECAL INCONTINENCE 03/04/2010   Qualifier: Diagnosis of  By: ETrellis PaganiniPA-c, Amy S    GERD (gastroesophageal reflux disease)    Hyperlipidemia with target LDL less than 70    Inflammatory arthritis 10/13/2017   Neuropathy    Non-insulin dependent type 2 diabetes mellitus ([place] 10/24/2015   NSTEMI (non-ST elevated myocardial infarction) ([place] 11/01/2016   PAD (peripheral artery disease) ([place] 05/2013; 09/2013   a. severe calcified POP-1 & TP Trunk Dz bilat;  b . 05/2013 Diamondback Rot Athrectomy (R POP) --> PTA R Pop, DES to R Peroneal;  c. 09/2013 PTA/Stenting of L Pop ([name]- retrograde access);  d. 04/2014 ABI's: R/L: 1.1/1.1.   Preoperative cardiovascular examination 04/15/2020   PUD (peptic ulcer disease)    Rheumatoid arthritis ([place]    S/P CABG x 3 1998   LIMA-LAD, SVG-OM, SVG-D1   Severe claudication ([place] 05/2013   Referred for Peripheral Angio (Dr. BGwenlyn Found--> Dr. GBrunetta Jeansin [place]   [place]/01/2010  Qualifier: Diagnosis of  By: Nelson-Smith CMA (AAMA), [name]     Status post total replacement of right hip 10/12/2018   Vertigo 01/02/2014    Past Surgical History:  Procedure Laterality Date   ATHERECTOMY Right 05/15/2013   Procedure: ATHERECTOMY;  Surgeon: Lorretta Harp, MD;  Location: [facility] CATH LAB;  Service: Cardiovascular;  Laterality: Right;  popliteal   BACK SURGERY     CARDIAC CATHETERIZATION  07/17/01   2 v CAD with LM, circ, LAD, obtuse diag, mod stenosis of diag vein graft , mild stenosis of marg vein graft, nl EF, medical treatment   CARDIAC CATHETERIZATION  11/2013   Widely patent LIMA-LAD & SVG-OM with mild progression  of proximal stenosis ~40-50% in SVG-D1; Widely patent native RCA with known severe native LCA disease   CARDIAC CATHETERIZATION N/A 11/01/2016   Procedure: Left Heart Cath and Cors/Grafts Angiography;  Surgeon: [name], MD;  Location: [place] CV LAB;  Service: Cardiovascular: Hemodynamics: High LVEDP!.  Cors/Grafts: LM 55%, o-p LAD 100% then after D1 -> LIMA-LAD patent, SVG-Diag ~55%. small RI wiht ~70% ostial. O-p Cx 80% - pCx 70% --> SVG-bifurcating OM patent. -- med Rx   CORONARY ANGIOPLASTY  1992   PCI to LAD   CORONARY ARTERY BYPASS GRAFT  1998   LIMA-LAD, SVG-diagonal (none roughly 50% stenosis), SVG-OM   LEFT HEART CATHETERIZATION WITH CORONARY ANGIOGRAM N/A 11/27/2013   Procedure: LEFT HEART CATHETERIZATION WITH CORONARY ANGIOGRAM;  Surgeon: [name], MD;  Location: [facility] C[facility] LAB;  Service: Cardiovascular;  Laterality: N/A;   LOW EXTREMITY DOPPLERS/ABI  10/2016   Patent right SFA, popliteal and peroneal tendons. Patent left SFA and popliteal stent. 30-50% stenosis in the right common femoral and popliteal arteries, 50-74% stenosis in left profunda femoris artery. --> 1 year follow-up.  RABI - 1.2, LABI 1.1   LOWER EXTREMITY ANGIOGRAM N/A 02/22/2013   Procedure: LOWER EXTREMITY ANGIOGRAM;  Surgeon: Lorretta Harp, MD;  Location: [facility] CATH LAB;  Service: Cardiovascular;  Laterality: N/A;   LOWER EXTREMITY ANGIOGRAM N/A 07/20/2013   Procedure: LOWER EXTREMITY ANGIOGRAM;  Surgeon: Lorretta Harp, MD;  Location[facility] CATH LAB;  Service: Cardiovascular;  Laterality: N/A;   NM MYOVIEW LTD  04/03/2013; 5/26'/2016   Lexiscan: a)  Apical perfusion defect with no ischemia. Consider prior infarct versus breast attenuation.;; b) 5/'16: LOW RISK, Nl EF ~55-65%, No Ischemia /Infarction   NM MYOVIEW LTD  10/'19; 4/'21   a) LOW RISK.  EF 55%.  No ischemia or infarction. b) EF estimated 81%.  Suboptimal images.  May be subtle inferior ischemia-initially read by radiology as intermediate risk,  however overread by cardiology to be mild low risk.Marland Kitchen   PERCUTANEOUS STENT INTERVENTION Right 05/15/2013   Procedure: PERCUTANEOUS STENT INTERVENTION;  Surgeon: Lorretta Harp, MD;  Location: [facility] CATH LAB;  Service: Cardiovascular;  Laterality: Right;  tibioperoneal trunk   Peroneal Artery Stent  05/15/13   Diamondback rot. atherectomy of high grade segmental popliteal stenosis then Chocolate balloonPTA; then angiosculpt PTA of the prox peroneal with stent-Xpedition    pv angio  07/20/13   high-grade calcified popliteal disease with one-vessel runoff via a small anterior tibial artery that has proximal disease as well, no intervention   TOTAL HIP ARTHROPLASTY Right 10/12/2018   Procedure: RIGHT TOTAL HIP ARTHROPLASTY ANTERIOR APPROACH;  Surgeon: Mcarthur Rossetti, MD;  Location: [facility] ORS;  Service: Orthopedics;  Laterality: Right;   TOTAL HIP ARTHROPLASTY Left 11/21/2020   Procedure: LEFT TOTAL HIP ARTHROPLASTY ANTERIOR  APPROACH;  Surgeon: Mcarthur Rossetti, MD;  Location: [facility] ORS;  Service: Orthopedics;  Laterality: Left;   TRANSTHORACIC ECHOCARDIOGRAM  11/'17; 10/19   a) EF 55-60%. Gr 1 DD. Normal PAP. Aortic Sclerosis.;; b) Normal LV size and function with vigorous EF 65 to 70%.  GR 1 DD.  Severe LA dilation (suggestive of probably worsening: GR 1 DD).   TRANSTHORACIC ECHOCARDIOGRAM  03/18/2020   EF 70-75%.  Hyperdynamic.  GR 1 DD.  No R WMA.  Mild LA dilation.  Mild aortic valve sclerosis but no stenosis.-No change from prior echo   TUBAL LIGATION      There were no vitals filed for this visit.   Subjective Assessment - 09/30/21 1307     Subjective Pt. indicated pain 4 or 5/10 today.  Had complaints in distal lateral thigh to knee at times c movement in clinic.    Pertinent History PMH: CAD s/p CABG, CHF, PAD with claudication, DM with neuropathy, HTN, HLD, NSTEMI, RA, bil THA, hx back surgery    Limitations Walking;Standing    How long can you walk comfortably? to mailbox    Patient  Stated Goals Reduce pain, walk better    Currently in Pain? Yes    Pain Score 4     Pain Orientation Left    Pain Descriptors / Indicators Tender;Sore    Pain Type Chronic pain    Pain Onset More than a month ago    Pain Frequency Intermittent    Aggravating Factors  nothing specific reported, just hurting upon arrival.    Pain Relieving Factors rest sometimes helps                               [facility] Adult PT Treatment/Exercise - 09/30/21 0001       Knee/Hip Exercises: Stretches   Other Knee/Hip Stretches single knee to opposite shoulder 15 seconds x 5 Lt      Knee/Hip Exercises: Seated   Long Arc Quad Both;2 sets;10 reps    Long Arc Quad Weight 2 lbs.    Sit to Sand without UE support;10 reps   18 inch chair     Knee/Hip Exercises: Supine   Other Supine Knee/Hip Exercises supine sciatic nerve flossing (knee extension c ankle Df, then reverse)  x10      Knee/Hip Exercises: Sidelying   Clams 3 x 10 Lt hip (cues required)      Manual Therapy   Manual therapy comments compression c movement to Lt glute med trigger points, Percussive device to Lt glute med/min, max and lateral thigh (poor tolerance overall due to tenderness)                       PT Short Term Goals - 09/28/21 1340       PT SHORT TERM GOAL #1   Title Patient will demonstrate independent use of home exercise program to maintain progress from in clinic treatments.    Time 3    Period Weeks    Status Not Met    Target Date 09/28/21               PT Long Term Goals - 09/30/21 1325       PT LONG TERM GOAL #1   Title Patient will demonstrate/report pain at worst less than or equal to 2/10 to facilitate minimal limitation in daily activity secondary to pain symptoms.    Time  10    Period Weeks    Status On-going    Target Date 11/16/21      PT LONG TERM GOAL #2   Title Patient will demonstrate independent use of home exercise program to facilitate ability to  maintain/progress functional gains from skilled physical therapy services.    Time 10    Period Weeks    Status On-going    Target Date 11/16/21      PT LONG TERM GOAL #3   Title Pt. will demonstrate FOTO outcome > or = 60% to indicated reduced disability due to condition.    Time 10    Period Weeks    Status On-going    Target Date 11/16/21      PT LONG TERM GOAL #4   Title Pt. will demonstrate 5x sit to stand < 20 seconds to facilitate reduction in fall risk and improved functional mobility capability.    Time 10    Period Weeks    Status On-going    Target Date 11/16/21      PT LONG TERM GOAL #5   Title Pt. will demonstrate ability to walk independently > 500 ft to facilitate community ambulation, mailbox ambulation s fatigue.    Time 10    Period Weeks    Status On-going    Target Date 11/16/21      PT LONG TERM GOAL #6   Title Pt. will demonstrate bilateral hip MMT > = 4/5, knee 5/5 bilateral to facilitate ability to perform walking, transfers at [facility].    Time 10    Period Weeks    Status On-going    Target Date 11/16/21                   Plan - 09/30/21 1323     Clinical Impression Statement Numerous and irritability trigger points throughout Lt lateral/posterior hip and lateral thigh c concordant symptoms noted in area.  Due to tenderness to touch, poor tolerance overall to manual myofascial release techniques today.  Cues required consistently through HEP review and ther ex interventions.    Personal Factors and Comorbidities Comorbidity 3+    Comorbidities PMH: CAD s/p CABG, CHF, PAD with claudication, DM with neuropathy, HTN, HLD, NSTEMI, RA, bil THA, hx back surgery    Examination-Activity Limitations Sleep;Squat;Bend;Stairs;Stand;Carry;Transfers;Lift;Locomotion Level    Examination-Participation Restrictions Cleaning;Community Activity;Interpersonal Relationship;Meal Prep    Stability/Clinical Decision Making Evolving/Moderate complexity    Rehab  Potential Fair   fair to good   PT Frequency 2x / week    PT Duration Other (comment)   10 weeks   PT Treatment/Interventions ADLs/Self Care Home Management;Cryotherapy;Electrical Stimulation;Iontophoresis 77m/ml Dexamethasone;Moist Heat;Balance training;Therapeutic exercise;Therapeutic activities;Functional mobility training;Stair training;Gait training;DME Instruction;Neuromuscular re-education;Patient/family education;Passive range of motion;Spinal Manipulations;Joint Manipulations;Dry needling;Taping;Manual techniques    PT Next Visit Plan Possible use of myofascial release techniques.  Continue to promote improved mobility and strengthening.    PT Home Exercise Plan [id number]    Consulted and Agree with Plan of Care Patient             Patient will benefit from skilled therapeutic intervention in order to improve the following deficits and impairments:  Abnormal gait, Hypomobility, Pain, Decreased strength, Decreased activity tolerance, Decreased mobility, Difficulty walking, Decreased balance, Decreased range of motion, Decreased coordination, Postural dysfunction, Impaired perceived functional ability, Improper body mechanics, Decreased endurance  Visit Diagnosis: Pain in left leg  Muscle weakness (generalized)  Difficulty in walking, not elsewhere classified     Problem List Patient  Active Problem List   Diagnosis Date Noted   Chronic diastolic heart failure ([place]) 01/07/2021   Status post total hip replacement, right 12/04/2020   Status post total replacement of left hip 11/21/2020   Near syncope 07/10/2020   Hx of CABG 07/10/2020   Preoperative cardiovascular examination 04/15/2020   Chest pain 03/18/2020   Unilateral primary osteoarthritis, left hip 04/03/2019   History of right hip replacement 04/03/2019   Status post total replacement of right hip 10/12/2018   Unilateral primary osteoarthritis, right hip 09/12/2018   Degenerative joint disease 05/30/2018   Anal  fissure 11/10/2017   Rectal bleeding 11/10/2017   Rectal pain 11/10/2017   Inflammatory arthritis 10/13/2017   Latent tuberculosis by blood test 10/13/2017   Chronic diastolic CHF (congestive heart failure), NYHA class 2 ([place]) 08/11/2017   Fatigue due to treatment 01/09/2017   NSTEMI (non-ST elevated myocardial infarction) ([place]) 11/01/2016   Diabetes mellitus ([place]) 10/24/2015   Non-insulin dependent type 2 diabetes mellitus ([place]) 10/24/2015   Angina, class I ([place]) 04/22/2015   DOE (dyspnea on exertion) 04/22/2015   Bilateral leg edema 04/22/2015   Accumulation of fluid in tissues 03/21/2014   Vertigo, central 01/02/2014   Vertigo 01/02/2014   Limb pain- echymosis, pain Lt radial artery after cath 12/11/2013   Dyslipidemia, goal LDL below 70 05/17/2013   Essential hypertension 05/17/2013   Claudication ([place]) 05/16/2013   PTA/ Stent Rt popliteal and Rt peroneal artery 05/16/13 05/16/2013   PAD,severe calcified pop and tibial peroneal trunk disease bilateraly    ABDOMINAL PAIN RIGHT LOWER QUADRANT 10/07/2010   CVA-STROKE 03/04/2010   BARRETT'S ESOPHAGUS 03/04/2010   FECAL INCONTINENCE 03/04/2010   DM 01/14/2010   Anemia of chronic disease 01/14/2010   Atherosclerotic heart disease of native coronary artery with angina pectoris ([place]) 01/14/2010   DIVERTICULOSIS OF COLON 01/14/2010   PERSONAL HISTORY OF COLONIC POLYPS 01/14/2010   SHELLFISH ALLERGY 01/14/2010    Scot Jun, PT, DPT, OCS, ATC 09/30/21  1:39 PM    [facility] [address] [place], [name], [address] Phone: [phone]   Fax:  [phone]  Name: [name] MRN: [id number] Date of Birth: 1933/07/03

## 2021-10-05 ENCOUNTER — Ambulatory Visit (INDEPENDENT_AMBULATORY_CARE_PROVIDER_SITE_OTHER): Payer: Medicare Other | Admitting: Rehabilitative and Restorative Service Providers"

## 2021-10-05 ENCOUNTER — Other Ambulatory Visit: Payer: Self-pay

## 2021-10-05 ENCOUNTER — Encounter: Payer: Self-pay | Admitting: Rehabilitative and Restorative Service Providers"

## 2021-10-05 DIAGNOSIS — R262 Difficulty in walking, not elsewhere classified: Secondary | ICD-10-CM | POA: Diagnosis not present

## 2021-10-05 DIAGNOSIS — M79605 Pain in left leg: Secondary | ICD-10-CM | POA: Diagnosis not present

## 2021-10-05 DIAGNOSIS — M6281 Muscle weakness (generalized): Secondary | ICD-10-CM | POA: Diagnosis not present

## 2021-10-05 NOTE — Therapy (Signed)
[facility] [address] [place], [name], [address] Phone: [phone]   Fax:  [phone]  Physical Therapy Treatment  Patient Details  Name: [name] MRN: [id number] Date of Birth: March 25, 1933 Referring Provider (PT): Mcarthur Rossetti, MD   Encounter Date: 10/05/2021   PT End of Session - 10/05/21 1314     Visit Number 4    Number of Visits 20    Date for PT Re-Evaluation 11/16/21    Authorization Type U[employer] Medicare 20% co    Progress Note Due on Visit 10    PT Start Time 1311    PT Stop Time 1342    PT Time Calculation (min) 31 min    Activity Tolerance Patient limited by pain;Patient limited by fatigue    Behavior During Therapy [facility] for tasks assessed/performed             Past Medical History:  Diagnosis Date   Allergy to IVP dye    Angina, class I ([place]) 04/22/2015   Atherosclerotic heart disease native coronary artery w/angina pectoris ([place]) 1992   a. 1992 s/p PTCA of LAD;  b. 1998 CABG x 3; c. 07/2001 Cath: Sev LM/LAD/LCX dzs, 3/3 patent grafts; d. 11/2013 Cath: stable graft anatomy; e. 10/2016 Cath: native 3VD, VG->OM nl, LIMA->LAD nl, VG->Diag 55.   Atherosclerotic heart disease of native coronary artery with angina pectoris ([place]) 01/14/2010   Qualifier: Diagnosis of  By: Deatra Ina MD, Sandy Salaam    Atrial septal aneurysm    Barrett's esophagus 03/04/2010   Qualifier: Diagnosis of  By: Shane Crutch, Amy S    Bilateral leg edema 04/22/2015   Bradycardia, mild briefly to the 40s, asymptomatic 05/16/2013   Carotid arterial disease ([place])    a. 05/2014 Carotid U/S: No signif bilat ICA stenosis, >60% L ECA.   Chronic anemia    Chronic diastolic CHF (congestive heart failure) ([place])    a. 10/2016 Echo: Ef 55-60%, no rwma, Gr1 DD, triv AI, PASP 16mHg, atrial septal aneurysm.   Chronic diastolic CHF (congestive heart failure), NYHA class 2 ([place] 08/11/2017   Claudication ([place] 05/16/2013   Peripheral arterial occlusive  disease with lifestyle limiting claudication    Degenerative joint disease 05/30/2018   DIVERTICULOSIS OF COLON 01/14/2010   Qualifier: Diagnosis of  By: KDeatra InaMD, RSandy Salaam   DM (diabetes mellitus), type 2 with peripheral vascular complications ([place]    On Oral medications.   DOE (dyspnea on exertion) 04/22/2015   Essential hypertension    FECAL INCONTINENCE 03/04/2010   Qualifier: Diagnosis of  By: ETrellis PaganiniPA-c, Amy S    GERD (gastroesophageal reflux disease)    Hyperlipidemia with target LDL less than 70    Inflammatory arthritis 10/13/2017   Neuropathy    Non-insulin dependent type 2 diabetes mellitus ([place] 10/24/2015   NSTEMI (non-ST elevated myocardial infarction) ([place] 11/01/2016   PAD (peripheral artery disease) ([place] 05/2013; 09/2013   a. severe calcified POP-1 & TP Trunk Dz bilat;  b . 05/2013 Diamondback Rot Athrectomy (R POP) --> PTA R Pop, DES to R Peroneal;  c. 09/2013 PTA/Stenting of L Pop ([name]- retrograde access);  d. 04/2014 ABI's: R/L: 1.1/1.1.   Preoperative cardiovascular examination 04/15/2020   PUD (peptic ulcer disease)    Rheumatoid arthritis ([place]    S/P CABG x 3 1998   LIMA-LAD, SVG-OM, SVG-D1   Severe claudication ([place] 05/2013   Referred for Peripheral Angio (Dr. BGwenlyn Found--> Dr. GBrunetta Jeansin [facility]  ALLERGY 01/14/2010   Qualifier: Diagnosis of  By: Harlon Ditty CMA (AAMA), [name]     Status post total replacement of right hip 10/12/2018   Vertigo 01/02/2014    Past Surgical History:  Procedure Laterality Date   ATHERECTOMY Right 05/15/2013   Procedure: ATHERECTOMY;  Surgeon: Lorretta Harp, MD;  Location: [facility] CATH LAB;  Service: Cardiovascular;  Laterality: Right;  popliteal   BACK SURGERY     CARDIAC CATHETERIZATION  07/17/01   2 v CAD with LM, circ, LAD, obtuse diag, mod stenosis of diag vein graft , mild stenosis of marg vein graft, nl EF, medical treatment   CARDIAC CATHETERIZATION  11/2013   Widely patent LIMA-LAD & SVG-OM with  mild progression of proximal stenosis ~40-50% in SVG-D1; Widely patent native RCA with known severe native LCA disease   CARDIAC CATHETERIZATION N/A 11/01/2016   Procedure: Left Heart Cath and Cors/Grafts Angiography;  Surgeon: [name], MD;  Location: [facility] CV LAB;  Service: Cardiovascular: Hemodynamics: High LVEDP!.  Cors/Grafts: LM 55%, o-p LAD 100% then after D1 -> LIMA-LAD patent, SVG-Diag ~55%. small RI wiht ~70% ostial. O-p Cx 80% - pCx 70% --> SVG-bifurcating OM patent. -- med Rx   CORONARY ANGIOPLASTY  1992   PCI to LAD   CORONARY ARTERY BYPASS GRAFT  1998   LIMA-LAD, SVG-diagonal (none roughly 50% stenosis), SVG-OM   LEFT HEART CATHETERIZATION WITH CORONARY ANGIOGRAM N/A 11/27/2013   Procedure: LEFT HEART CATHETERIZATION WITH CORONARY ANGIOGRAM;  Surgeon: [name], MD;  Location: [facility] CATH LAB;  Service: Cardiovascular;  Laterality: N/A;   LOW EXTREMITY DOPPLERS/ABI  10/2016   Patent right SFA, popliteal and peroneal tendons. Patent left SFA and popliteal stent. 30-50% stenosis in the right common femoral and popliteal arteries, 50-74% stenosis in left profunda femoris artery. --> 1 year follow-up.  RABI - 1.2, LABI 1.1   LOWER EXTREMITY ANGIOGRAM N/A 02/22/2013   Procedure: LOWER EXTREMITY ANGIOGRAM;  Surgeon: Lorretta Harp, MD;  Location: [facility] CATH LAB;  Service: Cardiovascular;  Laterality: N/A;   LOWER EXTREMITY ANGIOGRAM N/A 07/20/2013   Procedure: LOWER EXTREMITY ANGIOGRAM;  Surgeon: Lorretta Harp, MD;  Location: [facility] CATH LAB;  Service: Cardiovascular;  Laterality: N/A;   NM MYOVIEW LTD  04/03/2013; 5/26'/2016   Lexiscan: a)  Apical perfusion defect with no ischemia. Consider prior infarct versus breast attenuation.;; b) 5/'16: LOW RISK, Nl EF ~55-65%, No Ischemia /Infarction   NM MYOVIEW LTD  10/'19; 4/'21   a) LOW RISK.  EF 55%.  No ischemia or infarction. b) EF estimated 81%.  Suboptimal images.  May be subtle inferior ischemia-initially read by radiology as  intermediate risk, however overread by cardiology to be mild low risk.Marland Kitchen   PERCUTANEOUS STENT INTERVENTION Right 05/15/2013   Procedure: PERCUTANEOUS STENT INTERVENTION;  Surgeon: Lorretta Harp, MD;  Location: [facility] CATH LAB;  Service: Cardiovascular;  Laterality: Right;  tibioperoneal trunk   Peroneal Artery Stent  05/15/13   Diamondback rot. atherectomy of high grade segmental popliteal stenosis then Chocolate balloonPTA; then angiosculpt PTA of the prox peroneal with stent-Xpedition    pv angio  07/20/13   high-grade calcified popliteal disease with one-vessel runoff via a small anterior tibial artery that has proximal disease as well, no intervention   TOTAL HIP ARTHROPLASTY Right 10/12/2018   Procedure: RIGHT TOTAL HIP ARTHROPLASTY ANTERIOR APPROACH;  Surgeon: Mcarthur Rossetti, MD;  Location: [facility] ORS;  Service: Orthopedics;  Laterality: Right;   TOTAL HIP ARTHROPLASTY Left 11/21/2020   Procedure: LEFT  TOTAL HIP ARTHROPLASTY ANTERIOR APPROACH;  Surgeon: Mcarthur Rossetti, MD;  Location: [facility] ORS;  Service: Orthopedics;  Laterality: Left;   TRANSTHORACIC ECHOCARDIOGRAM  11/'17; 10/19   a) EF 55-60%. Gr 1 DD. Normal PAP. Aortic Sclerosis.;; b) Normal LV size and function with vigorous EF 65 to 70%.  GR 1 DD.  Severe LA dilation (suggestive of probably worsening: GR 1 DD).   TRANSTHORACIC ECHOCARDIOGRAM  03/18/2020   EF 70-75%.  Hyperdynamic.  GR 1 DD.  No R WMA.  Mild LA dilation.  Mild aortic valve sclerosis but no stenosis.-No change from prior echo   TUBAL LIGATION      There were no vitals filed for this visit.   Subjective Assessment - 10/05/21 1312     Subjective Pt. indicated 4/10 or so.  Worse c lying down or getting up.  Pt. indicated her leg feels heavy.  Complaints in Lt knee noted at times during intervention (laterally)    Pertinent History PMH: CAD s/p CABG, CHF, PAD with claudication, DM with neuropathy, HTN, HLD, NSTEMI, RA, bil THA, hx back surgery    Limitations  Walking;Standing    How long can you walk comfortably? to mailbox    Patient Stated Goals Reduce pain, walk better    Currently in Pain? Yes    Pain Score 4     Pain Location Hip    Pain Orientation Left    Pain Type Chronic pain    Pain Onset More than a month ago    Pain Frequency Intermittent    Aggravating Factors  getting up, lying on it    Pain Relieving Factors trying to rub it now, heat                [facility] PT Assessment - 10/05/21 0001       Strength   Right Hip Flexion 4+/5    Left Hip Flexion 4+/5    Left Knee Extension 4+/5                           [facility] Adult PT Treatment/Exercise - 10/05/21 0001       Knee/Hip Exercises: Aerobic   Nustep UE/LE lvl 5 7 mins      Knee/Hip Exercises: Supine   Bridges 2 sets;10 reps;Both    Other Supine Knee/Hip Exercises supine sciatic nerve flossing (knee extension c ankle Df, then reverse)  2x10    Other Supine Knee/Hip Exercises supine tband clam shell 2 x 10 bilateral (blue)      Manual Therapy   Manual therapy comments held due to patient request                       PT Short Term Goals - 09/28/21 1340       PT SHORT TERM GOAL #1   Title Patient will demonstrate independent use of home exercise program to maintain progress from in clinic treatments.    Time 3    Period Weeks    Status Not Met    Target Date 09/28/21               PT Long Term Goals - 09/30/21 1325       PT LONG TERM GOAL #1   Title Patient will demonstrate/report pain at worst less than or equal to 2/10 to facilitate minimal limitation in daily activity secondary to pain symptoms.    Time 10    Period  Weeks    Status On-going    Target Date 11/16/21      PT LONG TERM GOAL #2   Title Patient will demonstrate independent use of home exercise program to facilitate ability to maintain/progress functional gains from skilled physical therapy services.    Time 10    Period Weeks    Status On-going     Target Date 11/16/21      PT LONG TERM GOAL #3   Title Pt. will demonstrate FOTO outcome > or = 60% to indicated reduced disability due to condition.    Time 10    Period Weeks    Status On-going    Target Date 11/16/21      PT LONG TERM GOAL #4   Title Pt. will demonstrate 5x sit to stand < 20 seconds to facilitate reduction in fall risk and improved functional mobility capability.    Time 10    Period Weeks    Status On-going    Target Date 11/16/21      PT LONG TERM GOAL #5   Title Pt. will demonstrate ability to walk independently > 500 ft to facilitate community ambulation, mailbox ambulation s fatigue.    Time 10    Period Weeks    Status On-going    Target Date 11/16/21      PT LONG TERM GOAL #6   Title Pt. will demonstrate bilateral hip MMT > = 4/5, knee 5/5 bilateral to facilitate ability to perform walking, transfers at [facility].    Time 10    Period Weeks    Status On-going    Target Date 11/16/21                   Plan - 10/05/21 1321     Clinical Impression Statement Pt. indicated desire to not perform any pressure or soft tissue intervention today.  Pt. was not specific about any worsening from visit.  Pt was able to tolerate more pressure (self applied) today vs. other visits.  Continued complaints of pain aggravation in most movements as well as general fatigue reported throughout intervention.  Arrival time for appointment (late) as well as fatigue/symptoms continued to impact overall progression of interventions.  Pt. may continue to benefit from manual interventions to generate myofascial release as well as strengthening/endurance overall.    Personal Factors and Comorbidities Comorbidity 3+    Comorbidities PMH: CAD s/p CABG, CHF, PAD with claudication, DM with neuropathy, HTN, HLD, NSTEMI, RA, bil THA, hx back surgery    Examination-Activity Limitations Sleep;Squat;Bend;Stairs;Stand;Carry;Transfers;Lift;Locomotion Level    Examination-Participation  Restrictions Cleaning;Community Activity;Interpersonal Relationship;Meal Prep    Stability/Clinical Decision Making Evolving/Moderate complexity    Rehab Potential Fair   fair to good   PT Frequency 2x / week    PT Duration Other (comment)   10 weeks   PT Treatment/Interventions ADLs/Self Care Home Management;Cryotherapy;Electrical Stimulation;Iontophoresis 53m/ml Dexamethasone;Moist Heat;Balance training;Therapeutic exercise;Therapeutic activities;Functional mobility training;Stair training;Gait training;DME Instruction;Neuromuscular re-education;Patient/family education;Passive range of motion;Spinal Manipulations;Joint Manipulations;Dry needling;Taping;Manual techniques    PT Next Visit Plan General hip strengthening and mobility, progress LE strengthening bilateral - manual if allowed    PT Home Exercise Plan [id number]    Consulted and Agree with Plan of Care Patient             Patient will benefit from skilled therapeutic intervention in order to improve the following deficits and impairments:  Abnormal gait, Hypomobility, Pain, Decreased strength, Decreased activity tolerance, Decreased mobility, Difficulty walking, Decreased balance, Decreased range of  motion, Decreased coordination, Postural dysfunction, Impaired perceived functional ability, Improper body mechanics, Decreased endurance  Visit Diagnosis: Pain in left leg  Muscle weakness (generalized)  Difficulty in walking, not elsewhere classified     Problem List Patient Active Problem List   Diagnosis Date Noted   Chronic diastolic heart failure ([place]) 01/07/2021   Status post total hip replacement, right 12/04/2020   Status post total replacement of left hip 11/21/2020   Near syncope 07/10/2020   Hx of CABG 07/10/2020   Preoperative cardiovascular examination 04/15/2020   Chest pain 03/18/2020   Unilateral primary osteoarthritis, left hip 04/03/2019   History of right hip replacement 04/03/2019   Status post total  replacement of right hip 10/12/2018   Unilateral primary osteoarthritis, right hip 09/12/2018   Degenerative joint disease 05/30/2018   Anal fissure 11/10/2017   Rectal bleeding 11/10/2017   Rectal pain 11/10/2017   Inflammatory arthritis 10/13/2017   Latent tuberculosis by blood test 10/13/2017   Chronic diastolic CHF (congestive heart failure), NYHA class 2 ([place]) 08/11/2017   Fatigue due to treatment 01/09/2017   NSTEMI (non-ST elevated myocardial infarction) ([place]) 11/01/2016   Diabetes mellitus ([place]) 10/24/2015   Non-insulin dependent type 2 diabetes mellitus ([place]) 10/24/2015   Angina, class I ([place]) 04/22/2015   DOE (dyspnea on exertion) 04/22/2015   Bilateral leg edema 04/22/2015   Accumulation of fluid in tissues 03/21/2014   Vertigo, central 01/02/2014   Vertigo 01/02/2014   Limb pain- echymosis, pain Lt radial artery after cath 12/11/2013   Dyslipidemia, goal LDL below 70 05/17/2013   Essential hypertension 05/17/2013   Claudication ([place]) 05/16/2013   PTA/ Stent Rt popliteal and Rt peroneal artery 05/16/13 05/16/2013   PAD,severe calcified pop and tibial peroneal trunk disease bilateraly    ABDOMINAL PAIN RIGHT LOWER QUADRANT 10/07/2010   CVA-STROKE 03/04/2010   BARRETT'S ESOPHAGUS 03/04/2010   FECAL INCONTINENCE 03/04/2010   DM 01/14/2010   Anemia of chronic disease 01/14/2010   Atherosclerotic heart disease of native coronary artery with angina pectoris ([place]) 01/14/2010   DIVERTICULOSIS OF COLON 01/14/2010   PERSONAL HISTORY OF COLONIC POLYPS 01/14/2010   SHELLFISH ALLERGY 01/14/2010    Scot Jun, PT, DPT, OCS, ATC 10/05/21  1:40 PM    [facility] [address] [place], [name], [address] Phone: [phone]   Fax:  [phone]  Name: CYRA SPADER MRN: [id number] Date of Birth: [birth year]

## 2021-10-07 ENCOUNTER — Encounter: Payer: Self-pay | Admitting: Orthopaedic Surgery

## 2021-10-07 ENCOUNTER — Ambulatory Visit (INDEPENDENT_AMBULATORY_CARE_PROVIDER_SITE_OTHER): Payer: Medicare Other | Admitting: Orthopaedic Surgery

## 2021-10-07 ENCOUNTER — Encounter: Payer: Self-pay | Admitting: Rehabilitative and Restorative Service Providers"

## 2021-10-07 ENCOUNTER — Ambulatory Visit (INDEPENDENT_AMBULATORY_CARE_PROVIDER_SITE_OTHER): Payer: Medicare Other | Admitting: Rehabilitative and Restorative Service Providers"

## 2021-10-07 ENCOUNTER — Other Ambulatory Visit: Payer: Self-pay

## 2021-10-07 DIAGNOSIS — M6281 Muscle weakness (generalized): Secondary | ICD-10-CM | POA: Diagnosis not present

## 2021-10-07 DIAGNOSIS — R262 Difficulty in walking, not elsewhere classified: Secondary | ICD-10-CM

## 2021-10-07 DIAGNOSIS — M79605 Pain in left leg: Secondary | ICD-10-CM

## 2021-10-07 DIAGNOSIS — Z96642 Presence of left artificial hip joint: Secondary | ICD-10-CM | POA: Diagnosis not present

## 2021-10-07 DIAGNOSIS — M25552 Pain in left hip: Secondary | ICD-10-CM

## 2021-10-07 NOTE — Progress Notes (Signed)
The patient reports that she has been getting around better with physical therapy.  They have helped try to strengthen her legs on both sides.  She is 87 years old.  She has been in the ER twice in the last 2 months with syncopal type episodes.  She said she has been working on hydration.  She does feel that therapy has helped her quite a bit with her mobility.  Both operative hips move smoothly and fluidly.  Her previous x-rays from August showed well-seated total hip arthroplasties with no complicating features.  I did feel that she was dealing with some IT band pain and trochanteric pain.  It seems to be less today and her hips are moving well.  My standpoint I will have any other recommendations other than continuing through physical therapy per their recommendations.  Follow-up can be as needed

## 2021-10-07 NOTE — Therapy (Addendum)
[facility] [address] [place], [name], [address] Phone: [phone]   Fax:  [phone]  Physical Therapy Treatment /Discharge  Patient Details  Name: [name] MRN: [id number] Date of Birth: 08/20/1933 Referring Provider (PT): Mcarthur Rossetti, MD   Encounter Date: 10/07/2021   PT End of Session - 10/07/21 1308     Visit Number 5    Number of Visits 20    Date for PT Re-Evaluation 11/16/21    Authorization Type U[employer] Medicare 20% co    Progress Note Due on Visit 10    PT Start Time 1303    PT Stop Time 1342    PT Time Calculation (min) 39 min    Activity Tolerance Patient limited by pain;Patient limited by fatigue    Behavior During Therapy [facility] for tasks assessed/performed             Past Medical History:  Diagnosis Date   Allergy to IVP dye    Angina, class I ([place]) 04/22/2015   Atherosclerotic heart disease native coronary artery w/angina pectoris ([place]) 1992   a. 1992 s/p PTCA of LAD;  b. 1998 CABG x 3; c. 07/2001 Cath: Sev LM/LAD/LCX dzs, 3/3 patent grafts; d. 11/2013 Cath: stable graft anatomy; e. 10/2016 Cath: native 3VD, VG->OM nl, LIMA->LAD nl, VG->Diag 55.   Atherosclerotic heart disease of native coronary artery with angina pectoris ([place]) 01/14/2010   Qualifier: Diagnosis of  By: Deatra Ina MD, Sandy Salaam    Atrial septal aneurysm    Barrett's esophagus 03/04/2010   Qualifier: Diagnosis of  By: Shane Crutch, Amy S    Bilateral leg edema 04/22/2015   Bradycardia, mild briefly to the 40s, asymptomatic 05/16/2013   Carotid arterial disease ([place])    a. 05/2014 Carotid U/S: No signif bilat ICA stenosis, >60% L ECA.   Chronic anemia    Chronic diastolic CHF (congestive heart failure) ([place])    a. 10/2016 Echo: Ef 55-60%, no rwma, Gr1 DD, triv AI, PASP 51mHg, atrial septal aneurysm.   Chronic diastolic CHF (congestive heart failure), NYHA class 2 ([place] 08/11/2017   Claudication ([place] 05/16/2013   Peripheral arterial  occlusive disease with lifestyle limiting claudication    Degenerative joint disease 05/30/2018   DIVERTICULOSIS OF COLON 01/14/2010   Qualifier: Diagnosis of  By: KDeatra InaMD, RSandy Salaam   DM (diabetes mellitus), type 2 with peripheral vascular complications ([place]    On Oral medications.   DOE (dyspnea on exertion) 04/22/2015   Essential hypertension    FECAL INCONTINENCE 03/04/2010   Qualifier: Diagnosis of  By: ETrellis PaganiniPA-c, Amy S    GERD (gastroesophageal reflux disease)    Hyperlipidemia with target LDL less than 70    Inflammatory arthritis 10/13/2017   Neuropathy    Non-insulin dependent type 2 diabetes mellitus ([place] 10/24/2015   NSTEMI (non-ST elevated myocardial infarction) ([place] 11/01/2016   PAD (peripheral artery disease) ([place] 05/2013; 09/2013   a. severe calcified POP-1 & TP Trunk Dz bilat;  b . 05/2013 Diamondback Rot Athrectomy (R POP) --> PTA R Pop, DES to R Peroneal;  c. 09/2013 PTA/Stenting of L Pop ([name]- retrograde access);  d. 04/2014 ABI's: R/L: 1.1/1.1.   Preoperative cardiovascular examination 04/15/2020   PUD (peptic ulcer disease)    Rheumatoid arthritis ([place]    S/P CABG x 3 1998   LIMA-LAD, SVG-OM, SVG-D1   Severe claudication ([place] 05/2013   Referred for Peripheral Angio (Dr. BGwenlyn Found--> Dr. [place]runetta Jeans[place]  [place] ALLERGY 01/14/2010   Qualifier: Diagnosis of  By: Harlon Ditty CMA (AAMA), [name]     Status post total replacement of right hip 10/12/2018   Vertigo 01/02/2014    Past Surgical History:  Procedure Laterality Date   ATHERECTOMY Right 05/15/2013   Procedure: ATHERECTOMY;  Surgeon: Lorretta Harp, MD;  Location: [facility] C[facility] LAB;  Service: Cardiovascular;  Laterality: Right;  popliteal   BACK SURGERY     CARDIAC CATHETERIZATION  07/17/01   2 v CAD with LM, circ, LAD, obtuse diag, mod stenosis of diag vein graft , mild stenosis of marg vein graft, nl EF, medical treatment   CARDIAC CATHETERIZATION  11/2013   Widely patent LIMA-LAD &  SVG-OM with mild progression of proximal stenosis ~40-50% in SVG-D1; Widely patent native RCA with known severe native LCA disease   CARDIAC CATHETERIZATION N/A 11/01/2016   Procedure: Left Heart Cath and Cors/Grafts Angiography;  Surgeon: [name], MD;  Location: [facility] CV LAB;  Service: Cardiovascular: Hemodynamics: High LVEDP!.  Cors/Grafts: LM 55%, o-p LAD 100% then after D1 -> LIMA-LAD patent, SVG-Diag ~55%. small RI wiht ~70% ostial. O-p Cx 80% - pCx 70% --> SVG-bifurcating OM patent. -- med Rx   CORONARY ANGIOPLASTY  1992   PCI to LAD   CORONARY ARTERY BYPASS GRAFT  1998   LIMA-LAD, SVG-diagonal (none roughly 50% stenosis), SVG-OM   LEFT HEART CATHETERIZATION WITH CORONARY ANGIOGRAM N/A 11/27/2013   Procedure: LEFT HEART CATHETERIZATION WITH CORONARY ANGIOGRAM;  Surgeon: [name], MD;  Location: [facility] CATH LAB;  Service: Cardiovascular;  Laterality: N/A;   LOW EXTREMITY DOPPLERS/ABI  10/2016   Patent right SFA, popliteal and peroneal tendons. Patent left SFA and popliteal stent. 30-50% stenosis in the right common femoral and popliteal arteries, 50-74% stenosis in left profunda femoris artery. --> 1 year follow-up.  RABI - 1.2, LABI 1.1   LOWER EXTREMITY ANGIOGRAM N/A 02/22/2013   Procedure: LOWER EXTREMITY ANGIOGRAM;  Surgeon: Lorretta Harp, MD;  Location: [facility] CATH LAB;  Service: Cardiovascular;  Laterality: N/A;   LOWER EXTREMITY ANGIOGRAM N/A 07/20/2013   Procedure: LOWER EXTREMITY ANGIOGRAM;  Surgeon: Lorretta Harp, MD;  Location: [facility] CATH LAB;  Service: Cardiovascular;  Laterality: N/A;   NM MYOVIEW LTD  04/03/2013; 5/26'/2016   Lexiscan: a)  Apical perfusion defect with no ischemia. Consider prior infarct versus breast attenuation.;; b) 5/'16: LOW RISK, Nl EF ~55-65%, No Ischemia /Infarction   NM MYOVIEW LTD  10/'19; 4/'21   a) LOW RISK.  EF 55%.  No ischemia or infarction. b) EF estimated 81%.  Suboptimal images.  May be subtle inferior ischemia-initially read by  radiology as intermediate risk, however overread by cardiology to be mild low risk.Marland Kitchen   PERCUTANEOUS STENT INTERVENTION Right 05/15/2013   Procedure: PERCUTANEOUS STENT INTERVENTION;  Surgeon: Lorretta Harp, MD;  Location: [facility] CATH LAB;  Service: Cardiovascular;  Laterality: Right;  tibioperoneal trunk   Peroneal Artery Stent  05/15/13   Diamondback rot. atherectomy of high grade segmental popliteal stenosis then Chocolate balloonPTA; then angiosculpt PTA of the prox peroneal with stent-Xpedition    pv angio  07/20/13   high-grade calcified popliteal disease with one-vessel runoff via a small anterior tibial artery that has proximal disease as well, no intervention   TOTAL HIP ARTHROPLASTY Right 10/12/2018   Procedure: RIGHT TOTAL HIP ARTHROPLASTY ANTERIOR APPROACH;  Surgeon: Mcarthur Rossetti, MD;  Location: [facility] ORS;  Service: Orthopedics;  Laterality: Right;   TOTAL HIP ARTHROPLASTY Left 11/21/2020   Procedure:  LEFT TOTAL HIP ARTHROPLASTY ANTERIOR APPROACH;  Surgeon: Mcarthur Rossetti, MD;  Location: [facility] ORS;  Service: Orthopedics;  Laterality: Left;   TRANSTHORACIC ECHOCARDIOGRAM  11/'17; 10/19   a) EF 55-60%. Gr 1 DD. Normal PAP. Aortic Sclerosis.;; b) Normal LV size and function with vigorous EF 65 to 70%.  GR 1 DD.  Severe LA dilation (suggestive of probably worsening: GR 1 DD).   TRANSTHORACIC ECHOCARDIOGRAM  03/18/2020   EF 70-75%.  Hyperdynamic.  GR 1 DD.  No R WMA.  Mild LA dilation.  Mild aortic valve sclerosis but no stenosis.-No change from prior echo   TUBAL LIGATION      There were no vitals filed for this visit.   Subjective Assessment - 10/07/21 1306     Subjective Pt. indicated complaints in Lt hip around 3/10 or so.  Pt. indicated today's weather has her achy all over.  Complaint of distal lateral shin pressure/pain c standing noted today (previously not specifically reported)    Pertinent History PMH: CAD s/p CABG, CHF, PAD with claudication, DM with neuropathy, HTN,  HLD, NSTEMI, RA, bil THA, hx back surgery    Limitations Walking;Standing    How long can you walk comfortably? to mailbox    Patient Stated Goals Reduce pain, walk better    Currently in Pain? Yes    Pain Score 3     Pain Location Hip    Pain Orientation Left    Pain Descriptors / Indicators Sore;Tender;Aching    Pain Type Chronic pain    Pain Radiating Towards Lt thigh    Pain Onset More than a month ago    Pain Frequency Intermittent    Aggravating Factors  weather, pressure on hip    Pain Relieving Factors nothing specific reported.                               [place] Adult PT Treatment/Exercise - 10/07/21 0001       Exercises   Other Exercises  Education on reasoning and progression plans for HEP and in clinic education.  Cues required for encouragement of participation.      Knee/Hip Exercises: Aerobic   Nustep UE/LE lvl 5 5 mins, rest break, 5 mins      Knee/Hip Exercises: Standing   Forward Step Up Step Height: 4";Hand Hold: 2;Both;10 reps    Other Standing Knee Exercises standing hip abduction 2 x 10 bilateral (bilateral hands on bar)      Knee/Hip Exercises: Seated   Sit to Sand without UE support;2 sets;5 reps                       PT Short Term Goals - 09/28/21 1340       PT SHORT TERM GOAL #1   Title Patient will demonstrate independent use of home exercise program to maintain progress from in clinic treatments.    Time 3    Period Weeks    Status Not Met    Target Date 09/28/21               PT Long Term Goals - 09/30/21 1325       PT LONG TERM GOAL #1   Title Patient will demonstrate/report pain at worst less than or equal to 2/10 to facilitate minimal limitation in daily activity secondary to pain symptoms.    Time 10    Period Weeks    Status  On-going    Target Date 11/16/21      PT LONG TERM GOAL #2   Title Patient will demonstrate independent use of home exercise program to facilitate ability to  maintain/progress functional gains from skilled physical therapy services.    Time 10    Period Weeks    Status On-going    Target Date 11/16/21      PT LONG TERM GOAL #3   Title Pt. will demonstrate FOTO outcome > or = 60% to indicated reduced disability due to condition.    Time 10    Period Weeks    Status On-going    Target Date 11/16/21      PT LONG TERM GOAL #4   Title Pt. will demonstrate 5x sit to stand < 20 seconds to facilitate reduction in fall risk and improved functional mobility capability.    Time 10    Period Weeks    Status On-going    Target Date 11/16/21      PT LONG TERM GOAL #5   Title Pt. will demonstrate ability to walk independently > 500 ft to facilitate community ambulation, mailbox ambulation s fatigue.    Time 10    Period Weeks    Status On-going    Target Date 11/16/21      PT LONG TERM GOAL #6   Title Pt. will demonstrate bilateral hip MMT > = 4/5, knee 5/5 bilateral to facilitate ability to perform walking, transfers at [facility].    Time 10    Period Weeks    Status On-going    Target Date 11/16/21                   Plan - 10/07/21 1319     Clinical Impression Statement Continued to implement to improve bilateral lower extremity strength and endurance for general progressive mobility improvements (fair tolerance).  Time spent in education of POC regarding progressive strengthening and endurance to promote improved activity tolerance c activity to help reverse deconditioning that has occurred over time.    Personal Factors and Comorbidities Comorbidity 3+    Comorbidities PMH: CAD s/p CABG, CHF, PAD with claudication, DM with neuropathy, HTN, HLD, NSTEMI, RA, bil THA, hx back surgery    Examination-Activity Limitations Sleep;Squat;Bend;Stairs;Stand;Carry;Transfers;Lift;Locomotion Level    Examination-Participation Restrictions Cleaning;Community Activity;Interpersonal Relationship;Meal Prep    Stability/Clinical Decision Making  Evolving/Moderate complexity    Rehab Potential Fair   fair to good   PT Frequency 2x / week    PT Duration Other (comment)   10 weeks   PT Treatment/Interventions ADLs/Self Care Home Management;Cryotherapy;Electrical Stimulation;Iontophoresis 64m/ml Dexamethasone;Moist Heat;Balance training;Therapeutic exercise;Therapeutic activities;Functional mobility training;Stair training;Gait training;DME Instruction;Neuromuscular re-education;Patient/family education;Passive range of motion;Spinal Manipulations;Joint Manipulations;Dry needling;Taping;Manual techniques    PT Next Visit Plan hip strengthening and mobility, progress LE strengthening bilateral - manual if allowed    PT Home Exercise Plan [id number]    Consulted and Agree with Plan of Care Patient             Patient will benefit from skilled therapeutic intervention in order to improve the following deficits and impairments:  Abnormal gait, Hypomobility, Pain, Decreased strength, Decreased activity tolerance, Decreased mobility, Difficulty walking, Decreased balance, Decreased range of motion, Decreased coordination, Postural dysfunction, Impaired perceived functional ability, Improper body mechanics, Decreased endurance  Visit Diagnosis: Pain in left leg  Muscle weakness (generalized)  Difficulty in walking, not elsewhere classified     Problem List Patient Active Problem List   Diagnosis Date Noted  Chronic diastolic heart failure ([place]) 01/07/2021   Status post total hip replacement, right 12/04/2020   Status post total replacement of left hip 11/21/2020   Near syncope 07/10/2020   Hx of CABG 07/10/2020   Preoperative cardiovascular examination 04/15/2020   Chest pain 03/18/2020   Unilateral primary osteoarthritis, left hip 04/03/2019   History of right hip replacement 04/03/2019   Status post total replacement of right hip 10/12/2018   Unilateral primary osteoarthritis, right hip 09/12/2018   Degenerative joint disease  05/30/2018   Anal fissure 11/10/2017   Rectal bleeding 11/10/2017   Rectal pain 11/10/2017   Inflammatory arthritis 10/13/2017   Latent tuberculosis by blood test 10/13/2017   Chronic diastolic CHF (congestive heart failure), NYHA class 2 ([place]) 08/11/2017   Fatigue due to treatment 01/09/2017   NSTEMI (non-ST elevated myocardial infarction) ([place]) 11/01/2016   Diabetes mellitus ([place]) 10/24/2015   Non-insulin dependent type 2 diabetes mellitus ([place]) 10/24/2015   Angina, class I ([place]) 04/22/2015   DOE (dyspnea on exertion) 04/22/2015   Bilateral leg edema 04/22/2015   Accumulation of fluid in tissues 03/21/2014   Vertigo, central 01/02/2014   Vertigo 01/02/2014   Limb pain- echymosis, pain Lt radial artery after cath 12/11/2013   Dyslipidemia, goal LDL below 70 05/17/2013   Essential hypertension 05/17/2013   Claudication ([place]) 05/16/2013   PTA/ Stent Rt popliteal and Rt peroneal artery 05/16/13 05/16/2013   PAD,severe calcified pop and tibial peroneal trunk disease bilateraly    ABDOMINAL PAIN RIGHT LOWER QUADRANT 10/07/2010   CVA-STROKE 03/04/2010   BARRETT'S ESOPHAGUS 03/04/2010   FECAL INCONTINENCE 03/04/2010   DM 01/14/2010   Anemia of chronic disease 01/14/2010   Atherosclerotic heart disease of native coronary artery with angina pectoris ([place]) 01/14/2010   DIVERTICULOSIS OF COLON 01/14/2010   PERSONAL HISTORY OF COLONIC POLYPS 01/14/2010   SHELLFISH ALLERGY 01/14/2010    Scot Jun, PT, DPT, OCS, ATC 10/07/21  1:38 PM  PHYSICAL THERAPY DISCHARGE SUMMARY  Visits from Start of Care: 5  Current functional level related to goals / functional outcomes: See note   Remaining deficits: See note   Education / Equipment: HEP   Patient agrees to discharge. Patient goals were partially met. Patient is being discharged due to not returning since the last visit.  Scot Jun, PT, DPT, OCS, ATC 11/23/21  9:54 AM      [facility] [address] [place], [name], [address] Phone: [phone]   Fax:  [phone]  Name: [name] MRN: [id number] Date of Birth: 1933/02/03

## 2021-10-14 ENCOUNTER — Ambulatory Visit: Payer: Medicare Other

## 2021-10-14 NOTE — Progress Notes (Deleted)
10/14/2021 [name] Mar 16, 1933 [id number]   HPI:  [name] is a 87 y.o. female patient of Dr Ellyn Hack, with a PMH below who presents today for hypertension clinic evaluation.  She was seen by Dr. Gwenlyn Found in September to review her PAD and was found to have a BP of 185/84.  She was only on carvedilol, so he added amlodipine 5 mg daily and asked that she keep a BP log and see CVRR in 1 month.    Past Medical History: PAD Severely calcified popliteal and tibial peroneal trunk disease bilaterally  Hyperlipidemia 11/21 LDL 91 on rosuvastatin 40 and ezetimibe  CHF  Chronic diastolic, class II, on furosemide 40  ASCVD Prior stroke, 2017 cath 100% ostial LAD and proximal mid LAD after D1  DM2 11/21 A1c 7.3 on glimepiride 2 mg     Blood Pressure Goal:  130/80  Current Medications: amlodipine 5 mg daily, carvedilol 6.25 mg bid  Family Hx:  Social Hx:   Diet:   Exercise:   Home BP readings:   Intolerances: metformin - nausea, latex allergy  Labs:    Wt Readings from Last 3 Encounters:  09/23/21 149 lb (67.6 kg)  08/25/21 156 lb 15.8 oz (71.2 kg)  08/14/21 157 lb (71.2 kg)   BP Readings from Last 3 Encounters:  09/23/21 (!) 152/65  08/25/21 (!) 182/75  08/14/21 (!) 185/84   Pulse Readings from Last 3 Encounters:  09/23/21 (!) 58  08/25/21 (!) 50  08/14/21 71    Current Outpatient Medications  Medication Sig Dispense Refill   acetaminophen (TYLENOL) 650 MG CR tablet Take 650 mg by mouth every 8 (eight) hours as needed for pain.     acetaminophen-codeine (TYLENOL #3) 300-30 MG tablet Take 1-2 tablets by mouth every 8 (eight) hours as needed. 30 tablet 0   amLODipine (NORVASC) 5 MG tablet Take 1 tablet (5 mg total) by mouth daily. 90 tablet 3   carvedilol (COREG) 6.25 MG tablet TAKE 1 TABLET BY MOUTH TWICE A DAY 180 tablet 0   Cholecalciferol (VITAMIN D3) 10 MCG (400 UNIT) tablet Take 400 Units by mouth daily.     clopidogrel (PLAVIX) 75 MG tablet Take 75 mg by  mouth daily.     Coenzyme Q10 (CO Q 10 PO) Take 1 tablet by mouth every other day.      ezetimibe (ZETIA) 10 MG tablet Take 10 mg by mouth at bedtime.     folic acid (FOLVITE) 1 MG tablet Take 1 mg by mouth daily.  1   furosemide (LASIX) 40 MG tablet Take 40 Mg (1 tablet) daily 90 tablet 1   gabapentin (NEURONTIN) 400 MG capsule Take 400 mg by mouth 3 (three) times daily.     glimepiride (AMARYL) 1 MG tablet Take 2 mg by mouth daily with breakfast.  12   isosorbide mononitrate (IMDUR) 60 MG 24 hr tablet TAKE 1.5 TABLETS (90 MG TOTAL) BY MOUTH DAILY. . (Patient taking differently: Take 60 mg by mouth daily. Marland Kitchen) 135 tablet 3   methocarbamol (ROBAXIN) 500 MG tablet Take 1 tablet (500 mg total) by mouth every 8 (eight) hours as needed for muscle spasms. 30 tablet 0   methotrexate (RHEUMATREX) 2.5 MG tablet Take 20 mg by mouth every Friday. (8 tablets)  1   MULTIPLE VITAMIN-FOLIC ACID PO      nitroGLYCERIN (NITROSTAT) 0.4 MG SL tablet PLACE 1 TABLET (0.4 MG TOTAL) UNDER THE TONGUE EVERY 5 (FIVE) MINUTES AS NEEDED FOR  CHEST PAIN. 25 tablet 6   pantoprazole (PROTONIX) 40 MG tablet Take 1 tablet (40 mg total) by mouth 2 (two) times daily before a meal. 60 tablet 1   predniSONE (DELTASONE) 5 MG tablet Take 5 mg by mouth daily with breakfast.      rosuvastatin (CRESTOR) 40 MG tablet TAKE 1 TABLET BY MOUTH EVERYDAY AT BEDTIME 90 tablet 2   sulfaSALAzine (AZULFIDINE) 500 MG EC tablet Take 500 mg by mouth 2 (two) times daily.     No current facility-administered medications for this visit.    Allergies  Allergen Reactions   Fish Allergy Hives and Shortness Of Breath   Ivp Dye [Iodinated Diagnostic Agents] Shortness Of Breath and Anaphylaxis   Latex Anaphylaxis, Hives, Shortness Of Breath, Itching and Other (See Comments)    REACTION: wheezing   Shellfish Allergy Anaphylaxis, Hives, Shortness Of Breath and Other (See Comments)    All seafood   Evolocumab     Other reaction(s): latex on syringe   Other  Itching and Other (See Comments)    Patient reports allergy to perfumed detergents. Causes itching.   Metformin Nausea Only and Nausea And Vomiting    Past Medical History:  Diagnosis Date   Allergy to IVP dye    Angina, class I ([place]) 04/22/2015   Atherosclerotic heart disease native coronary artery w/angina pectoris ([place]) 1992   a. 1992 s/p PTCA of LAD;  b. 1998 CABG x 3; c. 07/2001 Cath: Sev LM/LAD/LCX dzs, 3/3 patent grafts; d. 11/2013 Cath: stable graft anatomy; e. 10/2016 Cath: native 3VD, VG->OM nl, LIMA->LAD nl, VG->Diag 55.   Atherosclerotic heart disease of native coronary artery with angina pectoris ([place]) 01/14/2010   Qualifier: Diagnosis of  By: Deatra Ina MD, Sandy Salaam    Atrial septal aneurysm    Barrett's esophagus 03/04/2010   Qualifier: Diagnosis of  By: Shane Crutch, Amy S    Bilateral leg edema 04/22/2015   Bradycardia, mild briefly to the 40s, asymptomatic 05/16/2013   Carotid arterial disease ([place])    a. 05/2014 Carotid U/S: No signif bilat ICA stenosis, >60% L ECA.   Chronic anemia    Chronic diastolic CHF (congestive heart failure) ([place])    a. 10/2016 Echo: Ef 55-60%, no rwma, Gr1 DD, triv AI, PASP 57mmHg, atrial septal aneurysm.   Chronic diastolic CHF (congestive heart failure), NYHA class 2 ([place]) 08/11/2017   Claudication ([place]) 05/16/2013   Peripheral arterial occlusive disease with lifestyle limiting claudication    Degenerative joint disease 05/30/2018   DIVERTICULOSIS OF COLON 01/14/2010   Qualifier: Diagnosis of  By: Deatra Ina MD, Sandy Salaam    DM (diabetes mellitus), type 2 with peripheral vascular complications ([place])    On Oral medications.   DOE (dyspnea on exertion) 04/22/2015   Essential hypertension    FECAL INCONTINENCE 03/04/2010   Qualifier: Diagnosis of  By: Trellis Paganini PA-c, Amy S    GERD (gastroesophageal reflux disease)    Hyperlipidemia with target LDL less than 70    Inflammatory arthritis 10/13/2017   Neuropathy    Non-insulin dependent type 2  diabetes mellitus ([place]) 10/24/2015   NSTEMI (non-ST elevated myocardial infarction) ([place]) 11/01/2016   PAD (peripheral artery disease) ([place]) 05/2013; 09/2013   a. severe calcified POP-1 & TP Trunk Dz bilat;  b . 05/2013 Diamondback Rot Athrectomy (R POP) --> PTA R Pop, DES to R Peroneal;  c. 09/2013 PTA/Stenting of L Pop [name] - retrograde access);  d. 04/2014 ABI's: R/L: 1.1/1.1.   Preoperative cardiovascular examination 04/15/2020  PUD (peptic ulcer disease)    Rheumatoid arthritis ([place])    S/P CABG x 3 1998   LIMA-LAD, SVG-OM, SVG-D1   Severe claudication ([place]) 05/2013   Referred for Peripheral Angio (Dr. Gwenlyn Found --> Dr. Brunetta Jeans in [place])   [place] 01/14/2010   Qualifier: Diagnosis of  By: Harlon Ditty CMA ([place]), [name]     Status post total replacement of right hip 10/12/2018   Vertigo 01/02/2014    There were no vitals taken for this visit.  No problem-specific Assessment & Plan notes found for this encounter.   Tommy Medal PharmD CPP [facility] [address] [place], [place] [address] [phone]

## 2021-10-30 IMAGING — US US PELVIS COMPLETE WITH TRANSVAGINAL
1 series · 14 of 25 positions shown · non-contrast
Comparison: Prior CT from 11/17/2019.

CLINICAL DATA: Initial evaluation for pelvic pain for 4 months.
History of prior hysterectomy.



[Series 1: us pelvis complete with transvaginal · 0.28mm/px · 43 acquisitions, 14 frames shown]
[im 1/43]
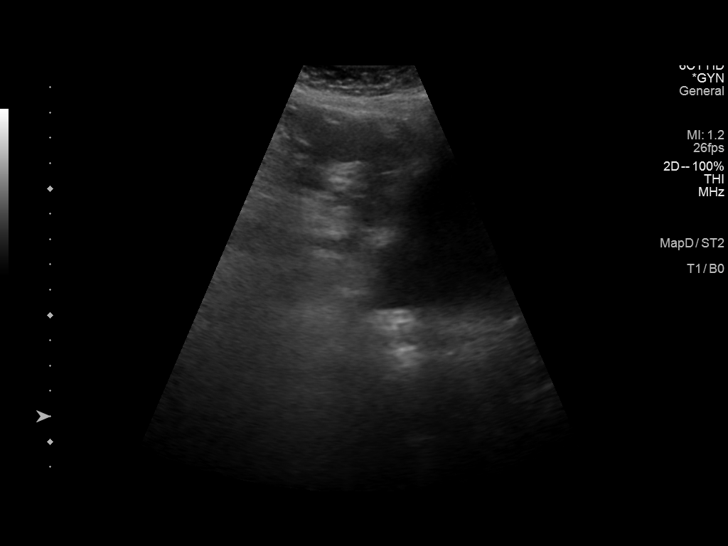
[im 4/43]
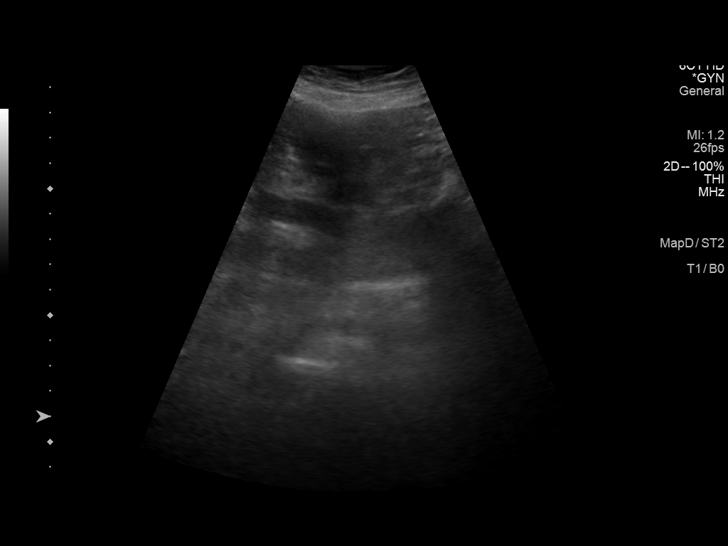
[im 8/43]
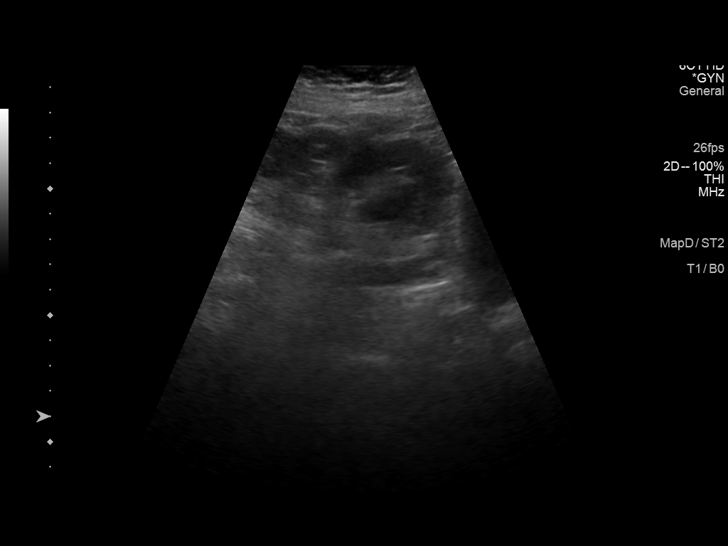
[im 11/43]
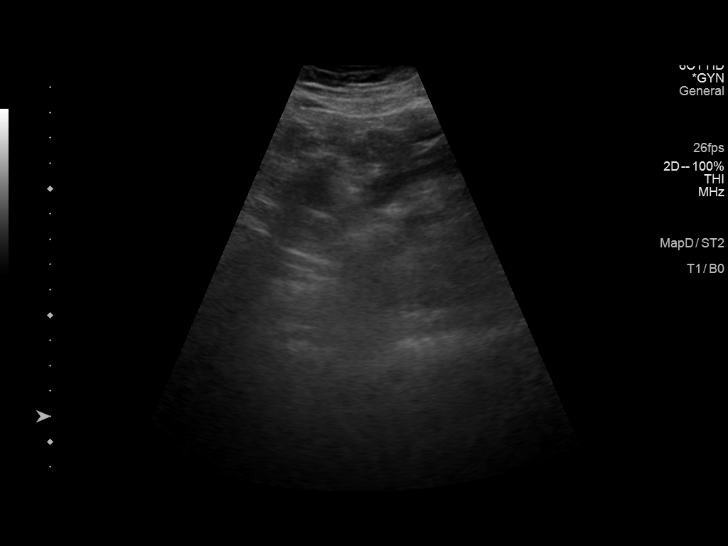
[im 15/43]
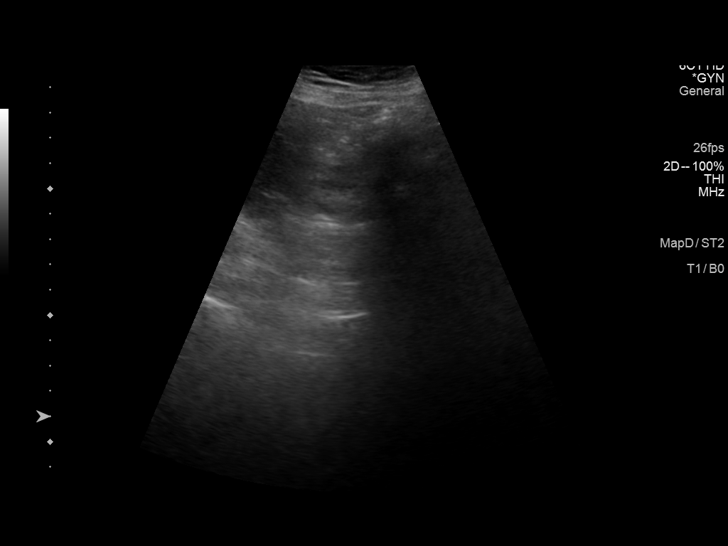
[im 16/43]
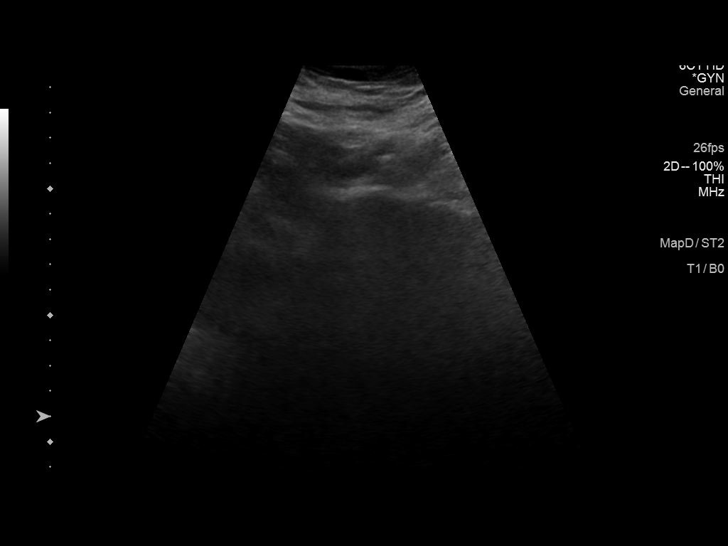
[im 20/43]
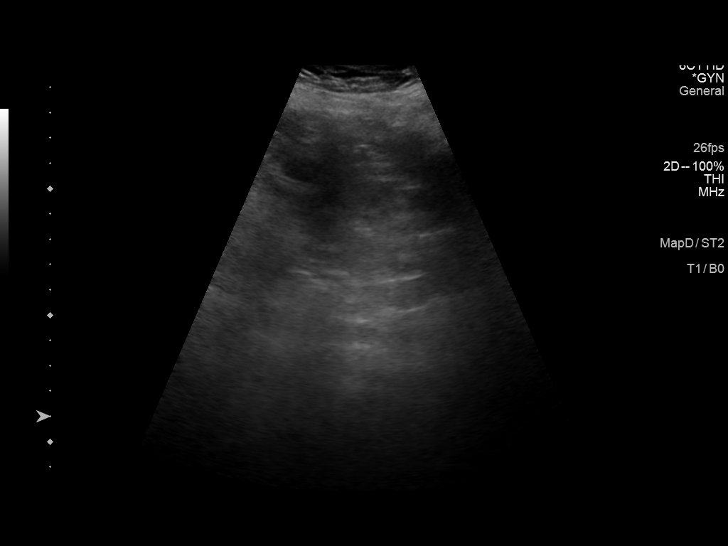
[im 23/43]
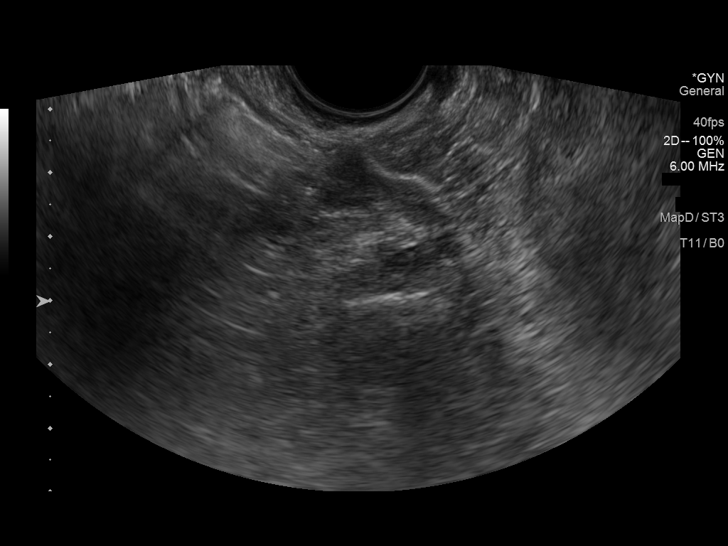
[im 27/43]
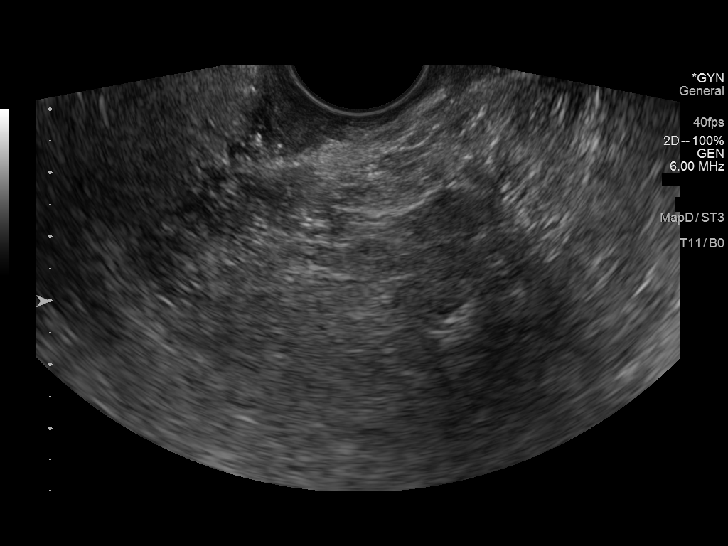
[im 29/43]
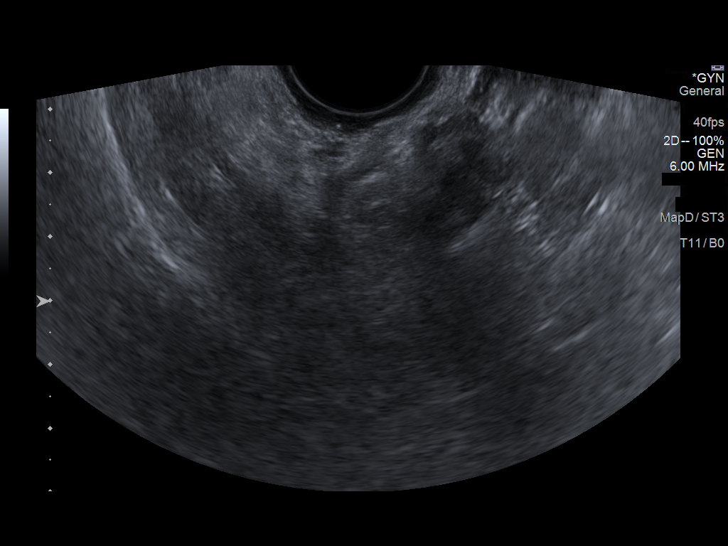
[im 32/43]
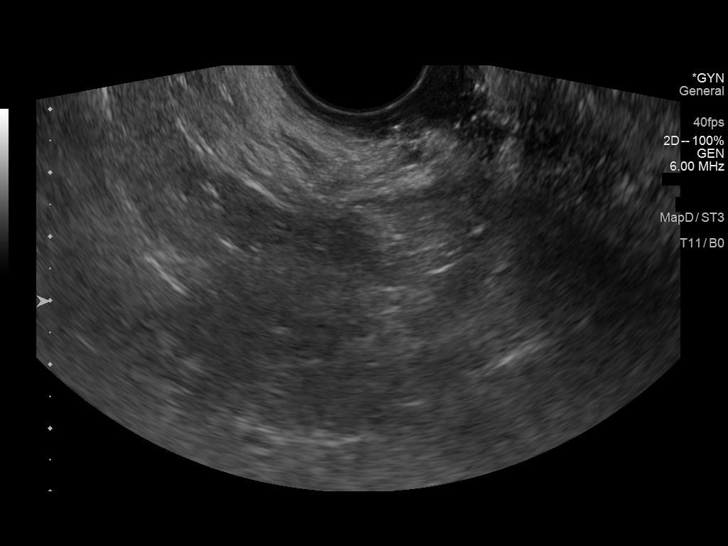
[im 36/43]
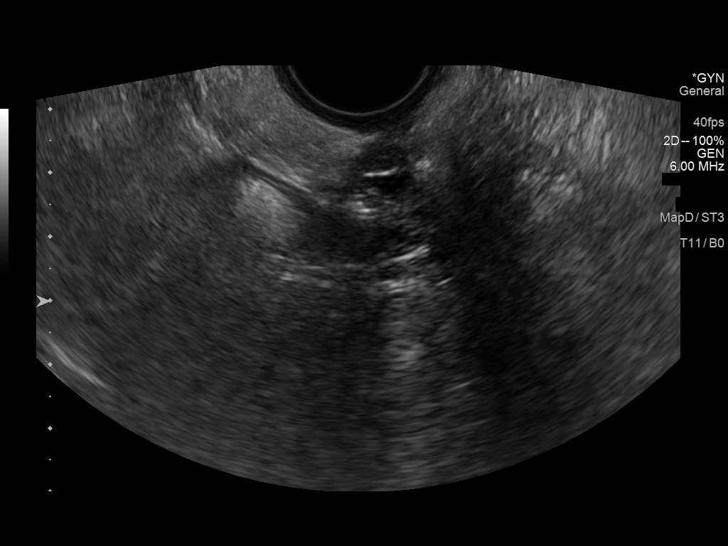
[im 39/43]
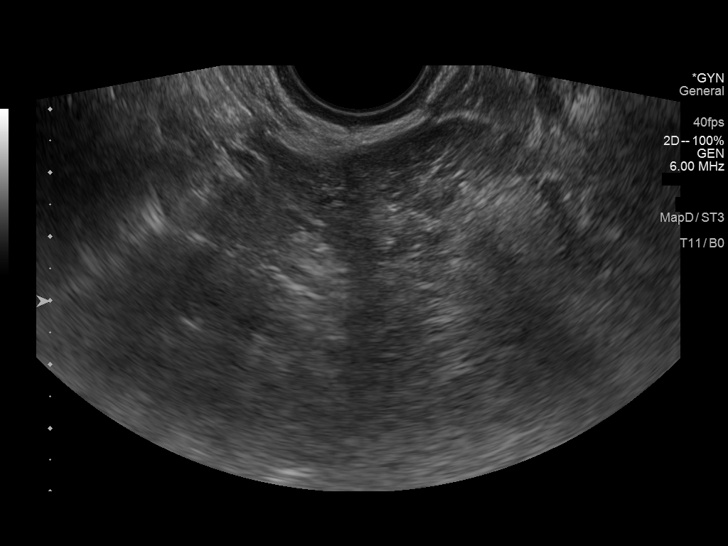
[im 43/43]
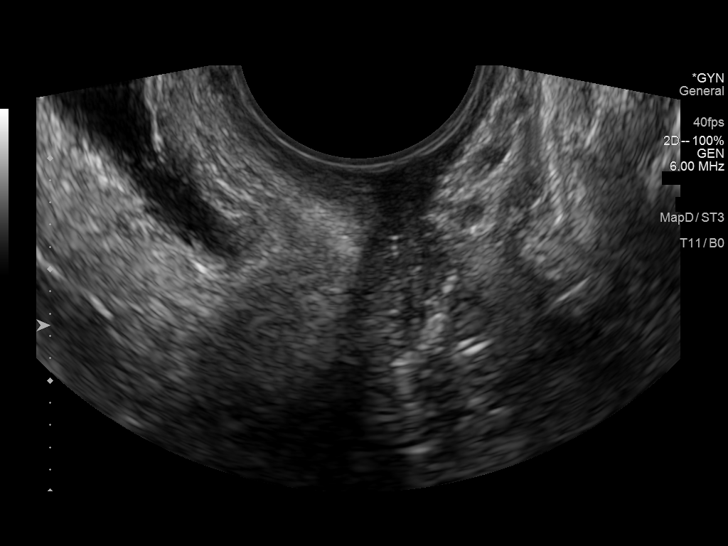

[14 of 25 positions shown; findings below may reference images not displayed]

FINDINGS: Uterus

Prior hysterectomy.  No abnormality about the vaginal cuff.

Endometrium

Surgically absent.

Right ovary

Not visualized.  No adnexal mass.

Left ovary

Not visualized.  No adnexal mass.

Other findings

No abnormal free fluid.
IMPRESSION: Prior hysterectomy with nonvisualization of either ovary. No pelvic
or adnexal mass or abnormal free fluid. No findings to explain
patient's symptoms identified.

## 2021-11-02 ENCOUNTER — Ambulatory Visit: Payer: Medicare Other

## 2021-11-02 NOTE — Progress Notes (Deleted)
11/02/2021 [name] 1933/08/27 [id number]   HPI:  [name] is a 87 y.o. female patient of Dr Gwenlyn Found, with a PMH below who presents today for hypertension clinic evaluation.  When she saw Dr. Gwenlyn Found in September her BP was noted to be 185/84, and he added amlodipine 5 mg daily.    Past Medical History: ASCVD S/P CABG 1988, prior stroke  PAD Multiple interventions starting in 2014, occluded R popliteal artery  hyperlipidemia LDL at 91 on rosuvastatin 40 and ezetimibe 10  CHF NYHA Class II, on carvedilol, isosorbide mono and furosemide  DM2 A1c 7.3 (12/21) on glimepiride 2 mg daily     Blood Pressure Goal:  130/80  Current Medications:  amlodipine 5 mg qd, carvedilol 6.25 mg bid  Family Hx:  Social Hx:   Diet:   Exercise:   Home BP readings:   Intolerances:   Labs:    Wt Readings from Last 3 Encounters:  09/23/21 149 lb (67.6 kg)  08/25/21 156 lb 15.8 oz (71.2 kg)  08/14/21 157 lb (71.2 kg)   BP Readings from Last 3 Encounters:  09/23/21 (!) 152/65  08/25/21 (!) 182/75  08/14/21 (!) 185/84   Pulse Readings from Last 3 Encounters:  09/23/21 (!) 58  08/25/21 (!) 50  08/14/21 71    Current Outpatient Medications  Medication Sig Dispense Refill   acetaminophen (TYLENOL) 650 MG CR tablet Take 650 mg by mouth every 8 (eight) hours as needed for pain.     acetaminophen-codeine (TYLENOL #3) 300-30 MG tablet Take 1-2 tablets by mouth every 8 (eight) hours as needed. 30 tablet 0   amLODipine (NORVASC) 5 MG tablet Take 1 tablet (5 mg total) by mouth daily. 90 tablet 3   carvedilol (COREG) 6.25 MG tablet TAKE 1 TABLET BY MOUTH TWICE A DAY 180 tablet 0   Cholecalciferol (VITAMIN D3) 10 MCG (400 UNIT) tablet Take 400 Units by mouth daily.     clopidogrel (PLAVIX) 75 MG tablet Take 75 mg by mouth daily.     Coenzyme Q10 (CO Q 10 PO) Take 1 tablet by mouth every other day.      ezetimibe (ZETIA) 10 MG tablet Take 10 mg by mouth at bedtime.     folic acid  (FOLVITE) 1 MG tablet Take 1 mg by mouth daily.  1   furosemide (LASIX) 40 MG tablet Take 40 Mg (1 tablet) daily 90 tablet 1   gabapentin (NEURONTIN) 400 MG capsule Take 400 mg by mouth 3 (three) times daily.     glimepiride (AMARYL) 1 MG tablet Take 2 mg by mouth daily with breakfast.  12   isosorbide mononitrate (IMDUR) 60 MG 24 hr tablet TAKE 1.5 TABLETS (90 MG TOTAL) BY MOUTH DAILY. . (Patient taking differently: Take 60 mg by mouth daily. Marland Kitchen) 135 tablet 3   methocarbamol (ROBAXIN) 500 MG tablet Take 1 tablet (500 mg total) by mouth every 8 (eight) hours as needed for muscle spasms. 30 tablet 0   methotrexate (RHEUMATREX) 2.5 MG tablet Take 20 mg by mouth every Friday. (8 tablets)  1   MULTIPLE VITAMIN-FOLIC ACID PO      nitroGLYCERIN (NITROSTAT) 0.4 MG SL tablet PLACE 1 TABLET (0.4 MG TOTAL) UNDER THE TONGUE EVERY 5 (FIVE) MINUTES AS NEEDED FOR CHEST PAIN. 25 tablet 6   pantoprazole (PROTONIX) 40 MG tablet Take 1 tablet (40 mg total) by mouth 2 (two) times daily before a meal. 60 tablet 1   predniSONE (DELTASONE) 5  MG tablet Take 5 mg by mouth daily with breakfast.      rosuvastatin (CRESTOR) 40 MG tablet TAKE 1 TABLET BY MOUTH EVERYDAY AT BEDTIME 90 tablet 2   sulfaSALAzine (AZULFIDINE) 500 MG EC tablet Take 500 mg by mouth 2 (two) times daily.     No current facility-administered medications for this visit.    Allergies  Allergen Reactions   Fish Allergy Hives and Shortness Of Breath   Ivp Dye [Iodinated Diagnostic Agents] Shortness Of Breath and Anaphylaxis   Latex Anaphylaxis, Hives, Shortness Of Breath, Itching and Other (See Comments)    REACTION: wheezing   Shellfish Allergy Anaphylaxis, Hives, Shortness Of Breath and Other (See Comments)    All seafood   Evolocumab     Other reaction(s): latex on syringe   Other Itching and Other (See Comments)    Patient reports allergy to perfumed detergents. Causes itching.   Metformin Nausea Only and Nausea And Vomiting    Past Medical  History:  Diagnosis Date   Allergy to IVP dye    Angina, class I ([place]) 04/22/2015   Atherosclerotic heart disease native coronary artery w/angina pectoris ([place]) 1992   a. 1992 s/p PTCA of LAD;  b. 1998 CABG x 3; c. 07/2001 Cath: Sev LM/LAD/LCX dzs, 3/3 patent grafts; d. 11/2013 Cath: stable graft anatomy; e. 10/2016 Cath: native 3VD, VG->OM nl, LIMA->LAD nl, VG->Diag 55.   Atherosclerotic heart disease of native coronary artery with angina pectoris ([place]) 01/14/2010   Qualifier: Diagnosis of  By: Deatra Ina MD, Sandy Salaam    Atrial septal aneurysm    Barrett's esophagus 03/04/2010   Qualifier: Diagnosis of  By: Shane Crutch, Amy S    Bilateral leg edema 04/22/2015   Bradycardia, mild briefly to the 40s, asymptomatic 05/16/2013   Carotid arterial disease ([place])    a. 05/2014 Carotid U/S: No signif bilat ICA stenosis, >60% L ECA.   Chronic anemia    Chronic diastolic CHF (congestive heart failure) ([place])    a. 10/2016 Echo: Ef 55-60%, no rwma, Gr1 DD, triv AI, PASP 89mmHg, atrial septal aneurysm.   Chronic diastolic CHF (congestive heart failure), NYHA class 2 ([place]) 08/11/2017   Claudication ([place]) 05/16/2013   Peripheral arterial occlusive disease with lifestyle limiting claudication    Degenerative joint disease 05/30/2018   DIVERTICULOSIS OF COLON 01/14/2010   Qualifier: Diagnosis of  By: Deatra Ina MD, Sandy Salaam    DM (diabetes mellitus), type 2 with peripheral vascular complications ([place])    On Oral medications.   DOE (dyspnea on exertion) 04/22/2015   Essential hypertension    FECAL INCONTINENCE 03/04/2010   Qualifier: Diagnosis of  By: Trellis Paganini PA-c, Amy S    GERD (gastroesophageal reflux disease)    Hyperlipidemia with target LDL less than 70    Inflammatory arthritis 10/13/2017   Neuropathy    Non-insulin dependent type 2 diabetes mellitus ([place]) 10/24/2015   NSTEMI (non-ST elevated myocardial infarction) ([place]) 11/01/2016   PAD (peripheral artery disease) ([place]) 05/2013; 09/2013   a.  severe calcified POP-1 & TP Trunk Dz bilat;  b . 05/2013 Diamondback Rot Athrectomy (R POP) --> PTA R Pop, DES to R Peroneal;  c. 09/2013 PTA/Stenting of L Pop [name] - retrograde access);  d. 04/2014 ABI's: R/L: 1.1/1.1.   Preoperative cardiovascular examination 04/15/2020   PUD (peptic ulcer disease)    Rheumatoid arthritis ([place])    S/P CABG x 3 1998   LIMA-LAD, SVG-OM, SVG-D1   Severe claudication ([place]) 05/2013   Referred for  Peripheral Angio (Dr. Gwenlyn Found --> Dr. Brunetta Jeans in [place])   [facility] ALLERGY 01/14/2010   Qualifier: Diagnosis of  By: Harlon Ditty CMA ([place]), [name]     Status post total replacement of right hip 10/12/2018   Vertigo 01/02/2014    There were no vitals taken for this visit.  No problem-specific Assessment & Plan notes found for this encounter.   Tommy Medal PharmD CPP [facility] [address] [place], [place] [address] [phone]

## 2021-11-04 ENCOUNTER — Ambulatory Visit (INDEPENDENT_AMBULATORY_CARE_PROVIDER_SITE_OTHER): Payer: Medicare Other | Admitting: Pharmacist Clinician (PhC)/ Clinical Pharmacy Specialist

## 2021-11-04 ENCOUNTER — Other Ambulatory Visit: Payer: Self-pay

## 2021-11-04 DIAGNOSIS — Z951 Presence of aortocoronary bypass graft: Secondary | ICD-10-CM | POA: Insufficient documentation

## 2021-11-04 DIAGNOSIS — I10 Essential (primary) hypertension: Secondary | ICD-10-CM | POA: Diagnosis not present

## 2021-11-04 DIAGNOSIS — G629 Polyneuropathy, unspecified: Secondary | ICD-10-CM | POA: Insufficient documentation

## 2021-11-04 DIAGNOSIS — E1142 Type 2 diabetes mellitus with diabetic polyneuropathy: Secondary | ICD-10-CM | POA: Insufficient documentation

## 2021-11-04 DIAGNOSIS — N3010 Interstitial cystitis (chronic) without hematuria: Secondary | ICD-10-CM | POA: Insufficient documentation

## 2021-11-04 DIAGNOSIS — Z79899 Other long term (current) drug therapy: Secondary | ICD-10-CM | POA: Insufficient documentation

## 2021-11-04 DIAGNOSIS — L309 Dermatitis, unspecified: Secondary | ICD-10-CM | POA: Insufficient documentation

## 2021-11-04 DIAGNOSIS — E559 Vitamin D deficiency, unspecified: Secondary | ICD-10-CM | POA: Insufficient documentation

## 2021-11-04 DIAGNOSIS — F419 Anxiety disorder, unspecified: Secondary | ICD-10-CM | POA: Insufficient documentation

## 2021-11-04 DIAGNOSIS — M069 Rheumatoid arthritis, unspecified: Secondary | ICD-10-CM | POA: Insufficient documentation

## 2021-11-04 DIAGNOSIS — Z87440 Personal history of urinary (tract) infections: Secondary | ICD-10-CM | POA: Insufficient documentation

## 2021-11-04 DIAGNOSIS — R209 Unspecified disturbances of skin sensation: Secondary | ICD-10-CM | POA: Insufficient documentation

## 2021-11-04 DIAGNOSIS — E1169 Type 2 diabetes mellitus with other specified complication: Secondary | ICD-10-CM | POA: Insufficient documentation

## 2021-11-04 DIAGNOSIS — K279 Peptic ulcer, site unspecified, unspecified as acute or chronic, without hemorrhage or perforation: Secondary | ICD-10-CM | POA: Insufficient documentation

## 2021-11-04 DIAGNOSIS — M8700 Idiopathic aseptic necrosis of unspecified bone: Secondary | ICD-10-CM | POA: Insufficient documentation

## 2021-11-04 DIAGNOSIS — K219 Gastro-esophageal reflux disease without esophagitis: Secondary | ICD-10-CM | POA: Insufficient documentation

## 2021-11-04 DIAGNOSIS — I739 Peripheral vascular disease, unspecified: Secondary | ICD-10-CM | POA: Insufficient documentation

## 2021-11-04 MED ORDER — HYDRALAZINE HCL 25 MG PO TABS: 25.0000 mg | ORAL_TABLET | Freq: Two times a day (BID) | ORAL | 3 refills | Status: DC

## 2021-11-04 MED ORDER — HYDRALAZINE HCL 25 MG PO TABS: 25.0000 mg | ORAL_TABLET | Freq: Three times a day (TID) | ORAL | 3 refills | Status: DC

## 2021-11-04 NOTE — Assessment & Plan Note (Signed)
Patient with essential hypertension, currently not at goal on amlodipine and carvedilol.  Need to d/c amlodipine d/t edema.  Cannot add ACE/ARB or diuretic, as last labs (at hosp w/dehydration) showed low sodium and high potassium.  Would like to see stabilized electrolytes before considering these meds.  For now will d/c amlodipine and start hydralazine 25 mg bid.  Asked that she continue with regular home BP monitoring and bring home readings to appointment set with Caron Presume PA on December 7.

## 2021-11-04 NOTE — Progress Notes (Signed)
11/04/2021 [name] 03/24/1933 [id number]   HPI:  [name] is a 87 y.o. female patient of Dr Gwenlyn Found, with a PMH below who presents today for hypertension clinic evaluation.  She was seen by Dr. Gwenlyn Found in September, at which time her pressure was noted to be 185/84.  He started her on amlodipine 5 mg daily in addition to her carvedilol.  Since then she was in the ED on two separate occasions with syncopal episodes.  Both were believed to be due to dehydration.  She was told to discontinue her furosemide.    Today she returns for follow up.   She is concerned if the syncopal episodes were due to having TIA's.  Assured her that the ED notes in system are more consistent with dehydration, and that TIA's often present in a different manner.  She also notes that her feet have been swelling some since starting the amlodipine.     Past Medical History: ASCVD NSTEMI,, CABG (1998) chronic angina; PAD w/severely calcified pop and tibial peroneal trunk disease bilaterally; stroke  hyperlipidemia LDL 119 (4(21) on ezetimibe 10, rosuvastatin 40  CHF Chronic diastolic  DM2 K7Q 7.3 (25/95) on glimepiride 4 mg  RA On sulfasalazine, prednisone 5 mg, methotrexate      Blood Pressure Goal:  130/80  Current Medications: carvedilol 6.25 bid, amlodipine 5 mg (edema); isosorbide mono   Family Hx: no history, only child; 1 daughter with hypertension (treatied with lifestyle)  Social Hx: no tobacco, no alcohol, cocoa most days in cold weather, occasional coffee  Diet: mostly home cooked does not add salt. Occasional fried foods (bacon); lots of vegetables, occasional canned, but mostly fresh; lima beans, greens  Exercise: PT since hip surgery, about a year ago, had some issues so back in therapy  Home BP readings: home cuff, highest  [id number]/ 43-32'R diastolic  Intolerances: amlodipine - edema; shellfish/seafood - anaphylaxis, latex - anaphylaxis  Labs:  10/22:  Na 132, K 5, Glu 205, BUN 15, SCr  1.15   Wt Readings from Last 3 Encounters:  11/04/21 153 lb 9.6 oz (69.7 kg)  09/23/21 149 lb (67.6 kg)  08/25/21 156 lb 15.8 oz (71.2 kg)   BP Readings from Last 3 Encounters:  11/04/21 132/80  09/23/21 (!) 152/65  08/25/21 (!) 182/75   Pulse Readings from Last 3 Encounters:  11/04/21 73  09/23/21 (!) 58  08/25/21 (!) 50    Current Outpatient Medications  Medication Sig Dispense Refill   acetaminophen (TYLENOL) 650 MG CR tablet Take 650 mg by mouth every 8 (eight) hours as needed for pain.     acetaminophen-codeine (TYLENOL #3) 300-30 MG tablet Take 1-2 tablets by mouth every 8 (eight) hours as needed. 30 tablet 0   carvedilol (COREG) 6.25 MG tablet TAKE 1 TABLET BY MOUTH TWICE A DAY 180 tablet 0   Cholecalciferol (VITAMIN D3) 10 MCG (400 UNIT) tablet Take 400 Units by mouth daily.     clopidogrel (PLAVIX) 75 MG tablet Take 75 mg by mouth daily.     Coenzyme Q10 (CO Q 10 PO) Take 1 tablet by mouth every other day.      ezetimibe (ZETIA) 10 MG tablet Take 10 mg by mouth at bedtime.     folic acid (FOLVITE) 1 MG tablet Take 1 mg by mouth daily.  1   gabapentin (NEURONTIN) 400 MG capsule Take 400 mg by mouth 3 (three) times daily.     glimepiride (AMARYL) 4 MG tablet Take  4 mg by mouth daily with breakfast.     hydrALAZINE (APRESOLINE) 25 MG tablet Take 1 tablet (25 mg total) by mouth in the morning and at bedtime. 60 tablet 3   isosorbide mononitrate (IMDUR) 60 MG 24 hr tablet TAKE 1.5 TABLETS (90 MG TOTAL) BY MOUTH DAILY. . (Patient taking differently: Take 60 mg by mouth daily. Marland Kitchen) 135 tablet 3   methotrexate (RHEUMATREX) 2.5 MG tablet Take 20 mg by mouth every Friday. (8 tablets)  1   MULTIPLE VITAMIN-FOLIC ACID PO      nitroGLYCERIN (NITROSTAT) 0.4 MG SL tablet PLACE 1 TABLET (0.4 MG TOTAL) UNDER THE TONGUE EVERY 5 (FIVE) MINUTES AS NEEDED FOR CHEST PAIN. 25 tablet 6   pantoprazole (PROTONIX) 40 MG tablet Take 1 tablet (40 mg total) by mouth 2 (two) times daily before a meal. 60  tablet 1   predniSONE (DELTASONE) 5 MG tablet Take 5 mg by mouth daily with breakfast.      rosuvastatin (CRESTOR) 40 MG tablet TAKE 1 TABLET BY MOUTH EVERYDAY AT BEDTIME 90 tablet 2   sulfaSALAzine (AZULFIDINE) 500 MG EC tablet Take 500 mg by mouth 2 (two) times daily.     No current facility-administered medications for this visit.    Allergies  Allergen Reactions   Fish Allergy Hives and Shortness Of Breath   Iodinated Diagnostic Agents Shortness Of Breath and Anaphylaxis    Other reaction(s): Unknown   Latex Anaphylaxis, Hives, Shortness Of Breath, Itching and Other (See Comments)    REACTION: wheezing Other reaction(s): Unknown   Shellfish Allergy Anaphylaxis, Hives, Shortness Of Breath and Other (See Comments)    All seafood   Amlodipine Swelling   Evolocumab     Other reaction(s): latex on syringe   Other Itching and Other (See Comments)    Patient reports allergy to perfumed detergents. Causes itching.   Metformin Nausea Only and Nausea And Vomiting    Other reaction(s): Unknown    Past Medical History:  Diagnosis Date   Allergy to IVP dye    Angina, class I ([place]) 04/22/2015   Atherosclerotic heart disease native coronary artery w/angina pectoris ([place]) 1992   a. 1992 s/p PTCA of LAD;  b. 1998 CABG x 3; c. 07/2001 Cath: Sev LM/LAD/LCX dzs, 3/3 patent grafts; d. 11/2013 Cath: stable graft anatomy; e. 10/2016 Cath: native 3VD, VG->OM nl, LIMA->LAD nl, VG->Diag 55.   Atherosclerotic heart disease of native coronary artery with angina pectoris ([place]) 01/14/2010   Qualifier: Diagnosis of  By: Deatra Ina MD, Sandy Salaam    Atrial septal aneurysm    Barrett's esophagus 03/04/2010   Qualifier: Diagnosis of  By: Shane Crutch, Amy S    Bilateral leg edema 04/22/2015   Bradycardia, mild briefly to the 40s, asymptomatic 05/16/2013   Carotid arterial disease ([place])    a. 05/2014 Carotid U/S: No signif bilat ICA stenosis, >60% L ECA.   Chronic anemia    Chronic diastolic CHF (congestive  heart failure) ([place])    a. 10/2016 Echo: Ef 55-60%, no rwma, Gr1 DD, triv AI, PASP 47mmHg, atrial septal aneurysm.   Chronic diastolic CHF (congestive heart failure), NYHA class 2 ([place]) 08/11/2017   Claudication ([place]) 05/16/2013   Peripheral arterial occlusive disease with lifestyle limiting claudication    Degenerative joint disease 05/30/2018   DIVERTICULOSIS OF COLON 01/14/2010   Qualifier: Diagnosis of  By: Deatra Ina MD, Sandy Salaam    DM (diabetes mellitus), type 2 with peripheral vascular complications ([place])    On Oral medications.  DOE (dyspnea on exertion) 04/22/2015   Essential hypertension    FECAL INCONTINENCE 03/04/2010   Qualifier: Diagnosis of  By: Trellis Paganini PA-c, Amy S    GERD (gastroesophageal reflux disease)    Hyperlipidemia with target LDL less than 70    Inflammatory arthritis 10/13/2017   Neuropathy    Non-insulin dependent type 2 diabetes mellitus ([place]) 10/24/2015   NSTEMI (non-ST elevated myocardial infarction) ([place]) 11/01/2016   PAD (peripheral artery disease) ([place]) 05/2013; 09/2013   a. severe calcified POP-1 & TP Trunk Dz bilat;  b . 05/2013 Diamondback Rot Athrectomy (R POP) --> PTA R Pop, DES to R Peroneal;  c. 09/2013 PTA/Stenting of L Pop [name] - retrograde access);  d. 04/2014 ABI's: R/L: 1.1/1.1.   Preoperative cardiovascular examination 04/15/2020   PUD (peptic ulcer disease)    Rheumatoid arthritis ([place])    S/P CABG x 3 1998   LIMA-LAD, SVG-OM, SVG-D1   Severe claudication ([place]) 05/2013   Referred for Peripheral Angio (Dr. Gwenlyn Found --> Dr. Brunetta Jeans in [place])   [place] 01/14/2010   Qualifier: Diagnosis of  By: Harlon Ditty CMA ([place]), [name]     Status post total replacement of right hip 10/12/2018   Vertigo 01/02/2014    Blood pressure 132/80, pulse 73, resp. rate 16, height 5\' 3"  (1.6 m), weight 153 lb 9.6 oz (69.7 kg), SpO2 100 %.  Essential hypertension Patient with essential hypertension, currently not at goal on amlodipine and  carvedilol.  Need to d/c amlodipine d/t edema.  Cannot add ACE/ARB or diuretic, as last labs (at hosp w/dehydration) showed low sodium and high potassium.  Would like to see stabilized electrolytes before considering these meds.  For now will d/c amlodipine and start hydralazine 25 mg bid.  Asked that she continue with regular home BP monitoring and bring home readings to appointment set with Caron Presume PA on December 7.     Tommy Medal PharmD CPP [facility] [address] [place], [place] [address] [phone]

## 2021-11-04 NOTE — Progress Notes (Signed)
11/04/2021 [name] 25-Sep-1933 [id number]   HPI:  [name] is a 87 y.o. female patient of Dr Gwenlyn Found, with a PMH below who presents today for hypertension clinic evaluation.  She was seen by Dr. Gwenlyn Found in September, at which time her pressure was noted to be 185/84  Took lasix but cause syncopal spell so d/c Amlodipine causing ankle edema   Past Medical History:                   Blood Pressure Goal:  130/80  Current Medications:  Family Hx:  Social Hx:   Diet:   Exercise:   Home BP readings:   Intolerances:   Labs:    Wt Readings from Last 3 Encounters:  11/04/21 153 lb 9.6 oz (69.7 kg)  09/23/21 149 lb (67.6 kg)  08/25/21 156 lb 15.8 oz (71.2 kg)   BP Readings from Last 3 Encounters:  09/23/21 (!) 152/65  08/25/21 (!) 182/75  08/14/21 (!) 185/84   Pulse Readings from Last 3 Encounters:  09/23/21 (!) 58  08/25/21 (!) 50  08/14/21 71    Current Outpatient Medications  Medication Sig Dispense Refill   acetaminophen (TYLENOL) 650 MG CR tablet Take 650 mg by mouth every 8 (eight) hours as needed for pain.     acetaminophen-codeine (TYLENOL #3) 300-30 MG tablet Take 1-2 tablets by mouth every 8 (eight) hours as needed. 30 tablet 0   amLODipine (NORVASC) 5 MG tablet Take 1 tablet (5 mg total) by mouth daily. 90 tablet 3   carvedilol (COREG) 6.25 MG tablet TAKE 1 TABLET BY MOUTH TWICE A DAY 180 tablet 0   Cholecalciferol (VITAMIN D3) 10 MCG (400 UNIT) tablet Take 400 Units by mouth daily.     clopidogrel (PLAVIX) 75 MG tablet Take 75 mg by mouth daily.     Coenzyme Q10 (CO Q 10 PO) Take 1 tablet by mouth every other day.      ezetimibe (ZETIA) 10 MG tablet Take 10 mg by mouth at bedtime.     folic acid (FOLVITE) 1 MG tablet Take 1 mg by mouth daily.  1   gabapentin (NEURONTIN) 400 MG capsule Take 400 mg by mouth 3 (three) times daily.     isosorbide mononitrate (IMDUR) 60 MG 24 hr tablet TAKE 1.5 TABLETS (90 MG TOTAL) BY MOUTH DAILY. . (Patient  taking differently: Take 60 mg by mouth daily. Marland Kitchen) 135 tablet 3   methotrexate (RHEUMATREX) 2.5 MG tablet Take 20 mg by mouth every Friday. (8 tablets)  1   MULTIPLE VITAMIN-FOLIC ACID PO      nitroGLYCERIN (NITROSTAT) 0.4 MG SL tablet PLACE 1 TABLET (0.4 MG TOTAL) UNDER THE TONGUE EVERY 5 (FIVE) MINUTES AS NEEDED FOR CHEST PAIN. 25 tablet 6   pantoprazole (PROTONIX) 40 MG tablet Take 1 tablet (40 mg total) by mouth 2 (two) times daily before a meal. 60 tablet 1   predniSONE (DELTASONE) 5 MG tablet Take 5 mg by mouth daily with breakfast.      rosuvastatin (CRESTOR) 40 MG tablet TAKE 1 TABLET BY MOUTH EVERYDAY AT BEDTIME 90 tablet 2   sulfaSALAzine (AZULFIDINE) 500 MG EC tablet Take 500 mg by mouth 2 (two) times daily.     No current facility-administered medications for this visit.    Allergies  Allergen Reactions   Fish Allergy Hives and Shortness Of Breath   Iodinated Diagnostic Agents Shortness Of Breath and Anaphylaxis    Other reaction(s): Unknown  Latex Anaphylaxis, Hives, Shortness Of Breath, Itching and Other (See Comments)    REACTION: wheezing Other reaction(s): Unknown   Shellfish Allergy Anaphylaxis, Hives, Shortness Of Breath and Other (See Comments)    All seafood   Evolocumab     Other reaction(s): latex on syringe   Other Itching and Other (See Comments)    Patient reports allergy to perfumed detergents. Causes itching.   Metformin Nausea Only and Nausea And Vomiting    Other reaction(s): Unknown    Past Medical History:  Diagnosis Date   Allergy to IVP dye    Angina, class I ([place]) 04/22/2015   Atherosclerotic heart disease native coronary artery w/angina pectoris ([place]) 1992   a. 1992 s/p PTCA of LAD;  b. 1998 CABG x 3; c. 07/2001 Cath: Sev LM/LAD/LCX dzs, 3/3 patent grafts; d. 11/2013 Cath: stable graft anatomy; e. 10/2016 Cath: native 3VD, VG->OM nl, LIMA->LAD nl, VG->Diag 55.   Atherosclerotic heart disease of native coronary artery with angina pectoris ([place])  01/14/2010   Qualifier: Diagnosis of  By: Deatra Ina MD, Sandy Salaam    Atrial septal aneurysm    Barrett's esophagus 03/04/2010   Qualifier: Diagnosis of  By: Shane Crutch, Amy S    Bilateral leg edema 04/22/2015   Bradycardia, mild briefly to the 40s, asymptomatic 05/16/2013   Carotid arterial disease ([place])    a. 05/2014 Carotid U/S: No signif bilat ICA stenosis, >60% L ECA.   Chronic anemia    Chronic diastolic CHF (congestive heart failure) ([place])    a. 10/2016 Echo: Ef 55-60%, no rwma, Gr1 DD, triv AI, PASP 26mmHg, atrial septal aneurysm.   Chronic diastolic CHF (congestive heart failure), NYHA class 2 ([place]) 08/11/2017   Claudication ([place]) 05/16/2013   Peripheral arterial occlusive disease with lifestyle limiting claudication    Degenerative joint disease 05/30/2018   DIVERTICULOSIS OF COLON 01/14/2010   Qualifier: Diagnosis of  By: Deatra Ina MD, Sandy Salaam    DM (diabetes mellitus), type 2 with peripheral vascular complications ([place])    On Oral medications.   DOE (dyspnea on exertion) 04/22/2015   Essential hypertension    FECAL INCONTINENCE 03/04/2010   Qualifier: Diagnosis of  By: Trellis Paganini PA-c, Amy S    GERD (gastroesophageal reflux disease)    Hyperlipidemia with target LDL less than 70    Inflammatory arthritis 10/13/2017   Neuropathy    Non-insulin dependent type 2 diabetes mellitus ([place]) 10/24/2015   NSTEMI (non-ST elevated myocardial infarction) ([place]) 11/01/2016   PAD (peripheral artery disease) ([place]) 05/2013; 09/2013   a. severe calcified POP-1 & TP Trunk Dz bilat;  b . 05/2013 Diamondback Rot Athrectomy (R POP) --> PTA R Pop, DES to R Peroneal;  c. 09/2013 PTA/Stenting of L Pop [name] - retrograde access);  d. 04/2014 ABI's: R/L: 1.1/1.1.   Preoperative cardiovascular examination 04/15/2020   PUD (peptic ulcer disease)    Rheumatoid arthritis ([place])    S/P CABG x 3 1998   LIMA-LAD, SVG-OM, SVG-D1   Severe claudication ([place]) 05/2013   Referred for Peripheral Angio (Dr. Gwenlyn Found  --> Dr. Brunetta Jeans in [place])   [place] 01/14/2010   Qualifier: Diagnosis of  By: Harlon Ditty CMA ([place]), [name]     Status post total replacement of right hip 10/12/2018   Vertigo 01/02/2014    Resp. rate 16, height 5\' 3"  (1.6 m), weight 153 lb 9.6 oz (69.7 kg).  No problem-specific Assessment & Plan notes found for this encounter.   Tommy Medal PharmD CPP [facility] [address] [place], [place] [address] [phone]

## 2021-11-04 NOTE — Patient Instructions (Signed)
Return for a a follow up appointment December 7 with Caron Presume  Check your blood pressure at home daily and keep record of the readings.  Take your BP meds as follows:  Stop amlodipine   Start hydralazine 25 mg twice daily  Continue with all other medications.  Bring all of your meds, your BP cuff and your record of home blood pressures to your next appointment.  Exercise as you're able, try to walk approximately 30 minutes per day.  Keep salt intake to a minimum, especially watch canned and prepared boxed foods.  Eat more fresh fruits and vegetables and fewer canned items.  Avoid eating in fast food restaurants.    HOW TO TAKE YOUR BLOOD PRESSURE: Rest 5 minutes before taking your blood pressure.  Don't smoke or drink caffeinated beverages for at least 30 minutes before. Take your blood pressure before (not after) you eat. Sit comfortably with your back supported and both feet on the floor (don't cross your legs). Elevate your arm to heart level on a table or a desk. Use the proper sized cuff. It should fit smoothly and snugly around your bare upper arm. There should be enough room to slip a fingertip under the cuff. The bottom edge of the cuff should be 1 inch above the crease of the elbow. Ideally, take 3 measurements at one sitting and record the average.

## 2021-11-07 IMAGING — CR DG CHEST 2V
2 series · 2 of 2 positions shown · non-contrast
Comparison: 11/17/2019

CLINICAL DATA: Chest pain

EXAM:
CHEST - 2 VIEW

[chest lat]
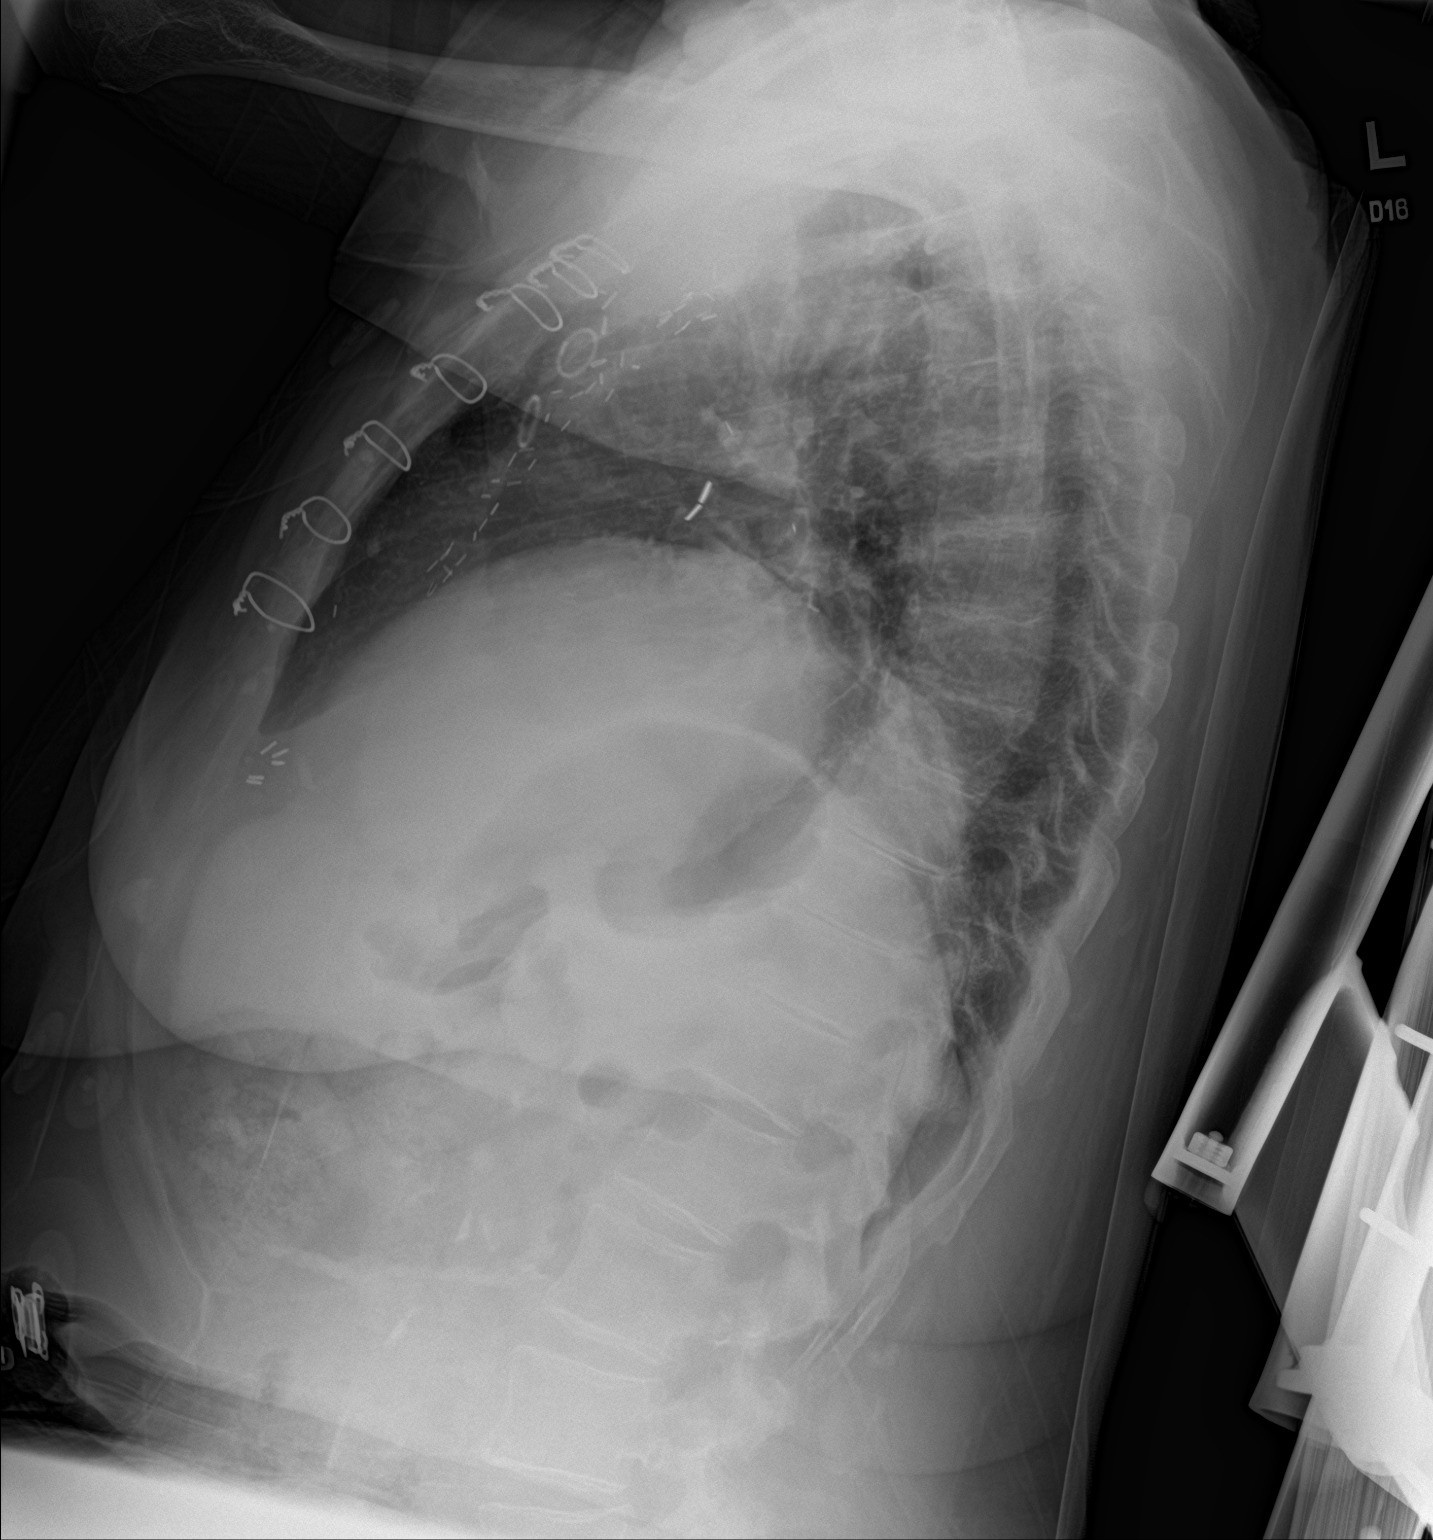

[chest ap]
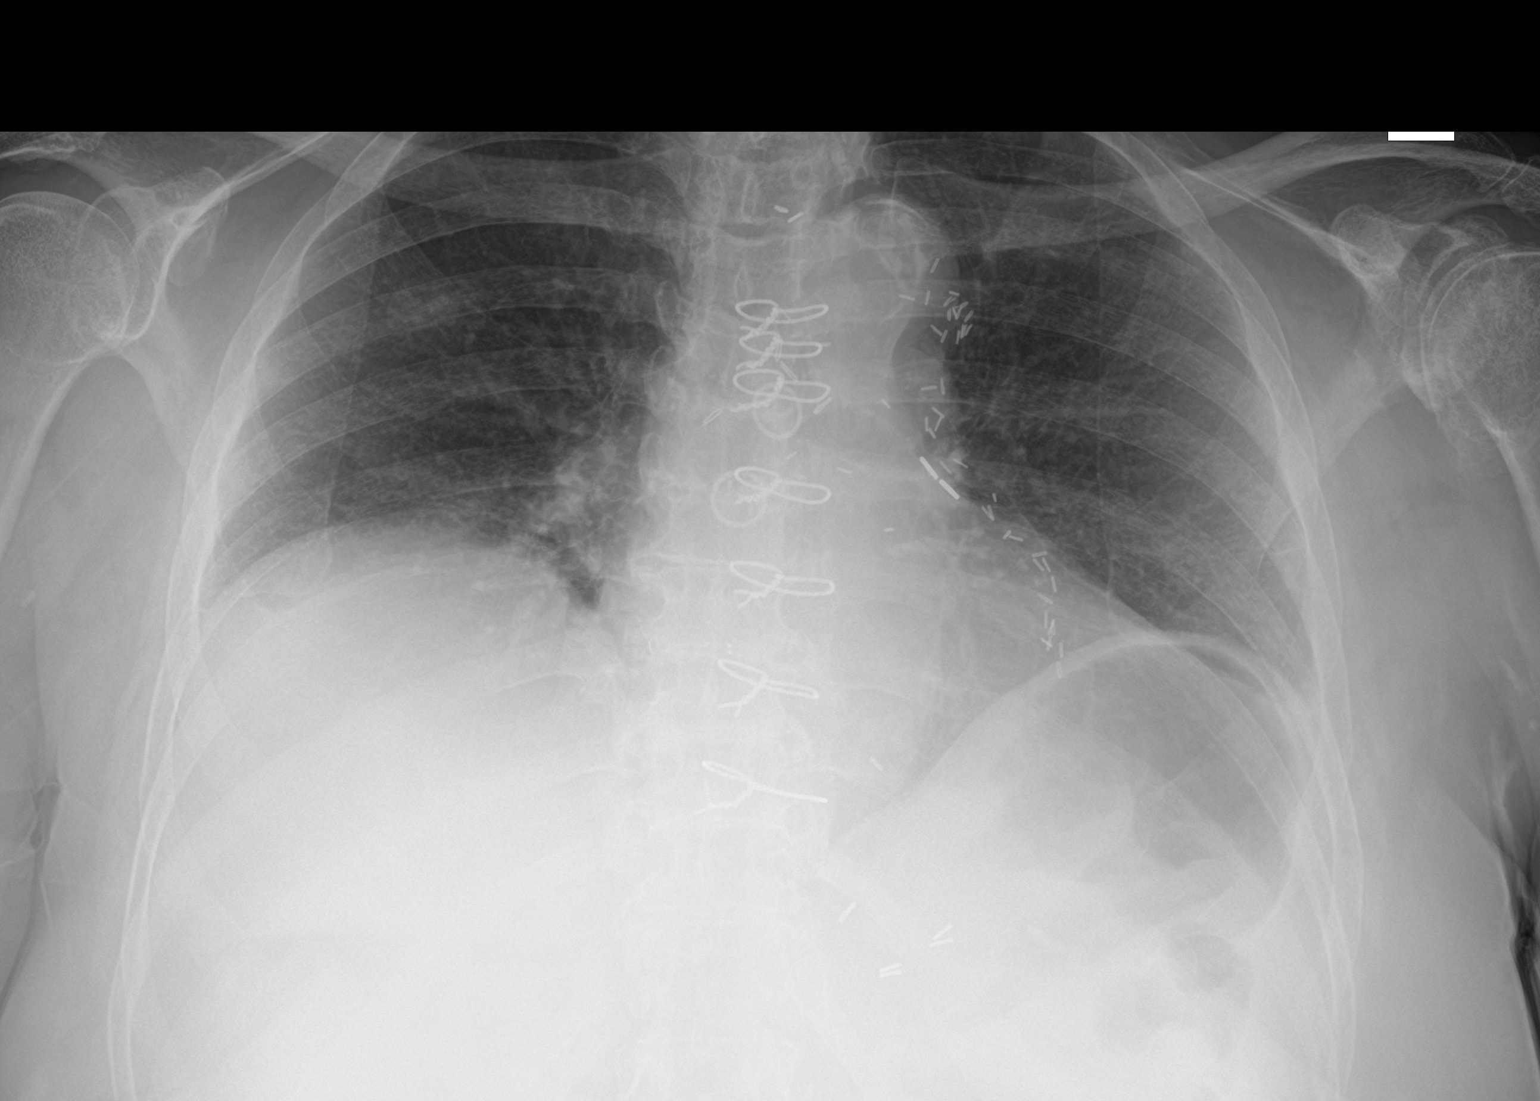

[2 of 2 positions shown; findings below may reference images not displayed]

FINDINGS: Hypoventilation with decreased lung volume. Right lower lobe
atelectasis and elevated right hemidiaphragm unchanged.

Postop CABG.  Negative for heart failure or effusion.
IMPRESSION: Hypoventilation with decreased lung volume and right lower lobe
atelectasis. No acute abnormality

## 2021-11-07 IMAGING — MR MR MRA CHEST W/ OR W/O CM
17 series · 17 of 17 positions shown · IV contrast (Contrast agent)
Comparison: CT of the chest without contrast on 11/17/2019

CLINICAL DATA: Chest pain. History of coronary artery disease.
Allergy to iodinated contrast material.

EXAM:
MRA CHEST WITH OR WITHOUT CONTRAST
TECHNIQUE: Angiographic images of the chest were obtained using MRA technique
with intravenous contrast.
CONTRAST:  6mL GADAVIST GADOBUTROL 1 MMOL/ML IV SOLN

[Series 2: t2_trufi_tra_p2_bh · axial · 8.0mm · 0.62mm/px · 1 of 24 slices shown]
[im 1/24]
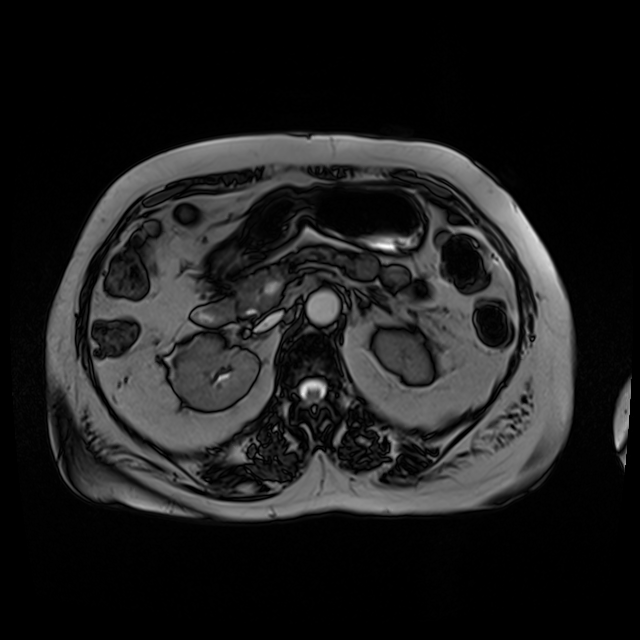

[Series 3: axial_db_haste_loc · axial · 8.0mm · 1.56mm/px · 1 of 24 slices shown]
[im 1/24]
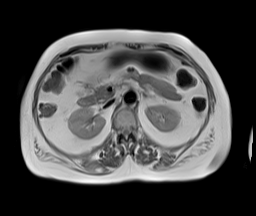

[Series 4: (person_name)_(person_name)_(person_name) · sagittal · 8.0mm · 1.79mm/px · 1 of 25 slices shown (1 of 6)]
[im 1/25]
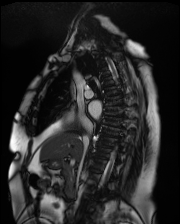

[Series 4: (person_name)_(person_name)_(person_name) · sagittal · 8.0mm · 1.79mm/px · 1 of 25 slices shown (2 of 6)]
[im 1/25]
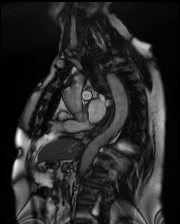

[Series 4: (person_name)_(person_name)_(person_name) · sagittal · 8.0mm · 1.79mm/px · 1 of 25 slices shown (3 of 6)]
[im 1/25]
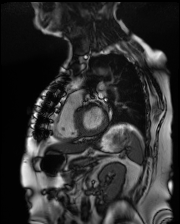

[Series 4: (person_name)_(person_name)_(person_name) · sagittal · 8.0mm · 1.79mm/px · 1 of 25 slices shown (4 of 6)]
[im 1/25]
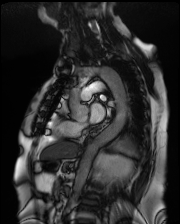

[Series 4: (person_name)_(person_name)_(person_name) · sagittal · 8.0mm · 1.79mm/px · 1 of 25 slices shown (5 of 6)]
[im 1/25]
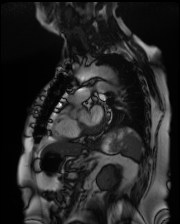

[Series 4: (person_name)_(person_name)_(person_name) · sagittal · 8.0mm · 1.79mm/px · 1 of 25 slices shown (6 of 6)]
[im 1/25]
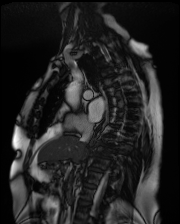

[Series 5: T1 dynamic · axial · non-contrast · 3.3mm · 1.18mm/px · 1 of 80 slices shown]
[im 1/80]
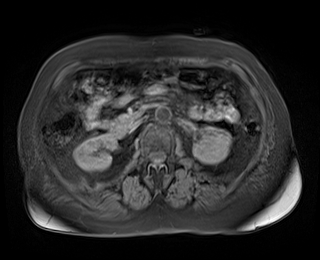

[Series 6: angio_fl3d_sag_pre · sagittal · 1.1mm · 1.17mm/px · 1 of 112 slices shown]
[im 1/112]
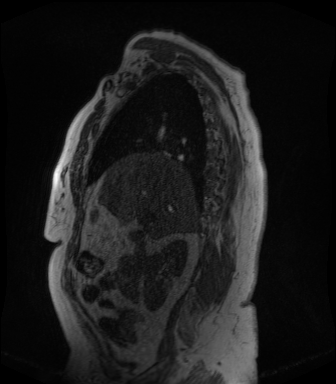

[Series 8: candy cane ce-arterial · sagittal · arterial · 1.1mm · 1.17mm/px · 1 of 112 slices shown]
[im 1/112]
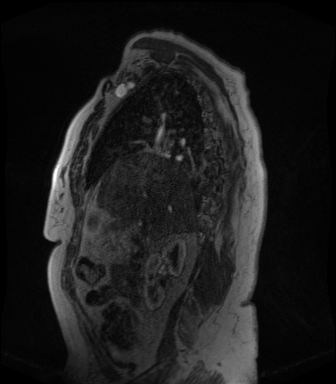

[Series 9: candy cane ce-arterial_sub · sagittal · 1.1mm · 1.17mm/px · 1 of 112 slices shown]
[im 1/112]
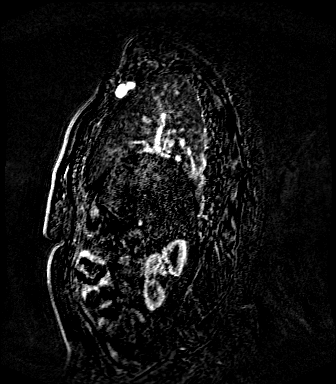

[Series 11: candy cane ce-venous · sagittal · portal-venous · 1.1mm · 1.17mm/px · 1 of 112 slices shown]
[im 1/112]
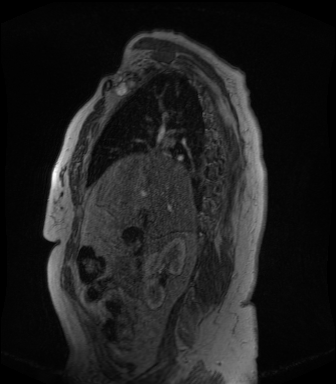

[Series 12: candy cane ce-venous_sub · sagittal · 1.1mm · 1.17mm/px · 1 of 112 slices shown]
[im 1/112]
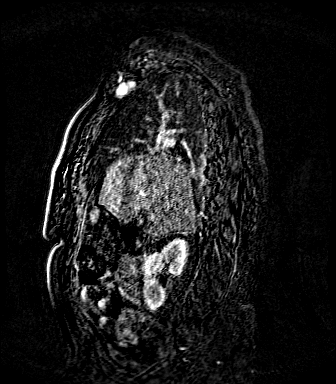

[Series 14: T1 dynamic post-contrast · axial · 3.3mm · 1.18mm/px · 1 of 80 slices shown]
[im 1/80]
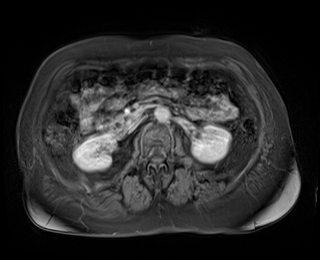

[Series 1019: mip rotate · sagittal · 1.1mm · 0.34mm/px · 1 of 14 slices shown]
[im 1/14]
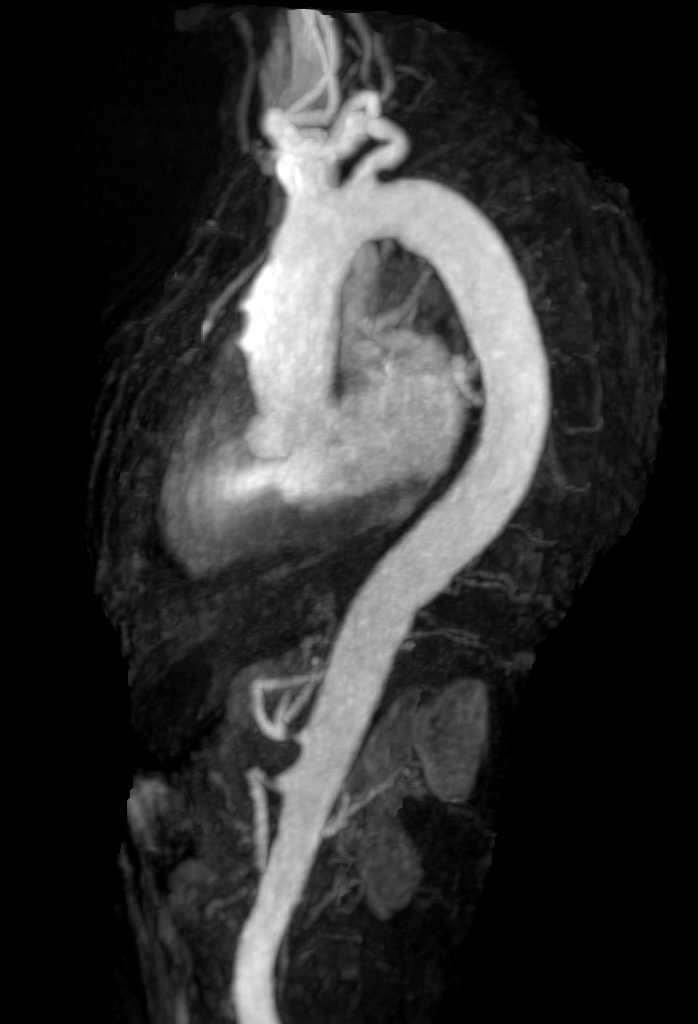

[Series 1025: mip tumble · axial · 1.1mm · 0.35mm/px · 1 of 4 slices shown]
[im 1/4]
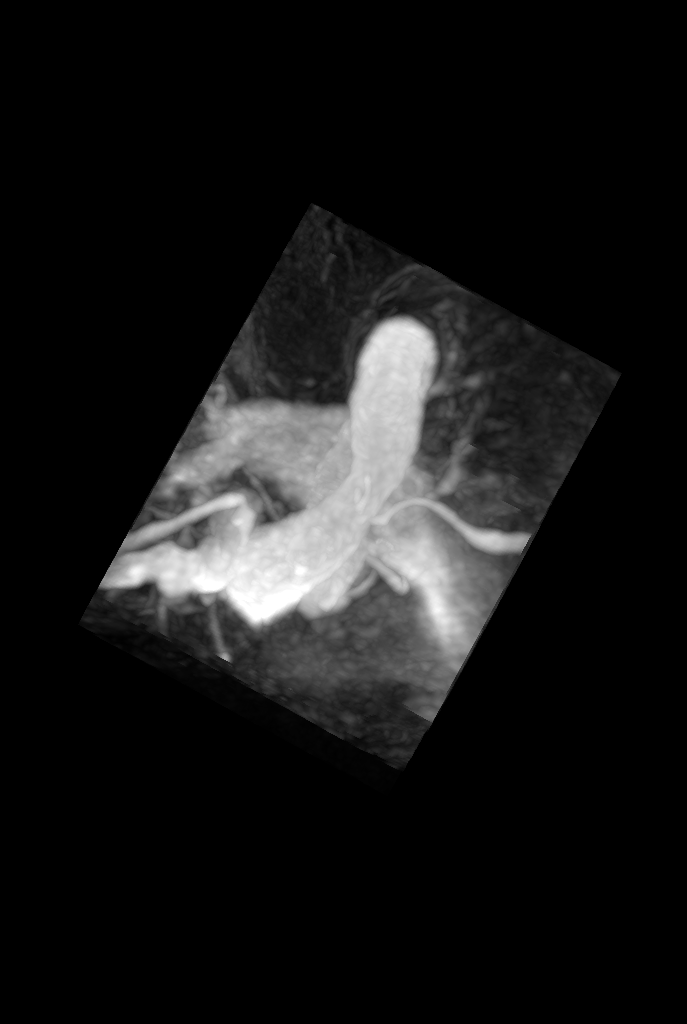

[17 of 17 positions shown; findings below may reference images not displayed]

FINDINGS: VASCULAR

Aorta: The thoracic aorta is of normal caliber. There is no evidence
of aneurysmal disease, dissection or intramural hemorrhage.
Visualized proximal great vessels demonstrate normal patency. The
left subclavian artery is moderately tortuous.

Heart: Overall heart size appears normal. The left ventricular
apical myocardium appears thin and pointed in appearance which may
relate to prior myocardial infarction. No pericardial fluid is
identified.

Pulmonary Arteries: Central pulmonary arteries are normal in
caliber.

NON-VASCULAR

Visualized mediastinum and hilar regions demonstrate no
lymphadenopathy or masses. Visualized lung fields show no masses,
consolidation or pleural fluid. Visualized upper abdominal
structures appear unremarkable. No visualized bony abnormalities.
IMPRESSION: 1. No evidence of thoracic aortic aneurysm, dissection or intramural
hemorrhage.
2. The left ventricular apical myocardium appears thin and pointed
in appearance which may relate to prior myocardial infarction.

## 2021-11-17 NOTE — Progress Notes (Deleted)
Cardiology Office Note:    Date:  11/17/2021   ID:  [name], DOB 1933-11-24, MRN [id number]  PCP:  Janie Morning, [place] Cardiologist: [name], MD   Reason for visit: 3 months   History of Present Illness:    [name] is a 87 y.o. female with a hx of hypertension, syncopal episodes thought secondary to dehydration, CAD s/p CABG 1998, PAD s/p diamondback orbital rotational atherectomy, PTA of her distal right SFA and stenting using coronary drug-eluting stent for peroneal artery which was her only patent artery on the right. She does not notice any improvement and claudication since her procedure although she says her right leg his less "heavy" and that her left leg is much more symptomatic.  She also has chronic lower extremity edema left greater than right on low-dose furosemide which we are going to increase from 20 to 40 mg a day.  She takes occasional sublingual nitroglycerin every other week for chest pain.  She did have a nonischemic Myoview for preoperative clearance before her right total hip replacement 09/15/2018.   She was admitted to the hospital on 03/18/2020 with chest pain.  She ruled out for myocardial infarction.  A Myoview stress test was read as low risk with mild inferior ischemia.  She also had lower extremity arterial Doppler studies performed in our office 03/31/2020 which revealed a right ABI of 0.39 with an occluded right popliteal artery and a left of 1.12.  No lifestyle limiting claudication on the right.  She last saw Dr. Gwenlyn Found in September 2022 and was started amlodipine for high blood pressure.   She saw Nehemiah Massed on November 04, 2021 for hypertension.  Amlodipine discontinued secondary to edema.  Patient was started on hydralazine. (Patient with essential hypertension, currently not at goal on amlodipine and carvedilol.  Need to d/c amlodipine d/t edema.  Cannot add ACE/ARB or diuretic, as last labs (at hosp w/dehydration) showed  low sodium and high potassium.  Would like to see stabilized electrolytes before considering these meds.  For now will d/c amlodipine and start hydralazine 25 mg bid.  Asked that she continue with regular home BP monitoring and bring home readings to appointment set with Caron Presume PA on December 7. )  (Bring all of your meds, your BP cuff and your record of home blood pressures to your next appointment.  Exercise as you're able, try to walk approximately 30 minutes per day.  Keep salt intake to a minimum, especially watch canned and prepared boxed foods.  Eat more fresh fruits and vegetables and fewer canned items.  Avoid eating in fast food restaurants. )  Today, ***  Hypertension -*** -Goal BP is <130/80.  Recommend DASH diet (high in vegetables, fruits, low-fat dairy products, whole grains, poultry, fish, and nuts and low in sweets, sugar-sweetened beverages, and red meats), salt restriction and increase physical activity.  Atherosclerotic heart disease of native coronary artery with angina pectoris ([place]) -CAD status post coronary artery bypass grafting 1998.  She had a Myoview stress test performed 03/18/2020 and suggested subtle inferior ischemia.  Myoview performed in 2019 was entirely normal.  She did have recent chest pain lasting off-and-on for several days but she says she is under a lot of stress for personal reasons (son-in-law on life support)..  She has had no recurrent symptoms.   PAD,severe calcified pop and tibial peroneal trunk disease bilateraly History of peripheral arterial disease status post multiple peripheral interventions dating back to 2014.  She has had orbital atherectomy and angioplasty of the distal right SFA and stenting of her peroneal artery.  She had intervention by Dr. Andree Elk 10/01/2013 of her left popliteal artery and tibioperoneal trunk. She does complain of pain in both of her legs.  Her most recent lower extremity arterial Doppler studies performed 03/31/2020  revealed a right ABI of 0.39 and a left of 1.12.  She has an occluded right popliteal artery and a widely patent left.     Chronic diastolic heart failure (HCC) ***  Hyperlipidemia -*** -Discussed cholesterol lowering diets - Mediterranean diet, DASH diet, vegetarian diet, low-carbohydrate diet and avoidance of trans fats.  Discussed healthier choice substitutes.  Nuts, high-fiber foods, and fiber supplements may also improve lipids.     Disposition - Follow-up in ***    Past Medical History:  Diagnosis Date   Allergy to IVP dye    Angina, class I ([place]) 04/22/2015   Atherosclerotic heart disease native coronary artery w/angina pectoris ([place]) 1992   a. 1992 s/p PTCA of LAD;  b. 1998 CABG x 3; c. 07/2001 Cath: Sev LM/LAD/LCX dzs, 3/3 patent grafts; d. 11/2013 Cath: stable graft anatomy; e. 10/2016 Cath: native 3VD, VG->OM nl, LIMA->LAD nl, VG->Diag 55.   Atherosclerotic heart disease of native coronary artery with angina pectoris ([place]) 01/14/2010   Qualifier: Diagnosis of  By: Deatra Ina MD, Sandy Salaam    Atrial septal aneurysm    Barrett's esophagus 03/04/2010   Qualifier: Diagnosis of  By: Shane Crutch, Amy S    Bilateral leg edema 04/22/2015   Bradycardia, mild briefly to the 40s, asymptomatic 05/16/2013   Carotid arterial disease ([place])    a. 05/2014 Carotid U/S: No signif bilat ICA stenosis, >60% L ECA.   Chronic anemia    Chronic diastolic CHF (congestive heart failure) ([place])    a. 10/2016 Echo: Ef 55-60%, no rwma, Gr1 DD, triv AI, PASP 51mmHg, atrial septal aneurysm.   Chronic diastolic CHF (congestive heart failure), NYHA class 2 ([place]) 08/11/2017   Claudication ([place]) 05/16/2013   Peripheral arterial occlusive disease with lifestyle limiting claudication    Degenerative joint disease 05/30/2018   DIVERTICULOSIS OF COLON 01/14/2010   Qualifier: Diagnosis of  By: Deatra Ina MD, Sandy Salaam    DM (diabetes mellitus), type 2 with peripheral vascular complications ([place])    On Oral  medications.   DOE (dyspnea on exertion) 04/22/2015   Essential hypertension    FECAL INCONTINENCE 03/04/2010   Qualifier: Diagnosis of  By: Trellis Paganini PA-c, Amy S    GERD (gastroesophageal reflux disease)    Hyperlipidemia with target LDL less than 70    Inflammatory arthritis 10/13/2017   Neuropathy    Non-insulin dependent type 2 diabetes mellitus ([place]) 10/24/2015   NSTEMI (non-ST elevated myocardial infarction) ([place]) 11/01/2016   PAD (peripheral artery disease) ([place]) 05/2013; 09/2013   a. severe calcified POP-1 & TP Trunk Dz bilat;  b . 05/2013 Diamondback Rot Athrectomy (R POP) --> PTA R Pop, DES to R Peroneal;  c. 09/2013 PTA/Stenting of L Pop [name] - retrograde access);  d. 04/2014 ABI's: R/L: 1.1/1.1.   Preoperative cardiovascular examination 04/15/2020   PUD (peptic ulcer disease)    Rheumatoid arthritis ([place])    S/P CABG x 3 1998   LIMA-LAD, SVG-OM, SVG-D1   Severe claudication ([place]) 05/2013   Referred for Peripheral Angio (Dr. Gwenlyn Found --> Dr. Brunetta Jeans in [place])   [place] 01/14/2010   Qualifier: Diagnosis of  By: Harlon Ditty CMA ([place]), [name]  Status post total replacement of right hip 10/12/2018   Vertigo 01/02/2014    Past Surgical History:  Procedure Laterality Date   ATHERECTOMY Right 05/15/2013   Procedure: ATHERECTOMY;  Surgeon: Lorretta Harp, MD;  Location: [facility] CATH LAB;  Service: Cardiovascular;  Laterality: Right;  popliteal   BACK SURGERY     CARDIAC CATHETERIZATION  07/17/01   2 v CAD with LM, circ, LAD, obtuse diag, mod stenosis of diag vein graft , mild stenosis of marg vein graft, nl EF, medical treatment   CARDIAC CATHETERIZATION  11/2013   Widely patent LIMA-LAD & SVG-OM with mild progression of proximal stenosis ~40-50% in SVG-D1; Widely patent native RCA with known severe native LCA disease   CARDIAC CATHETERIZATION N/A 11/01/2016   Procedure: Left Heart Cath and Cors/Grafts Angiography;  Surgeon: [name], MD;  Location: [place] CV LAB;  Service: Cardiovascular: Hemodynamics: High LVEDP!.  Cors/Grafts: LM 55%, o-p LAD 100% then after D1 -> LIMA-LAD patent, SVG-Diag ~55%. small RI wiht ~70% ostial. O-p Cx 80% - pCx 70% --> SVG-bifurcating OM patent. -- med Rx   CORONARY ANGIOPLASTY  1992   PCI to LAD   CORONARY ARTERY BYPASS GRAFT  1998   LIMA-LAD, SVG-diagonal (none roughly 50% stenosis), SVG-OM   LEFT HEART CATHETERIZATION WITH CORONARY ANGIOGRAM N/A 11/27/2013   Procedure: LEFT HEART CATHETERIZATION WITH CORONARY ANGIOGRAM;  Surgeon: [name], MD;  Location: [facility] CATH LAB;  Service: Cardiovascular;  Laterality: N/A;   LOW EXTREMITY DOPPLERS/ABI  10/2016   Patent right SFA, popliteal and peroneal tendons. Patent left SFA and popliteal stent. 30-50% stenosis in the right common femoral and popliteal arteries, 50-74% stenosis in left profunda femoris artery. --> 1 year follow-up.  RABI - 1.2, LABI 1.1   LOWER EXTREMITY ANGIOGRAM N/A 02/22/2013   Procedure: LOWER EXTREMITY ANGIOGRAM;  Surgeon: Lorretta Harp, MD;  Location: [facility] CATH LAB;  Service: Cardiovascular;  Laterality: N/A;   LOWER EXTREMITY ANGIOGRAM N/A 07/20/2013   Procedure: LOWER EXTREMITY ANGIOGRAM;  Surgeon: Lorretta Harp, MD;  Location: [facility] CATH LAB;  Service: Cardiovascular;  Laterality: N/A;   NM MYOVIEW LTD  04/03/2013; 5/26'/2016   Lexiscan: a)  Apical perfusion defect with no ischemia. Consider prior infarct versus breast attenuation.;; b) 5/'16: LOW RISK, Nl EF ~55-65%, No Ischemia /Infarction   NM MYOVIEW LTD  10/'19; 4/'21   a) LOW RISK.  EF 55%.  No ischemia or infarction. b) EF estimated 81%.  Suboptimal images.  May be subtle inferior ischemia-initially read by radiology as intermediate risk, however overread by cardiology to be mild low risk.Marland Kitchen   PERCUTANEOUS STENT INTERVENTION Right 05/15/2013   Procedure: PERCUTANEOUS STENT INTERVENTION;  Surgeon: Lorretta Harp, MD;  Location: [facility] CATH LAB;  Service: Cardiovascular;  Laterality:  Right;  tibioperoneal trunk   Peroneal Artery Stent  05/15/13   Diamondback rot. atherectomy of high grade segmental popliteal stenosis then Chocolate balloonPTA; then angiosculpt PTA of the prox peroneal with stent-Xpedition    pv angio  07/20/13   high-grade calcified popliteal disease with one-vessel runoff via a small anterior tibial artery that has proximal disease as well, no intervention   TOTAL HIP ARTHROPLASTY Right 10/12/2018   Procedure: RIGHT TOTAL HIP ARTHROPLASTY ANTERIOR APPROACH;  Surgeon: Mcarthur Rossetti, MD;  Location: [facility] ORS;  Service: Orthopedics;  Laterality: Right;   TOTAL HIP ARTHROPLASTY Left 11/21/2020   Procedure: LEFT TOTAL HIP ARTHROPLASTY ANTERIOR APPROACH;  Surgeon: Mcarthur Rossetti, MD;  Location: [facility] ORS;  Service:  Orthopedics;  Laterality: Left;   TRANSTHORACIC ECHOCARDIOGRAM  11/'17; 10/19   a) EF 55-60%. Gr 1 DD. Normal PAP. Aortic Sclerosis.;; b) Normal LV size and function with vigorous EF 65 to 70%.  GR 1 DD.  Severe LA dilation (suggestive of probably worsening: GR 1 DD).   TRANSTHORACIC ECHOCARDIOGRAM  03/18/2020   EF 70-75%.  Hyperdynamic.  GR 1 DD.  No R WMA.  Mild LA dilation.  Mild aortic valve sclerosis but no stenosis.-No change from prior echo   TUBAL LIGATION      Current Medications: No outpatient medications have been marked as taking for the 11/18/21 encounter (Appointment) with Warren Lacy, PA-C.     Allergies:   Fish allergy, Iodinated diagnostic agents, Latex, Shellfish allergy, Amlodipine, Evolocumab, Other, and Metformin   Social History   Socioeconomic History   Marital status: Married    Spouse name: Not on file   Number of children: Not on file   Years of education: Not on file   Highest education level: Not on file  Occupational History   Occupation: retired  Tobacco Use   Smoking status: Never   Smokeless tobacco: Never  Vaping Use   Vaping Use: Never used  Substance and Sexual Activity   Alcohol use:  Not Currently    Comment: rare glass of wine    Drug use: No   Sexual activity: Not on file  Other Topics Concern   Not on file  Social History Narrative   Right handed   Lives alone ( husband in rehab facility)   Caffeine- mostly coffee       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   Social Determinants of Health   Financial Resource Strain: Not on file  Food Insecurity: Not on file  Transportation Needs: Not on file  Physical Activity: Not on file  Stress: Not on file  Social Connections: Not on file     Family History: The patient's family history includes Hyperlipidemia in her father and mother; Hypertension in her father and mother. She was adopted.  ROS:   Please see the history of present illness.     EKGs/Labs/Other Studies Reviewed:    EKG:  The ekg ordered today demonstrates ***  Recent Labs: 11/17/2020: Magnesium 2.2 09/23/2021: ALT 24; BUN 15; Creatinine, Ser 1.15; Hemoglobin 11.5; Platelets 231; Potassium 5.0; Sodium 132   Recent Lipid Panel Lab Results  Component Value Date/Time   CHOL 177 03/19/2020 12:29 AM   CHOL 138 05/19/2017 12:54 PM   TRIG 35 03/19/2020 12:29 AM   HDL 51 03/19/2020 12:29 AM   HDL 54 05/19/2017 12:54 PM   LDLCALC 119 (H) 03/19/2020 12:29 AM   LDLCALC 73 05/19/2017 12:54 PM    Physical Exam:    VS:  There were no vitals taken for this visit.   No data found.  Wt Readings from Last 3 Encounters:  11/04/21 153 lb 9.6 oz (69.7 kg)  09/23/21 149 lb (67.6 kg)  08/25/21 156 lb 15.8 oz (71.2 kg)     GEN: *** Well nourished, well developed in no acute distress HEENT: Normal NECK: No JVD; No carotid bruits CARDIAC: ***RRR, no murmurs, rubs, gallops RESPIRATORY:  Clear to auscultation without rales, wheezing or rhonchi  ABDOMEN: Soft, non-tender, non-distended MUSCULOSKELETAL: No edema; No deformity  SKIN: Warm and dry NEUROLOGIC:  Alert and oriented PSYCHIATRIC:  Normal affect     ASSESSMENT AND PLAN   ***   {Are you ordering a CV Procedure  (e.g. stress test, cath,  DCCV, TEE, etc)?   Press F2        :[id number]}    Medication Adjustments/Labs and Tests Ordered: Current medicines are reviewed at length with the patient today.  Concerns regarding medicines are outlined above.  No orders of the defined types were placed in this encounter.  No orders of the defined types were placed in this encounter.   There are no Patient Instructions on file for this visit.   Signed, Warren Lacy, PA-C  11/17/2021 2:57 PM    [facility]

## 2021-11-18 ENCOUNTER — Ambulatory Visit: Payer: Medicare Other | Admitting: Physician Assistant

## 2021-11-18 DIAGNOSIS — I10 Essential (primary) hypertension: Secondary | ICD-10-CM

## 2021-12-22 ENCOUNTER — Ambulatory Visit: Payer: Medicare Other | Admitting: Physician Assistant

## 2022-01-07 ENCOUNTER — Encounter (HOSPITAL_COMMUNITY): Payer: Self-pay

## 2022-01-07 ENCOUNTER — Emergency Department (HOSPITAL_COMMUNITY): Payer: Medicare Other

## 2022-01-07 ENCOUNTER — Other Ambulatory Visit: Payer: Self-pay

## 2022-01-07 ENCOUNTER — Emergency Department (HOSPITAL_COMMUNITY)
Admission: EM | Admit: 2022-01-07 | Discharge: 2022-01-07 | Disposition: A | Discharge status: Home / Self Care | Payer: Medicare Other | Attending: Emergency Medicine | Admitting: Emergency Medicine

## 2022-01-07 DIAGNOSIS — Z7984 Long term (current) use of oral hypoglycemic drugs: Secondary | ICD-10-CM | POA: Insufficient documentation

## 2022-01-07 DIAGNOSIS — I509 Heart failure, unspecified: Secondary | ICD-10-CM | POA: Diagnosis not present

## 2022-01-07 DIAGNOSIS — Y92009 Unspecified place in unspecified non-institutional (private) residence as the place of occurrence of the external cause: Secondary | ICD-10-CM

## 2022-01-07 DIAGNOSIS — Z91040 Latex allergy status: Secondary | ICD-10-CM | POA: Insufficient documentation

## 2022-01-07 DIAGNOSIS — I160 Hypertensive urgency: Secondary | ICD-10-CM | POA: Diagnosis not present

## 2022-01-07 DIAGNOSIS — E1165 Type 2 diabetes mellitus with hyperglycemia: Secondary | ICD-10-CM | POA: Insufficient documentation

## 2022-01-07 DIAGNOSIS — I110 Hypertensive heart disease with heart failure: Secondary | ICD-10-CM | POA: Insufficient documentation

## 2022-01-07 DIAGNOSIS — E860 Dehydration: Secondary | ICD-10-CM | POA: Diagnosis not present

## 2022-01-07 DIAGNOSIS — R2243 Localized swelling, mass and lump, lower limb, bilateral: Secondary | ICD-10-CM | POA: Diagnosis not present

## 2022-01-07 DIAGNOSIS — R42 Dizziness and giddiness: Secondary | ICD-10-CM | POA: Diagnosis present

## 2022-01-07 DIAGNOSIS — I2510 Atherosclerotic heart disease of native coronary artery without angina pectoris: Secondary | ICD-10-CM | POA: Diagnosis not present

## 2022-01-07 DIAGNOSIS — Z7901 Long term (current) use of anticoagulants: Secondary | ICD-10-CM | POA: Insufficient documentation

## 2022-01-07 DIAGNOSIS — I10 Essential (primary) hypertension: Secondary | ICD-10-CM | POA: Insufficient documentation

## 2022-01-07 DIAGNOSIS — W19XXXA Unspecified fall, initial encounter: Secondary | ICD-10-CM

## 2022-01-07 LAB — CBC WITH DIFFERENTIAL/PLATELET
Abs Immature Granulocytes: 0.03 10*3/uL (ref 0.00–0.07)
Basophils Absolute: 0 10*3/uL (ref 0.0–0.1)
Basophils Relative: 0 %
Eosinophils Absolute: 0.2 10*3/uL (ref 0.0–0.5)
Eosinophils Relative: 2 %
HCT: 38 % (ref 36.0–46.0)
Hemoglobin: 11.4 g/dL — ABNORMAL LOW (ref 12.0–15.0)
Immature Granulocytes: 0 %
Lymphocytes Relative: 20 %
Lymphs Abs: 1.7 10*3/uL (ref 0.7–4.0)
MCH: 27.7 pg (ref 26.0–34.0)
MCHC: 30 g/dL (ref 30.0–36.0)
MCV: 92.2 fL (ref 80.0–100.0)
Monocytes Absolute: 0.7 10*3/uL (ref 0.1–1.0)
Monocytes Relative: 8 %
Neutro Abs: 5.8 10*3/uL (ref 1.7–7.7)
Neutrophils Relative %: 70 %
Platelets: 265 10*3/uL (ref 150–400)
RBC: 4.12 MIL/uL (ref 3.87–5.11)
RDW: 13.6 % (ref 11.5–15.5)
WBC: 8.4 10*3/uL (ref 4.0–10.5)
nRBC: 0 % (ref 0.0–0.2)

## 2022-01-07 LAB — COMPREHENSIVE METABOLIC PANEL
ALT: 15 U/L (ref 0–44)
AST: 19 U/L (ref 15–41)
Albumin: 3.6 g/dL (ref 3.5–5.0)
Alkaline Phosphatase: 45 U/L (ref 38–126)
Anion gap: 7 (ref 5–15)
BUN: 18 mg/dL (ref 8–23)
CO2: 29 mmol/L (ref 22–32)
Calcium: 8.8 mg/dL — ABNORMAL LOW (ref 8.9–10.3)
Chloride: 103 mmol/L (ref 98–111)
Creatinine, Ser: 0.92 mg/dL (ref 0.44–1.00)
GFR, Estimated: >60 mL/min (ref >60)
Glucose, Bld: 191 mg/dL — ABNORMAL HIGH (ref 70–99)
Potassium: 4.2 mmol/L (ref 3.5–5.1)
Sodium: 139 mmol/L (ref 135–145)
Total Bilirubin: 0.5 mg/dL (ref 0.3–1.2)
Total Protein: 7 g/dL (ref 6.5–8.1)

## 2022-01-07 LAB — TROPONIN I (HIGH SENSITIVITY): Troponin I (High Sensitivity): 16 ng/L (ref <18)

## 2022-01-07 LAB — CBG MONITORING, ED: Glucose-Capillary: 186 mg/dL — ABNORMAL HIGH (ref 70–99)

## 2022-01-07 LAB — BRAIN NATRIURETIC PEPTIDE: B Natriuretic Peptide: 68.1 pg/mL (ref 0.0–100.0)

## 2022-01-07 MED ORDER — HYDRALAZINE HCL 25 MG PO TABS
25.0000 mg | ORAL_TABLET | Freq: Once | ORAL | Status: AC
  Administered 2022-01-07: 25 mg via ORAL
  Filled 2022-01-07: qty 1

## 2022-01-07 MED ORDER — LABETALOL HCL 5 MG/ML IV SOLN
20.0000 mg | Freq: Once | INTRAVENOUS | Status: AC
  Administered 2022-01-07: 20 mg via INTRAVENOUS
  Filled 2022-01-07: qty 4

## 2022-01-07 NOTE — ED Triage Notes (Signed)
Patient states she had a syncopal episode and fell on 12/28/21. Patient states she hit her head, left shoulder, and left breast. Patient reports that she is on Plavix.  Patient c/o dizziness and intermittent blurred vision. Patient also reports that she was having dizziness  prior to the fall and had been taking Meclizine

## 2022-01-07 NOTE — ED Provider Notes (Signed)
I provided a substantive portion of the care of this patient.  I personally performed the entirety of the history, exam, and medical decision making for this encounter.     Patient concerned about syncopal episodes and falls.  The last time was December 28, 2021.  States she hit her head left shoulder left breast patient reports that she is on  Plavix.  Patient talks about having some dizziness prior to the fall and had been taking meclizine.  So nothing new or worse.  Nothing new from a focal neuro exam.  X-rays here without any acute findings.  Patient though did have significantly elevated blood pressures.  Here patient tells me that she was recently started on hydralazine.  Was given a dose of hydralazine here pressures improved some 186/70.  But now she is kind of back up to the 916 systolic.  But patient does have a primary care doctor.  Do not see any hypertensive emergency requiring admission.  I think patient can follow-up with her primary care doctor for blood pressure adjustments.   Fredia Sorrow, MD 01/07/22 [phone]

## 2022-01-07 NOTE — Discharge Instructions (Signed)
You have been evaluated for your fall from a week ago.  Fortunately CT scan and x-rays that was obtained today did not show any broken bone or significant internal injury.  You may continue to take Tylenol as needed for aches and pain.  Follow-up with your doctor for further care.  Your blood pressure is elevated today.  It did improve with blood pressure medication however please discuss this with your primary care doctor at your earliest convenient for better management.  If you develop severe headache, confusion, having focal numbness or focal weakness or if you have any concern do not hesitate to return to the ER for further assessment.

## 2022-01-07 NOTE — ED Provider Triage Note (Signed)
Emergency Medicine Provider Triage Evaluation Note  [name] , a 88 y.o. female  was evaluated in triage.  Pt complains of syncopal episode with prodromal dizziness 10 days ago.  She continues to have residual pain to the left side of her head, did not lose consciousness.  She is currently on Plavix, has iced her head but there has not been much improvement.  Also endorsing pain along the left arm worsened with lifting above her head.  Review of Systems  Positive: Headache, left arm pain, rib pain Negative: LOC, nausea, vomiting  Physical Exam  BP (!) 192/103 (BP Location: Left Arm)    Pulse 88    Temp 97.8 F (36.6 C) (Oral)    Resp 18    Ht 5\' 3"  (1.6 m)    Wt 67.1 kg    SpO2 96%    BMI 26.22 kg/m  Gen:   Awake, no distress   Resp:  Normal effort  MSK:   Moves extremities without difficulty  Other:    Medical Decision Making  Medically screening exam initiated at 12:21 PM.  Appropriate orders placed.  [name] was informed that the remainder of the evaluation will be completed by another provider, this initial triage assessment does not replace that evaluation, and the importance of remaining in the ED until their evaluation is complete.     Janeece Fitting, PA-C 01/07/22 1224

## 2022-01-07 NOTE — ED Notes (Signed)
Pt in xray

## 2022-01-07 NOTE — ED Provider Notes (Signed)
[facility] DEPT Provider Note   CSN: [id number] Arrival date & time: 01/07/22  1046     History  Chief Complaint  Patient presents with   Fall   Loss of Consciousness    [name] is a 88 y.o. female.  The history is provided by the patient, medical records and a relative. No language interpreter was used.  Fall  Loss of Consciousness  88 year old female significant history of PAD, CAD, hypertension, CHF, prior stroke on Plavix, diabetes, presenting for evaluation of Syncope.  History obtained through patient and through daughter who is at bedside.  Patient report 10 days ago she was in the bathroom using the bathroom when she had a syncopal episode and found herself on the ground.  States she did hit her head and her left breast against the ground and has had pain to the affected area.  Pain was sharp achy throbbing waxing waning mild this time.  Most her pain is now in her left breast more noticeable when she reached for something.  For the past 2 days she also endorsed some chest heaviness and occasional bouts of shortness of breath worse with exertion.  No significant chest pain at this time.  Patient also noticed that her blood pressure is elevated today and states that she has been compliant with her blood pressure medication.  She denies any recent sickness, denies any focal numbness or focal weakness or dysuria.  She denies any problems with increased confusion.  She does report bouts of dizziness with room spinning sensation since the fall but none at this moment.  She walks with a cane.  Patient is at home by herself she is here at the urging of her family  Home Medications Prior to Admission medications   Medication Sig Start Date End Date Taking? Authorizing Provider  acetaminophen (TYLENOL) 650 MG CR tablet Take 650 mg by mouth every 8 (eight) hours as needed for pain.    [provider]  acetaminophen-codeine (TYLENOL #3) 300-30 MG  tablet Take 1-2 tablets by mouth every 8 (eight) hours as needed. 03/20/21   Mcarthur Rossetti, MD  carvedilol (COREG) 6.25 MG tablet TAKE 1 TABLET BY MOUTH TWICE A DAY 04/13/21   Lorretta Harp, MD  Cholecalciferol (VITAMIN D3) 10 MCG (400 UNIT) tablet Take 400 Units by mouth daily.    [provider]  clopidogrel (PLAVIX) 75 MG tablet Take 75 mg by mouth daily.    [provider]  Coenzyme Q10 (CO Q 10 PO) Take 1 tablet by mouth every other day.     [provider]  ezetimibe (ZETIA) 10 MG tablet Take 10 mg by mouth at bedtime.    [provider]  folic acid (FOLVITE) 1 MG tablet Take 1 mg by mouth daily. 08/21/18   [provider]  gabapentin (NEURONTIN) 400 MG capsule Take 400 mg by mouth 3 (three) times daily. 06/09/20   [provider]  glimepiride (AMARYL) 4 MG tablet Take 4 mg by mouth daily with breakfast.    [provider]  hydrALAZINE (APRESOLINE) 25 MG tablet Take 1 tablet (25 mg total) by mouth in the morning and at bedtime. 11/04/21   Lorretta Harp, MD  isosorbide mononitrate (IMDUR) 60 MG 24 hr tablet TAKE 1.5 TABLETS (90 MG TOTAL) BY MOUTH DAILY. Marland Kitchen Patient taking differently: Take 60 mg by mouth daily. . 01/19/21   Lorretta Harp, MD  methotrexate (RHEUMATREX) 2.5 MG tablet Take 20  mg by mouth every Friday. (8 tablets) 08/21/18   [provider]  MULTIPLE VITAMIN-FOLIC ACID PO     [provider]  nitroGLYCERIN (NITROSTAT) 0.4 MG SL tablet PLACE 1 TABLET (0.4 MG TOTAL) UNDER THE TONGUE EVERY 5 (FIVE) MINUTES AS NEEDED FOR CHEST PAIN. 02/13/18   Theora Gianotti, NP  pantoprazole (PROTONIX) 40 MG tablet Take 1 tablet (40 mg total) by mouth 2 (two) times daily before a meal. 03/19/20   Lavina Hamman, MD  predniSONE (DELTASONE) 5 MG tablet Take 5 mg by mouth daily with breakfast.     [provider]  rosuvastatin (CRESTOR) 40 MG tablet TAKE 1 TABLET BY MOUTH EVERYDAY AT BEDTIME 01/16/21    Lorretta Harp, MD  sulfaSALAzine (AZULFIDINE) 500 MG EC tablet Take 500 mg by mouth 2 (two) times daily.    [provider]      Allergies    Fish allergy, Iodinated contrast media, Latex, Shellfish allergy, Amlodipine, Evolocumab, Other, and Metformin    Review of Systems   Review of Systems  Cardiovascular:  Positive for syncope.  All other systems reviewed and are negative.  Physical Exam Updated Vital Signs BP (!) 254/98    Pulse 67    Temp 97.8 F (36.6 C) (Oral)    Resp (!) 22    Ht 5\' 3"  (1.6 m)    Wt 67.1 kg    SpO2 99%    BMI 26.22 kg/m  Physical Exam Vitals and nursing note reviewed.  Constitutional:      General: She is not in acute distress.    Appearance: She is well-developed.  HENT:     Head: Normocephalic and atraumatic.     Comments: Mild tenderness to left parietal region without bruising or crepitus Eyes:     Extraocular Movements: Extraocular movements intact.     Conjunctiva/sclera: Conjunctivae normal.     Pupils: Pupils are equal, round, and reactive to light.  Cardiovascular:     Rate and Rhythm: Normal rate and regular rhythm.     Pulses: Normal pulses.     Heart sounds: Normal heart sounds.  Pulmonary:     Effort: Pulmonary effort is normal.  Chest:     Chest wall: Tenderness (Tenderness to left breast without bruising noted.  Exam with chaperone) present.  Abdominal:     Palpations: Abdomen is soft.  Musculoskeletal:     Cervical back: Normal range of motion and neck supple.     Right lower leg: Edema present.     Left lower leg: Edema present.     Comments: 2+ pitting edema to BLE  Skin:    Findings: No rash.  Neurological:     Mental Status: She is alert and oriented to person, place, and time. Mental status is at baseline.     Comments: Neurologic exam:  Speech clear, pupils equal round reactive to light, extraocular movements intact  Normal peripheral visual fields Cranial nerves III through XII normal including no  facial droop Follows commands, moves all extremities x4, normal strength to bilateral upper and lower extremities at all major muscle groups including grip Sensation normal to light touch Coordination intact, no limb ataxia, finger-nose-finger normal Rapid alternating movements normal No pronator drift Gait not tested   Psychiatric:        Mood and Affect: Mood normal.    ED Results / Procedures / Treatments   Labs (all labs ordered are listed, but only abnormal results are displayed) Labs Reviewed  CBC WITH DIFFERENTIAL/PLATELET - Abnormal; Notable for the following components:      Result Value   Hemoglobin 11.4 (*)    All other components within normal limits  COMPREHENSIVE METABOLIC PANEL - Abnormal; Notable for the following components:   Glucose, Bld 191 (*)    Calcium 8.8 (*)    All other components within normal limits  CBG MONITORING, ED - Abnormal; Notable for the following components:   Glucose-Capillary 186 (*)    All other components within normal limits  BRAIN NATRIURETIC PEPTIDE  TROPONIN I (HIGH SENSITIVITY)  TROPONIN I (HIGH SENSITIVITY)    EKG None ED ECG REPORT   Date: 01/07/2022  Rate: 66  Rhythm: normal sinus rhythm  QRS Axis: normal  Intervals: normal  ST/T Wave abnormalities: nonspecific T wave changes  Conduction Disutrbances:none  Narrative Interpretation:   Old EKG Reviewed: unchanged  I have personally reviewed the EKG tracing and agree with the computerized printout as noted.   Radiology DG Chest 2 View  Result Date: 01/07/2022 CLINICAL DATA:  Syncope EXAM: CHEST - 2 VIEW COMPARISON:  Chest x-ray dated September 23, 2021 FINDINGS: Cardiac and mediastinal contours are unchanged post median sternotomy and CABG. Low lung volumes with hypoventilatory changes. No focal consolidation. No pleural effusion or pneumothorax. IMPRESSION: No active cardiopulmonary disease. Electronically Signed   By: Yetta Glassman M.D.   On: 01/07/2022 13:31   DG  Ribs Unilateral W/Chest Left  Result Date: 01/07/2022 CLINICAL DATA:  Fall. EXAM: LEFT RIBS AND CHEST - 3+ VIEW COMPARISON:  None. FINDINGS: No fracture or other bone lesions are seen involving the ribs. There is no evidence of pneumothorax or pleural effusion. Both lungs are clear. Heart size and mediastinal contours are within normal limits. IMPRESSION: No evidence of rib fracture. Severe degenerative changes seen involving the left glenohumeral joint. Electronically Signed   By: Marijo Conception M.D.   On: 01/07/2022 13:30   CT HEAD WO CONTRAST (5MM)  Result Date: 01/07/2022 CLINICAL DATA:  Trauma EXAM: CT HEAD WITHOUT CONTRAST CT CERVICAL SPINE WITHOUT CONTRAST TECHNIQUE: Multidetector CT imaging of the head and cervical spine was performed following the standard protocol without intravenous contrast. Multiplanar CT image reconstructions of the cervical spine were also generated. RADIATION DOSE REDUCTION: This exam was performed according to the departmental dose-optimization program which includes automated exposure control, adjustment of the mA and/or kV according to patient size and/or use of iterative reconstruction technique. COMPARISON:  Head CT dated May 05, 2020 FINDINGS: CT HEAD FINDINGS Brain: Mild chronic white matter ischemic change. No evidence of acute infarction, hemorrhage, hydrocephalus, extra-axial collection or mass lesion/mass effect. Vascular: No hyperdense vessel or unexpected calcification. Skull: Normal. Negative for fracture or focal lesion. Sinuses/Orbits: No acute finding. Other: None. CT CERVICAL SPINE FINDINGS Alignment: Normal. Skull base and vertebrae: No acute fracture. No primary bone lesion or focal pathologic process. Soft tissues and spinal canal: No prevertebral fluid or swelling. No visible canal hematoma. Disc levels:  Multilevel moderate degenerative disc disease. Upper chest: Negative. Other: None. IMPRESSION: 1. No acute intracranial abnormality. 2. No evidence of  acute cervical spine injury. Electronically Signed   By: Yetta Glassman M.D.   On: 01/07/2022 13:45   CT Cervical Spine Wo Contrast  Result Date: 01/07/2022 CLINICAL DATA:  Trauma EXAM: CT HEAD WITHOUT CONTRAST CT CERVICAL SPINE WITHOUT CONTRAST TECHNIQUE: Multidetector CT imaging of the head and cervical spine was performed following the standard protocol without intravenous contrast. Multiplanar CT image reconstructions of the cervical spine  were also generated. RADIATION DOSE REDUCTION: This exam was performed according to the departmental dose-optimization program which includes automated exposure control, adjustment of the mA and/or kV according to patient size and/or use of iterative reconstruction technique. COMPARISON:  Head CT dated May 05, 2020 FINDINGS: CT HEAD FINDINGS Brain: Mild chronic white matter ischemic change. No evidence of acute infarction, hemorrhage, hydrocephalus, extra-axial collection or mass lesion/mass effect. Vascular: No hyperdense vessel or unexpected calcification. Skull: Normal. Negative for fracture or focal lesion. Sinuses/Orbits: No acute finding. Other: None. CT CERVICAL SPINE FINDINGS Alignment: Normal. Skull base and vertebrae: No acute fracture. No primary bone lesion or focal pathologic process. Soft tissues and spinal canal: No prevertebral fluid or swelling. No visible canal hematoma. Disc levels:  Multilevel moderate degenerative disc disease. Upper chest: Negative. Other: None. IMPRESSION: 1. No acute intracranial abnormality. 2. No evidence of acute cervical spine injury. Electronically Signed   By: Yetta Glassman M.D.   On: 01/07/2022 13:45    Procedures Procedures    Medications Ordered in ED Medications  hydrALAZINE (APRESOLINE) tablet 25 mg (25 mg Oral Given 01/07/22 1542)  labetalol (NORMODYNE) injection 20 mg (20 mg Intravenous Given 01/07/22 1542)    ED Course/ Medical Decision Making/ A&P                           Medical Decision  Making Amount and/or Complexity of Data Reviewed Labs: ordered.  Risk Prescription drug management.   BP (!) 254/98    Pulse 67    Temp 97.8 F (36.6 C) (Oral)    Resp (!) 22    Ht 5\' 3"  (1.6 m)    Wt 67.1 kg    SpO2 99%    BMI 26.22 kg/m   3:30 PM Patient presents for evaluation of a prior fall 10 days ago when she fell in the bathroom while using the bathroom.  She fell on her left side and complaining of pain to her head neck and left chest specifically her left breast.  Fortunately imaging obtained today showing no significant signs of injury.  Patient is mentating appropriately, no signs of stroke.  Patient however on was noted to be hypertensive with a blood pressure of 254/98.  She does have history of high blood pressure and is currently on several blood pressure medications.  She reports she is compliant with her medications.  Will provide blood pressure management however at this time no obvious signs of endorgan damage to suggest hypertensive emergency.  5:23 PM Labs, EKG, and imaging was independently reviewed and interpreted by me.  As mentioned earlier no significant signs of electrolyte derangement.  Mild hyperglycemia with a CBG of 186.  EKG unchanged from prior, normal troponin, normal BNP, CT scan of the head and cervical spine as well as x-ray of the chest and left ribs show no acute finding.  After the patient receive labetalol and hydralazine in the ED, blood pressure steadily improved.  I discussed care with DR. Rogene Houston who has seen and evaluated pt. We felt pt is stable to return home.  Daughter at bedside felt comfortable providing care for pt.  Strict return precaution given.    This patient presents to the ED for concern of syncope, chest pain, this involves an extensive number of treatment options, and is a complaint that carries with it a high risk of complications and morbidity.  The differential diagnosis includes stroke, Hypertensive emergency, ACS,   Co  morbidities that  complicate the patient evaluation prior stroke on plavix, CAD, CHF, DM Additional history obtained:  Additional history obtained from cardiology note from 11/04/21 when she was seen for HTN and syncope likely 2/2 dehydration from diuretic. External records from outside source obtained and reviewed including labs, and prior vital sign  Lab Tests:  I Ordered, and personally interpreted labs.  The pertinent results include:  elevated CBG of 186  Imaging Studies ordered:  I ordered imaging studies including head and cervical spine CT along with xray of chest and L ribs I independently visualized and interpreted imaging which showed no concerning injury I agree with the radiologist interpretation  Cardiac Monitoring:  The patient was maintained on a cardiac monitor.  I personally viewed and interpreted the cardiac monitored which showed an underlying rhythm of: sinus rhythm, 66 HR with borderline T wave abnormalities  Medicines ordered and prescription drug management:  I ordered medication including hydralazine and labetalol  for hypertension Reevaluation of the patient after these medicines showed that the patient improved I have reviewed the patients home medicines and have made adjustments as needed           Final Clinical Impression(s) / ED Diagnoses Final diagnoses:  Fall in home, initial encounter  Hypertensive urgency    Rx / DC Orders ED Discharge Orders     None         Domenic Moras, PA-C 01/07/22 1729    Fredia Sorrow, MD 01/07/22 2340

## 2022-01-10 ENCOUNTER — Other Ambulatory Visit: Payer: Self-pay | Admitting: Cardiovascular Disease

## 2022-02-03 NOTE — Progress Notes (Signed)
Cardiology Office Note:    Date:  02/04/2022   ID:  [name], [name] 01/24/1933, MRN [id number]  PCP:  [name], [place] Cardiologist: Monica Hew, MD / Dr. Gwenlyn Flores  Reason for visit: 53-month follow-up  History of Present Illness:    [name] is a 88 y.o. female with a hx of CAD status post CABG in 1998, PAD with intervention in 2014.  Low risk stress test in 2021.  Previously admitted for syncopal episodes thought secondary to dehydration.  Lasix was discontinued.  She saw our Pharm.D. in November 2022 for hypertension management.  Amlodipine discontinued secondary to edema.  She started hydralazine 25 mg twice a day.  Not started on ACE/ARB or diuretic 2/2 dehydration, low sodium and high K on labs.  Today, she complains of chest tightness when she laughs but its not every time.  She does not notice chest pain when she is walking or doing her chores.  She does not remember her angina symptoms before her CABG.  She does notice that she tires more quickly.  When she is doing activities, her legs do ache even when washing the dishes.  She has more discomfort in her left leg than her right.  She feels a numb ache, it is sore and tender.  She has lower extremity edema in the lower legs.  She notices that any scratches take longer to heal as a diabetic.  She has no active sores.  She has compression stockings but has not been wearing them regularly.  She sometimes notices toe cramping.  She notices when she is in a hurry she can feel her heart beating faster.  She tries to breathe to calm it down.  She does not notice palpitations at rest.  She states her breathing is okay.  No PND.    She does not lay flat secondary to vertigo.  She denies any diaphoresis or syncope.     Past Medical History:  Diagnosis Date   Allergy to IVP dye    Angina, class I ([place]) 04/22/2015   Atherosclerotic heart disease native coronary artery w/angina pectoris ([place]) 1992   a. 1992 s/p PTCA  of LAD;  b. 1998 CABG x 3; c. 07/2001 Cath: Sev LM/LAD/LCX dzs, 3/3 patent grafts; d. 11/2013 Cath: stable graft anatomy; e. 10/2016 Cath: native 3VD, VG->OM nl, LIMA->LAD nl, VG->Diag 55.   Atherosclerotic heart disease of native coronary artery with angina pectoris ([place]) 01/14/2010   Qualifier: Diagnosis of  By: Deatra Ina MD, Sandy Salaam    Atrial septal aneurysm    Barrett's esophagus 03/04/2010   Qualifier: Diagnosis of  By: Shane Crutch, Amy S    Bilateral leg edema 04/22/2015   Bradycardia, mild briefly to the 40s, asymptomatic 05/16/2013   Carotid arterial disease ([place])    a. 05/2014 Carotid U/S: No signif bilat ICA stenosis, >60% L ECA.   Chronic anemia    Chronic diastolic CHF (congestive heart failure) ([place])    a. 10/2016 Echo: Ef 55-60%, no rwma, Gr1 DD, triv AI, PASP 77mmHg, atrial septal aneurysm.   Chronic diastolic CHF (congestive heart failure), NYHA class 2 ([place]) 08/11/2017   Claudication ([place]) 05/16/2013   Peripheral arterial occlusive disease with lifestyle limiting claudication    Degenerative joint disease 05/30/2018   DIVERTICULOSIS OF COLON 01/14/2010   Qualifier: Diagnosis of  By: Deatra Ina MD, Sandy Salaam    DM (diabetes mellitus), type 2 with peripheral vascular complications ([place])    On Oral medications.  DOE (dyspnea on exertion) 04/22/2015   Essential hypertension    FECAL INCONTINENCE 03/04/2010   Qualifier: Diagnosis of  By: Trellis Paganini PA-c, Amy S    GERD (gastroesophageal reflux disease)    Hyperlipidemia with target LDL less than 70    Inflammatory arthritis 10/13/2017   Neuropathy    Non-insulin dependent type 2 diabetes mellitus ([place]) 10/24/2015   NSTEMI (non-ST elevated myocardial infarction) ([place]) 11/01/2016   PAD (peripheral artery disease) ([place]) 05/2013; 09/2013   a. severe calcified POP-1 & TP Trunk Dz bilat;  b . 05/2013 Diamondback Rot Athrectomy (R POP) --> PTA R Pop, DES to R Peroneal;  c. 09/2013 PTA/Stenting of L Pop [name] - retrograde access);  d.  04/2014 ABI's: R/L: 1.1/1.1.   Preoperative cardiovascular examination 04/15/2020   PUD (peptic ulcer disease)    Rheumatoid arthritis ([place])    S/P CABG x 3 1998   LIMA-LAD, SVG-OM, SVG-D1   Severe claudication ([place]) 05/2013   Referred for Peripheral Angio (Dr. Gwenlyn Flores --> Dr. Brunetta Jeans in [place])   [place] 01/14/2010   Qualifier: Diagnosis of  By: Harlon Ditty CMA ([place]), [name]     Status post total replacement of right hip 10/12/2018   Vertigo 01/02/2014    Past Surgical History:  Procedure Laterality Date   ATHERECTOMY Right 05/15/2013   Procedure: ATHERECTOMY;  Surgeon: Lorretta Harp, MD;  Location[facility] C[facility] LAB;  Service: Cardiovascular;  Laterality: Right;  popliteal   BACK SURGERY     CARDIAC CATHETERIZATION  07/17/01   2 v CAD with LM, circ, LAD, obtuse diag, mod stenosis of diag vein graft , mild stenosis of marg vein graft, nl EF, medical treatment   CARDIAC CATHETERIZATION  11/2013   Widely patent LIMA-LAD & SVG-OM with mild progression of proximal stenosis ~40-50% in SVG-D1; Widely patent native RCA with known severe native LCA disease   CARDIAC CATHETERIZATION N/A 11/01/2016   Procedure: Left Heart Cath and Cors/Grafts Angiography;  Surgeon: [name], MD;  Location: [place] CV LAB;  Service: Cardiovascular: Hemodynamics: High LVEDP!.  Cors/Grafts: LM 55%, o-p LAD 100% then after D1 -> LIMA-LAD patent, SVG-Diag ~55%. small RI wiht ~70% ostial. O-p Cx 80% - pCx 70% --> SVG-bifurcating OM patent. -- med Rx   CORONARY ANGIOPLASTY  1992   PCI to LAD   CORONARY ARTERY BYPASS GRAFT  1998   LIMA-LAD, SVG-diagonal (none roughly 50% stenosis), SVG-OM   LEFT HEART CATHETERIZATION WITH CORONARY ANGIOGRAM N/A 11/27/2013   Procedure: LEFT HEART CATHETERIZATION WITH CORONARY ANGIOGRAM;  Surgeon: [name], MD;  Location: [facility] CATH LAB;  Service: Cardiovascular;  Laterality: N/A;   LOW EXTREMITY DOPPLERS/ABI  10/2016   Patent right SFA, popliteal and peroneal  tendons. Patent left SFA and popliteal stent. 30-50% stenosis in the right common femoral and popliteal arteries, 50-74% stenosis in left profunda femoris artery. --> 1 year follow-up.  RABI - 1.2, LABI 1.1   LOWER EXTREMITY ANGIOGRAM N/A 02/22/2013   Procedure: LOWER EXTREMITY ANGIOGRAM;  Surgeon: Lorretta Harp, MD;  Location: [facility] CATH LAB;  Service: Cardiovascular;  Laterality: N/A;   LOWER EXTREMITY ANGIOGRAM N/A 07/20/2013   Procedure: LOWER EXTREMITY ANGIOGRAM;  Surgeon: Lorretta Harp, MD;  Location: [facility] CATH LAB;  Service: Cardiovascular;  Laterality: N/A;   NM MYOVIEW LTD  04/03/2013; 5/26'/2016   Lexiscan: a)  Apical perfusion defect with no ischemia. Consider prior infarct versus breast attenuation.;; b) 5/'16: LOW RISK, Nl EF ~55-65%, No Ischemia /Infarction   NM MYOVIEW  LTD  10/'19; 4/'21   a) LOW RISK.  EF 55%.  No ischemia or infarction. b) EF estimated 81%.  Suboptimal images.  May be subtle inferior ischemia-initially read by radiology as intermediate risk, however overread by cardiology to be mild low risk.Marland Kitchen   PERCUTANEOUS STENT INTERVENTION Right 05/15/2013   Procedure: PERCUTANEOUS STENT INTERVENTION;  Surgeon: Lorretta Harp, MD;  Location: [facility] CATH LAB;  Service: Cardiovascular;  Laterality: Right;  tibioperoneal trunk   Peroneal Artery Stent  05/15/13   Diamondback rot. atherectomy of high grade segmental popliteal stenosis then Chocolate balloonPTA; then angiosculpt PTA of the prox peroneal with stent-Xpedition    pv angio  07/20/13   high-grade calcified popliteal disease with one-vessel runoff via a small anterior tibial artery that has proximal disease as well, no intervention   TOTAL HIP ARTHROPLASTY Right 10/12/2018   Procedure: RIGHT TOTAL HIP ARTHROPLASTY ANTERIOR APPROACH;  Surgeon: Mcarthur Rossetti, MD;  Location: [facility] ORS;  Service: Orthopedics;  Laterality: Right;   TOTAL HIP ARTHROPLASTY Left 11/21/2020   Procedure: LEFT TOTAL HIP ARTHROPLASTY ANTERIOR APPROACH;   Surgeon: Mcarthur Rossetti, MD;  Location: [facility] ORS;  Service: Orthopedics;  Laterality: Left;   TRANSTHORACIC ECHOCARDIOGRAM  11/'17; 10/19   a) EF 55-60%. Gr 1 DD. Normal PAP. Aortic Sclerosis.;; b) Normal LV size and function with vigorous EF 65 to 70%.  GR 1 DD.  Severe LA dilation (suggestive of probably worsening: GR 1 DD).   TRANSTHORACIC ECHOCARDIOGRAM  03/18/2020   EF 70-75%.  Hyperdynamic.  GR 1 DD.  No R WMA.  Mild LA dilation.  Mild aortic valve sclerosis but no stenosis.-No change from prior echo   TUBAL LIGATION      Current Medications: Current Meds  Medication Sig   acetaminophen (TYLENOL) 650 MG CR tablet Take 650 mg by mouth every 8 (eight) hours as needed for pain.   carvedilol (COREG) 6.25 MG tablet TAKE 1 TABLET BY MOUTH TWICE A DAY   Cholecalciferol (VITAMIN D3) 10 MCG (400 UNIT) tablet Take 400 Units by mouth daily.   clopidogrel (PLAVIX) 75 MG tablet Take 75 mg by mouth daily.   Coenzyme Q10 (CO Q 10 PO) Take 1 tablet by mouth every other day.    ezetimibe (ZETIA) 10 MG tablet Take 10 mg by mouth at bedtime.   folic acid (FOLVITE) 1 MG tablet Take 1 mg by mouth daily.   gabapentin (NEURONTIN) 400 MG capsule Take 400 mg by mouth 3 (three) times daily.   glimepiride (AMARYL) 2 MG tablet Take 2 mg by mouth daily as needed.   hydrochlorothiazide (MICROZIDE) 12.5 MG capsule Take 1 capsule (12.5 mg total) by mouth daily.   isosorbide mononitrate (IMDUR) 60 MG 24 hr tablet TAKE 1.5 TABLETS (90 MG TOTAL) BY MOUTH DAILY. . (Patient taking differently: Take 60 mg by mouth daily. Marland Kitchen)   methotrexate (RHEUMATREX) 2.5 MG tablet Take 20 mg by mouth every Sunday. (8 tablets)   nitroGLYCERIN (NITROSTAT) 0.4 MG SL tablet PLACE 1 TABLET (0.4 MG TOTAL) UNDER THE TONGUE EVERY 5 (FIVE) MINUTES AS NEEDED FOR CHEST PAIN.   pantoprazole (PROTONIX) 40 MG tablet Take 1 tablet (40 mg total) by mouth 2 (two) times daily before a meal.   predniSONE (DELTASONE) 5 MG tablet Take 5 mg by mouth  daily with breakfast.    rosuvastatin (CRESTOR) 40 MG tablet TAKE 1 TABLET BY MOUTH EVERYDAY AT BEDTIME   sulfaSALAzine (AZULFIDINE) 500 MG EC tablet Take 500 mg by mouth 2 (two) times  daily.   [DISCONTINUED] hydrALAZINE (APRESOLINE) 25 MG tablet TAKE 1 TABLET (25 MG TOTAL) BY MOUTH IN THE Flores AND AT BEDTIME.     Allergies:   Fish allergy, Iodinated contrast media, Latex, Shellfish allergy, Amlodipine, Evolocumab, Other, and Metformin   Social History   Socioeconomic History   Marital status: Married    Spouse name: Not on file   Number of children: Not on file   Years of education: Not on file   Highest education level: Not on file  Occupational History   Occupation: retired  Tobacco Use   Smoking status: Never   Smokeless tobacco: Never  Vaping Use   Vaping Use: Never used  Substance and Sexual Activity   Alcohol use: Not Currently    Comment: rare glass of wine    Drug use: No   Sexual activity: Not on file  Other Topics Concern   Not on file  Social History Narrative   Right handed   Lives alone ( husband in rehab facility)   Caffeine- mostly coffee       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   Social Determinants of Health   Financial Resource Strain: Not on file  Food Insecurity: Not on file  Transportation Needs: Not on file  Physical Activity: Not on file  Stress: Not on file  Social Connections: Not on file     Family History: The patient's family history includes Hyperlipidemia in her father and mother; Hypertension in her father and mother. She was adopted.  ROS:   Please see the history of present illness.     EKGs/Labs/Other Studies Reviewed:    EKG:  The ekg ordered today demonstrates normal sinus rhythm, heart rate 85, PR interval 198 ms, QRS duration 78 ms.  Recent Labs: 01/07/2022: ALT 15; B Natriuretic Peptide 68.1; BUN 18; Creatinine, Ser 0.92; Hemoglobin 11.4; Platelets 265; Potassium 4.2; Sodium 139   Recent Lipid Panel Lab Results  Component Value  Date/Time   CHOL 177 03/19/2020 12:29 AM   CHOL 138 05/19/2017 12:54 PM   TRIG 35 03/19/2020 12:29 AM   HDL 51 03/19/2020 12:29 AM   HDL 54 05/19/2017 12:54 PM   LDLCALC 119 (H) 03/19/2020 12:29 AM   LDLCALC 73 05/19/2017 12:54 PM    Physical Exam:    VS:  BP 124/80    Pulse 85    Ht 5\' 2"  (1.575 m)    Wt 154 lb 9.6 oz (70.1 kg)    SpO2 96%    BMI 28.28 kg/m    No data Flores.  Wt Readings from Last 3 Encounters:  02/04/22 154 lb 9.6 oz (70.1 kg)  01/07/22 148 lb (67.1 kg)  11/04/21 153 lb 9.6 oz (69.7 kg)     GEN: no acute distress, elderly HEENT: Normal NECK: No JVD; No carotid bruits CARDIAC: RRR, no murmurs, rubs, gallops RESPIRATORY:  Clear to auscultation without rales, wheezing or rhonchi  ABDOMEN: Soft, non-tender, non-distended MUSCULOSKELETAL: 1+ edema to bilateral shins, ruddy appearance, no open sores. SKIN: Warm and dry NEUROLOGIC:  Alert and oriented PSYCHIATRIC:  Normal affect     ASSESSMENT AND PLAN   CAD  Precordial pain, Decreased exercise tolerance -Status post CABG in 1998 -Myoview stress 03/18/2020 suggested subtle inferior ischemia. -She is concerned about her decreased exercise tolerance.  She is interested in doing a stress test to ensure her heart is okay.  She is on antianginals including carvedilol and Imdur. -Continue Plavix monotherapy and lipid therapy.  PAD -Multiple interventions in 2014;  followed by Dr. Gwenlyn Flores -She complains of leg achiness.  No critical limb ischemia.  Symptoms also complicated by diabetic neuropathy.   -Continue walking as able.   -Continue Plavix and statin therapy. -She is scheduled to see Dr. Alvester Chou next month.  Hypertension, well controlled -We will switch hydralazine to HCTZ to help with her lower extremity edema.  We are now able to use HCTZ since her electrolytes and renal function are normal on blood work January 26. -Recommend she keep a blood pressure log for the next week and check her BMET in 1  week.  Hyperlipidemia -Continue Crestor and Zetia. -Can check fasting lipids in 1 week when she comes in for her BMET.    Chronic diastolic heart failure, mildly hypervolemic -Grade 1 diastolic function on echo April 2021 -Start HCTZ 12.5 mg daily. -Recommend regular compression stocking use and salt restriction.  Disposition - Follow-up with Dr. Alvester Chou next month as scheduled.  Hopefully we can get the stress test done before then.  Follow-up with [name] in 6 months.   Medication Adjustments/Labs and Tests Ordered: Current medicines are reviewed at length with the patient today.  Concerns regarding medicines are outlined above.  Orders Placed This Encounter  Procedures   Basic metabolic panel   Lipid panel   MYOCARDIAL PERFUSION IMAGING   EKG 12-Lead   Meds ordered this encounter  Medications   hydrochlorothiazide (MICROZIDE) 12.5 MG capsule    Sig: Take 1 capsule (12.5 mg total) by mouth daily.    Dispense:  30 capsule    Refill:  11    Patient Instructions  Medication Instructions:  Stop Hydralazine. Start HCTZ 12.5 mg ( Take 1 Tablet Daily). *If you need a refill on your cardiac medications before your next appointment, please call your pharmacy*   Lab Work: BMET, Lipid Panel, : To Be Done in 1 Week. If you have labs (blood work) drawn today and your tests are completely normal, you will receive your results only by: [place] (if you have MyChart) OR A paper copy in the mail If you have any lab test that is abnormal or we need to change your treatment, we will call you to review the results.   Testing/Procedures: [address] Your physician has requested that you have a Lexicographer. For further information please visit [email or web address]. Please follow instruction sheet, as given.    Follow-Up: At [facility], you and your health needs are our priority.  As part of our continuing mission to provide you with exceptional  heart care, we have created designated Provider Care Teams.  These Care Teams include your primary Cardiologist (physician) and Advanced Practice Providers (APPs -  Physician Assistants and Nurse Practitioners) who all work together to provide you with the care you need, when you need it.  We recommend signing up for the patient portal called "MyChart".  Sign up information is provided on this After Visit Summary.  MyChart is used to connect with patients for Virtual Visits (Telemedicine).  Patients are able to view lab/test results, encounter notes, upcoming appointments, etc.  Non-urgent messages can be sent to your provider as well.   To learn more about what you can do with MyChart, go to [email or web address].    Your next appointment:   6 month(s)  The format for your next appointment:   In Person  Provider:   [name], MD     Other Instructions Recommend wear compression stockings daily. Monitor and Log Blood  Pressure for 1 week. Check 1-2 time Daily.    Signed, Warren Lacy, PA-C  02/04/2022 10:39 AM    [facility]

## 2022-02-04 ENCOUNTER — Other Ambulatory Visit: Payer: Self-pay

## 2022-02-04 ENCOUNTER — Ambulatory Visit: Payer: Medicare Other | Admitting: Physician Assistant

## 2022-02-04 ENCOUNTER — Encounter: Payer: Self-pay | Admitting: Physician Assistant

## 2022-02-04 VITALS — BP 124/80 | HR 85 | Ht 62.0 in | Wt 154.6 lb

## 2022-02-04 DIAGNOSIS — I10 Essential (primary) hypertension: Secondary | ICD-10-CM | POA: Diagnosis not present

## 2022-02-04 DIAGNOSIS — I25119 Atherosclerotic heart disease of native coronary artery with unspecified angina pectoris: Secondary | ICD-10-CM

## 2022-02-04 DIAGNOSIS — E785 Hyperlipidemia, unspecified: Secondary | ICD-10-CM | POA: Diagnosis not present

## 2022-02-04 DIAGNOSIS — I5032 Chronic diastolic (congestive) heart failure: Secondary | ICD-10-CM

## 2022-02-04 DIAGNOSIS — I739 Peripheral vascular disease, unspecified: Secondary | ICD-10-CM | POA: Diagnosis not present

## 2022-02-04 MED ORDER — HYDROCHLOROTHIAZIDE 12.5 MG PO CAPS: 12.5000 mg | ORAL_CAPSULE | Freq: Every day | ORAL | 11 refills | Status: DC

## 2022-02-04 NOTE — Patient Instructions (Signed)
Medication Instructions:  Stop Hydralazine. Start HCTZ 12.5 mg ( Take 1 Tablet Daily). *If you need a refill on your cardiac medications before your next appointment, please call your pharmacy*   Lab Work: BMET, Lipid Panel, : To Be Done in 1 Week. If you have labs (blood work) drawn today and your tests are completely normal, you will receive your results only by: [place] (if you have MyChart) OR A paper copy in the mail If you have any lab test that is abnormal or we need to change your treatment, we will call you to review the results.   Testing/Procedures: [address], [address] Your physician has requested that you have a Lexicographer. For further information please visit [email or web address]. Please follow instruction sheet, as given.    Follow-Up: At [facility], you and your health needs are our priority.  As part of our continuing mission to provide you with exceptional heart care, we have created designated Provider Care Teams.  These Care Teams include your primary Cardiologist (physician) and Advanced Practice Providers (APPs -  Physician Assistants and Nurse Practitioners) who all work together to provide you with the care you need, when you need it.  We recommend signing up for the patient portal called "MyChart".  Sign up information is provided on this After Visit Summary.  MyChart is used to connect with patients for Virtual Visits (Telemedicine).  Patients are able to view lab/test results, encounter notes, upcoming appointments, etc.  Non-urgent messages can be sent to your provider as well.   To learn more about what you can do with MyChart, go to [email or web address].    Your next appointment:   6 month(s)  The format for your next appointment:   In Person  Provider:   [name], MD     Other Instructions Recommend wear compression stockings daily. Monitor and Log Blood Pressure for 1 week. Check 1-2 time Daily.

## 2022-02-05 ENCOUNTER — Telehealth (HOSPITAL_COMMUNITY): Payer: Self-pay | Admitting: *Deleted

## 2022-02-05 NOTE — Telephone Encounter (Signed)
Left message on voicemail per DPR in reference to upcoming appointment scheduled on 02/08/2022 at 10:45 with detailed instructions given per Myocardial Perfusion Study Information Sheet for the test. LM to arrive 15 minutes early, and that it is imperative to arrive on time for appointment to keep from having the test rescheduled. If you need to cancel or reschedule your appointment, please call the office within 24 hours of your appointment. Failure to do so may result in a cancellation of your appointment, and a $50 no show fee. Phone number given for call back for any questions.

## 2022-02-08 ENCOUNTER — Ambulatory Visit (HOSPITAL_COMMUNITY): Payer: Medicare Other | Attending: Physician Assistant

## 2022-02-08 ENCOUNTER — Encounter (INDEPENDENT_AMBULATORY_CARE_PROVIDER_SITE_OTHER): Payer: Self-pay

## 2022-02-08 ENCOUNTER — Other Ambulatory Visit (HOSPITAL_COMMUNITY): Payer: Self-pay | Admitting: Physician Assistant

## 2022-02-08 ENCOUNTER — Ambulatory Visit (HOSPITAL_COMMUNITY): Payer: Medicare Other

## 2022-02-08 ENCOUNTER — Other Ambulatory Visit: Payer: Self-pay

## 2022-02-08 ENCOUNTER — Encounter (HOSPITAL_COMMUNITY): Payer: Self-pay

## 2022-02-08 VITALS — Ht 62.0 in | Wt 154.0 lb

## 2022-02-08 DIAGNOSIS — R079 Chest pain, unspecified: Secondary | ICD-10-CM

## 2022-02-08 DIAGNOSIS — I25119 Atherosclerotic heart disease of native coronary artery with unspecified angina pectoris: Secondary | ICD-10-CM | POA: Diagnosis not present

## 2022-02-08 DIAGNOSIS — I5032 Chronic diastolic (congestive) heart failure: Secondary | ICD-10-CM | POA: Diagnosis not present

## 2022-02-08 DIAGNOSIS — Z951 Presence of aortocoronary bypass graft: Secondary | ICD-10-CM | POA: Diagnosis present

## 2022-02-08 LAB — MYOCARDIAL PERFUSION IMAGING
Base ST Depression (mm): 0 mm
LV dias vol: 61 mL (ref 46–106)
LV sys vol: 24 mL
Nuc Stress EF: 61 %
Rest Nuclear Isotope Dose: 10.4 mCi
SDS: 6
SRS: 1
SSS: 7
ST Depression (mm): 0 mm
Stress Nuclear Isotope Dose: 31.4 mCi
TID: 1.37

## 2022-02-08 MED ORDER — REGADENOSON 0.4 MG/5ML IV SOLN
0.4000 mg | Freq: Once | INTRAVENOUS | Status: AC
  Administered 2022-02-08: 0.4 mg via INTRAVENOUS

## 2022-02-08 MED ORDER — TECHNETIUM TC 99M TETROFOSMIN IV KIT
32.4000 | PACK | Freq: Once | INTRAVENOUS | Status: AC | PRN
  Administered 2022-02-08: 32.4 via INTRAVENOUS
  Filled 2022-02-08: qty 33

## 2022-02-08 MED ORDER — TECHNETIUM TC 99M TETROFOSMIN IV KIT
10.4000 | PACK | Freq: Once | INTRAVENOUS | Status: AC | PRN
  Administered 2022-02-08: 10.4 via INTRAVENOUS
  Filled 2022-02-08: qty 11

## 2022-02-10 ENCOUNTER — Telehealth: Payer: Self-pay

## 2022-02-10 NOTE — Telephone Encounter (Addendum)
Called patient regarding results. Patient had understanding of results.----- Message from Warren Lacy, PA-C sent at 02/09/2022  1:38 PM EST ----- ?Great news.  Your stress test shows no concerning areas of blockage.  It shows normal heart squeeze. ?

## 2022-02-16 LAB — LIPID PANEL
Chol/HDL Ratio: 2.9 ratio (ref 0.0–4.4)
Cholesterol, Total: 153 mg/dL (ref 100–199)
HDL: 53 mg/dL (ref >39)
LDL Chol Calc (NIH): 89 mg/dL (ref 0–99)
Triglycerides: 53 mg/dL (ref 0–149)
VLDL Cholesterol Cal: 11 mg/dL (ref 5–40)

## 2022-02-16 LAB — BASIC METABOLIC PANEL
BUN/Creatinine Ratio: 11 — ABNORMAL LOW (ref 12–28)
BUN: 10 mg/dL (ref 8–27)
CO2: 28 mmol/L (ref 20–29)
Calcium: 8.9 mg/dL (ref 8.7–10.3)
Chloride: 103 mmol/L (ref 96–106)
Creatinine, Ser: 0.9 mg/dL (ref 0.57–1.00)
Glucose: 168 mg/dL — ABNORMAL HIGH (ref 70–99)
Potassium: 4.2 mmol/L (ref 3.5–5.2)
Sodium: 141 mmol/L (ref 134–144)
eGFR: 62 mL/min/{1.73_m2} (ref >59)

## 2022-02-17 ENCOUNTER — Ambulatory Visit: Payer: Medicare Other | Admitting: Cardiovascular Disease

## 2022-02-22 ENCOUNTER — Telehealth: Payer: Self-pay | Admitting: *Deleted

## 2022-02-22 NOTE — Telephone Encounter (Signed)
1 year Ortho bundle call completed. ?

## 2022-02-24 ENCOUNTER — Telehealth: Payer: Self-pay

## 2022-02-24 NOTE — Telephone Encounter (Addendum)
Left voice message asking to call office back. ? ?----- Message from Warren Lacy, PA-C sent at 02/24/2022  9:15 AM EDT ----- ?Kidney function and electrolytes are normal after starting HCTZ. ? ?LDL 89.  Triglycerides and HDL are controlled.  Continue Crestor and Zetia. ?

## 2022-02-25 ENCOUNTER — Other Ambulatory Visit: Payer: Self-pay

## 2022-02-25 ENCOUNTER — Telehealth: Payer: Self-pay

## 2022-02-25 NOTE — Telephone Encounter (Addendum)
Called patient regarding results. Patient had understanding of results.----- Message from Warren Lacy, PA-C sent at 02/24/2022  9:15 AM EDT ----- ?Kidney function and electrolytes are normal after starting HCTZ. ? ?LDL 89.  Triglycerides and HDL are controlled.  Continue Crestor and Zetia. ?

## 2022-03-02 ENCOUNTER — Ambulatory Visit: Payer: Medicare Other | Admitting: Cardiovascular Disease

## 2022-03-02 ENCOUNTER — Encounter: Payer: Self-pay | Admitting: Cardiovascular Disease

## 2022-03-02 ENCOUNTER — Other Ambulatory Visit: Payer: Self-pay

## 2022-03-02 DIAGNOSIS — I10 Essential (primary) hypertension: Secondary | ICD-10-CM

## 2022-03-02 DIAGNOSIS — I739 Peripheral vascular disease, unspecified: Secondary | ICD-10-CM

## 2022-03-02 DIAGNOSIS — E785 Hyperlipidemia, unspecified: Secondary | ICD-10-CM

## 2022-03-02 DIAGNOSIS — I25119 Atherosclerotic heart disease of native coronary artery with unspecified angina pectoris: Secondary | ICD-10-CM | POA: Diagnosis not present

## 2022-03-02 NOTE — Progress Notes (Signed)
? ? ? ?03/02/2022 ?[name]   ?1933-08-02  ?[id number] ? ?Primary Physician Monica Morning, DO ?Primary Cardiologist: Monica Harp MD [name], [place] ? ?HPI:  [name] is a 88 y.o.  married African American female patient of Dr. Shanon Brow Flores's and Dr. Leigh Flores. I last saw her in the office  08/14/2021.Marland KitchenShe has known CAD status post coronary bypass grafting in the past (1998). She has preserved LV function and nonischemic Myoview. Because of claudication she underwent peripheral vascular angiography and other [name] 02/22/13 revealing significant popliteal and infrapopliteal disease. She has lifestyle limiting claudication.on 05/16/13 she underwent diamondback orbital rotational atherectomy, PTA of her distal right SFA and stenting using coronary drug-eluting stent for peroneal artery which was her only patent artery on the right. She does not notice any improvement and claudication since her procedure although she says her right leg his less "heavy" and that her left leg is much more symptomatic.I restudied her 07/20/13 with the intention of fixing her left leg however I decided to defer this because of anatomic complexity. Ultimately I  referred her to Dr. Brunetta Flores in [place] who performed intervention on her left popliteal and tibioperoneal trunk on 10/01/13. Followup Dopplers performed 04/29/14 were excellent with ABIs of greater than 1 bilaterally, patent peroneal stent on the right a patent left popliteal. Subsequent Dopplers performed in December showed a right ABI of 0.81 and a left ABI 0.91 with patent stents ?  ?She had done well after intervention and actually said that she was able to ambulate with claudication. Her last Doppler studies performed 11/10/16 revealed normal ABIs bilaterally with patent SFAs and infrapopliteal vessels. She has noticed some superficial hypersensitivity on her left pretibial area that had venous Doppler studies performed recently that showed no evidence  of DVT. ?  ? She did have a right total hip replacement 11/19 which she is still recovering from.  Recent lower extremity arterial Doppler studies performed 06/21/2019 revealed a decline in her right ABI 0..76 with a high-frequency signal in her right popliteal artery and monophasic waveforms below that.  She complains of pain in both legs from the knee down left greater than right at rest and with exertion despite normal left ABIs and a palpable pedal pulse on that side. ?  ? She apparently needs an elective left total knee replacement.  She has difficulty ambulating mostly because of pain in her left leg.  She also has chronic lower extremity edema left greater than right on low-dose furosemide which we are going to increase from 20 to 40 mg a day.  She takes occasional sublingual nitroglycerin every other week for chest pain.  She did have a nonischemic Myoview for preoperative clearance before her right total hip replacement 09/15/2018. ?  ?She was admitted to the hospital on 03/18/2020 with chest pain.  She ruled out for myocardial infarction.  A Myoview stress test was read as low risk with mild inferior ischemia.  She also had lower extremity arterial Doppler studies performed in our office 03/31/2020 which revealed a right ABI of 0.39 with an occluded right popliteal artery and a left of 1.12.  I cleared her for her left total hip replacement low risk given her recent Myoview stress test results.  She does not really complain of lifestyle limiting claudication on the right. ?  ?Unfortunately her left total hip replacement was delayed because of COVID-19 and i that was successfully performed in December of last year..  She had a  low risk Myoview stress test.  She had a right total knee replacement back in 2019.  She denies chest pain or shortness of breath. ? ?Since I saw her 6 months ago she has come planing of some occasional chest pain but had a Myoview stress test performed 02/08/2022 that was low risk and  nonischemic.  She also ambulates with some difficulty.  She was complaining of some lower extreme edema and saw Monica Presume, PA-C who changed her hydralazine to hydrochlorothiazide.  She has 1+ edema on exam today. ?  ? ? ?Current Meds  ?Medication Sig  ? acetaminophen (TYLENOL) 650 MG CR tablet Take 650 mg by mouth every 8 (eight) hours as needed for pain.  ? acetaminophen-codeine (TYLENOL #3) 300-30 MG tablet Take 1-2 tablets by mouth every 8 (eight) hours as needed.  ? carvedilol (COREG) 6.25 MG tablet TAKE 1 TABLET BY MOUTH TWICE A DAY  ? Cholecalciferol (VITAMIN D3) 10 MCG (400 UNIT) tablet Take 400 Units by mouth daily.  ? clopidogrel (PLAVIX) 75 MG tablet Take 75 mg by mouth daily.  ? Coenzyme Q10 (CO Q 10 PO) Take 1 tablet by mouth every other day.   ? ezetimibe (ZETIA) 10 MG tablet Take 10 mg by mouth at bedtime.  ? folic acid (FOLVITE) 1 MG tablet Take 1 mg by mouth daily.  ? gabapentin (NEURONTIN) 400 MG capsule Take 400 mg by mouth 3 (three) times daily.  ? glimepiride (AMARYL) 2 MG tablet Take 2 mg by mouth daily as needed.  ? glimepiride (AMARYL) 4 MG tablet Take 4 mg by mouth daily with breakfast.  ? hydrochlorothiazide (MICROZIDE) 12.5 MG capsule Take 1 capsule (12.5 mg total) by mouth daily.  ? isosorbide mononitrate (IMDUR) 60 MG 24 hr tablet TAKE 1.5 TABLETS (90 MG TOTAL) BY MOUTH DAILY. . (Patient taking differently: Take 60 mg by mouth daily. Marland Kitchen)  ? LORazepam (ATIVAN) 0.5 MG tablet Take 0.5 mg by mouth daily as needed.  ? methotrexate (RHEUMATREX) 2.5 MG tablet Take 20 mg by mouth every Sunday. (8 tablets)  ? MULTIPLE VITAMIN-FOLIC ACID PO   ? nitroGLYCERIN (NITROSTAT) 0.4 MG SL tablet PLACE 1 TABLET (0.4 MG TOTAL) UNDER THE TONGUE EVERY 5 (FIVE) MINUTES AS NEEDED FOR CHEST PAIN.  ? pantoprazole (PROTONIX) 40 MG tablet Take 1 tablet (40 mg total) by mouth 2 (two) times daily before a meal.  ? predniSONE (DELTASONE) 5 MG tablet Take 5 mg by mouth daily with breakfast.   ? rosuvastatin  (CRESTOR) 40 MG tablet TAKE 1 TABLET BY MOUTH EVERYDAY AT BEDTIME  ? sulfaSALAzine (AZULFIDINE) 500 MG EC tablet Take 500 mg by mouth 2 (two) times daily.  ?  ? ?Allergies  ?Allergen Reactions  ? Fish Allergy Hives and Shortness Of Breath  ? Iodinated Contrast Media Shortness Of Breath and Anaphylaxis  ?  Other reaction(s): Unknown  ? Latex Anaphylaxis, Hives, Shortness Of Breath, Itching and Other (See Comments)  ?  REACTION: wheezing ?Other reaction(s): Unknown  ? Shellfish Allergy Anaphylaxis, Hives, Shortness Of Breath and Other (See Comments)  ?  All seafood  ? Amlodipine Swelling  ? Evolocumab   ?  Other reaction(s): latex on syringe  ? Other Itching and Other (See Comments)  ?  Patient reports allergy to perfumed detergents. Causes itching.  ? Metformin Nausea Only and Nausea And Vomiting  ?  Other reaction(s): Unknown  ? ? ?Social History  ? ?Socioeconomic History  ? Marital status: Married  ?  Spouse name: Not on  file  ? Number of children: Not on file  ? Years of education: Not on file  ? Highest education level: Not on file  ?Occupational History  ? Occupation: retired  ?Tobacco Use  ? Smoking status: Never  ? Smokeless tobacco: Never  ?Vaping Use  ? Vaping Use: Never used  ?Substance and Sexual Activity  ? Alcohol use: Not Currently  ?  Comment: rare glass of wine   ? Drug use: No  ? Sexual activity: Not on file  ?Other Topics Concern  ? Not on file  ?Social History Narrative  ? Right handed  ? Lives alone ( husband in rehab facility)  ? Caffeine- mostly coffee   ?   ? 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  ? ?Social Determinants of Health  ? ?Financial Resource Strain: Not on file  ?Food Insecurity: Not on file  ?Transportation Needs: Not on file  ?Physical Activity: Not on file  ?Stress: Not on file  ?Social Connections: Not on file  ?Intimate Partner Violence: Not on file  ?  ? ?Review of Systems: ?General: negative for chills, fever, night sweats or weight changes.  ?Cardiovascular: negative for chest pain, dyspnea on  exertion, edema, orthopnea, palpitations, paroxysmal nocturnal dyspnea or shortness of breath ?Dermatological: negative for rash ?Respiratory: negative for cough or wheezing ?Urologic: negative for hematuria ?Abdominal

## 2022-03-02 NOTE — Assessment & Plan Note (Signed)
History of dyslipidemia on Zetia and high-dose Crestor with lipid profile performed 02/16/2022 revealing total cholesterol 153, LDL of 89 and HDL 53. ?

## 2022-03-02 NOTE — Assessment & Plan Note (Signed)
History of CAD status post coronary artery bypass grafting in 1998.  She recently had a Myoview stress test performed 02/08/2022 which was low risk and nonischemic with good EF.  She gets rare chest pain. ?

## 2022-03-02 NOTE — Patient Instructions (Signed)
Medication Instructions:  ?Your physician recommends that you continue on your current medications as directed. Please refer to the Current Medication list given to you today. ? ?*If you need a refill on your cardiac medications before your next appointment, please call your pharmacy* ? ? ?Follow-Up: ?At [facility], you and your health needs are our priority.  As part of our continuing mission to provide you with exceptional heart care, we have created designated Provider Care Teams.  These Care Teams include your primary Cardiologist (physician) and Advanced Practice Providers (APPs -  Physician Assistants and Nurse Practitioners) who all work together to provide you with the care you need, when you need it. ? ?We recommend signing up for the patient portal called "MyChart".  Sign up information is provided on this After Visit Summary.  MyChart is used to connect with patients for Virtual Visits (Telemedicine).  Patients are able to view lab/test results, encounter notes, upcoming appointments, etc.  Non-urgent messages can be sent to your provider as well.   ?To learn more about what you can do with MyChart, go to [email or web address].   ? ?Your next appointment:   ?3 month(s) ? ?The format for your next appointment:   ?In Person ? ?Provider:   ?Caron Presume, PA-C     ? ? ?Then, Quay Burow, MD will plan to see you again in 12 month(s).  ?

## 2022-03-02 NOTE — Assessment & Plan Note (Signed)
History of essential hypertension blood pressure measured today at 182/84.  She recently saw Caron Presume who discontinue her hydralazine and began her on hydrochlorothiazide because of lower extremity edema.  She is also on carvedilol. ?

## 2022-03-02 NOTE — Assessment & Plan Note (Signed)
History of PAD status post intervention of her distal right SFA by myself back in 2014.  I ultimately sent her to Dr. Brunetta Jeans who did complex popliteal and tibioperoneal intervention 10/01/2013.  She has not had intervention since and does complain of some claudication.  Her last Doppler study performed 03/31/2020 revealed a right ABI of 0.39 and a left of 1.12.  She appears to have an occluded right popliteal artery and patent left popliteal stent. ?

## 2022-03-08 ENCOUNTER — Ambulatory Visit (INDEPENDENT_AMBULATORY_CARE_PROVIDER_SITE_OTHER): Payer: Medicare Other

## 2022-03-08 ENCOUNTER — Ambulatory Visit: Payer: Medicare Other | Admitting: Orthopaedic Surgery

## 2022-03-08 DIAGNOSIS — M7632 Iliotibial band syndrome, left leg: Secondary | ICD-10-CM | POA: Diagnosis not present

## 2022-03-08 DIAGNOSIS — Z96642 Presence of left artificial hip joint: Secondary | ICD-10-CM | POA: Diagnosis not present

## 2022-03-08 NOTE — Progress Notes (Signed)
The patient is well-known to me.  She is 88 years old and has a history of both her hips being replaced.  Her left hip was more recently replaced in December 2021.  She does ambulate using a cane.  She has been reporting thigh pain and she points to actually the lateral aspect of her IT band close to the left knee but also proximal. ? ?I am able to easily move both her hips and knees without difficulty.  She has significant tenderness over the IT band on the lateral aspect of the distal third of the thigh and the IT band. ? ?An AP pelvis and lateral left hip shows well-seated bilateral total hip arthroplasties with no complicating features. ? ?She does have significant inflammation along her IT band.  I recommended she get Voltaren gel and rub this over this area 2-3 times a day.  This will help.  Also would like to send her to outpatient physical therapy with any modalities that the therapist can do to help decrease the pain in this area of her left IT band and.  I will see her back in 6 weeks after course of therapy.  All questions and concerns were answered and addressed.  She agrees with this treatment plan. ?

## 2022-03-09 ENCOUNTER — Other Ambulatory Visit: Payer: Self-pay

## 2022-03-09 DIAGNOSIS — M7632 Iliotibial band syndrome, left leg: Secondary | ICD-10-CM

## 2022-03-17 ENCOUNTER — Ambulatory Visit: Payer: Medicare Other | Admitting: Podiatry

## 2022-03-17 DIAGNOSIS — M79675 Pain in left toe(s): Secondary | ICD-10-CM

## 2022-03-17 DIAGNOSIS — B351 Tinea unguium: Secondary | ICD-10-CM

## 2022-03-17 DIAGNOSIS — E1151 Type 2 diabetes mellitus with diabetic peripheral angiopathy without gangrene: Secondary | ICD-10-CM | POA: Diagnosis not present

## 2022-03-17 DIAGNOSIS — M79674 Pain in right toe(s): Secondary | ICD-10-CM | POA: Diagnosis not present

## 2022-03-17 DIAGNOSIS — I739 Peripheral vascular disease, unspecified: Secondary | ICD-10-CM

## 2022-03-17 NOTE — Progress Notes (Signed)
This patient returns to my office for at risk foot care.  This patient requires this care by a professional since this patient will be at risk due to having diabetes,  PAD  and coagulation defect.  Patient is taking plavix. Patient says she has painful long thick nails especially her big toenail right foot.   This patient is unable to cut nails herself since the patient cannot reach her nails.These nails are painful walking and wearing shoes.  This patient presents for at risk foot care today. ? ?General Appearance  Alert, conversant and in no acute stress. ? ?Vascular  Dorsalis pedis and posterior tibial  pulses are not  palpable  bilaterally. Absent digital hair  B/L.  Capillary return is within normal limits  bilaterally. Temperature is within normal limits  bilaterally. Venous stasis  B/l. ? ?Neurologic  Senn-Weinstein monofilament wire test within normal limits  bilaterally. Muscle power within normal limits bilaterally. ? ?Nails Thick disfigured discolored nails with subungual debris  from hallux to fifth toes bilaterally. No evidence of bacterial infection or drainage bilaterally. ? ?Orthopedic  No limitations of motion  feet .  No crepitus or effusions noted.  No bony pathology or digital deformities noted. ? ?Skin  normotropic skin with no porokeratosis noted bilaterally.  No signs of infections or ulcers noted.    ? ?Onychomycosis  Pain in right toes  Pain in left toes   ? ?Consent was obtained for treatment procedures.   Mechanical debridement of nails 1-5  bilaterally performed with a nail nipper.  Filed with dremel without incident.  ? ? ?Return office visit    3 months                 Told patient to return for periodic foot care and evaluation due to potential at risk complications. ? ? ?Gardiner Barefoot DPM  ?

## 2022-03-29 ENCOUNTER — Ambulatory Visit: Payer: Medicare Other | Admitting: Physical Therapy

## 2022-04-05 ENCOUNTER — Ambulatory Visit: Payer: Medicare Other | Admitting: Physical Therapy

## 2022-04-05 NOTE — Therapy (Deleted)
OUTPATIENT PHYSICAL THERAPY LOWER EXTREMITY EVALUATION   Patient Name: [name] MRN: [id number] DOB:08-22-1933, 88 y.o., female Today's Date: 04/05/2022    Past Medical History:  Diagnosis Date   Allergy to IVP dye    Angina, class I ([place]) 04/22/2015   Atherosclerotic heart disease native coronary artery w/angina pectoris ([place]) 1992   a. 1992 s/p PTCA of LAD;  b. 1998 CABG x 3; c. 07/2001 Cath: Sev LM/LAD/LCX dzs, 3/3 patent grafts; d. 11/2013 Cath: stable graft anatomy; e. 10/2016 Cath: native 3VD, VG->OM nl, LIMA->LAD nl, VG->Diag 55.   Atherosclerotic heart disease of native coronary artery with angina pectoris ([place]) 01/14/2010   Qualifier: Diagnosis of  By: Deatra Ina MD, Sandy Salaam    Atrial septal aneurysm    Barrett's esophagus 03/04/2010   Qualifier: Diagnosis of  By: Shane Crutch, Amy S    Bilateral leg edema 04/22/2015   Bradycardia, mild briefly to the 40s, asymptomatic 05/16/2013   Carotid arterial disease ([place])    a. 05/2014 Carotid U/S: No signif bilat ICA stenosis, >60% L ECA.   Chronic anemia    Chronic diastolic CHF (congestive heart failure) ([place])    a. 10/2016 Echo: Ef 55-60%, no rwma, Gr1 DD, triv AI, PASP 37mHg, atrial septal aneurysm.   Chronic diastolic CHF (congestive heart failure), NYHA class 2 (HLake [place]Ho 08/11/2017   Claudication (HFair[place] 05/16/2013   Peripheral arterial occlusive disease with lifestyle limiting claudication    Degenerative joint disease 05/30/2018   DIVERTICULOSIS OF COLON 01/14/2010   Qualifier: Diagnosis of  By: KDeatra InaMD, RSandy Salaam   DM (diabetes mellitus), type 2 with peripheral vascular complications (HH[place]    On Oral medications.   DOE (dyspnea on exertion) 04/22/2015   Essential hypertension    FECAL INCONTINENCE 03/04/2010   Qualifier: Diagnosis of  By: ETrellis PaganiniPA-c, Amy S    GERD (gastroesophageal reflux disease)    Hyperlipidemia with target LDL less than 70    Inflammatory arthritis 10/13/2017   Neuropathy    Non-insulin  dependent type 2 diabetes mellitus (HLake Shore 10/24/2015   NSTEMI (non-ST elevated myocardial infarction) (HLusk 11/01/2016   PAD (peripheral artery disease) (HWest [place] 05/2013; 09/2013   a. severe calcified POP-1 & TP Trunk Dz bilat;  b . 05/2013 Diamondback Rot Athrectomy (R POP) --> PTA R Pop, DES to R Peroneal;  c. 09/2013 PTA/Stenting of L Pop ([name]- retrograde access);  d. 04/2014 ABI's: R/L: 1.1/1.1.   Preoperative cardiovascular examination 04/15/2020   PUD (peptic ulcer disease)    Rheumatoid arthritis ([place]Stuarts [place]    S/P CABG x 3 1998   LIMA-LAD, SVG-OM, SVG-D1   Severe claudication ([place]St. Reg[place] 05/2013   Referred for Peripheral Angio (Dr. BGwenlyn Found--> Dr. GBrunetta Jeansin CFort Thompson   SBrooker02/01/2010   Qualifier: Diagnosis of  By: NHarlon DittyCMA (AFort Lupton, [name]     Status post total replacement of right hip 10/12/2018   Vertigo 01/02/2014   Past Surgical History:  Procedure Laterality Date   ATHERECTOMY Right 05/15/2013   Procedure: ATHERECTOMY;  Surgeon: JLorretta Harp MD;  [facility];  Service: Cardiovascular;  Laterality: Right;  popliteal   BACK SURGERY     CARDIAC CATHETERIZATION  07/17/01   2 v CAD with LM, circ, LAD, obtuse diag, mod stenosis of diag vein graft , mild stenosis of marg vein graft, nl EF, medical treatment   CARDIAC CATHETERIZATION  11/2013   Widely patent LIMA-LAD & SVG-OM with mild progression of proximal stenosis ~40-50% in SVG-D1; Widely  patent native RCA with known severe native LCA disease   CARDIAC CATHETERIZATION N/A 11/01/2016   Procedure: Left Heart Cath and Cors/Grafts Angiography;  Surgeon: [name], MD;  Location: [place] CV LAB;  Service: Cardiovascular: Hemodynamics: High LVEDP!.  Cors/Grafts: LM 55%, o-p LAD 100% then after D1 -> LIMA-LAD patent, SVG-Diag ~55%. small RI wiht ~70% ostial. O-p Cx 80% - pCx 70% --> SVG-bifurcating OM patent. -- med Rx   CORONARY ANGIOPLASTY  1992   PCI to LAD   CORONARY ARTERY BYPASS GRAFT  1998    LIMA-LAD, SVG-diagonal (none roughly 50% stenosis), SVG-OM   LEFT HEART CATHETERIZATION WITH CORONARY ANGIOGRAM N/A 11/27/2013   Procedure: LEFT HEART CATHETERIZATION WITH CORONARY ANGIOGRAM;  Surgeon: [name], MD;  Location: [facility] CATH LAB;  Service: Cardiovascular;  Laterality: N/A;   LOW EXTREMITY DOPPLERS/ABI  10/2016   Patent right SFA, popliteal and peroneal tendons. Patent left SFA and popliteal stent. 30-50% stenosis in the right common femoral and popliteal arteries, 50-74% stenosis in left profunda femoris artery. --> 1 year follow-up.  RABI - 1.2, LABI 1.1   LOWER EXTREMITY ANGIOGRAM N/A 02/22/2013   Procedure: LOWER EXTREMITY ANGIOGRAM;  Surgeon: Lorretta Harp, MD;  [facility];  Service: Cardiovascular;  Laterality: N/A;   LOWER EXTREMITY ANGIOGRAM N/A 07/20/2013   Procedure: LOWER EXTREMITY ANGIOGRAM;  Surgeon: Lorretta Harp, MD;  Location: [facility] CATH LAB;  Service: Cardiovascular;  Laterality: N/A;   NM MYOVIEW LTD  04/03/2013; 5/26'/2016   Lexiscan: a)  Apical perfusion defect with no ischemia. Consider prior infarct versus breast attenuation.;; b) 5/'16: LOW RISK, Nl EF ~55-65%, No Ischemia /Infarction   NM MYOVIEW LTD  10/'19; 4/'21   a) LOW RISK.  EF 55%.  No ischemia or infarction. b) EF estimated 81%.  Suboptimal images.  May be subtle inferior ischemia-initially read by radiology as intermediate risk, however overread by cardiology to be mild low risk.Marland Kitchen   PERCUTANEOUS STENT INTERVENTION Right 05/15/2013   Procedure: PERCUTANEOUS STENT INTERVENTION;  Surgeon: Lorretta Harp, MD;  [facility] LAB;  Service: Cardiovascular;  Laterality: Right;  tibioperoneal trunk   Peroneal Artery Stent  05/15/13   Diamondback rot. atherectomy of high grade segmental popliteal stenosis then Chocolate balloonPTA; then angiosculpt PTA of the prox peroneal with stent-Xpedition    pv angio  07/20/13   high-grade calcified popliteal disease with one-vessel runoff via a small anterior  tibial artery that has proximal disease as well, no intervention   TOTAL HIP ARTHROPLASTY Right 10/12/2018   Procedure: RIGHT TOTAL HIP ARTHROPLASTY ANTERIOR APPROACH;  Surgeon: Mcarthur Rossetti, MD;  [facility];  Service: Orthopedics;  Laterality: Right;   TOTAL HIP ARTHROPLASTY Left 11/21/2020   Procedure: LEFT TOTAL HIP ARTHROPLASTY ANTERIOR APPROACH;  Surgeon: Mcarthur Rossetti, MD;  [facility];  Service: Orthopedics;  Laterality: Left;   TRANSTHORACIC ECHOCARDIOGRAM  11/'17; 10/19   a) EF 55-60%. Gr 1 DD. Normal PAP. Aortic Sclerosis.;; b) Normal LV size and function with vigorous EF 65 to 70%.  GR 1 DD.  Severe LA dilation (suggestive of probably worsening: GR 1 DD).   TRANSTHORACIC ECHOCARDIOGRAM  03/18/2020   EF 70-75%.  Hyperdynamic.  GR 1 DD.  No R WMA.  Mild LA dilation.  Mild aortic valve sclerosis but no stenosis.-No change from prior echo   TUBAL LIGATION     Patient Active Problem List   Diagnosis Date Noted   Anxiety 11/04/2021   Avascular necrosis ([place]) 11/04/2021  Chronic interstitial cystitis 11/04/2021   Diabetic peripheral neuropathy associated with type 2 diabetes mellitus ([place]) 11/04/2021   Polyneuropathy due to type 2 diabetes mellitus ([place]) 11/04/2021   Eczema 11/04/2021   History of urinary tract infection 11/04/2021   Neuropathy 11/04/2021   Other long term (current) drug therapy 11/04/2021   Peptic ulcer disease 11/04/2021   Presence of aortocoronary bypass graft 11/04/2021   Skin sensation disturbance 11/04/2021   Vitamin D deficiency 11/04/2021   Gastroesophageal reflux disease 11/04/2021   Rheumatoid arthritis ([place]) 11/04/2021   Peripheral vascular disease ([place]) 11/04/2021   Type 2 diabetes mellitus with other specified complication ([place]) 22/01/5426   Chronic diastolic heart failure ([place]) 01/07/2021   Status post total hip replacement, right 12/04/2020   Status post total replacement of left hip 11/21/2020   Near syncope  07/10/2020   Hx of CABG 07/10/2020   Preoperative cardiovascular examination 04/15/2020   Chest pain 03/18/2020   Unilateral primary osteoarthritis, left hip 04/03/2019   History of right hip replacement 04/03/2019   Status post total replacement of right hip 10/12/2018   Unilateral primary osteoarthritis, right hip 09/12/2018   Degenerative joint disease 05/30/2018   Anal fissure 11/10/2017   Rectal bleeding 11/10/2017   Rectal pain 11/10/2017   Inflammatory arthritis 10/13/2017   Latent tuberculosis by blood test 10/13/2017   Chronic diastolic CHF (congestive heart failure), NYHA class 2 ([place]) 08/11/2017   Fatigue due to treatment 01/09/2017   NSTEMI (non-ST elevated myocardial infarction) ([place]) 11/01/2016   Diabetes mellitus ([place]) 10/24/2015   Non-insulin dependent type 2 diabetes mellitus ([place]) 10/24/2015   Angina, class I ([place]) 04/22/2015   DOE (dyspnea on exertion) 04/22/2015   Bilateral leg edema 04/22/2015   Accumulation of fluid in tissues 03/21/2014   Vertigo, central 01/02/2014   Vertigo 01/02/2014   Limb pain- echymosis, pain Lt radial artery after cath 12/11/2013   Dyslipidemia, goal LDL below 70 05/17/2013   Essential hypertension 05/17/2013   Claudication ([place]) 05/16/2013   PTA/ Stent Rt popliteal and Rt peroneal artery 05/16/13 05/16/2013   PAD,severe calcified pop and tibial peroneal trunk disease bilateraly    ABDOMINAL PAIN RIGHT LOWER QUADRANT 10/07/2010   CVA-STROKE 03/04/2010   BARRETT'S ESOPHAGUS 03/04/2010   FECAL INCONTINENCE 03/04/2010   DM 01/14/2010   Anemia of chronic disease 01/14/2010   Atherosclerotic heart disease of native coronary artery with angina pectoris ([place]) 01/14/2010   DIVERTICULOSIS OF COLON 01/14/2010   PERSONAL HISTORY OF COLONIC POLYPS 01/14/2010   SHELLFISH ALLERGY 01/14/2010    PCP: Janie Morning, DO  REFERRING PROVIDER: Mcarthur Rossetti*  REFERRING DIAG: C62.37 (ICD-10-CM) - It band syndrome, left  THERAPY DIAG:   No diagnosis found.  ONSET DATE: ***  SUBJECTIVE:   SUBJECTIVE STATEMENT: ***  PERTINENT HISTORY:  CAD s/p CABG, CHF, PAD with claudication, DM with neuropathy, HTN, HLD, NSTEMI, RA, bil THA, hx back surgery  PAIN:  Are you having pain? {OPRCPAIN:27236}  PRECAUTIONS: None  WEIGHT BEARING RESTRICTIONS No  FALLS:  Has patient fallen in last 6 months? {fallsyesno:27318}  LIVING ENVIRONMENT: Lives with: {OPRC lives with:25569::"lives with their family"} Lives in: {Lives in:25570} Stairs: {opstairs:27293} Has following equipment at home: {Assistive devices:23999}  OCCUPATION: ***  PLOF: {PLOF:24004}  PATIENT GOALS ***   OBJECTIVE:   DIAGNOSTIC FINDINGS: ***  PATIENT SURVEYS:  {rehab surveys:24030}  COGNITION:  Overall cognitive status: {cognition:24006}     SENSATION: {sensation:27233}  MUSCLE LENGTH: Hamstrings: Right *** deg; Left *** deg Thomas test: Right *** deg; Left ***  deg  POSTURE:  ***  PALPATION: ***  LE ROM:  {AROM/PROM:27142} ROM Right 04/05/2022 Left 04/05/2022  Hip flexion    Hip extension    Hip abduction    Hip adduction    Hip internal rotation    Hip external rotation    Knee flexion    Knee extension    Ankle dorsiflexion    Ankle plantarflexion    Ankle inversion    Ankle eversion     (Blank rows = not tested)  LE MMT:  MMT Right 04/05/2022 Left 04/05/2022  Hip flexion    Hip extension    Hip abduction    Hip adduction    Hip internal rotation    Hip external rotation    Knee flexion    Knee extension    Ankle dorsiflexion    Ankle plantarflexion    Ankle inversion    Ankle eversion     (Blank rows = not tested)  LOWER EXTREMITY SPECIAL TESTS:  {LEspecialtests:26242}  FUNCTIONAL TESTS:  {Functional tests:24029}  GAIT: Distance walked: *** Assistive device utilized: {Assistive devices:23999} Level of assistance: {Levels of assistance:24026} Comments: ***    TODAY'S TREATMENT: HEP  instruction/performance c cues for techniques, handout provided.  Trial set performed of each for comprehension and symptom assessment.  See below for exercise list.   PATIENT EDUCATION:  Education details: Pt POC, HEP Person educated: Patient Education method: Explanation, Demonstration, Tactile cues, Verbal cues, and Handouts Education comprehension: verbalized understanding and returned demonstration   HOME EXERCISE PROGRAM: ***  ASSESSMENT:  CLINICAL IMPRESSION: Patient is a 88 y.o. who comes to clinic with complaints of left IT band and left hip/thigh pain.  pain with mobility, strength and movement coordination deficits that impair their ability to perform usual daily and recreational functional activities without increase difficulty/symptoms at this time.  Patient to benefit from skilled PT services to address impairments and limitations to improve to previous level of function without restriction secondary to condition.   OBJECTIVE IMPAIRMENTS decreased activity tolerance, decreased balance, decreased endurance, decreased mobility, difficulty walking, decreased ROM, decreased strength, increased edema, impaired flexibility, postural dysfunction, and pain.   ACTIVITY LIMITATIONS community activity.   PERSONAL FACTORS  CAD s/p CABG, CHF, PAD with claudication, DM with neuropathy, HTN, HLD, NSTEMI, RA, bil THA, hx back surgery are also affecting patient's functional outcome.    REHAB POTENTIAL: {rehabpotential:25112}  CLINICAL DECISION MAKING: Stable/uncomplicated  EVALUATION COMPLEXITY: Low   GOALS: Goals reviewed with patient? Yes  Short term PT Goals (target date for Short term goals are 3 weeks 04/30/2022) Patient will demonstrate independent use of home exercise program to maintain progress from in clinic treatments. Goal status: New   Long term PT goals (target dates for all long term goals are 6 weeks  05/21/2022 ) Patient will demonstrate/report pain at worst less  than or equal to 2/10 to facilitate minimal limitation in daily activity secondary to pain symptoms. Goal status: New  Patient will demonstrate independent use of home exercise program to facilitate ability to maintain/progress functional gains from skilled physical therapy services. Goal status: New  Patient will demonstrate FOTO outcome > or = *** % to indicate reduced disability due to condition. Goal status: New  *** Goal status: New      5.  *** Goal status: New   PLAN: PT FREQUENCY: 1-2x/week  PT DURATION: 6 weeks  PLANNED INTERVENTIONS: Therapeutic exercises, Therapeutic activity, Neuromuscular re-education, Balance training, Gait training, Patient/Family education, Joint manipulation, Joint mobilization, Stair training, Dry  Needling, Electrical stimulation, Cryotherapy, Moist heat, Ultrasound, Ionotophoresis '4mg'$ /ml Dexamethasone, and Manual therapy  PLAN FOR NEXT SESSION: review HEP, LE strengthening, stretching, Nustep   Oretha Caprice, PT, MPT 04/05/2022, 9:28 AM

## 2022-04-06 ENCOUNTER — Encounter: Payer: Self-pay | Admitting: Rehabilitative and Restorative Service Providers"

## 2022-04-06 ENCOUNTER — Other Ambulatory Visit: Payer: Self-pay

## 2022-04-06 ENCOUNTER — Ambulatory Visit: Payer: Medicare Other | Admitting: Rehabilitative and Restorative Service Providers"

## 2022-04-06 DIAGNOSIS — M5459 Other low back pain: Secondary | ICD-10-CM

## 2022-04-06 DIAGNOSIS — M25551 Pain in right hip: Secondary | ICD-10-CM | POA: Diagnosis not present

## 2022-04-06 DIAGNOSIS — M79605 Pain in left leg: Secondary | ICD-10-CM

## 2022-04-06 DIAGNOSIS — R262 Difficulty in walking, not elsewhere classified: Secondary | ICD-10-CM

## 2022-04-06 DIAGNOSIS — M6281 Muscle weakness (generalized): Secondary | ICD-10-CM

## 2022-04-06 DIAGNOSIS — M25552 Pain in left hip: Secondary | ICD-10-CM

## 2022-04-06 NOTE — Therapy (Signed)
?OUTPATIENT PHYSICAL THERAPY EVALUATION ? ? ?Patient Name: [name] ?MRN: [id number] ?DOB:1933-09-20, 88 y.o., female ?Today's Date: 04/06/2022 ? ? PT End of Session - 04/06/22 1346   ? ? Visit Number 1   ? Number of Visits 20   ? Date for PT Re-Evaluation 06/15/22   ? Authorization Type [employer] Medicare 20% coinsurance   ? PT Start Time 1350   ? PT Stop Time 1425   ? PT Time Calculation (min) 35 min   ? Activity Tolerance Patient limited by pain   ? Behavior During Therapy [facility] for tasks assessed/performed   ? ?  ?  ? ?  ? ? ?Past Medical History:  ?Diagnosis Date  ? Allergy to IVP dye   ? Angina, class I ([place]) 04/22/2015  ? Atherosclerotic heart disease native coronary artery w/angina pectoris ([place]) 1992  ? a. 1992 s/p PTCA of LAD;  b. 1998 CABG x 3; c. 07/2001 Cath: Sev LM/LAD/LCX dzs, 3/3 patent grafts; d. 11/2013 Cath: stable graft anatomy; e. 10/2016 Cath: native 3VD, VG->OM nl, LIMA->LAD nl, VG->Diag 55.  ? Atherosclerotic heart disease of native coronary artery with angina pectoris ([place]) 01/14/2010  ? Qualifier: Diagnosis of  By: Deatra Ina MD, Sandy Salaam   ? Atrial septal aneurysm   ? Barrett's esophagus 03/04/2010  ? Qualifier: Diagnosis of  By: Shane Crutch, Amy S   ? Bilateral leg edema 04/22/2015  ? Bradycardia, mild briefly to the 40s, asymptomatic 05/16/2013  ? Carotid arterial disease ([place])   ? a. 05/2014 Carotid U/S: No signif bilat ICA stenosis, >60% L ECA.  ? Chronic anemia   ? Chronic diastolic CHF (congestive heart failure) ([place])   ? a. 10/2016 Echo: Ef 55-60%, no rwma, Gr1 DD, triv AI, PASP 44mHg, atrial septal aneurysm.  ? Chronic diastolic CHF (congestive heart failure), NYHA class 2 ([place] 08/11/2017  ? Claudication ([place] 05/16/2013  ? Peripheral arterial occlusive disease with lifestyle limiting claudication   ? Degenerative joint disease 05/30/2018  ? DIVERTICULOSIS OF COLON 01/14/2010  ? Qualifier: Diagnosis of  By: KDeatra InaMD, RSandy Salaam  ? DM (diabetes mellitus), type 2 with peripheral vascular  complications (HGoodh[place]   ? On Oral medications.  ? DOE (dyspnea on exertion) 04/22/2015  ? Essential hypertension   ? FECAL INCONTINENCE 03/04/2010  ? Qualifier: Diagnosis of  By: EShane Crutch Amy S   ? GERD (gastroesophageal reflux disease)   ? Hyperlipidemia with target LDL less than 70   ? Inflammatory arthritis 10/13/2017  ? Neuropathy   ? Non-insulin dependent type 2 diabetes mellitus ([place] 10/24/2015  ? NSTEMI (non-ST elevated myocardial infarction) (HDeLisle 11/01/2016  ? PAD (peripheral artery disease) (HGlen[place] 05/2013; 09/2013  ? a. severe calcified POP-1 & TP Trunk Dz bilat;  b . 05/2013 Diamondback Rot Athrectomy (R POP) --> PTA R Pop, DES to R Peroneal;  c. 09/2013 PTA/Stenting of L Pop ([name]- retrograde access);  d. 04/2014 ABI's: R/L: 1.1/1.1.  ? Preoperative cardiovascular examination 04/15/2020  ? PUD (peptic ulcer disease)   ? Rheumatoid arthritis (HNettle [place]   ? S/P CABG x 3 1998  ? LIMA-LAD, SVG-OM, SVG-D1  ? Severe claudication (HWashburn 05/2013  ? Referred for Peripheral Angio (Dr. BGwenlyn Found--> Dr. GBrunetta Jeansin CLiber[place]  ? [place] CityALLERGY 01/14/2010  ? Qualifier: Diagnosis of  By: Nelson-Smith CMA (AAMA), [name]    ? Status post total replacement of right hip 10/12/2018  ? Vertigo 01/02/2014  ? ?Past Surgical History:  ?Procedure Laterality Date  ?  ATHERECTOMY Right 05/15/2013  ? Procedure: ATHERECTOMY;  Surgeon: Lorretta Harp, MD;  Location: [facility] CATH LAB;  Service: Cardiovascular;  Laterality: Right;  popliteal  ? BACK SURGERY    ? CARDIAC CATHETERIZATION  07/17/01  ? 2 v CAD with LM, circ, LAD, obtuse diag, mod stenosis of diag vein graft , mild stenosis of marg vein graft, nl EF, medical treatment  ? CARDIAC CATHETERIZATION  11/2013  ? Widely patent LIMA-LAD & SVG-OM with mild progression of proximal stenosis ~40-50% in SVG-D1; Widely patent native RCA with known severe native LCA disease  ? CARDIAC CATHETERIZATION N/A 11/01/2016  ? Procedure: Left Heart Cath and Cors/Grafts Angiography;  Surgeon: [name], MD;  Location: [place] CV LAB;  Service: Cardiovascular: Hemodynamics: High LVEDP!.  Cors/Grafts: LM 55%, o-p LAD 100% then after D1 -> LIMA-LAD patent, SVG-Diag ~55%. small RI wiht ~70% ostial. O-p Cx 80% - pCx 70% --> SVG-bifurcating OM patent. -- med Rx  ? CORONARY ANGIOPLASTY  1992  ? PCI to LAD  ? CORONARY ARTERY BYPASS GRAFT  1998  ? LIMA-LAD, SVG-diagonal (none roughly 50% stenosis), SVG-OM  ? LEFT HEART CATHETERIZATION WITH CORONARY ANGIOGRAM N/A 11/27/2013  ? Procedure: LEFT HEART CATHETERIZATION WITH CORONARY ANGIOGRAM;  Surgeon: [name], MD;  Location: [facility] CATH LAB;  Service: Cardiovascular;  Laterality: N/A;  ? LOW EXTREMITY DOPPLERS/ABI  10/2016  ? Patent right SFA, popliteal and peroneal tendons. Patent left SFA and popliteal stent. 30-50% stenosis in the right common femoral and popliteal arteries, 50-74% stenosis in left profunda femoris artery. --> 1 year follow-up.  RABI - 1.2, LABI 1.1  ? LOWER EXTREMITY ANGIOGRAM N/A 02/22/2013  ? Procedure: LOWER EXTREMITY ANGIOGRAM;  Surgeon: Lorretta Harp, MD;  Location: [facility] CATH LAB;  Service: Cardiovascular;  Laterality: N/A;  ? LOWER EXTREMITY ANGIOGRAM N/A 07/20/2013  ? Procedure: LOWER EXTREMITY ANGIOGRAM;  Surgeon: Lorretta Harp, MD;  Location: [facility] CATH LAB;  Service: Cardiovascular;  Laterality: N/A;  ? NM MYOVIEW LTD  04/03/2013; 5/26'/2016  ? Lexiscan: a)  Apical perfusion defect with no ischemia. Consider prior infarct versus breast attenuation.;; b) 5/'16: LOW RISK, Nl EF ~55-65%, No Ischemia /Infarction  ? NM MYOVIEW LTD  10/'19; 4/'21  ? a) LOW RISK.  EF 55%.  No ischemia or infarction. b) EF estimated 81%.  Suboptimal images.  May be subtle inferior ischemia-initially read by radiology as intermediate risk, however overread by cardiology to be mild low risk..  ? PERCUTANEOUS STENT INTERVENTION Right 05/15/2013  ? Procedure: PERCUTANEOUS STENT INTERVENTION;  Surgeon: Lorretta Harp, MD;  Location: [facility] CATH LAB;  Service:  Cardiovascular;  Laterality: Right;  tibioperoneal trunk  ? Peroneal Artery Stent  05/15/13  ? Diamondback rot. atherectomy of high grade segmental popliteal stenosis then Chocolate balloonPTA; then angiosculpt PTA of the prox peroneal with stent-Xpedition   ? pv angio  07/20/13  ? high-grade calcified popliteal disease with one-vessel runoff via a small anterior tibial artery that has proximal disease as well, no intervention  ? TOTAL HIP ARTHROPLASTY Right 10/12/2018  ? Procedure: RIGHT TOTAL HIP ARTHROPLASTY ANTERIOR APPROACH;  Surgeon: Mcarthur Rossetti, MD;  [facility]S;  Service: Orthopedics;  Laterality: Right;  ? TOTAL HIP ARTHROPLASTY Left 11/21/2020  ? Procedure: LEFT TOTAL HIP ARTHROPLASTY ANTERIOR APPROACH;  Surgeon: Mcarthur Rossetti, MD;  [facility] ORS;  Service: Orthopedics;  Laterality: Left;  ? TRANSTHORACIC ECHOCARDIOGRAM  11/'17; 10/19  ? a) EF 55-60%. Gr 1 DD. Normal PAP. Aortic Sclerosis.;; b)  Normal LV size and function with vigorous EF 65 to 70%.  GR 1 DD.  Severe LA dilation (suggestive of probably worsening: GR 1 DD).  ? TRANSTHORACIC ECHOCARDIOGRAM  03/18/2020  ? EF 70-75%.  Hyperdynamic.  GR 1 DD.  No R WMA.  Mild LA dilation.  Mild aortic valve sclerosis but no stenosis.-No change from prior echo  ? TUBAL LIGATION    ? ?Patient Active Problem List  ? Diagnosis Date Noted  ? Anxiety 11/04/2021  ? Avascular necrosis ([place]) 11/04/2021  ? Chronic interstitial cystitis 11/04/2021  ? Diabetic peripheral neuropathy associated with type 2 diabetes mellitus ([place]) 11/04/2021  ? Polyneuropathy due to type 2 diabetes mellitus ([place]) 11/04/2021  ? Eczema 11/04/2021  ? History of urinary tract infection 11/04/2021  ? Neuropathy 11/04/2021  ? Other long term (current) drug therapy 11/04/2021  ? Peptic ulcer disease 11/04/2021  ? Presence of aortocoronary bypass graft 11/04/2021  ? Skin sensation disturbance 11/04/2021  ? Vitamin D deficiency 11/04/2021  ? Gastroesophageal reflux disease  11/04/2021  ? Rheumatoid arthritis ([place]) 11/04/2021  ? Peripheral vascular disease ([place]) 11/04/2021  ? Type 2 diabetes mellitus with other specified complication ([place]) 58/85/0277  ? Chronic diastolic heart failure

## 2022-04-10 ENCOUNTER — Other Ambulatory Visit: Payer: Self-pay | Admitting: Cardiovascular Disease

## 2022-04-19 ENCOUNTER — Encounter: Payer: Self-pay | Admitting: Rehabilitative and Restorative Service Providers"

## 2022-04-19 ENCOUNTER — Ambulatory Visit (INDEPENDENT_AMBULATORY_CARE_PROVIDER_SITE_OTHER): Payer: Medicare Other | Admitting: Rehabilitative and Restorative Service Providers"

## 2022-04-19 DIAGNOSIS — M79605 Pain in left leg: Secondary | ICD-10-CM | POA: Diagnosis not present

## 2022-04-19 DIAGNOSIS — M5459 Other low back pain: Secondary | ICD-10-CM | POA: Diagnosis not present

## 2022-04-19 DIAGNOSIS — M25552 Pain in left hip: Secondary | ICD-10-CM

## 2022-04-19 DIAGNOSIS — R262 Difficulty in walking, not elsewhere classified: Secondary | ICD-10-CM

## 2022-04-19 DIAGNOSIS — M25551 Pain in right hip: Secondary | ICD-10-CM

## 2022-04-19 DIAGNOSIS — M6281 Muscle weakness (generalized): Secondary | ICD-10-CM

## 2022-04-19 NOTE — Therapy (Addendum)
?OUTPATIENT PHYSICAL THERAPY TREATMENT NOTE ? ? ?Patient Name: [name] ?MRN: [id number] ?DOB:11-Oct-1933, 88 y.o., female ?Today's Date: 04/19/2022 ? ? 04/19/22 1417  ?PT Visits / Re-Eval  ?Visit Number 2  ?Number of Visits 20  ?Date for PT Re-Evaluation 06/15/22  ?Authorization  ?Authorization Type UHC Medicare 20% coinsurance  ?PT Time Calculation  ?PT Start Time 1411  ?PT Stop Time 1761  ?PT Time Calculation (min) 16 min  ?PT - End of Session  ?Activity Tolerance Patient limited by pain;Patient limited by fatigue  ?Behavior During Therapy WFL for tasks assessed/performed  ? ? ?END OF SESSION:  ? ? ?Past Medical History:  ?Diagnosis Date  ? Allergy to IVP dye   ? Angina, class I ([place]) 04/22/2015  ? Atherosclerotic heart disease native coronary artery w/angina pectoris ([place]) 1992  ? a. 1992 s/p PTCA of LAD;  b. 1998 CABG x 3; c. 07/2001 Cath: Sev LM/LAD/LCX dzs, 3/3 patent grafts; d. 11/2013 Cath: stable graft anatomy; e. 10/2016 Cath: native 3VD, VG->OM nl, LIMA->LAD nl, VG->Diag 55.  ? Atherosclerotic heart disease of native coronary artery with angina pectoris ([place]) 01/14/2010  ? Qualifier: Diagnosis of  By: Deatra Ina MD, Sandy Salaam   ? Atrial septal aneurysm   ? Barrett's esophagus 03/04/2010  ? Qualifier: Diagnosis of  By: Shane Crutch, Amy S   ? Bilateral leg edema 04/22/2015  ? Bradycardia, mild briefly to the 40s, asymptomatic 05/16/2013  ? Carotid arterial disease ([place])   ? a. 05/2014 Carotid U/S: No signif bilat ICA stenosis, >60% L ECA.  ? Chronic anemia   ? Chronic diastolic CHF (congestive heart failure) ([place])   ? a. 10/2016 Echo: Ef 55-60%, no rwma, Gr1 DD, triv AI, PASP 63mHg, atrial septal aneurysm.  ? Chronic diastolic CHF (congestive heart failure), NYHA class 2 ([place] 08/11/2017  ? Claudication ([place] 05/16/2013  ? Peripheral arterial occlusive disease with lifestyle limiting claudication   ? Degenerative joint disease 05/30/2018  ? DIVERTICULOSIS OF COLON 01/14/2010  ? Qualifier: Diagnosis of  By:  KDeatra InaMD, RSandy Salaam  ? DM (diabetes mellitus), type 2 with peripheral vascular complications (HW[place]   ? On Oral medications.  ? DOE (dyspnea on exertion) 04/22/2015  ? Essential hypertension   ? FECAL INCONTINENCE 03/04/2010  ? Qualifier: Diagnosis of  By: EShane Crutch Amy S   ? GERD (gastroesophageal reflux disease)   ? Hyperlipidemia with target LDL less than 70   ? Inflammatory arthritis 10/13/2017  ? Neuropathy   ? Non-insulin dependent type 2 diabetes mellitus ([place] 10/24/2015  ? NSTEMI (non-ST elevated myocardial infarction) ([place] 11/01/2016  ? PAD (peripheral artery disease) (HB[place] 05/2013; 09/2013  ? a. severe calcified POP-1 & TP Trunk Dz bilat;  b . 05/2013 Diamondback Rot Athrectomy (R POP) --> PTA R Pop, DES to R Peroneal;  c. 09/2013 PTA/Stenting of L Pop ([name]- retrograde access);  d. 04/2014 ABI's: R/L: 1.1/1.1.  ? Preoperative cardiovascular examination 04/15/2020  ? PUD (peptic ulcer disease)   ? Rheumatoid arthritis (HT[place]car[place]   ? S/P CABG x 3 1998  ? LIMA-LAD, SVG-OM, SVG-D1  ? Severe claudication (HElizabet[place] 05/2013  ? Referred for Peripheral Angio (Dr. BGwenlyn Found--> Dr. GBrunetta Jeansin CW[place]  ? [place]ER[place] 01/14/2010  ? Qualifier: Diagnosis of  By: Nelson-Smith CMA (AAMA), [name]    ? Status post total replacement of right hip 10/12/2018  ? Vertigo 01/02/2014  ? ?Past Surgical History:  ?Procedure Laterality Date  ? ATHERECTOMY Right 05/15/2013  ? Procedure:  ATHERECTOMY;  Surgeon: Lorretta Harp, MD;  Location: [facility] CATH LAB;  Service: Cardiovascular;  Laterality: Right;  popliteal  ? BACK SURGERY    ? CARDIAC CATHETERIZATION  07/17/01  ? 2 v CAD with LM, circ, LAD, obtuse diag, mod stenosis of diag vein graft , mild stenosis of marg vein graft, nl EF, medical treatment  ? CARDIAC CATHETERIZATION  11/2013  ? Widely patent LIMA-LAD & SVG-OM with mild progression of proximal stenosis ~40-50% in SVG-D1; Widely patent native RCA with known severe native LCA disease  ? CARDIAC CATHETERIZATION N/A 11/01/2016   ? Procedure: Left Heart Cath and Cors/Grafts Angiography;  Surgeon: [name], MD;  Location: [place] CV LAB;  Service: Cardiovascular: Hemodynamics: High LVEDP!.  Cors/Grafts: LM 55%, o-p LAD 100% then after D1 -> LIMA-LAD patent, SVG-Diag ~55%. small RI wiht ~70% ostial. O-p Cx 80% - pCx 70% --> SVG-bifurcating OM patent. -- med Rx  ? CORONARY ANGIOPLASTY  1992  ? PCI to LAD  ? CORONARY ARTERY BYPASS GRAFT  1998  ? LIMA-LAD, SVG-diagonal (none roughly 50% stenosis), SVG-OM  ? LEFT HEART CATHETERIZATION WITH CORONARY ANGIOGRAM N/A 11/27/2013  ? Procedure: LEFT HEART CATHETERIZATION WITH CORONARY ANGIOGRAM;  Surgeon: [name], MD;  Location: [facility] CATH LAB;  Service: Cardiovascular;  Laterality: N/A;  ? LOW EXTREMITY DOPPLERS/ABI  10/2016  ? Patent right SFA, popliteal and peroneal tendons. Patent left SFA and popliteal stent. 30-50% stenosis in the right common femoral and popliteal arteries, 50-74% stenosis in left profunda femoris artery. --> 1 year follow-up.  RABI - 1.2, LABI 1.1  ? LOWER EXTREMITY ANGIOGRAM N/A 02/22/2013  ? Procedure: LOWER EXTREMITY ANGIOGRAM;  Surgeon: Lorretta Harp, MD;  Location: [facility] CATH LAB;  Service: Cardiovascular;  Laterality: N/A;  ? LOWER EXTREMITY ANGIOGRAM N/A 07/20/2013  ? Procedure: LOWER EXTREMITY ANGIOGRAM;  Surgeon: Lorretta Harp, MD;  Location: [facility] CATH LAB;  Service: Cardiovascular;  Laterality: N/A;  ? NM MYOVIEW LTD  04/03/2013; 5/26'/2016  ? Lexiscan: a)  Apical perfusion defect with no ischemia. Consider prior infarct versus breast attenuation.;; b) 5/'16: LOW RISK, Nl EF ~55-65%, No Ischemia /Infarction  ? NM MYOVIEW LTD  10/'19; 4/'21  ? a) LOW RISK.  EF 55%.  No ischemia or infarction. b) EF estimated 81%.  Suboptimal images.  May be subtle inferior ischemia-initially read by radiology as intermediate risk, however overread by cardiology to be mild low risk..  ? PERCUTANEOUS STENT INTERVENTION Right 05/15/2013  ? Procedure: PERCUTANEOUS STENT  INTERVENTION;  Surgeon: Lorretta Harp, MD;  Location: [facility] CATH LAB;  Service: Cardiovascular;  Laterality: Right;  tibioperoneal trunk  ? Peroneal Artery Stent  05/15/13  ? Diamondback rot. atherectomy of high grade segmental popliteal stenosis then Chocolate balloonPTA; then angiosculpt PTA of the prox peroneal with stent-Xpedition   ? pv angio  07/20/13  ? high-grade calcified popliteal disease with one-vessel runoff via a small anterior tibial artery that has proximal disease as well, no intervention  ? TOTAL HIP ARTHROPLASTY Right 10/12/2018  ? Procedure: RIGHT TOTAL HIP ARTHROPLASTY ANTERIOR APPROACH;  Surgeon: Mcarthur Rossetti, MD;  Location: [facility] ORS;  Service: Orthopedics;  Laterality: Right;  ? TOTAL HIP ARTHROPLASTY Left 11/21/2020  ? Procedure: LEFT TOTAL HIP ARTHROPLASTY ANTERIOR APPROACH;  Surgeon: Mcarthur Rossetti, MD;  Location: [facility] ORS;  Service: Orthopedics;  Laterality: Left;  ? TRANSTHORACIC ECHOCARDIOGRAM  11/'17; 10/19  ? a) EF 55-60%. Gr 1 DD. Normal PAP. Aortic Sclerosis.;; b) Normal LV size and function with  vigorous EF 65 to 70%.  GR 1 DD.  Severe LA dilation (suggestive of probably worsening: GR 1 DD).  ? TRANSTHORACIC ECHOCARDIOGRAM  03/18/2020  ? EF 70-75%.  Hyperdynamic.  GR 1 DD.  No R WMA.  Mild LA dilation.  Mild aortic valve sclerosis but no stenosis.-No change from prior echo  ? TUBAL LIGATION    ? ?Patient Active Problem List  ? Diagnosis Date Noted  ? Anxiety 11/04/2021  ? Avascular necrosis ([place]) 11/04/2021  ? Chronic interstitial cystitis 11/04/2021  ? Diabetic peripheral neuropathy associated with type 2 diabetes mellitus ([place]) 11/04/2021  ? Polyneuropathy due to type 2 diabetes mellitus ([place]) 11/04/2021  ? Eczema 11/04/2021  ? History of urinary tract infection 11/04/2021  ? Neuropathy 11/04/2021  ? Other long term (current) drug therapy 11/04/2021  ? Peptic ulcer disease 11/04/2021  ? Presence of aortocoronary bypass graft 11/04/2021  ? Skin sensation disturbance  11/04/2021  ? Vitamin D deficiency 11/04/2021  ? Gastroesophageal reflux disease 11/04/2021  ? Rheumatoid arthritis ([place]) 11/04/2021  ? Peripheral vascular disease ([place]) 11/04/2021  ? Type 2 diabetes mellitus with o

## 2022-04-21 ENCOUNTER — Ambulatory Visit: Payer: Medicare Other | Admitting: Orthopaedic Surgery

## 2022-04-21 ENCOUNTER — Encounter: Payer: Self-pay | Admitting: Orthopaedic Surgery

## 2022-04-21 ENCOUNTER — Ambulatory Visit (INDEPENDENT_AMBULATORY_CARE_PROVIDER_SITE_OTHER): Payer: Medicare Other

## 2022-04-21 DIAGNOSIS — M79604 Pain in right leg: Secondary | ICD-10-CM | POA: Diagnosis not present

## 2022-04-21 DIAGNOSIS — M545 Low back pain, unspecified: Secondary | ICD-10-CM

## 2022-04-21 MED ORDER — BACLOFEN 10 MG PO TABS: 10.0000 mg | ORAL_TABLET | Freq: Three times a day (TID) | ORAL | 0 refills | Status: DC

## 2022-04-21 MED ORDER — TRAMADOL HCL 50 MG PO TABS: 50.0000 mg | ORAL_TABLET | Freq: Four times a day (QID) | ORAL | 0 refills | Status: DC | PRN

## 2022-04-21 NOTE — Progress Notes (Signed)
The patient comes in today with acute right-sided low back pain after stepping into the bathtub a few weeks ago.  She is 88 years old and certainly frail.  She has ambulated with a cane.  She is on Plavix so she cannot take anti-inflammatories and is a diabetic but has good control.  She points to the lower aspect of her lumbar spine to the right side as a source of her pain.  She denies any groin pain bilaterally.  She does have a history of bilateral hip replacements.  These were done through an anterior approach. ? ?Both her hips move smoothly and fluidly.  She does have significant pain to palpation along the lumbar spine to the right side and does radiate some into the sciatic region but not down her leg. ? ?2 views lumbar spine show significant grade 1 to really almost grade 2 spondylolisthesis with severe arthritic changes in her lumbar spine.  This is changed just slightly when compared to previous films.  X-rays the pelvis and hip shows no acute findings and hip replacements are in good position. ? ?I let her know that we would send in some tramadol for pain and some baclofen to try as a muscle relaxant.  She will be careful using these medications.  We will see her back in about 3 weeks to make sure she is getting better.  If she is not, the next step would be considering an MRI of the lumbar spine to determine if she has benefit from any type of intervention such as an injection in her back.  She does have remote history of lumbar spine surgery done by one of my partners years ago. ?

## 2022-04-23 ENCOUNTER — Encounter: Payer: Medicare Other | Admitting: Rehabilitative and Restorative Service Providers"

## 2022-04-27 ENCOUNTER — Ambulatory Visit: Payer: Medicare Other | Admitting: Rehabilitative and Restorative Service Providers"

## 2022-04-27 ENCOUNTER — Encounter: Payer: Self-pay | Admitting: Rehabilitative and Restorative Service Providers"

## 2022-04-27 DIAGNOSIS — M5459 Other low back pain: Secondary | ICD-10-CM | POA: Diagnosis not present

## 2022-04-27 DIAGNOSIS — R262 Difficulty in walking, not elsewhere classified: Secondary | ICD-10-CM

## 2022-04-27 DIAGNOSIS — M6281 Muscle weakness (generalized): Secondary | ICD-10-CM

## 2022-04-27 DIAGNOSIS — M25552 Pain in left hip: Secondary | ICD-10-CM

## 2022-04-27 DIAGNOSIS — M25551 Pain in right hip: Secondary | ICD-10-CM

## 2022-04-27 DIAGNOSIS — M79605 Pain in left leg: Secondary | ICD-10-CM | POA: Diagnosis not present

## 2022-04-27 NOTE — Therapy (Signed)
?OUTPATIENT PHYSICAL THERAPY TREATMENT NOTE ? ? ?Patient Name: [name] ?MRN: [id number] ?DOB:12-03-1933, 88 y.o., female ?Today's Date: 04/27/2022 ? ? ? ?END OF SESSION:  ? PT End of Session - 04/27/22 1357   ? ? Visit Number 3   ? Number of Visits 20   ? Date for PT Re-Evaluation 06/15/22   ? Authorization Type UHC Medicare 20% coinsurance   ? PT Start Time 1358   ? PT Stop Time 1423   ? PT Time Calculation (min) 25 min   ? Activity Tolerance Patient limited by pain;Patient limited by fatigue   ? Behavior During Therapy [facility] for tasks assessed/performed   ? ?  ?  ? ?  ? ? ?Past Medical History:  ?Diagnosis Date  ? Allergy to IVP dye   ? Angina, class I ([place]) 04/22/2015  ? Atherosclerotic heart disease native coronary artery w/angina pectoris ([place]) 1992  ? a. 1992 s/p PTCA of LAD;  b. 1998 CABG x 3; c. 07/2001 Cath: Sev LM/LAD/LCX dzs, 3/3 patent grafts; d. 11/2013 Cath: stable graft anatomy; e. 10/2016 Cath: native 3VD, VG->OM nl, LIMA->LAD nl, VG->Diag 55.  ? Atherosclerotic heart disease of native coronary artery with angina pectoris ([place]) 01/14/2010  ? Qualifier: Diagnosis of  By: Deatra Ina MD, Sandy Salaam   ? Atrial septal aneurysm   ? Barrett's esophagus 03/04/2010  ? Qualifier: Diagnosis of  By: Shane Crutch, Amy S   ? Bilateral leg edema 04/22/2015  ? Bradycardia, mild briefly to the 40s, asymptomatic 05/16/2013  ? Carotid arterial disease ([place])   ? a. 05/2014 Carotid U/S: No signif bilat ICA stenosis, >60% L ECA.  ? Chronic anemia   ? Chronic diastolic CHF (congestive heart failure) ([place])   ? a. 10/2016 Echo: Ef 55-60%, no rwma, Gr1 DD, triv AI, PASP 57mHg, atrial septal aneurysm.  ? Chronic diastolic CHF (congestive heart failure), NYHA class 2 ([place] 08/11/2017  ? Claudication (H[place] 05/16/2013  ? Peripheral arterial occlusive disease with lifestyle limiting claudication   ? Degenerative joint disease 05/30/2018  ? DIVERTICULOSIS OF COLON 01/14/2010  ? Qualifier: Diagnosis of  By: KDeatra InaMD, RSandy Salaam  ? DM  (diabetes mellitus), type 2 with peripheral vascular complications (HCandler[place]McA[place]   ? On Oral medications.  ? DOE (dyspnea on exertion) 04/22/2015  ? Essential hypertension   ? FECAL INCONTINENCE 03/04/2010  ? Qualifier: Diagnosis of  By: EShane Crutch Amy S   ? GERD (gastroesophageal reflux disease)   ? Hyperlipidemia with target LDL less than 70   ? Inflammatory arthritis 10/13/2017  ? Neuropathy   ? Non-insulin dependent type 2 diabetes mellitus (HSh[place] 10/24/2015  ? NSTEMI (non-ST elevated myocardial infarction) (HC[place] 11/01/2016  ? PAD (peripheral artery disease) (HCle[place] Lake 05/2013; 09/2013  ? a. severe calcified POP-1 & TP Trunk Dz bilat;  b . 05/2013 Diamondback Rot Athrectomy (R POP) --> PTA R Pop, DES to R Peroneal;  c. 09/2013 PTA/Stenting of L Pop ([name]- retrograde access);  d. 04/2014 ABI's: R/L: 1.1/1.1.  ? Preoperative cardiovascular examination 04/15/2020  ? PUD (peptic ulcer disease)   ? Rheumatoid arthritis (HElk Falls   ? S/P CABG x 3 1998  ? LIMA-LAD, SVG-OM, SVG-D1  ? Severe claudication (HWhite [place] 05/2013  ? Referred for Peripheral Angio (Dr. BGwenlyn Found--> Dr. GBrunetta Jeansin [place]  ? [place] 01/14/2010  ? Qualifier: Diagnosis of  By: Nelson-Smith CMA (AAMA), [name]    ? Status post total replacement of right hip 10/12/2018  ? Vertigo 01/02/2014  ? ?  Past Surgical History:  ?Procedure Laterality Date  ? ATHERECTOMY Right 05/15/2013  ? Procedure: ATHERECTOMY;  Surgeon: Lorretta Harp, MD;  Location: [facility] CATH LAB;  Service: Cardiovascular;  Laterality: Right;  popliteal  ? BACK SURGERY    ? CARDIAC CATHETERIZATION  07/17/01  ? 2 v CAD with LM, circ, LAD, obtuse diag, mod stenosis of diag vein graft , mild stenosis of marg vein graft, nl EF, medical treatment  ? CARDIAC CATHETERIZATION  11/2013  ? Widely patent LIMA-LAD & SVG-OM with mild progression of proximal stenosis ~40-50% in SVG-D1; Widely patent native RCA with known severe native LCA disease  ? CARDIAC CATHETERIZATION N/A 11/01/2016  ? Procedure: Left Heart  Cath and Cors/Grafts Angiography;  Surgeon: [name], MD;  Location: [place] CV LAB;  Service: Cardiovascular: Hemodynamics: High LVEDP!.  Cors/Grafts: LM 55%, o-p LAD 100% then after D1 -> LIMA-LAD patent, SVG-Diag ~55%. small RI wiht ~70% ostial. O-p Cx 80% - pCx 70% --> SVG-bifurcating OM patent. -- med Rx  ? CORONARY ANGIOPLASTY  1992  ? PCI to LAD  ? CORONARY ARTERY BYPASS GRAFT  1998  ? LIMA-LAD, SVG-diagonal (none roughly 50% stenosis), SVG-OM  ? LEFT HEART CATHETERIZATION WITH CORONARY ANGIOGRAM N/A 11/27/2013  ? Procedure: LEFT HEART CATHETERIZATION WITH CORONARY ANGIOGRAM;  Surgeon: [name], MD;  Location: [facility] CATH LAB;  Service: Cardiovascular;  Laterality: N/A;  ? LOW EXTREMITY DOPPLERS/ABI  10/2016  ? Patent right SFA, popliteal and peroneal tendons. Patent left SFA and popliteal stent. 30-50% stenosis in the right common femoral and popliteal arteries, 50-74% stenosis in left profunda femoris artery. --> 1 year follow-up.  RABI - 1.2, LABI 1.1  ? LOWER EXTREMITY ANGIOGRAM N/A 02/22/2013  ? Procedure: LOWER EXTREMITY ANGIOGRAM;  Surgeon: Lorretta Harp, MD;  Location: [facility] CATH LAB;  Service: Cardiovascular;  Laterality: N/A;  ? LOWER EXTREMITY ANGIOGRAM N/A 07/20/2013  ? Procedure: LOWER EXTREMITY ANGIOGRAM;  Surgeon: Lorretta Harp, MD;  Location: [facility] CATH LAB;  Service: Cardiovascular;  Laterality: N/A;  ? NM MYOVIEW LTD  04/03/2013; 5/26'/2016  ? Lexiscan: a)  Apical perfusion defect with no ischemia. Consider prior infarct versus breast attenuation.;; b) 5/'16: LOW RISK, Nl EF ~55-65%, No Ischemia /Infarction  ? NM MYOVIEW LTD  10/'19; 4/'21  ? a) LOW RISK.  EF 55%.  No ischemia or infarction. b) EF estimated 81%.  Suboptimal images.  May be subtle inferior ischemia-initially read by radiology as intermediate risk, however overread by cardiology to be mild low risk..  ? PERCUTANEOUS STENT INTERVENTION Right 05/15/2013  ? Procedure: PERCUTANEOUS STENT INTERVENTION;  Surgeon: Lorretta Harp, MD;  Location: [facility] CATH LAB;  Service: Cardiovascular;  Laterality: Right;  tibioperoneal trunk  ? Peroneal Artery Stent  05/15/13  ? Diamondback rot. atherectomy of high grade segmental popliteal stenosis then Chocolate balloonPTA; then angiosculpt PTA of the prox peroneal with stent-Xpedition   ? pv angio  07/20/13  ? high-grade calcified popliteal disease with one-vessel runoff via a small anterior tibial artery that has proximal disease as well, no intervention  ? TOTAL HIP ARTHROPLASTY Right 10/12/2018  ? Procedure: RIGHT TOTAL HIP ARTHROPLASTY ANTERIOR APPROACH;  Surgeon: Mcarthur Rossetti, MD;  Location: [facility] ORS;  Service: Orthopedics;  Laterality: Right;  ? TOTAL HIP ARTHROPLASTY Left 11/21/2020  ? Procedure: LEFT TOTAL HIP ARTHROPLASTY ANTERIOR APPROACH;  Surgeon: Mcarthur Rossetti, MD;  Location: [facility] ORS;  Service: Orthopedics;  Laterality: Left;  ? TRANSTHORACIC ECHOCARDIOGRAM  11/'17; 10/19  ? a) EF  55-60%. Gr 1 DD. Normal PAP. Aortic Sclerosis.;; b) Normal LV size and function with vigorous EF 65 to 70%.  GR 1 DD.  Severe LA dilation (suggestive of probably worsening: GR 1 DD).  ? TRANSTHORACIC ECHOCARDIOGRAM  03/18/2020  ? EF 70-75%.  Hyperdynamic.  GR 1 DD.  No R WMA.  Mild LA dilation.  Mild aortic valve sclerosis but no stenosis.-No change from prior echo  ? TUBAL LIGATION    ? ?Patient Active Problem List  ? Diagnosis Date Noted  ? Anxiety 11/04/2021  ? Avascular necrosis ([place]) 11/04/2021  ? Chronic interstitial cystitis 11/04/2021  ? Diabetic peripheral neuropathy associated with type 2 diabetes mellitus ([place]) 11/04/2021  ? Polyneuropathy due to type 2 diabetes mellitus ([place]) 11/04/2021  ? Eczema 11/04/2021  ? History of urinary tract infection 11/04/2021  ? Neuropathy 11/04/2021  ? Other long term (current) drug therapy 11/04/2021  ? Peptic ulcer disease 11/04/2021  ? Presence of aortocoronary bypass graft 11/04/2021  ? Skin sensation disturbance 11/04/2021  ? Vitamin D deficiency  11/04/2021  ? Gastroesophageal reflux disease 11/04/2021  ? Rheumatoid arthritis ([place]) 11/04/2021  ? Peripheral vascular disease ([place]) 11/04/2021  ? Type 2 diabetes mellitus with other specified complication

## 2022-04-29 ENCOUNTER — Encounter: Payer: Self-pay | Admitting: Rehabilitative and Restorative Service Providers"

## 2022-04-29 ENCOUNTER — Ambulatory Visit: Payer: Medicare Other | Admitting: Rehabilitative and Restorative Service Providers"

## 2022-04-29 DIAGNOSIS — M6281 Muscle weakness (generalized): Secondary | ICD-10-CM | POA: Diagnosis not present

## 2022-04-29 DIAGNOSIS — M79605 Pain in left leg: Secondary | ICD-10-CM

## 2022-04-29 DIAGNOSIS — M25552 Pain in left hip: Secondary | ICD-10-CM | POA: Diagnosis not present

## 2022-04-29 DIAGNOSIS — R262 Difficulty in walking, not elsewhere classified: Secondary | ICD-10-CM

## 2022-04-29 DIAGNOSIS — M5459 Other low back pain: Secondary | ICD-10-CM | POA: Diagnosis not present

## 2022-04-29 DIAGNOSIS — M25551 Pain in right hip: Secondary | ICD-10-CM

## 2022-04-29 NOTE — Therapy (Signed)
OUTPATIENT PHYSICAL THERAPY TREATMENT NOTE   Patient Name: [name] MRN: [id number] DOB:03-02-1933, 88 y.o., female Today's Date: 04/29/2022    END OF SESSION:   PT End of Session - 04/29/22 1355     Visit Number 4    Number of Visits 20    Date for PT Re-Evaluation 06/15/22    Authorization Type U[employer] Medicare 20% coinsurance    PT Start Time 8588    PT Stop Time 1427    PT Time Calculation (min) 38 min    Activity Tolerance Patient limited by pain;Patient limited by fatigue    Behavior During Therapy [facility] for tasks assessed/performed              Past Medical History:  Diagnosis Date   Allergy to IVP dye    Angina, class I ([place]) 04/22/2015   Atherosclerotic heart disease native coronary artery w/angina pectoris ([place]) 1992   a. 1992 s/p PTCA of LAD;  b. 1998 CABG x 3; c. 07/2001 Cath: Sev LM/LAD/LCX dzs, 3/3 patent grafts; d. 11/2013 Cath: stable graft anatomy; e. 10/2016 Cath: native 3VD, VG->OM nl, LIMA->LAD nl, VG->Diag 55.   Atherosclerotic heart disease of native coronary artery with angina pectoris ([place]) 01/14/2010   Qualifier: Diagnosis of  By: Deatra Ina MD, Sandy Salaam    Atrial septal aneurysm    Barrett's esophagus 03/04/2010   Qualifier: Diagnosis of  By: Shane Crutch, Amy S    Bilateral leg edema 04/22/2015   Bradycardia, mild briefly to the 40s, asymptomatic 05/16/2013   Carotid arterial disease ([place])    a. 05/2014 Carotid U/S: No signif bilat ICA stenosis, >60% L ECA.   Chronic anemia    Chronic diastolic CHF (congestive heart failure) ([place])    a. 10/2016 Echo: Ef 55-60%, no rwma, Gr1 DD, triv AI, PASP 31mmHg, atrial septal aneurysm.   Chronic diastolic CHF (congestive heart failure), NYHA class 2 ([place]) 08/11/2017   Claudication ([place]) 05/16/2013   Peripheral arterial occlusive disease with lifestyle limiting claudication    Degenerative joint disease 05/30/2018   DIVERTICULOSIS OF COLON 01/14/2010   Qualifier: Diagnosis of  By: Deatra Ina MD, Sandy Salaam    DM  (diabetes mellitus), type 2 with peripheral vascular complications ([place])    On Oral medications.   DOE (dyspnea on exertion) 04/22/2015   Essential hypertension    FECAL INCONTINENCE 03/04/2010   Qualifier: Diagnosis of  By: Trellis Paganini PA-c, Amy S    GERD (gastroesophageal reflux disease)    Hyperlipidemia with target LDL less than 70    Inflammatory arthritis 10/13/2017   Neuropathy    Non-insulin dependent type 2 diabetes mellitus ([place]) 10/24/2015   NSTEMI (non-ST elevated myocardial infarction) ([place]) 11/01/2016   PAD (peripheral artery disease) ([place]) 05/2013; 09/2013   a. severe calcified POP-1 & TP Trunk Dz bilat;  b . 05/2013 Diamondback Rot Athrectomy (R POP) --> PTA R Pop, DES to R Peroneal;  c. 09/2013 PTA/Stenting of L Pop [name] - retrograde access);  d. 04/2014 ABI's: R/L: 1.1/1.1.   Preoperative cardiovascular examination 04/15/2020   PUD (peptic ulcer disease)    Rheumatoid arthritis ([place])    S/P CABG x 3 1998   LIMA-LAD, SVG-OM, SVG-D1   Severe claudication ([place]) 05/2013   Referred for Peripheral Angio (Dr. Gwenlyn Found --> Dr. Brunetta Jeans in [place])   [place] 01/14/2010   Qualifier: Diagnosis of  By: Harlon Ditty CMA ([place]), [name]     Status post total replacement of right hip 10/12/2018   Vertigo 01/02/2014  Past Surgical History:  Procedure Laterality Date   ATHERECTOMY Right 05/15/2013   Procedure: ATHERECTOMY;  Surgeon: Lorretta Harp, MD;  Location: [facility] CATH LAB;  Service: Cardiovascular;  Laterality: Right;  popliteal   BACK SURGERY     CARDIAC CATHETERIZATION  07/17/01   2 v CAD with LM, circ, LAD, obtuse diag, mod stenosis of diag vein graft , mild stenosis of marg vein graft, nl EF, medical treatment   CARDIAC CATHETERIZATION  11/2013   Widely patent LIMA-LAD & SVG-OM with mild progression of proximal stenosis ~40-50% in SVG-D1; Widely patent native RCA with known severe native LCA disease   CARDIAC CATHETERIZATION N/A 11/01/2016   Procedure: Left Heart  Cath and Cors/Grafts Angiography;  Surgeon: [name], MD;  Location: [place] CV LAB;  Service: Cardiovascular: Hemodynamics: High LVEDP!.  Cors/Grafts: LM 55%, o-p LAD 100% then after D1 -> LIMA-LAD patent, SVG-Diag ~55%. small RI wiht ~70% ostial. O-p Cx 80% - pCx 70% --> SVG-bifurcating OM patent. -- med Rx   CORONARY ANGIOPLASTY  1992   PCI to LAD   CORONARY ARTERY BYPASS GRAFT  1998   LIMA-LAD, SVG-diagonal (none roughly 50% stenosis), SVG-OM   LEFT HEART CATHETERIZATION WITH CORONARY ANGIOGRAM N/A 11/27/2013   Procedure: LEFT HEART CATHETERIZATION WITH CORONARY ANGIOGRAM;  Surgeon: [name], MD;  Location: [facility] CATH LAB;  Service: Cardiovascular;  Laterality: N/A;   LOW EXTREMITY DOPPLERS/ABI  10/2016   Patent right SFA, popliteal and peroneal tendons. Patent left SFA and popliteal stent. 30-50% stenosis in the right common femoral and popliteal arteries, 50-74% stenosis in left profunda femoris artery. --> 1 year follow-up.  RABI - 1.2, LABI 1.1   LOWER EXTREMITY ANGIOGRAM N/A 02/22/2013   Procedure: LOWER EXTREMITY ANGIOGRAM;  Surgeon: Lorretta Harp, MD;  Location[facility] C[facility] LAB;  Service: Cardiovascular;  Laterality: N/A;   LOWER EXTREMITY ANGIOGRAM N/A 07/20/2013   Procedure: LOWER EXTREMITY ANGIOGRAM;  Surgeon: Lorretta Harp, MD;  Location: [facility] CATH LAB;  Service: Cardiovascular;  Laterality: N/A;   NM MYOVIEW LTD  04/03/2013; 5/26'/2016   Lexiscan: a)  Apical perfusion defect with no ischemia. Consider prior infarct versus breast attenuation.;; b) 5/'16: LOW RISK, Nl EF ~55-65%, No Ischemia /Infarction   NM MYOVIEW LTD  10/'19; 4/'21   a) LOW RISK.  EF 55%.  No ischemia or infarction. b) EF estimated 81%.  Suboptimal images.  May be subtle inferior ischemia-initially read by radiology as intermediate risk, however overread by cardiology to be mild low risk.Marland Kitchen   PERCUTANEOUS STENT INTERVENTION Right 05/15/2013   Procedure: PERCUTANEOUS STENT INTERVENTION;  Surgeon: Lorretta Harp, MD;  Location: [facility] CATH LAB;  Service: Cardiovascular;  Laterality: Right;  tibioperoneal trunk   Peroneal Artery Stent  05/15/13   Diamondback rot. atherectomy of high grade segmental popliteal stenosis then Chocolate balloonPTA; then angiosculpt PTA of the prox peroneal with stent-Xpedition    pv angio  07/20/13   high-grade calcified popliteal disease with one-vessel runoff via a small anterior tibial artery that has proximal disease as well, no intervention   TOTAL HIP ARTHROPLASTY Right 10/12/2018   Procedure: RIGHT TOTAL HIP ARTHROPLASTY ANTERIOR APPROACH;  Surgeon: Mcarthur Rossetti, MD;  Location: [facility] ORS;  Service: Orthopedics;  Laterality: Right;   TOTAL HIP ARTHROPLASTY Left 11/21/2020   Procedure: LEFT TOTAL HIP ARTHROPLASTY ANTERIOR APPROACH;  Surgeon: Mcarthur Rossetti, MD;  Location: [facility] ORS;  Service: Orthopedics;  Laterality: Left;   TRANSTHORACIC ECHOCARDIOGRAM  11/'17; 10/19   a) EF  55-60%. Gr 1 DD. Normal PAP. Aortic Sclerosis.;; b) Normal LV size and function with vigorous EF 65 to 70%.  GR 1 DD.  Severe LA dilation (suggestive of probably worsening: GR 1 DD).   TRANSTHORACIC ECHOCARDIOGRAM  03/18/2020   EF 70-75%.  Hyperdynamic.  GR 1 DD.  No R WMA.  Mild LA dilation.  Mild aortic valve sclerosis but no stenosis.-No change from prior echo   TUBAL LIGATION     Patient Active Problem List   Diagnosis Date Noted   Anxiety 11/04/2021   Avascular necrosis ([place]) 11/04/2021   Chronic interstitial cystitis 11/04/2021   Diabetic peripheral neuropathy associated with type 2 diabetes mellitus ([place]) 11/04/2021   Polyneuropathy due to type 2 diabetes mellitus ([place]) 11/04/2021   Eczema 11/04/2021   History of urinary tract infection 11/04/2021   Neuropathy 11/04/2021   Other long term (current) drug therapy 11/04/2021   Peptic ulcer disease 11/04/2021   Presence of aortocoronary bypass graft 11/04/2021   Skin sensation disturbance 11/04/2021   Vitamin D deficiency  11/04/2021   Gastroesophageal reflux disease 11/04/2021   Rheumatoid arthritis ([place]) 11/04/2021   Peripheral vascular disease ([place]) 11/04/2021   Type 2 diabetes mellitus with other specified complication ([place]) 49/17/9150   Chronic diastolic heart failure ([place]) 01/07/2021   Status post total hip replacement, right 12/04/2020   Status post total replacement of left hip 11/21/2020   Near syncope 07/10/2020   Hx of CABG 07/10/2020   Preoperative cardiovascular examination 04/15/2020   Chest pain 03/18/2020   Unilateral primary osteoarthritis, left hip 04/03/2019   History of right hip replacement 04/03/2019   Status post total replacement of right hip 10/12/2018   Unilateral primary osteoarthritis, right hip 09/12/2018   Degenerative joint disease 05/30/2018   Anal fissure 11/10/2017   Rectal bleeding 11/10/2017   Rectal pain 11/10/2017   Inflammatory arthritis 10/13/2017   Latent tuberculosis by blood test 10/13/2017   Chronic diastolic CHF (congestive heart failure), NYHA class 2 ([place]) 08/11/2017   Fatigue due to treatment 01/09/2017   NSTEMI (non-ST elevated myocardial infarction) ([place]) 11/01/2016   Diabetes mellitus ([place]) 10/24/2015   Non-insulin dependent type 2 diabetes mellitus ([place]) 10/24/2015   Angina, class I ([place]) 04/22/2015   DOE (dyspnea on exertion) 04/22/2015   Bilateral leg edema 04/22/2015   Accumulation of fluid in tissues 03/21/2014   Vertigo, central 01/02/2014   Vertigo 01/02/2014   Limb pain- echymosis, pain Lt radial artery after cath 12/11/2013   Dyslipidemia, goal LDL below 70 05/17/2013   Essential hypertension 05/17/2013   Claudication ([place]) 05/16/2013   PTA/ Stent Rt popliteal and Rt peroneal artery 05/16/13 05/16/2013   PAD,severe calcified pop and tibial peroneal trunk disease bilateraly    ABDOMINAL PAIN RIGHT LOWER QUADRANT 10/07/2010   CVA-STROKE 03/04/2010   BARRETT'S ESOPHAGUS 03/04/2010   FECAL INCONTINENCE 03/04/2010   DM 01/14/2010   Anemia  of chronic disease 01/14/2010   Atherosclerotic heart disease of native coronary artery with angina pectoris ([place]) 01/14/2010   DIVERTICULOSIS OF COLON 01/14/2010   PERSONAL HISTORY OF COLONIC POLYPS 01/14/2010   SHELLFISH ALLERGY 01/14/2010    PCP: Janie Morning, DO   REFERRING PROVIDER: Mcarthur Rossetti*   REFERRING DIAG: V69.79 (ICD-10-CM) - It band syndrome, left  ONSET DATE: Dec 2021  THERAPY DIAG:  Pain in left leg  Pain in left hip  Other low back pain  Muscle weakness (generalized)  Pain in right hip  Difficulty in walking, not elsewhere classified  PERTINENT HISTORY: Left hip  pain with  IT band syndrome; troch bursitis and previous THA 2021 PMH: CAD s/p CABG, CHF, PAD with claudication, DM with neuropathy, HTN, HLD, NSTEMI, RA, bil THA, hx back surgery  PRECAUTIONS: None  SUBJECTIVE: Pt indicated pain around 5/10 or so today.  Pt stated its hurting but she tries to keep going.  Pt indicated less use of HEP than instructed.   PAIN:  NPRS scale: at worst 5/10 Pain location: Back, Rt hip mainly Pain description: constant pain Aggravating factors: increased activity  Relieving factors: heat ice, rest, medicine (not much)   OBJECTIVE:    DIAGNOSTIC FINDINGS: 04/06/2022: Previous xray: An AP pelvis and lateral left hip shows well-seated bilateral total hip  arthroplasties with no complicating features.   PATIENT SURVEYS:  04/06/2022 FOTO intake:  53 predicted:  59   COGNITION: 04/06/2022 Overall cognitive status: Within functional limits for tasks assessed                               POSTURE:   04/06/2022 : Reduced lumbar lordosis c mild forward trunk lean in standing positioning c mild decrease of stance on Lt leg   PALPATION:  04/06/2022: tenderness noted throughout Lt lateral thigh and posterior/lateral hip c Trigger points noted in glute med, max, lateral vastus lateralis on Lt.  Mild tenderness to touch in Rt lumbar paraspinals, QL   LUMBAR  AROM: 04/27/2022:  Lumbar extension 50% WFL today c ERP lumbar noted.    04/06/2022:   Extension: 25 % WFL c end range pain on Rt lumbar/hip.  Repeated x 5 in standing: improved to 75% WFL c continued ERP in Rt lumbar/hip (no centralization/perhiperialization)   LE ROM:    ROM Right 04/06/2022 Left 04/06/2022  Hip flexion   100 PROM in supine  Hip extension      Hip abduction      Hip adduction      Hip internal rotation 30 PROM in supine 90 deg flexion 25 PROM in supine 90 deg flexion  Hip external rotation 42 PROM in supine 90 deg flexion 35 PROM in supine 90 deg flexion  Knee flexion      Knee extension      Ankle dorsiflexion      Ankle plantarflexion      Ankle inversion      Ankle eversion       (Blank rows = not tested) 04/06/2022: pain limited end range in all hip movements.    LE MMT:   MMT Right 04/06/2022 Left 04/06/2022  Hip flexion 4/5 4/5  Hip extension      Hip abduction   3/5 c Lt leg pain  Hip adduction      Hip internal rotation      Hip external rotation      Knee flexion 5/5 5/5  Knee extension 4/5 c back pain 5/5  Ankle dorsiflexion      Ankle plantarflexion      Ankle inversion      Ankle eversion       (Blank rows = not tested)   LOWER EXTREMITY SPECIAL TESTS:  04/06/2022 : (+) FABER bilateral (Lt for hip pain, Rt for back pain)   FUNCTIONAL TESTS:  04/06/2022        18 inch chair sit to stand: no UE on 2nd attempt c pain noted  TUG :  c SPC 14.92 seconds                                                 Lt SLS: 6 seconds, Rt SLS 7 seconds GAIT: 04/06/2022 SPC in Rt UE household distances within clinic c wider base of support noted and mild decrease of stance on Lt leg and limited hip extension bilateral     TODAY'S TREATMENT: 04/29/2022:             Therex:                         Nustep lvl 5 8 mins UE/LE   Standing lumbar extension 2 x 5 AROM    Supine hooklying hip add ball squeeze 5 sec hold x 10   Supine green band  clam shell x20 bilateral   Supine bridge 2 x 10   Supine figure 4 push away 15 sec x 3 bilateral    04/27/2022:             Therex:                         Nustep lvl 5 6 mins UE/LE   Standing lumbar extension x 5 AROM    Extended time in review of existing HEP c printout provided again with verbal and visual review of exercises.  No attempts at intervention today secondary to Pt complaints of nausea at times while sitting and resting midway through visit.   04/19/2022:             Therex:                         Nustep lvl 5 6 mins UE/LE   Seated yellow theraball squeeze 5 sec hold x 10 hip adduction   Seated yellow theraball press down on knees c UE 5 sec hold x 10   Seated green band clam shell x 15 bilateral  PATIENT EDUCATION:  04/27/2022 Education details: HEP, POC Person educated: Patient Education method: Consulting civil engineer, Demonstration, Verbal cues, and Handouts Education comprehension: verbalized understanding, verbal cues required, and needs further education     HOME EXERCISE PROGRAM: 04/27/2022 Access Code: [email or web address] URL: [email or web address] Date: 04/27/2022 Prepared by: Scot Jun  Exercises - Supine Bridge  - 2 x daily - 7 x weekly - 3 sets - 10 reps - 2 hold - Supine Piriformis Stretch with Foot on Ground  - 2 x daily - 7 x weekly - 1 sets - 5 reps - 15-20 hold - Sit to Stand  - 1 x daily - 7 x weekly - 3 sets - 10 reps - Supine Lower Trunk Rotation  - 2-3 x daily - 7 x weekly - 1 sets - 3-5 reps - 15 hold - Hooklying Clamshell with Resistance  - 2 x daily - 7 x weekly - 1-2 sets - 10 reps - Standing Lumbar Extension with Counter  - 3-5 x daily - 7 x weekly - 1 sets - 5-10 reps   ASSESSMENT:   CLINICAL IMPRESSION: Visual quality of ambulation improved today compared to previous visits.  Cane use in Rt UE but not always using it on floor.  Continued vocal complaints of symptoms noted throughout intervention   Continued  consistent cues required for  use and knowledge improvements of HEP.    Pt to benefit from improved mobility, specifically in lumbar and hip c focus on improved hip extension ability for ambulation.      OBJECTIVE IMPAIRMENTS Abnormal gait, decreased activity tolerance, decreased balance, decreased coordination, decreased endurance, decreased mobility, difficulty walking, decreased ROM, decreased strength, hypomobility, increased fascial restrictions, impaired perceived functional ability, increased muscle spasms, impaired flexibility, improper body mechanics, postural dysfunction, and pain.    ACTIVITY LIMITATIONS cleaning, community activity, meal prep, and shopping.    PERSONAL FACTORS Time since onset of injury/illness/exacerbation, PMH: CAD s/p CABG, CHF, PAD with claudication, DM with neuropathy, HTN, HLD, NSTEMI, RA, bil THA, hx back surgery are also affecting patient's functional outcome.      REHAB POTENTIAL: Fair     CLINICAL DECISION MAKING: Evolving/moderate complexity   EVALUATION COMPLEXITY: Medium     GOALS: Goals reviewed with patient? Yes   Short term PT Goals (target date for Short term goals are 3 weeks 04/27/2022) Patient will demonstrate independent use of home exercise program to maintain progress from in clinic treatments. Goal status: not met - assessed 04/27/2022 (cues required)    Long term PT goals (target dates for all long term goals are 10 weeks  06/15/2022 )   1. Patient will demonstrate/report pain at worst less than or equal to 2/10 to facilitate minimal limitation in daily activity secondary to pain symptoms. Goal status: on going - assessed 04/27/2022   2. Patient will demonstrate independent use of home exercise program to facilitate ability to maintain/progress functional gains from skilled physical therapy services. Goal status: on going - assessed 04/27/2022   3. Patient will demonstrate FOTO outcome > or = 59 % to indicate reduced disability due to condition. Goal status: on  going - assessed 04/27/2022   4.  Patient will demonstrate lumbar extension 100 % WFL s symptoms to facilitate upright standing, walking posture at PLOF s limitation. Goal status: on going - assessed 04/27/2022   5.  Patient will demonstrate bilateral hip flexion MMT 5/5, hip abduction > or = 4/5, knee MMT 5/5 throughout to facilitate ability to perform usual standing, walking, stairs at PLOF s limitation due to symptoms.   Goal status: on going - assessed 04/27/2022   6.  Patient will demonstrate bilateral SLS > or = 10 seconds to facilitate stability in ambulation.   Goal status: on going - assessed 04/27/2022   7.  Patient will demonstrate TUG < 14 seconds independently to indicate reduce fall risk.  a.  Goal Status: on going - assessed 04/27/2022     PLAN: PT FREQUENCY: 1-2x/week   PT DURATION: 10 weeks   PLANNED INTERVENTIONS: Therapeutic exercises, Therapeutic activity, Neuro Muscular re-education, Balance training, Gait training, Patient/Family education, Joint mobilization, Stair training, DME instructions, Dry Needling, Electrical stimulation, Cryotherapy, Moist heat, Taping, Ultrasound, Ionotophoresis 22m/ml Dexamethasone, and Manual therapy.  All included unless contraindicated   PLAN FOR NEXT SESSION: Attempt progressive mobiity improvements as tolerated.  Review HEP continued    MScot Jun PT, DPT, OCS, ATC 04/29/22  2:24 PM

## 2022-05-04 ENCOUNTER — Encounter: Payer: Self-pay | Admitting: Rehabilitative and Restorative Service Providers"

## 2022-05-04 ENCOUNTER — Ambulatory Visit: Payer: Medicare Other | Admitting: Rehabilitative and Restorative Service Providers"

## 2022-05-04 DIAGNOSIS — M6281 Muscle weakness (generalized): Secondary | ICD-10-CM

## 2022-05-04 DIAGNOSIS — M25552 Pain in left hip: Secondary | ICD-10-CM | POA: Diagnosis not present

## 2022-05-04 DIAGNOSIS — M79605 Pain in left leg: Secondary | ICD-10-CM

## 2022-05-04 DIAGNOSIS — R262 Difficulty in walking, not elsewhere classified: Secondary | ICD-10-CM

## 2022-05-04 DIAGNOSIS — M5459 Other low back pain: Secondary | ICD-10-CM

## 2022-05-04 DIAGNOSIS — M25551 Pain in right hip: Secondary | ICD-10-CM

## 2022-05-04 NOTE — Therapy (Signed)
OUTPATIENT PHYSICAL THERAPY TREATMENT NOTE   Patient Name: [name] MRN: [id number] DOB:11/02/1933, 88 y.o., female Today's Date: 05/04/2022    END OF SESSION:   PT End of Session - 05/04/22 1357     Visit Number 5    Number of Visits 20    Date for PT Re-Evaluation 06/15/22    Authorization Type [employer] Medicare 20% coinsurance    PT Start Time 1357    PT Stop Time 1425    PT Time Calculation (min) 28 min    Activity Tolerance Patient limited by pain;Patient limited by fatigue    Behavior During Therapy WFL for tasks assessed/performed               Past Medical History:  Diagnosis Date   Allergy to IVP dye    Angina, class I (HCC) 04/22/2015   Atherosclerotic heart disease native coronary artery w/angina pectoris (HCC) 1992   a. 1992 s/p PTCA of LAD;  b. 1998 CABG x 3; c. 07/2001 Cath: Sev LM/LAD/LCX dzs, 3/3 patent grafts; d. 11/2013 Cath: stable graft anatomy; e. 10/2016 Cath: native 3VD, VG->OM nl, LIMA->LAD nl, VG->Diag 55.   Atherosclerotic heart disease of native coronary artery with angina pectoris (HCC) 01/14/2010   Qualifier: Diagnosis of  By: Kaplan MD, Robert D    Atrial septal aneurysm    Barrett's esophagus 03/04/2010   Qualifier: Diagnosis of  By: Esterwood PA-c, Amy S    Bilateral leg edema 04/22/2015   Bradycardia, mild briefly to the 40s, asymptomatic 05/16/2013   Carotid arterial disease (HCC)    a. 05/2014 Carotid U/S: No signif bilat ICA stenosis, >60% L ECA.   Chronic anemia    Chronic diastolic CHF (congestive heart failure) (HCC)    a. 10/2016 Echo: Ef 55-60%, no rwma, Gr1 DD, triv AI, PASP 35mmHg, atrial septal aneurysm.   Chronic diastolic CHF (congestive heart failure), NYHA class 2 (HCC) 08/11/2017   Claudication (HCC) 05/16/2013   Peripheral arterial occlusive disease with lifestyle limiting claudication    Degenerative joint disease 05/30/2018   DIVERTICULOSIS OF COLON 01/14/2010   Qualifier: Diagnosis of  By: Kaplan MD, Robert D     DM (diabetes mellitus), type 2 with peripheral vascular complications (HCC)    On Oral medications.   DOE (dyspnea on exertion) 04/22/2015   Essential hypertension    FECAL INCONTINENCE 03/04/2010   Qualifier: Diagnosis of  By: Esterwood PA-c, Amy S    GERD (gastroesophageal reflux disease)    Hyperlipidemia with target LDL less than 70    Inflammatory arthritis 10/13/2017   Neuropathy    Non-insulin dependent type 2 diabetes mellitus (HCC) 10/24/2015   NSTEMI (non-ST elevated myocardial infarction) (HCC) 11/01/2016   PAD (peripheral artery disease) (HCC) 05/2013; 09/2013   a. severe calcified POP-1 & TP Trunk Dz bilat;  b . 05/2013 Diamondback Rot Athrectomy (R POP) --> PTA R Pop, DES to R Peroneal;  c. 09/2013 PTA/Stenting of L Pop (G. Adams - retrograde access);  d. 04/2014 ABI's: R/L: 1.1/1.1.   Preoperative cardiovascular examination 04/15/2020   PUD (peptic ulcer disease)    Rheumatoid arthritis (HCC)    S/P CABG x 3 1998   LIMA-LAD, SVG-OM, SVG-D1   Severe claudication (HCC) 05/2013   Referred for Peripheral Angio (Dr. Berry --> Dr. George Adams in [place])   SHELLFISH ALLERGY 01/14/2010   Qualifier: Diagnosis of  By: Nelson-Smith CMA (AAMA), [name]     Status post total replacement of right hip 10/12/2018   Vertigo   01/02/2014   Past Surgical History:  Procedure Laterality Date   ATHERECTOMY Right 05/15/2013   Procedure: ATHERECTOMY;  Surgeon: Lorretta Harp, MD;  Location: [facility] CATH LAB;  Service: Cardiovascular;  Laterality: Right;  popliteal   BACK SURGERY     CARDIAC CATHETERIZATION  07/17/01   2 v CAD with LM, circ, LAD, obtuse diag, mod stenosis of diag vein graft , mild stenosis of marg vein graft, nl EF, medical treatment   CARDIAC CATHETERIZATION  11/2013   Widely patent LIMA-LAD & SVG-OM with mild progression of proximal stenosis ~40-50% in SVG-D1; Widely patent native RCA with known severe native LCA disease   CARDIAC CATHETERIZATION N/A 11/01/2016   Procedure: Left Heart  Cath and Cors/Grafts Angiography;  Surgeon: [name], MD;  Location: [facility] CV LAB;  Service: Cardiovascular: Hemodynamics: High LVEDP!.  Cors/Grafts: LM 55%, o-p LAD 100% then after D1 -> LIMA-LAD patent, SVG-Diag ~55%. small RI wiht ~70% ostial. O-p Cx 80% - pCx 70% --> SVG-bifurcating OM patent. -- med Rx   CORONARY ANGIOPLASTY  1992   PCI to LAD   CORONARY ARTERY BYPASS GRAFT  1998   LIMA-LAD, SVG-diagonal (none roughly 50% stenosis), SVG-OM   LEFT HEART CATHETERIZATION WITH CORONARY ANGIOGRAM N/A 11/27/2013   Procedure: LEFT HEART CATHETERIZATION WITH CORONARY ANGIOGRAM;  Surgeon: [name], MD;  Location: [facility] CATH LAB;  Service: Cardiovascular;  Laterality: N/A;   LOW EXTREMITY DOPPLERS/ABI  10/2016   Patent right SFA, popliteal and peroneal tendons. Patent left SFA and popliteal stent. 30-50% stenosis in the right common femoral and popliteal arteries, 50-74% stenosis in left profunda femoris artery. --> 1 year follow-up.  RABI - 1.2, LABI 1.1   LOWER EXTREMITY ANGIOGRAM N/A 02/22/2013   Procedure: LOWER EXTREMITY ANGIOGRAM;  Surgeon: Lorretta Harp, MD;  Location: [facility] CATH LAB;  Service: Cardiovascular;  Laterality: N/A;   LOWER EXTREMITY ANGIOGRAM N/A 07/20/2013   Procedure: LOWER EXTREMITY ANGIOGRAM;  Surgeon: Lorretta Harp, MD;  Location: [facility] CATH LAB;  Service: Cardiovascular;  Laterality: N/A;   NM MYOVIEW LTD  04/03/2013; 5/26'/2016   Lexiscan: a)  Apical perfusion defect with no ischemia. Consider prior infarct versus breast attenuation.;; b) 5/'16: LOW RISK, Nl EF ~55-65%, No Ischemia /Infarction   NM MYOVIEW LTD  10/'19; 4/'21   a) LOW RISK.  EF 55%.  No ischemia or infarction. b) EF estimated 81%.  Suboptimal images.  May be subtle inferior ischemia-initially read by radiology as intermediate risk, however overread by cardiology to be mild low risk.Marland Kitchen   PERCUTANEOUS STENT INTERVENTION Right 05/15/2013   Procedure: PERCUTANEOUS STENT INTERVENTION;  Surgeon: Lorretta Harp, MD;  Location: [facility] CATH LAB;  Service: Cardiovascular;  Laterality: Right;  tibioperoneal trunk   Peroneal Artery Stent  05/15/13   Diamondback rot. atherectomy of high grade segmental popliteal stenosis then Chocolate balloonPTA; then angiosculpt PTA of the prox peroneal with stent-Xpedition    pv angio  07/20/13   high-grade calcified popliteal disease with one-vessel runoff via a small anterior tibial artery that has proximal disease as well, no intervention   TOTAL HIP ARTHROPLASTY Right 10/12/2018   Procedure: RIGHT TOTAL HIP ARTHROPLASTY ANTERIOR APPROACH;  Surgeon: Mcarthur Rossetti, MD;  Location: [facility] ORS;  Service: Orthopedics;  Laterality: Right;   TOTAL HIP ARTHROPLASTY Left 11/21/2020   Procedure: LEFT TOTAL HIP ARTHROPLASTY ANTERIOR APPROACH;  Surgeon: Mcarthur Rossetti, MD;  Location: [facility] ORS;  Service: Orthopedics;  Laterality: Left;   TRANSTHORACIC ECHOCARDIOGRAM  11/'17; 10/19  a) EF 55-60%. Gr 1 DD. Normal PAP. Aortic Sclerosis.;; b) Normal LV size and function with vigorous EF 65 to 70%.  GR 1 DD.  Severe LA dilation (suggestive of probably worsening: GR 1 DD).   TRANSTHORACIC ECHOCARDIOGRAM  03/18/2020   EF 70-75%.  Hyperdynamic.  GR 1 DD.  No R WMA.  Mild LA dilation.  Mild aortic valve sclerosis but no stenosis.-No change from prior echo   TUBAL LIGATION     Patient Active Problem List   Diagnosis Date Noted   Anxiety 11/04/2021   Avascular necrosis ([place]) 11/04/2021   Chronic interstitial cystitis 11/04/2021   Diabetic peripheral neuropathy associated with type 2 diabetes mellitus ([place]) 11/04/2021   Polyneuropathy due to type 2 diabetes mellitus ([place]) 11/04/2021   Eczema 11/04/2021   History of urinary tract infection 11/04/2021   Neuropathy 11/04/2021   Other long term (current) drug therapy 11/04/2021   Peptic ulcer disease 11/04/2021   Presence of aortocoronary bypass graft 11/04/2021   Skin sensation disturbance 11/04/2021   Vitamin D deficiency  11/04/2021   Gastroesophageal reflux disease 11/04/2021   Rheumatoid arthritis ([place]) 11/04/2021   Peripheral vascular disease ([place]) 11/04/2021   Type 2 diabetes mellitus with other specified complication ([place]) 66/05/3015   Chronic diastolic heart failure ([place]) 01/07/2021   Status post total hip replacement, right 12/04/2020   Status post total replacement of left hip 11/21/2020   Near syncope 07/10/2020   Hx of CABG 07/10/2020   Preoperative cardiovascular examination 04/15/2020   Chest pain 03/18/2020   Unilateral primary osteoarthritis, left hip 04/03/2019   History of right hip replacement 04/03/2019   Status post total replacement of right hip 10/12/2018   Unilateral primary osteoarthritis, right hip 09/12/2018   Degenerative joint disease 05/30/2018   Anal fissure 11/10/2017   Rectal bleeding 11/10/2017   Rectal pain 11/10/2017   Inflammatory arthritis 10/13/2017   Latent tuberculosis by blood test 10/13/2017   Chronic diastolic CHF (congestive heart failure), NYHA class 2 ([place]) 08/11/2017   Fatigue due to treatment 01/09/2017   NSTEMI (non-ST elevated myocardial infarction) ([place]) 11/01/2016   Diabetes mellitus ([place]) 10/24/2015   Non-insulin dependent type 2 diabetes mellitus ([place]) 10/24/2015   Angina, class I ([place]) 04/22/2015   DOE (dyspnea on exertion) 04/22/2015   Bilateral leg edema 04/22/2015   Accumulation of fluid in tissues 03/21/2014   Vertigo, central 01/02/2014   Vertigo 01/02/2014   Limb pain- echymosis, pain Lt radial artery after cath 12/11/2013   Dyslipidemia, goal LDL below 70 05/17/2013   Essential hypertension 05/17/2013   Claudication ([place]) 05/16/2013   PTA/ Stent Rt popliteal and Rt peroneal artery 05/16/13 05/16/2013   PAD,severe calcified pop and tibial peroneal trunk disease bilateraly    ABDOMINAL PAIN RIGHT LOWER QUADRANT 10/07/2010   CVA-STROKE 03/04/2010   BARRETT'S ESOPHAGUS 03/04/2010   FECAL INCONTINENCE 03/04/2010   DM 01/14/2010   Anemia  of chronic disease 01/14/2010   Atherosclerotic heart disease of native coronary artery with angina pectoris ([place]) 01/14/2010   DIVERTICULOSIS OF COLON 01/14/2010   PERSONAL HISTORY OF COLONIC POLYPS 01/14/2010   SHELLFISH ALLERGY 01/14/2010    PCP: Janie Morning, DO   REFERRING PROVIDER: Mcarthur Rossetti*   REFERRING DIAG: W10.93 (ICD-10-CM) - It band syndrome, left  ONSET DATE: Dec 2021  THERAPY DIAG:  Pain in left leg  Pain in left hip  Other low back pain  Muscle weakness (generalized)  Pain in right hip  Difficulty in walking, not elsewhere classified  PERTINENT HISTORY:  Left hip pain with  IT band syndrome; troch bursitis and previous THA 2021 PMH: CAD s/p CABG, CHF, PAD with claudication, DM with neuropathy, HTN, HLD, NSTEMI, RA, bil THA, hx back surgery  PRECAUTIONS: None  SUBJECTIVE: Pt indicated doing better.  Pt indicated she still has complaints but she feels like it doesn't hurt as bad.   PAIN:  NPRS scale: at current 3/10 Pain location: Back, Rt hip mainly Pain description: constant pain Aggravating factors: increased activity  Relieving factors: heat ice, rest, medicine (not much)   OBJECTIVE:    DIAGNOSTIC FINDINGS: 04/06/2022: Previous xray: An AP pelvis and lateral left hip shows well-seated bilateral total hip  arthroplasties with no complicating features.   PATIENT SURVEYS:  04/06/2022 FOTO intake:  53 predicted:  59   COGNITION: 04/06/2022 Overall cognitive status: Within functional limits for tasks assessed                               POSTURE:   04/06/2022 : Reduced lumbar lordosis c mild forward trunk lean in standing positioning c mild decrease of stance on Lt leg   PALPATION:  04/06/2022: tenderness noted throughout Lt lateral thigh and posterior/lateral hip c Trigger points noted in glute med, max, lateral vastus lateralis on Lt.  Mild tenderness to touch in Rt lumbar paraspinals, QL   LUMBAR AROM: 04/27/2022:  Lumbar  extension 50% WFL today c ERP lumbar noted.    04/06/2022:   Extension: 25 % WFL c end range pain on Rt lumbar/hip.  Repeated x 5 in standing: improved to 75% WFL c continued ERP in Rt lumbar/hip (no centralization/perhiperialization)   LE ROM:    ROM Right 04/06/2022 Left 04/06/2022 Right 05/04/2022 Left 05/04/2022  Hip flexion   100 PROM in supine  101 PROM in supine  Hip extension        Hip abduction        Hip adduction        Hip internal rotation 30 PROM in supine 90 deg flexion 25 PROM in supine 90 deg flexion 30 PROM in supine 90 deg flexion 23 PROM in supine 90 deg flexion  Hip external rotation 42 PROM in supine 90 deg flexion 35 PROM in supine 90 deg flexion 45 PROM in supine 90 deg flexion 33 PROM in supine 90 deg flexion  Knee flexion        Knee extension        Ankle dorsiflexion        Ankle plantarflexion        Ankle inversion        Ankle eversion         (Blank rows = not tested) 04/06/2022: pain limited end range in all hip movements.    LE MMT:   MMT Right 04/06/2022 Left 04/06/2022  Hip flexion 4/5 4/5  Hip extension      Hip abduction   3/5 c Lt leg pain  Hip adduction      Hip internal rotation      Hip external rotation      Knee flexion 5/5 5/5  Knee extension 4/5 c back pain 5/5  Ankle dorsiflexion      Ankle plantarflexion      Ankle inversion      Ankle eversion       (Blank rows = not tested)   LOWER EXTREMITY SPECIAL TESTS:  04/06/2022 : (+) FABER bilateral (Lt for hip pain,  Rt for back pain)   FUNCTIONAL TESTS:  05/04/2022: TUG: c SPC: 14.88 seconds  04/06/2022        18 inch chair sit to stand: no UE on 2nd attempt c pain noted                         TUG :  c SPC 14.92 seconds                                                 Lt SLS: 6 seconds, Rt SLS 7 seconds GAIT: 04/06/2022 SPC in Rt UE household distances within clinic c wider base of support noted and mild decrease of stance on Lt leg and limited hip extension bilateral      TODAY'S TREATMENT: 05/04/2022:             Therex:                        Standing tband rows green 2 x 10 c scap retraction hold 2 seconds   Standing lumbar extension x 5 AROM    Supine SKC 15 sec x3 bilateral   Supine bridge 2 x 10   Supine figure 4 push away 15 sec x 2 bilateral   Supine figure 4 pull towards 15 sec x 2 bilateral  04/29/2022:             Therex:                         Nustep lvl 5 8 mins UE/LE   Standing lumbar extension 2 x 5 AROM    Supine hooklying hip add ball squeeze 5 sec hold x 10   Supine green band clam shell x20 bilateral   Supine bridge 2 x 10   Supine figure 4 push away 15 sec x 3 bilateral    04/27/2022:             Therex:                         Nustep lvl 5 6 mins UE/LE   Standing lumbar extension x 5 AROM    Extended time in review of existing HEP c printout provided again with verbal and visual review of exercises.  No attempts at intervention today secondary to Pt complaints of nausea at times while sitting and resting midway through visit.   PATIENT EDUCATION:  04/27/2022 Education details: HEP, POC Person educated: Patient Education method: Explanation, Demonstration, Verbal cues, and Handouts Education comprehension: verbalized understanding, verbal cues required, and needs further education     HOME EXERCISE PROGRAM: 04/27/2022 Access Code: [email or web address] URL: [email or web address] Date: 04/27/2022 Prepared by:    Exercises - Supine Bridge  - 2 x daily - 7 x weekly - 3 sets - 10 reps - 2 hold - Supine Piriformis Stretch with Foot on Ground  - 2 x daily - 7 x weekly - 1 sets - 5 reps - 15-20 hold - Sit to Stand  - 1 x daily - 7 x weekly - 3 sets - 10 reps - Supine Lower Trunk Rotation  - 2-3 x daily - 7 x weekly - 1 sets - 3-5 reps - 15 hold - Hooklying Clamshell with Resistance  -   2 x daily - 7 x weekly - 1-2 sets - 10 reps - Standing Lumbar Extension with Counter  - 3-5 x daily - 7 x weekly - 1 sets - 5-10  reps   ASSESSMENT:   CLINICAL IMPRESSION: Today was first day of subjective report of reduced symptoms in comparison to other days.  Impairments still noted in bilateral hip mobility and strength which impact functional movement pattern and difficulty level.  Continued skilled PT services indicated at this time.   Arrival late for appointment reduced treatment time today.      OBJECTIVE IMPAIRMENTS Abnormal gait, decreased activity tolerance, decreased balance, decreased coordination, decreased endurance, decreased mobility, difficulty walking, decreased ROM, decreased strength, hypomobility, increased fascial restrictions, impaired perceived functional ability, increased muscle spasms, impaired flexibility, improper body mechanics, postural dysfunction, and pain.    ACTIVITY LIMITATIONS cleaning, community activity, meal prep, and shopping.    PERSONAL FACTORS Time since onset of injury/illness/exacerbation, PMH: CAD s/p CABG, CHF, PAD with claudication, DM with neuropathy, HTN, HLD, NSTEMI, RA, bil THA, hx back surgery are also affecting patient's functional outcome.      REHAB POTENTIAL: Fair     CLINICAL DECISION MAKING: Evolving/moderate complexity   EVALUATION COMPLEXITY: Medium     GOALS: Goals reviewed with patient? Yes   Short term PT Goals (target date for Short term goals are 3 weeks 04/27/2022) Patient will demonstrate independent use of home exercise program to maintain progress from in clinic treatments. Goal status: not met - assessed 04/27/2022 (cues required)    Long term PT goals (target dates for all long term goals are 10 weeks  06/15/2022 )   1. Patient will demonstrate/report pain at worst less than or equal to 2/10 to facilitate minimal limitation in daily activity secondary to pain symptoms. Goal status: on going - assessed 04/27/2022   2. Patient will demonstrate independent use of home exercise program to facilitate ability to maintain/progress functional  gains from skilled physical therapy services. Goal status: on going - assessed 04/27/2022   3. Patient will demonstrate FOTO outcome > or = 59 % to indicate reduced disability due to condition. Goal status: on going - assessed 04/27/2022   4.  Patient will demonstrate lumbar extension 100 % WFL s symptoms to facilitate upright standing, walking posture at PLOF s limitation. Goal status: on going - assessed 04/27/2022   5.  Patient will demonstrate bilateral hip flexion MMT 5/5, hip abduction > or = 4/5, knee MMT 5/5 throughout to facilitate ability to perform usual standing, walking, stairs at PLOF s limitation due to symptoms.   Goal status: on going - assessed 04/27/2022   6.  Patient will demonstrate bilateral SLS > or = 10 seconds to facilitate stability in ambulation.   Goal status: on going - assessed 04/27/2022   7.  Patient will demonstrate TUG < 14 seconds independently to indicate reduce fall risk.  a.  Goal Status: on going - assessed 04/27/2022     PLAN: PT FREQUENCY: 1-2x/week   PT DURATION: 10 weeks   PLANNED INTERVENTIONS: Therapeutic exercises, Therapeutic activity, Neuro Muscular re-education, Balance training, Gait training, Patient/Family education, Joint mobilization, Stair training, DME instructions, Dry Needling, Electrical stimulation, Cryotherapy, Moist heat, Taping, Ultrasound, Ionotophoresis 4mg/ml Dexamethasone, and Manual therapy.  All included unless contraindicated   PLAN FOR NEXT SESSION:  Possible sit to stands/leg press for LE strength in addition to current plan.     , PT, DPT, OCS, ATC 05/04/22  2:27 PM     

## 2022-05-06 ENCOUNTER — Encounter: Payer: Self-pay | Admitting: Rehabilitative and Restorative Service Providers"

## 2022-05-06 ENCOUNTER — Ambulatory Visit: Payer: Medicare Other | Admitting: Rehabilitative and Restorative Service Providers"

## 2022-05-06 DIAGNOSIS — M6281 Muscle weakness (generalized): Secondary | ICD-10-CM | POA: Diagnosis not present

## 2022-05-06 DIAGNOSIS — M79605 Pain in left leg: Secondary | ICD-10-CM | POA: Diagnosis not present

## 2022-05-06 DIAGNOSIS — M25552 Pain in left hip: Secondary | ICD-10-CM | POA: Diagnosis not present

## 2022-05-06 DIAGNOSIS — M5459 Other low back pain: Secondary | ICD-10-CM | POA: Diagnosis not present

## 2022-05-06 DIAGNOSIS — M25551 Pain in right hip: Secondary | ICD-10-CM

## 2022-05-06 DIAGNOSIS — R262 Difficulty in walking, not elsewhere classified: Secondary | ICD-10-CM

## 2022-05-06 NOTE — Therapy (Signed)
OUTPATIENT PHYSICAL THERAPY TREATMENT NOTE   Patient Name: KJIRSTEN BLOODGOOD MRN: [id number] DOB:11/20/1933, 88 y.o., female Today's Date: 05/06/2022    END OF SESSION:   PT End of Session - 05/06/22 1343     Visit Number 6    Number of Visits 20    Date for PT Re-Evaluation 06/15/22    Authorization Type [employer] Medicare 20% coinsurance    PT Start Time 1340    PT Stop Time 1420    PT Time Calculation (min) 40 min    Activity Tolerance Patient limited by pain;Patient limited by fatigue    Behavior During Therapy [facility] for tasks assessed/performed                Past Medical History:  Diagnosis Date   Allergy to IVP dye    Angina, class I ([place]) 04/22/2015   Atherosclerotic heart disease native coronary artery w/angina pectoris ([place]) 1992   a. 1992 s/p PTCA of LAD;  b. 1998 CABG x 3; c. 07/2001 Cath: Sev LM/LAD/LCX dzs, 3/3 patent grafts; d. 11/2013 Cath: stable graft anatomy; e. 10/2016 Cath: native 3VD, VG->OM nl, LIMA->LAD nl, VG->Diag 55.   Atherosclerotic heart disease of native coronary artery with angina pectoris ([place]) 01/14/2010   Qualifier: Diagnosis of  By: Deatra Ina MD, Sandy Salaam    Atrial septal aneurysm    Barrett's esophagus 03/04/2010   Qualifier: Diagnosis of  By: Shane Crutch, Amy S    Bilateral leg edema 04/22/2015   Bradycardia, mild briefly to the 40s, asymptomatic 05/16/2013   Carotid arterial disease ([place])    a. 05/2014 Carotid U/S: No signif bilat ICA stenosis, >60% L ECA.   Chronic anemia    Chronic diastolic CHF (congestive heart failure) ([place])    a. 10/2016 Echo: Ef 55-60%, no rwma, Gr1 DD, triv AI, PASP 69mHg, atrial septal aneurysm.   Chronic diastolic CHF (congestive heart failure), NYHA class 2 ([place] 08/11/2017   Claudication ([place] 05/16/2013   Peripheral arterial occlusive disease with lifestyle limiting claudication    Degenerative joint disease 05/30/2018   DIVERTICULOSIS OF COLON 01/14/2010   Qualifier: Diagnosis of  By: KDeatra InaMD, RSandy Salaam    DM (diabetes mellitus), type 2 with peripheral vascular complications ([place]    On Oral medications.   DOE (dyspnea on exertion) 04/22/2015   Essential hypertension    FECAL INCONTINENCE 03/04/2010   Qualifier: Diagnosis of  By: ETrellis PaganiniPA-c, Amy S    GERD (gastroesophageal reflux disease)    Hyperlipidemia with target LDL less than 70    Inflammatory arthritis 10/13/2017   Neuropathy    Non-insulin dependent type 2 diabetes mellitus ([place] 10/24/2015   NSTEMI (non-ST elevated myocardial infarction) ([place] 11/01/2016   PAD (peripheral artery disease) ([place] 05/2013; 09/2013   a. severe calcified POP-1 & TP Trunk Dz bilat;  b . 05/2013 Diamondback Rot Athrectomy (R POP) --> PTA R Pop, DES to R Peroneal;  c. 09/2013 PTA/Stenting of L Pop ([name]- retrograde access);  d. 04/2014 ABI's: R/L: 1.1/1.1.   Preoperative cardiovascular examination 04/15/2020   PUD (peptic ulcer disease)    Rheumatoid arthritis (HB[place]    S/P CABG x 3 1998   LIMA-LAD, SVG-OM, SVG-D1   Severe claudication ([place] 05/2013   Referred for Peripheral Angio (Dr. BGwenlyn Found--> Dr. GBrunetta Jeansin CGypsum   [place]02/01/2010   Qualifier: Diagnosis of  By: NHarlon DittyCMA (AHarris[place], [name]     Status post total replacement of right hip 10/12/2018  Vertigo 01/02/2014   Past Surgical History:  Procedure Laterality Date   ATHERECTOMY Right 05/15/2013   Procedure: ATHERECTOMY;  Surgeon: Lorretta Harp, MD;  Location: [facility] CATH LAB;  Service: Cardiovascular;  Laterality: Right;  popliteal   BACK SURGERY     CARDIAC CATHETERIZATION  07/17/01   2 v CAD with LM, circ, LAD, obtuse diag, mod stenosis of diag vein graft , mild stenosis of marg vein graft, nl EF, medical treatment   CARDIAC CATHETERIZATION  11/2013   Widely patent LIMA-LAD & SVG-OM with mild progression of proximal stenosis ~40-50% in SVG-D1; Widely patent native RCA with known severe native LCA disease   CARDIAC CATHETERIZATION N/A 11/01/2016   Procedure: Left  Heart Cath and Cors/Grafts Angiography;  Surgeon: [name], MD;  Location: [facility] CV LAB;  Service: Cardiovascular: Hemodynamics: High LVEDP!.  Cors/Grafts: LM 55%, o-p LAD 100% then after D1 -> LIMA-LAD patent, SVG-Diag ~55%. small RI wiht ~70% ostial. O-p Cx 80% - pCx 70% --> SVG-bifurcating OM patent. -- med Rx   CORONARY ANGIOPLASTY  1992   PCI to LAD   CORONARY ARTERY BYPASS GRAFT  1998   LIMA-LAD, SVG-diagonal (none roughly 50% stenosis), SVG-OM   LEFT HEART CATHETERIZATION WITH CORONARY ANGIOGRAM N/A 11/27/2013   Procedure: LEFT HEART CATHETERIZATION WITH CORONARY ANGIOGRAM;  Surgeon: [name], MD;  Location: [facility] CATH LAB;  Service: Cardiovascular;  Laterality: N/A;   LOW EXTREMITY DOPPLERS/ABI  10/2016   Patent right SFA, popliteal and peroneal tendons. Patent left SFA and popliteal stent. 30-50% stenosis in the right common femoral and popliteal arteries, 50-74% stenosis in left profunda femoris artery. --> 1 year follow-up.  RABI - 1.2, LABI 1.1   LOWER EXTREMITY ANGIOGRAM N/A 02/22/2013   Procedure: LOWER EXTREMITY ANGIOGRAM;  Surgeon: Lorretta Harp, MD;  Location: [facility] CATH LAB;  Service: Cardiovascular;  Laterality: N/A;   LOWER EXTREMITY ANGIOGRAM N/A 07/20/2013   Procedure: LOWER EXTREMITY ANGIOGRAM;  Surgeon: Lorretta Harp, MD;  Location: [facility] CATH LAB;  Service: Cardiovascular;  Laterality: N/A;   NM MYOVIEW LTD  04/03/2013; 5/26'/2016   Lexiscan: a)  Apical perfusion defect with no ischemia. Consider prior infarct versus breast attenuation.;; b) 5/'16: LOW RISK, Nl EF ~55-65%, No Ischemia /Infarction   NM MYOVIEW LTD  10/'19; 4/'21   a) LOW RISK.  EF 55%.  No ischemia or infarction. b) EF estimated 81%.  Suboptimal images.  May be subtle inferior ischemia-initially read by radiology as intermediate risk, however overread by cardiology to be mild low risk.Marland Kitchen   PERCUTANEOUS STENT INTERVENTION Right 05/15/2013   Procedure: PERCUTANEOUS STENT INTERVENTION;  Surgeon:  Lorretta Harp, MD;  Location[facility] CATH LAB;  Service: Cardiovascular;  Laterality: Right;  tibioperoneal trunk   Peroneal Artery Stent  05/15/13   Diamondback rot. atherectomy of high grade segmental popliteal stenosis then [place] balloonPTA; then angiosculpt PTA of the prox peroneal with stent-Xpedition    pv angio  07/20/13   high-grade calcified popliteal disease with one-vessel runoff via a small anterior tibial artery that has proximal disease as well, no intervention   TOTAL HIP ARTHROPLASTY Right 10/12/2018   Procedure: RIGHT TOTAL HIP ARTHROPLASTY ANTERIOR APPROACH;  Surgeon: Mcarthur Rossetti, MD;  Location: [facility] ORS;  Service: Orthopedics;  Laterality: Right;   TOTAL HIP ARTHROPLASTY Left 11/21/2020   Procedure: LEFT TOTAL HIP ARTHROPLASTY ANTERIOR APPROACH;  Surgeon: Mcarthur Rossetti, MD;  Location: [facility] ORS;  Service: Orthopedics;  Laterality: Left;   TRANSTHORACIC ECHOCARDIOGRAM  11/'17; 10/19  a) EF 55-60%. Gr 1 DD. Normal PAP. Aortic Sclerosis.;; b) Normal LV size and function with vigorous EF 65 to 70%.  GR 1 DD.  Severe LA dilation (suggestive of probably worsening: GR 1 DD).   TRANSTHORACIC ECHOCARDIOGRAM  03/18/2020   EF 70-75%.  Hyperdynamic.  GR 1 DD.  No R WMA.  Mild LA dilation.  Mild aortic valve sclerosis but no stenosis.-No change from prior echo   TUBAL LIGATION     Patient Active Problem List   Diagnosis Date Noted   Anxiety 11/04/2021   Avascular necrosis ([place]) 11/04/2021   Chronic interstitial cystitis 11/04/2021   Diabetic peripheral neuropathy associated with type 2 diabetes mellitus ([place]) 11/04/2021   Polyneuropathy due to type 2 diabetes mellitus ([place]) 11/04/2021   Eczema 11/04/2021   History of urinary tract infection 11/04/2021   Neuropathy 11/04/2021   Other long term (current) drug therapy 11/04/2021   Peptic ulcer disease 11/04/2021   Presence of aortocoronary bypass graft 11/04/2021   Skin sensation disturbance 11/04/2021   Vitamin D  deficiency 11/04/2021   Gastroesophageal reflux disease 11/04/2021   Rheumatoid arthritis ([place]) 11/04/2021   Peripheral vascular disease ([place]) 11/04/2021   Type 2 diabetes mellitus with other specified complication ([place]) 27/78/2423   Chronic diastolic heart failure ([place]) 01/07/2021   Status post total hip replacement, right 12/04/2020   Status post total replacement of left hip 11/21/2020   Near syncope 07/10/2020   Hx of CABG 07/10/2020   Preoperative cardiovascular examination 04/15/2020   Chest pain 03/18/2020   Unilateral primary osteoarthritis, left hip 04/03/2019   History of right hip replacement 04/03/2019   Status post total replacement of right hip 10/12/2018   Unilateral primary osteoarthritis, right hip 09/12/2018   Degenerative joint disease 05/30/2018   Anal fissure 11/10/2017   Rectal bleeding 11/10/2017   Rectal pain 11/10/2017   Inflammatory arthritis 10/13/2017   Latent tuberculosis by blood test 10/13/2017   Chronic diastolic CHF (congestive heart failure), NYHA class 2 ([place]) 08/11/2017   Fatigue due to treatment 01/09/2017   NSTEMI (non-ST elevated myocardial infarction) ([place]) 11/01/2016   Diabetes mellitus ([place]) 10/24/2015   Non-insulin dependent type 2 diabetes mellitus ([place]) 10/24/2015   Angina, class I ([place]) 04/22/2015   DOE (dyspnea on exertion) 04/22/2015   Bilateral leg edema 04/22/2015   Accumulation of fluid in tissues 03/21/2014   Vertigo, central 01/02/2014   Vertigo 01/02/2014   Limb pain- echymosis, pain Lt radial artery after cath 12/11/2013   Dyslipidemia, goal LDL below 70 05/17/2013   Essential hypertension 05/17/2013   Claudication ([place]) 05/16/2013   PTA/ Stent Rt popliteal and Rt peroneal artery 05/16/13 05/16/2013   PAD,severe calcified pop and tibial peroneal trunk disease bilateraly    ABDOMINAL PAIN RIGHT LOWER QUADRANT 10/07/2010   CVA-STROKE 03/04/2010   BARRETT'S ESOPHAGUS 03/04/2010   FECAL INCONTINENCE 03/04/2010   DM 01/14/2010    Anemia of chronic disease 01/14/2010   Atherosclerotic heart disease of native coronary artery with angina pectoris ([place]) 01/14/2010   DIVERTICULOSIS OF COLON 01/14/2010   PERSONAL HISTORY OF COLONIC POLYPS 01/14/2010   SHELLFISH ALLERGY 01/14/2010    PCP: Janie Morning, DO   REFERRING PROVIDER: Mcarthur Rossetti*   REFERRING DIAG: N36.14 (ICD-10-CM) - It band syndrome, left  ONSET DATE: Dec 2021  THERAPY DIAG:  Pain in left leg  Pain in left hip  Other low back pain  Muscle weakness (generalized)  Pain in right hip  Difficulty in walking, not elsewhere classified  PERTINENT HISTORY:  Left hip pain with  IT band syndrome; troch bursitis and previous THA 2021 PMH: CAD s/p CABG, CHF, PAD with claudication, DM with neuropathy, HTN, HLD, NSTEMI, RA, bil THA, hx back surgery  PRECAUTIONS: None  SUBJECTIVE:  Pt indicated pain at 5/10 or so today at most.  Pt indicated not as good as last day coming in but still some better.   PAIN:  NPRS scale: at worst 5/10 Pain location: Back, Rt hip mainly Pain description: constant pain Aggravating factors: increased activity  Relieving factors: heat ice, rest, medicine (not much)   OBJECTIVE:    DIAGNOSTIC FINDINGS: 04/06/2022: Previous xray: An AP pelvis and lateral left hip shows well-seated bilateral total hip  arthroplasties with no complicating features.   PATIENT SURVEYS:  04/06/2022 FOTO intake:  53 predicted:  59   COGNITION: 04/06/2022 Overall cognitive status: Within functional limits for tasks assessed                               POSTURE:   04/06/2022 : Reduced lumbar lordosis c mild forward trunk lean in standing positioning c mild decrease of stance on Lt leg   PALPATION:  04/06/2022: tenderness noted throughout Lt lateral thigh and posterior/lateral hip c Trigger points noted in glute med, max, lateral vastus lateralis on Lt.  Mild tenderness to touch in Rt lumbar paraspinals, QL   LUMBAR  AROM: 04/27/2022:  Lumbar extension 50% WFL today c ERP lumbar noted.    04/06/2022:   Extension: 25 % WFL c end range pain on Rt lumbar/hip.  Repeated x 5 in standing: improved to 75% WFL c continued ERP in Rt lumbar/hip (no centralization/perhiperialization)   LE ROM:    ROM Right 04/06/2022 Left 04/06/2022 Right 05/04/2022 Left 05/04/2022  Hip flexion   100 PROM in supine  101 PROM in supine  Hip extension        Hip abduction        Hip adduction        Hip internal rotation 30 PROM in supine 90 deg flexion 25 PROM in supine 90 deg flexion 30 PROM in supine 90 deg flexion 23 PROM in supine 90 deg flexion  Hip external rotation 42 PROM in supine 90 deg flexion 35 PROM in supine 90 deg flexion 45 PROM in supine 90 deg flexion 33 PROM in supine 90 deg flexion  Knee flexion        Knee extension        Ankle dorsiflexion        Ankle plantarflexion        Ankle inversion        Ankle eversion         (Blank rows = not tested) 04/06/2022: pain limited end range in all hip movements.    LE MMT:   MMT Right 04/06/2022 Left 04/06/2022  Hip flexion 4/5 4/5  Hip extension      Hip abduction   3/5 c Lt leg pain  Hip adduction      Hip internal rotation      Hip external rotation      Knee flexion 5/5 5/5  Knee extension 4/5 c back pain 5/5  Ankle dorsiflexion      Ankle plantarflexion      Ankle inversion      Ankle eversion       (Blank rows = not tested)   LOWER EXTREMITY SPECIAL TESTS:  04/06/2022 : (+)  FABER bilateral (Lt for hip pain, Rt for back pain)   FUNCTIONAL TESTS:  05/04/2022: TUG: c SPC: 14.88 seconds  04/06/2022        18 inch chair sit to stand: no UE on 2nd attempt c pain noted                         TUG :  c SPC 14.92 seconds                                                 Lt SLS: 6 seconds, Rt SLS 7 seconds GAIT: 04/06/2022 SPC in Rt UE household distances within clinic c wider base of support noted and mild decrease of stance on Lt leg and limited hip  extension bilateral     TODAY'S TREATMENT: 05/04/2022:             Therex:                        Standing lumbar extension x 5 AROM    Supine bridge 2 x 10   Supine lumbar trunk rotation 15 sec x 3 bilateral   Seated on foam marching x 10 bilateral, marching c ipsilateral arm lift x 10 bilateral   Seated on foam red pball press down hold UE 5 sec x 10   Seated on foam red pball hip adductor squeeze c UE in 90 deg flexion hold 5 sec x 10   Double leg press 50 lbs 2 x 10, single leg 25 lbs x 10, performed bilateral  04/29/2022:             Therex:                         Nustep lvl 5 8 mins UE/LE   Standing lumbar extension 2 x 5 AROM    Supine hooklying hip add ball squeeze 5 sec hold x 10   Supine green band clam shell x20 bilateral   Supine bridge 2 x 10   Supine figure 4 push away 15 sec x 3 bilateral    04/27/2022:             Therex:                         Nustep lvl 5 6 mins UE/LE   Standing lumbar extension x 5 AROM    Extended time in review of existing HEP c printout provided again with verbal and visual review of exercises.  No attempts at intervention today secondary to Pt complaints of nausea at times while sitting and resting midway through visit.   PATIENT EDUCATION:  04/27/2022 Education details: HEP, POC Person educated: Patient Education method: Explanation, Demonstration, Verbal cues, and Handouts Education comprehension: verbalized understanding, verbal cues required, and needs further education     HOME EXERCISE PROGRAM: 04/27/2022 Access Code: [email or web address] URL: [email or web address] Date: 04/27/2022 Prepared by: Scot Jun  Exercises - Supine Bridge  - 2 x daily - 7 x weekly - 3 sets - 10 reps - 2 hold - Supine Piriformis Stretch with Foot on Ground  - 2 x daily - 7 x weekly - 1 sets - 5 reps - 15-20 hold - Sit to Stand  - 1  x daily - 7 x weekly - 3 sets - 10 reps - Supine Lower Trunk Rotation  - 2-3 x daily - 7 x weekly - 1 sets - 3-5  reps - 15 hold - Hooklying Clamshell with Resistance  - 2 x daily - 7 x weekly - 1-2 sets - 10 reps - Standing Lumbar Extension with Counter  - 3-5 x daily - 7 x weekly - 1 sets - 5-10 reps   ASSESSMENT:   CLINICAL IMPRESSION: Quality of lumbar extension mobility improving each day in last week or so.  Attempts for progressive functional LE strength limited by complaints in knees (requiring reduced weight/reps).  Core stability included today in sitting to promote functional stability.  Continued skilled PT services indicated at this time.      OBJECTIVE IMPAIRMENTS Abnormal gait, decreased activity tolerance, decreased balance, decreased coordination, decreased endurance, decreased mobility, difficulty walking, decreased ROM, decreased strength, hypomobility, increased fascial restrictions, impaired perceived functional ability, increased muscle spasms, impaired flexibility, improper body mechanics, postural dysfunction, and pain.    ACTIVITY LIMITATIONS cleaning, community activity, meal prep, and shopping.    PERSONAL FACTORS Time since onset of injury/illness/exacerbation, PMH: CAD s/p CABG, CHF, PAD with claudication, DM with neuropathy, HTN, HLD, NSTEMI, RA, bil THA, hx back surgery are also affecting patient's functional outcome.      REHAB POTENTIAL: Fair     CLINICAL DECISION MAKING: Evolving/moderate complexity   EVALUATION COMPLEXITY: Medium     GOALS: Goals reviewed with patient? Yes   Short term PT Goals (target date for Short term goals are 3 weeks 04/27/2022) Patient will demonstrate independent use of home exercise program to maintain progress from in clinic treatments. Goal status: not met - assessed 04/27/2022 (cues required)    Long term PT goals (target dates for all long term goals are 10 weeks  06/15/2022 )   1. Patient will demonstrate/report pain at worst less than or equal to 2/10 to facilitate minimal limitation in daily activity secondary to pain  symptoms. Goal status: on going - assessed 04/27/2022   2. Patient will demonstrate independent use of home exercise program to facilitate ability to maintain/progress functional gains from skilled physical therapy services. Goal status: on going - assessed 04/27/2022   3. Patient will demonstrate FOTO outcome > or = 59 % to indicate reduced disability due to condition. Goal status: on going - assessed 04/27/2022   4.  Patient will demonstrate lumbar extension 100 % WFL s symptoms to facilitate upright standing, walking posture at PLOF s limitation. Goal status: on going - assessed 04/27/2022   5.  Patient will demonstrate bilateral hip flexion MMT 5/5, hip abduction > or = 4/5, knee MMT 5/5 throughout to facilitate ability to perform usual standing, walking, stairs at PLOF s limitation due to symptoms.   Goal status: on going - assessed 04/27/2022   6.  Patient will demonstrate bilateral SLS > or = 10 seconds to facilitate stability in ambulation.   Goal status: on going - assessed 04/27/2022   7.  Patient will demonstrate TUG < 14 seconds independently to indicate reduce fall risk.  a.  Goal Status: on going - assessed 04/27/2022     PLAN: PT FREQUENCY: 1-2x/week   PT DURATION: 10 weeks   PLANNED INTERVENTIONS: Therapeutic exercises, Therapeutic activity, Neuro Muscular re-education, Balance training, Gait training, Patient/Family education, Joint mobilization, Stair training, DME instructions, Dry Needling, Electrical stimulation, Cryotherapy, Moist heat, Taping, Ultrasound, Ionotophoresis 80m/ml Dexamethasone, and Manual therapy.  All included unless  contraindicated   PLAN FOR NEXT SESSION:  LE strengthening as knee complaints allow.  Continue progressive mobility improvements.    Scot Jun, PT, DPT, OCS, ATC 05/06/22  2:16 PM

## 2022-05-12 ENCOUNTER — Encounter: Payer: Self-pay | Admitting: Orthopaedic Surgery

## 2022-05-12 ENCOUNTER — Ambulatory Visit: Payer: Medicare Other | Admitting: Orthopaedic Surgery

## 2022-05-12 DIAGNOSIS — M545 Low back pain, unspecified: Secondary | ICD-10-CM

## 2022-05-12 NOTE — Progress Notes (Signed)
The patient is an 88 year old female well-known to me.  I been following her for low back pain to the right side.  This seems to be more facet joint mediated and definitely arthritic in nature.  She has been going to physical therapy and that is helped.  She does feel like she is slightly better.  She is ambulate with a cane.  She is on Plavix.  I have replaced both of her hips.  She seems to be mobilizing better overall and I believe therapy is helping her.  The pain seems to be to the right side of her lower lumbar spine it does radiate just a little bit into the SI joint area of the pelvis.  Her hip exam is normal bilaterally.  She has negative straight leg raise bilaterally.  We will continue conservative treatment for now since she is doing better.  She even pointed out that her left side IT band and trochanteric pain is getting less.  I can see her back in 3 months to see how she is doing overall but no x-rays are needed.  However, if her back pain worsens in any way or she wants to consider being set up for any type of injection in her back she would need to let us know.

## 2022-05-20 ENCOUNTER — Encounter: Payer: Medicare Other | Admitting: Rehabilitative and Restorative Service Providers"

## 2022-05-25 NOTE — Progress Notes (Deleted)
Cardiology Office Note:    Date:  05/25/2022   ID:  [name], DOB 13-Feb-1933, MRN [id number]  PCP:  [name], [name] Cardiologist: Monica Hew, MD   Reason for visit: 3 month follow-up  History of Present Illness:    [name] is a 88 y.o. female with a hx of  CAD status post CABG in 1998, PAD with intervention in 2014.  Low risk stress test in 2021.  Previously admitted for syncopal episodes thought secondary to dehydration.  Lasix was discontinued.   She saw our Pharm.D. in November 2022 for hypertension management.  Amlodipine discontinued secondary to edema.  She started hydralazine 25 mg twice a day.  Not started on ACE/ARB or diuretic 2/2 dehydration, low sodium and high K on labs.  I saw her on 02/04/22 when she complained of chest tightness. She also complained of leg achiness with activity, left > right.  With decreased exercise tolerance, stress test ordered & was low risk.  We switched hydralazine to HCTZ to help with her lower extremity edema.  We started HCTZ since her electrolytes and renal function normalized.  She saw [name] 02/2022 and had BP 182/84, repeat 148/80 prompting this 3 month follow-up. ***  She does not notice chest pain when she is walking or doing her chores.  She does not remember her angina symptoms before her CABG.  She does notice that she tires more quickly.  When she is doing activities, her legs do ache even when washing the dishes.  She has more discomfort in her left leg than her right.  She feels a numb ache, it is sore and tender.  She has lower extremity edema in the lower legs.  She notices that any scratches take longer to heal as a diabetic.  She has no active sores.  She has compression stockings but has not been wearing them regularly.  She sometimes notices toe cramping.   She notices when she is in a hurry she can feel her heart beating faster.  She tries to breathe to calm it down.  She does not notice  palpitations at rest.  She states her breathing is okay.  No PND.    She does not lay flat secondary to vertigo.  She denies any diaphoresis or syncope.  CAD  Precordial pain, Decreased exercise tolerance -Status post CABG in 1998 -Myoview stress 03/18/2020 suggested subtle inferior ischemia. -Continue antianginals including carvedilol and Imdur. -Continue Plavix monotherapy and lipid therapy.   PAD -Multiple interventions in 2014; followed by [name] -Per [name] -  Doppler study performed 03/31/2020 revealed a right ABI of 0.39 and a left of 1.12.  She appears to have an occluded right popliteal artery and patent left popliteal stent. -She complains of leg achiness.  No critical limb ischemia.  Symptoms also complicated by diabetic neuropathy.   -Continue walking as able.   -Continue Plavix and statin therapy. -Followed by [name]   Hypertension, well controlled -Switched hydralazine to HCTZ to help with her lower extremity edema om 01/2022.   Hyperlipidemia -LDL 89. -Continue Crestor and Zetia.   Chronic diastolic heart failure, *** -Grade 1 diastolic function on echo April 2021 -Started HCTZ 12.5 mg daily in 01/2022. -Recommend regular compression stocking use and salt restriction.   Disposition - Follow-up ***  with [name] next month as scheduled.  Hopefully we can get the stress test done before then.  Follow-up with [name] in 6 months.  Past Medical History:  Diagnosis Date   Allergy to IVP dye    Angina, class I ([place]) 04/22/2015   Atherosclerotic heart disease native coronary artery w/angina pectoris ([place]) 1992   a. 1992 s/p PTCA of LAD;  b. 1998 CABG x 3; c. 07/2001 Cath: Sev LM/LAD/LCX dzs, 3/3 patent grafts; d. 11/2013 Cath: stable graft anatomy; e. 10/2016 Cath: native 3VD, VG->OM nl, LIMA->LAD nl, VG->Diag 55.   Atherosclerotic heart disease of native coronary artery with angina pectoris ([place]) 01/14/2010   Qualifier: Diagnosis of  By: Deatra Ina MD, Sandy Salaam    Atrial septal aneurysm    Barrett's esophagus 03/04/2010   Qualifier: Diagnosis of  By: Shane Crutch, Amy S    Bilateral leg edema 04/22/2015   Bradycardia, mild briefly to the 40s, asymptomatic 05/16/2013   Carotid arterial disease ([place])    a. 05/2014 Carotid U/S: No signif bilat ICA stenosis, >60% L ECA.   Chronic anemia    Chronic diastolic CHF (congestive heart failure) ([place])    a. 10/2016 Echo: Ef 55-60%, no rwma, Gr1 DD, triv AI, PASP 44mHg, atrial septal aneurysm.   Chronic diastolic CHF (congestive heart failure), NYHA class 2 ([place] 08/11/2017   Claudication ([place] 05/16/2013   Peripheral arterial occlusive disease with lifestyle limiting claudication    Degenerative joint disease 05/30/2018   DIVERTICULOSIS OF COLON 01/14/2010   Qualifier: Diagnosis of  By: KDeatra InaMD, RSandy Salaam   DM (diabetes mellitus), type 2 with peripheral vascular complications ([place]    On Oral medications.   DOE (dyspnea on exertion) 04/22/2015   Essential hypertension    FECAL INCONTINENCE 03/04/2010   Qualifier: Diagnosis of  By: ETrellis PaganiniPA-c, Amy S    GERD (gastroesophageal reflux disease)    Hyperlipidemia with target LDL less than 70    Inflammatory arthritis 10/13/2017   Neuropathy    Non-insulin dependent type 2 diabetes mellitus ([place] 10/24/2015   NSTEMI (non-ST elevated myocardial infarction) ([place] 11/01/2016   PAD (peripheral artery disease) ([place] 05/2013; 09/2013   a. severe calcified POP-1 & TP Trunk Dz bilat;  b . 05/2013 Diamondback Rot Athrectomy (R POP) --> PTA R Pop, DES to R Peroneal;  c. 09/2013 PTA/Stenting of L Pop ([name]- retrograde access);  d. 04/2014 ABI's: R/L: 1.1/1.1.   Preoperative cardiovascular examination 04/15/2020   PUD (peptic ulcer disease)    Rheumatoid arthritis ([place]    S/P CABG x 3 1998   LIMA-LAD, SVG-OM, SVG-D1   Severe claudication ([place] 05/2013   Referred for Peripheral Angio (Dr. BGwenlyn Flores--> Dr. [place]runetta Jeansin [place]   [place]02/01/2010    Qualifier: Diagnosis of  By: NHarlon DittyCMA (AG[place], [name]     Status post total replacement of right hip 10/12/2018   Vertigo 01/02/2014    Past Surgical History:  Procedure Laterality Date   ATHERECTOMY Right 05/15/2013   Procedure: ATHERECTOMY;  Surgeon: JLorretta Harp MD;  [facility];  Service: Cardiovascular;  Laterality: Right;  popliteal   BACK SURGERY     CARDIAC CATHETERIZATION  07/17/01   2 v CAD with LM, circ, LAD, obtuse diag, mod stenosis of diag vein graft , mild stenosis of marg vein graft, nl EF, medical treatment   CARDIAC CATHETERIZATION  11/2013   Widely patent LIMA-LAD & SVG-OM with mild progression of proximal stenosis ~40-50% in SVG-D1; Widely patent native RCA with known severe native LCA disease   CARDIAC CATHETERIZATION N/A 11/01/2016   Procedure: Left Heart Cath and Cors/Grafts  Angiography;  Surgeon: [name], MD;  Location: [facility];  Service: Cardiovascular: Hemodynamics: High LVEDP!.  Cors/Grafts: LM 55%, o-p LAD 100% then after D1 -> LIMA-LAD patent, SVG-Diag ~55%. small RI wiht ~70% ostial. O-p Cx 80% - pCx 70% --> SVG-bifurcating OM patent. -- med Rx   CORONARY ANGIOPLASTY  1992   PCI to LAD   CORONARY ARTERY BYPASS GRAFT  1998   LIMA-LAD, SVG-diagonal (none roughly 50% stenosis), SVG-OM   LEFT HEART CATHETERIZATION WITH CORONARY ANGIOGRAM N/A 11/27/2013   Procedure: LEFT HEART CATHETERIZATION WITH CORONARY ANGIOGRAM;  Surgeon: [name], MD;  Location: [facility] CATH LAB;  Service: Cardiovascular;  Laterality: N/A;   LOW EXTREMITY DOPPLERS/ABI  10/2016   Patent right SFA, popliteal and peroneal tendons. Patent left SFA and popliteal stent. 30-50% stenosis in the right common femoral and popliteal arteries, 50-74% stenosis in left profunda femoris artery. --> 1 year follow-up.  RABI - 1.2, LABI 1.1   LOWER EXTREMITY ANGIOGRAM N/A 02/22/2013   Procedure: LOWER EXTREMITY ANGIOGRAM;  Surgeon: Lorretta Harp, MD;  Location: [facility] CATH  LAB;  Service: Cardiovascular;  Laterality: N/A;   LOWER EXTREMITY ANGIOGRAM N/A 07/20/2013   Procedure: LOWER EXTREMITY ANGIOGRAM;  Surgeon: Lorretta Harp, MD;  Location: [facility] CATH LAB;  Service: Cardiovascular;  Laterality: N/A;   NM MYOVIEW LTD  04/03/2013; 5/26'/2016   Lexiscan: a)  Apical perfusion defect with no ischemia. Consider prior infarct versus breast attenuation.;; b) 5/'16: LOW RISK, Nl EF ~55-65%, No Ischemia /Infarction   NM MYOVIEW LTD  10/'19; 4/'21   a) LOW RISK.  EF 55%.  No ischemia or infarction. b) EF estimated 81%.  Suboptimal images.  May be subtle inferior ischemia-initially read by radiology as intermediate risk, however overread by cardiology to be mild low risk.Marland Kitchen   PERCUTANEOUS STENT INTERVENTION Right 05/15/2013   Procedure: PERCUTANEOUS STENT INTERVENTION;  Surgeon: Lorretta Harp, MD;  [facility] LAB;  Service: Cardiovascular;  Laterality: Right;  tibioperoneal trunk   Peroneal Artery Stent  05/15/13   Diamondback rot. atherectomy of high grade segmental popliteal stenosis then Chocolate balloonPTA; then angiosculpt PTA of the prox peroneal with stent-Xpedition    pv angio  07/20/13   high-grade calcified popliteal disease with one-vessel runoff via a small anterior tibial artery that has proximal disease as well, no intervention   TOTAL HIP ARTHROPLASTY Right 10/12/2018   Procedure: RIGHT TOTAL HIP ARTHROPLASTY ANTERIOR APPROACH;  Surgeon: Mcarthur Rossetti, MD;  Location: [facility] ORS;  Service: Orthopedics;  Laterality: Right;   TOTAL HIP ARTHROPLASTY Left 11/21/2020   Procedure: LEFT TOTAL HIP ARTHROPLASTY ANTERIOR APPROACH;  Surgeon: Mcarthur Rossetti, MD;  Location: [facility] ORS;  Service: Orthopedics;  Laterality: Left;   TRANSTHORACIC ECHOCARDIOGRAM  11/'17; 10/19   a) EF 55-60%. Gr 1 DD. Normal PAP. Aortic Sclerosis.;; b) Normal LV size and function with vigorous EF 65 to 70%.  GR 1 DD.  Severe LA dilation (suggestive of probably worsening: GR 1 DD).    TRANSTHORACIC ECHOCARDIOGRAM  03/18/2020   EF 70-75%.  Hyperdynamic.  GR 1 DD.  No R WMA.  Mild LA dilation.  Mild aortic valve sclerosis but no stenosis.-No change from prior echo   TUBAL LIGATION      Current Medications: No outpatient medications have been marked as taking for the 05/26/22 encounter (Appointment) with Warren Lacy, PA-C.     Allergies:   Fish allergy, Iodinated contrast media, Latex, Shellfish allergy, Amlodipine, Evolocumab, Other, and Metformin  Social History   Socioeconomic History   Marital status: Married    Spouse name: Not on file   Number of children: Not on file   Years of education: Not on file   Highest education level: Not on file  Occupational History   Occupation: retired  Tobacco Use   Smoking status: Never   Smokeless tobacco: Never  Vaping Use   Vaping Use: Never used  Substance and Sexual Activity   Alcohol use: Not Currently    Comment: rare glass of wine    Drug use: No   Sexual activity: Not on file  Other Topics Concern   Not on file  Social History Narrative   Right handed   Lives alone ( husband in rehab facility)   Caffeine- mostly coffee       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   Social Determinants of Health   Financial Resource Strain: Not on file  Food Insecurity: Not on file  Transportation Needs: Not on file  Physical Activity: Not on file  Stress: Not on file  Social Connections: Not on file     Family History: The patient's family history includes Hyperlipidemia in her father and mother; Hypertension in her father and mother. She was adopted.  ROS:   Please see the history of present illness.     EKGs/Labs/Other Studies Reviewed:    EKG:  The ekg ordered today demonstrates ***  Recent Labs: 01/07/2022: ALT 15; B Natriuretic Peptide 68.1; Hemoglobin 11.4; Platelets 265 02/16/2022: BUN 10; Creatinine, Ser 0.90; Potassium 4.2; Sodium 141   Recent Lipid Panel Lab Results  Component Value Date/Time   CHOL 153  02/16/2022 11:39 AM   TRIG 53 02/16/2022 11:39 AM   HDL 53 02/16/2022 11:39 AM   LDLCALC 89 02/16/2022 11:39 AM    Physical Exam:    VS:  There were no vitals taken for this visit.   No data Flores.  Wt Readings from Last 3 Encounters:  03/02/22 159 lb 6.4 oz (72.3 kg)  02/08/22 154 lb (69.9 kg)  02/08/22 154 lb (69.9 kg)     GEN: *** Well nourished, well developed in no acute distress HEENT: Normal NECK: No JVD; No carotid bruits CARDIAC: ***RRR, no murmurs, rubs, gallops RESPIRATORY:  Clear to auscultation without rales, wheezing or rhonchi  ABDOMEN: Soft, non-tender, non-distended MUSCULOSKELETAL: No edema; No deformity  SKIN: Warm and dry NEUROLOGIC:  Alert and oriented PSYCHIATRIC:  Normal affect     ASSESSMENT AND PLAN   ***   {Are you ordering a CV Procedure (e.g. stress test, cath, DCCV, TEE, etc)?   Press F2        :[id number]}    Medication Adjustments/Labs and Tests Ordered: Current medicines are reviewed at length with the patient today.  Concerns regarding medicines are outlined above.  No orders of the defined types were placed in this encounter.  No orders of the defined types were placed in this encounter.   There are no Patient Instructions on file for this visit.   Signed, Warren Lacy, PA-C  05/25/2022 9:57 PM    [facility]

## 2022-05-26 ENCOUNTER — Encounter: Payer: Medicare Other | Admitting: Rehabilitative and Restorative Service Providers"

## 2022-05-26 ENCOUNTER — Ambulatory Visit: Payer: Medicare Other | Admitting: Physician Assistant

## 2022-05-26 DIAGNOSIS — I5032 Chronic diastolic (congestive) heart failure: Secondary | ICD-10-CM

## 2022-05-26 DIAGNOSIS — I10 Essential (primary) hypertension: Secondary | ICD-10-CM

## 2022-05-26 DIAGNOSIS — I25119 Atherosclerotic heart disease of native coronary artery with unspecified angina pectoris: Secondary | ICD-10-CM

## 2022-05-28 ENCOUNTER — Encounter: Payer: Medicare Other | Admitting: Rehabilitative and Restorative Service Providers"

## 2022-06-02 ENCOUNTER — Ambulatory Visit: Payer: Medicare Other | Admitting: Cardiovascular Disease

## 2022-06-02 ENCOUNTER — Encounter: Payer: Self-pay | Admitting: Rehabilitative and Restorative Service Providers"

## 2022-06-02 ENCOUNTER — Ambulatory Visit (INDEPENDENT_AMBULATORY_CARE_PROVIDER_SITE_OTHER): Payer: Medicare Other | Admitting: Rehabilitative and Restorative Service Providers"

## 2022-06-02 DIAGNOSIS — M79605 Pain in left leg: Secondary | ICD-10-CM | POA: Diagnosis not present

## 2022-06-02 DIAGNOSIS — M5459 Other low back pain: Secondary | ICD-10-CM

## 2022-06-02 DIAGNOSIS — M25552 Pain in left hip: Secondary | ICD-10-CM | POA: Diagnosis not present

## 2022-06-02 DIAGNOSIS — M6281 Muscle weakness (generalized): Secondary | ICD-10-CM

## 2022-06-02 NOTE — Therapy (Addendum)
OUTPATIENT PHYSICAL THERAPY TREATMENT NOTE /PROGRESS NOTE  /DISCHARGE   Patient Name: [name] MRN: [id number] DOB:1933/03/15, 88 y.o., female Today's Date: 06/02/2022  Progress Note Reporting Period 04/06/2022 to 06/02/2022  See note below for Objective Data and Assessment of Progress/Goals.      END OF SESSION:   PT End of Session - 06/02/22 1359     Visit Number 7    Number of Visits 20    Date for PT Re-Evaluation 06/15/22    Authorization Type U[employer] Medicare 20% coinsurance    PT Start Time 1350    PT Stop Time 1429    PT Time Calculation (min) 39 min    Activity Tolerance Patient tolerated treatment well    Behavior During Therapy WFL for tasks assessed/performed                 Past Medical History:  Diagnosis Date   Allergy to IVP dye    Angina, class I ([place]) 04/22/2015   Atherosclerotic heart disease native coronary artery w/angina pectoris ([place]) 1992   a. 1992 s/p PTCA of LAD;  b. 1998 CABG x 3; c. 07/2001 Cath: Sev LM/LAD/LCX dzs, 3/3 patent grafts; d. 11/2013 Cath: stable graft anatomy; e. 10/2016 Cath: native 3VD, VG->OM nl, LIMA->LAD nl, VG->Diag 55.   Atherosclerotic heart disease of native coronary artery with angina pectoris ([place]) 01/14/2010   Qualifier: Diagnosis of  By: Deatra Ina MD, Sandy Salaam    Atrial septal aneurysm    Barrett's esophagus 03/04/2010   Qualifier: Diagnosis of  By: Shane Crutch, Amy S    Bilateral leg edema 04/22/2015   Bradycardia, mild briefly to the 40s, asymptomatic 05/16/2013   Carotid arterial disease ([place])    a. 05/2014 Carotid U/S: No signif bilat ICA stenosis, >60% L ECA.   Chronic anemia    Chronic diastolic CHF (congestive heart failure) ([place])    a. 10/2016 Echo: Ef 55-60%, no rwma, Gr1 DD, triv AI, PASP 75mHg, atrial septal aneurysm.   Chronic diastolic CHF (congestive heart failure), NYHA class 2 ([place] 08/11/2017   Claudication ([place] 05/16/2013   Peripheral arterial occlusive disease with lifestyle limiting  claudication    Degenerative joint disease 05/30/2018   DIVERTICULOSIS OF COLON 01/14/2010   Qualifier: Diagnosis of  By: KDeatra InaMD, RSandy Salaam   DM (diabetes mellitus), type 2 with peripheral vascular complications ([place]    On Oral medications.   DOE (dyspnea on exertion) 04/22/2015   Essential hypertension    FECAL INCONTINENCE 03/04/2010   Qualifier: Diagnosis of  By: ETrellis PaganiniPA-c, Amy S    GERD (gastroesophageal reflux disease)    Hyperlipidemia with target LDL less than 70    Inflammatory arthritis 10/13/2017   Neuropathy    Non-insulin dependent type 2 diabetes mellitus ([place] 10/24/2015   NSTEMI (non-ST elevated myocardial infarction) ([place] 11/01/2016   PAD (peripheral artery disease) ([place] 05/2013; 09/2013   a. severe calcified POP-1 & TP Trunk Dz bilat;  b . 05/2013 Diamondback Rot Athrectomy (R POP) --> PTA R Pop, DES to R Peroneal;  c. 09/2013 PTA/Stenting of L Pop ([name]- retrograde access);  d. 04/2014 ABI's: R/L: 1.1/1.1.   Preoperative cardiovascular examination 04/15/2020   PUD (peptic ulcer disease)    Rheumatoid arthritis ([place]    S/P CABG x 3 1998   LIMA-LAD, SVG-OM, SVG-D1   Severe claudication ([place] 05/2013   Referred for Peripheral Angio (Dr. BGwenlyn Found--> Dr. [place]runetta [place]ansin [place]   [place]/01/2010  Qualifier: Diagnosis of  By: Nelson-Smith CMA (AAMA), [name]     Status post total replacement of right hip 10/12/2018   Vertigo 01/02/2014   Past Surgical History:  Procedure Laterality Date   ATHERECTOMY Right 05/15/2013   Procedure: ATHERECTOMY;  Surgeon: Lorretta Harp, MD;  Location: [facility] CATH LAB;  Service: Cardiovascular;  Laterality: Right;  popliteal   BACK SURGERY     CARDIAC CATHETERIZATION  07/17/01   2 v CAD with LM, circ, LAD, obtuse diag, mod stenosis of diag vein graft , mild stenosis of marg vein graft, nl EF, medical treatment   CARDIAC CATHETERIZATION  11/2013   Widely patent LIMA-LAD & SVG-OM with mild progression of proximal stenosis  ~40-50% in SVG-D1; Widely patent native RCA with known severe native LCA disease   CARDIAC CATHETERIZATION N/A 11/01/2016   Procedure: Left Heart Cath and Cors/Grafts Angiography;  Surgeon: [name], MD;  Location: [place] CV LAB;  Service: Cardiovascular: Hemodynamics: High LVEDP!.  Cors/Grafts: LM 55%, o-p LAD 100% then after D1 -> LIMA-LAD patent, SVG-Diag ~55%. small RI wiht ~70% ostial. O-p Cx 80% - pCx 70% --> SVG-bifurcating OM patent. -- med Rx   CORONARY ANGIOPLASTY  1992   PCI to LAD   CORONARY ARTERY BYPASS GRAFT  1998   LIMA-LAD, SVG-diagonal (none roughly 50% stenosis), SVG-OM   LEFT HEART CATHETERIZATION WITH CORONARY ANGIOGRAM N/A 11/27/2013   Procedure: LEFT HEART CATHETERIZATION WITH CORONARY ANGIOGRAM;  Surgeon: [name], MD;  Location[facility] CATH LAB;  Service: Cardiovascular;  Laterality: N/A;   LOW EXTREMITY DOPPLERS/ABI  10/2016   Patent right SFA, popliteal and peroneal tendons. Patent left SFA and popliteal stent. 30-50% stenosis in the right common femoral and popliteal arteries, 50-74% stenosis in left profunda femoris artery. --> 1 year follow-up.  RABI - 1.2, LABI 1.1   LOWER EXTREMITY ANGIOGRAM N/A 02/22/2013   Procedure: LOWER EXTREMITY ANGIOGRAM;  Surgeon: Lorretta Harp, MD;  Location: [facility] CATH LAB;  Service: Cardiovascular;  Laterality: N/A;   LOWER EXTREMITY ANGIOGRAM N/A 07/20/2013   Procedure: LOWER EXTREMITY ANGIOGRAM;  Surgeon: Lorretta Harp, MD;  Location: [facility] CATH LAB;  Service: Cardiovascular;  Laterality: N/A;   NM MYOVIEW LTD  04/03/2013; 5/26'/2016   Lexiscan: a)  Apical perfusion defect with no ischemia. Consider prior infarct versus breast attenuation.;; b) 5/'16: LOW RISK, Nl EF ~55-65%, No Ischemia /Infarction   NM MYOVIEW LTD  10/'19; 4/'21   a) LOW RISK.  EF 55%.  No ischemia or infarction. b) EF estimated 81%.  Suboptimal images.  May be subtle inferior ischemia-initially read by radiology as intermediate risk, however overread by  cardiology to be mild low risk.Marland Kitchen   PERCUTANEOUS STENT INTERVENTION Right 05/15/2013   Procedure: PERCUTANEOUS STENT INTERVENTION;  Surgeon: Lorretta Harp, MD;  Location: [facility] CATH LAB;  Service: Cardiovascular;  Laterality: Right;  tibioperoneal trunk   Peroneal Artery Stent  05/15/13   Diamondback rot. atherectomy of high grade segmental popliteal stenosis then Chocolate balloonPTA; then angiosculpt PTA of the prox peroneal with stent-Xpedition    pv angio  07/20/13   high-grade calcified popliteal disease with one-vessel runoff via a small anterior tibial artery that has proximal disease as well, no intervention   TOTAL HIP ARTHROPLASTY Right 10/12/2018   Procedure: RIGHT TOTAL HIP ARTHROPLASTY ANTERIOR APPROACH;  Surgeon: Mcarthur Rossetti, MD;  Location: [facility] ORS;  Service: Orthopedics;  Laterality: Right;   TOTAL HIP ARTHROPLASTY Left 11/21/2020   Procedure: LEFT TOTAL HIP ARTHROPLASTY ANTERIOR APPROACH;  Surgeon: Mcarthur Rossetti, MD;  Location: [facility] ORS;  Service: Orthopedics;  Laterality: Left;   TRANSTHORACIC ECHOCARDIOGRAM  11/'17; 10/19   a) EF 55-60%. Gr 1 DD. Normal PAP. Aortic Sclerosis.;; b) Normal LV size and function with vigorous EF 65 to 70%.  GR 1 DD.  Severe LA dilation (suggestive of probably worsening: GR 1 DD).   TRANSTHORACIC ECHOCARDIOGRAM  03/18/2020   EF 70-75%.  Hyperdynamic.  GR 1 DD.  No R WMA.  Mild LA dilation.  Mild aortic valve sclerosis but no stenosis.-No change from prior echo   TUBAL LIGATION     Patient Active Problem List   Diagnosis Date Noted   Anxiety 11/04/2021   Avascular necrosis ([place]) 11/04/2021   Chronic interstitial cystitis 11/04/2021   Diabetic peripheral neuropathy associated with type 2 diabetes mellitus ([place]) 11/04/2021   Polyneuropathy due to type 2 diabetes mellitus ([place]) 11/04/2021   Eczema 11/04/2021   History of urinary tract infection 11/04/2021   Neuropathy 11/04/2021   Other long term (current) drug therapy 11/04/2021    Peptic ulcer disease 11/04/2021   Presence of aortocoronary bypass graft 11/04/2021   Skin sensation disturbance 11/04/2021   Vitamin D deficiency 11/04/2021   Gastroesophageal reflux disease 11/04/2021   Rheumatoid arthritis ([place]) 11/04/2021   Peripheral vascular disease ([place]) 11/04/2021   Type 2 diabetes mellitus with other specified complication ([place]) 17/40/8144   Chronic diastolic heart failure ([place]) 01/07/2021   Status post total hip replacement, right 12/04/2020   Status post total replacement of left hip 11/21/2020   Near syncope 07/10/2020   Hx of CABG 07/10/2020   Preoperative cardiovascular examination 04/15/2020   Chest pain 03/18/2020   Unilateral primary osteoarthritis, left hip 04/03/2019   History of right hip replacement 04/03/2019   Status post total replacement of right hip 10/12/2018   Unilateral primary osteoarthritis, right hip 09/12/2018   Degenerative joint disease 05/30/2018   Anal fissure 11/10/2017   Rectal bleeding 11/10/2017   Rectal pain 11/10/2017   Inflammatory arthritis 10/13/2017   Latent tuberculosis by blood test 10/13/2017   Chronic diastolic CHF (congestive heart failure), NYHA class 2 ([place]) 08/11/2017   Fatigue due to treatment 01/09/2017   NSTEMI (non-ST elevated myocardial infarction) ([place]) 11/01/2016   Diabetes mellitus ([place]) 10/24/2015   Non-insulin dependent type 2 diabetes mellitus ([place]) 10/24/2015   Angina, class I ([place]) 04/22/2015   DOE (dyspnea on exertion) 04/22/2015   Bilateral leg edema 04/22/2015   Accumulation of fluid in tissues 03/21/2014   Vertigo, central 01/02/2014   Vertigo 01/02/2014   Limb pain- echymosis, pain Lt radial artery after cath 12/11/2013   Dyslipidemia, goal LDL below 70 05/17/2013   Essential hypertension 05/17/2013   Claudication ([place]) 05/16/2013   PTA/ Stent Rt popliteal and Rt peroneal artery 05/16/13 05/16/2013   PAD,severe calcified pop and tibial peroneal trunk disease bilateraly    ABDOMINAL PAIN  RIGHT LOWER QUADRANT 10/07/2010   CVA-STROKE 03/04/2010   BARRETT'S ESOPHAGUS 03/04/2010   FECAL INCONTINENCE 03/04/2010   DM 01/14/2010   Anemia of chronic disease 01/14/2010   Atherosclerotic heart disease of native coronary artery with angina pectoris ([place]) 01/14/2010   DIVERTICULOSIS OF COLON 01/14/2010   PERSONAL HISTORY OF COLONIC POLYPS 01/14/2010   SHELLFISH ALLERGY 01/14/2010    PCP: Janie Morning, DO   REFERRING PROVIDER: Mcarthur Rossetti*   REFERRING DIAG: Y18.56 (ICD-10-CM) - It band syndrome, left  ONSET DATE: Dec 2021  THERAPY DIAG:  Pain in left leg  Pain in left hip  Other low back pain  Muscle weakness (generalized)  PERTINENT HISTORY: Left hip pain with  IT band syndrome; troch bursitis and previous THA 2021 PMH: CAD s/p CABG, CHF, PAD with claudication, DM with neuropathy, HTN, HLD, NSTEMI, RA, bil THA, hx back surgery  PRECAUTIONS: None  SUBJECTIVE:  Pt indicated the spot on the Rt side of back/posterior hip still noted up to 4/10, "like a sore spot."  Pt indicated doing some exercises since last visit.    Rated Quite a Bit Better +5 on GROC.   PAIN:  NPRS scale: at worst 4/10 Pain location: Back, Rt hip mainly Pain description: constant pain Aggravating factors: increased activity  Relieving factors: heat ice, rest, medicine (not much)   OBJECTIVE:    DIAGNOSTIC FINDINGS: 04/06/2022: Previous xray: An AP pelvis and lateral left hip shows well-seated bilateral total hip  arthroplasties with no complicating features.   PATIENT SURVEYS:  06/02/2022:  FOTO update:  55 %  04/06/2022 FOTO intake:  53 predicted:  59   COGNITION: 04/06/2022 Overall cognitive status: Within functional limits for tasks assessed                               POSTURE:   04/06/2022 : Reduced lumbar lordosis c mild forward trunk lean in standing positioning c mild decrease of stance on Lt leg   PALPATION:  04/06/2022: tenderness noted throughout Lt lateral thigh  and posterior/lateral hip c Trigger points noted in glute med, max, lateral vastus lateralis on Lt.  Mild tenderness to touch in Rt lumbar paraspinals, QL   LUMBAR AROM: 06/02/2022: Lumbar extension 75% WFL c no symptoms.   04/27/2022:  Lumbar extension 50% WFL today c ERP lumbar noted.    04/06/2022:   Extension: 25 % WFL c end range pain on Rt lumbar/hip.  Repeated x 5 in standing: improved to 75% WFL c continued ERP in Rt lumbar/hip (no centralization/perhiperialization)   LE ROM:    ROM Right 04/06/2022 Left 04/06/2022 Right 05/04/2022 Left 05/04/2022  Hip flexion   100 PROM in supine  101 PROM in supine  Hip extension        Hip abduction        Hip adduction        Hip internal rotation 30 PROM in supine 90 deg flexion 25 PROM in supine 90 deg flexion 30 PROM in supine 90 deg flexion 23 PROM in supine 90 deg flexion  Hip external rotation 42 PROM in supine 90 deg flexion 35 PROM in supine 90 deg flexion 45 PROM in supine 90 deg flexion 33 PROM in supine 90 deg flexion  Knee flexion        Knee extension        Ankle dorsiflexion        Ankle plantarflexion        Ankle inversion        Ankle eversion         (Blank rows = not tested) 04/06/2022: pain limited end range in all hip movements.    LE MMT:   MMT Right 04/06/2022 Left 04/06/2022  Hip flexion 4/5 4/5  Hip extension      Hip abduction   3/5 c Lt leg pain  Hip adduction      Hip internal rotation      Hip external rotation      Knee flexion 5/5 5/5  Knee extension 4/5 c back pain 5/5  Ankle dorsiflexion      Ankle plantarflexion      Ankle inversion      Ankle eversion       (Blank rows = not tested)   LOWER EXTREMITY SPECIAL TESTS:  04/06/2022 : (+) FABER bilateral (Lt for hip pain, Rt for back pain)   FUNCTIONAL TESTS:  06/02/2022:  TUG c SPC: 13 seconds    05/04/2022: TUG: c SPC: 14.88 seconds  04/06/2022        18 inch chair sit to stand: no UE on 2nd attempt c pain noted                         TUG :  c  SPC 14.92 seconds                                                 Lt SLS: 6 seconds, Rt SLS 7 seconds GAIT: 04/06/2022 SPC in Rt UE household distances within clinic c wider base of support noted and mild decrease of stance on Lt leg and limited hip extension bilateral     TODAY'S TREATMENT: 06/02/2022:             Therex: Nustep lvl 6 10 mins UE/LE                     Standing lumbar extension x 5 AROM    Sit to stand to sit 18 inch chair s UE assist 2 x 5    Seated clam shell green band x 20 bilateral   Modified tandem stance 30 sec x 1 bilateral c hand use as need for correction   Church pew anterior/posterior ankle strategy 2 mins     TUG c SPC    Review of existing HEP for home use( verbal and visual cues).  Additional time spent in review.      05/04/2022:             Therex:                        Standing lumbar extension x 5 AROM    Supine bridge 2 x 10   Supine lumbar trunk rotation 15 sec x 3 bilateral   Seated on foam marching x 10 bilateral, marching c ipsilateral arm lift x 10 bilateral   Seated on foam red pball press down hold UE 5 sec x 10   Seated on foam red pball hip adductor squeeze c UE in 90 deg flexion hold 5 sec x 10   Double leg press 50 lbs 2 x 10, single leg 25 lbs x 10, performed bilateral  04/29/2022:             Therex:                         Nustep lvl 5 8 mins UE/LE   Standing lumbar extension 2 x 5 AROM    Supine hooklying hip add ball squeeze 5 sec hold x 10   Supine green band clam shell x20 bilateral   Supine bridge 2 x 10   Supine figure 4 push away 15 sec x 3 bilateral    PATIENT EDUCATION:  06/02/2022 Education details: HEP update Person educated: Patient Education method: Consulting civil engineer, Demonstration,  Verbal cues, and Handouts Education comprehension: verbalized understanding, verbal cues required, and needs further education     HOME EXERCISE PROGRAM: 04/27/2022 Access Code: [email or web address] URL: [email or web address] Date:  04/27/2022 Prepared by: Scot Jun  Exercises - Supine Bridge  - 2 x daily - 7 x weekly - 3 sets - 10 reps - 2 hold - Supine Piriformis Stretch with Foot on Ground  - 2 x daily - 7 x weekly - 1 sets - 5 reps - 15-20 hold - Sit to Stand  - 1 x daily - 7 x weekly - 3 sets - 10 reps - Supine Lower Trunk Rotation  - 2-3 x daily - 7 x weekly - 1 sets - 3-5 reps - 15 hold - Hooklying Clamshell with Resistance  - 2 x daily - 7 x weekly - 1-2 sets - 10 reps - Standing Lumbar Extension with Counter  - 3-5 x daily - 7 x weekly - 1 sets - 5-10 reps   ASSESSMENT:   CLINICAL IMPRESSION: Pt had attended 7 visits overall during course of treatment, with periods of inactivity for in clinic activity.  See objective data for updated information.  Gains noted in overall mobility and ambulation to this point compared to evaluation. At this time, Pt may be best suiting from transitioning period to HEP at this time due to infrequency of visits in clinic and overall knowledge of HEP to this point.      OBJECTIVE IMPAIRMENTS Abnormal gait, decreased activity tolerance, decreased balance, decreased coordination, decreased endurance, decreased mobility, difficulty walking, decreased ROM, decreased strength, hypomobility, increased fascial restrictions, impaired perceived functional ability, increased muscle spasms, impaired flexibility, improper body mechanics, postural dysfunction, and pain.    ACTIVITY LIMITATIONS cleaning, community activity, meal prep, and shopping.    PERSONAL FACTORS Time since onset of injury/illness/exacerbation, PMH: CAD s/p CABG, CHF, PAD with claudication, DM with neuropathy, HTN, HLD, NSTEMI, RA, bil THA, hx back surgery are also affecting patient's functional outcome.      REHAB POTENTIAL: Fair     CLINICAL DECISION MAKING: Evolving/moderate complexity   EVALUATION COMPLEXITY: Medium     GOALS: Goals reviewed with patient? Yes   Short term PT Goals (target date for Short  term goals are 3 weeks 04/27/2022) Patient will demonstrate independent use of home exercise program to maintain progress from in clinic treatments. Goal status: not met - assessed 04/27/2022 (cues required)    Long term PT goals (target dates for all long term goals are 10 weeks  06/15/2022 )   1. Patient will demonstrate/report pain at worst less than or equal to 2/10 to facilitate minimal limitation in daily activity secondary to pain symptoms. Goal status: partially met - assessed 06/02/2022   2. Patient will demonstrate independent use of home exercise program to facilitate ability to maintain/progress functional gains from skilled physical therapy services. Goal status: MET - assessed 06/02/2022   3. Patient will demonstrate FOTO outcome > or = 59 % to indicate reduced disability due to condition. Goal status: partially met- assessed 04/27/2022   4.  Patient will demonstrate lumbar extension 100 % WFL s symptoms to facilitate upright standing, walking posture at PLOF s limitation. Goal status: partially met - assessed 06/02/2022   5.  Patient will demonstrate bilateral hip flexion MMT 5/5, hip abduction > or = 4/5, knee MMT 5/5 throughout to facilitate ability to perform usual standing, walking, stairs at PLOF s limitation due to symptoms.   Goal status: on going - assessed 06/02/2022  6.  Patient will demonstrate bilateral SLS > or = 10 seconds to facilitate stability in ambulation.   Goal status: on going - assessed 06/02/2022   7.  Patient will demonstrate TUG < 14 seconds independently to indicate reduce fall risk.  a.  Goal Status: MET - assessed 06/02/2022     PLAN: PT FREQUENCY: 1-2x/week   PT DURATION: 10 weeks   PLANNED INTERVENTIONS: Therapeutic exercises, Therapeutic activity, Neuro Muscular re-education, Balance training, Gait training, Patient/Family education, Joint mobilization, Stair training, DME instructions, Dry Needling, Electrical stimulation, Cryotherapy, Moist  heat, Taping, Ultrasound, Ionotophoresis 70m/ml Dexamethasone, and Manual therapy.  All included unless contraindicated   PLAN FOR NEXT SESSION:  Trial HEP at this time, Discharge after 30 days inactivity.    MScot Jun PT, DPT, OCS, ATC 06/02/22  2:27 PM  PHYSICAL THERAPY DISCHARGE SUMMARY  Visits from Start of Care: 7  Current functional level related to goals / functional outcomes: See note   Remaining deficits: See note   Education / Equipment: HEP   Patient goals were partially met. Patient is being discharged due to not returning since the last visit. MScot Jun PT, DPT, OCS, ATC 07/07/22  3:27 PM

## 2022-06-16 NOTE — Progress Notes (Signed)
Cardiology Clinic Note   Patient Name: [name] Date of Encounter: 06/18/2022  Primary Care Provider:  Janie Morning, DO Primary Cardiologist:  [name], MD  Patient Profile    [name] 88 year old female presents to clinic today for follow-up evaluation of her essential hypertension, coronary artery disease and peripheral arterial disease.  Past Medical History    Past Medical History:  Diagnosis Date   Allergy to IVP dye    Angina, class I ([place]) 04/22/2015   Atherosclerotic heart disease native coronary artery w/angina pectoris ([place]) 1992   a. 1992 s/p PTCA of LAD;  b. 1998 CABG x 3; c. 07/2001 Cath: Sev LM/LAD/LCX dzs, 3/3 patent grafts; d. 11/2013 Cath: stable graft anatomy; e. 10/2016 Cath: native 3VD, VG->OM nl, LIMA->LAD nl, VG->Diag 55.   Atherosclerotic heart disease of native coronary artery with angina pectoris ([place]) 01/14/2010   Qualifier: Diagnosis of  By: Deatra Ina MD, Sandy Salaam    Atrial septal aneurysm    Barrett's esophagus 03/04/2010   Qualifier: Diagnosis of  By: Shane Crutch, Amy S    Bilateral leg edema 04/22/2015   Bradycardia, mild briefly to the 40s, asymptomatic 05/16/2013   Carotid arterial disease ([place])    a. 05/2014 Carotid U/S: No signif bilat ICA stenosis, >60% L ECA.   Chronic anemia    Chronic diastolic CHF (congestive heart failure) ([place])    a. 10/2016 Echo: Ef 55-60%, no rwma, Gr1 DD, triv AI, PASP 61mHg, atrial septal aneurysm.   Chronic diastolic CHF (congestive heart failure), NYHA class 2 ([place] 08/11/2017   Claudication ([place] 05/16/2013   Peripheral arterial occlusive disease with lifestyle limiting claudication    Degenerative joint disease 05/30/2018   DIVERTICULOSIS OF COLON 01/14/2010   Qualifier: Diagnosis of  By: KDeatra InaMD, RSandy Salaam   DM (diabetes mellitus), type 2 with peripheral vascular complications ([place]    On Oral medications.   DOE (dyspnea on exertion) 04/22/2015   Essential hypertension    FECAL INCONTINENCE  03/04/2010   Qualifier: Diagnosis of  By: ETrellis PaganiniPA-c, Amy S    GERD (gastroesophageal reflux disease)    Hyperlipidemia with target LDL less than 70    Inflammatory arthritis 10/13/2017   Neuropathy    Non-insulin dependent type 2 diabetes mellitus ([place] 10/24/2015   NSTEMI (non-ST elevated myocardial infarction) ([place] 11/01/2016   PAD (peripheral artery disease) ([place] 05/2013; 09/2013   a. severe calcified POP-1 & TP Trunk Dz bilat;  b . 05/2013 Diamondback Rot Athrectomy (R POP) --> PTA R Pop, DES to R Peroneal;  c. 09/2013 PTA/Stenting of L Pop ([name]- retrograde access);  d. 04/2014 ABI's: R/L: 1.1/1.1.   Preoperative cardiovascular examination 04/15/2020   PUD (peptic ulcer disease)    Rheumatoid arthritis ([place]    S/P CABG x 3 1998   LIMA-LAD, SVG-OM, SVG-D1   Severe claudication ([place] 05/2013   Referred for Peripheral Angio (Dr. BGwenlyn Found--> Dr. GBrunetta Jeansin [place]   [place]02/01/2010   Qualifier: Diagnosis of  By: NHarlon DittyCMA ([place], [name]     Status post total replacement of right hip 10/12/2018   Vertigo 01/02/2014   Past Surgical History:  Procedure Laterality Date   ATHERECTOMY Right 05/15/2013   Procedure: ATHERECTOMY;  Surgeon: JLorretta Harp MD;  [facility];  Service: Cardiovascular;  Laterality: Right;  popliteal   BACK SURGERY     CARDIAC CATHETERIZATION  07/17/01   2 v CAD with LM, circ, LAD, obtuse  diag, mod stenosis of diag vein graft , mild stenosis of marg vein graft, nl EF, medical treatment   CARDIAC CATHETERIZATION  11/2013   Widely patent LIMA-LAD & SVG-OM with mild progression of proximal stenosis ~40-50% in SVG-D1; Widely patent native RCA with known severe native LCA disease   CARDIAC CATHETERIZATION N/A 11/01/2016   Procedure: Left Heart Cath and Cors/Grafts Angiography;  Surgeon: [name], MD;  Location: [place] CV LAB;  Service: Cardiovascular: Hemodynamics: High LVEDP!.  Cors/Grafts: LM 55%, o-p LAD 100% then  after D1 -> LIMA-LAD patent, SVG-Diag ~55%. small RI wiht ~70% ostial. O-p Cx 80% - pCx 70% --> SVG-bifurcating OM patent. -- med Rx   CORONARY ANGIOPLASTY  1992   PCI to LAD   CORONARY ARTERY BYPASS GRAFT  1998   LIMA-LAD, SVG-diagonal (none roughly 50% stenosis), SVG-OM   LEFT HEART CATHETERIZATION WITH CORONARY ANGIOGRAM N/A 11/27/2013   Procedure: LEFT HEART CATHETERIZATION WITH CORONARY ANGIOGRAM;  Surgeon: [name], MD;  Location[facility] CATH LAB;  Service: Cardiovascular;  Laterality: N/A;   LOW EXTREMITY DOPPLERS/ABI  10/2016   Patent right SFA, popliteal and peroneal tendons. Patent left SFA and popliteal stent. 30-50% stenosis in the right common femoral and popliteal arteries, 50-74% stenosis in left profunda femoris artery. --> 1 year follow-up.  RABI - 1.2, LABI 1.1   LOWER EXTREMITY ANGIOGRAM N/A 02/22/2013   Procedure: LOWER EXTREMITY ANGIOGRAM;  Surgeon: Lorretta Harp, MD;  [facility] C[facility] LAB;  Service: Cardiovascular;  Laterality: N/A;   LOWER EXTREMITY ANGIOGRAM N/A 07/20/2013   Procedure: LOWER EXTREMITY ANGIOGRAM;  Surgeon: Lorretta Harp, MD;  Location[facility] C[facility] LAB;  Service: Cardiovascular;  Laterality: N/A;   NM MYOVIEW LTD  04/03/2013; 5/26'/2016   Lexiscan: a)  Apical perfusion defect with no ischemia. Consider prior infarct versus breast attenuation.;; b) 5/'16: LOW RISK, Nl EF ~55-65%, No Ischemia /Infarction   NM MYOVIEW LTD  10/'19; 4/'21   a) LOW RISK.  EF 55%.  No ischemia or infarction. b) EF estimated 81%.  Suboptimal images.  May be subtle inferior ischemia-initially read by radiology as intermediate risk, however overread by cardiology to be mild low risk.Marland Kitchen   PERCUTANEOUS STENT INTERVENTION Right 05/15/2013   Procedure: PERCUTANEOUS STENT INTERVENTION;  Surgeon: Lorretta Harp, MD;  Location[facility] C[facility] LAB;  Service: Cardiovascular;  Laterality: Right;  tibioperoneal trunk   Peroneal Artery Stent  05/15/13   Diamondback rot. atherectomy of high grade  segmental popliteal stenosis then Chocolate balloonPTA; then angiosculpt PTA of the prox peroneal with stent-Xpedition    pv angio  07/20/13   high-grade calcified popliteal disease with one-vessel runoff via a small anterior tibial artery that has proximal disease as well, no intervention   TOTAL HIP ARTHROPLASTY Right 10/12/2018   Procedure: RIGHT TOTAL HIP ARTHROPLASTY ANTERIOR APPROACH;  Surgeon: Mcarthur Rossetti, MD;  Location: [facility] ORS;  Service: Orthopedics;  Laterality: Right;   TOTAL HIP ARTHROPLASTY Left 11/21/2020   Procedure: LEFT TOTAL HIP ARTHROPLASTY ANTERIOR APPROACH;  Surgeon: Mcarthur Rossetti, MD;  Location: [facility] ORS;  Service: Orthopedics;  Laterality: Left;   TRANSTHORACIC ECHOCARDIOGRAM  11/'17; 10/19   a) EF 55-60%. Gr 1 DD. Normal PAP. Aortic Sclerosis.;; b) Normal LV size and function with vigorous EF 65 to 70%.  GR 1 DD.  Severe LA dilation (suggestive of probably worsening: GR 1 DD).   TRANSTHORACIC ECHOCARDIOGRAM  03/18/2020   EF 70-75%.  Hyperdynamic.  GR 1 DD.  No R WMA.  Mild  LA dilation.  Mild aortic valve sclerosis but no stenosis.-No change from prior echo   TUBAL LIGATION      Allergies  Allergies  Allergen Reactions   Fish Allergy Hives and Shortness Of Breath   Iodinated Contrast Media Shortness Of Breath and Anaphylaxis    Other reaction(s): Unknown   Latex Anaphylaxis, Hives, Shortness Of Breath, Itching and Other (See Comments)    REACTION: wheezing Other reaction(s): Unknown   Shellfish Allergy Anaphylaxis, Hives, Shortness Of Breath and Other (See Comments)    All seafood   Amlodipine Swelling   Evolocumab     Other reaction(s): latex on syringe   Other Itching and Other (See Comments)    Patient reports allergy to perfumed detergents. Causes itching.   Metformin Nausea Only and Nausea And Vomiting    Other reaction(s): Unknown    History of Present Illness    [name] has a PMH of coronary artery disease, peripheral arterial  disease, dyslipidemia, and essential hypertension.  She is post CABG in 1998.  She was noted to have preserved LV function and a nonischemic nuclear stress test.  Due to claudication she underwent peripheral angiography.  She was noted to have significant popliteal and infrapopliteal disease.  She was noted to have lifestyle limiting claudication.  She underwent diamondback orbital rotational arthrectomy, PTA for her distal right SFA and stenting of her coronary with DES 05/16/2013.  She did not notice any improvement with her claudication post procedure.  She did note that her right leg was less heavy and that her left leg was more symptomatic.  Her study was repeated 07/20/2013 with intention of fixing her left leg however due to her anatomic complexity intervention was deferred.  She was seen by Dr. Andree Elk in [place] who performed interventional left popliteal and tibioperoneal trunk intervention in 2014.  Her follow-up lower extremity Dopplers 04/29/2014 showed excellent ABIs greater than 1 bilaterally.  Her subsequent Dopplers in December showed right ABI 0.81 left ABI of 0.91 and patent stents.  She was admitted to the hospital 03/18/2020 with chest discomfort.  She was ruled out for MI.  Nuclear stress test showed low risk and mild inferior ischemia.  Her left total hip replacement was delayed due to COVID-19.  However, she did have a successful hip surgery in December.  She underwent right total knee replacement in 2019.  She followed up with Dr. Gwenlyn Found 03/02/2022.  During that time she did note some occasional episodes of chest discomfort.  Her stress testing from 02/08/2022 showed low risk and no ischemia.  She noted some difficulty with ambulation.  She was noted to have lower extremity edema and followed up with Caron Presume, PA-C.  During that time her hydralazine was changed to HCTZ.  On exam she was noted to have +1 lower extremity edema.  She presents to the clinic today for follow-up evaluation  states she just completed a course of physical therapy for her hip surgery.  She continues to do the exercises.  She also does extra exercises with a TV program and is planning to go to the senior center.  She has been monitoring her diet and does not add extra salt.  She does note some bilateral arm discomfort at night.  This appears to be related to nerve, MSK type discomfort.  I will continue her current medication regimen, have her maintain her physical activity and diet and plan follow-up in 6 to 9 months.  Today she denies chest pain, shortness of  breath, lower extremity edema, fatigue, palpitations, melena, hematuria, hemoptysis, diaphoresis, weakness, presyncope, syncope, orthopnea, and PND.    Home Medications    Prior to Admission medications   Medication Sig Start Date End Date Taking? Authorizing Provider  acetaminophen (TYLENOL) 650 MG CR tablet Take 650 mg by mouth every 8 (eight) hours as needed for pain.    [provider]  acetaminophen-codeine (TYLENOL #3) 300-30 MG tablet Take 1-2 tablets by mouth every 8 (eight) hours as needed. 03/20/21   Mcarthur Rossetti, MD  baclofen (LIORESAL) 10 MG tablet Take 1 tablet (10 mg total) by mouth 3 (three) times daily. 04/21/22   Mcarthur Rossetti, MD  carvedilol (COREG) 6.25 MG tablet TAKE 1 TABLET BY MOUTH TWICE A DAY 04/13/21   Lorretta Harp, MD  Cholecalciferol (VITAMIN D3) 10 MCG (400 UNIT) tablet Take 400 Units by mouth daily.    [provider]  clopidogrel (PLAVIX) 75 MG tablet Take 75 mg by mouth daily.    [provider]  Coenzyme Q10 (CO Q 10 PO) Take 1 tablet by mouth every other day.     [provider]  ezetimibe (ZETIA) 10 MG tablet Take 10 mg by mouth at bedtime.    [provider]  folic acid (FOLVITE) 1 MG tablet Take 1 mg by mouth daily. 08/21/18   [provider]  gabapentin (NEURONTIN) 400 MG capsule Take 400 mg by mouth 3 (three) times daily. 06/09/20    [provider]  glimepiride (AMARYL) 2 MG tablet Take 2 mg by mouth daily as needed. 01/07/22   [provider]  glimepiride (AMARYL) 4 MG tablet Take 4 mg by mouth daily with breakfast.    [provider]  hydrochlorothiazide (MICROZIDE) 12.5 MG capsule Take 1 capsule (12.5 mg total) by mouth daily. 02/04/22 05/05/22  Warren Lacy, PA-C  isosorbide mononitrate (IMDUR) 60 MG 24 hr tablet TAKE 1.5 TABLETS (90 MG TOTAL) BY MOUTH DAILY. Marland Kitchen 04/12/22   Lorretta Harp, MD  LORazepam (ATIVAN) 0.5 MG tablet Take 0.5 mg by mouth daily as needed. 12/04/21   [provider]  methotrexate (RHEUMATREX) 2.5 MG tablet Take 20 mg by mouth every Sunday. (8 tablets) 08/21/18   [provider]  MULTIPLE VITAMIN-FOLIC ACID PO     [provider]  nitroGLYCERIN (NITROSTAT) 0.4 MG SL tablet PLACE 1 TABLET (0.4 MG TOTAL) UNDER THE TONGUE EVERY 5 (FIVE) MINUTES AS NEEDED FOR CHEST PAIN. 02/13/18   Theora Gianotti, NP  pantoprazole (PROTONIX) 40 MG tablet Take 1 tablet (40 mg total) by mouth 2 (two) times daily before a meal. 03/19/20   Lavina Hamman, MD  predniSONE (DELTASONE) 5 MG tablet Take 5 mg by mouth daily with breakfast.     [provider]  rosuvastatin (CRESTOR) 40 MG tablet TAKE 1 TABLET BY MOUTH EVERYDAY AT BEDTIME 01/16/21   Lorretta Harp, MD  sulfaSALAzine (AZULFIDINE) 500 MG EC tablet Take 500 mg by mouth 2 (two) times daily.    [provider]  traMADol (ULTRAM) 50 MG tablet Take 1-2 tablets (50-100 mg total) by mouth every 6 (six) hours as needed. 04/21/22   Mcarthur Rossetti, MD    Family History    Family History  Adopted: Yes  Problem Relation Age of Onset   Hypertension Mother    Hyperlipidemia Mother    Hyperlipidemia Father    Hypertension Father    is adopted.   Social History    Social  History   Socioeconomic History   Marital status: Married    Spouse name: Not on file   Number of children:  Not on file   Years of education: Not on file   Highest education level: Not on file  Occupational History   Occupation: retired  Tobacco Use   Smoking status: Never   Smokeless tobacco: Never  Vaping Use   Vaping Use: Never used  Substance and Sexual Activity   Alcohol use: Not Currently    Comment: rare glass of wine    Drug use: No   Sexual activity: Not on file  Other Topics Concern   Not on file  Social History Narrative   Right handed   Lives alone ( husband in rehab facility)   Caffeine- mostly coffee       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   Social Determinants of Health   Financial Resource Strain: Not on file  Food Insecurity: Not on file  Transportation Needs: Not on file  Physical Activity: Not on file  Stress: Not on file  Social Connections: Not on file  Intimate Partner Violence: Not on file     Review of Systems    General:  No chills, fever, night sweats or weight changes.  Cardiovascular:  No chest pain, dyspnea on exertion, edema, orthopnea, palpitations, paroxysmal nocturnal dyspnea. Dermatological: No rash, lesions/masses Respiratory: No cough, dyspnea Urologic: No hematuria, dysuria Abdominal:   No nausea, vomiting, diarrhea, bright red blood per rectum, melena, or hematemesis Neurologic:  No visual changes, wkns, changes in mental status. All other systems reviewed and are otherwise negative except as noted above.  Physical Exam    VS:  BP (!) 118/58   Pulse 80   Ht '5\' 3"'$  (1.6 m)   Wt 155 lb 3.2 oz (70.4 kg)   SpO2 97%   BMI 27.49 kg/m  , BMI Body mass index is 27.49 kg/m. GEN: Well nourished, well developed, in no acute distress. HEENT: normal. Neck: Supple, no JVD, carotid bruits, or masses. Cardiac: RRR, no murmurs, rubs, or gallops. No clubbing, cyanosis, edema.  Radials/DP/PT 2+ and equal bilaterally.  Respiratory:  Respirations regular and unlabored, clear to auscultation bilaterally. GI: Soft, nontender, nondistended, BS + x 4. MS: no deformity  or atrophy. Skin: warm and dry, no rash. Neuro:  Strength and sensation are intact. Psych: Normal affect.  Accessory Clinical Findings    Recent Labs: 01/07/2022: ALT 15; B Natriuretic Peptide 68.1; Hemoglobin 11.4; Platelets 265 02/16/2022: BUN 10; Creatinine, Ser 0.90; Potassium 4.2; Sodium 141   Recent Lipid Panel    Component Value Date/Time   CHOL 153 02/16/2022 1139   TRIG 53 02/16/2022 1139   HDL 53 02/16/2022 1139   CHOLHDL 2.9 02/16/2022 1139   CHOLHDL 3.5 03/19/2020 0029   VLDL 7 03/19/2020 0029   LDLCALC 89 02/16/2022 1139    ECG personally reviewed by me today-none today.  Echocardiogram 03/18/2020 IMPRESSIONS     1. Left ventricular ejection fraction, by estimation, is 70 to 75%. The  left ventricle has hyperdynamic function. The left ventricle has no  regional wall motion abnormalities. There is mild concentric left  ventricular hypertrophy. Left ventricular  diastolic parameters are consistent with Grade I diastolic dysfunction  (impaired relaxation).   2. Right ventricular systolic function is normal. The right ventricular  size is normal. There is normal pulmonary artery systolic pressure.   3. Left atrial size was mildly dilated.   4. The mitral valve is normal in structure. No evidence  of mitral valve  regurgitation.   5. The aortic valve is tricuspid. Aortic valve regurgitation is not  visualized. Mild aortic valve sclerosis is present, with no evidence of  aortic valve stenosis.   6. The inferior vena cava is normal in size with greater than 50%  respiratory variability, suggesting right atrial pressure of 3 mmHg.   Comparison(s): No significant change from prior study. Prior images  reviewed side by side.  Lower extremity ABIs 03/31/2020 Summary:  Right: Resting right ankle-brachial index indicates severe right lower  extremity arterial disease.  The right toe-brachial index is abnormal.  Significant decrease in the ABIs by .37.   Left: Resting  left ankle-brachial index is within normal range.  No evidence of significant left lower extremity arterial disease. The left  toe-brachial index is normal.       *See table(s) above for measurements and observations.  See LE Arterial duplex report.   Vascular consult recommended.  Appointment to see Dr. Gwenlyn Found on 04/08/2020.     Electronically signed by Ida Rogue MD on 03/31/2020 at 6:18:14 PM.  Assessment & Plan   1.  Coronary artery disease-no recent episodes of chest discomfort.  Had low risk nuclear stress test that did not show ischemia on 02/08/2022. Continue rosuvastatin, carvedilol, Plavix, Imdur Heart healthy low-sodium diet-salty 6 given Increase physical activity as tolerated  Peripheral arterial disease-denies lower extremity claudication.  Continues to have some difficulty with ambulation.  Lower extremity ABIs 03/31/2020 showed severe right lower extremity arterial disease 0.37 and normal left ABI with no evidence of significant lower extremity arterial disease. Repeat lower extremity arterial's and ABIs when clinically indicated Continue current medical therapy  Hyperlipidemia-LDL 89 on 02/16/2022 Continue rosuvastatin Heart healthy low-sodium high-fiber diet Increase physical activity as tolerated  Essential hypertension-BP today 118/58.  Well-controlled at home. Continue carvedilol, HCTZ Heart healthy low-sodium diet-salty 6 given Increase physical activity as tolerated Maintain blood pressure log  Follow-up with [name] in 6-9 months.  Jossie Ng. Evolet Salminen NP-C     06/18/2022, 11:06 AM [place] [address] Office [phone] Fax [phone]  Notice: This dictation was prepared with Dragon dictation along with smaller phrase technology. Any transcriptional errors that result from this process are unintentional and may not be corrected upon review.  I spent 13 minutes examining this patient, reviewing  medications, and using patient centered shared decision making involving her cardiac care.  Prior to her visit I spent greater than 20 minutes reviewing her past medical history,  medications, and prior cardiac tests.

## 2022-06-18 ENCOUNTER — Encounter: Payer: Self-pay | Admitting: General Practice

## 2022-06-18 ENCOUNTER — Ambulatory Visit: Payer: Medicare Other | Admitting: General Practice

## 2022-06-18 VITALS — BP 118/58 | HR 80 | Ht 63.0 in | Wt 155.2 lb

## 2022-06-18 DIAGNOSIS — I25119 Atherosclerotic heart disease of native coronary artery with unspecified angina pectoris: Secondary | ICD-10-CM | POA: Diagnosis not present

## 2022-06-18 DIAGNOSIS — E785 Hyperlipidemia, unspecified: Secondary | ICD-10-CM

## 2022-06-18 DIAGNOSIS — I739 Peripheral vascular disease, unspecified: Secondary | ICD-10-CM

## 2022-06-18 DIAGNOSIS — I10 Essential (primary) hypertension: Secondary | ICD-10-CM

## 2022-06-18 NOTE — Patient Instructions (Signed)
Medication Instructions:  The current medical regimen is effective;  continue present plan and medications as directed. Please refer to the Current Medication list given to you today.   *If you need a refill on your cardiac medications before your next appointment, please call your pharmacy*  Lab Work:   Testing/Procedures:  NONE    NONE  If you have labs (blood work) drawn today and your tests are completely normal, you will receive your results only by: [place] (if you have MyChart) OR  A paper copy in the mail If you have any lab test that is abnormal or we need to change your treatment, we will call you to review the results.  Special Instructions PLEASE TAKE AND LOG YOUR BLOOD PRESSURE AT LEAST 2 TIMES A WEEK-AT LEAST 1 HOUR AFTER TAKING YOUR MEDICATIONS  PLEASE CONTINUE PHYSICAL ACTIVITY AS TOLERATED   Follow-Up: Your next appointment:  6-9 month(s) In Person with [name], MD  If primary card or EP is not listed click here to update    :1     At [facility], you and your health needs are our priority.  As part of our continuing mission to provide you with exceptional heart care, we have created designated Provider Care Teams.  These Care Teams include your primary Cardiologist (physician) and Advanced Practice Providers (APPs -  Physician Assistants and Nurse Practitioners) who all work together to provide you with the care you need, when you need it.  We recommend signing up for the patient portal called "MyChart".  Sign up information is provided on this After Visit Summary.  MyChart is used to connect with patients for Virtual Visits (Telemedicine).  Patients are able to view lab/test results, encounter notes, upcoming appointments, etc.  Non-urgent messages can be sent to your provider as well.   To learn more about what you can do with MyChart, go to [email or web address].    Important Information About Sugar          BLOOD PRESSURE READINGS TAKE YOUR  BLOOD PRESSURE AT LEAST 1-2 HOUR AFTER TAKING YOUR MEDICATION BRING LOG WITH YOU TO YOUR FOLLOW UP APPOINTMENT FOR REVIEW DATE TIME BP HR COMMENT DATE  TIME  BP HR COMMENT  BLOOD PRESSURE READINGS TAKE YOUR BLOOD PRESSURE AT LEAST 1-2 HOUR AFTER TAKING YOUR MEDICATION BRING LOG WITH YOU TO YOUR FOLLOW UP APPOINTMENT FOR REVIEW DATE TIME BP HR COMMENT DATE  TIME  BP HR COMMENT

## 2022-07-09 ENCOUNTER — Other Ambulatory Visit: Payer: Self-pay | Admitting: Cardiovascular Disease

## 2022-08-12 ENCOUNTER — Encounter: Payer: Self-pay | Admitting: Orthopaedic Surgery

## 2022-08-12 ENCOUNTER — Ambulatory Visit: Payer: Medicare Other | Admitting: Orthopaedic Surgery

## 2022-08-12 DIAGNOSIS — M7632 Iliotibial band syndrome, left leg: Secondary | ICD-10-CM

## 2022-08-12 NOTE — Progress Notes (Signed)
The patient is an 88 year old female well-known to me.  She comes in with left lower extremity "heaviness" but she points to the IT band as a source of her pain.  She says is very tender there.  She is tender over the IT band to the lateral aspect of her knee but no significant knee joint tenderness.  It seems to be the proximal and distal IT band.  I recommended Voltaren gel to this area 3 times a day.  I also recommended trying a steroid injection at the point of maximal tenderness.  We drilled for steroid and then placed over the distal IT band by the lateral aspect of the knee without difficulty.  All questions and concerns were answered addressed.  Follow-up at this point can be as needed.

## 2022-09-01 ENCOUNTER — Ambulatory Visit: Payer: Medicare Other | Admitting: Podiatry

## 2022-09-01 ENCOUNTER — Encounter: Payer: Self-pay | Admitting: Podiatry

## 2022-09-01 DIAGNOSIS — I739 Peripheral vascular disease, unspecified: Secondary | ICD-10-CM | POA: Diagnosis not present

## 2022-09-01 DIAGNOSIS — E1151 Type 2 diabetes mellitus with diabetic peripheral angiopathy without gangrene: Secondary | ICD-10-CM | POA: Diagnosis not present

## 2022-09-01 NOTE — Progress Notes (Signed)
This patient returns to my office for at risk foot care.  This patient requires this care by a professional since this patient will be at risk due to having diabetes,  PAD  and coagulation defect.  Patient is taking plavix. Patient says she has painful long thick nails especially her big toenail right foot.   This patient is unable to cut nails herself since the patient cannot reach her nails.These nails are painful walking and wearing shoes.  This patient presents for at risk foot care today.  General Appearance  Alert, conversant and in no acute stress.  Vascular  Dorsalis pedis and posterior tibial  pulses are not  palpable  bilaterally. Absent digital hair  B/L.  Capillary return is within normal limits  bilaterally. Temperature is within normal limits  bilaterally. Venous stasis  B/l.  Neurologic  Senn-Weinstein monofilament wire test within normal limits  bilaterally. Muscle power within normal limits bilaterally.  Nails Thick disfigured discolored nails with subungual debris  from hallux to fifth toes bilaterally. No evidence of bacterial infection or drainage bilaterally.  Orthopedic  No limitations of motion  feet .  No crepitus or effusions noted.  No bony pathology or digital deformities noted.  Skin  normotropic skin with no porokeratosis noted bilaterally.  No signs of infections or ulcers noted.     Onychomycosis  Pain in right toes  Pain in left toes    Consent was obtained for treatment procedures.   Mechanical debridement of nails 1-5  bilaterally performed with a nail nipper.  Filed with dremel without incident.  Right hallux was anesthetized before treatment.   Return office visit    3 months                 Told patient to return for periodic foot care and evaluation due to potential at risk complications.   Gardiner Barefoot DPM

## 2022-09-07 ENCOUNTER — Other Ambulatory Visit: Payer: Self-pay | Admitting: Cardiovascular Disease

## 2022-10-19 ENCOUNTER — Observation Stay (HOSPITAL_COMMUNITY)
Admission: EM | Admit: 2022-10-19 | Discharge: 2022-10-23 | Disposition: A | Discharge status: Home Health | Payer: Medicare Other | Attending: Emergency Medicine | Admitting: Emergency Medicine

## 2022-10-19 ENCOUNTER — Other Ambulatory Visit: Payer: Self-pay

## 2022-10-19 ENCOUNTER — Encounter (HOSPITAL_COMMUNITY): Payer: Self-pay

## 2022-10-19 ENCOUNTER — Emergency Department (HOSPITAL_COMMUNITY): Payer: Medicare Other

## 2022-10-19 DIAGNOSIS — Z8673 Personal history of transient ischemic attack (TIA), and cerebral infarction without residual deficits: Secondary | ICD-10-CM | POA: Insufficient documentation

## 2022-10-19 DIAGNOSIS — Z7902 Long term (current) use of antithrombotics/antiplatelets: Secondary | ICD-10-CM | POA: Diagnosis not present

## 2022-10-19 DIAGNOSIS — Z951 Presence of aortocoronary bypass graft: Secondary | ICD-10-CM | POA: Diagnosis not present

## 2022-10-19 DIAGNOSIS — Z79899 Other long term (current) drug therapy: Secondary | ICD-10-CM | POA: Diagnosis not present

## 2022-10-19 DIAGNOSIS — I10 Essential (primary) hypertension: Secondary | ICD-10-CM | POA: Diagnosis present

## 2022-10-19 DIAGNOSIS — I2510 Atherosclerotic heart disease of native coronary artery without angina pectoris: Secondary | ICD-10-CM | POA: Diagnosis not present

## 2022-10-19 DIAGNOSIS — F419 Anxiety disorder, unspecified: Secondary | ICD-10-CM | POA: Diagnosis present

## 2022-10-19 DIAGNOSIS — E785 Hyperlipidemia, unspecified: Secondary | ICD-10-CM | POA: Diagnosis present

## 2022-10-19 DIAGNOSIS — E11649 Type 2 diabetes mellitus with hypoglycemia without coma: Secondary | ICD-10-CM | POA: Insufficient documentation

## 2022-10-19 DIAGNOSIS — Z1152 Encounter for screening for COVID-19: Secondary | ICD-10-CM | POA: Insufficient documentation

## 2022-10-19 DIAGNOSIS — I5032 Chronic diastolic (congestive) heart failure: Secondary | ICD-10-CM | POA: Diagnosis present

## 2022-10-19 DIAGNOSIS — E119 Type 2 diabetes mellitus without complications: Secondary | ICD-10-CM

## 2022-10-19 DIAGNOSIS — Z95820 Peripheral vascular angioplasty status with implants and grafts: Secondary | ICD-10-CM | POA: Insufficient documentation

## 2022-10-19 DIAGNOSIS — I160 Hypertensive urgency: Principal | ICD-10-CM | POA: Diagnosis present

## 2022-10-19 DIAGNOSIS — M069 Rheumatoid arthritis, unspecified: Secondary | ICD-10-CM | POA: Diagnosis present

## 2022-10-19 DIAGNOSIS — I169 Hypertensive crisis, unspecified: Secondary | ICD-10-CM | POA: Diagnosis not present

## 2022-10-19 DIAGNOSIS — Z96643 Presence of artificial hip joint, bilateral: Secondary | ICD-10-CM | POA: Insufficient documentation

## 2022-10-19 DIAGNOSIS — I110 Hypertensive heart disease with heart failure: Secondary | ICD-10-CM | POA: Insufficient documentation

## 2022-10-19 DIAGNOSIS — E1169 Type 2 diabetes mellitus with other specified complication: Secondary | ICD-10-CM | POA: Diagnosis present

## 2022-10-19 DIAGNOSIS — R519 Headache, unspecified: Secondary | ICD-10-CM | POA: Insufficient documentation

## 2022-10-19 DIAGNOSIS — R112 Nausea with vomiting, unspecified: Secondary | ICD-10-CM | POA: Insufficient documentation

## 2022-10-19 DIAGNOSIS — E1140 Type 2 diabetes mellitus with diabetic neuropathy, unspecified: Secondary | ICD-10-CM | POA: Diagnosis not present

## 2022-10-19 DIAGNOSIS — Z7984 Long term (current) use of oral hypoglycemic drugs: Secondary | ICD-10-CM | POA: Diagnosis not present

## 2022-10-19 DIAGNOSIS — Z91040 Latex allergy status: Secondary | ICD-10-CM | POA: Diagnosis not present

## 2022-10-19 DIAGNOSIS — I739 Peripheral vascular disease, unspecified: Secondary | ICD-10-CM | POA: Diagnosis present

## 2022-10-19 DIAGNOSIS — I25119 Atherosclerotic heart disease of native coronary artery with unspecified angina pectoris: Secondary | ICD-10-CM | POA: Diagnosis present

## 2022-10-19 LAB — COMPREHENSIVE METABOLIC PANEL
ALT: 18 U/L (ref 0–44)
AST: 24 U/L (ref 15–41)
Albumin: 3.9 g/dL (ref 3.5–5.0)
Alkaline Phosphatase: 39 U/L (ref 38–126)
Anion gap: 4 — ABNORMAL LOW (ref 5–15)
BUN: 10 mg/dL (ref 8–23)
CO2: 30 mmol/L (ref 22–32)
Calcium: 9.2 mg/dL (ref 8.9–10.3)
Chloride: 104 mmol/L (ref 98–111)
Creatinine, Ser: 0.76 mg/dL (ref 0.44–1.00)
GFR, Estimated: >60 mL/min (ref >60)
Glucose, Bld: 180 mg/dL — ABNORMAL HIGH (ref 70–99)
Potassium: 4 mmol/L (ref 3.5–5.1)
Sodium: 138 mmol/L (ref 135–145)
Total Bilirubin: 0.6 mg/dL (ref 0.3–1.2)
Total Protein: 7.2 g/dL (ref 6.5–8.1)

## 2022-10-19 LAB — TROPONIN I (HIGH SENSITIVITY)
Troponin I (High Sensitivity): 9 ng/L (ref <18)
Troponin I (High Sensitivity): 7 ng/L (ref <18)

## 2022-10-19 LAB — URINALYSIS, ROUTINE W REFLEX MICROSCOPIC
Bilirubin Urine: NEGATIVE
Glucose, UA: NEGATIVE mg/dL
Hgb urine dipstick: NEGATIVE
Ketones, ur: NEGATIVE mg/dL
Leukocytes,Ua: NEGATIVE
Nitrite: NEGATIVE
Protein, ur: NEGATIVE mg/dL
Specific Gravity, Urine: 1.004 — ABNORMAL LOW (ref 1.005–1.030)
pH: 7 (ref 5.0–8.0)

## 2022-10-19 LAB — CBC
HCT: 38.2 % (ref 36.0–46.0)
Hemoglobin: 11.5 g/dL — ABNORMAL LOW (ref 12.0–15.0)
MCH: 27.8 pg (ref 26.0–34.0)
MCHC: 30.1 g/dL (ref 30.0–36.0)
MCV: 92.3 fL (ref 80.0–100.0)
Platelets: 219 10*3/uL (ref 150–400)
RBC: 4.14 MIL/uL (ref 3.87–5.11)
RDW: 13.9 % (ref 11.5–15.5)
WBC: 5.7 10*3/uL (ref 4.0–10.5)
nRBC: 0 % (ref 0.0–0.2)

## 2022-10-19 LAB — RESP PANEL BY RT-PCR (FLU A&B, COVID) ARPGX2
Influenza A by PCR: NEGATIVE
Influenza B by PCR: NEGATIVE
SARS Coronavirus 2 by RT PCR: NEGATIVE

## 2022-10-19 LAB — CBG MONITORING, ED: Glucose-Capillary: 148 mg/dL — ABNORMAL HIGH (ref 70–99)

## 2022-10-19 LAB — LIPASE, BLOOD: Lipase: 29 U/L (ref 11–51)

## 2022-10-19 MED ORDER — ONDANSETRON 4 MG PO TBDP
4.0000 mg | ORAL_TABLET | Freq: Once | ORAL | Status: AC
  Administered 2022-10-19: 4 mg via ORAL
  Filled 2022-10-19: qty 1

## 2022-10-19 MED ORDER — CARVEDILOL 6.25 MG PO TABS
6.2500 mg | ORAL_TABLET | Freq: Two times a day (BID) | ORAL | Status: DC
  Administered 2022-10-19 – 2022-10-23 (×8): 6.25 mg via ORAL
  Filled 2022-10-19 (×2): qty 1
  Filled 2022-10-19: qty 2
  Filled 2022-10-19 (×4): qty 1
  Filled 2022-10-19: qty 2

## 2022-10-19 MED ORDER — HYDROCHLOROTHIAZIDE 12.5 MG PO TABS
12.5000 mg | ORAL_TABLET | Freq: Once | ORAL | Status: AC
  Administered 2022-10-19: 12.5 mg via ORAL
  Filled 2022-10-19: qty 1

## 2022-10-19 MED ORDER — ACETAMINOPHEN 325 MG PO TABS
650.0000 mg | ORAL_TABLET | Freq: Once | ORAL | Status: AC
  Administered 2022-10-19: 650 mg via ORAL
  Filled 2022-10-19: qty 2

## 2022-10-19 MED ORDER — ISOSORBIDE MONONITRATE ER 60 MG PO TB24: 90.0000 mg | ORAL_TABLET | Freq: Every day | ORAL | Status: DC

## 2022-10-19 MED ORDER — METOCLOPRAMIDE HCL 5 MG/ML IJ SOLN
10.0000 mg | Freq: Once | INTRAMUSCULAR | Status: AC
  Administered 2022-10-19: 10 mg via INTRAVENOUS
  Filled 2022-10-19: qty 2

## 2022-10-19 MED ORDER — NITROGLYCERIN 2 % TD OINT
1.0000 [in_us] | TOPICAL_OINTMENT | Freq: Once | TRANSDERMAL | Status: AC
  Administered 2022-10-19: 1 [in_us] via TOPICAL
  Filled 2022-10-19: qty 1

## 2022-10-19 NOTE — ED Notes (Signed)
Patient has a urine culture in main lab 

## 2022-10-19 NOTE — ED Notes (Signed)
Per EMT first PA aware of elevated BP and left neck pain.

## 2022-10-19 NOTE — ED Triage Notes (Signed)
Pt to er via ems, per ems pt has been having nausea for the past two weeks, denies vomiting, states that pt has an ultrasound appt tomorrow.  Pt states that she has been feeling poorly for the past week.  Pt denies pain, pt reports nausea.

## 2022-10-19 NOTE — ED Provider Notes (Signed)
[facility] DEPT Provider Note   CSN: [id number] Arrival date & time: 10/19/22  1310     History {Add pertinent medical, surgical, social history, OB history to HPI:1} Chief Complaint  Patient presents with   Nausea    [name] is a 88 y.o. female.  HPI Patient presents for near syncope.  Medical history includes CAD, PAD, HLD, HTN, CHF, DM, anemia, CVA, anxiety, neuropathy, PUD, GERD.  She has had nausea over the past 2 weeks.  This seems to come and go.  It is not worsened with eating.  She has not had any vomiting.  She will occasionally have associated dizziness.  She went to her PCP last week.  She underwent lab work and was told that one of her enzymes was abnormal.  She was scheduled for an ultrasound.  She is not sure what they were going to ultrasound.  She was prescribed a nausea medication which was ineffective.  Earlier today, patient was at the store.  She had recurrence of her nausea that was severe.  She had associated dizziness and felt that she might pass out.  At the time, she was with her daughter who called 911.  Patient endorses pain in the left lateral aspect of her neck.  She states that this has been intermittent as well.  It was severe earlier today but has subsided.  She also endorses a generalized headache.  She has current nausea.  She states that she has taken her blood pressure medications today.    Home Medications Prior to Admission medications   Medication Sig Start Date End Date Taking? Authorizing Provider  acetaminophen (TYLENOL) 650 MG CR tablet Take 650 mg by mouth every 8 (eight) hours as needed for pain.    [provider]  acetaminophen-codeine (TYLENOL #3) 300-30 MG tablet Take 1-2 tablets by mouth every 8 (eight) hours as needed. 03/20/21   Mcarthur Rossetti, MD  alendronate (FOSAMAX) 70 MG tablet 1 TABLET 30 MINUTES BEFORE THE FIRST FOOD, BEVERAGE OR MEDICINE OF THE DAY WITH PLAIN WATER ORALLY ONCE A  WEEK 90 DAYS ORALLY ONCE A WEEK 84 DAYS    [provider]  carvedilol (COREG) 6.25 MG tablet TAKE 1 TABLET BY MOUTH TWICE A DAY 09/07/22   Lorretta Harp, MD  Cholecalciferol (VITAMIN D3) 10 MCG (400 UNIT) tablet Take 400 Units by mouth daily.    [provider]  clopidogrel (PLAVIX) 75 MG tablet Take 75 mg by mouth daily.    [provider]  Coenzyme Q10 (CO Q 10 PO) Take 1 tablet by mouth every other day.     [provider]  ezetimibe (ZETIA) 10 MG tablet Take 10 mg by mouth at bedtime.    [provider]  folic acid (FOLVITE) 1 MG tablet Take 1 mg by mouth daily. 08/21/18   [provider]  glimepiride (AMARYL) 2 MG tablet Take 2 mg by mouth daily as needed. 01/07/22   [provider]  hydrochlorothiazide (MICROZIDE) 12.5 MG capsule Take 1 capsule (12.5 mg total) by mouth daily. 02/04/22 06/18/22  Warren Lacy, PA-C  isosorbide mononitrate (IMDUR) 60 MG 24 hr tablet TAKE 1.5 TABLETS (90 MG TOTAL) BY MOUTH DAILY. Marland Kitchen 04/12/22   Lorretta Harp, MD  LORazepam (ATIVAN) 0.5 MG tablet Take 0.5 mg by mouth daily as needed. 12/04/21   [provider]  methotrexate (RHEUMATREX) 2.5 MG tablet Take 20 mg by mouth every Sunday. (8 tablets) 08/21/18  [provider]  MULTIPLE VITAMIN-FOLIC ACID PO     [provider]  nitroGLYCERIN (NITROSTAT) 0.4 MG SL tablet PLACE 1 TABLET (0.4 MG TOTAL) UNDER THE TONGUE EVERY 5 (FIVE) MINUTES AS NEEDED FOR CHEST PAIN. 02/13/18   Theora Gianotti, NP  pantoprazole (PROTONIX) 40 MG tablet Take 1 tablet (40 mg total) by mouth 2 (two) times daily before a meal. 03/19/20   Lavina Hamman, MD  predniSONE (DELTASONE) 5 MG tablet Take 5 mg by mouth daily with breakfast.     [provider]  rosuvastatin (CRESTOR) 40 MG tablet TAKE 1 TABLET BY MOUTH EVERYDAY AT BEDTIME 01/16/21   Lorretta Harp, MD  sulfaSALAzine (AZULFIDINE) 500 MG EC tablet Take 500 mg by mouth 2 (two)  times daily.    [provider]      Allergies    Fish allergy, Iodinated contrast media, Latex, Shellfish allergy, Amlodipine, Evolocumab, Other, and Metformin    Review of Systems   Review of Systems  Constitutional:  Positive for fatigue.  Gastrointestinal:  Positive for nausea.  Musculoskeletal:  Positive for neck pain.  Neurological:  Positive for dizziness, weakness (Generalized) and headaches.  All other systems reviewed and are negative.   Physical Exam Updated Vital Signs BP (!) 251/117   Pulse 67   Temp 98.3 F (36.8 C) (Oral)   Resp 16   Ht '5\' 3"'$  (1.6 m)   Wt 69.9 kg   SpO2 96%   BMI 27.28 kg/m  Physical Exam Vitals and nursing note reviewed.  Constitutional:      General: She is not in acute distress.    Appearance: She is well-developed and normal weight. She is not ill-appearing, toxic-appearing or diaphoretic.  HENT:     Head: Normocephalic and atraumatic.     Right Ear: External ear normal.     Left Ear: External ear normal.     Nose: Nose normal.     Mouth/Throat:     Mouth: Mucous membranes are moist.     Pharynx: Oropharynx is clear.  Eyes:     Extraocular Movements: Extraocular movements intact.     Conjunctiva/sclera: Conjunctivae normal.  Cardiovascular:     Rate and Rhythm: Normal rate and regular rhythm.  Pulmonary:     Effort: Pulmonary effort is normal. No respiratory distress.  Abdominal:     General: There is no distension.     Palpations: Abdomen is soft.     Tenderness: There is no abdominal tenderness.  Musculoskeletal:        General: No swelling.     Cervical back: Normal range of motion and neck supple.     Right lower leg: No edema.     Left lower leg: No edema.  Skin:    General: Skin is warm and dry.     Capillary Refill: Capillary refill takes less than 2 seconds.     Coloration: Skin is not jaundiced or pale.  Neurological:     General: No focal deficit present.     Mental Status: She is alert and oriented  to person, place, and time.     Cranial Nerves: No cranial nerve deficit.     Sensory: No sensory deficit.     Motor: No weakness.     Coordination: Coordination normal.  Psychiatric:        Mood and Affect: Mood normal.        Behavior: Behavior normal.        Thought Content: Thought  content normal.        Judgment: Judgment normal.     ED Results / Procedures / Treatments   Labs (all labs ordered are listed, but only abnormal results are displayed) Labs Reviewed  COMPREHENSIVE METABOLIC PANEL - Abnormal; Notable for the following components:      Result Value   Glucose, Bld 180 (*)    Anion gap 4 (*)    All other components within normal limits  CBC - Abnormal; Notable for the following components:   Hemoglobin 11.5 (*)    All other components within normal limits  URINALYSIS, ROUTINE W REFLEX MICROSCOPIC - Abnormal; Notable for the following components:   Color, Urine STRAW (*)    Specific Gravity, Urine 1.004 (*)    All other components within normal limits  CBG MONITORING, ED - Abnormal; Notable for the following components:   Glucose-Capillary 148 (*)    All other components within normal limits  RESP PANEL BY RT-PCR (FLU A&B, COVID) ARPGX2  LIPASE, BLOOD  TROPONIN I (HIGH SENSITIVITY)  TROPONIN I (HIGH SENSITIVITY)    EKG None  Radiology CT Head Wo Contrast  Result Date: 10/19/2022 CLINICAL DATA:  Headache elevated blood pressure EXAM: CT HEAD WITHOUT CONTRAST TECHNIQUE: Contiguous axial images were obtained from the base of the skull through the vertex without intravenous contrast. RADIATION DOSE REDUCTION: This exam was performed according to the departmental dose-optimization program which includes automated exposure control, adjustment of the mA and/or kV according to patient size and/or use of iterative reconstruction technique. COMPARISON:  CT brain 01/07/2022 FINDINGS: Brain: No acute territorial infarction, hemorrhage, or intracranial mass. Stable ventricle  size. Mild atrophy. Vascular: No hyperdense vessels. Vertebral and carotid vascular calcification. Skull: Normal. Negative for fracture or focal lesion. Sinuses/Orbits: No acute finding. Other: None IMPRESSION: No CT evidence for acute intracranial abnormality. Atrophy. Electronically Signed   By: Donavan Foil M.D.   On: 10/19/2022 20:29   DG Chest 2 View  Result Date: 10/19/2022 CLINICAL DATA:  Shortness of breath. EXAM: CHEST - 2 VIEW COMPARISON:  January 07, 2022. FINDINGS: Stable cardiomediastinal silhouette. Status post coronary bypass graft. Minimal left lingular subsegmental atelectasis is noted. Mild right basilar subsegmental atelectasis is noted. Bony thorax is unremarkable. IMPRESSION: Minimal to mild bilateral subsegmental atelectasis. Electronically Signed   By: Marijo Conception M.D.   On: 10/19/2022 18:10    Procedures Procedures  {Document cardiac monitor, telemetry assessment procedure when appropriate:1}  Medications Ordered in ED Medications  carvedilol (COREG) tablet 6.25 mg (6.25 mg Oral Given 10/19/22 1938)  ondansetron (ZOFRAN-ODT) disintegrating tablet 4 mg (4 mg Oral Given 10/19/22 1937)  hydrochlorothiazide (HYDRODIURIL) tablet 12.5 mg (12.5 mg Oral Given 10/19/22 1937)    ED Course/ Medical Decision Making/ A&P                           Medical Decision Making Amount and/or Complexity of Data Reviewed Labs: ordered. Radiology: ordered.  Risk OTC drugs. Prescription drug management.   ***  {Document critical care time when appropriate:1} {Document review of labs and clinical decision tools ie heart score, Chads2Vasc2 etc:1}  {Document your independent review of radiology images, and any outside records:1} {Document your discussion with family members, caretakers, and with consultants:1} {Document social determinants of health affecting pt's care:1} {Document your decision making why or why not admission, treatments were needed:1} Final Clinical  Impression(s) / ED Diagnoses Final diagnoses:  None    Rx / DC Orders  ED Discharge Orders     None

## 2022-10-19 NOTE — ED Provider Triage Note (Signed)
Emergency Medicine Provider Triage Evaluation Note  [name] , a 88 y.o. female  was evaluated in triage.  Pt complains of nausea, without vomiting, reports that she has an ultrasound appointment tomorrow but cannot tell me for what.  She reports that she had some kind of an abnormal enzyme and lab work yesterday.  I cannot see any record of this lab work in my system to elucidate.  She arrives hypertensive, but denies any chest pain, shortness of breath.  She denies any diarrhea, constipation, fever, chills.  Review of Systems  Positive: Nausea, feeling sick Negative: Abdominal pain, fever, chills, vomiting, diarrhea, constipation  Physical Exam  BP (!) 201/153   Pulse 70   Temp 98.4 F (36.9 C) (Oral)   Resp 18   Ht '5\' 3"'$  (1.6 m)   Wt 69.9 kg   SpO2 99%   BMI 27.28 kg/m  Gen:   Awake, no distress   Resp:  Normal effort  MSK:   Moves extremities without difficulty  Other:  Patient expresses some discomfort when I press on her abdomen but denies tenderness, no rebound, rigidity, guarding  Medical Decision Making  Medically screening exam initiated at 2:43 PM.  Appropriate orders placed.  [name] was informed that the remainder of the evaluation will be completed by another provider, this initial triage assessment does not replace that evaluation, and the importance of remaining in the ED until their evaluation is complete.  Workup initiated   Anselmo Pickler, [place] 10/19/22 1444

## 2022-10-20 ENCOUNTER — Encounter (HOSPITAL_COMMUNITY): Payer: Self-pay | Admitting: Family Medicine

## 2022-10-20 DIAGNOSIS — I160 Hypertensive urgency: Secondary | ICD-10-CM | POA: Diagnosis not present

## 2022-10-20 DIAGNOSIS — R112 Nausea with vomiting, unspecified: Secondary | ICD-10-CM | POA: Insufficient documentation

## 2022-10-20 LAB — COMPREHENSIVE METABOLIC PANEL
ALT: 17 U/L (ref 0–44)
AST: 25 U/L (ref 15–41)
Albumin: 3.3 g/dL — ABNORMAL LOW (ref 3.5–5.0)
Alkaline Phosphatase: 35 U/L — ABNORMAL LOW (ref 38–126)
Anion gap: 9 (ref 5–15)
BUN: 9 mg/dL (ref 8–23)
CO2: 27 mmol/L (ref 22–32)
Calcium: 8.9 mg/dL (ref 8.9–10.3)
Chloride: 104 mmol/L (ref 98–111)
Creatinine, Ser: 0.8 mg/dL (ref 0.44–1.00)
GFR, Estimated: >60 mL/min (ref >60)
Glucose, Bld: 145 mg/dL — ABNORMAL HIGH (ref 70–99)
Potassium: 3.8 mmol/L (ref 3.5–5.1)
Sodium: 140 mmol/L (ref 135–145)
Total Bilirubin: 0.9 mg/dL (ref 0.3–1.2)
Total Protein: 6.6 g/dL (ref 6.5–8.1)

## 2022-10-20 LAB — GLUCOSE, CAPILLARY
Glucose-Capillary: 207 mg/dL — ABNORMAL HIGH (ref 70–99)
Glucose-Capillary: 39 mg/dL — CL (ref 70–99)
Glucose-Capillary: 41 mg/dL — CL (ref 70–99)
Glucose-Capillary: 183 mg/dL — ABNORMAL HIGH (ref 70–99)

## 2022-10-20 LAB — HEMOGLOBIN A1C
Hgb A1c MFr Bld: 8.4 % — ABNORMAL HIGH (ref 4.8–5.6)
Mean Plasma Glucose: 194.38 mg/dL

## 2022-10-20 LAB — CBC WITH DIFFERENTIAL/PLATELET
Abs Immature Granulocytes: 0.01 10*3/uL (ref 0.00–0.07)
Basophils Absolute: 0 10*3/uL (ref 0.0–0.1)
Basophils Relative: 1 %
Eosinophils Absolute: 0.1 10*3/uL (ref 0.0–0.5)
Eosinophils Relative: 2 %
HCT: 38.5 % (ref 36.0–46.0)
Hemoglobin: 11.8 g/dL — ABNORMAL LOW (ref 12.0–15.0)
Immature Granulocytes: 0 %
Lymphocytes Relative: 30 %
Lymphs Abs: 1.3 10*3/uL (ref 0.7–4.0)
MCH: 27.8 pg (ref 26.0–34.0)
MCHC: 30.6 g/dL (ref 30.0–36.0)
MCV: 90.8 fL (ref 80.0–100.0)
Monocytes Absolute: 0.4 10*3/uL (ref 0.1–1.0)
Monocytes Relative: 9 %
Neutro Abs: 2.5 10*3/uL (ref 1.7–7.7)
Neutrophils Relative %: 58 %
Platelets: 223 10*3/uL (ref 150–400)
RBC: 4.24 MIL/uL (ref 3.87–5.11)
RDW: 14 % (ref 11.5–15.5)
WBC: 4.3 10*3/uL (ref 4.0–10.5)
nRBC: 0 % (ref 0.0–0.2)

## 2022-10-20 LAB — BRAIN NATRIURETIC PEPTIDE: B Natriuretic Peptide: 100.5 pg/mL — ABNORMAL HIGH (ref 0.0–100.0)

## 2022-10-20 MED ORDER — HYDRALAZINE HCL 25 MG PO TABS
25.0000 mg | ORAL_TABLET | Freq: Three times a day (TID) | ORAL | Status: DC
  Administered 2022-10-20 – 2022-10-22 (×7): 25 mg via ORAL
  Filled 2022-10-20 (×7): qty 1

## 2022-10-20 MED ORDER — PROCHLORPERAZINE EDISYLATE 10 MG/2ML IJ SOLN
10.0000 mg | Freq: Four times a day (QID) | INTRAMUSCULAR | Status: DC | PRN
  Administered 2022-10-22: 10 mg via INTRAVENOUS
  Filled 2022-10-20: qty 2

## 2022-10-20 MED ORDER — METHOTREXATE 2.5 MG PO TABS: 20.0000 mg | ORAL_TABLET | ORAL | Status: DC

## 2022-10-20 MED ORDER — FOLIC ACID 1 MG PO TABS
1.0000 mg | ORAL_TABLET | Freq: Every day | ORAL | Status: DC
  Administered 2022-10-20 – 2022-10-23 (×4): 1 mg via ORAL
  Filled 2022-10-20 (×4): qty 1

## 2022-10-20 MED ORDER — ISOSORBIDE MONONITRATE ER 60 MG PO TB24: 60.0000 mg | ORAL_TABLET | Freq: Every day | ORAL | Status: DC

## 2022-10-20 MED ORDER — PANTOPRAZOLE SODIUM 40 MG PO TBEC
40.0000 mg | DELAYED_RELEASE_TABLET | Freq: Two times a day (BID) | ORAL | Status: DC
  Administered 2022-10-20 – 2022-10-23 (×6): 40 mg via ORAL
  Filled 2022-10-20 (×6): qty 1

## 2022-10-20 MED ORDER — ACETAMINOPHEN 325 MG PO TABS
650.0000 mg | ORAL_TABLET | Freq: Three times a day (TID) | ORAL | Status: DC | PRN
  Administered 2022-10-20 – 2022-10-23 (×6): 650 mg via ORAL
  Filled 2022-10-20 (×7): qty 2

## 2022-10-20 MED ORDER — ALUM & MAG HYDROXIDE-SIMETH 200-200-20 MG/5ML PO SUSP
30.0000 mL | Freq: Once | ORAL | Status: AC
  Administered 2022-10-20: 30 mL via ORAL
  Filled 2022-10-20: qty 30

## 2022-10-20 MED ORDER — INSULIN ASPART 100 UNIT/ML IJ SOLN
0.0000 [IU] | Freq: Every day | INTRAMUSCULAR | Status: DC
  Filled 2022-10-20: qty 0.05

## 2022-10-20 MED ORDER — FAMOTIDINE IN NACL 20-0.9 MG/50ML-% IV SOLN
20.0000 mg | Freq: Once | INTRAVENOUS | Status: AC
  Administered 2022-10-20: 20 mg via INTRAVENOUS
  Filled 2022-10-20: qty 50

## 2022-10-20 MED ORDER — DEXTROSE 50 % IV SOLN
50.0000 mL | INTRAVENOUS | Status: AC
  Administered 2022-10-20: 50 mL via INTRAVENOUS
  Filled 2022-10-20: qty 50

## 2022-10-20 MED ORDER — LABETALOL HCL 5 MG/ML IV SOLN
10.0000 mg | Freq: Once | INTRAVENOUS | Status: AC
  Administered 2022-10-20: 10 mg via INTRAVENOUS
  Filled 2022-10-20: qty 4

## 2022-10-20 MED ORDER — LORAZEPAM 0.5 MG PO TABS: 0.5000 mg | ORAL_TABLET | Freq: Every day | ORAL | Status: DC | PRN

## 2022-10-20 MED ORDER — ROSUVASTATIN CALCIUM 20 MG PO TABS
40.0000 mg | ORAL_TABLET | Freq: Every day | ORAL | Status: DC
  Administered 2022-10-20 – 2022-10-23 (×4): 40 mg via ORAL
  Filled 2022-10-20 (×4): qty 2

## 2022-10-20 MED ORDER — CLOPIDOGREL BISULFATE 75 MG PO TABS
75.0000 mg | ORAL_TABLET | Freq: Every day | ORAL | Status: DC
  Administered 2022-10-20 – 2022-10-23 (×4): 75 mg via ORAL
  Filled 2022-10-20 (×4): qty 1

## 2022-10-20 MED ORDER — EZETIMIBE 10 MG PO TABS
10.0000 mg | ORAL_TABLET | Freq: Every day | ORAL | Status: DC
  Administered 2022-10-20 – 2022-10-22 (×3): 10 mg via ORAL
  Filled 2022-10-20 (×4): qty 1

## 2022-10-20 MED ORDER — PREDNISONE 5 MG PO TABS
5.0000 mg | ORAL_TABLET | Freq: Every day | ORAL | Status: DC
  Administered 2022-10-21 – 2022-10-23 (×3): 5 mg via ORAL
  Filled 2022-10-20 (×3): qty 1

## 2022-10-20 MED ORDER — LIDOCAINE VISCOUS HCL 2 % MT SOLN
15.0000 mL | Freq: Once | OROMUCOSAL | Status: AC
  Administered 2022-10-20: 15 mL via ORAL
  Filled 2022-10-20: qty 15

## 2022-10-20 MED ORDER — SULFASALAZINE 500 MG PO TBEC
500.0000 mg | DELAYED_RELEASE_TABLET | Freq: Two times a day (BID) | ORAL | Status: DC
  Administered 2022-10-20 – 2022-10-23 (×7): 500 mg via ORAL
  Filled 2022-10-20 (×7): qty 1

## 2022-10-20 MED ORDER — HYDRALAZINE HCL 20 MG/ML IJ SOLN
10.0000 mg | Freq: Four times a day (QID) | INTRAMUSCULAR | Status: DC | PRN
  Administered 2022-10-20: 10 mg via INTRAVENOUS
  Filled 2022-10-20 (×2): qty 1

## 2022-10-20 MED ORDER — LABETALOL HCL 5 MG/ML IV SOLN: 10.0000 mg | INTRAVENOUS | Status: DC | PRN

## 2022-10-20 MED ORDER — INSULIN ASPART 100 UNIT/ML IJ SOLN
0.0000 [IU] | Freq: Three times a day (TID) | INTRAMUSCULAR | Status: DC
  Administered 2022-10-20: 5 [IU] via SUBCUTANEOUS
  Administered 2022-10-21: 3 [IU] via SUBCUTANEOUS
  Administered 2022-10-21: 5 [IU] via SUBCUTANEOUS
  Administered 2022-10-22 (×2): 3 [IU] via SUBCUTANEOUS
  Administered 2022-10-22: 8 [IU] via SUBCUTANEOUS
  Administered 2022-10-23 (×2): 2 [IU] via SUBCUTANEOUS
  Filled 2022-10-20: qty 0.15

## 2022-10-20 MED ORDER — GABAPENTIN 400 MG PO CAPS
400.0000 mg | ORAL_CAPSULE | Freq: Two times a day (BID) | ORAL | Status: DC
  Administered 2022-10-20 – 2022-10-23 (×7): 400 mg via ORAL
  Filled 2022-10-20 (×7): qty 1

## 2022-10-20 MED ORDER — NITROGLYCERIN 2 % TD OINT: 1.0000 [in_us] | TOPICAL_OINTMENT | Freq: Once | TRANSDERMAL | Status: DC

## 2022-10-20 NOTE — Progress Notes (Signed)
Hypoglycemic Event  CBG: 39  Treatment: 8 oz juice/soda  Symptoms: None  Follow-up CBG: Time:2226 CBG Result:41  Possible Reasons for Event: Unknown  Comments/MD notified: D50 50 ml given   Repeat CBG 183  A. Zebedee Iba, NP notified   [place] W Nicki Gracy

## 2022-10-20 NOTE — ED Notes (Signed)
SBAR in. Let me know if you have any questions. Thanks

## 2022-10-20 NOTE — H&P (Signed)
History and Physical    Patient: [name]:[id number] DOB: [birth year] DOA: 10/19/2022 DOS: the patient was seen and examined on 10/20/2022 PCP: Janie Morning, DO  Patient coming from: Home  Chief Complaint:  Chief Complaint  Patient presents with   Nausea   HPI: [name] is a 88 y.o. female with medical history significant of HLD, HTN, CAD s/p CABG, chronic HFpEF, DM2, PAD, PUD, RA. Presenting with nausea/vomiting. She has had 2 weeks of symptoms. She reports that she has had a couple episodes of vomiting that is non-bloody. She says she took some nausea medicine but it wasn't helpful. She says she went to her PCP and was told she had some abnormal labs. An ultrasound was scheduled, but she says she did not complete it. Yesterday she had some nausea and felt she may pass out. Her family became concerned and brought her to the ED for evaluation. She reports intermittent headache, but otherwise denies any aggravating or alleviating factors.   Review of Systems: As mentioned in the history of present illness. All other systems reviewed and are negative. Past Medical History:  Diagnosis Date   Allergy to IVP dye    Angina, class I ([place]) 04/22/2015   Atherosclerotic heart disease native coronary artery w/angina pectoris ([place]) 1992   a. 1992 s/p PTCA of LAD;  b. 1998 CABG x 3; c. 07/2001 Cath: Sev LM/LAD/LCX dzs, 3/3 patent grafts; d. 11/2013 Cath: stable graft anatomy; e. 10/2016 Cath: native 3VD, VG->OM nl, LIMA->LAD nl, VG->Diag 55.   Atherosclerotic heart disease of native coronary artery with angina pectoris ([place]) 01/14/2010   Qualifier: Diagnosis of  By: Deatra Ina MD, Sandy Salaam    Atrial septal aneurysm    Barrett's esophagus 03/04/2010   Qualifier: Diagnosis of  By: Shane Crutch, Amy S    Bilateral leg edema 04/22/2015   Bradycardia, mild briefly to the 40s, asymptomatic 05/16/2013   Carotid arterial disease ([place])    a. 05/2014 Carotid U/S: No signif bilat ICA stenosis, >60% L  ECA.   Chronic anemia    Chronic diastolic CHF (congestive heart failure) ([place])    a. 10/2016 Echo: Ef 55-60%, no rwma, Gr1 DD, triv AI, PASP 68mHg, atrial septal aneurysm.   Chronic diastolic CHF (congestive heart failure), NYHA class 2 ([place] 08/11/2017   Claudication ([place] 05/16/2013   Peripheral arterial occlusive disease with lifestyle limiting claudication    Degenerative joint disease 05/30/2018   DIVERTICULOSIS OF COLON 01/14/2010   Qualifier: Diagnosis of  By: KDeatra InaMD, RSandy Salaam   DM (diabetes mellitus), type 2 with peripheral vascular complications ([place]    On Oral medications.   DOE (dyspnea on exertion) 04/22/2015   Essential hypertension    FECAL INCONTINENCE 03/04/2010   Qualifier: Diagnosis of  By: ETrellis PaganiniPA-c, Amy S    GERD (gastroesophageal reflux disease)    Hyperlipidemia with target LDL less than 70    Inflammatory arthritis 10/13/2017   Neuropathy    Non-insulin dependent type 2 diabetes mellitus ([place] 10/24/2015   NSTEMI (non-ST elevated myocardial infarction) ([place] 11/01/2016   PAD (peripheral artery disease) ([place] 05/2013; 09/2013   a. severe calcified POP-1 & TP Trunk Dz bilat;  b . 05/2013 Diamondback Rot Athrectomy (R POP) --> PTA R Pop, DES to R Peroneal;  c. 09/2013 PTA/Stenting of L Pop ([name]- retrograde access);  d. 04/2014 ABI's: R/L: 1.1/1.1.   Preoperative cardiovascular examination 04/15/2020   PUD (peptic ulcer disease)    Rheumatoid arthritis ([place]  S/P CABG x 3 1998   LIMA-LAD, SVG-OM, SVG-D1   Severe claudication ([place]) 05/2013   Referred for Peripheral Angio (Dr. Gwenlyn Found --> Dr. Brunetta Jeans in [place])   [place] 01/14/2010   Qualifier: Diagnosis of  By: Harlon Ditty CMA ([place]), [name]     Status post total replacement of right hip 10/12/2018   Vertigo 01/02/2014   Past Surgical History:  Procedure Laterality Date   ATHERECTOMY Right 05/15/2013   Procedure: ATHERECTOMY;  Surgeon: Lorretta Harp, MD;  Location: [facility] CATH LAB;   Service: Cardiovascular;  Laterality: Right;  popliteal   BACK SURGERY     CARDIAC CATHETERIZATION  07/17/01   2 v CAD with LM, circ, LAD, obtuse diag, mod stenosis of diag vein graft , mild stenosis of marg vein graft, nl EF, medical treatment   CARDIAC CATHETERIZATION  11/2013   Widely patent LIMA-LAD & SVG-OM with mild progression of proximal stenosis ~40-50% in SVG-D1; Widely patent native RCA with known severe native LCA disease   CARDIAC CATHETERIZATION N/A 11/01/2016   Procedure: Left Heart Cath and Cors/Grafts Angiography;  Surgeon: [name], MD;  Location: [place] CV LAB;  Service: Cardiovascular: Hemodynamics: High LVEDP!.  Cors/Grafts: LM 55%, o-p LAD 100% then after D1 -> LIMA-LAD patent, SVG-Diag ~55%. small RI wiht ~70% ostial. O-p Cx 80% - pCx 70% --> SVG-bifurcating OM patent. -- med Rx   CORONARY ANGIOPLASTY  1992   PCI to LAD   CORONARY ARTERY BYPASS GRAFT  1998   LIMA-LAD, SVG-diagonal (none roughly 50% stenosis), SVG-OM   LEFT HEART CATHETERIZATION WITH CORONARY ANGIOGRAM N/A 11/27/2013   Procedure: LEFT HEART CATHETERIZATION WITH CORONARY ANGIOGRAM;  Surgeon: [name], MD;  Location: [facility] CATH LAB;  Service: Cardiovascular;  Laterality: N/A;   LOW EXTREMITY DOPPLERS/ABI  10/2016   Patent right SFA, popliteal and peroneal tendons. Patent left SFA and popliteal stent. 30-50% stenosis in the right common femoral and popliteal arteries, 50-74% stenosis in left profunda femoris artery. --> 1 year follow-up.  RABI - 1.2, LABI 1.1   LOWER EXTREMITY ANGIOGRAM N/A 02/22/2013   Procedure: LOWER EXTREMITY ANGIOGRAM;  Surgeon: Lorretta Harp, MD;  Location: [facility] CATH LAB;  Service: Cardiovascular;  Laterality: N/A;   LOWER EXTREMITY ANGIOGRAM N/A 07/20/2013   Procedure: LOWER EXTREMITY ANGIOGRAM;  Surgeon: Lorretta Harp, MD;  Location: [facility] CATH LAB;  Service: Cardiovascular;  Laterality: N/A;   NM MYOVIEW LTD  04/03/2013; 5/26'/2016   Lexiscan: a)  Apical perfusion defect  with no ischemia. Consider prior infarct versus breast attenuation.;; b) 5/'16: LOW RISK, Nl EF ~55-65%, No Ischemia /Infarction   NM MYOVIEW LTD  10/'19; 4/'21   a) LOW RISK.  EF 55%.  No ischemia or infarction. b) EF estimated 81%.  Suboptimal images.  May be subtle inferior ischemia-initially read by radiology as intermediate risk, however overread by cardiology to be mild low risk.Marland Kitchen   PERCUTANEOUS STENT INTERVENTION Right 05/15/2013   Procedure: PERCUTANEOUS STENT INTERVENTION;  Surgeon: Lorretta Harp, MD;  Location: [facility] CATH LAB;  Service: Cardiovascular;  Laterality: Right;  tibioperoneal trunk   Peroneal Artery Stent  05/15/13   Diamondback rot. atherectomy of high grade segmental popliteal stenosis then Chocolate balloonPTA; then angiosculpt PTA of the prox peroneal with stent-Xpedition    pv angio  07/20/13   high-grade calcified popliteal disease with one-vessel runoff via a small anterior tibial artery that has proximal disease as well, no intervention   TOTAL HIP ARTHROPLASTY Right 10/12/2018   Procedure: RIGHT  TOTAL HIP ARTHROPLASTY ANTERIOR APPROACH;  Surgeon: Mcarthur Rossetti, MD;  Location: [facility] ORS;  Service: Orthopedics;  Laterality: Right;   TOTAL HIP ARTHROPLASTY Left 11/21/2020   Procedure: LEFT TOTAL HIP ARTHROPLASTY ANTERIOR APPROACH;  Surgeon: Mcarthur Rossetti, MD;  Location: [facility] ORS;  Service: Orthopedics;  Laterality: Left;   TRANSTHORACIC ECHOCARDIOGRAM  11/'17; 10/19   a) EF 55-60%. Gr 1 DD. Normal PAP. Aortic Sclerosis.;; b) Normal LV size and function with vigorous EF 65 to 70%.  GR 1 DD.  Severe LA dilation (suggestive of probably worsening: GR 1 DD).   TRANSTHORACIC ECHOCARDIOGRAM  03/18/2020   EF 70-75%.  Hyperdynamic.  GR 1 DD.  No R WMA.  Mild LA dilation.  Mild aortic valve sclerosis but no stenosis.-No change from prior echo   TUBAL LIGATION     Social History:  reports that she has never smoked. She has never used smokeless tobacco. She reports that she  does not currently use alcohol. She reports that she does not use drugs.  Allergies  Allergen Reactions   Fish Allergy Hives and Shortness Of Breath   Iodinated Contrast Media Shortness Of Breath and Anaphylaxis    Other reaction(s): Unknown   Latex Anaphylaxis, Hives, Shortness Of Breath, Itching and Other (See Comments)    REACTION: wheezing Other reaction(s): Unknown   Shellfish Allergy Anaphylaxis, Hives, Shortness Of Breath and Other (See Comments)    All seafood   Amlodipine Swelling   Evolocumab     Other reaction(s): latex on syringe   Other Itching and Other (See Comments)    Patient reports allergy to perfumed detergents. Causes itching.   Metformin Nausea Only and Nausea And Vomiting    Other reaction(s): Unknown    Family History  Adopted: Yes  Problem Relation Age of Onset   Hypertension Mother    Hyperlipidemia Mother    Hyperlipidemia Father    Hypertension Father     Prior to Admission medications   Medication Sig Start Date End Date Taking? Authorizing Provider  acetaminophen (TYLENOL) 650 MG CR tablet Take 650 mg by mouth every 8 (eight) hours as needed for pain.   Yes [provider]  acetaminophen-codeine (TYLENOL #3) 300-30 MG tablet Take 1-2 tablets by mouth every 8 (eight) hours as needed. 03/20/21  Yes Mcarthur Rossetti, MD  carvedilol (COREG) 6.25 MG tablet TAKE 1 TABLET BY MOUTH TWICE A DAY 09/07/22  Yes Lorretta Harp, MD  cephALEXin (KEFLEX) 500 MG capsule Take 500 mg by mouth every 8 (eight) hours. 10/13/22  Yes [provider]  Cholecalciferol (VITAMIN D3) 10 MCG (400 UNIT) tablet Take 400 Units by mouth daily.   Yes [provider]  clopidogrel (PLAVIX) 75 MG tablet Take 75 mg by mouth daily.   Yes [provider]  Coenzyme Q10 (CO Q 10 PO) Take 1 tablet by mouth every other day.    Yes [provider]  ezetimibe (ZETIA) 10 MG tablet Take 10 mg by mouth at bedtime.   Yes [provider]   folic acid (FOLVITE) 1 MG tablet Take 1 mg by mouth daily. 08/21/18  Yes [provider]  gabapentin (NEURONTIN) 400 MG capsule Take 400 mg by mouth 2 (two) times daily. 09/07/22  Yes [provider]  glimepiride (AMARYL) 2 MG tablet Take 2 mg by mouth daily as needed. 01/07/22  Yes [provider]  hydrochlorothiazide (MICROZIDE) 12.5 MG capsule Take 1 capsule (12.5 mg total) by mouth daily. 02/04/22 10/19/22 Yes Quentin Ore,  Oletta Darter, PA-C  isosorbide mononitrate (IMDUR) 60 MG 24 hr tablet TAKE 1.5 TABLETS (90 MG TOTAL) BY MOUTH DAILY. Marland Kitchen Patient taking differently: Take 60 mg by mouth daily. . 04/12/22  Yes Lorretta Harp, MD  LORazepam (ATIVAN) 0.5 MG tablet Take 0.5 mg by mouth daily as needed. 12/04/21  Yes [provider]  methotrexate (RHEUMATREX) 2.5 MG tablet Take 20 mg by mouth every 'Sunday. (8 tablets) 08/21/18  Yes [provider]  nitroGLYCERIN (NITROSTAT) 0.4 MG SL tablet PLACE 1 TABLET (0.4 MG TOTAL) UNDER THE TONGUE EVERY 5 (FIVE) MINUTES AS NEEDED FOR CHEST PAIN. 02/13/18  Yes Berge, Christopher Ronald, NP  pantoprazole (PROTONIX) 40 MG tablet Take 1 tablet (40 mg total) by mouth 2 (two) times daily before a meal. 03/19/20  Yes Patel, Pranav M, MD  predniSONE (DELTASONE) 5 MG tablet Take 5 mg by mouth daily with breakfast.    Yes [provider]  rosuvastatin (CRESTOR) 40 MG tablet TAKE 1 TABLET BY MOUTH EVERYDAY AT BEDTIME Patient taking differently: Take 40 mg by mouth daily. 01/16/21  Yes Berry, Jonathan J, MD  sulfaSALAzine (AZULFIDINE) 500 MG EC tablet Take 500 mg by mouth 2 (two) times daily.   Yes [provider]  alendronate (FOSAMAX) 70 MG tablet Take 70 mg by mouth once a week. Patient not taking: Reported on 10/19/2022    [provider]    Physical Exam: Vitals:   10/20/22 0915 10/20/22 0930 10/20/22 0945 10/20/22 0955  BP: (!) 191/70 (!) 205/74 (!) 185/54   Pulse: (!) 59 61 (!) 54   Resp: 19 20 19   Temp:     98'$ .3 F (36.8 C)  TempSrc:    Oral  SpO2: 100% 100% 100%   Weight:      Height:       General: 88 y.o. female resting in bed in NAD Eyes: PERRL, normal sclera ENMT: Nares patent w/o discharge, orophaynx clear, dentition normal, ears w/o discharge/lesions/ulcers Neck: Supple, trachea midline Cardiovascular: RRR, +S1, S2, no m/g/r, equal pulses throughout Respiratory: CTABL, no w/r/r, normal WOB GI: BS+, NDNT, no masses noted, no organomegaly noted MSK: No e/c/c Neuro: A&O x 3, no focal deficits Psyc: Appropriate interaction and affect, calm/cooperative  Data Reviewed:  Results for orders placed or performed during the hospital encounter of 10/19/22 (from the past 24 hour(s))  Resp Panel by RT-PCR (Flu A&B, Covid) Anterior Nasal Swab     Status: None   Collection Time: 10/19/22  2:41 PM   Specimen: Anterior Nasal Swab  Result Value Ref Range   SARS Coronavirus 2 by RT PCR NEGATIVE NEGATIVE   Influenza A by PCR NEGATIVE NEGATIVE   Influenza B by PCR NEGATIVE NEGATIVE  POC CBG, ED     Status: Abnormal   Collection Time: 10/19/22  2:45 PM  Result Value Ref Range   Glucose-Capillary 148 (H) 70 - 99 mg/dL   Comment 1 Notify RN    Comment 2 Document in Chart   Lipase, blood     Status: None   Collection Time: 10/19/22  5:08 PM  Result Value Ref Range   Lipase 29 11 - 51 U/L  Comprehensive metabolic panel     Status: Abnormal   Collection Time: 10/19/22  5:08 PM  Result Value Ref Range   Sodium 138 135 - 145 mmol/L   Potassium 4.0 3.5 - 5.1 mmol/L   Chloride 104 98 - 111 mmol/L   CO2 30 22 - 32 mmol/L  Glucose, Bld 180 (H) 70 - 99 mg/dL   BUN 10 8 - 23 mg/dL   Creatinine, Ser 0.76 0.44 - 1.00 mg/dL   Calcium 9.2 8.9 - 10.3 mg/dL   Total Protein 7.2 6.5 - 8.1 g/dL   Albumin 3.9 3.5 - 5.0 g/dL   AST 24 15 - 41 U/L   ALT 18 0 - 44 U/L   Alkaline Phosphatase 39 38 - 126 U/L   Total Bilirubin 0.6 0.3 - 1.2 mg/dL   GFR, Estimated >60 >60 mL/min   Anion gap 4 (L) 5 - 15   CBC     Status: Abnormal   Collection Time: 10/19/22  5:08 PM  Result Value Ref Range   WBC 5.7 4.0 - 10.5 K/uL   RBC 4.14 3.87 - 5.11 MIL/uL   Hemoglobin 11.5 (L) 12.0 - 15.0 g/dL   HCT 38.2 36.0 - 46.0 %   MCV 92.3 80.0 - 100.0 fL   MCH 27.8 26.0 - 34.0 pg   MCHC 30.1 30.0 - 36.0 g/dL   RDW 13.9 11.5 - 15.5 %   Platelets 219 150 - 400 K/uL   nRBC 0.0 0.0 - 0.2 %  Troponin I (High Sensitivity)     Status: None   Collection Time: 10/19/22  5:08 PM  Result Value Ref Range   Troponin I (High Sensitivity) 9 <18 ng/L  Brain natriuretic peptide     Status: Abnormal   Collection Time: 10/19/22  5:08 PM  Result Value Ref Range   B Natriuretic Peptide 100.5 (H) 0.0 - 100.0 pg/mL  Urinalysis, Routine w reflex microscopic Urine, Clean Catch     Status: Abnormal   Collection Time: 10/19/22  5:19 PM  Result Value Ref Range   Color, Urine STRAW (A) YELLOW   APPearance CLEAR CLEAR   Specific Gravity, Urine 1.004 (L) 1.005 - 1.030   pH 7.0 5.0 - 8.0   Glucose, UA NEGATIVE NEGATIVE mg/dL   Hgb urine dipstick NEGATIVE NEGATIVE   Bilirubin Urine NEGATIVE NEGATIVE   Ketones, ur NEGATIVE NEGATIVE mg/dL   Protein, ur NEGATIVE NEGATIVE mg/dL   Nitrite NEGATIVE NEGATIVE   Leukocytes,Ua NEGATIVE NEGATIVE  Troponin I (High Sensitivity)     Status: None   Collection Time: 10/19/22  7:27 PM  Result Value Ref Range   Troponin I (High Sensitivity) 7 <18 ng/L   CXR: Minimal to mild bilateral subsegmental atelectasis.   CTH: No CT evidence for acute intracranial abnormality. Atrophy.   CT ab/pelvis: No acute abnormality in the abdomen or pelvis. Colonic diverticulosis without diverticulitis. Aortic Atherosclerosis (ICD10-I70.0). Marked degenerative changes in the spine and at the pubic symphysis.  Assessment and Plan: Hypertensive urgency     - placed in obs, progressive     - BP are improving     - resume home regimen, add scheduled hydralazine  N/V     - anti-emetics, PPI     - CT  ab/pelvis is negative     - UA is negative, no signs of infection     - CLD; advance as tolerated  Chronic HFpEF     - continue home regimen when confirmed  HLD     - continue home regimen when confirmed  RA     - continue home regimen when confirmed  Anxiety     - continue home regimen when confirmed  CAD PAD     - continue home regimen when confirmed  DM2     - A1c, SSI, glucose  checks, CLD  Advance Care Planning:   Code Status: FULL  Consults: None  Family Communication: None at bedside.  Severity of Illness: The appropriate patient status for this patient is OBSERVATION. Observation status is judged to be reasonable and necessary in order to provide the required intensity of service to ensure the patient's safety. The patient's presenting symptoms, physical exam findings, and initial radiographic and laboratory data in the context of their medical condition is felt to place them at decreased risk for further clinical deterioration. Furthermore, it is anticipated that the patient will be medically stable for discharge from the hospital within 2 midnights of admission.   Author: Jonnie Finner, DO 10/20/2022 10:47 AM  For on call review [email or web address].

## 2022-10-20 NOTE — Plan of Care (Signed)
  Problem: Education: Goal: Knowledge of General Education information will improve Description: Including pain rating scale, medication(s)/side effects and non-pharmacologic comfort measures Outcome: Progressing   Problem: Safety: Goal: Ability to remain free from injury will improve Outcome: Progressing   

## 2022-10-21 DIAGNOSIS — I160 Hypertensive urgency: Secondary | ICD-10-CM | POA: Diagnosis not present

## 2022-10-21 LAB — CBC
HCT: 36.5 % (ref 36.0–46.0)
Hemoglobin: 11.2 g/dL — ABNORMAL LOW (ref 12.0–15.0)
MCH: 27.7 pg (ref 26.0–34.0)
MCHC: 30.7 g/dL (ref 30.0–36.0)
MCV: 90.1 fL (ref 80.0–100.0)
Platelets: 208 10*3/uL (ref 150–400)
RBC: 4.05 MIL/uL (ref 3.87–5.11)
RDW: 13.9 % (ref 11.5–15.5)
WBC: 5.8 10*3/uL (ref 4.0–10.5)
nRBC: 0 % (ref 0.0–0.2)

## 2022-10-21 LAB — COMPREHENSIVE METABOLIC PANEL
ALT: 16 U/L (ref 0–44)
AST: 25 U/L (ref 15–41)
Albumin: 3.1 g/dL — ABNORMAL LOW (ref 3.5–5.0)
Alkaline Phosphatase: 34 U/L — ABNORMAL LOW (ref 38–126)
Anion gap: 8 (ref 5–15)
BUN: 9 mg/dL (ref 8–23)
CO2: 29 mmol/L (ref 22–32)
Calcium: 9 mg/dL (ref 8.9–10.3)
Chloride: 101 mmol/L (ref 98–111)
Creatinine, Ser: 0.94 mg/dL (ref 0.44–1.00)
GFR, Estimated: 59 mL/min — ABNORMAL LOW (ref >60)
Glucose, Bld: 175 mg/dL — ABNORMAL HIGH (ref 70–99)
Potassium: 4.1 mmol/L (ref 3.5–5.1)
Sodium: 138 mmol/L (ref 135–145)
Total Bilirubin: 0.8 mg/dL (ref 0.3–1.2)
Total Protein: 6.3 g/dL — ABNORMAL LOW (ref 6.5–8.1)

## 2022-10-21 LAB — GLUCOSE, CAPILLARY
Glucose-Capillary: 165 mg/dL — ABNORMAL HIGH (ref 70–99)
Glucose-Capillary: 188 mg/dL — ABNORMAL HIGH (ref 70–99)
Glucose-Capillary: 205 mg/dL — ABNORMAL HIGH (ref 70–99)
Glucose-Capillary: 249 mg/dL — ABNORMAL HIGH (ref 70–99)
Glucose-Capillary: 99 mg/dL (ref 70–99)

## 2022-10-21 NOTE — Progress Notes (Signed)
PROGRESS NOTE    [name]  YQM:[id number] DOB: 1933-06-11 DOA: 10/19/2022 PCP: Janie Morning, DO   Brief Narrative:  This 88 years old female with PMH significant for hyperlipidemia, hypertension, CAD s/p CABG, chronic HFpEF, type 2 diabetes, peripheral artery disease, peptic ulcer disease, rheumatoid arthritis presented in ED with complaints of nausea and vomiting.  Patient reports having the symptoms for 2 weeks.  She has been using OTC antinausea medications without any relief.  She went to see her PCP and was told that she has abnormal labs.  An ultrasound was scheduled but she could not complete it.  Her symptoms has gotten worse so family brought her in the ED.  She was found to be hypertensive in the ED . Blood pressure found was 250/125.  CT head was done,  negative for intracranial abnormality.  Patient was admitted for further evaluation.  Assessment & Plan:   Principal Problem:   Hypertensive urgency Active Problems:   Dyslipidemia, goal LDL below 70   Essential hypertension   CAD (coronary artery disease)   PAD,severe calcified pop and tibial peroneal trunk disease bilateraly   Diabetes mellitus (HCC)   Chronic diastolic CHF (congestive heart failure), NYHA class 2 (HCC)   Anxiety   Rheumatoid arthritis (HCC)   Nausea and vomiting  Hypertensive urgency: Presented in the ED with persistent nausea and vomiting. She was found to be hypertensive with BP 250/125 on arrival. CT head: Negative for any intracranial abnormality. Patient was started on home blood pressure medications(Coreg, hydralazine) Started on hydralazine 10 mg IV every 6 as needed. Blood pressure has improved.  Chronic nausea and vomiting: Continue Zofran as needed, continue pantoprazole 40 mg IV twice daily CT abdomen and pelvis negative for any intra-abdominal abnormality. UA is negative no signs of infection. Started on clear liquid diet.  Chronic HFpEF: She does not appear volume  overloaded. She is not on any diuretics. Continue Coreg, Imdur, hydralazine.  Hyperlipidemia: Continue Crestor 40 mg daily, Zetia 10 mg daily  Rheumatoid arthritis: Continue methotrexate , sulfasalazine and prednisone.  Anxiety disorder: Continue lorazepam  CAD/PAD: Continue clopidogrel, Crestor.  Type 2 diabetes: Regular insulin sliding scale. Hold p.o. diabetic medications  DVT prophylaxis: Lovenox Code Status: Full code Family Communication: No family at bedside. Disposition Plan:  Status is: Observation The patient remains OBS appropriate and will d/c before 2 midnights.   Admitted for hypertensive urgency , chronic nausea and vomiting.  Blood pressure is improved. PT and OT pending.  Consultants:  None  Procedures: CT head, CT abdomen and pelvis. Antimicrobials:  Anti-infectives (From admission, onward)    None       Subjective: Patient was seen and examined at bedside.  Overnight events noted.   Patient reports doing better but she still feels very weak and tired.  Blood pressure has improved.  Objective: Vitals:   10/20/22 2134 10/21/22 0101 10/21/22 0454 10/21/22 0825  BP: (!) 151/63 (!) 155/62 (!) 157/59 123/84  Pulse: (!) 56 (!) 57 62 61  Resp: 18  18   Temp: 98.3 F (36.8 C) 98.8 F (37.1 C) 97.8 F (36.6 C)   TempSrc: Oral Oral Oral   SpO2: 97% 99% 94%   Weight:      Height:        Intake/Output Summary (Last 24 hours) at 10/21/2022 1346 Last data filed at 10/21/2022 1000 Gross per 24 hour  Intake 1080 ml  Output 1000 ml  Net 80 ml   Filed Weights   10/19/22  1322 10/20/22 1712  Weight: 69.9 kg 68.6 kg    Examination:  General exam: Appears comfortable, not in any acute distress.  Deconditioned Respiratory system: CTA bilaterally, respiratory effort normal, RR 15 Cardiovascular system: S1 & S2 heard, regular rate and rhythm, no murmur. Gastrointestinal system: Abdomen is soft, non tender, non distended, BS+ Central nervous  system: Alert and oriented x 3. No focal neurological deficits. Extremities: No edema, no cyanosis, no clubbing Skin: No rashes, lesions or ulcers Psychiatry: Judgement and insight appear normal. Mood & affect appropriate.     Data Reviewed: I have personally reviewed following labs and imaging studies  CBC: Recent Labs  Lab 10/19/22 1708 10/20/22 1300 10/21/22 0420  WBC 5.7 4.3 5.8  NEUTROABS  --  2.5  --   HGB 11.5* 11.8* 11.2*  HCT 38.2 38.5 36.5  MCV 92.3 90.8 90.1  PLT 219 223 408   Basic Metabolic Panel: Recent Labs  Lab 10/19/22 1708 10/20/22 1300 10/21/22 0420  NA 138 140 138  K 4.0 3.8 4.1  CL 104 104 101  CO2 '30 27 29  '$ GLUCOSE 180* 145* 175*  BUN '10 9 9  '$ CREATININE 0.76 0.80 0.94  CALCIUM 9.2 8.9 9.0   GFR: Estimated Creatinine Clearance: 39.9 mL/min (by C-G formula based on SCr of 0.94 mg/dL). Liver Function Tests: Recent Labs  Lab 10/19/22 1708 10/20/22 1300 10/21/22 0420  AST '24 25 25  '$ ALT '18 17 16  '$ ALKPHOS 39 35* 34*  BILITOT 0.6 0.9 0.8  PROT 7.2 6.6 6.3*  ALBUMIN 3.9 3.3* 3.1*   Recent Labs  Lab 10/19/22 1708  LIPASE 29   No results for input(s): "AMMONIA" in the last 168 hours. Coagulation Profile: No results for input(s): "INR", "PROTIME" in the last 168 hours. Cardiac Enzymes: No results for input(s): "CKTOTAL", "CKMB", "CKMBINDEX", "TROPONINI" in the last 168 hours. BNP (last 3 results) No results for input(s): "PROBNP" in the last 8760 hours. HbA1C: Recent Labs    10/20/22 1300  HGBA1C 8.4*   CBG: Recent Labs  Lab 10/20/22 2226 10/20/22 2312 10/21/22 0032 10/21/22 0751 10/21/22 1201  GLUCAP 39* 183* 249* 99 205*   Lipid Profile: No results for input(s): "CHOL", "HDL", "LDLCALC", "TRIG", "CHOLHDL", "LDLDIRECT" in the last 72 hours. Thyroid Function Tests: No results for input(s): "TSH", "T4TOTAL", "FREET4", "T3FREE", "THYROIDAB" in the last 72 hours. Anemia Panel: No results for input(s): "VITAMINB12", "FOLATE",  "FERRITIN", "TIBC", "IRON", "RETICCTPCT" in the last 72 hours. Sepsis Labs: No results for input(s): "PROCALCITON", "LATICACIDVEN" in the last 168 hours.  Recent Results (from the past 240 hour(s))  Resp Panel by RT-PCR (Flu A&B, Covid) Anterior Nasal Swab     Status: None   Collection Time: 10/19/22  2:41 PM   Specimen: Anterior Nasal Swab  Result Value Ref Range Status   SARS Coronavirus 2 by RT PCR NEGATIVE NEGATIVE Final    Comment: (NOTE) SARS-CoV-2 target nucleic acids are NOT DETECTED.  The SARS-CoV-2 RNA is generally detectable in upper respiratory specimens during the acute phase of infection. The lowest concentration of SARS-CoV-2 viral copies this assay can detect is 138 copies/mL. A negative result does not preclude SARS-Cov-2 infection and should not be used as the sole basis for treatment or other patient management decisions. A negative result may occur with  improper specimen collection/handling, submission of specimen other than nasopharyngeal swab, presence of viral mutation(s) within the areas targeted by this assay, and inadequate number of viral copies(<138 copies/mL). A negative result must be combined with  clinical observations, patient history, and epidemiological information. The expected result is Negative.  Fact Sheet for Patients:  EntrepreneurPulse.com.au  Fact Sheet for Healthcare Providers:  IncredibleEmployment.be  This test is no t yet approved or cleared by the [place] FDA and  has been authorized for detection and/or diagnosis of SARS-CoV-2 by FDA under an Emergency Use Authorization (EUA). This EUA will remain  in effect (meaning this test can be used) for the duration of the COVID-19 declaration under Section 564(b)(1) of the Act, 21 U.S.C.section 360bbb-3(b)(1), unless the authorization is terminated  or revoked sooner.       Influenza A by PCR NEGATIVE NEGATIVE Final   Influenza B by PCR  NEGATIVE NEGATIVE Final    Comment: (NOTE) The Xpert Xpress SARS-CoV-2/FLU/RSV plus assay is intended as an aid in the diagnosis of influenza from Nasopharyngeal swab specimens and should not be used as a sole basis for treatment. Nasal washings and aspirates are unacceptable for Xpert Xpress SARS-CoV-2/FLU/RSV testing.  Fact Sheet for Patients: EntrepreneurPulse.com.au  Fact Sheet for Healthcare Providers: IncredibleEmployment.be  This test is not yet approved or cleared by the [place] FDA and has been authorized for detection and/or diagnosis of SARS-CoV-2 by FDA under an Emergency Use Authorization (EUA). This EUA will remain in effect (meaning this test can be used) for the duration of the COVID-19 declaration under Section 564(b)(1) of the Act, 21 U.S.C. section 360bbb-3(b)(1), unless the authorization is terminated or revoked.  Performed at [facility], [place] [address] [place], [place] [address]     Radiology Studies: CT ABDOMEN PELVIS WO CONTRAST  Result Date: 10/20/2022 CLINICAL DATA:  Nausea and vomiting EXAM: CT ABDOMEN AND PELVIS WITHOUT CONTRAST TECHNIQUE: Multidetector CT imaging of the abdomen and pelvis was performed following the standard protocol without IV contrast. RADIATION DOSE REDUCTION: This exam was performed according to the departmental dose-optimization program which includes automated exposure control, adjustment of the mA and/or kV according to patient size and/or use of iterative reconstruction technique. COMPARISON:  CT abdomen and pelvis 11/17/2019 FINDINGS: Lower chest: Sternotomy and CABG. Coronary stenting and advanced calcification. Normal heart size. No pericardial effusion. Scarring/atelectasis in the lower lobes. Lower lobe bronchial wall thickening. Hepatobiliary: Unremarkable noncontrast appearance of the liver. Normal gallbladder. No biliary dilation. Pancreas: Unremarkable. No  pancreatic ductal dilatation or surrounding inflammatory changes. Spleen: No splenic injury or perisplenic hematoma. Adrenals/Urinary Tract: Adrenal glands are unremarkable. Bilateral cortical renal scarring. No hydronephrosis or obstructing calculi. Bladder is obscured by streak artifact from bilateral THAs. Stomach/Bowel: Stomach is unremarkable. Normal caliber large and small bowel. Colonic diverticulosis without diverticulitis. Vascular/Lymphatic: Aortic atherosclerosis. No enlarged abdominal or pelvic lymph nodes. Reproductive: Obscured by streak artifact. Other: No free intraperitoneal fluid or air. Musculoskeletal: No acute osseous abnormality. Grade 1-2 anterolisthesis of L4 on L5. Advanced thoracolumbar spondylosis. Bilateral THAs. Advanced degenerative changes at the pubic symphysis. IMPRESSION: No acute abnormality in the abdomen or pelvis. Colonic diverticulosis without diverticulitis. Aortic Atherosclerosis (ICD10-I70.0). Marked degenerative changes in the spine and at the pubic symphysis. Electronically Signed   By: Placido Sou M.D.   On: 10/20/2022 00:02   CT Head Wo Contrast  Result Date: 10/19/2022 CLINICAL DATA:  Headache elevated blood pressure EXAM: CT HEAD WITHOUT CONTRAST TECHNIQUE: Contiguous axial images were obtained from the base of the skull through the vertex without intravenous contrast. RADIATION DOSE REDUCTION: This exam was performed according to the departmental dose-optimization program which includes automated exposure control, adjustment of the mA and/or kV according to patient  size and/or use of iterative reconstruction technique. COMPARISON:  CT brain 01/07/2022 FINDINGS: Brain: No acute territorial infarction, hemorrhage, or intracranial mass. Stable ventricle size. Mild atrophy. Vascular: No hyperdense vessels. Vertebral and carotid vascular calcification. Skull: Normal. Negative for fracture or focal lesion. Sinuses/Orbits: No acute finding. Other: None IMPRESSION: No  CT evidence for acute intracranial abnormality. Atrophy. Electronically Signed   By: Donavan Foil M.D.   On: 10/19/2022 20:29   DG Chest 2 View  Result Date: 10/19/2022 CLINICAL DATA:  Shortness of breath. EXAM: CHEST - 2 VIEW COMPARISON:  January 07, 2022. FINDINGS: Stable cardiomediastinal silhouette. Status post coronary bypass graft. Minimal left lingular subsegmental atelectasis is noted. Mild right basilar subsegmental atelectasis is noted. Bony thorax is unremarkable. IMPRESSION: Minimal to mild bilateral subsegmental atelectasis. Electronically Signed   By: Marijo Conception M.D.   On: 10/19/2022 18:10    Scheduled Meds:  carvedilol  6.25 mg Oral BID WC   clopidogrel  75 mg Oral Daily   ezetimibe  10 mg Oral QHS   folic acid  1 mg Oral Daily   gabapentin  400 mg Oral BID   hydrALAZINE  25 mg Oral Q8H   insulin aspart  0-15 Units Subcutaneous TID WC   insulin aspart  0-5 Units Subcutaneous QHS   [START ON 10/24/2022] methotrexate  20 mg Oral Q Sun   pantoprazole  40 mg Oral BID AC   predniSONE  5 mg Oral Q breakfast   rosuvastatin  40 mg Oral Daily   sulfaSALAzine  500 mg Oral BID   Continuous Infusions:   LOS: 0 days    Time spent: [place], MD [facility]   If 7PM-7AM, please contact night-coverage

## 2022-10-21 NOTE — Progress Notes (Signed)
  Transition of Care [facility]) Screening Note   Patient Details  Name: [name] Date of Birth: 1933/04/12   Transition of [facility]) CM/SW Contact:    Dessa Phi, RN Phone Number: 10/21/2022, 12:25 PM    Transition of Care Department [facility]) has reviewed patient and no TOC needs have been identified at this time. We will continue to monitor patient advancement through interdisciplinary progression rounds. If new patient transition needs arise, please place a TOC consult.

## 2022-10-22 DIAGNOSIS — I160 Hypertensive urgency: Secondary | ICD-10-CM | POA: Diagnosis not present

## 2022-10-22 LAB — GLUCOSE, CAPILLARY
Glucose-Capillary: 150 mg/dL — ABNORMAL HIGH (ref 70–99)
Glucose-Capillary: 154 mg/dL — ABNORMAL HIGH (ref 70–99)
Glucose-Capillary: 258 mg/dL — ABNORMAL HIGH (ref 70–99)
Glucose-Capillary: 188 mg/dL — ABNORMAL HIGH (ref 70–99)

## 2022-10-22 MED ORDER — HYDRALAZINE HCL 50 MG PO TABS
50.0000 mg | ORAL_TABLET | Freq: Three times a day (TID) | ORAL | Status: DC
  Administered 2022-10-22 – 2022-10-23 (×3): 50 mg via ORAL
  Filled 2022-10-22 (×3): qty 1

## 2022-10-22 MED ORDER — HYDRALAZINE HCL 50 MG PO TABS: 50.0000 mg | ORAL_TABLET | Freq: Three times a day (TID) | ORAL | Status: DC

## 2022-10-22 NOTE — Progress Notes (Signed)
   10/22/22 1316 10/22/22 1320  Vitals  Temp 98.6 F (37 C)  --   Temp Source Oral  --   BP (!) 194/100 (!) 198/72  MAP (mmHg) 120  --   BP Location Left Arm Right Arm  BP Method Automatic Manual  Patient Position (if appropriate) Sitting Sitting  Pulse Rate 63  --   Pulse Rate Source Dinamap  --   ECG Heart Rate 62 66  Resp 18 18  MEWS COLOR  MEWS Score Color Green Green  MEWS Score  MEWS Temp 0 0  MEWS Systolic 0 0  MEWS Pulse 0 0  MEWS RR 0 0  MEWS LOC 0 0  MEWS Score 0 0   Administered 1400 scheduled hydralazine po. Will follow up

## 2022-10-22 NOTE — Progress Notes (Signed)
   10/22/22 1605  Vitals  BP (!) 208/78  BP Location Left Arm  BP Method Manual  Patient Position (if appropriate) Sitting  ECG Heart Rate 81  Resp (!) 27  MEWS COLOR  MEWS Score Color Red  MEWS Score  MEWS Temp 0  MEWS Systolic 2  MEWS Pulse 0  MEWS RR 2  MEWS LOC 0  MEWS Score 4   Administering PRN Hydralazine IV, & Coreg. Dr. Dwyane Dee notified

## 2022-10-22 NOTE — Progress Notes (Signed)
Physical Therapy Treatment Patient Details Name: [name] MRN: [id number] DOB: 01-15-1933 Today's Date: 10/22/2022   History of Present Illness This 88 years old female with PMH significant for hyperlipidemia, hypertension, CAD s/p CABG, chronic HFpEF, type 2 diabetes, peripheral artery disease, peptic ulcer disease, rheumatoid arthritis presented in ED with complaints of nausea and vomiting. [place] admitted for hypertensive urgency.    PT Comments    Second visit attempted to progress mobility and pt able to complete transfers and ambulate to bathroom with min guard/supervision and occasional HHA to navigate room. Vitals assessed with pt sitting EOB after pt had ambulated short bout in room. BP very high on Rt and Lt UE. RN at bedside to medicate and pt assisted back to recliner at [place]. MD alerted to vitals. Will continue to progress in acute setting as able. Anticipate pt will progress well with mobility, recommend HHPT follow up.    Recommendations for follow up therapy are one component of a multi-disciplinary discharge planning process, led by the attending physician.  Recommendations may be updated based on patient status, additional functional criteria and insurance authorization.  Follow Up Recommendations  Home health PT     Assistance Recommended at Discharge Intermittent Supervision/Assistance  Patient can return home with the following A little help with walking and/or transfers;A little help with bathing/dressing/bathroom;Assistance with cooking/housework;Direct supervision/assist for medications management;Assist for transportation;Help with stairs or ramp for entrance   Equipment Recommendations       Recommendations for Other Services       Precautions / Restrictions Precautions Precautions: Fall Precaution Comments: watch BP Restrictions Weight Bearing Restrictions: No     Mobility  Bed Mobility               General bed mobility comments: OOB in  recliner    Transfers Overall transfer level: Needs assistance Equipment used: Rolling walker (2 wheels) Transfers: Sit to/from Stand Sit to Stand: Supervision, Min guard   Step pivot transfers: Min guard       General transfer comment: supervision for stand from recliner and toilet. pt overall steady. MIn gaurd for stand from soft/low EOB.    Ambulation/Gait Ambulation/Gait assistance: Min guard, Min assist Gait Distance (Feet): 20 Feet Assistive device: None, 1 person hand held assist Gait Pattern/deviations: Step-through pattern, Decreased stride length Gait velocity: decr     General Gait Details: overal steady gait, no LOB, steps slightly shuffled/low. pt reaching for support while ambulating around bed. HHA provided to steady intermittently.   Stairs             Wheelchair Mobility    Modified Rankin (Stroke Patients Only)       Balance Overall balance assessment: Mild deficits observed, not formally tested                                          Cognition Arousal/Alertness: Awake/alert Behavior During Therapy: WFL for tasks assessed/performed Overall Cognitive Status: Within Functional Limits for tasks assessed                                          Exercises      General Comments        Pertinent Vitals/Pain Pain Assessment Pain Assessment: Faces Pain Score: 5  Faces Pain Scale: Hurts little  more Pain Location: heavy head/headache Pain Descriptors / Indicators: Headache Pain Intervention(s): Limited activity within patient's tolerance, Monitored during session, Repositioned (RN at bedside)    Home Living                          Prior Function            PT Goals (current goals can now be found in the care plan section) Acute Rehab PT Goals Patient Stated Goal: get home PT Goal Formulation: With patient Time For Goal Achievement: 11/05/22 Potential to Achieve Goals: Good Progress  towards PT goals: Progressing toward goals    Frequency    Min 3X/week      PT Plan      Co-evaluation              AM-PAC PT "6 Clicks" Mobility   Outcome Measure  Help needed turning from your back to your side while in a flat bed without using bedrails?: A Little Help needed moving from lying on your back to sitting on the side of a flat bed without using bedrails?: A Little Help needed moving to and from a bed to a chair (including a wheelchair)?: A Little Help needed standing up from a chair using your arms (e.g., wheelchair or bedside chair)?: A Little Help needed to walk in hospital room?: A Little Help needed climbing 3-5 steps with a railing? : A Little 6 Click Score: 18    End of Session Equipment Utilized During Treatment: Gait belt Activity Tolerance: Patient tolerated treatment well Patient left: in chair;with call bell/phone within reach;with chair alarm set Nurse Communication: Mobility status PT Visit Diagnosis: Muscle weakness (generalized) (M62.81);Difficulty in walking, not elsewhere classified (R26.2);Other abnormalities of gait and mobility (R26.89)     Time: 3818-2993 PT Time Calculation (min) (ACUTE ONLY): 12 min  Charges:  $Gait Training: 8-22 mins                     Verner Mould, DPT Acute Rehabilitation Services Office [phone]  10/22/22 1:37 PM

## 2022-10-22 NOTE — Evaluation (Signed)
Occupational Therapy Evaluation Patient Details Name: [name] MRN: [id number] DOB: Oct 13, 1933 Today's Date: 10/22/2022   History of Present Illness This 88 years old female with PMH significant for hyperlipidemia, hypertension, CAD s/p CABG, chronic HFpEF, type 2 diabetes, peripheral artery disease, peptic ulcer disease, rheumatoid arthritis presented in ED with complaints of nausea and vomiting. [place] admitted for hypertensive urgency.   Clinical Impression   Mrs. sallyann kinnaird is an 88 year old woman who presents with above medical history. On evaluation she reports a frontal headache and her head feeling heavy with a report of mild dizziness standing at sink. Patient able to don sock with difficulty, stand at sink for grooming and ambulation in room without a device. She did require hand holds on wall and furniture. Her BP was monitored and found to be high - which could be contributing to headache. From an OT standpoint she has no acute care needs. Therapist educated patient on different methods for donning socks to improve ease of task and potential need for shower chair. Recommend HH OT at discharge. Patient does live by herself so should not discharge without assistance unless she can ambulate safely a significant distance.      Recommendations for follow up therapy are one component of a multi-disciplinary discharge planning process, led by the attending physician.  Recommendations may be updated based on patient status, additional functional criteria and insurance authorization.   Follow Up Recommendations  Home health OT    Assistance Recommended at Discharge PRN  Patient can return home with the following      Functional Status Assessment  Patient has had a recent decline in their functional status and demonstrates the ability to make significant improvements in function in a reasonable and predictable amount of time.  Equipment Recommendations  None recommended by OT (Sock  aide, shower chair)    Recommendations for Other Services       Precautions / Restrictions Precautions Precautions: Fall Restrictions Weight Bearing Restrictions: No      Mobility Bed Mobility Overal bed mobility: Modified Independent                  Transfers Overall transfer level: Needs assistance                 General transfer comment: Min guard for ambulation to bathroom, standing at sink and ambulating to recliner. Midly unsteady and patient using hands on walls and furniture.      Balance Overall balance assessment: Mild deficits observed, not formally tested                                         ADL either performed or assessed with clinical judgement   ADL Overall ADL's : At baseline                                       General ADL Comments: Exhibited difficulty with donning socks but struggles with this at home. This demosntrated altered position to assist wtih ease of task and potential use of sock aide. [place] reports having one at home. Therapist also recommended that patient keep the shower chair that she had placed on the curb.     Vision   Vision Assessment?: No apparent visual deficits     Perception  Praxis      Pertinent Vitals/Pain Pain Assessment Pain Assessment: 0-10 Pain Score: 5  Pain Location: headache Pain Descriptors / Indicators: Aching Pain Intervention(s): Monitored during session     Hand Dominance Right   Extremity/Trunk Assessment Upper Extremity Assessment Upper Extremity Assessment: Difficult to assess due to impaired cognition   Lower Extremity Assessment Lower Extremity Assessment: Defer to PT evaluation   Cervical / Trunk Assessment Cervical / Trunk Assessment: Normal   Communication Communication Communication: No difficulties   Cognition Arousal/Alertness: Awake/alert Behavior During Therapy: WFL for tasks assessed/performed Overall Cognitive Status:  Within Functional Limits for tasks assessed                                       General Comments       Exercises     Shoulder Instructions      Home Living Family/patient expects to be discharged to:: Private residence Living Arrangements: Alone Available Help at Discharge: Family;Available PRN/intermittently Type of Home: House Home Access: Ramped entrance     Home Layout: One level     Bathroom Shower/Tub: Teacher, early years/pre: Standard Bathroom Accessibility: Yes   Home Equipment: Cane - single Associate Professor (2 wheels);Shower seat          Prior Functioning/Environment Prior Level of Function : Independent/Modified Independent             Mobility Comments: can drive, can get her own groceries, ADLs Comments: bathes at the sink, sometimes get in the tub, can dress herself        OT Problem List: Cardiopulmonary status limiting activity;Decreased activity tolerance      OT Treatment/Interventions:      OT Goals(Current goals can be found in the care plan section) Acute Rehab OT Goals OT Goal Formulation: All assessment and education complete, DC therapy  OT Frequency:      Co-evaluation              AM-PAC OT "6 Clicks" Daily Activity     Outcome Measure Help from another person eating meals?: None Help from another person taking care of personal grooming?: None Help from another person toileting, which includes using toliet, bedpan, or urinal?: None Help from another person bathing (including washing, rinsing, drying)?: None Help from another person to put on and taking off regular upper body clothing?: None Help from another person to put on and taking off regular lower body clothing?: None 6 Click Score: 24   End of Session Nurse Communication: Mobility status (BP, MD informed of BP as well)  Activity Tolerance: Other (comment) (elevated BP) Patient left: in chair;with call bell/phone within  reach  OT Visit Diagnosis: Muscle weakness (generalized) (M62.81)                Time: 2952-8413 OT Time Calculation (min): 23 min Charges:  OT General Charges $OT Visit: 1 Visit OT Evaluation $OT Eval Low Complexity: 1 Low  Gustavo Lah, OTR/L [place]  Office [phone]   Lenward Chancellor 10/22/2022, 12:31 PM

## 2022-10-22 NOTE — Progress Notes (Signed)
Physical Therapy Evaluation Patient Details Name: [name] MRN: [id number] DOB: 1933/02/12 Today's Date: 10/22/2022  History of Present Illness  This 88 years old female with PMH significant for hyperlipidemia, hypertension, CAD s/p CABG, chronic HFpEF, type 2 diabetes, peripheral artery disease, peptic ulcer disease, rheumatoid arthritis presented in ED with complaints of nausea and vomiting. [place] admitted for hypertensive urgency.    Clinical Impression  [name] is 88 y.o. female admitted with above HPI and diagnosis. Patient is currently limited by functional impairments below (see PT problem list). Patient lives alone and is independent at baseline. She requires min guard/supervision for sit<>stand and bed<>chair transfers with and without RW for support. Mobility limited this session by c/o "heavy headed" feeling and significant hypertension. Patient will benefit from continued skilled PT interventions to address impairments and progress independence with mobility, recommending HHPT. Acute PT will follow and progress as able.        10/22/22 0917  Vital Signs  Patient Position (if appropriate) Orthostatic Vitals  Orthostatic Sitting  BP- Sitting 175/88  Pulse- Sitting 64  Orthostatic Standing at 0 minutes  BP- Standing at 0 minutes 147/90  Pulse- Standing at 0 minutes 61  Orthostatic Standing at 3 minutes  BP- Standing at 3 minutes (!) 149/111      Recommendations for follow up therapy are one component of a multi-disciplinary discharge planning process, led by the attending physician.  Recommendations may be updated based on patient status, additional functional criteria and insurance authorization.  PT Recommendation   Follow Up Recommendations Home health PT Filed 10/22/2022 5956  Assistance recommended at discharge Intermittent Supervision/Assistance Filed 10/22/2022 3875  Patient can return home with the following A little help with walking and/or transfers, A  little help with bathing/dressing/bathroom, Assistance with cooking/housework, Direct supervision/assist for medications management, Assist for transportation, Help with stairs or ramp for entrance [facility] 10/22/2022 0926  Functional Status Assessment Patient has had a recent decline in their functional status and demonstrates the ability to make significant improvements in function in a reasonable and predictable amount of time. Filed 10/22/2022 6433   Precautions / Restrictions Precautions Precautions: Fall Restrictions Weight Bearing Restrictions: No      Mobility  Bed Mobility               General bed mobility comments: OOB with OT at bedside    Transfers Overall transfer level: Needs assistance Equipment used: Rolling walker (2 wheels) Transfers: Sit to/from Stand, Bed to chair/wheelchair/BSC Sit to Stand: Supervision   Step pivot transfers: Min guard       General transfer comment: supervision for power up from EOB and recliner. Guarding for safety with step pivot to move bed>chair. pt reliant on hands for steadying to turn to chair. repeated sit<>stands from recliner with RW for BP assessment.    Ambulation/Gait                  Stairs            Wheelchair Mobility    Modified Rankin (Stroke Patients Only)          Pertinent Vitals/Pain Pain Assessment Pain Assessment: 0-10 Pain Score: 5  Pain Location: heavy head/headache Pain Descriptors / Indicators: Headache Pain Intervention(s): Limited activity within patient's tolerance, Monitored during session, Repositioned     10/22/22 0926  Home Living  Family/patient expects to be discharged to: Private residence  Living Arrangements Alone  Available Help at Discharge Family;Available PRN/intermittently  Type of Home House  Home Access Ramped entrance  Home Layout One level  Bathroom Shower/Tub Tub/shower unit  Corporate treasurer Yes  [place]  - single Associate Professor (2 wheels);Shower seat  Prior Function  Prior Level of Function  Independent/Modified Independent  Mobility Comments can drive, can get her own groceries,  ADLs Comments bathes at the sink, sometimes get in the tub, can dress herself     Extremity/Trunk Assessment   Upper Extremity Assessment Upper Extremity Assessment: Difficult to assess due to impaired cognition    Lower Extremity Assessment Lower Extremity Assessment: Defer to PT evaluation    Cervical / Trunk Assessment Cervical / Trunk Assessment: Normal  Communication      Cognition Arousal/Alertness: Awake/alert Behavior During Therapy: WFL for tasks assessed/performed Overall Cognitive Status: Within Functional Limits for tasks assessed                                            10/22/22 0926  PT - End of Session  Equipment Utilized During Treatment Gait belt  Activity Tolerance Patient tolerated treatment well  Patient left in chair;with call bell/phone within reach;with chair alarm set  Nurse Communication Mobility status  PT Assessment  PT Recommendation/Assessment Patient needs continued PT services  PT Visit Diagnosis Muscle weakness (generalized) (M62.81);Difficulty in walking, not elsewhere classified (R26.2);Other abnormalities of gait and mobility (R26.89)  PT Problem List Decreased strength;Decreased range of motion;Decreased activity tolerance;Decreased balance;Decreased mobility;Decreased knowledge of use of DME;Decreased knowledge of precautions;Obesity  PT Plan  PT Frequency (ACUTE ONLY) Min 3X/week  PT Treatment/Interventions (ACUTE ONLY) DME instruction;Gait training;Stair training;Functional mobility training;Therapeutic activities;Therapeutic exercise;Balance training;Patient/family education  AM-PAC PT "6 Clicks" Mobility Outcome Measure (Version 2)  Help needed turning from your back to your side while in a flat bed without using bedrails?  3  Help needed moving from lying on your back to sitting on the side of a flat bed without using bedrails? 3  Help needed moving to and from a bed to a chair (including a wheelchair)? 3  Help needed standing up from a chair using your arms (e.g., wheelchair or bedside chair)? 3  Help needed to walk in hospital room? 3  Help needed climbing 3-5 steps with a railing?  3  6 Click Score 18  Consider Recommendation of Discharge To: Home with [facility]  Progressive Mobility  What is the highest level of mobility based on the progressive mobility assessment? Level 4 (Walks with assist in room) - Balance while marching in place and cannot step forward and back - Complete  Mobility Referral No  Activity Stood at bedside  PT Recommendation  Follow Up Recommendations Home health PT  Assistance recommended at discharge Intermittent Supervision/Assistance  Patient can return home with the following A little help with walking and/or transfers;A little help with bathing/dressing/bathroom;Assistance with cooking/housework;Direct supervision/assist for medications management;Assist for transportation;Help with stairs or ramp for entrance  Functional Status Assessment Patient has had a recent decline in their functional status and demonstrates the ability to make significant improvements in function in a reasonable and predictable amount of time.  Individuals Consulted  Consulted and Agree with Results and Recommendations Patient  Acute Rehab PT Goals  Patient Stated Goal get home  PT Goal Formulation With patient  Time For Goal Achievement 11/05/22  Potential to Achieve Goals Good  PT Time Calculation  PT Start Time (ACUTE ONLY)  0917  PT Stop Time (ACUTE ONLY) 0926  PT Time Calculation (min) (ACUTE ONLY) 9 min  PT General Charges  $$ ACUTE PT VISIT 1 Visit  PT Evaluation  $PT Eval Low Complexity 1 Low     Gwynneth Albright PT, DPT Acute Rehabilitation Services Office [phone]  10/22/22 1:28 PM

## 2022-10-22 NOTE — Progress Notes (Signed)
PROGRESS NOTE    [name]  CHE:[id number] DOB: March 27, 1933 DOA: 10/19/2022 PCP: Janie Morning, DO   Brief Narrative:  This 88 years old female with PMH significant for hyperlipidemia, hypertension, CAD s/p CABG, chronic HFpEF, type 2 diabetes, peripheral artery disease, peptic ulcer disease, rheumatoid arthritis presented in ED with complaints of nausea and vomiting.  Patient reports having the symptoms for 2 weeks.  She has been using OTC antinausea medications without any relief.  She went to see her PCP and was told that she has abnormal labs.  An ultrasound was scheduled but she could not complete it.  Her symptoms has gotten worse so family brought her in the ED.  She was found to be hypertensive in the ED . Blood pressure found was 250/125.  CT head was done,  negative for intracranial abnormality.  Patient was admitted for further evaluation.  Assessment & Plan:   Principal Problem:   Hypertensive urgency Active Problems:   Dyslipidemia, goal LDL below 70   Essential hypertension   CAD (coronary artery disease)   PAD,severe calcified pop and tibial peroneal trunk disease bilateraly   Diabetes mellitus (HCC)   Chronic diastolic CHF (congestive heart failure), NYHA class 2 (HCC)   Anxiety   Rheumatoid arthritis (HCC)   Nausea and vomiting  Hypertensive urgency: Presented in the ED with persistent nausea and vomiting. She was found to be hypertensive with SBP 250/125 on arrival. CT head: Negative for any intracranial abnormality. Patient was started on home blood pressure medications (Coreg, hydralazine) Started on hydralazine 10 mg IV every 6 as needed. Blood pressure has improved.  Chronic nausea and vomiting: Continue Zofran as needed, continue pantoprazole 40 mg IV twice daily. CT abdomen and pelvis negative for any intra-abdominal abnormality. UA is negative,  No signs of infection. Started on clear liquid diet.Advanced to soft  Chronic HFpEF: She does not appear  volume overloaded. She is not on any diuretics. Continue Coreg, Imdur, hydralazine.  Hyperlipidemia: Continue Crestor 40 mg daily, Zetia 10 mg daily  Rheumatoid arthritis: Continue methotrexate , sulfasalazine and prednisone.  Anxiety disorder: Continue lorazepam  CAD/PAD: Continue clopidogrel, Crestor.  Type 2 diabetes with hypoglycemia Regular insulin sliding scale. Hold p.o. diabetic medications  DVT prophylaxis: Lovenox Code Status: Full code Family Communication: No family at bedside. Disposition Plan:  Status is: Observation The patient remains OBS appropriate and will d/c before 2 midnights.   Admitted for hypertensive urgency , chronic nausea and vomiting.  Blood pressure is improved. PT and OT pending.  Anticipated discharge home 10/23/2022.  Consultants:  None  Procedures: CT head, CT abdomen and pelvis. Antimicrobials:  Anti-infectives (From admission, onward)    None       Subjective: Patient was seen and examined at bedside.  Overnight events noted.   Patient reports not feeling well, states she is still feeling nauseous. Her blood pressure has improved.  Reports feeling very weak and tired.  Objective: Vitals:   10/21/22 1533 10/21/22 2044 10/22/22 0502 10/22/22 0800  BP: (!) 143/73 (!) 131/56 (!) 143/72   Pulse: 72 65 62   Resp:  19  (!) 26  Temp:  98.7 F (37.1 C) 97.7 F (36.5 C)   TempSrc:  Oral Oral   SpO2:  99% 98%   Weight:   72.2 kg   Height:        Intake/Output Summary (Last 24 hours) at 10/22/2022 1315 Last data filed at 10/22/2022 0456 Gross per 24 hour  Intake 840 ml  Output 0 ml  Net 840 ml   Filed Weights   10/19/22 1322 10/20/22 1712 10/22/22 0502  Weight: 69.9 kg 68.6 kg 72.2 kg    Examination:  General exam: Appears comfortable, not in any acute distress.  Deconditioned Respiratory system: CTA bilaterally, respiratory effort normal, RR 15 Cardiovascular system: S1 & S2 heard, regular rate and rhythm, no  murmur. Gastrointestinal system: Abdomen is soft, non tender, non distended, BS+ Central nervous system: Alert and oriented x 3. No focal neurological deficits. Extremities: No edema, no cyanosis, no clubbing Skin: No rashes, lesions or ulcers Psychiatry: Judgement and insight appear normal. Mood & affect appropriate.     Data Reviewed: I have personally reviewed following labs and imaging studies  CBC: Recent Labs  Lab 10/19/22 1708 10/20/22 1300 10/21/22 0420  WBC 5.7 4.3 5.8  NEUTROABS  --  2.5  --   HGB 11.5* 11.8* 11.2*  HCT 38.2 38.5 36.5  MCV 92.3 90.8 90.1  PLT 219 223 932   Basic Metabolic Panel: Recent Labs  Lab 10/19/22 1708 10/20/22 1300 10/21/22 0420  NA 138 140 138  K 4.0 3.8 4.1  CL 104 104 101  CO2 '30 27 29  '$ GLUCOSE 180* 145* 175*  BUN '10 9 9  '$ CREATININE 0.76 0.80 0.94  CALCIUM 9.2 8.9 9.0   GFR: Estimated Creatinine Clearance: 40.9 mL/min (by C-G formula based on SCr of 0.94 mg/dL). Liver Function Tests: Recent Labs  Lab 10/19/22 1708 10/20/22 1300 10/21/22 0420  AST '24 25 25  '$ ALT '18 17 16  '$ ALKPHOS 39 35* 34*  BILITOT 0.6 0.9 0.8  PROT 7.2 6.6 6.3*  ALBUMIN 3.9 3.3* 3.1*   Recent Labs  Lab 10/19/22 1708  LIPASE 29   No results for input(s): "AMMONIA" in the last 168 hours. Coagulation Profile: No results for input(s): "INR", "PROTIME" in the last 168 hours. Cardiac Enzymes: No results for input(s): "CKTOTAL", "CKMB", "CKMBINDEX", "TROPONINI" in the last 168 hours. BNP (last 3 results) No results for input(s): "PROBNP" in the last 8760 hours. HbA1C: Recent Labs    10/20/22 1300  HGBA1C 8.4*   CBG: Recent Labs  Lab 10/21/22 1201 10/21/22 1716 10/21/22 2046 10/22/22 0748 10/22/22 1140  GLUCAP 205* 188* 165* 150* 154*   Lipid Profile: No results for input(s): "CHOL", "HDL", "LDLCALC", "TRIG", "CHOLHDL", "LDLDIRECT" in the last 72 hours. Thyroid Function Tests: No results for input(s): "TSH", "T4TOTAL", "FREET4",  "T3FREE", "THYROIDAB" in the last 72 hours. Anemia Panel: No results for input(s): "VITAMINB12", "FOLATE", "FERRITIN", "TIBC", "IRON", "RETICCTPCT" in the last 72 hours. Sepsis Labs: No results for input(s): "PROCALCITON", "LATICACIDVEN" in the last 168 hours.  Recent Results (from the past 240 hour(s))  Resp Panel by RT-PCR (Flu A&B, Covid) Anterior Nasal Swab     Status: None   Collection Time: 10/19/22  2:41 PM   Specimen: Anterior Nasal Swab  Result Value Ref Range Status   SARS Coronavirus 2 by RT PCR NEGATIVE NEGATIVE Final    Comment: (NOTE) SARS-CoV-2 target nucleic acids are NOT DETECTED.  The SARS-CoV-2 RNA is generally detectable in upper respiratory specimens during the acute phase of infection. The lowest concentration of SARS-CoV-2 viral copies this assay can detect is 138 copies/mL. A negative result does not preclude SARS-Cov-2 infection and should not be used as the sole basis for treatment or other patient management decisions. A negative result may occur with  improper specimen collection/handling, submission of specimen other than nasopharyngeal swab, presence of viral mutation(s) within the areas  targeted by this assay, and inadequate number of viral copies(<138 copies/mL). A negative result must be combined with clinical observations, patient history, and epidemiological information. The expected result is Negative.  Fact Sheet for Patients:  EntrepreneurPulse.com.au  Fact Sheet for Healthcare Providers:  IncredibleEmployment.be  This test is no t yet approved or cleared by the [place] FDA and  has been authorized for detection and/or diagnosis of SARS-CoV-2 by FDA under an Emergency Use Authorization (EUA). This EUA will remain  in effect (meaning this test can be used) for the duration of the COVID-19 declaration under Section 564(b)(1) of the Act, 21 U.S.C.section 360bbb-3(b)(1), unless the authorization is  terminated  or revoked sooner.       Influenza A by PCR NEGATIVE NEGATIVE Final   Influenza B by PCR NEGATIVE NEGATIVE Final    Comment: (NOTE) The Xpert Xpress SARS-CoV-2/FLU/RSV plus assay is intended as an aid in the diagnosis of influenza from Nasopharyngeal swab specimens and should not be used as a sole basis for treatment. Nasal washings and aspirates are unacceptable for Xpert Xpress SARS-CoV-2/FLU/RSV testing.  Fact Sheet for Patients: EntrepreneurPulse.com.au  Fact Sheet for Healthcare Providers: IncredibleEmployment.be  This test is not yet approved or cleared by the [place] FDA and has been authorized for detection and/or diagnosis of SARS-CoV-2 by FDA under an Emergency Use Authorization (EUA). This EUA will remain in effect (meaning this test can be used) for the duration of the COVID-19 declaration under Section 564(b)(1) of the Act, 21 U.S.C. section 360bbb-3(b)(1), unless the authorization is terminated or revoked.  Performed at [facility], [place] [address], [place], [place] [address]     Radiology Studies: No results found.  Scheduled Meds:  carvedilol  6.25 mg Oral BID WC   clopidogrel  75 mg Oral Daily   ezetimibe  10 mg Oral QHS   folic acid  1 mg Oral Daily   gabapentin  400 mg Oral BID   hydrALAZINE  25 mg Oral Q8H   insulin aspart  0-15 Units Subcutaneous TID WC   insulin aspart  0-5 Units Subcutaneous QHS   [START ON 10/24/2022] methotrexate  20 mg Oral Q Sun   pantoprazole  40 mg Oral BID AC   predniSONE  5 mg Oral Q breakfast   rosuvastatin  40 mg Oral Daily   sulfaSALAzine  500 mg Oral BID   Continuous Infusions:   LOS: 0 days    Time spent: 35 MINS    Millissa Deese, MD [facility]   If 7PM-7AM, please contact night-coverage

## 2022-10-23 DIAGNOSIS — I160 Hypertensive urgency: Secondary | ICD-10-CM | POA: Diagnosis not present

## 2022-10-23 LAB — GLUCOSE, CAPILLARY
Glucose-Capillary: 134 mg/dL — ABNORMAL HIGH (ref 70–99)
Glucose-Capillary: 219 mg/dL — ABNORMAL HIGH (ref 70–99)

## 2022-10-23 MED ORDER — HYDRALAZINE HCL 50 MG PO TABS: 50.0000 mg | ORAL_TABLET | Freq: Three times a day (TID) | ORAL | 1 refills | Status: DC

## 2022-10-23 NOTE — TOC Transition Note (Signed)
Transition of Care [facility]) - CM/SW Discharge Note   Patient Details  Name: [name] MRN: [id number] Date of Birth: [birth year]  Transition of Care [facility]) CM/SW Contact:  Vassie Moselle, LCSW Phone Number: 10/23/2022, 11:59 AM   Clinical Narrative:    Met  with pt who shares she lives alone at home and has a RW. Pt agreeable to [facility]. HHPT/Ot arranged with [facility].    Final next level of care: [place] Barriers to Discharge: No Barriers Identified   Patient Goals and CMS Choice Patient states their goals for this hospitalization and ongoing recovery are:: To go home CMS Medicare.gov Compare Post Acute Care list provided to:: Patient Choice offered to / list presented to : Patient  Discharge Placement                       Discharge Plan and Services                DME Arranged: N/A DME Agency: NA       HH Arranged: PT, OT HH Agency: [place] Date [place]: 10/23/22 Time [place]: 1159 Representative spoke with at [place]: Levada Dy  Social Determinants of Health ([place]) Interventions     Readmission Risk Interventions     No data to display

## 2022-10-23 NOTE — Discharge Summary (Addendum)
Physician Discharge Summary  [name]:[id number] DOB: 1933-09-20 DOA: 10/19/2022  PCP: Janie Morning, DO  Admit date: 10/19/2022  Discharge date: 10/23/2022  Admitted From: Home.  Disposition:  Home health services.  Recommendations for Outpatient Follow-up:  Follow up with PCP in 1-2 weeks. Please obtain BMP/CBC in one week Advised to take hydralazine 50 mg 3 times daily in addition to other blood pressure medications. We will request PCP to adjust her blood pressure medications as needed  Home Health: HHPT/OT Equipment/Devices:None  Discharge Condition: Stable CODE STATUS:Full code Diet recommendation: Heart Healthy   Brief [facility] Course: This 88 years old female with PMH significant for hyperlipidemia, hypertension, CAD s/p CABG, chronic HFpEF, type 2 diabetes, peripheral artery disease, peptic ulcer disease, rheumatoid arthritis presented in ED with complaints of nausea and vomiting. Patient reports having these symptoms for 2 weeks. She has been using OTC antinausea medications without any relief.  She went to see her PCP and was told that she has abnormal labs.  An ultrasound was scheduled but she could not complete it.  Her symptoms has gotten worse so family brought her in the ED.  She was found to be hypertensive in the ED . Blood pressure found was 250/125. CT head was done,  negative for intracranial abnormality.  Patient was admitted for further evaluation.  CT abdomen pelvis was negative for any acute abnormality.  Patient was started on supportive care IV hydration.  Blood pressure medications adjusted.  Blood pressure has improved.  Nausea vomiting and abdominal pain has resolved.  PT and OT recommended home health services.  Patient feels better and wants to be discharged.  Patient is being discharged , home health services arranged.   Discharge Diagnoses:  Principal Problem:   Hypertensive urgency Active Problems:   Dyslipidemia, goal LDL below 70    Essential hypertension   CAD (coronary artery disease)   PAD,severe calcified pop and tibial peroneal trunk disease bilateraly   Diabetes mellitus (HCC)   Chronic diastolic CHF (congestive heart failure), NYHA class 2 (HCC)   Anxiety   Rheumatoid arthritis (HCC)   Nausea and vomiting  Hypertensive urgency: Presented in the ED with persistent nausea and vomiting. She was found to be hypertensive with SBP 250/125 on arrival. CT head: Negative for any intracranial abnormality. Patient was started on home blood pressure medications (Coreg, hydralazine) Started on hydralazine 10 mg IV every 6 as needed. Blood pressure has improved.   Chronic nausea and vomiting: Continue Zofran as needed, continue pantoprazole 40 mg IV twice daily. CT abdomen and pelvis negative for any intra-abdominal abnormality. UA is negative,  No signs of infection. Started on clear liquid diet.Advanced to soft   Chronic HFpEF: She does not appear volume overloaded. She is not on any diuretics. Continue Coreg, Imdur, hydralazine.   Hyperlipidemia: Continue Crestor 40 mg daily, Zetia 10 mg daily   Rheumatoid arthritis: Continue methotrexate , sulfasalazine and prednisone.   Anxiety disorder: Continue lorazepam   CAD/PAD: Continue clopidogrel, Crestor.   Type 2 diabetes with hypoglycemia Regular insulin sliding scale. Hold p.o. diabetic medications  Discharge Instructions  Discharge Instructions     Call MD for:  difficulty breathing, headache or visual disturbances   Complete by: As directed    Call MD for:  persistant dizziness or light-headedness   Complete by: As directed    Call MD for:  persistant nausea and vomiting   Complete by: As directed    Diet - low sodium heart healthy  Complete by: As directed    Diet Carb Modified   Complete by: As directed    Discharge instructions   Complete by: As directed    Advised to follow-up with primary care physician in 1 week. Advised to take  hydralazine 50 mg 3 times daily in addition to other blood pressure medications. We will request PCP to adjust her blood pressure medications as needed   Increase activity slowly   Complete by: As directed       Allergies as of 10/23/2022       Reactions   Fish Allergy Hives, Shortness Of Breath   Iodinated Contrast Media Shortness Of Breath, Anaphylaxis   Other reaction(s): Unknown   Latex Anaphylaxis, Hives, Shortness Of Breath, Itching, Other (See Comments)   REACTION: wheezing Other reaction(s): Unknown   Shellfish Allergy Anaphylaxis, Hives, Shortness Of Breath, Other (See Comments)   All seafood   Amlodipine Swelling   Evolocumab    Other reaction(s): latex on syringe   Other Itching, Other (See Comments)   Patient reports allergy to perfumed detergents. Causes itching.   Metformin Nausea Only, Nausea And Vomiting   Other reaction(s): Unknown        Medication List     STOP taking these medications    cephALEXin 500 MG capsule Commonly known as: KEFLEX   hydrochlorothiazide 12.5 MG capsule Commonly known as: MICROZIDE       TAKE these medications    acetaminophen 650 MG CR tablet Commonly known as: TYLENOL Take 650 mg by mouth every 8 (eight) hours as needed for pain.   acetaminophen-codeine 300-30 MG tablet Commonly known as: TYLENOL #3 Take 1-2 tablets by mouth every 8 (eight) hours as needed.   alendronate 70 MG tablet Commonly known as: FOSAMAX Take 70 mg by mouth once a week.   carvedilol 6.25 MG tablet Commonly known as: COREG TAKE 1 TABLET BY MOUTH TWICE A DAY   clopidogrel 75 MG tablet Commonly known as: PLAVIX Take 75 mg by mouth daily.   CO Q 10 PO Take 1 tablet by mouth every other day.   ezetimibe 10 MG tablet Commonly known as: ZETIA Take 10 mg by mouth at bedtime.   folic acid 1 MG tablet Commonly known as: FOLVITE Take 1 mg by mouth daily.   gabapentin 400 MG capsule Commonly known as: NEURONTIN Take 400 mg by mouth 2  (two) times daily.   glimepiride 2 MG tablet Commonly known as: AMARYL Take 2 mg by mouth daily as needed.   hydrALAZINE 50 MG tablet Commonly known as: APRESOLINE Take 1 tablet (50 mg total) by mouth every 8 (eight) hours.   isosorbide mononitrate 60 MG 24 hr tablet Commonly known as: IMDUR TAKE 1.5 TABLETS (90 MG TOTAL) BY MOUTH DAILY. Marland Kitchen What changed: how much to take   LORazepam 0.5 MG tablet Commonly known as: ATIVAN Take 0.5 mg by mouth daily as needed.   methotrexate 2.5 MG tablet Commonly known as: RHEUMATREX Take 20 mg by mouth every Sunday. (8 tablets)   nitroGLYCERIN 0.4 MG SL tablet Commonly known as: NITROSTAT PLACE 1 TABLET (0.4 MG TOTAL) UNDER THE TONGUE EVERY 5 (FIVE) MINUTES AS NEEDED FOR CHEST PAIN.   pantoprazole 40 MG tablet Commonly known as: PROTONIX Take 1 tablet (40 mg total) by mouth 2 (two) times daily before a meal.   predniSONE 5 MG tablet Commonly known as: DELTASONE Take 5 mg by mouth daily with breakfast.   rosuvastatin 40 MG tablet Commonly known as:  CRESTOR TAKE 1 TABLET BY MOUTH EVERYDAY AT BEDTIME What changed: See the new instructions.   sulfaSALAzine 500 MG EC tablet Commonly known as: AZULFIDINE Take 500 mg by mouth 2 (two) times daily.   Vitamin D3 10 MCG (400 UNIT) tablet Take 400 Units by mouth daily.        Follow-up Information     Janie Morning, DO Follow up in 1 week(s).   Specialty: Family Medicine Contact information: [address] [place] [address] [phone]                Allergies  Allergen Reactions   Fish Allergy Hives and Shortness Of Breath   Iodinated Contrast Media Shortness Of Breath and Anaphylaxis    Other reaction(s): Unknown   Latex Anaphylaxis, Hives, Shortness Of Breath, Itching and Other (See Comments)    REACTION: wheezing Other reaction(s): Unknown   Shellfish Allergy Anaphylaxis, Hives, Shortness Of Breath and Other (See Comments)    All seafood    Amlodipine Swelling   Evolocumab     Other reaction(s): latex on syringe   Other Itching and Other (See Comments)    Patient reports allergy to perfumed detergents. Causes itching.   Metformin Nausea Only and Nausea And Vomiting    Other reaction(s): Unknown    Consultations: None   Procedures/Studies: CT ABDOMEN PELVIS WO CONTRAST  Result Date: 10/20/2022 CLINICAL DATA:  Nausea and vomiting EXAM: CT ABDOMEN AND PELVIS WITHOUT CONTRAST TECHNIQUE: Multidetector CT imaging of the abdomen and pelvis was performed following the standard protocol without IV contrast. RADIATION DOSE REDUCTION: This exam was performed according to the departmental dose-optimization program which includes automated exposure control, adjustment of the mA and/or kV according to patient size and/or use of iterative reconstruction technique. COMPARISON:  CT abdomen and pelvis 11/17/2019 FINDINGS: Lower chest: Sternotomy and CABG. Coronary stenting and advanced calcification. Normal heart size. No pericardial effusion. Scarring/atelectasis in the lower lobes. Lower lobe bronchial wall thickening. Hepatobiliary: Unremarkable noncontrast appearance of the liver. Normal gallbladder. No biliary dilation. Pancreas: Unremarkable. No pancreatic ductal dilatation or surrounding inflammatory changes. Spleen: No splenic injury or perisplenic hematoma. Adrenals/Urinary Tract: Adrenal glands are unremarkable. Bilateral cortical renal scarring. No hydronephrosis or obstructing calculi. Bladder is obscured by streak artifact from bilateral THAs. Stomach/Bowel: Stomach is unremarkable. Normal caliber large and small bowel. Colonic diverticulosis without diverticulitis. Vascular/Lymphatic: Aortic atherosclerosis. No enlarged abdominal or pelvic lymph nodes. Reproductive: Obscured by streak artifact. Other: No free intraperitoneal fluid or air. Musculoskeletal: No acute osseous abnormality. Grade 1-2 anterolisthesis of L4 on L5. Advanced  thoracolumbar spondylosis. Bilateral THAs. Advanced degenerative changes at the pubic symphysis. IMPRESSION: No acute abnormality in the abdomen or pelvis. Colonic diverticulosis without diverticulitis. Aortic Atherosclerosis (ICD10-I70.0). Marked degenerative changes in the spine and at the pubic symphysis. Electronically Signed   By: Placido Sou M.D.   On: 10/20/2022 00:02   CT Head Wo Contrast  Result Date: 10/19/2022 CLINICAL DATA:  Headache elevated blood pressure EXAM: CT HEAD WITHOUT CONTRAST TECHNIQUE: Contiguous axial images were obtained from the base of the skull through the vertex without intravenous contrast. RADIATION DOSE REDUCTION: This exam was performed according to the departmental dose-optimization program which includes automated exposure control, adjustment of the mA and/or kV according to patient size and/or use of iterative reconstruction technique. COMPARISON:  CT brain 01/07/2022 FINDINGS: Brain: No acute territorial infarction, hemorrhage, or intracranial mass. Stable ventricle size. Mild atrophy. Vascular: No hyperdense vessels. Vertebral and carotid vascular calcification. Skull: Normal. Negative for fracture  or focal lesion. Sinuses/Orbits: No acute finding. Other: None IMPRESSION: No CT evidence for acute intracranial abnormality. Atrophy. Electronically Signed   By: Donavan Foil M.D.   On: 10/19/2022 20:29   DG Chest 2 View  Result Date: 10/19/2022 CLINICAL DATA:  Shortness of breath. EXAM: CHEST - 2 VIEW COMPARISON:  January 07, 2022. FINDINGS: Stable cardiomediastinal silhouette. Status post coronary bypass graft. Minimal left lingular subsegmental atelectasis is noted. Mild right basilar subsegmental atelectasis is noted. Bony thorax is unremarkable. IMPRESSION: Minimal to mild bilateral subsegmental atelectasis. Electronically Signed   By: Marijo Conception M.D.   On: 10/19/2022 18:10     Subjective: Patient was seen and examined at bedside.  Overnight events noted.   Patient reports doing much better. Her blood pressure has improved.  Patient wants to be discharged.  Home health services arranged.  Discharge Exam: Vitals:   10/23/22 0732 10/23/22 1122  BP: (!) 155/69 130/63  Pulse: 65 65  Resp: 20 (!) 22  Temp:  98.1 F (36.7 C)  SpO2:  100%   Vitals:   10/23/22 0359 10/23/22 0547 10/23/22 0732 10/23/22 1122  BP: 131/61 (!) 150/66 (!) 155/69 130/63  Pulse: 61 64 65 65  Resp: '20 20 20 '$ (!) 22  Temp: 97.9 F (36.6 C) 98 F (36.7 C)  98.1 F (36.7 C)  TempSrc: Oral Oral  Oral  SpO2: 98% 97%  100%  Weight:      Height:        General: Pt is alert, awake, not in acute distress Cardiovascular: RRR, S1/S2 +, no rubs, no gallops Respiratory: CTA bilaterally, no wheezing, no rhonchi Abdominal: Soft, NT, ND, bowel sounds + Extremities: no edema, no cyanosis    The results of significant diagnostics from this hospitalization (including imaging, microbiology, ancillary and laboratory) are listed below for reference.     Microbiology: Recent Results (from the past 240 hour(s))  Resp Panel by RT-PCR (Flu A&B, Covid) Anterior Nasal Swab     Status: None   Collection Time: 10/19/22  2:41 PM   Specimen: Anterior Nasal Swab  Result Value Ref Range Status   SARS Coronavirus 2 by RT PCR NEGATIVE NEGATIVE Final    Comment: (NOTE) SARS-CoV-2 target nucleic acids are NOT DETECTED.  The SARS-CoV-2 RNA is generally detectable in upper respiratory specimens during the acute phase of infection. The lowest concentration of SARS-CoV-2 viral copies this assay can detect is 138 copies/mL. A negative result does not preclude SARS-Cov-2 infection and should not be used as the sole basis for treatment or other patient management decisions. A negative result may occur with  improper specimen collection/handling, submission of specimen other than nasopharyngeal swab, presence of viral mutation(s) within the areas targeted by this assay, and inadequate number  of viral copies(<138 copies/mL). A negative result must be combined with clinical observations, patient history, and epidemiological information. The expected result is Negative.  Fact Sheet for Patients:  EntrepreneurPulse.com.au  Fact Sheet for Healthcare Providers:  IncredibleEmployment.be  This test is no t yet approved or cleared by the [place] FDA and  has been authorized for detection and/or diagnosis of SARS-CoV-2 by FDA under an Emergency Use Authorization (EUA). This EUA will remain  in effect (meaning this test can be used) for the duration of the COVID-19 declaration under Section 564(b)(1) of the Act, 21 U.S.C.section 360bbb-3(b)(1), unless the authorization is terminated  or revoked sooner.       Influenza A by PCR NEGATIVE NEGATIVE Final   Influenza  B by PCR NEGATIVE NEGATIVE Final    Comment: (NOTE) The Xpert Xpress SARS-CoV-2/FLU/RSV plus assay is intended as an aid in the diagnosis of influenza from Nasopharyngeal swab specimens and should not be used as a sole basis for treatment. Nasal washings and aspirates are unacceptable for Xpert Xpress SARS-CoV-2/FLU/RSV testing.  Fact Sheet for Patients: EntrepreneurPulse.com.au  Fact Sheet for Healthcare Providers: IncredibleEmployment.be  This test is not yet approved or cleared by the [place] FDA and has been authorized for detection and/or diagnosis of SARS-CoV-2 by FDA under an Emergency Use Authorization (EUA). This EUA will remain in effect (meaning this test can be used) for the duration of the COVID-19 declaration under Section 564(b)(1) of the Act, 21 U.S.C. section 360bbb-3(b)(1), unless the authorization is terminated or revoked.  Performed at [facility], [place] [address] [place], [place] [address]      Labs: BNP (last 3 results) Recent Labs    01/07/22 1228 10/19/22 1708  BNP 68.1 100.5*    Basic Metabolic Panel: Recent Labs  Lab 10/19/22 1708 10/20/22 1300 10/21/22 0420  NA 138 140 138  K 4.0 3.8 4.1  CL 104 104 101  CO2 '30 27 29  '$ GLUCOSE 180* 145* 175*  BUN '10 9 9  '$ CREATININE 0.76 0.80 0.94  CALCIUM 9.2 8.9 9.0   Liver Function Tests: Recent Labs  Lab 10/19/22 1708 10/20/22 1300 10/21/22 0420  AST '24 25 25  '$ ALT '18 17 16  '$ ALKPHOS 39 35* 34*  BILITOT 0.6 0.9 0.8  PROT 7.2 6.6 6.3*  ALBUMIN 3.9 3.3* 3.1*   Recent Labs  Lab 10/19/22 1708  LIPASE 29   No results for input(s): "AMMONIA" in the last 168 hours. CBC: Recent Labs  Lab 10/19/22 1708 10/20/22 1300 10/21/22 0420  WBC 5.7 4.3 5.8  NEUTROABS  --  2.5  --   HGB 11.5* 11.8* 11.2*  HCT 38.2 38.5 36.5  MCV 92.3 90.8 90.1  PLT 219 223 208   Cardiac Enzymes: No results for input(s): "CKTOTAL", "CKMB", "CKMBINDEX", "TROPONINI" in the last 168 hours. BNP: Invalid input(s): "POCBNP" CBG: Recent Labs  Lab 10/22/22 1140 10/22/22 1720 10/22/22 2055 10/23/22 0802 10/23/22 1117  GLUCAP 154* 258* 188* 134* 219*   D-Dimer No results for input(s): "DDIMER" in the last 72 hours. Hgb A1c No results for input(s): "HGBA1C" in the last 72 hours.  Lipid Profile No results for input(s): "CHOL", "HDL", "LDLCALC", "TRIG", "CHOLHDL", "LDLDIRECT" in the last 72 hours. Thyroid function studies No results for input(s): "TSH", "T4TOTAL", "T3FREE", "THYROIDAB" in the last 72 hours.  Invalid input(s): "FREET3" Anemia work up No results for input(s): "VITAMINB12", "FOLATE", "FERRITIN", "TIBC", "IRON", "RETICCTPCT" in the last 72 hours. Urinalysis    Component Value Date/Time   COLORURINE STRAW (A) 10/19/2022 1719   APPEARANCEUR CLEAR 10/19/2022 1719   APPEARANCEUR Clear 04/19/2019 1141   LABSPEC 1.004 (L) 10/19/2022 1719   PHURINE 7.0 10/19/2022 1719   GLUCOSEU NEGATIVE 10/19/2022 1719   GLUCOSEU NEGATIVE 10/07/2010 1050   HGBUR NEGATIVE 10/19/2022 1719   BILIRUBINUR NEGATIVE 10/19/2022 1719    BILIRUBINUR Negative 04/19/2019 1141   KETONESUR NEGATIVE 10/19/2022 1719   PROTEINUR NEGATIVE 10/19/2022 1719   UROBILINOGEN 0.2 04/20/2018 1649   NITRITE NEGATIVE 10/19/2022 1719   LEUKOCYTESUR NEGATIVE 10/19/2022 1719   Sepsis Labs Recent Labs  Lab 10/19/22 1708 10/20/22 1300 10/21/22 0420  WBC 5.7 4.3 5.8   Microbiology Recent Results (from the past 240 hour(s))  Resp Panel by RT-PCR (Flu A&B,  Covid) Anterior Nasal Swab     Status: None   Collection Time: 10/19/22  2:41 PM   Specimen: Anterior Nasal Swab  Result Value Ref Range Status   SARS Coronavirus 2 by RT PCR NEGATIVE NEGATIVE Final    Comment: (NOTE) SARS-CoV-2 target nucleic acids are NOT DETECTED.  The SARS-CoV-2 RNA is generally detectable in upper respiratory specimens during the acute phase of infection. The lowest concentration of SARS-CoV-2 viral copies this assay can detect is 138 copies/mL. A negative result does not preclude SARS-Cov-2 infection and should not be used as the sole basis for treatment or other patient management decisions. A negative result may occur with  improper specimen collection/handling, submission of specimen other than nasopharyngeal swab, presence of viral mutation(s) within the areas targeted by this assay, and inadequate number of viral copies(<138 copies/mL). A negative result must be combined with clinical observations, patient history, and epidemiological information. The expected result is Negative.  Fact Sheet for Patients:  EntrepreneurPulse.com.au  Fact Sheet for Healthcare Providers:  IncredibleEmployment.be  This test is no t yet approved or cleared by the [place] FDA and  has been authorized for detection and/or diagnosis of SARS-CoV-2 by FDA under an Emergency Use Authorization (EUA). This EUA will remain  in effect (meaning this test can be used) for the duration of the COVID-19 declaration under Section 564(b)(1) of  the Act, 21 U.S.C.section 360bbb-3(b)(1), unless the authorization is terminated  or revoked sooner.       Influenza A by PCR NEGATIVE NEGATIVE Final   Influenza B by PCR NEGATIVE NEGATIVE Final    Comment: (NOTE) The Xpert Xpress SARS-CoV-2/FLU/RSV plus assay is intended as an aid in the diagnosis of influenza from Nasopharyngeal swab specimens and should not be used as a sole basis for treatment. Nasal washings and aspirates are unacceptable for Xpert Xpress SARS-CoV-2/FLU/RSV testing.  Fact Sheet for Patients: EntrepreneurPulse.com.au  Fact Sheet for Healthcare Providers: IncredibleEmployment.be  This test is not yet approved or cleared by the [place] FDA and has been authorized for detection and/or diagnosis of SARS-CoV-2 by FDA under an Emergency Use Authorization (EUA). This EUA will remain in effect (meaning this test can be used) for the duration of the COVID-19 declaration under Section 564(b)(1) of the Act, 21 U.S.C. section 360bbb-3(b)(1), unless the authorization is terminated or revoked.  Performed at [facility], [place] [address] [place], [place] [address]      Time coordinating discharge: Over 30 minutes  SIGNED:  Shawna Clamp, MD  [facility] 10/23/2022, 1:22 PM Pager   If 7PM-7AM, please contact night-coverage

## 2022-10-23 NOTE — Discharge Instructions (Signed)
Advised to follow-up with primary care physician in 1 week. Advised to take hydralazine 50 mg 3 times daily in addition to other blood pressure medications. We will request PCP to adjust her blood pressure medications as needed

## 2022-10-23 NOTE — Plan of Care (Signed)

## 2022-10-23 NOTE — Progress Notes (Signed)
Physical Therapy Treatment Patient Details Name: [name] MRN: [id number] DOB: [birth year] Today's Date: 10/23/2022   History of Present Illness This 88 years old female with PMH significant for hyperlipidemia, hypertension, CAD s/p CABG, chronic HFpEF, type 2 diabetes, peripheral artery disease, peptic ulcer disease, rheumatoid arthritis presented in ED with complaints of nausea and vomiting. [name] admitted for hypertensive urgency.    PT Comments    Pt received standing at side of bed with NT in room, agreeable to mobilize. Pt still reporting headache, "heavy head," and L-sided facial pain. Required supervision for transfers and min guard in hallway with RW. BP at end of ambulation task 144/68, HR75. Encouraged pt to utilize RW at home for increased safety and stability, pt verbalized understanding. Pt agreeable to HHPT. We will continue to follow acutely.    Recommendations for follow up therapy are one component of a multi-disciplinary discharge planning process, led by the attending physician.  Recommendations may be updated based on patient status, additional functional criteria and insurance authorization.  Follow Up Recommendations  Home health PT     Assistance Recommended at Discharge Intermittent Supervision/Assistance  Patient can return home with the following A little help with walking and/or transfers;A little help with bathing/dressing/bathroom;Assistance with cooking/housework;Direct supervision/assist for medications management;Assist for transportation;Help with stairs or ramp for entrance   Equipment Recommendations  None recommended by PT    Recommendations for Other Services       Precautions / Restrictions Precautions Precautions: Fall Precaution Comments: watch BP Restrictions Weight Bearing Restrictions: No     Mobility  Bed Mobility Overal bed mobility: Modified Independent             General bed mobility comments: OOB with NT at entry and  in recliner at exit    Transfers Overall transfer level: Needs assistance Equipment used: None Transfers: Sit to/from Stand Sit to Stand: Supervision           General transfer comment: Supervision for safety    Ambulation/Gait Ambulation/Gait assistance: Min guard Gait Distance (Feet): 120 Feet Assistive device: Rolling walker (2 wheels) Gait Pattern/deviations: Step-through pattern, Decreased stride length Gait velocity: decr     General Gait Details: Pt ambulated with Rw and min guard, no overt LOB noted or physical assist required. Pt required two standing rest breaks secondary to fatigue.   Stairs             Wheelchair Mobility    Modified Rankin (Stroke Patients Only)       Balance Overall balance assessment: Mild deficits observed, not formally tested                                          Cognition Arousal/Alertness: Awake/alert Behavior During Therapy: WFL for tasks assessed/performed Overall Cognitive Status: Within Functional Limits for tasks assessed                                          Exercises      General Comments        Pertinent Vitals/Pain Pain Assessment Pain Assessment: Faces Faces Pain Scale: Hurts little more Pain Location: Headache, heavy head, Lsided face Pain Descriptors / Indicators: Headache Pain Intervention(s): Limited activity within patient's tolerance, Monitored during session, Repositioned    Home Living  Prior Function            PT Goals (current goals can now be found in the care plan section) Acute Rehab PT Goals Patient Stated Goal: get home PT Goal Formulation: With patient Time For Goal Achievement: 11/05/22 Potential to Achieve Goals: Good Progress towards PT goals: Progressing toward goals    Frequency    Min 3X/week      PT Plan Current plan remains appropriate    Co-evaluation              AM-PAC  PT "6 Clicks" Mobility   Outcome Measure  Help needed turning from your back to your side while in a flat bed without using bedrails?: A Little Help needed moving from lying on your back to sitting on the side of a flat bed without using bedrails?: A Little Help needed moving to and from a bed to a chair (including a wheelchair)?: A Little Help needed standing up from a chair using your arms (e.g., wheelchair or bedside chair)?: A Little Help needed to walk in hospital room?: A Little Help needed climbing 3-5 steps with a railing? : A Little 6 Click Score: 18    End of Session Equipment Utilized During Treatment: Gait belt Activity Tolerance: Patient tolerated treatment well Patient left: in chair;with call bell/phone within reach;with chair alarm set Nurse Communication: Mobility status PT Visit Diagnosis: Muscle weakness (generalized) (M62.81);Difficulty in walking, not elsewhere classified (R26.2);Other abnormalities of gait and mobility (R26.89)     Time: 9892-1194 PT Time Calculation (min) (ACUTE ONLY): 15 min  Charges:  $Gait Training: 8-22 mins                     Coolidge Breeze, PT, DPT [facility] Department Office: [phone] Weekend pager: [phone]   Coolidge Breeze 10/23/2022, 12:51 PM

## 2022-11-24 ENCOUNTER — Ambulatory Visit (HOSPITAL_COMMUNITY)
Admission: EM | Admit: 2022-11-24 | Discharge: 2022-11-24 | Disposition: A | Discharge status: Home / Self Care | Payer: Medicare Other | Attending: Internal Medicine | Admitting: Internal Medicine

## 2022-11-24 ENCOUNTER — Encounter (HOSPITAL_COMMUNITY): Payer: Self-pay

## 2022-11-24 ENCOUNTER — Ambulatory Visit (INDEPENDENT_AMBULATORY_CARE_PROVIDER_SITE_OTHER): Payer: Medicare Other

## 2022-11-24 DIAGNOSIS — W19XXXA Unspecified fall, initial encounter: Secondary | ICD-10-CM | POA: Insufficient documentation

## 2022-11-24 DIAGNOSIS — M25551 Pain in right hip: Secondary | ICD-10-CM | POA: Diagnosis not present

## 2022-11-24 DIAGNOSIS — Y92009 Unspecified place in unspecified non-institutional (private) residence as the place of occurrence of the external cause: Secondary | ICD-10-CM | POA: Diagnosis not present

## 2022-11-24 DIAGNOSIS — Z7902 Long term (current) use of antithrombotics/antiplatelets: Secondary | ICD-10-CM | POA: Insufficient documentation

## 2022-11-24 DIAGNOSIS — M7918 Myalgia, other site: Secondary | ICD-10-CM

## 2022-11-24 DIAGNOSIS — E1142 Type 2 diabetes mellitus with diabetic polyneuropathy: Secondary | ICD-10-CM | POA: Diagnosis not present

## 2022-11-24 DIAGNOSIS — Z7901 Long term (current) use of anticoagulants: Secondary | ICD-10-CM | POA: Insufficient documentation

## 2022-11-24 DIAGNOSIS — M545 Low back pain, unspecified: Secondary | ICD-10-CM | POA: Diagnosis present

## 2022-11-24 DIAGNOSIS — R102 Pelvic and perineal pain: Secondary | ICD-10-CM | POA: Diagnosis not present

## 2022-11-24 DIAGNOSIS — Z96643 Presence of artificial hip joint, bilateral: Secondary | ICD-10-CM | POA: Insufficient documentation

## 2022-11-24 NOTE — ED Triage Notes (Addendum)
Pt presents to the office for a fall onto her bathroom floor. Pt reports hip pain.and arm pain.  Denies any LOS.

## 2022-11-24 NOTE — Discharge Instructions (Addendum)
Your x-ray of your hip is negative for fracture or dislocation. Take tylenol 1,'000mg'$  every 6 hours as needed for pain and inflammation. Continue applying topical creams to the area to help with muscle tenderness. You may also apply heat to the area with a heating pad 20 minutes on 20 minutes off in intervals to relax the muscles in the area causing pain.   Please schedule an appointment with Jean Rosenthal MD orthopedics to follow-up if your symptoms do not improve in the next 5-7 days.   Your urine is negative for signs of infection.  If you develop any new or worsening symptoms or do not improve in the next 2 to 3 days, please return.  If your symptoms are severe, please go to the emergency room.  Follow-up with your primary care provider for further evaluation and management of your symptoms as well as ongoing wellness visits.  I hope you feel better!

## 2022-11-24 NOTE — ED Provider Notes (Signed)
[name]    CSN: [id number] Arrival date & time: 11/24/22  1831      History   Chief Complaint Chief Complaint  Patient presents with   Fall    HPI [name] is a 88 y.o. female.   Patient presents urgent care with her adult daughter for evaluation after she suffered a mechanical fall today while in the bathroom.  Patient states she was attempting to go to the bathroom to urinate when she experienced a fall.  Patient normally grabs onto a grab bar near the toilet but did not realize that the object she was grabbing onto was actually the shower curtain.  Patient fell onto her buttocks onto the floor after grabbing the shower curtain thinking it was the grab bar next to the toilet. She does take plavix blood thinner, denies hitting her head. She did not lose consciousness, or experience any preceding nausea, vomiting, dizziness, chest pain, or shortness of breath.  Significant past medical history of type 2 diabetes.  She has been eating normally and taking all of her medications as prescribed.  Diabetes is well-controlled, last hemoglobin A1c was 7.  Patient had eaten prior to falling.  Denies urinary frequency, dysuria, urinary urgency, urinary hesitancy, and odor to urine.  Denies recent gastrointestinal illnesses causing dehydration related to nausea, vomiting, or diarrhea.  Denies fever, chills, URI symptoms, confusion, dizziness, nausea, headache, blurry vision, one-sided weakness, and lightheadedness.  No numbness/tingling to the bilateral lower extremities or saddle anesthesia symptoms. She has not fallen recently in the last 6 months and is ambulatory to her baseline with a cane for assistance.  Daughter states patient is neurologically at her baseline at this moment.  Experiencing pain to the right lower back and right pelvic area.  History of bilateral hip replacements.  Patient applied Aspercreme to the area of greatest tenderness and states this helped some with the  pain.   Fall    Past Medical History:  Diagnosis Date   Allergy to IVP dye    Angina, class I ([place]) 04/22/2015   Atherosclerotic heart disease native coronary artery w/angina pectoris ([place]) 1992   a. 1992 s/p PTCA of LAD;  b. 1998 CABG x 3; c. 07/2001 Cath: Sev LM/LAD/LCX dzs, 3/3 patent grafts; d. 11/2013 Cath: stable graft anatomy; e. 10/2016 Cath: native 3VD, VG->OM nl, LIMA->LAD nl, VG->Diag 55.   Atherosclerotic heart disease of native coronary artery with angina pectoris ([place]) 01/14/2010   Qualifier: Diagnosis of  By: Deatra Ina MD, Sandy Salaam    Atrial septal aneurysm    Barrett's esophagus 03/04/2010   Qualifier: Diagnosis of  By: Shane Crutch, Amy S    Bilateral leg edema 04/22/2015   Bradycardia, mild briefly to the 40s, asymptomatic 05/16/2013   Carotid arterial disease ([place])    a. 05/2014 Carotid U/S: No signif bilat ICA stenosis, >60% L ECA.   Chronic anemia    Chronic diastolic CHF (congestive heart failure) ([place])    a. 10/2016 Echo: Ef 55-60%, no rwma, Gr1 DD, triv AI, PASP 39mHg, atrial septal aneurysm.   Chronic diastolic CHF (congestive heart failure), NYHA class 2 ([place] 08/11/2017   Claudication ([place] 05/16/2013   Peripheral arterial occlusive disease with lifestyle limiting claudication    Degenerative joint disease 05/30/2018   DIVERTICULOSIS OF COLON 01/14/2010   Qualifier: Diagnosis of  By: KDeatra InaMD, RSandy Salaam   DM (diabetes mellitus), type 2 with peripheral vascular complications ([place]    On Oral medications.   DOE (  dyspnea on exertion) 04/22/2015   Essential hypertension    FECAL INCONTINENCE 03/04/2010   Qualifier: Diagnosis of  By: Shane Crutch, Amy S    GERD (gastroesophageal reflux disease)    Hyperlipidemia with target LDL less than 70    Inflammatory arthritis 10/13/2017   Neuropathy    Non-insulin dependent type 2 diabetes mellitus ([place]) 10/24/2015   NSTEMI (non-ST elevated myocardial infarction) ([place]) 11/01/2016   PAD (peripheral artery  disease) ([place]) 05/2013; 09/2013   a. severe calcified POP-1 & TP Trunk Dz bilat;  b . 05/2013 Diamondback Rot Athrectomy (R POP) --> PTA R Pop, DES to R Peroneal;  c. 09/2013 PTA/Stenting of L Pop [name] - retrograde access);  d. 04/2014 ABI's: R/L: 1.1/1.1.   Preoperative cardiovascular examination 04/15/2020   PUD (peptic ulcer disease)    Rheumatoid arthritis ([place])    S/P CABG x 3 1998   LIMA-LAD, SVG-OM, SVG-D1   Severe claudication ([place]) 05/2013   Referred for Peripheral Angio (Dr. Gwenlyn Found --> Dr. Brunetta Jeans in [place])   [place] 01/14/2010   Qualifier: Diagnosis of  By: Harlon Ditty CMA (AAMA), [name]     Status post total replacement of right hip 10/12/2018   Vertigo 01/02/2014    Patient Active Problem List   Diagnosis Date Noted   Hypertensive urgency 10/20/2022   Nausea and vomiting 10/20/2022   Anxiety 11/04/2021   Avascular necrosis ([place]) 11/04/2021   Chronic interstitial cystitis 11/04/2021   Diabetic peripheral neuropathy associated with type 2 diabetes mellitus ([place]) 11/04/2021   Polyneuropathy due to type 2 diabetes mellitus ([place]) 11/04/2021   Eczema 11/04/2021   History of urinary tract infection 11/04/2021   Neuropathy 11/04/2021   Other long term (current) drug therapy 11/04/2021   Peptic ulcer disease 11/04/2021   Presence of aortocoronary bypass graft 11/04/2021   Skin sensation disturbance 11/04/2021   Vitamin D deficiency 11/04/2021   Gastroesophageal reflux disease 11/04/2021   Rheumatoid arthritis ([place]) 11/04/2021   Peripheral vascular disease ([place]) 11/04/2021   Type 2 diabetes mellitus with other specified complication ([place]) 40/09/2724   Chronic diastolic heart failure ([place]) 01/07/2021   Status post total hip replacement, right 12/04/2020   Status post total replacement of left hip 11/21/2020   Near syncope 07/10/2020   Hx of CABG 07/10/2020   Preoperative cardiovascular examination 04/15/2020   Chest pain 03/18/2020   Unilateral primary  osteoarthritis, left hip 04/03/2019   History of right hip replacement 04/03/2019   Status post total replacement of right hip 10/12/2018   Unilateral primary osteoarthritis, right hip 09/12/2018   Degenerative joint disease 05/30/2018   Anal fissure 11/10/2017   Rectal bleeding 11/10/2017   Rectal pain 11/10/2017   Inflammatory arthritis 10/13/2017   Latent tuberculosis by blood test 10/13/2017   Chronic diastolic CHF (congestive heart failure), NYHA class 2 ([place]) 08/11/2017   Fatigue due to treatment 01/09/2017   NSTEMI (non-ST elevated myocardial infarction) ([place]) 11/01/2016   Diabetes mellitus ([place]) 10/24/2015   Non-insulin dependent type 2 diabetes mellitus ([place]) 10/24/2015   Angina, class I ([place]) 04/22/2015   DOE (dyspnea on exertion) 04/22/2015   Bilateral leg edema 04/22/2015   Accumulation of fluid in tissues 03/21/2014   Vertigo, central 01/02/2014   Vertigo 01/02/2014   Limb pain- echymosis, pain Lt radial artery after cath 12/11/2013   Dyslipidemia, goal LDL below 70 05/17/2013   Essential hypertension 05/17/2013   Claudication ([place]) 05/16/2013   PTA/ Stent Rt popliteal and Rt peroneal artery 05/16/13 05/16/2013   PAD,severe  calcified pop and tibial peroneal trunk disease bilateraly    ABDOMINAL PAIN RIGHT LOWER QUADRANT 10/07/2010   CVA-STROKE 03/04/2010   BARRETT'S ESOPHAGUS 03/04/2010   FECAL INCONTINENCE 03/04/2010   DM 01/14/2010   Anemia of chronic disease 01/14/2010   CAD (coronary artery disease) 01/14/2010   DIVERTICULOSIS OF COLON 01/14/2010   PERSONAL HISTORY OF COLONIC POLYPS 01/14/2010   SHELLFISH ALLERGY 01/14/2010    Past Surgical History:  Procedure Laterality Date   ATHERECTOMY Right 05/15/2013   Procedure: ATHERECTOMY;  Surgeon: Lorretta Harp, MD;  Location[facility] CATH LAB;  Service: Cardiovascular;  Laterality: Right;  popliteal   BACK SURGERY     CARDIAC CATHETERIZATION  07/17/01   2 v CAD with LM, circ, LAD, obtuse diag, mod stenosis of diag  vein graft , mild stenosis of marg vein graft, nl EF, medical treatment   CARDIAC CATHETERIZATION  11/2013   Widely patent LIMA-LAD & SVG-OM with mild progression of proximal stenosis ~40-50% in SVG-D1; Widely patent native RCA with known severe native LCA disease   CARDIAC CATHETERIZATION N/A 11/01/2016   Procedure: Left Heart Cath and Cors/Grafts Angiography;  Surgeon: [name], MD;  Location: [facility] CV LAB;  Service: Cardiovascular: Hemodynamics: High LVEDP!.  Cors/Grafts: LM 55%, o-p LAD 100% then after D1 -> LIMA-LAD patent, SVG-Diag ~55%. small RI wiht ~70% ostial. O-p Cx 80% - pCx 70% --> SVG-bifurcating OM patent. -- med Rx   CORONARY ANGIOPLASTY  1992   PCI to LAD   CORONARY ARTERY BYPASS GRAFT  1998   LIMA-LAD, SVG-diagonal (none roughly 50% stenosis), SVG-OM   LEFT HEART CATHETERIZATION WITH CORONARY ANGIOGRAM N/A 11/27/2013   Procedure: LEFT HEART CATHETERIZATION WITH CORONARY ANGIOGRAM;  Surgeon: [name], MD;  Location: [facility] CATH LAB;  Service: Cardiovascular;  Laterality: N/A;   LOW EXTREMITY DOPPLERS/ABI  10/2016   Patent right SFA, popliteal and peroneal tendons. Patent left SFA and popliteal stent. 30-50% stenosis in the right common femoral and popliteal arteries, 50-74% stenosis in left profunda femoris artery. --> 1 year follow-up.  RABI - 1.2, LABI 1.1   LOWER EXTREMITY ANGIOGRAM N/A 02/22/2013   Procedure: LOWER EXTREMITY ANGIOGRAM;  Surgeon: Lorretta Harp, MD;  Location: [facility] CATH LAB;  Service: Cardiovascular;  Laterality: N/A;   LOWER EXTREMITY ANGIOGRAM N/A 07/20/2013   Procedure: LOWER EXTREMITY ANGIOGRAM;  Surgeon: Lorretta Harp, MD;  Location[facility] CATH LAB;  Service: Cardiovascular;  Laterality: N/A;   NM MYOVIEW LTD  04/03/2013; 5/26'/2016   Lexiscan: a)  Apical perfusion defect with no ischemia. Consider prior infarct versus breast attenuation.;; b) 5/'16: LOW RISK, Nl EF ~55-65%, No Ischemia /Infarction   NM MYOVIEW LTD  10/'19; 4/'21   a) LOW  RISK.  EF 55%.  No ischemia or infarction. b) EF estimated 81%.  Suboptimal images.  May be subtle inferior ischemia-initially read by radiology as intermediate risk, however overread by cardiology to be mild low risk.Marland Kitchen   PERCUTANEOUS STENT INTERVENTION Right 05/15/2013   Procedure: PERCUTANEOUS STENT INTERVENTION;  Surgeon: Lorretta Harp, MD;  Location: [facility] CATH LAB;  Service: Cardiovascular;  Laterality: Right;  tibioperoneal trunk   Peroneal Artery Stent  05/15/13   Diamondback rot. atherectomy of high grade segmental popliteal stenosis then Chocolate balloonPTA; then angiosculpt PTA of the prox peroneal with stent-Xpedition    pv angio  07/20/13   high-grade calcified popliteal disease with one-vessel runoff via a small anterior tibial artery that has proximal disease as well, no intervention   TOTAL HIP  ARTHROPLASTY Right 10/12/2018   Procedure: RIGHT TOTAL HIP ARTHROPLASTY ANTERIOR APPROACH;  Surgeon: Mcarthur Rossetti, MD;  Location: [facility] ORS;  Service: Orthopedics;  Laterality: Right;   TOTAL HIP ARTHROPLASTY Left 11/21/2020   Procedure: LEFT TOTAL HIP ARTHROPLASTY ANTERIOR APPROACH;  Surgeon: Mcarthur Rossetti, MD;  Location: [facility] ORS;  Service: Orthopedics;  Laterality: Left;   TRANSTHORACIC ECHOCARDIOGRAM  11/'17; 10/19   a) EF 55-60%. Gr 1 DD. Normal PAP. Aortic Sclerosis.;; b) Normal LV size and function with vigorous EF 65 to 70%.  GR 1 DD.  Severe LA dilation (suggestive of probably worsening: GR 1 DD).   TRANSTHORACIC ECHOCARDIOGRAM  03/18/2020   EF 70-75%.  Hyperdynamic.  GR 1 DD.  No R WMA.  Mild LA dilation.  Mild aortic valve sclerosis but no stenosis.-No change from prior echo   TUBAL LIGATION      OB History   No obstetric history on file.      Home Medications    Prior to Admission medications   Medication Sig Start Date End Date Taking? Authorizing Provider  acetaminophen (TYLENOL) 650 MG CR tablet Take 650 mg by mouth every 8 (eight) hours as needed for pain.     [provider]  acetaminophen-codeine (TYLENOL #3) 300-30 MG tablet Take 1-2 tablets by mouth every 8 (eight) hours as needed. 03/20/21   Mcarthur Rossetti, MD  alendronate (FOSAMAX) 70 MG tablet Take 70 mg by mouth once a week. Patient not taking: Reported on 10/19/2022    [provider]  carvedilol (COREG) 6.25 MG tablet TAKE 1 TABLET BY MOUTH TWICE A DAY 09/07/22   Lorretta Harp, MD  Cholecalciferol (VITAMIN D3) 10 MCG (400 UNIT) tablet Take 400 Units by mouth daily.    [provider]  clopidogrel (PLAVIX) 75 MG tablet Take 75 mg by mouth daily.    [provider]  Coenzyme Q10 (CO Q 10 PO) Take 1 tablet by mouth every other day.     [provider]  ezetimibe (ZETIA) 10 MG tablet Take 10 mg by mouth at bedtime.    [provider]  folic acid (FOLVITE) 1 MG tablet Take 1 mg by mouth daily. 08/21/18   [provider]  gabapentin (NEURONTIN) 400 MG capsule Take 400 mg by mouth 2 (two) times daily. 09/07/22   [provider]  glimepiride (AMARYL) 2 MG tablet Take 2 mg by mouth daily as needed. 01/07/22   [provider]  hydrALAZINE (APRESOLINE) 50 MG tablet Take 1 tablet (50 mg total) by mouth every 8 (eight) hours. 10/23/22   Shawna Clamp, MD  isosorbide mononitrate (IMDUR) 60 MG 24 hr tablet TAKE 1.5 TABLETS (90 MG TOTAL) BY MOUTH DAILY. Marland Kitchen Patient taking differently: Take 60 mg by mouth daily. . 04/12/22   Lorretta Harp, MD  LORazepam (ATIVAN) 0.5 MG tablet Take 0.5 mg by mouth daily as needed. 12/04/21   [provider]  methotrexate (RHEUMATREX) 2.5 MG tablet Take 20 mg by mouth every Sunday. (8 tablets) 08/21/18   [provider]  nitroGLYCERIN (NITROSTAT) 0.4 MG SL tablet PLACE 1 TABLET (0.4 MG TOTAL) UNDER THE TONGUE EVERY 5 (FIVE) MINUTES AS NEEDED FOR CHEST PAIN. 02/13/18   Theora Gianotti, NP  pantoprazole (PROTONIX) 40 MG tablet Take 1 tablet (40 mg total) by mouth 2 (two)  times daily before a meal. 03/19/20   Lavina Hamman, MD  predniSONE (DELTASONE) 5 MG tablet Take 5 mg by mouth daily with breakfast.  [provider]  rosuvastatin (CRESTOR) 40 MG tablet TAKE 1 TABLET BY MOUTH EVERYDAY AT BEDTIME Patient taking differently: Take 40 mg by mouth daily. 01/16/21   Lorretta Harp, MD  sulfaSALAzine (AZULFIDINE) 500 MG EC tablet Take 500 mg by mouth 2 (two) times daily.    [provider]    Family History Family History  Adopted: Yes  Problem Relation Age of Onset   Hypertension Mother    Hyperlipidemia Mother    Hyperlipidemia Father    Hypertension Father     Social History Social History   Tobacco Use   Smoking status: Never   Smokeless tobacco: Never  Vaping Use   Vaping Use: Never used  Substance Use Topics   Alcohol use: Not Currently   Drug use: No     Allergies   Fish allergy, Iodinated contrast media, Latex, Shellfish allergy, Amlodipine, Evolocumab, Other, and Metformin   Review of Systems Review of Systems Per HPI  Physical Exam Triage Vital Signs ED Triage Vitals [11/24/22 1848]  Enc Vitals Group     BP (!) 176/76     Pulse Rate 82     Resp 18     Temp 98.1 F (36.7 C)     Temp Source Oral     SpO2 98 %     Weight      Height      Head Circumference      Peak Flow      Pain Score      Pain Loc      Pain Edu?      Excl. in [place]?    No data found.  Updated Vital Signs BP (!) 176/76 (BP Location: Left Arm)   Pulse 82   Temp 98.1 F (36.7 C) (Oral)   Resp 18   SpO2 98%   Visual Acuity Right Eye Distance:   Left Eye Distance:   Bilateral Distance:    Right Eye Near:   Left Eye Near:    Bilateral Near:     Physical Exam Vitals and nursing note reviewed.  Constitutional:      Appearance: She is not ill-appearing or toxic-appearing.  HENT:     Head: Normocephalic and atraumatic.     Right Ear: Hearing, tympanic membrane, ear canal and external ear normal.     Left Ear: Hearing,  tympanic membrane, ear canal and external ear normal.     Nose: Nose normal.     Mouth/Throat:     Lips: Pink.     Mouth: Mucous membranes are moist.     Pharynx: No posterior oropharyngeal erythema.  Eyes:     General: Lids are normal. Vision grossly intact. Gaze aligned appropriately.     Extraocular Movements: Extraocular movements intact.     Conjunctiva/sclera: Conjunctivae normal.  Cardiovascular:     Rate and Rhythm: Normal rate and regular rhythm.     Heart sounds: Normal heart sounds, S1 normal and S2 normal.  Pulmonary:     Effort: Pulmonary effort is normal. No respiratory distress.     Breath sounds: Normal breath sounds and air entry.  Musculoskeletal:     Cervical back: Neck supple.     Right lower leg: Edema present.     Left lower leg: Edema present.     Comments: Leg swelling at baseline.  +2 bilateral dorsalis pedis pulses, less than 3 capillary refill. Tender to palpation of the right hip bones and lumbar paraspinal muscles.  No obvious deformity, limb  shortening, abnormal rotation of the right lower extremity, ecchymosis, erythema, swelling, warmth, or laceration/abrasion.  Sensation intact distal to injury bilaterally.  Ambulatory with steady gait at baseline with cane without assistance.  Skin:    General: Skin is warm and dry.     Capillary Refill: Capillary refill takes less than 2 seconds.     Findings: No rash.  Neurological:     General: No focal deficit present.     Mental Status: She is alert and oriented to person, place, and time. Mental status is at baseline.     Cranial Nerves: No dysarthria or facial asymmetry.     Comments: 5/5 grip strength to bilateral upper extremities, 5/5 strength against resistance with dorsiflexion and plantarflexion of the bilateral lower extremities.  Nonfocal neurologic exam.  Psychiatric:        Mood and Affect: Mood normal.        Speech: Speech normal.        Behavior: Behavior normal.        Thought Content: Thought  content normal.        Judgment: Judgment normal.      UC Treatments / Results  Labs (all labs ordered are listed, but only abnormal results are displayed) Labs Reviewed  POCT URINALYSIS DIP (DEVICE) - Abnormal; Notable for the following components:      Result Value   Glucose, UA 250 (*)    Ketones, ur TRACE (*)    All other components within normal limits  POCT URINALYSIS DIPSTICK, ED / UC    EKG   Radiology No results found.  Procedures Procedures (including critical care time)  Medications Ordered in UC Medications - No data to display  Initial Impression / Assessment and Plan / UC Course  I have reviewed the triage vital signs and the nursing notes.  Pertinent labs & imaging results that were available during my care of the patient were reviewed by me and considered in my medical decision making (see chart for details).   1. Fall in home, right hip pain, lumbar strain, long-term use of anticoagulant Imaging of left hip negative for acute bony abnormality, patient is ambulatory with cane and neurovascularly at baseline. Urinalysis is unremarkable for signs of UTI. Presentation is consistent with mechanical fall resulting in lumbar strain and right hip pain. May continue use of over the counter asper cream and tylenol 1,'000mg'$  every 6 hours as needed for pain. Heat to the area 20 minutes on 20 minutes off recommended for muscle spasm and tenderness. Patient currently sees Jean Rosenthal MD orthopedics, may schedule a follow-up appointment with him to be seen if symptoms fail to improve with conservative care in the next 5-7 days. Daughter and patient agreeable with plan.   Discussed physical exam and available lab work findings in clinic with patient.  Counseled patient regarding appropriate use of medications and potential side effects for all medications recommended or prescribed today. Discussed red flag signs and symptoms of worsening condition,when to call the PCP  office, return to urgent care, and when to seek higher level of care in the emergency department. Patient verbalizes understanding and agreement with plan. All questions answered. Patient discharged in stable condition.    Final Clinical Impressions(s) / UC Diagnoses   Final diagnoses:  Fall in home, initial encounter  Long term current use of anticoagulant  Right hip pain  Lumbar muscle pain     Discharge Instructions      Your x-ray of your hip is negative  for fracture or dislocation. Take tylenol 1,'000mg'$  every 6 hours as needed for pain and inflammation. Continue applying topical creams to the area to help with muscle tenderness. You may also apply heat to the area with a heating pad 20 minutes on 20 minutes off in intervals to relax the muscles in the area causing pain.   Please schedule an appointment with Jean Rosenthal MD orthopedics to follow-up if your symptoms do not improve in the next 5-7 days.   Your urine is negative for signs of infection.  If you develop any new or worsening symptoms or do not improve in the next 2 to 3 days, please return.  If your symptoms are severe, please go to the emergency room.  Follow-up with your primary care provider for further evaluation and management of your symptoms as well as ongoing wellness visits.  I hope you feel better!     ED Prescriptions   None    PDMP not reviewed this encounter.   Talbot Grumbling, [place] 11/27/22 2046

## 2022-11-25 LAB — POCT URINALYSIS DIP (DEVICE)
Bilirubin Urine: NEGATIVE
Glucose, UA: 250 mg/dL — AB
Hgb urine dipstick: NEGATIVE
Leukocytes,Ua: NEGATIVE
Nitrite: NEGATIVE
Protein, ur: NEGATIVE mg/dL
Specific Gravity, Urine: 1.02 (ref 1.005–1.030)
Urobilinogen, UA: 0.2 mg/dL (ref 0.0–1.0)
pH: 5 (ref 5.0–8.0)

## 2022-11-29 ENCOUNTER — Ambulatory Visit (INDEPENDENT_AMBULATORY_CARE_PROVIDER_SITE_OTHER): Payer: Medicare Other

## 2022-11-29 ENCOUNTER — Encounter: Payer: Self-pay | Admitting: Physician Assistant

## 2022-11-29 ENCOUNTER — Ambulatory Visit (INDEPENDENT_AMBULATORY_CARE_PROVIDER_SITE_OTHER): Payer: Medicare Other | Admitting: Physician Assistant

## 2022-11-29 DIAGNOSIS — M79601 Pain in right arm: Secondary | ICD-10-CM

## 2022-11-29 DIAGNOSIS — M25551 Pain in right hip: Secondary | ICD-10-CM | POA: Diagnosis not present

## 2022-11-29 NOTE — Progress Notes (Signed)
Office Visit Note   Patient: [name]           Date of Birth: January 21, 1933           MRN: [id number] Visit Date: 11/29/2022              Requested by: Janie Morning, DO [address] [place],  [place] [id number] PCP: Janie Morning, DO  Chief Complaint  Patient presents with   Right Hip - Pain   Left Hip - Pain      HPI: Patient is a pleasant 88 year old woman who comes in today for follow-up on her hips and her right arm pain.  She is 5 days status post falling in her bathroom.  She was sitting on the commode and reached for a shower curtain with her right arm.  She denies landing on her right arm.  She was not sore right away but was concerned because she seemed to be sore all over.  She was seen and evaluated in the emergency room.  No fractures were noted.  She comes in today in follow-up.  She admits she is a little bit better.  She is using a cane which is her norm.  She complains mostly of pain in the bicep and still pain in her right hip.  Assessment & Plan: Visit Diagnoses:  1. Pain in right hip     Plan: She is already overall improving.  I think she probably has a lot of stiffness and soreness from the fall.  She is ambulating with her cane she has good use of her arm.  Not tender in the elbow and her biceps and triceps tendons are intact.  Would like to reevaluate how she is doing in a week may cancel if she is doing better.  Follow-Up Instructions: Return in about 1 week (around 12/06/2022).   Ortho Exam  Patient is alert, oriented, no adenopathy, well-dressed, normal affect, normal respiratory effort. Examination of her right arm she has no ecchymosis no swelling she has good grip strength sensation is intact she has a good strong radial pulse she has good flexion and extension strength.  She is able to easily bring her arm over her head..  Examination of her hip she has no pain with manipulation of her hip.  She has 5 out of 5 strength with resisted  dorsiflexion and plantarflexion.  She is neurovascular intact  Imaging: XR HIP UNILAT W OR W/O PELVIS 2-3 VIEWS RIGHT  Result Date: 11/29/2022 Dedicated radiographs of her right hip were obtained today.  She is status post right total hip arthroplasty prosthetic is in good position.  No evidence of periprosthetic fracture.  No images are attached to the encounter.  Labs: Lab Results  Component Value Date   HGBA1C 8.4 (H) 10/20/2022   HGBA1C 7.3 (H) 11/17/2020   HGBA1C 7.7 (H) 03/18/2020   CRP 0.9 03/19/2020   REPTSTATUS 11/20/2020 FINAL 11/17/2020   CULT (A) 11/17/2020    20,000 COLONIES/mL KLEBSIELLA PNEUMONIAE 30,000 COLONIES/mL ESCHERICHIA COLI    LABORGA KLEBSIELLA PNEUMONIAE (A) 11/17/2020   LABORGA ESCHERICHIA COLI (A) 11/17/2020     Lab Results  Component Value Date   ALBUMIN 3.1 (L) 10/21/2022   ALBUMIN 3.3 (L) 10/20/2022   ALBUMIN 3.9 10/19/2022    Lab Results  Component Value Date   MG 2.2 11/17/2020   No results found for: "VD25OH"  No results found for: "PREALBUMIN"    Latest Ref Rng & Units 10/21/2022  4:20 AM 10/20/2022    1:00 PM 10/19/2022    5:08 PM  CBC EXTENDED  WBC 4.0 - 10.5 K/uL 5.8  4.3  5.7   RBC 3.87 - 5.11 MIL/uL 4.05  4.24  4.14   Hemoglobin 12.0 - 15.0 g/dL 11.2  11.8  11.5   HCT 36.0 - 46.0 % 36.5  38.5  38.2   Platelets 150 - 400 K/uL 208  223  219   NEUT# 1.7 - 7.7 K/uL  2.5    Lymph# 0.7 - 4.0 K/uL  1.3       There is no height or weight on file to calculate BMI.  Orders:  Orders Placed This Encounter  Procedures   XR HIP UNILAT W OR W/O PELVIS 2-3 VIEWS RIGHT   No orders of the defined types were placed in this encounter.    Procedures: No procedures performed  Clinical Data: No additional findings.  ROS:  All other systems negative, except as noted in the HPI. Review of Systems  Objective: Vital Signs: There were no vitals taken for this visit.  Specialty Comments:  No specialty comments  available.  PMFS History: Patient Active Problem List   Diagnosis Date Noted   Pain in right hip 11/29/2022   Hypertensive urgency 10/20/2022   Nausea and vomiting 10/20/2022   Anxiety 11/04/2021   Avascular necrosis ([place]) 11/04/2021   Chronic interstitial cystitis 11/04/2021   Diabetic peripheral neuropathy associated with type 2 diabetes mellitus ([place]) 11/04/2021   Polyneuropathy due to type 2 diabetes mellitus ([place]) 11/04/2021   Eczema 11/04/2021   History of urinary tract infection 11/04/2021   Neuropathy 11/04/2021   Other long term (current) drug therapy 11/04/2021   Peptic ulcer disease 11/04/2021   Presence of aortocoronary bypass graft 11/04/2021   Skin sensation disturbance 11/04/2021   Vitamin D deficiency 11/04/2021   Gastroesophageal reflux disease 11/04/2021   Rheumatoid arthritis ([place]) 11/04/2021   Peripheral vascular disease ([place]) 11/04/2021   Type 2 diabetes mellitus with other specified complication ([place]) 98/92/1194   Chronic diastolic heart failure ([place]) 01/07/2021   Status post total hip replacement, right 12/04/2020   Status post total replacement of left hip 11/21/2020   Near syncope 07/10/2020   Hx of CABG 07/10/2020   Preoperative cardiovascular examination 04/15/2020   Chest pain 03/18/2020   Unilateral primary osteoarthritis, left hip 04/03/2019   History of right hip replacement 04/03/2019   Status post total replacement of right hip 10/12/2018   Unilateral primary osteoarthritis, right hip 09/12/2018   Degenerative joint disease 05/30/2018   Anal fissure 11/10/2017   Rectal bleeding 11/10/2017   Rectal pain 11/10/2017   Inflammatory arthritis 10/13/2017   Latent tuberculosis by blood test 10/13/2017   Chronic diastolic CHF (congestive heart failure), NYHA class 2 ([place]) 08/11/2017   Fatigue due to treatment 01/09/2017   NSTEMI (non-ST elevated myocardial infarction) ([place]) 11/01/2016   Diabetes mellitus ([place]) 10/24/2015   Non-insulin dependent  type 2 diabetes mellitus ([place]) 10/24/2015   Angina, class I ([place]) 04/22/2015   DOE (dyspnea on exertion) 04/22/2015   Bilateral leg edema 04/22/2015   Accumulation of fluid in tissues 03/21/2014   Vertigo, central 01/02/2014   Vertigo 01/02/2014   Limb pain- echymosis, pain Lt radial artery after cath 12/11/2013   Dyslipidemia, goal LDL below 70 05/17/2013   Essential hypertension 05/17/2013   Claudication ([place]) 05/16/2013   PTA/ Stent Rt popliteal and Rt peroneal artery 05/16/13 05/16/2013   PAD,severe calcified pop and tibial peroneal trunk disease  bilateraly    ABDOMINAL PAIN RIGHT LOWER QUADRANT 10/07/2010   CVA-STROKE 03/04/2010   BARRETT'S ESOPHAGUS 03/04/2010   FECAL INCONTINENCE 03/04/2010   DM 01/14/2010   Anemia of chronic disease 01/14/2010   CAD (coronary artery disease) 01/14/2010   DIVERTICULOSIS OF COLON 01/14/2010   PERSONAL HISTORY OF COLONIC POLYPS 01/14/2010   SHELLFISH ALLERGY 01/14/2010   Past Medical History:  Diagnosis Date   Allergy to IVP dye    Angina, class I ([place]) 04/22/2015   Atherosclerotic heart disease native coronary artery w/angina pectoris ([place]) 1992   a. 1992 s/p PTCA of LAD;  b. 1998 CABG x 3; c. 07/2001 Cath: Sev LM/LAD/LCX dzs, 3/3 patent grafts; d. 11/2013 Cath: stable graft anatomy; e. 10/2016 Cath: native 3VD, VG->OM nl, LIMA->LAD nl, VG->Diag 55.   Atherosclerotic heart disease of native coronary artery with angina pectoris ([place]) 01/14/2010   Qualifier: Diagnosis of  By: Deatra Ina MD, Sandy Salaam    Atrial septal aneurysm    Barrett's esophagus 03/04/2010   Qualifier: Diagnosis of  By: Shane Crutch, Amy S    Bilateral leg edema 04/22/2015   Bradycardia, mild briefly to the 40s, asymptomatic 05/16/2013   Carotid arterial disease ([place])    a. 05/2014 Carotid U/S: No signif bilat ICA stenosis, >60% L ECA.   Chronic anemia    Chronic diastolic CHF (congestive heart failure) ([place])    a. 10/2016 Echo: Ef 55-60%, no rwma, Gr1 DD, triv AI, PASP  38mHg, atrial septal aneurysm.   Chronic diastolic CHF (congestive heart failure), NYHA class 2 ([place] 08/11/2017   Claudication ([place] 05/16/2013   Peripheral arterial occlusive disease with lifestyle limiting claudication    Degenerative joint disease 05/30/2018   DIVERTICULOSIS OF COLON 01/14/2010   Qualifier: Diagnosis of  By: KDeatra InaMD, RSandy Salaam   DM (diabetes mellitus), type 2 with peripheral vascular complications ([place]    On Oral medications.   DOE (dyspnea on exertion) 04/22/2015   Essential hypertension    FECAL INCONTINENCE 03/04/2010   Qualifier: Diagnosis of  By: ETrellis PaganiniPA-c, Amy S    GERD (gastroesophageal reflux disease)    Hyperlipidemia with target LDL less than 70    Inflammatory arthritis 10/13/2017   Neuropathy    Non-insulin dependent type 2 diabetes mellitus ([place] 10/24/2015   NSTEMI (non-ST elevated myocardial infarction) ([place] 11/01/2016   PAD (peripheral artery disease) ([place] 05/2013; 09/2013   a. severe calcified POP-1 & TP Trunk Dz bilat;  b . 05/2013 Diamondback Rot Athrectomy (R POP) --> PTA R Pop, DES to R Peroneal;  c. 09/2013 PTA/Stenting of L Pop ([name]- retrograde access);  d. 04/2014 ABI's: R/L: 1.1/1.1.   Preoperative cardiovascular examination 04/15/2020   PUD (peptic ulcer disease)    Rheumatoid arthritis ([place]    S/P CABG x 3 1998   LIMA-LAD, SVG-OM, SVG-D1   Severe claudication ([place] 05/2013   Referred for Peripheral Angio (Dr. BGwenlyn Found--> Dr. GBrunetta Jeansin CBoardman   SAss[place]02/01/2010   Qualifier: Diagnosis of  By: NHarlon DittyCMA (AAMA), [name]     Status post total replacement of right hip 10/12/2018   Vertigo 01/02/2014    Family History  Adopted: Yes  Problem Relation Age of Onset   Hypertension Mother    Hyperlipidemia Mother    Hyperlipidemia Father    Hypertension Father     Past Surgical History:  Procedure Laterality Date   ATHERECTOMY Right 05/15/2013   Procedure: ATHERECTOMY;  Surgeon: JLorretta Harp MD;   Location:  [facility] CATH [facility];  Service: Cardiovascular;  Laterality: Right;  popliteal   BACK SURGERY     CARDIAC CATHETERIZATION  07/17/01   2 v CAD with LM, circ, LAD, obtuse diag, mod stenosis of diag vein graft , mild stenosis of marg vein graft, nl EF, medical treatment   CARDIAC CATHETERIZATION  11/2013   Widely patent LIMA-LAD & SVG-OM with mild progression of proximal stenosis ~40-50% in SVG-D1; Widely patent native RCA with known severe native LCA disease   CARDIAC CATHETERIZATION N/A 11/01/2016   Procedure: Left Heart Cath and Cors/Grafts Angiography;  Surgeon: [name], MD;  Location: [facility] CV LAB;  Service: Cardiovascular: Hemodynamics: High LVEDP!.  Cors/Grafts: LM 55%, o-p LAD 100% then after D1 -> LIMA-LAD patent, SVG-Diag ~55%. small RI wiht ~70% ostial. O-p Cx 80% - pCx 70% --> SVG-bifurcating OM patent. -- med Rx   CORONARY ANGIOPLASTY  1992   PCI to LAD   CORONARY ARTERY BYPASS GRAFT  1998   LIMA-LAD, SVG-diagonal (none roughly 50% stenosis), SVG-OM   LEFT HEART CATHETERIZATION WITH CORONARY ANGIOGRAM N/A 11/27/2013   Procedure: LEFT HEART CATHETERIZATION WITH CORONARY ANGIOGRAM;  Surgeon: [name], MD;  Location[facility] CATH LAB;  Service: Cardiovascular;  Laterality: N/A;   LOW EXTREMITY DOPPLERS/ABI  10/2016   Patent right SFA, popliteal and peroneal tendons. Patent left SFA and popliteal stent. 30-50% stenosis in the right common femoral and popliteal arteries, 50-74% stenosis in left profunda femoris artery. --> 1 year follow-up.  RABI - 1.2, LABI 1.1   LOWER EXTREMITY ANGIOGRAM N/A 02/22/2013   Procedure: LOWER EXTREMITY ANGIOGRAM;  Surgeon: Lorretta Harp, MD;  Location: [facility] CATH LAB;  Service: Cardiovascular;  Laterality: N/A;   LOWER EXTREMITY ANGIOGRAM N/A 07/20/2013   Procedure: LOWER EXTREMITY ANGIOGRAM;  Surgeon: Lorretta Harp, MD;  Location: [facility] CATH LAB;  Service: Cardiovascular;  Laterality: N/A;   NM MYOVIEW LTD  04/03/2013; 5/26'/2016   Lexiscan: a)   Apical perfusion defect with no ischemia. Consider prior infarct versus breast attenuation.;; b) 5/'16: LOW RISK, Nl EF ~55-65%, No Ischemia /Infarction   NM MYOVIEW LTD  10/'19; 4/'21   a) LOW RISK.  EF 55%.  No ischemia or infarction. b) EF estimated 81%.  Suboptimal images.  May be subtle inferior ischemia-initially read by radiology as intermediate risk, however overread by cardiology to be mild low risk.Marland Kitchen   PERCUTANEOUS STENT INTERVENTION Right 05/15/2013   Procedure: PERCUTANEOUS STENT INTERVENTION;  Surgeon: Lorretta Harp, MD;  Location: [facility] CATH LAB;  Service: Cardiovascular;  Laterality: Right;  tibioperoneal trunk   Peroneal Artery Stent  05/15/13   Diamondback rot. atherectomy of high grade segmental popliteal stenosis then [place] balloonPTA; then angiosculpt PTA of the prox peroneal with stent-Xpedition    pv angio  07/20/13   high-grade calcified popliteal disease with one-vessel runoff via a small anterior tibial artery that has proximal disease as well, no intervention   TOTAL HIP ARTHROPLASTY Right 10/12/2018   Procedure: RIGHT TOTAL HIP ARTHROPLASTY ANTERIOR APPROACH;  Surgeon: Mcarthur Rossetti, MD;  Location: [facility] ORS;  Service: Orthopedics;  Laterality: Right;   TOTAL HIP ARTHROPLASTY Left 11/21/2020   Procedure: LEFT TOTAL HIP ARTHROPLASTY ANTERIOR APPROACH;  Surgeon: Mcarthur Rossetti, MD;  Location: [facility] ORS;  Service: Orthopedics;  Laterality: Left;   TRANSTHORACIC ECHOCARDIOGRAM  11/'17; 10/19   a) EF 55-60%. Gr 1 DD. Normal PAP. Aortic Sclerosis.;; b) Normal LV size and function with vigorous EF 65 to 70%.  GR 1 DD.  Severe LA dilation (suggestive of probably worsening: GR 1 DD).   TRANSTHORACIC ECHOCARDIOGRAM  03/18/2020   EF 70-75%.  Hyperdynamic.  GR 1 DD.  No R WMA.  Mild LA dilation.  Mild aortic valve sclerosis but no stenosis.-No change from prior echo   TUBAL LIGATION     Social History   Occupational History   Occupation: retired  Tobacco Use    Smoking status: Never   Smokeless tobacco: Never  Vaping Use   Vaping Use: Never used  Substance and Sexual Activity   Alcohol use: Not Currently   Drug use: No   Sexual activity: Not on file

## 2022-12-05 ENCOUNTER — Other Ambulatory Visit: Payer: Self-pay | Admitting: Cardiovascular Disease

## 2022-12-10 ENCOUNTER — Ambulatory Visit: Payer: Medicare Other | Admitting: Physician Assistant

## 2023-02-01 NOTE — Progress Notes (Signed)
Error

## 2023-03-30 ENCOUNTER — Other Ambulatory Visit: Payer: Self-pay | Admitting: Physician Assistant

## 2023-04-13 ENCOUNTER — Ambulatory Visit: Payer: Medicare Other | Admitting: Cardiovascular Disease

## 2023-04-13 ENCOUNTER — Ambulatory Visit: Payer: Medicare Other | Attending: Cardiovascular Disease | Admitting: Cardiovascular Disease

## 2023-04-13 ENCOUNTER — Encounter: Payer: Self-pay | Admitting: Cardiovascular Disease

## 2023-04-13 VITALS — BP 155/80 | HR 61 | Ht 63.0 in | Wt 156.0 lb

## 2023-04-13 DIAGNOSIS — I739 Peripheral vascular disease, unspecified: Secondary | ICD-10-CM

## 2023-04-13 DIAGNOSIS — I10 Essential (primary) hypertension: Secondary | ICD-10-CM | POA: Diagnosis not present

## 2023-04-13 DIAGNOSIS — R600 Localized edema: Secondary | ICD-10-CM

## 2023-04-13 DIAGNOSIS — E785 Hyperlipidemia, unspecified: Secondary | ICD-10-CM | POA: Diagnosis not present

## 2023-04-13 DIAGNOSIS — I5032 Chronic diastolic (congestive) heart failure: Secondary | ICD-10-CM | POA: Diagnosis not present

## 2023-04-13 DIAGNOSIS — Z79899 Other long term (current) drug therapy: Secondary | ICD-10-CM

## 2023-04-13 NOTE — Assessment & Plan Note (Signed)
History of hyperlipidemia on Zetia and high-dose rosuvastatin with lipid profile performed 09/06/2022 revealing total cholesterol 143, LDL 89 HDL 56.

## 2023-04-13 NOTE — Progress Notes (Signed)
04/13/2023 JOHNYE KIST   30-Jan-1933  [id number]  Primary Physician Irena Reichmann, DO Primary Cardiologist: Runell Gess MD Nicholes Calamity, [place]  HPI:  [name] is a 89 y.o.  married African American female patient of Dr. Onalee Hua Harding's and Dr. Gabriel Rung. I last saw her in the office 03/02/2022.Marland KitchenShe is accompanied by her daughter Marti Sleigh today.  She has known CAD status post coronary bypass grafting in the past (1998). She has preserved LV function and nonischemic Myoview. Because of claudication she underwent peripheral vascular angiography and other Bryan Lemma 02/22/13 revealing significant popliteal and infrapopliteal disease. She has lifestyle limiting claudication.on 05/16/13 she underwent diamondback orbital rotational atherectomy, PTA of her distal right SFA and stenting using coronary drug-eluting stent for peroneal artery which was her only patent artery on the right. She does not notice any improvement and claudication since her procedure although she says her right leg his less "heavy" and that her left leg is much more symptomatic.I restudied her 07/20/13 with the intention of fixing her left leg however I decided to defer this because of anatomic complexity. Ultimately I  referred her to Dr. Hoy Finlay in [place] who performed intervention on her left popliteal and tibioperoneal trunk on 10/01/13. Followup Dopplers performed 04/29/14 were excellent with ABIs of greater than 1 bilaterally, patent peroneal stent on the right a patent left popliteal. Subsequent Dopplers performed in December showed a right ABI of 0.81 and a left ABI 0.91 with patent stents   She had done well after intervention and actually said that she was able to ambulate with claudication. Her last Doppler studies performed 11/10/16 revealed normal ABIs bilaterally with patent SFAs and infrapopliteal vessels. She has noticed some superficial hypersensitivity on her left pretibial area that had venous Doppler  studies performed recently that showed no evidence of DVT.    She did have a right total hip replacement 11/19 which she is still recovering from.  Recent lower extremity arterial Doppler studies performed 06/21/2019 revealed a decline in her right ABI 0..76 with a high-frequency signal in her right popliteal artery and monophasic waveforms below that.  She complains of pain in both legs from the knee down left greater than right at rest and with exertion despite normal left ABIs and a palpable pedal pulse on that side.    She apparently needs an elective left total knee replacement.  She has difficulty ambulating mostly because of pain in her left leg.  She also has chronic lower extremity edema left greater than right on low-dose furosemide which we are going to increase from 20 to 40 mg a day.  She takes occasional sublingual nitroglycerin every other week for chest pain.  She did have a nonischemic Myoview for preoperative clearance before her right total hip replacement 09/15/2018.   She was admitted to the hospital on 03/18/2020 with chest pain.  She ruled out for myocardial infarction.  A Myoview stress test was read as low risk with mild inferior ischemia.  She also had lower extremity arterial Doppler studies performed in our office 03/31/2020 which revealed a right ABI of 0.39 with an occluded right popliteal artery and a left of 1.12.  I cleared her for her left total hip replacement low risk given her recent Myoview stress test results.  She does not really complain of lifestyle limiting claudication on the right.   Unfortunately her left total hip replacement was delayed because of COVID-19 and i that was successfully performed in  December of last year..  She had a low risk Myoview stress test.  She had a right total knee replacement back in 2019.  She denies chest pain or shortness of breath.  Since I saw her a year ago she has been noticing some increasing lower extremity edema over the last  several weeks with some bullae formation.  She has had occasional nitro responsive chest pain.  She denies increased shortness of breath.  Current Meds  Medication Sig   acetaminophen (TYLENOL) 650 MG CR tablet Take 650 mg by mouth every 8 (eight) hours as needed for pain.   acetaminophen-codeine (TYLENOL #3) 300-30 MG tablet Take 1-2 tablets by mouth every 8 (eight) hours as needed.   alendronate (FOSAMAX) 70 MG tablet Take 70 mg by mouth once a week.   carvedilol (COREG) 6.25 MG tablet TAKE 1 TABLET BY MOUTH TWICE A DAY   Cholecalciferol (VITAMIN D3) 10 MCG (400 UNIT) tablet Take 400 Units by mouth daily.   clopidogrel (PLAVIX) 75 MG tablet Take 75 mg by mouth daily.   Coenzyme Q10 (CO Q 10 PO) Take 1 tablet by mouth every other day.    ezetimibe (ZETIA) 10 MG tablet Take 10 mg by mouth at bedtime.   folic acid (FOLVITE) 1 MG tablet Take 1 mg by mouth daily.   furosemide (LASIX) 20 MG tablet Take 20 mg by mouth daily as needed.   gabapentin (NEURONTIN) 400 MG capsule Take 400 mg by mouth 2 (two) times daily.   glimepiride (AMARYL) 2 MG tablet Take 2 mg by mouth daily as needed.   hydrALAZINE (APRESOLINE) 50 MG tablet Take 1 tablet (50 mg total) by mouth every 8 (eight) hours.   isosorbide mononitrate (IMDUR) 60 MG 24 hr tablet TAKE 1.5 TABLETS (90 MG TOTAL) BY MOUTH DAILY. . (Patient taking differently: Take 60 mg by mouth daily. Marland Kitchen)   LORazepam (ATIVAN) 0.5 MG tablet Take 0.5 mg by mouth daily as needed.   methotrexate (RHEUMATREX) 2.5 MG tablet Take 20 mg by mouth every Sunday. (8 tablets)   nitroGLYCERIN (NITROSTAT) 0.4 MG SL tablet PLACE 1 TABLET (0.4 MG TOTAL) UNDER THE TONGUE EVERY 5 (FIVE) MINUTES AS NEEDED FOR CHEST PAIN.   pantoprazole (PROTONIX) 40 MG tablet Take 1 tablet (40 mg total) by mouth 2 (two) times daily before a meal.   predniSONE (DELTASONE) 5 MG tablet Take 5 mg by mouth daily with breakfast.    rosuvastatin (CRESTOR) 40 MG tablet TAKE 1 TABLET BY MOUTH EVERYDAY AT  BEDTIME   sulfaSALAzine (AZULFIDINE) 500 MG EC tablet Take 500 mg by mouth 2 (two) times daily.     Allergies  Allergen Reactions   Fish Allergy Hives and Shortness Of Breath   Iodinated Contrast Media Shortness Of Breath and Anaphylaxis    Other reaction(s): Unknown   Latex Anaphylaxis, Hives, Shortness Of Breath, Itching and Other (See Comments)    REACTION: wheezing Other reaction(s): Unknown   Shellfish Allergy Anaphylaxis, Hives, Shortness Of Breath and Other (See Comments)    All seafood   Amlodipine Swelling   Evolocumab     Other reaction(s): latex on syringe   Other Itching and Other (See Comments)    Patient reports allergy to perfumed detergents. Causes itching.   Metformin Nausea Only and Nausea And Vomiting    Other reaction(s): Unknown    Social History   Socioeconomic History   Marital status: Married    Spouse name: Not on file   Number of children: Not on  file   Years of education: Not on file   Highest education level: Not on file  Occupational History   Occupation: retired  Tobacco Use   Smoking status: Never   Smokeless tobacco: Never  Vaping Use   Vaping Use: Never used  Substance and Sexual Activity   Alcohol use: Not Currently   Drug use: No   Sexual activity: Not on file  Other Topics Concern   Not on file  Social History Narrative   Right handed   Lives alone ( husband in rehab facility)   Caffeine- mostly coffee       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   Social Determinants of Health   Financial Resource Strain: Not on file  Food Insecurity: No Food Insecurity (10/20/2022)   Hunger Vital Sign    Worried About Running Out of Food in the Last Year: Never true    Ran Out of Food in the Last Year: Never true  Transportation Needs: No Transportation Needs (10/20/2022)   PRAPARE - Administrator, Civil Service (Medical): No    Lack of Transportation (Non-Medical): No  Physical Activity: Not on file  Stress: Not on file  Social Connections: Not  on file  Intimate Partner Violence: Not At Risk (10/20/2022)   Humiliation, Afraid, Rape, and Kick questionnaire    Fear of Current or Ex-Partner: No    Emotionally Abused: No    Physically Abused: No    Sexually Abused: No     Review of Systems: General: negative for chills, fever, night sweats or weight changes.  Cardiovascular: negative for chest pain, dyspnea on exertion, edema, orthopnea, palpitations, paroxysmal nocturnal dyspnea or shortness of breath Dermatological: negative for rash Respiratory: negative for cough or wheezing Urologic: negative for hematuria Abdominal: negative for nausea, vomiting, diarrhea, bright red blood per rectum, melena, or hematemesis Neurologic: negative for visual changes, syncope, or dizziness All other systems reviewed and are otherwise negative except as noted above.    Blood pressure (!) 155/80, pulse 61, height 5\' 3"  (1.6 m), weight 156 lb (70.8 kg).  General appearance: alert and no distress Neck: no adenopathy, no carotid bruit, no JVD, supple, symmetrical, trachea midline, and thyroid not enlarged, symmetric, no tenderness/mass/nodules Lungs: clear to auscultation bilaterally Heart: regular rate and rhythm, S1, S2 normal, no murmur, click, rub or gallop Extremities: 1-2+ bilateral lower extremity edema with bullae formation. Pulses: Diminished right pedal pulse Skin: Skin color, texture, turgor normal. No rashes or lesions Neurologic: Grossly normal  EKG sinus rhythm at 61 with nonspecific ST and T wave changes.  Personally reviewed this EKG.  ASSESSMENT AND PLAN:   PAD,severe calcified pop and tibial peroneal trunk disease bilateraly History of PAD status post right SFA intervention by myself 05/16/2013 with orbital atherectomy, PTA and SFAs stenting as well as peroneal stenting.  She underwent left SFA intervention by Dr. Pernell Dupre 10/01/2013.  Since that time she has had Doppler studies performed 03/31/2020 revealing right ABI of 0.39 and a  left of 1.12.  Her right popliteal artery appears to be occluded.  She denies significant claudication.  Dyslipidemia, goal LDL below 70 History of hyperlipidemia on Zetia and high-dose rosuvastatin with lipid profile performed 09/06/2022 revealing total cholesterol 143, LDL 89 HDL 56.  Essential hypertension History of essential hypertension blood pressure measured today at 155/80.  She is on carvedilol, and hydralazine.  Bilateral leg edema History of bilateral lower extremity edema currently on furosemide 20 mg a day.  Her edema has gotten worse over  the last several weeks and she does have some bullae there as well.  I am going to increase her furosemide to 40 mg a day for 5 days and then back back down to 20 mg a day.  Will check a basic metabolic panel next week and she will see an APP back in 2 weeks for follow-up.  Chronic diastolic CHF (congestive heart failure), NYHA class 2 (HCC) 2D echo performed 03/18/2020 showed normal LV systolic function with grade 1 diastolic dysfunction on low-dose furosemide.  CAD (coronary artery disease) The status post CABG in 1998 with multiple negative Myoview stress test since that time most recently 02/08/2022.  She has occasional nitrate responsive chest pain.     Runell Gess MD FACP,FACC,FAHA, [facility] 04/13/2023 2:32 PM

## 2023-04-13 NOTE — Assessment & Plan Note (Signed)
History of bilateral lower extremity edema currently on furosemide 20 mg a day.  Her edema has gotten worse over the last several weeks and she does have some bullae there as well.  I am going to increase her furosemide to 40 mg a day for 5 days and then back back down to 20 mg a day.  Will check a basic metabolic panel next week and she will see an APP back in 2 weeks for follow-up.

## 2023-04-13 NOTE — Assessment & Plan Note (Signed)
The status post CABG in 1998 with multiple negative Myoview stress test since that time most recently 02/08/2022.  She has occasional nitrate responsive chest pain.

## 2023-04-13 NOTE — Assessment & Plan Note (Signed)
History of essential hypertension blood pressure measured today at 155/80.  She is on carvedilol, and hydralazine.

## 2023-04-13 NOTE — Patient Instructions (Signed)
Medication Instructions:   INCREASE Lasix to 40 mg daily for 5 days then resume 20 mg daily on day 6 forward  *If you need a refill on your cardiac medications before your next appointment, please call your pharmacy*  Lab Work: Your physician recommends that you return for lab work in 1 week:  BMP  If you have labs (blood work) drawn today and your tests are completely normal, you will receive your results only by: MyChart Message (if you have MyChart) OR A paper copy in the mail If you have any lab test that is abnormal or we need to change your treatment, we will call you to review the results.  Testing/Procedures: Your physician has requested that you have an echocardiogram. Echocardiography is a painless test that uses sound waves to create images of your heart. It provides your doctor with information about the size and shape of your heart and how well your heart's chambers and valves are working. This procedure takes approximately one hour. There are no restrictions for this procedure. Please do NOT wear cologne, perfume, aftershave, or lotions (deodorant is allowed). Please arrive 15 minutes prior to your appointment time.   Follow-Up: At [facility], you and your health needs are our priority.  As part of our continuing mission to provide you with exceptional heart care, we have created designated Provider Care Teams.  These Care Teams include your primary Cardiologist (physician) and Advanced Practice Providers (APPs -  Physician Assistants and Nurse Practitioners) who all work together to provide you with the care you need, when you need it.  We recommend signing up for the patient portal called "MyChart".  Sign up information is provided on this After Visit Summary.  MyChart is used to connect with patients for Virtual Visits (Telemedicine).  Patients are able to view lab/test results, encounter notes, upcoming appointments, etc.  Non-urgent messages can be sent to your  provider as well.   To learn more about what you can do with MyChart, go to [email or web address].    Your next appointment:   2-3 week(s)  Provider:   APP    Then, [name], MD will plan to see you again in 6 month(s).    Other Instructions

## 2023-04-13 NOTE — Assessment & Plan Note (Signed)
History of PAD status post right SFA intervention by myself 05/16/2013 with orbital atherectomy, PTA and SFAs stenting as well as peroneal stenting.  She underwent left SFA intervention by Dr. Pernell Dupre 10/01/2013.  Since that time she has had Doppler studies performed 03/31/2020 revealing right ABI of 0.39 and a left of 1.12.  Her right popliteal artery appears to be occluded.  She denies significant claudication.

## 2023-04-13 NOTE — Assessment & Plan Note (Signed)
2D echo performed 03/18/2020 showed normal LV systolic function with grade 1 diastolic dysfunction on low-dose furosemide.

## 2023-04-14 ENCOUNTER — Encounter (HOSPITAL_COMMUNITY): Payer: Self-pay | Admitting: Emergency Medicine

## 2023-04-14 ENCOUNTER — Ambulatory Visit (HOSPITAL_COMMUNITY): Payer: Medicare Other | Attending: Cardiovascular Disease

## 2023-04-14 ENCOUNTER — Ambulatory Visit (HOSPITAL_COMMUNITY)
Admission: EM | Admit: 2023-04-14 | Discharge: 2023-04-14 | Disposition: A | Discharge status: Home / Self Care | Payer: Medicare Other | Attending: Internal Medicine | Admitting: Internal Medicine

## 2023-04-14 DIAGNOSIS — L03116 Cellulitis of left lower limb: Secondary | ICD-10-CM

## 2023-04-14 DIAGNOSIS — I5032 Chronic diastolic (congestive) heart failure: Secondary | ICD-10-CM | POA: Insufficient documentation

## 2023-04-14 DIAGNOSIS — L03115 Cellulitis of right lower limb: Secondary | ICD-10-CM | POA: Diagnosis not present

## 2023-04-14 LAB — ECHOCARDIOGRAM COMPLETE
Area-P 1/2: 3.05 cm2
S' Lateral: 2.2 cm

## 2023-04-14 MED ORDER — CEPHALEXIN 500 MG PO CAPS: 500.0000 mg | ORAL_CAPSULE | Freq: Three times a day (TID) | ORAL | 0 refills | Status: AC

## 2023-04-14 NOTE — Discharge Instructions (Addendum)
Please continue elevating your legs You will need daily wound dressing changes with antibiotic ointment. Please do not use peroxide on your wounds. Take antibiotics as prescribed Please complete the course of antibiotics Please continue Lasix as recommended by your cardiologist Please call the wound care center and schedule an appointment to be seen sometime next week If you have any further concerns, feel free to return to urgent care to be reevaluated.

## 2023-04-14 NOTE — ED Triage Notes (Signed)
Pt c/o sores on bilateral lower legs for a couple weeks. One on right lower medial leg is weeping.  Wanting "professional to wrap my legs".

## 2023-04-14 NOTE — ED Provider Notes (Signed)
[facility]    CSN: [id number] Arrival date & time: 04/14/23  1919      History   Chief Complaint Chief Complaint  Patient presents with   Skin Problem    HPI [name] is a 89 y.o. female with a history of diabetes mellitus type 2, who chronic diastolic heart failure comes to urgent care with 2-week history of cough lower extremity edema and painful blisters of the legs.  Patient says the blisters have worsened over the past couple of weeks and has become more painful.  Her legs have become red and painful.  No fever or chills.  Patient's cardiologist recently increased her dose of Lasix to 40 mg orally daily.  She is concerned that her blisters from becoming infected.  Patient has been doing dressing changes with Neosporin with no improvement in the blisters.    Marland Kitchen   HPI  Past Medical History:  Diagnosis Date   Allergy to IVP dye    Angina, class I (HCC) 04/22/2015   Atherosclerotic heart disease native coronary artery w/angina pectoris (HCC) 1992   a. 1992 s/p PTCA of LAD;  b. 1998 CABG x 3; c. 07/2001 Cath: Sev LM/LAD/LCX dzs, 3/3 patent grafts; d. 11/2013 Cath: stable graft anatomy; e. 10/2016 Cath: native 3VD, VG->OM nl, LIMA->LAD nl, VG->Diag 55.   Atherosclerotic heart disease of native coronary artery with angina pectoris (HCC) 01/14/2010   Qualifier: Diagnosis of  By: Arlyce Dice MD, Barbette Hair    Atrial septal aneurysm    Barrett's esophagus 03/04/2010   Qualifier: Diagnosis of  By: Myrtie Hawk, Amy S    Bilateral leg edema 04/22/2015   Bradycardia, mild briefly to the 40s, asymptomatic 05/16/2013   Carotid arterial disease (HCC)    a. 05/2014 Carotid U/S: No signif bilat ICA stenosis, >60% L ECA.   Chronic anemia    Chronic diastolic CHF (congestive heart failure) (HCC)    a. 10/2016 Echo: Ef 55-60%, no rwma, Gr1 DD, triv AI, PASP , atrial septal aneurysm.   Chronic diastolic CHF (congestive heart failure), NYHA class 2 (HCC) 08/11/2017    Claudication (HCC) 05/16/2013   Peripheral arterial occlusive disease with lifestyle limiting claudication    Degenerative joint disease 05/30/2018   DIVERTICULOSIS OF COLON 01/14/2010   Qualifier: Diagnosis of  By: Arlyce Dice MD, Barbette Hair    DM (diabetes mellitus), type 2 with peripheral vascular complications (HCC)    On Oral medications.   DOE (dyspnea on exertion) 04/22/2015   Essential hypertension    FECAL INCONTINENCE 03/04/2010   Qualifier: Diagnosis of  By: Monica Becton PA-c, Amy S    GERD (gastroesophageal reflux disease)    Hyperlipidemia with target LDL less than 70    Inflammatory arthritis 10/13/2017   Neuropathy    Non-insulin dependent type 2 diabetes mellitus (HCC) 10/24/2015   NSTEMI (non-ST elevated myocardial infarction) (HCC) 11/01/2016   PAD (peripheral artery disease) (HCC) 05/2013; 09/2013   a. severe calcified POP-1 & TP Trunk Dz bilat;  b . 05/2013 Diamondback Rot Athrectomy (R POP) --> PTA R Pop, DES to R Peroneal;  c. 09/2013 PTA/Stenting of L Pop Clemencia Course - retrograde access);  d. 04/2014 ABI's: R/L: 1.1/1.1.   Preoperative cardiovascular examination 04/15/2020   PUD (peptic ulcer disease)    Rheumatoid arthritis (HCC)    S/P CABG x 3 1998   LIMA-LAD, SVG-OM, SVG-D1   Severe claudication (HCC) 05/2013   Referred for Peripheral Angio (Dr. Allyson Sabal --> Dr. Hoy Finlay in [place])  SHELLFISH ALLERGY 01/14/2010   Qualifier: Diagnosis of  By: Nelson-Smith CMA (AAMA), [name]     Status post total replacement of right hip 10/12/2018   Vertigo 01/02/2014    Patient Active Problem List   Diagnosis Date Noted   Pain in right hip 11/29/2022   Right arm pain 11/29/2022   Hypertensive urgency 10/20/2022   Nausea and vomiting 10/20/2022   Anxiety 11/04/2021   Avascular necrosis (HCC) 11/04/2021   Chronic interstitial cystitis 11/04/2021   Diabetic peripheral neuropathy associated with type 2 diabetes mellitus (HCC) 11/04/2021   Polyneuropathy due to type 2 diabetes  mellitus (HCC) 11/04/2021   Eczema 11/04/2021   History of urinary tract infection 11/04/2021   Neuropathy 11/04/2021   Other long term (current) drug therapy 11/04/2021   Peptic ulcer disease 11/04/2021   Presence of aortocoronary bypass graft 11/04/2021   Skin sensation disturbance 11/04/2021   Vitamin D deficiency 11/04/2021   Gastroesophageal reflux disease 11/04/2021   Rheumatoid arthritis (HCC) 11/04/2021   Peripheral vascular disease (HCC) 11/04/2021   Type 2 diabetes mellitus with other specified complication (HCC) 11/04/2021   Chronic diastolic heart failure (HCC) 01/07/2021   Status post total hip replacement, right 12/04/2020   Status post total replacement of left hip 11/21/2020   Near syncope 07/10/2020   Hx of CABG 07/10/2020   Preoperative cardiovascular examination 04/15/2020   Chest pain 03/18/2020   Unilateral primary osteoarthritis, left hip 04/03/2019   History of right hip replacement 04/03/2019   Status post total replacement of right hip 10/12/2018   Unilateral primary osteoarthritis, right hip 09/12/2018   Degenerative joint disease 05/30/2018   Anal fissure 11/10/2017   Rectal bleeding 11/10/2017   Rectal pain 11/10/2017   Inflammatory arthritis 10/13/2017   Latent tuberculosis by blood test 10/13/2017   Chronic diastolic CHF (congestive heart failure), NYHA class 2 (HCC) 08/11/2017   Fatigue due to treatment 01/09/2017   NSTEMI (non-ST elevated myocardial infarction) (HCC) 11/01/2016   Diabetes mellitus (HCC) 10/24/2015   Non-insulin dependent type 2 diabetes mellitus (HCC) 10/24/2015   Angina, class I (HCC) 04/22/2015   DOE (dyspnea on exertion) 04/22/2015   Bilateral leg edema 04/22/2015   Accumulation of fluid in tissues 03/21/2014   Vertigo, central 01/02/2014   Vertigo 01/02/2014   Limb pain- echymosis, pain Lt radial artery after cath 12/11/2013   Dyslipidemia, goal LDL below 70 05/17/2013   Essential hypertension 05/17/2013   Claudication  (HCC) 05/16/2013   PTA/ Stent Rt popliteal and Rt peroneal artery 05/16/13 05/16/2013   PAD,severe calcified pop and tibial peroneal trunk disease bilateraly    ABDOMINAL PAIN RIGHT LOWER QUADRANT 10/07/2010   CVA-STROKE 03/04/2010   BARRETT'S ESOPHAGUS 03/04/2010   FECAL INCONTINENCE 03/04/2010   DM 01/14/2010   Anemia of chronic disease 01/14/2010   CAD (coronary artery disease) 01/14/2010   DIVERTICULOSIS OF COLON 01/14/2010   PERSONAL HISTORY OF COLONIC POLYPS 01/14/2010   SHELLFISH ALLERGY 01/14/2010    Past Surgical History:  Procedure Laterality Date   ATHERECTOMY Right 05/15/2013   Procedure: ATHERECTOMY;  Surgeon: Runell Gess, MD;  Location: [facility] CATH LAB;  Service: Cardiovascular;  Laterality: Right;  popliteal   BACK SURGERY     CARDIAC CATHETERIZATION  07/17/01   2 v CAD with LM, circ, LAD, obtuse diag, mod stenosis of diag vein graft , mild stenosis of marg vein graft, nl EF, medical treatment   CARDIAC CATHETERIZATION  11/2013   Widely patent LIMA-LAD & SVG-OM with mild progression of proximal stenosis ~40-50%  in SVG-D1; Widely patent native RCA with known severe native LCA disease   CARDIAC CATHETERIZATION N/A 11/01/2016   Procedure: Left Heart Cath and Cors/Grafts Angiography;  Surgeon: [name], MD;  Location: [facility] INVASIVE CV LAB;  Service: Cardiovascular: Hemodynamics: High LVEDP!.  Cors/Grafts: LM 55%, o-p LAD 100% then after D1 -> LIMA-LAD patent, SVG-Diag ~55%. small RI wiht ~70% ostial. O-p Cx 80% - pCx 70% --> SVG-bifurcating OM patent. -- med Rx   CORONARY ANGIOPLASTY  1992   PCI to LAD   CORONARY ARTERY BYPASS GRAFT  1998   LIMA-LAD, SVG-diagonal (none roughly 50% stenosis), SVG-OM   LEFT HEART CATHETERIZATION WITH CORONARY ANGIOGRAM N/A 11/27/2013   Procedure: LEFT HEART CATHETERIZATION WITH CORONARY ANGIOGRAM;  Surgeon: [name], MD;  Location: [facility] CATH LAB;  Service: Cardiovascular;  Laterality: N/A;   LOW EXTREMITY DOPPLERS/ABI  10/2016   Patent  right SFA, popliteal and peroneal tendons. Patent left SFA and popliteal stent. 30-50% stenosis in the right common femoral and popliteal arteries, 50-74% stenosis in left profunda femoris artery. --> 1 year follow-up.  RABI - 1.2, LABI 1.1   LOWER EXTREMITY ANGIOGRAM N/A 02/22/2013   Procedure: LOWER EXTREMITY ANGIOGRAM;  Surgeon: Runell Gess, MD;  Location: [facility] CATH LAB;  Service: Cardiovascular;  Laterality: N/A;   LOWER EXTREMITY ANGIOGRAM N/A 07/20/2013   Procedure: LOWER EXTREMITY ANGIOGRAM;  Surgeon: Runell Gess, MD;  Location: [facility] CATH LAB;  Service: Cardiovascular;  Laterality: N/A;   NM MYOVIEW LTD  04/03/2013; 5/26'/2016   Lexiscan: a)  Apical perfusion defect with no ischemia. Consider prior infarct versus breast attenuation.;; b) 5/'16: LOW RISK, Nl EF ~55-65%, No Ischemia /Infarction   NM MYOVIEW LTD  10/'19; 4/'21   a) LOW RISK.  EF 55%.  No ischemia or infarction. b) EF estimated 81%.  Suboptimal images.  May be subtle inferior ischemia-initially read by radiology as intermediate risk, however overread by cardiology to be mild low risk.Marland Kitchen   PERCUTANEOUS STENT INTERVENTION Right 05/15/2013   Procedure: PERCUTANEOUS STENT INTERVENTION;  Surgeon: Runell Gess, MD;  Location: [facility] CATH LAB;  Service: Cardiovascular;  Laterality: Right;  tibioperoneal trunk   Peroneal Artery Stent  05/15/13   Diamondback rot. atherectomy of high grade segmental popliteal stenosis then Chocolate balloonPTA; then angiosculpt PTA of the prox peroneal with stent-Xpedition    pv angio  07/20/13   high-grade calcified popliteal disease with one-vessel runoff via a small anterior tibial artery that has proximal disease as well, no intervention   TOTAL HIP ARTHROPLASTY Right 10/12/2018   Procedure: RIGHT TOTAL HIP ARTHROPLASTY ANTERIOR APPROACH;  Surgeon: Kathryne Hitch, MD;  Location: [facility] ORS;  Service: Orthopedics;  Laterality: Right;   TOTAL HIP ARTHROPLASTY Left 11/21/2020   Procedure: LEFT TOTAL HIP  ARTHROPLASTY ANTERIOR APPROACH;  Surgeon: Kathryne Hitch, MD;  Location: [facility] ORS;  Service: Orthopedics;  Laterality: Left;   TRANSTHORACIC ECHOCARDIOGRAM  11/'17; 10/19   a) EF 55-60%. Gr 1 DD. Normal PAP. Aortic Sclerosis.;; b) Normal LV size and function with vigorous EF 65 to 70%.  GR 1 DD.  Severe LA dilation (suggestive of probably worsening: GR 1 DD).   TRANSTHORACIC ECHOCARDIOGRAM  03/18/2020   EF 70-75%.  Hyperdynamic.  GR 1 DD.  No R WMA.  Mild LA dilation.  Mild aortic valve sclerosis but no stenosis.-No change from prior echo   TUBAL LIGATION      OB History   No obstetric history on file.      Home  Medications    Prior to Admission medications   Medication Sig Start Date End Date Taking? Authorizing Provider  cephALEXin (KEFLEX) 500 MG capsule Take 1 capsule (500 mg total) by mouth 3 (three) times daily for 7 days. 04/14/23 04/21/23 Yes Dianelly Ferran, Britta Mccreedy, MD  acetaminophen (TYLENOL) 650 MG CR tablet Take 650 mg by mouth every 8 (eight) hours as needed for pain.    [provider]  acetaminophen-codeine (TYLENOL #3) 300-30 MG tablet Take 1-2 tablets by mouth every 8 (eight) hours as needed. 03/20/21   Kathryne Hitch, MD  alendronate (FOSAMAX) 70 MG tablet Take 70 mg by mouth once a week.    [provider]  carvedilol (COREG) 6.25 MG tablet TAKE 1 TABLET BY MOUTH TWICE A DAY 12/07/22   Runell Gess, MD  Cholecalciferol (VITAMIN D3) 10 MCG (400 UNIT) tablet Take 400 Units by mouth daily.    [provider]  clopidogrel (PLAVIX) 75 MG tablet Take 75 mg by mouth daily.    [provider]  Coenzyme Q10 (CO Q 10 PO) Take 1 tablet by mouth every other day.     [provider]  ezetimibe (ZETIA) 10 MG tablet Take 10 mg by mouth at bedtime.    [provider]  folic acid (FOLVITE) 1 MG tablet Take 1 mg by mouth daily. 08/21/18   [provider]  furosemide (LASIX) 20 MG tablet Take 20 mg by mouth daily as  needed. 11/29/22   [provider]  gabapentin (NEURONTIN) 400 MG capsule Take 400 mg by mouth 2 (two) times daily. 09/07/22   [provider]  glimepiride (AMARYL) 2 MG tablet Take 2 mg by mouth daily as needed. 01/07/22   [provider]  hydrALAZINE (APRESOLINE) 50 MG tablet Take 1 tablet (50 mg total) by mouth every 8 (eight) hours. 10/23/22   Willeen Niece, MD  isosorbide mononitrate (IMDUR) 60 MG 24 hr tablet TAKE 1.5 TABLETS (90 MG TOTAL) BY MOUTH DAILY. Marland Kitchen Patient taking differently: Take 60 mg by mouth daily. . 04/12/22   Runell Gess, MD  LORazepam (ATIVAN) 0.5 MG tablet Take 0.5 mg by mouth daily as needed. 12/04/21   [provider]  methotrexate (RHEUMATREX) 2.5 MG tablet Take 20 mg by mouth every Sunday. (8 tablets) 08/21/18   [provider]  nitroGLYCERIN (NITROSTAT) 0.4 MG SL tablet PLACE 1 TABLET (0.4 MG TOTAL) UNDER THE TONGUE EVERY 5 (FIVE) MINUTES AS NEEDED FOR CHEST PAIN. 02/13/18   Creig Hines, NP  pantoprazole (PROTONIX) 40 MG tablet Take 1 tablet (40 mg total) by mouth 2 (two) times daily before a meal. 03/19/20   Rolly Salter, MD  predniSONE (DELTASONE) 5 MG tablet Take 5 mg by mouth daily with breakfast.     [provider]  rosuvastatin (CRESTOR) 40 MG tablet TAKE 1 TABLET BY MOUTH EVERYDAY AT BEDTIME 01/16/21   Runell Gess, MD  sulfaSALAzine (AZULFIDINE) 500 MG EC tablet Take 500 mg by mouth 2 (two) times daily.    [provider]    Family History Family History  Adopted: Yes  Problem Relation Age of Onset   Hypertension Mother    Hyperlipidemia Mother    Hyperlipidemia Father    Hypertension Father     Social History Social History   Tobacco Use   Smoking status: Never   Smokeless tobacco: Never  Vaping Use   Vaping Use: Never used  Substance Use Topics   Alcohol use: Not  Currently   Drug use: No     Allergies   Fish allergy, Iodinated contrast media, Latex, Shellfish  allergy, Amlodipine, Evolocumab, Other, and Metformin   Review of Systems Review of Systems As per HPI  Physical Exam Triage Vital Signs ED Triage Vitals  Enc Vitals Group     BP 04/14/23 1942 (!) 179/88     Pulse Rate 04/14/23 1942 72     Resp 04/14/23 1942 19     Temp 04/14/23 1942 97.7 F (36.5 C)     Temp Source 04/14/23 1942 Oral     SpO2 04/14/23 1942 95 %     Weight --      Height --      Head Circumference --      Peak Flow --      Pain Score 04/14/23 1941 4     Pain Loc --      Pain Edu? --      Excl. in GC? --    No data found.  Updated Vital Signs BP (!) 179/88 (BP Location: Left Arm)   Pulse 72   Temp 97.7 F (36.5 C) (Oral)   Resp 19   SpO2 95%   Visual Acuity Right Eye Distance:   Left Eye Distance:   Bilateral Distance:    Right Eye Near:   Left Eye Near:    Bilateral Near:     Physical Exam Vitals and nursing note reviewed.  Cardiovascular:     Rate and Rhythm: Normal rate and regular rhythm.  Pulmonary:     Effort: Pulmonary effort is normal.     Breath sounds: Normal breath sounds.  Abdominal:     General: Bowel sounds are normal.     Palpations: Abdomen is soft.  Musculoskeletal:     Right lower leg: Edema present.     Left lower leg: Edema present.     Comments: Bilateral lower extremity weakness this provide cellulitic changes of both legs.  Bilateral pitting edema  Neurological:     Mental Status: She is alert.      UC Treatments / Results  Labs (all labs ordered are listed, but only abnormal results are displayed) Labs Reviewed - No data to display  EKG   Radiology   Procedures Procedures (including critical care time)  Medications Ordered in UC Medications - No data to display  Initial Impression / Assessment and Plan / UC Course  I have reviewed the triage vital signs and the nursing notes.  Pertinent labs & imaging results that were available during my care of the patient were reviewed by me and considered  in my medical decision making (see chart for details).     1.  Bilateral lower extremity cellulitis: Keflex 500 mg 3 times daily for 7 days Elevation of lower extremities recommended Patient is encouraged to continue to take diuretics Wound center information has been given to the patient.  She is advised to call to make an appointment for further evaluation and management Patient is encouraged to change the dressing daily Return precautions given Final Clinical Impressions(s) / UC Diagnoses   Final diagnoses:  Bilateral cellulitis of lower leg     Discharge Instructions      Please continue elevating your legs You will need daily wound dressing changes with antibiotic ointment. Please do not use peroxide on your wounds. Take antibiotics as prescribed Please complete the course of antibiotics Please continue Lasix as recommended by your cardiologist Please call the wound care center  and schedule an appointment to be seen sometime next week If you have any further concerns, feel free to return to urgent care to be reevaluated.   ED Prescriptions     Medication Sig Dispense Auth. Provider   cephALEXin (KEFLEX) 500 MG capsule Take 1 capsule (500 mg total) by mouth 3 (three) times daily for 7 days. 21 capsule Loney Peto, Britta Mccreedy, MD      PDMP not reviewed this encounter.   Merrilee Jansky, MD 04/14/23 [id number]

## 2023-04-23 LAB — BASIC METABOLIC PANEL
BUN/Creatinine Ratio: 14 (ref 12–28)
BUN: 15 mg/dL (ref 8–27)
CO2: 25 mmol/L (ref 20–29)
Calcium: 8.8 mg/dL (ref 8.7–10.3)
Chloride: 104 mmol/L (ref 96–106)
Creatinine, Ser: 1.04 mg/dL — ABNORMAL HIGH (ref 0.57–1.00)
Glucose: 116 mg/dL — ABNORMAL HIGH (ref 70–99)
Potassium: 4.4 mmol/L (ref 3.5–5.2)
Sodium: 142 mmol/L (ref 134–144)
eGFR: 52 mL/min/{1.73_m2} — ABNORMAL LOW (ref >59)

## 2023-04-28 ENCOUNTER — Encounter (HOSPITAL_BASED_OUTPATIENT_CLINIC_OR_DEPARTMENT_OTHER): Payer: Medicare Other | Attending: General Surgery | Admitting: Internal Medicine

## 2023-04-28 DIAGNOSIS — L97812 Non-pressure chronic ulcer of other part of right lower leg with fat layer exposed: Secondary | ICD-10-CM | POA: Diagnosis not present

## 2023-04-28 DIAGNOSIS — I739 Peripheral vascular disease, unspecified: Secondary | ICD-10-CM | POA: Diagnosis not present

## 2023-04-28 DIAGNOSIS — I110 Hypertensive heart disease with heart failure: Secondary | ICD-10-CM | POA: Insufficient documentation

## 2023-04-28 DIAGNOSIS — I87313 Chronic venous hypertension (idiopathic) with ulcer of bilateral lower extremity: Secondary | ICD-10-CM | POA: Insufficient documentation

## 2023-04-28 DIAGNOSIS — I5032 Chronic diastolic (congestive) heart failure: Secondary | ICD-10-CM | POA: Diagnosis not present

## 2023-04-28 DIAGNOSIS — L97811 Non-pressure chronic ulcer of other part of right lower leg limited to breakdown of skin: Secondary | ICD-10-CM

## 2023-04-28 DIAGNOSIS — L97821 Non-pressure chronic ulcer of other part of left lower leg limited to breakdown of skin: Secondary | ICD-10-CM

## 2023-04-28 DIAGNOSIS — L97822 Non-pressure chronic ulcer of other part of left lower leg with fat layer exposed: Secondary | ICD-10-CM | POA: Insufficient documentation

## 2023-05-03 ENCOUNTER — Encounter (HOSPITAL_BASED_OUTPATIENT_CLINIC_OR_DEPARTMENT_OTHER): Payer: Medicare Other | Admitting: Internal Medicine

## 2023-05-03 DIAGNOSIS — L97811 Non-pressure chronic ulcer of other part of right lower leg limited to breakdown of skin: Secondary | ICD-10-CM

## 2023-05-03 DIAGNOSIS — L97821 Non-pressure chronic ulcer of other part of left lower leg limited to breakdown of skin: Secondary | ICD-10-CM | POA: Diagnosis not present

## 2023-05-03 DIAGNOSIS — I739 Peripheral vascular disease, unspecified: Secondary | ICD-10-CM | POA: Diagnosis not present

## 2023-05-03 DIAGNOSIS — I87313 Chronic venous hypertension (idiopathic) with ulcer of bilateral lower extremity: Secondary | ICD-10-CM | POA: Diagnosis not present

## 2023-05-03 NOTE — Progress Notes (Signed)
SHAVANTE, BIRCHARD [place] ([id number]) [id number] Page 1 of 4 Visit Report for 04/28/2023 Abuse Risk Screen Details Patient Name: Date of Service: [name], Monica M. 04/28/2023 12:45 PM Medical Record Number: [id number] Patient Account Number: [id number] Date of Birth/Sex: Treating RN: July 14, 1933 (89 y.o. [name] Primary Care Zenola Dezarn: Irena Reichmann Other Clinician: Referring Nathanal Hermiz: Treating Breon Rehm/Extender: Jenne Pane in Treatment: 0 Abuse Risk Screen Items Answer ABUSE RISK SCREEN: Has anyone close to you tried to hurt or harm you recentlyo No Do you feel uncomfortable with anyone in your familyo No Has anyone forced you do things that you didnt want to doo No Electronic Signature(s) Signed: 05/03/2023 4:26:34 PM By: Redmond Pulling RN, BSN Entered By: Redmond Pulling on 04/28/2023 13:24:36 -------------------------------------------------------------------------------- Activities of Daily Living Details Patient Name: Date of Service: [name], Monica M. 04/28/2023 12:45 PM Medical Record Number: [id number] Patient Account Number: [id number] Date of Birth/Sex: Treating RN: [birth year] (89 y.o. [name] Primary Care Jesyka Slaght: Irena Reichmann Other Clinician: Referring Akshaya Toepfer: Treating Kynisha Memon/Extender: Jenne Pane in Treatment: 0 Activities of Daily Living Items Answer Activities of Daily Living (Please select one for each item) Drive Automobile Completely Able T Medications ake Completely Able Use T elephone Completely Able Care for Appearance Completely Able Use T oilet Completely Able Bath / Shower Completely Able Dress Self Completely Able Feed Self Completely Able Walk Completely Able Get In / Out Bed Completely Able Housework Completely Able Prepare Meals Completely Able Handle Money Completely Able Shop for Self Completely Able Electronic Signature(s) Signed: 05/03/2023  4:26:34 PM By: Redmond Pulling RN, BSN Entered By: Redmond Pulling on 04/28/2023 13:25:20 -------------------------------------------------------------------------------- Education Screening Details Patient Name: Date of Service: Monica Almond, Monica RY M. 04/28/2023 12:45 PM Medical Record Number: [id number] Patient Account Number: [id number] Date of Birth/Sex: Treating RN: [birth year] (89 y.o. [name] Primary Care Srah Ake: Irena Reichmann Other Clinician: Referring Elecia Serafin: Treating Sarayah Bacchi/Extender: Jenne Pane in Treatment: 0 [name], [name] ([id number]) [id number] Page 2 of 4 Learning Preferences/Education Level/Primary Language Learning Preference: Explanation, Demonstration, Printed Material Preferred Language: English Cognitive Barrier Language Barrier: No Translator Needed: No Memory Deficit: No Emotional Barrier: No Cultural/Religious Beliefs Affecting Medical Care: No Physical Barrier Impaired Vision: No Impaired Hearing: No Decreased Hand dexterity: No Knowledge/Comprehension Knowledge Level: High Comprehension Level: High Ability to understand written instructions: High Ability to understand verbal instructions: High Motivation Anxiety Level: Calm Cooperation: Cooperative Education Importance: Acknowledges Need Interest in Health Problems: Asks Questions Perception: Coherent Willingness to Engage in Self-Management High Activities: Readiness to Engage in Self-Management High Activities: Electronic Signature(s) Signed: 05/03/2023 4:26:34 PM By: Redmond Pulling RN, BSN Entered By: Redmond Pulling on 04/28/2023 13:26:18 -------------------------------------------------------------------------------- Fall Risk Assessment Details Patient Name: Date of Service: Monica Almond, Monica RY M. 04/28/2023 12:45 PM Medical Record Number: [id number] Patient Account Number: [id number] Date of Birth/Sex: Treating RN: 1933-08-04  (89 y.o. [name] Primary Care Jasma Seevers: Irena Reichmann Other Clinician: Referring Lonnel Gjerde: Treating Shaterra Sanzone/Extender: Jenne Pane in Treatment: 0 Fall Risk Assessment Items Have you had 2 or more falls in the last 12 monthso 0 No Have you had any fall that resulted in injury in the last 12 monthso 0 No FALLS RISK SCREEN History of falling - immediate or within 3 months 0 No Secondary diagnosis (Do you have 2 or more medical diagnoseso) 0 No Ambulatory aid None/bed rest/wheelchair/nurse 0 Yes Crutches/cane/walker 0 No Furniture 0 No Intravenous therapy  Access/Saline/Heparin Lock 0 No Gait/Transferring Normal/ bed rest/ wheelchair 0 Yes Weak (short steps with or without shuffle, stooped but able to lift head while walking, may seek 0 No support from furniture) Impaired (short steps with shuffle, may have difficulty arising from chair, head down, impaired 0 No balance) Mental Status Oriented to own ability [address] [name], [name] ([id number]) [id number] [id number] Page 3 of 4 Signed: 05/03/2023 4:26:34 PM By: Redmond Pulling RN, BSN Entered By: Redmond Pulling on 04/28/2023 13:27:40 -------------------------------------------------------------------------------- Foot Assessment Details Patient Name: Date of Service: [name], Monica M. 04/28/2023 12:45 PM Medical Record Number: [id number] Patient Account Number: [id number] Date of Birth/Sex: Treating RN: 1933/11/12 (89 y.o. [name] Primary Care Cahlil Sattar: Irena Reichmann Other Clinician: Referring Ruthann Angulo: Treating Brandell Maready/Extender: Jenne Pane in Treatment: 0 Foot Assessment Items Site Locations + = Sensation present, - = Sensation absent, C = Callus, U = Ulcer R = Redness, W = Warmth, M = Maceration, PU = Pre-ulcerative lesion F = Fissure, S = Swelling, D = Dryness Assessment Right: Left: Other Deformity: No  No Prior Foot Ulcer: No No Prior Amputation: No No Charcot Joint: No No Ambulatory Status: Gait: Electronic Signature(s) Signed: 05/03/2023 4:26:34 PM By: Redmond Pulling RN, BSN Entered By: Redmond Pulling on 04/28/2023 13:30:18 -------------------------------------------------------------------------------- Nutrition Risk Screening Details Patient Name: Date of Service: [name], Monica Rector M. 04/28/2023 12:45 PM Medical Record Number: [id number] Patient Account Number: [id number] Date of Birth/Sex: Treating RN: 1933/01/28 (89 y.o. [name] Primary Care Ellisha Bankson: Irena Reichmann Other Clinician: Referring Jayde Mcallister: Treating Martine Bleecker/Extender: Jenne Pane in Treatment: 0 Height (in): Weight (lbs): Body Mass Index (BMI): [name], [name] ([id number]) [phone] [id number] Page 4 of 4 Nutrition Risk Screening Items Score Screening NUTRITION RISK SCREEN: I have an illness or condition that made me change the kind and/or amount of food I eat 0 No I eat fewer than two meals per day 0 No I eat few fruits and vegetables, or milk products 0 No I have three or more drinks of beer, liquor or wine almost every day 0 No I have tooth or mouth problems that make it hard for me to eat 0 No I don't always have enough money to buy the food I need 0 No I eat alone most of the time 0 No I take three or more different prescribed or over-the-counter drugs a day 1 Yes Without wanting to, I have lost or gained 10 pounds in the last six months 0 No I am not always physically able to shop, cook and/or feed myself 0 No Nutrition Protocols Good Risk Protocol Moderate Risk Protocol High Risk Proctocol Risk Level: Good Risk Score: 1 Electronic Signature(s) Signed: 05/03/2023 4:26:34 PM By: Redmond Pulling RN, BSN Entered By: Redmond Pulling on 04/28/2023 13:28:09

## 2023-05-04 ENCOUNTER — Telehealth: Payer: Self-pay | Admitting: Cardiovascular Disease

## 2023-05-04 ENCOUNTER — Observation Stay (HOSPITAL_COMMUNITY): Payer: Medicare Other

## 2023-05-04 ENCOUNTER — Encounter (HOSPITAL_COMMUNITY): Payer: Self-pay | Admitting: Emergency Medicine

## 2023-05-04 ENCOUNTER — Other Ambulatory Visit: Payer: Self-pay

## 2023-05-04 ENCOUNTER — Inpatient Hospital Stay (HOSPITAL_COMMUNITY)
Admission: EM | Admit: 2023-05-04 | Discharge: 2023-05-06 | DRG: 312 | Disposition: A | Discharge status: Home Health | Payer: Medicare Other | Attending: Internal Medicine | Admitting: Internal Medicine

## 2023-05-04 ENCOUNTER — Ambulatory Visit: Payer: Medicare Other | Admitting: Physician Assistant

## 2023-05-04 ENCOUNTER — Emergency Department (HOSPITAL_COMMUNITY): Payer: Medicare Other

## 2023-05-04 DIAGNOSIS — E785 Hyperlipidemia, unspecified: Secondary | ICD-10-CM | POA: Diagnosis not present

## 2023-05-04 DIAGNOSIS — I2510 Atherosclerotic heart disease of native coronary artery without angina pectoris: Secondary | ICD-10-CM | POA: Diagnosis present

## 2023-05-04 DIAGNOSIS — I5032 Chronic diastolic (congestive) heart failure: Secondary | ICD-10-CM | POA: Diagnosis present

## 2023-05-04 DIAGNOSIS — Z8249 Family history of ischemic heart disease and other diseases of the circulatory system: Secondary | ICD-10-CM

## 2023-05-04 DIAGNOSIS — E1169 Type 2 diabetes mellitus with other specified complication: Secondary | ICD-10-CM | POA: Diagnosis present

## 2023-05-04 DIAGNOSIS — E0801 Diabetes mellitus due to underlying condition with hyperosmolarity with coma: Secondary | ICD-10-CM | POA: Diagnosis not present

## 2023-05-04 DIAGNOSIS — Z83438 Family history of other disorder of lipoprotein metabolism and other lipidemia: Secondary | ICD-10-CM

## 2023-05-04 DIAGNOSIS — S81801A Unspecified open wound, right lower leg, initial encounter: Secondary | ICD-10-CM | POA: Diagnosis present

## 2023-05-04 DIAGNOSIS — K219 Gastro-esophageal reflux disease without esophagitis: Secondary | ICD-10-CM | POA: Diagnosis present

## 2023-05-04 DIAGNOSIS — M069 Rheumatoid arthritis, unspecified: Secondary | ICD-10-CM | POA: Diagnosis present

## 2023-05-04 DIAGNOSIS — I951 Orthostatic hypotension: Principal | ICD-10-CM | POA: Diagnosis present

## 2023-05-04 DIAGNOSIS — I5042 Chronic combined systolic (congestive) and diastolic (congestive) heart failure: Secondary | ICD-10-CM | POA: Diagnosis present

## 2023-05-04 DIAGNOSIS — E1142 Type 2 diabetes mellitus with diabetic polyneuropathy: Secondary | ICD-10-CM | POA: Diagnosis present

## 2023-05-04 DIAGNOSIS — I739 Peripheral vascular disease, unspecified: Secondary | ICD-10-CM | POA: Diagnosis present

## 2023-05-04 DIAGNOSIS — E1151 Type 2 diabetes mellitus with diabetic peripheral angiopathy without gangrene: Secondary | ICD-10-CM | POA: Diagnosis present

## 2023-05-04 DIAGNOSIS — Z91013 Allergy to seafood: Secondary | ICD-10-CM

## 2023-05-04 DIAGNOSIS — Z1152 Encounter for screening for COVID-19: Secondary | ICD-10-CM

## 2023-05-04 DIAGNOSIS — Z794 Long term (current) use of insulin: Secondary | ICD-10-CM

## 2023-05-04 DIAGNOSIS — Z7983 Long term (current) use of bisphosphonates: Secondary | ICD-10-CM

## 2023-05-04 DIAGNOSIS — R519 Headache, unspecified: Secondary | ICD-10-CM

## 2023-05-04 DIAGNOSIS — Z951 Presence of aortocoronary bypass graft: Secondary | ICD-10-CM

## 2023-05-04 DIAGNOSIS — Z955 Presence of coronary angioplasty implant and graft: Secondary | ICD-10-CM

## 2023-05-04 DIAGNOSIS — Z7984 Long term (current) use of oral hypoglycemic drugs: Secondary | ICD-10-CM

## 2023-05-04 DIAGNOSIS — I110 Hypertensive heart disease with heart failure: Secondary | ICD-10-CM | POA: Diagnosis present

## 2023-05-04 DIAGNOSIS — I252 Old myocardial infarction: Secondary | ICD-10-CM

## 2023-05-04 DIAGNOSIS — Z7902 Long term (current) use of antithrombotics/antiplatelets: Secondary | ICD-10-CM

## 2023-05-04 DIAGNOSIS — Z79899 Other long term (current) drug therapy: Secondary | ICD-10-CM

## 2023-05-04 DIAGNOSIS — E871 Hypo-osmolality and hyponatremia: Secondary | ICD-10-CM | POA: Diagnosis present

## 2023-05-04 DIAGNOSIS — I160 Hypertensive urgency: Secondary | ICD-10-CM | POA: Diagnosis not present

## 2023-05-04 DIAGNOSIS — D649 Anemia, unspecified: Secondary | ICD-10-CM | POA: Diagnosis present

## 2023-05-04 DIAGNOSIS — Z91041 Radiographic dye allergy status: Secondary | ICD-10-CM

## 2023-05-04 DIAGNOSIS — Z96643 Presence of artificial hip joint, bilateral: Secondary | ICD-10-CM | POA: Diagnosis present

## 2023-05-04 DIAGNOSIS — Z7952 Long term (current) use of systemic steroids: Secondary | ICD-10-CM

## 2023-05-04 DIAGNOSIS — E119 Type 2 diabetes mellitus without complications: Secondary | ICD-10-CM

## 2023-05-04 DIAGNOSIS — I358 Other nonrheumatic aortic valve disorders: Secondary | ICD-10-CM | POA: Diagnosis present

## 2023-05-04 LAB — URINALYSIS, ROUTINE W REFLEX MICROSCOPIC
Bacteria, UA: NONE SEEN
Bilirubin Urine: NEGATIVE
Glucose, UA: 150 mg/dL — AB
Hgb urine dipstick: NEGATIVE
Ketones, ur: NEGATIVE mg/dL
Leukocytes,Ua: NEGATIVE
Nitrite: NEGATIVE
Protein, ur: 30 mg/dL — AB
Specific Gravity, Urine: 1.01 (ref 1.005–1.030)
pH: 7 (ref 5.0–8.0)

## 2023-05-04 LAB — COMPREHENSIVE METABOLIC PANEL
ALT: 14 U/L (ref 0–44)
AST: 21 U/L (ref 15–41)
Albumin: 3.4 g/dL — ABNORMAL LOW (ref 3.5–5.0)
Alkaline Phosphatase: 33 U/L — ABNORMAL LOW (ref 38–126)
Anion gap: 11 (ref 5–15)
BUN: 14 mg/dL (ref 8–23)
CO2: 23 mmol/L (ref 22–32)
Calcium: 8.7 mg/dL — ABNORMAL LOW (ref 8.9–10.3)
Chloride: 106 mmol/L (ref 98–111)
Creatinine, Ser: 0.72 mg/dL (ref 0.44–1.00)
GFR, Estimated: >60 mL/min (ref >60)
Glucose, Bld: 163 mg/dL — ABNORMAL HIGH (ref 70–99)
Potassium: 3.5 mmol/L (ref 3.5–5.1)
Sodium: 140 mmol/L (ref 135–145)
Total Bilirubin: 0.4 mg/dL (ref 0.3–1.2)
Total Protein: 7.2 g/dL (ref 6.5–8.1)

## 2023-05-04 LAB — HEPATIC FUNCTION PANEL
ALT: 12 U/L (ref 0–44)
AST: 19 U/L (ref 15–41)
Albumin: 3.6 g/dL (ref 3.5–5.0)
Alkaline Phosphatase: 37 U/L — ABNORMAL LOW (ref 38–126)
Bilirubin, Direct: <0.1 mg/dL (ref 0.0–0.2)
Total Bilirubin: 0.8 mg/dL (ref 0.3–1.2)
Total Protein: 7.9 g/dL (ref 6.5–8.1)

## 2023-05-04 LAB — CBG MONITORING, ED: Glucose-Capillary: 224 mg/dL — ABNORMAL HIGH (ref 70–99)

## 2023-05-04 LAB — URINALYSIS, COMPLETE (UACMP) WITH MICROSCOPIC
Bacteria, UA: NONE SEEN
Bilirubin Urine: NEGATIVE
Glucose, UA: 50 mg/dL — AB
Hgb urine dipstick: NEGATIVE
Ketones, ur: NEGATIVE mg/dL
Leukocytes,Ua: NEGATIVE
Nitrite: NEGATIVE
Protein, ur: NEGATIVE mg/dL
Specific Gravity, Urine: 1.005 (ref 1.005–1.030)
pH: 7 (ref 5.0–8.0)

## 2023-05-04 LAB — CBC WITH DIFFERENTIAL/PLATELET
Abs Immature Granulocytes: 0.02 10*3/uL (ref 0.00–0.07)
Basophils Absolute: 0 10*3/uL (ref 0.0–0.1)
Basophils Relative: 0 %
Eosinophils Absolute: 0.2 10*3/uL (ref 0.0–0.5)
Eosinophils Relative: 3 %
HCT: 38.3 % (ref 36.0–46.0)
Hemoglobin: 11.6 g/dL — ABNORMAL LOW (ref 12.0–15.0)
Immature Granulocytes: 0 %
Lymphocytes Relative: 21 %
Lymphs Abs: 1.3 10*3/uL (ref 0.7–4.0)
MCH: 27.8 pg (ref 26.0–34.0)
MCHC: 30.3 g/dL (ref 30.0–36.0)
MCV: 91.6 fL (ref 80.0–100.0)
Monocytes Absolute: 0.6 10*3/uL (ref 0.1–1.0)
Monocytes Relative: 10 %
Neutro Abs: 3.9 10*3/uL (ref 1.7–7.7)
Neutrophils Relative %: 66 %
Platelets: 315 10*3/uL (ref 150–400)
RBC: 4.18 MIL/uL (ref 3.87–5.11)
RDW: 14.6 % (ref 11.5–15.5)
WBC: 6 10*3/uL (ref 4.0–10.5)
nRBC: 0 % (ref 0.0–0.2)

## 2023-05-04 LAB — TROPONIN I (HIGH SENSITIVITY)
Troponin I (High Sensitivity): 18 ng/L — ABNORMAL HIGH (ref <18)
Troponin I (High Sensitivity): 23 ng/L — ABNORMAL HIGH (ref <18)

## 2023-05-04 LAB — TSH: TSH: 1.065 u[IU]/mL (ref 0.350–4.500)

## 2023-05-04 LAB — PROTIME-INR
INR: 1 (ref 0.8–1.2)
Prothrombin Time: 13.5 seconds (ref 11.4–15.2)

## 2023-05-04 LAB — CK: Total CK: 54 U/L (ref 38–234)

## 2023-05-04 LAB — MAGNESIUM: Magnesium: 2.2 mg/dL (ref 1.7–2.4)

## 2023-05-04 LAB — PHOSPHORUS: Phosphorus: 2.5 mg/dL (ref 2.5–4.6)

## 2023-05-04 MED ORDER — ONDANSETRON HCL 4 MG/2ML IJ SOLN: 4.0000 mg | Freq: Four times a day (QID) | INTRAMUSCULAR | Status: DC | PRN

## 2023-05-04 MED ORDER — SODIUM CHLORIDE 0.9 % IV BOLUS
500.0000 mL | Freq: Once | INTRAVENOUS | Status: AC
  Administered 2023-05-04: 500 mL via INTRAVENOUS

## 2023-05-04 MED ORDER — GABAPENTIN 300 MG PO CAPS
300.0000 mg | ORAL_CAPSULE | Freq: Once | ORAL | Status: AC
  Administered 2023-05-04: 300 mg via ORAL
  Filled 2023-05-04: qty 1

## 2023-05-04 MED ORDER — HYDRALAZINE HCL 20 MG/ML IJ SOLN
10.0000 mg | Freq: Once | INTRAMUSCULAR | Status: AC
  Administered 2023-05-04: 10 mg via INTRAVENOUS
  Filled 2023-05-04: qty 1

## 2023-05-04 MED ORDER — INSULIN ASPART 100 UNIT/ML IJ SOLN
0.0000 [IU] | Freq: Three times a day (TID) | INTRAMUSCULAR | Status: DC
  Administered 2023-05-05: 1 [IU] via SUBCUTANEOUS
  Administered 2023-05-05: 2 [IU] via SUBCUTANEOUS
  Administered 2023-05-06: 5 [IU] via SUBCUTANEOUS

## 2023-05-04 MED ORDER — ACETAMINOPHEN 500 MG PO TABS
1000.0000 mg | ORAL_TABLET | Freq: Once | ORAL | Status: AC
  Administered 2023-05-04: 1000 mg via ORAL
  Filled 2023-05-04: qty 2

## 2023-05-04 MED ORDER — ISOSORBIDE MONONITRATE ER 60 MG PO TB24
60.0000 mg | ORAL_TABLET | Freq: Every day | ORAL | Status: DC
  Administered 2023-05-05 – 2023-05-06 (×2): 60 mg via ORAL
  Filled 2023-05-04 (×2): qty 1

## 2023-05-04 MED ORDER — GABAPENTIN 400 MG PO CAPS
400.0000 mg | ORAL_CAPSULE | Freq: Two times a day (BID) | ORAL | Status: DC
  Administered 2023-05-04 – 2023-05-06 (×4): 400 mg via ORAL
  Filled 2023-05-04 (×4): qty 1

## 2023-05-04 MED ORDER — PREDNISONE 5 MG PO TABS
5.0000 mg | ORAL_TABLET | Freq: Every day | ORAL | Status: DC
  Administered 2023-05-05 – 2023-05-06 (×2): 5 mg via ORAL
  Filled 2023-05-04 (×2): qty 1

## 2023-05-04 MED ORDER — DIPHENHYDRAMINE HCL 50 MG/ML IJ SOLN
25.0000 mg | Freq: Once | INTRAMUSCULAR | Status: AC
  Administered 2023-05-04: 25 mg via INTRAVENOUS
  Filled 2023-05-04: qty 1

## 2023-05-04 MED ORDER — EZETIMIBE 10 MG PO TABS
10.0000 mg | ORAL_TABLET | Freq: Every day | ORAL | Status: DC
  Administered 2023-05-04 – 2023-05-05 (×2): 10 mg via ORAL
  Filled 2023-05-04 (×2): qty 1

## 2023-05-04 MED ORDER — HYDRALAZINE HCL 25 MG PO TABS
50.0000 mg | ORAL_TABLET | Freq: Once | ORAL | Status: AC
  Administered 2023-05-04: 50 mg via ORAL
  Filled 2023-05-04: qty 2

## 2023-05-04 MED ORDER — ACETAMINOPHEN 650 MG RE SUPP: 650.0000 mg | Freq: Four times a day (QID) | RECTAL | Status: DC | PRN

## 2023-05-04 MED ORDER — PANTOPRAZOLE SODIUM 40 MG PO TBEC
40.0000 mg | DELAYED_RELEASE_TABLET | Freq: Two times a day (BID) | ORAL | Status: DC
  Administered 2023-05-05 – 2023-05-06 (×3): 40 mg via ORAL
  Filled 2023-05-04 (×3): qty 1

## 2023-05-04 MED ORDER — HYDRALAZINE HCL 50 MG PO TABS
50.0000 mg | ORAL_TABLET | Freq: Three times a day (TID) | ORAL | Status: DC
  Administered 2023-05-05: 50 mg via ORAL
  Filled 2023-05-04: qty 1

## 2023-05-04 MED ORDER — PROCHLORPERAZINE EDISYLATE 10 MG/2ML IJ SOLN
10.0000 mg | Freq: Once | INTRAMUSCULAR | Status: AC
  Administered 2023-05-04: 10 mg via INTRAVENOUS
  Filled 2023-05-04: qty 2

## 2023-05-04 MED ORDER — CLOPIDOGREL BISULFATE 75 MG PO TABS
75.0000 mg | ORAL_TABLET | Freq: Every day | ORAL | Status: DC
  Administered 2023-05-05 – 2023-05-06 (×2): 75 mg via ORAL
  Filled 2023-05-04 (×2): qty 1

## 2023-05-04 MED ORDER — ROSUVASTATIN CALCIUM 20 MG PO TABS
40.0000 mg | ORAL_TABLET | Freq: Every day | ORAL | Status: DC
  Administered 2023-05-05 – 2023-05-06 (×2): 40 mg via ORAL
  Filled 2023-05-04 (×2): qty 2

## 2023-05-04 MED ORDER — LABETALOL HCL 5 MG/ML IV SOLN
10.0000 mg | Freq: Once | INTRAVENOUS | Status: AC
  Administered 2023-05-04: 10 mg via INTRAVENOUS
  Filled 2023-05-04: qty 4

## 2023-05-04 MED ORDER — HYDROCODONE-ACETAMINOPHEN 5-325 MG PO TABS
1.0000 | ORAL_TABLET | ORAL | Status: DC | PRN
  Administered 2023-05-05: 2 via ORAL
  Filled 2023-05-04: qty 2

## 2023-05-04 MED ORDER — ACETAMINOPHEN 325 MG PO TABS
650.0000 mg | ORAL_TABLET | Freq: Four times a day (QID) | ORAL | Status: DC | PRN
  Administered 2023-05-05: 650 mg via ORAL
  Filled 2023-05-04: qty 2

## 2023-05-04 MED ORDER — CARVEDILOL 6.25 MG PO TABS
6.2500 mg | ORAL_TABLET | Freq: Two times a day (BID) | ORAL | Status: DC
  Administered 2023-05-04 – 2023-05-06 (×4): 6.25 mg via ORAL
  Filled 2023-05-04 (×2): qty 1
  Filled 2023-05-04: qty 2
  Filled 2023-05-04: qty 1

## 2023-05-04 MED ORDER — ONDANSETRON HCL 4 MG PO TABS: 4.0000 mg | ORAL_TABLET | Freq: Four times a day (QID) | ORAL | Status: DC | PRN

## 2023-05-04 MED ORDER — POTASSIUM CHLORIDE CRYS ER 20 MEQ PO TBCR
20.0000 meq | EXTENDED_RELEASE_TABLET | Freq: Once | ORAL | Status: AC
  Administered 2023-05-04: 20 meq via ORAL
  Filled 2023-05-04: qty 1

## 2023-05-04 MED ORDER — CARVEDILOL 6.25 MG PO TABS: 6.2500 mg | ORAL_TABLET | Freq: Two times a day (BID) | ORAL | Status: DC

## 2023-05-04 NOTE — Assessment & Plan Note (Signed)
Continue Crestor  40 mg and Zetia 10 mg

## 2023-05-04 NOTE — ED Notes (Signed)
Patient stated that she feels like she is going to black out and asked for a drink of water. Patient sat back and closed her eyes and repeated herself. This RN observed blood pressure of 126 /51 which was a significant change from prior BP of 171/75. Patient's monitor also indicated a heart rate in the 40s. MD was notified and came to bedside. Patient given 500 mL NS bolus.

## 2023-05-04 NOTE — ED Provider Notes (Signed)
EMERGENCY DEPARTMENT AT [facility] Provider Note  CSN: [id number] Arrival date & time: 05/04/23 1409  Chief Complaint(s) Hypertension  HPI [name] is a 89 y.o. female with PMH HTN, HLD, CAD status post CABG, CHF, T2DM, PAD, peptic ulcer disease, RA who presents emergency department for evaluation of dizziness and hypertension.  Patient states that she awoke this morning with dizziness and a mild headache.  She states that she had a cardiology appointment today but called the cardiologist to cancel because she was feeling unwell.  She found her systolic blood pressures to be greater than 200 and she was instructed to come the emergency department for further evaluation by cardiology.  Patient did take her blood pressure medicines prior to arrival.  Here in the emergency room, patient arrives with significant hypertension at 223/85 and is complaining of a headache but denies chest pain, shortness of breath, abdominal pain, nausea, vomiting or other systemic symptoms.   Past Medical History Past Medical History:  Diagnosis Date   Allergy to IVP dye    Angina, class I (HCC) 04/22/2015   Atherosclerotic heart disease native coronary artery w/angina pectoris (HCC) 1992   a. 1992 s/p PTCA of LAD;  b. 1998 CABG x 3; c. 07/2001 Cath: Sev LM/LAD/LCX dzs, 3/3 patent grafts; d. 11/2013 Cath: stable graft anatomy; e. 10/2016 Cath: native 3VD, VG->OM nl, LIMA->LAD nl, VG->Diag 55.   Atherosclerotic heart disease of native coronary artery with angina pectoris (HCC) 01/14/2010   Qualifier: Diagnosis of  By: Arlyce Dice MD, Barbette Hair    Atrial septal aneurysm    Barrett's esophagus 03/04/2010   Qualifier: Diagnosis of  By: Myrtie Hawk, Amy S    Bilateral leg edema 04/22/2015   Bradycardia, mild briefly to the 40s, asymptomatic 05/16/2013   Carotid arterial disease (HCC)    a. 05/2014 Carotid U/S: No signif bilat ICA stenosis, >60% L ECA.   Chronic anemia    Chronic diastolic CHF  (congestive heart failure) (HCC)    a. 10/2016 Echo: Ef 55-60%, no rwma, Gr1 DD, triv AI, PASP , atrial septal aneurysm.   Chronic diastolic CHF (congestive heart failure), NYHA class 2 (HCC) 08/11/2017   Claudication (HCC) 05/16/2013   Peripheral arterial occlusive disease with lifestyle limiting claudication    Degenerative joint disease 05/30/2018   DIVERTICULOSIS OF COLON 01/14/2010   Qualifier: Diagnosis of  By: Arlyce Dice MD, Barbette Hair    DM (diabetes mellitus), type 2 with peripheral vascular complications (HCC)    On Oral medications.   DOE (dyspnea on exertion) 04/22/2015   Essential hypertension    FECAL INCONTINENCE 03/04/2010   Qualifier: Diagnosis of  By: Monica Becton PA-c, Amy S    GERD (gastroesophageal reflux disease)    Hyperlipidemia with target LDL less than 70    Inflammatory arthritis 10/13/2017   Neuropathy    Non-insulin dependent type 2 diabetes mellitus (HCC) 10/24/2015   NSTEMI (non-ST elevated myocardial infarction) (HCC) 11/01/2016   PAD (peripheral artery disease) (HCC) 05/2013; 09/2013   a. severe calcified POP-1 & TP Trunk Dz bilat;  b . 05/2013 Diamondback Rot Athrectomy (R POP) --> PTA R Pop, DES to R Peroneal;  c. 09/2013 PTA/Stenting of L Pop Clemencia Course - retrograde access);  d. 04/2014 ABI's: R/L: 1.1/1.1.   Preoperative cardiovascular examination 04/15/2020   PUD (peptic ulcer disease)    Rheumatoid arthritis (HCC)    S/P CABG x 3 1998   LIMA-LAD, SVG-OM, SVG-D1   Severe claudication (HCC) 05/2013  Referred for Peripheral Angio (Dr. Allyson Sabal --> Dr. Hoy Finlay in [place])   [facility] ALLERGY 01/14/2010   Qualifier: Diagnosis of  By: Candice Camp CMA (AAMA), [name]     Status post total replacement of right hip 10/12/2018   Vertigo 01/02/2014   Patient Active Problem List   Diagnosis Date Noted   Pain in right hip 11/29/2022   Right arm pain 11/29/2022   Hypertensive urgency 10/20/2022   Nausea and vomiting 10/20/2022   Anxiety 11/04/2021    Avascular necrosis (HCC) 11/04/2021   Chronic interstitial cystitis 11/04/2021   Diabetic peripheral neuropathy associated with type 2 diabetes mellitus (HCC) 11/04/2021   Polyneuropathy due to type 2 diabetes mellitus (HCC) 11/04/2021   Eczema 11/04/2021   History of urinary tract infection 11/04/2021   Neuropathy 11/04/2021   Other long term (current) drug therapy 11/04/2021   Peptic ulcer disease 11/04/2021   Presence of aortocoronary bypass graft 11/04/2021   Skin sensation disturbance 11/04/2021   Vitamin D deficiency 11/04/2021   Gastroesophageal reflux disease 11/04/2021   Rheumatoid arthritis (HCC) 11/04/2021   Peripheral vascular disease (HCC) 11/04/2021   Type 2 diabetes mellitus with other specified complication (HCC) 11/04/2021   Chronic diastolic heart failure (HCC) 01/07/2021   Status post total hip replacement, right 12/04/2020   Status post total replacement of left hip 11/21/2020   Near syncope 07/10/2020   Hx of CABG 07/10/2020   Preoperative cardiovascular examination 04/15/2020   Chest pain 03/18/2020   Unilateral primary osteoarthritis, left hip 04/03/2019   History of right hip replacement 04/03/2019   Status post total replacement of right hip 10/12/2018   Unilateral primary osteoarthritis, right hip 09/12/2018   Degenerative joint disease 05/30/2018   Anal fissure 11/10/2017   Rectal bleeding 11/10/2017   Rectal pain 11/10/2017   Inflammatory arthritis 10/13/2017   Latent tuberculosis by blood test 10/13/2017   Chronic diastolic CHF (congestive heart failure), NYHA class 2 (HCC) 08/11/2017   Fatigue due to treatment 01/09/2017   NSTEMI (non-ST elevated myocardial infarction) (HCC) 11/01/2016   Diabetes mellitus (HCC) 10/24/2015   Non-insulin dependent type 2 diabetes mellitus (HCC) 10/24/2015   Angina, class I (HCC) 04/22/2015   DOE (dyspnea on exertion) 04/22/2015   Bilateral leg edema 04/22/2015   Accumulation of fluid in tissues 03/21/2014    Vertigo, central 01/02/2014   Vertigo 01/02/2014   Limb pain- echymosis, pain Lt radial artery after cath 12/11/2013   Dyslipidemia, goal LDL below 70 05/17/2013   Essential hypertension 05/17/2013   Claudication (HCC) 05/16/2013   PTA/ Stent Rt popliteal and Rt peroneal artery 05/16/13 05/16/2013   PAD,severe calcified pop and tibial peroneal trunk disease bilateraly    ABDOMINAL PAIN RIGHT LOWER QUADRANT 10/07/2010   CVA-STROKE 03/04/2010   BARRETT'S ESOPHAGUS 03/04/2010   FECAL INCONTINENCE 03/04/2010   DM 01/14/2010   Anemia of chronic disease 01/14/2010   CAD (coronary artery disease) 01/14/2010   DIVERTICULOSIS OF COLON 01/14/2010   PERSONAL HISTORY OF COLONIC POLYPS 01/14/2010   SHELLFISH ALLERGY 01/14/2010   Home Medication(s) Prior to Admission medications   Medication Sig Start Date End Date Taking? Authorizing Provider  acetaminophen (TYLENOL) 650 MG CR tablet Take 650 mg by mouth every 8 (eight) hours as needed for pain.    [provider]  acetaminophen-codeine (TYLENOL #3) 300-30 MG tablet Take 1-2 tablets by mouth every 8 (eight) hours as needed. 03/20/21   Kathryne Hitch, MD  alendronate (FOSAMAX) 70 MG tablet Take 70 mg by mouth once a week.  [provider]  carvedilol (COREG) 6.25 MG tablet TAKE 1 TABLET BY MOUTH TWICE A DAY 12/07/22   Runell Gess, MD  Cholecalciferol (VITAMIN D3) 10 MCG (400 UNIT) tablet Take 400 Units by mouth daily.    [provider]  clopidogrel (PLAVIX) 75 MG tablet Take 75 mg by mouth daily.    [provider]  Coenzyme Q10 (CO Q 10 PO) Take 1 tablet by mouth every other day.     [provider]  ezetimibe (ZETIA) 10 MG tablet Take 10 mg by mouth at bedtime.    [provider]  folic acid (FOLVITE) 1 MG tablet Take 1 mg by mouth daily. 08/21/18   [provider]  furosemide (LASIX) 20 MG tablet Take 20 mg by mouth daily as needed. 11/29/22   [provider]   gabapentin (NEURONTIN) 400 MG capsule Take 400 mg by mouth 2 (two) times daily. 09/07/22   [provider]  glimepiride (AMARYL) 2 MG tablet Take 2 mg by mouth daily as needed. 01/07/22   [provider]  hydrALAZINE (APRESOLINE) 50 MG tablet Take 1 tablet (50 mg total) by mouth every 8 (eight) hours. 10/23/22   Willeen Niece, MD  isosorbide mononitrate (IMDUR) 60 MG 24 hr tablet TAKE 1.5 TABLETS (90 MG TOTAL) BY MOUTH DAILY. Marland Kitchen Patient taking differently: Take 60 mg by mouth daily. . 04/12/22   Runell Gess, MD  LORazepam (ATIVAN) 0.5 MG tablet Take 0.5 mg by mouth daily as needed. 12/04/21   [provider]  methotrexate (RHEUMATREX) 2.5 MG tablet Take 20 mg by mouth every Sunday. (8 tablets) 08/21/18   [provider]  nitroGLYCERIN (NITROSTAT) 0.4 MG SL tablet PLACE 1 TABLET (0.4 MG TOTAL) UNDER THE TONGUE EVERY 5 (FIVE) MINUTES AS NEEDED FOR CHEST PAIN. 02/13/18   Creig Hines, NP  pantoprazole (PROTONIX) 40 MG tablet Take 1 tablet (40 mg total) by mouth 2 (two) times daily before a meal. 03/19/20   Rolly Salter, MD  predniSONE (DELTASONE) 5 MG tablet Take 5 mg by mouth daily with breakfast.     [provider]  rosuvastatin (CRESTOR) 40 MG tablet TAKE 1 TABLET BY MOUTH EVERYDAY AT BEDTIME 01/16/21   Runell Gess, MD  sulfaSALAzine (AZULFIDINE) 500 MG EC tablet Take 500 mg by mouth 2 (two) times daily.    [provider]                                                                                                                                    Past Surgical History Past Surgical History:  Procedure Laterality Date   ATHERECTOMY Right 05/15/2013   Procedure: ATHERECTOMY;  Surgeon: Runell Gess, MD;  Location: [facility] CATH LAB;  Service: Cardiovascular;  Laterality: Right;  popliteal   BACK SURGERY     CARDIAC CATHETERIZATION  07/17/01   2 v CAD with LM, circ, LAD,  obtuse diag, mod stenosis of diag vein graft , mild  stenosis of marg vein graft, nl EF, medical treatment   CARDIAC CATHETERIZATION  11/2013   Widely patent LIMA-LAD & SVG-OM with mild progression of proximal stenosis ~40-50% in SVG-D1; Widely patent native RCA with known severe native LCA disease   CARDIAC CATHETERIZATION N/A 11/01/2016   Procedure: Left Heart Cath and Cors/Grafts Angiography;  Surgeon: [name], MD;  Location: [facility] INVASIVE CV LAB;  Service: Cardiovascular: Hemodynamics: High LVEDP!.  Cors/Grafts: LM 55%, o-p LAD 100% then after D1 -> LIMA-LAD patent, SVG-Diag ~55%. small RI wiht ~70% ostial. O-p Cx 80% - pCx 70% --> SVG-bifurcating OM patent. -- med Rx   CORONARY ANGIOPLASTY  1992   PCI to LAD   CORONARY ARTERY BYPASS GRAFT  1998   LIMA-LAD, SVG-diagonal (none roughly 50% stenosis), SVG-OM   LEFT HEART CATHETERIZATION WITH CORONARY ANGIOGRAM N/A 11/27/2013   Procedure: LEFT HEART CATHETERIZATION WITH CORONARY ANGIOGRAM;  Surgeon: [name], MD;  Location: [facility] CATH LAB;  Service: Cardiovascular;  Laterality: N/A;   LOW EXTREMITY DOPPLERS/ABI  10/2016   Patent right SFA, popliteal and peroneal tendons. Patent left SFA and popliteal stent. 30-50% stenosis in the right common femoral and popliteal arteries, 50-74% stenosis in left profunda femoris artery. --> 1 year follow-up.  RABI - 1.2, LABI 1.1   LOWER EXTREMITY ANGIOGRAM N/A 02/22/2013   Procedure: LOWER EXTREMITY ANGIOGRAM;  Surgeon: Runell Gess, MD;  Location: [facility] CATH LAB;  Service: Cardiovascular;  Laterality: N/A;   LOWER EXTREMITY ANGIOGRAM N/A 07/20/2013   Procedure: LOWER EXTREMITY ANGIOGRAM;  Surgeon: Runell Gess, MD;  Location: [facility] CATH LAB;  Service: Cardiovascular;  Laterality: N/A;   NM MYOVIEW LTD  04/03/2013; 5/26'/2016   Lexiscan: a)  Apical perfusion defect with no ischemia. Consider prior infarct versus breast attenuation.;; b) 5/'16: LOW RISK, Nl EF ~55-65%, No Ischemia /Infarction   NM MYOVIEW LTD  10/'19; 4/'21   a) LOW RISK.  EF 55%.  No  ischemia or infarction. b) EF estimated 81%.  Suboptimal images.  May be subtle inferior ischemia-initially read by radiology as intermediate risk, however overread by cardiology to be mild low risk.Marland Kitchen   PERCUTANEOUS STENT INTERVENTION Right 05/15/2013   Procedure: PERCUTANEOUS STENT INTERVENTION;  Surgeon: Runell Gess, MD;  Location: [facility] CATH LAB;  Service: Cardiovascular;  Laterality: Right;  tibioperoneal trunk   Peroneal Artery Stent  05/15/13   Diamondback rot. atherectomy of high grade segmental popliteal stenosis then Chocolate balloonPTA; then angiosculpt PTA of the prox peroneal with stent-Xpedition    pv angio  07/20/13   high-grade calcified popliteal disease with one-vessel runoff via a small anterior tibial artery that has proximal disease as well, no intervention   TOTAL HIP ARTHROPLASTY Right 10/12/2018   Procedure: RIGHT TOTAL HIP ARTHROPLASTY ANTERIOR APPROACH;  Surgeon: Kathryne Hitch, MD;  Location: [facility] ORS;  Service: Orthopedics;  Laterality: Right;   TOTAL HIP ARTHROPLASTY Left 11/21/2020   Procedure: LEFT TOTAL HIP ARTHROPLASTY ANTERIOR APPROACH;  Surgeon: Kathryne Hitch, MD;  Location: [facility] ORS;  Service: Orthopedics;  Laterality: Left;   TRANSTHORACIC ECHOCARDIOGRAM  11/'17; 10/19   a) EF 55-60%. Gr 1 DD. Normal PAP. Aortic Sclerosis.;; b) Normal LV size and function with vigorous EF 65 to 70%.  GR 1 DD.  Severe LA dilation (suggestive of probably worsening: GR 1 DD).   TRANSTHORACIC ECHOCARDIOGRAM  03/18/2020   EF 70-75%.  Hyperdynamic.  GR 1 DD.  No R WMA.  Mild LA dilation.  Mild aortic valve sclerosis but no stenosis.-No change from prior echo   TUBAL LIGATION     Family History Family History  Adopted: Yes  Problem Relation Age of Onset   Hypertension Mother    Hyperlipidemia Mother    Hyperlipidemia Father    Hypertension Father     Social History Social History   Tobacco Use   Smoking status: Never   Smokeless tobacco: Never  Vaping Use    Vaping Use: Never used  Substance Use Topics   Alcohol use: Not Currently   Drug use: No   Allergies Fish allergy, Iodinated contrast media, Latex, Shellfish allergy, Amlodipine, Evolocumab, Other, and Metformin  Review of Systems Review of Systems  Neurological:  Positive for dizziness and headaches.    Physical Exam Vital Signs  I have reviewed the triage vital signs BP (!) 152/65   Pulse 84   Temp 98.9 F (37.2 C) (Oral)   Resp 20   Ht 5\' 3"  (1.6 m)   Wt 68 kg   SpO2 99%   BMI 26.57 kg/m   Physical Exam Vitals and nursing note reviewed.  Constitutional:      General: She is not in acute distress.    Appearance: She is well-developed.  HENT:     Head: Normocephalic and atraumatic.  Eyes:     Conjunctiva/sclera: Conjunctivae normal.  Cardiovascular:     Rate and Rhythm: Normal rate and regular rhythm.     Heart sounds: No murmur heard. Pulmonary:     Effort: Pulmonary effort is normal. No respiratory distress.     Breath sounds: Normal breath sounds.  Abdominal:     Palpations: Abdomen is soft.     Tenderness: There is no abdominal tenderness.  Musculoskeletal:        General: No swelling.     Cervical back: Neck supple.  Skin:    General: Skin is warm and dry.     Capillary Refill: Capillary refill takes less than 2 seconds.  Neurological:     Mental Status: She is alert.  Psychiatric:        Mood and Affect: Mood normal.     ED Results and Treatments Labs (all labs ordered are listed, but only abnormal results are displayed) Labs Reviewed  CBC WITH DIFFERENTIAL/PLATELET - Abnormal; Notable for the following components:      Result Value   Hemoglobin 11.6 (*)    All other components within normal limits  URINALYSIS, ROUTINE W REFLEX MICROSCOPIC - Abnormal; Notable for the following components:   Color, Urine STRAW (*)    Glucose, UA 150 (*)    Protein, ur 30 (*)    All other components within normal limits  COMPREHENSIVE METABOLIC PANEL -  Abnormal; Notable for the following components:   Glucose, Bld 163 (*)    Calcium 8.7 (*)    Albumin 3.4 (*)    Alkaline Phosphatase 33 (*)    All other components within normal limits  TROPONIN I (HIGH SENSITIVITY) - Abnormal; Notable for the following components:   Troponin I (High Sensitivity) 18 (*)    All other components within normal limits  TROPONIN I (HIGH SENSITIVITY)  Radiology CT Head Wo Contrast  Result Date: 05/04/2023 CLINICAL DATA:  Sudden onset headache, hypertension EXAM: CT HEAD WITHOUT CONTRAST TECHNIQUE: Contiguous axial images were obtained from the base of the skull through the vertex without intravenous contrast. RADIATION DOSE REDUCTION: This exam was performed according to the departmental dose-optimization program which includes automated exposure control, adjustment of the mA and/or kV according to patient size and/or use of iterative reconstruction technique. COMPARISON:  10/19/2022 FINDINGS: Brain: No evidence of acute infarction, hemorrhage, hydrocephalus, extra-axial collection or mass lesion/mass effect. Mild periventricular white matter hypodensity. Vascular: No hyperdense vessel or unexpected calcification. Skull: Normal. Negative for fracture or focal lesion. Sinuses/Orbits: No acute finding. Status post left maxillary antrostomy with chronic, inspissated secretions, mucosal thickening, and bony sinus wall thickening. Other: None. IMPRESSION: 1. No acute intracranial pathology. Small-vessel white matter disease. 2. Status post left maxillary antrostomy with findings of chronic sinusitis. Electronically Signed   By: Jearld Lesch M.D.   On: 05/04/2023 15:09    Pertinent labs & imaging results that were available during my care of the patient were reviewed by me and considered in my medical decision making (see MDM for details).  Medications  Ordered in ED Medications  carvedilol (COREG) tablet 6.25 mg (6.25 mg Oral Given 05/04/23 1632)  hydrALAZINE (APRESOLINE) injection 10 mg (10 mg Intravenous Given 05/04/23 1446)  prochlorperazine (COMPAZINE) injection 10 mg (10 mg Intravenous Given 05/04/23 1501)  diphenhydrAMINE (BENADRYL) injection 25 mg (25 mg Intravenous Given 05/04/23 1447)  hydrALAZINE (APRESOLINE) tablet 50 mg (50 mg Oral Given 05/04/23 1539)                                                                                                                                     Procedures Procedures  (including critical care time)  Medical Decision Making / ED Course   This patient presents to the ED for concern of hypertension, headache, this involves an extensive number of treatment options, and is a complaint that carries with it a high risk of complications and morbidity.  The differential diagnosis includes hypertensive urgency, hypertensive emergency, subarachnoid hemorrhage, CVA, intracranial hemorrhage  MDM: Patient seen emergency room for evaluation of hypertension and headache.  Physical exam is unremarkable with no focal motor or sensory deficits, cardiopulmonary exam unremarkable.  Patient given single dose hydralazine and a headache cocktail.  At time of signout, patient pending laboratory evaluation and CT head as well as reevaluation by oncoming provider to monitor for improvement of the patient's blood pressures.  Please see provider signout for continuation of workup   Additional history obtained:  -External records from outside source obtained and reviewed including: Chart review including previous notes, labs, imaging, consultation notes   Lab Tests: -I ordered, reviewed, and interpreted labs.   The pertinent results include:   Labs Reviewed  CBC WITH DIFFERENTIAL/PLATELET - Abnormal; Notable for the following components:      Result Value  Hemoglobin 11.6 (*)    All other components within normal  limits  URINALYSIS, ROUTINE W REFLEX MICROSCOPIC - Abnormal; Notable for the following components:   Color, Urine STRAW (*)    Glucose, UA 150 (*)    Protein, ur 30 (*)    All other components within normal limits  COMPREHENSIVE METABOLIC PANEL - Abnormal; Notable for the following components:   Glucose, Bld 163 (*)    Calcium 8.7 (*)    Albumin 3.4 (*)    Alkaline Phosphatase 33 (*)    All other components within normal limits  TROPONIN I (HIGH SENSITIVITY) - Abnormal; Notable for the following components:   Troponin I (High Sensitivity) 18 (*)    All other components within normal limits  TROPONIN I (HIGH SENSITIVITY)      Imaging Studies ordered: I ordered imaging studies including CT head I independently visualized and interpreted imaging. I agree with the radiologist interpretation   Medicines ordered and prescription drug management: Meds ordered this encounter  Medications   hydrALAZINE (APRESOLINE) injection 10 mg   prochlorperazine (COMPAZINE) injection 10 mg   diphenhydrAMINE (BENADRYL) injection 25 mg   carvedilol (COREG) tablet 6.25 mg   hydrALAZINE (APRESOLINE) tablet 50 mg    -I have reviewed the patients home medicines and have made adjustments as needed  Critical interventions none    Cardiac Monitoring: The patient was maintained on a cardiac monitor.  I personally viewed and interpreted the cardiac monitored which showed an underlying rhythm of: NSR  Social Determinants of Health:  Factors impacting patients care include: none   Reevaluation: After the interventions noted above, I reevaluated the patient and found that they have :improved  Co morbidities that complicate the patient evaluation  Past Medical History:  Diagnosis Date   Allergy to IVP dye    Angina, class I (HCC) 04/22/2015   Atherosclerotic heart disease native coronary artery w/angina pectoris (HCC) 1992   a. 1992 s/p PTCA of LAD;  b. 1998 CABG x 3; c. 07/2001 Cath: Sev  LM/LAD/LCX dzs, 3/3 patent grafts; d. 11/2013 Cath: stable graft anatomy; e. 10/2016 Cath: native 3VD, VG->OM nl, LIMA->LAD nl, VG->Diag 55.   Atherosclerotic heart disease of native coronary artery with angina pectoris (HCC) 01/14/2010   Qualifier: Diagnosis of  By: Arlyce Dice MD, Barbette Hair    Atrial septal aneurysm    Barrett's esophagus 03/04/2010   Qualifier: Diagnosis of  By: Myrtie Hawk, Amy S    Bilateral leg edema 04/22/2015   Bradycardia, mild briefly to the 40s, asymptomatic 05/16/2013   Carotid arterial disease (HCC)    a. 05/2014 Carotid U/S: No signif bilat ICA stenosis, >60% L ECA.   Chronic anemia    Chronic diastolic CHF (congestive heart failure) (HCC)    a. 10/2016 Echo: Ef 55-60%, no rwma, Gr1 DD, triv AI, PASP , atrial septal aneurysm.   Chronic diastolic CHF (congestive heart failure), NYHA class 2 (HCC) 08/11/2017   Claudication (HCC) 05/16/2013   Peripheral arterial occlusive disease with lifestyle limiting claudication    Degenerative joint disease 05/30/2018   DIVERTICULOSIS OF COLON 01/14/2010   Qualifier: Diagnosis of  By: Arlyce Dice MD, Barbette Hair    DM (diabetes mellitus), type 2 with peripheral vascular complications (HCC)    On Oral medications.   DOE (dyspnea on exertion) 04/22/2015   Essential hypertension    FECAL INCONTINENCE 03/04/2010   Qualifier: Diagnosis of  By: Monica Becton PA-c, Amy S    GERD (gastroesophageal reflux disease)    Hyperlipidemia  with target LDL less than 70    Inflammatory arthritis 10/13/2017   Neuropathy    Non-insulin dependent type 2 diabetes mellitus (HCC) 10/24/2015   NSTEMI (non-ST elevated myocardial infarction) (HCC) 11/01/2016   PAD (peripheral artery disease) (HCC) 05/2013; 09/2013   a. severe calcified POP-1 & TP Trunk Dz bilat;  b . 05/2013 Diamondback Rot Athrectomy (R POP) --> PTA R Pop, DES to R Peroneal;  c. 09/2013 PTA/Stenting of L Pop Clemencia Course - retrograde access);  d. 04/2014 ABI's: R/L: 1.1/1.1.   Preoperative  cardiovascular examination 04/15/2020   PUD (peptic ulcer disease)    Rheumatoid arthritis (HCC)    S/P CABG x 3 1998   LIMA-LAD, SVG-OM, SVG-D1   Severe claudication (HCC) 05/2013   Referred for Peripheral Angio (Dr. Allyson Sabal --> Dr. Hoy Finlay in [place])   [facility] ALLERGY 01/14/2010   Qualifier: Diagnosis of  By: Candice Camp CMA (AAMA), [name]     Status post total replacement of right hip 10/12/2018   Vertigo 01/02/2014      Dispostion: I considered admission for this patient, and disposition pending completion of laboratory and imaging studies.  Please see provider signout continuation of workup     Final Clinical Impression(s) / ED Diagnoses Final diagnoses:  None     @PCDICTATION @    Glendora Score, MD 05/04/23 1752

## 2023-05-04 NOTE — Telephone Encounter (Signed)
FYI- Patient called to cancel appointment and stated this morning her blood her pressure was 240/97 she took her medication and its coming down.She just didn't think she can drive herself to her appointment.  She states she feels weak, dizzy and like she was going to faint. I can hear her trying to catch her breath while on the phone. She retook her took her blood pressure while we were on the phone and it was 230/128 advised patient to hang up and call 911. She verbalized understanding.

## 2023-05-04 NOTE — Telephone Encounter (Signed)
Pt c/o BP issue: STAT if pt c/o blurred vision, one-sided weakness or slurred speech  1. What are your last 5 BP readings? BP is 240/97   2. Are you having any other symptoms (ex. Dizziness, headache, blurred vision, passed out)? Dizziness, headache, weakness and lightheadedness   3. What is your BP issue? Pt is very concerned about her BP being so high. She had an appt to come into the office today but she can't make it in due to feeling this way.

## 2023-05-04 NOTE — Assessment & Plan Note (Addendum)
Continue Coreg 6.25 mg po starting tomorrow  Cont Crestor 40 mg po q day and cont Plavix 75 mg po q day

## 2023-05-04 NOTE — ED Notes (Signed)
ED TO INPATIENT HANDOFF REPORT  ED Nurse Name and Phone #:  Theadora Rama RN [phone]  S Name/Age/Gender [name] 89 y.o. female Room/Bed: 018C/018C  Code Status   Code Status: Full Code  Home/SNF/Other Home Patient oriented to: self, place, time, and situation Is this baseline? Yes   Triage Complete: Triage complete  Chief Complaint Hypertensive urgency [I16.0]  Triage Note Pt BIB [facility] from home due to hypertension that started at 1300.  [facility] took manual 260/130 on scene.  Pt does report dizziness and headache.  No SHOB, no chest pain, no n/v/d or blurred vision.  18g left AC.  VS BP 188/108, HR 93, SpO2 97%   Allergies Allergies  Allergen Reactions   Fish Allergy Hives and Shortness Of Breath   Iodinated Contrast Media Shortness Of Breath and Anaphylaxis    Other reaction(s): Unknown   Latex Anaphylaxis, Hives, Shortness Of Breath, Itching and Other (See Comments)    REACTION: wheezing Other reaction(s): Unknown   Shellfish Allergy Anaphylaxis, Hives, Shortness Of Breath and Other (See Comments)    All seafood   Amlodipine Swelling   Evolocumab     Other reaction(s): latex on syringe   Other Itching and Other (See Comments)    Patient reports allergy to perfumed detergents. Causes itching.   Metformin Nausea Only and Nausea And Vomiting    Other reaction(s): Unknown    Level of Care/Admitting Diagnosis ED Disposition     ED Disposition  Admit   Condition  --   Comment  Hospital Area: [facility] [[id number]]  Level of Care: Progressive [102]  Admit to Progressive based on following criteria: CARDIOVASCULAR & THORACIC of moderate stability with acute coronary syndrome symptoms/low risk myocardial infarction/hypertensive urgency/arrhythmias/heart failure potentially compromising stability and stable post cardiovascular intervention patients.  May place patient in observation at [facility] or [facility] if equivalent level of care is available::  No  Covid Evaluation: Asymptomatic - no recent exposure (last 10 days) testing not required  Diagnosis: Hypertensive urgency [960454]  Admitting Physician: Therisa Doyne [[id number]]  Attending Physician: Therisa Doyne [[id number]]          B Medical/Surgery History Past Medical History:  Diagnosis Date   Allergy to IVP dye    Angina, class I (HCC) 04/22/2015   Atherosclerotic heart disease native coronary artery w/angina pectoris (HCC) 1992   a. 1992 s/p PTCA of LAD;  b. 1998 CABG x 3; c. 07/2001 Cath: Sev LM/LAD/LCX dzs, 3/3 patent grafts; d. 11/2013 Cath: stable graft anatomy; e. 10/2016 Cath: native 3VD, VG->OM nl, LIMA->LAD nl, VG->Diag 55.   Atherosclerotic heart disease of native coronary artery with angina pectoris (HCC) 01/14/2010   Qualifier: Diagnosis of  By: Arlyce Dice MD, Barbette Hair    Atrial septal aneurysm    Barrett's esophagus 03/04/2010   Qualifier: Diagnosis of  By: Myrtie Hawk, Amy S    Bilateral leg edema 04/22/2015   Bradycardia, mild briefly to the 40s, asymptomatic 05/16/2013   Carotid arterial disease (HCC)    a. 05/2014 Carotid U/S: No signif bilat ICA stenosis, >60% L ECA.   Chronic anemia    Chronic diastolic CHF (congestive heart failure) (HCC)    a. 10/2016 Echo: Ef 55-60%, no rwma, Gr1 DD, triv AI, PASP , atrial septal aneurysm.   Chronic diastolic CHF (congestive heart failure), NYHA class 2 (HCC) 08/11/2017   Claudication (HCC) 05/16/2013   Peripheral arterial occlusive disease with lifestyle limiting claudication    Degenerative joint disease 05/30/2018  DIVERTICULOSIS OF COLON 01/14/2010   Qualifier: Diagnosis of  By: Arlyce Dice MD, Barbette Hair    DM (diabetes mellitus), type 2 with peripheral vascular complications (HCC)    On Oral medications.   DOE (dyspnea on exertion) 04/22/2015   Essential hypertension    FECAL INCONTINENCE 03/04/2010   Qualifier: Diagnosis of  By: Monica Becton PA-c, Amy S    GERD (gastroesophageal reflux disease)     Hyperlipidemia with target LDL less than 70    Inflammatory arthritis 10/13/2017   Neuropathy    Non-insulin dependent type 2 diabetes mellitus (HCC) 10/24/2015   NSTEMI (non-ST elevated myocardial infarction) (HCC) 11/01/2016   PAD (peripheral artery disease) (HCC) 05/2013; 09/2013   a. severe calcified POP-1 & TP Trunk Dz bilat;  b . 05/2013 Diamondback Rot Athrectomy (R POP) --> PTA R Pop, DES to R Peroneal;  c. 09/2013 PTA/Stenting of L Pop Clemencia Course - retrograde access);  d. 04/2014 ABI's: R/L: 1.1/1.1.   Preoperative cardiovascular examination 04/15/2020   PUD (peptic ulcer disease)    Rheumatoid arthritis (HCC)    S/P CABG x 3 1998   LIMA-LAD, SVG-OM, SVG-D1   Severe claudication (HCC) 05/2013   Referred for Peripheral Angio (Dr. Allyson Sabal --> Dr. Hoy Finlay in [place])   [facility] ALLERGY 01/14/2010   Qualifier: Diagnosis of  By: Candice Camp CMA (AAMA), [name]     Status post total replacement of right hip 10/12/2018   Vertigo 01/02/2014   Past Surgical History:  Procedure Laterality Date   ATHERECTOMY Right 05/15/2013   Procedure: ATHERECTOMY;  Surgeon: Runell Gess, MD;  Location[facility] CATH LAB;  Service: Cardiovascular;  Laterality: Right;  popliteal   BACK SURGERY     CARDIAC CATHETERIZATION  07/17/01   2 v CAD with LM, circ, LAD, obtuse diag, mod stenosis of diag vein graft , mild stenosis of marg vein graft, nl EF, medical treatment   CARDIAC CATHETERIZATION  11/2013   Widely patent LIMA-LAD & SVG-OM with mild progression of proximal stenosis ~40-50% in SVG-D1; Widely patent native RCA with known severe native LCA disease   CARDIAC CATHETERIZATION N/A 11/01/2016   Procedure: Left Heart Cath and Cors/Grafts Angiography;  Surgeon: [name], MD;  Location: [facility] INVASIVE CV LAB;  Service: Cardiovascular: Hemodynamics: High LVEDP!.  Cors/Grafts: LM 55%, o-p LAD 100% then after D1 -> LIMA-LAD patent, SVG-Diag ~55%. small RI wiht ~70% ostial. O-p Cx 80% - pCx 70% --> SVG-bifurcating  OM patent. -- med Rx   CORONARY ANGIOPLASTY  1992   PCI to LAD   CORONARY ARTERY BYPASS GRAFT  1998   LIMA-LAD, SVG-diagonal (none roughly 50% stenosis), SVG-OM   LEFT HEART CATHETERIZATION WITH CORONARY ANGIOGRAM N/A 11/27/2013   Procedure: LEFT HEART CATHETERIZATION WITH CORONARY ANGIOGRAM;  Surgeon: [name], MD;  Location: [facility] CATH LAB;  Service: Cardiovascular;  Laterality: N/A;   LOW EXTREMITY DOPPLERS/ABI  10/2016   Patent right SFA, popliteal and peroneal tendons. Patent left SFA and popliteal stent. 30-50% stenosis in the right common femoral and popliteal arteries, 50-74% stenosis in left profunda femoris artery. --> 1 year follow-up.  RABI - 1.2, LABI 1.1   LOWER EXTREMITY ANGIOGRAM N/A 02/22/2013   Procedure: LOWER EXTREMITY ANGIOGRAM;  Surgeon: Runell Gess, MD;  Location: [facility] CATH LAB;  Service: Cardiovascular;  Laterality: N/A;   LOWER EXTREMITY ANGIOGRAM N/A 07/20/2013   Procedure: LOWER EXTREMITY ANGIOGRAM;  Surgeon: Runell Gess, MD;  Location: [facility] CATH LAB;  Service: Cardiovascular;  Laterality: N/A;   NM MYOVIEW  LTD  04/03/2013; 5/26'/2016   Lexiscan: a)  Apical perfusion defect with no ischemia. Consider prior infarct versus breast attenuation.;; b) 5/'16: LOW RISK, Nl EF ~55-65%, No Ischemia /Infarction   NM MYOVIEW LTD  10/'19; 4/'21   a) LOW RISK.  EF 55%.  No ischemia or infarction. b) EF estimated 81%.  Suboptimal images.  May be subtle inferior ischemia-initially read by radiology as intermediate risk, however overread by cardiology to be mild low risk.Marland Kitchen   PERCUTANEOUS STENT INTERVENTION Right 05/15/2013   Procedure: PERCUTANEOUS STENT INTERVENTION;  Surgeon: Runell Gess, MD;  Location: [facility] CATH LAB;  Service: Cardiovascular;  Laterality: Right;  tibioperoneal trunk   Peroneal Artery Stent  05/15/13   Diamondback rot. atherectomy of high grade segmental popliteal stenosis then Chocolate balloonPTA; then angiosculpt PTA of the prox peroneal with stent-Xpedition     pv angio  07/20/13   high-grade calcified popliteal disease with one-vessel runoff via a small anterior tibial artery that has proximal disease as well, no intervention   TOTAL HIP ARTHROPLASTY Right 10/12/2018   Procedure: RIGHT TOTAL HIP ARTHROPLASTY ANTERIOR APPROACH;  Surgeon: Kathryne Hitch, MD;  Location: [facility] ORS;  Service: Orthopedics;  Laterality: Right;   TOTAL HIP ARTHROPLASTY Left 11/21/2020   Procedure: LEFT TOTAL HIP ARTHROPLASTY ANTERIOR APPROACH;  Surgeon: Kathryne Hitch, MD;  Location: [facility] ORS;  Service: Orthopedics;  Laterality: Left;   TRANSTHORACIC ECHOCARDIOGRAM  11/'17; 10/19   a) EF 55-60%. Gr 1 DD. Normal PAP. Aortic Sclerosis.;; b) Normal LV size and function with vigorous EF 65 to 70%.  GR 1 DD.  Severe LA dilation (suggestive of probably worsening: GR 1 DD).   TRANSTHORACIC ECHOCARDIOGRAM  03/18/2020   EF 70-75%.  Hyperdynamic.  GR 1 DD.  No R WMA.  Mild LA dilation.  Mild aortic valve sclerosis but no stenosis.-No change from prior echo   TUBAL LIGATION       A IV Location/Drains/Wounds Patient Lines/Drains/Airways Status     Active Line/Drains/Airways     Name Placement date Placement time Site Days   Peripheral IV 05/04/23 18 G Right Antecubital 05/04/23  1427  Antecubital  less than 1   External Urinary Catheter 05/04/23  1847  --  less than 1            Intake/Output Last 24 hours  Intake/Output Summary (Last 24 hours) at 05/04/2023 2116 Last data filed at 05/04/2023 1610 Gross per 24 hour  Intake --  Output 1300 ml  Net -1300 ml    Labs/Imaging Results for orders placed or performed during the hospital encounter of 05/04/23 (from the past 48 hour(s))  Urinalysis, Routine w reflex microscopic -Urine, Clean Catch     Status: Abnormal   Collection Time: 05/04/23  2:24 PM  Result Value Ref Range   Color, Urine STRAW (A) YELLOW   APPearance CLEAR CLEAR   Specific Gravity, Urine 1.010 1.005 - 1.030   pH 7.0 5.0 - 8.0   Glucose,  UA 150 (A) NEGATIVE mg/dL   Hgb urine dipstick NEGATIVE NEGATIVE   Bilirubin Urine NEGATIVE NEGATIVE   Ketones, ur NEGATIVE NEGATIVE mg/dL   Protein, ur 30 (A) NEGATIVE mg/dL   Nitrite NEGATIVE NEGATIVE   Leukocytes,Ua NEGATIVE NEGATIVE   RBC / HPF 0-5 0 - 5 RBC/hpf   WBC, UA 0-5 0 - 5 WBC/hpf   Bacteria, UA NONE SEEN NONE SEEN   Squamous Epithelial / HPF 0-5 0 - 5 /HPF    Comment: Performed at [facility]  Hospital Lab, [address] [place], [place] [address]  CBC with Differential     Status: Abnormal   Collection Time: 05/04/23  2:33 PM  Result Value Ref Range   WBC 6.0 4.0 - 10.5 K/uL   RBC 4.18 3.87 - 5.11 MIL/uL   Hemoglobin 11.6 (L) 12.0 - 15.0 g/dL   HCT 60.4 54.0 - 98.1 %   MCV 91.6 80.0 - 100.0 fL   MCH 27.8 26.0 - 34.0 pg   MCHC 30.3 30.0 - 36.0 g/dL   RDW 19.1 47.8 - 29.5 %   Platelets 315 150 - 400 K/uL   nRBC 0.0 0.0 - 0.2 %   Neutrophils Relative % 66 %   Neutro Abs 3.9 1.7 - 7.7 K/uL   Lymphocytes Relative 21 %   Lymphs Abs 1.3 0.7 - 4.0 K/uL   Monocytes Relative 10 %   Monocytes Absolute 0.6 0.1 - 1.0 K/uL   Eosinophils Relative 3 %   Eosinophils Absolute 0.2 0.0 - 0.5 K/uL   Basophils Relative 0 %   Basophils Absolute 0.0 0.0 - 0.1 K/uL   Immature Granulocytes 0 %   Abs Immature Granulocytes 0.02 0.00 - 0.07 K/uL    Comment: Performed at [facility] Lab, [address], [place], [place] [address]  Comprehensive metabolic panel     Status: Abnormal   Collection Time: 05/04/23  3:44 PM  Result Value Ref Range   Sodium 140 135 - 145 mmol/L   Potassium 3.5 3.5 - 5.1 mmol/L   Chloride 106 98 - 111 mmol/L   CO2 23 22 - 32 mmol/L   Glucose, Bld 163 (H) 70 - 99 mg/dL    Comment: Glucose reference range applies only to samples taken after fasting for at least 8 hours.   BUN 14 8 - 23 mg/dL   Creatinine, Ser 8.65 0.44 - 1.00 mg/dL   Calcium 8.7 (L) 8.9 - 10.3 mg/dL   Total Protein 7.2 6.5 - 8.1 g/dL   Albumin 3.4 (L) 3.5 - 5.0 g/dL   AST 21 15 - 41 U/L    ALT 14 0 - 44 U/L   Alkaline Phosphatase 33 (L) 38 - 126 U/L   Total Bilirubin 0.4 0.3 - 1.2 mg/dL   GFR, Estimated >78 >46 mL/min    Comment: (NOTE) Calculated using the CKD-EPI Creatinine Equation (2021)    Anion gap 11 5 - 15    Comment: Performed at [facility] Lab, [address], [place], [place] [address]  Troponin I (High Sensitivity)     Status: Abnormal   Collection Time: 05/04/23  3:44 PM  Result Value Ref Range   Troponin I (High Sensitivity) 18 (H) <18 ng/L    Comment: (NOTE) Elevated high sensitivity troponin I (hsTnI) values and significant  changes across serial measurements may suggest ACS but many other  chronic and acute conditions are known to elevate hsTnI results.  Refer to the "Links" section for chest pain algorithms and additional  guidance. Performed at [facility] Lab, [address] [place], [place] [address]   Troponin I (High Sensitivity)     Status: Abnormal   Collection Time: 05/04/23  4:31 PM  Result Value Ref Range   Troponin I (High Sensitivity) 23 (H) <18 ng/L    Comment: (NOTE) Elevated high sensitivity troponin I (hsTnI) values and significant  changes across serial measurements may suggest ACS but many other  chronic and acute conditions are known to elevate hsTnI results.  Refer to the "Links" section for chest pain algorithms and additional  guidance. Performed at [facility] Lab, [address] [place], [place] [address]   Protime-INR     Status: None   Collection Time: 05/04/23  8:15 PM  Result Value Ref Range   Prothrombin Time 13.5 11.4 - 15.2 seconds   INR 1.0 0.8 - 1.2    Comment: (NOTE) INR goal varies based on device and disease states. Performed at [facility] Lab, [address] [place], [place] [address]   Urinalysis, Complete w Microscopic -Urine, Clean Catch     Status: Abnormal   Collection Time: 05/04/23  8:38 PM  Result Value Ref Range   Color, Urine YELLOW YELLOW   APPearance CLEAR CLEAR    Specific Gravity, Urine 1.005 1.005 - 1.030   pH 7.0 5.0 - 8.0   Glucose, UA 50 (A) NEGATIVE mg/dL   Hgb urine dipstick NEGATIVE NEGATIVE   Bilirubin Urine NEGATIVE NEGATIVE   Ketones, ur NEGATIVE NEGATIVE mg/dL   Protein, ur NEGATIVE NEGATIVE mg/dL   Nitrite NEGATIVE NEGATIVE   Leukocytes,Ua NEGATIVE NEGATIVE   RBC / HPF 0-5 0 - 5 RBC/hpf   WBC, UA 0-5 0 - 5 WBC/hpf   Bacteria, UA NONE SEEN NONE SEEN   Squamous Epithelial / HPF 0-5 0 - 5 /HPF    Comment: Performed at [facility] Lab, [address], [place], [place] [address]   CT Head Wo Contrast  Result Date: 05/04/2023 CLINICAL DATA:  Sudden onset headache, hypertension EXAM: CT HEAD WITHOUT CONTRAST TECHNIQUE: Contiguous axial images were obtained from the base of the skull through the vertex without intravenous contrast. RADIATION DOSE REDUCTION: This exam was performed according to the departmental dose-optimization program which includes automated exposure control, adjustment of the mA and/or kV according to patient size and/or use of iterative reconstruction technique. COMPARISON:  10/19/2022 FINDINGS: Brain: No evidence of acute infarction, hemorrhage, hydrocephalus, extra-axial collection or mass lesion/mass effect. Mild periventricular white matter hypodensity. Vascular: No hyperdense vessel or unexpected calcification. Skull: Normal. Negative for fracture or focal lesion. Sinuses/Orbits: No acute finding. Status post left maxillary antrostomy with chronic, inspissated secretions, mucosal thickening, and bony sinus wall thickening. Other: None. IMPRESSION: 1. No acute intracranial pathology. Small-vessel white matter disease. 2. Status post left maxillary antrostomy with findings of chronic sinusitis. Electronically Signed   By: Jearld Lesch M.D.   On: 05/04/2023 15:09    Pending Labs Unresulted Labs (From admission, onward)     Start     Ordered   05/05/23 0500  Prealbumin  Tomorrow morning,   R        05/04/23 1951    05/04/23 2030  CK  Once,   AD        05/04/23 2030   05/04/23 2030  Hepatic function panel  Once,   AD        05/04/23 2030   05/04/23 2030  Magnesium  Once,   AD        05/04/23 2030   05/04/23 2030  Phosphorus  Once,   AD        05/04/23 2030   05/04/23 2007  Hemoglobin A1c  Once,   R       Comments: To assess prior glycemic control    05/04/23 2006   05/04/23 1631  TSH  Once,   AD        05/04/23 1631   Signed and Held  Magnesium  Tomorrow morning,  R        Signed and Held   Signed and Held  Phosphorus  Tomorrow morning,   R        Signed and Held   Signed and Held  Comprehensive metabolic panel  Tomorrow morning,   R       Question:  Release to patient  Answer:  Immediate   Signed and Held   Signed and Held  CBC  Tomorrow morning,   R       Question:  Release to patient  Answer:  Immediate   Signed and Held            Vitals/Pain Today's Vitals   05/04/23 2000 05/04/23 2015 05/04/23 2020 05/04/23 2025  BP: (!) 126/51 (!) 102/54 (!) 156/76 (!) 163/80  Pulse: (!) 58 (!) 46 (!) 54 65  Resp: 18 (!) 24 (!) 21 19  Temp:      TempSrc:      SpO2: 100% 100% 100% 100%  Weight:      Height:      PainSc:        Isolation Precautions No active isolations  Medications Medications  carvedilol (COREG) tablet 6.25 mg (6.25 mg Oral Given 05/04/23 1632)  carvedilol (COREG) tablet 6.25 mg (has no administration in time range)  insulin aspart (novoLOG) injection 0-9 Units (has no administration in time range)  potassium chloride SA (KLOR-CON M) CR tablet 20 mEq (has no administration in time range)  predniSONE (DELTASONE) tablet 5 mg (has no administration in time range)  hydrALAZINE (APRESOLINE) injection 10 mg (10 mg Intravenous Given 05/04/23 1446)  prochlorperazine (COMPAZINE) injection 10 mg (10 mg Intravenous Given 05/04/23 1501)  diphenhydrAMINE (BENADRYL) injection 25 mg (25 mg Intravenous Given 05/04/23 1447)  hydrALAZINE (APRESOLINE) tablet 50 mg (50 mg Oral Given  05/04/23 1539)  labetalol (NORMODYNE) injection 10 mg (10 mg Intravenous Given 05/04/23 1945)  hydrALAZINE (APRESOLINE) tablet 50 mg (50 mg Oral Given 05/04/23 1946)  acetaminophen (TYLENOL) tablet 1,000 mg (1,000 mg Oral Given 05/04/23 1946)  gabapentin (NEURONTIN) capsule 300 mg (300 mg Oral Given 05/04/23 1946)  sodium chloride 0.9 % bolus 500 mL (500 mLs Intravenous New Bag/Given 05/04/23 2005)    Mobility walks     Focused Assessments Cardiac Assessment Handoff:  Cardiac Rhythm: Normal sinus rhythm Lab Results  Component Value Date   TROPONINI 0.05 (HH) 10/16/2018   Lab Results  Component Value Date   DDIMER 19.65 (H) 03/18/2020   Does the Patient currently have chest pain? No   , Neuro Assessment Handoff:  Swallow screen pass? Yes  Cardiac Rhythm: Normal sinus rhythm       Neuro Assessment: Exceptions to WDL (dizziness and headache) Neuro Checks:      Has TPA been given? No If patient is a Neuro Trauma and patient is going to OR before floor call report to 4N Charge nurse: [phone] or [phone]  , Pulmonary Assessment Handoff:  Lung sounds:   O2 Device: Room Air      R Recommendations: See Admitting Provider Note  Report given to:   Additional Notes:

## 2023-05-04 NOTE — Assessment & Plan Note (Signed)
On Lasix as needed will hold for tonight currently appears to be euvolemic patient had recent episode of hypotension in the emergency department

## 2023-05-04 NOTE — Assessment & Plan Note (Signed)
Restart home meds Given questionable initial complaint of confusion that is now resolved and pt denies Pt also denies having one sided weakness Will obtain MRI head

## 2023-05-04 NOTE — Assessment & Plan Note (Signed)
Continue Crestor and Plavix 75 mg a day

## 2023-05-04 NOTE — H&P (Signed)
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    ___________________________________________________________________________________________________ Physical Exam:    05/04/2023    7:30 PM 05/04/2023    4:32 PM 05/04/2023    4:30 PM  Vitals with BMI  Systolic 179 152 914  Diastolic 76 65 65  Pulse 71 84 78     1. General:  in No  Acute distress   Chronically ill-appearing 2. Psychological: Alert and  Oriented 3. Head/ENT:   Dry Mucous Membranes                          Head Non traumatic, neck supple                            Poor Dentition 4. SKIN: decreased Skin turgor,  Skin clean Dry noted superficial healing wounds on left and right lower extremity no evidence of infection at this time    5. Heart: Regular rate and rhythm no  Murmur, no Rub or gallop 6. Lungs Clear to auscultation bilaterally, no wheezes or crackles   7. Abdomen: Soft,  non-tender, Non distended   obese  bowel sounds present 8. Lower extremities: no clubbing, cyanosis, no  edema 9. Neurologically  strength 5 out of 5 in all 4 extremities cranial nerves II through XII intact 10. MSK: Normal range of motion    Chart has been reviewed  ______________________________________________________________________________________________  Assessment/Plan 89 y.o. female with medical history significant of HTN, HLD, CAD status post CABG, CHF, T2DM, PAD, peptic ulcer disease,   Admitted for   hypertensive urgency  Present on Admission:  Hypertensive urgency  Dyslipidemia, goal LDL below 70  Leg wound, right  PAD,severe calcified pop  and tibial peroneal trunk disease bilateraly  Chronic diastolic CHF (congestive heart failure) (HCC)  Rheumatoid arthritis (HCC)     Dyslipidemia, goal LDL below 70 Continue Crestor  40 mg and Zetia 10 mg  Hypertensive urgency Restart home meds Given questionable initial complaint of confusion that is now resolved and pt denies Pt also denies having one sided weakness Will obtain MRI head   CAD (coronary artery disease) Continue Coreg 6.25 mg po starting tomorrow  Cont Crestor 40 mg po q day and cont Plavix 75 mg po q day  Leg wound, right Mild no evidence of infection will order wound care consult  PAD,severe calcified pop and tibial peroneal trunk disease bilateraly Continue Crestor and Plavix 75 mg a day  Diabetes mellitus (HCC) Order sliding scale and monitor blood sugar check TSH check hemoglobin A1c  Chronic diastolic CHF (congestive heart failure) (HCC) On Lasix as needed  will hold for tonight currently appears to be euvolemic patient had recent episode of hypotension in the emergency department  Rheumatoid arthritis (HCC) Continue prednisone 5 mg po qday   Other plan as per orders.  DVT prophylaxis:  SCD        Code Status:    Code Status: Prior FULL CODE as per patient   I had personally discussed CODE STATUS with patient and family  ACP none   Family Communication:   Family    at  Bedside  plan of care was discussed   with  Daughter,   Diet diabetic diet   Disposition Plan:    To home once workup is complete and patient is stable   Following barriers for discharge:                                                        Pain controlled with PO medications                                Consult Orders  (From admission, onward)           Start     Ordered   05/04/23 1925  Consult to hospitalist  Pg sent by deloris  Once       Provider:  (Not yet assigned)  Question Answer Comment  Place call to: [facility]   Reason for Consult Admit      05/04/23 1924                             Wound care  consulted  Consults called: none, MRI pending if MRI is abnormal will need neurology consult   Admission status:  ED Disposition     ED Disposition  Admit   Condition  --   Comment  Hospital Area: [facility] [[id number]]  Level of Care: Progressive [102]  Admit to Progressive based on following criteria: CARDIOVASCULAR & THORACIC of moderate stability with acute coronary syndrome symptoms/low risk myocardial infarction/hypertensive urgency/arrhythmias/heart failure potentially compromising stability and stable post cardiovascular intervention patients.  May place patient in observation at [facility] or [facility] if equivalent level of care is available:: No  Covid Evaluation: Asymptomatic - no recent exposure (last 10 days) testing not required  Diagnosis: Hypertensive urgency [  161096]  Admitting Physician:  Therisa Doyne [[id number]]  Attending Physician: Therisa Doyne [[id number]]          Obs    Level of care       progressive tele indefinitely please discontinue once patient no longer qualifies COVID-19 Labs    Danuel Felicetti 05/04/2023, 8:49 PM    [facility]     after 2 AM please page floor coverage PA If 7AM-7PM, please contact the day team taking care of the patient using [email or web address]

## 2023-05-04 NOTE — ED Notes (Signed)
Patient appears to have recovered from brief hypotensive episode. BP is now 156/76 and patient is sitting up and appears more alert.

## 2023-05-04 NOTE — ED Provider Notes (Signed)
Dizzy and H/A. Getting IV hydralazine and CT head.   Patient reports that she was dizzy this morning and she called her cardiologist office because she did not think she should drive to the appointment.  While on the phone, the nursing staff had her check her blood pressure.  Patient reports that she got a blood pressure of 240 systolic.  She reports she repeated at their request and it was still 240 systolic and they told her to call EMS.  Patient reports that she did call EMS as instructed.  She however denies that she had any unilateral weakness numbness or dysfunction.  She also denies she had blurred vision or confusion.  Reports her predominant symptom was dizziness and that has since resolved.  She reports she has had an ongoing posterior headache moderate in quality.. Physical Exam  BP (!) 223/85 (BP Location: Right Arm)   Pulse 75   Temp 98.9 F (37.2 C) (Oral)   Resp 19   Ht 5\' 3"  (1.6 m)   Wt 68 kg   SpO2 100%   BMI 26.57 kg/m   Physical Exam  Procedures  Procedures  ED Course / MDM    Medical Decision Making Amount and/or Complexity of Data Reviewed Labs: ordered. Radiology: ordered.  Risk OTC drugs. Prescription drug management. Decision regarding hospitalization.   CT head returned with no acute findings.  I also added patient's home dosing for Coreg and additional 6.25 mg.  By reports she took her a.m. dose.  Also had 50 mg hydralazine administered.  Patient's blood pressures had improved to 150s over 60s.  However, upon recheck at 19: 15, patient still complains of posterior headache.  Recheck blood pressures are now back up to 205/87.  Repeated this twice with good position and cuff placement.  Patient's mental status remains clear.  She reports she is experiencing some slight shortness of breath occasionally but denies any active chest pain.  Patient had received migraine medication previously by Dr. Audrie Lia with combination of Compazine and Benadryl.  Patient  however did continue to complain of posterior headache and at this time with combination of headache, mild shortness of breath and rebound blood pressure is back to hypertensive urgency, will plan for admission.  At this time will administer additional Coreg 6.25 orally as well as another dose of oral hydralazine at 50 mg and labetalol 10 mg IV.  Consult: Reviewed with Dr. Felipa Furnace for admission.  Request we order MRI for hypertensive symptoms with dizziness to rule out CVA.  CRITICAL CARE Performed by: Arby Barrette   Total critical care time: 30 minutes  Critical care time was exclusive of separately billable procedures and treating other patients.  Critical care was necessary to treat or prevent imminent or life-threatening deterioration.  Critical care was time spent personally by me on the following activities: development of treatment plan with patient and/or surrogate as well as nursing, discussions with consultants, evaluation of patient's response to treatment, examination of patient, obtaining history from patient or surrogate, ordering and performing treatments and interventions, ordering and review of laboratory studies, ordering and review of radiographic studies, pulse oximetry and re-evaluation of patient's condition.      Arby Barrette, MD 05/04/23 2005

## 2023-05-04 NOTE — Assessment & Plan Note (Signed)
Order sliding scale and monitor blood sugar check TSH check hemoglobin A1c

## 2023-05-04 NOTE — Subjective & Objective (Signed)
Patient presents complaining of blurred vision one-sided weakness and slurred speech with elevated blood pressure in 240s/97 patient called in her cardiologist because she had an appointment scheduled and told him she does not think she can drive.  She was instructed to call 911 On the arrival blood pressure of 260/130 patient is feeling dizzy and have a headache.  No associated chest pain or shortness of breath After PE blood pressure down to 188/108

## 2023-05-04 NOTE — Assessment & Plan Note (Signed)
Mild no evidence of infection will order wound care consult

## 2023-05-04 NOTE — ED Notes (Signed)
Patient transported to CT 

## 2023-05-04 NOTE — Assessment & Plan Note (Signed)
Continue prednisone 5 mg po qday

## 2023-05-04 NOTE — ED Triage Notes (Signed)
Pt BIB [facility] from home due to hypertension that started at 1300.  [facility] took manual 260/130 on scene.  Pt does report dizziness and headache.  No SHOB, no chest pain, no n/v/d or blurred vision.  18g left AC.  VS BP 188/108, HR 93, SpO2 97%

## 2023-05-05 ENCOUNTER — Inpatient Hospital Stay (HOSPITAL_COMMUNITY): Payer: Medicare Other

## 2023-05-05 DIAGNOSIS — I951 Orthostatic hypotension: Secondary | ICD-10-CM | POA: Diagnosis present

## 2023-05-05 DIAGNOSIS — Z83438 Family history of other disorder of lipoprotein metabolism and other lipidemia: Secondary | ICD-10-CM | POA: Diagnosis not present

## 2023-05-05 DIAGNOSIS — E1151 Type 2 diabetes mellitus with diabetic peripheral angiopathy without gangrene: Secondary | ICD-10-CM | POA: Diagnosis present

## 2023-05-05 DIAGNOSIS — I252 Old myocardial infarction: Secondary | ICD-10-CM | POA: Diagnosis not present

## 2023-05-05 DIAGNOSIS — Z96643 Presence of artificial hip joint, bilateral: Secondary | ICD-10-CM | POA: Diagnosis present

## 2023-05-05 DIAGNOSIS — E1142 Type 2 diabetes mellitus with diabetic polyneuropathy: Secondary | ICD-10-CM | POA: Diagnosis present

## 2023-05-05 DIAGNOSIS — D649 Anemia, unspecified: Secondary | ICD-10-CM | POA: Diagnosis present

## 2023-05-05 DIAGNOSIS — Z91013 Allergy to seafood: Secondary | ICD-10-CM | POA: Diagnosis not present

## 2023-05-05 DIAGNOSIS — Z8249 Family history of ischemic heart disease and other diseases of the circulatory system: Secondary | ICD-10-CM | POA: Diagnosis not present

## 2023-05-05 DIAGNOSIS — E871 Hypo-osmolality and hyponatremia: Secondary | ICD-10-CM | POA: Diagnosis present

## 2023-05-05 DIAGNOSIS — I160 Hypertensive urgency: Secondary | ICD-10-CM | POA: Diagnosis present

## 2023-05-05 DIAGNOSIS — Z7983 Long term (current) use of bisphosphonates: Secondary | ICD-10-CM | POA: Diagnosis not present

## 2023-05-05 DIAGNOSIS — I2510 Atherosclerotic heart disease of native coronary artery without angina pectoris: Secondary | ICD-10-CM | POA: Diagnosis present

## 2023-05-05 DIAGNOSIS — K219 Gastro-esophageal reflux disease without esophagitis: Secondary | ICD-10-CM | POA: Diagnosis present

## 2023-05-05 DIAGNOSIS — E785 Hyperlipidemia, unspecified: Secondary | ICD-10-CM | POA: Diagnosis present

## 2023-05-05 DIAGNOSIS — Z951 Presence of aortocoronary bypass graft: Secondary | ICD-10-CM | POA: Diagnosis not present

## 2023-05-05 DIAGNOSIS — I358 Other nonrheumatic aortic valve disorders: Secondary | ICD-10-CM | POA: Diagnosis present

## 2023-05-05 DIAGNOSIS — Z91041 Radiographic dye allergy status: Secondary | ICD-10-CM | POA: Diagnosis not present

## 2023-05-05 DIAGNOSIS — Z1152 Encounter for screening for COVID-19: Secondary | ICD-10-CM | POA: Diagnosis not present

## 2023-05-05 DIAGNOSIS — Z955 Presence of coronary angioplasty implant and graft: Secondary | ICD-10-CM | POA: Diagnosis not present

## 2023-05-05 DIAGNOSIS — Z79899 Other long term (current) drug therapy: Secondary | ICD-10-CM | POA: Diagnosis not present

## 2023-05-05 DIAGNOSIS — I110 Hypertensive heart disease with heart failure: Secondary | ICD-10-CM | POA: Diagnosis present

## 2023-05-05 DIAGNOSIS — M069 Rheumatoid arthritis, unspecified: Secondary | ICD-10-CM | POA: Diagnosis present

## 2023-05-05 DIAGNOSIS — I5042 Chronic combined systolic (congestive) and diastolic (congestive) heart failure: Secondary | ICD-10-CM | POA: Diagnosis present

## 2023-05-05 LAB — COMPREHENSIVE METABOLIC PANEL
ALT: 14 U/L (ref 0–44)
AST: 18 U/L (ref 15–41)
Albumin: 2.9 g/dL — ABNORMAL LOW (ref 3.5–5.0)
Alkaline Phosphatase: 30 U/L — ABNORMAL LOW (ref 38–126)
Anion gap: 9 (ref 5–15)
BUN: 17 mg/dL (ref 8–23)
CO2: 26 mmol/L (ref 22–32)
Calcium: 8.9 mg/dL (ref 8.9–10.3)
Chloride: 99 mmol/L (ref 98–111)
Creatinine, Ser: 0.97 mg/dL (ref 0.44–1.00)
GFR, Estimated: 57 mL/min — ABNORMAL LOW (ref >60)
Glucose, Bld: 118 mg/dL — ABNORMAL HIGH (ref 70–99)
Potassium: 3.7 mmol/L (ref 3.5–5.1)
Sodium: 134 mmol/L — ABNORMAL LOW (ref 135–145)
Total Bilirubin: 0.6 mg/dL (ref 0.3–1.2)
Total Protein: 6.2 g/dL — ABNORMAL LOW (ref 6.5–8.1)

## 2023-05-05 LAB — GLUCOSE, CAPILLARY
Glucose-Capillary: 97 mg/dL (ref 70–99)
Glucose-Capillary: 136 mg/dL — ABNORMAL HIGH (ref 70–99)
Glucose-Capillary: 194 mg/dL — ABNORMAL HIGH (ref 70–99)
Glucose-Capillary: 55 mg/dL — ABNORMAL LOW (ref 70–99)
Glucose-Capillary: 155 mg/dL — ABNORMAL HIGH (ref 70–99)

## 2023-05-05 LAB — CBC
HCT: 34.1 % — ABNORMAL LOW (ref 36.0–46.0)
Hemoglobin: 10.4 g/dL — ABNORMAL LOW (ref 12.0–15.0)
MCH: 27.7 pg (ref 26.0–34.0)
MCHC: 30.5 g/dL (ref 30.0–36.0)
MCV: 90.7 fL (ref 80.0–100.0)
Platelets: 258 10*3/uL (ref 150–400)
RBC: 3.76 MIL/uL — ABNORMAL LOW (ref 3.87–5.11)
RDW: 14.2 % (ref 11.5–15.5)
WBC: 5.9 10*3/uL (ref 4.0–10.5)
nRBC: 0 % (ref 0.0–0.2)

## 2023-05-05 LAB — PREALBUMIN: Prealbumin: 17 mg/dL — ABNORMAL LOW (ref 18–38)

## 2023-05-05 LAB — MAGNESIUM: Magnesium: 2.1 mg/dL (ref 1.7–2.4)

## 2023-05-05 LAB — PHOSPHORUS: Phosphorus: 5.1 mg/dL — ABNORMAL HIGH (ref 2.5–4.6)

## 2023-05-05 MED ORDER — HYDRALAZINE HCL 25 MG PO TABS
25.0000 mg | ORAL_TABLET | Freq: Three times a day (TID) | ORAL | Status: DC
  Administered 2023-05-06: 25 mg via ORAL
  Filled 2023-05-05: qty 1

## 2023-05-05 NOTE — ED Notes (Signed)
Defibrillator pads placed on patient at doctors verbal instruction.

## 2023-05-05 NOTE — Progress Notes (Addendum)
[facility] PROGRESS NOTE   [name] ZOX:[id number] DOB: [birth year] DOA: 05/04/2023  PCP: Irena Reichmann, DO  Brief History/Interval Summary: 89 y.o. female with medical history significant of HTN, HLD, CAD status post CABG, CHF, T2DM, PAD, peptic ulcer disease, presented with elevated blood pressure dizziness headache. On the arrival blood pressure of 260/130 patient is feeling dizzy and have a headache.  No associated chest pain or shortness of breath.  Patient has been dealing with significant stress recently due to her husband's surgery.  She however has been compliant with her antihypertensives.  She was started back on her antihypertensives in the emergency department.  Hospitalize for observation and further management.  Consultants: None  Procedures: None    Subjective/Interval History: Denies any headaches this morning.  No chest pain or shortness of breath.  No nausea or vomiting.   Assessment/Plan:  Hypertensive urgency Likely triggered by recent stressors in her life.  Denies any noncompliance.  Symptoms were due to elevated blood pressure.  No focal neurological deficits noted on examination.  MRI brain is pending.  Blood pressure has improved.  She did have a significant drop in blood pressure yesterday but that has recovered.  She was started back on her antihypertensives including hydralazine isosorbide mononitrate and is also noted to be on carvedilol. PT evaluation. Seen by PT and noted to be orthostatic with symptoms. Will hold discharge for now.  Dyslipidemia Continue Crestor.  History of coronary artery disease Stable.  Continue home medications.  Right leg wound No evidence for infection.  Outpatient management.  Peripheral artery disease Continue statin and antiplatelet agents.  Diabetes mellitus type 2, controlled Continue home medications at discharge.  Chronic diastolic CHF Seems to be euvolemic.  Continue home medications at  discharge.  Rheumatoid arthritis Continue prednisone daily.  Normocytic anemia Drop in hemoglobin is dilutional.  No evidence of overt bleeding.  Hyponatremia Stable  DVT Prophylaxis: SCDs Code Status: Full code Family Communication: Discussed with patient Disposition Plan: Hopefully discharge later today if MRI brain is negative and if she is able to ambulate with physical therapy.  Status is: Observation The patient remains OBS appropriate and may d/c before 2 midnights.      Medications: Scheduled:  carvedilol  6.25 mg Oral BID WC   clopidogrel  75 mg Oral Daily   ezetimibe  10 mg Oral QHS   gabapentin  400 mg Oral BID   hydrALAZINE  50 mg Oral Q8H   insulin aspart  0-9 Units Subcutaneous TID WC   isosorbide mononitrate  60 mg Oral Daily   pantoprazole  40 mg Oral BID AC   predniSONE  5 mg Oral Q breakfast   rosuvastatin  40 mg Oral Daily   Continuous: WJX:[id number] **OR** acetaminophen, HYDROcodone-acetaminophen, ondansetron **OR** ondansetron (ZOFRAN) IV  Antibiotics: Anti-infectives (From admission, onward)    None       Objective:  Vital Signs  Vitals:   05/04/23 2343 05/05/23 0359 05/05/23 0805 05/05/23 1000  BP: (!) 98/49 (!) 105/48 (!) 128/56 128/61  Pulse: (!) 56 (!) 55 (!) 56 74  Resp:   18   Temp: (!) 97.1 F (36.2 C) 98.2 F (36.8 C) 98.3 F (36.8 C)   TempSrc: Axillary Oral Oral   SpO2: 100% 100% 100%   Weight:      Height:        Intake/Output Summary (Last 24 hours) at 05/05/2023 1052 Last data filed at 05/04/2023 2129 Gross per 24 hour  Intake --  Output 1500 ml  Net -1500 ml   Filed Weights   05/04/23 1417  Weight: 68 kg    General appearance: Awake alert.  In no distress Resp: Clear to auscultation bilaterally.  Normal effort Cardio: S1-S2 is normal regular.  No S3-S4.  No rubs murmurs or bruit GI: Abdomen is soft.  Nontender nondistended.  Bowel sounds are present normal.  No masses organomegaly Extremities: No  edema.  Full range of motion of lower extremities. Neurologic: Alert and oriented x3.  No focal neurological deficits.    Lab Results:  Data Reviewed: I have personally reviewed following labs and reports of the imaging studies  CBC: Recent Labs  Lab 05/04/23 1433 05/05/23 0452  WBC 6.0 5.9  NEUTROABS 3.9  --   HGB 11.6* 10.4*  HCT 38.3 34.1*  MCV 91.6 90.7  PLT 315 258    Basic Metabolic Panel: Recent Labs  Lab 05/04/23 1544 05/04/23 1631 05/05/23 0452  NA 140  --  134*  K 3.5  --  3.7  CL 106  --  99  CO2 23  --  26  GLUCOSE 163*  --  118*  BUN 14  --  17  CREATININE 0.72  --  0.97  CALCIUM 8.7*  --  8.9  MG  --  2.2 2.1  PHOS  --  2.5 5.1*    GFR: Estimated Creatinine Clearance: 37.8 mL/min (by C-G formula based on SCr of 0.97 mg/dL).  Liver Function Tests: Recent Labs  Lab 05/04/23 1544 05/04/23 1631 05/05/23 0452  AST 21 19 18   ALT 14 12 14   ALKPHOS 33* 37* 30*  BILITOT 0.4 0.8 0.6  PROT 7.2 7.9 6.2*  ALBUMIN 3.4* 3.6 2.9*     Coagulation Profile: Recent Labs  Lab 05/04/23 2015  INR 1.0    Cardiac Enzymes: Recent Labs  Lab 05/04/23 1631  CKTOTAL 54      CBG: Recent Labs  Lab 05/04/23 2137 05/05/23 0607  GLUCAP 224* 97     Thyroid Function Tests: Recent Labs    05/04/23 1631  TSH 1.065     Radiology Studies: DG Abd 1 View  Result Date: 05/04/2023 CLINICAL DATA:  Acute encephalopathy. EXAM: ABDOMEN - 1 VIEW COMPARISON:  Pelvic radiograph dated 03/02/2021. FINDINGS: No bowel dilatation or evidence of obstruction. No free air. Multiple surgical clips noted in the upper abdomen. Median sternotomy wires and CABG vascular clips. Degenerative changes of the spine. Bilateral total hip arthroplasties. No acute osseous pathology. IMPRESSION: Nonobstructive bowel gas pattern. Electronically Signed   By: Elgie Collard M.D.   On: 05/04/2023 22:27   CT Head Wo Contrast  Result Date: 05/04/2023 CLINICAL DATA:  Sudden onset  headache, hypertension EXAM: CT HEAD WITHOUT CONTRAST TECHNIQUE: Contiguous axial images were obtained from the base of the skull through the vertex without intravenous contrast. RADIATION DOSE REDUCTION: This exam was performed according to the departmental dose-optimization program which includes automated exposure control, adjustment of the mA and/or kV according to patient size and/or use of iterative reconstruction technique. COMPARISON:  10/19/2022 FINDINGS: Brain: No evidence of acute infarction, hemorrhage, hydrocephalus, extra-axial collection or mass lesion/mass effect. Mild periventricular white matter hypodensity. Vascular: No hyperdense vessel or unexpected calcification. Skull: Normal. Negative for fracture or focal lesion. Sinuses/Orbits: No acute finding. Status post left maxillary antrostomy with chronic, inspissated secretions, mucosal thickening, and bony sinus wall thickening. Other: None. IMPRESSION: 1. No acute intracranial pathology. Small-vessel white matter disease. 2. Status post left maxillary antrostomy with findings  of chronic sinusitis. Electronically Signed   By: Jearld Lesch M.D.   On: 05/04/2023 15:09       LOS: 0 days   Khylin Gutridge Rito Ehrlich  [facility] Pager on [email or web address]  05/05/2023, 10:52 AM

## 2023-05-05 NOTE — Progress Notes (Signed)
Transported to MRI via patient transport

## 2023-05-05 NOTE — Evaluation (Signed)
Physical Therapy Evaluation & Vestibular Assessment Patient Details Name: [name] MRN: [id number] DOB: Dec 26, 1932 Today's Date: 05/05/2023  History of Present Illness  Pt is an 89 y.o. female who presented 05/04/23 with dizziness and HTN. CT of head negative, awaiting brain MRI. PMH: CAD s/p CABGx3, anemia, CHF, DM2, HTN, GERD, HLD, NSTEMI, PAD, RA, vertigo, R hip THA 2019   Clinical Impression  Pt presents with condition above and deficits mentioned below, see PT Problem List. PTA, she was independent without DME, living alone in a 1-level house with a ramped entrance. Her husband is at a SNF for rehab currently and her daughter lives about 1 hour away. Currently, pt displays some deficits in gross strength and balance but is primarily limited by symptomatic orthostatic hypotension, see below, which could be the cause of her reported dizziness. Notified RN and MD. Pt will likely progress well with mobility once her BP and symptoms are more stable, thus anticipate pt will be able to d/c home with HHPT. Will continue to follow acutely.   Vestibular Assessment - 05/05/23 0001       Vestibular Assessment   General Observation Pt denies any spinning dizziness or vertigo. Pt reports a hx of vertigo though and denies this being similar. She describes her dizziness more as feeling "lightheaded" or "not myself", which was reproduced with positional changes. This dizziness appears to likely be related to her orthostatic hypotension and not vestibular in origin considering these factors and no appreciative nystagmus was noted with vestibular testing.      Symptom Behavior   Subjective history of current problem Pt reports a "lightheaded" dizziness that began yesterday. She denies it feeling like vertigo or the room spinning. She reports a hx of vertigo.    Type of Dizziness  Lightheadedness;"Funny feeling in head"    Frequency of Dizziness with positional changes    Duration of Dizziness >30 seconds     Symptom Nature Motion provoked;Positional    Aggravating Factors Supine to sit;Sit to stand    Relieving Factors Lying supine;Rest    History of similar episodes hx of vertigo, but denies this feeling the same; reports she has passed out from being "dehydrated" about 1 year ago      Oculomotor Exam   Oculomotor Alignment Normal    Ocular ROM WFL    Spontaneous Absent   difficulty tracking in all directions though   Gaze-induced  Absent   difficulty tracking in all directions though   Smooth Pursuits Saccades   difficulty tracking in all directions   Comment reports she normally wears glasses and does not have them currently, but does report her vision seems abnormal currently; does endorse moments of diplopia, particularly with neck rotation, but then resolves      Positional Testing   Dix-Hallpike Dix-Hallpike Right;Dix-Hallpike Left    Horizontal Canal Testing Horizontal Canal Right;Horizontal Canal Left      Dix-Hallpike Right   Dix-Hallpike Right Duration negative    Dix-Hallpike Right Symptoms No nystagmus   reported pressure in head and difficult to detect nystagmus as pt has difficulty focusing on an object and not looking all around her     Dix-Hallpike Left   Dix-Hallpike Left Duration negative    Dix-Hallpike Left Symptoms No nystagmus   reported pressure in head and difficult to detect nystagmus as pt has difficulty focusing on an object and not looking all around her     Horizontal Canal Right   Horizontal Canal Right Duration negative  Horizontal Canal Right Symptoms Normal   no dizziness and difficult to detect nystagmus as pt has difficulty focusing on an object and not looking all around her     Horizontal Canal Left   Horizontal Canal Left Duration negative    Horizontal Canal Left Symptoms Normal   no dizziness and difficult to detect nystagmus as pt has difficulty focusing on an object and not looking all around her     Orthostatics   BP supine (x 5 minutes)  135/55    HR supine (x 5 minutes) 76    BP sitting 121/99    HR sitting 88    BP standing (after 1 minute) 107/65    HR standing (after 1 minute) 83    Orthostatics Comment Pt symptomatic with positional changes                 Recommendations for follow up therapy are one component of a multi-disciplinary discharge planning process, led by the attending physician.  Recommendations may be updated based on patient status, additional functional criteria and insurance authorization.  Follow Up Recommendations       Assistance Recommended at Discharge Intermittent Supervision/Assistance  Patient can return home with the following  A little help with walking and/or transfers;A little help with bathing/dressing/bathroom;Assistance with cooking/housework;Assist for transportation;Help with stairs or ramp for entrance    Equipment Recommendations Rollator (4 wheels) (pending progress)  Recommendations for Other Services  OT consult    Functional Status Assessment Patient has had a recent decline in their functional status and demonstrates the ability to make significant improvements in function in a reasonable and predictable amount of time.     Precautions / Restrictions Precautions Precautions: Fall;Other (comment) Precaution Comments: watch BP (orthostatic 5/23) Restrictions Weight Bearing Restrictions: No      Mobility  Bed Mobility Overal bed mobility: Needs Assistance Bed Mobility: Rolling, Sidelying to Sit, Sit to Supine Rolling: Supervision Sidelying to sit: Min guard   Sit to supine: Min guard   General bed mobility comments: Extra time, no physical assist needed with pt using bed rails.    Transfers Overall transfer level: Needs assistance Equipment used: 1 person hand held assist, None Transfers: Sit to/from Stand Sit to Stand: Min assist           General transfer comment: x2 sit to stand reps from EOB, minA for balance first rep without UE support  and minA HHA stability on 2nd rep    Ambulation/Gait Ambulation/Gait assistance: Min assist Gait Distance (Feet): 3 Feet Assistive device: 1 person hand held assist Gait Pattern/deviations: Step-through pattern, Decreased stride length Gait velocity: reduced Gait velocity interpretation: <1.31 ft/sec, indicative of household ambulator   General Gait Details: Pt taking slow, small lateral steps along EOB with UE support minA for safety. Limited distance to get pt to [facility] due to pt having symptomatic othostatic hypotension.  Stairs            Wheelchair Mobility    Modified Rankin (Stroke Patients Only) Modified Rankin (Stroke Patients Only) Pre-Morbid Rankin Score: No symptoms Modified Rankin: Moderately severe disability     Balance Overall balance assessment: Needs assistance Sitting-balance support: No upper extremity supported, Feet supported Sitting balance-Leahy Scale: Good     Standing balance support: Single extremity supported, Bilateral upper extremity supported, No upper extremity supported, During functional activity Standing balance-Leahy Scale: Fair Standing balance comment: Able to stand statically without UE support but displayed a sway, needing minA to maintain balance. Benefits from  UE support at this time                             Pertinent Vitals/Pain Pain Assessment Pain Assessment: Faces Faces Pain Scale: Hurts little more Pain Location: headache Pain Descriptors / Indicators: Headache Pain Intervention(s): Limited activity within patient's tolerance, Monitored during session, Repositioned    Home Living Family/patient expects to be discharged to:: Private residence Living Arrangements: Alone Available Help at Discharge: Family;Available PRN/intermittently Type of Home: House Home Access: Ramped entrance       Home Layout: One level Home Equipment: Cane - single point;Shower seat      Prior Function Prior Level of Function  : Independent/Modified Independent             Mobility Comments: Does not typically use an AD, intermittently will use SPC as needed though ADLs Comments: Reports difficulty getting in and out of shower and plans to renovate to a walk-in shower     Hand Dominance        Extremity/Trunk Assessment   Upper Extremity Assessment Upper Extremity Assessment: Defer to OT evaluation    Lower Extremity Assessment Lower Extremity Assessment: Generalized weakness    Cervical / Trunk Assessment Cervical / Trunk Assessment: Kyphotic  Communication   Communication: No difficulties  Cognition Arousal/Alertness: Awake/alert Behavior During Therapy: WFL for tasks assessed/performed Overall Cognitive Status: Within Functional Limits for tasks assessed                                          General Comments General comments (skin integrity, edema, etc.): SpO2 >/= 90% on RA throughout; BP 130/66 (86) & HR 75 bpm supine end of session, other vitals can be seen above    Exercises     Assessment/Plan    PT Assessment Patient needs continued PT services  PT Problem List Decreased strength;Decreased activity tolerance;Decreased balance;Decreased mobility;Cardiopulmonary status limiting activity       PT Treatment Interventions DME instruction;Gait training;Functional mobility training;Therapeutic activities;Therapeutic exercise;Balance training;Neuromuscular re-education;Patient/family education    PT Goals (Current goals can be found in the Care Plan section)  Acute Rehab PT Goals Patient Stated Goal: to improve and go home PT Goal Formulation: With patient Time For Goal Achievement: 05/19/23 Potential to Achieve Goals: Good    Frequency Min 3X/week     Co-evaluation               AM-PAC PT "6 Clicks" Mobility  Outcome Measure Help needed turning from your back to your side while in a flat bed without using bedrails?: A Little Help needed moving  from lying on your back to sitting on the side of a flat bed without using bedrails?: A Little Help needed moving to and from a bed to a chair (including a wheelchair)?: A Little Help needed standing up from a chair using your arms (e.g., wheelchair or bedside chair)?: A Little Help needed to walk in hospital room?: Total (<20 ft) Help needed climbing 3-5 steps with a railing? : Total 6 Click Score: 14    End of Session Equipment Utilized During Treatment: Gait belt Activity Tolerance: Treatment limited secondary to medical complications (Comment) (symptomatic orthostatic hypotension) Patient left: in bed;with call bell/phone within reach;with bed alarm set Nurse Communication: Mobility status;Other (comment) (vitals) PT Visit Diagnosis: Unsteadiness on feet (R26.81);Other abnormalities of gait and mobility (  R26.89);Muscle weakness (generalized) (M62.81);Difficulty in walking, not elsewhere classified (R26.2);Dizziness and giddiness (R42)    Time: 7829-5621 PT Time Calculation (min) (ACUTE ONLY): 34 min   Charges:   PT Evaluation $PT Eval Moderate Complexity: 1 Mod PT Treatments $Therapeutic Activity: 8-22 mins        Raymond Gurney, PT, DPT Acute Rehabilitation Services  Office: [phone]   Jewel Baize 05/05/2023, 11:53 AM

## 2023-05-05 NOTE — Progress Notes (Signed)
CBG 55 at 21:11. Pt given OJ with meds and CBG retaken.   21:37 CBG 155.  Pt denies any s/s of hypo or hyperglycemia.

## 2023-05-05 NOTE — Consult Note (Signed)
WOC Nurse Consult Note: Reason for Consult: leg wounds Patient has been seen in [facility] at [facility] 04/28/23 and at that time no topical care was recommenced.  History of DM and PAD. Documented as dry, flaky, scabbed areas on the bilaterally LEs. [place] sent to bedside nurses to clarify needs for LEs  Wound type: partial thickness, less than 0.1cm area on the left medial malleolar region Pressure Injury POA: NA Measurement:left medial 0.1cmx 0.1cmx 0.1cm  Wound ZOX:WRUE Drainage (amount, consistency, odor) none Periwound:intact  Dressing procedure/placement/frequency:silicone foam to protect newly reepithelialized areas, change every 3 days    No other needs.   Discussed POC with patient and bedside nurse.  Re consult if needed, will not follow at this time. Thanks  Juliannah Ohmann [employer], RN,CWOCN, CNS, CWON-AP [phone])

## 2023-05-05 NOTE — TOC Initial Note (Signed)
Transition of Care [facility]) - Initial/Assessment Note    Patient Details  Name: [name] MRN: [id number] Date of Birth: January 21, 1933  Transition of [facility] CM/SW Contact:    Lockie Pares, RN Phone Number: 05/05/2023, 12:09 PM  Clinical Narrative:                 Discussed with Team via securechat. 89 year old patient currently lives alone, spouse at SNF. Has daughter that an assist in transporting home when DC. Discussed discharge planning with patient via phone. She has all DME needed, had [facility] previously, will accept them again.  Called Kelly from [place] and was accepted for PT OT.  Will need face to face for HH orders upon DC. TOC will follow  for future needs, recommendations,and transitions of care.  Expected Discharge Plan: Home w Home Health Services Barriers to Discharge: No Barriers Identified   Patient Goals and CMS Choice Patient states their goals for this hospitalization and ongoing recovery are:: going home   Choice offered to / list presented to : Patient      Expected Discharge Plan and Services     Post Acute Care Choice: Home Health Living arrangements for the past 2 months: Single Family Home (husband in SNF currently)                           HH Arranged: PT, OT HH Agency: [facility] Date [facility] Agency Contacted: 05/05/23 Time HH Agency Contacted: [id number] Representative spoke with at [facility] Agency: Tresa Endo  Prior Living Arrangements/Services Living arrangements for the past 2 months: Single Family Home (husband in SNF currently) Lives with:: Self (Has daughter that lives a hour away) Patient language and need for interpreter reviewed:: Yes Do you feel safe going back to the place where you live?: Yes      Need for Family Participation in Patient Care: Yes (Comment)     Criminal Activity/Legal Involvement Pertinent to Current Situation/Hospitalization: No - Comment as needed  Activities of Daily Living Home Assistive  Devices/Equipment: CBG Meter, Cane (specify quad or straight), Blood pressure cuff ADL Screening (condition at time of admission) Patient's cognitive ability adequate to safely complete daily activities?: Yes Is the patient deaf or have difficulty hearing?: Yes Does the patient have difficulty seeing, even when wearing glasses/contacts?: No Does the patient have difficulty concentrating, remembering, or making decisions?: Yes Patient able to express need for assistance with ADLs?: Yes Does the patient have difficulty dressing or bathing?: No Independently performs ADLs?: Yes (appropriate for developmental age) Does the patient have difficulty walking or climbing stairs?: No Weakness of Legs: None Weakness of Arms/Hands: None  Permission Sought/Granted                  Emotional Assessment   Attitude/Demeanor/Rapport: Gracious Affect (typically observed): Accepting Orientation: : Oriented to Self, Oriented to Place, Oriented to Situation Alcohol / Substance Use: Not Applicable Psych Involvement: No (comment)  Admission diagnosis:  Hypertensive urgency [I16.0] Acute nonintractable headache, unspecified headache type [R51.9] Patient Active Problem List   Diagnosis Date Noted   Leg wound, right 05/04/2023   Pain in right hip 11/29/2022   Right arm pain 11/29/2022   Hypertensive urgency 10/20/2022   Nausea and vomiting 10/20/2022   Anxiety 11/04/2021   Avascular necrosis (HCC) 11/04/2021   Chronic interstitial cystitis 11/04/2021   Diabetic peripheral neuropathy associated with type 2 diabetes mellitus (HCC) 11/04/2021   Polyneuropathy due  to type 2 diabetes mellitus (HCC) 11/04/2021   Eczema 11/04/2021   History of urinary tract infection 11/04/2021   Neuropathy 11/04/2021   Other long term (current) drug therapy 11/04/2021   Peptic ulcer disease 11/04/2021   Presence of aortocoronary bypass graft 11/04/2021   Skin sensation disturbance 11/04/2021   Vitamin D deficiency  11/04/2021   Gastroesophageal reflux disease 11/04/2021   Rheumatoid arthritis (HCC) 11/04/2021   Peripheral vascular disease (HCC) 11/04/2021   Type 2 diabetes mellitus with other specified complication (HCC) 11/04/2021   Chronic diastolic CHF (congestive heart failure) (HCC) 01/07/2021   Status post total hip replacement, right 12/04/2020   Status post total replacement of left hip 11/21/2020   Near syncope 07/10/2020   Hx of CABG 07/10/2020   Preoperative cardiovascular examination 04/15/2020   Chest pain 03/18/2020   Unilateral primary osteoarthritis, left hip 04/03/2019   History of right hip replacement 04/03/2019   Status post total replacement of right hip 10/12/2018   Unilateral primary osteoarthritis, right hip 09/12/2018   Degenerative joint disease 05/30/2018   Anal fissure 11/10/2017   Rectal bleeding 11/10/2017   Rectal pain 11/10/2017   Inflammatory arthritis 10/13/2017   Latent tuberculosis by blood test 10/13/2017   Chronic diastolic CHF (congestive heart failure), NYHA class 2 (HCC) 08/11/2017   Fatigue due to treatment 01/09/2017   NSTEMI (non-ST elevated myocardial infarction) (HCC) 11/01/2016   Diabetes mellitus (HCC) 10/24/2015   Non-insulin dependent type 2 diabetes mellitus (HCC) 10/24/2015   Angina, class I (HCC) 04/22/2015   DOE (dyspnea on exertion) 04/22/2015   Bilateral leg edema 04/22/2015   Accumulation of fluid in tissues 03/21/2014   Vertigo, central 01/02/2014   Vertigo 01/02/2014   Limb pain- echymosis, pain Lt radial artery after cath 12/11/2013   Dyslipidemia, goal LDL below 70 05/17/2013   Essential hypertension 05/17/2013   Claudication (HCC) 05/16/2013   PTA/ Stent Rt popliteal and Rt peroneal artery 05/16/13 05/16/2013   PAD,severe calcified pop and tibial peroneal trunk disease bilateraly    ABDOMINAL PAIN RIGHT LOWER QUADRANT 10/07/2010   CVA-STROKE 03/04/2010   BARRETT'S ESOPHAGUS 03/04/2010   FECAL INCONTINENCE 03/04/2010   DM  01/14/2010   Anemia of chronic disease 01/14/2010   CAD (coronary artery disease) 01/14/2010   DIVERTICULOSIS OF COLON 01/14/2010   PERSONAL HISTORY OF COLONIC POLYPS 01/14/2010   SHELLFISH ALLERGY 01/14/2010   PCP:  Irena Reichmann, DO Pharmacy:   [facility] [phone] [place], [place] - [address] [place] [address] Phone: [phone] Fax: [phone]     Social Determinants of Health (SDOH) Social History: SDOH Screenings   Food Insecurity: No Food Insecurity (05/04/2023)  Housing: Low Risk  (05/04/2023)  Transportation Needs: No Transportation Needs (05/04/2023)  Utilities: Not At Risk (05/04/2023)  Tobacco Use: Low Risk  (05/04/2023)   SDOH Interventions:     Readmission Risk Interventions     No data to display

## 2023-05-06 ENCOUNTER — Encounter (HOSPITAL_BASED_OUTPATIENT_CLINIC_OR_DEPARTMENT_OTHER): Payer: Medicare Other | Admitting: General Surgery

## 2023-05-06 DIAGNOSIS — I160 Hypertensive urgency: Secondary | ICD-10-CM | POA: Diagnosis not present

## 2023-05-06 LAB — GLUCOSE, CAPILLARY
Glucose-Capillary: 118 mg/dL — ABNORMAL HIGH (ref 70–99)
Glucose-Capillary: 261 mg/dL — ABNORMAL HIGH (ref 70–99)

## 2023-05-06 LAB — HEMOGLOBIN A1C
Hgb A1c MFr Bld: 8.6 % — ABNORMAL HIGH (ref 4.8–5.6)
Mean Plasma Glucose: 200 mg/dL

## 2023-05-06 MED ORDER — HYDRALAZINE HCL 25 MG PO TABS: 25.0000 mg | ORAL_TABLET | Freq: Three times a day (TID) | ORAL | 1 refills | Status: DC

## 2023-05-06 NOTE — Progress Notes (Signed)
Discharge instructions discussed with the patient. All questions answered. IV removed and telemetry discontinued. Patient to be wheeled off of unit for transport home by daughter. Patient cell phone returned.  Melony Overly, RN

## 2023-05-06 NOTE — Discharge Summary (Signed)
[facility]  Physician Discharge Summary   Patient ID: [name] MRN: [id number] DOB/AGE: 87-Aug-1934 87 y.o.  Admit date: 05/04/2023 Discharge date: 05/06/2023    PCP: Irena Reichmann, DO  DISCHARGE DIAGNOSES:    Hypertensive urgency   Dyslipidemia, goal LDL below 70   PAD,severe calcified pop and tibial peroneal trunk disease bilateraly   Diabetes mellitus (HCC)   Chronic diastolic CHF (congestive heart failure) (HCC)   Rheumatoid arthritis (HCC)   Leg wound, right   Orthostatic hypotension   RECOMMENDATIONS FOR OUTPATIENT FOLLOW UP: Patient instructed to follow-up with her primary care provider for further management of hypertension Patient instructed to follow-up with ENT regarding her right ear issues.   Home Health: PT and OT Equipment/Devices: Walker  CODE STATUS: Full code  DISCHARGE CONDITION: fair  Diet recommendation: As before  INITIAL HISTORY: 89 y.o. female with medical history significant of HTN, HLD, CAD status post CABG, CHF, T2DM, PAD, peptic ulcer disease, presented with elevated blood pressure dizziness headache. On the arrival blood pressure of 260/130 patient is feeling dizzy and have a headache.  No associated chest pain or shortness of breath.  Patient has been dealing with significant stress recently due to her husband's surgery.  She however has been compliant with her antihypertensives.  She was started back on her antihypertensives in the emergency department.  Hospitalize for observation and further management.   HOSPITAL COURSE:   Hypertensive urgency Likely triggered by recent stressors in her life.  Denies any noncompliance.  Symptoms were due to elevated blood pressure.  No focal neurological deficits noted on examination.   After she was given her medications she had a significant drop in blood pressure.  Hydralazine dose was decreased.  She was continued on carvedilol and isosorbide mononitrate.  Blood pressures have stabilized.   She was able to work with physical therapy better today than yesterday.  Home health has been ordered.  Okay for discharge home.     Dizziness Could be having balance issues due to inner ear pathology.  Examination does not reveal any concerning findings.  She will need to be seen by ENT.  She is working on getting an appointment. MRI brain did not show any acute findings.   Dyslipidemia Continue Crestor.   History of coronary artery disease Stable.  Continue home medications.   Right leg wound No evidence for infection.  Outpatient management.   Peripheral artery disease Continue statin and antiplatelet agents.   Diabetes mellitus type 2, controlled Continue home medications at discharge.   Chronic diastolic CHF Seems to be euvolemic.  Continue home medications at discharge.   Rheumatoid arthritis Continue prednisone daily.   Normocytic anemia Drop in hemoglobin is dilutional.  No evidence of overt bleeding.   Hyponatremia Stable  Patient is stable.  Okay for discharge home today.   PERTINENT LABS:  The results of significant diagnostics from this hospitalization (including imaging, microbiology, ancillary and laboratory) are listed below for reference.      Labs:   Basic Metabolic Panel: Recent Labs  Lab 05/04/23 1544 05/04/23 1631 05/05/23 0452  NA 140  --  134*  K 3.5  --  3.7  CL 106  --  99  CO2 23  --  26  GLUCOSE 163*  --  118*  BUN 14  --  17  CREATININE 0.72  --  0.97  CALCIUM 8.7*  --  8.9  MG  --  2.2 2.1  PHOS  --  2.5 5.1*  Liver Function Tests: Recent Labs  Lab 05/04/23 1544 05/04/23 1631 05/05/23 0452  AST 21 19 18   ALT 14 12 14   ALKPHOS 33* 37* 30*  BILITOT 0.4 0.8 0.6  PROT 7.2 7.9 6.2*  ALBUMIN 3.4* 3.6 2.9*    CBC: Recent Labs  Lab 05/04/23 1433 05/05/23 0452  WBC 6.0 5.9  NEUTROABS 3.9  --   HGB 11.6* 10.4*  HCT 38.3 34.1*  MCV 91.6 90.7  PLT 315 258   Cardiac Enzymes: Recent Labs  Lab 05/04/23 1631   CKTOTAL 54    CBG: Recent Labs  Lab 05/05/23 1611 05/05/23 2111 05/05/23 2137 05/06/23 0616 05/06/23 1122  GLUCAP 194* 55* 155* 118* 261*     IMAGING STUDIES MR Brain Wo Contrast (neuro protocol)  Result Date: 05/05/2023 CLINICAL DATA:  Acute neurologic deficit EXAM: MRI HEAD WITHOUT CONTRAST TECHNIQUE: Multiplanar, multiecho pulse sequences of the brain and surrounding structures were obtained without intravenous contrast. COMPARISON:  None Available. FINDINGS: Brain: No acute infarct, mass effect or extra-axial collection. No acute or chronic hemorrhage. There is multifocal hyperintense T2-weighted signal within the white matter. Parenchymal volume and CSF spaces are normal. The midline structures are normal. Vascular: Major flow voids are preserved. Skull and upper cervical spine: Normal calvarium and skull base. Visualized upper cervical spine and soft tissues are normal. Sinuses/Orbits:No paranasal sinus fluid levels or advanced mucosal thickening. No mastoid or middle ear effusion. Normal orbits. IMPRESSION: 1. No acute intracranial abnormality. 2. Findings of chronic small vessel ischemia. Electronically Signed   By: Deatra Robinson M.D.   On: 05/05/2023 19:12   DG Abd 1 View  Result Date: 05/04/2023 CLINICAL DATA:  Acute encephalopathy. EXAM: ABDOMEN - 1 VIEW COMPARISON:  Pelvic radiograph dated 03/02/2021. FINDINGS: No bowel dilatation or evidence of obstruction. No free air. Multiple surgical clips noted in the upper abdomen. Median sternotomy wires and CABG vascular clips. Degenerative changes of the spine. Bilateral total hip arthroplasties. No acute osseous pathology. IMPRESSION: Nonobstructive bowel gas pattern. Electronically Signed   By: Elgie Collard M.D.   On: 05/04/2023 22:27   CT Head Wo Contrast  Result Date: 05/04/2023 CLINICAL DATA:  Sudden onset headache, hypertension EXAM: CT HEAD WITHOUT CONTRAST TECHNIQUE: Contiguous axial images were obtained from the base of  the skull through the vertex without intravenous contrast. RADIATION DOSE REDUCTION: This exam was performed according to the departmental dose-optimization program which includes automated exposure control, adjustment of the mA and/or kV according to patient size and/or use of iterative reconstruction technique. COMPARISON:  10/19/2022 FINDINGS: Brain: No evidence of acute infarction, hemorrhage, hydrocephalus, extra-axial collection or mass lesion/mass effect. Mild periventricular white matter hypodensity. Vascular: No hyperdense vessel or unexpected calcification. Skull: Normal. Negative for fracture or focal lesion. Sinuses/Orbits: No acute finding. Status post left maxillary antrostomy with chronic, inspissated secretions, mucosal thickening, and bony sinus wall thickening. Other: None. IMPRESSION: 1. No acute intracranial pathology. Small-vessel white matter disease. 2. Status post left maxillary antrostomy with findings of chronic sinusitis. Electronically Signed   By: Jearld Lesch M.D.   On: 05/04/2023 15:09   ECHOCARDIOGRAM COMPLETE  Result Date: 04/14/2023    ECHOCARDIOGRAM REPORT   Patient Name:   ANAHLI ACCARDI  Date of Exam: 04/14/2023 Medical Rec #:  [id number]     Height:       63.0 in Accession #:    [id number]    Weight:       156.0 lb Date of Birth:  December 03, 1933    BSA:  1.740 m Patient Age:    33 years      BP:           160/87 mmHg Patient Gender: F             HR:           71 bpm. Exam Location:  [address] Procedure: 2D Echo, 3D Echo, Cardiac Doppler and Color Doppler Indications:    I50.32 CHF  History:        Patient has prior history of Echocardiogram examinations, most                 recent 03/18/2020. CAD, PAD; Risk Factors:Hypertension and                 Dyslipidemia.  Sonographer:    Clearence Ped RCS Referring Phys: [phone] JONATHAN J BERRY IMPRESSIONS  1. Left ventricular ejection fraction, by estimation, is 65 to 70%. The left ventricle has normal function. The left ventricle  has no regional wall motion abnormalities. There is mild asymmetric left ventricular hypertrophy of the septal segment. Left ventricular diastolic parameters are consistent with Grade I diastolic dysfunction (impaired relaxation).  2. Right ventricular systolic function is normal. The right ventricular size is normal. There is mildly elevated pulmonary artery systolic pressure.  3. Left atrial size was mildly dilated.  4. The mitral valve is normal in structure. No evidence of mitral valve regurgitation. No evidence of mitral stenosis.  5. The aortic valve is tricuspid. Aortic valve regurgitation is not visualized. No aortic stenosis is present.  6. The inferior vena cava is normal in size with greater than 50% respiratory variability, suggesting right atrial pressure of 3 mmHg. FINDINGS  Left Ventricle: Left ventricular ejection fraction, by estimation, is 65 to 70%. The left ventricle has normal function. The left ventricle has no regional wall motion abnormalities. The left ventricular internal cavity size was normal in size. There is  mild asymmetric left ventricular hypertrophy of the septal segment. Left ventricular diastolic parameters are consistent with Grade I diastolic dysfunction (impaired relaxation). Indeterminate filling pressures. Right Ventricle: The right ventricular size is normal. No increase in right ventricular wall thickness. Right ventricular systolic function is normal. There is mildly elevated pulmonary artery systolic pressure. The tricuspid regurgitant velocity is 2.90  m/s, and with an assumed right atrial pressure of 3 mmHg, the estimated right ventricular systolic pressure is 36.6 mmHg. Left Atrium: Left atrial size was mildly dilated. Right Atrium: Right atrial size was normal in size. Pericardium: There is no evidence of pericardial effusion. Mitral Valve: The mitral valve is normal in structure. No evidence of mitral valve regurgitation. No evidence of mitral valve stenosis.  Tricuspid Valve: The tricuspid valve is normal in structure. Tricuspid valve regurgitation is mild . No evidence of tricuspid stenosis. Aortic Valve: The aortic valve is tricuspid. Aortic valve regurgitation is not visualized. No aortic stenosis is present. Pulmonic Valve: The pulmonic valve was normal in structure. Pulmonic valve regurgitation is not visualized. No evidence of pulmonic stenosis. Aorta: The aortic root is normal in size and structure. Venous: The inferior vena cava is normal in size with greater than 50% respiratory variability, suggesting right atrial pressure of 3 mmHg. IAS/Shunts: No atrial level shunt detected by color flow Doppler.  LEFT VENTRICLE PLAX 2D LVIDd:         3.50 cm   Diastology LVIDs:         2.20 cm   LV e' medial:    7.62  cm/s LV PW:         0.90 cm   LV E/e' medial:  11.1 LV IVS:        1.20 cm   LV e' lateral:   6.85 cm/s LVOT diam:     1.80 cm   LV E/e' lateral: 12.3 LV SV:         64 LV SV Index:   37 LVOT Area:     2.54 cm                           3D Volume EF:                          3D EF:        55 %                          LV EDV:       101 ml                          LV ESV:       45 ml                          LV SV:        56 ml RIGHT VENTRICLE RV Basal diam:  3.00 cm RV S prime:     9.14 cm/s TAPSE (M-mode): 1.7 cm RVSP:           36.6 mmHg LEFT ATRIUM             Index        RIGHT ATRIUM           Index LA diam:        3.80 cm 2.18 cm/m   RA Pressure: 3.00 mmHg LA Vol (A2C):   88.7 ml 50.98 ml/m  RA Area:     12.30 cm LA Vol (A4C):   25.9 ml 14.89 ml/m  RA Volume:   26.80 ml  15.40 ml/m LA Biplane Vol: 52.3 ml 30.06 ml/m  AORTIC VALVE LVOT Vmax:   111.00 cm/s LVOT Vmean:  67.100 cm/s LVOT VTI:    0.250 m  AORTA Ao Root diam: 3.10 cm Ao Asc diam:  3.00 cm MITRAL VALVE                TRICUSPID VALVE MV Area (PHT):              TR Peak grad:   33.6 mmHg MV Decel Time:              TR Vmax:        290.00 cm/s MV E velocity: 84.40 cm/s   Estimated RAP:  3.00  mmHg MV A velocity: 105.00 cm/s  RVSP:           36.6 mmHg MV E/A ratio:  0.80                             SHUNTS                             Systemic VTI:  0.25 m  Systemic Diam: 1.80 cm Chilton Si MD Electronically signed by Chilton Si MD Signature Date/Time: 04/14/2023/1:28:00 PM    Final     DISCHARGE EXAMINATION: See progress note from earlier today   DISPOSITION: Home with home health  Discharge Instructions     Call MD for:  difficulty breathing, headache or visual disturbances   Complete by: As directed    Call MD for:  extreme fatigue   Complete by: As directed    Call MD for:  persistant dizziness or light-headedness   Complete by: As directed    Call MD for:  persistant nausea and vomiting   Complete by: As directed    Call MD for:  severe uncontrolled pain   Complete by: As directed    Call MD for:  temperature >100.4   Complete by: As directed    Diet - low sodium heart healthy   Complete by: As directed    Discharge instructions   Complete by: As directed    Take your medications as prescribed.  Please be sure to follow-up with your primary care provider.  Please check your blood pressures daily at home if possible. Please follow up with an ENT specialist for left ear problems.   You were cared for by a hospitalist during your hospital stay. If you have any questions about your discharge medications or the care you received while you were in the hospital after you are discharged, you can call the unit and asked to speak with the hospitalist on call if the hospitalist that took care of you is not available. Once you are discharged, your primary care physician will handle any further medical issues. Please note that NO REFILLS for any discharge medications will be authorized once you are discharged, as it is imperative that you return to your primary care physician (or establish a relationship with a primary care physician if you do not  have one) for your aftercare needs so that they can reassess your need for medications and monitor your lab values. If you do not have a primary care physician, you can call [phone] for a physician referral.   Discharge wound care:   Complete by: As directed    As before   Increase activity slowly   Complete by: As directed          Allergies as of 05/06/2023       Reactions   Fish Allergy Hives, Shortness Of Breath   Iodinated Contrast Media Shortness Of Breath, Anaphylaxis   Other reaction(s): Unknown   Latex Anaphylaxis, Hives, Shortness Of Breath, Itching, Other (See Comments)   REACTION: wheezing Other reaction(s): Unknown   Shellfish Allergy Anaphylaxis, Hives, Shortness Of Breath, Other (See Comments)   All seafood   Amlodipine Swelling   Evolocumab Other (See Comments)   Other reaction(s): latex on syringe Unknown reaction   Other Itching, Other (See Comments)   Patient reports allergy to perfumed detergents   Metformin Nausea And Vomiting        Medication List     STOP taking these medications    alendronate 70 MG tablet Commonly known as: FOSAMAX   furosemide 20 MG tablet Commonly known as: LASIX   pantoprazole 40 MG tablet Commonly known as: PROTONIX   sulfaSALAzine 500 MG EC tablet Commonly known as: AZULFIDINE       TAKE these medications    acetaminophen 650 MG CR tablet Commonly known as: TYLENOL Take 650 mg by mouth daily as needed for pain.  carvedilol 6.25 MG tablet Commonly known as: COREG TAKE 1 TABLET BY MOUTH TWICE A DAY What changed: when to take this   clopidogrel 75 MG tablet Commonly known as: PLAVIX Take 75 mg by mouth at bedtime.   denosumab 60 MG/ML Sosy injection Commonly known as: PROLIA Inject 60 mg into the skin every 6 (six) months.   ezetimibe 10 MG tablet Commonly known as: ZETIA Take 10 mg by mouth at bedtime.   famotidine 40 MG tablet Commonly known as: PEPCID Take 40 mg by mouth at bedtime.    folic acid 1 MG tablet Commonly known as: FOLVITE Take 1 mg by mouth daily.   gabapentin 400 MG capsule Commonly known as: NEURONTIN Take 400 mg by mouth 2 (two) times daily.   glimepiride 2 MG tablet Commonly known as: AMARYL Take 2 mg by mouth daily with breakfast.   hydrALAZINE 25 MG tablet Commonly known as: APRESOLINE Take 1 tablet (25 mg total) by mouth every 8 (eight) hours. What changed:  medication strength how much to take   isosorbide mononitrate 60 MG 24 hr tablet Commonly known as: IMDUR TAKE 1.5 TABLETS (90 MG TOTAL) BY MOUTH DAILY. Marland Kitchen What changed: how much to take   LORazepam 0.5 MG tablet Commonly known as: ATIVAN Take 0.5 mg by mouth daily as needed.   methotrexate 2.5 MG tablet Commonly known as: RHEUMATREX Take 20 mg by mouth every Sunday. (8 tablets)   nitroGLYCERIN 0.4 MG SL tablet Commonly known as: NITROSTAT PLACE 1 TABLET (0.4 MG TOTAL) UNDER THE TONGUE EVERY 5 (FIVE) MINUTES AS NEEDED FOR CHEST PAIN.   predniSONE 1 MG tablet Commonly known as: DELTASONE Take 4 mg by mouth daily.   rosuvastatin 40 MG tablet Commonly known as: CRESTOR TAKE 1 TABLET BY MOUTH EVERYDAY AT BEDTIME What changed: See the new instructions.   Vitamin D3 10 MCG (400 UNIT) tablet Take 400 Units by mouth daily.               Discharge Care Instructions  (From admission, onward)           Start     Ordered   05/06/23 0000  Discharge wound care:       Comments: As before   05/06/23 1129              Follow-up Information     Irena Reichmann, DO Follow up.   Specialty: Family Medicine Why: TIME : 10:00 AM DATE: WUJW [id number] Contact information: [address] [place] [address] [phone]         [facility] Follow up.   Specialty: Home Health Services Why: your Home Health agency they will call you to set up services Contact information: [address] [place] [address] [phone]          Newman Pies, MD Follow up.   Specialty: Otolaryngology Why: call to make appointment for left ear issues Contact information: [address] [place] [address] [phone]         [facility] Follow up.   Why: Medical equipment company they will call you for deliery of walker with seat                TOTAL DISCHARGE TIME: 35 minutes  Shoichi Mielke [employer] on [email or web address]  05/07/2023, 11:55 AM

## 2023-05-06 NOTE — Progress Notes (Signed)
[facility] PROGRESS NOTE   [name] ZOX:[id number] DOB: 1933/08/10 DOA: 05/04/2023  PCP: Irena Reichmann, DO  Brief History/Interval Summary: 89 y.o. female with medical history significant of HTN, HLD, CAD status post CABG, CHF, T2DM, PAD, peptic ulcer disease, presented with elevated blood pressure dizziness headache. On the arrival blood pressure of 260/130 patient is feeling dizzy and have a headache.  No associated chest pain or shortness of breath.  Patient has been dealing with significant stress recently due to her husband's surgery.  She however has been compliant with her antihypertensives.  She was started back on her antihypertensives in the emergency department.  Hospitalize for observation and further management.  Consultants: None  Procedures: None    Subjective/Interval History: Patient mentions that she has been having problems with her right ear over the past several weeks.  Denies any pain per se but feels that of the right ear is "stopped up".  Denies any ringing in the ear.  No discharge from the right ear.  She does feel that she may have some hearing impairment in that ear.     Assessment/Plan:  Hypertensive urgency Likely triggered by recent stressors in her life.  Denies any noncompliance.  Symptoms were due to elevated blood pressure.  No focal neurological deficits noted on examination.   She did have a significant drop in blood pressure yesterday but that has recovered.  She was started back on her antihypertensives including hydralazine isosorbide mononitrate and is also noted to be on carvedilol. Seen by physical therapy and noted to have orthostatic hypotension with which she was symptomatic.  Blood pressure medications were adjusted.  Dose of hydralazine was decreased.  This morning she is noted to be quite hypertensive.  To be given her medications.  We will see what her blood pressures do over the next several hours. Seen by PT and OT.  Home  health has been ordered.  Dizziness Could be having balance issues due to inner ear pathology.  Examination does not reveal any concerning findings.  She will need to be seen by ENT.  She is working on getting an appointment. MRI brain did not show any acute findings.  Dyslipidemia Continue Crestor.  History of coronary artery disease Stable.  Continue home medications.  Right leg wound No evidence for infection.  Outpatient management.  Peripheral artery disease Continue statin and antiplatelet agents.  Diabetes mellitus type 2, controlled Continue home medications at discharge. Low glucose level noted last night.  Improved this morning.  Chronic diastolic CHF Seems to be euvolemic.  Continue home medications at discharge.  Rheumatoid arthritis Continue prednisone daily.  Normocytic anemia Drop in hemoglobin is dilutional.  No evidence of overt bleeding.  Hyponatremia Stable  DVT Prophylaxis: SCDs Code Status: Full code Family Communication: Discussed with patient Disposition Plan: Possible discharge home later today she does better with physical therapy and if her blood pressure is improved.    Medications: Scheduled:  carvedilol  6.25 mg Oral BID WC   clopidogrel  75 mg Oral Daily   ezetimibe  10 mg Oral QHS   gabapentin  400 mg Oral BID   hydrALAZINE  25 mg Oral Q8H   insulin aspart  0-9 Units Subcutaneous TID WC   isosorbide mononitrate  60 mg Oral Daily   pantoprazole  40 mg Oral BID AC   predniSONE  5 mg Oral Q breakfast   rosuvastatin  40 mg Oral Daily   Continuous: WJX:BJYNWGNFAOZHY **OR** acetaminophen, HYDROcodone-acetaminophen, ondansetron **OR**  ondansetron (ZOFRAN) IV  Antibiotics: Anti-infectives (From admission, onward)    None       Objective:  Vital Signs  Vitals:   05/05/23 2315 05/05/23 2316 05/06/23 0332 05/06/23 0800  BP:  (!) 158/68  (!) 198/80  Pulse: (!) 56  (!) 53 (!) 56  Resp:  20  16  Temp:  98 F (36.7 C)  98 F  (36.7 C)  TempSrc:    Oral  SpO2: 96%  98% 96%  Weight:      Height:        Intake/Output Summary (Last 24 hours) at 05/06/2023 0941 Last data filed at 05/06/2023 0800 Gross per 24 hour  Intake 600 ml  Output 1425 ml  Net -825 ml    Filed Weights   05/04/23 1417  Weight: 68 kg    General appearance: Awake alert.  In no distress Resp: Clear to auscultation bilaterally.  Normal effort Cardio: S1-S2 is normal regular.  No S3-S4.  No rubs murmurs or bruit GI: Abdomen is soft.  Nontender nondistended.  Bowel sounds are present normal.  No masses organomegaly Extremities: No edema.  Full range of motion of lower extremities. Neurologic: Alert and oriented x3.  No focal neurological deficits.    Lab Results:  Data Reviewed: I have personally reviewed following labs and reports of the imaging studies  CBC: Recent Labs  Lab 05/04/23 1433 05/05/23 0452  WBC 6.0 5.9  NEUTROABS 3.9  --   HGB 11.6* 10.4*  HCT 38.3 34.1*  MCV 91.6 90.7  PLT 315 258     Basic Metabolic Panel: Recent Labs  Lab 05/04/23 1544 05/04/23 1631 05/05/23 0452  NA 140  --  134*  K 3.5  --  3.7  CL 106  --  99  CO2 23  --  26  GLUCOSE 163*  --  118*  BUN 14  --  17  CREATININE 0.72  --  0.97  CALCIUM 8.7*  --  8.9  MG  --  2.2 2.1  PHOS  --  2.5 5.1*     GFR: Estimated Creatinine Clearance: 37.8 mL/min (by C-G formula based on SCr of 0.97 mg/dL).  Liver Function Tests: Recent Labs  Lab 05/04/23 1544 05/04/23 1631 05/05/23 0452  AST 21 19 18   ALT 14 12 14   ALKPHOS 33* 37* 30*  BILITOT 0.4 0.8 0.6  PROT 7.2 7.9 6.2*  ALBUMIN 3.4* 3.6 2.9*      Coagulation Profile: Recent Labs  Lab 05/04/23 2015  INR 1.0     Cardiac Enzymes: Recent Labs  Lab 05/04/23 1631  CKTOTAL 54       CBG: Recent Labs  Lab 05/05/23 1119 05/05/23 1611 05/05/23 2111 05/05/23 2137 05/06/23 0616  GLUCAP 136* 194* 55* 155* 118*      Thyroid Function Tests: Recent Labs     05/04/23 1631  TSH 1.065      Radiology Studies: MR Brain Wo Contrast (neuro protocol)  Result Date: 05/05/2023 CLINICAL DATA:  Acute neurologic deficit EXAM: MRI HEAD WITHOUT CONTRAST TECHNIQUE: Multiplanar, multiecho pulse sequences of the brain and surrounding structures were obtained without intravenous contrast. COMPARISON:  None Available. FINDINGS: Brain: No acute infarct, mass effect or extra-axial collection. No acute or chronic hemorrhage. There is multifocal hyperintense T2-weighted signal within the white matter. Parenchymal volume and CSF spaces are normal. The midline structures are normal. Vascular: Major flow voids are preserved. Skull and upper cervical spine: Normal calvarium and skull base. Visualized upper cervical  spine and soft tissues are normal. Sinuses/Orbits:No paranasal sinus fluid levels or advanced mucosal thickening. No mastoid or middle ear effusion. Normal orbits. IMPRESSION: 1. No acute intracranial abnormality. 2. Findings of chronic small vessel ischemia. Electronically Signed   By: Deatra Robinson M.D.   On: 05/05/2023 19:12   DG Abd 1 View  Result Date: 05/04/2023 CLINICAL DATA:  Acute encephalopathy. EXAM: ABDOMEN - 1 VIEW COMPARISON:  Pelvic radiograph dated 03/02/2021. FINDINGS: No bowel dilatation or evidence of obstruction. No free air. Multiple surgical clips noted in the upper abdomen. Median sternotomy wires and CABG vascular clips. Degenerative changes of the spine. Bilateral total hip arthroplasties. No acute osseous pathology. IMPRESSION: Nonobstructive bowel gas pattern. Electronically Signed   By: Elgie Collard M.D.   On: 05/04/2023 22:27   CT Head Wo Contrast  Result Date: 05/04/2023 CLINICAL DATA:  Sudden onset headache, hypertension EXAM: CT HEAD WITHOUT CONTRAST TECHNIQUE: Contiguous axial images were obtained from the base of the skull through the vertex without intravenous contrast. RADIATION DOSE REDUCTION: This exam was performed according  to the departmental dose-optimization program which includes automated exposure control, adjustment of the mA and/or kV according to patient size and/or use of iterative reconstruction technique. COMPARISON:  10/19/2022 FINDINGS: Brain: No evidence of acute infarction, hemorrhage, hydrocephalus, extra-axial collection or mass lesion/mass effect. Mild periventricular white matter hypodensity. Vascular: No hyperdense vessel or unexpected calcification. Skull: Normal. Negative for fracture or focal lesion. Sinuses/Orbits: No acute finding. Status post left maxillary antrostomy with chronic, inspissated secretions, mucosal thickening, and bony sinus wall thickening. Other: None. IMPRESSION: 1. No acute intracranial pathology. Small-vessel white matter disease. 2. Status post left maxillary antrostomy with findings of chronic sinusitis. Electronically Signed   By: Jearld Lesch M.D.   On: 05/04/2023 15:09       LOS: 1 day   Osvaldo Shipper  [facility] Pager on [email or web address]  05/06/2023, 9:41 AM

## 2023-05-06 NOTE — Progress Notes (Signed)
Physical Therapy Treatment Patient Details Name: [name] MRN: [id number] DOB: Feb 14, 1933 Today's Date: 05/06/2023   History of Present Illness Pt is an 89 y.o. female who presented 05/04/23 with dizziness and HTN. CT of head negative, awaiting brain MRI. PMH: CAD s/p CABGx3, anemia, CHF, DM2, HTN, GERD, HLD, NSTEMI, PAD, RA, vertigo, R hip THA 2019    PT Comments    Pt's BP was more stable today and her lightheadedness was present but did not seem to drastically worsen throughout the session, allowing her to make good progress with mobility. She was able to perform transfers and ambulate in the halls with a rollator at a min guard assist level without LOB. She also was able to ambulate in the room with moments of no UE support and other times with x1 HHA, but displayed balance deficits that do place her at risk for falls. Educated pt on changing positions slowly, monitoring symptoms and sitting or laying supine as needed, performing LE exercises with positional changes, and waiting to walk until she stood for a little bit and ensured her symptoms were stable and safe. Encouraged pt to keep a phone on her at all times in case of emergency at home and of recs for daughter to stay and assist and ensure she is safe initially upon d/c home. Pt verbalized understanding of all, but reports her daughter needs to stay at her own home to manage the construction and she cannot stay with the daughter at this time. Will continue to follow acutely.  Vitals -  149/113 (126) & 57 bpm sitting in recliner start of session 150/123 (132) & 75 bpm standing 143/71 (94) & 72 bpm sitting after first gait bout 135/73 (93) & 77 bpm sitting end of session  SpO2 >/= 96% on RA ambulating when reporting SOB    Recommendations for follow up therapy are one component of a multi-disciplinary discharge planning process, led by the attending physician.  Recommendations may be updated based on patient status, additional  functional criteria and insurance authorization.  Follow Up Recommendations       Assistance Recommended at Discharge Intermittent Supervision/Assistance  Patient can return home with the following A little help with walking and/or transfers;A little help with bathing/dressing/bathroom;Assistance with cooking/housework;Assist for transportation;Help with stairs or ramp for entrance   Equipment Recommendations  Rollator (4 wheels)    Recommendations for Other Services       Precautions / Restrictions Precautions Precautions: Fall;Other (comment) Precaution Comments: watch BP (has been HTN and orthostatic this admission) Restrictions Weight Bearing Restrictions: No     Mobility  Bed Mobility               General bed mobility comments: Pt sitting in chair upon arrival    Transfers Overall transfer level: Needs assistance Equipment used: None, Rollator (4 wheels) Transfers: Sit to/from Stand Sit to Stand: Min guard           General transfer comment: Education and faded feedback cues provided for rollator brake management with transfers, min guard for safety with transfers from recliner, toilet, and rollator.    Ambulation/Gait Ambulation/Gait assistance: Min guard, Min assist Gait Distance (Feet): 40 Feet (x3 bouts of ~100 ft > ~140 ft > ~30 ft) Assistive device: 1 person hand held assist, None, Rollator (4 wheels) Gait Pattern/deviations: Step-through pattern, Decreased stride length Gait velocity: reduced Gait velocity interpretation: <1.31 ft/sec, indicative of household ambulator   General Gait Details: Pt taking slow, small steps with no LOB  when utilizing the rollator, min guard for safety. Pt stayed proximal to rollator as educated. Pt ambulating final bout in room with intermittent HHA minA and sometimes no UE support min guard assist. Slightly unsteady without UE support, but no LOB.   Stairs             Wheelchair Mobility    Modified  Rankin (Stroke Patients Only) Modified Rankin (Stroke Patients Only) Pre-Morbid Rankin Score: No symptoms Modified Rankin: Moderately severe disability     Balance Overall balance assessment: Needs assistance Sitting-balance support: No upper extremity supported, Feet supported Sitting balance-Leahy Scale: Good     Standing balance support: Single extremity supported, Bilateral upper extremity supported, No upper extremity supported, During functional activity Standing balance-Leahy Scale: Fair Standing balance comment: Able to walk short distances without UE support but benefits from it                            Cognition Arousal/Alertness: Awake/alert Behavior During Therapy: WFL for tasks assessed/performed Overall Cognitive Status: Within Functional Limits for tasks assessed                                          Exercises      General Comments General comments (skin integrity, edema, etc.): Vitals - 149/113 (126) & 57 bpm sitting in recliner start of session, 150/123 (132) & 75 bpm standing, 143/71 (94) & 72 bpm sitting after first gait bout, 135/73 (93) & 77 bpm sitting end of session, SpO2 >/= 96% on RA ambulating when reporting SOB, reports some lightheadedness throughout session but did not seem to drastically worsen at any point; educated pt on changing positions slowly, monitoring symptoms and sitting or laying supine as needed, perfroming LE exercises with positional changes, and waiting to walk until she stood for a little bit and ensured her symptoms were stable and safe      Pertinent Vitals/Pain Pain Assessment Pain Assessment: Faces Faces Pain Scale: Hurts little more Pain Location: headache Pain Descriptors / Indicators: Headache Pain Intervention(s): Limited activity within patient's tolerance, Monitored during session, Repositioned, Patient requesting pain meds-RN notified    Home Living Family/patient expects to be discharged  to:: Private residence Living Arrangements: Alone Available Help at Discharge: Family;Available PRN/intermittently Type of Home: House Home Access: Ramped entrance       Home Layout: One level Home Equipment: Cane - single point;Shower seat      Prior Function            PT Goals (current goals can now be found in the care plan section) Acute Rehab PT Goals Patient Stated Goal: to improve and go home PT Goal Formulation: With patient Time For Goal Achievement: 05/19/23 Potential to Achieve Goals: Good Progress towards PT goals: Progressing toward goals    Frequency    Min 3X/week      PT Plan Current plan remains appropriate    Co-evaluation              AM-PAC PT "6 Clicks" Mobility   Outcome Measure  Help needed turning from your back to your side while in a flat bed without using bedrails?: A Little Help needed moving from lying on your back to sitting on the side of a flat bed without using bedrails?: A Little Help needed moving to and from a bed to a  chair (including a wheelchair)?: A Little Help needed standing up from a chair using your arms (e.g., wheelchair or bedside chair)?: A Little Help needed to walk in hospital room?: A Little Help needed climbing 3-5 steps with a railing? : Total 6 Click Score: 16    End of Session Equipment Utilized During Treatment: Gait belt Activity Tolerance: Patient tolerated treatment well Patient left: with call bell/phone within reach;in chair;with chair alarm set Nurse Communication: Mobility status;Other (comment) (vitals) PT Visit Diagnosis: Unsteadiness on feet (R26.81);Other abnormalities of gait and mobility (R26.89);Muscle weakness (generalized) (M62.81);Difficulty in walking, not elsewhere classified (R26.2);Dizziness and giddiness (R42)     Time: 1610-9604 PT Time Calculation (min) (ACUTE ONLY): 32 min  Charges:  $Gait Training: 8-22 mins $Therapeutic Activity: 8-22 mins                     Raymond Gurney, PT, DPT Acute Rehabilitation Services  Office: [phone]    Jewel Baize 05/06/2023, 12:13 PM

## 2023-05-06 NOTE — TOC Transition Note (Signed)
Transition of Care [facility]) - CM/SW Discharge Note   Patient Details  Name: [name] MRN: [id number] Date of Birth: Mar 29, 1933  [facility] CM/SW Contact:  Lockie Pares, RN Phone Number: 05/06/2023, 1:17 PM   Clinical Narrative:    Patient discharged, spoke to patient via phone and ordered 4 wheeled walker with  seat from [employer] to be delivered to her home.  No other needs ID  Final next level of care: Home w Home Health Services Barriers to Discharge: No Barriers Identified   Patient Goals and CMS Choice   Choice offered to / list presented to : Patient  Discharge Placement  Home with home health and DME                       Discharge Plan and Services Additional resources added to the After Visit Summary for       Post Acute Care Choice: Home Health          DME Arranged: Walker rolling with seat DME Agency: [employer] Date DME Agency Contacted: 05/06/23 Time DME Agency Contacted: 1315 Representative spoke with at DME Agency: Vaughan Basta HH Arranged: PT, OT HH Agency: [facility] Date [facility] Agency Contacted: 05/05/23 Time HH Agency Contacted: 1208 Representative spoke with at [facility] Agency: Tresa Endo  Social Determinants of Health (SDOH) Interventions SDOH Screenings   Food Insecurity: No Food Insecurity (05/04/2023)  Housing: Low Risk  (05/04/2023)  Transportation Needs: No Transportation Needs (05/04/2023)  Utilities: Not At Risk (05/04/2023)  Tobacco Use: Low Risk  (05/04/2023)     Readmission Risk Interventions     No data to display

## 2023-05-06 NOTE — Plan of Care (Signed)
  Problem: Education: Goal: Ability to describe self-care measures that may prevent or decrease complications (Diabetes Survival Skills Education) will improve Outcome: Adequate for Discharge Goal: Individualized Educational Video(s) 05/06/2023 1430 by Jennye Moccasin, RN Outcome: Adequate for Discharge 05/06/2023 1430 by Jennye Moccasin, RN Outcome: Progressing   Problem: Coping: Goal: Ability to adjust to condition or change in health will improve 05/06/2023 1430 by Jennye Moccasin, RN Outcome: Adequate for Discharge 05/06/2023 1430 by Jennye Moccasin, RN Outcome: Progressing   Problem: Fluid Volume: Goal: Ability to maintain a balanced intake and output will improve 05/06/2023 1430 by Nasiah Lehenbauer C, RN Outcome: Adequate for Discharge 05/06/2023 1430 by Kalene Cutler, Desiree Lucy, RN Outcome: Progressing   Problem: Health Behavior/Discharge Planning: Goal: Ability to identify and utilize available resources and services will improve Outcome: Adequate for Discharge Goal: Ability to manage health-related needs will improve Outcome: Adequate for Discharge   Problem: Metabolic: Goal: Ability to maintain appropriate glucose levels will improve Outcome: Adequate for Discharge   Problem: Nutritional: Goal: Maintenance of adequate nutrition will improve Outcome: Adequate for Discharge Goal: Progress toward achieving an optimal weight will improve Outcome: Adequate for Discharge   Problem: Skin Integrity: Goal: Risk for impaired skin integrity will decrease Outcome: Adequate for Discharge   Problem: Tissue Perfusion: Goal: Adequacy of tissue perfusion will improve Outcome: Adequate for Discharge   Problem: Education: Goal: Knowledge of General Education information will improve Description: Including pain rating scale, medication(s)/side effects and non-pharmacologic comfort measures Outcome: Adequate for Discharge   Problem: Health Behavior/Discharge  Planning: Goal: Ability to manage health-related needs will improve Outcome: Adequate for Discharge   Problem: Clinical Measurements: Goal: Ability to maintain clinical measurements within normal limits will improve Outcome: Adequate for Discharge Goal: Will remain free from infection Outcome: Adequate for Discharge Goal: Diagnostic test results will improve Outcome: Adequate for Discharge Goal: Respiratory complications will improve Outcome: Adequate for Discharge Goal: Cardiovascular complication will be avoided Outcome: Adequate for Discharge   Problem: Activity: Goal: Risk for activity intolerance will decrease Outcome: Adequate for Discharge   Problem: Nutrition: Goal: Adequate nutrition will be maintained Outcome: Adequate for Discharge   Problem: Coping: Goal: Level of anxiety will decrease Outcome: Adequate for Discharge   Problem: Elimination: Goal: Will not experience complications related to bowel motility Outcome: Adequate for Discharge Goal: Will not experience complications related to urinary retention Outcome: Adequate for Discharge   Problem: Pain Managment: Goal: General experience of comfort will improve Outcome: Adequate for Discharge   Problem: Safety: Goal: Ability to remain free from injury will improve Outcome: Adequate for Discharge   Problem: Skin Integrity: Goal: Risk for impaired skin integrity will decrease Outcome: Adequate for Discharge

## 2023-05-06 NOTE — Evaluation (Signed)
Occupational Therapy Evaluation Patient Details Name: [name] MRN: [id number] DOB: [birth year] Today's Date: 05/06/2023   History of Present Illness Pt is an 89 y.o. female who presented 05/04/23 with dizziness and HTN. CT of head negative, MRI (-). PMH: CAD s/p CABGx3, anemia, CHF, DM2, HTN, GERD, HLD, NSTEMI, PAD, RA, vertigo, R hip THA 2019   Clinical Impression   PT admitted with dizziness and HTN. Pt currently with functional limitiations due to the deficits listed below (see OT problem list). Pt lives alone and indep with adls at baseline. Pt this session noted to have a sustain Bp 207/107 with dizziness. Rn present and providing medications. Pt with mild balance deficits. Educated on sitting for adls Pt will benefit from skilled OT to increase their independence and safety with adls and balance to allow discharge hhot.       Recommendations for follow up therapy are one component of a multi-disciplinary discharge planning process, led by the attending physician.  Recommendations may be updated based on patient status, additional functional criteria and insurance authorization.   Assistance Recommended at Discharge Set up Supervision/Assistance  Patient can return home with the following A little help with walking and/or transfers;A little help with bathing/dressing/bathroom    Functional Status Assessment  Patient has had a recent decline in their functional status and demonstrates the ability to make significant improvements in function in a reasonable and predictable amount of time.  Equipment Recommendations  BSC/3in1    Recommendations for Other Services       Precautions / Restrictions Precautions Precautions: Fall;Other (comment) Precaution Comments: watch BP hypertensive (207/107 with OT)      Mobility Bed Mobility               General bed mobility comments: oob on arrival    Transfers Overall transfer level: Needs assistance Equipment used: 1 person  hand held assist Transfers: Sit to/from Stand Sit to Stand: Min guard           General transfer comment: heavy use of hand on 3n1 arm rest      Balance Overall balance assessment: Needs assistance Sitting-balance support: No upper extremity supported, Feet supported Sitting balance-Leahy Scale: Good     Standing balance support: Single extremity supported, During functional activity, Reliant on assistive device for balance Standing balance-Leahy Scale: Fair                             ADL either performed or assessed with clinical judgement   ADL Overall ADL's : Needs assistance/impaired Eating/Feeding: Modified independent   Grooming: Oral care;Set up;Standing Grooming Details (indicate cue type and reason): reports dizziness noted to have BP 207/107 Upper Body Bathing: Set up;Sitting Upper Body Bathing Details (indicate cue type and reason): able to take sponge bath sitting on 3n1 Lower Body Bathing: Minimal assistance;Sit to/from stand Lower Body Bathing Details (indicate cue type and reason): steady A with standing to do peri care Upper Body Dressing : Supervision/safety   Lower Body Dressing: Minimal assistance Lower Body Dressing Details (indicate cue type and reason): (A) to thread mesh panties Toilet Transfer: Minimal assistance   Toileting- Clothing Manipulation and Hygiene: Minimal assistance       Functional mobility during ADLs: Minimal assistance       Vision Baseline Vision/History: 0 No visual deficits       Perception     Praxis      Pertinent Vitals/Pain Pain Assessment Pain Assessment:  No/denies pain     Hand Dominance Right   Extremity/Trunk Assessment Upper Extremity Assessment Upper Extremity Assessment: Overall WFL for tasks assessed   Lower Extremity Assessment Lower Extremity Assessment: Generalized weakness   Cervical / Trunk Assessment Cervical / Trunk Assessment: Kyphotic   Communication  Communication Communication: No difficulties   Cognition Arousal/Alertness: Awake/alert Behavior During Therapy: WFL for tasks assessed/performed Overall Cognitive Status: Within Functional Limits for tasks assessed                                       General Comments  BP 207/107 x2 in session. RN present and giving medications    Exercises     Shoulder Instructions      Home Living Family/patient expects to be discharged to:: Private residence Living Arrangements: Alone Available Help at Discharge: Family;Available PRN/intermittently Type of Home: House Home Access: Ramped entrance     Home Layout: One level     Bathroom Shower/Tub: Chief Strategy Officer: Standard Bathroom Accessibility: Yes   Home Equipment: Cane - single point;Shower seat          Prior Functioning/Environment Prior Level of Function : Independent/Modified Independent             Mobility Comments: Does not typically use an AD, intermittently will use SPC as needed though ADLs Comments: Reports difficulty getting in and out of shower and plans to renovate to a walk-in shower        OT Problem List: Decreased strength;Decreased activity tolerance;Impaired balance (sitting and/or standing);Decreased safety awareness;Cardiopulmonary status limiting activity;Decreased knowledge of use of DME or AE      OT Treatment/Interventions: Self-care/ADL training;Therapeutic exercise;Energy conservation;DME and/or AE instruction;Manual therapy;Modalities;Therapeutic activities;Patient/family education;Balance training    OT Goals(Current goals can be found in the care plan section) Acute Rehab OT Goals Patient Stated Goal: to get better OT Goal Formulation: With patient Time For Goal Achievement: 05/20/23 Potential to Achieve Goals: Good  OT Frequency: Min 2X/week    Co-evaluation              AM-PAC OT "6 Clicks" Daily Activity     Outcome Measure Help from  another person eating meals?: A Little Help from another person taking care of personal grooming?: A Little Help from another person toileting, which includes using toliet, bedpan, or urinal?: A Little Help from another person bathing (including washing, rinsing, drying)?: A Little Help from another person to put on and taking off regular upper body clothing?: A Little Help from another person to put on and taking off regular lower body clothing?: A Little 6 Click Score: 18   End of Session Nurse Communication: Mobility status;Precautions  Activity Tolerance: Patient tolerated treatment well Patient left: in chair;with call bell/phone within reach;with chair alarm set;with nursing/sitter in room  OT Visit Diagnosis: Unsteadiness on feet (R26.81);Muscle weakness (generalized) (M62.81)                Time: 1610-9604 OT Time Calculation (min): 23 min Charges:  OT General Charges $OT Visit: 1 Visit OT Evaluation $OT Eval Moderate Complexity: 1 Mod   Brynn, OTR/L  Acute Rehabilitation Services Office: [phone] .   Mateo Flow 05/06/2023, 9:21 AM

## 2023-06-03 ENCOUNTER — Other Ambulatory Visit: Payer: Self-pay | Admitting: Cardiovascular Disease

## 2023-06-13 ENCOUNTER — Encounter: Payer: Self-pay | Admitting: Podiatry

## 2023-06-13 ENCOUNTER — Ambulatory Visit: Payer: Medicare Other | Admitting: Podiatry

## 2023-06-13 DIAGNOSIS — I739 Peripheral vascular disease, unspecified: Secondary | ICD-10-CM

## 2023-06-13 DIAGNOSIS — E1151 Type 2 diabetes mellitus with diabetic peripheral angiopathy without gangrene: Secondary | ICD-10-CM

## 2023-06-13 DIAGNOSIS — M79675 Pain in left toe(s): Secondary | ICD-10-CM

## 2023-06-13 DIAGNOSIS — L608 Other nail disorders: Secondary | ICD-10-CM | POA: Diagnosis not present

## 2023-06-13 DIAGNOSIS — B351 Tinea unguium: Secondary | ICD-10-CM

## 2023-06-13 DIAGNOSIS — M79674 Pain in right toe(s): Secondary | ICD-10-CM

## 2023-06-13 NOTE — Progress Notes (Addendum)
This patient returns to my office for at risk foot care.  This patient requires this care by a professional since this patient will be at risk due to having diabetes,  PAD  and coagulation defect.  Patient is taking plavix. Patient says she has painful long thick nails especially her big toenail right foot.   This patient is unable to cut nails herself since the patient cannot reach her nails.These nails are painful walking and wearing shoes.  This patient presents for at risk foot care today. Patient has not been seen in over 9 months.  General Appearance  Alert, conversant and in no acute stress.  Vascular  Dorsalis pedis and posterior tibial  pulses are not  palpable  bilaterally. Absent digital hair  B/L.  Capillary return is within normal limits  bilaterally. Temperature is within normal limits  bilaterally. Venous stasis  B/l.  Neurologic  Senn-Weinstein monofilament wire test within normal limits  bilaterally. Muscle power within normal limits bilaterally.  Nails Thick disfigured discolored nails with subungual debris  from hallux to fifth toes bilaterally. No evidence of bacterial infection or drainage bilaterally.  Right pincer nail hallux.  Orthopedic  No limitations of motion  feet .  No crepitus or effusions noted.  No bony pathology or digital deformities noted.  Skin  normotropic skin with no porokeratosis noted bilaterally.  No signs of infections or ulcers noted.     Onychomycosis  Pain in right toes  Pain in left toes    Consent was obtained for treatment procedures.   Mechanical debridement of nails 1-5  bilaterally performed with a nail nipper.  Filed with dremel without incident.  Right hallux was anesthetized before treatment.   Return office visit    3 months                 Told patient to return for periodic foot care and evaluation due to potential at risk complications.   Helane Gunther DPM

## 2023-06-13 NOTE — Progress Notes (Signed)
LANDRI, STELLO [place] ([id number]) [id number] Page 1 of 12 Visit Report for 04/28/2023 Chief Complaint Document Details Patient Name: Date of Service: [name], [place] M. 04/28/2023 12:45 PM Medical Record Number: [id number] Patient Account Number: [id number] Date of Birth/Sex: Treating RN: 1933/03/08 (89 y.o. F) Primary Care Provider: Irena Reichmann Other Clinician: Referring Provider: Treating Provider/Extender: Jenne Pane in Treatment: 0 Information Obtained from: Patient Chief Complaint 04/28/2023; bilateral lower extremity wounds Electronic Signature(s) Signed: 04/28/2023 4:21:44 PM By: Geralyn Corwin DO Entered By: Geralyn Corwin on 04/28/2023 14:47:00 -------------------------------------------------------------------------------- Debridement Details Patient Name: Date of Service: Monica Almond, MA RY M. 04/28/2023 12:45 PM Medical Record Number: [id number] Patient Account Number: [id number] Date of Birth/Sex: Treating RN: September 25, 1933 (89 y.o. [name] Primary Care Provider: Irena Reichmann Other Clinician: Referring Provider: Treating Provider/Extender: Jenne Pane in Treatment: 0 Debridement Performed for Assessment: Wound #3 Left,Lateral Lower Leg Performed By: Physician Geralyn Corwin, DO Debridement Type: Debridement Severity of Tissue Pre Debridement: Fat layer exposed Level of Consciousness (Pre-procedure): Awake and Alert Pre-procedure Verification/Time Out Yes - 14:00 Taken: Start Time: 14:07 Pain Control: Lidocaine 4% Topical Solution Percent of Wound Bed Debrided: 100% T Area Debrided (cm): otal 3.14 Tissue and other material debrided: Non-Viable, Skin: Dermis , Skin: Epidermis Level: Skin/Epidermis Debridement Description: Selective/Open Wound Instrument: Forceps, Scissors Bleeding: Minimum Hemostasis Achieved: Pressure Response to Treatment: Procedure was tolerated well Level of  Consciousness (Post- Awake and Alert procedure): Post Debridement Measurements of Total Wound Length: (cm) 2 Width: (cm) 2 Depth: (cm) 0.1 Volume: (cm) 0.314 Character of Wound/Ulcer Post Debridement: Improved Severity of Tissue Post Debridement: Fat layer exposed Post Procedure Diagnosis [name], [name] ([id number]) [id number] Page 2 of 12 Same as Pre-procedure Notes Scribed for Dr Mikey Bussing by Redmond Pulling, RN Electronic Signature(s) Signed: 04/28/2023 4:21:44 PM By: Geralyn Corwin DO Signed: 05/03/2023 4:26:34 PM By: Redmond Pulling RN, BSN Entered By: Redmond Pulling on 04/28/2023 14:08:32 -------------------------------------------------------------------------------- Debridement Details Patient Name: Date of Service: Monica Almond, MA RY M. 04/28/2023 12:45 PM Medical Record Number: [id number] Patient Account Number: [id number] Date of Birth/Sex: Treating RN: 08-15-1933 (89 y.o. [name] Primary Care Provider: Irena Reichmann Other Clinician: Referring Provider: Treating Provider/Extender: Jenne Pane in Treatment: 0 Debridement Performed for Assessment: Wound #1 Right Lower Leg Performed By: Physician Geralyn Corwin, DO Debridement Type: Debridement Severity of Tissue Pre Debridement: Fat layer exposed Level of Consciousness (Pre-procedure): Awake and Alert Pre-procedure Verification/Time Out Yes - 14:00 Taken: Start Time: 14:07 Pain Control: Lidocaine 4% Topical Solution Percent of Wound Bed Debrided: 100% T Area Debrided (cm): otal 18.84 Tissue and other material debrided: Non-Viable, Skin: Dermis , Skin: Epidermis Level: Skin/Epidermis Debridement Description: Selective/Open Wound Instrument: Forceps, Scissors Bleeding: Minimum Hemostasis Achieved: Pressure Response to Treatment: Procedure was tolerated well Level of Consciousness (Post- Awake and Alert procedure): Post Debridement Measurements of Total  Wound Length: (cm) 4 Width: (cm) 6 Depth: (cm) 0.1 Volume: (cm) 1.885 Character of Wound/Ulcer Post Debridement: Improved Severity of Tissue Post Debridement: Fat layer exposed Post Procedure Diagnosis Same as Pre-procedure Notes Scribed for Dr Mikey Bussing by Redmond Pulling, RN Electronic Signature(s) Signed: 04/28/2023 4:21:44 PM By: Geralyn Corwin DO Signed: 05/03/2023 4:26:34 PM By: Redmond Pulling RN, BSN Entered By: Redmond Pulling on 04/28/2023 14:09:10 [name] ([id number]) [id number] Page 3 of 12 -------------------------------------------------------------------------------- Debridement Details Patient Name: Date of Service: [name], [place] M. 04/28/2023 12:45 PM Medical Record Number: [id number] Patient Account Number: [id number] Date of  Birth/Sex: Treating RN: April 05, 1933 (89 y.o. [name] Primary Care Provider: Irena Reichmann Other Clinician: Referring Provider: Treating Provider/Extender: Jenne Pane in Treatment: 0 Debridement Performed for Assessment: Wound #2 Left,Medial Lower Leg Performed By: Physician Geralyn Corwin, DO Debridement Type: Debridement Severity of Tissue Pre Debridement: Fat layer exposed Level of Consciousness (Pre-procedure): Awake and Alert Pre-procedure Verification/Time Out Yes - 14:00 Taken: Start Time: 14:07 Pain Control: Lidocaine 4% Topical Solution Percent of Wound Bed Debrided: 100% T Area Debrided (cm): otal 9.42 Tissue and other material debrided: Non-Viable, Skin: Dermis , Skin: Epidermis Level: Skin/Epidermis Debridement Description: Selective/Open Wound Instrument: Forceps, Scissors Bleeding: Minimum Hemostasis Achieved: Pressure Response to Treatment: Procedure was tolerated well Level of Consciousness (Post- Awake and Alert procedure): Post Debridement Measurements of Total Wound Length: (cm) 4 Width: (cm) 3 Depth: (cm) 0.1 Volume: (cm) 0.942 Character of  Wound/Ulcer Post Debridement: Improved Severity of Tissue Post Debridement: Fat layer exposed Post Procedure Diagnosis Same as Pre-procedure Notes Scribed for Dr Mikey Bussing by Redmond Pulling, RN Electronic Signature(s) Signed: 04/28/2023 4:21:44 PM By: Geralyn Corwin DO Signed: 05/03/2023 4:26:34 PM By: Redmond Pulling RN, BSN Entered By: Redmond Pulling on 04/28/2023 14:11:31 -------------------------------------------------------------------------------- HPI Details Patient Name: Date of Service: Monica Almond, MA RY M. 04/28/2023 12:45 PM Medical Record Number: [id number] Patient Account Number: [id number] Date of Birth/Sex: Treating RN: 1933-05-19 (89 y.o. F) Primary Care Provider: Irena Reichmann Other Clinician: Referring Provider: Treating Provider/Extender: Jenne Pane in Treatment: 0 History of Present Illness HPI Description: 04/28/2023 Ms. Clydell Haberle is an 89 year old female with a past medical history of severe PAD, essential hypertension, chronic diastolic heart failure and venous insufficiency that presents to the clinic for bilateral lower extremity wounds. She states that 1 month ago she developed blisters to her legs bilaterally. She is wearing compression stockings but decided after she developed the blisters to stop wearing them. She has been keeping the areas covered. She currently denies signs of infection. She is a fairly active individual and lives with her husband. She does not require assistance for her daily activities. [name], [name] [place] ([id number]) [id number] Page 4 of 12 Electronic Signature(s) Signed: 04/28/2023 4:21:44 PM By: Geralyn Corwin DO Entered By: Geralyn Corwin on 04/28/2023 14:48:34 -------------------------------------------------------------------------------- Physical Exam Details Patient Name: Date of Service: [name], [place] M. 04/28/2023 12:45 PM Medical Record Number: [id number] Patient Account  Number: [id number] Date of Birth/Sex: Treating RN: 1933-06-05 (89 y.o. F) Primary Care Provider: Irena Reichmann Other Clinician: Referring Provider: Treating Provider/Extender: Jenne Pane in Treatment: 0 Constitutional respirations regular, non-labored and within target range for patient.. Cardiovascular 2+ dorsalis pedis/posterior tibialis pulses. Psychiatric pleasant and cooperative. Notes Superficial open blisters to the legs bilaterally. No signs of infection. 2+ pitting edema to the knee. Electronic Signature(s) Signed: 04/28/2023 4:21:44 PM By: Geralyn Corwin DO Entered By: Geralyn Corwin on 04/28/2023 14:49:05 -------------------------------------------------------------------------------- Physician Orders Details Patient Name: Date of Service: Monica Almond, MA RY M. 04/28/2023 12:45 PM Medical Record Number: [id number] Patient Account Number: [id number] Date of Birth/Sex: Treating RN: [birth year] (89 y.o. [name] Primary Care Provider: Irena Reichmann Other Clinician: Referring Provider: Treating Provider/Extender: Jenne Pane in Treatment: 0 Verbal / Phone Orders: No Diagnosis Coding ICD-10 Coding Code Description I87.313 Chronic venous hypertension (idiopathic) with ulcer of bilateral lower extremity L97.812 Non-pressure chronic ulcer of other part of right lower leg with fat layer exposed L97.822 Non-pressure chronic ulcer of other part of left lower leg with  fat layer exposed I73.9 Peripheral vascular disease, unspecified I50.32 Chronic diastolic (congestive) heart failure Follow-up Appointments ppointment in 1 week. - Dr Mikey Bussing - over flow Tuesday 05/03/23 @ 0900 Return A Anesthetic Wound #1 Right Lower Leg (In clinic) Topical Lidocaine 4% applied to wound bed Wound #2 Left,Medial Lower Leg Monica, Flores ([id number]) [id number] Page 5 of 12 (In clinic) Topical Lidocaine 4%  applied to wound bed Wound #3 Left,Lateral Lower Leg (In clinic) Topical Lidocaine 4% applied to wound bed Bathing/ Shower/ Hygiene May shower with protection but do not get wound dressing(s) wet. Protect dressing(s) with water repellant cover (for example, large plastic bag) or a cast cover and may then take shower. Edema Control - Lymphedema / SCD / Other Bilateral Lower Extremities Elevate legs to the level of the heart or above for 30 minutes daily and/or when sitting for 3-4 times a day throughout the day. Avoid standing for long periods of time. Patient to wear own compression stockings every day. - Tubigrip Moisturize legs daily. Wound Treatment Wound #1 - Lower Leg Wound Laterality: Right Cleanser: Soap and Water 2 x Per Week/15 Days Discharge Instructions: May shower and wash wound with dial antibacterial soap and water prior to dressing change. Prim Dressing: Xeroform Occlusive Gauze Dressing, 4x4 in 2 x Per Week/15 Days ary Discharge Instructions: Apply to wound bed as instructed Secondary Dressing: Zetuvit Plus Silicone Border Dressing 5x5 (in/in) 2 x Per Week/15 Days Discharge Instructions: Apply silicone border over primary dressing as directed. Secured With: Tubigrip Size D, 3x10 (in/yd) 2 x Per Week/15 Days Wound #2 - Lower Leg Wound Laterality: Left, Medial Cleanser: Soap and Water 2 x Per Week/15 Days Discharge Instructions: May shower and wash wound with dial antibacterial soap and water prior to dressing change. Prim Dressing: Xeroform Occlusive Gauze Dressing, 4x4 in 2 x Per Week/15 Days ary Discharge Instructions: Apply to wound bed as instructed Secondary Dressing: Zetuvit Plus Silicone Border Dressing 5x5 (in/in) 2 x Per Week/15 Days Discharge Instructions: Apply silicone border over primary dressing as directed. Secured With: Tubigrip Size D, 3x10 (in/yd) 2 x Per Week/15 Days Wound #3 - Lower Leg Wound Laterality: Left, Lateral Cleanser: Soap and Water 2 x Per  Week/15 Days Discharge Instructions: May shower and wash wound with dial antibacterial soap and water prior to dressing change. Prim Dressing: Xeroform Occlusive Gauze Dressing, 4x4 in 2 x Per Week/15 Days ary Discharge Instructions: Apply to wound bed as instructed Secondary Dressing: Zetuvit Plus Silicone Border Dressing 5x5 (in/in) 2 x Per Week/15 Days Discharge Instructions: Apply silicone border over primary dressing as directed. Secured With: Tubigrip Size D, 3x10 (in/yd) 2 x Per Week/15 Days Patient Medications llergies: Fish Containing Products, Iodinated Contrast Media, latex, Shellfish Containing Products, amlodipine, evolocumab, metformin A Notifications Medication Indication Start End 04/28/2023 lidocaine DOSE topical 4 % cream - cream topical once daily Electronic Signature(s) Signed: 04/28/2023 4:21:44 PM By: Geralyn Corwin DO Entered By: Geralyn Corwin on 04/28/2023 14:49:40 [name] ([id number]) [id number] Page 6 of 12 -------------------------------------------------------------------------------- Problem List Details Patient Name: Date of Service: [name], [place] M. 04/28/2023 12:45 PM Medical Record Number: [id number] Patient Account Number: [id number] Date of Birth/Sex: Treating RN: 29-May-1933 (89 y.o. F) Primary Care Provider: Irena Reichmann Other Clinician: Referring Provider: Treating Provider/Extender: Jenne Pane in Treatment: 0 Active Problems ICD-10 Encounter Code Description Active Date MDM Diagnosis L97.821 Non-pressure chronic ulcer of other part of left lower leg limited to breakdown 04/28/2023 No Yes of skin L97.811  Non-pressure chronic ulcer of other part of right lower leg limited to breakdown 04/28/2023 No Yes of skin I87.313 Chronic venous hypertension (idiopathic) with ulcer of bilateral lower extremity 04/28/2023 No Yes I73.9 Peripheral vascular disease, unspecified 04/28/2023 No  Yes I50.32 Chronic diastolic (congestive) heart failure 04/28/2023 No Yes Inactive Problems Resolved Problems Electronic Signature(s) Signed: 04/28/2023 4:21:44 PM By: Geralyn Corwin DO Entered By: Geralyn Corwin on 04/28/2023 14:52:16 -------------------------------------------------------------------------------- Progress Note Details Patient Name: Date of Service: Monica Almond, MA RY M. 04/28/2023 12:45 PM Medical Record Number: [id number] Patient Account Number: [id number] Date of Birth/Sex: Treating RN: 11/11/1933 (89 y.o. F) Primary Care Provider: Irena Reichmann Other Clinician: Referring Provider: Treating Provider/Extender: Jenne Pane in Treatment: 0 Subjective Chief Complaint Information obtained from Patient 04/28/2023; bilateral lower extremity wounds History of Present Illness (HPI) 04/28/2023 Ms. Aurilla Quinney is an 89 year old female with a past medical history of severe PAD, essential hypertension, chronic diastolic heart failure and venous insufficiency that presents to the clinic for bilateral lower extremity wounds. She states that 1 month ago she developed blisters to her legs bilaterally. She is wearing compression stockings but decided after she developed the blisters to stop wearing them. She has been keeping the areas covered. She currently denies signs of infection. She is a fairly active individual and lives with her husband. She does not require assistance for her daily activities. Monica, Flores [place] ([id number]) [id number] Page 7 of 12 Patient History Information obtained from Patient. Allergies Fish Containing Products (Severity: Severe, Reaction: hives, SOB), Iodinated Contrast Media (Severity: Severe, Reaction: anaphylaxis), latex (Severity: Severe, Reaction: anaphylaxis), Shellfish Containing Products (Severity: Severe, Reaction: anaphylaxis), amlodipine (Severity: Moderate, Reaction: swelling), evolocumab  (Severity: Mild, Reaction: itching), metformin (Severity: Mild, Reaction: nausea) Family History Unknown History. Social History Never smoker, Marital Status - Married, Alcohol Use - Never, Drug Use - No History, Caffeine Use - Rarely. Medical History Cardiovascular Patient has history of Congestive Heart Failure, Coronary Artery Disease, Hypertension, Peripheral Arterial Disease, Peripheral Venous Disease Endocrine Patient has history of Type II Diabetes Musculoskeletal Patient has history of Rheumatoid Arthritis, Osteoarthritis Neurologic Patient has history of Neuropathy Psychiatric Patient has history of Confinement Anxiety Patient is treated with Oral Agents. Blood sugar is not tested. Hospitalization/Surgery History - total hip arthroplasty (left)2021. - transthoracic echocardiogram 2021. - total hip arthroplasty (right) 2019. - cardiac cath 10/2016. - left heart cath 2014. - peroneal artery stent 2014. - atherectomy 2014. - CABG 1998. - back surgery. Medical A Surgical History Notes nd Eyes cataracts removed Cardiovascular NSTEMI, CABG dyslipidemia Gastrointestinal Barretts Esophagus Diverticulosis of colon anal fissure Peptic ulcer disease GERD Genitourinary chronic interstitial cystitis UTI Musculoskeletal degenerative joint disease avascular necrosis Eczema Neurologic CVA-Stroke vertigo Psychiatric general anxiety Review of Systems (ROS) Constitutional Symptoms (General Health) Complains or has symptoms of Fatigue. Eyes Complains or has symptoms of Vision Changes, Glasses / Contacts. Ear/Nose/Mouth/Throat Denies complaints or symptoms of Chronic sinus problems or rhinitis. Respiratory Complains or has symptoms of Shortness of Breath - with exertion. Gastrointestinal Complains or has symptoms of Nausea. Integumentary (Skin) celulitis of BLE Objective Constitutional respirations regular, non-labored and within target range for patient.. Vitals Time Taken:  1:48 PM, Height: 63 in, Source: Stated, Weight: 150 lbs, Source: Stated, BMI: 26.6, Temperature: 98.8 F, Pulse: 99 bpm, Respiratory Rate: 18 breaths/min, Blood Pressure: 153/71 mmHg. Cardiovascular 2+ dorsalis pedis/posterior tibialis pulses. Psychiatric pleasant and cooperative. General Notes: Superficial open blisters to the legs bilaterally. No signs of infection. 2+ pitting edema to the knee. Integumentary (Hair,  Skin) Wound #1 status is Open. Original cause of wound was Blister. The date acquired was: 03/30/2023. The wound is located on the Right Lower Leg. The wound Monica, Flores [place] ([id number]) [id number] Page 8 of 12 measures 4cm length x 6cm width x 0.1cm depth; 18.85cm^2 area and 1.885cm^3 volume. There is Fat Layer (Subcutaneous Tissue) exposed. There is no tunneling or undermining noted. There is a medium amount of serosanguineous drainage noted. There is small (1-33%) red, pink granulation within the wound bed. There is a large (67-100%) amount of necrotic tissue within the wound bed including Eschar and Adherent Slough. Wound #2 status is Open. Original cause of wound was Blister. The date acquired was: 03/30/2023. The wound is located on the Left,Medial Lower Leg. The wound measures 4cm length x 3cm width x 0.1cm depth; 9.425cm^2 area and 0.942cm^3 volume. There is Fat Layer (Subcutaneous Tissue) exposed. There is no tunneling or undermining noted. There is a medium amount of serosanguineous drainage noted. There is small (1-33%) red, pink granulation within the wound bed. There is a large (67-100%) amount of necrotic tissue within the wound bed including Eschar and Adherent Slough. Wound #3 status is Open. Original cause of wound was Blister. The date acquired was: 03/30/2023. The wound is located on the Left,Lateral Lower Leg. The wound measures 2cm length x 2cm width x 0.1cm depth; 3.142cm^2 area and 0.314cm^3 volume. There is no tunneling or undermining  noted. There is a medium amount of serosanguineous drainage noted. There is small (1-33%) red, pink granulation within the wound bed. There is a large (67-100%) amount of necrotic tissue within the wound bed including Eschar and Adherent Slough. Assessment Active Problems ICD-10 Non-pressure chronic ulcer of other part of left lower leg limited to breakdown of skin Non-pressure chronic ulcer of other part of right lower leg limited to breakdown of skin Chronic venous hypertension (idiopathic) with ulcer of bilateral lower extremity Peripheral vascular disease, unspecified Chronic diastolic (congestive) heart failure Patient presents with nonhealing wounds to her legs bilaterally in the setting of venous insufficiency and complicated by peripheral arterial disease. These are superficial wounds and I debrided devitalized tissue. I recommended Xeroform under Tubigrip. She can keep this in place and I will see her early next week. I am hopeful the areas will be healed by then. She knows to call with any questions or concerns. Procedures Wound #1 Pre-procedure diagnosis of Wound #1 is a Venous Leg Ulcer located on the Right Lower Leg .Severity of Tissue Pre Debridement is: Fat layer exposed. There was a Selective/Open Wound Skin/Epidermis Debridement with a total area of 18.84 sq cm performed by Geralyn Corwin, DO. With the following instrument(s): Forceps, and Scissors to remove Non-Viable tissue/material. Material removed includes Skin: Dermis and Skin: Epidermis and after achieving pain control using Lidocaine 4% Topical Solution. No specimens were taken. A time out was conducted at 14:00, prior to the start of the procedure. A Minimum amount of bleeding was controlled with Pressure. The procedure was tolerated well. Post Debridement Measurements: 4cm length x 6cm width x 0.1cm depth; 1.885cm^3 volume. Character of Wound/Ulcer Post Debridement is improved. Severity of Tissue Post Debridement  is: Fat layer exposed. Post procedure Diagnosis Wound #1: Same as Pre-Procedure General Notes: Scribed for Dr Mikey Bussing by Redmond Pulling, RN. Wound #2 Pre-procedure diagnosis of Wound #2 is a Venous Leg Ulcer located on the Left,Medial Lower Leg .Severity of Tissue Pre Debridement is: Fat layer exposed. There was a Selective/Open Wound Skin/Epidermis Debridement with a total area of  9.42 sq cm performed by Geralyn Corwin, DO. With the following instrument(s): Forceps, and Scissors to remove Non-Viable tissue/material. Material removed includes Skin: Dermis and Skin: Epidermis and after achieving pain control using Lidocaine 4% Topical Solution. No specimens were taken. A time out was conducted at 14:00, prior to the start of the procedure. A Minimum amount of bleeding was controlled with Pressure. The procedure was tolerated well. Post Debridement Measurements: 4cm length x 3cm width x 0.1cm depth; 0.942cm^3 volume. Character of Wound/Ulcer Post Debridement is improved. Severity of Tissue Post Debridement is: Fat layer exposed. Post procedure Diagnosis Wound #2: Same as Pre-Procedure General Notes: Scribed for Dr Mikey Bussing by Redmond Pulling, RN. Wound #3 Pre-procedure diagnosis of Wound #3 is a Venous Leg Ulcer located on the Left,Lateral Lower Leg .Severity of Tissue Pre Debridement is: Fat layer exposed. There was a Selective/Open Wound Skin/Epidermis Debridement with a total area of 3.14 sq cm performed by Geralyn Corwin, DO. With the following instrument(s): Forceps, and Scissors to remove Non-Viable tissue/material. Material removed includes Skin: Dermis and Skin: Epidermis and after achieving pain control using Lidocaine 4% Topical Solution. No specimens were taken. A time out was conducted at 14:00, prior to the start of the procedure. A Minimum amount of bleeding was controlled with Pressure. The procedure was tolerated well. Post Debridement Measurements: 2cm length x 2cm width x 0.1cm  depth; 0.314cm^3 volume. Character of Wound/Ulcer Post Debridement is improved. Severity of Tissue Post Debridement is: Fat layer exposed. Post procedure Diagnosis Wound #3: Same as Pre-Procedure General Notes: Scribed for Dr Mikey Bussing by Redmond Pulling, RN. Plan Follow-up Appointments: Return Appointment in 1 week. - Dr Mikey Bussing - over flow Tuesday 05/03/23 @ 0900 Anesthetic: Wound #1 Right Lower Leg: (In clinic) Topical Lidocaine 4% applied to wound bed Wound #2 Left,Medial Lower Leg: (In clinic) Topical Lidocaine 4% applied to wound bed Wound #3 Left,Lateral Lower Leg: (In clinic) Topical Lidocaine 4% applied to wound bed Bathing/ Shower/ Hygiene: Monica, Flores ([id number]) [id number] Page 9 of 12 May shower with protection but do not get wound dressing(s) wet. Protect dressing(s) with water repellant cover (for example, large plastic bag) or a cast cover and may then take shower. Edema Control - Lymphedema / SCD / Other: Elevate legs to the level of the heart or above for 30 minutes daily and/or when sitting for 3-4 times a day throughout the day. Avoid standing for long periods of time. Patient to wear own compression stockings every day. - Tubigrip Moisturize legs daily. The following medication(s) was prescribed: lidocaine topical 4 % cream cream topical once daily was prescribed at facility WOUND #1: - Lower Leg Wound Laterality: Right Cleanser: Soap and Water 2 x Per Week/15 Days Discharge Instructions: May shower and wash wound with dial antibacterial soap and water prior to dressing change. Prim Dressing: Xeroform Occlusive Gauze Dressing, 4x4 in 2 x Per Week/15 Days ary Discharge Instructions: Apply to wound bed as instructed Secondary Dressing: Zetuvit Plus Silicone Border Dressing 5x5 (in/in) 2 x Per Week/15 Days Discharge Instructions: Apply silicone border over primary dressing as directed. Secured With: Tubigrip Size D, 3x10 (in/yd) 2 x Per  Week/15 Days WOUND #2: - Lower Leg Wound Laterality: Left, Medial Cleanser: Soap and Water 2 x Per Week/15 Days Discharge Instructions: May shower and wash wound with dial antibacterial soap and water prior to dressing change. Prim Dressing: Xeroform Occlusive Gauze Dressing, 4x4 in 2 x Per Week/15 Days ary Discharge Instructions: Apply to wound bed as instructed Secondary Dressing: Zetuvit  Plus Silicone Border Dressing 5x5 (in/in) 2 x Per Week/15 Days Discharge Instructions: Apply silicone border over primary dressing as directed. Secured With: Tubigrip Size D, 3x10 (in/yd) 2 x Per Week/15 Days WOUND #3: - Lower Leg Wound Laterality: Left, Lateral Cleanser: Soap and Water 2 x Per Week/15 Days Discharge Instructions: May shower and wash wound with dial antibacterial soap and water prior to dressing change. Prim Dressing: Xeroform Occlusive Gauze Dressing, 4x4 in 2 x Per Week/15 Days ary Discharge Instructions: Apply to wound bed as instructed Secondary Dressing: Zetuvit Plus Silicone Border Dressing 5x5 (in/in) 2 x Per Week/15 Days Discharge Instructions: Apply silicone border over primary dressing as directed. Secured With: Tubigrip Size D, 3x10 (in/yd) 2 x Per Week/15 Days 1. Xeroform under Tubigrip to the lower extremities bilaterally 2. Follow-up in 1 week Electronic Signature(s) Signed: 06/13/2023 2:05:10 PM By: Pearletha Alfred Signed: 06/13/2023 2:57:08 PM By: Geralyn Corwin DO Previous Signature: 04/28/2023 4:21:44 PM Version By: Geralyn Corwin DO Entered By: Pearletha Alfred on 06/13/2023 14:05:10 -------------------------------------------------------------------------------- HxROS Details Patient Name: Date of Service: Monica Almond, MA RY M. 04/28/2023 12:45 PM Medical Record Number: [id number] Patient Account Number: [id number] Date of Birth/Sex: Treating RN: [birth year] (89 y.o. [name] Primary Care Provider: Irena Reichmann Other Clinician: Referring Provider: Treating  Provider/Extender: Jenne Pane in Treatment: 0 Information Obtained From Patient Constitutional Symptoms (General Health) Complaints and Symptoms: Positive for: Fatigue Eyes Complaints and Symptoms: Positive for: Vision Changes; Glasses / Contacts Medical History: Past Medical History Notes: cataracts removed Ear/Nose/Mouth/Throat Complaints and Symptoms: Monica, Flores ([id number]) [id number] Page 10 of 12 Negative for: Chronic sinus problems or rhinitis Respiratory Complaints and Symptoms: Positive for: Shortness of Breath - with exertion Gastrointestinal Complaints and Symptoms: Positive for: Nausea Medical History: Past Medical History Notes: Barretts Esophagus Diverticulosis of colon anal fissure Peptic ulcer disease GERD Hematologic/Lymphatic Cardiovascular Medical History: Positive for: Congestive Heart Failure; Coronary Artery Disease; Hypertension; Peripheral Arterial Disease; Peripheral Venous Disease Past Medical History Notes: NSTEMI, CABG dyslipidemia Endocrine Medical History: Positive for: Type II Diabetes Time with diabetes: 10 yrs Treated with: Oral agents Blood sugar tested every day: No Genitourinary Medical History: Past Medical History Notes: chronic interstitial cystitis UTI Integumentary (Skin) Complaints and Symptoms: Review of System Notes: celulitis of BLE Musculoskeletal Medical History: Positive for: Rheumatoid Arthritis; Osteoarthritis Past Medical History Notes: degenerative joint disease avascular necrosis Eczema Neurologic Medical History: Positive for: Neuropathy Past Medical History Notes: CVA-Stroke vertigo Psychiatric Medical History: Positive for: Confinement Anxiety Past Medical History Notes: general anxiety Immunizations Pneumococcal Vaccine: Received Pneumococcal Vaccination: No Monica, Flores ([id number]) [id number] Page 11  of 12 Implantable Devices No devices added Hospitalization / Surgery History Type of Hospitalization/Surgery total hip arthroplasty (left)2021 transthoracic echocardiogram 2021 total hip arthroplasty (right) 2019 cardiac cath 10/2016 left heart cath 2014 peroneal artery stent 2014 atherectomy 2014 CABG 1998 back surgery Family and Social History Unknown History: Yes; Never smoker; Marital Status - Married; Alcohol Use: Never; Drug Use: No History; Caffeine Use: Rarely; Financial Concerns: No; Food, Clothing or Shelter Needs: No; Support System Lacking: No; Transportation Concerns: No Electronic Signature(s) Signed: 04/28/2023 4:21:44 PM By: Geralyn Corwin DO Signed: 05/03/2023 4:26:34 PM By: Redmond Pulling RN, BSN Entered By: Redmond Pulling on 04/28/2023 13:54:11 -------------------------------------------------------------------------------- SuperBill Details Patient Name: Date of Service: Monica Almond, MA RY M. 04/28/2023 Medical Record Number: [id number] Patient Account Number: [id number] Date of Birth/Sex: Treating RN: 1933/10/10 (89 y.o. F) Primary Care Provider: Irena Reichmann Other Clinician: Referring Provider: Treating Provider/Extender:  Luan Moore Weeks in Treatment: 0 Diagnosis Coding ICD-10 Codes Code Description [phone] Non-pressure chronic ulcer of other part of left lower leg limited to breakdown of skin L97.811 Non-pressure chronic ulcer of other part of right lower leg limited to breakdown of skin I87.313 Chronic venous hypertension (idiopathic) with ulcer of bilateral lower extremity I73.9 Peripheral vascular disease, unspecified I50.32 Chronic diastolic (congestive) heart failure Facility Procedures : CPT4 Code: [id number] Description: 99213 - WOUND CARE VISIT-LEV 3 EST PT Modifier: 25 Quantity: 1 : CPT4 Code: [id number] Description: 97597 - DEBRIDE WOUND 1ST 20 SQ CM OR < ICD-10 Diagnosis Description L97.821 Non-pressure chronic ulcer of  other part of left lower leg limited to breakdown o L97.811 Non-pressure chronic ulcer of other part of right lower leg limited to  breakdown I87.313 Chronic venous hypertension (idiopathic) with ulcer of bilateral lower extremity Modifier: f skin of skin Quantity: 1 : CPT4 Code: [id number] Description: 97598 - DEBRIDE WOUND EA ADDL 20 SQ CM ICD-10 Diagnosis Description L97.821 Non-pressure chronic ulcer of other part of left lower leg limited to breakdown o L97.811 Non-pressure chronic ulcer of other part of right lower leg limited to  breakdown I87.313 Chronic venous hypertension (idiopathic) with ulcer of bilateral lower extremity Modifier: f skin of skin Quantity: 1 Physician Procedures : CPT4 Code Description Modifier [id number] [phone] - [facility] PHYS LEVEL 4 - NEW PT REBECAH, MATURA ([id number]) [phone] ICD-10 Diagnosis Description L97.821 Non-pressure chronic ulcer of other part of left lower leg limited to breakdown of  skin L97.811 Non-pressure chronic ulcer of other part of right lower leg limited to breakdown of skin I87.313 Chronic venous hypertension (idiopathic) with ulcer of bilateral lower extremity I73.9 Peripheral vascular disease, unspecified Quantity: 1 27.pdf Page 12 of 12 : [id number] 97597 - WC PHYS DEBR WO ANESTH 20 SQ CM 1 ICD-10 Diagnosis Description L97.821 Non-pressure chronic ulcer of other part of left lower leg limited to breakdown of skin L97.811 Non-pressure chronic ulcer of other part of right lower leg limited  to breakdown of skin I87.313 Chronic venous hypertension (idiopathic) with ulcer of bilateral lower extremity Quantity: : [id number] 97598 - WC PHYS DEBR WO ANESTH EA ADD 20 CM 1 ICD-10 Diagnosis Description L97.821 Non-pressure chronic ulcer of other part of left lower leg limited to breakdown of skin L97.811 Non-pressure chronic ulcer of other part of right lower leg  limited to breakdown of skin I87.313 Chronic venous hypertension (idiopathic) with  ulcer of bilateral lower extremity Quantity: Electronic Signature(s) Signed: 04/29/2023 9:32:46 AM By: Geralyn Corwin DO Signed: 05/03/2023 4:26:34 PM By: Redmond Pulling RN, BSN Previous Signature: 04/28/2023 4:21:44 PM Version By: Geralyn Corwin DO Entered By: Redmond Pulling on 04/28/2023 16:32:14

## 2023-06-13 NOTE — Progress Notes (Signed)
DNIYA, OURSLER Spearfish ([id number]) [id number] Page 1 of 7 Visit Report for 05/03/2023 Chief Complaint Document Details Patient Name: Date of Service: [name], [place] M. 05/03/2023 9:00 A M Medical Record Number: [id number] Patient Account Number: [id number] Date of Birth/Sex: Treating RN: November 23, 1933 (89 y.o. F) Primary Care Provider: Irena Reichmann Other Clinician: Referring Provider: Treating Provider/Extender: Jenne Pane in Treatment: 0 Information Obtained from: Patient Chief Complaint 04/28/2023; bilateral lower extremity wounds Electronic Signature(s) Signed: 05/03/2023 12:02:08 PM By: Geralyn Corwin DO Entered By: Geralyn Corwin on 05/03/2023 09:59:43 -------------------------------------------------------------------------------- HPI Details Patient Name: Date of Service: Monica Almond, MA RY M. 05/03/2023 9:00 A M Medical Record Number: [id number] Patient Account Number: [id number] Date of Birth/Sex: Treating RN: 07/05/1933 (89 y.o. F) Primary Care Provider: Irena Reichmann Other Clinician: Referring Provider: Treating Provider/Extender: Jenne Pane in Treatment: 0 History of Present Illness HPI Description: 04/28/2023 Ms. Emelynn Jourdan is an 89 year old female with a past medical history of severe PAD, essential hypertension, chronic diastolic heart failure and venous insufficiency that presents to the clinic for bilateral lower extremity wounds. She states that 1 month ago she developed blisters to her legs bilaterally. She is wearing compression stockings but decided after she developed the blisters to stop wearing them. She has been keeping the areas covered. She currently denies signs of infection. She is a fairly active individual and lives with her husband. She does not require assistance for her daily activities. 5/21; patient presents for follow-up. We have been using Xeroform under Tubigrip. She tolerated  this well. All wounds of healed. She has compression stockings at home. Electronic Signature(s) Signed: 05/03/2023 12:02:08 PM By: Geralyn Corwin DO Entered By: Geralyn Corwin on 05/03/2023 10:00:05 -------------------------------------------------------------------------------- Physical Exam Details Patient Name: Date of Service: Monica Almond, MA RY M. 05/03/2023 9:00 A M Medical Record Number: [id number] Patient Account Number: [id number] Date of Birth/Sex: Treating RN: [birth year] (89 y.o. Billijo Yerke, Jodelle Red ([id number]) [id number] Page 2 of 7 Primary Care Provider: Irena Reichmann Other Clinician: Referring Provider: Treating Provider/Extender: Jenne Pane in Treatment: 0 Constitutional respirations regular, non-labored and within target range for patient.. Cardiovascular 2+ dorsalis pedis/posterior tibialis pulses. Psychiatric pleasant and cooperative. Notes Epithelization to the previous wound sites on the right lower extremity and lateral left leg. Previous wound to the left medial leg has a thin layer of skin under illumination. 2+ pitting edema to the knee. Electronic Signature(s) Signed: 05/03/2023 12:02:08 PM By: Geralyn Corwin DO Entered By: Geralyn Corwin on 05/03/2023 10:02:28 -------------------------------------------------------------------------------- Physician Orders Details Patient Name: Date of Service: Monica Almond, MA RY M. 05/03/2023 9:00 A M Medical Record Number: [id number] Patient Account Number: [id number] Date of Birth/Sex: Treating RN: 04/03/1933 (89 y.o. Debara Pickett, Yvonne Kendall Primary Care Provider: Irena Reichmann Other Clinician: Referring Provider: Treating Provider/Extender: Jenne Pane in Treatment: 0 Verbal / Phone Orders: No Diagnosis Coding ICD-10 Coding Code Description [phone] Non-pressure chronic ulcer of other part of left lower leg limited to breakdown of skin L97.811  Non-pressure chronic ulcer of other part of right lower leg limited to breakdown of skin I87.313 Chronic venous hypertension (idiopathic) with ulcer of bilateral lower extremity I73.9 Peripheral vascular disease, unspecified I50.32 Chronic diastolic (congestive) heart failure Discharge From [facility] Discharge from Wound Care Center - Call if any future wound care needs. Control your swelling in your legs to help prevent blistering. Edema Control - Lymphedema / SCD / Other Elevate legs to the level of the  heart or above for 30 minutes daily and/or when sitting for 3-4 times a day throughout the day. Avoid standing for long periods of time. Patient to wear own compression stockings every day. - 20-73mmHg compression stockings. Exercise regularly Moisturize legs daily. Electronic Signature(s) Signed: 05/03/2023 12:02:08 PM By: Geralyn Corwin DO Entered By: Geralyn Corwin on 05/03/2023 10:02:56 Graylon Good ([id number]) [id number] Page 3 of 7 -------------------------------------------------------------------------------- Problem List Details Patient Name: Date of Service: [name], [place] M. 05/03/2023 9:00 A M Medical Record Number: [id number] Patient Account Number: [id number] Date of Birth/Sex: Treating RN: 04-Feb-1933 (89 y.o. Debara Pickett, Yvonne Kendall Primary Care Provider: Irena Reichmann Other Clinician: Referring Provider: Treating Provider/Extender: Jenne Pane in Treatment: 0 Active Problems ICD-10 Encounter Code Description Active Date MDM Diagnosis L97.821 Non-pressure chronic ulcer of other part of left lower leg limited to breakdown 04/28/2023 No Yes of skin L97.811 Non-pressure chronic ulcer of other part of right lower leg limited to breakdown 04/28/2023 No Yes of skin I87.313 Chronic venous hypertension (idiopathic) with ulcer of bilateral lower extremity 04/28/2023 No Yes I73.9 Peripheral vascular disease, unspecified  04/28/2023 No Yes I50.32 Chronic diastolic (congestive) heart failure 04/28/2023 No Yes Inactive Problems Resolved Problems Electronic Signature(s) Signed: 05/03/2023 12:02:08 PM By: Geralyn Corwin DO Entered By: Geralyn Corwin on 05/03/2023 09:58:24 -------------------------------------------------------------------------------- Progress Note Details Patient Name: Date of Service: Monica Almond, MA RY M. 05/03/2023 9:00 A M Medical Record Number: [id number] Patient Account Number: [id number] Date of Birth/Sex: Treating RN: September 25, 1933 (89 y.o. F) Primary Care Provider: Irena Reichmann Other Clinician: Referring Provider: Treating Provider/Extender: Jenne Pane in Treatment: 0 Subjective Chief Complaint Information obtained from Patient 04/28/2023; bilateral lower extremity wounds History of Present Illness (HPI) 04/28/2023 Ms. Latroya Mannie is an 89 year old female with a past medical history of severe PAD, essential hypertension, chronic diastolic heart failure and venous insufficiency that presents to the clinic for bilateral lower extremity wounds. She states that 1 month ago she developed blisters to her legs bilaterally. She is wearing compression stockings but decided after she developed the blisters to stop wearing them. She has been keeping the areas covered. She currently denies signs of infection. She is a fairly active individual and lives with her husband. She does not require assistance for her daily activities. ARRIN, REMILLARD Volcano ([id number]) [id number] Page 4 of 7 5/21; patient presents for follow-up. We have been using Xeroform under Tubigrip. She tolerated this well. All wounds of healed. She has compression stockings at home. Patient History Information obtained from Patient. Family History Unknown History. Social History Never smoker, Marital Status - Married, Alcohol Use - Never, Drug Use - No History, Caffeine Use -  Rarely. Medical History Cardiovascular Patient has history of Congestive Heart Failure, Coronary Artery Disease, Hypertension, Peripheral Arterial Disease, Peripheral Venous Disease Endocrine Patient has history of Type II Diabetes Musculoskeletal Patient has history of Rheumatoid Arthritis, Osteoarthritis Neurologic Patient has history of Neuropathy Psychiatric Patient has history of Confinement Anxiety Hospitalization/Surgery History - total hip arthroplasty (left)2021. - transthoracic echocardiogram 2021. - total hip arthroplasty (right) 2019. - cardiac cath 10/2016. - left heart cath 2014. - peroneal artery stent 2014. - atherectomy 2014. - CABG 1998. - back surgery. Medical A Surgical History Notes nd Eyes cataracts removed Cardiovascular NSTEMI, CABG dyslipidemia Gastrointestinal Barretts Esophagus Diverticulosis of colon anal fissure Peptic ulcer disease GERD Genitourinary chronic interstitial cystitis UTI Musculoskeletal degenerative joint disease avascular necrosis Eczema Neurologic CVA-Stroke vertigo Psychiatric general anxiety Objective Constitutional respirations regular, non-labored and  within target range for patient.. Vitals Time Taken: 9:31 AM, Height: 63 in, Weight: 150 lbs, BMI: 26.6, Temperature: 98.3 F, Pulse: 65 bpm, Respiratory Rate: 18 breaths/min, Blood Pressure: 148/72 mmHg. Cardiovascular 2+ dorsalis pedis/posterior tibialis pulses. Psychiatric pleasant and cooperative. General Notes: Epithelization to the previous wound sites on the right lower extremity and lateral left leg. Previous wound to the left medial leg has a thin layer of skin under illumination. 2+ pitting edema to the knee. Integumentary (Hair, Skin) Wound #1 status is Healed - Epithelialized. Original cause of wound was Blister. The date acquired was: 03/30/2023. The wound is located on the Right Lower Leg. The wound measures 0cm length x 0cm width x 0cm depth; 0cm^2 area and 0cm^3  volume. There is Fat Layer (Subcutaneous Tissue) exposed. There is no tunneling or undermining noted. There is a medium amount of serosanguineous drainage noted. There is no granulation within the wound bed. There is no necrotic tissue within the wound bed. Wound #2 status is Healed - Epithelialized. Original cause of wound was Blister. The date acquired was: 03/30/2023. The wound is located on the Left,Medial Lower Leg. The wound measures 0cm length x 0cm width x 0cm depth; 0cm^2 area and 0cm^3 volume. There is Fat Layer (Subcutaneous Tissue) exposed. There is no tunneling or undermining noted. There is a medium amount of serosanguineous drainage noted. There is large (67-100%) red, pink granulation within the wound bed. There is no necrotic tissue within the wound bed. Wound #3 status is Healed - Epithelialized. Original cause of wound was Blister. The date acquired was: 03/30/2023. The wound is located on the Left,Lateral Lower Leg. The wound measures 0cm length x 0cm width x 0cm depth; 0cm^2 area and 0cm^3 volume. There is no tunneling or undermining noted. There is a medium amount of serosanguineous drainage noted. There is no granulation within the wound bed. There is no necrotic tissue within the wound bed. COTTIE, SHUGARTS Walton ([id number]) [id number] Page 5 of 7 Assessment Active Problems ICD-10 Non-pressure chronic ulcer of other part of left lower leg limited to breakdown of skin Non-pressure chronic ulcer of other part of right lower leg limited to breakdown of skin Chronic venous hypertension (idiopathic) with ulcer of bilateral lower extremity Peripheral vascular disease, unspecified Chronic diastolic (congestive) heart failure Patient has done well with Xeroform and Tubigrip. Her wounds have healed. I recommended wearing her compression stockings daily and elevating her legs when sitting for long periods of time. She may follow-up as needed. She knows to call with  any questions or concerns. Plan Discharge From [facility] Services: Discharge from Wound Care Center - Call if any future wound care needs. Control your swelling in your legs to help prevent blistering. Edema Control - Lymphedema / SCD / Other: Elevate legs to the level of the heart or above for 30 minutes daily and/or when sitting for 3-4 times a day throughout the day. Avoid standing for long periods of time. Patient to wear own compression stockings every day. - 20-52mmHg compression stockings. Exercise regularly Moisturize legs daily. 1. Compression stockings daily 2. Follow-up as needed Electronic Signature(s) Signed: 06/13/2023 2:05:32 PM By: Pearletha Alfred Signed: 06/13/2023 2:57:08 PM By: Geralyn Corwin DO Previous Signature: 05/03/2023 12:02:08 PM Version By: Geralyn Corwin DO Entered By: Pearletha Alfred on 06/13/2023 14:05:32 -------------------------------------------------------------------------------- HxROS Details Patient Name: Date of Service: Monica Almond, MA RY M. 05/03/2023 9:00 A M Medical Record Number: [id number] Patient Account Number: [id number] Date of Birth/Sex: Treating RN: [birth year] (89 y.o. F) Primary Care  Provider: Irena Reichmann Other Clinician: Referring Provider: Treating Provider/Extender: Jenne Pane in Treatment: 0 Information Obtained From Patient Eyes Medical History: Past Medical History Notes: cataracts removed Cardiovascular Medical History: Positive for: Congestive Heart Failure; Coronary Artery Disease; Hypertension; Peripheral Arterial Disease; Peripheral Venous Disease Past Medical History Notes: NSTEMI, CABG dyslipidemia Gastrointestinal Medical History: Past Medical History NotesMarland Kitchen VONTELLA, PATSY ([id number]) [id number] Page 6 of 7 Barretts Esophagus Diverticulosis of colon anal fissure Peptic ulcer disease GERD Endocrine Medical History: Positive for: Type II Diabetes Time with  diabetes: 10 yrs Treated with: Oral agents Blood sugar tested every day: No Genitourinary Medical History: Past Medical History Notes: chronic interstitial cystitis UTI Musculoskeletal Medical History: Positive for: Rheumatoid Arthritis; Osteoarthritis Past Medical History Notes: degenerative joint disease avascular necrosis Eczema Neurologic Medical History: Positive for: Neuropathy Past Medical History Notes: CVA-Stroke vertigo Psychiatric Medical History: Positive for: Confinement Anxiety Past Medical History Notes: general anxiety Immunizations Pneumococcal Vaccine: Received Pneumococcal Vaccination: No Implantable Devices No devices added Hospitalization / Surgery History Type of Hospitalization/Surgery total hip arthroplasty (left)2021 transthoracic echocardiogram 2021 total hip arthroplasty (right) 2019 cardiac cath 10/2016 left heart cath 2014 peroneal artery stent 2014 atherectomy 2014 CABG 1998 back surgery Family and Social History Unknown History: Yes; Never smoker; Marital Status - Married; Alcohol Use: Never; Drug Use: No History; Caffeine Use: Rarely; Financial Concerns: No; Food, Clothing or Shelter Needs: No; Support System Lacking: No; Transportation Concerns: No Electronic Signature(s) Signed: 05/03/2023 12:02:08 PM By: Geralyn Corwin DO Entered By: Geralyn Corwin on 05/03/2023 10:00:10 Graylon Good ([id number]) [id number] Page 7 of 7 -------------------------------------------------------------------------------- SuperBill Details Patient Name: Date of Service: [name], [place] M. 05/03/2023 Medical Record Number: [id number] Patient Account Number: [id number] Date of Birth/Sex: Treating RN: May 11, 1933 (89 y.o. Debara Pickett, Yvonne Kendall Primary Care Provider: Irena Reichmann Other Clinician: Referring Provider: Treating Provider/Extender: Jenne Pane in Treatment: 0 Diagnosis Coding ICD-10  Codes Code Description [phone] Non-pressure chronic ulcer of other part of left lower leg limited to breakdown of skin L97.811 Non-pressure chronic ulcer of other part of right lower leg limited to breakdown of skin I87.313 Chronic venous hypertension (idiopathic) with ulcer of bilateral lower extremity I73.9 Peripheral vascular disease, unspecified I50.32 Chronic diastolic (congestive) heart failure Facility Procedures : CPT4 Code: [id number] Description: 99213 - WOUND CARE VISIT-LEV 3 EST PT Modifier: Quantity: 1 Physician Procedures : CPT4 Code Description Modifier [id number] 99213 - WC PHYS LEVEL 3 - EST PT ICD-10 Diagnosis Description L97.821 Non-pressure chronic ulcer of other part of left lower leg limited to breakdown of skin L97.811 Non-pressure chronic ulcer of other part of  right lower leg limited to breakdown of skin I87.313 Chronic venous hypertension (idiopathic) with ulcer of bilateral lower extremity I73.9 Peripheral vascular disease, unspecified Quantity: 1 Electronic Signature(s) Signed: 05/03/2023 12:02:08 PM By: Geralyn Corwin DO Entered By: Geralyn Corwin on 05/03/2023 10:04:08

## 2023-07-18 ENCOUNTER — Emergency Department (HOSPITAL_COMMUNITY): Payer: Medicare Other

## 2023-07-18 ENCOUNTER — Other Ambulatory Visit: Payer: Self-pay

## 2023-07-18 ENCOUNTER — Encounter (HOSPITAL_COMMUNITY): Payer: Self-pay

## 2023-07-18 ENCOUNTER — Emergency Department (HOSPITAL_COMMUNITY)
Admission: EM | Admit: 2023-07-18 | Discharge: 2023-07-18 | Disposition: A | Discharge status: Home / Self Care | Payer: Medicare Other | Attending: Emergency Medicine | Admitting: Emergency Medicine

## 2023-07-18 DIAGNOSIS — W010XXA Fall on same level from slipping, tripping and stumbling without subsequent striking against object, initial encounter: Secondary | ICD-10-CM | POA: Insufficient documentation

## 2023-07-18 DIAGNOSIS — I110 Hypertensive heart disease with heart failure: Secondary | ICD-10-CM | POA: Diagnosis not present

## 2023-07-18 DIAGNOSIS — Z7902 Long term (current) use of antithrombotics/antiplatelets: Secondary | ICD-10-CM | POA: Diagnosis not present

## 2023-07-18 DIAGNOSIS — M25551 Pain in right hip: Secondary | ICD-10-CM | POA: Diagnosis present

## 2023-07-18 DIAGNOSIS — E119 Type 2 diabetes mellitus without complications: Secondary | ICD-10-CM | POA: Insufficient documentation

## 2023-07-18 DIAGNOSIS — W19XXXA Unspecified fall, initial encounter: Secondary | ICD-10-CM

## 2023-07-18 DIAGNOSIS — Z951 Presence of aortocoronary bypass graft: Secondary | ICD-10-CM | POA: Insufficient documentation

## 2023-07-18 DIAGNOSIS — Z96642 Presence of left artificial hip joint: Secondary | ICD-10-CM | POA: Insufficient documentation

## 2023-07-18 DIAGNOSIS — I5032 Chronic diastolic (congestive) heart failure: Secondary | ICD-10-CM | POA: Insufficient documentation

## 2023-07-18 LAB — CBG MONITORING, ED: Glucose-Capillary: 139 mg/dL — ABNORMAL HIGH (ref 70–99)

## 2023-07-18 NOTE — ED Provider Notes (Signed)
  Physical Exam  BP (!) 135/57   Pulse 63   Temp 97.9 F (36.6 C) (Temporal)   Resp 19   Ht 5\' 3"  (1.6 m)   Wt 70 kg   SpO2 92%   BMI 27.34 kg/m   Physical Exam  Procedures  Procedures  ED Course / MDM   Clinical Course as of 07/18/23 1606  Mon Jul 18, 2023  1604 89yF here w/ mechanical fall going to her car. Xrays of extremities. Negative with exception of ? T12 compression fracture. Getting CT and TLSO - f/u CT imaging - ortho f/u - ambulation trial [CD]    Clinical Course User Index [CD] Rhys Martini, DO   Medical Decision Making Amount and/or Complexity of Data Reviewed Radiology: ordered.   ***

## 2023-07-18 NOTE — ED Notes (Signed)
Per md, walk if no fractures

## 2023-07-18 NOTE — ED Triage Notes (Signed)
Pt had a mechanical fall in the driveway while walking to the car. Pt is alert and oriented. C-collar for precaution. Right femur has ecchymosis and swelling that has grown since the initial injury.   Pt has not taken her plavix in a couple of days.   20 g r hand 100 mcg fentanyl

## 2023-07-18 NOTE — Discharge Instructions (Addendum)
You were seen today for a fall. While you were here we monitored your vitals, preformed a physical exam, and took x-rays. These were all reassuring and there is no indication for any further testing or intervention in the emergency department at this time. Your CT scan does not show any evidence of a broken bone in your back.  Things to do:  - Follow up with your primary care provider within the next 1-2 weeks  Return to the emergency department if you have any new or worsening symptoms including severe pain, weakness/numbness, chest pain, shortness of breath, or if you have any other concerns.

## 2023-07-18 NOTE — ED Notes (Signed)
Pt transported to Xray. 

## 2023-07-18 NOTE — ED Notes (Signed)
Assisted pt w/ ambulating in hall. Pt able to ambulate but reports right leg pain. EDP notified.

## 2023-07-18 NOTE — Progress Notes (Signed)
Orthopedic Tech Progress Note Patient Details:  [name] December 25, 1932 [id number]  MD canceled TLSO ORDER    Patient ID: [name], female   DOB: 1933-02-25, 89 y.o.   MRN: [id number]  [name] 07/18/2023, 6:37 PM

## 2023-07-18 NOTE — ED Provider Notes (Signed)
[place] EMERGENCY DEPARTMENT AT [facility] Provider Note  MDM   HPI/ROS:  [name] is a 89 y.o. female with hypertension, hyperlipidemia, peripheral artery disease, diabetes, CHF presenting after mechanical fall with right hip pain.  Earlier this morning, patient was getting into her car and slipped and fell onto the ground, landing on her right leg and abdomen.  She denies syncope/presyncope prior to the event.  Patient received 100 mcg of fentanyl en route by EMS.  Upon arrival, patient is GCS 15, endorsing that her pain is well-controlled.  She is on Plavix but no DOACs, and denies head trauma or loss of consciousness.  Given mechanism of injury, patient arrived in a c-collar.  Physical exam is notable for: - Head atraumatic, normocephalic - No tenderness palpation of C, T, or L-spine - No significant abdominal tenderness - Approximately 6 x 6 cm hematoma on right lateral thigh.  Neurovascularly intact distal to injury with a focal neurologic exam and 1+ right-sided DP pulse.  Strength equal bilaterally - Remainder secondary survey unremarkable  On my initial evaluation, patient is:  -Vital signs stable. Patient afebrile, hemodynamically stable, and non-toxic appearing. -Additional history obtained from EMS, chart review  Given the patient's history and physical exam, differential diagnosis includes but is not limited to acute fractures, dislocations, other traumatic injuries.  Interpretations, interventions, and the patient's course of care are documented below.    Plain films ordered of injured extremities.  Resulted with no acute fractures or dislocations but with potential compression deformity of thoracic spine.  Upon reassessment, patient remains resting comfortably with no new symptoms.  Pain is well-controlled.  CT imaging of thoracic and lumbar spine ordered.  Care of this patient was assumed by oncoming team of providers.  Please see their notes for further  details.  Disposition:  Handoff  Clinical Impression:  1. Fall, initial encounter     Rx / DC Orders ED Discharge Orders     None       The plan for this patient was discussed with Dr. Dalene Seltzer, who voiced agreement and who oversaw evaluation and treatment of this patient.   Clinical Complexity A medically appropriate history, review of systems, and physical exam was performed.  My independent interpretations of EKG, labs, and radiology are documented in the ED course above.   Click here for ABCD2, HEART and other calculatorsREFRESH Note before signing   Patient's presentation is most consistent with acute complicated illness / injury requiring diagnostic workup.  Medical Decision Making Amount and/or Complexity of Data Reviewed Radiology: ordered.    HPI/ROS      See MDM section for pertinent HPI and ROS. A complete ROS was performed with pertinent positives/negatives noted above.   Past Medical History:  Diagnosis Date   Allergy to IVP dye    Angina, class I (HCC) 04/22/2015   Atherosclerotic heart disease native coronary artery w/angina pectoris (HCC) 1992   a. 1992 s/p PTCA of LAD;  b. 1998 CABG x 3; c. 07/2001 Cath: Sev LM/LAD/LCX dzs, 3/3 patent grafts; d. 11/2013 Cath: stable graft anatomy; e. 10/2016 Cath: native 3VD, VG->OM nl, LIMA->LAD nl, VG->Diag 55.   Atherosclerotic heart disease of native coronary artery with angina pectoris (HCC) 01/14/2010   Qualifier: Diagnosis of  By: Arlyce Dice MD, Barbette Hair    Atrial septal aneurysm    Barrett's esophagus 03/04/2010   Qualifier: Diagnosis of  By: Myrtie Hawk, Amy S    Bilateral leg edema 04/22/2015   Bradycardia, mild briefly  to the 40s, asymptomatic 05/16/2013   Carotid arterial disease (HCC)    a. 05/2014 Carotid U/S: No signif bilat ICA stenosis, >60% L ECA.   Chronic anemia    Chronic diastolic CHF (congestive heart failure) (HCC)    a. 10/2016 Echo: Ef 55-60%, no rwma, Gr1 DD, triv AI, PASP ,  atrial septal aneurysm.   Chronic diastolic CHF (congestive heart failure), NYHA class 2 (HCC) 08/11/2017   Claudication (HCC) 05/16/2013   Peripheral arterial occlusive disease with lifestyle limiting claudication    Degenerative joint disease 05/30/2018   DIVERTICULOSIS OF COLON 01/14/2010   Qualifier: Diagnosis of  By: Arlyce Dice MD, Barbette Hair    DM (diabetes mellitus), type 2 with peripheral vascular complications (HCC)    On Oral medications.   DOE (dyspnea on exertion) 04/22/2015   Essential hypertension    FECAL INCONTINENCE 03/04/2010   Qualifier: Diagnosis of  By: Monica Becton PA-c, Amy S    GERD (gastroesophageal reflux disease)    Hyperlipidemia with target LDL less than 70    Inflammatory arthritis 10/13/2017   Neuropathy    Non-insulin dependent type 2 diabetes mellitus (HCC) 10/24/2015   NSTEMI (non-ST elevated myocardial infarction) (HCC) 11/01/2016   PAD (peripheral artery disease) (HCC) 05/2013; 09/2013   a. severe calcified POP-1 & TP Trunk Dz bilat;  b . 05/2013 Diamondback Rot Athrectomy (R POP) --> PTA R Pop, DES to R Peroneal;  c. 09/2013 PTA/Stenting of L Pop Clemencia Course - retrograde access);  d. 04/2014 ABI's: R/L: 1.1/1.1.   Preoperative cardiovascular examination 04/15/2020   PUD (peptic ulcer disease)    Rheumatoid arthritis (HCC)    S/P CABG x 3 1998   LIMA-LAD, SVG-OM, SVG-D1   Severe claudication (HCC) 05/2013   Referred for Peripheral Angio (Dr. Allyson Sabal --> Dr. Hoy Finlay in [place])   [facility] ALLERGY 01/14/2010   Qualifier: Diagnosis of  By: Candice Camp CMA (AAMA), [name]     Status post total replacement of right hip 10/12/2018   Vertigo 01/02/2014    Past Surgical History:  Procedure Laterality Date   ATHERECTOMY Right 05/15/2013   Procedure: ATHERECTOMY;  Surgeon: Runell Gess, MD;  Location: [facility] CATH LAB;  Service: Cardiovascular;  Laterality: Right;  popliteal   BACK SURGERY     CARDIAC CATHETERIZATION  07/17/01   2 v CAD with LM, circ, LAD, obtuse diag,  mod stenosis of diag vein graft , mild stenosis of marg vein graft, nl EF, medical treatment   CARDIAC CATHETERIZATION  11/2013   Widely patent LIMA-LAD & SVG-OM with mild progression of proximal stenosis ~40-50% in SVG-D1; Widely patent native RCA with known severe native LCA disease   CARDIAC CATHETERIZATION N/A 11/01/2016   Procedure: Left Heart Cath and Cors/Grafts Angiography;  Surgeon: [name], MD;  Location: [facility] INVASIVE CV LAB;  Service: Cardiovascular: Hemodynamics: High LVEDP!.  Cors/Grafts: LM 55%, o-p LAD 100% then after D1 -> LIMA-LAD patent, SVG-Diag ~55%. small RI wiht ~70% ostial. O-p Cx 80% - pCx 70% --> SVG-bifurcating OM patent. -- med Rx   CORONARY ANGIOPLASTY  1992   PCI to LAD   CORONARY ARTERY BYPASS GRAFT  1998   LIMA-LAD, SVG-diagonal (none roughly 50% stenosis), SVG-OM   LEFT HEART CATHETERIZATION WITH CORONARY ANGIOGRAM N/A 11/27/2013   Procedure: LEFT HEART CATHETERIZATION WITH CORONARY ANGIOGRAM;  Surgeon: [name], MD;  Location: [facility] CATH LAB;  Service: Cardiovascular;  Laterality: N/A;   LOW EXTREMITY DOPPLERS/ABI  10/2016   Patent right SFA, popliteal and peroneal tendons.  Patent left SFA and popliteal stent. 30-50% stenosis in the right common femoral and popliteal arteries, 50-74% stenosis in left profunda femoris artery. --> 1 year follow-up.  RABI - 1.2, LABI 1.1   LOWER EXTREMITY ANGIOGRAM N/A 02/22/2013   Procedure: LOWER EXTREMITY ANGIOGRAM;  Surgeon: Runell Gess, MD;  Location: [facility] CATH LAB;  Service: Cardiovascular;  Laterality: N/A;   LOWER EXTREMITY ANGIOGRAM N/A 07/20/2013   Procedure: LOWER EXTREMITY ANGIOGRAM;  Surgeon: Runell Gess, MD;  Location: [facility] CATH LAB;  Service: Cardiovascular;  Laterality: N/A;   NM MYOVIEW LTD  04/03/2013; 5/26'/2016   Lexiscan: a)  Apical perfusion defect with no ischemia. Consider prior infarct versus breast attenuation.;; b) 5/'16: LOW RISK, Nl EF ~55-65%, No Ischemia /Infarction   NM MYOVIEW LTD  10/'19;  4/'21   a) LOW RISK.  EF 55%.  No ischemia or infarction. b) EF estimated 81%.  Suboptimal images.  May be subtle inferior ischemia-initially read by radiology as intermediate risk, however overread by cardiology to be mild low risk.Marland Kitchen   PERCUTANEOUS STENT INTERVENTION Right 05/15/2013   Procedure: PERCUTANEOUS STENT INTERVENTION;  Surgeon: Runell Gess, MD;  Location: [facility] CATH LAB;  Service: Cardiovascular;  Laterality: Right;  tibioperoneal trunk   Peroneal Artery Stent  05/15/13   Diamondback rot. atherectomy of high grade segmental popliteal stenosis then Chocolate balloonPTA; then angiosculpt PTA of the prox peroneal with stent-Xpedition    pv angio  07/20/13   high-grade calcified popliteal disease with one-vessel runoff via a small anterior tibial artery that has proximal disease as well, no intervention   TOTAL HIP ARTHROPLASTY Right 10/12/2018   Procedure: RIGHT TOTAL HIP ARTHROPLASTY ANTERIOR APPROACH;  Surgeon: Kathryne Hitch, MD;  Location: [facility] ORS;  Service: Orthopedics;  Laterality: Right;   TOTAL HIP ARTHROPLASTY Left 11/21/2020   Procedure: LEFT TOTAL HIP ARTHROPLASTY ANTERIOR APPROACH;  Surgeon: Kathryne Hitch, MD;  Location: [facility] ORS;  Service: Orthopedics;  Laterality: Left;   TRANSTHORACIC ECHOCARDIOGRAM  11/'17; 10/19   a) EF 55-60%. Gr 1 DD. Normal PAP. Aortic Sclerosis.;; b) Normal LV size and function with vigorous EF 65 to 70%.  GR 1 DD.  Severe LA dilation (suggestive of probably worsening: GR 1 DD).   TRANSTHORACIC ECHOCARDIOGRAM  03/18/2020   EF 70-75%.  Hyperdynamic.  GR 1 DD.  No R WMA.  Mild LA dilation.  Mild aortic valve sclerosis but no stenosis.-No change from prior echo   TUBAL LIGATION        Physical Exam   Vitals:   07/18/23 1353 07/18/23 1556 07/18/23 1714 07/18/23 1928  BP: (!) 135/57  (!) 177/71 (!) 169/67  Pulse: 63  (!) 53 (!) 54  Resp: 19  16 18   Temp:  97.9 F (36.6 C) 97.6 F (36.4 C) 97.8 F (36.6 C)  TempSrc:  Temporal Oral  Oral  SpO2: 92%  98% 98%  Weight:      Height:        Physical Exam Vitals and nursing note reviewed.  Constitutional:      General: She is not in acute distress.    Appearance: She is well-developed.  HENT:     Head: Normocephalic and atraumatic.  Eyes:     Conjunctiva/sclera: Conjunctivae normal.  Cardiovascular:     Rate and Rhythm: Normal rate and regular rhythm.     Heart sounds: No murmur heard. Pulmonary:     Effort: Pulmonary effort is normal. No respiratory distress.     Breath sounds: Normal breath  sounds.  Abdominal:     Palpations: Abdomen is soft.     Tenderness: There is no abdominal tenderness.  Musculoskeletal:        General: Swelling and tenderness present. No deformity.     Cervical back: Neck supple.  Skin:    General: Skin is warm and dry.     Capillary Refill: Capillary refill takes less than 2 seconds.  Neurological:     Mental Status: She is alert.  Psychiatric:        Mood and Affect: Mood normal.     Starleen Arms, MD Department of Emergency Medicine   Please note that this documentation was produced with the assistance of voice-to-text technology and may contain errors.    Dyanne Iha, MD 07/19/23 [id number]    Alvira Monday, MD 07/27/23 [phone]

## 2023-07-18 NOTE — ED Notes (Signed)
Updated family on plan of care and patient transported to CT.

## 2023-08-04 ENCOUNTER — Ambulatory Visit: Payer: Medicare Other | Admitting: Physician Assistant

## 2023-08-04 ENCOUNTER — Encounter: Payer: Self-pay | Admitting: Physician Assistant

## 2023-08-04 ENCOUNTER — Other Ambulatory Visit: Payer: Self-pay

## 2023-08-04 DIAGNOSIS — T148XXA Other injury of unspecified body region, initial encounter: Secondary | ICD-10-CM

## 2023-08-04 DIAGNOSIS — M25551 Pain in right hip: Secondary | ICD-10-CM
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
## 2023-08-08 ENCOUNTER — Ambulatory Visit: Payer: Medicare Other | Admitting: Orthopaedic Surgery

## 2023-08-11 ENCOUNTER — Ambulatory Visit: Payer: Medicare Other | Admitting: Physician Assistant

## 2023-08-22 ENCOUNTER — Ambulatory Visit: Payer: Medicare Other | Admitting: Physician Assistant

## 2023-08-27 ENCOUNTER — Encounter (HOSPITAL_COMMUNITY): Payer: Self-pay | Admitting: *Deleted

## 2023-08-27 ENCOUNTER — Ambulatory Visit (HOSPITAL_COMMUNITY)
Admission: EM | Admit: 2023-08-27 | Discharge: 2023-08-27 | Disposition: A | Discharge status: Home / Self Care | Payer: Medicare Other | Attending: Family Medicine | Admitting: Family Medicine

## 2023-08-27 DIAGNOSIS — R030 Elevated blood-pressure reading, without diagnosis of hypertension: Secondary | ICD-10-CM

## 2023-08-27 DIAGNOSIS — I10 Essential (primary) hypertension: Secondary | ICD-10-CM | POA: Diagnosis not present

## 2023-08-27 DIAGNOSIS — T148XXA Other injury of unspecified body region, initial encounter: Secondary | ICD-10-CM | POA: Diagnosis not present

## 2023-08-27 MED ORDER — TRIAMCINOLONE ACETONIDE 0.5 % EX CREA: 1.0000 | TOPICAL_CREAM | Freq: Two times a day (BID) | CUTANEOUS | 0 refills | Status: AC

## 2023-08-27 MED ORDER — DOXYCYCLINE HYCLATE 100 MG PO CAPS: 100.0000 mg | ORAL_CAPSULE | Freq: Two times a day (BID) | ORAL | 0 refills | Status: DC

## 2023-08-27 NOTE — Discharge Instructions (Addendum)
It was nice seeing you. I am sorry about your skin blisters. Sometimes, we have blisters when we have autoimmune diseases or when it is medication-related. I will start you on topical steroids and antibiotics for this. The blister should pop by itself. If it worsens please see [place] soon. Follow up with your PCP to screen you for autoimmune diseases and also review your medication that may cause blistering.  Also, your BP medications include Hydralazine 25 mg three times daily, Coreg 6.25 mg BID, and Imdure one and a half tablets daily. See PCP soon for medication management.

## 2023-08-27 NOTE — ED Triage Notes (Addendum)
Pt reports noting blistering to right distal lower leg 7 days ago. Site is painful with some erythema. States had same thing happen in May 2024 and had [facility].

## 2023-08-27 NOTE — ED Provider Notes (Signed)
[facility]    CSN: [id number] Arrival date & time: 08/27/23  1422      History   Chief Complaint Chief Complaint  Patient presents with   Blister    HPI [name] is a 89 y.o. female.   The history is provided by the patient. No language interpreter was used.  Rash Location: Blister on her right shin x 1 week ago. Quality: blistering, itchiness and painful   Quality: not draining   Quality comment:  This started as a tiny bump and gradually increased in size Pain details:    Quality:  Aching   Severity:  Mild   Duration:  1 week   Timing:  Constant   Progression:  Worsening Severity:  Moderate Onset quality:  Gradual Duration:  1 week Timing:  Constant Progression:  Worsening Chronicity:  Recurrent (She had a similar presentation a few months ago which was popped, became infected and was sent to the wound clinic for treatment) Context: not animal contact, not chemical exposure, not exposure to similar rash, not insect bite/sting, not new detergent/soap, not plant contact and not sick contacts   Relieved by:  Nothing Worsened by:  Nothing Ineffective treatments:  None tried Associated symptoms: no fever, no headaches and no shortness of breath   Hypertension This is a chronic problem. Pertinent negatives include no chest pain, no headaches and no shortness of breath. Nothing aggravates the symptoms.  For her BP, she takes Hydralazine 25 mg TID, Coreg 6.25 mg qhs, and Imdur 60 mg QD. She is, however, supposed to be on Coreg BID and Indur 1.5 tablets. Her home BP checked yesterday was 127/?? HR 69. She can't be specific about her BP readings.  Past Medical History:  Diagnosis Date   Allergy to IVP dye    Angina, class I (HCC) 04/22/2015   Atherosclerotic heart disease native coronary artery w/angina pectoris (HCC) 1992   a. 1992 s/p PTCA of LAD;  b. 1998 CABG x 3; c. 07/2001 Cath: Sev LM/LAD/LCX dzs, 3/3 patent grafts; d. 11/2013 Cath: stable graft  anatomy; e. 10/2016 Cath: native 3VD, VG->OM nl, LIMA->LAD nl, VG->Diag 55.   Atherosclerotic heart disease of native coronary artery with angina pectoris (HCC) 01/14/2010   Qualifier: Diagnosis of  By: Arlyce Dice MD, Barbette Hair    Atrial septal aneurysm    Barrett's esophagus 03/04/2010   Qualifier: Diagnosis of  By: Myrtie Hawk, Amy S    Bilateral leg edema 04/22/2015   Bradycardia, mild briefly to the 40s, asymptomatic 05/16/2013   Carotid arterial disease (HCC)    a. 05/2014 Carotid U/S: No signif bilat ICA stenosis, >60% L ECA.   Chronic anemia    Chronic diastolic CHF (congestive heart failure) (HCC)    a. 10/2016 Echo: Ef 55-60%, no rwma, Gr1 DD, triv AI, PASP , atrial septal aneurysm.   Chronic diastolic CHF (congestive heart failure), NYHA class 2 (HCC) 08/11/2017   Claudication (HCC) 05/16/2013   Peripheral arterial occlusive disease with lifestyle limiting claudication    Degenerative joint disease 05/30/2018   DIVERTICULOSIS OF COLON 01/14/2010   Qualifier: Diagnosis of  By: Arlyce Dice MD, Barbette Hair    DM (diabetes mellitus), type 2 with peripheral vascular complications (HCC)    On Oral medications.   DOE (dyspnea on exertion) 04/22/2015   Essential hypertension    FECAL INCONTINENCE 03/04/2010   Qualifier: Diagnosis of  By: Monica Becton PA-c, Amy S    GERD (gastroesophageal reflux disease)    Hyperlipidemia with target  LDL less than 70    Inflammatory arthritis 10/13/2017   Neuropathy    Non-insulin dependent type 2 diabetes mellitus (HCC) 10/24/2015   NSTEMI (non-ST elevated myocardial infarction) (HCC) 11/01/2016   PAD (peripheral artery disease) (HCC) 05/2013; 09/2013   a. severe calcified POP-1 & TP Trunk Dz bilat;  b . 05/2013 Diamondback Rot Athrectomy (R POP) --> PTA R Pop, DES to R Peroneal;  c. 09/2013 PTA/Stenting of L Pop Clemencia Course - retrograde access);  d. 04/2014 ABI's: R/L: 1.1/1.1.   Preoperative cardiovascular examination 04/15/2020   PUD (peptic ulcer disease)     Rheumatoid arthritis (HCC)    S/P CABG x 3 1998   LIMA-LAD, SVG-OM, SVG-D1   Severe claudication (HCC) 05/2013   Referred for Peripheral Angio (Dr. Allyson Sabal --> Dr. Hoy Finlay in [place])   [facility] ALLERGY 01/14/2010   Qualifier: Diagnosis of  By: Candice Camp CMA (AAMA), [name]     Status post total replacement of right hip 10/12/2018   Vertigo 01/02/2014    Patient Active Problem List   Diagnosis Date Noted   Pincer nail deformity 06/13/2023   Orthostatic hypotension 05/05/2023   Leg wound, right 05/04/2023   Pain in right hip 11/29/2022   Right arm pain 11/29/2022   Hypertensive urgency 10/20/2022   Nausea and vomiting 10/20/2022   Anxiety 11/04/2021   Avascular necrosis (HCC) 11/04/2021   Chronic interstitial cystitis 11/04/2021   Diabetic peripheral neuropathy associated with type 2 diabetes mellitus (HCC) 11/04/2021   Polyneuropathy due to type 2 diabetes mellitus (HCC) 11/04/2021   Eczema 11/04/2021   History of urinary tract infection 11/04/2021   Neuropathy 11/04/2021   Other long term (current) drug therapy 11/04/2021   Peptic ulcer disease 11/04/2021   Presence of aortocoronary bypass graft 11/04/2021   Skin sensation disturbance 11/04/2021   Vitamin D deficiency 11/04/2021   Gastroesophageal reflux disease 11/04/2021   Rheumatoid arthritis (HCC) 11/04/2021   Peripheral vascular disease (HCC) 11/04/2021   Type 2 diabetes mellitus with other specified complication (HCC) 11/04/2021   Chronic diastolic CHF (congestive heart failure) (HCC) 01/07/2021   Status post total hip replacement, right 12/04/2020   Status post total replacement of left hip 11/21/2020   Near syncope 07/10/2020   Hx of CABG 07/10/2020   Preoperative cardiovascular examination 04/15/2020   Chest pain 03/18/2020   Unilateral primary osteoarthritis, left hip 04/03/2019   History of right hip replacement 04/03/2019   Status post total replacement of right hip 10/12/2018   Unilateral primary  osteoarthritis, right hip 09/12/2018   Degenerative joint disease 05/30/2018   Anal fissure 11/10/2017   Rectal bleeding 11/10/2017   Rectal pain 11/10/2017   Inflammatory arthritis 10/13/2017   Latent tuberculosis by blood test 10/13/2017   Chronic diastolic CHF (congestive heart failure), NYHA class 2 (HCC) 08/11/2017   Fatigue due to treatment 01/09/2017   NSTEMI (non-ST elevated myocardial infarction) (HCC) 11/01/2016   Diabetes mellitus (HCC) 10/24/2015   Non-insulin dependent type 2 diabetes mellitus (HCC) 10/24/2015   Angina, class I (HCC) 04/22/2015   DOE (dyspnea on exertion) 04/22/2015   Bilateral leg edema 04/22/2015   Accumulation of fluid in tissues 03/21/2014   Vertigo, central 01/02/2014   Vertigo 01/02/2014   Limb pain- echymosis, pain Lt radial artery after cath 12/11/2013   Dyslipidemia, goal LDL below 70 05/17/2013   Essential hypertension 05/17/2013   Claudication (HCC) 05/16/2013   PTA/ Stent Rt popliteal and Rt peroneal artery 05/16/13 05/16/2013   PAD,severe calcified pop and tibial peroneal trunk  disease bilateraly    ABDOMINAL PAIN RIGHT LOWER QUADRANT 10/07/2010   CVA-STROKE 03/04/2010   BARRETT'S ESOPHAGUS 03/04/2010   FECAL INCONTINENCE 03/04/2010   DM 01/14/2010   Anemia of chronic disease 01/14/2010   CAD (coronary artery disease) 01/14/2010   DIVERTICULOSIS OF COLON 01/14/2010   PERSONAL HISTORY OF COLONIC POLYPS 01/14/2010   SHELLFISH ALLERGY 01/14/2010    Past Surgical History:  Procedure Laterality Date   ATHERECTOMY Right 05/15/2013   Procedure: ATHERECTOMY;  Surgeon: Runell Gess, MD;  Location: [facility] CATH LAB;  Service: Cardiovascular;  Laterality: Right;  popliteal   BACK SURGERY     CARDIAC CATHETERIZATION  07/17/01   2 v CAD with LM, circ, LAD, obtuse diag, mod stenosis of diag vein graft , mild stenosis of marg vein graft, nl EF, medical treatment   CARDIAC CATHETERIZATION  11/2013   Widely patent LIMA-LAD & SVG-OM with mild progression  of proximal stenosis ~40-50% in SVG-D1; Widely patent native RCA with known severe native LCA disease   CARDIAC CATHETERIZATION N/A 11/01/2016   Procedure: Left Heart Cath and Cors/Grafts Angiography;  Surgeon: [name], MD;  Location: [facility] INVASIVE CV LAB;  Service: Cardiovascular: Hemodynamics: High LVEDP!.  Cors/Grafts: LM 55%, o-p LAD 100% then after D1 -> LIMA-LAD patent, SVG-Diag ~55%. small RI wiht ~70% ostial. O-p Cx 80% - pCx 70% --> SVG-bifurcating OM patent. -- med Rx   CORONARY ANGIOPLASTY  1992   PCI to LAD   CORONARY ARTERY BYPASS GRAFT  1998   LIMA-LAD, SVG-diagonal (none roughly 50% stenosis), SVG-OM   LEFT HEART CATHETERIZATION WITH CORONARY ANGIOGRAM N/A 11/27/2013   Procedure: LEFT HEART CATHETERIZATION WITH CORONARY ANGIOGRAM;  Surgeon: [name], MD;  Location: [facility] CATH LAB;  Service: Cardiovascular;  Laterality: N/A;   LOW EXTREMITY DOPPLERS/ABI  10/2016   Patent right SFA, popliteal and peroneal tendons. Patent left SFA and popliteal stent. 30-50% stenosis in the right common femoral and popliteal arteries, 50-74% stenosis in left profunda femoris artery. --> 1 year follow-up.  RABI - 1.2, LABI 1.1   LOWER EXTREMITY ANGIOGRAM N/A 02/22/2013   Procedure: LOWER EXTREMITY ANGIOGRAM;  Surgeon: Runell Gess, MD;  Location: [facility] CATH LAB;  Service: Cardiovascular;  Laterality: N/A;   LOWER EXTREMITY ANGIOGRAM N/A 07/20/2013   Procedure: LOWER EXTREMITY ANGIOGRAM;  Surgeon: Runell Gess, MD;  Location: [facility] CATH LAB;  Service: Cardiovascular;  Laterality: N/A;   NM MYOVIEW LTD  04/03/2013; 5/26'/2016   Lexiscan: a)  Apical perfusion defect with no ischemia. Consider prior infarct versus breast attenuation.;; b) 5/'16: LOW RISK, Nl EF ~55-65%, No Ischemia /Infarction   NM MYOVIEW LTD  10/'19; 4/'21   a) LOW RISK.  EF 55%.  No ischemia or infarction. b) EF estimated 81%.  Suboptimal images.  May be subtle inferior ischemia-initially read by radiology as intermediate risk,  however overread by cardiology to be mild low risk.Marland Kitchen   PERCUTANEOUS STENT INTERVENTION Right 05/15/2013   Procedure: PERCUTANEOUS STENT INTERVENTION;  Surgeon: Runell Gess, MD;  Location: [facility] CATH LAB;  Service: Cardiovascular;  Laterality: Right;  tibioperoneal trunk   Peroneal Artery Stent  05/15/13   Diamondback rot. atherectomy of high grade segmental popliteal stenosis then Chocolate balloonPTA; then angiosculpt PTA of the prox peroneal with stent-Xpedition    pv angio  07/20/13   high-grade calcified popliteal disease with one-vessel runoff via a small anterior tibial artery that has proximal disease as well, no intervention   TOTAL HIP ARTHROPLASTY Right 10/12/2018   Procedure:  RIGHT TOTAL HIP ARTHROPLASTY ANTERIOR APPROACH;  Surgeon: Kathryne Hitch, MD;  Location: [facility] ORS;  Service: Orthopedics;  Laterality: Right;   TOTAL HIP ARTHROPLASTY Left 11/21/2020   Procedure: LEFT TOTAL HIP ARTHROPLASTY ANTERIOR APPROACH;  Surgeon: Kathryne Hitch, MD;  Location: [facility] ORS;  Service: Orthopedics;  Laterality: Left;   TRANSTHORACIC ECHOCARDIOGRAM  11/'17; 10/19   a) EF 55-60%. Gr 1 DD. Normal PAP. Aortic Sclerosis.;; b) Normal LV size and function with vigorous EF 65 to 70%.  GR 1 DD.  Severe LA dilation (suggestive of probably worsening: GR 1 DD).   TRANSTHORACIC ECHOCARDIOGRAM  03/18/2020   EF 70-75%.  Hyperdynamic.  GR 1 DD.  No R WMA.  Mild LA dilation.  Mild aortic valve sclerosis but no stenosis.-No change from prior echo   TUBAL LIGATION      OB History   No obstetric history on file.      Home Medications    Prior to Admission medications   Medication Sig Start Date End Date Taking? Authorizing Provider  carvedilol (COREG) 6.25 MG tablet TAKE 1 TABLET BY MOUTH TWICE A DAY Patient taking differently: Take 6.25 mg by mouth 2 (two) times daily with a meal. 12/07/22  Yes Runell Gess, MD  Cholecalciferol (VITAMIN D3) 10 MCG (400 UNIT) tablet Take 400 Units by mouth  daily.   Yes [provider]  clopidogrel (PLAVIX) 75 MG tablet Take 75 mg by mouth at bedtime.   Yes [provider]  denosumab (PROLIA) 60 MG/ML SOSY injection Inject 60 mg into the skin every 6 (six) months.   Yes [provider]  doxycycline (VIBRAMYCIN) 100 MG capsule Take 1 capsule (100 mg total) by mouth 2 (two) times daily for 5 days. 08/27/23 09/01/23 Yes Doreene Eland, MD  ezetimibe (ZETIA) 10 MG tablet Take 10 mg by mouth at bedtime.   Yes [provider]  folic acid (FOLVITE) 1 MG tablet Take 1 mg by mouth daily. 08/21/18  Yes [provider]  gabapentin (NEURONTIN) 400 MG capsule Take 400 mg by mouth 2 (two) times daily. 09/07/22  Yes [provider]  glimepiride (AMARYL) 2 MG tablet Take 2 mg by mouth daily with breakfast. 01/07/22  Yes [provider]  hydrALAZINE (APRESOLINE) 25 MG tablet Take 1 tablet (25 mg total) by mouth every 8 (eight) hours. 05/06/23  Yes Osvaldo Shipper, MD  isosorbide mononitrate (IMDUR) 60 MG 24 hr tablet TAKE 1.5 TABLETS (90 MG TOTAL) BY MOUTH DAILY. Marland Kitchen Patient taking differently: Take 60 mg by mouth daily. . 06/03/23  Yes Runell Gess, MD  LORazepam (ATIVAN) 0.5 MG tablet Take 0.5 mg by mouth daily as needed. 12/04/21  Yes [provider]  methotrexate (RHEUMATREX) 2.5 MG tablet Take 20 mg by mouth every Sunday. (8 tablets) 08/21/18  Yes [provider]  predniSONE (DELTASONE) 1 MG tablet Take 4 mg by mouth daily. 05/02/23  Yes [provider]  rosuvastatin (CRESTOR) 40 MG tablet TAKE 1 TABLET BY MOUTH EVERYDAY AT BEDTIME Patient taking differently: Take 40 mg by mouth at bedtime. 01/16/21  Yes Runell Gess, MD  triamcinolone cream (KENALOG) 0.5 % Apply 1 Application topically 2 (two) times daily for 8 days. 08/27/23 09/04/23 Yes Doreene Eland, MD  acetaminophen (TYLENOL) 650 MG CR tablet Take 650 mg by mouth daily as needed for pain.    [provider]   famotidine (PEPCID) 40 MG tablet Take 40 mg by mouth at bedtime.  [provider]  nitroGLYCERIN (NITROSTAT) 0.4 MG SL tablet PLACE 1 TABLET (0.4 MG TOTAL) UNDER THE TONGUE EVERY 5 (FIVE) MINUTES AS NEEDED FOR CHEST PAIN. 02/13/18   Creig Hines, NP    Family History Family History  Adopted: Yes  Problem Relation Age of Onset   Hypertension Mother    Hyperlipidemia Mother    Hyperlipidemia Father    Hypertension Father     Social History Social History   Tobacco Use   Smoking status: Never   Smokeless tobacco: Never  Vaping Use   Vaping status: Never Used  Substance Use Topics   Alcohol use: Not Currently   Drug use: No     Allergies   Fish allergy, Iodinated contrast media, Latex, Shellfish allergy, Amlodipine, Evolocumab, Other, and Metformin   Review of Systems Review of Systems  Constitutional:  Negative for fever.  Respiratory:  Negative for shortness of breath.   Cardiovascular:  Negative for chest pain.  Skin:  Positive for rash.  Neurological:  Negative for headaches.  All other systems reviewed and are negative.    Physical Exam Triage Vital Signs ED Triage Vitals  Encounter Vitals Group     BP      Systolic BP Percentile      Diastolic BP Percentile      Pulse      Resp      Temp      Temp src      SpO2      Weight      Height      Head Circumference      Peak Flow      Pain Score      Pain Loc      Pain Education      Exclude from Growth Chart    No data found.  Updated Vital Signs BP (!) 181/71 (BP Location: Right Arm)   Pulse 66   Temp 98.1 F (36.7 C)   Resp 16   SpO2 94%   Visual Acuity Right Eye Distance:   Left Eye Distance:   Bilateral Distance:    Right Eye Near:   Left Eye Near:    Bilateral Near:     Physical Exam Vitals and nursing note reviewed.  Eyes:     Extraocular Movements: Extraocular movements intact.     Conjunctiva/sclera: Conjunctivae normal.  Cardiovascular:     Rate and  Rhythm: Normal rate and regular rhythm.     Heart sounds: Normal heart sounds. No murmur heard. Pulmonary:     Effort: Pulmonary effort is normal. No respiratory distress.     Breath sounds: Normal breath sounds. No wheezing.  Skin:    Comments: Large blister on her right shin with clear fluid with mild surrounding erythema. Otherwise, no leg swelling  Neurological:     General: No focal deficit present.      UC Treatments / Results  Labs (all labs ordered are listed, but only abnormal results are displayed) Labs Reviewed - No data to display  EKG   Radiology No results found.  Procedures Procedures (including critical care time)  Medications Ordered in UC Medications - No data to display  Initial Impression / Assessment and Plan / UC Course  I have reviewed the triage vital signs and the nursing notes.  Pertinent labs & imaging results that were available during my care of the patient were reviewed by me and considered in my medical decision making (see chart for details).  Clinical  Course as of 08/27/23 1608  Sat Aug 27, 2023  1605 Blister ?? Bullous pemphigoid vs. drug reaction No recent change in her medications The patient need an autoimmune workup with her PCP Option to drain vs watch and wait discussed Given the previous infection with drainage, we agreed not to drain today. Unable to give her oral steroids due to DM2 Topical steroids with oral Doxycycline prescribed Close monitoring with PCP discussed F/U with [place] if symptoms worsen [KE]  1607 HTN Asymptomatic Poor adherence to medication due to medication confusion I gave her medication instruction PCP f/u in a few days for med review and adjustment She agreed with the plan [KE]    Clinical Course User Index [KE] Doreene Eland, MD    Final Clinical Impressions(s) / UC Diagnoses   Final diagnoses:  Elevated blood pressure reading  Essential hypertension  Blister     Discharge Instructions       It was nice seeing you. I am sorry about your skin blisters. Sometimes, we have blisters when we have autoimmune diseases or when it is medication-related. I will start you on topical steroids and antibiotics for this. The blister should pop by itself. If it worsens please see [place] soon. Follow up with your PCP to screen you for autoimmune diseases and also review your medication that may cause blistering.  Also, your BP medications include Hydralazine 25 mg three times daily, Coreg 6.25 mg BID, and Imdure one and a half tablets daily. See PCP soon for medication management.     ED Prescriptions     Medication Sig Dispense Auth. Provider   triamcinolone cream (KENALOG) 0.5 % Apply 1 Application topically 2 (two) times daily for 8 days. 15 g Janit Pagan T, MD   doxycycline (VIBRAMYCIN) 100 MG capsule Take 1 capsule (100 mg total) by mouth 2 (two) times daily for 5 days. 10 capsule Doreene Eland, MD      PDMP not reviewed this encounter.   Doreene Eland, MD 08/27/23 [phone]

## 2023-08-27 NOTE — ED Notes (Signed)
Pt inquiring about dressing for blistered area of RLE. Discussed with Dr Lum Babe -- Dr Lum Babe prefers that pt not use a dressing, as it will apply pressure to the blister. Discussed with pt and family member; pt will use dressing if Rx cream is getting on her clothing, or if/when the blister pops. Dressing supplies provided to pt.

## 2023-08-29 ENCOUNTER — Ambulatory Visit: Payer: Medicare Other | Admitting: Physician Assistant

## 2023-08-29 ENCOUNTER — Encounter: Payer: Self-pay | Admitting: Physician Assistant

## 2023-08-29 DIAGNOSIS — S80821S Blister (nonthermal), right lower leg, sequela: Secondary | ICD-10-CM

## 2023-08-29 DIAGNOSIS — T148XXA Other injury of unspecified body region, initial encounter: Secondary | ICD-10-CM

## 2023-08-29 MED ORDER — DOXYCYCLINE HYCLATE 100 MG PO TABS: 100.0000 mg | ORAL_TABLET | Freq: Two times a day (BID) | ORAL | 0 refills | Status: AC

## 2023-08-29 NOTE — Progress Notes (Signed)
Office Visit Note   Patient: [name]           Date of Birth: 11/05/1933           MRN: [id number] Visit Date: 08/29/2023              Requested by: [name] [address] [place],  [place] [address] PCP: [name]   Assessment & Plan: Visit Diagnoses:  1. [name] lesion   2. Blister of lower leg, right, sequela     Plan: We did place her on doxycycline 100 mg twice daily for 14 days.  She had not picked up the 5-day supply of doxycycline.  She will not pick up the 5-day supply.  We explained this to the patient and called the pharmacy to clarify the prescription.  In regards to the [facility] lesion she we will apply heat to this.  I explained to her that at this point in time it is most likely has coagulated.  Attempted aspiration of the right hip and getting a dry tap today.  Regards to the right lower leg blister this was unroofed today Xeroform was applied Tegaderm.  Wrapped with an Ace bandage she can remove the Ace bandage.  She was given extra Tegaderm dressings that she can apply she is to leave the Xeroform intact until she follows up with Dr. Magnus Flores next week.  Questions were encouraged and answered at length.  Follow-Up Instructions: No follow-ups on file.   Orders:  No orders of the defined types were placed in this encounter.  Meds ordered this encounter  Medications   doxycycline (VIBRA-TABS) 100 MG tablet    Sig: Take 1 tablet (100 mg total) by mouth 2 (two) times daily for 14 days.    Dispense:  28 tablet    Refill:  0      Procedures: No procedures performed   Clinical Data: No additional findings.   Subjective: Chief Complaint  Patient presents with   Right Hip - Pain, Follow-up    Asp 250 cc 08/04/23    HPI [name] returns today for a blister that is came up on her left lower leg distal one fourth of the shaft.  She was seen in the ER the blister was left in place she was placed on doxycycline  which she did not pick up.  She also unfortunately still having pain right hip where significant swelling after falling to avoid being hit by car door on August 5.  She states that the hip is still painful with improving.  She was to follow-up with [place] after the aspiration of the lesion on 08/04/2023 but was unable to make it back to our office.  She has had no new injuries.  Review of Systems See HPI otherwise negative  Objective: Vital Signs: There were no vitals taken for this visit.  Physical Exam General: Well-developed well-nourished female who ambulates without any assistive device. Ortho Exam Right hip: Slight edema.  There is no ecchymosis or erythema.  No abnormal warmth. Right lower leg: Large blister over the anterior distal one fourth of the right lower leg.  Specialty Comments:  No specialty comments available.  Imaging: No results found.   PMFS History: Patient Active Problem List   Diagnosis Date Noted   Pincer nail deformity 06/13/2023   Orthostatic hypotension 05/05/2023   Leg wound, right 05/04/2023   Pain in right hip 11/29/2022   Right arm pain 11/29/2022  Hypertensive urgency 10/20/2022   Nausea and vomiting 10/20/2022   Anxiety 11/04/2021   Avascular necrosis (HCC) 11/04/2021   Chronic interstitial cystitis 11/04/2021   Diabetic peripheral neuropathy associated with type 2 diabetes mellitus (HCC) 11/04/2021   Polyneuropathy due to type 2 diabetes mellitus (HCC) 11/04/2021   Eczema 11/04/2021   History of urinary tract infection 11/04/2021   Neuropathy 11/04/2021   Other long term (current) drug therapy 11/04/2021   Peptic ulcer disease 11/04/2021   Presence of aortocoronary bypass graft 11/04/2021   Skin sensation disturbance 11/04/2021   Vitamin D deficiency 11/04/2021   Gastroesophageal reflux disease 11/04/2021   Rheumatoid arthritis (HCC) 11/04/2021   Peripheral vascular disease (HCC) 11/04/2021   Type 2 diabetes mellitus with other specified  complication (HCC) 11/04/2021   Chronic diastolic CHF (congestive heart failure) (HCC) 01/07/2021   Status post total hip replacement, right 12/04/2020   Status post total replacement of left hip 11/21/2020   Near syncope 07/10/2020   Hx of CABG 07/10/2020   Preoperative cardiovascular examination 04/15/2020   Chest pain 03/18/2020   Unilateral primary osteoarthritis, left hip 04/03/2019   History of right hip replacement 04/03/2019   Status post total replacement of right hip 10/12/2018   Unilateral primary osteoarthritis, right hip 09/12/2018   Degenerative joint disease 05/30/2018   Anal fissure 11/10/2017   Rectal bleeding 11/10/2017   Rectal pain 11/10/2017   Inflammatory arthritis 10/13/2017   Latent tuberculosis by blood test 10/13/2017   Chronic diastolic CHF (congestive heart failure), NYHA class 2 (HCC) 08/11/2017   Fatigue due to treatment 01/09/2017   NSTEMI (non-ST elevated myocardial infarction) (HCC) 11/01/2016   Diabetes mellitus (HCC) 10/24/2015   Non-insulin dependent type 2 diabetes mellitus (HCC) 10/24/2015   Angina, class I (HCC) 04/22/2015   DOE (dyspnea on exertion) 04/22/2015   Bilateral leg edema 04/22/2015   Accumulation of fluid in tissues 03/21/2014   Vertigo, central 01/02/2014   Vertigo 01/02/2014   Limb pain- echymosis, pain Lt radial artery after cath 12/11/2013   Dyslipidemia, goal LDL below 70 05/17/2013   Essential hypertension 05/17/2013   Claudication (HCC) 05/16/2013   PTA/ Stent Rt popliteal and Rt peroneal artery 05/16/13 05/16/2013   PAD,severe calcified pop and tibial peroneal trunk disease bilateraly    ABDOMINAL PAIN RIGHT LOWER QUADRANT 10/07/2010   CVA-STROKE 03/04/2010   BARRETT'S ESOPHAGUS 03/04/2010   FECAL INCONTINENCE 03/04/2010   DM 01/14/2010   Anemia of chronic disease 01/14/2010   CAD (coronary artery disease) 01/14/2010   DIVERTICULOSIS OF COLON 01/14/2010   PERSONAL HISTORY OF COLONIC POLYPS 01/14/2010   SHELLFISH  ALLERGY 01/14/2010   Past Medical History:  Diagnosis Date   Allergy to IVP dye    Angina, class I (HCC) 04/22/2015   Atherosclerotic heart disease native coronary artery w/angina pectoris (HCC) 1992   a. 1992 s/p PTCA of LAD;  b. 1998 CABG x 3; c. 07/2001 Cath: Sev LM/LAD/LCX dzs, 3/3 patent grafts; d. 11/2013 Cath: stable graft anatomy; e. 10/2016 Cath: native 3VD, VG->OM nl, LIMA->LAD nl, VG->Diag 55.   Atherosclerotic heart disease of native coronary artery with angina pectoris (HCC) 01/14/2010   Qualifier: Diagnosis of  By: Arlyce Dice MD, Barbette Hair    Atrial septal aneurysm    Barrett's esophagus 03/04/2010   Qualifier: Diagnosis of  By: Myrtie Hawk, Amy S    Bilateral leg edema 04/22/2015   Bradycardia, mild briefly to the 40s, asymptomatic 05/16/2013   Carotid arterial disease (HCC)    a. 05/2014 Carotid U/S: No  signif bilat ICA stenosis, >60% L ECA.   Chronic anemia    Chronic diastolic CHF (congestive heart failure) (HCC)    a. 10/2016 Echo: Ef 55-60%, no rwma, Gr1 DD, triv AI, PASP , atrial septal aneurysm.   Chronic diastolic CHF (congestive heart failure), NYHA class 2 (HCC) 08/11/2017   Claudication (HCC) 05/16/2013   Peripheral arterial occlusive disease with lifestyle limiting claudication    Degenerative joint disease 05/30/2018   DIVERTICULOSIS OF COLON 01/14/2010   Qualifier: Diagnosis of  By: Arlyce Dice MD, Barbette Hair    DM (diabetes mellitus), type 2 with peripheral vascular complications (HCC)    On Oral medications.   DOE (dyspnea on exertion) 04/22/2015   Essential hypertension    FECAL INCONTINENCE 03/04/2010   Qualifier: Diagnosis of  By: Monica Becton PA-c, Amy S    GERD (gastroesophageal reflux disease)    Hyperlipidemia with target LDL less than 70    Inflammatory arthritis 10/13/2017   Neuropathy    Non-insulin dependent type 2 diabetes mellitus (HCC) 10/24/2015   NSTEMI (non-ST elevated myocardial infarction) (HCC) 11/01/2016   PAD (peripheral artery  disease) (HCC) 05/2013; 09/2013   a. severe calcified POP-1 & TP Trunk Dz bilat;  b . 05/2013 Diamondback Rot Athrectomy (R POP) --> PTA R Pop, DES to R Peroneal;  c. 09/2013 PTA/Stenting of L Pop Clemencia Course - retrograde access);  d. 04/2014 ABI's: R/L: 1.1/1.1.   Preoperative cardiovascular examination 04/15/2020   PUD (peptic ulcer disease)    Rheumatoid arthritis (HCC)    S/P CABG x 3 1998   LIMA-LAD, SVG-OM, SVG-D1   Severe claudication (HCC) 05/2013   Referred for Peripheral Angio (Dr. Allyson Sabal --> Dr. Hoy Finlay in [place])   [facility] ALLERGY 01/14/2010   Qualifier: Diagnosis of  By: Candice Camp CMA (AAMA), [name]     Status post total replacement of right hip 10/12/2018   Vertigo 01/02/2014    Family History  Adopted: Yes  Problem Relation Age of Onset   Hypertension Mother    Hyperlipidemia Mother    Hyperlipidemia Father    Hypertension Father     Past Surgical History:  Procedure Laterality Date   ATHERECTOMY Right 05/15/2013   Procedure: ATHERECTOMY;  Surgeon: Runell Gess, MD;  Location: [facility] CATH LAB;  Service: Cardiovascular;  Laterality: Right;  popliteal   BACK SURGERY     CARDIAC CATHETERIZATION  07/17/01   2 v CAD with LM, circ, LAD, obtuse diag, mod stenosis of diag vein graft , mild stenosis of marg vein graft, nl EF, medical treatment   CARDIAC CATHETERIZATION  11/2013   Widely patent LIMA-LAD & SVG-OM with mild progression of proximal stenosis ~40-50% in SVG-D1; Widely patent native RCA with known severe native LCA disease   CARDIAC CATHETERIZATION N/A 11/01/2016   Procedure: Left Heart Cath and Cors/Grafts Angiography;  Surgeon: [name], MD;  Location: [facility] INVASIVE CV LAB;  Service: Cardiovascular: Hemodynamics: High LVEDP!.  Cors/Grafts: LM 55%, o-p LAD 100% then after D1 -> LIMA-LAD patent, SVG-Diag ~55%. small RI wiht ~70% ostial. O-p Cx 80% - pCx 70% --> SVG-bifurcating OM patent. -- med Rx   CORONARY ANGIOPLASTY  1992   PCI to LAD   CORONARY ARTERY BYPASS  GRAFT  1998   LIMA-LAD, SVG-diagonal (none roughly 50% stenosis), SVG-OM   LEFT HEART CATHETERIZATION WITH CORONARY ANGIOGRAM N/A 11/27/2013   Procedure: LEFT HEART CATHETERIZATION WITH CORONARY ANGIOGRAM;  Surgeon: [name], MD;  Location: [facility] CATH LAB;  Service: Cardiovascular;  Laterality: N/A;  LOW EXTREMITY DOPPLERS/ABI  10/2016   Patent right SFA, popliteal and peroneal tendons. Patent left SFA and popliteal stent. 30-50% stenosis in the right common femoral and popliteal arteries, 50-74% stenosis in left profunda femoris artery. --> 1 year follow-up.  RABI - 1.2, LABI 1.1   LOWER EXTREMITY ANGIOGRAM N/A 02/22/2013   Procedure: LOWER EXTREMITY ANGIOGRAM;  Surgeon: Runell Gess, MD;  Location: [facility] CATH LAB;  Service: Cardiovascular;  Laterality: N/A;   LOWER EXTREMITY ANGIOGRAM N/A 07/20/2013   Procedure: LOWER EXTREMITY ANGIOGRAM;  Surgeon: Runell Gess, MD;  Location: [facility] CATH LAB;  Service: Cardiovascular;  Laterality: N/A;   NM MYOVIEW LTD  04/03/2013; 5/26'/2016   Lexiscan: a)  Apical perfusion defect with no ischemia. Consider prior infarct versus breast attenuation.;; b) 5/'16: LOW RISK, Nl EF ~55-65%, No Ischemia /Infarction   NM MYOVIEW LTD  10/'19; 4/'21   a) LOW RISK.  EF 55%.  No ischemia or infarction. b) EF estimated 81%.  Suboptimal images.  May be subtle inferior ischemia-initially read by radiology as intermediate risk, however overread by cardiology to be mild low risk.Marland Kitchen   PERCUTANEOUS STENT INTERVENTION Right 05/15/2013   Procedure: PERCUTANEOUS STENT INTERVENTION;  Surgeon: Runell Gess, MD;  Location: [facility] CATH LAB;  Service: Cardiovascular;  Laterality: Right;  tibioperoneal trunk   Peroneal Artery Stent  05/15/13   Diamondback rot. atherectomy of high grade segmental popliteal stenosis then Chocolate balloonPTA; then angiosculpt PTA of the prox peroneal with stent-Xpedition    pv angio  07/20/13   high-grade calcified popliteal disease with one-vessel runoff via a  small anterior tibial artery that has proximal disease as well, no intervention   TOTAL HIP ARTHROPLASTY Right 10/12/2018   Procedure: RIGHT TOTAL HIP ARTHROPLASTY ANTERIOR APPROACH;  Surgeon: Kathryne Hitch, MD;  Location: [facility] ORS;  Service: Orthopedics;  Laterality: Right;   TOTAL HIP ARTHROPLASTY Left 11/21/2020   Procedure: LEFT TOTAL HIP ARTHROPLASTY ANTERIOR APPROACH;  Surgeon: Kathryne Hitch, MD;  Location: [facility] ORS;  Service: Orthopedics;  Laterality: Left;   TRANSTHORACIC ECHOCARDIOGRAM  11/'17; 10/19   a) EF 55-60%. Gr 1 DD. Normal PAP. Aortic Sclerosis.;; b) Normal LV size and function with vigorous EF 65 to 70%.  GR 1 DD.  Severe LA dilation (suggestive of probably worsening: GR 1 DD).   TRANSTHORACIC ECHOCARDIOGRAM  03/18/2020   EF 70-75%.  Hyperdynamic.  GR 1 DD.  No R WMA.  Mild LA dilation.  Mild aortic valve sclerosis but no stenosis.-No change from prior echo   TUBAL LIGATION     Social History   Occupational History   Occupation: retired  Tobacco Use   Smoking status: Never   Smokeless tobacco: Never  Vaping Use   Vaping status: Never Used  Substance and Sexual Activity   Alcohol use: Not Currently   Drug use: No   Sexual activity: Not on file

## 2023-08-31 ENCOUNTER — Telehealth: Payer: Self-pay | Admitting: Physician Assistant

## 2023-08-31 NOTE — Telephone Encounter (Signed)
Pt called due to needing pain meds for her R leg pain please.  Dellamae Nuchols [phone]

## 2023-09-01 ENCOUNTER — Other Ambulatory Visit: Payer: Self-pay | Admitting: Orthopaedic Surgery

## 2023-09-01 MED ORDER — TRAMADOL HCL 50 MG PO TABS
50.0000 mg | ORAL_TABLET | Freq: Three times a day (TID) | ORAL | 0 refills | Status: DC | PRN
Start: 2023-09-01 — End: 2024-01-17

## 2023-09-01 NOTE — Telephone Encounter (Signed)
LMOm for patient this was sent in for her

## 2023-09-05 ENCOUNTER — Ambulatory Visit: Payer: Medicare Other | Admitting: Orthopedic Surgery

## 2023-09-05 ENCOUNTER — Encounter: Payer: Self-pay | Admitting: Orthopaedic Surgery

## 2023-09-05 DIAGNOSIS — S80821A Blister (nonthermal), right lower leg, initial encounter: Secondary | ICD-10-CM | POA: Diagnosis not present

## 2023-09-05 DIAGNOSIS — S80821S Blister (nonthermal), right lower leg, sequela: Secondary | ICD-10-CM

## 2023-09-05 DIAGNOSIS — T148XXA Other injury of unspecified body region, initial encounter: Secondary | ICD-10-CM

## 2023-09-07 ENCOUNTER — Ambulatory Visit: Payer: Medicare Other | Admitting: Orthopaedic Surgery

## 2023-09-11 ENCOUNTER — Encounter: Payer: Self-pay | Admitting: Orthopedic Surgery

## 2023-09-11 NOTE — Progress Notes (Signed)
Office Visit Note   Patient: [name]           Date of Birth: 11-24-1933           MRN: [id number] Visit Date: 09/05/2023              Requested by: Irena Reichmann, DO [address] [place],  [place] [address] PCP: Irena Reichmann, DO  Chief Complaint  Patient presents with   Right Lower Leg - Wound Check      HPI: Patient is an 89 year old woman who is seen for initial evaluation for ulceration right lower extremity.  Patient complains of pain and states that she is unable to wear a compression sock.  She is currently on doxycycline.  Assessment & Plan: Visit Diagnoses:  1. Norma Fredrickson lesion   2. Blister of lower leg, right, sequela     Plan: Patient is placed in a Dynaflex compression wrap.  Reevaluate in 1 week.  Follow-Up Instructions: Return in about 1 week (around 09/12/2023).   Ortho Exam  Patient is alert, oriented, no adenopathy, well-dressed, normal affect, normal respiratory effort. Examination patient has a palpable dorsalis pedis and posterior tibial pulse she has a venous ulcer anteriorly that measures 3 x 6 cm and 1 mm deep.  Her calf is 38 cm in circumference.  Imaging: No results found. No images are attached to the encounter.  Labs: Lab Results  Component Value Date   HGBA1C 8.6 (H) 05/04/2023   HGBA1C 8.4 (H) 10/20/2022   HGBA1C 7.3 (H) 11/17/2020   CRP 0.9 03/19/2020   REPTSTATUS 11/20/2020 FINAL 11/17/2020   CULT (A) 11/17/2020    20,000 COLONIES/mL KLEBSIELLA PNEUMONIAE 30,000 COLONIES/mL ESCHERICHIA COLI    LABORGA KLEBSIELLA PNEUMONIAE (A) 11/17/2020   LABORGA ESCHERICHIA COLI (A) 11/17/2020     Lab Results  Component Value Date   ALBUMIN 2.9 (L) 05/05/2023   ALBUMIN 3.6 05/04/2023   ALBUMIN 3.4 (L) 05/04/2023   PREALBUMIN 17 (L) 05/05/2023    Lab Results  Component Value Date   MG 2.1 05/05/2023   MG 2.2 05/04/2023   MG 2.2 11/17/2020   No results found for: "VD25OH"  Lab Results  Component Value  Date   PREALBUMIN 17 (L) 05/05/2023      Latest Ref Rng & Units 05/05/2023    4:52 AM 05/04/2023    2:33 PM 10/21/2022    4:20 AM  CBC EXTENDED  WBC 4.0 - 10.5 K/uL 5.9  6.0  5.8   RBC 3.87 - 5.11 MIL/uL 3.76  4.18  4.05   Hemoglobin 12.0 - 15.0 g/dL 29.5  28.4  13.2   HCT 36.0 - 46.0 % 34.1  38.3  36.5   Platelets 150 - 400 K/uL 258  315  208   NEUT# 1.7 - 7.7 K/uL  3.9    Lymph# 0.7 - 4.0 K/uL  1.3       There is no height or weight on file to calculate BMI.  Orders:  No orders of the defined types were placed in this encounter.  No orders of the defined types were placed in this encounter.    Procedures: No procedures performed  Clinical Data: No additional findings.  ROS:  All other systems negative, except as noted in the HPI. Review of Systems  Objective: Vital Signs: There were no vitals taken for this visit.  Specialty Comments:  No specialty comments available.  PMFS History: Patient Active Problem List   Diagnosis Date Noted  Pincer nail deformity 06/13/2023   Orthostatic hypotension 05/05/2023   Leg wound, right 05/04/2023   Pain in right hip 11/29/2022   Right arm pain 11/29/2022   Hypertensive urgency 10/20/2022   Nausea and vomiting 10/20/2022   Anxiety 11/04/2021   Avascular necrosis (HCC) 11/04/2021   Chronic interstitial cystitis 11/04/2021   Diabetic peripheral neuropathy associated with type 2 diabetes mellitus (HCC) 11/04/2021   Polyneuropathy due to type 2 diabetes mellitus (HCC) 11/04/2021   Eczema 11/04/2021   History of urinary tract infection 11/04/2021   Neuropathy 11/04/2021   Other long term (current) drug therapy 11/04/2021   Peptic ulcer disease 11/04/2021   Presence of aortocoronary bypass graft 11/04/2021   Skin sensation disturbance 11/04/2021   Vitamin D deficiency 11/04/2021   Gastroesophageal reflux disease 11/04/2021   Rheumatoid arthritis (HCC) 11/04/2021   Peripheral vascular disease (HCC) 11/04/2021   Type 2  diabetes mellitus with other specified complication (HCC) 11/04/2021   Chronic diastolic CHF (congestive heart failure) (HCC) 01/07/2021   Status post total hip replacement, right 12/04/2020   Status post total replacement of left hip 11/21/2020   Near syncope 07/10/2020   Hx of CABG 07/10/2020   Preoperative cardiovascular examination 04/15/2020   Chest pain 03/18/2020   Unilateral primary osteoarthritis, left hip 04/03/2019   History of right hip replacement 04/03/2019   Status post total replacement of right hip 10/12/2018   Unilateral primary osteoarthritis, right hip 09/12/2018   Degenerative joint disease 05/30/2018   Anal fissure 11/10/2017   Rectal bleeding 11/10/2017   Rectal pain 11/10/2017   Inflammatory arthritis 10/13/2017   Latent tuberculosis by blood test 10/13/2017   Chronic diastolic CHF (congestive heart failure), NYHA class 2 (HCC) 08/11/2017   Fatigue due to treatment 01/09/2017   NSTEMI (non-ST elevated myocardial infarction) (HCC) 11/01/2016   Diabetes mellitus (HCC) 10/24/2015   Non-insulin dependent type 2 diabetes mellitus (HCC) 10/24/2015   Angina, class I (HCC) 04/22/2015   DOE (dyspnea on exertion) 04/22/2015   Bilateral leg edema 04/22/2015   Accumulation of fluid in tissues 03/21/2014   Vertigo, central 01/02/2014   Vertigo 01/02/2014   Limb pain- echymosis, pain Lt radial artery after cath 12/11/2013   Dyslipidemia, goal LDL below 70 05/17/2013   Essential hypertension 05/17/2013   Claudication (HCC) 05/16/2013   PTA/ Stent Rt popliteal and Rt peroneal artery 05/16/13 05/16/2013   PAD,severe calcified pop and tibial peroneal trunk disease bilateraly    ABDOMINAL PAIN RIGHT LOWER QUADRANT 10/07/2010   CVA-STROKE 03/04/2010   BARRETT'S ESOPHAGUS 03/04/2010   FECAL INCONTINENCE 03/04/2010   DM 01/14/2010   Anemia of chronic disease 01/14/2010   CAD (coronary artery disease) 01/14/2010   DIVERTICULOSIS OF COLON 01/14/2010   PERSONAL HISTORY OF  COLONIC POLYPS 01/14/2010   SHELLFISH ALLERGY 01/14/2010   Past Medical History:  Diagnosis Date   Allergy to IVP dye    Angina, class I (HCC) 04/22/2015   Atherosclerotic heart disease native coronary artery w/angina pectoris (HCC) 1992   a. 1992 s/p PTCA of LAD;  b. 1998 CABG x 3; c. 07/2001 Cath: Sev LM/LAD/LCX dzs, 3/3 patent grafts; d. 11/2013 Cath: stable graft anatomy; e. 10/2016 Cath: native 3VD, VG->OM nl, LIMA->LAD nl, VG->Diag 55.   Atherosclerotic heart disease of native coronary artery with angina pectoris (HCC) 01/14/2010   Qualifier: Diagnosis of  By: Arlyce Dice MD, Barbette Hair    Atrial septal aneurysm    Barrett's esophagus 03/04/2010   Qualifier: Diagnosis of  By: Myrtie Hawk, Amy S  Bilateral leg edema 04/22/2015   Bradycardia, mild briefly to the 40s, asymptomatic 05/16/2013   Carotid arterial disease (HCC)    a. 05/2014 Carotid U/S: No signif bilat ICA stenosis, >60% L ECA.   Chronic anemia    Chronic diastolic CHF (congestive heart failure) (HCC)    a. 10/2016 Echo: Ef 55-60%, no rwma, Gr1 DD, triv AI, PASP , atrial septal aneurysm.   Chronic diastolic CHF (congestive heart failure), NYHA class 2 (HCC) 08/11/2017   Claudication (HCC) 05/16/2013   Peripheral arterial occlusive disease with lifestyle limiting claudication    Degenerative joint disease 05/30/2018   DIVERTICULOSIS OF COLON 01/14/2010   Qualifier: Diagnosis of  By: Arlyce Dice MD, Barbette Hair    DM (diabetes mellitus), type 2 with peripheral vascular complications (HCC)    On Oral medications.   DOE (dyspnea on exertion) 04/22/2015   Essential hypertension    FECAL INCONTINENCE 03/04/2010   Qualifier: Diagnosis of  By: Monica Becton PA-c, Amy S    GERD (gastroesophageal reflux disease)    Hyperlipidemia with target LDL less than 70    Inflammatory arthritis 10/13/2017   Neuropathy    Non-insulin dependent type 2 diabetes mellitus (HCC) 10/24/2015   NSTEMI (non-ST elevated myocardial infarction) (HCC)  11/01/2016   PAD (peripheral artery disease) (HCC) 05/2013; 09/2013   a. severe calcified POP-1 & TP Trunk Dz bilat;  b . 05/2013 Diamondback Rot Athrectomy (R POP) --> PTA R Pop, DES to R Peroneal;  c. 09/2013 PTA/Stenting of L Pop Clemencia Course - retrograde access);  d. 04/2014 ABI's: R/L: 1.1/1.1.   Preoperative cardiovascular examination 04/15/2020   PUD (peptic ulcer disease)    Rheumatoid arthritis (HCC)    S/P CABG x 3 1998   LIMA-LAD, SVG-OM, SVG-D1   Severe claudication (HCC) 05/2013   Referred for Peripheral Angio (Dr. Allyson Sabal --> Dr. Hoy Finlay in [place])   [facility] ALLERGY 01/14/2010   Qualifier: Diagnosis of  By: Candice Camp CMA (AAMA), [name]     Status post total replacement of right hip 10/12/2018   Vertigo 01/02/2014    Family History  Adopted: Yes  Problem Relation Age of Onset   Hypertension Mother    Hyperlipidemia Mother    Hyperlipidemia Father    Hypertension Father     Past Surgical History:  Procedure Laterality Date   ATHERECTOMY Right 05/15/2013   Procedure: ATHERECTOMY;  Surgeon: Runell Gess, MD;  Location: [facility] CATH LAB;  Service: Cardiovascular;  Laterality: Right;  popliteal   BACK SURGERY     CARDIAC CATHETERIZATION  07/17/01   2 v CAD with LM, circ, LAD, obtuse diag, mod stenosis of diag vein graft , mild stenosis of marg vein graft, nl EF, medical treatment   CARDIAC CATHETERIZATION  11/2013   Widely patent LIMA-LAD & SVG-OM with mild progression of proximal stenosis ~40-50% in SVG-D1; Widely patent native RCA with known severe native LCA disease   CARDIAC CATHETERIZATION N/A 11/01/2016   Procedure: Left Heart Cath and Cors/Grafts Angiography;  Surgeon: [name], MD;  Location: [facility] INVASIVE CV LAB;  Service: Cardiovascular: Hemodynamics: High LVEDP!.  Cors/Grafts: LM 55%, o-p LAD 100% then after D1 -> LIMA-LAD patent, SVG-Diag ~55%. small RI wiht ~70% ostial. O-p Cx 80% - pCx 70% --> SVG-bifurcating OM patent. -- med Rx   CORONARY ANGIOPLASTY  1992    PCI to LAD   CORONARY ARTERY BYPASS GRAFT  1998   LIMA-LAD, SVG-diagonal (none roughly 50% stenosis), SVG-OM   LEFT HEART CATHETERIZATION WITH CORONARY ANGIOGRAM N/A  11/27/2013   Procedure: LEFT HEART CATHETERIZATION WITH CORONARY ANGIOGRAM;  Surgeon: [name], MD;  Location: [facility] CATH LAB;  Service: Cardiovascular;  Laterality: N/A;   LOW EXTREMITY DOPPLERS/ABI  10/2016   Patent right SFA, popliteal and peroneal tendons. Patent left SFA and popliteal stent. 30-50% stenosis in the right common femoral and popliteal arteries, 50-74% stenosis in left profunda femoris artery. --> 1 year follow-up.  RABI - 1.2, LABI 1.1   LOWER EXTREMITY ANGIOGRAM N/A 02/22/2013   Procedure: LOWER EXTREMITY ANGIOGRAM;  Surgeon: Runell Gess, MD;  Location: [facility] CATH LAB;  Service: Cardiovascular;  Laterality: N/A;   LOWER EXTREMITY ANGIOGRAM N/A 07/20/2013   Procedure: LOWER EXTREMITY ANGIOGRAM;  Surgeon: Runell Gess, MD;  Location: [facility] CATH LAB;  Service: Cardiovascular;  Laterality: N/A;   NM MYOVIEW LTD  04/03/2013; 5/26'/2016   Lexiscan: a)  Apical perfusion defect with no ischemia. Consider prior infarct versus breast attenuation.;; b) 5/'16: LOW RISK, Nl EF ~55-65%, No Ischemia /Infarction   NM MYOVIEW LTD  10/'19; 4/'21   a) LOW RISK.  EF 55%.  No ischemia or infarction. b) EF estimated 81%.  Suboptimal images.  May be subtle inferior ischemia-initially read by radiology as intermediate risk, however overread by cardiology to be mild low risk.Marland Kitchen   PERCUTANEOUS STENT INTERVENTION Right 05/15/2013   Procedure: PERCUTANEOUS STENT INTERVENTION;  Surgeon: Runell Gess, MD;  Location: [facility] CATH LAB;  Service: Cardiovascular;  Laterality: Right;  tibioperoneal trunk   Peroneal Artery Stent  05/15/13   Diamondback rot. atherectomy of high grade segmental popliteal stenosis then Chocolate balloonPTA; then angiosculpt PTA of the prox peroneal with stent-Xpedition    pv angio  07/20/13   high-grade calcified popliteal  disease with one-vessel runoff via a small anterior tibial artery that has proximal disease as well, no intervention   TOTAL HIP ARTHROPLASTY Right 10/12/2018   Procedure: RIGHT TOTAL HIP ARTHROPLASTY ANTERIOR APPROACH;  Surgeon: Kathryne Hitch, MD;  Location: [facility] ORS;  Service: Orthopedics;  Laterality: Right;   TOTAL HIP ARTHROPLASTY Left 11/21/2020   Procedure: LEFT TOTAL HIP ARTHROPLASTY ANTERIOR APPROACH;  Surgeon: Kathryne Hitch, MD;  Location: [facility] ORS;  Service: Orthopedics;  Laterality: Left;   TRANSTHORACIC ECHOCARDIOGRAM  11/'17; 10/19   a) EF 55-60%. Gr 1 DD. Normal PAP. Aortic Sclerosis.;; b) Normal LV size and function with vigorous EF 65 to 70%.  GR 1 DD.  Severe LA dilation (suggestive of probably worsening: GR 1 DD).   TRANSTHORACIC ECHOCARDIOGRAM  03/18/2020   EF 70-75%.  Hyperdynamic.  GR 1 DD.  No R WMA.  Mild LA dilation.  Mild aortic valve sclerosis but no stenosis.-No change from prior echo   TUBAL LIGATION     Social History   Occupational History   Occupation: retired  Tobacco Use   Smoking status: Never   Smokeless tobacco: Never  Vaping Use   Vaping status: Never Used  Substance and Sexual Activity   Alcohol use: Not Currently   Drug use: No   Sexual activity: Not on file

## 2023-09-12 ENCOUNTER — Ambulatory Visit: Payer: Medicare Other | Admitting: Orthopedic Surgery

## 2023-09-12 DIAGNOSIS — M79604 Pain in right leg: Secondary | ICD-10-CM | POA: Diagnosis not present

## 2023-09-12 DIAGNOSIS — I87331 Chronic venous hypertension (idiopathic) with ulcer and inflammation of right lower extremity: Secondary | ICD-10-CM | POA: Diagnosis not present

## 2023-09-13 ENCOUNTER — Encounter: Payer: Self-pay | Admitting: Podiatry

## 2023-09-13 ENCOUNTER — Ambulatory Visit: Payer: Medicare Other | Admitting: Podiatry

## 2023-09-13 DIAGNOSIS — M79675 Pain in left toe(s): Secondary | ICD-10-CM | POA: Diagnosis not present

## 2023-09-13 DIAGNOSIS — L608 Other nail disorders: Secondary | ICD-10-CM

## 2023-09-13 DIAGNOSIS — E1151 Type 2 diabetes mellitus with diabetic peripheral angiopathy without gangrene: Secondary | ICD-10-CM

## 2023-09-13 DIAGNOSIS — B351 Tinea unguium: Secondary | ICD-10-CM | POA: Diagnosis not present

## 2023-09-13 DIAGNOSIS — I739 Peripheral vascular disease, unspecified: Secondary | ICD-10-CM

## 2023-09-13 DIAGNOSIS — M79674 Pain in right toe(s): Secondary | ICD-10-CM

## 2023-09-13 NOTE — Progress Notes (Signed)
This patient returns to my office for at risk foot care.  This patient requires this care by a professional since this patient will be at risk due to having diabetes,  PAD  and coagulation defect.  Patient is taking plavix. Patient says she has painful long thick nails especially her big toenail right foot.   This patient is unable to cut nails herself since the patient cannot reach her nails.These nails are painful walking and wearing shoes.  This patient presents for at risk foot care today. Patient is wearing bandage lower right leg.  General Appearance  Alert, conversant and in no acute stress.  Vascular  Dorsalis pedis and posterior tibial  pulses are not  palpable  bilaterally. Absent digital hair  B/L.  Capillary return is within normal limits  bilaterally. Temperature is within normal limits  bilaterally. Venous stasis  B/l.  Neurologic  Senn-Weinstein monofilament wire test within normal limits  bilaterally. Muscle power within normal limits bilaterally.  Nails Thick disfigured discolored nails with subungual debris  from hallux to fifth toes bilaterally. No evidence of bacterial infection or drainage bilaterally.  Right pincer nail hallux.  Orthopedic  No limitations of motion  feet .  No crepitus or effusions noted.  No bony pathology or digital deformities noted.  Skin  normotropic skin with no porokeratosis noted bilaterally.  No signs of infections or ulcers noted.     Onychomycosis  Pain in right toes  Pain in left toes    Consent was obtained for treatment procedures.   Mechanical debridement of nails 1-5  bilaterally performed with a nail nipper.  Filed with dremel without incident.     Return office visit    3 months                 Told patient to return for periodic foot care and evaluation due to potential at risk complications.   Helane Gunther DPM

## 2023-09-19 ENCOUNTER — Ambulatory Visit: Payer: Medicare Other | Admitting: Orthopedic Surgery

## 2023-09-19 DIAGNOSIS — I87331 Chronic venous hypertension (idiopathic) with ulcer and inflammation of right lower extremity: Secondary | ICD-10-CM | POA: Diagnosis not present

## 2023-09-20 ENCOUNTER — Encounter: Payer: Self-pay | Admitting: Orthopedic Surgery

## 2023-09-20 NOTE — Progress Notes (Signed)
Office Visit Note   Patient: [name]           Date of Birth: 01-Jun-1933           MRN: [id number] Visit Date: 09/12/2023              Requested by: Irena Reichmann, DO [address] [place],  [place] [address] PCP: Irena Reichmann, DO  Chief Complaint  Patient presents with   Right Leg - Follow-up      HPI: Patient is an 89 year old woman who presents in follow-up for venous insufficiency ulcer right lower extremity.  Assessment & Plan: Visit Diagnoses:  1. Pain in right leg   2. Blister of lower leg, right, sequela     Plan: Will apply Adaptic touch plus a Dynaflex wrap.  Follow-Up Instructions: Return in about 1 week (around 09/19/2023).   Ortho Exam  Patient is alert, oriented, no adenopathy, well-dressed, normal affect, normal respiratory effort. Examination patient has a Flores dorsalis pedis pulse.  The venous stasis ulcer measures 4 x 5 cm with 50% healthy granulation tissue.  Imaging: No results found. No images are attached to the encounter.  Labs: Lab Results  Component Value Date   HGBA1C 8.6 (H) 05/04/2023   HGBA1C 8.4 (H) 10/20/2022   HGBA1C 7.3 (H) 11/17/2020   CRP 0.9 03/19/2020   REPTSTATUS 11/20/2020 FINAL 11/17/2020   CULT (A) 11/17/2020    20,000 COLONIES/mL KLEBSIELLA PNEUMONIAE 30,000 COLONIES/mL ESCHERICHIA COLI    LABORGA KLEBSIELLA PNEUMONIAE (A) 11/17/2020   LABORGA ESCHERICHIA COLI (A) 11/17/2020     Lab Results  Component Value Date   ALBUMIN 2.9 (L) 05/05/2023   ALBUMIN 3.6 05/04/2023   ALBUMIN 3.4 (L) 05/04/2023   PREALBUMIN 17 (L) 05/05/2023    Lab Results  Component Value Date   MG 2.1 05/05/2023   MG 2.2 05/04/2023   MG 2.2 11/17/2020   No results found for: "VD25OH"  Lab Results  Component Value Date   PREALBUMIN 17 (L) 05/05/2023      Latest Ref Rng & Units 05/05/2023    4:52 AM 05/04/2023    2:33 PM 10/21/2022    4:20 AM  CBC EXTENDED  WBC 4.0 - 10.5 K/uL 5.9  6.0  5.8   RBC 3.87 - 5.11  MIL/uL 3.76  4.18  4.05   Hemoglobin 12.0 - 15.0 g/dL 84.1  32.4  40.1   HCT 36.0 - 46.0 % 34.1  38.3  36.5   Platelets 150 - 400 K/uL 258  315  208   NEUT# 1.7 - 7.7 K/uL  3.9    Lymph# 0.7 - 4.0 K/uL  1.3       There is no height or weight on file to calculate BMI.  Orders:  No orders of the defined types were placed in this encounter.  No orders of the defined types were placed in this encounter.    Procedures: No procedures performed  Clinical Data: No additional findings.  ROS:  All other systems negative, except as noted in the HPI. Review of Systems  Objective: Vital Signs: There were no vitals taken for this visit.  Specialty Comments:  No specialty comments available.  PMFS History: Patient Active Problem List   Diagnosis Date Noted   Pincer nail deformity 06/13/2023   Orthostatic hypotension 05/05/2023   Leg wound, right 05/04/2023   Pain in right hip 11/29/2022   Right arm pain 11/29/2022   Hypertensive urgency 10/20/2022   Nausea and vomiting  10/20/2022   Anxiety 11/04/2021   Avascular necrosis (HCC) 11/04/2021   Chronic interstitial cystitis 11/04/2021   Diabetic peripheral neuropathy associated with type 2 diabetes mellitus (HCC) 11/04/2021   Polyneuropathy due to type 2 diabetes mellitus (HCC) 11/04/2021   Eczema 11/04/2021   History of urinary tract infection 11/04/2021   Neuropathy 11/04/2021   Other long term (current) drug therapy 11/04/2021   Peptic ulcer disease 11/04/2021   Presence of aortocoronary bypass graft 11/04/2021   Skin sensation disturbance 11/04/2021   Vitamin D deficiency 11/04/2021   Gastroesophageal reflux disease 11/04/2021   Rheumatoid arthritis (HCC) 11/04/2021   Peripheral vascular disease (HCC) 11/04/2021   Type 2 diabetes mellitus with other specified complication (HCC) 11/04/2021   Chronic diastolic CHF (congestive heart failure) (HCC) 01/07/2021   Status post total hip replacement, right 12/04/2020   Status  post total replacement of left hip 11/21/2020   Near syncope 07/10/2020   Hx of CABG 07/10/2020   Preoperative cardiovascular examination 04/15/2020   Chest pain 03/18/2020   Unilateral primary osteoarthritis, left hip 04/03/2019   History of right hip replacement 04/03/2019   Status post total replacement of right hip 10/12/2018   Unilateral primary osteoarthritis, right hip 09/12/2018   Degenerative joint disease 05/30/2018   Anal fissure 11/10/2017   Rectal bleeding 11/10/2017   Rectal pain 11/10/2017   Inflammatory arthritis 10/13/2017   Latent tuberculosis by blood test 10/13/2017   Chronic diastolic CHF (congestive heart failure), NYHA class 2 (HCC) 08/11/2017   Fatigue due to treatment 01/09/2017   NSTEMI (non-ST elevated myocardial infarction) (HCC) 11/01/2016   Diabetes mellitus (HCC) 10/24/2015   Non-insulin dependent type 2 diabetes mellitus (HCC) 10/24/2015   Angina, class I (HCC) 04/22/2015   DOE (dyspnea on exertion) 04/22/2015   Bilateral leg edema 04/22/2015   Accumulation of fluid in tissues 03/21/2014   Vertigo, central 01/02/2014   Vertigo 01/02/2014   Limb pain- echymosis, pain Lt radial artery after cath 12/11/2013   Dyslipidemia, goal LDL below 70 05/17/2013   Essential hypertension 05/17/2013   Claudication (HCC) 05/16/2013   PTA/ Stent Rt popliteal and Rt peroneal artery 05/16/13 05/16/2013   PAD,severe calcified pop and tibial peroneal trunk disease bilateraly    ABDOMINAL PAIN RIGHT LOWER QUADRANT 10/07/2010   CVA-STROKE 03/04/2010   BARRETT'S ESOPHAGUS 03/04/2010   FECAL INCONTINENCE 03/04/2010   DM 01/14/2010   Anemia of chronic disease 01/14/2010   CAD (coronary artery disease) 01/14/2010   DIVERTICULOSIS OF COLON 01/14/2010   History of colonic polyps 01/14/2010   SHELLFISH ALLERGY 01/14/2010   Past Medical History:  Diagnosis Date   Allergy to IVP dye    Angina, class I (HCC) 04/22/2015   Atherosclerotic heart disease native coronary artery  w/angina pectoris (HCC) 1992   a. 1992 s/p PTCA of LAD;  b. 1998 CABG x 3; c. 07/2001 Cath: Sev LM/LAD/LCX dzs, 3/3 patent grafts; d. 11/2013 Cath: stable graft anatomy; e. 10/2016 Cath: native 3VD, VG->OM nl, LIMA->LAD nl, VG->Diag 55.   Atherosclerotic heart disease of native coronary artery with angina pectoris (HCC) 01/14/2010   Qualifier: Diagnosis of  By: Arlyce Dice MD, Barbette Hair    Atrial septal aneurysm    Barrett's esophagus 03/04/2010   Qualifier: Diagnosis of  By: Myrtie Hawk, Amy S    Bilateral leg edema 04/22/2015   Bradycardia, mild briefly to the 40s, asymptomatic 05/16/2013   Carotid arterial disease (HCC)    a. 05/2014 Carotid U/S: No signif bilat ICA stenosis, >60% L ECA.  Chronic anemia    Chronic diastolic CHF (congestive heart failure) (HCC)    a. 10/2016 Echo: Ef 55-60%, no rwma, Gr1 DD, triv AI, PASP , atrial septal aneurysm.   Chronic diastolic CHF (congestive heart failure), NYHA class 2 (HCC) 08/11/2017   Claudication (HCC) 05/16/2013   Peripheral arterial occlusive disease with lifestyle limiting claudication    Degenerative joint disease 05/30/2018   DIVERTICULOSIS OF COLON 01/14/2010   Qualifier: Diagnosis of  By: Arlyce Dice MD, Barbette Hair    DM (diabetes mellitus), type 2 with peripheral vascular complications (HCC)    On Oral medications.   DOE (dyspnea on exertion) 04/22/2015   Essential hypertension    FECAL INCONTINENCE 03/04/2010   Qualifier: Diagnosis of  By: Monica Becton PA-c, Amy S    GERD (gastroesophageal reflux disease)    Hyperlipidemia with target LDL less than 70    Inflammatory arthritis 10/13/2017   Neuropathy    Non-insulin dependent type 2 diabetes mellitus (HCC) 10/24/2015   NSTEMI (non-ST elevated myocardial infarction) (HCC) 11/01/2016   PAD (peripheral artery disease) (HCC) 05/2013; 09/2013   a. severe calcified POP-1 & TP Trunk Dz bilat;  b . 05/2013 Diamondback Rot Athrectomy (R POP) --> PTA R Pop, DES to R Peroneal;  c. 09/2013  PTA/Stenting of L Pop Clemencia Course - retrograde access);  d. 04/2014 ABI's: R/L: 1.1/1.1.   Preoperative cardiovascular examination 04/15/2020   PUD (peptic ulcer disease)    Rheumatoid arthritis (HCC)    S/P CABG x 3 1998   LIMA-LAD, SVG-OM, SVG-D1   Severe claudication (HCC) 05/2013   Referred for Peripheral Angio (Dr. Allyson Sabal --> Dr. Hoy Finlay in [place])   [facility] ALLERGY 01/14/2010   Qualifier: Diagnosis of  By: Candice Camp CMA (AAMA), [name]     Status post total replacement of right hip 10/12/2018   Vertigo 01/02/2014    Family History  Adopted: Yes  Problem Relation Age of Onset   Hypertension Mother    Hyperlipidemia Mother    Hyperlipidemia Father    Hypertension Father     Past Surgical History:  Procedure Laterality Date   ATHERECTOMY Right 05/15/2013   Procedure: ATHERECTOMY;  Surgeon: Runell Gess, MD;  Location: [facility] CATH LAB;  Service: Cardiovascular;  Laterality: Right;  popliteal   BACK SURGERY     CARDIAC CATHETERIZATION  07/17/01   2 v CAD with LM, circ, LAD, obtuse diag, mod stenosis of diag vein graft , mild stenosis of marg vein graft, nl EF, medical treatment   CARDIAC CATHETERIZATION  11/2013   Widely patent LIMA-LAD & SVG-OM with mild progression of proximal stenosis ~40-50% in SVG-D1; Widely patent native RCA with known severe native LCA disease   CARDIAC CATHETERIZATION N/A 11/01/2016   Procedure: Left Heart Cath and Cors/Grafts Angiography;  Surgeon: [name], MD;  Location: [facility] INVASIVE CV LAB;  Service: Cardiovascular: Hemodynamics: High LVEDP!.  Cors/Grafts: LM 55%, o-p LAD 100% then after D1 -> LIMA-LAD patent, SVG-Diag ~55%. small RI wiht ~70% ostial. O-p Cx 80% - pCx 70% --> SVG-bifurcating OM patent. -- med Rx   CORONARY ANGIOPLASTY  1992   PCI to LAD   CORONARY ARTERY BYPASS GRAFT  1998   LIMA-LAD, SVG-diagonal (none roughly 50% stenosis), SVG-OM   LEFT HEART CATHETERIZATION WITH CORONARY ANGIOGRAM N/A 11/27/2013   Procedure: LEFT HEART  CATHETERIZATION WITH CORONARY ANGIOGRAM;  Surgeon: [name], MD;  Location: [facility] CATH LAB;  Service: Cardiovascular;  Laterality: N/A;   LOW EXTREMITY DOPPLERS/ABI  10/2016   Patent  right SFA, popliteal and peroneal tendons. Patent left SFA and popliteal stent. 30-50% stenosis in the right common femoral and popliteal arteries, 50-74% stenosis in left profunda femoris artery. --> 1 year follow-up.  RABI - 1.2, LABI 1.1   LOWER EXTREMITY ANGIOGRAM N/A 02/22/2013   Procedure: LOWER EXTREMITY ANGIOGRAM;  Surgeon: Runell Gess, MD;  Location: [facility] CATH LAB;  Service: Cardiovascular;  Laterality: N/A;   LOWER EXTREMITY ANGIOGRAM N/A 07/20/2013   Procedure: LOWER EXTREMITY ANGIOGRAM;  Surgeon: Runell Gess, MD;  Location: [facility] CATH LAB;  Service: Cardiovascular;  Laterality: N/A;   NM MYOVIEW LTD  04/03/2013; 5/26'/2016   Lexiscan: a)  Apical perfusion defect with no ischemia. Consider prior infarct versus breast attenuation.;; b) 5/'16: LOW RISK, Nl EF ~55-65%, No Ischemia /Infarction   NM MYOVIEW LTD  10/'19; 4/'21   a) LOW RISK.  EF 55%.  No ischemia or infarction. b) EF estimated 81%.  Suboptimal images.  May be subtle inferior ischemia-initially read by radiology as intermediate risk, however overread by cardiology to be mild low risk.Marland Kitchen   PERCUTANEOUS STENT INTERVENTION Right 05/15/2013   Procedure: PERCUTANEOUS STENT INTERVENTION;  Surgeon: Runell Gess, MD;  Location: [facility] CATH LAB;  Service: Cardiovascular;  Laterality: Right;  tibioperoneal trunk   Peroneal Artery Stent  05/15/13   Diamondback rot. atherectomy of high grade segmental popliteal stenosis then Chocolate balloonPTA; then angiosculpt PTA of the prox peroneal with stent-Xpedition    pv angio  07/20/13   high-grade calcified popliteal disease with one-vessel runoff via a small anterior tibial artery that has proximal disease as well, no intervention   TOTAL HIP ARTHROPLASTY Right 10/12/2018   Procedure: RIGHT TOTAL HIP ARTHROPLASTY  ANTERIOR APPROACH;  Surgeon: Kathryne Hitch, MD;  Location: [facility] ORS;  Service: Orthopedics;  Laterality: Right;   TOTAL HIP ARTHROPLASTY Left 11/21/2020   Procedure: LEFT TOTAL HIP ARTHROPLASTY ANTERIOR APPROACH;  Surgeon: Kathryne Hitch, MD;  Location: [facility] ORS;  Service: Orthopedics;  Laterality: Left;   TRANSTHORACIC ECHOCARDIOGRAM  11/'17; 10/19   a) EF 55-60%. Gr 1 DD. Normal PAP. Aortic Sclerosis.;; b) Normal LV size and function with vigorous EF 65 to 70%.  GR 1 DD.  Severe LA dilation (suggestive of probably worsening: GR 1 DD).   TRANSTHORACIC ECHOCARDIOGRAM  03/18/2020   EF 70-75%.  Hyperdynamic.  GR 1 DD.  No R WMA.  Mild LA dilation.  Mild aortic valve sclerosis but no stenosis.-No change from prior echo   TUBAL LIGATION     Social History   Occupational History   Occupation: retired  Tobacco Use   Smoking status: Never   Smokeless tobacco: Never  Vaping Use   Vaping status: Never Used  Substance and Sexual Activity   Alcohol use: Not Currently   Drug use: No   Sexual activity: Not on file

## 2023-09-20 NOTE — Progress Notes (Signed)
Office Visit Note   Patient: [name]           Date of Birth: 01/13/1933           MRN: [id number] Visit Date: 09/19/2023              Requested by: Irena Reichmann, DO [address] [place],  [place] [address] PCP: Irena Reichmann, DO  Chief Complaint  Patient presents with   Right Leg - Follow-up      HPI: Patient is a 89 year old woman who presents in follow-up for venous ulcer anterior right leg.  Patient has been in a Profore compression wrap.  Assessment & Plan: Visit Diagnoses:  1. Chronic venous hypertension (idiopathic) with ulcer and inflammation of right lower extremity (HCC)     Plan: Examination the wound measures 3 x 5 centimeters with healthy granulation tissue this is much improved there is superficial epithelialization.  There is no cellulitis or drainage.  Follow-Up Instructions: Return in about 1 week (around 09/26/2023).   Ortho Exam  Patient is alert, oriented, no adenopathy, well-dressed, normal affect, normal respiratory effort. Will reapply a protective layer and a Dynaflex compression wrap.  Imaging: No results found. No images are attached to the encounter.  Labs: Lab Results  Component Value Date   HGBA1C 8.6 (H) 05/04/2023   HGBA1C 8.4 (H) 10/20/2022   HGBA1C 7.3 (H) 11/17/2020   CRP 0.9 03/19/2020   REPTSTATUS 11/20/2020 FINAL 11/17/2020   CULT (A) 11/17/2020    20,000 COLONIES/mL KLEBSIELLA PNEUMONIAE 30,000 COLONIES/mL ESCHERICHIA COLI    LABORGA KLEBSIELLA PNEUMONIAE (A) 11/17/2020   LABORGA ESCHERICHIA COLI (A) 11/17/2020     Lab Results  Component Value Date   ALBUMIN 2.9 (L) 05/05/2023   ALBUMIN 3.6 05/04/2023   ALBUMIN 3.4 (L) 05/04/2023   PREALBUMIN 17 (L) 05/05/2023    Lab Results  Component Value Date   MG 2.1 05/05/2023   MG 2.2 05/04/2023   MG 2.2 11/17/2020   No results found for: "VD25OH"  Lab Results  Component Value Date   PREALBUMIN 17 (L) 05/05/2023      Latest Ref Rng & Units  05/05/2023    4:52 AM 05/04/2023    2:33 PM 10/21/2022    4:20 AM  CBC EXTENDED  WBC 4.0 - 10.5 K/uL 5.9  6.0  5.8   RBC 3.87 - 5.11 MIL/uL 3.76  4.18  4.05   Hemoglobin 12.0 - 15.0 g/dL 57.8  46.9  62.9   HCT 36.0 - 46.0 % 34.1  38.3  36.5   Platelets 150 - 400 K/uL 258  315  208   NEUT# 1.7 - 7.7 K/uL  3.9    Lymph# 0.7 - 4.0 K/uL  1.3       There is no height or weight on file to calculate BMI.  Orders:  No orders of the defined types were placed in this encounter.  No orders of the defined types were placed in this encounter.    Procedures: No procedures performed  Clinical Data: No additional findings.  ROS:  All other systems negative, except as noted in the HPI. Review of Systems  Objective: Vital Signs: There were no vitals taken for this visit.  Specialty Comments:  No specialty comments available.  PMFS History: Patient Active Problem List   Diagnosis Date Noted   Pincer nail deformity 06/13/2023   Orthostatic hypotension 05/05/2023   Leg wound, right 05/04/2023   Pain in right hip 11/29/2022  Right arm pain 11/29/2022   Hypertensive urgency 10/20/2022   Nausea and vomiting 10/20/2022   Anxiety 11/04/2021   Avascular necrosis (HCC) 11/04/2021   Chronic interstitial cystitis 11/04/2021   Diabetic peripheral neuropathy associated with type 2 diabetes mellitus (HCC) 11/04/2021   Polyneuropathy due to type 2 diabetes mellitus (HCC) 11/04/2021   Eczema 11/04/2021   History of urinary tract infection 11/04/2021   Neuropathy 11/04/2021   Other long term (current) drug therapy 11/04/2021   Peptic ulcer disease 11/04/2021   Presence of aortocoronary bypass graft 11/04/2021   Skin sensation disturbance 11/04/2021   Vitamin D deficiency 11/04/2021   Gastroesophageal reflux disease 11/04/2021   Rheumatoid arthritis (HCC) 11/04/2021   Peripheral vascular disease (HCC) 11/04/2021   Type 2 diabetes mellitus with other specified complication (HCC) 11/04/2021    Chronic diastolic CHF (congestive heart failure) (HCC) 01/07/2021   Status post total hip replacement, right 12/04/2020   Status post total replacement of left hip 11/21/2020   Near syncope 07/10/2020   Hx of CABG 07/10/2020   Preoperative cardiovascular examination 04/15/2020   Chest pain 03/18/2020   Unilateral primary osteoarthritis, left hip 04/03/2019   History of right hip replacement 04/03/2019   Status post total replacement of right hip 10/12/2018   Unilateral primary osteoarthritis, right hip 09/12/2018   Degenerative joint disease 05/30/2018   Anal fissure 11/10/2017   Rectal bleeding 11/10/2017   Rectal pain 11/10/2017   Inflammatory arthritis 10/13/2017   Latent tuberculosis by blood test 10/13/2017   Chronic diastolic CHF (congestive heart failure), NYHA class 2 (HCC) 08/11/2017   Fatigue due to treatment 01/09/2017   NSTEMI (non-ST elevated myocardial infarction) (HCC) 11/01/2016   Diabetes mellitus (HCC) 10/24/2015   Non-insulin dependent type 2 diabetes mellitus (HCC) 10/24/2015   Angina, class I (HCC) 04/22/2015   DOE (dyspnea on exertion) 04/22/2015   Bilateral leg edema 04/22/2015   Accumulation of fluid in tissues 03/21/2014   Vertigo, central 01/02/2014   Vertigo 01/02/2014   Limb pain- echymosis, pain Lt radial artery after cath 12/11/2013   Dyslipidemia, goal LDL below 70 05/17/2013   Essential hypertension 05/17/2013   Claudication (HCC) 05/16/2013   PTA/ Stent Rt popliteal and Rt peroneal artery 05/16/13 05/16/2013   PAD,severe calcified pop and tibial peroneal trunk disease bilateraly    ABDOMINAL PAIN RIGHT LOWER QUADRANT 10/07/2010   CVA-STROKE 03/04/2010   BARRETT'S ESOPHAGUS 03/04/2010   FECAL INCONTINENCE 03/04/2010   DM 01/14/2010   Anemia of chronic disease 01/14/2010   CAD (coronary artery disease) 01/14/2010   DIVERTICULOSIS OF COLON 01/14/2010   History of colonic polyps 01/14/2010   SHELLFISH ALLERGY 01/14/2010   Past Medical  History:  Diagnosis Date   Allergy to IVP dye    Angina, class I (HCC) 04/22/2015   Atherosclerotic heart disease native coronary artery w/angina pectoris (HCC) 1992   a. 1992 s/p PTCA of LAD;  b. 1998 CABG x 3; c. 07/2001 Cath: Sev LM/LAD/LCX dzs, 3/3 patent grafts; d. 11/2013 Cath: stable graft anatomy; e. 10/2016 Cath: native 3VD, VG->OM nl, LIMA->LAD nl, VG->Diag 55.   Atherosclerotic heart disease of native coronary artery with angina pectoris (HCC) 01/14/2010   Qualifier: Diagnosis of  By: Arlyce Dice MD, Barbette Hair    Atrial septal aneurysm    Barrett's esophagus 03/04/2010   Qualifier: Diagnosis of  By: Myrtie Hawk, Amy S    Bilateral leg edema 04/22/2015   Bradycardia, mild briefly to the 40s, asymptomatic 05/16/2013   Carotid arterial disease (HCC)  a. 05/2014 Carotid U/S: No signif bilat ICA stenosis, >60% L ECA.   Chronic anemia    Chronic diastolic CHF (congestive heart failure) (HCC)    a. 10/2016 Echo: Ef 55-60%, no rwma, Gr1 DD, triv AI, PASP , atrial septal aneurysm.   Chronic diastolic CHF (congestive heart failure), NYHA class 2 (HCC) 08/11/2017   Claudication (HCC) 05/16/2013   Peripheral arterial occlusive disease with lifestyle limiting claudication    Degenerative joint disease 05/30/2018   DIVERTICULOSIS OF COLON 01/14/2010   Qualifier: Diagnosis of  By: Arlyce Dice MD, Barbette Hair    DM (diabetes mellitus), type 2 with peripheral vascular complications (HCC)    On Oral medications.   DOE (dyspnea on exertion) 04/22/2015   Essential hypertension    FECAL INCONTINENCE 03/04/2010   Qualifier: Diagnosis of  By: Monica Becton PA-c, Amy S    GERD (gastroesophageal reflux disease)    Hyperlipidemia with target LDL less than 70    Inflammatory arthritis 10/13/2017   Neuropathy    Non-insulin dependent type 2 diabetes mellitus (HCC) 10/24/2015   NSTEMI (non-ST elevated myocardial infarction) (HCC) 11/01/2016   PAD (peripheral artery disease) (HCC) 05/2013; 09/2013   a.  severe calcified POP-1 & TP Trunk Dz bilat;  b . 05/2013 Diamondback Rot Athrectomy (R POP) --> PTA R Pop, DES to R Peroneal;  c. 09/2013 PTA/Stenting of L Pop Clemencia Course - retrograde access);  d. 04/2014 ABI's: R/L: 1.1/1.1.   Preoperative cardiovascular examination 04/15/2020   PUD (peptic ulcer disease)    Rheumatoid arthritis (HCC)    S/P CABG x 3 1998   LIMA-LAD, SVG-OM, SVG-D1   Severe claudication (HCC) 05/2013   Referred for Peripheral Angio (Dr. Allyson Sabal --> Dr. Hoy Finlay in [place])   [facility] ALLERGY 01/14/2010   Qualifier: Diagnosis of  By: Candice Camp CMA (AAMA), [name]     Status post total replacement of right hip 10/12/2018   Vertigo 01/02/2014    Family History  Adopted: Yes  Problem Relation Age of Onset   Hypertension Mother    Hyperlipidemia Mother    Hyperlipidemia Father    Hypertension Father     Past Surgical History:  Procedure Laterality Date   ATHERECTOMY Right 05/15/2013   Procedure: ATHERECTOMY;  Surgeon: Runell Gess, MD;  Location: [facility] CATH LAB;  Service: Cardiovascular;  Laterality: Right;  popliteal   BACK SURGERY     CARDIAC CATHETERIZATION  07/17/01   2 v CAD with LM, circ, LAD, obtuse diag, mod stenosis of diag vein graft , mild stenosis of marg vein graft, nl EF, medical treatment   CARDIAC CATHETERIZATION  11/2013   Widely patent LIMA-LAD & SVG-OM with mild progression of proximal stenosis ~40-50% in SVG-D1; Widely patent native RCA with known severe native LCA disease   CARDIAC CATHETERIZATION N/A 11/01/2016   Procedure: Left Heart Cath and Cors/Grafts Angiography;  Surgeon: [name], MD;  Location: [facility] INVASIVE CV LAB;  Service: Cardiovascular: Hemodynamics: High LVEDP!.  Cors/Grafts: LM 55%, o-p LAD 100% then after D1 -> LIMA-LAD patent, SVG-Diag ~55%. small RI wiht ~70% ostial. O-p Cx 80% - pCx 70% --> SVG-bifurcating OM patent. -- med Rx   CORONARY ANGIOPLASTY  1992   PCI to LAD   CORONARY ARTERY BYPASS GRAFT  1998   LIMA-LAD,  SVG-diagonal (none roughly 50% stenosis), SVG-OM   LEFT HEART CATHETERIZATION WITH CORONARY ANGIOGRAM N/A 11/27/2013   Procedure: LEFT HEART CATHETERIZATION WITH CORONARY ANGIOGRAM;  Surgeon: [name], MD;  Location: [facility] CATH LAB;  Service:  Cardiovascular;  Laterality: N/A;   LOW EXTREMITY DOPPLERS/ABI  10/2016   Patent right SFA, popliteal and peroneal tendons. Patent left SFA and popliteal stent. 30-50% stenosis in the right common femoral and popliteal arteries, 50-74% stenosis in left profunda femoris artery. --> 1 year follow-up.  RABI - 1.2, LABI 1.1   LOWER EXTREMITY ANGIOGRAM N/A 02/22/2013   Procedure: LOWER EXTREMITY ANGIOGRAM;  Surgeon: Runell Gess, MD;  Location: [facility] CATH LAB;  Service: Cardiovascular;  Laterality: N/A;   LOWER EXTREMITY ANGIOGRAM N/A 07/20/2013   Procedure: LOWER EXTREMITY ANGIOGRAM;  Surgeon: Runell Gess, MD;  Location: [facility] CATH LAB;  Service: Cardiovascular;  Laterality: N/A;   NM MYOVIEW LTD  04/03/2013; 5/26'/2016   Lexiscan: a)  Apical perfusion defect with no ischemia. Consider prior infarct versus breast attenuation.;; b) 5/'16: LOW RISK, Nl EF ~55-65%, No Ischemia /Infarction   NM MYOVIEW LTD  10/'19; 4/'21   a) LOW RISK.  EF 55%.  No ischemia or infarction. b) EF estimated 81%.  Suboptimal images.  May be subtle inferior ischemia-initially read by radiology as intermediate risk, however overread by cardiology to be mild low risk.Marland Kitchen   PERCUTANEOUS STENT INTERVENTION Right 05/15/2013   Procedure: PERCUTANEOUS STENT INTERVENTION;  Surgeon: Runell Gess, MD;  Location: [facility] CATH LAB;  Service: Cardiovascular;  Laterality: Right;  tibioperoneal trunk   Peroneal Artery Stent  05/15/13   Diamondback rot. atherectomy of high grade segmental popliteal stenosis then Chocolate balloonPTA; then angiosculpt PTA of the prox peroneal with stent-Xpedition    pv angio  07/20/13   high-grade calcified popliteal disease with one-vessel runoff via a small anterior tibial  artery that has proximal disease as well, no intervention   TOTAL HIP ARTHROPLASTY Right 10/12/2018   Procedure: RIGHT TOTAL HIP ARTHROPLASTY ANTERIOR APPROACH;  Surgeon: Kathryne Hitch, MD;  Location: [facility] ORS;  Service: Orthopedics;  Laterality: Right;   TOTAL HIP ARTHROPLASTY Left 11/21/2020   Procedure: LEFT TOTAL HIP ARTHROPLASTY ANTERIOR APPROACH;  Surgeon: Kathryne Hitch, MD;  Location: [facility] ORS;  Service: Orthopedics;  Laterality: Left;   TRANSTHORACIC ECHOCARDIOGRAM  11/'17; 10/19   a) EF 55-60%. Gr 1 DD. Normal PAP. Aortic Sclerosis.;; b) Normal LV size and function with vigorous EF 65 to 70%.  GR 1 DD.  Severe LA dilation (suggestive of probably worsening: GR 1 DD).   TRANSTHORACIC ECHOCARDIOGRAM  03/18/2020   EF 70-75%.  Hyperdynamic.  GR 1 DD.  No R WMA.  Mild LA dilation.  Mild aortic valve sclerosis but no stenosis.-No change from prior echo   TUBAL LIGATION     Social History   Occupational History   Occupation: retired  Tobacco Use   Smoking status: Never   Smokeless tobacco: Never  Vaping Use   Vaping status: Never Used  Substance and Sexual Activity   Alcohol use: Not Currently   Drug use: No   Sexual activity: Not on file

## 2023-09-26 ENCOUNTER — Ambulatory Visit: Payer: Medicare Other | Admitting: Orthopedic Surgery

## 2023-09-26 DIAGNOSIS — I87331 Chronic venous hypertension (idiopathic) with ulcer and inflammation of right lower extremity: Secondary | ICD-10-CM

## 2023-09-27 ENCOUNTER — Encounter: Payer: Self-pay | Admitting: Orthopedic Surgery

## 2023-09-27 NOTE — Progress Notes (Signed)
Office Visit Note   Patient: [name]           Date of Birth: 01-Feb-1933           MRN: [id number] Visit Date: 09/26/2023              Requested by: Irena Reichmann, DO [address] [place],  [place] [address] PCP: Irena Reichmann, DO  Chief Complaint  Patient presents with   Right Leg - Follow-up      HPI: Patient is an 89 year old woman who is seen in follow-up for venous insufficiency ulceration right lower extremity.  Assessment & Plan: Visit Diagnoses:  1. Chronic venous hypertension (idiopathic) with ulcer and inflammation of right lower extremity (HCC)     Plan: The ulcer is well-healed she was placed in an extra-large compression sleeve this should be easier to don for than a compression sock.  Follow-Up Instructions: No follow-ups on file.   Ortho Exam  Patient is alert, oriented, no adenopathy, well-dressed, normal affect, normal respiratory effort. Examination the venous stasis insufficiency ulcer is well-healed.  Patient does have brawny skin color changes there is no cellulitis no drainage.  Imaging: No results found. No images are attached to the encounter.  Labs: Lab Results  Component Value Date   HGBA1C 8.6 (H) 05/04/2023   HGBA1C 8.4 (H) 10/20/2022   HGBA1C 7.3 (H) 11/17/2020   CRP 0.9 03/19/2020   REPTSTATUS 11/20/2020 FINAL 11/17/2020   CULT (A) 11/17/2020    20,000 COLONIES/mL KLEBSIELLA PNEUMONIAE 30,000 COLONIES/mL ESCHERICHIA COLI    LABORGA KLEBSIELLA PNEUMONIAE (A) 11/17/2020   LABORGA ESCHERICHIA COLI (A) 11/17/2020     Lab Results  Component Value Date   ALBUMIN 2.9 (L) 05/05/2023   ALBUMIN 3.6 05/04/2023   ALBUMIN 3.4 (L) 05/04/2023   PREALBUMIN 17 (L) 05/05/2023    Lab Results  Component Value Date   MG 2.1 05/05/2023   MG 2.2 05/04/2023   MG 2.2 11/17/2020   No results found for: "VD25OH"  Lab Results  Component Value Date   PREALBUMIN 17 (L) 05/05/2023      Latest Ref Rng & Units 05/05/2023     4:52 AM 05/04/2023    2:33 PM 10/21/2022    4:20 AM  CBC EXTENDED  WBC 4.0 - 10.5 K/uL 5.9  6.0  5.8   RBC 3.87 - 5.11 MIL/uL 3.76  4.18  4.05   Hemoglobin 12.0 - 15.0 g/dL 93.2  35.5  73.2   HCT 36.0 - 46.0 % 34.1  38.3  36.5   Platelets 150 - 400 K/uL 258  315  208   NEUT# 1.7 - 7.7 K/uL  3.9    Lymph# 0.7 - 4.0 K/uL  1.3       There is no height or weight on file to calculate BMI.  Orders:  No orders of the defined types were placed in this encounter.  No orders of the defined types were placed in this encounter.    Procedures: No procedures performed  Clinical Data: No additional findings.  ROS:  All other systems negative, except as noted in the HPI. Review of Systems  Objective: Vital Signs: There were no vitals taken for this visit.  Specialty Comments:  No specialty comments available.  PMFS History: Patient Active Problem List   Diagnosis Date Noted   Pincer nail deformity 06/13/2023   Orthostatic hypotension 05/05/2023   Leg wound, right 05/04/2023   Pain in right hip 11/29/2022   Right arm  pain 11/29/2022   Hypertensive urgency 10/20/2022   Nausea and vomiting 10/20/2022   Anxiety 11/04/2021   Avascular necrosis (HCC) 11/04/2021   Chronic interstitial cystitis 11/04/2021   Diabetic peripheral neuropathy associated with type 2 diabetes mellitus (HCC) 11/04/2021   Polyneuropathy due to type 2 diabetes mellitus (HCC) 11/04/2021   Eczema 11/04/2021   History of urinary tract infection 11/04/2021   Neuropathy 11/04/2021   Other long term (current) drug therapy 11/04/2021   Peptic ulcer disease 11/04/2021   Presence of aortocoronary bypass graft 11/04/2021   Skin sensation disturbance 11/04/2021   Vitamin D deficiency 11/04/2021   Gastroesophageal reflux disease 11/04/2021   Rheumatoid arthritis (HCC) 11/04/2021   Peripheral vascular disease (HCC) 11/04/2021   Type 2 diabetes mellitus with other specified complication (HCC) 11/04/2021   Chronic  diastolic CHF (congestive heart failure) (HCC) 01/07/2021   Status post total hip replacement, right 12/04/2020   Status post total replacement of left hip 11/21/2020   Near syncope 07/10/2020   Hx of CABG 07/10/2020   Preoperative cardiovascular examination 04/15/2020   Chest pain 03/18/2020   Unilateral primary osteoarthritis, left hip 04/03/2019   History of right hip replacement 04/03/2019   Status post total replacement of right hip 10/12/2018   Unilateral primary osteoarthritis, right hip 09/12/2018   Degenerative joint disease 05/30/2018   Anal fissure 11/10/2017   Rectal bleeding 11/10/2017   Rectal pain 11/10/2017   Inflammatory arthritis 10/13/2017   Latent tuberculosis by blood test 10/13/2017   Chronic diastolic CHF (congestive heart failure), NYHA class 2 (HCC) 08/11/2017   Fatigue due to treatment 01/09/2017   NSTEMI (non-ST elevated myocardial infarction) (HCC) 11/01/2016   Diabetes mellitus (HCC) 10/24/2015   Non-insulin dependent type 2 diabetes mellitus (HCC) 10/24/2015   Angina, class I (HCC) 04/22/2015   DOE (dyspnea on exertion) 04/22/2015   Bilateral leg edema 04/22/2015   Accumulation of fluid in tissues 03/21/2014   Vertigo, central 01/02/2014   Vertigo 01/02/2014   Limb pain- echymosis, pain Lt radial artery after cath 12/11/2013   Dyslipidemia, goal LDL below 70 05/17/2013   Essential hypertension 05/17/2013   Claudication (HCC) 05/16/2013   PTA/ Stent Rt popliteal and Rt peroneal artery 05/16/13 05/16/2013   PAD,severe calcified pop and tibial peroneal trunk disease bilateraly    ABDOMINAL PAIN RIGHT LOWER QUADRANT 10/07/2010   CVA-STROKE 03/04/2010   BARRETT'S ESOPHAGUS 03/04/2010   FECAL INCONTINENCE 03/04/2010   DM 01/14/2010   Anemia of chronic disease 01/14/2010   CAD (coronary artery disease) 01/14/2010   DIVERTICULOSIS OF COLON 01/14/2010   History of colonic polyps 01/14/2010   SHELLFISH ALLERGY 01/14/2010   Past Medical History:   Diagnosis Date   Allergy to IVP dye    Angina, class I (HCC) 04/22/2015   Atherosclerotic heart disease native coronary artery w/angina pectoris (HCC) 1992   a. 1992 s/p PTCA of LAD;  b. 1998 CABG x 3; c. 07/2001 Cath: Sev LM/LAD/LCX dzs, 3/3 patent grafts; d. 11/2013 Cath: stable graft anatomy; e. 10/2016 Cath: native 3VD, VG->OM nl, LIMA->LAD nl, VG->Diag 55.   Atherosclerotic heart disease of native coronary artery with angina pectoris (HCC) 01/14/2010   Qualifier: Diagnosis of  By: Arlyce Dice MD, Barbette Hair    Atrial septal aneurysm    Barrett's esophagus 03/04/2010   Qualifier: Diagnosis of  By: Myrtie Hawk, Amy S    Bilateral leg edema 04/22/2015   Bradycardia, mild briefly to the 40s, asymptomatic 05/16/2013   Carotid arterial disease (HCC)    a. 05/2014  Carotid U/S: No signif bilat ICA stenosis, >60% L ECA.   Chronic anemia    Chronic diastolic CHF (congestive heart failure) (HCC)    a. 10/2016 Echo: Ef 55-60%, no rwma, Gr1 DD, triv AI, PASP , atrial septal aneurysm.   Chronic diastolic CHF (congestive heart failure), NYHA class 2 (HCC) 08/11/2017   Claudication (HCC) 05/16/2013   Peripheral arterial occlusive disease with lifestyle limiting claudication    Degenerative joint disease 05/30/2018   DIVERTICULOSIS OF COLON 01/14/2010   Qualifier: Diagnosis of  By: Arlyce Dice MD, Barbette Hair    DM (diabetes mellitus), type 2 with peripheral vascular complications (HCC)    On Oral medications.   DOE (dyspnea on exertion) 04/22/2015   Essential hypertension    FECAL INCONTINENCE 03/04/2010   Qualifier: Diagnosis of  By: Monica Becton PA-c, Amy S    GERD (gastroesophageal reflux disease)    Hyperlipidemia with target LDL less than 70    Inflammatory arthritis 10/13/2017   Neuropathy    Non-insulin dependent type 2 diabetes mellitus (HCC) 10/24/2015   NSTEMI (non-ST elevated myocardial infarction) (HCC) 11/01/2016   PAD (peripheral artery disease) (HCC) 05/2013; 09/2013   a. severe  calcified POP-1 & TP Trunk Dz bilat;  b . 05/2013 Diamondback Rot Athrectomy (R POP) --> PTA R Pop, DES to R Peroneal;  c. 09/2013 PTA/Stenting of L Pop Clemencia Course - retrograde access);  d. 04/2014 ABI's: R/L: 1.1/1.1.   Preoperative cardiovascular examination 04/15/2020   PUD (peptic ulcer disease)    Rheumatoid arthritis (HCC)    S/P CABG x 3 1998   LIMA-LAD, SVG-OM, SVG-D1   Severe claudication (HCC) 05/2013   Referred for Peripheral Angio (Dr. Allyson Sabal --> Dr. Hoy Finlay in [place])   [facility] ALLERGY 01/14/2010   Qualifier: Diagnosis of  By: Candice Camp CMA (AAMA), [name]     Status post total replacement of right hip 10/12/2018   Vertigo 01/02/2014    Family History  Adopted: Yes  Problem Relation Age of Onset   Hypertension Mother    Hyperlipidemia Mother    Hyperlipidemia Father    Hypertension Father     Past Surgical History:  Procedure Laterality Date   ATHERECTOMY Right 05/15/2013   Procedure: ATHERECTOMY;  Surgeon: Runell Gess, MD;  Location: [facility] CATH LAB;  Service: Cardiovascular;  Laterality: Right;  popliteal   BACK SURGERY     CARDIAC CATHETERIZATION  07/17/01   2 v CAD with LM, circ, LAD, obtuse diag, mod stenosis of diag vein graft , mild stenosis of marg vein graft, nl EF, medical treatment   CARDIAC CATHETERIZATION  11/2013   Widely patent LIMA-LAD & SVG-OM with mild progression of proximal stenosis ~40-50% in SVG-D1; Widely patent native RCA with known severe native LCA disease   CARDIAC CATHETERIZATION N/A 11/01/2016   Procedure: Left Heart Cath and Cors/Grafts Angiography;  Surgeon: [name], MD;  Location: [facility] INVASIVE CV LAB;  Service: Cardiovascular: Hemodynamics: High LVEDP!.  Cors/Grafts: LM 55%, o-p LAD 100% then after D1 -> LIMA-LAD patent, SVG-Diag ~55%. small RI wiht ~70% ostial. O-p Cx 80% - pCx 70% --> SVG-bifurcating OM patent. -- med Rx   CORONARY ANGIOPLASTY  1992   PCI to LAD   CORONARY ARTERY BYPASS GRAFT  1998   LIMA-LAD, SVG-diagonal (none  roughly 50% stenosis), SVG-OM   LEFT HEART CATHETERIZATION WITH CORONARY ANGIOGRAM N/A 11/27/2013   Procedure: LEFT HEART CATHETERIZATION WITH CORONARY ANGIOGRAM;  Surgeon: [name], MD;  Location: [facility] CATH LAB;  Service: Cardiovascular;  Laterality: N/A;   LOW EXTREMITY DOPPLERS/ABI  10/2016   Patent right SFA, popliteal and peroneal tendons. Patent left SFA and popliteal stent. 30-50% stenosis in the right common femoral and popliteal arteries, 50-74% stenosis in left profunda femoris artery. --> 1 year follow-up.  RABI - 1.2, LABI 1.1   LOWER EXTREMITY ANGIOGRAM N/A 02/22/2013   Procedure: LOWER EXTREMITY ANGIOGRAM;  Surgeon: Runell Gess, MD;  Location: [facility] CATH LAB;  Service: Cardiovascular;  Laterality: N/A;   LOWER EXTREMITY ANGIOGRAM N/A 07/20/2013   Procedure: LOWER EXTREMITY ANGIOGRAM;  Surgeon: Runell Gess, MD;  Location: [facility] CATH LAB;  Service: Cardiovascular;  Laterality: N/A;   NM MYOVIEW LTD  04/03/2013; 5/26'/2016   Lexiscan: a)  Apical perfusion defect with no ischemia. Consider prior infarct versus breast attenuation.;; b) 5/'16: LOW RISK, Nl EF ~55-65%, No Ischemia /Infarction   NM MYOVIEW LTD  10/'19; 4/'21   a) LOW RISK.  EF 55%.  No ischemia or infarction. b) EF estimated 81%.  Suboptimal images.  May be subtle inferior ischemia-initially read by radiology as intermediate risk, however overread by cardiology to be mild low risk.Marland Kitchen   PERCUTANEOUS STENT INTERVENTION Right 05/15/2013   Procedure: PERCUTANEOUS STENT INTERVENTION;  Surgeon: Runell Gess, MD;  Location: [facility] CATH LAB;  Service: Cardiovascular;  Laterality: Right;  tibioperoneal trunk   Peroneal Artery Stent  05/15/13   Diamondback rot. atherectomy of high grade segmental popliteal stenosis then Chocolate balloonPTA; then angiosculpt PTA of the prox peroneal with stent-Xpedition    pv angio  07/20/13   high-grade calcified popliteal disease with one-vessel runoff via a small anterior tibial artery that has  proximal disease as well, no intervention   TOTAL HIP ARTHROPLASTY Right 10/12/2018   Procedure: RIGHT TOTAL HIP ARTHROPLASTY ANTERIOR APPROACH;  Surgeon: Kathryne Hitch, MD;  Location: [facility] ORS;  Service: Orthopedics;  Laterality: Right;   TOTAL HIP ARTHROPLASTY Left 11/21/2020   Procedure: LEFT TOTAL HIP ARTHROPLASTY ANTERIOR APPROACH;  Surgeon: Kathryne Hitch, MD;  Location: [facility] ORS;  Service: Orthopedics;  Laterality: Left;   TRANSTHORACIC ECHOCARDIOGRAM  11/'17; 10/19   a) EF 55-60%. Gr 1 DD. Normal PAP. Aortic Sclerosis.;; b) Normal LV size and function with vigorous EF 65 to 70%.  GR 1 DD.  Severe LA dilation (suggestive of probably worsening: GR 1 DD).   TRANSTHORACIC ECHOCARDIOGRAM  03/18/2020   EF 70-75%.  Hyperdynamic.  GR 1 DD.  No R WMA.  Mild LA dilation.  Mild aortic valve sclerosis but no stenosis.-No change from prior echo   TUBAL LIGATION     Social History   Occupational History   Occupation: retired  Tobacco Use   Smoking status: Never   Smokeless tobacco: Never  Vaping Use   Vaping status: Never Used  Substance and Sexual Activity   Alcohol use: Not Currently   Drug use: No   Sexual activity: Not on file

## 2023-09-28 ENCOUNTER — Telehealth: Payer: Self-pay | Admitting: Orthopedic Surgery

## 2023-09-28 NOTE — Telephone Encounter (Signed)
Patient called and said she has this leg brace but it is hurting her leg from the ankle up to the knee. CB#[phone]

## 2023-09-29 NOTE — Telephone Encounter (Signed)
lmtcb

## 2023-10-03 NOTE — Telephone Encounter (Signed)
Pt informed of info below. She will use ACE wrap for now and make sure that she elevates legs during the day while sitting/laying

## 2023-10-20 ENCOUNTER — Telehealth: Payer: Self-pay | Admitting: Cardiovascular Disease

## 2023-10-20 MED ORDER — HYDRALAZINE HCL 25 MG PO TABS: 25.0000 mg | ORAL_TABLET | Freq: Three times a day (TID) | ORAL | 1 refills | Status: DC

## 2023-10-20 NOTE — Telephone Encounter (Signed)
*  STAT* If patient is at the pharmacy, call can be transferred to refill team.   1. Which medications need to be refilled? (please list name of each medication and dose if known)   hydrALAZINE (APRESOLINE) 25 MG tablet     2. Would you like to learn more about the convenience, safety, & potential cost savings by using the [facility]? No   3. Are you open to using the [facility] No  4. Which pharmacy/location (including street and city if local pharmacy) is medication to be sent to? [id number] - [place], [place] - [address]    5. Do they need a 30 day or 90 day supply? 90 Day Supply

## 2023-11-09 ENCOUNTER — Telehealth: Payer: Self-pay | Admitting: Cardiovascular Disease

## 2023-11-09 ENCOUNTER — Other Ambulatory Visit: Payer: Self-pay

## 2023-11-09 MED ORDER — NITROGLYCERIN 0.4 MG SL SUBL: 0.4000 mg | SUBLINGUAL_TABLET | SUBLINGUAL | 1 refills | Status: DC | PRN

## 2023-11-09 NOTE — Telephone Encounter (Signed)
RX sent to requested Pharmacy

## 2023-11-09 NOTE — Telephone Encounter (Signed)
*  STAT* If patient is at the pharmacy, call can be transferred to refill team.   1. Which medications need to be refilled? (please list name of each medication and dose if known)   nitroGLYCERIN (NITROSTAT) 0.4 MG SL tablet    2. Which pharmacy/location (including street and city if local pharmacy) is medication to be sent to? [id number] [place], [place] - [address] Phone: [phone]  Fax: [phone]     3. Do they need a 30 day or 90 day supply? 90 Pt is out of medication

## 2023-11-19 ENCOUNTER — Emergency Department (HOSPITAL_BASED_OUTPATIENT_CLINIC_OR_DEPARTMENT_OTHER): Payer: Medicare Other | Admitting: Radiology

## 2023-11-19 ENCOUNTER — Other Ambulatory Visit: Payer: Self-pay

## 2023-11-19 ENCOUNTER — Emergency Department (HOSPITAL_BASED_OUTPATIENT_CLINIC_OR_DEPARTMENT_OTHER): Payer: Medicare Other

## 2023-11-19 ENCOUNTER — Encounter (HOSPITAL_BASED_OUTPATIENT_CLINIC_OR_DEPARTMENT_OTHER): Payer: Self-pay | Admitting: Emergency Medicine

## 2023-11-19 ENCOUNTER — Emergency Department (HOSPITAL_BASED_OUTPATIENT_CLINIC_OR_DEPARTMENT_OTHER)
Admission: EM | Admit: 2023-11-19 | Discharge: 2023-11-19 | Disposition: A | Discharge status: Home / Self Care | Payer: Medicare Other | Attending: Emergency Medicine | Admitting: Emergency Medicine

## 2023-11-19 DIAGNOSIS — Z91040 Latex allergy status: Secondary | ICD-10-CM | POA: Insufficient documentation

## 2023-11-19 DIAGNOSIS — Z7901 Long term (current) use of anticoagulants: Secondary | ICD-10-CM | POA: Diagnosis not present

## 2023-11-19 DIAGNOSIS — S8002XA Contusion of left knee, initial encounter: Secondary | ICD-10-CM | POA: Diagnosis not present

## 2023-11-19 DIAGNOSIS — S0990XA Unspecified injury of head, initial encounter: Secondary | ICD-10-CM | POA: Diagnosis present

## 2023-11-19 DIAGNOSIS — W01198A Fall on same level from slipping, tripping and stumbling with subsequent striking against other object, initial encounter: Secondary | ICD-10-CM | POA: Diagnosis not present

## 2023-11-19 NOTE — ED Notes (Signed)
Patient with xray in room

## 2023-11-19 NOTE — Discharge Instructions (Signed)
It was a pleasure taking care of [name] today  All of her imaging looked good she does have a small hematoma which is essentially the bump on her right forehead.  This will go away over time.  Make sure to keep close watch, follow-up outpatient, return for any worsening symptoms

## 2023-11-19 NOTE — ED Triage Notes (Signed)
Pt. Was taking out trash and on way back slipped and fell hitting her head. Pt. Has abrasion and lump to right forehead. Pt. Does take Plavix. Pt. Went to [facility] and was sent here for CT scan. Pt. Daughter with her in room.

## 2023-11-19 NOTE — ED Provider Notes (Signed)
[name]   CSN: [id number] Arrival date & time: 11/19/23  1736    History  Chief Complaint  Patient presents with   Fall    Pt. Slipped and fell while emptying trash. Pt. Hit head. Pt. Takes plavix.  Pt. Was sent from [facility].    [name] is a 89 y.o. female here for evaluation mechanical fall.  States she was taking the trash out wearing her bedroom slippers and she tripped and fell.  She denies any syncope.  She hit the frontal aspect of her head and her bilateral knees.  She is on Plavix.  She states she had to crawl to get up to a railing to get back on her feet.  She was able to walk afterwards.  She lives by herself.  She has had some nausea since.  Her tetanus is up-to-date.  No neck pain, back pain, chest pain, shortness of breath, abdominal pain.  No numbness or weakness.  HPI     Home Medications Prior to Admission medications   Medication Sig Start Date End Date Taking? Authorizing Provider  acetaminophen (TYLENOL) 650 MG CR tablet Take 650 mg by mouth daily as needed for pain.   Yes [provider]  carvedilol (COREG) 6.25 MG tablet TAKE 1 TABLET BY MOUTH TWICE A DAY Patient taking differently: Take 6.25 mg by mouth 2 (two) times daily with a meal. 12/07/22  Yes Runell Gess, MD  Cholecalciferol (VITAMIN D3) 10 MCG (400 UNIT) tablet Take 400 Units by mouth daily.   Yes [provider]  clopidogrel (PLAVIX) 75 MG tablet Take 75 mg by mouth at bedtime.   Yes [provider]  ezetimibe (ZETIA) 10 MG tablet Take 10 mg by mouth at bedtime.   Yes [provider]  famotidine (PEPCID) 40 MG tablet Take 40 mg by mouth at bedtime.   Yes [provider]  folic acid (FOLVITE) 1 MG tablet Take 1 mg by mouth daily. 08/21/18  Yes [provider]  gabapentin (NEURONTIN) 400 MG capsule Take 400 mg by mouth 2 (two) times daily. 09/07/22  Yes [provider]   glimepiride (AMARYL) 2 MG tablet Take 2 mg by mouth daily with breakfast. 01/07/22  Yes [provider]  hydrALAZINE (APRESOLINE) 25 MG tablet Take 1 tablet (25 mg total) by mouth every 8 (eight) hours. 10/20/23  Yes Runell Gess, MD  isosorbide mononitrate (IMDUR) 60 MG 24 hr tablet TAKE 1.5 TABLETS (90 MG TOTAL) BY MOUTH DAILY. Marland Kitchen Patient taking differently: Take 60 mg by mouth daily. . 06/03/23  Yes Runell Gess, MD  methotrexate (RHEUMATREX) 2.5 MG tablet Take 20 mg by mouth every Sunday. (8 tablets) 08/21/18  Yes [provider]  predniSONE (DELTASONE) 1 MG tablet Take 4 mg by mouth daily. 05/02/23  Yes [provider]  rosuvastatin (CRESTOR) 40 MG tablet TAKE 1 TABLET BY MOUTH EVERYDAY AT BEDTIME Patient taking differently: Take 40 mg by mouth at bedtime. 01/16/21  Yes Runell Gess, MD  denosumab (PROLIA) 60 MG/ML SOSY injection Inject 60 mg into the skin every 6 (six) months.    [provider]  LORazepam (ATIVAN) 0.5 MG tablet Take 0.5 mg by mouth daily as needed. 12/04/21   [provider]  nitroGLYCERIN (NITROSTAT) 0.4 MG SL tablet Place 1 tablet (0.4 mg total) under the tongue every 5 (five) minutes as needed for chest pain. 11/09/23 02/07/24  Runell Gess, MD  traMADol (ULTRAM) 50 MG tablet Take 1 tablet (50 mg total) by mouth 3 (three) times daily as needed. 09/01/23   Kathryne Hitch, MD      Allergies    Fish allergy, Iodinated contrast media, Latex, Shellfish allergy, Amlodipine, Evolocumab, Other, and Metformin    Review of Systems   Review of Systems  Constitutional: Negative.   HENT: Negative.    Respiratory: Negative.    Cardiovascular: Negative.   Gastrointestinal:  Positive for nausea. Negative for abdominal distention, abdominal pain, anal bleeding, blood in stool, constipation, diarrhea, rectal pain and vomiting.  Genitourinary: Negative.   Musculoskeletal:        Bilateral knee pain  Skin:  Positive  for wound.  Neurological: Negative.   All other systems reviewed and are negative.   Physical Exam Updated Vital Signs BP (!) 171/72   Pulse 60   Temp 97.6 F (36.4 C) (Oral)   Resp 18   SpO2 97%  Physical Exam Vitals and nursing Flores reviewed.  Constitutional:      General: She is not in acute distress.    Appearance: She is well-developed. She is not ill-appearing, toxic-appearing or diaphoretic.  HENT:     Head: Normocephalic.     Comments: Abrasion, small hematoma right forehead no raccoon eye, Battle sign    Ears:     Comments: No hemotympanums    Nose: Nose normal.  Eyes:     Pupils: Pupils are equal, round, and reactive to light.  Neck:     Trachea: Trachea and phonation normal.     Comments: No Midline neck pain, full range of motion Cardiovascular:     Rate and Rhythm: Normal rate.     Pulses: Normal pulses.          Radial pulses are 2+ on the right side and 2+ on the left side.       Dorsalis pedis pulses are 2+ on the right side and 2+ on the left side.     Heart sounds: Normal heart sounds.  Pulmonary:     Effort: Pulmonary effort is normal. No respiratory distress.     Breath sounds: Normal breath sounds and air entry.  Chest:     Comments: Nontender chest wall Abdominal:     General: There is no distension.  Musculoskeletal:        General: Normal range of motion.     Cervical back: Full passive range of motion without pain, normal range of motion and neck supple.  Skin:    General: Skin is warm and dry.     Comments: Chronic skin changes to bilateral ankles  Neurological:     General: No focal deficit present.     Mental Status: She is alert.  Psychiatric:        Mood and Affect: Mood normal.     ED Results / Procedures / Treatments   Labs (all labs ordered are listed, but only abnormal results are displayed) Labs Reviewed - No data to display  EKG None  Radiology DG Pelvis 1-2 Views  Result Date: 11/19/2023 CLINICAL DATA:  Fall EXAM:  PELVIS - 1-2 VIEW COMPARISON:  07/18/2023 FINDINGS: Bilateral hip replacements. No hardware complicating feature. No acute bony abnormality. Specifically, no fracture, subluxation, or dislocation. Diffuse vascular calcifications. IMPRESSION: No acute bony abnormality. Electronically Signed   By: Charlett Nose M.D.   On: 11/19/2023 19:53   DG Knee Complete 4 Views Left  Result Date: 11/19/2023 CLINICAL DATA:  Fall, pain  EXAM: LEFT KNEE - COMPLETE 4+ VIEW COMPARISON:  None Available. FINDINGS: Degenerative changes with joint space narrowing and spurring. No joint effusion. No acute bony abnormality. Specifically, no fracture, subluxation, or dislocation. Diffuse vascular calcifications noted. Popliteal stent in place. IMPRESSION: No acute bony abnormality. Electronically Signed   By: Charlett Nose M.D.   On: 11/19/2023 19:52   DG Knee Complete 4 Views Right  Result Date: 11/19/2023 CLINICAL DATA:  Fall, pain EXAM: RIGHT KNEE - COMPLETE 4+ VIEW COMPARISON:  None Available. FINDINGS: No acute bony abnormality. Specifically, no fracture, subluxation, or dislocation. No joint effusion. Diffuse vascular calcifications. IMPRESSION: No acute bony abnormality. Electronically Signed   By: Charlett Nose M.D.   On: 11/19/2023 19:51   DG Chest Port 1 View  Result Date: 11/19/2023 CLINICAL DATA:  Fall EXAM: PORTABLE CHEST 1 VIEW COMPARISON:  10/19/2022 FINDINGS: Prior CABG. Low lung volumes. Linear scarring at the left lung base. No acute confluent airspace opacities or effusions. Aortic atherosclerosis. No acute bony abnormality. IMPRESSION: Low lung volumes.  No active disease. Electronically Signed   By: Charlett Nose M.D.   On: 11/19/2023 19:50   CT Head Wo Contrast  Result Date: 11/19/2023 CLINICAL DATA:  Fall, right forehead abrasion, on Plavix EXAM: CT HEAD WITHOUT CONTRAST CT CERVICAL SPINE WITHOUT CONTRAST TECHNIQUE: Multidetector CT imaging of the head and cervical spine was performed following the standard  protocol without intravenous contrast. Multiplanar CT image reconstructions of the cervical spine were also generated. RADIATION DOSE REDUCTION: This exam was performed according to the departmental dose-optimization program which includes automated exposure control, adjustment of the mA and/or kV according to patient size and/or use of iterative reconstruction technique. COMPARISON:  MRI brain dated 05/05/2023 FINDINGS: CT HEAD FINDINGS Brain: No evidence of acute infarction, hemorrhage, hydrocephalus, extra-axial collection or mass lesion/mass effect. Subcortical white matter and periventricular small vessel ischemic changes. Vascular: Intracranial atherosclerosis. Skull: Normal. Negative for fracture or focal lesion. Sinuses/Orbits: The visualized paranasal sinuses are essentially clear. The mastoid air cells are unopacified. Other: Mild soft tissue swelling overlying the right frontal bone. CT CERVICAL SPINE FINDINGS Alignment: Straightening of the cervical spine. Skull base and vertebrae: No acute fracture. No primary bone lesion or focal pathologic process. Soft tissues and spinal canal: No prevertebral fluid or swelling. No visible canal hematoma. Disc levels: Mild degenerative changes of the mid/lower cervical spine. Spinal canal is patent. Upper chest: Visualized lung apices are clear. Other: Visualized thyroid is unremarkable. IMPRESSION: Mild soft tissue swelling overlying the right frontal bone. No evidence of calvarial fracture. No acute intracranial abnormality. Small vessel ischemic changes. No traumatic injury to the cervical spine. Mild degenerative changes. Electronically Signed   By: Charline Bills M.D.   On: 11/19/2023 19:33   CT Cervical Spine Wo Contrast  Result Date: 11/19/2023 CLINICAL DATA:  Fall, right forehead abrasion, on Plavix EXAM: CT HEAD WITHOUT CONTRAST CT CERVICAL SPINE WITHOUT CONTRAST TECHNIQUE: Multidetector CT imaging of the head and cervical spine was performed  following the standard protocol without intravenous contrast. Multiplanar CT image reconstructions of the cervical spine were also generated. RADIATION DOSE REDUCTION: This exam was performed according to the departmental dose-optimization program which includes automated exposure control, adjustment of the mA and/or kV according to patient size and/or use of iterative reconstruction technique. COMPARISON:  MRI brain dated 05/05/2023 FINDINGS: CT HEAD FINDINGS Brain: No evidence of acute infarction, hemorrhage, hydrocephalus, extra-axial collection or mass lesion/mass effect. Subcortical white matter and periventricular small vessel ischemic changes. Vascular: Intracranial atherosclerosis.  Skull: Normal. Negative for fracture or focal lesion. Sinuses/Orbits: The visualized paranasal sinuses are essentially clear. The mastoid air cells are unopacified. Other: Mild soft tissue swelling overlying the right frontal bone. CT CERVICAL SPINE FINDINGS Alignment: Straightening of the cervical spine. Skull base and vertebrae: No acute fracture. No primary bone lesion or focal pathologic process. Soft tissues and spinal canal: No prevertebral fluid or swelling. No visible canal hematoma. Disc levels: Mild degenerative changes of the mid/lower cervical spine. Spinal canal is patent. Upper chest: Visualized lung apices are clear. Other: Visualized thyroid is unremarkable. IMPRESSION: Mild soft tissue swelling overlying the right frontal bone. No evidence of calvarial fracture. No acute intracranial abnormality. Small vessel ischemic changes. No traumatic injury to the cervical spine. Mild degenerative changes. Electronically Signed   By: Charline Bills M.D.   On: 11/19/2023 19:33    Procedures Procedures    Medications Ordered in ED Medications - No data to display  ED Course/ Medical Decision Making/ A&P   [name] is an 89 year old here for evaluation of mechanical fall PTA while standing to take the trash out  tripping over her bedroom slippers.  On arrival she is neurovascularly intact.  She does have a small right frontal scalp hematoma as well as abrasion.  Her tetanus is up-to-date.  She denies any syncope or presyncopal symptoms.  She has an abrasion to her left knee.  No shortening rotation of legs.  She was seen by urgent care there was concern as she is on Plavix for head injury subsequently sent her here for further evaluation  Imaging personally viewed and interpreted:  No significant abnormality  Discussed results with patient, daughter in room.  She is ambulatory here without any difficulty.  Will have her follow-up outpatient, return for any worsening symptoms.  The patient has been appropriately medically screened and/or stabilized in the ED. I have low suspicion for any other emergent medical condition which would require further screening, evaluation or treatment in the ED or require inpatient management.  Patient is hemodynamically stable and in no acute distress.  Patient able to ambulate in department prior to ED.  Evaluation does not show acute pathology that would require ongoing or additional emergent interventions while in the emergency department or further inpatient treatment.  I have discussed the diagnosis with the patient and answered all questions.  Pain is been managed while in the emergency department and patient has no further complaints prior to discharge.  Patient is comfortable with plan discussed in room and is stable for discharge at this time.  I have discussed strict return precautions for returning to the emergency department.  Patient was encouraged to follow-up with PCP/specialist refer to at discharge.                                Medical Decision Making Amount and/or Complexity of Data Reviewed Independent Historian:     Details: Family in room External Data Reviewed: labs, radiology and notes. Radiology: ordered and independent interpretation performed.  Decision-making details documented in ED Course.  Risk OTC drugs. Decision regarding hospitalization. Diagnosis or treatment significantly limited by social determinants of health.           Final Clinical Impression(s) / ED Diagnoses Final diagnoses:  Injury of head, initial encounter  Contusion of left knee, initial encounter    Rx / DC Orders ED Discharge Orders     None  Brayn Eckstein A, PA-C 11/19/23 2331    Virgina Norfolk, DO 11/20/23 [id number]

## 2023-11-19 NOTE — ED Notes (Signed)
Ambulated around the room without assistance.

## 2023-11-19 NOTE — ED Notes (Signed)
Patient transported to CT 

## 2023-11-27 ENCOUNTER — Other Ambulatory Visit: Payer: Self-pay | Admitting: Cardiovascular Disease

## 2023-12-14 ENCOUNTER — Other Ambulatory Visit: Payer: Self-pay | Admitting: Cardiovascular Disease

## 2023-12-19 ENCOUNTER — Ambulatory Visit: Payer: Medicare Other | Admitting: Podiatry

## 2023-12-22 ENCOUNTER — Encounter: Payer: Self-pay | Admitting: Podiatry

## 2023-12-22 ENCOUNTER — Ambulatory Visit: Payer: Medicare Other | Admitting: Podiatry

## 2023-12-22 ENCOUNTER — Ambulatory Visit (HOSPITAL_BASED_OUTPATIENT_CLINIC_OR_DEPARTMENT_OTHER): Payer: Medicare Other | Admitting: Internal Medicine

## 2023-12-22 DIAGNOSIS — B351 Tinea unguium: Secondary | ICD-10-CM

## 2023-12-22 DIAGNOSIS — M79674 Pain in right toe(s): Secondary | ICD-10-CM | POA: Diagnosis not present

## 2023-12-22 DIAGNOSIS — M79675 Pain in left toe(s): Secondary | ICD-10-CM | POA: Diagnosis not present

## 2023-12-22 DIAGNOSIS — I739 Peripheral vascular disease, unspecified: Secondary | ICD-10-CM

## 2023-12-22 DIAGNOSIS — E1151 Type 2 diabetes mellitus with diabetic peripheral angiopathy without gangrene: Secondary | ICD-10-CM

## 2023-12-22 NOTE — Progress Notes (Signed)
  Subjective:  Patient ID: [name], female    DOB: 04-01-1933,  MRN: [id number]  Chief Complaint  Patient presents with   Ingrown Toenail    Patient states her feet have been hurting since last visit, no medication for pain    89 y.o. female presents with the above complaint. History confirmed with patient. Patient presenting with pain related to dystrophic thickened elongated nails. Patient is unable to trim own nails related to nail dystrophy and/or mobility issues. Patient does have a history of T2DM.  She does have history of PAD and is on Plavix .  She states that the borders of the great toes are becoming painful.  Objective:  Physical Exam: Skin temperature gradient warm to cool.  Capillary refill 3 to 5 seconds to the digits.  Pedal hair growth absent.  Pedal skin is atrophic. nail exam onychomycosis of the toenails, onycholysis, dystrophic nails, and incurvated borders to bilateral great toes. protective sensation intact, DP and PT pulses nonpalpable. Left Foot:  Pain with palpation of nails due to elongation and dystrophic growth.  Right Foot: Pain with palpation of nails due to elongation and dystrophic growth.   Assessment:   1. Pain due to onychomycosis of toenails of both feet   2. Type II diabetes mellitus with peripheral circulatory disorder (HCC)   3. PAD (peripheral artery disease) (HCC)      Plan:  Patient was evaluated and treated and all questions answered.  #Onychomycosis with pain  -Nails palliatively debrided as below. -Slant back nail trim performed for bilateral first toe bilateral borders. -Educated on self-care  Procedure: Nail Debridement Rationale: Pain Type of Debridement: manual, sharp debridement. Instrumentation: Nail nipper, rotary burr. Number of Nails: 10  Patient educated on diabetes. Discussed proper diabetic foot care and discussed risks and complications of disease. Educated patient in depth on reasons to return to the office  immediately should he/she discover anything concerning or new on the feet. All questions answered. Discussed proper shoes as well.    Return in about 3 months (around 03/21/2024) for Diabetic Foot Care.         Ethan Saddler, DPM [facility] / [facility]

## 2023-12-30 ENCOUNTER — Other Ambulatory Visit: Payer: Self-pay | Admitting: Cardiovascular Disease

## 2024-01-09 ENCOUNTER — Other Ambulatory Visit: Payer: Self-pay | Admitting: Cardiovascular Disease

## 2024-01-17 ENCOUNTER — Ambulatory Visit: Payer: Medicare Other | Attending: Cardiovascular Disease | Admitting: Cardiovascular Disease

## 2024-01-17 ENCOUNTER — Encounter: Payer: Self-pay | Admitting: Cardiovascular Disease

## 2024-01-17 VITALS — BP 80/52 | HR 90 | Ht 63.0 in | Wt 148.6 lb

## 2024-01-17 DIAGNOSIS — I739 Peripheral vascular disease, unspecified: Secondary | ICD-10-CM

## 2024-01-17 DIAGNOSIS — R600 Localized edema: Secondary | ICD-10-CM | POA: Diagnosis not present

## 2024-01-17 DIAGNOSIS — E785 Hyperlipidemia, unspecified: Secondary | ICD-10-CM

## 2024-01-17 DIAGNOSIS — I2510 Atherosclerotic heart disease of native coronary artery without angina pectoris: Secondary | ICD-10-CM

## 2024-01-17 DIAGNOSIS — I10 Essential (primary) hypertension: Secondary | ICD-10-CM | POA: Diagnosis not present

## 2024-01-17 NOTE — Assessment & Plan Note (Signed)
 Long history of PAD status post diamondback orbital rotational atherectomy, PTA of the right SFA and stenting of her peroneal which I performed 05/16/2013.  Because of complex disease after restarting her 07/20/2013 I referred her to Dr. Zachary Clause who revascularized her 10/01/2013.  Dopplers performed 11/10/2016 revealed normal ABIs bilaterally with patent SFAs and infrapopliteal vessels.  She had Dopplers performed/19/21 that showed a right ABI of 0.39 and an occluded right popliteal artery, left ABI 1.12.  She really denies claudication.

## 2024-01-17 NOTE — Progress Notes (Signed)
 01/17/2024 KATHALEEN Flores   [birth year]  [id number]  Primary Physician Gerome Brunet, DO Primary Cardiologist: Dorn JINNY Lesches MD [facility] [place] [place], [place]  HPI:  [name] is a 89 y.o.   married African American female patient of Dr. Alm Harding's and Dr. Jameson. I last saw her in the office 04/13/2023.SABRAShe is accompanied by her daughter Jackson today.  She has known CAD status post coronary bypass grafting in the past (1998). She has preserved LV function and nonischemic Myoview . Because of claudication she underwent peripheral vascular angiography and other Alm Clay 02/22/13 revealing significant popliteal and infrapopliteal disease. She has lifestyle limiting claudication.on 05/16/13 she underwent diamondback orbital rotational atherectomy, PTA of her distal right SFA and stenting using coronary drug-eluting stent for peroneal artery which was her only patent artery on the right. She does not notice any improvement and claudication since her procedure although she says her right leg his less heavy and that her left leg is much more symptomatic.I restudied her 07/20/13 with the intention of fixing her left leg however I decided to defer this because of anatomic complexity. Ultimately I  referred her to Dr. Zachary Clause in [place] who performed intervention on her left popliteal and tibioperoneal trunk on 10/01/13. Followup Dopplers performed 04/29/14 were excellent with ABIs of greater than 1 bilaterally, patent peroneal stent on the right a patent left popliteal. Subsequent Dopplers performed in December showed a right ABI of 0.81 and a left ABI 0.91 with patent stents   She had done well after intervention and actually said that she was able to ambulate with claudication. Her last Doppler studies performed 11/10/16 revealed normal ABIs bilaterally with patent SFAs and infrapopliteal vessels. She has noticed some superficial hypersensitivity on her left pretibial area that had venous Doppler  studies performed recently that showed no evidence of DVT.    She did have a right total hip replacement 11/19 which she is still recovering from.  Recent lower extremity arterial Doppler studies performed 06/21/2019 revealed a decline in her right ABI 0..76 with a high-frequency signal in her right popliteal artery and monophasic waveforms below that.  She complains of pain in both legs from the knee down left greater than right at rest and with exertion despite normal left ABIs and a palpable pedal pulse on that side.    She apparently needs an elective left total knee replacement.  She has difficulty ambulating mostly because of pain in her left leg.  She also has chronic lower extremity edema left greater than right on low-dose furosemide  which we are going to increase from 20 to 40 mg a day.  She takes occasional sublingual nitroglycerin  every other week for chest pain.  She did have a nonischemic Myoview  for preoperative clearance before her right total hip replacement 09/15/2018.   She was admitted to the hospital on 03/18/2020 with chest pain.  She ruled out for myocardial infarction.  A Myoview  stress test was read as low risk with mild inferior ischemia.  She also had lower extremity arterial Doppler studies performed in our office 03/31/2020 which revealed a right ABI of 0.39 with an occluded right popliteal artery and a left of 1.12.  I cleared her for her left total hip replacement low risk given her recent Myoview  stress test results.  She does not really complain of lifestyle limiting claudication on the right.   Unfortunately her left total hip replacement was delayed because of COVID-19 and i that was successfully performed  in December of last year..  She had a low risk Myoview  stress test.  She had a right total knee replacement back in 2019.  She denies chest pain or shortness of breath.  Since I saw her in the office 9 months ago she has had a month of nausea vomiting diarrhea for unclear  reasons.  She measured her blood pressure this morning at home was 100 systolic.  In the office today her blood pressure is 80/52.  She appears somewhat dry.  I am going to discontinue her hydralazine .  She denies chest pain or shortness of breath.   Current Meds  Medication Sig   acetaminophen  (TYLENOL ) 650 MG CR tablet Take 650 mg by mouth daily as needed for pain.   carvedilol  (COREG ) 6.25 MG tablet Take 1 tablet (6.25 mg total) by mouth 2 (two) times daily with a meal.   Cholecalciferol  (VITAMIN D3) 10 MCG (400 UNIT) tablet Take 400 Units by mouth daily.   clopidogrel  (PLAVIX ) 75 MG tablet Take 75 mg by mouth at bedtime.   denosumab (PROLIA) 60 MG/ML SOSY injection Inject 60 mg into the skin every 6 (six) months.   ezetimibe  (ZETIA ) 10 MG tablet Take 10 mg by mouth at bedtime.   folic acid  (FOLVITE ) 1 MG tablet Take 1 mg by mouth daily.   gabapentin  (NEURONTIN ) 400 MG capsule Take 400 mg by mouth 2 (two) times daily.   glimepiride  (AMARYL ) 2 MG tablet Take 2 mg by mouth daily with breakfast.   isosorbide  mononitrate (IMDUR ) 60 MG 24 hr tablet TAKE 1.5 TABLETS (90 MG TOTAL) BY MOUTH DAILY. . (Patient taking differently: Take 60 mg by mouth daily. [name])   Multiple Vitamin (MULTIVITAMIN) capsule Take 1 capsule by mouth daily.   nitroGLYCERIN  (NITROSTAT ) 0.4 MG SL tablet PLACE 1 TABLET UNDER THE TONGUE EVERY 5 MINUTES AS NEEDED FOR CHEST PAIN.   predniSONE  (DELTASONE ) 1 MG tablet Take 4 mg by mouth daily.   rosuvastatin  (CRESTOR ) 20 MG tablet Take 20 mg by mouth daily.   [DISCONTINUED] hydrALAZINE  (APRESOLINE ) 25 MG tablet Take 1 tablet (25 mg total) by mouth every 8 (eight) hours.     Allergies  Allergen Reactions   Fish Allergy Hives and Shortness Of Breath   Iodinated Contrast Media Shortness Of Breath and Anaphylaxis    Other reaction(s): Unknown   Latex Anaphylaxis, Hives, Shortness Of Breath, Itching and Other (See Comments)    REACTION: wheezing Other reaction(s): Unknown    Shellfish Allergy Anaphylaxis, Hives, Shortness Of Breath and Other (See Comments)    All seafood   Amlodipine  Swelling   Evolocumab  Other (See Comments)    Other reaction(s): latex on syringe Unknown reaction   Other Itching and Other (See Comments)    Patient reports allergy to perfumed detergents   Metformin Nausea And Vomiting    Social History   Socioeconomic History   Marital status: Married    Spouse name: Not on file   Number of children: Not on file   Years of education: Not on file   Highest education level: Not on file  Occupational History   Occupation: retired  Tobacco Use   Smoking status: Never   Smokeless tobacco: Never  Vaping Use   Vaping status: Never Used  Substance and Sexual Activity   Alcohol  use: Not Currently   Drug use: No   Sexual activity: Not on file  Other Topics Concern   Not on file  Social History Narrative   Right handed  Lives alone ( husband in rehab facility)   Caffeine- mostly coffee       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   Social Drivers of Health   Financial Resource Strain: Not on file  Food Insecurity: Low Risk  (07/14/2023)   Received from [facility]   Hunger Vital Sign    Worried About Running Out of Food in the Last Year: Never true    Ran Out of Food in the Last Year: Never true  Transportation Needs: Not on file (07/14/2023)  Physical Activity: Not on file  Stress: Not on file  Social Connections: Not on file  Intimate Partner Violence: Not At Risk (05/04/2023)   Humiliation, Afraid, Rape, and Kick questionnaire    Fear of Current or Ex-Partner: No    Emotionally Abused: No    Physically Abused: No    Sexually Abused: No     Review of Systems: General: negative for chills, fever, night sweats or weight changes.  Cardiovascular: negative for chest pain, dyspnea on exertion, edema, orthopnea, palpitations, paroxysmal nocturnal dyspnea or shortness of breath Dermatological: negative for rash Respiratory: negative for cough or  wheezing Urologic: negative for hematuria Abdominal: negative for nausea, vomiting, diarrhea, bright red blood per rectum, melena, or hematemesis Neurologic: negative for visual changes, syncope, or dizziness All other systems reviewed and are otherwise negative except as noted above.    Blood pressure (!) 80/52, pulse 90, height 5' 3 (1.6 m), weight 148 lb 9.6 oz (67.4 kg), SpO2 97%.  General appearance: alert and no distress Neck: no adenopathy, no carotid bruit, no JVD, supple, symmetrical, trachea midline, and thyroid  not enlarged, symmetric, no tenderness/mass/nodules Lungs: clear to auscultation bilaterally Heart: regular rate and rhythm, S1, S2 normal, no murmur, click, rub or gallop Extremities: extremities normal, atraumatic, no cyanosis or edema Pulses: 2+ and symmetric Skin: Skin color, texture, turgor normal. No rashes or lesions Neurologic: Grossly normal  EKG EKG Interpretation Date/Time:  Tuesday January 17 2024 15:10:57 EST Ventricular Rate:  90 PR Interval:  190 QRS Duration:  76 QT Interval:  388 QTC Calculation: 474 R Axis:   48  Text Interpretation: Normal sinus rhythm Normal ECG When compared with ECG of 18-Jul-2023 11:48, PREVIOUS ECG IS PRESENT Confirmed by Court Carrier [id number]) on 01/17/2024 3:16:57 PM    ASSESSMENT AND PLAN:   PAD,severe calcified pop and tibial peroneal trunk disease bilateraly Long history of PAD status post diamondback orbital rotational atherectomy, PTA of the right SFA and stenting of her peroneal which I performed 05/16/2013.  Because of complex disease after restarting her 07/20/2013 I referred her to Dr. Zachary Clause who revascularized her 10/01/2013.  Dopplers performed 11/10/2016 revealed normal ABIs bilaterally with patent SFAs and infrapopliteal vessels.  She had Dopplers performed/19/21 that showed a right ABI of 0.39 and an occluded right popliteal artery, left ABI 1.12.  She really denies claudication.  Dyslipidemia, goal LDL  below 70 History of dyslipidemia on high-dose statin therapy with lipid profile performed 09/27/2023 revealing total cholesterol 220, LDL 154 and HDL 57.  Given her age, I do not feel compelled to change her to a PCSK9 at this time.  Essential hypertension History of essential hypertension with blood pressure measured today of 80/52.  She stated she checked her blood pressure this morning at home and it was 100 systolic.  She has been complaining of nausea vomiting diarrhea over the last month.  She is on carvedilol  and hydralazine .  I am going to discontinue the hydralazine  today and have her  keep a blood pressure log over the next 30 days at which time she will see an APP in follow-up.  Bilateral leg edema History of lateral lower extremity edema in the past, currently improved.  She is not on a diuretic.  CAD (coronary artery disease) History of CAD status post bypass grafting in 1998.  She did have a Myoview  stress test performed in 2021 which was low risk and nonischemic.  She denies chest pain.     Dorn DOROTHA Lesches MD FACP,FACC,FAHA, [facility] 01/17/2024 3:29 PM

## 2024-01-17 NOTE — Assessment & Plan Note (Signed)
 History of essential hypertension with blood pressure measured today of 80/52.  She stated she checked her blood pressure this morning at home and it was 100 systolic.  She has been complaining of nausea vomiting diarrhea over the last month.  She is on carvedilol  and hydralazine .  I am going to discontinue the hydralazine  today and have her keep a blood pressure log over the next 30 days at which time she will see an APP in follow-up.

## 2024-01-17 NOTE — Patient Instructions (Signed)
 Medication Instructions:  Your physician has recommended you make the following change in your medication:   -Stop taking hydralazine  (apresoline ).  *If you need a refill on your cardiac medications before your next appointment, please call your pharmacy*   Follow-Up: At [facility], you and your health needs are our priority.  As part of our continuing mission to provide you with exceptional heart care, we have created designated Provider Care Teams.  These Care Teams include your primary Cardiologist (physician) and Advanced Practice Providers (APPs -  Physician Assistants and Nurse Practitioners) who all work together to provide you with the care you need, when you need it.  We recommend signing up for the patient portal called MyChart.  Sign up information is provided on this After Visit Summary.  MyChart is used to connect with patients for Virtual Visits (Telemedicine).  Patients are able to view lab/test results, encounter notes, upcoming appointments, etc.  Non-urgent messages can be sent to your provider as well.   To learn more about what you can do with MyChart, go to [email or web address].    Your next appointment:   4 week(s)  Provider:   Callie Goodrich, PA-C, Kathleen Johnson, PA-C, Hao Meng, PA-C, Damien Braver, NP, or Katlyn West, NP       Then, Dorn Lesches, MD will plan to see you again in 6 month(s).   Other Instructions

## 2024-01-17 NOTE — Assessment & Plan Note (Signed)
History of dyslipidemia on high-dose statin therapy with lipid profile performed 09/27/2023 revealing total cholesterol 220, LDL 154 and HDL 57.  Given her age, I do not feel compelled to change her to a PCSK9 at this time.

## 2024-01-17 NOTE — Assessment & Plan Note (Signed)
History of lateral lower extremity edema in the past, currently improved.  She is not on a diuretic.

## 2024-01-17 NOTE — Assessment & Plan Note (Signed)
History of CAD status post bypass grafting in 1998.  She did have a Myoview stress test performed in 2021 which was low risk and nonischemic.  She denies chest pain.

## 2024-01-19 ENCOUNTER — Other Ambulatory Visit: Payer: Self-pay | Admitting: Cardiovascular Disease

## 2024-02-11 HISTORY — PX: TRANSTHORACIC ECHOCARDIOGRAM: SHX275

## 2024-02-15 ENCOUNTER — Ambulatory Visit: Payer: Medicare Other | Admitting: Cardiology

## 2024-02-23 ENCOUNTER — Encounter: Payer: Self-pay | Admitting: Gastroenterology

## 2024-02-25 NOTE — Progress Notes (Unsigned)
 Cardiology Office Note:  .   Date:  02/28/2024  ID:  [name], DOB 11/02/1933, MRN [id number] PCP: Irena Reichmann, DO  [facility] Providers Cardiologist:  Bryan Lemma, MD PV Cardiologist:  [name], MD    History of Present Illness: Marland Kitchen   MALESSA Flores is a 89 y.o. female with a past medial history of CAD, PAD, HLD, HTN, history of CVA, GERD. Patient is followed by Dr. Allyson Sabal and presents today for a follow up appointment   Patient previously had CABG in 1998. Later underwent left heart catheterization in 10/2016 that showed 55% stenosis in the origin of SVG-Diagonal, 55% stenosis in LM, 70% stenosis in ostial Ramus, patent SVG-OM, patent LIMA. Echocardiogram in 10/2016 showed EF 55-60%, no wall motion abnormalities, grade I DD. Her most recent ischemic evaluation was a nuclear stress test in 01/2022 that showed no evidence of ischemia or infarction. EF 61%. Her most recent echocardiogram from 04/14/23 showed EF 65-70%, no regional wall motion abnormalities, mild LVH, grade I DD, normal RV function, mildly elevated PA systolic pressure.   Patient also has a history of peripheral artery disease. In 05/2013, she underwent diamondback orbital rotational arthrectomy PTA for her distal right SFA, and DES to the peroneal artery. IN 07/2013, patient was referred to Dr. Pernell Dupre in [place] who performed intervention on her left popliteal and tibioperoneal trunk on 10/01/13. Followup dopplers in 04/2014 were excellent with ABIs of greater than 1 bilaterally, patent peroneal stent on the right, patent left popliteal. Her most recent dopplers from showed a right ABI of 0.39 and an occluded right popliteal artery, left ABI 1.12. She was without claudication, managed medically  Patient was last seen by Dr. Allyson Sabal on 01/17/24. At that time, she was without claudication. Her BP was low in the office and he hydralazine was stopped. Remained on carvedilol, statin therapy, plavix.   Today, patient presents for  a follow up appointment. Her main concerns are that she has been having dizziness and bilateral leg/foot numbness. The dizziness is described as random and not necessarily related to changes in position. Dizziness improves when she eats and drinks something. She denies syncope, near syncope. The patient also reports feeling unwell since January 16th. This is somewhat vague and she denies specific symptoms.  Overall, reports "just not feeling well". Her leg pain is described as an ache, and the patient reports a sensation of her feet feeling strange and numb, including between the toes.  Leg pain is presents both at rest and on exertion. She especially notices her leg discomfort and foot numbness when she is laying in bed at night. She has a known history of diabetes, does not know if she has diabetic neuropathy   ROS: Denies chest pain, shortness of breath, ankle swelling, syncope, near syncope, palpitations. Does have leg numbness, dizziness.   Studies Reviewed: .   Cardiac Studies & Procedures   ______________________________________________________________________________________________ CARDIAC CATHETERIZATION  CARDIAC CATHETERIZATION 11/01/2016  Narrative Images from the original result were not included.   LV end diastolic pressure is mildly elevated. In the setting of systemic hypertension  SVG-diagonal is moderate in size. Origin lesion, 55 %stenosed.  LM lesion, 55 %stenosed.  Ost LAD lesion, 100 %stenosed.  Prox LAD to Mid LAD lesion, 100 %stenosed. After D1.  Very small caliber Ost Ramus lesion, 70 %stenosed.  Ost Cx to Prox Cx lesion, 80 %stenosed. Prox Cx lesion, 70 %stenosed.  SVG-OM is large and anatomically normal. The flow in the graft is reversed.  The bifurcating Cx OM is relatively normal.  Very tortuous L Subclavian artery - unable to perform L Radial Cath.   Patient has relatively stable coronary disease with maybe mild progression of the vein graft to the  diagonal branch ostial disease that was maybe 40% previously. This does not appear to be enough flow limiting to cause resting symptoms, however most likely etiology is accelerated hypertension, accelerated LVEDP and microvascular disease with a small ramus branch and other branches from the native system.  PLAN:  Admit to step down for ongoing blood pressure control. Will use IV nitroglycerin overnight. I will increase carvedilol to 6.25 twice a day and add amlodipine.  Continue aspirin, Plavix and statin  Can stop IV heparin   Continue medical management otherwise. Will restart Imdur once her nitroglycerin is weaned off.    Bryan Lemma, M.D., M.S. Interventional Cardiologist  Pager # [phone] Phone # [phone] [address] [place], [place] [address]  Findings Coronary Findings Diagnostic  Dominance: Right  Left Main The lesion is located at the major branch and tubular.  Left Anterior Descending The lesion is chronically occluded. Just after a proximal septal perforator The lesion is chronically occluded.  First Septal Branch Vessel is small in size.  Second Diagonal Branch Vessel is small in size.  Second Septal Branch Vessel is small in size.  Third Diagonal Branch Vessel is small in size.  Third Septal Branch Vessel is small in size.  Ramus Intermedius Vessel is small. The lesion is smooth. Very small caliber ramus  Left Circumflex There is mild  diffuse disease throughout the vessel. The lesion is irregular. The lesion is calcified. Chronic The lesion is irregular. The lesion is calcified. Chronic  Second Obtuse Marginal Branch Vessel is small in size.  Right Coronary Artery Vessel is large. Vessel is angiographically normal.  Acute Marginal Branch Vessel is small in size.  Right Posterior Descending Artery Vessel is moderate in size.  Inferior Septal Vessel is small in size.  Right Posterior Atrioventricular  Artery Vessel is moderate in size. Vessel is angiographically normal.  First Right Posterolateral Branch Vessel is small in size. Vessel is angiographically normal.  Second Right Posterolateral Branch Vessel is moderate in size. Vessel is angiographically normal.  LIMA LIMA Graft To Dist LAD LIMA and is normal in caliber and anatomically normal.  Saphenous Graft To 1st Diag SVG and is moderate in size. The lesion is tubular and smooth.  Saphenous Graft To Lat 1st Mrg SVG and is large and anatomically normal. The flow in the graft is reversed.  Intervention  No interventions have been documented.   STRESS TESTS  MYOCARDIAL PERFUSION IMAGING 02/08/2022  Narrative   Normal blood pressure and normal heart rate response noted during stress. Heart rate recovery was normal.   No ST deviation was noted. There were no arrhythmias during recovery.   LV perfusion is normal. There is no evidence of ischemia. There is no evidence of infarction.   Left ventricular function is abnormal. Nuclear stress EF: 61 %. The left ventricular ejection fraction is normal (55-65%). End diastolic cavity size is normal. End systolic cavity size is normal. Evidence of transient ischemic dilation (TID) noted. TID was appreciated quantitatively but not visually.   The study is normal. Findings are consistent with no prior ischemia. The study is low risk.   ECHOCARDIOGRAM  ECHOCARDIOGRAM COMPLETE 04/14/2023  Narrative ECHOCARDIOGRAM REPORT    Patient Name:   [name]  Date of Exam: 04/14/2023 Medical Rec #:  [id number]     Height:       63.0 in Accession #:    [id number]    Weight:       156.0 lb Date of Birth:  [birth year]    BSA:          1.740 m Patient Age:    89 years      BP:           160/87 mmHg Patient Gender: F             HR:           71 bpm. Exam Location:  [address]  Procedure: 2D Echo, 3D Echo, Cardiac Doppler and Color Doppler  Indications:    I50.32 CHF  History:         Patient has prior history of Echocardiogram examinations, most recent 03/18/2020. CAD, PAD; Risk Factors:Hypertension and Dyslipidemia.  Sonographer:    Clearence Ped RCS Referring Phys: [phone] JONATHAN J BERRY  IMPRESSIONS   1. Left ventricular ejection fraction, by estimation, is 65 to 70%. The left ventricle has normal function. The left ventricle has no regional wall motion abnormalities. There is mild asymmetric left ventricular hypertrophy of the septal segment. Left ventricular diastolic parameters are consistent with Grade I diastolic dysfunction (impaired relaxation). 2. Right ventricular systolic function is normal. The right ventricular size is normal. There is mildly elevated pulmonary artery systolic pressure. 3. Left atrial size was mildly dilated. 4. The mitral valve is normal in structure. No evidence of mitral valve regurgitation. No evidence of mitral stenosis. 5. The aortic valve is tricuspid. Aortic valve regurgitation is not visualized. No aortic stenosis is present. 6. The inferior vena cava is normal in size with greater than 50% respiratory variability, suggesting right atrial pressure of 3 mmHg.  FINDINGS Left Ventricle: Left ventricular ejection fraction, by estimation, is 65 to 70%. The left ventricle has normal function. The left ventricle has no regional wall motion abnormalities. The left ventricular internal cavity size was normal in size. There is mild asymmetric left ventricular hypertrophy of the septal segment. Left ventricular diastolic parameters are consistent with Grade I diastolic dysfunction (impaired relaxation). Indeterminate filling pressures.  Right Ventricle: The right ventricular size is normal. No increase in right ventricular wall thickness. Right ventricular systolic function is normal. There is mildly elevated pulmonary artery systolic pressure. The tricuspid regurgitant velocity is 2.90 m/s, and with an assumed right atrial pressure of 3 mmHg,  the estimated right ventricular systolic pressure is 36.6 mmHg.  Left Atrium: Left atrial size was mildly dilated.  Right Atrium: Right atrial size was normal in size.  Pericardium: There is no evidence of pericardial effusion.  Mitral Valve: The mitral valve is normal in structure. No evidence of mitral valve regurgitation. No evidence of mitral valve stenosis.  Tricuspid Valve: The tricuspid valve is normal in structure. Tricuspid valve regurgitation is mild . No evidence of tricuspid stenosis.  Aortic Valve: The aortic valve is tricuspid. Aortic valve regurgitation is not visualized. No aortic stenosis is present.  Pulmonic Valve: The pulmonic valve was normal in structure. Pulmonic valve regurgitation is not visualized. No evidence of pulmonic stenosis.  Aorta: The aortic root is normal in size and structure.  Venous: The inferior vena cava is normal in size with greater than 50% respiratory variability, suggesting right atrial pressure of 3 mmHg.  IAS/Shunts: No atrial level shunt detected by color flow Doppler.   LEFT VENTRICLE PLAX 2D LVIDd:  3.50 cm   Diastology LVIDs:         2.20 cm   LV e' medial:    7.62 cm/s LV PW:         0.90 cm   LV E/e' medial:  11.1 LV IVS:        1.20 cm   LV e' lateral:   6.85 cm/s LVOT diam:     1.80 cm   LV E/e' lateral: 12.3 LV SV:         64 LV SV Index:   37 LVOT Area:     2.54 cm  3D Volume EF: 3D EF:        55 % LV EDV:       101 ml LV ESV:       45 ml LV SV:        56 ml  RIGHT VENTRICLE RV Basal diam:  3.00 cm RV S prime:     9.14 cm/s TAPSE (M-mode): 1.7 cm RVSP:           36.6 mmHg  LEFT ATRIUM             Index        RIGHT ATRIUM           Index LA diam:        3.80 cm 2.18 cm/m   RA Pressure: 3.00 mmHg LA Vol (A2C):   88.7 ml 50.98 ml/m  RA Area:     12.30 cm LA Vol (A4C):   25.9 ml 14.89 ml/m  RA Volume:   26.80 ml  15.40 ml/m LA Biplane Vol: 52.3 ml 30.06 ml/m AORTIC VALVE LVOT Vmax:   111.00  cm/s LVOT Vmean:  67.100 cm/s LVOT VTI:    0.250 m  AORTA Ao Root diam: 3.10 cm Ao Asc diam:  3.00 cm  MITRAL VALVE                TRICUSPID VALVE MV Area (PHT):              TR Peak grad:   33.6 mmHg MV Decel Time:              TR Vmax:        290.00 cm/s MV E velocity: 84.40 cm/s   Estimated RAP:  3.00 mmHg MV A velocity: 105.00 cm/s  RVSP:           36.6 mmHg MV E/A ratio:  0.80 SHUNTS Systemic VTI:  0.25 m Systemic Diam: 1.80 cm  Chilton Si MD Electronically signed by Chilton Si MD Signature Date/Time: 04/14/2023/1:28:00 PM    Final          ______________________________________________________________________________________________      Risk Assessment/Calculations:             Physical Exam:   VS:  BP 90/68   Pulse 93   Ht 5\' 3"  (1.6 m)   Wt 147 lb 3.2 oz (66.8 kg)   SpO2 96%   BMI 26.08 kg/m    Wt Readings from Last 3 Encounters:  02/28/24 147 lb 3.2 oz (66.8 kg)  01/17/24 148 lb 9.6 oz (67.4 kg)  07/18/23 154 lb 5.2 oz (70 kg)    GEN: Well nourished, well developed in no acute distress. Sitting upright in the chair  NECK: No JVD; No carotid bruits CARDIAC: RRR, no murmurs, rubs, gallops. Radial and dorsalis pedis pulses 2+ bilaterally  RESPIRATORY:  Clear to auscultation without rales, wheezing or rhonchi. Normal  WOB on room air  ABDOMEN: Soft, non-tender, non-distended EXTREMITIES:  No edema in BLE. Feet are warm, well perfused. No deformity   ASSESSMENT AND PLAN: .    HTN  Hypotension  Dizziness  - When seen by Dr. Allyson Sabal in 01/2024, her BP was low at 80/52. Hydralazine was held  - Patient reports that he BP has actually been elevated at home, but she checks her BP before taking any medications. BP in clinic today 90/68. Improved to 118/72 after she ate some crackers and drank water. Reports having episodes of dizziness at home that happen randomly  - Denies dizziness upon standing. Reports overall feeling a bit weak.  - Suspect  her BP is running low after her AM BP mediations. Instructed patient to check her BP about an hour after she takes her medications at home  - With her advanced age, would prefer to have her BP in the 120s-130s/80s.  - Decrease imdur to 30 mg daily  - Continue carvedilol 6.25 mg BID, - Instructed patient to increase her water intake. Ordered BMP, CBC for dizziness/weakness   CAD  - Patient previously had CABG in 1998. Most recent ischemic evaluation was a nuclear stress test in 01/2022 that was a normal, low risk study. No evidence of ischemia or infarction - Most recent echocardiogram from 04/2023 showed EF 65-70%, no regional wall motion abnormalities, mild LVH, grade I DD, normal RV function, no significant valvular abnormalities  - Patient has not needed to take and SL nitroglycerin for a long time. Had an episode of chest pain a few days ago that only lasted 1-2 seconds and resolved on its own. No chest pain otherwise  - Continue plavix 75 mg daily  - continue carvedilol 6.25 mg BID. Decrease imdur to 30 mg daily with low BP  - Continue crestor 20 mg daily, zetia 10 mg daily   PAD Leg pain - Previously had diamondback orbital rotational atherectomy, PTA of the right SFA and stenting of her peroneal in 05/2013. Later in 09/2013, underwent intervention on her left popliteal and tibioperoneal trunk  - Dopplers in 03/2020 showed right ABI of 0.39, occluded right popliteal artery, left ABI of 1.12 - Patient reports having bilateral lower extremity pain. Present at rest, but seems to worsen with exertion. Feet are warm and well perfused on exam, with 2+ dorsalis pedis pulses bilaterally. She also reports foot numbness at night when laying in bed. I am concerned she has diabetic neuropathy and instructed her to follow up with her PCP for diabetes management  - She requests lower extremity ultrasounds/ABIs. I agree this is reasonable with her medical history  - Ordered ABIs, lower extremity ultrasounds   - Encouraged her to increase her physical activity, specifically by walking or using foot pedals to increase lower extremity blood flow  - Continue plavix 75 mg daily, crestor 20 mg daily, zetia 10 mg daily   HLD  - Lipid panel from 09/2023 showed total cholesterol 220, LDL 154 and HDL 57  - Continue crestor 20 mg daily, zetia 10 mg daily    Dispo: Follow up in   Signed, Jonita Albee, PA-C

## 2024-02-28 ENCOUNTER — Ambulatory Visit: Payer: Medicare Other | Attending: Cardiology | Admitting: Cardiology

## 2024-02-28 ENCOUNTER — Encounter: Payer: Self-pay | Admitting: Cardiology

## 2024-02-28 VITALS — BP 90/68 | HR 93 | Ht 63.0 in | Wt 147.2 lb

## 2024-02-28 DIAGNOSIS — E785 Hyperlipidemia, unspecified: Secondary | ICD-10-CM | POA: Diagnosis not present

## 2024-02-28 DIAGNOSIS — I739 Peripheral vascular disease, unspecified: Secondary | ICD-10-CM | POA: Diagnosis not present

## 2024-02-28 DIAGNOSIS — I10 Essential (primary) hypertension: Secondary | ICD-10-CM

## 2024-02-28 DIAGNOSIS — I959 Hypotension, unspecified: Secondary | ICD-10-CM

## 2024-02-28 DIAGNOSIS — R42 Dizziness and giddiness: Secondary | ICD-10-CM

## 2024-02-28 DIAGNOSIS — I2510 Atherosclerotic heart disease of native coronary artery without angina pectoris: Secondary | ICD-10-CM

## 2024-02-28 MED ORDER — ISOSORBIDE MONONITRATE ER 30 MG PO TB24: 30.0000 mg | ORAL_TABLET | Freq: Every day | ORAL | 3 refills | Status: DC

## 2024-02-28 NOTE — Patient Instructions (Signed)
 Medication Instructions:  Decrease Imdur to 30 mg once a day *If you need a refill on your cardiac medications before your next appointment, please call your pharmacy*  Lab Work: Today we are going to draw a BNP and CBC If you have labs (blood work) drawn today and your tests are completely normal, you will receive your results only by: MyChart Message (if you have MyChart) OR A paper copy in the mail If you have any lab test that is abnormal or we need to change your treatment, we will call you to review the results.  Testing/Procedures: Your physician has requested that you have an ankle brachial index (ABI). During this test an ultrasound and blood pressure cuff are used to evaluate the arteries that supply the arms and legs with blood. Allow thirty minutes for this exam. There are no restrictions or special instructions. This will take place at [address] [facility], [address].   Please note: We ask at that you not bring children with you during ultrasound (echo/ vascular) testing. Due to room size and safety concerns, children are not allowed in the ultrasound rooms during exams. Our front office staff cannot provide observation of children in our lobby area while testing is being conducted. An adult accompanying a patient to their appointment will only be allowed in the ultrasound room at the discretion of the ultrasound technician under special circumstances. We apologize for any inconvenience.  Your physician has requested that you have a lower extremity arterial duplex. During this test, ultrasound is used to evaluate arterial blood flow in the legs. Allow one hour for this exam. There are no restrictions or special instructions. This will take place at [address] [facility], [address].  Please note: We ask at that you not bring children with you during ultrasound (echo/ vascular) testing. Due to room size and safety concerns, children are not allowed in the ultrasound rooms during exams. Our  front office staff cannot provide observation of children in our lobby area while testing is being conducted. An adult accompanying a patient to their appointment will only be allowed in the ultrasound room at the discretion of the ultrasound technician under special circumstances. We apologize for any inconvenience.   Follow-Up: At [facility], you and your health needs are our priority.  As part of our continuing mission to provide you with exceptional heart care, we have created designated Provider Care Teams.  These Care Teams include your primary Cardiologist (physician) and Advanced Practice Providers (APPs -  Physician Assistants and Nurse Practitioners) who all work together to provide you with the care you need, when you need it.  We recommend signing up for the patient portal called "MyChart".  Sign up information is provided on this After Visit Summary.  MyChart is used to connect with patients for Virtual Visits (Telemedicine).  Patients are able to view lab/test results, encounter notes, upcoming appointments, etc.  Non-urgent messages can be sent to your provider as well.   To learn more about what you can do with MyChart, go to [email or web address].    Your next appointment:   2 month(s)  Provider:   Robet Leu, PA-C   Other Instructions

## 2024-02-29 NOTE — Addendum Note (Signed)
 Addended by: Clotilde Dieter on: 02/29/2024 12:11 PM   Modules accepted: Orders

## 2024-03-03 LAB — CBC
Hematocrit: 41.2 % (ref 34.0–46.6)
Hemoglobin: 12.7 g/dL (ref 11.1–15.9)
MCH: 27.1 pg (ref 26.6–33.0)
MCHC: 30.8 g/dL — ABNORMAL LOW (ref 31.5–35.7)
MCV: 88 fL (ref 79–97)
Platelets: 249 10*3/uL (ref 150–450)
RBC: 4.68 x10E6/uL (ref 3.77–5.28)
RDW: 13.5 % (ref 11.7–15.4)
WBC: 7.5 10*3/uL (ref 3.4–10.8)

## 2024-03-03 LAB — BASIC METABOLIC PANEL
BUN/Creatinine Ratio: 20 (ref 12–28)
BUN: 18 mg/dL (ref 8–27)
CO2: 24 mmol/L (ref 20–29)
Calcium: 9.5 mg/dL (ref 8.7–10.3)
Chloride: 97 mmol/L (ref 96–106)
Creatinine, Ser: 0.92 mg/dL (ref 0.57–1.00)
Glucose: 223 mg/dL — ABNORMAL HIGH (ref 70–99)
Potassium: 4.4 mmol/L (ref 3.5–5.2)
Sodium: 137 mmol/L (ref 134–144)
eGFR: 60 mL/min/{1.73_m2} (ref >59)

## 2024-03-06 ENCOUNTER — Telehealth: Payer: Self-pay

## 2024-03-06 NOTE — Telephone Encounter (Signed)
 Called miss Spadoni on her labs work she understood the results Asked patient what their blood pressure has been like they reported that their blood pressure on 03/06/24 was 171/70 the day before it was in the 150's range sending this over to [employer]

## 2024-03-06 NOTE — Telephone Encounter (Signed)
-----   Message from Jonita Albee sent at 03/05/2024  1:50 PM EDT ----- Please tell patient that her lab work showed normal kidney function, normal electrolytes. WBC normal. Hemoglobin, RBC also normal. Overall, nothing in her labwork to suggest a cause of her dizziness.   Please ask what her BP has been looking like since we made changes   Thanks KJ

## 2024-03-08 NOTE — Telephone Encounter (Signed)
 Called patient advised of below they verbalized understanding.

## 2024-03-08 NOTE — Telephone Encounter (Signed)
 OK- please ask her to keep a BP log for the next 2 weeks. She should check her BP 1 hour after she takes her morning medications and again before she goes to sleep at night  Thanks KJ

## 2024-03-10 ENCOUNTER — Other Ambulatory Visit: Payer: Self-pay

## 2024-03-10 ENCOUNTER — Inpatient Hospital Stay (HOSPITAL_COMMUNITY)
Admission: EM | Admit: 2024-03-10 | Discharge: 2024-03-14 | DRG: 305 | Disposition: A | Discharge status: Home / Self Care | Attending: Family Medicine | Admitting: Family Medicine

## 2024-03-10 ENCOUNTER — Encounter (HOSPITAL_COMMUNITY): Payer: Self-pay | Admitting: *Deleted

## 2024-03-10 ENCOUNTER — Emergency Department (HOSPITAL_COMMUNITY)

## 2024-03-10 DIAGNOSIS — I130 Hypertensive heart and chronic kidney disease with heart failure and stage 1 through stage 4 chronic kidney disease, or unspecified chronic kidney disease: Secondary | ICD-10-CM | POA: Diagnosis present

## 2024-03-10 DIAGNOSIS — R0989 Other specified symptoms and signs involving the circulatory and respiratory systems: Secondary | ICD-10-CM | POA: Diagnosis present

## 2024-03-10 DIAGNOSIS — Z9861 Coronary angioplasty status: Secondary | ICD-10-CM

## 2024-03-10 DIAGNOSIS — K279 Peptic ulcer, site unspecified, unspecified as acute or chronic, without hemorrhage or perforation: Secondary | ICD-10-CM | POA: Diagnosis present

## 2024-03-10 DIAGNOSIS — D631 Anemia in chronic kidney disease: Secondary | ICD-10-CM | POA: Diagnosis present

## 2024-03-10 DIAGNOSIS — T380X5A Adverse effect of glucocorticoids and synthetic analogues, initial encounter: Secondary | ICD-10-CM | POA: Diagnosis not present

## 2024-03-10 DIAGNOSIS — I6523 Occlusion and stenosis of bilateral carotid arteries: Secondary | ICD-10-CM | POA: Diagnosis present

## 2024-03-10 DIAGNOSIS — K551 Chronic vascular disorders of intestine: Secondary | ICD-10-CM | POA: Diagnosis present

## 2024-03-10 DIAGNOSIS — Z8673 Personal history of transient ischemic attack (TIA), and cerebral infarction without residual deficits: Secondary | ICD-10-CM

## 2024-03-10 DIAGNOSIS — Z91041 Radiographic dye allergy status: Secondary | ICD-10-CM

## 2024-03-10 DIAGNOSIS — E1143 Type 2 diabetes mellitus with diabetic autonomic (poly)neuropathy: Secondary | ICD-10-CM | POA: Diagnosis present

## 2024-03-10 DIAGNOSIS — Z8349 Family history of other endocrine, nutritional and metabolic diseases: Secondary | ICD-10-CM

## 2024-03-10 DIAGNOSIS — Z7984 Long term (current) use of oral hypoglycemic drugs: Secondary | ICD-10-CM

## 2024-03-10 DIAGNOSIS — R55 Syncope and collapse: Secondary | ICD-10-CM | POA: Diagnosis not present

## 2024-03-10 DIAGNOSIS — I25119 Atherosclerotic heart disease of native coronary artery with unspecified angina pectoris: Secondary | ICD-10-CM | POA: Diagnosis present

## 2024-03-10 DIAGNOSIS — E119 Type 2 diabetes mellitus without complications: Secondary | ICD-10-CM

## 2024-03-10 DIAGNOSIS — Z7982 Long term (current) use of aspirin: Secondary | ICD-10-CM

## 2024-03-10 DIAGNOSIS — I771 Stricture of artery: Secondary | ICD-10-CM | POA: Diagnosis present

## 2024-03-10 DIAGNOSIS — I252 Old myocardial infarction: Secondary | ICD-10-CM

## 2024-03-10 DIAGNOSIS — Z8711 Personal history of peptic ulcer disease: Secondary | ICD-10-CM

## 2024-03-10 DIAGNOSIS — R54 Age-related physical debility: Secondary | ICD-10-CM | POA: Diagnosis present

## 2024-03-10 DIAGNOSIS — E1122 Type 2 diabetes mellitus with diabetic chronic kidney disease: Secondary | ICD-10-CM | POA: Diagnosis present

## 2024-03-10 DIAGNOSIS — Z602 Problems related to living alone: Secondary | ICD-10-CM | POA: Diagnosis present

## 2024-03-10 DIAGNOSIS — Z91013 Allergy to seafood: Secondary | ICD-10-CM

## 2024-03-10 DIAGNOSIS — Z79899 Other long term (current) drug therapy: Secondary | ICD-10-CM

## 2024-03-10 DIAGNOSIS — N1832 Chronic kidney disease, stage 3b: Secondary | ICD-10-CM | POA: Diagnosis present

## 2024-03-10 DIAGNOSIS — K2970 Gastritis, unspecified, without bleeding: Secondary | ICD-10-CM | POA: Diagnosis present

## 2024-03-10 DIAGNOSIS — Z9181 History of falling: Secondary | ICD-10-CM

## 2024-03-10 DIAGNOSIS — Z79631 Long term (current) use of antimetabolite agent: Secondary | ICD-10-CM

## 2024-03-10 DIAGNOSIS — Z7902 Long term (current) use of antithrombotics/antiplatelets: Secondary | ICD-10-CM

## 2024-03-10 DIAGNOSIS — I160 Hypertensive urgency: Secondary | ICD-10-CM | POA: Diagnosis not present

## 2024-03-10 DIAGNOSIS — E785 Hyperlipidemia, unspecified: Secondary | ICD-10-CM | POA: Diagnosis present

## 2024-03-10 DIAGNOSIS — E1142 Type 2 diabetes mellitus with diabetic polyneuropathy: Secondary | ICD-10-CM | POA: Diagnosis present

## 2024-03-10 DIAGNOSIS — Z79620 Long term (current) use of immunosuppressive biologic: Secondary | ICD-10-CM

## 2024-03-10 DIAGNOSIS — Z91040 Latex allergy status: Secondary | ICD-10-CM

## 2024-03-10 DIAGNOSIS — M069 Rheumatoid arthritis, unspecified: Secondary | ICD-10-CM | POA: Diagnosis present

## 2024-03-10 DIAGNOSIS — I5032 Chronic diastolic (congestive) heart failure: Secondary | ICD-10-CM | POA: Diagnosis present

## 2024-03-10 DIAGNOSIS — E1169 Type 2 diabetes mellitus with other specified complication: Secondary | ICD-10-CM | POA: Diagnosis present

## 2024-03-10 DIAGNOSIS — E1151 Type 2 diabetes mellitus with diabetic peripheral angiopathy without gangrene: Secondary | ICD-10-CM | POA: Diagnosis present

## 2024-03-10 DIAGNOSIS — R001 Bradycardia, unspecified: Secondary | ICD-10-CM | POA: Diagnosis not present

## 2024-03-10 DIAGNOSIS — Z888 Allergy status to other drugs, medicaments and biological substances status: Secondary | ICD-10-CM

## 2024-03-10 DIAGNOSIS — R079 Chest pain, unspecified: Principal | ICD-10-CM | POA: Diagnosis present

## 2024-03-10 DIAGNOSIS — E1165 Type 2 diabetes mellitus with hyperglycemia: Secondary | ICD-10-CM | POA: Diagnosis present

## 2024-03-10 DIAGNOSIS — I708 Atherosclerosis of other arteries: Secondary | ICD-10-CM | POA: Diagnosis present

## 2024-03-10 DIAGNOSIS — Z96643 Presence of artificial hip joint, bilateral: Secondary | ICD-10-CM | POA: Diagnosis present

## 2024-03-10 DIAGNOSIS — Z951 Presence of aortocoronary bypass graft: Secondary | ICD-10-CM

## 2024-03-10 DIAGNOSIS — Z8249 Family history of ischemic heart disease and other diseases of the circulatory system: Secondary | ICD-10-CM

## 2024-03-10 DIAGNOSIS — I2510 Atherosclerotic heart disease of native coronary artery without angina pectoris: Secondary | ICD-10-CM | POA: Diagnosis present

## 2024-03-10 DIAGNOSIS — Z7952 Long term (current) use of systemic steroids: Secondary | ICD-10-CM

## 2024-03-10 LAB — CBC
HCT: 41 % (ref 36.0–46.0)
Hemoglobin: 12.3 g/dL (ref 12.0–15.0)
MCH: 26.9 pg (ref 26.0–34.0)
MCHC: 30 g/dL (ref 30.0–36.0)
MCV: 89.5 fL (ref 80.0–100.0)
Platelets: 228 10*3/uL (ref 150–400)
RBC: 4.58 MIL/uL (ref 3.87–5.11)
RDW: 13.9 % (ref 11.5–15.5)
WBC: 7.1 10*3/uL (ref 4.0–10.5)
nRBC: 0 % (ref 0.0–0.2)

## 2024-03-10 LAB — BASIC METABOLIC PANEL WITH GFR
Anion gap: 9 (ref 5–15)
BUN: 16 mg/dL (ref 8–23)
CO2: 26 mmol/L (ref 22–32)
Calcium: 9.2 mg/dL (ref 8.9–10.3)
Chloride: 100 mmol/L (ref 98–111)
Creatinine, Ser: 1.19 mg/dL — ABNORMAL HIGH (ref 0.44–1.00)
GFR, Estimated: 44 mL/min — ABNORMAL LOW (ref >60)
Glucose, Bld: 262 mg/dL — ABNORMAL HIGH (ref 70–99)
Potassium: 4.6 mmol/L (ref 3.5–5.1)
Sodium: 135 mmol/L (ref 135–145)

## 2024-03-10 LAB — TROPONIN I (HIGH SENSITIVITY)
Troponin I (High Sensitivity): 8 ng/L (ref <18)
Troponin I (High Sensitivity): 8 ng/L (ref <18)

## 2024-03-10 NOTE — ED Triage Notes (Signed)
 Pt here via [facility] from home for weak/heavy chest pain, beginning this am.  Took 3 nitroglycerin in total today.  After 3rd nitroglycerin, began experiencing weakness, dizziness, back pain, nausea. Fire initially stated sbp in 70's.  [facility] spb in 90's/ and pt was pale and weak.  Given 4 mg zofran per [facility].  Given 200 ml ns that infiltrated.    160/84 Hr 50 - 70 Rr 15  Cbg 256

## 2024-03-10 NOTE — ED Provider Notes (Signed)
 [place] EMERGENCY DEPARTMENT AT [facility] Provider Note   CSN: [id number] Arrival date & time: 03/10/24  2044     History {Add pertinent medical, surgical, social history, OB history to HPI:1} Chief Complaint  Patient presents with   Chest Pain    [name] is a 89 y.o. female.  89 year old female with prior medical history as detailed below presents for evaluation.  Patient reports chest discomfort intermittently throughout the course of today.  She took several nitroglycerin at home with minimal improvement in her symptoms.  On arrival to the ED she reports that he feels much improved.  She denies any current active chest pain.  Patient's family member report that just prior to EMS arrival patient had near-syncopal/syncope symptoms.  Apparently the patient was minimally responsive for several minutes.    The history is provided by the patient.       Home Medications Prior to Admission medications   Medication Sig Start Date End Date Taking? Authorizing Provider  acetaminophen (TYLENOL) 650 MG CR tablet Take 650 mg by mouth daily as needed for pain.    [provider]  carvedilol (COREG) 6.25 MG tablet TAKE 1 TABLET BY MOUTH 2 TIMES DAILY WITH A MEAL. 01/19/24   Runell Gess, MD  Cholecalciferol (VITAMIN D3) 10 MCG (400 UNIT) tablet Take 400 Units by mouth daily.    [provider]  clopidogrel (PLAVIX) 75 MG tablet Take 75 mg by mouth at bedtime.    [provider]  denosumab (PROLIA) 60 MG/ML SOSY injection Inject 60 mg into the skin every 6 (six) months.    [provider]  ezetimibe (ZETIA) 10 MG tablet Take 10 mg by mouth at bedtime.    [provider]  folic acid (FOLVITE) 1 MG tablet Take 1 mg by mouth daily. 08/21/18   [provider]  gabapentin (NEURONTIN) 400 MG capsule Take 400 mg by mouth 2 (two) times daily. 09/07/22   [provider]  glimepiride (AMARYL) 2 MG tablet Take 2 mg by  mouth daily with breakfast. 01/07/22   [provider]  isosorbide mononitrate (IMDUR) 30 MG 24 hr tablet Take 1 tablet (30 mg total) by mouth daily. 02/28/24 05/28/24  Jonita Albee, PA-C  LORazepam (ATIVAN) 0.5 MG tablet 1 tablet Orally Once a day as needed for anxiety for 90 days 10/04/23   [provider]  Multiple Vitamin (MULTIVITAMIN) capsule Take 1 capsule by mouth daily.    [provider]  nitroGLYCERIN (NITROSTAT) 0.4 MG SL tablet PLACE 1 TABLET UNDER THE TONGUE EVERY 5 MINUTES AS NEEDED FOR CHEST PAIN. 12/30/23   Runell Gess, MD  ondansetron (ZOFRAN-ODT) 8 MG disintegrating tablet Take 8 mg by mouth. 01/11/24   [provider]  predniSONE (DELTASONE) 1 MG tablet Take 4 mg by mouth daily. 05/02/23   [provider]  rosuvastatin (CRESTOR) 20 MG tablet Take 20 mg by mouth daily. 11/11/23   [provider]      Allergies    Fish allergy, Iodinated contrast media, Latex, Shellfish allergy, Amlodipine, Evolocumab, Other, and Metformin    Review of Systems   Review of Systems  All other systems reviewed and are negative.   Physical Exam Updated Vital Signs BP (!) 155/68 (BP Location: Right Arm)   Pulse 65   Temp 98 F (36.7 C) (Oral)   Resp 18   Ht 5\' 3"  (1.6 m)   Wt 66.8 kg   SpO2 100%  BMI 26.07 kg/m  Physical Exam Vitals and nursing note reviewed.  Constitutional:      General: She is not in acute distress.    Appearance: Normal appearance. She is well-developed.  HENT:     Head: Normocephalic and atraumatic.  Eyes:     Conjunctiva/sclera: Conjunctivae normal.     Pupils: Pupils are equal, round, and reactive to light.  Cardiovascular:     Rate and Rhythm: Normal rate and regular rhythm.     Heart sounds: Normal heart sounds.  Pulmonary:     Effort: Pulmonary effort is normal. No respiratory distress.     Breath sounds: Normal breath sounds.  Abdominal:     General: There is no distension.      Palpations: Abdomen is soft.     Tenderness: There is no abdominal tenderness.  Musculoskeletal:        General: No deformity. Normal range of motion.     Cervical back: Normal range of motion and neck supple.  Skin:    General: Skin is warm and dry.  Neurological:     General: No focal deficit present.     Mental Status: She is alert and oriented to person, place, and time.     ED Results / Procedures / Treatments   Labs (all labs ordered are listed, but only abnormal results are displayed) Labs Reviewed  BASIC METABOLIC PANEL WITH GFR - Abnormal; Notable for the following components:      Result Value   Glucose, Bld 262 (*)    Creatinine, Ser 1.19 (*)    GFR, Estimated 44 (*)    All other components within normal limits  CBC  TROPONIN I (HIGH SENSITIVITY)  TROPONIN I (HIGH SENSITIVITY)    EKG EKG Interpretation Date/Time:  Saturday March 10 2024 21:12:59 EDT Ventricular Rate:  64 PR Interval:  206 QRS Duration:  84 QT Interval:  458 QTC Calculation: 473 R Axis:   64  Text Interpretation: Sinus rhythm Abnrm T, consider ischemia, anterolateral lds Confirmed by Kristine Royal [id number]) on 03/10/2024 9:25:45 PM  Radiology DG Chest 2 View Result Date: 03/10/2024 CLINICAL DATA:  Chest pain EXAM: CHEST - 2 VIEW COMPARISON:  11/19/2023 FINDINGS: Low lung volumes. Sternotomy and CABG. Aortic atherosclerotic calcification. Left basilar atelectasis or infiltrates. No pleural effusion or pneumothorax. No displaced rib fractures. Elevated right hemidiaphragm. IMPRESSION: Low lung volumes with left basilar atelectasis or infiltrates. Electronically Signed   By: Minerva Fester M.D.   On: 03/10/2024 21:42    Procedures Procedures  {Document cardiac monitor, telemetry assessment procedure when appropriate:1}  Medications Ordered in ED Medications - No data to display  ED Course/ Medical Decision Making/ A&P   {   Click here for ABCD2, HEART and other calculatorsREFRESH Note  before signing :1}                              Medical Decision Making Amount and/or Complexity of Data Reviewed Labs: ordered. Radiology: ordered.    Medical Screen Complete  This patient presented to the ED with complaint of chest pain   This complaint involves an extensive number of treatment options. The initial differential diagnosis includes, but is not limited to, ACS, angina, metabolic abnormality, etc.  This presentation is: Acute, Chronic, Self-Limited, Previously Undiagnosed, Uncertain Prognosis, Complicated, Systemic Symptoms, and Threat to Life/Bodily Function  Patient presents with complaint of nausea, chest discomfort.  Patient reports significant improvement in symptoms upon evaluation.  Initial EKG is without evidence of acute ischemia. Initial troponin is 8.  Blood glucose is moderately elevated to 62.  Creatinine is 1.19.    Co morbidities that complicated the patient's evaluation  See HPI   Additional history obtained:  External records from outside sources obtained and reviewed including prior ED visits and prior Inpatient records.   Problem List / ED Course:  Chest pain   Reevaluation:  After the interventions noted above, I reevaluated the patient and found that they have: improved   Disposition:  After consideration of the diagnostic results and the patients response to treatment, I feel that the patent would benefit from ***.    {Document critical care time when appropriate:1} {Document review of labs and clinical decision tools ie heart score, Chads2Vasc2 etc:1}  {Document your independent review of radiology images, and any outside records:1} {Document your discussion with family members, caretakers, and with consultants:1} {Document social determinants of health affecting pt's care:1} {Document your decision making why or why not admission, treatments were needed:1} Final Clinical Impression(s) / ED Diagnoses Final diagnoses:   None    Rx / DC Orders ED Discharge Orders     None

## 2024-03-10 NOTE — ED Notes (Signed)
 Patient transported to X-ray

## 2024-03-11 ENCOUNTER — Observation Stay (HOSPITAL_BASED_OUTPATIENT_CLINIC_OR_DEPARTMENT_OTHER)

## 2024-03-11 ENCOUNTER — Observation Stay (HOSPITAL_COMMUNITY)

## 2024-03-11 DIAGNOSIS — I739 Peripheral vascular disease, unspecified: Secondary | ICD-10-CM

## 2024-03-11 DIAGNOSIS — N183 Chronic kidney disease, stage 3 unspecified: Secondary | ICD-10-CM

## 2024-03-11 DIAGNOSIS — I160 Hypertensive urgency: Secondary | ICD-10-CM

## 2024-03-11 DIAGNOSIS — R0989 Other specified symptoms and signs involving the circulatory and respiratory systems: Secondary | ICD-10-CM | POA: Diagnosis present

## 2024-03-11 DIAGNOSIS — I2510 Atherosclerotic heart disease of native coronary artery without angina pectoris: Secondary | ICD-10-CM

## 2024-03-11 DIAGNOSIS — R079 Chest pain, unspecified: Secondary | ICD-10-CM

## 2024-03-11 DIAGNOSIS — R072 Precordial pain: Secondary | ICD-10-CM

## 2024-03-11 DIAGNOSIS — R55 Syncope and collapse: Secondary | ICD-10-CM

## 2024-03-11 DIAGNOSIS — E785 Hyperlipidemia, unspecified: Secondary | ICD-10-CM

## 2024-03-11 LAB — ECHOCARDIOGRAM COMPLETE
AR max vel: 2.55 cm2
AV Area VTI: 2.12 cm2
AV Area mean vel: 2.12 cm2
AV Mean grad: 5 mmHg
AV Peak grad: 6.7 mmHg
Ao pk vel: 1.29 m/s
Area-P 1/2: 1.86 cm2
Height: 63 in
S' Lateral: 3 cm
Weight: 2313.95 [oz_av]

## 2024-03-11 LAB — GLUCOSE, CAPILLARY
Glucose-Capillary: 125 mg/dL — ABNORMAL HIGH (ref 70–99)
Glucose-Capillary: 190 mg/dL — ABNORMAL HIGH (ref 70–99)
Glucose-Capillary: 217 mg/dL — ABNORMAL HIGH (ref 70–99)
Glucose-Capillary: 315 mg/dL — ABNORMAL HIGH (ref 70–99)

## 2024-03-11 LAB — BASIC METABOLIC PANEL WITH GFR
Anion gap: 8 (ref 5–15)
BUN: 19 mg/dL (ref 8–23)
CO2: 26 mmol/L (ref 22–32)
Calcium: 9.1 mg/dL (ref 8.9–10.3)
Chloride: 102 mmol/L (ref 98–111)
Creatinine, Ser: 1.16 mg/dL — ABNORMAL HIGH (ref 0.44–1.00)
GFR, Estimated: 45 mL/min — ABNORMAL LOW (ref >60)
Glucose, Bld: 218 mg/dL — ABNORMAL HIGH (ref 70–99)
Potassium: 4 mmol/L (ref 3.5–5.1)
Sodium: 136 mmol/L (ref 135–145)

## 2024-03-11 LAB — CBC
HCT: 34.7 % — ABNORMAL LOW (ref 36.0–46.0)
Hemoglobin: 10.7 g/dL — ABNORMAL LOW (ref 12.0–15.0)
MCH: 27.4 pg (ref 26.0–34.0)
MCHC: 30.8 g/dL (ref 30.0–36.0)
MCV: 89 fL (ref 80.0–100.0)
Platelets: 205 10*3/uL (ref 150–400)
RBC: 3.9 MIL/uL (ref 3.87–5.11)
RDW: 14.1 % (ref 11.5–15.5)
WBC: 4.6 10*3/uL (ref 4.0–10.5)
nRBC: 0 % (ref 0.0–0.2)

## 2024-03-11 LAB — CORTISOL: Cortisol, Plasma: 10.4 ug/dL

## 2024-03-11 LAB — LIPID PANEL
Cholesterol: 190 mg/dL (ref 0–200)
HDL: 46 mg/dL (ref >40)
LDL Cholesterol: 129 mg/dL — ABNORMAL HIGH (ref 0–99)
Total CHOL/HDL Ratio: 4.1 ratio
Triglycerides: 77 mg/dL (ref <150)
VLDL: 15 mg/dL (ref 0–40)

## 2024-03-11 LAB — HEMOGLOBIN A1C
Hgb A1c MFr Bld: 8.8 % — ABNORMAL HIGH (ref 4.8–5.6)
Mean Plasma Glucose: 205.86 mg/dL

## 2024-03-11 LAB — PHOSPHORUS: Phosphorus: 5.2 mg/dL — ABNORMAL HIGH (ref 2.5–4.6)

## 2024-03-11 LAB — MAGNESIUM: Magnesium: 2.1 mg/dL (ref 1.7–2.4)

## 2024-03-11 LAB — MRSA NEXT GEN BY PCR, NASAL: MRSA by PCR Next Gen: NOT DETECTED

## 2024-03-11 MED ORDER — INSULIN ASPART 100 UNIT/ML IJ SOLN
0.0000 [IU] | INTRAMUSCULAR | Status: DC
  Administered 2024-03-11: 7 [IU] via SUBCUTANEOUS
  Administered 2024-03-12: 2 [IU] via SUBCUTANEOUS
  Administered 2024-03-12: 5 [IU] via SUBCUTANEOUS
  Administered 2024-03-12 (×2): 3 [IU] via SUBCUTANEOUS
  Administered 2024-03-12 – 2024-03-13 (×2): 2 [IU] via SUBCUTANEOUS
  Administered 2024-03-13 – 2024-03-14 (×3): 1 [IU] via SUBCUTANEOUS
  Administered 2024-03-14: 2 [IU] via SUBCUTANEOUS
  Administered 2024-03-14: 1 [IU] via SUBCUTANEOUS

## 2024-03-11 MED ORDER — INSULIN ASPART 100 UNIT/ML IJ SOLN: 0.0000 [IU] | Freq: Four times a day (QID) | INTRAMUSCULAR | Status: DC

## 2024-03-11 MED ORDER — LORAZEPAM 2 MG/ML IJ SOLN
0.5000 mg | Freq: Once | INTRAMUSCULAR | Status: DC
  Filled 2024-03-11: qty 1

## 2024-03-11 MED ORDER — ACETAMINOPHEN 500 MG PO TABS
1000.0000 mg | ORAL_TABLET | Freq: Four times a day (QID) | ORAL | Status: DC | PRN
  Administered 2024-03-11 – 2024-03-13 (×6): 1000 mg via ORAL
  Filled 2024-03-11 (×6): qty 2

## 2024-03-11 MED ORDER — METHYLPREDNISOLONE SODIUM SUCC 40 MG IJ SOLR
40.0000 mg | Freq: Once | INTRAMUSCULAR | Status: AC
  Administered 2024-03-11: 40 mg via INTRAVENOUS
  Filled 2024-03-11: qty 1

## 2024-03-11 MED ORDER — INSULIN ASPART 100 UNIT/ML IJ SOLN
0.0000 [IU] | Freq: Three times a day (TID) | INTRAMUSCULAR | Status: DC
  Administered 2024-03-11: 2 [IU] via SUBCUTANEOUS
  Administered 2024-03-11: 1 [IU] via SUBCUTANEOUS

## 2024-03-11 MED ORDER — ALUM & MAG HYDROXIDE-SIMETH 200-200-20 MG/5ML PO SUSP: 30.0000 mL | Freq: Once | ORAL | Status: DC

## 2024-03-11 MED ORDER — HYDRALAZINE HCL 20 MG/ML IJ SOLN
10.0000 mg | Freq: Four times a day (QID) | INTRAMUSCULAR | Status: DC | PRN
  Administered 2024-03-11: 10 mg via INTRAVENOUS
  Filled 2024-03-11 (×2): qty 1

## 2024-03-11 MED ORDER — CARVEDILOL 3.125 MG PO TABS
3.1250 mg | ORAL_TABLET | Freq: Two times a day (BID) | ORAL | Status: DC
  Administered 2024-03-11 – 2024-03-14 (×6): 3.125 mg via ORAL
  Filled 2024-03-11 (×6): qty 1

## 2024-03-11 MED ORDER — PANTOPRAZOLE SODIUM 40 MG PO TBEC
40.0000 mg | DELAYED_RELEASE_TABLET | Freq: Every day | ORAL | Status: DC
  Administered 2024-03-11 – 2024-03-14 (×4): 40 mg via ORAL
  Filled 2024-03-11 (×4): qty 1

## 2024-03-11 MED ORDER — ASPIRIN 81 MG PO CHEW
324.0000 mg | CHEWABLE_TABLET | Freq: Once | ORAL | Status: AC
  Administered 2024-03-11: 324 mg via ORAL
  Filled 2024-03-11: qty 4

## 2024-03-11 MED ORDER — GABAPENTIN 400 MG PO CAPS
400.0000 mg | ORAL_CAPSULE | Freq: Two times a day (BID) | ORAL | Status: DC
  Administered 2024-03-11 – 2024-03-14 (×8): 400 mg via ORAL
  Filled 2024-03-11 (×8): qty 1

## 2024-03-11 MED ORDER — ROSUVASTATIN CALCIUM 20 MG PO TABS
20.0000 mg | ORAL_TABLET | Freq: Every day | ORAL | Status: DC
  Administered 2024-03-11 – 2024-03-14 (×4): 20 mg via ORAL
  Filled 2024-03-11 (×4): qty 1

## 2024-03-11 MED ORDER — SODIUM CHLORIDE 0.9% FLUSH
3.0000 mL | Freq: Two times a day (BID) | INTRAVENOUS | Status: DC
  Administered 2024-03-11 – 2024-03-14 (×8): 3 mL via INTRAVENOUS

## 2024-03-11 MED ORDER — HYDRALAZINE HCL 20 MG/ML IJ SOLN: 20.0000 mg | Freq: Four times a day (QID) | INTRAMUSCULAR | Status: DC | PRN

## 2024-03-11 MED ORDER — DIPHENHYDRAMINE HCL 25 MG PO CAPS
50.0000 mg | ORAL_CAPSULE | Freq: Once | ORAL | Status: AC
  Administered 2024-03-11: 50 mg via ORAL
  Filled 2024-03-11: qty 2

## 2024-03-11 MED ORDER — CARVEDILOL 6.25 MG PO TABS
6.2500 mg | ORAL_TABLET | Freq: Two times a day (BID) | ORAL | Status: DC
  Filled 2024-03-11: qty 1

## 2024-03-11 MED ORDER — DIPHENHYDRAMINE HCL 50 MG/ML IJ SOLN: 50.0000 mg | Freq: Once | INTRAMUSCULAR | Status: AC

## 2024-03-11 MED ORDER — PREDNISONE 1 MG PO TABS
4.0000 mg | ORAL_TABLET | Freq: Every day | ORAL | Status: DC
  Administered 2024-03-11: 4 mg via ORAL
  Filled 2024-03-11: qty 4

## 2024-03-11 MED ORDER — LORAZEPAM 0.5 MG PO TABS
0.5000 mg | ORAL_TABLET | Freq: Every evening | ORAL | Status: DC | PRN
  Administered 2024-03-11 – 2024-03-12 (×3): 0.5 mg via ORAL
  Filled 2024-03-11 (×3): qty 1

## 2024-03-11 MED ORDER — PREDNISONE 20 MG PO TABS: 50.0000 mg | ORAL_TABLET | Freq: Four times a day (QID) | ORAL | Status: DC

## 2024-03-11 MED ORDER — ADULT MULTIVITAMIN W/MINERALS CH
1.0000 | ORAL_TABLET | Freq: Every day | ORAL | Status: DC
  Administered 2024-03-11 – 2024-03-14 (×4): 1 via ORAL
  Filled 2024-03-11 (×4): qty 1

## 2024-03-11 MED ORDER — MELATONIN 3 MG PO TABS
6.0000 mg | ORAL_TABLET | Freq: Every evening | ORAL | Status: DC | PRN
  Administered 2024-03-11 – 2024-03-12 (×3): 6 mg via ORAL
  Filled 2024-03-11 (×3): qty 2

## 2024-03-11 MED ORDER — DIPHENHYDRAMINE HCL 50 MG/ML IJ SOLN: 50.0000 mg | Freq: Once | INTRAMUSCULAR | Status: DC

## 2024-03-11 MED ORDER — NITROGLYCERIN 2 % TD OINT
0.2500 [in_us] | TOPICAL_OINTMENT | Freq: Four times a day (QID) | TRANSDERMAL | Status: DC | PRN
  Administered 2024-03-11: 0.25 [in_us] via TOPICAL
  Filled 2024-03-11: qty 1

## 2024-03-11 MED ORDER — CLOPIDOGREL BISULFATE 75 MG PO TABS
75.0000 mg | ORAL_TABLET | Freq: Every day | ORAL | Status: DC
  Administered 2024-03-11 – 2024-03-13 (×4): 75 mg via ORAL
  Filled 2024-03-11 (×4): qty 1

## 2024-03-11 MED ORDER — ISOSORBIDE MONONITRATE ER 30 MG PO TB24
30.0000 mg | ORAL_TABLET | Freq: Every day | ORAL | Status: DC
  Administered 2024-03-11 – 2024-03-12 (×2): 30 mg via ORAL
  Filled 2024-03-11 (×3): qty 1

## 2024-03-11 MED ORDER — ONDANSETRON HCL 4 MG/2ML IJ SOLN
4.0000 mg | Freq: Four times a day (QID) | INTRAMUSCULAR | Status: DC | PRN
  Administered 2024-03-11: 4 mg via INTRAVENOUS
  Filled 2024-03-11: qty 2

## 2024-03-11 MED ORDER — DIPHENHYDRAMINE HCL 25 MG PO CAPS: 50.0000 mg | ORAL_CAPSULE | Freq: Once | ORAL | Status: DC

## 2024-03-11 MED ORDER — POLYETHYLENE GLYCOL 3350 17 G PO PACK: 17.0000 g | PACK | Freq: Every day | ORAL | Status: DC | PRN

## 2024-03-11 MED ORDER — FOLIC ACID 1 MG PO TABS
1.0000 mg | ORAL_TABLET | Freq: Every day | ORAL | Status: DC
  Administered 2024-03-11 – 2024-03-14 (×4): 1 mg via ORAL
  Filled 2024-03-11 (×4): qty 1

## 2024-03-11 MED ORDER — HYDRALAZINE HCL 20 MG/ML IJ SOLN
5.0000 mg | Freq: Once | INTRAMUSCULAR | Status: DC
  Filled 2024-03-11: qty 1

## 2024-03-11 MED ORDER — EZETIMIBE 10 MG PO TABS
10.0000 mg | ORAL_TABLET | Freq: Every day | ORAL | Status: DC
  Administered 2024-03-11 – 2024-03-13 (×4): 10 mg via ORAL
  Filled 2024-03-11 (×4): qty 1

## 2024-03-11 MED ORDER — HYDRALAZINE HCL 10 MG PO TABS
10.0000 mg | ORAL_TABLET | Freq: Three times a day (TID) | ORAL | Status: DC
  Administered 2024-03-11 – 2024-03-12 (×3): 10 mg via ORAL
  Filled 2024-03-11 (×3): qty 1

## 2024-03-11 NOTE — Care Management Obs Status (Signed)
 MEDICARE OBSERVATION STATUS NOTIFICATION   Patient Details  Name: [name] MRN: [id number] Date of Birth: 03/30/1933   Medicare Observation Status Notification Given:  Yes    Ronny Bacon, RN 03/11/2024, 4:17 PM

## 2024-03-11 NOTE — Plan of Care (Incomplete)
 Cross Cover Note   CT Angiography ordered overnight to r/o aortic dissection. ***   Achille Rich, MD Cardiology

## 2024-03-11 NOTE — Progress Notes (Signed)
 PROGRESS NOTE    Patient: [name]                            PCP: Irena Reichmann, DO                    DOB: September 13, 1933            DOA: 03/10/2024 EPP:[id number]             DOS: 03/11/2024, 1:36 PM   LOS: 0 days   Date of Service: The patient was seen and examined on 03/11/2024  Subjective:   The patient was seen and examined this morning. Labile BP, as high as 234/87, currently 133/81 Heart rate range 49-99 currently 60, RR 15, 97% on room air Denies any chest pain this morning. No issues overnight .  Brief Narrative:    KERSTON LANDECK is a 89 y.o. female with hx of CAD, CABG, PAD, HFpEF, hypertension, hyperlipidemia, diabetes, peptic ulcer disease, RA, who presented due to chest pain.    Reports that recently she has been bothered by dizziness and nausea which is being evaluated by primary care.  She was hypotensive in clinic and her hydralazine was discontinued.  Recently also seen cardiology and noted to be hypotensive and her isosorbide mononitrate was reduced from 60 to 30 mg daily.   Yesterday in the morning she noted that she had severe range hypertension with systolic up to the 240s.  Although her hydralazine had recently been discontinued she took a dose to help reduce her severe range hypertension.  She had noted chest pressure throughout the day which was relatively constant.  Around 7 PM she developed chest pain in the lower central chest which radiated towards her back.  Due to the onset of worsening chest pain she took a tablet of nitroglycerin and repeated this twice.    Family called EMS.  Around this time she had what sounds like a syncopal episode and was briefly unresponsive.  Per report had systolic BP in the 70s prior to EMS arrival.  At time of interview her chest pain has resolved.  She is able to do light housework and denies any exertional symptoms with this amount of activity.  No dyspnea on exertion.  Is having lightheadedness and dizziness intermittently as  well as nausea.  No vomiting or diarrhea.  No other changes in medications other than mentioned above.    Assessment & Plan:   Principal Problem:   Chest pain Active Problems:   CAD (coronary artery disease)   Diabetes mellitus (HCC)   Non-insulin dependent type 2 diabetes mellitus (HCC)   Near syncope   Hx of CABG   Chronic diastolic CHF (congestive heart failure) (HCC)   Polyneuropathy due to type 2 diabetes mellitus (HCC)   Peptic ulcer disease   Type 2 diabetes mellitus with other specified complication (HCC)   Labile hypertension   Assessment/Plan:   Chest pain, atypical -Patient is at high risk, with extensive history of CAD, CABG, HFpEF and comorbidities -Stable angina -may be related to episodic accelerated hypertension - not relieved by nitroglycerin at home  -Troponin x 2 negative -Monitoring vitals -No significant changes on EKG-T wave inversion, -Cardiology consulted -appreciate evaluation, pending echo also recommending CT angiogram -Monitor on telemetry closely -As needed nitroglycerin, -Follow-up labs, including LDL, A1c  -Cardiology reduced dose of Coreg, continue Seroquel, added hydralazine  Gastritis/esophagitis in setting of medications including chronic steroid,  methotrexate; less likely pneumonia/pleurisy and absence of suggestive symptoms although chest x-ray with possible left base infiltrate.     - Routine cardiology consult in the morning, not contacted overnight  - Load with aspirin 325 mg x 1, otherwise continue Plavix 75 mg daily for now  - Start pantoprazole for empiric treatment of gastritis, GI cocktail as needed  Labile hypertension/ Hypertensive urgency / Recent hypotension Labile blood pressure -With episodes of hypotension -hypertension with BP systolic/234 -Etiology unknown -Per cardiology recommendation continue Coreg at lowest dose 3.125 mg p.o. twice daily, hydralazine 10 mg p.o. every 8 hours, Imdur 30 mg p.o. daily  Syncope  versus presyncope episode at home -Likely related to vasovagal, labile blood pressure -Will monitor on telemetry -Will follow-up with: 2D echocardiogram, cardiac enzymes, CT angiogram     -May benefit from ambulatory blood pressure monitoring   Chronic medical problems:  CAD, CABG/PAD/HLD: Continue home Plavix, rosuvastatin. HFpEF: Without acute exacerbation, not on diuretics at home. Diabetes type 2: Hold home glimepiride. SSI for very sensitive while inpatient. Peptic ulcer disease: See above starting on pantoprazole for empiric treatment of gastritis. Rheumatoid arthritis: On methotrexate weekly on Tuesdays.  Continue home prednisone and 4 mg daily.  Continue home folate   Body mass index is 26.07 kg/m.     ----------------------------------------------------------------------------------------------------------------------------------------------- Nutritional status:  The patient's BMI is: Body mass index is 25.62 kg/m. I agree with the assessment and plan as outlined -----------------------------------------------------------------------------------------------------------------------------------------  DVT prophylaxis:  SCDs Start: 03/11/24 0048   Code Status:   Code Status: Full Code  Family Communication: No family member present at bedside- -Advance care planning has been discussed.   Admission status:   Status is: Observation The patient remains OBS appropriate and will d/c before 2 midnights.   Disposition: From  - home             Planning for discharge in 1 days: to Home   Procedures:   No admission procedures for hospital encounter.   Antimicrobials:  Anti-infectives (From admission, onward)    None        Medication:   alum & mag hydroxide-simeth  30 mL Oral Once   carvedilol  3.125 mg Oral BID WC   clopidogrel  75 mg Oral QHS   diphenhydrAMINE  50 mg Oral Once   Or   diphenhydrAMINE  50 mg Intravenous Once   ezetimibe  10 mg Oral QHS    folic acid  1 mg Oral Daily   gabapentin  400 mg Oral BID   hydrALAZINE  10 mg Oral Q8H   insulin aspart  0-6 Units Subcutaneous TID WC   isosorbide mononitrate  30 mg Oral Daily   multivitamin with minerals  1 tablet Oral Daily   pantoprazole  40 mg Oral Daily   rosuvastatin  20 mg Oral Daily   sodium chloride flush  3 mL Intravenous Q12H    acetaminophen, hydrALAZINE, LORazepam, melatonin, ondansetron (ZOFRAN) IV, polyethylene glycol   Objective:   Vitals:   03/11/24 0345 03/11/24 0349 03/11/24 0732 03/11/24 1155  BP:  132/62 (!) 141/60 133/81  Pulse:  (!) 55 60   Resp:   16 15  Temp:   (!) 97.3 F (36.3 C) 97.7 F (36.5 C)  TempSrc:   Oral Oral  SpO2:  98% 97%   Weight: 65.6 kg     Height: 5\' 3"  (1.6 m)       Intake/Output Summary (Last 24 hours) at 03/11/2024 1336 Last data filed at 03/11/2024 1228  Gross per 24 hour  Intake 120 ml  Output 350 ml  Net -230 ml   Filed Weights   03/10/24 2056 03/11/24 0255 03/11/24 0345  Weight: 66.8 kg 65.6 kg 65.6 kg     Physical examination:   Constitution:  Alert, cooperative, no distress,  Appears calm and comfortable  Psychiatric:   Normal and stable mood and affect, cognition intact,   HEENT:        Normocephalic, PERRL, otherwise with in Normal limits  Chest:         Chest symmetric Cardio vascular:  S1/S2, RRR, No murmure, No Rubs or Gallops  pulmonary: Clear to auscultation bilaterally, respirations unlabored, negative wheezes / crackles Abdomen: Soft, non-tender, non-distended, bowel sounds,no masses, no organomegaly Muscular skeletal: Limited exam - in bed, able to move all 4 extremities,   Neuro: CNII-XII intact. , normal motor and sensation, reflexes intact  Extremities: No pitting edema lower extremities, +2 pulses  Skin: Dry, warm to touch, negative for any Rashes, No open wounds Wounds: per nursing  documentation   ------------------------------------------------------------------------------------------------------------------------------------------    LABs:     Latest Ref Rng & Units 03/11/2024    8:42 AM 03/10/2024    9:00 PM 03/02/2024    4:04 PM  CBC  WBC 4.0 - 10.5 K/uL 4.6  7.1  7.5   Hemoglobin 12.0 - 15.0 g/dL 16.1  09.6  04.5   Hematocrit 36.0 - 46.0 % 34.7  41.0  41.2   Platelets 150 - 400 K/uL 205  228  249       Latest Ref Rng & Units 03/11/2024    8:42 AM 03/10/2024    9:00 PM 03/02/2024    4:04 PM  CMP  Glucose 70 - 99 mg/dL 409  811  914   BUN 8 - 23 mg/dL 19  16  18    Creatinine 0.44 - 1.00 mg/dL 7.82  9.56  2.13   Sodium 135 - 145 mmol/L 136  135  137   Potassium 3.5 - 5.1 mmol/L 4.0  4.6  4.4   Chloride 98 - 111 mmol/L 102  100  97   CO2 22 - 32 mmol/L 26  26  24    Calcium 8.9 - 10.3 mg/dL 9.1  9.2  9.5        Micro Results Recent Results (from the past 240 hours)  MRSA Next Gen by PCR, Nasal     Status: None   Collection Time: 03/10/24  9:16 PM   Specimen: Nasal Mucosa; Nasal Swab  Result Value Ref Range Status   MRSA by PCR Next Gen NOT DETECTED NOT DETECTED Final    Comment: (NOTE) The GeneXpert MRSA Assay (FDA approved for NASAL specimens only), is one component of a comprehensive MRSA colonization surveillance program. It is not intended to diagnose MRSA infection nor to guide or monitor treatment for MRSA infections. Test performance is not FDA approved in patients less than 86 years old. Performed at [facility], [address] [place], [place] [address]     Radiology Reports DG Chest 2 View Result Date: 03/10/2024 CLINICAL DATA:  Chest pain EXAM: CHEST - 2 VIEW COMPARISON:  11/19/2023 FINDINGS: Low lung volumes. Sternotomy and CABG. Aortic atherosclerotic calcification. Left basilar atelectasis or infiltrates. No pleural effusion or pneumothorax. No displaced rib fractures. Elevated right hemidiaphragm. IMPRESSION: Low lung  volumes with left basilar atelectasis or infiltrates. Electronically Signed   By: Minerva Fester M.D.   On: 03/10/2024 21:42  SIGNED: Kendell Bane, MD, FHM. FAAFP. [facility] - [facility] Time spent - 55 min.  In seeing, evaluating and examining the patient. Reviewing medical records, labs, drawn plan of care. [facility],  Pager (please use [email or web address] to page/ text) Please use Epic Secure Chat for non-urgent communication (7AM-7PM)  If 7PM-7AM, please contact night-coverage [email or web address], 03/11/2024, 1:36 PM

## 2024-03-11 NOTE — H&P (Signed)
 History and Physical    EMMALYNNE COURTNEY Flores:[id number] DOB: [birth year] DOA: 03/10/2024  PCP: Irena Reichmann, DO   Patient coming from: Home   Chief Complaint:  Chief Complaint  Patient presents with   Chest Pain    HPI:  [name] is a 89 y.o. female with hx of CAD, CABG, PAD, HFpEF, hypertension, hyperlipidemia, diabetes, peptic ulcer disease, RA, who presented due to chest pain.   Reports that recently she has been bothered by dizziness and nausea which is being evaluated by primary care.  She was hypotensive in clinic and her hydralazine was discontinued.  Recently also seen cardiology and noted to be hypotensive and her isosorbide mononitrate was reduced from 60 to 30 mg daily.  Yesterday in the morning she noted that she had severe range hypertension with systolic up to the 240s.  Although her hydralazine had recently been discontinued she took a dose to help reduce her severe range hypertension.  She had noted chest pressure throughout the day which was relatively constant.  Around 7 PM she developed chest pain in the lower central chest which radiated towards her back.  Due to the onset of worsening chest pain she took a tablet of nitroglycerin and repeated this twice.  Family called EMS.  Around this time she had what sounds like a syncopal episode and was briefly unresponsive.  Per report had systolic BP in the 70s prior to EMS arrival.  At time of interview her chest pain has resolved.  She is able to do light housework and denies any exertional symptoms with this amount of activity.  No dyspnea on exertion.  Is having lightheadedness and dizziness intermittently as well as nausea.  No vomiting or diarrhea.  No other changes in medications other than mentioned above.  Review of Systems:  ROS complete and negative except as marked above   Allergies  Allergen Reactions   Fish Allergy Hives and Shortness Of Breath   Iodinated Contrast Media Shortness Of Breath and Anaphylaxis     Other reaction(s): Unknown   Latex Anaphylaxis, Hives, Shortness Of Breath, Itching and Other (See Comments)    REACTION: wheezing Other reaction(s): Unknown   Shellfish Allergy Anaphylaxis, Hives, Shortness Of Breath and Other (See Comments)    All seafood   Amlodipine Swelling   Evolocumab Other (See Comments)    Other reaction(s): latex on syringe Unknown reaction   Other Itching and Other (See Comments)    Patient reports allergy to perfumed detergents   Metformin Nausea And Vomiting    Prior to Admission medications   Medication Sig Start Date End Date Taking? Authorizing Provider  acetaminophen (TYLENOL) 650 MG CR tablet Take 650 mg by mouth daily as needed for pain.   Yes [provider]  carvedilol (COREG) 6.25 MG tablet TAKE 1 TABLET BY MOUTH 2 TIMES DAILY WITH A MEAL. 01/19/24  Yes Runell Gess, MD  Cholecalciferol (VITAMIN D3) 10 MCG (400 UNIT) tablet Take 400 Units by mouth daily.   Yes [provider]  clopidogrel (PLAVIX) 75 MG tablet Take 75 mg by mouth at bedtime.   Yes [provider]  denosumab (PROLIA) 60 MG/ML SOSY injection Inject 60 mg into the skin every 6 (six) months.   Yes [provider]  ezetimibe (ZETIA) 10 MG tablet Take 10 mg by mouth at bedtime.   Yes [provider]  folic acid (FOLVITE) 1 MG tablet Take 1 mg by mouth daily. 08/21/18  Yes [provider]  gabapentin (NEURONTIN) 400 MG capsule Take 400 mg by mouth 2 (two) times daily. 09/07/22  Yes [provider]  glimepiride (AMARYL) 2 MG tablet Take 2 mg by mouth daily with breakfast. 01/07/22  Yes [provider]  isosorbide mononitrate (IMDUR) 30 MG 24 hr tablet Take 1 tablet (30 mg total) by mouth daily. 02/28/24 05/28/24 Yes Jonita Albee, PA-C  LORazepam (ATIVAN) 0.5 MG tablet 1 tablet Orally Once a day as needed for anxiety for 90 days 10/04/23  Yes [provider]  methotrexate (RHEUMATREX) 2.5 MG tablet Take 20 mg  by mouth every Tuesday. Take 8 tablets by mouth every Tuesday  Caution:Chemotherapy. Protect from light.   Yes [provider]  Multiple Vitamin (MULTIVITAMIN) capsule Take 1 capsule by mouth daily.   Yes [provider]  nitroGLYCERIN (NITROSTAT) 0.4 MG SL tablet PLACE 1 TABLET UNDER THE TONGUE EVERY 5 MINUTES AS NEEDED FOR CHEST PAIN. 12/30/23  Yes Runell Gess, MD  ondansetron (ZOFRAN-ODT) 8 MG disintegrating tablet Take 8 mg by mouth every 8 (eight) hours as needed for nausea. 01/11/24  Yes [provider]  predniSONE (DELTASONE) 1 MG tablet Take 4 mg by mouth daily. 05/02/23  Yes [provider]  rosuvastatin (CRESTOR) 20 MG tablet Take 20 mg by mouth daily. 11/11/23  Yes [provider]    Past Medical History:  Diagnosis Date   Allergy to IVP dye    Angina, class I (HCC) 04/22/2015   Atherosclerotic heart disease native coronary artery w/angina pectoris (HCC) 1992   a. 1992 s/p PTCA of LAD;  b. 1998 CABG x 3; c. 07/2001 Cath: Sev LM/LAD/LCX dzs, 3/3 patent grafts; d. 11/2013 Cath: stable graft anatomy; e. 10/2016 Cath: native 3VD, VG->OM nl, LIMA->LAD nl, VG->Diag 55.   Atherosclerotic heart disease of native coronary artery with angina pectoris (HCC) 01/14/2010   Qualifier: Diagnosis of  By: Arlyce Dice MD, Barbette Hair    Atrial septal aneurysm    Barrett's esophagus 03/04/2010   Qualifier: Diagnosis of  By: Myrtie Hawk, Amy S    Bilateral leg edema 04/22/2015   Bradycardia, mild briefly to the 40s, asymptomatic 05/16/2013   Carotid arterial disease (HCC)    a. 05/2014 Carotid U/S: No signif bilat ICA stenosis, >60% L ECA.   Chronic anemia    Chronic diastolic CHF (congestive heart failure) (HCC)    a. 10/2016 Echo: Ef 55-60%, no rwma, Gr1 DD, triv AI, PASP , atrial septal aneurysm.   Chronic diastolic CHF (congestive heart failure), NYHA class 2 (HCC) 08/11/2017   Claudication (HCC) 05/16/2013   Peripheral arterial occlusive disease  with lifestyle limiting claudication    Degenerative joint disease 05/30/2018   DIVERTICULOSIS OF COLON 01/14/2010   Qualifier: Diagnosis of  By: Arlyce Dice MD, Barbette Hair    DM (diabetes mellitus), type 2 with peripheral vascular complications (HCC)    On Oral medications.   DOE (dyspnea on exertion) 04/22/2015   Essential hypertension    FECAL INCONTINENCE 03/04/2010   Qualifier: Diagnosis of  By: Monica Becton PA-c, Amy S    GERD (gastroesophageal reflux disease)    Hyperlipidemia with target LDL less than 70    Inflammatory arthritis 10/13/2017   Neuropathy    Non-insulin dependent type 2 diabetes mellitus (HCC) 10/24/2015   NSTEMI (non-ST elevated myocardial infarction) (HCC) 11/01/2016   PAD (peripheral artery disease) (HCC) 05/2013; 09/2013   a. severe calcified POP-1 & TP Trunk Dz bilat;  b . 05/2013 Diamondback Rot Athrectomy (R POP) --> PTA  R Pop, DES to R Peroneal;  c. 09/2013 PTA/Stenting of L Pop Clemencia Course - retrograde access);  d. 04/2014 ABI's: R/L: 1.1/1.1.   Preoperative cardiovascular examination 04/15/2020   PUD (peptic ulcer disease)    Rheumatoid arthritis (HCC)    S/P CABG x 3 1998   LIMA-LAD, SVG-OM, SVG-D1   Severe claudication (HCC) 05/2013   Referred for Peripheral Angio (Dr. Allyson Sabal --> Dr. Hoy Finlay in [place])   [facility] ALLERGY 01/14/2010   Qualifier: Diagnosis of  By: Candice Camp CMA (AAMA), [name]     Status post total replacement of right hip 10/12/2018   Vertigo 01/02/2014    Past Surgical History:  Procedure Laterality Date   ATHERECTOMY Right 05/15/2013   Procedure: ATHERECTOMY;  Surgeon: Runell Gess, MD;  Location: [facility] CATH LAB;  Service: Cardiovascular;  Laterality: Right;  popliteal   BACK SURGERY     CARDIAC CATHETERIZATION  07/17/01   2 v CAD with LM, circ, LAD, obtuse diag, mod stenosis of diag vein graft , mild stenosis of marg vein graft, nl EF, medical treatment   CARDIAC CATHETERIZATION  11/2013   Widely patent LIMA-LAD & SVG-OM with mild  progression of proximal stenosis ~40-50% in SVG-D1; Widely patent native RCA with known severe native LCA disease   CARDIAC CATHETERIZATION N/A 11/01/2016   Procedure: Left Heart Cath and Cors/Grafts Angiography;  Surgeon: [name], MD;  Location: [facility] INVASIVE CV LAB;  Service: Cardiovascular: Hemodynamics: High LVEDP!.  Cors/Grafts: LM 55%, o-p LAD 100% then after D1 -> LIMA-LAD patent, SVG-Diag ~55%. small RI wiht ~70% ostial. O-p Cx 80% - pCx 70% --> SVG-bifurcating OM patent. -- med Rx   CORONARY ANGIOPLASTY  1992   PCI to LAD   CORONARY ARTERY BYPASS GRAFT  1998   LIMA-LAD, SVG-diagonal (none roughly 50% stenosis), SVG-OM   LEFT HEART CATHETERIZATION WITH CORONARY ANGIOGRAM N/A 11/27/2013   Procedure: LEFT HEART CATHETERIZATION WITH CORONARY ANGIOGRAM;  Surgeon: [name], MD;  Location: [facility] CATH LAB;  Service: Cardiovascular;  Laterality: N/A;   LOW EXTREMITY DOPPLERS/ABI  10/2016   Patent right SFA, popliteal and peroneal tendons. Patent left SFA and popliteal stent. 30-50% stenosis in the right common femoral and popliteal arteries, 50-74% stenosis in left profunda femoris artery. --> 1 year follow-up.  RABI - 1.2, LABI 1.1   LOWER EXTREMITY ANGIOGRAM N/A 02/22/2013   Procedure: LOWER EXTREMITY ANGIOGRAM;  Surgeon: Runell Gess, MD;  Location: [facility] CATH LAB;  Service: Cardiovascular;  Laterality: N/A;   LOWER EXTREMITY ANGIOGRAM N/A 07/20/2013   Procedure: LOWER EXTREMITY ANGIOGRAM;  Surgeon: Runell Gess, MD;  Location: [facility] CATH LAB;  Service: Cardiovascular;  Laterality: N/A;   NM MYOVIEW LTD  04/03/2013; 5/26'/2016   Lexiscan: a)  Apical perfusion defect with no ischemia. Consider prior infarct versus breast attenuation.;; b) 5/'16: LOW RISK, Nl EF ~55-65%, No Ischemia /Infarction   NM MYOVIEW LTD  10/'19; 4/'21   a) LOW RISK.  EF 55%.  No ischemia or infarction. b) EF estimated 81%.  Suboptimal images.  May be subtle inferior ischemia-initially read by radiology as  intermediate risk, however overread by cardiology to be mild low risk.Marland Kitchen   PERCUTANEOUS STENT INTERVENTION Right 05/15/2013   Procedure: PERCUTANEOUS STENT INTERVENTION;  Surgeon: Runell Gess, MD;  Location: [facility] CATH LAB;  Service: Cardiovascular;  Laterality: Right;  tibioperoneal trunk   Peroneal Artery Stent  05/15/13   Diamondback rot. atherectomy of high grade segmental popliteal stenosis then Chocolate balloonPTA; then angiosculpt PTA of  the prox peroneal with stent-Xpedition    pv angio  07/20/13   high-grade calcified popliteal disease with one-vessel runoff via a small anterior tibial artery that has proximal disease as well, no intervention   TOTAL HIP ARTHROPLASTY Right 10/12/2018   Procedure: RIGHT TOTAL HIP ARTHROPLASTY ANTERIOR APPROACH;  Surgeon: Kathryne Hitch, MD;  Location: [facility] ORS;  Service: Orthopedics;  Laterality: Right;   TOTAL HIP ARTHROPLASTY Left 11/21/2020   Procedure: LEFT TOTAL HIP ARTHROPLASTY ANTERIOR APPROACH;  Surgeon: Kathryne Hitch, MD;  Location: [facility] ORS;  Service: Orthopedics;  Laterality: Left;   TRANSTHORACIC ECHOCARDIOGRAM  11/'17; 10/19   a) EF 55-60%. Gr 1 DD. Normal PAP. Aortic Sclerosis.;; b) Normal LV size and function with vigorous EF 65 to 70%.  GR 1 DD.  Severe LA dilation (suggestive of probably worsening: GR 1 DD).   TRANSTHORACIC ECHOCARDIOGRAM  03/18/2020   EF 70-75%.  Hyperdynamic.  GR 1 DD.  No R WMA.  Mild LA dilation.  Mild aortic valve sclerosis but no stenosis.-No change from prior echo   TUBAL LIGATION       reports that she has never smoked. She has never used smokeless tobacco. She reports that she does not currently use alcohol. She reports that she does not use drugs.  Family History  Adopted: Yes  Problem Relation Age of Onset   Hypertension Mother    Hyperlipidemia Mother    Hyperlipidemia Father    Hypertension Father      Physical Exam: Vitals:   03/11/24 0155 03/11/24 0200 03/11/24 0220 03/11/24 0259   BP: (!) 203/72 (!) 185/101 (!) 185/85   Pulse:    (!) 49  Resp:      Temp:      TempSrc:      SpO2:  99% 100% 99%  Weight:      Height:        Gen: Awake, alert, elderly, frail CV: Regular, normal S1, S2, 2/6 SEM Resp: Normal WOB, rales in the bases Abd: Flat, normoactive, mild epigastric tenderness MSK: Symmetric, no edema  Skin: No rashes or lesions to exposed skin  Neuro: Alert and interactive  Psych: euthymic, appropriate    Data review:   Labs reviewed, notable for:   Creatinine 1.1 (baseline 0.9.) Glucose 262 High-sensitivity Trop 8 x 2  Micro:  Results for orders placed or performed during the hospital encounter of 10/19/22  Resp Panel by RT-PCR (Flu A&B, Covid) Anterior Nasal Swab     Status: None   Collection Time: 10/19/22  2:41 PM   Specimen: Anterior Nasal Swab  Result Value Ref Range Status   SARS Coronavirus 2 by RT PCR NEGATIVE NEGATIVE Final    Comment: (NOTE) SARS-CoV-2 target nucleic acids are NOT DETECTED.  The SARS-CoV-2 RNA is generally detectable in upper respiratory specimens during the acute phase of infection. The lowest concentration of SARS-CoV-2 viral copies this assay can detect is 138 copies/mL. A negative result does not preclude SARS-Cov-2 infection and should not be used as the sole basis for treatment or other patient management decisions. A negative result may occur with  improper specimen collection/handling, submission of specimen other than nasopharyngeal swab, presence of viral mutation(s) within the areas targeted by this assay, and inadequate number of viral copies(<138 copies/mL). A negative result must be combined with clinical observations, patient history, and epidemiological information. The expected result is Negative.  Fact Sheet for Patients:  BloggerCourse.com  Fact Sheet for Healthcare Providers:  SeriousBroker.it  This test is no  t yet approved or cleared by  the [place] and  has been authorized for detection and/or diagnosis of SARS-CoV-2 by FDA under an Emergency Use Authorization (EUA). This EUA will remain  in effect (meaning this test can be used) for the duration of the COVID-19 declaration under Section 564(b)(1) of the Act, 21 U.S.C.section 360bbb-3(b)(1), unless the authorization is terminated  or revoked sooner.       Influenza A by PCR NEGATIVE NEGATIVE Final   Influenza B by PCR NEGATIVE NEGATIVE Final    Comment: (NOTE) The Xpert Xpress SARS-CoV-2/FLU/RSV plus assay is intended as an aid in the diagnosis of influenza from Nasopharyngeal swab specimens and should not be used as a sole basis for treatment. Nasal washings and aspirates are unacceptable for Xpert Xpress SARS-CoV-2/FLU/RSV testing.  Fact Sheet for Patients: BloggerCourse.com  Fact Sheet for Healthcare Providers: SeriousBroker.it  This test is not yet approved or cleared by the [place] FDA and has been authorized for detection and/or diagnosis of SARS-CoV-2 by FDA under an Emergency Use Authorization (EUA). This EUA will remain in effect (meaning this test can be used) for the duration of the COVID-19 declaration under Section 564(b)(1) of the Act, 21 U.S.C. section 360bbb-3(b)(1), unless the authorization is terminated or revoked.  Performed at [facility], [address] [place], [place] [address]     Imaging reviewed:  DG Chest 2 View Result Date: 03/10/2024 CLINICAL DATA:  Chest pain EXAM: CHEST - 2 VIEW COMPARISON:  11/19/2023 FINDINGS: Low lung volumes. Sternotomy and CABG. Aortic atherosclerotic calcification. Left basilar atelectasis or infiltrates. No pleural effusion or pneumothorax. No displaced rib fractures. Elevated right hemidiaphragm. IMPRESSION: Low lung volumes with left basilar atelectasis or infiltrates. Electronically Signed   By: Minerva Fester M.D.    On: 03/10/2024 21:42    EKG:  Personally reviewed sinus rhythm, T wave inversion anteriorly  ED Course:  With EMS did not receive aspirin due to nausea.  Treated with 200 cc IV fluid by EMS.  No additional treatment in the ED   Assessment/Plan:  89 y.o. female with hx CAD, CABG, PAD, HFpEF, hypertension, hyperlipidemia, diabetes, peptic ulcer disease, RA, who presented due to chest pain.   Chest pain, atypical Developed relatively constant chest pressure x 1 day and later with chest pain radiating to the back around 7 PM.  Took 3 tablets of nitroglycerin although pain had persisted.  Symptoms resolved in the ED. high-sensitivity troponin negative x 2.  Lab evaluation otherwise unremarkable.  EKG with anterior T wave inversion.  Chest x-ray with left basilar atelectasis versus infiltrate and low volumes.  See additional discussion labile hypertension below.  Notably her ISMN was recently reduced.  Etiology of her chest pain may be related to stable angina in the setting of reduction in her nitrates.  Other considerations would include gastritis/esophagitis in setting of medications including chronic steroid, methotrexate; less likely pneumonia/pleurisy and absence of suggestive symptoms although chest x-ray with possible left base infiltrate.  Less likely aortic pathology considering improvement in symptoms, symmetric BP on my evaluation.  - Routine cardiology consult in the morning, not contacted overnight - Load with aspirin 325 mg x 1, otherwise continue Plavix 75 mg daily for now - Hold off on DAPT, heparin as does not appear consistent with ACS - Symptomatic management with nitroglycerin paste every 6 hours as needed - Hypertension management per below - Check lipid panel and A1c - For antianginals continue home Coreg.  Has adverse event to  amlodipine, but would consider reintroducing.  Continue isosorbide mononitrate 30 mg. - Start pantoprazole for empiric treatment of gastritis, GI  cocktail as needed - If recurrent severe chest pain with radiation to back in setting of severe hypertension I would consider CTA aortic protocol  Labile hypertension Hypertensive urgency Recent hypotension Outpatient with recent hypotension in clinic and hydralazine has been discontinued, isosorbide mononitrate reduced to 30 mg.  Yesterday with severe range hypertension into the 240s and took hydralazine.  However prior to EMS arrival systolic pressure in the 70s and had what sounds like syncopal episode.  In the emergency department with labile pressures ranging from the 140s into the 240s. Had planned for Hydralazine on floor per prns but BP dropped from 230 -> 130s systolic prior to administration.  -Continue home carvedilol 6.25 twice daily, isosorbide mononitrate 30 mg -Hydralazine 10 mg IV every 6 hours as needed for SBP greater than 180; can escalate as needed -Would consider reintroduction of amlodipine as only adverse event with leg swelling and may help as antianginal medication.  -May benefit from ambulatory blood pressure monitoring  Chronic medical problems: CAD, CABG/PAD/HLD: Continue home Plavix, rosuvastatin. HFpEF: Without acute exacerbation, not on diuretics at home. Diabetes type 2: Hold home glimepiride. SSI for very sensitive while inpatient. Peptic ulcer disease: See above starting on pantoprazole for empiric treatment of gastritis. Rheumatoid arthritis: On methotrexate weekly on Tuesdays.  Continue home prednisone and 4 mg daily.  Continue home folate  Body mass index is 26.07 kg/m.    DVT prophylaxis:  SCDs Code Status:  Full Code Diet:  Diet Orders (From admission, onward)     Start     Ordered   03/11/24 0049  Diet Heart Room service appropriate? Yes; Fluid consistency: Thin  Diet effective now       Question Answer Comment  Room service appropriate? Yes   Fluid consistency: Thin      03/11/24 0049           Family Communication:  No   Consults:   None   Admission status:   Observation, Telemetry bed  Severity of Illness: The appropriate patient status for this patient is OBSERVATION. Observation status is judged to be reasonable and necessary in order to provide the required intensity of service to ensure the patient's safety. The patient's presenting symptoms, physical exam findings, and initial radiographic and laboratory data in the context of their medical condition is felt to place them at decreased risk for further clinical deterioration. Furthermore, it is anticipated that the patient will be medically stable for discharge from the hospital within 2 midnights of admission.    Dolly Rias, MD [facility]  How to contact the [facility] Attending or Consulting provider 7A - 7P or covering provider during after hours 7P -7A, for this patient.  Check the care team in [facility] and look for a) attending/consulting TR[facility] provider listed and b) the [facility] team listed Log into [email or web address] and use [place]'s universal password to access. If you do not have the password, please contact the hospital operator. Locate the [facility] provider you are looking for under [facility] and page to a number that you can be directly reached. If you still have difficulty reaching the provider, please page the [facility] (Director on Call) for the Hospitalists listed on amion for assistance.  03/11/2024, 3:22 AM

## 2024-03-11 NOTE — Progress Notes (Signed)
 Dr Lazarus Salines saw patients b/p was elevated and asked me to recheck and give Nitro Paste if it didn't come down . Patients b/p remains elevated and  Nitro  Paste applied before she transferred off the unit .

## 2024-03-11 NOTE — Progress Notes (Signed)
 At 1610 patients blood pressure of Left arm was 208/73 and Right arm at 213/78. Md notified and PA came bedside, Prn hydralazine was given to patient with bp being checked about 20 min later around 1635: 173/74. Was told to check again at 1500 showing 174/72.  Checked Bp again at 1800 and was 143/63.

## 2024-03-11 NOTE — Progress Notes (Addendum)
 Hyperglycemia secondary to steroid use History of non-insulin-dependent DM type II-A1c 8.8 02/2024 Due to Solu-Medrol patient's blood glucose is elevated 315 now. Continue to check POC blood glucose every 4 hour and starting sliding scale insulin.  Tereasa Coop, MD [facility] 03/11/2024, 9:23 PM

## 2024-03-11 NOTE — Consult Note (Addendum)
 Cardiology Consultation   Patient ID: [name] MRN: [id number]; DOB: 1933/09/02  Admit date: 03/10/2024 Date of Consult: 03/11/2024  PCP:  Irena Reichmann, DO   [facility] Providers Cardiologist:  Bryan Lemma, MD  [facility] Cardiologist:  [name], MD  {  Patient Profile:   [name] is a 89 y.o. female with a history of CAD s/p remote CABG x3 (LIMA-LAD, SVG-OM, SVG-D1) in 1998, PAD s/p atherectomy/ PTA of distal right SFA and DES to peroneal artery in 05/2013 followed by intervention of left popliteal and tibioperoneal trunk disease in 09/2013, CVA,  carotid artery disease, hypertension, hyperlipidemia, type 2 diabetes mellitus, GERD with Barrett's esophagus, and chronic anemia who is being seen 03/11/2024 for the evaluation of chest pain at the request of Dr. Flossie Dibble.  History of Present Illness:   [name] is a 89 year old female with a significant cardiovascular history as above who is followed by Dr. Herbie Baltimore and Dr. Allyson Sabal.  She has a history of CAD with remote CABG x3 with LIMA to LAD, SVG to OM, and SVG to D1 in 1998. Last cardiac catheterization in 10/2016 during an admission for a NSTEMI showed 55% stenosis of ostial SVG to D1 but LIMA to LAD and SVG to OM were widely patent. Overall, she had stable coronary disease and medical therapy was recommended. She has intermittent chest pain and has had multiple stress test since then. Last Myoview in 01/2022 was low risk with no evidence of ischemia. She also has a history of PAD with prior intervention in 2014 detailed above. Last Echo in 04/2023 showed LVEF of 65-70% with mild asymmetric LVH of the septal segment and grade 1 diastolic dysfunction, normal RV function, and mildly elevated PASP.    She was last seen by Dr. Allyson Sabal in 01/2024 at which time she reported a month of nausea, vomiting, and diarrhea but was stable from a cardiac standpoint. Her BP was 80/52 in the office and her Hydralazine was stopped. She was recently  seen by Robet Leu, PA-C, on 02/28/2024 at which time her main concerns were dizziness and bilateral leg/ foot numbness. However, she denies any chest pain or shortness of breath. She was hypotensive in the office with BP of 90/68 but this improved after she ate some crackers and drank water. Imdur was decreased. Leg pain/ numbness was felt to likely be due to neuropathy and she was advised to follow-up with her PCP. However, lower extremity arterial ultrasounds/ ABIs were also ordered.  Patient presented to the ED on 03/10/2024 with chest pain. EKG showed sinus bradycardia, rate 54 bpm, with T wave inversions in leads aVL and V2 which have been seen transiently on prior tracings. High-sensitivity troponin negative x2 (8 >> 8). Chest x-ray showed low lung volumes with left basilar atelectasis or infiltrates.  WBC 7.1, Hgb 12.3, Plts 228. Na 135, K 4.6, Glucose 262, BUN 16, Cr 1.19.  Patient was admitted and Cardiology consulted for further evaluation.  At the time of this evaluation, patient is resting comfortably in no acute distress.  She states she has not been feeling well since the beginning of the year.  Since 12/2023, she is having significant fatigue and weakness as well as persistent nausea.  She also reports worsened diarrhea until recently which now been replaced by significant constipation.  She describes rectal pain and nausea after having a bowel movement.  She has discussed this with her her PCP who prescribed Zofran. It also sounds like she  has been referred to GI but cannot be seen until May.  History morning she woke up and noticed 7 out of 10 chest heaviness that radiated to her back with associated shortness of breath.  Her BP was also markedly elevated at the time in the 210s over 110s.  She took a dose of hydralazine which she still had at home but chest heaviness persisted.  She was also feeling nauseous.  She then took 3 doses of Nitroglycerin over about 3 to 4 hours.  It sounds like  during this time her pain was waxing and waning. Pain slightly worse with deep inspiration. Otherwise, no aggravating or alleviating factors. Not worse with exertion. She then reportedly had a syncopal episode.  She states she was sitting on the couch when her daughter states she just passed out.  She does not remember this.  When she came to, EMS was there and per chart review her systolic BP was noted to be in the 70s.  She denies any syncopal episodes before this but does states she has had a couple falls over the last few months due to weakness.  She denies any palpitations, lightheadedness, dizziness prior to the syncopal episode.  She states she was still having some chest heaviness prior to this episode but states it was much improved from earlier in the morning.  She denies any other chest pain recently prior to yesterday.  She does not usually have any shortness of breath.  She does describe 2-3 pillow orthopnea but states this is not new.  No PND.  No significant edema. No known fevers. No URI symptoms. She does report some bright red blood in her stools recently as well as some black stool.   Past Medical History:  Diagnosis Date   Allergy to IVP dye    Angina, class I (HCC) 04/22/2015   Atherosclerotic heart disease native coronary artery w/angina pectoris (HCC) 1992   a. 1992 s/p PTCA of LAD;  b. 1998 CABG x 3; c. 07/2001 Cath: Sev LM/LAD/LCX dzs, 3/3 patent grafts; d. 11/2013 Cath: stable graft anatomy; e. 10/2016 Cath: native 3VD, VG->OM nl, LIMA->LAD nl, VG->Diag 55.   Atherosclerotic heart disease of native coronary artery with angina pectoris (HCC) 01/14/2010   Qualifier: Diagnosis of  By: Arlyce Dice MD, Barbette Hair    Atrial septal aneurysm    Barrett's esophagus 03/04/2010   Qualifier: Diagnosis of  By: Myrtie Hawk, Amy S    Bilateral leg edema 04/22/2015   Bradycardia, mild briefly to the 40s, asymptomatic 05/16/2013   Carotid arterial disease (HCC)    a. 05/2014 Carotid U/S: No signif  bilat ICA stenosis, >60% L ECA.   Chronic anemia    Chronic diastolic CHF (congestive heart failure) (HCC)    a. 10/2016 Echo: Ef 55-60%, no rwma, Gr1 DD, triv AI, PASP , atrial septal aneurysm.   Chronic diastolic CHF (congestive heart failure), NYHA class 2 (HCC) 08/11/2017   Claudication (HCC) 05/16/2013   Peripheral arterial occlusive disease with lifestyle limiting claudication    Degenerative joint disease 05/30/2018   DIVERTICULOSIS OF COLON 01/14/2010   Qualifier: Diagnosis of  By: Arlyce Dice MD, Barbette Hair    DM (diabetes mellitus), type 2 with peripheral vascular complications (HCC)    On Oral medications.   DOE (dyspnea on exertion) 04/22/2015   Essential hypertension    FECAL INCONTINENCE 03/04/2010   Qualifier: Diagnosis of  By: Monica Becton PA-c, Amy S    GERD (gastroesophageal reflux disease)    Hyperlipidemia with  target LDL less than 70    Inflammatory arthritis 10/13/2017   Neuropathy    Non-insulin dependent type 2 diabetes mellitus (HCC) 10/24/2015   NSTEMI (non-ST elevated myocardial infarction) (HCC) 11/01/2016   PAD (peripheral artery disease) (HCC) 05/2013; 09/2013   a. severe calcified POP-1 & TP Trunk Dz bilat;  b . 05/2013 Diamondback Rot Athrectomy (R POP) --> PTA R Pop, DES to R Peroneal;  c. 09/2013 PTA/Stenting of L Pop Clemencia Course - retrograde access);  d. 04/2014 ABI's: R/L: 1.1/1.1.   Preoperative cardiovascular examination 04/15/2020   PUD (peptic ulcer disease)    Rheumatoid arthritis (HCC)    S/P CABG x 3 1998   LIMA-LAD, SVG-OM, SVG-D1   Severe claudication (HCC) 05/2013   Referred for Peripheral Angio (Dr. Allyson Sabal --> Dr. Hoy Finlay in [place])   [facility] ALLERGY 01/14/2010   Qualifier: Diagnosis of  By: Candice Camp CMA (AAMA), [name]     Status post total replacement of right hip 10/12/2018   Vertigo 01/02/2014    Past Surgical History:  Procedure Laterality Date   ATHERECTOMY Right 05/15/2013   Procedure: ATHERECTOMY;  Surgeon: Runell Gess,  MD;  Location[facility] CATH LAB;  Service: Cardiovascular;  Laterality: Right;  popliteal   BACK SURGERY     CARDIAC CATHETERIZATION  07/17/01   2 v CAD with LM, circ, LAD, obtuse diag, mod stenosis of diag vein graft , mild stenosis of marg vein graft, nl EF, medical treatment   CARDIAC CATHETERIZATION  11/2013   Widely patent LIMA-LAD & SVG-OM with mild progression of proximal stenosis ~40-50% in SVG-D1; Widely patent native RCA with known severe native LCA disease   CARDIAC CATHETERIZATION N/A 11/01/2016   Procedure: Left Heart Cath and Cors/Grafts Angiography;  Surgeon: [name], MD;  Location: [facility] INVASIVE CV LAB;  Service: Cardiovascular: Hemodynamics: High LVEDP!.  Cors/Grafts: LM 55%, o-p LAD 100% then after D1 -> LIMA-LAD patent, SVG-Diag ~55%. small RI wiht ~70% ostial. O-p Cx 80% - pCx 70% --> SVG-bifurcating OM patent. -- med Rx   CORONARY ANGIOPLASTY  1992   PCI to LAD   CORONARY ARTERY BYPASS GRAFT  1998   LIMA-LAD, SVG-diagonal (none roughly 50% stenosis), SVG-OM   LEFT HEART CATHETERIZATION WITH CORONARY ANGIOGRAM N/A 11/27/2013   Procedure: LEFT HEART CATHETERIZATION WITH CORONARY ANGIOGRAM;  Surgeon: [name], MD;  Location: [facility] CATH LAB;  Service: Cardiovascular;  Laterality: N/A;   LOW EXTREMITY DOPPLERS/ABI  10/2016   Patent right SFA, popliteal and peroneal tendons. Patent left SFA and popliteal stent. 30-50% stenosis in the right common femoral and popliteal arteries, 50-74% stenosis in left profunda femoris artery. --> 1 year follow-up.  RABI - 1.2, LABI 1.1   LOWER EXTREMITY ANGIOGRAM N/A 02/22/2013   Procedure: LOWER EXTREMITY ANGIOGRAM;  Surgeon: Runell Gess, MD;  Location: [facility] CATH LAB;  Service: Cardiovascular;  Laterality: N/A;   LOWER EXTREMITY ANGIOGRAM N/A 07/20/2013   Procedure: LOWER EXTREMITY ANGIOGRAM;  Surgeon: Runell Gess, MD;  Location: [facility] CATH LAB;  Service: Cardiovascular;  Laterality: N/A;   NM MYOVIEW LTD  04/03/2013; 5/26'/2016   Lexiscan:  a)  Apical perfusion defect with no ischemia. Consider prior infarct versus breast attenuation.;; b) 5/'16: LOW RISK, Nl EF ~55-65%, No Ischemia /Infarction   NM MYOVIEW LTD  10/'19; 4/'21   a) LOW RISK.  EF 55%.  No ischemia or infarction. b) EF estimated 81%.  Suboptimal images.  May be subtle inferior ischemia-initially read by radiology as intermediate risk, however overread  by cardiology to be mild low risk.Marland Kitchen   PERCUTANEOUS STENT INTERVENTION Right 05/15/2013   Procedure: PERCUTANEOUS STENT INTERVENTION;  Surgeon: Runell Gess, MD;  Location: [facility] CATH LAB;  Service: Cardiovascular;  Laterality: Right;  tibioperoneal trunk   Peroneal Artery Stent  05/15/13   Diamondback rot. atherectomy of high grade segmental popliteal stenosis then Chocolate balloonPTA; then angiosculpt PTA of the prox peroneal with stent-Xpedition    pv angio  07/20/13   high-grade calcified popliteal disease with one-vessel runoff via a small anterior tibial artery that has proximal disease as well, no intervention   TOTAL HIP ARTHROPLASTY Right 10/12/2018   Procedure: RIGHT TOTAL HIP ARTHROPLASTY ANTERIOR APPROACH;  Surgeon: Kathryne Hitch, MD;  Location: [facility] ORS;  Service: Orthopedics;  Laterality: Right;   TOTAL HIP ARTHROPLASTY Left 11/21/2020   Procedure: LEFT TOTAL HIP ARTHROPLASTY ANTERIOR APPROACH;  Surgeon: Kathryne Hitch, MD;  Location: [facility] ORS;  Service: Orthopedics;  Laterality: Left;   TRANSTHORACIC ECHOCARDIOGRAM  11/'17; 10/19   a) EF 55-60%. Gr 1 DD. Normal PAP. Aortic Sclerosis.;; b) Normal LV size and function with vigorous EF 65 to 70%.  GR 1 DD.  Severe LA dilation (suggestive of probably worsening: GR 1 DD).   TRANSTHORACIC ECHOCARDIOGRAM  03/18/2020   EF 70-75%.  Hyperdynamic.  GR 1 DD.  No R WMA.  Mild LA dilation.  Mild aortic valve sclerosis but no stenosis.-No change from prior echo   TUBAL LIGATION       Home Medications:  Prior to Admission medications   Medication Sig Start Date  End Date Taking? Authorizing Provider  acetaminophen (TYLENOL) 650 MG CR tablet Take 650 mg by mouth daily as needed for pain.   Yes [provider]  carvedilol (COREG) 6.25 MG tablet TAKE 1 TABLET BY MOUTH 2 TIMES DAILY WITH A MEAL. 01/19/24  Yes Runell Gess, MD  Cholecalciferol (VITAMIN D3) 10 MCG (400 UNIT) tablet Take 400 Units by mouth daily.   Yes [provider]  clopidogrel (PLAVIX) 75 MG tablet Take 75 mg by mouth at bedtime.   Yes [provider]  denosumab (PROLIA) 60 MG/ML SOSY injection Inject 60 mg into the skin every 6 (six) months.   Yes [provider]  ezetimibe (ZETIA) 10 MG tablet Take 10 mg by mouth at bedtime.   Yes [provider]  folic acid (FOLVITE) 1 MG tablet Take 1 mg by mouth daily. 08/21/18  Yes [provider]  gabapentin (NEURONTIN) 400 MG capsule Take 400 mg by mouth 2 (two) times daily. 09/07/22  Yes [provider]  glimepiride (AMARYL) 2 MG tablet Take 2 mg by mouth daily with breakfast. 01/07/22  Yes [provider]  isosorbide mononitrate (IMDUR) 30 MG 24 hr tablet Take 1 tablet (30 mg total) by mouth daily. 02/28/24 05/28/24 Yes Jonita Albee, PA-C  LORazepam (ATIVAN) 0.5 MG tablet 1 tablet Orally Once a day as needed for anxiety for 90 days 10/04/23  Yes [provider]  methotrexate (RHEUMATREX) 2.5 MG tablet Take 20 mg by mouth every Tuesday. Take 8 tablets by mouth every Tuesday  Caution:Chemotherapy. Protect from light.   Yes [provider]  Multiple Vitamin (MULTIVITAMIN) capsule Take 1 capsule by mouth daily.   Yes [provider]  nitroGLYCERIN (NITROSTAT) 0.4 MG SL tablet PLACE 1 TABLET UNDER THE TONGUE EVERY 5 MINUTES AS NEEDED FOR CHEST PAIN. 12/30/23  Yes Runell Gess, MD  ondansetron (ZOFRAN-ODT) 8 MG disintegrating tablet Take 8 mg  by mouth every 8 (eight) hours as needed for nausea. 01/11/24  Yes [provider]  predniSONE  (DELTASONE) 1 MG tablet Take 4 mg by mouth daily. 05/02/23  Yes [provider]  rosuvastatin (CRESTOR) 20 MG tablet Take 20 mg by mouth daily. 11/11/23  Yes [provider]    Inpatient Medications: Scheduled Meds:  alum & mag hydroxide-simeth  30 mL Oral Once   carvedilol  6.25 mg Oral BID WC   clopidogrel  75 mg Oral QHS   ezetimibe  10 mg Oral QHS   folic acid  1 mg Oral Daily   gabapentin  400 mg Oral BID   insulin aspart  0-6 Units Subcutaneous TID WC   isosorbide mononitrate  30 mg Oral Daily   multivitamin with minerals  1 tablet Oral Daily   pantoprazole  40 mg Oral Daily   predniSONE  4 mg Oral Daily   rosuvastatin  20 mg Oral Daily   sodium chloride flush  3 mL Intravenous Q12H   Continuous Infusions:  PRN Meds: acetaminophen, hydrALAZINE, LORazepam, melatonin, nitroGLYCERIN, ondansetron (ZOFRAN) IV, polyethylene glycol  Allergies:    Allergies  Allergen Reactions   Fish Allergy Hives and Shortness Of Breath   Iodinated Contrast Media Shortness Of Breath and Anaphylaxis    Other reaction(s): Unknown   Latex Anaphylaxis, Hives, Shortness Of Breath, Itching and Other (See Comments)    REACTION: wheezing Other reaction(s): Unknown   Shellfish Allergy Anaphylaxis, Hives, Shortness Of Breath and Other (See Comments)    All seafood   Amlodipine Swelling   Evolocumab Other (See Comments)    Other reaction(s): latex on syringe Unknown reaction   Other Itching and Other (See Comments)    Patient reports allergy to perfumed detergents   Metformin Nausea And Vomiting    Social History:   Social History   Socioeconomic History   Marital status: Married    Spouse name: Not on file   Number of children: Not on file   Years of education: Not on file   Highest education level: Not on file  Occupational History   Occupation: retired  Tobacco Use   Smoking status: Never   Smokeless tobacco: Never  Vaping Use   Vaping status: Never Used  Substance  and Sexual Activity   Alcohol use: Not Currently   Drug use: No   Sexual activity: Not on file  Other Topics Concern   Not on file  Social History Narrative   Right handed   Lives alone ( husband in rehab facility)   Caffeine- mostly coffee       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   Social Drivers of Health   Financial Resource Strain: Not on file  Food Insecurity: No Food Insecurity (03/11/2024)   Hunger Vital Sign    Worried About Running Out of Food in the Last Year: Never true    Ran Out of Food in the Last Year: Never true  Transportation Needs: No Transportation Needs (03/11/2024)   PRAPARE - Administrator, Civil Service (Medical): No    Lack of Transportation (Non-Medical): No  Physical Activity: Not on file  Stress: Not on file  Social Connections: Socially Integrated (03/11/2024)   Social Connection and Isolation Panel [NHANES]    Frequency of Communication with Friends and Family: More than three times a week    Frequency of Social Gatherings with Friends and Family: Twice a week    Attends Religious Services: More than 4 times per year  Active Member of Clubs or Organizations: No    Attends Banker Meetings: More than 4 times per year    Marital Status: Married  Catering manager Violence: Not At Risk (03/11/2024)   Humiliation, Afraid, Rape, and Kick questionnaire    Fear of Current or Ex-Partner: No    Emotionally Abused: No    Physically Abused: No    Sexually Abused: No    Family History:   Family History  Adopted: Yes  Problem Relation Age of Onset   Hypertension Mother    Hyperlipidemia Mother    Hyperlipidemia Father    Hypertension Father      ROS:  Please see the history of present illness.      Physical Exam/Data:   Vitals:   03/11/24 0309 03/11/24 0345 03/11/24 0349 03/11/24 0732  BP: (!) 210/70  132/62 (!) 141/60  Pulse: (!) 50  (!) 55 60  Resp:      Temp:    (!) 97.3 F (36.3 C)  TempSrc:    Oral  SpO2: 99%  98% 97%  Weight:   65.6 kg    Height:  5\' 3"  (1.6 m)     No intake or output data in the 24 hours ending 03/11/24 0816    03/11/2024    3:45 AM 03/11/2024    2:55 AM 03/10/2024    8:56 PM  Last 3 Weights  Weight (lbs) 144 lb 10 oz 144 lb 10 oz 147 lb 2.9 oz  Weight (kg) 65.6 kg 65.6 kg 66.76 kg     Body mass index is 25.62 kg/m.  General: 89 y.o. African-American female resting comfortably in no acute distress. HEENT: Normocephalic and atraumatic. Sclera clear. Neck: Supple. Left carotid bruit noted. No JVD. Heart: RRR. II/VI systolic murmur noted.  Lungs: No increased work of breathing. Faint crackles noted in basis (suspect due to atelectasis). No wheezes or rhonchi.  Abdomen: Soft, non-distended, and non-tender to palpation.  Extremities: No lower extremity edema.    Skin: Warm and dry. Neuro: Alert and oriented x3. No focal deficits. Psych: Normal affect. Responds appropriately.   EKG:  The EKG was personally reviewed and demonstrates:  Sinus bradycardia, rate 54 bpm, with T wave inversions in leads aVL and V2 Telemetry:  Telemetry was personally reviewed and demonstrates:  Sinus rhythm with rates in the 40s to low 60s.  Relevant CV Studies:  Myoview 02/08/2022:   Normal blood pressure and normal heart rate response noted during stress. Heart rate recovery was normal.   No ST deviation was noted. There were no arrhythmias during recovery.   LV perfusion is normal. There is no evidence of ischemia. There is no evidence of infarction.   Left ventricular function is abnormal. Nuclear stress EF: 61 %. The left ventricular ejection fraction is normal (55-65%). End diastolic cavity size is normal. End systolic cavity size is normal. Evidence of transient ischemic dilation (TID) noted. TID was appreciated quantitatively but not visually.   The study is normal. Findings are consistent with no prior ischemia. The study is low risk. _______________  Echocardiogram 04/14/2023: Impressions: 1. Left  ventricular ejection fraction, by estimation, is 65 to 70%. The  left ventricle has normal function. The left ventricle has no regional  wall motion abnormalities. There is mild asymmetric left ventricular  hypertrophy of the septal segment. Left  ventricular diastolic parameters are consistent with Grade I diastolic  dysfunction (impaired relaxation).   2. Right ventricular systolic function is normal. The right ventricular  size is normal. There is mildly elevated pulmonary artery systolic  pressure.   3. Left atrial size was mildly dilated.   4. The mitral valve is normal in structure. No evidence of mitral valve  regurgitation. No evidence of mitral stenosis.   5. The aortic valve is tricuspid. Aortic valve regurgitation is not  visualized. No aortic stenosis is present.   6. The inferior vena cava is normal in size with greater than 50%  respiratory variability, suggesting right atrial pressure of 3 mmHg.    Laboratory Data:  High Sensitivity Troponin:   Recent Labs  Lab 03/10/24 2100 03/10/24 2257  TROPONINIHS 8 8     Chemistry Recent Labs  Lab 03/10/24 2100  NA 135  K 4.6  CL 100  CO2 26  GLUCOSE 262*  BUN 16  CREATININE 1.19*  CALCIUM 9.2  GFRNONAA 44*  ANIONGAP 9    No results for input(s): "PROT", "ALBUMIN", "AST", "ALT", "ALKPHOS", "BILITOT" in the last 168 hours. Lipids No results for input(s): "CHOL", "TRIG", "HDL", "LABVLDL", "LDLCALC", "CHOLHDL" in the last 168 hours.  Hematology Recent Labs  Lab 03/10/24 2100  WBC 7.1  RBC 4.58  HGB 12.3  HCT 41.0  MCV 89.5  MCH 26.9  MCHC 30.0  RDW 13.9  PLT 228   Thyroid No results for input(s): "TSH", "FREET4" in the last 168 hours.  BNPNo results for input(s): "BNP", "PROBNP" in the last 168 hours.  DDimer No results for input(s): "DDIMER" in the last 168 hours.   Radiology/Studies:  DG Chest 2 View Result Date: 03/10/2024 CLINICAL DATA:  Chest pain EXAM: CHEST - 2 VIEW COMPARISON:  11/19/2023  FINDINGS: Low lung volumes. Sternotomy and CABG. Aortic atherosclerotic calcification. Left basilar atelectasis or infiltrates. No pleural effusion or pneumothorax. No displaced rib fractures. Elevated right hemidiaphragm. IMPRESSION: Low lung volumes with left basilar atelectasis or infiltrates. Electronically Signed   By: Minerva Fester M.D.   On: 03/10/2024 21:42     Assessment and Plan:   Chest Pain CAD Patient has a history of CAD with remote CABG x3 in 1998. Last LHC in 2017 showed overall stable disease with moderate stenosis of SVG-D1 but widely patent LIMA-LAD and SVG-OM1. She has a history of intermittent chest pain and has had multiple stress test since then. Last Myoview in 01/2022 was low risk with no evidence of ischemia. She now presents with chest heaviness with radiation to back in the setting of markedly elevated BP. EKG showed sinus bradycardia with T wave inversion ins leads aVL and V2 which have been seen transiently on prior tracings. High-sensitivity troponin negative x2.  - She continues to have some very mild heaviness and back pain but much improved from before. Ranks it at about a 3/10 on the pain scale. She looks comfortable.  - Continue home Imdur 30mg  daily. Will decrease home Coreg to 3.125mg  twice daily given mild bradycardia. - Continue home Plavix 75mg  daily, Crestor 20mg  daily, and Zetia 10mg  daily.  - I have low suspicion that chest pain is due to progressive CAD/ unstable angina.  Possibly due to hypertensive urgency. She has been having persistent nausea, fatigue, and weakness for the last 2 months so may be more GI in nature. Will check Echo. Given markedly elevated BP and back pain on admission, will also get CTA to rule out dissection. If Echo these are unremarkable, can plan for outpatient cardiac PET stress test.  Hypertensive Urgency Labile Hypertension Patient has been having very labile BP recently. She  was noted to be hypotensive in our office twice  recently. However, BP was markedly elevated morning of admission in the 210s/110s. She took a dose of Hydralazine 25mg  and 3 doses of sublingual Nitroglycerin over a 3-5 hour period and systolic BP dropped to the 70s upon EMS arrival. She has also had labile BP while here with systolic BP spiking to 234 overnight. However, BP improved without any intervention. Most recent BP 141/60. - Home medications include Coreg 6.25mg  twice daily and Imdur 30mg  daily. Previously on Hydralazine 25mg  three times daily but this was stopped at visit in 01/2024 due to hypotension. Imdur was also decreased earlier this month to current dose given hypotension.  - Will decrease Coreg to 3.125mg  twice daily due to bradycardia.  - Continue Imdur 30mg  daily.  - Will add Hydralazine 10mg  three times daily. - Unclear why she is having such labile BP. AM Cortisol AM. Will recheck a TSH. We are getting a CTA to rule out dissection which should also allow [place] to evaluate for renal artery stenosis.  Syncope Patient had a syncopal episode prior to admission. Suspect this was due to significant drop in BP. - Will check Echo. - Telemetry shows some mild bradycardia but no significant arrhythmias. Will continue to monitor but do not think this was arrhythmia driven.    PAD S/p prior interventions in 2014.  - Continue Plavix, Crestor, and Zetia as above. - She has lower extremity arterial ultrasound and ABIs scheduled for 03/13/2024 as an outpatient.  Carotid Artery Disease Last carotid dopplers in 04/2021 showed mild disease (1-39% stenosis) of bilateral ICAs. - Left carotid bruit noted on exam. - Can repeat dopplers as an outpatient.  Hyperlipidemia Lipid panel this admission; Total Cholesterol 190, Triglycerides 77, HDL 46, LDL 129. . - Currently on Crestor 20mg  daily and Zetia 10mg  daily. Could increase Crestor to 40mg  daily but given advanced age, likely do not have to be overly aggressive in treating this.  Type 2 Diabetes  Mellitus Hemoglobin A1c 8.8 this admission. - Management per primary team.  CKD Stage III Baseline creatinine around 0.9. Creatinine 1.19 on admission and 1.16 today. - Continue to monitor closely.   Otherwise, per primary team: - Nausea - Type 2 diabetes mellitus - Peptic ulcer disease - Rheumatoid arthritis   Risk Assessment/Risk Scores:   For questions or updates, please contact [facility] Please consult [email or web address] for contact info under    Signed, Corrin Parker, PA-C  03/11/2024 8:16 AM  I have seen and examined the patient along with Corrin Parker, PA-C.  I have reviewed the chart, notes and new data.  I agree with PA/NP's note.  Key new complaints: Currently asymptomatic.  She describes intense precordial chest pain radiating deep into her chest all the way to her back, which sounds different from her previous angina and raises concern for aortic dissection. Key examination changes: Most recent blood pressure 140/70, but had severely elevated blood pressure overnight. Key new findings / data: Normal cardiac troponin, does have anterior T wave inversion on her ECG, but this is an abnormality that has been present off and on on multiple tracings over the last few years.  PLAN: Syncope was due to hypotension after administration of several doses of sublingual nitroglycerin.  The cause for her severely elevated blood pressure, preceding and following the syncopal event, is less clear. Recommend CT angiogram of the aorta to exclude dissection.  Her blood pressure was severely elevated and she had chest pain  radiating through to her spine.  This may also be useful to evaluate for possible renal artery stenosis as a cause for her severely elevated and erratic blood pressure.  Will require premedication with steroids/antihistamines for contrast allergy. She has complex coronary anatomy.  Performing a cardiac catheterization may not be necessary and may not show a  clear culprit for her symptoms.  Ideally, would have a PET scan to guide need for angiography and revascularization before catheterization. Meanwhile we will try to maintain normal blood pressure using a lower dose of hydralazine and the long-acting nitrates (she currently has a Nitropatch on).  Thurmon Fair, MD, [facility] [phone] 03/11/2024, 11:00 AM

## 2024-03-11 NOTE — Plan of Care (Signed)

## 2024-03-11 NOTE — Hospital Course (Addendum)
 [name] is a 89 y.o. female with hx of CAD, CABG, PAD, HFpEF, hypertension, hyperlipidemia, diabetes, peptic ulcer disease, RA, who presented due to chest pain.    Reports that recently she has been bothered by dizziness and nausea which is being evaluated by primary care.  She was hypotensive in clinic and her hydralazine was discontinued.  Recently also seen cardiology and noted to be hypotensive and her isosorbide mononitrate was reduced from 60 to 30 mg daily.   Yesterday in the morning she noted that she had severe range hypertension with systolic up to the 240s.  Although her hydralazine had recently been discontinued she took a dose to help reduce her severe range hypertension.  She had noted chest pressure throughout the day which was relatively constant.  Around 7 PM she developed chest pain in the lower central chest which radiated towards her back.  Due to the onset of worsening chest pain she took a tablet of nitroglycerin and repeated this twice.    Family called EMS.  Around this time she had what sounds like a syncopal episode and was briefly unresponsive.  Per report had systolic BP in the 70s prior to EMS arrival.  At time of interview her chest pain has resolved.  She is able to do light housework and denies any exertional symptoms with this amount of activity.  No dyspnea on exertion.  Is having lightheadedness and dizziness intermittently as well as nausea.  No vomiting or diarrhea.  No other changes in medications other than mentioned above.    Assessment & Plan:   Principal Problem:   Chest pain Active Problems:   CAD (coronary artery disease)   Diabetes mellitus (HCC)   Non-insulin dependent type 2 diabetes mellitus (HCC)   Near syncope   Hx of CABG   Chronic diastolic CHF (congestive heart failure) (HCC)   Polyneuropathy due to type 2 diabetes mellitus (HCC)   Peptic ulcer disease   Type 2 diabetes mellitus with other specified complication (HCC)   Labile  hypertension   Assessment/Plan:   Chest pain-abdominal pain -back pain -Some improvement reported by patient -May be contributed from-mesenteric ischemia -S/p CT angiogram completed 03/11/2024 -no aortic dissection was identified, but 60-70% stenosis of celiac axis, 50-75% stenosis of origin of IMA was noted -Case was discussed overnight with vascular surgery Dr. Hetty Blend -who recommended aggressive control of blood pressure,   -Point-of-care troponin negative -Echo reviewed, no changes on EKG -As needed nitroglycerin, analgesics  Celiac dissection and chronic mesenteric ischemia -Vascular surgery recommends medical management; seen 3/31 consult note;   H/o  CAD, CABG, HFpEF  -Stable angina -may be related to episodic accelerated hypertension   -Troponin x 2 negative -Monitoring vitals-labile BP -No significant changes on EKG-T wave inversion,  -Cardiology consulted -following titrating medications   Labile hypertension/ Hypertensive urgency / Recent hypotension -Pressure improved, still labile today   -POA: Presyncope/syncope at home 1 episode-likely due to drop to BP -Systolic BP as high as 234, as low as 70s -Etiology still not clear  Per cardiology:  Coreg at lowest dose 3.125 mg p.o. twice daily,  hydralazine increased from 10 to 25 mg 3 times daily today 4/1  Imdur increased from 30 to 60 mg p.o. daily today 4/1   Syncope versus presyncope episode at home -Likely related to vasovagal, labile blood pressure-no dysrhythmias appreciated -2D echo reviewed, - CT angiogram all reviewed-aside from recent tract stenosis, no significant aortic dissection or significant findings contributing to syncope     -  May benefit from ambulatory blood pressure monitoring   Chronic medical problems:  CAD, CABG/PAD/HLD: Continue home Plavix, rosuvastatin. HFpEF: Without acute exacerbation, not on diuretics at home. Diabetes type 2: Hold home glimepiride. SSI for very sensitive  while inpatient. Peptic ulcer disease: See above starting on pantoprazole for empiric treatment of gastritis. Rheumatoid arthritis: On methotrexate weekly on Tuesdays.  Continue home prednisone and 4 mg daily.  Continue home folate   Body mass index is 26.07 kg/m.

## 2024-03-11 NOTE — Progress Notes (Signed)
-  Patient has history of CT contrast allergy however in the daytime patient has been premedicated with Benadryl and Solu-Medrol before the CT scan however as patient blood pressure was trended up it has been deferred.  -Patient has labile blood pressure.  Patient become anxious and became hypotensive blood pressure goes up to about 200 range and then drop to lower 130s range without any intervention.  It is seems like that patient has whitecoat syndrome.  -Plan to obtain the CTA chest/abdomen/pelvis tonight.  Again premedicated with Benadryl and IV Solu-Medrol.  If patient becomes anxious and blood pressure goes of during the CT scan>> can give Ativan 0.5 mg and if systolic blood pressure goes above 160 can give hydralazine 5 mg once. Discussed the plan with patient RN Marylene Land.  Tereasa Coop, MD [facility] 03/11/2024, 8:19 PM

## 2024-03-12 DIAGNOSIS — E119 Type 2 diabetes mellitus without complications: Secondary | ICD-10-CM | POA: Diagnosis not present

## 2024-03-12 DIAGNOSIS — I7779 Dissection of other artery: Secondary | ICD-10-CM

## 2024-03-12 DIAGNOSIS — Z951 Presence of aortocoronary bypass graft: Secondary | ICD-10-CM | POA: Diagnosis not present

## 2024-03-12 DIAGNOSIS — R072 Precordial pain: Secondary | ICD-10-CM | POA: Diagnosis not present

## 2024-03-12 DIAGNOSIS — I5032 Chronic diastolic (congestive) heart failure: Secondary | ICD-10-CM

## 2024-03-12 DIAGNOSIS — I2510 Atherosclerotic heart disease of native coronary artery without angina pectoris: Secondary | ICD-10-CM | POA: Diagnosis not present

## 2024-03-12 DIAGNOSIS — R0989 Other specified symptoms and signs involving the circulatory and respiratory systems: Secondary | ICD-10-CM | POA: Diagnosis not present

## 2024-03-12 LAB — GLUCOSE, CAPILLARY
Glucose-Capillary: 153 mg/dL — ABNORMAL HIGH (ref 70–99)
Glucose-Capillary: 164 mg/dL — ABNORMAL HIGH (ref 70–99)
Glucose-Capillary: 199 mg/dL — ABNORMAL HIGH (ref 70–99)
Glucose-Capillary: 201 mg/dL — ABNORMAL HIGH (ref 70–99)
Glucose-Capillary: 210 mg/dL — ABNORMAL HIGH (ref 70–99)
Glucose-Capillary: 223 mg/dL — ABNORMAL HIGH (ref 70–99)
Glucose-Capillary: 273 mg/dL — ABNORMAL HIGH (ref 70–99)
Glucose-Capillary: 278 mg/dL — ABNORMAL HIGH (ref 70–99)

## 2024-03-12 LAB — TSH: TSH: 0.395 u[IU]/mL (ref 0.350–4.500)

## 2024-03-12 LAB — CORTISOL: Cortisol, Plasma: 2.7 ug/dL

## 2024-03-12 MED ORDER — HYDRALAZINE HCL 25 MG PO TABS
25.0000 mg | ORAL_TABLET | Freq: Once | ORAL | Status: AC
  Administered 2024-03-12: 25 mg via ORAL
  Filled 2024-03-12: qty 1

## 2024-03-12 MED ORDER — HYDRALAZINE HCL 50 MG PO TABS
50.0000 mg | ORAL_TABLET | Freq: Three times a day (TID) | ORAL | Status: DC
  Filled 2024-03-12: qty 1

## 2024-03-12 MED ORDER — DIPHENHYDRAMINE HCL 25 MG PO CAPS
50.0000 mg | ORAL_CAPSULE | Freq: Once | ORAL | Status: DC
  Filled 2024-03-12: qty 2

## 2024-03-12 MED ORDER — DIPHENHYDRAMINE HCL 50 MG/ML IJ SOLN: 50.0000 mg | Freq: Once | INTRAMUSCULAR | Status: DC

## 2024-03-12 MED ORDER — HYDRALAZINE HCL 25 MG PO TABS: 25.0000 mg | ORAL_TABLET | Freq: Three times a day (TID) | ORAL | Status: DC

## 2024-03-12 MED ORDER — IOHEXOL 350 MG/ML SOLN
80.0000 mL | Freq: Once | INTRAVENOUS | Status: AC | PRN
  Administered 2024-03-12: 80 mL via INTRAVENOUS

## 2024-03-12 MED ORDER — HYDRALAZINE HCL 25 MG PO TABS
25.0000 mg | ORAL_TABLET | Freq: Three times a day (TID) | ORAL | Status: DC
  Administered 2024-03-12 – 2024-03-14 (×6): 25 mg via ORAL
  Filled 2024-03-12 (×6): qty 1

## 2024-03-12 MED ORDER — METHYLPREDNISOLONE SODIUM SUCC 40 MG IJ SOLR: 40.0000 mg | Freq: Once | INTRAMUSCULAR | Status: DC

## 2024-03-12 NOTE — Progress Notes (Signed)
 Mobility Specialist Progress Note;   03/12/24 1220  Mobility  Activity Ambulated with assistance to bathroom;Ambulated with assistance in hallway  Level of Assistance Contact guard assist, steadying assist  Assistive Device Other (Comment) (HHA)  Distance Ambulated (ft) 275 ft  Activity Response Tolerated well  Mobility Referral Yes  Mobility visit 1 Mobility  Mobility Specialist Start Time (ACUTE ONLY) 1220  Mobility Specialist Stop Time (ACUTE ONLY) 1240  Mobility Specialist Time Calculation (min) (ACUTE ONLY) 20 min    Pre-mobility: BP 124/66 (75) Post-mobility: BP 144/83 (102)  Pt eager for mobility. Required MinG assistance via HHA during ambulation for safety. Pt requested to use BR at BOS, void successful. HR up to 124bpm w/ activity. Took 1x standing rest break d/t fatigued legs, otherwise no c/o. BPs taken before and after mobility. Pt returned back to chair with all needs met. RN notified.   Caesar Bookman Mobility Specialist Please contact via SecureChat or [employer] [phone]

## 2024-03-12 NOTE — Progress Notes (Signed)
  CTA chest/abdomen pelvis showing: IMPRESSION: 1. No evidence of aortic aneurysm or dissection. 2. 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 Distally widely patent. 3. 50-75% stenosis at the origin of the IMA. Distally widely patent. Given the multiple mesenteric stenoses, clinical correlation for signs and symptoms of chronic mesenteric ischemia may be helpful for further management. 4. Status post coronary artery bypass grafting. 5. Moderate sigmoid diverticulosis without superimposed acute inflammatory change.  Plan: -Consulted and discussed case with vascular surgeon Dr. Hetty Blend who recommended repeat CT scan in 48 hours to make sure the celiac dissection is stable.  Recommendation for good blood pressure control goal to systolic blood pressure below 782. If the repeat CT scan showing stable celiac dissection no surgical intervention needed and patient should follow-up with vascular surgery clinic outpatient.  Vascular surgery will arrange for follow-up and will see patient for formal consult in the daytime.Marland Kitchen

## 2024-03-12 NOTE — Evaluation (Signed)
 Occupational Therapy Evaluation Patient Details Name: [name] MRN: [id number] DOB: November 13, 1933 Today's Date: 03/12/2024   History of Present Illness   Pt is an 89 y/o F presenting to ED on 3/29 with chest pain and hypertension. Brief episode of syncope/unresponsiveness with SBP in the 70s upon EMS arrival. CTA chest negative for aortic dissection, but 80% celiac artery stenosis incidental finding. Chronic dissection of L celiac artery. PMH includes CAD, CABG, PAD, HFpEF, HTN, HLD, diabetes, peptic ulcer disease, RA     Clinical Impressions Pt reports ind at baseline with ADLs and functional mobility, lives alone but reports her daughter calls to check on her daily. Pt currently needing CGA- min A for ADLs, and min A for transfers without AD. Pt with mild sway and reaching out for railing support with longer distance ambulation, reports mild dizziness (see BP measures below). Pt presenting with impairments listed below, will follow acutely. Recommend HHOT at d/c.   BP seated in chair pre-mobility 106/72 (81) BP ambulating in hall 112/96 (103) BP seated in chair post-mobility 104/84 (91) BP seated x3 mins 113/65 (74)     If plan is discharge home, recommend the following:   A little help with walking and/or transfers;A little help with bathing/dressing/bathroom;Assistance with cooking/housework;Assist for transportation;Help with stairs or ramp for entrance     Functional Status Assessment   Patient has had a recent decline in their functional status and demonstrates the ability to make significant improvements in function in a reasonable and predictable amount of time.     Equipment Recommendations   None recommended by OT     Recommendations for Other Services   PT consult     Precautions/Restrictions   Precautions Precautions: Fall Precaution/Restrictions Comments: watch BP Restrictions Weight Bearing Restrictions Per Provider Order: No     Mobility Bed  Mobility               General bed mobility comments: in recliner upon arrival and departure    Transfers Overall transfer level: Needs assistance Equipment used: None Transfers: Sit to/from Stand Sit to Stand: Min assist           General transfer comment: mild sway/instability      Balance Overall balance assessment: Needs assistance Sitting-balance support: Feet supported Sitting balance-Leahy Scale: Good     Standing balance support: During functional activity Standing balance-Leahy Scale: Fair Standing balance comment: intermittently reaching for railing in hall                           ADL either performed or assessed with clinical judgement   ADL Overall ADL's : Needs assistance/impaired Eating/Feeding: Set up;Sitting   Grooming: Contact guard assist;Standing   Upper Body Bathing: Minimal assistance   Lower Body Bathing: Minimal assistance   Upper Body Dressing : Minimal assistance   Lower Body Dressing: Minimal assistance   Toilet Transfer: Contact guard assist;Ambulation;Regular Toilet   Toileting- Clothing Manipulation and Hygiene: Contact guard assist       Functional mobility during ADLs: Contact guard assist       Vision   Vision Assessment?: No apparent visual deficits     Perception Perception: Not tested       Praxis Praxis: Not tested       Pertinent Vitals/Pain Pain Assessment Pain Assessment: Faces Pain Score: 3  Faces Pain Scale: Hurts little more Pain Location: back Pain Descriptors / Indicators: Discomfort Pain Intervention(s): Monitored during session, Limited activity within  patient's tolerance, Repositioned     Extremity/Trunk Assessment Upper Extremity Assessment Upper Extremity Assessment: Generalized weakness   Lower Extremity Assessment Lower Extremity Assessment: Defer to PT evaluation   Cervical / Trunk Assessment Cervical / Trunk Assessment: Kyphotic   Communication  Communication Communication: Impaired Factors Affecting Communication: Hearing impaired   Cognition Arousal: Alert Behavior During Therapy: WFL for tasks assessed/performed Cognition: No apparent impairments                               Following commands: Intact       Cueing  General Comments   Cueing Techniques: Verbal cues  See note for BP measures   Exercises     Shoulder Instructions      Home Living Family/patient expects to be discharged to:: Private residence Living Arrangements: Alone Available Help at Discharge: Family;Available PRN/intermittently (daughter) Type of Home: House Home Access: Ramped entrance     Home Layout: One level     Bathroom Shower/Tub: Chief Strategy Officer: Standard Bathroom Accessibility: Yes   Home Equipment: Shower seat   Additional Comments: daughter comes often since January      Prior Functioning/Environment Prior Level of Function : Independent/Modified Independent;Driving             Mobility Comments: no AD use ADLs Comments: difficulty with stepping into/out of the shower, ind with med mgmt    OT Problem List: Decreased strength;Decreased range of motion;Decreased activity tolerance;Impaired balance (sitting and/or standing);Cardiopulmonary status limiting activity;Decreased safety awareness   OT Treatment/Interventions: Self-care/ADL training;Therapeutic exercise;Energy conservation;DME and/or AE instruction;Therapeutic activities;Patient/family education;Balance training      OT Goals(Current goals can be found in the care plan section)   Acute Rehab OT Goals Patient Stated Goal: none stated OT Goal Formulation: With patient Time For Goal Achievement: 03/26/24 Potential to Achieve Goals: Good ADL Goals Pt Will Perform Upper Body Dressing: with modified independence;sitting Pt Will Perform Lower Body Dressing: with modified independence;sitting/lateral leans;sit to/from  stand Pt Will Transfer to Toilet: with modified independence;ambulating;regular height toilet Pt Will Perform Tub/Shower Transfer: Tub transfer;Shower transfer;Independently;ambulating Additional ADL Goal #1: pt will tolerate OOB activity x15 min in order to improve activity tolerance for ADLs   OT Frequency:  Min 2X/week    Co-evaluation              AM-PAC OT "6 Clicks" Daily Activity     Outcome Measure Help from another person eating meals?: A Little Help from another person taking care of personal grooming?: A Little Help from another person toileting, which includes using toliet, bedpan, or urinal?: A Little Help from another person bathing (including washing, rinsing, drying)?: A Little Help from another person to put on and taking off regular upper body clothing?: A Little Help from another person to put on and taking off regular lower body clothing?: A Little 6 Click Score: 18   End of Session Nurse Communication: Mobility status  Activity Tolerance: Patient tolerated treatment well Patient left: in chair;with call bell/phone within reach  OT Visit Diagnosis: Unsteadiness on feet (R26.81);Other abnormalities of gait and mobility (R26.89);Muscle weakness (generalized) (M62.81);History of falling (Z91.81)                Time: 9528-4132 OT Time Calculation (min): 25 min Charges:  OT General Charges $OT Visit: 1 Visit OT Evaluation $OT Eval Moderate Complexity: 1 Mod OT Treatments $Self Care/Home Management : 8-22 mins  Virda Betters K, OTD, OTR/L  SecureChat Preferred Acute Rehab [phone]   Dalphine Handing 03/12/2024, 4:36 PM

## 2024-03-12 NOTE — Progress Notes (Signed)
 PROGRESS NOTE    Patient: [name]                            PCP: Irena Reichmann, DO                    DOB: 1933-05-20            DOA: 03/10/2024 JOA:[id number]             DOS: 03/12/2024, 9:41 AM   LOS: 0 days   Date of Service: The patient was seen and examined on 03/12/2024  Subjective:  The patient was seen and examined this morning, awake alert oriented no acute distress Still labile blood pressure noted, hypertensive this morning 186/78  Finally completed CT angiogram of chest abdomen pelvis to rule out aortic dissection Due to allergies had to be premedicated Tolerated procedure  Brief Narrative:    [name] is a 89 y.o. female with hx of CAD, CABG, PAD, HFpEF, hypertension, hyperlipidemia, diabetes, peptic ulcer disease, RA, who presented due to chest pain.    Reports that recently she has been bothered by dizziness and nausea which is being evaluated by primary care.  She was hypotensive in clinic and her hydralazine was discontinued.  Recently also seen cardiology and noted to be hypotensive and her isosorbide mononitrate was reduced from 60 to 30 mg daily.   Yesterday in the morning she noted that she had severe range hypertension with systolic up to the 240s.  Although her hydralazine had recently been discontinued she took a dose to help reduce her severe range hypertension.  She had noted chest pressure throughout the day which was relatively constant.  Around 7 PM she developed chest pain in the lower central chest which radiated towards her back.  Due to the onset of worsening chest pain she took a tablet of nitroglycerin and repeated this twice.    Family called EMS.  Around this time she had what sounds like a syncopal episode and was briefly unresponsive.  Per report had systolic BP in the 70s prior to EMS arrival.  At time of interview her chest pain has resolved.  She is able to do light housework and denies any exertional symptoms with this amount of  activity.  No dyspnea on exertion.  Is having lightheadedness and dizziness intermittently as well as nausea.  No vomiting or diarrhea.  No other changes in medications other than mentioned above.    Assessment & Plan:   Principal Problem:   Chest pain Active Problems:   CAD (coronary artery disease)   Diabetes mellitus (HCC)   Non-insulin dependent type 2 diabetes mellitus (HCC)   Near syncope   Hx of CABG   Chronic diastolic CHF (congestive heart failure) (HCC)   Polyneuropathy due to type 2 diabetes mellitus (HCC)   Peptic ulcer disease   Type 2 diabetes mellitus with other specified complication (HCC)   Labile hypertension   Assessment/Plan:   Chest pain-abdominal pain -back pain -May be contributed from-mesenteric ischemia -S/p CT angiogram completed 03/11/2024 -no aortic dissection was identified, but 60-70% stenosis of celiac axis, 50-75% stenosis of origin of IMA was noted -Case was discussed overnight with vascular surgery Dr. Hetty Blend -who recommended aggressive control of blood pressure, repeat CT scan in 48 hours -Orders for repeat CT angiogram in 48 hours is in place  -H/o  CAD, CABG, HFpEF and comorbidities -Stable angina -may be related to  episodic accelerated hypertension - not relieved by nitroglycerin at home  -Troponin x 2 negative -Monitoring vitals -No significant changes on EKG-T wave inversion, -Cardiology consulted -following very closely-prescient evaluation and inputs  -As needed nitroglycerin,  -Cardiology reduced dose of Coreg, continue Seroquel, added hydralazine  Gastritis/esophagitis -May be contributing factor due to steroid and methotrexate use - chest x-ray with possible left base infiltrate  w no other signs of infection  -S/p 1 dose of aspirin, continue with Plavix, PPI, GI cocktail as needed    Labile hypertension/ Hypertensive urgency / Recent hypotension Labile blood pressure  -POA: Presyncope/syncope at home 1 episode-likely due  to drop to BP -Systolic BP as high as 234, as low as 70s-  -Etiology still not clear -Appreciate cardiology input-recommending use of hydralazine as needed -continue Coreg at lowest dose 3.125 mg p.o. twice daily,  hydralazine 10 mg p.o. every 8 hours,  Imdur 30 mg p.o. daily  Syncope versus presyncope episode at home -Likely related to vasovagal, labile blood pressure -Will monitor on telemetry -2D echo, cardiac enzymes, CT angiogram all reviewed-aside from recent tract stenosis, no significant aortic dissection or significant findings contributing to syncope     -May benefit from ambulatory blood pressure monitoring   Chronic medical problems:  CAD, CABG/PAD/HLD: Continue home Plavix, rosuvastatin. HFpEF: Without acute exacerbation, not on diuretics at home. Diabetes type 2: Hold home glimepiride. SSI for very sensitive while inpatient. Peptic ulcer disease: See above starting on pantoprazole for empiric treatment of gastritis. Rheumatoid arthritis: On methotrexate weekly on Tuesdays.  Continue home prednisone and 4 mg daily.  Continue home folate   Body mass index is 26.07 kg/m.     ----------------------------------------------------------------------------------------------------------------------------------------------- Nutritional status:  The patient's BMI is: Body mass index is 25.74 kg/m. I agree with the assessment and plan as outlined -----------------------------------------------------------------------------------------------------------------------------------------  DVT prophylaxis:  SCDs Start: 03/11/24 0048   Code Status:   Code Status: Full Code  Family Communication: No family member present at bedside- -Advance care planning has been discussed.   Admission status:   Status is: Observation The patient remains OBS appropriate and will d/c before 2 midnights.   Disposition: From  - home             Planning for discharge in 1 days: to Home    Procedures:   No admission procedures for hospital encounter.   Antimicrobials:  Anti-infectives (From admission, onward)    None        Medication:   alum & mag hydroxide-simeth  30 mL Oral Once   carvedilol  3.125 mg Oral BID WC   clopidogrel  75 mg Oral QHS   [START ON 03/14/2024] diphenhydrAMINE  50 mg Oral Once   Or   [START ON 03/14/2024] diphenhydrAMINE  50 mg Intravenous Once   ezetimibe  10 mg Oral QHS   folic acid  1 mg Oral Daily   gabapentin  400 mg Oral BID   hydrALAZINE  5 mg Intravenous Once   hydrALAZINE  50 mg Oral Q8H   insulin aspart  0-9 Units Subcutaneous Q4H   isosorbide mononitrate  30 mg Oral Daily   LORazepam  0.5 mg Intravenous Once   [START ON 03/14/2024] methylPREDNISolone (SOLU-MEDROL) injection  40 mg Intravenous Once   multivitamin with minerals  1 tablet Oral Daily   pantoprazole  40 mg Oral Daily   rosuvastatin  20 mg Oral Daily   sodium chloride flush  3 mL Intravenous Q12H    acetaminophen, hydrALAZINE, LORazepam, melatonin,  ondansetron (ZOFRAN) IV, polyethylene glycol   Objective:   Vitals:   03/12/24 0025 03/12/24 0400 03/12/24 0748 03/12/24 0826  BP:  (!) 165/64 (!) 195/75 (!) 186/78  Pulse: 74 61    Resp:  15 17   Temp:  97.6 F (36.4 C) 97.6 F (36.4 C)   TempSrc:  Oral Axillary   SpO2: 96% 97%    Weight:  65.9 kg    Height:        Intake/Output Summary (Last 24 hours) at 03/12/2024 0941 Last data filed at 03/12/2024 0757 Gross per 24 hour  Intake 360 ml  Output 1350 ml  Net -990 ml   Filed Weights   03/11/24 0255 03/11/24 0345 03/12/24 0400  Weight: 65.6 kg 65.6 kg 65.9 kg     Physical examination:        General:  AAO x 3,  cooperative, no distress;   HEENT:  Normocephalic, PERRL, otherwise with in Normal limits   Neuro:  CNII-XII intact. , normal motor and sensation, reflexes intact   Lungs:   Clear to auscultation BL, Respirations unlabored,  No wheezes / crackles  Cardio:    S1/S2, RRR, No murmure,  No Rubs or Gallops   Abdomen:  Soft, non-tender, bowel sounds active all four quadrants, no guarding or peritoneal signs.  Muscular  skeletal:  Limited exam -global generalized weaknesses - in bed, able to move all 4 extremities,   2+ pulses,  symmetric, No pitting edema  Skin:  Dry, warm to touch, negative for any Rashes,  Wounds: Please see nursing documentation           ------------------------------------------------------------------------------------------------------------------------------------------    LABs:     Latest Ref Rng & Units 03/11/2024    8:42 AM 03/10/2024    9:00 PM 03/02/2024    4:04 PM  CBC  WBC 4.0 - 10.5 K/uL 4.6  7.1  7.5   Hemoglobin 12.0 - 15.0 g/dL 40.9  81.1  91.4   Hematocrit 36.0 - 46.0 % 34.7  41.0  41.2   Platelets 150 - 400 K/uL 205  228  249       Latest Ref Rng & Units 03/11/2024    8:42 AM 03/10/2024    9:00 PM 03/02/2024    4:04 PM  CMP  Glucose 70 - 99 mg/dL 782  956  213   BUN 8 - 23 mg/dL 19  16  18    Creatinine 0.44 - 1.00 mg/dL 0.86  5.78  4.69   Sodium 135 - 145 mmol/L 136  135  137   Potassium 3.5 - 5.1 mmol/L 4.0  4.6  4.4   Chloride 98 - 111 mmol/L 102  100  97   CO2 22 - 32 mmol/L 26  26  24    Calcium 8.9 - 10.3 mg/dL 9.1  9.2  9.5        Micro Results Recent Results (from the past 240 hours)  MRSA Next Gen by PCR, Nasal     Status: None   Collection Time: 03/10/24  9:16 PM   Specimen: Nasal Mucosa; Nasal Swab  Result Value Ref Range Status   MRSA by PCR Next Gen NOT DETECTED NOT DETECTED Final    Comment: (NOTE) The GeneXpert MRSA Assay (FDA approved for NASAL specimens only), is one component of a comprehensive MRSA colonization surveillance program. It is not intended to diagnose MRSA infection nor to guide or monitor treatment for MRSA infections. Test performance is not FDA approved in patients less  than 36 years old. Performed at [facility], [address], [place], [place] [address]      Radiology Reports CT Angio Chest/Abd/Pel for Dissection W and/or W/WO Result Date: 03/12/2024 CLINICAL DATA:  Acute aortic syndrome, hypertension, chest pain radiating to the back EXAM: CT ANGIOGRAPHY CHEST, ABDOMEN AND PELVIS TECHNIQUE: Non-contrast CT of the chest was initially obtained. Multidetector CT imaging through the chest, abdomen and pelvis was performed using the standard protocol during bolus administration of intravenous contrast. Multiplanar reconstructed images and MIPs were obtained and reviewed to evaluate the vascular anatomy. RADIATION DOSE REDUCTION: This exam was performed according to the departmental dose-optimization program which includes automated exposure control, adjustment of the mA and/or kV according to patient size and/or use of iterative reconstruction technique. CONTRAST:  80mL OMNIPAQUE IOHEXOL 350 MG/ML SOLN COMPARISON:  CT abdomen pelvis 10/19/2022, CT chest 11/17/2019 FINDINGS: CTA CHEST FINDINGS Cardiovascular: Status post coronary artery bypass grafting. Global cardiac size within normal limits. No pericardial effusion. Central pulmonary arteries are of normal caliber. Moderate atherosclerotic calcification within the thoracic aorta. No aortic aneurysm. Mediastinum/Nodes: No enlarged mediastinal, hilar, or axillary lymph nodes. Thyroid gland, trachea, and esophagus demonstrate no significant findings. Lungs/Pleura: Mild bibasilar atelectasis. Lungs are otherwise clear. No pneumothorax or pleural effusion. Musculoskeletal: No chest wall abnormality. No acute or significant osseous findings. Review of the MIP images confirms the above findings. CTA ABDOMEN AND PELVIS FINDINGS VASCULAR Aorta: Normal caliber aorta without aneurysm, dissection, vasculitis or significant stenosis. Advanced mixed atherosclerotic plaque. 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 Distally  widely patent. SMA: Proximally diseased with less than 50% stenosis. Distally widely patent. Renals: Both renal arteries are patent without evidence of aneurysm, dissection, vasculitis, fibromuscular dysplasia or significant stenosis. IMA: 50-75% stenosis at the origin.  Distally widely patent. Inflow: Patent without evidence of aneurysm, dissection, vasculitis or significant stenosis. Extensive atherosclerotic calcification. Veins: No obvious venous abnormality within the limitations of this arterial phase study. Circumaortic left renal vein. Review of the MIP images confirms the above findings. NON-VASCULAR Hepatobiliary: No focal liver abnormality is seen. No gallstones, gallbladder wall thickening, or biliary dilatation. Pancreas: Unremarkable Spleen: Unremarkable Adrenals/Urinary Tract: Adrenal glands are unremarkable. Kidneys are normal, without renal calculi, focal lesion, or hydronephrosis. Bladder is unremarkable. Stomach/Bowel: Moderate sigmoid diverticulosis. Stomach, small bowel, and large bowel are otherwise unremarkable. Appendix normal. No evidence of obstruction or focal inflammation. No free intraperitoneal gas or fluid. Lymphatic: No pathologic adenopathy. Reproductive: Status post hysterectomy. No adnexal masses. Other: No abdominal wall hernia Musculoskeletal: Bilateral total hip arthroplasty. Grade 1-2 anterolisthesis L4-5 with ankylosis of the facet joints. Osseous structures are otherwise age-appropriate. No acute bone abnormality. No lytic or blastic bone lesion. L4 posterior decompression has been performed. Review of the MIP images confirms the above findings. IMPRESSION: 1. No evidence of aortic aneurysm or dissection. 2. 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 Distally widely patent. 3. 50-75% stenosis at the origin of the IMA. Distally widely patent. Given the multiple mesenteric stenoses,  clinical correlation for signs and symptoms of chronic mesenteric ischemia may be helpful for further management. 4. Status post coronary artery bypass grafting. 5. Moderate sigmoid diverticulosis without superimposed acute inflammatory change. Aortic Atherosclerosis (ICD10-I70.0). Electronically Signed   By: Helyn Numbers M.D.   On: 03/12/2024 00:36   ECHOCARDIOGRAM COMPLETE Result Date: 03/11/2024  ECHOCARDIOGRAM REPORT   Patient Name:   [name] Date of Exam: 03/11/2024 Medical Rec #:  [id number]    Height:       63.0 in Accession #:    [id number]   Weight:       144.6 lb Date of Birth:  03/15/1933   BSA:          1.685 m Patient Age:    89 years     BP:           133/81 mmHg Patient Gender: F            HR:           60 bpm. Exam Location:  Inpatient Procedure: 2D Echo, Cardiac Doppler and Color Doppler (Both Spectral and Color            Flow Doppler were utilized during procedure). Indications:    Chest Pain R07.9  History:        Patient has prior history of Echocardiogram examinations, most                 recent 04/14/2023. CHF, Previous Myocardial Infarction, Prior                 CABG; Risk Factors:Diabetes.  Sonographer:    Darlys Gales Referring Phys: [id number] CALLIE E GOODRICH IMPRESSIONS  1. Left ventricular ejection fraction, by estimation, is 65 to 70%. The left ventricle has normal function. The left ventricle has no regional wall motion abnormalities. Left ventricular diastolic parameters are consistent with Grade I diastolic dysfunction (impaired relaxation).  2. Right ventricular systolic function is normal. The right ventricular size is normal.  3. Left atrial size was moderately dilated.  4. The mitral valve is normal in structure. No evidence of mitral valve regurgitation. No evidence of mitral stenosis.  5. The aortic valve is tricuspid. Aortic valve regurgitation is not visualized. No aortic stenosis is present.  6. The inferior vena cava is normal in size with greater than 50%  respiratory variability, suggesting right atrial pressure of 3 mmHg. Comparison(s): No significant change from prior study. Prior images reviewed side by side. FINDINGS  Left Ventricle: Left ventricular ejection fraction, by estimation, is 65 to 70%. The left ventricle has normal function. The left ventricle has no regional wall motion abnormalities. The left ventricular internal cavity size was normal in size. There is  no left ventricular hypertrophy. Abnormal (paradoxical) septal motion consistent with post-operative status. Left ventricular diastolic parameters are consistent with Grade I diastolic dysfunction (impaired relaxation). Normal left ventricular filling pressure. Right Ventricle: The right ventricular size is normal. No increase in right ventricular wall thickness. Right ventricular systolic function is normal. Left Atrium: Left atrial size was moderately dilated. Right Atrium: Right atrial size was normal in size. Pericardium: There is no evidence of pericardial effusion. Mitral Valve: The mitral valve is normal in structure. Mild to moderate mitral annular calcification. No evidence of mitral valve regurgitation. No evidence of mitral valve stenosis. Tricuspid Valve: The tricuspid valve is normal in structure. Tricuspid valve regurgitation is mild. Aortic Valve: The aortic valve is tricuspid. Aortic valve regurgitation is not visualized. No aortic stenosis is present. Aortic valve mean gradient measures 5.0 mmHg. Aortic valve peak gradient measures 6.7 mmHg. Aortic valve area, by VTI measures 2.12 cm. Pulmonic Valve: The pulmonic valve was normal in structure. Pulmonic valve regurgitation is not visualized. No evidence of pulmonic stenosis. Aorta: The aortic root is normal in size and structure. Venous: The  inferior vena cava is normal in size with greater than 50% respiratory variability, suggesting right atrial pressure of 3 mmHg. IAS/Shunts: No atrial level shunt detected by color flow Doppler.   LEFT VENTRICLE PLAX 2D LVIDd:         4.20 cm   Diastology LVIDs:         3.00 cm   LV e' medial:    6.20 cm/s LV PW:         0.90 cm   LV E/e' medial:  10.0 LV IVS:        1.10 cm   LV e' lateral:   6.96 cm/s LVOT diam:     1.95 cm   LV E/e' lateral: 8.9 LV SV:         59 LV SV Index:   35 LVOT Area:     2.99 cm  LEFT ATRIUM             Index        RIGHT ATRIUM          Index LA Vol (A2C):   32.8 ml 19.47 ml/m  RA Area:     9.74 cm LA Vol (A4C):   45.6 ml 27.07 ml/m  RA Volume:   18.30 ml 10.86 ml/m LA Biplane Vol: 40.3 ml 23.92 ml/m  AORTIC VALVE AV Area (Vmax):    2.55 cm AV Area (Vmean):   2.12 cm AV Area (VTI):     2.12 cm AV Vmax:           129.00 cm/s AV Vmean:          102.000 cm/s AV VTI:            0.279 m AV Peak Grad:      6.7 mmHg AV Mean Grad:      5.0 mmHg LVOT Vmax:         110.00 cm/s LVOT Vmean:        72.300 cm/s LVOT VTI:          0.198 m LVOT/AV VTI ratio: 0.71  AORTA Ao Root diam: 3.10 cm MITRAL VALVE                TRICUSPID VALVE MV Area (PHT): 1.86 cm     TR Peak grad:   20.6 mmHg MV Decel Time: 408 msec     TR Vmax:        227.00 cm/s MV E velocity: 61.80 cm/s MV A velocity: 100.00 cm/s  SHUNTS MV E/A ratio:  0.62         Systemic VTI:  0.20 m                             Systemic Diam: 1.95 cm Rachelle Hora Croitoru MD Electronically signed by Thurmon Fair MD Signature Date/Time: 03/11/2024/1:56:27 PM    Final     SIGNED: Kendell Bane, MD, FHM. FAAFP. [facility] - [facility] Time spent - 55 min.  In seeing, evaluating and examining the patient. Reviewing medical records, labs, drawn plan of care. [facility],  Pager (please use [email or web address] to page/ text) Please use Epic Secure Chat for non-urgent communication (7AM-7PM)  If 7PM-7AM, please contact night-coverage [email or web address], 03/12/2024, 9:41 AM

## 2024-03-12 NOTE — Inpatient Diabetes Management (Addendum)
 Inpatient Diabetes Program Recommendations  AACE/ADA: New Consensus Statement on Inpatient Glycemic Control (2015)  Target Ranges:  Prepandial:   less than 140 mg/dL      Peak postprandial:   less than 180 mg/dL (1-2 hours)      Critically ill patients:  140 - 180 mg/dL   Lab Results  Component Value Date   GLUCAP 273 (H) 03/12/2024   HGBA1C 8.8 (H) 03/11/2024    Review of Glycemic Control  Latest Reference Range & Units 03/11/24 16:42 03/11/24 21:13 03/12/24 00:20 03/12/24 04:18 03/12/24 07:54 03/12/24 10:03  Glucose-Capillary 70 - 99 mg/dL 098 (H) 119 (H) 147 (H) 153 (H) 278 (H) 273 (H)  (H): Data is abnormally high Diabetes history: Type 2 DM Outpatient Diabetes medications: Amaryl 2 mg QD Current orders for Inpatient glycemic control: Novolog 0-9 units Q4H Prednisone 4 mg x 1 Solumedrol 40 mg x2  Inpatient Diabetes Program Recommendations:    In the setting of steroids, consider adding Novolog 3 units TID (Assuming patient consuming >50% of meals) and change correction to TID & HS.  Thanks, Lujean Rave, MSN, RNC-OB Diabetes Coordinator [phone] (8a-5p)

## 2024-03-12 NOTE — Progress Notes (Signed)
 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  Vascular surgery joining care team.  Patient has much to clarify: She took her hydralazine PTA because of symptomatic HTN emergency (SBP 210 with chest pain).  Does not remember syncopal issues Presently Noo chest pain.  Does not back pain.    Vital Signs    Vitals:   03/12/24 0024 03/12/24 0025 03/12/24 0400 03/12/24 0748  BP:   (!) 165/64 (!) 195/75  Pulse: 86 74 61   Resp:   15 17  Temp:   97.6 F (36.4 C) 97.6 F (36.4 C)  TempSrc:   Oral Axillary  SpO2: 95% 96% 97%   Weight:   65.9 kg   Height:        Intake/Output Summary (Last 24 hours) at 03/12/2024 0755 Last data filed at 03/12/2024 1610 Gross per 24 hour  Intake 240 ml  Output 1350 ml  Net -1110 ml   Filed Weights   03/11/24 0255 03/11/24 0345 03/12/24 0400  Weight: 65.6 kg 65.6 kg 65.9 kg    Physical Exam   GEN: No acute distress.   Neck: L Carotid Bruit Cardiac: RRR, no murmurs, rubs, or gallops.  Respiratory: Clear to auscultation bilaterally. GI: Soft, nontender, non-distended  MS: No edema  Labs   Telemetry: SR   Chemistry Recent Labs  Lab 03/10/24 2100 03/11/24 0842  NA 135 136  K 4.6 4.0  CL 100 102  CO2 26 26  GLUCOSE 262* 218*  BUN 16 19  CREATININE 1.19* 1.16*  CALCIUM 9.2 9.1  GFRNONAA 44* 45*  ANIONGAP 9 8     Hematology Recent Labs  Lab 03/10/24 2100 03/11/24 0842  WBC 7.1 4.6  RBC 4.58 3.90  HGB 12.3 10.7*  HCT 41.0 34.7*  MCV 89.5 89.0  MCH 26.9 27.4  MCHC 30.0 30.8  RDW 13.9 14.1  PLT 228 205     Cardiac Studies   Cardiac Studies & Procedures   ______________________________________________________________________________________________ CARDIAC CATHETERIZATION  CARDIAC CATHETERIZATION 11/01/2016  Narrative Images from the original result were not included.   LV end  diastolic pressure is mildly elevated. In the setting of systemic hypertension  SVG-diagonal is moderate in size. Origin lesion, 55 %stenosed.  LM lesion, 55 %stenosed.  Ost LAD lesion, 100 %stenosed.  Prox LAD to Mid LAD lesion, 100 %stenosed. After D1.  Very small caliber Ost Ramus lesion, 70 %stenosed.  Ost Cx to Prox Cx lesion, 80 %stenosed. Prox Cx lesion, 70 %stenosed.  SVG-OM is large and anatomically normal. The flow in the graft is reversed. The bifurcating Cx OM is relatively normal.  Very tortuous L Subclavian artery - unable to perform L Radial Cath.   Patient has relatively stable coronary disease with maybe mild progression of the vein graft to the diagonal branch ostial disease that was maybe 40% previously. This does not appear to be enough flow limiting to cause resting symptoms, however most likely etiology is accelerated hypertension, accelerated LVEDP and microvascular disease with a small ramus branch and other branches from the native system.  PLAN:  Admit to step down for ongoing blood pressure control. Will use IV nitroglycerin overnight. I will increase carvedilol to 6.25 twice a day and add amlodipine.  Continue aspirin, Plavix and statin  Can stop IV heparin   Continue medical management otherwise. Will restart Imdur once her nitroglycerin is weaned off.  Bryan Lemma, M.D., M.S. Interventional Cardiologist  Pager # [phone] Phone # [phone] [address] [place], [place] [address]  Findings Coronary Findings Diagnostic  Dominance: Right  Left Main The lesion is located at the major branch and tubular.  Left Anterior Descending The lesion is chronically occluded. Just after a proximal septal perforator The lesion is chronically occluded.  First Septal Branch Vessel is small in size.  Second Diagonal Branch Vessel is small in size.  Second Septal Branch Vessel is small in size.  Third Diagonal Branch Vessel  is small in size.  Third Septal Branch Vessel is small in size.  Ramus Intermedius Vessel is small. The lesion is smooth. Very small caliber ramus  Left Circumflex There is mild  diffuse disease throughout the vessel. The lesion is irregular. The lesion is calcified. Chronic The lesion is irregular. The lesion is calcified. Chronic  Second Obtuse Marginal Branch Vessel is small in size.  Right Coronary Artery Vessel is large. Vessel is angiographically normal.  Acute Marginal Branch Vessel is small in size.  Right Posterior Descending Artery Vessel is moderate in size.  Inferior Septal Vessel is small in size.  Right Posterior Atrioventricular Artery Vessel is moderate in size. Vessel is angiographically normal.  First Right Posterolateral Branch Vessel is small in size. Vessel is angiographically normal.  Second Right Posterolateral Branch Vessel is moderate in size. Vessel is angiographically normal.  LIMA LIMA Graft To Dist LAD LIMA and is normal in caliber and anatomically normal.  Saphenous Graft To 1st Diag SVG and is moderate in size. The lesion is tubular and smooth.  Saphenous Graft To Lat 1st Mrg SVG and is large and anatomically normal. The flow in the graft is reversed.  Intervention  No interventions have been documented.   STRESS TESTS  MYOCARDIAL PERFUSION IMAGING 02/08/2022  Narrative   Normal blood pressure and normal heart rate response noted during stress. Heart rate recovery was normal.   No ST deviation was noted. There were no arrhythmias during recovery.   LV perfusion is normal. There is no evidence of ischemia. There is no evidence of infarction.   Left ventricular function is abnormal. Nuclear stress EF: 61 %. The left ventricular ejection fraction is normal (55-65%). End diastolic cavity size is normal. End systolic cavity size is normal. Evidence of transient ischemic dilation (TID) noted. TID was appreciated quantitatively but  not visually.   The study is normal. Findings are consistent with no prior ischemia. The study is low risk.   ECHOCARDIOGRAM  ECHOCARDIOGRAM COMPLETE 03/11/2024  Narrative ECHOCARDIOGRAM REPORT    Patient Name:   [name] Date of Exam: 03/11/2024 Medical Rec #:  [id number]    Height:       63.0 in Accession #:    [id number]   Weight:       144.6 lb Date of Birth:  February 21, 1933   BSA:          1.685 m Patient Age:    89 years     BP:           133/81 mmHg Patient Gender: F            HR:           60 bpm. Exam Location:  Inpatient  Procedure: 2D Echo, Cardiac Doppler and Color Doppler (Both Spectral and Color Flow Doppler were utilized during procedure).  Indications:    Chest Pain R07.9  History:        Patient  has prior history of Echocardiogram examinations, most recent 04/14/2023. CHF, Previous Myocardial Infarction, Prior CABG; Risk Factors:Diabetes.  Sonographer:    Darlys Gales Referring Phys: [id number] CALLIE E GOODRICH  IMPRESSIONS   1. Left ventricular ejection fraction, by estimation, is 65 to 70%. The left ventricle has normal function. The left ventricle has no regional wall motion abnormalities. Left ventricular diastolic parameters are consistent with Grade I diastolic dysfunction (impaired relaxation). 2. Right ventricular systolic function is normal. The right ventricular size is normal. 3. Left atrial size was moderately dilated. 4. The mitral valve is normal in structure. No evidence of mitral valve regurgitation. No evidence of mitral stenosis. 5. The aortic valve is tricuspid. Aortic valve regurgitation is not visualized. No aortic stenosis is present. 6. The inferior vena cava is normal in size with greater than 50% respiratory variability, suggesting right atrial pressure of 3 mmHg.  Comparison(s): No significant change from prior study. Prior images reviewed side by side.  FINDINGS Left Ventricle: Left ventricular ejection fraction, by estimation, is  65 to 70%. The left ventricle has normal function. The left ventricle has no regional wall motion abnormalities. The left ventricular internal cavity size was normal in size. There is no left ventricular hypertrophy. Abnormal (paradoxical) septal motion consistent with post-operative status. Left ventricular diastolic parameters are consistent with Grade I diastolic dysfunction (impaired relaxation). Normal left ventricular filling pressure.  Right Ventricle: The right ventricular size is normal. No increase in right ventricular wall thickness. Right ventricular systolic function is normal.  Left Atrium: Left atrial size was moderately dilated.  Right Atrium: Right atrial size was normal in size.  Pericardium: There is no evidence of pericardial effusion.  Mitral Valve: The mitral valve is normal in structure. Mild to moderate mitral annular calcification. No evidence of mitral valve regurgitation. No evidence of mitral valve stenosis.  Tricuspid Valve: The tricuspid valve is normal in structure. Tricuspid valve regurgitation is mild.  Aortic Valve: The aortic valve is tricuspid. Aortic valve regurgitation is not visualized. No aortic stenosis is present. Aortic valve mean gradient measures 5.0 mmHg. Aortic valve peak gradient measures 6.7 mmHg. Aortic valve area, by VTI measures 2.12 cm.  Pulmonic Valve: The pulmonic valve was normal in structure. Pulmonic valve regurgitation is not visualized. No evidence of pulmonic stenosis.  Aorta: The aortic root is normal in size and structure.  Venous: The inferior vena cava is normal in size with greater than 50% respiratory variability, suggesting right atrial pressure of 3 mmHg.  IAS/Shunts: No atrial level shunt detected by color flow Doppler.   LEFT VENTRICLE PLAX 2D LVIDd:         4.20 cm   Diastology LVIDs:         3.00 cm   LV e' medial:    6.20 cm/s LV PW:         0.90 cm   LV E/e' medial:  10.0 LV IVS:        1.10 cm   LV e'  lateral:   6.96 cm/s LVOT diam:     1.95 cm   LV E/e' lateral: 8.9 LV SV:         59 LV SV Index:   35 LVOT Area:     2.99 cm   LEFT ATRIUM             Index        RIGHT ATRIUM          Index LA Vol (A2C):   32.8 ml  19.47 ml/m  RA Area:     9.74 cm LA Vol (A4C):   45.6 ml 27.07 ml/m  RA Volume:   18.30 ml 10.86 ml/m LA Biplane Vol: 40.3 ml 23.92 ml/m AORTIC VALVE AV Area (Vmax):    2.55 cm AV Area (Vmean):   2.12 cm AV Area (VTI):     2.12 cm AV Vmax:           129.00 cm/s AV Vmean:          102.000 cm/s AV VTI:            0.279 m AV Peak Grad:      6.7 mmHg AV Mean Grad:      5.0 mmHg LVOT Vmax:         110.00 cm/s LVOT Vmean:        72.300 cm/s LVOT VTI:          0.198 m LVOT/AV VTI ratio: 0.71  AORTA Ao Root diam: 3.10 cm  MITRAL VALVE                TRICUSPID VALVE MV Area (PHT): 1.86 cm     TR Peak grad:   20.6 mmHg MV Decel Time: 408 msec     TR Vmax:        227.00 cm/s MV E velocity: 61.80 cm/s MV A velocity: 100.00 cm/s  SHUNTS MV E/A ratio:  0.62         Systemic VTI:  0.20 m Systemic Diam: 1.95 cm  Mihai Croitoru MD Electronically signed by Thurmon Fair MD Signature Date/Time: 03/11/2024/1:56:27 PM    Final          ______________________________________________________________________________________________       Assessment & Plan   Atypical Chest pain with celiac dissection and chronic mesenteric ischemia - vascular surgery consult pending today; this may be the cause of her discomfort  Labile hypertension - BP is still labile - given significant hypotension ( BP 210-> 70s) prior to initial consult will cautiously increase hydralazine dose - with syncope in the setting of BP drop; this has made her dissection care challenging - given 25 mg of Hydralazine now, with additional 50 mg - if BP continues to be elevated, we will have to attempt nitroglycerin drip and closely monitor BP - medication over-exuberance, and autonomic  dysfunction related to PAD and DM may be cause of labile BP; no evidence of RAS   Memory issues - patient has intracranial atherosclerosis seen on 11/2023 CT - she has had some struggles with remembering her full care; no prior diagnosis of vascular dementia; will monitor  CAD s/p CABG - planned for PET MPI as outpatient - contineu current medications and working toward improved BP control - Echo unchanged - cortisol WNL, TSH pending   PAD and CAS - vascular seeing today; likely stable disease, with planned outpatient follow up  Bradycardia - coreg dose lowered 3/30; bradycardia resolved  HLD - on zetia and statin  Anemia - new from 3/30; labs from today pending  CKD IIIa with T2 DM - stable  For questions or updates, please contact [facility] Please consult [email or web address] for contact info under Cardiology/STEMI.      Riley Lam, MD FASE [facility] Cardiologist [facility]  [address] [place], [address] [place], [place] [address] [phone]  7:55 AM

## 2024-03-12 NOTE — Consult Note (Signed)
 Hospital Consult    Reason for Consult: Celiac artery stenosis/dissection Requesting Service/Physician: Hospital medicine  MRN #:  [id number]  History of Present Illness: This is a 89 y.o. female with multiple medical comorbidities including CAD, CABG, HFpEF, HTN, HLD and diabetes who presented yesterday with chest pain.  She was found to be severely hypertensive and was admitted for blood pressure control and a cardiac workup.  A CTA was obtained which demonstrated a celiac stenosis of approximately 80% with a possible short segment focal dissection.  She denies any postprandial pain although has had some constipation.  She denies food fear or significant abdominal pain.  Past Medical History:  Diagnosis Date   Allergy to IVP dye    Angina, class I (HCC) 04/22/2015   Atherosclerotic heart disease native coronary artery w/angina pectoris (HCC) 1992   a. 1992 s/p PTCA of LAD;  b. 1998 CABG x 3; c. 07/2001 Cath: Sev LM/LAD/LCX dzs, 3/3 patent grafts; d. 11/2013 Cath: stable graft anatomy; e. 10/2016 Cath: native 3VD, VG->OM nl, LIMA->LAD nl, VG->Diag 55.   Atherosclerotic heart disease of native coronary artery with angina pectoris (HCC) 01/14/2010   Qualifier: Diagnosis of  By: Arlyce Dice MD, Barbette Hair    Atrial septal aneurysm    Barrett's esophagus 03/04/2010   Qualifier: Diagnosis of  By: Myrtie Hawk, Amy S    Bilateral leg edema 04/22/2015   Bradycardia, mild briefly to the 40s, asymptomatic 05/16/2013   Carotid arterial disease (HCC)    a. 05/2014 Carotid U/S: No signif bilat ICA stenosis, >60% L ECA.   Chronic anemia    Chronic diastolic CHF (congestive heart failure) (HCC)    a. 10/2016 Echo: Ef 55-60%, no rwma, Gr1 DD, triv AI, PASP , atrial septal aneurysm.   Chronic diastolic CHF (congestive heart failure), NYHA class 2 (HCC) 08/11/2017   Claudication (HCC) 05/16/2013   Peripheral arterial occlusive disease with lifestyle limiting claudication    Degenerative joint disease  05/30/2018   DIVERTICULOSIS OF COLON 01/14/2010   Qualifier: Diagnosis of  By: Arlyce Dice MD, Barbette Hair    DM (diabetes mellitus), type 2 with peripheral vascular complications (HCC)    On Oral medications.   DOE (dyspnea on exertion) 04/22/2015   Essential hypertension    FECAL INCONTINENCE 03/04/2010   Qualifier: Diagnosis of  By: Monica Becton PA-c, Amy S    GERD (gastroesophageal reflux disease)    Hyperlipidemia with target LDL less than 70    Inflammatory arthritis 10/13/2017   Neuropathy    Non-insulin dependent type 2 diabetes mellitus (HCC) 10/24/2015   NSTEMI (non-ST elevated myocardial infarction) (HCC) 11/01/2016   PAD (peripheral artery disease) (HCC) 05/2013; 09/2013   a. severe calcified POP-1 & TP Trunk Dz bilat;  b . 05/2013 Diamondback Rot Athrectomy (R POP) --> PTA R Pop, DES to R Peroneal;  c. 09/2013 PTA/Stenting of L Pop Clemencia Course - retrograde access);  d. 04/2014 ABI's: R/L: 1.1/1.1.   Preoperative cardiovascular examination 04/15/2020   PUD (peptic ulcer disease)    Rheumatoid arthritis (HCC)    S/P CABG x 3 1998   LIMA-LAD, SVG-OM, SVG-D1   Severe claudication (HCC) 05/2013   Referred for Peripheral Angio (Dr. Allyson Sabal --> Dr. Hoy Finlay in [place])   [facility] ALLERGY 01/14/2010   Qualifier: Diagnosis of  By: Candice Camp CMA (AAMA), [name]     Status post total replacement of right hip 10/12/2018   Vertigo 01/02/2014    Past Surgical History:  Procedure Laterality Date   ATHERECTOMY Right 05/15/2013  Procedure: ATHERECTOMY;  Surgeon: Runell Gess, MD;  Location: [facility] CATH LAB;  Service: Cardiovascular;  Laterality: Right;  popliteal   BACK SURGERY     CARDIAC CATHETERIZATION  07/17/01   2 v CAD with LM, circ, LAD, obtuse diag, mod stenosis of diag vein graft , mild stenosis of marg vein graft, nl EF, medical treatment   CARDIAC CATHETERIZATION  11/2013   Widely patent LIMA-LAD & SVG-OM with mild progression of proximal stenosis ~40-50% in SVG-D1; Widely patent native  RCA with known severe native LCA disease   CARDIAC CATHETERIZATION N/A 11/01/2016   Procedure: Left Heart Cath and Cors/Grafts Angiography;  Surgeon: [name], MD;  Location: [facility] INVASIVE CV LAB;  Service: Cardiovascular: Hemodynamics: High LVEDP!.  Cors/Grafts: LM 55%, o-p LAD 100% then after D1 -> LIMA-LAD patent, SVG-Diag ~55%. small RI wiht ~70% ostial. O-p Cx 80% - pCx 70% --> SVG-bifurcating OM patent. -- med Rx   CORONARY ANGIOPLASTY  1992   PCI to LAD   CORONARY ARTERY BYPASS GRAFT  1998   LIMA-LAD, SVG-diagonal (none roughly 50% stenosis), SVG-OM   LEFT HEART CATHETERIZATION WITH CORONARY ANGIOGRAM N/A 11/27/2013   Procedure: LEFT HEART CATHETERIZATION WITH CORONARY ANGIOGRAM;  Surgeon: [name], MD;  Location: [facility] CATH LAB;  Service: Cardiovascular;  Laterality: N/A;   LOW EXTREMITY DOPPLERS/ABI  10/2016   Patent right SFA, popliteal and peroneal tendons. Patent left SFA and popliteal stent. 30-50% stenosis in the right common femoral and popliteal arteries, 50-74% stenosis in left profunda femoris artery. --> 1 year follow-up.  RABI - 1.2, LABI 1.1   LOWER EXTREMITY ANGIOGRAM N/A 02/22/2013   Procedure: LOWER EXTREMITY ANGIOGRAM;  Surgeon: Runell Gess, MD;  Location: [facility] CATH LAB;  Service: Cardiovascular;  Laterality: N/A;   LOWER EXTREMITY ANGIOGRAM N/A 07/20/2013   Procedure: LOWER EXTREMITY ANGIOGRAM;  Surgeon: Runell Gess, MD;  Location: [facility] CATH LAB;  Service: Cardiovascular;  Laterality: N/A;   NM MYOVIEW LTD  04/03/2013; 5/26'/2016   Lexiscan: a)  Apical perfusion defect with no ischemia. Consider prior infarct versus breast attenuation.;; b) 5/'16: LOW RISK, Nl EF ~55-65%, No Ischemia /Infarction   NM MYOVIEW LTD  10/'19; 4/'21   a) LOW RISK.  EF 55%.  No ischemia or infarction. b) EF estimated 81%.  Suboptimal images.  May be subtle inferior ischemia-initially read by radiology as intermediate risk, however overread by cardiology to be mild low risk.Marland Kitchen    PERCUTANEOUS STENT INTERVENTION Right 05/15/2013   Procedure: PERCUTANEOUS STENT INTERVENTION;  Surgeon: Runell Gess, MD;  Location: [facility] CATH LAB;  Service: Cardiovascular;  Laterality: Right;  tibioperoneal trunk   Peroneal Artery Stent  05/15/13   Diamondback rot. atherectomy of high grade segmental popliteal stenosis then Chocolate balloonPTA; then angiosculpt PTA of the prox peroneal with stent-Xpedition    pv angio  07/20/13   high-grade calcified popliteal disease with one-vessel runoff via a small anterior tibial artery that has proximal disease as well, no intervention   TOTAL HIP ARTHROPLASTY Right 10/12/2018   Procedure: RIGHT TOTAL HIP ARTHROPLASTY ANTERIOR APPROACH;  Surgeon: Kathryne Hitch, MD;  Location: [facility] ORS;  Service: Orthopedics;  Laterality: Right;   TOTAL HIP ARTHROPLASTY Left 11/21/2020   Procedure: LEFT TOTAL HIP ARTHROPLASTY ANTERIOR APPROACH;  Surgeon: Kathryne Hitch, MD;  Location: [facility] ORS;  Service: Orthopedics;  Laterality: Left;   TRANSTHORACIC ECHOCARDIOGRAM  11/'17; 10/19   a) EF 55-60%. Gr 1 DD. Normal PAP. Aortic Sclerosis.;; b) Normal LV size and function  with vigorous EF 65 to 70%.  GR 1 DD.  Severe LA dilation (suggestive of probably worsening: GR 1 DD).   TRANSTHORACIC ECHOCARDIOGRAM  03/18/2020   EF 70-75%.  Hyperdynamic.  GR 1 DD.  No R WMA.  Mild LA dilation.  Mild aortic valve sclerosis but no stenosis.-No change from prior echo   TUBAL LIGATION      Allergies  Allergen Reactions   Fish Allergy Hives and Shortness Of Breath   Iodinated Contrast Media Shortness Of Breath and Anaphylaxis    Other reaction(s): Unknown   Latex Anaphylaxis, Hives, Shortness Of Breath, Itching and Other (See Comments)    REACTION: wheezing Other reaction(s): Unknown   Shellfish Allergy Anaphylaxis, Hives, Shortness Of Breath and Other (See Comments)    All seafood   Amlodipine Swelling   Evolocumab Other (See Comments)    Other reaction(s): latex on  syringe Unknown reaction   Other Itching and Other (See Comments)    Patient reports allergy to perfumed detergents   Metformin Nausea And Vomiting    Prior to Admission medications   Medication Sig Start Date End Date Taking? Authorizing Provider  acetaminophen (TYLENOL) 650 MG CR tablet Take 650 mg by mouth daily as needed for pain.   Yes [provider]  carvedilol (COREG) 6.25 MG tablet TAKE 1 TABLET BY MOUTH 2 TIMES DAILY WITH A MEAL. 01/19/24  Yes Runell Gess, MD  Cholecalciferol (VITAMIN D3) 10 MCG (400 UNIT) tablet Take 400 Units by mouth daily.   Yes [provider]  clopidogrel (PLAVIX) 75 MG tablet Take 75 mg by mouth at bedtime.   Yes [provider]  denosumab (PROLIA) 60 MG/ML SOSY injection Inject 60 mg into the skin every 6 (six) months.   Yes [provider]  ezetimibe (ZETIA) 10 MG tablet Take 10 mg by mouth at bedtime.   Yes [provider]  folic acid (FOLVITE) 1 MG tablet Take 1 mg by mouth daily. 08/21/18  Yes [provider]  gabapentin (NEURONTIN) 400 MG capsule Take 400 mg by mouth 2 (two) times daily. 09/07/22  Yes [provider]  glimepiride (AMARYL) 2 MG tablet Take 2 mg by mouth daily with breakfast. 01/07/22  Yes [provider]  isosorbide mononitrate (IMDUR) 30 MG 24 hr tablet Take 1 tablet (30 mg total) by mouth daily. 02/28/24 05/28/24 Yes Jonita Albee, PA-C  LORazepam (ATIVAN) 0.5 MG tablet 1 tablet Orally Once a day as needed for anxiety for 90 days 10/04/23  Yes [provider]  methotrexate (RHEUMATREX) 2.5 MG tablet Take 20 mg by mouth every Tuesday. Take 8 tablets by mouth every Tuesday  Caution:Chemotherapy. Protect from light.   Yes [provider]  Multiple Vitamin (MULTIVITAMIN) capsule Take 1 capsule by mouth daily.   Yes [provider]  nitroGLYCERIN (NITROSTAT) 0.4 MG SL tablet PLACE 1 TABLET UNDER THE TONGUE EVERY 5 MINUTES AS NEEDED FOR  CHEST PAIN. 12/30/23  Yes Runell Gess, MD  ondansetron (ZOFRAN-ODT) 8 MG disintegrating tablet Take 8 mg by mouth every 8 (eight) hours as needed for nausea. 01/11/24  Yes [provider]  predniSONE (DELTASONE) 1 MG tablet Take 4 mg by mouth daily. 05/02/23  Yes [provider]  rosuvastatin (CRESTOR) 20 MG tablet Take 20 mg by mouth daily. 11/11/23  Yes [provider]    Social History   Socioeconomic History   Marital status: Married    Spouse name: Not on file   Number of  children: Not on file   Years of education: Not on file   Highest education level: Not on file  Occupational History   Occupation: retired  Tobacco Use   Smoking status: Never   Smokeless tobacco: Never  Vaping Use   Vaping status: Never Used  Substance and Sexual Activity   Alcohol use: Not Currently   Drug use: No   Sexual activity: Not on file  Other Topics Concern   Not on file  Social History Narrative   Right handed   Lives alone ( husband in rehab facility)   Caffeine- mostly coffee       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   Social Drivers of Health   Financial Resource Strain: Not on file  Food Insecurity: No Food Insecurity (03/11/2024)   Hunger Vital Sign    Worried About Running Out of Food in the Last Year: Never true    Ran Out of Food in the Last Year: Never true  Transportation Needs: No Transportation Needs (03/11/2024)   PRAPARE - Administrator, Civil Service (Medical): No    Lack of Transportation (Non-Medical): No  Physical Activity: Not on file  Stress: Not on file  Social Connections: Socially Integrated (03/11/2024)   Social Connection and Isolation Panel [NHANES]    Frequency of Communication with Friends and Family: More than three times a week    Frequency of Social Gatherings with Friends and Family: Twice a week    Attends Religious Services: More than 4 times per year    Active Member of [employer] or Organizations: No    Attends Hospital doctor: More than 4 times per year    Marital Status: Married  Catering manager Violence: Not At Risk (03/11/2024)   Humiliation, Afraid, Rape, and Kick questionnaire    Fear of Current or Ex-Partner: No    Emotionally Abused: No    Physically Abused: No    Sexually Abused: No    Family History  Adopted: Yes  Problem Relation Age of Onset   Hypertension Mother    Hyperlipidemia Mother    Hyperlipidemia Father    Hypertension Father     ROS: Otherwise negative unless mentioned in HPI  Physical Examination  Vitals:   03/12/24 0826 03/12/24 1018  BP: (!) 186/78 (!) 146/89  Pulse:    Resp:    Temp:    SpO2:     Body mass index is 25.74 kg/m.  General: no acute distress Cardiac: hemodynamically stable, hypertensive to 180s Pulm: normal work of breathing Abdomen: non-tender, no pulsatile mass  Neuro: alert, no focal deficit Vascular:   Right: Palpable femoral  Left: Palpable femoral   Data:   CTA independently reviewed No acute aortic pathology Moderate calcific disease of the visceral vessels with luminal approximately 80% celiac stenosis, possible dissection but not confirmed.  Less than 50% stenosis of the SMA and bilateral renals Significant medial calcinosis of the bilateral iliac systems but patent  MRA from 2021 reviewed There was a severe stenosis of the celiac artery at that time as pictured below    ASSESSMENT/PLAN: This is a 89 y.o. female who presented with acute onset of chest pain and a cardiac workup and a CTA was obtained which demonstrated an incidental finding of an approximate 80% celiac stenosis and a possible focal short segment dissection. 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  I do not believe that her chronic celiac stenosis would be the cause of her acute chest pain and we will plan for outpatient follow-up in our office  in 4 to 6 weeks with a mesenteric duplex.   Daria Pastures MD [facility] [phone] 03/12/2024  11:04 AM

## 2024-03-13 ENCOUNTER — Other Ambulatory Visit: Payer: Self-pay | Admitting: Internal Medicine

## 2024-03-13 ENCOUNTER — Ambulatory Visit (HOSPITAL_COMMUNITY)

## 2024-03-13 ENCOUNTER — Ambulatory Visit (HOSPITAL_COMMUNITY): Admission: RE | Admit: 2024-03-13 | Source: Ambulatory Visit | Attending: Cardiology | Admitting: Cardiology

## 2024-03-13 DIAGNOSIS — I771 Stricture of artery: Secondary | ICD-10-CM | POA: Diagnosis present

## 2024-03-13 DIAGNOSIS — M069 Rheumatoid arthritis, unspecified: Secondary | ICD-10-CM | POA: Diagnosis present

## 2024-03-13 DIAGNOSIS — R55 Syncope and collapse: Secondary | ICD-10-CM | POA: Diagnosis present

## 2024-03-13 DIAGNOSIS — R072 Precordial pain: Secondary | ICD-10-CM | POA: Diagnosis not present

## 2024-03-13 DIAGNOSIS — I130 Hypertensive heart and chronic kidney disease with heart failure and stage 1 through stage 4 chronic kidney disease, or unspecified chronic kidney disease: Secondary | ICD-10-CM | POA: Diagnosis present

## 2024-03-13 DIAGNOSIS — I2510 Atherosclerotic heart disease of native coronary artery without angina pectoris: Secondary | ICD-10-CM

## 2024-03-13 DIAGNOSIS — Z602 Problems related to living alone: Secondary | ICD-10-CM | POA: Diagnosis present

## 2024-03-13 DIAGNOSIS — I6523 Occlusion and stenosis of bilateral carotid arteries: Secondary | ICD-10-CM | POA: Diagnosis present

## 2024-03-13 DIAGNOSIS — D631 Anemia in chronic kidney disease: Secondary | ICD-10-CM | POA: Diagnosis present

## 2024-03-13 DIAGNOSIS — N1832 Chronic kidney disease, stage 3b: Secondary | ICD-10-CM | POA: Diagnosis present

## 2024-03-13 DIAGNOSIS — R54 Age-related physical debility: Secondary | ICD-10-CM | POA: Diagnosis present

## 2024-03-13 DIAGNOSIS — T380X5A Adverse effect of glucocorticoids and synthetic analogues, initial encounter: Secondary | ICD-10-CM | POA: Diagnosis not present

## 2024-03-13 DIAGNOSIS — Z79631 Long term (current) use of antimetabolite agent: Secondary | ICD-10-CM | POA: Diagnosis not present

## 2024-03-13 DIAGNOSIS — E1122 Type 2 diabetes mellitus with diabetic chronic kidney disease: Secondary | ICD-10-CM | POA: Diagnosis present

## 2024-03-13 DIAGNOSIS — E1165 Type 2 diabetes mellitus with hyperglycemia: Secondary | ICD-10-CM | POA: Diagnosis present

## 2024-03-13 DIAGNOSIS — K279 Peptic ulcer, site unspecified, unspecified as acute or chronic, without hemorrhage or perforation: Secondary | ICD-10-CM | POA: Diagnosis present

## 2024-03-13 DIAGNOSIS — I708 Atherosclerosis of other arteries: Secondary | ICD-10-CM | POA: Diagnosis present

## 2024-03-13 DIAGNOSIS — E1143 Type 2 diabetes mellitus with diabetic autonomic (poly)neuropathy: Secondary | ICD-10-CM | POA: Diagnosis present

## 2024-03-13 DIAGNOSIS — I160 Hypertensive urgency: Secondary | ICD-10-CM | POA: Diagnosis present

## 2024-03-13 DIAGNOSIS — K2970 Gastritis, unspecified, without bleeding: Secondary | ICD-10-CM | POA: Diagnosis present

## 2024-03-13 DIAGNOSIS — R079 Chest pain, unspecified: Secondary | ICD-10-CM | POA: Diagnosis not present

## 2024-03-13 DIAGNOSIS — Z951 Presence of aortocoronary bypass graft: Secondary | ICD-10-CM | POA: Diagnosis not present

## 2024-03-13 DIAGNOSIS — E1151 Type 2 diabetes mellitus with diabetic peripheral angiopathy without gangrene: Secondary | ICD-10-CM | POA: Diagnosis present

## 2024-03-13 DIAGNOSIS — I5032 Chronic diastolic (congestive) heart failure: Secondary | ICD-10-CM | POA: Diagnosis present

## 2024-03-13 DIAGNOSIS — K551 Chronic vascular disorders of intestine: Secondary | ICD-10-CM | POA: Diagnosis present

## 2024-03-13 DIAGNOSIS — E785 Hyperlipidemia, unspecified: Secondary | ICD-10-CM | POA: Diagnosis present

## 2024-03-13 DIAGNOSIS — R001 Bradycardia, unspecified: Secondary | ICD-10-CM | POA: Diagnosis not present

## 2024-03-13 LAB — GLUCOSE, CAPILLARY
Glucose-Capillary: 140 mg/dL — ABNORMAL HIGH (ref 70–99)
Glucose-Capillary: 82 mg/dL (ref 70–99)
Glucose-Capillary: 155 mg/dL — ABNORMAL HIGH (ref 70–99)
Glucose-Capillary: 132 mg/dL — ABNORMAL HIGH (ref 70–99)

## 2024-03-13 MED ORDER — HYDROCODONE-ACETAMINOPHEN 5-325 MG PO TABS
1.0000 | ORAL_TABLET | Freq: Four times a day (QID) | ORAL | Status: DC | PRN
  Administered 2024-03-13: 1 via ORAL
  Filled 2024-03-13: qty 1

## 2024-03-13 MED ORDER — ISOSORBIDE MONONITRATE ER 60 MG PO TB24
60.0000 mg | ORAL_TABLET | Freq: Every day | ORAL | Status: DC
  Administered 2024-03-13 – 2024-03-14 (×2): 60 mg via ORAL
  Filled 2024-03-13: qty 1

## 2024-03-13 NOTE — Progress Notes (Signed)
 Mobility Specialist Progress Note;   03/13/24 0910  Mobility  Activity Ambulated with assistance to bathroom  Level of Assistance Contact guard assist, steadying assist  Assistive Device Other (Comment) (HHA)  Distance Ambulated (ft) 7 ft  Activity Response Tolerated well  Mobility Referral Yes  Mobility visit 1 Mobility  Mobility Specialist Start Time (ACUTE ONLY) 0850  Mobility Specialist Stop Time (ACUTE ONLY) 0901  Mobility Specialist Time Calculation (min) (ACUTE ONLY) 11 min   Pt agreeable to mobility. Required MinG assistance during ambulation for safety. C/o feeling tired today. Requested assistance to BR to have a BM. Told to pull call bell once finished for help.   Caesar Bookman Mobility Specialist Please contact via SecureChat or [employer] [phone]

## 2024-03-13 NOTE — Progress Notes (Signed)
 PROGRESS NOTE    Patient: [name]                            PCP: Irena Reichmann, DO                    DOB: 1933-07-27            DOA: 03/10/2024 WUJ:[id number]             DOS: 03/13/2024, 1:01 PM   LOS: 0 days   Date of Service: The patient was seen and examined on 03/13/2024  Subjective:   The patient was seen and examined this morning, still complaining of some mild lower chest pain, abdominal pain, back pain--- improved tolerable  Blood pressure still labile. Denies any having any shortness of breath  Brief Narrative:    [name] is a 89 y.o. female with hx of CAD, CABG, PAD, HFpEF, hypertension, hyperlipidemia, diabetes, peptic ulcer disease, RA, who presented due to chest pain.    Reports that recently she has been bothered by dizziness and nausea which is being evaluated by primary care.  She was hypotensive in clinic and her hydralazine was discontinued.  Recently also seen cardiology and noted to be hypotensive and her isosorbide mononitrate was reduced from 60 to 30 mg daily.   Yesterday in the morning she noted that she had severe range hypertension with systolic up to the 240s.  Although her hydralazine had recently been discontinued she took a dose to help reduce her severe range hypertension.  She had noted chest pressure throughout the day which was relatively constant.  Around 7 PM she developed chest pain in the lower central chest which radiated towards her back.  Due to the onset of worsening chest pain she took a tablet of nitroglycerin and repeated this twice.    Family called EMS.  Around this time she had what sounds like a syncopal episode and was briefly unresponsive.  Per report had systolic BP in the 70s prior to EMS arrival.  At time of interview her chest pain has resolved.  She is able to do light housework and denies any exertional symptoms with this amount of activity.  No dyspnea on exertion.  Is having lightheadedness and dizziness intermittently as  well as nausea.  No vomiting or diarrhea.  No other changes in medications other than mentioned above.    Assessment & Plan:   Principal Problem:   Chest pain Active Problems:   CAD (coronary artery disease)   Diabetes mellitus (HCC)   Non-insulin dependent type 2 diabetes mellitus (HCC)   Near syncope   Hx of CABG   Chronic diastolic CHF (congestive heart failure) (HCC)   Polyneuropathy due to type 2 diabetes mellitus (HCC)   Peptic ulcer disease   Type 2 diabetes mellitus with other specified complication (HCC)   Labile hypertension   Assessment/Plan:   Chest pain-abdominal pain -back pain -Some improvement reported by patient -May be contributed from-mesenteric ischemia -S/p CT angiogram completed 03/11/2024 -no aortic dissection was identified, but 60-70% stenosis of celiac axis, 50-75% stenosis of origin of IMA was noted -Case was discussed overnight with vascular surgery Dr. Hetty Blend -who recommended aggressive control of blood pressure,   -Point-of-care troponin negative -Echo reviewed, no changes on EKG -As needed nitroglycerin, analgesics  Celiac dissection and chronic mesenteric ischemia -Vascular surgery recommends medical management; seen 3/31 consult note;   H/o  CAD, CABG,  HFpEF  -Stable angina -may be related to episodic accelerated hypertension   -Troponin x 2 negative -Monitoring vitals-labile BP -No significant changes on EKG-T wave inversion,  -Cardiology consulted -following titrating medications   Labile hypertension/ Hypertensive urgency / Recent hypotension -Pressure improved, still labile today   -POA: Presyncope/syncope at home 1 episode-likely due to drop to BP -Systolic BP as high as 234, as low as 70s -Etiology still not clear  Per cardiology:  Coreg at lowest dose 3.125 mg p.o. twice daily,  hydralazine increased from 10 to 25 mg 3 times daily today 4/1  Imdur increased from 30 to 60 mg p.o. daily today 4/1   Syncope versus  presyncope episode at home -Likely related to vasovagal, labile blood pressure-no dysrhythmias appreciated -2D echo reviewed, - CT angiogram all reviewed-aside from recent tract stenosis, no significant aortic dissection or significant findings contributing to syncope     -May benefit from ambulatory blood pressure monitoring   Chronic medical problems:  CAD, CABG/PAD/HLD: Continue home Plavix, rosuvastatin. HFpEF: Without acute exacerbation, not on diuretics at home. Diabetes type 2: Hold home glimepiride. SSI for very sensitive while inpatient. Peptic ulcer disease: See above starting on pantoprazole for empiric treatment of gastritis. Rheumatoid arthritis: On methotrexate weekly on Tuesdays.  Continue home prednisone and 4 mg daily.  Continue home folate   Body mass index is 26.07 kg/m.     ----------------------------------------------------------------------------------------------------------------------------------------------- Nutritional status:  The patient's BMI is: Body mass index is 25.74 kg/m. I agree with the assessment and plan as outlined -----------------------------------------------------------------------------------------------------------------------------------------  DVT prophylaxis:  SCDs Start: 03/11/24 0048   Code Status:   Code Status: Full Code  Family Communication: No family member present at bedside- -Advance care planning has been discussed.   Admission status:   Status is: Observation The patient remains OBS appropriate and will d/c before 2 midnights.   Disposition: From  - home             Planning for discharge in 1 days: to Home   Procedures:   No admission procedures for hospital encounter.   Antimicrobials:  Anti-infectives (From admission, onward)    None        Medication:   alum & mag hydroxide-simeth  30 mL Oral Once   carvedilol  3.125 mg Oral BID WC   clopidogrel  75 mg Oral QHS   [START ON 03/14/2024]  diphenhydrAMINE  50 mg Oral Once   Or   [START ON 03/14/2024] diphenhydrAMINE  50 mg Intravenous Once   ezetimibe  10 mg Oral QHS   folic acid  1 mg Oral Daily   gabapentin  400 mg Oral BID   hydrALAZINE  5 mg Intravenous Once   hydrALAZINE  25 mg Oral Q8H   insulin aspart  0-9 Units Subcutaneous Q4H   isosorbide mononitrate  60 mg Oral Daily   LORazepam  0.5 mg Intravenous Once   multivitamin with minerals  1 tablet Oral Daily   pantoprazole  40 mg Oral Daily   rosuvastatin  20 mg Oral Daily   sodium chloride flush  3 mL Intravenous Q12H    acetaminophen, hydrALAZINE, LORazepam, melatonin, ondansetron (ZOFRAN) IV, polyethylene glycol   Objective:   Vitals:   03/13/24 0305 03/13/24 0622 03/13/24 0753 03/13/24 1224  BP: 138/62 (!) 180/91 (!) 153/76 116/79  Pulse: (!) 57 (!) 51 (!) 58 64  Resp: 17  14 18   Temp: 97.8 F (36.6 C)  97.6 F (36.4 C) (!) 96.8 F (36 C)  TempSrc: Oral  Oral Axillary  SpO2: 96% 97%    Weight:      Height:        Intake/Output Summary (Last 24 hours) at 03/13/2024 1301 Last data filed at 03/13/2024 1228 Gross per 24 hour  Intake 480 ml  Output --  Net 480 ml   Filed Weights   03/11/24 0255 03/11/24 0345 03/12/24 0400  Weight: 65.6 kg 65.6 kg 65.9 kg     Physical examination:    General:  AAO x 3,  cooperative, no distress;   HEENT:  Normocephalic, PERRL, otherwise with in Normal limits   Neuro:  CNII-XII intact. , normal motor and sensation, reflexes intact   Lungs:   Clear to auscultation BL, Respirations unlabored,  No wheezes / crackles  Cardio:    S1/S2, RRR, No murmure, No Rubs or Gallops   Abdomen:  Soft, non-tender, bowel sounds active all four quadrants, no guarding or peritoneal signs.  Muscular  skeletal:  Limited exam -global generalized weaknesses - in bed, able to move all 4 extremities,   2+ pulses,  symmetric, No pitting edema  Skin:  Dry, warm to touch, negative for any Rashes,  Wounds: Please see nursing  documentation   ----------------------------------------------------------------------------------------------------------------------------------------    LABs:     Latest Ref Rng & Units 03/11/2024    8:42 AM 03/10/2024    9:00 PM 03/02/2024    4:04 PM  CBC  WBC 4.0 - 10.5 K/uL 4.6  7.1  7.5   Hemoglobin 12.0 - 15.0 g/dL 16.1  09.6  04.5   Hematocrit 36.0 - 46.0 % 34.7  41.0  41.2   Platelets 150 - 400 K/uL 205  228  249       Latest Ref Rng & Units 03/11/2024    8:42 AM 03/10/2024    9:00 PM 03/02/2024    4:04 PM  CMP  Glucose 70 - 99 mg/dL 409  811  914   BUN 8 - 23 mg/dL 19  16  18    Creatinine 0.44 - 1.00 mg/dL 7.82  9.56  2.13   Sodium 135 - 145 mmol/L 136  135  137   Potassium 3.5 - 5.1 mmol/L 4.0  4.6  4.4   Chloride 98 - 111 mmol/L 102  100  97   CO2 22 - 32 mmol/L 26  26  24    Calcium 8.9 - 10.3 mg/dL 9.1  9.2  9.5        Micro Results Recent Results (from the past 240 hours)  MRSA Next Gen by PCR, Nasal     Status: None   Collection Time: 03/10/24  9:16 PM   Specimen: Nasal Mucosa; Nasal Swab  Result Value Ref Range Status   MRSA by PCR Next Gen NOT DETECTED NOT DETECTED Final    Comment: (NOTE) The GeneXpert MRSA Assay (FDA approved for NASAL specimens only), is one component of a comprehensive MRSA colonization surveillance program. It is not intended to diagnose MRSA infection nor to guide or monitor treatment for MRSA infections. Test performance is not FDA approved in patients less than 66 years old. Performed at [facility], [address], [place], [place] [address]     Radiology Reports No results found.   SIGNED: Kendell Bane, MD, FHM. FAAFP. [facility] - [facility] Time spent - 55 min.  In seeing, evaluating and examining the patient. Reviewing medical records, labs, drawn plan of care. [facility],  Pager (please use [email or web address] to  page/ text) Please use Epic Secure Chat for non-urgent communication (7AM-7PM)  If  7PM-7AM, please contact night-coverage [email or web address], 03/13/2024, 1:01 PM

## 2024-03-13 NOTE — Progress Notes (Addendum)
 Progress Note  Patient Name: [name] Date of Encounter: 03/13/2024 Primary Cardiologist: Bryan Lemma, MD   Subjective   On average blood pressure has improved, it is still labile. Patient notes no chest pain. Still has back pain: no abdominal pain.  Back pain does not change with diet  Vital Signs    Vitals:   03/12/24 2254 03/13/24 0305 03/13/24 0622 03/13/24 0753  BP: 138/78 138/62 (!) 180/91 (!) 153/76  Pulse: 71 (!) 57 (!) 51 (!) 58  Resp: 20 17  14   Temp: 98.1 F (36.7 C) 97.8 F (36.6 C)  97.6 F (36.4 C)  TempSrc: Oral Oral  Oral  SpO2: 94% 96% 97%   Weight:      Height:        Intake/Output Summary (Last 24 hours) at 03/13/2024 0807 Last data filed at 03/13/2024 0626 Gross per 24 hour  Intake 240 ml  Output 300 ml  Net -60 ml   Filed Weights   03/11/24 0255 03/11/24 0345 03/12/24 0400  Weight: 65.6 kg 65.6 kg 65.9 kg    Physical Exam   GEN: No acute distress.   Neck: Carotid Bruit  Cardiac: RRR, systolic murmur (this may be radiation of the bruit) no rubs, or gallops.  Respiratory: Clear to auscultation bilaterally. GI: Soft, nontender, non-distended  MS: No edema  Labs   Telemetry: SR    Chemistry Recent Labs  Lab 03/10/24 2100 03/11/24 0842  NA 135 136  K 4.6 4.0  CL 100 102  CO2 26 26  GLUCOSE 262* 218*  BUN 16 19  CREATININE 1.19* 1.16*  CALCIUM 9.2 9.1  GFRNONAA 44* 45*  ANIONGAP 9 8     Hematology Recent Labs  Lab 03/10/24 2100 03/11/24 0842  WBC 7.1 4.6  RBC 4.58 3.90  HGB 12.3 10.7*  HCT 41.0 34.7*  MCV 89.5 89.0  MCH 26.9 27.4  MCHC 30.0 30.8  RDW 13.9 14.1  PLT 228 205     Cardiac Studies   Cardiac Studies & Procedures   ______________________________________________________________________________________________ CARDIAC CATHETERIZATION  CARDIAC CATHETERIZATION 11/01/2016  Narrative Images from the original result were not included.   LV end diastolic pressure is mildly elevated. In the setting  of systemic hypertension  SVG-diagonal is moderate in size. Origin lesion, 55 %stenosed.  LM lesion, 55 %stenosed.  Ost LAD lesion, 100 %stenosed.  Prox LAD to Mid LAD lesion, 100 %stenosed. After D1.  Very small caliber Ost Ramus lesion, 70 %stenosed.  Ost Cx to Prox Cx lesion, 80 %stenosed. Prox Cx lesion, 70 %stenosed.  SVG-OM is large and anatomically normal. The flow in the graft is reversed. The bifurcating Cx OM is relatively normal.  Very tortuous L Subclavian artery - unable to perform L Radial Cath.   Patient has relatively stable coronary disease with maybe mild progression of the vein graft to the diagonal branch ostial disease that was maybe 40% previously. This does not appear to be enough flow limiting to cause resting symptoms, however most likely etiology is accelerated hypertension, accelerated LVEDP and microvascular disease with a small ramus branch and other branches from the native system.  PLAN:  Admit to step down for ongoing blood pressure control. Will use IV nitroglycerin overnight. I will increase carvedilol to 6.25 twice a day and add amlodipine.  Continue aspirin, Plavix and statin  Can stop IV heparin   Continue medical management otherwise. Will restart Imdur once her nitroglycerin is weaned off.    Bryan Lemma, M.D., M.S.  Interventional Cardiologist  Pager # [phone] Phone # [phone] [address] [place], [place] [address]  Findings Coronary Findings Diagnostic  Dominance: Right  Left Main The lesion is located at the major branch and tubular.  Left Anterior Descending The lesion is chronically occluded. Just after a proximal septal perforator The lesion is chronically occluded.  First Septal Branch Vessel is small in size.  Second Diagonal Branch Vessel is small in size.  Second Septal Branch Vessel is small in size.  Third Diagonal Branch Vessel is small in size.  Third Septal Branch Vessel is  small in size.  Ramus Intermedius Vessel is small. The lesion is smooth. Very small caliber ramus  Left Circumflex There is mild  diffuse disease throughout the vessel. The lesion is irregular. The lesion is calcified. Chronic The lesion is irregular. The lesion is calcified. Chronic  Second Obtuse Marginal Branch Vessel is small in size.  Right Coronary Artery Vessel is large. Vessel is angiographically normal.  Acute Marginal Branch Vessel is small in size.  Right Posterior Descending Artery Vessel is moderate in size.  Inferior Septal Vessel is small in size.  Right Posterior Atrioventricular Artery Vessel is moderate in size. Vessel is angiographically normal.  First Right Posterolateral Branch Vessel is small in size. Vessel is angiographically normal.  Second Right Posterolateral Branch Vessel is moderate in size. Vessel is angiographically normal.  LIMA LIMA Graft To Dist LAD LIMA and is normal in caliber and anatomically normal.  Saphenous Graft To 1st Diag SVG and is moderate in size. The lesion is tubular and smooth.  Saphenous Graft To Lat 1st Mrg SVG and is large and anatomically normal. The flow in the graft is reversed.  Intervention  No interventions have been documented.   STRESS TESTS  MYOCARDIAL PERFUSION IMAGING 02/08/2022  Narrative   Normal blood pressure and normal heart rate response noted during stress. Heart rate recovery was normal.   No ST deviation was noted. There were no arrhythmias during recovery.   LV perfusion is normal. There is no evidence of ischemia. There is no evidence of infarction.   Left ventricular function is abnormal. Nuclear stress EF: 61 %. The left ventricular ejection fraction is normal (55-65%). End diastolic cavity size is normal. End systolic cavity size is normal. Evidence of transient ischemic dilation (TID) noted. TID was appreciated quantitatively but not visually.   The study is normal. Findings are  consistent with no prior ischemia. The study is low risk.   ECHOCARDIOGRAM  ECHOCARDIOGRAM COMPLETE 03/11/2024  Narrative ECHOCARDIOGRAM REPORT    Patient Name:   [name] Date of Exam: 03/11/2024 Medical Rec #:  [id number]    Height:       63.0 in Accession #:    [id number]   Weight:       144.6 lb Date of Birth:  12/31/1932   BSA:          1.685 m Patient Age:    89 years     BP:           133/81 mmHg Patient Gender: F            HR:           60 bpm. Exam Location:  Inpatient  Procedure: 2D Echo, Cardiac Doppler and Color Doppler (Both Spectral and Color Flow Doppler were utilized during procedure).  Indications:    Chest Pain R07.9  History:        Patient has prior history of  Echocardiogram examinations, most recent 04/14/2023. CHF, Previous Myocardial Infarction, Prior CABG; Risk Factors:Diabetes.  Sonographer:    Darlys Gales Referring Phys: [id number] CALLIE E GOODRICH  IMPRESSIONS   1. Left ventricular ejection fraction, by estimation, is 65 to 70%. The left ventricle has normal function. The left ventricle has no regional wall motion abnormalities. Left ventricular diastolic parameters are consistent with Grade I diastolic dysfunction (impaired relaxation). 2. Right ventricular systolic function is normal. The right ventricular size is normal. 3. Left atrial size was moderately dilated. 4. The mitral valve is normal in structure. No evidence of mitral valve regurgitation. No evidence of mitral stenosis. 5. The aortic valve is tricuspid. Aortic valve regurgitation is not visualized. No aortic stenosis is present. 6. The inferior vena cava is normal in size with greater than 50% respiratory variability, suggesting right atrial pressure of 3 mmHg.  Comparison(s): No significant change from prior study. Prior images reviewed side by side.  FINDINGS Left Ventricle: Left ventricular ejection fraction, by estimation, is 65 to 70%. The left ventricle has normal function.  The left ventricle has no regional wall motion abnormalities. The left ventricular internal cavity size was normal in size. There is no left ventricular hypertrophy. Abnormal (paradoxical) septal motion consistent with post-operative status. Left ventricular diastolic parameters are consistent with Grade I diastolic dysfunction (impaired relaxation). Normal left ventricular filling pressure.  Right Ventricle: The right ventricular size is normal. No increase in right ventricular wall thickness. Right ventricular systolic function is normal.  Left Atrium: Left atrial size was moderately dilated.  Right Atrium: Right atrial size was normal in size.  Pericardium: There is no evidence of pericardial effusion.  Mitral Valve: The mitral valve is normal in structure. Mild to moderate mitral annular calcification. No evidence of mitral valve regurgitation. No evidence of mitral valve stenosis.  Tricuspid Valve: The tricuspid valve is normal in structure. Tricuspid valve regurgitation is mild.  Aortic Valve: The aortic valve is tricuspid. Aortic valve regurgitation is not visualized. No aortic stenosis is present. Aortic valve mean gradient measures 5.0 mmHg. Aortic valve peak gradient measures 6.7 mmHg. Aortic valve area, by VTI measures 2.12 cm.  Pulmonic Valve: The pulmonic valve was normal in structure. Pulmonic valve regurgitation is not visualized. No evidence of pulmonic stenosis.  Aorta: The aortic root is normal in size and structure.  Venous: The inferior vena cava is normal in size with greater than 50% respiratory variability, suggesting right atrial pressure of 3 mmHg.  IAS/Shunts: No atrial level shunt detected by color flow Doppler.   LEFT VENTRICLE PLAX 2D LVIDd:         4.20 cm   Diastology LVIDs:         3.00 cm   LV e' medial:    6.20 cm/s LV PW:         0.90 cm   LV E/e' medial:  10.0 LV IVS:        1.10 cm   LV e' lateral:   6.96 cm/s LVOT diam:     1.95 cm   LV E/e'  lateral: 8.9 LV SV:         59 LV SV Index:   35 LVOT Area:     2.99 cm   LEFT ATRIUM             Index        RIGHT ATRIUM          Index LA Vol (A2C):   32.8 ml 19.47 ml/m  RA  Area:     9.74 cm LA Vol (A4C):   45.6 ml 27.07 ml/m  RA Volume:   18.30 ml 10.86 ml/m LA Biplane Vol: 40.3 ml 23.92 ml/m AORTIC VALVE AV Area (Vmax):    2.55 cm AV Area (Vmean):   2.12 cm AV Area (VTI):     2.12 cm AV Vmax:           129.00 cm/s AV Vmean:          102.000 cm/s AV VTI:            0.279 m AV Peak Grad:      6.7 mmHg AV Mean Grad:      5.0 mmHg LVOT Vmax:         110.00 cm/s LVOT Vmean:        72.300 cm/s LVOT VTI:          0.198 m LVOT/AV VTI ratio: 0.71  AORTA Ao Root diam: 3.10 cm  MITRAL VALVE                TRICUSPID VALVE MV Area (PHT): 1.86 cm     TR Peak grad:   20.6 mmHg MV Decel Time: 408 msec     TR Vmax:        227.00 cm/s MV E velocity: 61.80 cm/s MV A velocity: 100.00 cm/s  SHUNTS MV E/A ratio:  0.62         Systemic VTI:  0.20 m Systemic Diam: 1.95 cm  Mihai Croitoru MD Electronically signed by Thurmon Fair MD Signature Date/Time: 03/11/2024/1:56:27 PM    Final          ______________________________________________________________________________________________       Assessment & Plan   Atypical Chest pain with celiac dissection and chronic mesenteric ischemia - Vascular surgery recommends medical management; seen 3/31 consult note; she has asked question about why she is not getting intervention on this, reviewed in brief will defer further discussion as per her vascular surgeon  Labile hypertension - BP is still labile, but greatly improved - hydralazine 25 mg; will increase imdur to 60 mg - no evidence of renal artery stenosis - cortisol WNL   Memory issues - Resolved  CAD s/p CABG - negative troponin this admission, presently without CP, has known  - PET MPI at outpatient is recommended - continue low dose coreg (1/2 dose  outpatient, this is planned at DC) - Echo unchanged  PAD and CAS - as per vascular surgery (see 3/31 consult note)  Bradycardia - coreg dose lowered 3/30; bradycardia resolved  HLD - on zetia and statin  Anemia - new from 3/30; as per TR[facility] - unless worsening will continue anti-platelet therapy  CKD IIIa with T2 DM - stable  Will plan for outpatient follow up No labs ordered 3/31 or 4/1 (will defer additional testing for anemia as per primary, if indicated)  Will arrange f/u with Dr. Hazle Coca team; she notes she hasn't seen Dr. Herbie Baltimore in over 2 years ADDENDUM: f/u with Dr. Hazle Coca Team Scheduled for May 2025 - outpatient PET MPI consented and ordered.   For questions or updates, please contact [facility] Please consult [email or web address] for contact info under Cardiology/STEMI.      Riley Lam, MD FASE [facility] Cardiologist [facility]  [address] [place], [address] [place], [place] [address] [phone]  8:07 AM

## 2024-03-13 NOTE — Evaluation (Signed)
 Physical Therapy Evaluation and Discharge Patient Details Name: [name] MRN: [id number] DOB: May 27, 1933 Today's Date: 03/13/2024  History of Present Illness  Pt is an 89 y/o F presenting to ED on 3/29 with chest pain and hypertension. Brief episode of syncope/unresponsiveness with SBP in the 70s upon EMS arrival. CTA chest negative for aortic dissection, but 80% celiac artery stenosis incidental finding. Chronic dissection of L celiac artery. PMH includes CAD, CABG, PAD, HFpEF, HTN, HLD, diabetes, peptic ulcer disease, RA  Clinical Impression   Patient evaluated by Physical Therapy with no further acute PT needs identified. All education has been completed and the patient has no further questions. Patient generally not feeling well (reports constipation). BP and HR stable throughout session (seated 128/54 79, after walking 130/66 80). No imbalance throughout session, including up/down from standard toilet. Patient inquiring re: something to make her toilet taller at home. Informed insurance may not cover a 3n1 and approximate cost. Pt did not feel she could afford this. Educated on toilet riser with/without handles (showed pictures) and approximate cost. Patient may pursue this on her own. PT is signing off. Thank you for this referral.         If plan is discharge home, recommend the following:     Can travel by private vehicle        Equipment Recommendations None recommended by PT  Recommendations for Other Services       Functional Status Assessment Patient has not had a recent decline in their functional status     Precautions / Restrictions Precautions Precautions: Fall Precaution/Restrictions Comments: watch BP Restrictions Weight Bearing Restrictions Per Provider Order: No      Mobility  Bed Mobility               General bed mobility comments: in recliner upon arrival and departure    Transfers Overall transfer level: Needs assistance Equipment used:  None Transfers: Sit to/from Stand Sit to Stand: Contact guard assist           General transfer comment: no instability; guarding for safety with no assist needed    Ambulation/Gait Ambulation/Gait assistance: Contact guard assist Gait Distance (Feet): 200 Feet Assistive device: None Gait Pattern/deviations: Step-through pattern, Decreased stride length   Gait velocity interpretation: 1.31 - 2.62 ft/sec, indicative of limited community ambulator   General Gait Details: occasional rest breaks and holding rail due to "not feeling well" BP and HR stable  Stairs            Wheelchair Mobility     Tilt Bed    Modified Rankin (Stroke Patients Only)       Balance Overall balance assessment: Needs assistance Sitting-balance support: Feet supported Sitting balance-Leahy Scale: Good     Standing balance support: During functional activity, No upper extremity supported Standing balance-Leahy Scale: Fair                               Pertinent Vitals/Pain Pain Assessment Pain Assessment: Faces Faces Pain Scale: Hurts little more Pain Location: back Pain Descriptors / Indicators: Discomfort Pain Intervention(s): Limited activity within patient's tolerance, Monitored during session    Home Living Family/patient expects to be discharged to:: Private residence Living Arrangements: Alone Available Help at Discharge: Family;Available PRN/intermittently (daughter) Type of Home: House Home Access: Ramped entrance       Home Layout: One level Home Equipment: Shower seat Additional Comments: daughter comes often since January  Prior Function Prior Level of Function : Independent/Modified Independent;Driving             Mobility Comments: no AD use ADLs Comments: difficulty with stepping into/out of the shower, ind with med mgmt     Extremity/Trunk Assessment   Upper Extremity Assessment Upper Extremity Assessment: Defer to OT evaluation     Lower Extremity Assessment Lower Extremity Assessment: Overall WFL for tasks assessed    Cervical / Trunk Assessment Cervical / Trunk Assessment: Kyphotic  Communication   Communication Communication: Impaired Factors Affecting Communication: Hearing impaired    Cognition Arousal: Alert Behavior During Therapy: WFL for tasks assessed/performed                             Following commands: Intact       Cueing Cueing Techniques: Verbal cues     General Comments      Exercises     Assessment/Plan    PT Assessment Patient does not need any further PT services  PT Problem List         PT Treatment Interventions      PT Goals (Current goals can be found in the Care Plan section)  Acute Rehab PT Goals Patient Stated Goal: feel better PT Goal Formulation: All assessment and education complete, DC therapy    Frequency       Co-evaluation               AM-PAC PT "6 Clicks" Mobility  Outcome Measure Help needed turning from your back to your side while in a flat bed without using bedrails?: None Help needed moving from lying on your back to sitting on the side of a flat bed without using bedrails?: None Help needed moving to and from a bed to a chair (including a wheelchair)?: None Help needed standing up from a chair using your arms (e.g., wheelchair or bedside chair)?: None Help needed to walk in hospital room?: A Little Help needed climbing 3-5 steps with a railing? : A Little 6 Click Score: 22    End of Session Equipment Utilized During Treatment: Gait belt Activity Tolerance: Patient limited by fatigue Patient left: in chair;with call bell/phone within reach Nurse Communication: Mobility status PT Visit Diagnosis: Difficulty in walking, not elsewhere classified (R26.2)    Time: 1191-4782 PT Time Calculation (min) (ACUTE ONLY): 39 min   Charges:   PT Evaluation $PT Eval Low Complexity: 1 Low PT Treatments $Gait Training: 8-22  mins PT General Charges $$ ACUTE PT VISIT: 1 Visit          Jerolyn Center, PT Acute Rehabilitation Services  Office [phone]   Zena Amos 03/13/2024, 9:59 AM

## 2024-03-13 NOTE — Progress Notes (Signed)
 Mobility Specialist Progress Note;   03/13/24 0920  Mobility  Activity Ambulated with assistance in room;Transferred from bed to chair  Level of Assistance Contact guard assist, steadying assist  Assistive Device Other (Comment) (HHA)  Distance Ambulated (ft) 7 ft  Activity Response Tolerated well  Mobility Referral Yes  Mobility visit 1 Mobility  Mobility Specialist Start Time (ACUTE ONLY) 0910  Mobility Specialist Stop Time (ACUTE ONLY) 0915  Mobility Specialist Time Calculation (min) (ACUTE ONLY) 5 min   Answered pts call bell requesting assistance from BR, no BM. Peri care performed by pt. Agreeable to sit in up in chair. Required MinG assistance for safety. Pt left in chair with all needs met.   Caesar Bookman Mobility Specialist Please contact via SecureChat or [employer] [phone]

## 2024-03-14 ENCOUNTER — Other Ambulatory Visit (HOSPITAL_COMMUNITY): Payer: Self-pay

## 2024-03-14 DIAGNOSIS — R079 Chest pain, unspecified: Secondary | ICD-10-CM | POA: Diagnosis not present

## 2024-03-14 LAB — GLUCOSE, CAPILLARY
Glucose-Capillary: 123 mg/dL — ABNORMAL HIGH (ref 70–99)
Glucose-Capillary: 131 mg/dL — ABNORMAL HIGH (ref 70–99)
Glucose-Capillary: 162 mg/dL — ABNORMAL HIGH (ref 70–99)
Glucose-Capillary: 173 mg/dL — ABNORMAL HIGH (ref 70–99)

## 2024-03-14 MED ORDER — LIDOCAINE 5 % EX PTCH: 1.0000 | MEDICATED_PATCH | CUTANEOUS | Status: DC

## 2024-03-14 MED ORDER — HYDRALAZINE HCL 25 MG PO TABS
25.0000 mg | ORAL_TABLET | Freq: Three times a day (TID) | ORAL | 0 refills | Status: DC
  Filled 2024-03-14: qty 270, 90d supply, fill #0

## 2024-03-14 MED ORDER — CARVEDILOL 3.125 MG PO TABS
3.1250 mg | ORAL_TABLET | Freq: Two times a day (BID) | ORAL | 0 refills | Status: DC
  Filled 2024-03-14: qty 180, 90d supply, fill #0

## 2024-03-14 MED ORDER — ISOSORBIDE MONONITRATE ER 60 MG PO TB24
60.0000 mg | ORAL_TABLET | Freq: Every day | ORAL | 0 refills | Status: DC
  Filled 2024-03-14: qty 90, 90d supply, fill #0

## 2024-03-14 MED ORDER — LIDOCAINE 5 % EX PTCH
1.0000 | MEDICATED_PATCH | Freq: Once | CUTANEOUS | Status: AC
  Administered 2024-03-14: 1 via TRANSDERMAL
  Filled 2024-03-14: qty 1

## 2024-03-14 NOTE — TOC Initial Note (Signed)
 Transition of Care [facility]) - Initial/Assessment Note    Patient Details  Name: [name] MRN: [id number] Date of Birth: [birth year]  [facility] CM/SW Contact:    Lawerance Sabal, RN Phone Number: 03/14/2024, 9:32 AM  Clinical Narrative:                  Spoke w patient at bedside. She states that she does not want any HH services. She states that shehas been told she is doing fine and describes how well she did this morning with ADLs.  She lives at home alone, states she drives, denies barriers to access to medical care.   Expected Discharge Plan: Home/Self Care Barriers to Discharge: Continued Medical Work up   Patient Goals and CMS Choice Patient states their goals for this hospitalization and ongoing recovery are:: to go home          Expected Discharge Plan and Services                                              Prior Living Arrangements/Services                       Activities of Daily Living   ADL Screening (condition at time of admission) Independently performs ADLs?: Yes (appropriate for developmental age) Is the patient deaf or have difficulty hearing?: No Does the patient have difficulty seeing, even when wearing glasses/contacts?: No Does the patient have difficulty concentrating, remembering, or making decisions?: No  Permission Sought/Granted                  Emotional Assessment              Admission diagnosis:  Chest pain [R07.9] Syncope, unspecified syncope type [R55] Chest pain, unspecified type [R07.9] Patient Active Problem List   Diagnosis Date Noted   Labile hypertension 03/11/2024   Pincer nail deformity 06/13/2023   Orthostatic hypotension 05/05/2023   Leg wound, right 05/04/2023   Pain in right hip 11/29/2022   Right arm pain 11/29/2022   Hypertensive urgency 10/20/2022   Nausea and vomiting 10/20/2022   Anxiety 11/04/2021   Avascular necrosis (HCC) 11/04/2021   Chronic interstitial  cystitis 11/04/2021   Diabetic peripheral neuropathy associated with type 2 diabetes mellitus (HCC) 11/04/2021   Polyneuropathy due to type 2 diabetes mellitus (HCC) 11/04/2021   Eczema 11/04/2021   History of urinary tract infection 11/04/2021   Neuropathy 11/04/2021   Other long term (current) drug therapy 11/04/2021   Peptic ulcer disease 11/04/2021   Presence of aortocoronary bypass graft 11/04/2021   Skin sensation disturbance 11/04/2021   Vitamin D deficiency 11/04/2021   Gastroesophageal reflux disease 11/04/2021   Rheumatoid arthritis (HCC) 11/04/2021   Peripheral vascular disease (HCC) 11/04/2021   Type 2 diabetes mellitus with other specified complication (HCC) 11/04/2021   Chronic diastolic CHF (congestive heart failure) (HCC) 01/07/2021   Status post total hip replacement, right 12/04/2020   Status post total replacement of left hip 11/21/2020   Near syncope 07/10/2020   Hx of CABG 07/10/2020   Preoperative cardiovascular examination 04/15/2020   Chest pain 03/18/2020   Unilateral primary osteoarthritis, left hip 04/03/2019   History of right hip replacement 04/03/2019   Status post total replacement of right hip 10/12/2018   Unilateral primary osteoarthritis, right hip 09/12/2018   Degenerative  joint disease 05/30/2018   Anal fissure 11/10/2017   Rectal bleeding 11/10/2017   Rectal pain 11/10/2017   Inflammatory arthritis 10/13/2017   Latent tuberculosis by blood test 10/13/2017   Chronic diastolic CHF (congestive heart failure), NYHA class 2 (HCC) 08/11/2017   Fatigue due to treatment 01/09/2017   NSTEMI (non-ST elevated myocardial infarction) (HCC) 11/01/2016   Diabetes mellitus (HCC) 10/24/2015   Non-insulin dependent type 2 diabetes mellitus (HCC) 10/24/2015   Angina, class I (HCC) 04/22/2015   DOE (dyspnea on exertion) 04/22/2015   Bilateral leg edema 04/22/2015   Accumulation of fluid in tissues 03/21/2014   Vertigo, central 01/02/2014   Vertigo 01/02/2014    Limb pain- echymosis, pain Lt radial artery after cath 12/11/2013   Dyslipidemia, goal LDL below 70 05/17/2013   Essential hypertension 05/17/2013   Claudication (HCC) 05/16/2013   PTA/ Stent Rt popliteal and Rt peroneal artery 05/16/13 05/16/2013   PAD,severe calcified pop and tibial peroneal trunk disease bilateraly    ABDOMINAL PAIN RIGHT LOWER QUADRANT 10/07/2010   CVA-STROKE 03/04/2010   BARRETT'S ESOPHAGUS 03/04/2010   FECAL INCONTINENCE 03/04/2010   DM 01/14/2010   Anemia of chronic disease 01/14/2010   CAD (coronary artery disease) 01/14/2010   DIVERTICULOSIS OF COLON 01/14/2010   History of colonic polyps 01/14/2010   SHELLFISH ALLERGY 01/14/2010   PCP:  Irena Reichmann, DO Pharmacy:   [facility] [phone] [place], [place] - [address] [place] [address] Phone: [phone] Fax: [phone]     Social Drivers of Health (SDOH) Social History: SDOH Screenings   Food Insecurity: No Food Insecurity (03/11/2024)  Housing: Low Risk  (03/11/2024)  Transportation Needs: No Transportation Needs (03/11/2024)  Utilities: Not At Risk (03/11/2024)  Social Connections: Socially Integrated (03/11/2024)  Tobacco Use: Low Risk  (03/10/2024)   SDOH Interventions:     Readmission Risk Interventions     No data to display

## 2024-03-14 NOTE — Discharge Summary (Signed)
 Physician Discharge Summary   Patient: [name] MRN: [id number] DOB: [birth year]  Admit date:     03/10/2024  Discharge date: 03/14/24  Discharge Physician: Alberteen Sam   PCP: Irena Reichmann, DO     Recommendations at discharge:  Follow up with PCP Dr. Thomasena Edis in 1 week for chest pain and hypertension Follow up with Cardiology in 4 weeks for chest pain Cardiology intend outpatient MPA Follow up with Vascular Surgery Dr. Hetty Blend in 6 weeks for celiac artery stenosis     Discharge Diagnoses: Principal Problem:   Chest pain likely due to hypertensive urgency Active Problems:   Vasovagal syncope   Labile hypertension   Aortic and celiac atherosclerosis   CAD (coronary artery disease) s/p CABG   Diabetes mellitus (HCC)   Chronic diastolic CHF (congestive heart failure) (HCC)   Polyneuropathy due to type 2 diabetes mellitus (HCC)   Type 2 diabetes mellitus with other specified complication (HCC)   Chronic kidney disease stage IIIb   Anemia, normocytic, likely due to chronic kidney disease   Bradycardia     Hospital Course: 89 y.o. F with MCI, lives alone, RA on methotrexate, HTN, CAD, PVD, DM, dCHF and CKD IIIb who presnted with chest pain and a syncopal episode.  2 months prior to admission, seen in the cardiology office, blood pressure low and so hydralazine stopped.  Seen again in the Cardiology office 2 weeks prior to admission, again hypotensive, but improved with drinking water an deating some crackers.  Imdur decreased.  Finally, the day of admission she woke with chest heaviness, found her blood pressure was 210s over 110s.  Took a dose of her home hydralazine but and did not have improvement, took 3 doses of nitroglycerin, and finally had a syncopal episode and was brought to the ER.  In the ER, troponins negative, CTA chest ruled out PE, pneumonia, mass, aortic dissection.     Chest pain Back pain Labile hypertension Hypertensive urgency Patient  was admitted and evaluated by cardiology.  CT angiogram, echocardiogram unremarkable.  Cardiology recommend outpatient follow-up in 4 weeks, continue medical management with Plavix, Crestor, Zetia, and resumption of hydralazine, lowering dose of carvedilol, and increasing dose of Imdur.  Follow-up with PCP in 1 week    Vasovagal syncope Patient felt woozy then passed out, after taking hydralazine and 3 nitroglycerin.  BP was 70s on arrival of EMS, she returned to baseline spontaneously.    Given above normal work up, diagnosis of vagal episode made by Cardiology, hospitalist team.   Chronic celiac artery stenosis This was an incidental finding on angiogram.  Vascular surgery were consulted, they noted that it was a finding dating back to 2021.  There is low suspicion that this was the cause of her pain, or ongoing nausea.  Medical management recommended with continued Plavix, Zetia, Crestor, blood pressure management.  Coronary artery disease Chronic diastolic congestive heart failure           The [facility] Controlled Substances Registry was reviewed for this patient prior to discharge.  Consultants: Cardiology Vascular surgery  Procedures performed: Echocardiogram CT angiogram of the chest  Disposition: Home Diet recommendation:  Discharge Diet Orders (From admission, onward)     Start     Ordered   03/14/24 0000  Diet - low sodium heart healthy        03/14/24 1040             DISCHARGE MEDICATION: Allergies as of 03/14/2024  Reactions   Fish Allergy Hives, Shortness Of Breath   Iodinated Contrast Media Shortness Of Breath, Anaphylaxis   Other reaction(s): Unknown   Latex Anaphylaxis, Hives, Shortness Of Breath, Itching, Other (See Comments)   REACTION: wheezing Other reaction(s): Unknown   Shellfish Allergy Anaphylaxis, Hives, Shortness Of Breath, Other (See Comments)   All seafood   Amlodipine Swelling   Evolocumab Other (See Comments)    Other reaction(s): latex on syringe Unknown reaction   Other Itching, Other (See Comments)   Patient reports allergy to perfumed detergents   Metformin Nausea And Vomiting        Medication List     TAKE these medications    acetaminophen 650 MG CR tablet Commonly known as: TYLENOL Take 650 mg by mouth daily as needed for pain.   carvedilol 3.125 MG tablet Commonly known as: COREG Take 1 tablet (3.125 mg total) by mouth 2 (two) times daily with a meal. What changed:  medication strength how much to take   clopidogrel 75 MG tablet Commonly known as: PLAVIX Take 75 mg by mouth at bedtime.   denosumab 60 MG/ML Sosy injection Commonly known as: PROLIA Inject 60 mg into the skin every 6 (six) months.   ezetimibe 10 MG tablet Commonly known as: ZETIA Take 10 mg by mouth at bedtime.   folic acid 1 MG tablet Commonly known as: FOLVITE Take 1 mg by mouth daily.   gabapentin 400 MG capsule Commonly known as: NEURONTIN Take 400 mg by mouth 2 (two) times daily.   glimepiride 2 MG tablet Commonly known as: AMARYL Take 2 mg by mouth daily with breakfast.   hydrALAZINE 25 MG tablet Commonly known as: APRESOLINE Take 1 tablet (25 mg total) by mouth every 8 (eight) hours.   isosorbide mononitrate 60 MG 24 hr tablet Commonly known as: IMDUR Take 1 tablet (60 mg total) by mouth daily. Start taking on: March 15, 2024 What changed:  medication strength how much to take   LORazepam 0.5 MG tablet Commonly known as: ATIVAN 1 tablet Orally Once a day as needed for anxiety for 90 days   methotrexate 2.5 MG tablet Commonly known as: RHEUMATREX Take 20 mg by mouth every Tuesday. Take 8 tablets by mouth every Tuesday  Caution:Chemotherapy. Protect from light.   multivitamin capsule Take 1 capsule by mouth daily.   nitroGLYCERIN 0.4 MG SL tablet Commonly known as: NITROSTAT PLACE 1 TABLET UNDER THE TONGUE EVERY 5 MINUTES AS NEEDED FOR CHEST PAIN.   ondansetron 8 MG  disintegrating tablet Commonly known as: ZOFRAN-ODT Take 8 mg by mouth every 8 (eight) hours as needed for nausea.   predniSONE 1 MG tablet Commonly known as: DELTASONE Take 4 mg by mouth daily.   rosuvastatin 20 MG tablet Commonly known as: CRESTOR Take 20 mg by mouth daily.   Vitamin D3 10 MCG (400 UNIT) tablet Take 400 Units by mouth daily.        Follow-up Information     Irena Reichmann, DO. Schedule an appointment as soon as possible for a visit in 1 week(s).   Specialty: Family Medicine Why: April 7,2025 @4  PM Contact information: [address] [place] [address] [phone]         Runell Gess, MD Follow up.   Specialties: Cardiology, Radiology Contact information: [address] [place] [address] [phone]         Daria Pastures, MD Follow up.   Specialty: Vascular Surgery Why: Apr 27, 2024 @ 9AM Contact information: [address] [place] [address] [phone]                 Discharge Instructions     Diet - low sodium heart healthy   Complete by: As directed    Discharge instructions   Complete by: As directed    **IMPORTANT DISCHARGE INSTRUCTIONS**   From Dr. Maryfrances Bunnell: You were evaluated for chest pain.  Here, you had blood work and imaging that were all reassuring that your chest pain was not from an active serious issue.  You had blood testing that ruled out active damage to the heart (in other words, no heart attack).    You had a CT of the chest that ruled out blood clots, pneumonia, broken bones, cancer, or other serious conditions of the heart or lungs  It did show plaque build up in the arteries, but this is not new (we can see evidence of it on scans of yours from years ago), so it is not likely the cause of your pains.  If anything, we suspect that the chest pain MAY have been from high blood pressure.  You were evaluated by the heart specialists, who recommended  the following changes to blood pressure medicines:   INCREASE isosorbide mononitrate/Imdur to 60 mg daily  RESTART hydralazine 25 mg three times daily  REDUCE Carvedilol/Coreg to 3.125 mg twice daily  In addition, CONTINUE your cholesterol medicines ezetimibe/Zetia and rosuvastatin/Crestor as well as your clopidogrel/Plavix  Go see your primary doctor in 1 week and have your blood level and kidney function tested  Keep your appointment with Dr. Hazle Coca office at [facility] on May 9  Go see the Vascular surgery team on May 16 (see below for both of these appointment times in the To Do section)  I have sent prescriptions for the three blood pressure medicines to the pharmacy here. Please ask Dr. Thomasena Edis to refill them.   Increase activity slowly   Complete by: As directed        Discharge Exam: Filed Weights   03/11/24 0255 03/11/24 0345 03/12/24 0400  Weight: 65.6 kg 65.6 kg 65.9 kg  BP 120/62   Pulse (!) 51   Temp 98 F (36.7 C) (Oral)   Resp 18   Ht 5\' 3"  (1.6 m)   Wt 65.9 kg   SpO2 98%   BMI 25.74 kg/m    General: Pt is alert, awake, not in acute distress Cardiovascular: RRR, nl S1-S2, no murmurs appreciated.   No LE edema.   Respiratory: Normal respiratory rate and rhythm.  CTAB without rales or wheezes. Abdominal: Abdomen soft and non-tender.  No distension or HSM.   Neuro/Psych: Strength symmetric in upper and lower extremities.  Judgment and insight appear normal.   Condition at discharge: good  The results of significant diagnostics from this hospitalization (including imaging, microbiology, ancillary and laboratory) are listed below for reference.   Imaging Studies: CT Angio Chest/Abd/Pel for Dissection W and/or W/WO Result Date: 03/12/2024 CLINICAL DATA:  Acute aortic syndrome, hypertension, chest pain radiating to the back EXAM: CT ANGIOGRAPHY CHEST, ABDOMEN AND PELVIS TECHNIQUE: Non-contrast CT of the chest was initially obtained. Multidetector CT imaging  through the chest, abdomen and pelvis was performed using the standard protocol during bolus administration of intravenous contrast. Multiplanar reconstructed images and MIPs were obtained and reviewed to evaluate the vascular anatomy. RADIATION DOSE REDUCTION: This exam was performed according to the departmental dose-optimization program which includes automated exposure control, adjustment  of the mA and/or kV according to patient size and/or use of iterative reconstruction technique. CONTRAST:  80mL OMNIPAQUE IOHEXOL 350 MG/ML SOLN COMPARISON:  CT abdomen pelvis 10/19/2022, CT chest 11/17/2019 FINDINGS: CTA CHEST FINDINGS Cardiovascular: Status post coronary artery bypass grafting. Global cardiac size within normal limits. No pericardial effusion. Central pulmonary arteries are of normal caliber. Moderate atherosclerotic calcification within the thoracic aorta. No aortic aneurysm. Mediastinum/Nodes: No enlarged mediastinal, hilar, or axillary lymph nodes. Thyroid gland, trachea, and esophagus demonstrate no significant findings. Lungs/Pleura: Mild bibasilar atelectasis. Lungs are otherwise clear. No pneumothorax or pleural effusion. Musculoskeletal: No chest wall abnormality. No acute or significant osseous findings. Review of the MIP images confirms the above findings. CTA ABDOMEN AND PELVIS FINDINGS VASCULAR Aorta: Normal caliber aorta without aneurysm, dissection, vasculitis or significant stenosis. Advanced mixed atherosclerotic plaque. 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 Distally widely patent. SMA: Proximally diseased with less than 50% stenosis. Distally widely patent. Renals: Both renal arteries are patent without evidence of aneurysm, dissection, vasculitis, fibromuscular dysplasia or significant stenosis. IMA: 50-75% stenosis at the origin.  Distally widely patent. Inflow: Patent without  evidence of aneurysm, dissection, vasculitis or significant stenosis. Extensive atherosclerotic calcification. Veins: No obvious venous abnormality within the limitations of this arterial phase study. Circumaortic left renal vein. Review of the MIP images confirms the above findings. NON-VASCULAR Hepatobiliary: No focal liver abnormality is seen. No gallstones, gallbladder wall thickening, or biliary dilatation. Pancreas: Unremarkable Spleen: Unremarkable Adrenals/Urinary Tract: Adrenal glands are unremarkable. Kidneys are normal, without renal calculi, focal lesion, or hydronephrosis. Bladder is unremarkable. Stomach/Bowel: Moderate sigmoid diverticulosis. Stomach, small bowel, and large bowel are otherwise unremarkable. Appendix normal. No evidence of obstruction or focal inflammation. No free intraperitoneal gas or fluid. Lymphatic: No pathologic adenopathy. Reproductive: Status post hysterectomy. No adnexal masses. Other: No abdominal wall hernia Musculoskeletal: Bilateral total hip arthroplasty. Grade 1-2 anterolisthesis L4-5 with ankylosis of the facet joints. Osseous structures are otherwise age-appropriate. No acute bone abnormality. No lytic or blastic bone lesion. L4 posterior decompression has been performed. Review of the MIP images confirms the above findings. IMPRESSION: 1. No evidence of aortic aneurysm or dissection. 2. 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 Distally widely patent. 3. 50-75% stenosis at the origin of the IMA. Distally widely patent. Given the multiple mesenteric stenoses, clinical correlation for signs and symptoms of chronic mesenteric ischemia may be helpful for further management. 4. Status post coronary artery bypass grafting. 5. Moderate sigmoid diverticulosis without superimposed acute inflammatory change. Aortic Atherosclerosis (ICD10-I70.0). Electronically Signed   By: Helyn Numbers M.D.   On: 03/12/2024 00:36   ECHOCARDIOGRAM COMPLETE Result Date: 03/11/2024    ECHOCARDIOGRAM REPORT   Patient Name:   [name] Date of Exam: 03/11/2024 Medical Rec #:  [id number]    Height:       63.0 in Accession #:    [id number]   Weight:       144.6 lb Date of Birth:  01/02/1933   BSA:          1.685 m Patient Age:    89 years     BP:           133/81 mmHg Patient Gender: F            HR:           60  bpm. Exam Location:  Inpatient Procedure: 2D Echo, Cardiac Doppler and Color Doppler (Both Spectral and Color            Flow Doppler were utilized during procedure). Indications:    Chest Pain R07.9  History:        Patient has prior history of Echocardiogram examinations, most                 recent 04/14/2023. CHF, Previous Myocardial Infarction, Prior                 CABG; Risk Factors:Diabetes.  Sonographer:    Darlys Gales Referring Phys: [id number] CALLIE E GOODRICH IMPRESSIONS  1. Left ventricular ejection fraction, by estimation, is 65 to 70%. The left ventricle has normal function. The left ventricle has no regional wall motion abnormalities. Left ventricular diastolic parameters are consistent with Grade I diastolic dysfunction (impaired relaxation).  2. Right ventricular systolic function is normal. The right ventricular size is normal.  3. Left atrial size was moderately dilated.  4. The mitral valve is normal in structure. No evidence of mitral valve regurgitation. No evidence of mitral stenosis.  5. The aortic valve is tricuspid. Aortic valve regurgitation is not visualized. No aortic stenosis is present.  6. The inferior vena cava is normal in size with greater than 50% respiratory variability, suggesting right atrial pressure of 3 mmHg. Comparison(s): No significant change from prior study. Prior images reviewed side by side. FINDINGS  Left Ventricle: Left ventricular ejection fraction, by estimation, is 65 to 70%. The left ventricle has normal function. The left ventricle has no regional  wall motion abnormalities. The left ventricular internal cavity size was normal in size. There is  no left ventricular hypertrophy. Abnormal (paradoxical) septal motion consistent with post-operative status. Left ventricular diastolic parameters are consistent with Grade I diastolic dysfunction (impaired relaxation). Normal left ventricular filling pressure. Right Ventricle: The right ventricular size is normal. No increase in right ventricular wall thickness. Right ventricular systolic function is normal. Left Atrium: Left atrial size was moderately dilated. Right Atrium: Right atrial size was normal in size. Pericardium: There is no evidence of pericardial effusion. Mitral Valve: The mitral valve is normal in structure. Mild to moderate mitral annular calcification. No evidence of mitral valve regurgitation. No evidence of mitral valve stenosis. Tricuspid Valve: The tricuspid valve is normal in structure. Tricuspid valve regurgitation is mild. Aortic Valve: The aortic valve is tricuspid. Aortic valve regurgitation is not visualized. No aortic stenosis is present. Aortic valve mean gradient measures 5.0 mmHg. Aortic valve peak gradient measures 6.7 mmHg. Aortic valve area, by VTI measures 2.12 cm. Pulmonic Valve: The pulmonic valve was normal in structure. Pulmonic valve regurgitation is not visualized. No evidence of pulmonic stenosis. Aorta: The aortic root is normal in size and structure. Venous: The inferior vena cava is normal in size with greater than 50% respiratory variability, suggesting right atrial pressure of 3 mmHg. IAS/Shunts: No atrial level shunt detected by color flow Doppler.  LEFT VENTRICLE PLAX 2D LVIDd:         4.20 cm   Diastology LVIDs:         3.00 cm   LV e' medial:    6.20 cm/s LV PW:         0.90 cm   LV E/e' medial:  10.0 LV IVS:        1.10 cm   LV e' lateral:   6.96 cm/s LVOT diam:     1.95 cm  LV E/e' lateral: 8.9 LV SV:         59 LV SV Index:   35 LVOT Area:     2.99 cm  LEFT  ATRIUM             Index        RIGHT ATRIUM          Index LA Vol (A2C):   32.8 ml 19.47 ml/m  RA Area:     9.74 cm LA Vol (A4C):   45.6 ml 27.07 ml/m  RA Volume:   18.30 ml 10.86 ml/m LA Biplane Vol: 40.3 ml 23.92 ml/m  AORTIC VALVE AV Area (Vmax):    2.55 cm AV Area (Vmean):   2.12 cm AV Area (VTI):     2.12 cm AV Vmax:           129.00 cm/s AV Vmean:          102.000 cm/s AV VTI:            0.279 m AV Peak Grad:      6.7 mmHg AV Mean Grad:      5.0 mmHg LVOT Vmax:         110.00 cm/s LVOT Vmean:        72.300 cm/s LVOT VTI:          0.198 m LVOT/AV VTI ratio: 0.71  AORTA Ao Root diam: 3.10 cm MITRAL VALVE                TRICUSPID VALVE MV Area (PHT): 1.86 cm     TR Peak grad:   20.6 mmHg MV Decel Time: 408 msec     TR Vmax:        227.00 cm/s MV E velocity: 61.80 cm/s MV A velocity: 100.00 cm/s  SHUNTS MV E/A ratio:  0.62         Systemic VTI:  0.20 m                             Systemic Diam: 1.95 cm Rachelle Hora Croitoru MD Electronically signed by Thurmon Fair MD Signature Date/Time: 03/11/2024/1:56:27 PM    Final    DG Chest 2 View Result Date: 03/10/2024 CLINICAL DATA:  Chest pain EXAM: CHEST - 2 VIEW COMPARISON:  11/19/2023 FINDINGS: Low lung volumes. Sternotomy and CABG. Aortic atherosclerotic calcification. Left basilar atelectasis or infiltrates. No pleural effusion or pneumothorax. No displaced rib fractures. Elevated right hemidiaphragm. IMPRESSION: Low lung volumes with left basilar atelectasis or infiltrates. Electronically Signed   By: Minerva Fester M.D.   On: 03/10/2024 21:42    Microbiology: Results for orders placed or performed during the hospital encounter of 03/10/24  MRSA Next Gen by PCR, Nasal     Status: None   Collection Time: 03/10/24  9:16 PM   Specimen: Nasal Mucosa; Nasal Swab  Result Value Ref Range Status   MRSA by PCR Next Gen NOT DETECTED NOT DETECTED Final    Comment: (NOTE) The GeneXpert MRSA Assay (FDA approved for NASAL specimens only), is one component of  a comprehensive MRSA colonization surveillance program. It is not intended to diagnose MRSA infection nor to guide or monitor treatment for MRSA infections. Test performance is not FDA approved in patients less than 85 years old. Performed at [facility], [address], [place], [place] [address]     Labs: CBC: Recent Labs  Lab 03/10/24 2100 03/11/24 0842  WBC 7.1  4.6  HGB 12.3 10.7*  HCT 41.0 34.7*  MCV 89.5 89.0  PLT 228 205   Basic Metabolic Panel: Recent Labs  Lab 03/10/24 2100 03/11/24 0842  NA 135 136  K 4.6 4.0  CL 100 102  CO2 26 26  GLUCOSE 262* 218*  BUN 16 19  CREATININE 1.19* 1.16*  CALCIUM 9.2 9.1  MG  --  2.1  PHOS  --  5.2*   Liver Function Tests: No results for input(s): "AST", "ALT", "ALKPHOS", "BILITOT", "PROT", "ALBUMIN" in the last 168 hours. CBG: Recent Labs  Lab 03/13/24 1943 03/14/24 0038 03/14/24 0436 03/14/24 0805 03/14/24 1101  GLUCAP 132* 131* 123* 173* 162*    Discharge time spent: approximately 45 minutes spent on discharge counseling, evaluation of patient on day of discharge, and coordination of discharge planning with nursing, social work, pharmacy and case management  Signed: Alberteen Sam, MD [facility] 03/14/2024

## 2024-03-14 NOTE — Progress Notes (Signed)
 Occupational Therapy Treatment Patient Details Name: [name] MRN: [id number] DOB: 08/25/1933 Today's Date: 03/14/2024   History of present illness Pt is an 89 y/o F presenting to ED on 3/29 with chest pain and hypertension. Brief episode of syncope/unresponsiveness with SBP in the 70s upon EMS arrival. CTA chest negative for aortic dissection, but 80% celiac artery stenosis incidental finding. Chronic dissection of L celiac artery. PMH includes CAD, CABG, PAD, HFpEF, HTN, HLD, diabetes, peptic ulcer disease, RA   OT comments  Pt is making continued progress towards acute OT goals. Pt continues to be limited by deficits listed below. Pt received standing at sink engaging in ADLs. Pt completed UBB, LBB, and grooming tasks while standing at sink with supervision. Pt tolerated standing at sink ~12 minutes while engaging in task without requiring a rest break. No LOB observed. Pt asymptomatic throughout but reported increased fatigue. Pt educated on energy conservation strategies to increase pt safety while engaging in functional tasks. Pt verbalized understanding. Pt completed functional mobility in room without use of AD with CGA for safety. OT will continue following pt acutely to address functional needs with discharge recommendations of HH OT to ensure safe discharge to home environment.      If plan is discharge home, recommend the following:  A little help with walking and/or transfers;A little help with bathing/dressing/bathroom;Assistance with cooking/housework;Assist for transportation;Help with stairs or ramp for entrance   Equipment Recommendations  None recommended by OT    Recommendations for Other Services PT consult    Precautions / Restrictions Precautions Precautions: Fall Recall of Precautions/Restrictions: Intact Precaution/Restrictions Comments: watch BP Restrictions Weight Bearing Restrictions Per Provider Order: No       Mobility Bed Mobility Overal bed mobility:  Needs Assistance             General bed mobility comments: NT; Pt received standing at sink upon arrival    Transfers Overall transfer level: Needs assistance                 General transfer comment: Pt ambulated from sink and transferred to recliner without use of AD. CGA provided for safety. Pt observed reaching for foot  board for balance while ambulating     Balance Overall balance assessment: Needs assistance Sitting-balance support: Feet supported Sitting balance-Leahy Scale: Good Sitting balance - Comments: static sitting in chair   Standing balance support: No upper extremity supported, During functional activity Standing balance-Leahy Scale: Fair Standing balance comment: Pt engaged in ADLs standing at sink. Pt required support of at least one UE when performing functional bending and reaching. No LOB observed                           ADL either performed or assessed with clinical judgement   ADL Overall ADL's : Needs assistance/impaired     Grooming: Supervision/safety;Wash/dry face;Wash/dry hands;Oral care;Standing   Upper Body Bathing: Supervision/ safety;Standing   Lower Body Bathing: Supervison/ safety;Sit to/from stand   Upper Body Dressing : Contact guard assist   Lower Body Dressing: Supervision/safety;Sit to/from stand Lower Body Dressing Details (indicate cue type and reason): Pt able to manage undergarment below knee level and up to waist without while standing supported unilaterally at sink             Functional mobility during ADLs: Contact guard assist General ADL Comments: Supervision provided for safety while standing at sink to engage in morning ADLs. Pt educated on energy conservation  strategies to increase safety and reduce risk of falls. Pt verbalized understanding    Extremity/Trunk Assessment Upper Extremity Assessment Upper Extremity Assessment: Generalized weakness   Lower Extremity Assessment Lower  Extremity Assessment: Defer to PT evaluation        Vision   Vision Assessment?: No apparent visual deficits   Perception Perception Perception: Not tested   Praxis Praxis Praxis: Not tested   Communication Communication Communication: Impaired Factors Affecting Communication: Hearing impaired   Cognition Arousal: Alert Behavior During Therapy: WFL for tasks assessed/performed Cognition: No apparent impairments             OT - Cognition Comments: Pt followed verbal commands, able to express wants/needs.                 Following commands: Intact        Cueing   Cueing Techniques: Verbal cues  Exercises      Shoulder Instructions       General Comments VSS on RA; Pt asymptomatic while engaging in self-care tasks    Pertinent Vitals/ Pain       Pain Assessment Pain Assessment: Faces Faces Pain Scale: Hurts a little bit Pain Location: back Pain Descriptors / Indicators: Discomfort Pain Intervention(s): Monitored during session  Home Living                                          Prior Functioning/Environment              Frequency  Min 2X/week        Progress Toward Goals  OT Goals(current goals can now be found in the care plan section)  Progress towards OT goals: Progressing toward goals  Acute Rehab OT Goals Patient Stated Goal: none stated OT Goal Formulation: With patient Time For Goal Achievement: 03/26/24 Potential to Achieve Goals: Good ADL Goals Pt Will Perform Upper Body Dressing: with modified independence;sitting Pt Will Perform Lower Body Dressing: with modified independence;sitting/lateral leans;sit to/from stand Pt Will Transfer to Toilet: with modified independence;ambulating;regular height toilet Pt Will Perform Tub/Shower Transfer: Tub transfer;Shower transfer;Independently;ambulating Additional ADL Goal #1: pt will tolerate OOB activity x15 min in order to improve activity tolerance for ADLs   Plan      Co-evaluation                 AM-PAC OT "6 Clicks" Daily Activity     Outcome Measure   Help from another person eating meals?: A Little Help from another person taking care of personal grooming?: A Little Help from another person toileting, which includes using toliet, bedpan, or urinal?: A Little Help from another person bathing (including washing, rinsing, drying)?: A Little Help from another person to put on and taking off regular upper body clothing?: A Little Help from another person to put on and taking off regular lower body clothing?: A Little 6 Click Score: 18    End of Session    OT Visit Diagnosis: Unsteadiness on feet (R26.81);Other abnormalities of gait and mobility (R26.89);Muscle weakness (generalized) (M62.81);History of falling (Z91.81)   Activity Tolerance Patient tolerated treatment well   Patient Left in chair;with call bell/phone within reach   Nurse Communication Mobility status        Time: 6606-3016 OT Time Calculation (min): 21 min  Charges: OT General Charges $OT Visit: 1 Visit OT Treatments $Self Care/Home Management : 8-22 mins  Jeffery Bachmeier  Theodora Blow 03/14/2024, 9:35 AM

## 2024-03-14 NOTE — Plan of Care (Signed)
  Problem: Clinical Measurements: Goal: Will remain free from infection Outcome: Progressing Goal: Diagnostic test results will improve Outcome: Progressing Goal: Cardiovascular complication will be avoided Outcome: Progressing   Problem: Activity: Goal: Risk for activity intolerance will decrease Outcome: Progressing   Problem: Nutrition: Goal: Adequate nutrition will be maintained Outcome: Progressing   Problem: Coping: Goal: Level of anxiety will decrease Outcome: Progressing   Problem: Safety: Goal: Ability to remain free from injury will improve Outcome: Progressing

## 2024-03-15 ENCOUNTER — Telehealth: Payer: Self-pay

## 2024-03-15 NOTE — Transitions of Care (Post Inpatient/ED Visit) (Signed)
 03/15/2024  Name: [name] MRN: [id number] DOB: 1933/02/20  Today's TOC FU Call Status: Today's TOC FU Call Status:: Successful TOC FU Call Completed TOC FU Call Complete Date: 03/15/24 Patient's Name and Date of Birth confirmed.  Transition Care Management Follow-up Telephone Call Date of Discharge: 03/14/24 Discharge Facility: [facility] G[facility]) Type of Discharge: Inpatient Admission Primary Inpatient Discharge Diagnosis:: Chest Pain, Hypertension How have you been since you were released from the hospital?: Better (Still feels a little weak) Any questions or concerns?: No  Items Reviewed: Did you receive and understand the discharge instructions provided?: Yes (Needs to review and read again to get medication straight) Medications obtained,verified, and reconciled?: Yes (Medications Reviewed) Any new allergies since your discharge?: No Dietary orders reviewed?: Yes Type of Diet Ordered:: Low Sodium Heart healthy Do you have support at home?: No (Lives alone)  Medications Reviewed Today: Medications Reviewed Today     Reviewed by Redge Gainer, RN (Case Manager) on 03/15/24 at 1141  Med List Status: <None>   Medication Order Taking? Sig Documenting Provider Last Dose Status Informant  acetaminophen (TYLENOL) 650 MG CR tablet [id number] No Take 650 mg by mouth daily as needed for pain. [provider] 03/10/2024 Noon Active Self, Pharmacy Records  carvedilol (COREG) 3.125 MG tablet [id number]  Take 1 tablet (3.125 mg total) by mouth 2 (two) times daily with a meal. Danford, Earl Lites, MD  Active   Cholecalciferol (VITAMIN D3) 10 MCG (400 UNIT) tablet [id number] No Take 400 Units by mouth daily. [provider] Past Week Active Self, Pharmacy Records           Med Note [facility], Corinna Gab May 04, 2023  9:40 PM) Taken occasionally  clopidogrel (PLAVIX) 75 MG tablet [id number] No Take 75 mg by mouth at bedtime. [provider] 03/08/2024 Active Self,  Pharmacy Records           Med Note Lia Hopping, [facility]   Tue Oct 19, 2022 11:10 PM)    denosumab (PROLIA) 60 MG/ML SOSY injection [id number] No Inject 60 mg into the skin every 6 (six) months. [provider] Taking Active Self, Pharmacy Records           Med Note [facility], Cynda Acres Mar 11, 2024 12:13 AM)    ezetimibe (ZETIA) 10 MG tablet [id number] No Take 10 mg by mouth at bedtime. [provider] 03/09/2024 Active Self, Pharmacy Records           Med Note [facility], Cynda Acres Mar 11, 2024 12:13 AM)    folic acid (FOLVITE) 1 MG tablet [id number] No Take 1 mg by mouth daily. [provider] 03/10/2024 Morning Active Self, Pharmacy Records  gabapentin (NEURONTIN) 400 MG capsule [id number] No Take 400 mg by mouth 2 (two) times daily. [provider] 03/10/2024 Morning Active Self, Pharmacy Records  glimepiride (AMARYL) 2 MG tablet [id number] No Take 2 mg by mouth daily with breakfast. [provider] 03/10/2024 Morning Active Self, Pharmacy Records  hydrALAZINE (APRESOLINE) 25 MG tablet [id number]  Take 1 tablet (25 mg total) by mouth every 8 (eight) hours. Danford, Earl Lites, MD  Active   isosorbide mononitrate (IMDUR) 60 MG 24 hr tablet [id number]  Take 1 tablet (60 mg total) by mouth daily. Alberteen Sam, MD  Active   LORazepam (ATIVAN) 0.5 MG tablet [id number] No 1 tablet Orally Once a day as needed for anxiety for 90 days [provider] Unknown  Active Self, Pharmacy Records  methotrexate (RHEUMATREX) 2.5 MG tablet [id number] No Take 20 mg by mouth every Tuesday. Take 8 tablets by mouth every Tuesday  Caution:Chemotherapy. Protect from light. [provider] 03/06/2024 Active            Med Note (WHITE, Cynda Acres Mar 11, 2024 12:41 AM) Patient said that this was restarted after Sulfasalazine was discontinued.  Multiple Vitamin (MULTIVITAMIN) capsule [id number] No Take 1 capsule by mouth daily. [provider] Past  Week Active Self, Pharmacy Records  nitroGLYCERIN (NITROSTAT) 0.4 MG SL tablet [id number] No PLACE 1 TABLET UNDER THE TONGUE EVERY 5 MINUTES AS NEEDED FOR CHEST PAIN. Runell Gess, MD 03/10/2024 Evening Active Self, Pharmacy Records  ondansetron (ZOFRAN-ODT) 8 MG disintegrating tablet [id number] No Take 8 mg by mouth every 8 (eight) hours as needed for nausea. [provider] Past Week Active Self, Pharmacy Records  predniSONE (DELTASONE) 1 MG tablet [id number] No Take 4 mg by mouth daily. [provider] 03/10/2024 Morning Active Self, Pharmacy Records  rosuvastatin (CRESTOR) 20 MG tablet [id number] No Take 20 mg by mouth daily. [provider] 03/09/2024 Active Self, Pharmacy Records            Home Care and Equipment/Supplies: Were Home Health Services Ordered?: NA Any new equipment or medical supplies ordered?: NA  Functional Questionnaire: Do you need assistance with bathing/showering or dressing?: No Do you need assistance with meal preparation?: No Do you need assistance with eating?: No Do you have difficulty maintaining continence: No Do you need assistance with getting out of bed/getting out of a chair/moving?: No Do you have difficulty managing or taking your medications?: No  Follow up appointments reviewed: PCP Follow-up appointment confirmed?: No (The patient will call provider to schedule) MD Provider Line Number:[phone] Given: No Specialist Hospital Follow-up appointment confirmed?: No Reason Specialist Follow-Up Not Confirmed: Patient has Specialist Provider Number and will Call for Appointment Do you need transportation to your follow-up appointment?: No Do you understand care options if your condition(s) worsen?: Yes-patient verbalized understanding  SDOH Interventions Today    Flowsheet Row Most Recent Value  SDOH Interventions   Food Insecurity Interventions Intervention Not Indicated  Housing Interventions Intervention Not  Indicated  Transportation Interventions Intervention Not Indicated  Utilities Interventions Intervention Not Indicated       Goals Addressed             This Visit's Progress    Patient Stated       Current Barriers:  Chronic Disease Management support and education needs related to HTN   RNCM Clinical Goal(s):  Patient will work with the Care Management team over the next 30 days to address Transition of Care Barriers: Medication access Medication Management Diet/Nutrition/Food Resources Support at home Provider appointments Functional/Safety verbalize understanding of plan for management of HTN as evidenced by No hospital admissions in the next 30 days  through collaboration with RN Care manager, provider, and care team.   Interventions: Evaluation of current treatment plan related to  self management and patient's adherence to plan as established by provider   Hypertension Interventions:  (Status:  New goal.) Short Term Goal Last practice recorded BP readings:  BP Readings from Last 3 Encounters:  03/14/24 120/62  02/28/24 90/68  01/17/24 (!) 80/52   Most recent eGFR/CrCl:  Lab Results  Component Value Date   EGFR 60 03/02/2024      Evaluation of current treatment plan related to hypertension self management and patient's  adherence to plan as established by provider Reviewed medications with patient and discussed importance of compliance Discussed plans with patient for ongoing care management follow up and provided patient with direct contact information for care management team Reviewed scheduled/upcoming provider appointments including:  Advised patient to discuss blood pressure readings with provider  Patient Goals/Self-Care Activities: Participate in Transition of Care Program/Attend [facility] scheduled calls Notify RN Care Manager of [facility] call rescheduling needs Take all medications as prescribed Attend all scheduled provider appointments Call pharmacy for  medication refills 3-7 days in advance of running out of medications Perform all self care activities independently   Follow Up Plan:  Telephone follow up appointment with care management team member scheduled for:  Thursday April 10th at 11:00am         The patient was hospitalized for Chest Pain and Hypertensive emergency. She has had medication adjustments in the last couple of months due to unstable blood pressure. Her medications were changed. Reviewed with the patient who manages her own medication. She lives alone and drives and is independent with ADL's. The patient had been going to Outpatient Physical Therapy for balance which she will call and set up again. She agreed to the 30 Day [employer].  Patient verbalizes understanding of instructions and care plan provided. Patient was encouraged to make informed decisions about their care, actively participate in managing their health condition, and implement lifestyle changes as needed to promote independence and self-management of healthcare.  Effectiveness of interventions, symptom management and outcomes will be evaluated weekly.  The patient has been provided with contact information for the care management team and has been advised to call with any health-related questions or concerns. The patient verbalized understanding with current POC. The patient is directed to their insurance card regarding availability of benefits coverage   Deidre Ala, BSN, RN [place]  VBCI - [facility] RN Care Manager [phone]

## 2024-03-22 ENCOUNTER — Other Ambulatory Visit

## 2024-03-22 NOTE — Patient Instructions (Signed)
 Visit Information  Thank you for taking time to visit with me today. Please don't hesitate to contact me if I can be of assistance to you before our next scheduled telephone appointment.  Our next appointment is by telephone on Tuesday April 15th at 3:00pm  Following is a copy of your care plan:   Goals Addressed             This Visit's Progress    VBCI TOC Care Plan   On track    Current Barriers: (reviewed 03/22/24) Chronic Disease Management support and education needs related to HTN    RNCM Clinical Goal(s):  (reviewed 03/22/24) Patient will work with the Care Management team over the next 30 days to address Transition of Care Barriers: Medication access Medication Management Diet/Nutrition/Food Resources Support at home Provider appointments Functional/Safety verbalize understanding of plan for management of HTN as evidenced by No hospital admissions in the next 30 days  through collaboration with RN Care manager, provider, and care team.   Interventions:   (reviewed 03/22/24) Evaluation of current treatment plan related to  self management and patient's adherence to plan as established by provider   Hypertension Interventions:  (Status:  New goal.) Short Term Goal Last practice recorded BP readings:  BP Readings from Last 3 Encounters:  03/14/24 120/62  02/28/24 90/68  01/17/24 (!) 80/52   Most recent eGFR/CrCl:  Lab Results  Component Value Date   EGFR 60 03/02/2024      Evaluation of current treatment plan related to hypertension self management and patient's adherence to plan as established by provider Reviewed medications with patient and discussed importance of compliance Discussed plans with patient for ongoing care management follow up and provided patient with direct contact information for care management team Reviewed scheduled/upcoming provider appointments including:  Advised patient to discuss blood pressure readings with provider  Patient  Goals/Self-Care Activities:   (reviewed 03/22/24)  Participate in Transition of Care Program/Attend [facility] scheduled calls Notify RN Care Manager of [facility] call rescheduling needs Take all medications as prescribed Attend all scheduled provider appointments Call pharmacy for medication refills 3-7 days in advance of running out of medications Perform all self care activities independently   Follow Up Plan:  Telephone follow up appointment with care management team member scheduled for:  Tuesday April 15th at 3:00pm          Patient verbalizes understanding of instructions and care plan provided today and agrees to view in [facility]. Active MyChart status and patient understanding of how to access instructions and care plan via MyChart confirmed with patient.     The patient has been provided with contact information for the care management team and has been advised to call with any health related questions or concerns.   Please call the care guide team at [phone] if you need to cancel or reschedule your appointment.   Please call the Suicide and Crisis Lifeline: 988 call the [place] National Suicide Prevention Lifeline: 7757656630 or TTY: 713-252-6373 TTY (903)548-3896) to talk to a trained counselor if you are experiencing a Mental Health or Behavioral Health Crisis or need someone to talk to.  Deidre Ala, BSN, RN [place]  VBCI - [employer] [facility] RN Care Manager [phone]

## 2024-03-22 NOTE — Transitions of Care (Post Inpatient/ED Visit) (Signed)
 Transition of Care week 2  Visit Note  03/22/2024  Name: [name] MRN: [id number]          DOB: May 24, 1933  Situation: Patient enrolled in [facility] 30-day program. Visit completed with Inda Castle by telephone.   Background:     Past Medical History:  Diagnosis Date   Allergy to IVP dye    Angina, class I (HCC) 04/22/2015   Atherosclerotic heart disease native coronary artery w/angina pectoris (HCC) 1992   a. 1992 s/p PTCA of LAD;  b. 1998 CABG x 3; c. 07/2001 Cath: Sev LM/LAD/LCX dzs, 3/3 patent grafts; d. 11/2013 Cath: stable graft anatomy; e. 10/2016 Cath: native 3VD, VG->OM nl, LIMA->LAD nl, VG->Diag 55.   Atherosclerotic heart disease of native coronary artery with angina pectoris (HCC) 01/14/2010   Qualifier: Diagnosis of  By: Arlyce Dice MD, Barbette Hair    Atrial septal aneurysm    Barrett's esophagus 03/04/2010   Qualifier: Diagnosis of  By: Myrtie Hawk, Amy S    Bilateral leg edema 04/22/2015   Bradycardia, mild briefly to the 40s, asymptomatic 05/16/2013   Carotid arterial disease (HCC)    a. 05/2014 Carotid U/S: No signif bilat ICA stenosis, >60% L ECA.   Chronic anemia    Chronic diastolic CHF (congestive heart failure) (HCC)    a. 10/2016 Echo: Ef 55-60%, no rwma, Gr1 DD, triv AI, PASP , atrial septal aneurysm.   Chronic diastolic CHF (congestive heart failure), NYHA class 2 (HCC) 08/11/2017   Claudication (HCC) 05/16/2013   Peripheral arterial occlusive disease with lifestyle limiting claudication    Degenerative joint disease 05/30/2018   DIVERTICULOSIS OF COLON 01/14/2010   Qualifier: Diagnosis of  By: Arlyce Dice MD, Barbette Hair    DM (diabetes mellitus), type 2 with peripheral vascular complications (HCC)    On Oral medications.   DOE (dyspnea on exertion) 04/22/2015   Essential hypertension    FECAL INCONTINENCE 03/04/2010   Qualifier: Diagnosis of  By: Monica Becton PA-c, Amy S    GERD (gastroesophageal reflux disease)    Hyperlipidemia with target LDL less than 70     Inflammatory arthritis 10/13/2017   Neuropathy    Non-insulin dependent type 2 diabetes mellitus (HCC) 10/24/2015   NSTEMI (non-ST elevated myocardial infarction) (HCC) 11/01/2016   PAD (peripheral artery disease) (HCC) 05/2013; 09/2013   a. severe calcified POP-1 & TP Trunk Dz bilat;  b . 05/2013 Diamondback Rot Athrectomy (R POP) --> PTA R Pop, DES to R Peroneal;  c. 09/2013 PTA/Stenting of L Pop Clemencia Course - retrograde access);  d. 04/2014 ABI's: R/L: 1.1/1.1.   Preoperative cardiovascular examination 04/15/2020   PUD (peptic ulcer disease)    Rheumatoid arthritis (HCC)    S/P CABG x 3 1998   LIMA-LAD, SVG-OM, SVG-D1   Severe claudication (HCC) 05/2013   Referred for Peripheral Angio (Dr. Allyson Sabal --> Dr. Hoy Finlay in [place])   [facility] ALLERGY 01/14/2010   Qualifier: Diagnosis of  By: Candice Camp CMA (AAMA), [name]     Status post total replacement of right hip 10/12/2018   Vertigo 01/02/2014    Assessment: Brief phone call to the patient as she was getting ready to go to the RA provider. She states she did see her PCP this week and no changes were made to medication and nothing further to report. She states she has not been feeling good and not in her usual state of health and has many doctor's appointments coming up and is hopeful one of the providers will pinpoint the  reason. She usually drives but is not driving currently due to weakness. She has not scheduled her outpatient PT because she does not feel strong enough to attend.  Patient Reported Symptoms:  Cognitive Alert and oriented to person, place, and time  Neurological No symptoms reported    HEENT No symptoms reported    Cardiovascular Other:, Fatigue weakness  Respiratory No symptoms reported    Endocrine No symptoms reported    Gastrointestinal No symptoms reported    Genitourinary No symptoms reported    Integumentary No symptoms reported    Musculoskeletal Unsteady gait, Weakness    Psychosocial No symptoms  reported     There were no vitals filed for this visit.  Medications Reviewed Today     Reviewed by Redge Gainer, RN (Case Manager) on 03/22/24 at 1454  Med List Status: <None>   Medication Order Taking? Sig Documenting Provider Last Dose Status Informant  acetaminophen (TYLENOL) 650 MG CR tablet [id number] No Take 650 mg by mouth daily as needed for pain. [provider] 03/10/2024 Noon Active Self, Pharmacy Records  carvedilol (COREG) 3.125 MG tablet [id number]  Take 1 tablet (3.125 mg total) by mouth 2 (two) times daily with a meal. Danford, Earl Lites, MD  Active   Cholecalciferol (VITAMIN D3) 10 MCG (400 UNIT) tablet [id number] No Take 400 Units by mouth daily. [provider] Past Week Active Self, Pharmacy Records           Med Note [facility], Corinna Gab May 04, 2023  9:40 PM) Taken occasionally  clopidogrel (PLAVIX) 75 MG tablet [id number] No Take 75 mg by mouth at bedtime. [provider] 03/08/2024 Active Self, Pharmacy Records           Med Note Lia Hopping, [facility] N   Tue Oct 19, 2022 11:10 PM)    denosumab (PROLIA) 60 MG/ML SOSY injection [id number] No Inject 60 mg into the skin every 6 (six) months. [provider] Taking Active Self, Pharmacy Records           Med Note [facility], Cynda Acres Mar 11, 2024 12:13 AM)    ezetimibe (ZETIA) 10 MG tablet [id number] No Take 10 mg by mouth at bedtime. [provider] 03/09/2024 Active Self, Pharmacy Records           Med Note [facility], Cynda Acres Mar 11, 2024 12:13 AM)    folic acid (FOLVITE) 1 MG tablet [id number] No Take 1 mg by mouth daily. [provider] 03/10/2024 Morning Active Self, Pharmacy Records  gabapentin (NEURONTIN) 400 MG capsule [id number] No Take 400 mg by mouth 2 (two) times daily. [provider] 03/10/2024 Morning Active Self, Pharmacy Records  glimepiride (AMARYL) 2 MG tablet [id number] No Take 2 mg by mouth daily with breakfast. [provider]  03/10/2024 Morning Active Self, Pharmacy Records  hydrALAZINE (APRESOLINE) 25 MG tablet [id number]  Take 1 tablet (25 mg total) by mouth every 8 (eight) hours. Danford, Earl Lites, MD  Active   isosorbide mononitrate (IMDUR) 60 MG 24 hr tablet [id number]  Take 1 tablet (60 mg total) by mouth daily. Alberteen Sam, MD  Active   LORazepam (ATIVAN) 0.5 MG tablet [id number] No 1 tablet Orally Once a day as needed for anxiety for 90 days [provider] Unknown Active Self, Pharmacy Records  methotrexate (RHEUMATREX) 2.5 MG tablet [id number] No Take 20 mg by mouth every Tuesday. Take 8 tablets by mouth every Tuesday  Caution:Chemotherapy. Protect from  light. [provider] 03/06/2024 Active            Med Note (WHITE, Cynda Acres Mar 11, 2024 12:41 AM) Patient said that this was restarted after Sulfasalazine was discontinued.  Multiple Vitamin (MULTIVITAMIN) capsule [id number] No Take 1 capsule by mouth daily. [provider] Past Week Active Self, Pharmacy Records  nitroGLYCERIN (NITROSTAT) 0.4 MG SL tablet [id number] No PLACE 1 TABLET UNDER THE TONGUE EVERY 5 MINUTES AS NEEDED FOR CHEST PAIN. Runell Gess, MD 03/10/2024 Evening Active Self, Pharmacy Records  ondansetron (ZOFRAN-ODT) 8 MG disintegrating tablet [id number] No Take 8 mg by mouth every 8 (eight) hours as needed for nausea. [provider] Past Week Active Self, Pharmacy Records  predniSONE (DELTASONE) 1 MG tablet [id number] No Take 4 mg by mouth daily. [provider] 03/10/2024 Morning Active Self, Pharmacy Records  rosuvastatin (CRESTOR) 20 MG tablet [id number] No Take 20 mg by mouth daily. [provider] 03/09/2024 Active Self, Pharmacy Records            Recommendation:   Follow up with RA provider Recommended use of a cane for unsteady gait Discuss further Fall prevention Schedule outpatient PT  Follow Up Plan:   Telephone follow-up in 1 week  Deidre Ala, BSN,  RN [place]  VBCI - [facility] RN Care Manager [phone]

## 2024-03-27 ENCOUNTER — Other Ambulatory Visit: Payer: Self-pay

## 2024-03-27 NOTE — Transitions of Care (Post Inpatient/ED Visit) (Signed)
 Care Management  Transitions of Care Program Transitions of Care Post-discharge week 3  03/27/2024 Name: [name] MRN: [id number] DOB: [birth year]  Subjective: [name] is a 89 y.o. year old female who is a primary care patient of Pete Brand, DO. The Care Management team was unable to reach the patient by phone to assess and address transitions of care needs.   Plan: Additional outreach attempts will be made to reach the patient enrolled in the [facility] Program (Post Inpatient/ED Visit).  Gareld June, BSN, RN [place]  VBCI - [employer] [facility] RN Care Manager [phone]

## 2024-03-28 ENCOUNTER — Telehealth: Payer: Self-pay

## 2024-03-28 NOTE — Transitions of Care (Post Inpatient/ED Visit) (Signed)
 Care Management  Transitions of Care Program Transitions of Care Post-discharge week 3  03/28/2024 Name: [name] MRN: [id number] DOB: [birth year]  Subjective: [name] is a 89 y.o. year old female who is a primary care patient of Pete Brand, DO. The Care Management team spoke with patient by telephone to assess and address transitions of care needs.   Plan: No further outreach attempts will be made at this time.  We have been unable to reach the patient.  The patient declines TOC Outreach this week. States to call her next week.   Gareld June, BSN, RN [place]  VBCI - [employer] [facility] RN Care Manager [phone]

## 2024-04-04 ENCOUNTER — Other Ambulatory Visit: Payer: Self-pay

## 2024-04-04 NOTE — Transitions of Care (Post Inpatient/ED Visit) (Signed)
 Transition of Care week 3  Visit Note  04/04/2024  Name: [name] MRN: [id number]          DOB: [birth year]  Situation: Patient enrolled in [facility] 30-day program. Visit completed with Dearl Fabry by telephone.   Background:     Past Medical History:  Diagnosis Date   Allergy to IVP dye    Angina, class I (HCC) 04/22/2015   Atherosclerotic heart disease native coronary artery w/angina pectoris (HCC) 1992   a. 1992 s/p PTCA of LAD;  b. 1998 CABG x 3; c. 07/2001 Cath: Sev LM/LAD/LCX dzs, 3/3 patent grafts; d. 11/2013 Cath: stable graft anatomy; e. 10/2016 Cath: native 3VD, VG->OM nl, LIMA->LAD nl, VG->Diag 55.   Atherosclerotic heart disease of native coronary artery with angina pectoris (HCC) 01/14/2010   Qualifier: Diagnosis of  By: Arvie Latus MD, Moishe Angel    Atrial septal aneurysm    Barrett's esophagus 03/04/2010   Qualifier: Diagnosis of  By: Shelva Dice, Amy S    Bilateral leg edema 04/22/2015   Bradycardia, mild briefly to the 40s, asymptomatic 05/16/2013   Carotid arterial disease (HCC)    a. 05/2014 Carotid U/S: No signif bilat ICA stenosis, >60% L ECA.   Chronic anemia    Chronic diastolic CHF (congestive heart failure) (HCC)    a. 10/2016 Echo: Ef 55-60%, no rwma, Gr1 DD, triv AI, PASP , atrial septal aneurysm.   Chronic diastolic CHF (congestive heart failure), NYHA class 2 (HCC) 08/11/2017   Claudication (HCC) 05/16/2013   Peripheral arterial occlusive disease with lifestyle limiting claudication    Degenerative joint disease 05/30/2018   DIVERTICULOSIS OF COLON 01/14/2010   Qualifier: Diagnosis of  By: Arvie Latus MD, Moishe Angel    DM (diabetes mellitus), type 2 with peripheral vascular complications (HCC)    On Oral medications.   DOE (dyspnea on exertion) 04/22/2015   Essential hypertension    FECAL INCONTINENCE 03/04/2010   Qualifier: Diagnosis of  By: Francisca Irvine PA-c, Amy S    GERD (gastroesophageal reflux disease)    Hyperlipidemia with target LDL less than 70     Inflammatory arthritis 10/13/2017   Neuropathy    Non-insulin  dependent type 2 diabetes mellitus (HCC) 10/24/2015   NSTEMI (non-ST elevated myocardial infarction) (HCC) 11/01/2016   PAD (peripheral artery disease) (HCC) 05/2013; 09/2013   a. severe calcified POP-1 & TP Trunk Dz bilat;  b . 05/2013 Diamondback Rot Athrectomy (R POP) --> PTA R Pop, DES to R Peroneal;  c. 09/2013 PTA/Stenting of L Pop [name] - retrograde access);  d. 04/2014 ABI's: R/L: 1.1/1.1.   Preoperative cardiovascular examination 04/15/2020   PUD (peptic ulcer disease)    Rheumatoid arthritis (HCC)    S/P CABG x 3 1998   LIMA-LAD, SVG-OM, SVG-D1   Severe claudication (HCC) 05/2013   Referred for Peripheral Angio (Dr. Katheryne Pane --> Dr. Delana Favors in [place])   [facility] ALLERGY 01/14/2010   Qualifier: Diagnosis of  By: Misty Amour CMA (AAMA), [name]     Status post total replacement of right hip 10/12/2018   Vertigo 01/02/2014    Assessment: TOC Outreach completed today. The patient is at home today and not feeling like her usual health. She is somewhat depressed because she cannot drive herself places right now because of weakness and fatigue and her husband is in a nursing home and she would like to visit him. She states he is non-ambulatory and she cannot care for him. She states that she is getting older and not recovering from  this hospitalization like she thought she would. Her blood pressure has been running high which does cause her some anxiety. The last reading she could remember was 150/80 but she states she is taking her medication correctly. She kept saying that "I will be fine and I have many docotor's appointments coming up". The patient does not manage her blood sugar because she doesn't like to stick her finger and is wondering is a Continuous blood sugar monitor is an option. She will discuss this with her provider.  Patient Reported Symptoms: Cognitive Cognitive Status: Alert and oriented to person, place,  and time      Neurological Neurological Review of Symptoms: Numbness Neurological Management Strategies: Medication therapy Neurological Self-Management Outcome: 3 (uncertain)  HEENT HEENT Symptoms Reported: No symptoms reported      Cardiovascular Cardiovascular Symptoms Reported: Lightheadness, Fatigue Other Cardiovascular Symptoms: weakness Does patient have uncontrolled Hypertension?: Yes Is patient checking Blood Pressure at home?: Yes Patient's Recent BP reading at home: 150/80 Cardiovascular Conditions: Hypertension, High blood cholesterol Cardiovascular Management Strategies: Medication therapy Weight: 145 lb (65.8 kg) Cardiovascular Self-Management Outcome: 3 (uncertain) Cardiovascular Comment: Her blood pressure tends to run high  Respiratory Respiratory Symptoms Reported: No symptoms reported    Endocrine Patient reports the following symptoms related to hypoglycemia or hyperglycemia : Weakness or fatigue Is patient diabetic?: Yes Is patient checking blood sugars at home?: No Endocrine Conditions: Diabetes Endocrine Management Strategies: Medication therapy Endocrine Self-Management Outcome: 3 (uncertain) Endocrine Comment: She wants a CBG monitor because she doesn't like to stick her finger  Gastrointestinal Gastrointestinal Symptoms Reported: No symptoms reported   Nutrition Risk Screen (CP): No indicators present  Genitourinary Genitourinary Symptoms Reported: No symptoms reported    Integumentary Integumentary Symptoms Reported: No symptoms reported    Musculoskeletal Musculoskelatal Symptoms Reviewed: Unsteady gait, Weakness Additional Musculoskeletal Details: Feels like she is not getting that much better with her weakness Musculoskeletal Conditions: Rheumatoid arthritis Musculoskeletal Management Strategies: Medication therapy Musculoskeletal Self-Management Outcome: 3 (uncertain)   Patient at Risk for Falls Due to: Impaired balance/gait, Impaired  mobility Fall risk Follow up: Falls prevention discussed  Psychosocial Psychosocial Symptoms Reported: No symptoms reported         There were no vitals filed for this visit.  Medications Reviewed Today     Reviewed by Claudene Crystal, RN (Case Manager) on 04/04/24 at 1317  Med List Status: <None>   Medication Order Taking? Sig Documenting Provider Last Dose Status Informant  acetaminophen  (TYLENOL ) 650 MG CR tablet [id number]  Take 650 mg by mouth daily as needed for pain. [provider]  Active Self, Pharmacy Records  carvedilol  (COREG ) 3.125 MG tablet [id number]  Take 1 tablet (3.125 mg total) by mouth 2 (two) times daily with a meal. Danford, Willis Harter, MD  Active   Cholecalciferol  (VITAMIN D3) 10 MCG (400 UNIT) tablet [id number]  Take 400 Units by mouth daily. [provider]  Active Self, Pharmacy Records           Med Note [facility], Vernona Goods May 04, 2023  9:40 PM) Taken occasionally  clopidogrel  (PLAVIX ) 75 MG tablet [id number]  Take 75 mg by mouth at bedtime. [provider]  Active Self, Pharmacy Records           Med Note Desiree Florence, [facility] N   Tue Oct 19, 2022 11:10 PM)    denosumab (PROLIA) 60 MG/ML SOSY injection [id number]  Inject 60 mg into the skin every 6 (six) months. [provider]  Active  Self, Pharmacy Records           Med Note [facility], Nieves Bars   Sun Mar 11, 2024 12:13 AM)    ezetimibe  (ZETIA ) 10 MG tablet [id number]  Take 10 mg by mouth at bedtime. [provider]  Active Self, Pharmacy Records           Med Note [facility], Anthony Kirk Mar 11, 2024 12:13 AM)    folic acid  (FOLVITE ) 1 MG tablet [id number]  Take 1 mg by mouth daily. [provider]  Active Self, Pharmacy Records  gabapentin  (NEURONTIN ) 400 MG capsule [id number]  Take 400 mg by mouth 2 (two) times daily. [provider]  Active Self, Pharmacy Records  glimepiride  (AMARYL ) 2 MG tablet [id number]  Take 2 mg by mouth daily with breakfast.  [provider]  Active Self, Pharmacy Records  hydrALAZINE  (APRESOLINE ) 25 MG tablet [id number]  Take 1 tablet (25 mg total) by mouth every 8 (eight) hours. Ephriam Hashimoto, MD  Active   isosorbide  mononitrate (IMDUR ) 60 MG 24 hr tablet [id number]  Take 1 tablet (60 mg total) by mouth daily. Ephriam Hashimoto, MD  Active   LORazepam  (ATIVAN ) 0.5 MG tablet [id number]  1 tablet Orally Once a day as needed for anxiety for 90 days [provider]  Active Self, Pharmacy Records  methotrexate  [facility]) 2.5 MG tablet [id number]  Take 20 mg by mouth every Tuesday. Take 8 tablets by mouth every Friday  Caution:Chemotherapy. Protect from light. [provider]  Active            Med Note (WHITE, Anthony Kirk Mar 11, 2024 12:41 AM) Patient said that this was restarted after Sulfasalazine  was discontinued.  Multiple Vitamin (MULTIVITAMIN) capsule [id number]  Take 1 capsule by mouth daily. [provider]  Active Self, Pharmacy Records  nitroGLYCERIN  (NITROSTAT ) 0.4 MG SL tablet [id number]  PLACE 1 TABLET UNDER THE TONGUE EVERY 5 MINUTES AS NEEDED FOR CHEST PAIN. Avanell Leigh, MD  Active Self, Pharmacy Records  ondansetron  (ZOFRAN -ODT) 8 MG disintegrating tablet [id number]  Take 8 mg by mouth every 8 (eight) hours as needed for nausea. [provider]  Active Self, Pharmacy Records  predniSONE  (DELTASONE ) 1 MG tablet [id number] No Take 4 mg by mouth daily.  Patient not taking: Reported on 04/04/2024   [provider] Not Taking Active Self, Pharmacy Records  rosuvastatin  (CRESTOR ) 20 MG tablet [id number]  Take 20 mg by mouth daily. [provider]  Active Self, Pharmacy Records            Recommendation:   Follow up with PCP for Continuous Glucose Monitor Continue to monitor Blood Pressure. Report consecutive readings over sytolic over 150 to PCP Schedule Outpatient PT to improve strength Take medication Lorazepam  as needed for  anxiety Fall risk prevention, stand slowly, gain balance before walker, do not walk if dizzy or lightheaded  Follow Up Plan:   Telephone follow-up in 1 week  Gareld June, BSN, RN   VBCI - [facility] RN Care Manager [phone]

## 2024-04-04 NOTE — Patient Instructions (Signed)
 Visit Information  Thank you for taking time to visit with me today. Please don't hesitate to contact me if I can be of assistance to you before our next scheduled telephone appointment.  Our next appointment is by telephone on Wednesday April 30th at 1:15pm  Following is a copy of your care plan:   Goals Addressed             This Visit's Progress    VBCI TOC Care Plan   On track    Current Barriers: (reviewed 04/04/24) Chronic Disease Management support and education needs related to HTN    RNCM Clinical Goal(s):  (reviewed 04/04/24) Patient will work with the Care Management team over the next 30 days to address Transition of Care Barriers: Medication access Medication Management Diet/Nutrition/Food Resources Support at home Provider appointments Functional/Safety verbalize understanding of plan for management of HTN as evidenced by No hospital admissions in the next 30 days  through collaboration with RN Care manager, provider, and care team.   Interventions:   (reviewed 04/04/24) Evaluation of current treatment plan related to  self management and patient's adherence to plan as established by provider   Hypertension Interventions:  (Status:  Goal on track:  Yes.) Short Term Goal Last practice recorded BP readings:  BP Readings from Last 3 Encounters:  03/14/24 120/62  02/28/24 90/68  01/17/24 (!) 80/52   Most recent eGFR/CrCl:  Lab Results  Component Value Date   EGFR 60 03/02/2024      Evaluation of current treatment plan related to hypertension self management and patient's adherence to plan as established by provider Reviewed medications with patient and discussed importance of compliance Discussed plans with patient for ongoing care management follow up and provided patient with direct contact information for care management team Reviewed scheduled/upcoming provider appointments including:  Advised patient to discuss blood pressure readings with  provider  Patient Goals/Self-Care Activities:   (reviewed 04/04/24)  Participate in Transition of Care Program/Attend [facility] scheduled calls Notify RN Care Manager of [facility] call rescheduling needs Take all medications as prescribed Attend all scheduled provider appointments Call pharmacy for medication refills 3-7 days in advance of running out of medications Perform all self care activities independently   Follow Up Plan:  Telephone follow up appointment with care management team member scheduled for:  Wednesday April 30th at 1:15pm          The patient has been provided with contact information for the care management team and has been advised to call with any health related questions or concerns.   Please call the care guide team at [phone] if you need to cancel or reschedule your appointment.   Please call the Suicide and Crisis Lifeline: 988 call the [place]  National Suicide Prevention Lifeline: 619-811-3397 or TTY: 916-734-5985 TTY 906-003-7526) to talk to a trained counselor if you are experiencing a Mental Health or Behavioral Health Crisis or need someone to talk to.  Gareld June, BSN, RN [place]  VBCI - [employer] [facility] RN Care Manager [phone]

## 2024-04-09 NOTE — Progress Notes (Signed)
 Cardiology Office Note:  .   Date:  04/20/2024  ID:  [name], DOB 1933-10-12, MRN [id number] PCP: Pete Brand, DO  [facility] Providers Cardiologist:  Randene Bustard, MD PV Cardiologist:  Lauro Portal, MD    History of Present Illness: [name]   [name] is a 89 y.o. female with a past medial history of CAD, PAD, HLD, HTN, history of CVA, GERD. Patient is followed by Dr. Katheryne Pane and presents today for a follow up appointment.    Patient previously had CABG in 1998. Later underwent left heart catheterization in 10/2016 that showed 55% stenosis in the origin of SVG-Diagonal, 55% stenosis in LM, 70% stenosis in ostial Ramus, patent SVG-OM, patent LIMA. Echocardiogram in 10/2016 showed EF 55-60%, no wall motion abnormalities, grade I DD. Her most recent ischemic evaluation was a nuclear stress test in 01/2022 that showed no evidence of ischemia or infarction. EF 61%. Her most recent echocardiogram from 04/14/23 showed EF 65-70%, no regional wall motion abnormalities, mild LVH, grade I DD, normal RV function, mildly elevated PA systolic pressure.    Patient also has a history of peripheral artery disease. In 05/2013, she underwent diamondback orbital rotational arthrectomy PTA for her distal right SFA, and DES to the peroneal artery. IN 07/2013, patient was referred to Dr. Welton Hall in [place] who performed intervention on her left popliteal and tibioperoneal trunk on 10/01/13. Followup dopplers in 04/2014 were excellent with ABIs of greater than 1 bilaterally, patent peroneal stent on the right, patent left popliteal. Her most recent dopplers from showed a right ABI of 0.39 and an occluded right popliteal artery, left ABI 1.12. She was without claudication, managed medically   Patient was last seen by Dr. Katheryne Pane on 01/17/24. At that time, she was without claudication. Her BP was low in the office and he hydralazine  was stopped. Remained on carvedilol , statin therapy, plavix .   I saw patient in clinic  02/28/24. At that time, patient reported overall just "not feeling well." Had some dizziness and bilateral foot numbness. Her imdur  was decreased to see if dizziness improved with higher BP. Se was also instructed to increase water  intake.    Admitted 3/29-4/2 with chest pain. hsTn negative. Her BP was very labile. 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 There was also 50-75% stenosis at the origin of the IMA. Vascular surgery was consulted and recommended medical management. 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 Treatment of the celiac dissection was complicated as patient developed dizziness with BP drop. Discharged on  carvedilol  3.125 mg BID, plavix  75 mg daily, zetia  10 mg daily, hydralazine  25 mg every 8 hours, imdur  60 mg daily, crestor  20 mg daily.   Today, patient presents for a hospital follow up appointment. She is accompanied by her daughter. Reports that her BP has continued to be labile, with systolic readings as high as 223. Estimates that her lowest SBP was in the 150s. She continues to have some dizziness despite these high BP. Was previously seen by PT for evaluation of her dizziness, PT suspected that her difficulty was more so related to balance issues.  Patient has diabetes with most recent A1c 8.8. Daughter asked if some of her balance issues could be related to her diabetes. No recurrent chest pain since her recent admission.  Studies Reviewed: .    Cardiac Studies & Procedures   ______________________________________________________________________________________________ CARDIAC CATHETERIZATION  CARDIAC CATHETERIZATION 11/01/2016  Conclusion Images from the original result were not included.   LV end diastolic pressure is mildly elevated. In the  setting of systemic hypertension  SVG-diagonal is moderate in size. Origin lesion, 55 %stenosed.  LM lesion, 55 %stenosed.  Ost LAD lesion, 100 %stenosed.  Prox LAD to Mid LAD lesion, 100 %stenosed. After D1.  Very small caliber Ost Ramus lesion, 70 %stenosed.  Ost Cx to Prox Cx lesion, 80 %stenosed. Prox Cx lesion, 70 %stenosed.  SVG-OM is large and anatomically normal. The flow in the graft is reversed. The bifurcating Cx OM is relatively normal.  Very tortuous L Subclavian artery - unable to perform L Radial Cath.   Patient has relatively stable coronary disease with maybe mild progression of the vein graft to the diagonal branch ostial disease that was maybe 40% previously. This does not appear to be enough flow limiting to cause resting symptoms, however most likely etiology is accelerated hypertension, accelerated LVEDP and microvascular disease with a small ramus branch and other branches from the native system.  PLAN:  Admit to step down for ongoing blood pressure control. Will use IV nitroglycerin  overnight. I will increase carvedilol  to 6.25 twice a day and add amlodipine .  Continue aspirin , Plavix  and statin  Can stop IV heparin    Continue medical management otherwise. Will restart Imdur  once her nitroglycerin  is weaned off.    Randene Bustard, M.D., M.S. Interventional Cardiologist  Pager # [phone] Phone # [phone] [address] [place], [place] [address]  Findings Coronary Findings Diagnostic  Dominance: Right  Left Main The lesion is located at the major branch and tubular.  Left Anterior Descending The lesion is chronically occluded. Just after a proximal septal perforator The lesion is chronically occluded.  First Septal Branch Vessel is small in size.  Second Diagonal Branch Vessel is small in size.  Second Septal Branch Vessel is small in size.  Third Diagonal Branch Vessel is small in size.  Third Septal  Branch Vessel is small in size.  Ramus Intermedius Vessel is small. The lesion is smooth. Very small caliber ramus  Left Circumflex There is mild  diffuse disease throughout the vessel. The lesion is irregular. The lesion is calcified. Chronic The lesion is irregular. The lesion is calcified. Chronic  Second Obtuse Marginal Branch Vessel is small in size.  Right Coronary Artery Vessel is large. Vessel is angiographically normal.  Acute Marginal Branch Vessel is small in size.  Right Posterior Descending Artery Vessel is moderate in size.  Inferior Septal Vessel is small in size.  Right Posterior Atrioventricular Artery Vessel is moderate in size. Vessel is angiographically normal.  First Right Posterolateral Branch Vessel is small in size. Vessel is angiographically normal.  Second Right Posterolateral Branch Vessel is moderate in size. Vessel is angiographically normal.  LIMA LIMA Graft To Dist LAD LIMA and is normal in caliber and anatomically normal.  Saphenous Graft To 1st Diag SVG and is moderate in size. The lesion is tubular and smooth.  Saphenous Graft To Lat 1st Mrg SVG and is large and anatomically normal. The flow in the graft is reversed.  Intervention  No interventions have been documented.   STRESS TESTS  MYOCARDIAL PERFUSION IMAGING 02/08/2022  Narrative   Normal blood pressure and normal heart rate response noted during stress. Heart rate recovery was normal.   No ST deviation was noted. There were no  arrhythmias during recovery.   LV perfusion is normal. There is no evidence of ischemia. There is no evidence of infarction.   Left ventricular function is abnormal. Nuclear stress EF: 61 %. The left ventricular ejection fraction is normal (55-65%). End diastolic cavity size is normal. End systolic cavity size is normal. Evidence of transient ischemic dilation (TID) noted. TID was appreciated quantitatively but not visually.   The study is  normal. Findings are consistent with no prior ischemia. The study is low risk.   ECHOCARDIOGRAM  ECHOCARDIOGRAM COMPLETE 03/11/2024  Narrative ECHOCARDIOGRAM REPORT    Patient Name:   [name] Date of Exam: 03/11/2024 Medical Rec #:  [id number]    Height:       63.0 in Accession #:    [id number]   Weight:       144.6 lb Date of Birth:  [birth year]   BSA:          1.685 m Patient Age:    89 years     BP:           133/81 mmHg Patient Gender: F            HR:           60 bpm. Exam Location:  Inpatient  Procedure: 2D Echo, Cardiac Doppler and Color Doppler (Both Spectral and Color Flow Doppler were utilized during procedure).  Indications:    Chest Pain R07.9  History:        Patient has prior history of Echocardiogram examinations, most recent 04/14/2023. CHF, Previous Myocardial Infarction, Prior CABG; Risk Factors:Diabetes.  Sonographer:    Astrid Blamer Referring Phys: [id number] CALLIE E [place]RICH  IMPRESSIONS   1. Left ventricular ejection fraction, by estimation, is 65 to 70%. The left ventricle has normal function. The left ventricle has no regional wall motion abnormalities. Left ventricular diastolic parameters are consistent with Grade I diastolic dysfunction (impaired relaxation). 2. Right ventricular systolic function is normal. The right ventricular size is normal. 3. Left atrial size was moderately dilated. 4. The mitral valve is normal in structure. No evidence of mitral valve regurgitation. No evidence of mitral stenosis. 5. The aortic valve is tricuspid. Aortic valve regurgitation is not visualized. No aortic stenosis is present. 6. The inferior vena cava is normal in size with greater than 50% respiratory variability, suggesting right atrial pressure of 3 mmHg.  Comparison(s): No significant change from prior study. Prior images reviewed side by side.  FINDINGS Left Ventricle: Left ventricular ejection fraction, by estimation, is 65 to 70%. The left ventricle  has normal function. The left ventricle has no regional wall motion abnormalities. The left ventricular internal cavity size was normal in size. There is no left ventricular hypertrophy. Abnormal (paradoxical) septal motion consistent with post-operative status. Left ventricular diastolic parameters are consistent with Grade I diastolic dysfunction (impaired relaxation). Normal left ventricular filling pressure.  Right Ventricle: The right ventricular size is normal. No increase in right ventricular wall thickness. Right ventricular systolic function is normal.  Left Atrium: Left atrial size was moderately dilated.  Right Atrium: Right atrial size was normal in size.  Pericardium: There is no evidence of pericardial effusion.  Mitral Valve: The mitral valve is normal in structure. Mild to moderate mitral annular calcification. No evidence of mitral valve regurgitation. No evidence of mitral valve stenosis.  Tricuspid Valve: The tricuspid valve is normal in structure. Tricuspid valve regurgitation is mild.  Aortic Valve: The aortic valve is tricuspid. Aortic valve regurgitation is not visualized.  No aortic stenosis is present. Aortic valve mean gradient measures 5.0 mmHg. Aortic valve peak gradient measures 6.7 mmHg. Aortic valve area, by VTI measures 2.12 cm.  Pulmonic Valve: The pulmonic valve was normal in structure. Pulmonic valve regurgitation is not visualized. No evidence of pulmonic stenosis.  Aorta: The aortic root is normal in size and structure.  Venous: The inferior vena cava is normal in size with greater than 50% respiratory variability, suggesting right atrial pressure of 3 mmHg.  IAS/Shunts: No atrial level shunt detected by color flow Doppler.   LEFT VENTRICLE PLAX 2D LVIDd:         4.20 cm   Diastology LVIDs:         3.00 cm   LV e' medial:    6.20 cm/s LV PW:         0.90 cm   LV E/e' medial:  10.0 LV IVS:        1.10 cm   LV e' lateral:   6.96 cm/s LVOT diam:      1.95 cm   LV E/e' lateral: 8.9 LV SV:         59 LV SV Index:   35 LVOT Area:     2.99 cm   LEFT ATRIUM             Index        RIGHT ATRIUM          Index LA Vol (A2C):   32.8 ml 19.47 ml/m  RA Area:     9.74 cm LA Vol (A4C):   45.6 ml 27.07 ml/m  RA Volume:   18.30 ml 10.86 ml/m LA Biplane Vol: 40.3 ml 23.92 ml/m AORTIC VALVE AV Area (Vmax):    2.55 cm AV Area (Vmean):   2.12 cm AV Area (VTI):     2.12 cm AV Vmax:           129.00 cm/s AV Vmean:          102.000 cm/s AV VTI:            0.279 m AV Peak Grad:      6.7 mmHg AV Mean Grad:      5.0 mmHg LVOT Vmax:         110.00 cm/s LVOT Vmean:        72.300 cm/s LVOT VTI:          0.198 m LVOT/AV VTI ratio: 0.71  AORTA Ao Root diam: 3.10 cm  MITRAL VALVE                TRICUSPID VALVE MV Area (PHT): 1.86 cm     TR Peak grad:   20.6 mmHg MV Decel Time: 408 msec     TR Vmax:        227.00 cm/s MV E velocity: 61.80 cm/s MV A velocity: 100.00 cm/s  SHUNTS MV E/A ratio:  0.62         Systemic VTI:  0.20 m Systemic Diam: 1.95 cm  Mihai Croitoru MD Electronically signed by Monica Rumple MD Signature Date/Time: 03/11/2024/1:56:27 PM    Final          ______________________________________________________________________________________________       Risk Assessment/Calculations:         Physical Exam:   VS:  BP (!) 154/78 (BP Location: Right Arm, Patient Position: Sitting, Cuff Size: Normal)   Pulse 87   Ht 5\' 3"  (1.6 m)   Wt 146 lb (66.2 kg)  SpO2 96%   BMI 25.86 kg/m    Wt Readings from Last 3 Encounters:  04/20/24 146 lb (66.2 kg)  04/18/24 145 lb (65.8 kg)  04/11/24 145 lb (65.8 kg)    GEN: Well nourished, well developed in no acute distress. Sitting comfortably in the chair  NECK: No JVD  CARDIAC:  RRR, no murmurs, rubs, gallops. Radial pulses 2+ bilaterally  RESPIRATORY:  Clear to auscultation without rales, wheezing or rhonchi. Normal WOB on room air  ABDOMEN: Soft, non-tender,  non-distended EXTREMITIES:  No edema in BLE; No deformity   ASSESSMENT AND PLAN: .    HTN  Hypotension  Dizziness  - When seen by Dr. Katheryne Pane in 01/2024, her BP was low at 80/52. Hydralazine  was held. I saw her in clinic on 3/18, again BP was low at 90/68 and imdur  was reduced.  - Patient then admitted 3/29-4/2 after she had chest pain and a syncopal episode. BP was 210/110s. During that admission, she was also found to have a celiac artery dissection. Reportedly continued to have labile BP that admission  - She was discharged on carvedilol  3.125 mg BID, hydralazine  25 mg q8hrs, imdur  60 mg daily  - She has continued to have high BP at home, sometimes up to the 200s systolic. Estimates that the lowest her BP has been was in the 150s systolic  - Patient has ongoing and somewhat chronic issues with dizziness. She was evaluated by PT, they suspected that her difficulty was related to balance issues rather than dizziness. Doesn't seem that her dizziness has improved with high BP  - Tricky situation as patient has had both hypo and hypertension recently. BP is labile  - Instructed patient to check her BP prior to taking her hydralazine . If her systolic BP is >150, instructed her to take hydralazine  50 mg for that dose. If her systolic BP is less than 150, she should take 25 mg daily. Discussed this with patient and her daughter, both of whom are confident that she will be able to follow this plan  - Continue carvedilol  3.125 mg BID, imdur  60 mg daily  - Instructed patient to stay hydrated to help relieve dizziness   CAD  - Patient previously had CABG in 1998. Most recent ischemic evaluation was a nuclear stress test in 01/2022 that was a normal, low risk study. No evidence of ischemia or infarction - Most recent echocardiogram from 02/2024 showed EF 65-70%, no regional wall motion abnormalities, grade I DD, normal RV systolic function  - When admitted 4/29-3/2, she reported having an episode of near  syncope and chest pain before going to the ED.  - Near syncope suspected to have been caused by patient taking SL nitroglycerin  and having drop in BP  - Denies recurrent chest pain  - Cardiac PET was ordered at DC for evaluation of chest pain. This is scheduled for 6/4 - Continue crestor  20 mg daily, zetia  10 mg daily, plavix  75 mg daily   PAD Leg pain - Previously had diamondback orbital rotational atherectomy, PTA of the right SFA and stenting of her peroneal in 05/2013. Later in 09/2013, underwent intervention on her left popliteal and tibioperoneal trunk  - Dopplers in 03/2020 showed right ABI of 0.39, occluded right popliteal artery, left ABI of 1.12 - Repeat ABIs, lower extremity ultrasounds have been ordered and are pending  - Encouraged her to increase her physical activity, specifically by walking or using foot pedals to increase lower extremity blood flow  - Continue  plavix  75 mg daily, crestor  20 mg daily, zetia  10 mg daily    HLD  - Lipid panel from 09/2023 showed total cholesterol 220, LDL 154 and HDL 57  - Continue crestor  20 mg daily, zetia  10 mg daily   Celiac Artery Dissection  - Noted incidentally on CT imaging during her recent admission  - Seen by vascular surgery who recommended medical management  - Has outpatient follow up with vascular surgery next week   Dispo: Follow up in 6-8 weeks with APP   Signed, Debria Fang, PA-C

## 2024-04-11 ENCOUNTER — Other Ambulatory Visit: Payer: Self-pay

## 2024-04-11 NOTE — Patient Instructions (Signed)
 Visit Information  Thank you for taking time to visit with me today. Please don't hesitate to contact me if I can be of assistance to you before our next scheduled telephone appointment.  Following is a copy of your care plan:   Goals Addressed             This Visit's Progress    COMPLETED: VBCI TOC Care Plan   On track    Current Barriers: (reviewed 04/11/24) Chronic Disease Management support and education needs related to HTN    RNCM Clinical Goal(s):  (reviewed 04/11/24) Goal Met Patient will work with the Care Management team over the next 30 days to address Transition of Care Barriers: Medication access Medication Management Diet/Nutrition/Food Resources Support at home Provider appointments Functional/Safety verbalize understanding of plan for management of HTN as evidenced by No hospital admissions in the next 30 days  through collaboration with RN Care manager, provider, and care team.   Interventions:   (reviewed 04/11/24) Evaluation of current treatment plan related to  self management and patient's adherence to plan as established by provider   Hypertension Interventions:  (Status:  Goal on track:  Yes.) Short Term Goal Last practice recorded BP readings:  BP Readings from Last 3 Encounters:  03/14/24 120/62  02/28/24 90/68  01/17/24 (!) 80/52   Most recent eGFR/CrCl:  Lab Results  Component Value Date   EGFR 60 03/02/2024      Evaluation of current treatment plan related to hypertension self management and patient's adherence to plan as established by provider Reviewed medications with patient and discussed importance of compliance Discussed plans with patient for ongoing care management follow up and provided patient with direct contact information for care management team Reviewed scheduled/upcoming provider appointments including:  Advised patient to discuss blood pressure readings with provider  Patient Goals/Self-Care Activities:   (reviewed 04/11/24)  Goal Met Participate in Transition of Care Program/Attend [facility] scheduled calls Notify RN Care Manager of TOC call rescheduling needs Take all medications as prescribed Attend all scheduled provider appointments Call pharmacy for medication refills 3-7 days in advance of running out of medications Perform all self care activities independently   Follow Up Plan:  Telephone follow up appointment with care management team member scheduled for:  The patient request no further follow up visits          The patient has been provided with contact information for the care management team and has been advised to call with any health related questions or concerns.   Please call the care guide team at [phone] if you need to cancel or reschedule your appointment.   Please call the Suicide and Crisis Lifeline: 988 call the [place]  National Suicide Prevention Lifeline: 413-802-6501 or TTY: 916-555-4775 TTY (567) 253-7894) to talk to a trained counselor if you are experiencing a Mental Health or Behavioral Health Crisis or need someone to talk to.  Gareld June, BSN, RN [place]  VBCI - [employer] [facility] RN Care Manager [phone]

## 2024-04-11 NOTE — Transitions of Care (Post Inpatient/ED Visit) (Signed)
 Transition of Care week 4  Visit Note  04/11/2024  Name: [name] MRN: [id number]          DOB: Apr 30, 1933  Situation: Patient enrolled in [facility] 30-day program. Visit completed with Dearl Fabry by telephone.   Background:     Past Medical History:  Diagnosis Date   Allergy to IVP dye    Angina, class I (HCC) 04/22/2015   Atherosclerotic heart disease native coronary artery w/angina pectoris (HCC) 1992   a. 1992 s/p PTCA of LAD;  b. 1998 CABG x 3; c. 07/2001 Cath: Sev LM/LAD/LCX dzs, 3/3 patent grafts; d. 11/2013 Cath: stable graft anatomy; e. 10/2016 Cath: native 3VD, VG->OM nl, LIMA->LAD nl, VG->Diag 55.   Atherosclerotic heart disease of native coronary artery with angina pectoris (HCC) 01/14/2010   Qualifier: Diagnosis of  By: Arvie Latus MD, Moishe Angel    Atrial septal aneurysm    Barrett's esophagus 03/04/2010   Qualifier: Diagnosis of  By: Shelva Dice, Amy S    Bilateral leg edema 04/22/2015   Bradycardia, mild briefly to the 40s, asymptomatic 05/16/2013   Carotid arterial disease (HCC)    a. 05/2014 Carotid U/S: No signif bilat ICA stenosis, >60% L ECA.   Chronic anemia    Chronic diastolic CHF (congestive heart failure) (HCC)    a. 10/2016 Echo: Ef 55-60%, no rwma, Gr1 DD, triv AI, PASP , atrial septal aneurysm.   Chronic diastolic CHF (congestive heart failure), NYHA class 2 (HCC) 08/11/2017   Claudication (HCC) 05/16/2013   Peripheral arterial occlusive disease with lifestyle limiting claudication    Degenerative joint disease 05/30/2018   DIVERTICULOSIS OF COLON 01/14/2010   Qualifier: Diagnosis of  By: Arvie Latus MD, Moishe Angel    DM (diabetes mellitus), type 2 with peripheral vascular complications (HCC)    On Oral medications.   DOE (dyspnea on exertion) 04/22/2015   Essential hypertension    FECAL INCONTINENCE 03/04/2010   Qualifier: Diagnosis of  By: Francisca Irvine PA-c, Amy S    GERD (gastroesophageal reflux disease)    Hyperlipidemia with target LDL less than 70     Inflammatory arthritis 10/13/2017   Neuropathy    Non-insulin  dependent type 2 diabetes mellitus (HCC) 10/24/2015   NSTEMI (non-ST elevated myocardial infarction) (HCC) 11/01/2016   PAD (peripheral artery disease) (HCC) 05/2013; 09/2013   a. severe calcified POP-1 & TP Trunk Dz bilat;  b . 05/2013 [place] Rot Athrectomy (R POP) --> PTA R Pop, DES to R Peroneal;  c. 09/2013 PTA/Stenting of L Pop [name] - retrograde access);  d. 04/2014 ABI's: R/L: 1.1/1.1.   Preoperative cardiovascular examination 04/15/2020   PUD (peptic ulcer disease)    Rheumatoid arthritis (HCC)    S/P CABG x 3 1998   LIMA-LAD, SVG-OM, SVG-D1   Severe claudication (HCC) 05/2013   Referred for Peripheral Angio (Dr. Katheryne Pane --> Dr. Delana Favors in [place])   [facility] ALLERGY 01/14/2010   Qualifier: Diagnosis of  By: Misty Amour CMA (AAMA), [name]     Status post total replacement of right hip 10/12/2018   Vertigo 01/02/2014    Assessment: TOC Outreach completed to the patient today. She states she is having increased leg and joint pain. She is having difficulty walking from room to room in her house. She has taken her Tylenol  and Gabapentin  without much relief. She is trying to rest. Recommended she contact her provider to inform of needing something in addition to Tylenol  for pain relief. She states the decrease in mobility is impeding her ability  to do things in her life. She decided that she did not want further Outreach calls and hung up the phone.   Patient Reported Symptoms: Cognitive Cognitive Status: Alert and oriented to person, place, and time      Neurological Neurological Review of Symptoms: Numbness, Weakness Neurological Management Strategies: Medical device, Adequate rest Neurological Self-Management Outcome: 3 (uncertain) Neurological Comment: She is still having leg cramps and joint pain  HEENT HEENT Symptoms Reported: No symptoms reported      Cardiovascular Cardiovascular Symptoms Reported:  Lightheadness, Dizziness Other Cardiovascular Symptoms: weakness Does patient have uncontrolled Hypertension?: Yes Is patient checking Blood Pressure at home?: Yes Patient's Recent BP reading at home: Not reported today Cardiovascular Conditions: Hypertension, High blood cholesterol Cardiovascular Management Strategies: Medication therapy Weight: 145 lb (65.8 kg) Cardiovascular Self-Management Outcome: 3 (uncertain)  Respiratory Respiratory Symptoms Reported: No symptoms reported    Endocrine Patient reports the following symptoms related to hypoglycemia or hyperglycemia : No symptoms reported Is patient diabetic?: Yes Is patient checking blood sugars at home?: No Endocrine Conditions: Diabetes Endocrine Management Strategies: Medication therapy Endocrine Self-Management Outcome: 3 (uncertain) Endocrine Comment: The patient doesn't test  Gastrointestinal Gastrointestinal Symptoms Reported: No symptoms reported   Nutrition Risk Screen (CP): No indicators present  Genitourinary Genitourinary Symptoms Reported: No symptoms reported    Integumentary Integumentary Symptoms Reported: No symptoms reported    Musculoskeletal Musculoskelatal Symptoms Reviewed: Difficulty walking, Muscle pain Additional Musculoskeletal Details: States she has leg pain Musculoskeletal Conditions: Joint pain, Mobility limited, Rheumatoid arthritis Musculoskeletal Management Strategies: Medical device, Medication therapy, Adequate rest Musculoskeletal Self-Management Outcome: 3 (uncertain) Musculoskeletal Comment: She is going to call the RA provider to discuss pain medication Falls in the past year?: Yes Number of falls in past year: 1 or less Was there an injury with Fall?: No Fall Risk Category Calculator: 1 Patient Fall Risk Level: Low Fall Risk Patient at Risk for Falls Due to: Impaired mobility, Medication side effect, Impaired balance/gait Fall risk Follow up: Falls prevention discussed  Psychosocial  Psychosocial Symptoms Reported: No symptoms reported         There were no vitals filed for this visit.  Medications Reviewed Today     Reviewed by Claudene Crystal, RN (Case Manager) on 04/11/24 at 1328  Med List Status: <None>   Medication Order Taking? Sig Documenting Provider Last Dose Status Informant  acetaminophen  (TYLENOL ) 650 MG CR tablet [id number] No Take 650 mg by mouth daily as needed for pain. [provider] 03/10/2024 Noon Active Self, Pharmacy Records  carvedilol  (COREG ) 3.125 MG tablet [id number]  Take 1 tablet (3.125 mg total) by mouth 2 (two) times daily with a meal. Danford, Willis Harter, MD  Active   Cholecalciferol  (VITAMIN D3) 10 MCG (400 UNIT) tablet [id number] No Take 400 Units by mouth daily. [provider] Past Week Active Self, Pharmacy Records           Med Note [facility], Vernona Goods May 04, 2023  9:40 PM) Taken occasionally  clopidogrel  (PLAVIX ) 75 MG tablet [id number] No Take 75 mg by mouth at bedtime. [provider] 03/08/2024 Active Self, Pharmacy Records           Med Note Desiree Florence, [facility]   Tue Oct 19, 2022 11:10 PM)    denosumab (PROLIA) 60 MG/ML SOSY injection [id number] No Inject 60 mg into the skin every 6 (six) months. [provider] Taking Active Self, Pharmacy Records           Med Note (  [facility], Nieves Bars   Sun Mar 11, 2024 12:13 AM)    ezetimibe  (ZETIA ) 10 MG tablet [id number] No Take 10 mg by mouth at bedtime. [provider] 03/09/2024 Active Self, Pharmacy Records           Med Note [facility], Anthony Kirk Mar 11, 2024 12:13 AM)    folic acid  (FOLVITE ) 1 MG tablet [id number] No Take 1 mg by mouth daily. [provider] 03/10/2024 Morning Active Self, Pharmacy Records  gabapentin  (NEURONTIN ) 400 MG capsule [id number] No Take 400 mg by mouth 2 (two) times daily. [provider] 03/10/2024 Morning Active Self, Pharmacy Records  glimepiride  (AMARYL ) 2 MG tablet [id number] No Take 2 mg by mouth  daily with breakfast. [provider] 03/10/2024 Morning Active Self, Pharmacy Records  hydrALAZINE  (APRESOLINE ) 25 MG tablet [id number]  Take 1 tablet (25 mg total) by mouth every 8 (eight) hours. Ephriam Hashimoto, MD  Active   isosorbide  mononitrate (IMDUR ) 60 MG 24 hr tablet [id number]  Take 1 tablet (60 mg total) by mouth daily. Ephriam Hashimoto, MD  Active   LORazepam  (ATIVAN ) 0.5 MG tablet [id number] No 1 tablet Orally Once a day as needed for anxiety for 90 days [provider] Unknown Active Self, Pharmacy Records  methotrexate  [facility]) 2.5 MG tablet [id number] No Take 20 mg by mouth every Tuesday. Take 8 tablets by mouth every Friday  Caution:Chemotherapy. Protect from light. [provider] 03/06/2024 Active            Med Note ([facility], Anthony Kirk Mar 11, 2024 12:41 AM) Patient said that this was restarted after Sulfasalazine  was discontinued.  Multiple Vitamin (MULTIVITAMIN) capsule [id number] No Take 1 capsule by mouth daily. [provider] Past Week Active Self, Pharmacy Records  nitroGLYCERIN  (NITROSTAT ) 0.4 MG SL tablet [id number] No PLACE 1 TABLET UNDER THE TONGUE EVERY 5 MINUTES AS NEEDED FOR CHEST PAIN. Avanell Leigh, MD 03/10/2024 Evening Active Self, Pharmacy Records  ondansetron  (ZOFRAN -ODT) 8 MG disintegrating tablet [id number] No Take 8 mg by mouth every 8 (eight) hours as needed for nausea. [provider] Past Week Active Self, Pharmacy Records  predniSONE  (DELTASONE ) 1 MG tablet [id number] No Take 4 mg by mouth daily.  Patient not taking: Reported on 04/04/2024   [provider] Not Taking Active Self, Pharmacy Records  rosuvastatin  (CRESTOR ) 20 MG tablet [id number] No Take 20 mg by mouth daily. [provider] 03/09/2024 Active Self, Pharmacy Records            Recommendation:   Contact the provider for additional pain medication recommendations for RA Adequate rest and take Tylenol  for pain  relief  Follow Up Plan:   Closing From:  Transitions of Care Program. The patient declines further Outreach  Gareld June, BSN, RN [place]  [facility] RN Care Manager [phone]

## 2024-04-18 ENCOUNTER — Ambulatory Visit: Admitting: Physician Assistant

## 2024-04-18 ENCOUNTER — Encounter: Payer: Self-pay | Admitting: Physician Assistant

## 2024-04-18 ENCOUNTER — Telehealth: Payer: Self-pay | Admitting: Physician Assistant

## 2024-04-18 ENCOUNTER — Other Ambulatory Visit: Payer: Self-pay

## 2024-04-18 VITALS — BP 102/64 | HR 67 | Ht 63.0 in | Wt 145.0 lb

## 2024-04-18 DIAGNOSIS — K5900 Constipation, unspecified: Secondary | ICD-10-CM

## 2024-04-18 DIAGNOSIS — R110 Nausea: Secondary | ICD-10-CM

## 2024-04-18 DIAGNOSIS — K602 Anal fissure, unspecified: Secondary | ICD-10-CM

## 2024-04-18 DIAGNOSIS — R1031 Right lower quadrant pain: Secondary | ICD-10-CM

## 2024-04-18 DIAGNOSIS — I774 Celiac artery compression syndrome: Secondary | ICD-10-CM

## 2024-04-18 MED ORDER — AMBULATORY NON FORMULARY MEDICATION: 1 refills | Status: AC

## 2024-04-18 MED ORDER — ONDANSETRON 8 MG PO TBDP: 8.0000 mg | ORAL_TABLET | Freq: Three times a day (TID) | ORAL | 1 refills | Status: AC | PRN

## 2024-04-18 NOTE — Telephone Encounter (Signed)
 Spoke with PT's daughter. Prescription faxed 12:01pm, resent again at 130pm. Reminded that this is a compound medication and takes up to 24 hours to mix properly

## 2024-04-18 NOTE — Patient Instructions (Addendum)
 We have sent the following medications to your pharmacy for you to pick up at your convenience: Zofran .   Start Miralax  1 capful daily in 8 ounces of liquid.  May apply Reticare OTC as needed.  We have sent a prescription for Diltiazem 2% gel to [facility] for you. Using your index finger, you should apply a small amount of medication inside the rectum up to your first knuckle/joint three times daily x 6-8 weeks.  [facility]'s information is below: Address: [address] [place], [place] [address]  [phone]  *Please DO NOT go directly from our office to pick up this medication! Give the pharmacy 1 day to process the prescription as this is compounded and takes time to make.  _______________________________________________________  If your blood pressure at your visit was 140/90 or greater, please contact your primary care physician to follow up on this.  _______________________________________________________  If you are age 71 or older, your body mass index should be between 23-30. Your Body mass index is 25.69 kg/m. If this is out of the aforementioned range listed, please consider follow up with your Primary Care Provider.  If you are age 28 or younger, your body mass index should be between 19-25. Your Body mass index is 25.69 kg/m. If this is out of the aformentioned range listed, please consider follow up with your Primary Care Provider.   ________________________________________________________  The [place] [facility] providers would like to encourage you to use MYCHART to communicate with providers for non-urgent requests or questions.  Due to long hold times on the telephone, sending your provider a message by [facility] may be a faster and more efficient way to get a response.  Please allow 48 business hours for a response.  Please remember that this is for non-urgent requests.  _______________________________________________________

## 2024-04-18 NOTE — Progress Notes (Signed)
 Chief Complaint: Nausea, constipation and rectal pain  HPI:    [name] is an 89 year old female with a past medical history as listed below, known to Dr. Dominic Friendly, who was referred to me by Pete Brand, DO for a complaint of nausea, constipation and rectal pain.      11/11 colonoscopy with diverticulosis.  Repeat recommended in 10 years.    11/10/17 patient complained of rectal bleeding and pain.  Found to have fissure and treated with nitroglycerin .  Also discussed suprapubic abdominal pain which was short lasting and severe but had resolved.  CT negative.    03/11/2024 patient had CT angio of the chest abdomen and pelvis which showed focal dissection of the celiac axis at its origin with resultant hemodynamic significant stenosis of the origin 60 to 70%.  Also 50 to 75% stenosis at the origin of the IMA.  Discussed possible chronic mesenteric ischemia.    03/14/24 patient discharged from the hospital and sent to vascular surgery in 6 weeks.    Today, patient presents to clinic accompanied by family member.  She discusses that she thinks that her constipation is better now, but still describes hard balls at times and also had to manually disimpact herself yesterday.  Since then she has had a fair amount of rectal discomfort and pain and feels like she may have torn something back there.  Also saw some blood during disimpaction.    Also discusses ongoing nausea, this is typically worse after eating at times or before after a bowel movement.  She has yet to see the vascular surgeons in regards to some stenosis.    Denies fever, chills or weight loss.  Past Medical History:  Diagnosis Date   Allergy to IVP dye    Angina, class I (HCC) 04/22/2015   Atherosclerotic heart disease native coronary artery w/angina pectoris (HCC) 1992   a. 1992 s/p PTCA of LAD;  b. 1998 CABG x 3; c. 07/2001 Cath: Sev LM/LAD/LCX dzs, 3/3 patent grafts; d. 11/2013 Cath: stable graft anatomy; e. 10/2016 Cath: native 3VD,  VG->OM nl, LIMA->LAD nl, VG->Diag 55.   Atherosclerotic heart disease of native coronary artery with angina pectoris (HCC) 01/14/2010   Qualifier: Diagnosis of  By: Arvie Latus MD, Moishe Angel    Atrial septal aneurysm    Barrett's esophagus 03/04/2010   Qualifier: Diagnosis of  By: Shelva Dice, Amy S    Bilateral leg edema 04/22/2015   Bradycardia, mild briefly to the 40s, asymptomatic 05/16/2013   Carotid arterial disease (HCC)    a. 05/2014 Carotid U/S: No signif bilat ICA stenosis, >60% L ECA.   Chronic anemia    Chronic diastolic CHF (congestive heart failure) (HCC)    a. 10/2016 Echo: Ef 55-60%, no rwma, Gr1 DD, triv AI, PASP , atrial septal aneurysm.   Chronic diastolic CHF (congestive heart failure), NYHA class 2 (HCC) 08/11/2017   Claudication (HCC) 05/16/2013   Peripheral arterial occlusive disease with lifestyle limiting claudication    Degenerative joint disease 05/30/2018   DIVERTICULOSIS OF COLON 01/14/2010   Qualifier: Diagnosis of  By: Arvie Latus MD, Moishe Angel    DM (diabetes mellitus), type 2 with peripheral vascular complications (HCC)    On Oral medications.   DOE (dyspnea on exertion) 04/22/2015   Essential hypertension    FECAL INCONTINENCE 03/04/2010   Qualifier: Diagnosis of  By: Francisca Irvine PA-c, Amy S    GERD (gastroesophageal reflux disease)    Hyperlipidemia with target LDL less than 70    Inflammatory  arthritis 10/13/2017   Neuropathy    Non-insulin  dependent type 2 diabetes mellitus (HCC) 10/24/2015   NSTEMI (non-ST elevated myocardial infarction) (HCC) 11/01/2016   PAD (peripheral artery disease) (HCC) 05/2013; 09/2013   a. severe calcified POP-1 & TP Trunk Dz bilat;  b . 05/2013 Diamondback Rot Athrectomy (R POP) --> PTA R Pop, DES to R Peroneal;  c. 09/2013 PTA/Stenting of L Pop [name] - retrograde access);  d. 04/2014 ABI's: R/L: 1.1/1.1.   Preoperative cardiovascular examination 04/15/2020   PUD (peptic ulcer disease)    Rheumatoid arthritis (HCC)    S/P  CABG x 3 1998   LIMA-LAD, SVG-OM, SVG-D1   Severe claudication (HCC) 05/2013   Referred for Peripheral Angio (Dr. Katheryne Pane --> Dr. Delana Favors in [place])   [facility] ALLERGY 01/14/2010   Qualifier: Diagnosis of  By: Misty Amour CMA (AAMA), [name]     Status post total replacement of right hip 10/12/2018   Vertigo 01/02/2014    Past Surgical History:  Procedure Laterality Date   ATHERECTOMY Right 05/15/2013   Procedure: ATHERECTOMY;  Surgeon: Avanell Leigh, MD;  Location: [facility] CATH LAB;  Service: Cardiovascular;  Laterality: Right;  popliteal   BACK SURGERY     CARDIAC CATHETERIZATION  07/17/01   2 v CAD with LM, circ, LAD, obtuse diag, mod stenosis of diag vein graft , mild stenosis of marg vein graft, nl EF, medical treatment   CARDIAC CATHETERIZATION  11/2013   Widely patent LIMA-LAD & SVG-OM with mild progression of proximal stenosis ~40-50% in SVG-D1; Widely patent native RCA with known severe native LCA disease   CARDIAC CATHETERIZATION N/A 11/01/2016   Procedure: Left Heart Cath and Cors/Grafts Angiography;  Surgeon: Arleen Lacer, MD;  Location: [facility] INVASIVE CV LAB;  Service: Cardiovascular: Hemodynamics: High LVEDP!.  Cors/Grafts: LM 55%, o-p LAD 100% then after D1 -> LIMA-LAD patent, SVG-Diag ~55%. small RI wiht ~70% ostial. O-p Cx 80% - pCx 70% --> SVG-bifurcating OM patent. -- med Rx   CORONARY ANGIOPLASTY  1992   PCI to LAD   CORONARY ARTERY BYPASS GRAFT  1998   LIMA-LAD, SVG-diagonal (none roughly 50% stenosis), SVG-OM   LEFT HEART CATHETERIZATION WITH CORONARY ANGIOGRAM N/A 11/27/2013   Procedure: LEFT HEART CATHETERIZATION WITH CORONARY ANGIOGRAM;  Surgeon: Arleen Lacer, MD;  Location: [facility] CATH LAB;  Service: Cardiovascular;  Laterality: N/A;   LOW EXTREMITY DOPPLERS/ABI  10/2016   Patent right SFA, popliteal and peroneal tendons. Patent left SFA and popliteal stent. 30-50% stenosis in the right common femoral and popliteal arteries, 50-74% stenosis in left profunda femoris  artery. --> 1 year follow-up.  RABI - 1.2, LABI 1.1   LOWER EXTREMITY ANGIOGRAM N/A 02/22/2013   Procedure: LOWER EXTREMITY ANGIOGRAM;  Surgeon: Avanell Leigh, MD;  Location: [facility] CATH LAB;  Service: Cardiovascular;  Laterality: N/A;   LOWER EXTREMITY ANGIOGRAM N/A 07/20/2013   Procedure: LOWER EXTREMITY ANGIOGRAM;  Surgeon: Avanell Leigh, MD;  Location: [facility] CATH LAB;  Service: Cardiovascular;  Laterality: N/A;   NM MYOVIEW  LTD  04/03/2013; 5/26'/2016   Lexiscan : a)  Apical perfusion defect with no ischemia. Consider prior infarct versus breast attenuation.;; b) 5/'16: LOW RISK, Nl EF ~55-65%, No Ischemia /Infarction   NM MYOVIEW  LTD  10/'19; 4/'21   a) LOW RISK.  EF 55%.  No ischemia or infarction. b) EF estimated 81%.  Suboptimal images.  May be subtle inferior ischemia-initially read by radiology as intermediate risk, however overread by cardiology to be mild low risk.[name]  PERCUTANEOUS STENT INTERVENTION Right 05/15/2013   Procedure: PERCUTANEOUS STENT INTERVENTION;  Surgeon: Avanell Leigh, MD;  Location: [facility] CATH LAB;  Service: Cardiovascular;  Laterality: Right;  tibioperoneal trunk   Peroneal Artery Stent  05/15/13   Diamondback rot. atherectomy of high grade segmental popliteal stenosis then Chocolate balloonPTA; then angiosculpt PTA of the prox peroneal with stent-Xpedition    pv angio  07/20/13   high-grade calcified popliteal disease with one-vessel runoff via a small anterior tibial artery that has proximal disease as well, no intervention   TOTAL HIP ARTHROPLASTY Right 10/12/2018   Procedure: RIGHT TOTAL HIP ARTHROPLASTY ANTERIOR APPROACH;  Surgeon: Arnie Lao, MD;  Location: [facility] ORS;  Service: Orthopedics;  Laterality: Right;   TOTAL HIP ARTHROPLASTY Left 11/21/2020   Procedure: LEFT TOTAL HIP ARTHROPLASTY ANTERIOR APPROACH;  Surgeon: Arnie Lao, MD;  Location: [facility] ORS;  Service: Orthopedics;  Laterality: Left;   TRANSTHORACIC ECHOCARDIOGRAM  11/'17; 10/19   a) EF  55-60%. Gr 1 DD. Normal PAP. Aortic Sclerosis.;; b) Normal LV size and function with vigorous EF 65 to 70%.  GR 1 DD.  Severe LA dilation (suggestive of probably worsening: GR 1 DD).   TRANSTHORACIC ECHOCARDIOGRAM  03/18/2020   EF 70-75%.  Hyperdynamic.  GR 1 DD.  No R WMA.  Mild LA dilation.  Mild aortic valve sclerosis but no stenosis.-No change from prior echo   TUBAL LIGATION      Current Outpatient Medications  Medication Sig Dispense Refill   acetaminophen  (TYLENOL ) 650 MG CR tablet Take 650 mg by mouth daily as needed for pain.     carvedilol  (COREG ) 3.125 MG tablet Take 1 tablet (3.125 mg total) by mouth 2 (two) times daily with a meal. 180 tablet 0   Cholecalciferol  (VITAMIN D3) 10 MCG (400 UNIT) tablet Take 400 Units by mouth daily.     clopidogrel  (PLAVIX ) 75 MG tablet Take 75 mg by mouth at bedtime.     denosumab (PROLIA) 60 MG/ML SOSY injection Inject 60 mg into the skin every 6 (six) months.     ezetimibe  (ZETIA ) 10 MG tablet Take 10 mg by mouth at bedtime.     folic acid  (FOLVITE ) 1 MG tablet Take 1 mg by mouth daily.  1   gabapentin  (NEURONTIN ) 400 MG capsule Take 400 mg by mouth 2 (two) times daily.     glimepiride  (AMARYL ) 2 MG tablet Take 2 mg by mouth daily with breakfast.     hydrALAZINE  (APRESOLINE ) 25 MG tablet Take 1 tablet (25 mg total) by mouth every 8 (eight) hours. 270 tablet 0   isosorbide  mononitrate (IMDUR ) 60 MG 24 hr tablet Take 1 tablet (60 mg total) by mouth daily. 90 tablet 0   LORazepam  (ATIVAN ) 0.5 MG tablet 1 tablet Orally Once a day as needed for anxiety for 90 days     methotrexate  (RHEUMATREX) 2.5 MG tablet Take 20 mg by mouth every Tuesday. Take 8 tablets by mouth every Friday  Caution:Chemotherapy. Protect from light.     Multiple Vitamin (MULTIVITAMIN) capsule Take 1 capsule by mouth daily.     nitroGLYCERIN  (NITROSTAT ) 0.4 MG SL tablet PLACE 1 TABLET UNDER THE TONGUE EVERY 5 MINUTES AS NEEDED FOR CHEST PAIN. 450 tablet 1   ondansetron  (ZOFRAN -ODT)  8 MG disintegrating tablet Take 8 mg by mouth every 8 (eight) hours as needed for nausea.     predniSONE  (DELTASONE ) 1 MG tablet Take 4 mg by mouth daily. (Patient not taking: Reported on 04/04/2024)  rosuvastatin  (CRESTOR ) 20 MG tablet Take 20 mg by mouth daily.     No current facility-administered medications for this visit.    Allergies as of 04/18/2024 - Review Complete 04/11/2024  Allergen Reaction Noted   Fish allergy Hives and Shortness Of Breath 11/26/2013   Iodinated contrast media Shortness Of Breath and Anaphylaxis 02/12/2013   Latex Anaphylaxis, Hives, Shortness Of Breath, Itching, and Other (See Comments) 01/14/2010   Shellfish allergy Anaphylaxis, Hives, Shortness Of Breath, and Other (See Comments) 02/12/2013   Amlodipine  Swelling 11/04/2021   Evolocumab Other (See Comments) 07/13/2021   Other Itching and Other (See Comments) 04/21/2015   Metformin Nausea And Vomiting 09/05/2013    Family History  Adopted: Yes  Problem Relation Age of Onset   Hypertension Mother    Hyperlipidemia Mother    Hyperlipidemia Father    Hypertension Father     Social History   Socioeconomic History   Marital status: Married    Spouse name: Not on file   Number of children: Not on file   Years of education: Not on file   Highest education level: Not on file  Occupational History   Occupation: retired  Tobacco Use   Smoking status: Never   Smokeless tobacco: Never  Vaping Use   Vaping status: Never Used  Substance and Sexual Activity   Alcohol  use: Not Currently   Drug use: No   Sexual activity: Not on file  Other Topics Concern   Not on file  Social History Narrative   Right handed   Lives alone ( husband in rehab facility)   Caffeine- mostly coffee       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   Social Drivers of Health   Financial Resource Strain: Not on file  Food Insecurity: No Food Insecurity (03/15/2024)   Hunger Vital Sign    Worried About Running Out of Food in the Last Year: Never  true    Ran Out of Food in the Last Year: Never true  Transportation Needs: No Transportation Needs (03/15/2024)   PRAPARE - Administrator, Civil Service (Medical): No    Lack of Transportation (Non-Medical): No  Physical Activity: Not on file  Stress: Not on file  Social Connections: Socially Integrated (03/11/2024)   Social Connection and Isolation Panel [NHANES]    Frequency of Communication with Friends and Family: More than three times a week    Frequency of Social Gatherings with Friends and Family: Twice a week    Attends Religious Services: More than 4 times per year    Active Member of [employer] or Organizations: No    Attends Engineer, structural: More than 4 times per year    Marital Status: Married  Catering manager Violence: Not At Risk (03/15/2024)   Humiliation, Afraid, Rape, and Kick questionnaire    Fear of Current or Ex-Partner: No    Emotionally Abused: No    Physically Abused: No    Sexually Abused: No    Review of Systems:    Constitutional: No weight loss, fever or chills Skin: No rash  Cardiovascular: No chest pain Respiratory: No SOB  Gastrointestinal: See HPI and otherwise negative Genitourinary: No dysuria  Neurological: No headache, dizziness or syncope Musculoskeletal: No new muscle or joint pain Hematologic: No bleeding or bruising Psychiatric: No history of depression or anxiety   Physical Exam:  Vital signs: BP 102/64   Pulse 67   Ht 5\' 3"  (1.6 m)   Wt 145 lb (65.8 kg)  BMI 25.69 kg/m    Constitutional:   Pleasant Elderly AA female appears to be in NAD, Well developed, Well nourished, alert and cooperative Head:  Normocephalic and atraumatic. Eyes:   PEERL, EOMI. No icterus. Conjunctiva pink. Ears:  Normal auditory acuity. Neck:  Supple Throat: Oral cavity and pharynx without inflammation, swelling or lesion.  Respiratory: Respirations even and unlabored. Lungs clear to auscultation bilaterally.   No wheezes, crackles, or  rhonchi.  Cardiovascular: Normal S1, S2. No MRG. Regular rate and rhythm. No peripheral edema, cyanosis or pallor.  Gastrointestinal:  Soft, nondistended, nontender. No rebound or guarding. Normal bowel sounds. No appreciable masses or hepatomegaly. Rectal: External: Large posterior fissure tender to palpation; internal deferred due to pain Msk:  Symmetrical without gross deformities. Without edema, no deformity or joint abnormality.  Neurologic:  Alert and  oriented x4;  grossly normal neurologically.  Skin:   Dry and intact without significant lesions or rashes. Psychiatric: Oriented to person, place and time. Demonstrates good judgement and reason without abnormal affect or behaviors.  RELEVANT LABS AND IMAGING: CBC    Component Value Date/Time   WBC 4.6 03/11/2024 0842   RBC 3.90 03/11/2024 0842   HGB 10.7 (L) 03/11/2024 0842   HGB 12.7 03/02/2024 1604   HCT 34.7 (L) 03/11/2024 0842   HCT 41.2 03/02/2024 1604   PLT 205 03/11/2024 0842   PLT 249 03/02/2024 1604   MCV 89.0 03/11/2024 0842   MCV 88 03/02/2024 1604   MCH 27.4 03/11/2024 0842   MCHC 30.8 03/11/2024 0842   RDW 14.1 03/11/2024 0842   RDW 13.5 03/02/2024 1604   LYMPHSABS 1.3 05/04/2023 1433   MONOABS 0.6 05/04/2023 1433   EOSABS 0.2 05/04/2023 1433   BASOSABS 0.0 05/04/2023 1433    CMP     Component Value Date/Time   NA 136 03/11/2024 0842   NA 137 03/02/2024 1604   K 4.0 03/11/2024 0842   CL 102 03/11/2024 0842   CO2 26 03/11/2024 0842   GLUCOSE 218 (H) 03/11/2024 0842   BUN 19 03/11/2024 0842   BUN 18 03/02/2024 1604   CREATININE 1.16 (H) 03/11/2024 0842   CREATININE 0.81 05/30/2018 1122   CALCIUM  9.1 03/11/2024 0842   PROT 6.2 (L) 05/05/2023 0452   PROT 7.3 05/19/2017 1254   ALBUMIN 2.9 (L) 05/05/2023 0452   ALBUMIN 4.4 05/19/2017 1254   AST 18 05/05/2023 0452   ALT 14 05/05/2023 0452   ALKPHOS 30 (L) 05/05/2023 0452   BILITOT 0.6 05/05/2023 0452   BILITOT 0.3 05/19/2017 1254   GFRNONAA 45 (L)  03/11/2024 0842   GFRNONAA 53 (L) 04/26/2013 1415   GFRAA 55 (L) 07/10/2020 1627   GFRAA 61 04/26/2013 1415    Assessment: 1.  Nausea: Ongoing, better with Zofran , question relation to stenosis +/- mild gastritis 2.  Anal fissure: Seen on exam today, quite large, likely worsened by disimpaction yesterday 3.  Constipation: Continues with hard balls of stool 4.  Celiac stenosis: Has follow-up with the vascular surgeons scheduled, I think contributing to a lot of the above  Plan: 1.  Prescribed Diltiazem ointment 3 times daily x 6-8 weeks 2.  Recommend sitz bath's for 15 to 20 minutes 2-3 times a day 3.  Recommend over-the-counter RectiCare with lidocaine  as needed for pain and discomfort 4.  Start MiraLAX  daily if this does not result in soft stools then would increase dosing 5.  Will schedule follow-up here in the next 2 to 3 months so she can see  us  after seeing the vascular surgeons.  Would like to get their opinion.  Certainly before further workup here.  Reginal Capra, PA-C [facility] 04/18/2024, 10:51 AM  Cc: Pete Brand, DO

## 2024-04-18 NOTE — Telephone Encounter (Signed)
 Patient daughter calling in regards to previous prescription. States  pharmacy hasn't received it. Please advise.   [phone], Thank You

## 2024-04-20 ENCOUNTER — Ambulatory Visit: Attending: Cardiology | Admitting: Cardiology

## 2024-04-20 ENCOUNTER — Encounter: Payer: Self-pay | Admitting: Cardiology

## 2024-04-20 VITALS — BP 154/78 | HR 87 | Ht 63.0 in | Wt 146.0 lb

## 2024-04-20 DIAGNOSIS — I10 Essential (primary) hypertension: Secondary | ICD-10-CM

## 2024-04-20 DIAGNOSIS — E785 Hyperlipidemia, unspecified: Secondary | ICD-10-CM

## 2024-04-20 DIAGNOSIS — I2510 Atherosclerotic heart disease of native coronary artery without angina pectoris: Secondary | ICD-10-CM

## 2024-04-20 DIAGNOSIS — I739 Peripheral vascular disease, unspecified: Secondary | ICD-10-CM

## 2024-04-20 DIAGNOSIS — I959 Hypotension, unspecified: Secondary | ICD-10-CM

## 2024-04-20 DIAGNOSIS — R42 Dizziness and giddiness: Secondary | ICD-10-CM

## 2024-04-20 NOTE — Patient Instructions (Signed)
 Medication Instructions:  If blood pressure is greater than 150 you will need to take 50 mg of hydralazine  If blood pressure is less than 150 you will need to take 25 mg of hydralazine  *If you need a refill on your cardiac medications before your next appointment, please call your pharmacy*  Lab Work: No labs If you have labs (blood work) drawn today and your tests are completely normal, you will receive your results only by: MyChart Message (if you have MyChart) OR A paper copy in the mail If you have any lab test that is abnormal or we need to change your treatment, we will call you to review the results.  Testing/Procedures:    Please report to Radiology at the [facility] Main Entrance 30 minutes early for your test.  [address] [place], [place] [address]  How to Prepare for Your Cardiac PET/CT Stress Test:  Nothing to eat or drink, except water , 3 hours prior to arrival time.  NO caffeine/decaffeinated products, or chocolate 12 hours prior to arrival. (Please note decaffeinated beverages (teas/coffees) still contain caffeine).  If you have caffeine within 12 hours prior, the test will need to be rescheduled.  Medication instructions: Do not take nitrates (isosorbide  mononitrate, Ranexa) the day before or day of test Do not take tamsulosin the day before or morning of test Hold theophylline containing medications for 12 hours. Hold Dipyridamole 48 hours prior to the test.  Diabetic Preparation: If able to eat breakfast prior to 3 hour fasting, you may take all medications, including your insulin . Do not worry if you miss your breakfast dose of insulin  - start at your next meal. If you do not eat prior to 3 hour fast-Hold all diabetes (oral and insulin ) medications. Patients who wear a continuous glucose monitor MUST remove the device prior to scanning.  You may take your remaining medications with water .  NO perfume, cologne or lotion on chest or abdomen  area. FEMALES - Please avoid wearing dresses to this appointment.  Total time is 1 to 2 hours; you may want to bring reading material for the waiting time.  IF YOU THINK YOU MAY BE PREGNANT, OR ARE NURSING PLEASE INFORM THE TECHNOLOGIST.  In preparation for your appointment, medication and supplies will be purchased.  Appointment availability is limited, so if you need to cancel or reschedule, please call the Radiology Department Scheduler at [phone] 24 hours in advance to avoid a cancellation fee of $100.00  What to Expect When you Arrive:  Once you arrive and check in for your appointment, you will be taken to a preparation room within the Radiology Department.  A technologist or Nurse will obtain your medical history, verify that you are correctly prepped for the exam, and explain the procedure.  Afterwards, an IV will be started in your arm and electrodes will be placed on your skin for EKG monitoring during the stress portion of the exam. Then you will be escorted to the PET/CT scanner.  There, staff will get you positioned on the scanner and obtain a blood pressure and EKG.  During the exam, you will continue to be connected to the EKG and blood pressure machines.  A small, safe amount of a radioactive tracer will be injected in your IV to obtain a series of pictures of your heart along with an injection of a stress agent.    After your Exam:  It is recommended that you eat a meal and drink a caffeinated beverage to  counter act any effects of the stress agent.  Drink plenty of fluids for the remainder of the day and urinate frequently for the first couple of hours after the exam.  Your doctor will inform you of your test results within 7-10 business days.  For more information and frequently asked questions, please visit our website: [email or web address]  For questions about your test or how to prepare for your test, please call: Cardiac Imaging Nurse Navigators Office:  [phone]  Follow-Up: At [facility], you and your health needs are our priority.  As part of our continuing mission to provide you with exceptional heart care, our providers are all part of one team.  This team includes your primary Cardiologist (physician) and Advanced Practice Providers or APPs (Physician Assistants and Nurse Practitioners) who all work together to provide you with the care you need, when you need it.  Your next appointment:   6-8 week(s)  Provider:   Katlyn West, NP, Then, Randene Bustard, MD will plan to see you again in 3 month(s).    We recommend signing up for the patient portal called "MyChart".  Sign up information is provided on this After Visit Summary.  MyChart is used to connect with patients for Virtual Visits (Telemedicine).  Patients are able to view lab/test results, encounter notes, upcoming appointments, etc.  Non-urgent messages can be sent to your provider as well.   To learn more about what you can do with MyChart, go to [email or web address].

## 2024-04-27 ENCOUNTER — Ambulatory Visit (HOSPITAL_COMMUNITY)
Admission: RE | Admit: 2024-04-27 | Discharge: 2024-04-27 | Disposition: A | Discharge status: Home / Self Care | Source: Ambulatory Visit | Attending: Vascular Surgery | Admitting: Vascular Surgery

## 2024-04-27 ENCOUNTER — Ambulatory Visit: Attending: Vascular Surgery | Admitting: Physician Assistant

## 2024-04-27 VITALS — BP 149/75 | HR 64 | Temp 97.8°F | Ht 63.0 in | Wt 145.5 lb

## 2024-04-27 DIAGNOSIS — R1031 Right lower quadrant pain: Secondary | ICD-10-CM | POA: Diagnosis present

## 2024-04-27 DIAGNOSIS — K551 Chronic vascular disorders of intestine: Secondary | ICD-10-CM | POA: Diagnosis not present

## 2024-04-27 NOTE — Progress Notes (Signed)
 ____________________________________________________________  Attending physician addendum:  Thank you for sending this case to me. I have reviewed the entire note and agree with the plan.  Thank you for seeing her while I was out of the office.  In addition to the fissure treatment plan as you outlined, she should also be advised to use over-the-counter RectiCare (lidocaine ) ointment 3 times daily and as needed.  Control of the pain is essential to fissure healing.  Please have nursing communicate with her about that and also get an update for us  on how she is doing with relief of the constipation.  Have a low threshold to increase the MiraLAX  to 2 or 3 times daily if needed to get soft stools and therefore help the fissure heal. Going forward, after the fissure heals she needs ongoing treatment for the constipation to hopefully prevent future recurrence.  She probably has pelvic floor dysfunction given her age and reported need for disimpaction.  Lorella Roles, MD  ____________________________________________________________

## 2024-04-27 NOTE — Progress Notes (Signed)
 Office Note     CC:  follow up Requesting Provider:  Pete Brand, DO  HPI: [name] is a 89 y.o. (November 11, 1933) female who presents for evaluation of mesenteric artery stenosis.  She presented to the emergency department at the end of March with hypertensive urgency and chest pain.  During her workup she received a CTA abdomen and pelvis which demonstrated focal area of celiac artery dissection/occlusion.  This was deemed to be asymptomatic.  She she returns office for reevaluation with mesenteric duplex.  She continues to be without postprandial pain, food fear, or bowel habit changes.  She has had gradual weight loss over the past 3 to 5 years per her daughter.  She is followed for PAD by Dr. Katheryne Pane and is scheduled for ultrasounds next week.   Past Medical History:  Diagnosis Date   Allergy to IVP dye    Angina, class I (HCC) 04/22/2015   Atherosclerotic heart disease native coronary artery w/angina pectoris (HCC) 1992   a. 1992 s/p PTCA of LAD;  b. 1998 CABG x 3; c. 07/2001 Cath: Sev LM/LAD/LCX dzs, 3/3 patent grafts; d. 11/2013 Cath: stable graft anatomy; e. 10/2016 Cath: native 3VD, VG->OM nl, LIMA->LAD nl, VG->Diag 55.   Atherosclerotic heart disease of native coronary artery with angina pectoris (HCC) 01/14/2010   Qualifier: Diagnosis of  By: Arvie Latus MD, Moishe Angel    Atrial septal aneurysm    Barrett's esophagus 03/04/2010   Qualifier: Diagnosis of  By: Shelva Dice, Amy S    Bilateral leg edema 04/22/2015   Bradycardia, mild briefly to the 40s, asymptomatic 05/16/2013   Carotid arterial disease (HCC)    a. 05/2014 Carotid U/S: No signif bilat ICA stenosis, >60% L ECA.   Chronic anemia    Chronic diastolic CHF (congestive heart failure) (HCC)    a. 10/2016 Echo: Ef 55-60%, no rwma, Gr1 DD, triv AI, PASP , atrial septal aneurysm.   Chronic diastolic CHF (congestive heart failure), NYHA class 2 (HCC) 08/11/2017   Claudication (HCC) 05/16/2013   Peripheral arterial  occlusive disease with lifestyle limiting claudication    Degenerative joint disease 05/30/2018   DIVERTICULOSIS OF COLON 01/14/2010   Qualifier: Diagnosis of  By: Arvie Latus MD, Moishe Angel    DM (diabetes mellitus), type 2 with peripheral vascular complications (HCC)    On Oral medications.   DOE (dyspnea on exertion) 04/22/2015   Essential hypertension    FECAL INCONTINENCE 03/04/2010   Qualifier: Diagnosis of  By: Francisca Irvine PA-c, Amy S    GERD (gastroesophageal reflux disease)    Hyperlipidemia with target LDL less than 70    Inflammatory arthritis 10/13/2017   Neuropathy    Non-insulin  dependent type 2 diabetes mellitus (HCC) 10/24/2015   NSTEMI (non-ST elevated myocardial infarction) (HCC) 11/01/2016   PAD (peripheral artery disease) (HCC) 05/2013; 09/2013   a. severe calcified POP-1 & TP Trunk Dz bilat;  b . 05/2013 Diamondback Rot Athrectomy (R POP) --> PTA R Pop, DES to R Peroneal;  c. 09/2013 PTA/Stenting of L Pop [name] - retrograde access);  d. 04/2014 ABI's: R/L: 1.1/1.1.   Preoperative cardiovascular examination 04/15/2020   PUD (peptic ulcer disease)    Rheumatoid arthritis (HCC)    S/P CABG x 3 1998   LIMA-LAD, SVG-OM, SVG-D1   Severe claudication (HCC) 05/2013   Referred for Peripheral Angio (Dr. Katheryne Pane --> Dr. Delana Favors in [place])   [facility] ALLERGY 01/14/2010   Qualifier: Diagnosis of  By: Misty Amour CMA (AAMA), [name]  Status post total replacement of right hip 10/12/2018   Vertigo 01/02/2014    Past Surgical History:  Procedure Laterality Date   ATHERECTOMY Right 05/15/2013   Procedure: ATHERECTOMY;  Surgeon: Avanell Leigh, MD;  Location: [facility] CATH LAB;  Service: Cardiovascular;  Laterality: Right;  popliteal   BACK SURGERY     CARDIAC CATHETERIZATION  07/17/01   2 v CAD with LM, circ, LAD, obtuse diag, mod stenosis of diag vein graft , mild stenosis of marg vein graft, nl EF, medical treatment   CARDIAC CATHETERIZATION  11/2013   Widely patent LIMA-LAD &  SVG-OM with mild progression of proximal stenosis ~40-50% in SVG-D1; Widely patent native RCA with known severe native LCA disease   CARDIAC CATHETERIZATION N/A 11/01/2016   Procedure: Left Heart Cath and Cors/Grafts Angiography;  Surgeon: Arleen Lacer, MD;  Location: [facility] INVASIVE CV LAB;  Service: Cardiovascular: Hemodynamics: High LVEDP!.  Cors/Grafts: LM 55%, o-p LAD 100% then after D1 -> LIMA-LAD patent, SVG-Diag ~55%. small RI wiht ~70% ostial. O-p Cx 80% - pCx 70% --> SVG-bifurcating OM patent. -- med Rx   CORONARY ANGIOPLASTY  1992   PCI to LAD   CORONARY ARTERY BYPASS GRAFT  1998   LIMA-LAD, SVG-diagonal (none roughly 50% stenosis), SVG-OM   LEFT HEART CATHETERIZATION WITH CORONARY ANGIOGRAM N/A 11/27/2013   Procedure: LEFT HEART CATHETERIZATION WITH CORONARY ANGIOGRAM;  Surgeon: Arleen Lacer, MD;  Location: [facility] CATH LAB;  Service: Cardiovascular;  Laterality: N/A;   LOW EXTREMITY DOPPLERS/ABI  10/2016   Patent right SFA, popliteal and peroneal tendons. Patent left SFA and popliteal stent. 30-50% stenosis in the right common femoral and popliteal arteries, 50-74% stenosis in left profunda femoris artery. --> 1 year follow-up.  RABI - 1.2, LABI 1.1   LOWER EXTREMITY ANGIOGRAM N/A 02/22/2013   Procedure: LOWER EXTREMITY ANGIOGRAM;  Surgeon: Avanell Leigh, MD;  Location: [facility] CATH LAB;  Service: Cardiovascular;  Laterality: N/A;   LOWER EXTREMITY ANGIOGRAM N/A 07/20/2013   Procedure: LOWER EXTREMITY ANGIOGRAM;  Surgeon: Avanell Leigh, MD;  Location: [facility] CATH LAB;  Service: Cardiovascular;  Laterality: N/A;   NM MYOVIEW  LTD  04/03/2013; 5/26'/2016   Lexiscan : a)  Apical perfusion defect with no ischemia. Consider prior infarct versus breast attenuation.;; b) 5/'16: LOW RISK, Nl EF ~55-65%, No Ischemia /Infarction   NM MYOVIEW  LTD  10/'19; 4/'21   a) LOW RISK.  EF 55%.  No ischemia or infarction. b) EF estimated 81%.  Suboptimal images.  May be subtle inferior ischemia-initially read by  radiology as intermediate risk, however overread by cardiology to be mild low risk.[name]   PERCUTANEOUS STENT INTERVENTION Right 05/15/2013   Procedure: PERCUTANEOUS STENT INTERVENTION;  Surgeon: Avanell Leigh, MD;  Location: [facility] CATH LAB;  Service: Cardiovascular;  Laterality: Right;  tibioperoneal trunk   Peroneal Artery Stent  05/15/13   Diamondback rot. atherectomy of high grade segmental popliteal stenosis then Chocolate balloonPTA; then angiosculpt PTA of the prox peroneal with stent-Xpedition    pv angio  07/20/13   high-grade calcified popliteal disease with one-vessel runoff via a small anterior tibial artery that has proximal disease as well, no intervention   TOTAL HIP ARTHROPLASTY Right 10/12/2018   Procedure: RIGHT TOTAL HIP ARTHROPLASTY ANTERIOR APPROACH;  Surgeon: Arnie Lao, MD;  Location: [facility] ORS;  Service: Orthopedics;  Laterality: Right;   TOTAL HIP ARTHROPLASTY Left 11/21/2020   Procedure: LEFT TOTAL HIP ARTHROPLASTY ANTERIOR APPROACH;  Surgeon: Arnie Lao, MD;  Location: [facility] ORS;  Service:  Orthopedics;  Laterality: Left;   TRANSTHORACIC ECHOCARDIOGRAM  11/'17; 10/19   a) EF 55-60%. Gr 1 DD. Normal PAP. Aortic Sclerosis.;; b) Normal LV size and function with vigorous EF 65 to 70%.  GR 1 DD.  Severe LA dilation (suggestive of probably worsening: GR 1 DD).   TRANSTHORACIC ECHOCARDIOGRAM  03/18/2020   EF 70-75%.  Hyperdynamic.  GR 1 DD.  No R WMA.  Mild LA dilation.  Mild aortic valve sclerosis but no stenosis.-No change from prior echo   TUBAL LIGATION      Social History   Socioeconomic History   Marital status: Married    Spouse name: Not on file   Number of children: Not on file   Years of education: Not on file   Highest education level: Not on file  Occupational History   Occupation: retired  Tobacco Use   Smoking status: Never   Smokeless tobacco: Never  Vaping Use   Vaping status: Never Used  Substance and Sexual Activity   Alcohol  use: Not  Currently   Drug use: No   Sexual activity: Not on file  Other Topics Concern   Not on file  Social History Narrative   Right handed   Lives alone ( husband in rehab facility)   Caffeine- mostly coffee       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   Social Drivers of Health   Financial Resource Strain: Not on file  Food Insecurity: No Food Insecurity (03/15/2024)   Hunger Vital Sign    Worried About Running Out of Food in the Last Year: Never true    Ran Out of Food in the Last Year: Never true  Transportation Needs: No Transportation Needs (03/15/2024)   PRAPARE - Administrator, Civil Service (Medical): No    Lack of Transportation (Non-Medical): No  Physical Activity: Not on file  Stress: Not on file  Social Connections: Socially Integrated (03/11/2024)   Social Connection and Isolation Panel [NHANES]    Frequency of Communication with Friends and Family: More than three times a week    Frequency of Social Gatherings with Friends and Family: Twice a week    Attends Religious Services: More than 4 times per year    Active Member of [employer] or Organizations: No    Attends Engineer, structural: More than 4 times per year    Marital Status: Married  Catering manager Violence: Not At Risk (03/15/2024)   Humiliation, Afraid, Rape, and Kick questionnaire    Fear of Current or Ex-Partner: No    Emotionally Abused: No    Physically Abused: No    Sexually Abused: No    Family History  Adopted: Yes  Problem Relation Age of Onset   Hypertension Mother    Hyperlipidemia Mother    Hyperlipidemia Father    Hypertension Father     Current Outpatient Medications  Medication Sig Dispense Refill   acetaminophen  (TYLENOL ) 650 MG CR tablet Take 650 mg by mouth daily as needed for pain.     AMBULATORY NON FORMULARY MEDICATION Medication Name: Diltiazem 2% gel -Using your index finger, you should apply a small amount of medication inside the rectum up to your first knuckle/joint three times daily x  6-8 weeks. 45 g 1   carvedilol  (COREG ) 3.125 MG tablet Take 1 tablet (3.125 mg total) by mouth 2 (two) times daily with a meal. 180 tablet 0   Cholecalciferol  (VITAMIN D3) 10 MCG (400 UNIT) tablet Take 400 Units by mouth daily.  clopidogrel  (PLAVIX ) 75 MG tablet Take 75 mg by mouth at bedtime.     denosumab (PROLIA) 60 MG/ML SOSY injection Inject 60 mg into the skin every 6 (six) months.     ezetimibe  (ZETIA ) 10 MG tablet Take 10 mg by mouth at bedtime.     folic acid  (FOLVITE ) 1 MG tablet Take 1 mg by mouth daily.  1   gabapentin  (NEURONTIN ) 400 MG capsule Take 400 mg by mouth 2 (two) times daily.     glimepiride  (AMARYL ) 2 MG tablet Take 2 mg by mouth daily with breakfast.     hydrALAZINE  (APRESOLINE ) 25 MG tablet Take 1 tablet (25 mg total) by mouth every 8 (eight) hours. 270 tablet 0   isosorbide  mononitrate (IMDUR ) 60 MG 24 hr tablet Take 1 tablet (60 mg total) by mouth daily. 90 tablet 0   LORazepam  (ATIVAN ) 0.5 MG tablet 1 tablet Orally Once a day as needed for anxiety for 90 days     methotrexate  (RHEUMATREX) 2.5 MG tablet Take 20 mg by mouth every Tuesday. Take 8 tablets by mouth every Friday  Caution:Chemotherapy. Protect from light.     Multiple Vitamin (MULTIVITAMIN) capsule Take 1 capsule by mouth daily.     nitroGLYCERIN  (NITROSTAT ) 0.4 MG SL tablet PLACE 1 TABLET UNDER THE TONGUE EVERY 5 MINUTES AS NEEDED FOR CHEST PAIN. 450 tablet 1   ondansetron  (ZOFRAN -ODT) 8 MG disintegrating tablet Take 1 tablet (8 mg total) by mouth every 8 (eight) hours as needed for nausea. 20 tablet 1   rosuvastatin  (CRESTOR ) 20 MG tablet Take 20 mg by mouth daily.     No current facility-administered medications for this visit.    Allergies  Allergen Reactions   Fish Allergy Hives and Shortness Of Breath   Iodinated Contrast Media Shortness Of Breath and Anaphylaxis    Other reaction(s): Unknown   Latex Anaphylaxis, Hives, Shortness Of Breath, Itching and Other (See Comments)    REACTION:  wheezing Other reaction(s): Unknown   Shellfish Allergy Anaphylaxis, Hives, Shortness Of Breath and Other (See Comments)    All seafood   Amlodipine  Swelling   Evolocumab Other (See Comments)    Other reaction(s): latex on syringe Unknown reaction   Other Itching and Other (See Comments)    Patient reports allergy to perfumed detergents   Metformin Nausea And Vomiting     REVIEW OF SYSTEMS:   [X]  denotes positive finding, [ ]  denotes negative finding Cardiac  Comments:  Chest pain or chest pressure:    Shortness of breath upon exertion:    Short of breath when lying flat:    Irregular heart rhythm:        Vascular    Pain in calf, thigh, or hip brought on by ambulation:    Pain in feet at night that wakes you up from your sleep:     Blood clot in your veins:    Leg swelling:         Pulmonary    Oxygen at home:    Productive cough:     Wheezing:         Neurologic    Sudden weakness in arms or legs:     Sudden numbness in arms or legs:     Sudden onset of difficulty speaking or slurred speech:    Temporary loss of vision in one eye:     Problems with dizziness:         Gastrointestinal    Blood in stool:  Vomited blood:         Genitourinary    Burning when urinating:     Blood in urine:        Psychiatric    Major depression:         Hematologic    Bleeding problems:    Problems with blood clotting too easily:        Skin    Rashes or ulcers:        Constitutional    Fever or chills:      PHYSICAL EXAMINATION:  Vitals:   04/27/24 1012  BP: (!) 149/75  Pulse: 64  Temp: 97.8 F (36.6 C)  SpO2: 95%  Weight: 145 lb 8 oz (66 kg)  Height: 5\' 3"  (1.6 m)    General:  WDWN in NAD; vital signs documented above Gait: Not observed HENT: WNL, normocephalic Pulmonary: normal non-labored breathing , without Rales, rhonchi,  wheezing Cardiac: regular HR Abdomen: soft, NT, no masses Skin: without rashes Vascular Exam/Pulses: feet warm on  exam Extremities: without ischemic changes, without Gangrene , without cellulitis; without open wounds;  Musculoskeletal: no muscle wasting or atrophy  Neurologic: A&O X 3 Psychiatric:  The pt has Normal affect.   Non-Invasive Vascular Imaging:   Greater than 70% stenosis of the celiac, SMA, and IMA    ASSESSMENT/PLAN:: 89 y.o. female here for follow up for surveillance of mesenteric artery stenosis  Ms. Empey is an 89 year old female evaluated during recent hospitalization due to finding of small focal celiac artery dissection.  She continues to be asymptomatic and denies postprandial pain, food fear, and unexplained weight loss.  Duplex does demonstrate stenosis of her celiac, SMA, and IMA however given that stenosis is asymptomatic we will continue surveillance.  She is on Plavix  for PAD.  Patient states she has had stents placed in her right leg in the past and is scheduled to have ultrasounds and follow-up with Dr. Katheryne Pane later this month.  We will repeat her mesenteric duplex in 6 months.  She will notify the office if she develops any symptoms related to mesenteric artery stenosis.   Cordie Deters, PA-C [facility] [phone]  Clinic MD:   Susi Eric

## 2024-05-02 ENCOUNTER — Other Ambulatory Visit: Payer: Self-pay | Admitting: *Deleted

## 2024-05-02 DIAGNOSIS — K551 Chronic vascular disorders of intestine: Secondary | ICD-10-CM

## 2024-05-03 ENCOUNTER — Ambulatory Visit (INDEPENDENT_AMBULATORY_CARE_PROVIDER_SITE_OTHER): Admitting: Podiatry

## 2024-05-03 ENCOUNTER — Encounter: Payer: Self-pay | Admitting: Podiatry

## 2024-05-03 DIAGNOSIS — E1151 Type 2 diabetes mellitus with diabetic peripheral angiopathy without gangrene: Secondary | ICD-10-CM

## 2024-05-03 DIAGNOSIS — B351 Tinea unguium: Secondary | ICD-10-CM | POA: Diagnosis not present

## 2024-05-03 DIAGNOSIS — M79674 Pain in right toe(s): Secondary | ICD-10-CM | POA: Diagnosis not present

## 2024-05-03 DIAGNOSIS — L608 Other nail disorders: Secondary | ICD-10-CM

## 2024-05-03 DIAGNOSIS — M79675 Pain in left toe(s): Secondary | ICD-10-CM

## 2024-05-03 NOTE — Progress Notes (Signed)
 This patient returns to my office for at risk foot care.  This patient requires this care by a professional since this patient will be at risk due to having diabetes,  PAD  and coagulation defect.  Patient is taking plavix . Patient says she has painful long thick nails especially her big toenail right foot.   This patient is unable to cut nails herself since the patient cannot reach her nails.These nails are painful walking and wearing shoes.  This patient presents for at risk foot care today. Patient is wearing bandage lower right leg.  General Appearance  Alert, conversant and in no acute stress.  Vascular  Dorsalis pedis and posterior tibial  pulses are not  palpable  bilaterally. Absent digital hair  B/L.  Capillary return is within normal limits  bilaterally. Temperature is within normal limits  bilaterally. Venous stasis  B/l.  Neurologic  Senn-Weinstein monofilament wire test within normal limits  bilaterally. Muscle power within normal limits bilaterally.  Nails Thick disfigured discolored nails with subungual debris  from hallux to fifth toes bilaterally. No evidence of bacterial infection or drainage bilaterally.  Right pincer nail hallux.  Orthopedic  No limitations of motion  feet .  No crepitus or effusions noted.  No bony pathology or digital deformities noted.  Skin  normotropic skin with no porokeratosis noted bilaterally.  No signs of infections or ulcers noted.     Onychomycosis  Pain in right toes  Pain in left toes    Consent was obtained for treatment procedures.   Mechanical debridement of nails 1-5  bilaterally performed with a nail nipper.  Filed with dremel without incident.  Patient says she has pain in her right big toe since her last visit.   Return office visit    10 weeks               Told patient to return for periodic foot care and evaluation due to potential at risk complications.   Ruffin Cotton DPM

## 2024-05-04 ENCOUNTER — Ambulatory Visit (HOSPITAL_COMMUNITY)
Admission: RE | Admit: 2024-05-04 | Discharge: 2024-05-04 | Disposition: A | Discharge status: Home / Self Care | Source: Ambulatory Visit | Attending: Cardiology | Admitting: Cardiology

## 2024-05-04 DIAGNOSIS — I739 Peripheral vascular disease, unspecified: Secondary | ICD-10-CM

## 2024-05-04 LAB — VAS US ABI WITH/WO TBI
Left ABI: 0.73
Right ABI: 0.51

## 2024-05-06 ENCOUNTER — Ambulatory Visit: Payer: Self-pay | Admitting: Cardiology

## 2024-05-08 NOTE — Telephone Encounter (Signed)
-----   Message from Debria Fang sent at 05/06/2024  7:35 PM EDT ----- Please tell patient that her ABIs indicated moderate right and left lower extremity disease. Her lower extremity ultrasounds showed mixed plaque (start of blockages) throughout both lower extremities. Her previously placed right peroneal stent was patent (open). Left above-knee popliteal stent was also open. She does have some blockages, some of which are chronic. At this point, she should continue to try to walk/use foot petals to help increase blood flow to her legs. She should also continue plavix , zetia , crestor    Thanks! KJ

## 2024-05-08 NOTE — Telephone Encounter (Signed)
 Called patient advised of below they verbalized understanding.

## 2024-05-15 ENCOUNTER — Telehealth (HOSPITAL_COMMUNITY): Payer: Self-pay | Admitting: Emergency Medicine

## 2024-05-15 NOTE — Telephone Encounter (Signed)
 Reaching out to patient to offer assistance regarding upcoming cardiac imaging study; pt verbalizes understanding of appt date/time, parking situation and where to check in, pre-test NPO status and medications ordered, and verified current allergies; name and call back number provided for further questions should they arise Rockwell Alexandria RN Navigator Cardiac Imaging [facility] [phone] office [phone] cell

## 2024-05-16 ENCOUNTER — Ambulatory Visit (HOSPITAL_COMMUNITY)
Admission: RE | Admit: 2024-05-16 | Discharge: 2024-05-16 | Disposition: A | Discharge status: Home / Self Care | Source: Ambulatory Visit | Attending: Internal Medicine | Admitting: Internal Medicine

## 2024-05-16 ENCOUNTER — Telehealth: Payer: Self-pay | Admitting: Physician Assistant

## 2024-05-16 DIAGNOSIS — R072 Precordial pain: Secondary | ICD-10-CM | POA: Insufficient documentation

## 2024-05-16 LAB — NM PET CT CARDIAC PERFUSION MULTI W/ABSOLUTE BLOODFLOW
LV dias vol: 64 mL (ref 46–106)
LV sys vol: 24 mL
MBFR: 1.61
Nuc Rest EF: 63 %
Nuc Stress EF: 64 %
Rest MBF: 1.14 ml/g/min
Rest Nuclear Isotope Dose: 17.2 mCi
ST Depression (mm): 0 mm
Stress MBF: 1.84 ml/g/min
Stress Nuclear Isotope Dose: 17.1 mCi

## 2024-05-16 MED ORDER — RUBIDIUM RB82 GENERATOR (RUBYFILL): 17.1000 | PACK | Freq: Once | INTRAVENOUS | Status: DC

## 2024-05-16 MED ORDER — REGADENOSON 0.4 MG/5ML IV SOLN
0.4000 mg | Freq: Once | INTRAVENOUS | Status: AC
  Administered 2024-05-16: 0.4 mg via INTRAVENOUS

## 2024-05-16 MED ORDER — REGADENOSON 0.4 MG/5ML IV SOLN
INTRAVENOUS | Status: AC
  Filled 2024-05-16: qty 5

## 2024-05-16 NOTE — Telephone Encounter (Signed)
 Spoke with the pt and discussed bowel purge. She will complete today and call back with an update.

## 2024-05-16 NOTE — Telephone Encounter (Signed)
 PT would like to speak to a nurse regarding her symptoms. She has severe diarrhea to the point that she has ruined her clothing four times today. She is taking metamucil but she feels it is not working for her situation. Please advise.

## 2024-05-16 NOTE — Telephone Encounter (Signed)
 I spoke to the pt and and she states that since her office visit she has had loose stools and frequent BM with incontinence.  She states it is up to 4 times a day.  She never took the metamucil or miralax  that was recommended at office visit.  She has no rectal pain or discomfort at this time.  She is primarily concerned about the fecal incontinence.  She has taken imodium with little relief.  WE discussed overflow diarrhea with constipation but she wants to get Jennifer's opinion before doing any purge.   Please advise

## 2024-05-17 ENCOUNTER — Ambulatory Visit: Payer: Self-pay | Admitting: Cardiology

## 2024-05-22 NOTE — Telephone Encounter (Signed)
-----   Message from Debria Fang sent at 05/17/2024 11:30 AM EDT ----- Please tell patient that their recent cardiac PET study showed some evidence of ischemia (blockages in the blood vessels around the heart). EF (pump function of the heart) was normal both at rest and with stress. Overall, the study was low risk.   Looks like patient is currently scheduled to see [place] on 6/25, can we get that appointment moved up a bit to discuss possible cath? Ideally within the next week   Thanks KJ

## 2024-05-22 NOTE — Telephone Encounter (Signed)
 Called patient advised of below they verbalized understanding Patient is scheduled for the 12th at 3:35pm with Callie Goodrich PA Advised appointment and time.

## 2024-05-22 NOTE — Telephone Encounter (Signed)
 Left message to call back

## 2024-05-23 NOTE — Progress Notes (Deleted)
 Cardiology Office Note:  .   Date:  05/24/2024  ID:  [name], DOB 08/10/1933, MRN [id number] PCP: Pete Brand, DO  [facility] Providers Cardiologist:  Randene Bustard, MD PV Cardiologist:  Lauro Portal, MD {}   History of Present Illness: [name]   [name] is a 89 y.o. female  with PMHx of CAD s/p remote CABG in 1998 (LIMA-LAD, SVG-OM, SVG-D1) and most recent cath in 2017 (55% stenosis of ostial SVG to D1 but LIMA to LAD and SVG to OM were widely patent. 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   Patient admitted to hospital 3/29 - 03/14/2024 and diagnosed with CP most likely due to hypertensive urgency. Negative ischemic workup. Noted incidental finding on CTA for chronic dissection of the left celiac artery with low likelihood of an associated dissection as the stenosis was later on MRA from 2021, leading to vascular surgery consult who recommended medical management. Cardiology recommended outpatient cardiac PET stress test, which was completed in 05/2024 showing evidence of ischemia with normal wall motion, EF 63-64% and overall low risk study.  Last seen in heartcare OV 04/20/2024 with [name], PA-C for hospital follow up. Reports continued labile HTN, dizziness and no recurrent chest pain. BP elevated in office 154/78. Noted previously evaluated by PT for dizziness who suspects more related to balance issue. Recommended checking BP prior to taking hydralazine . If SBP > 150, then take hydralazine  50 mg daily. If SBP < 150, then take 25 mg daily.  Continued on Coreg  3.125 mg BID and imdur  60 mg daily.   Today, patient is accompanied by her daughter. She reports atypical CP described as achy, 4/10, associated with coughing, nonexertional, non positional. This CP has been present since recent diagnose with viral infection at urgent care about 1 week ago. This CP does not sound like angina and is more related to URI. Reports ongoing muscle aches/pains in arms x 2 years associated with nausea, which she states is similar to how she felt prior to previous CABG. However, she also reports diffuse muscle aches all over x 2 years, which is not consistent with an anginal equivalent. Also reports feeling like she is going to blackout sometimes at rest, which has been present for years and unchanged. She repeatedly says I just feel sick and want to know what's wrong.  Denies SOB, dizziness, edema, orthopnea, PND, syncope. Reports healthy diet. BP at home has been more controlled since last OV but notes 3-4 episodes of elevated SBP up to 200, which normalize by taking 50 mg of hydralazine  as recommended. She is not able to verbally provide specific BP measurements from home. She did not provide a BP log to show home reading but states BP is more controlled now using recommended hydralazine  modification.   Studies Reviewed: .   Cardiac PET 05/2024   LV perfusion is abnormal. There is evidence of ischemia. There is no evidence of infarction. Defect 1: There is a medium defect with moderate reduction in uptake present in the apical to mid anterior and apex location(s) that is reversible. There is normal wall motion in  the defect area. Consistent with ischemia.   Rest left ventricular function is normal. Rest EF: 63%. Stress left ventricular function is normal. Stress EF: 64%. End diastolic cavity size is normal. End systolic cavity size is normal.   Myocardial blood flow was computed to be 1.50ml/g/min at rest and 1.38ml/g/min at stress. Global myocardial blood flow  reserve was 1.61 and was abnormal.   Coronary calcium  assessment not performed due to prior revascularization. Prior CABG   Findings are consistent with ischemia. The study is low risk.   Medium sized area of ischemia in the mid/apical anterior/anterior lateral wall  MBFR may not be accurate in setting of prior CABG IMPRESSION: 1. No acute findings. 2. Chronic scarring and volume loss within the right lower lobe and lingula.  VAS US  LE/ ABI 04/2024 Summary:  Right:  - Mixed plaque throughout.  - Patent femoral arteries with 30-49% stenosis at the CFA and PFA.  - Occluded popliteal (AK) with distal popliteal reconstitution.  - Occluded posterior tibial and anterior tibial arteries at distal.  - Patent peroneal artery/stent where visualized with monophasic waveforms  throughout.   Left:  - Mixed plaque throughout.  - Patent femoral-popliteal arteries with 5074% stenosis of the PFA.  - Patent above-knee popliteal-TPT stent with no evidence of significant  restenosis.  - Occluded peroneal artery at distal.  - Patent posterior tibial and anterior tibial arteries at distal with  monophasic waveforms.  Summary:  Right: Resting right ankle-brachial index indicates moderate right lower  extremity arterial disease. The right toe-brachial index is abnormal.  Left: Resting left ankle-brachial index indicates moderate left lower  extremity arterial disease. The left toe-brachial index is abnormal.  Right ABIs and TBIs appear increased compared to prior study on 03/31/20.  Left ABIs and TBIs appear decreased compared to prior study on 03/31/20.   ECHO 02/2024 IMPRESSIONS   1. Left ventricular ejection fraction, by estimation, is 65 to 70%. The  left ventricle has normal function. The left ventricle has no regional  wall motion abnormalities. Left ventricular diastolic parameters are  consistent with Grade I diastolic  dysfunction (impaired relaxation).   2. Right ventricular systolic  function is normal. The right ventricular  size is normal.   3. Left atrial size was moderately dilated.   4. The mitral valve is normal in structure. No evidence of mitral valve  regurgitation. No evidence of mitral stenosis.   5. The aortic valve is tricuspid. Aortic valve regurgitation is not  visualized. No aortic stenosis is present.   6. The inferior vena cava is normal in size with greater than 50%  respiratory variability, suggesting right atrial pressure of 3 mmHg.   Comparison(s): No significant change from prior study. Prior images  reviewed side by side.   Physical Exam:   VS:  BP 122/70   Pulse 95   Ht 5' 3 (1.6 m)   Wt 143 lb 3.2 oz (65 kg)   SpO2 93%   BMI 25.37 kg/m    Wt Readings from Last 3 Encounters:  05/24/24 143 lb 3.2 oz (65 kg)  04/27/24 145 lb 8 oz (66 kg)  04/20/24 146 lb (66.2 kg)    GEN: Sitting in chair in no acute distress, accompanied by daughter.  NECK: No JVD; No carotid bruits CARDIAC: RRR, no murmurs, rubs, gallops RESPIRATORY:  Clear to auscultation without rales, wheezing or rhonchi  ABDOMEN: Soft, non-tender, non-distended EXTREMITIES:  No edema; No deformity   ASSESSMENT AND PLAN: .   HTN Previously  noted, history of labile HTN. Previously recommended checking BP prior to taking hydralazine . If SBP > 150, then take hydralazine  50 mg daily. If SBP < 150, then take 25 mg daily. BP at home has been more controlled since last OV but notes 3-4 episodes of elevated SBP up to 200, which normalize by taking 50 mg of hydralazine  as recommended. She is not able to verbally provide specific BP measurements from home. She did not provide a BP log to show home reading but states BP is more controlled now using recommended hydralazine  modification.  BP this office visit, well controlled:  122/70 Continue on Coreg  3.125 mg BID, imdur  60 mg daily, and hydralazine  25 mg TID unless SBP > 150 as above.   CAD  HLD, LDL goal < 55 1998: Remote CABG x3 with  LIMA to LAD, SVG to OM, and SVG to D1 10/2016: cath during an admission for a NSTEMI showed 55% stenosis of ostial SVG to D1 but LIMA to LAD and SVG to OM were widely patent. Recommended medical therapy.  01/2022: Myoview  was low risk with no evidence of ischemia.  02/2024  ECHO showed EF 65-70%, no regional wall motion abnormalities, grade I DD, normal RV systolic function  05/2024 Cardiac PET: evidence of ischemia (moderate reduction in uptake present in the apical/mid anterior/apex with normal wall motion), Rest EF 63%, Stress EF 65%. Overall, low risk study. No anginal symptoms.  02/2024 LDL: 129. Discussed optimal LDL control. Patient wants to avoid increasing statin dosage with history of diffuse muscle aches. Discussed injectable PCSK9i as an option and patient is willing to proceed with medication. Order referral to Lipid Clinic.  Continue crestor  20 mg daily, zetia  10 mg daily, plavix  75 mg daily  Personally reviewed cardiac PET stress test with Dr. Addie Holstein. He does not recommend proceeding with cath unless she has anginal symptoms. She currently has some atypical chest pain related to cough with URI but no angina. Patient and daughter were concerned about not proceeding with cath. Called and spoke with Dr. Addie Holstein again during visit who re-iterated that he does not feel like a cath is necessary given low risk stress test and no angina. He stated he would be willing to perform cath if patient was adamant about this but wanted patient to be aware that she is higher risk for cath given advanced age, prior CABG, and other comorbidities including recent celiac artery dissection as well as the fact that this would likely only be a diagnostic cath. Reviewed this recommendation with patient. She also mentioned she has a contrast allergy with prior anaphylaxis which is another reason not to proceed with cath. Patient and daughter stated they want to think about it more and will call back and let us  know. Will  hold off on cath for now. If she does call back and wants to proceed with cath, she would need to be pre-medicated prior to this.  We reviewed the full risk and benefits for cath with patient and daughter.   Celiac artery dissection  Noted incidental finding on CTA 03/11/2024 for chronic dissection of the left celiac artery with low likelihood of an associated dissection as the stenosis was later on MRA from 2021, leading to vascular surgery consult who recommended medical management.   PAD s/p atherectomy/ PTA of distal right SFA and DES to peroneal artery in 05/2013 followed by intervention of left popliteal and tibioperoneal trunk disease in 09/2013 04/2024 VAS US  LE/ ABI: Right and Left LE show mixed  plaque.  ABI indicated moderate right and left lower extremity disease.  Followed by Dr. Katheryne Pane  Continue Crestor , Zetia , and Plavix  as above.   Informed Consent   Shared Decision Making/Informed Consent{ The risks [stroke (1 in 1000), death (1 in 1000), kidney failure [usually temporary] (1 in 500), bleeding (1 in 200), allergic reaction [possibly serious] (1 in 200)], benefits (diagnostic support and management of coronary artery disease) and alternatives of a cardiac catheterization were discussed in detail with Ms. Hum and she is willing to proceed.  Dispo: Follow up with Dr. Vera Gip as scheduled  Level 5 billing because [facility], PA-C and I spent greater than 1 hour in room with patient and daughter as well as additional 12 minutes on phone with Dr. Vera Gip.   Signed, Metta Actis, PA-C

## 2024-05-24 ENCOUNTER — Ambulatory Visit: Attending: Student | Admitting: Physician Assistant

## 2024-05-24 ENCOUNTER — Encounter: Payer: Self-pay | Admitting: Physician Assistant

## 2024-05-24 VITALS — BP 122/70 | HR 95 | Ht 63.0 in | Wt 143.2 lb

## 2024-05-24 DIAGNOSIS — I10 Essential (primary) hypertension: Secondary | ICD-10-CM | POA: Diagnosis not present

## 2024-05-24 DIAGNOSIS — E785 Hyperlipidemia, unspecified: Secondary | ICD-10-CM | POA: Diagnosis not present

## 2024-05-24 DIAGNOSIS — Z79899 Other long term (current) drug therapy: Secondary | ICD-10-CM

## 2024-05-24 DIAGNOSIS — I739 Peripheral vascular disease, unspecified: Secondary | ICD-10-CM

## 2024-05-24 DIAGNOSIS — I7779 Dissection of other artery: Secondary | ICD-10-CM | POA: Diagnosis not present

## 2024-05-24 DIAGNOSIS — I2510 Atherosclerotic heart disease of native coronary artery without angina pectoris: Secondary | ICD-10-CM

## 2024-05-24 NOTE — Patient Instructions (Addendum)
 Medication Instructions:  Your physician recommends that you continue on your current medications as directed. Please refer to the Current Medication list given to you today.  *If you need a refill on your cardiac medications before your next appointment, please call your pharmacy*   You have been referred to [facility] HERE IN THE OFFICE TO SEE THE PHARMACIST FOR CONSIDERATION [id number]    Follow-Up: At [facility], you and your health needs are our priority.  As part of our continuing mission to provide you with exceptional heart care, our providers are all part of one team.  This team includes your primary Cardiologist (physician) and Advanced Practice Providers or APPs (Physician Assistants and Nurse Practitioners) who all work together to provide you with the care you need, when you need it.  Your next appointment:   KEEP UPCOMING APPOINTMENT WITH DR. HARDING

## 2024-05-25 ENCOUNTER — Encounter: Payer: Self-pay | Admitting: Physician Assistant

## 2024-05-25 NOTE — Progress Notes (Deleted)
 Cardiology Office Note:  .   Date:  05/24/2024  ID:  [name], DOB 08/10/1933, MRN [id number] PCP: Pete Brand, DO  [facility] Providers Cardiologist:  Randene Bustard, MD PV Cardiologist:  Lauro Portal, MD {}   History of Present Illness: [name]   [name] is a 89 y.o. female  with PMHx of CAD s/p remote CABG in 1998 (LIMA-LAD, SVG-OM, SVG-D1) and most recent cath in 2017 (55% stenosis of ostial SVG to D1 but LIMA to LAD and SVG to OM were widely patent. 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   Patient admitted to hospital 3/29 - 03/14/2024 and diagnosed with CP most likely due to hypertensive urgency. Negative ischemic workup. Noted incidental finding on CTA for chronic dissection of the left celiac artery with low likelihood of an associated dissection as the stenosis was later on MRA from 2021, leading to vascular surgery consult who recommended medical management. Cardiology recommended outpatient cardiac PET stress test, which was completed in 05/2024 showing evidence of ischemia with normal wall motion, EF 63-64% and overall low risk study.  Last seen in heartcare OV 04/20/2024 with [name], PA-C for hospital follow up. Reports continued labile HTN, dizziness and no recurrent chest pain. BP elevated in office 154/78. Noted previously evaluated by PT for dizziness who suspects more related to balance issue. Recommended checking BP prior to taking hydralazine . If SBP > 150, then take hydralazine  50 mg daily. If SBP < 150, then take 25 mg daily.  Continued on Coreg  3.125 mg BID and imdur  60 mg daily.   Today, patient is accompanied by her daughter. She reports atypical CP described as achy, 4/10, associated with coughing, nonexertional, non positional. This CP has been present since recent diagnose with viral infection at urgent care about 1 week ago. This CP does not sound like angina and is more related to URI. Reports ongoing muscle aches/pains in arms x 2 years associated with nausea, which she states is similar to how she felt prior to previous CABG. However, she also reports diffuse muscle aches all over x 2 years, which is not consistent with an anginal equivalent. Also reports feeling like she is going to blackout sometimes at rest, which has been present for years and unchanged. She repeatedly says I just feel sick and want to know what's wrong.  Denies SOB, dizziness, edema, orthopnea, PND, syncope. Reports healthy diet. BP at home has been more controlled since last OV but notes 3-4 episodes of elevated SBP up to 200, which normalize by taking 50 mg of hydralazine  as recommended. She is not able to verbally provide specific BP measurements from home. She did not provide a BP log to show home reading but states BP is more controlled now using recommended hydralazine  modification.   Studies Reviewed: .   Cardiac PET 05/2024   LV perfusion is abnormal. There is evidence of ischemia. There is no evidence of infarction. Defect 1: There is a medium defect with moderate reduction in uptake present in the apical to mid anterior and apex location(s) that is reversible. There is normal wall motion in  the defect area. Consistent with ischemia.   Rest left ventricular function is normal. Rest EF: 63%. Stress left ventricular function is normal. Stress EF: 64%. End diastolic cavity size is normal. End systolic cavity size is normal.   Myocardial blood flow was computed to be 1.50ml/g/min at rest and 1.38ml/g/min at stress. Global myocardial blood flow  reserve was 1.61 and was abnormal.   Coronary calcium  assessment not performed due to prior revascularization. Prior CABG   Findings are consistent with ischemia. The study is low risk.   Medium sized area of ischemia in the mid/apical anterior/anterior lateral wall  MBFR may not be accurate in setting of prior CABG IMPRESSION: 1. No acute findings. 2. Chronic scarring and volume loss within the right lower lobe and lingula.  VAS US  LE/ ABI 04/2024 Summary:  Right:  - Mixed plaque throughout.  - Patent femoral arteries with 30-49% stenosis at the CFA and PFA.  - Occluded popliteal (AK) with distal popliteal reconstitution.  - Occluded posterior tibial and anterior tibial arteries at distal.  - Patent peroneal artery/stent where visualized with monophasic waveforms  throughout.   Left:  - Mixed plaque throughout.  - Patent femoral-popliteal arteries with 5074% stenosis of the PFA.  - Patent above-knee popliteal-TPT stent with no evidence of significant  restenosis.  - Occluded peroneal artery at distal.  - Patent posterior tibial and anterior tibial arteries at distal with  monophasic waveforms.  Summary:  Right: Resting right ankle-brachial index indicates moderate right lower  extremity arterial disease. The right toe-brachial index is abnormal.  Left: Resting left ankle-brachial index indicates moderate left lower  extremity arterial disease. The left toe-brachial index is abnormal.  Right ABIs and TBIs appear increased compared to prior study on 03/31/20.  Left ABIs and TBIs appear decreased compared to prior study on 03/31/20.   ECHO 02/2024 IMPRESSIONS   1. Left ventricular ejection fraction, by estimation, is 65 to 70%. The  left ventricle has normal function. The left ventricle has no regional  wall motion abnormalities. Left ventricular diastolic parameters are  consistent with Grade I diastolic  dysfunction (impaired relaxation).   2. Right ventricular systolic  function is normal. The right ventricular  size is normal.   3. Left atrial size was moderately dilated.   4. The mitral valve is normal in structure. No evidence of mitral valve  regurgitation. No evidence of mitral stenosis.   5. The aortic valve is tricuspid. Aortic valve regurgitation is not  visualized. No aortic stenosis is present.   6. The inferior vena cava is normal in size with greater than 50%  respiratory variability, suggesting right atrial pressure of 3 mmHg.   Comparison(s): No significant change from prior study. Prior images  reviewed side by side.   Physical Exam:   VS:  BP 122/70   Pulse 95   Ht 5' 3 (1.6 m)   Wt 143 lb 3.2 oz (65 kg)   SpO2 93%   BMI 25.37 kg/m    Wt Readings from Last 3 Encounters:  05/24/24 143 lb 3.2 oz (65 kg)  04/27/24 145 lb 8 oz (66 kg)  04/20/24 146 lb (66.2 kg)    GEN: Sitting in chair in no acute distress, accompanied by daughter.  NECK: No JVD; No carotid bruits CARDIAC: RRR, no murmurs, rubs, gallops RESPIRATORY:  Clear to auscultation without rales, wheezing or rhonchi  ABDOMEN: Soft, non-tender, non-distended EXTREMITIES:  No edema; No deformity   ASSESSMENT AND PLAN: .   HTN Previously  noted, history of labile HTN. Previously recommended checking BP prior to taking hydralazine . If SBP > 150, then take hydralazine  50 mg daily. If SBP < 150, then take 25 mg daily. BP at home has been more controlled since last OV but notes 3-4 episodes of elevated SBP up to 200, which normalize by taking 50 mg of hydralazine  as recommended. She is not able to verbally provide specific BP measurements from home. She did not provide a BP log to show home reading but states BP is more controlled now using recommended hydralazine  modification.  BP this office visit, well controlled:  122/70 Continue on Coreg  3.125 mg BID, imdur  60 mg daily, and hydralazine  25 mg TID unless SBP > 150 as above.   CAD  HLD, LDL goal < 55 1998: Remote CABG x3 with  LIMA to LAD, SVG to OM, and SVG to D1 10/2016: cath during an admission for a NSTEMI showed 55% stenosis of ostial SVG to D1 but LIMA to LAD and SVG to OM were widely patent. Recommended medical therapy.  01/2022: Myoview  was low risk with no evidence of ischemia.  02/2024  ECHO showed EF 65-70%, no regional wall motion abnormalities, grade I DD, normal RV systolic function  05/2024 Cardiac PET: evidence of ischemia (moderate reduction in uptake present in the apical/mid anterior/apex with normal wall motion), Rest EF 63%, Stress EF 65%. Overall, low risk study. No anginal symptoms.  02/2024 LDL: 129. Discussed optimal LDL control. Patient wants to avoid increasing statin dosage with history of diffuse muscle aches. Discussed injectable PCSK9i as an option and patient is willing to proceed with medication. Order referral to Lipid Clinic.  Continue crestor  20 mg daily, zetia  10 mg daily, plavix  75 mg daily  Personally reviewed cardiac PET stress test with Dr. Addie Holstein. He does not recommend proceeding with cath unless she has anginal symptoms. She currently has some atypical chest pain related to cough with URI but no angina. Patient and daughter were concerned about not proceeding with cath. Called and spoke with Dr. Addie Holstein again during visit who re-iterated that he does not feel like a cath is necessary given low risk stress test and no angina. He stated he would be willing to perform cath if patient was adamant about this but wanted patient to be aware that she is higher risk for cath given advanced age, prior CABG, and other comorbidities including recent celiac artery dissection as well as the fact that this would likely only be a diagnostic cath. Reviewed this recommendation with patient. She also mentioned she has a contrast allergy with prior anaphylaxis which is another reason not to proceed with cath. Patient and daughter stated they want to think about it more and will call back and let us  know. Will  hold off on cath for now. If she does call back and wants to proceed with cath, she would need to be pre-medicated prior to this.  We reviewed the full risk and benefits for cath with patient and daughter.   Celiac artery dissection  Noted incidental finding on CTA 03/11/2024 for chronic dissection of the left celiac artery with low likelihood of an associated dissection as the stenosis was later on MRA from 2021, leading to vascular surgery consult who recommended medical management.   PAD s/p atherectomy/ PTA of distal right SFA and DES to peroneal artery in 05/2013 followed by intervention of left popliteal and tibioperoneal trunk disease in 09/2013 04/2024 VAS US  LE/ ABI: Right and Left LE show mixed  plaque.  ABI indicated moderate right and left lower extremity disease.  Followed by Dr. Katheryne Pane  Continue Crestor , Zetia , and Plavix  as above.   Informed Consent   Shared Decision Making/Informed Consent{ The risks [stroke (1 in 1000), death (1 in 1000), kidney failure [usually temporary] (1 in 500), bleeding (1 in 200), allergic reaction [possibly serious] (1 in 200)], benefits (diagnostic support and management of coronary artery disease) and alternatives of a cardiac catheterization were discussed in detail with Ms. Hum and she is willing to proceed.  Dispo: Follow up with Dr. Vera Gip as scheduled  Level 5 billing because [facility], PA-C and I spent greater than 1 hour in room with patient and daughter as well as additional 12 minutes on phone with Dr. Vera Gip.   Signed, Metta Actis, PA-C

## 2024-05-25 NOTE — Progress Notes (Signed)
 Cardiology Office Note:  .   Date:  05/24/2024  ID:  [name], DOB 08/10/1933, MRN [id number] PCP: Pete Brand, DO  [facility] Providers Cardiologist:  Randene Bustard, MD PV Cardiologist:  Lauro Portal, MD {}   History of Present Illness: [name]   [name] is a 89 y.o. female  with PMHx of CAD s/p remote CABG in 1998 (LIMA-LAD, SVG-OM, SVG-D1) and most recent cath in 2017 (55% stenosis of ostial SVG to D1 but LIMA to LAD and SVG to OM were widely patent. 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   Patient admitted to hospital 3/29 - 03/14/2024 and diagnosed with CP most likely due to hypertensive urgency. Negative ischemic workup. Noted incidental finding on CTA for chronic dissection of the left celiac artery with low likelihood of an associated dissection as the stenosis was later on MRA from 2021, leading to vascular surgery consult who recommended medical management. Cardiology recommended outpatient cardiac PET stress test, which was completed in 05/2024 showing evidence of ischemia with normal wall motion, EF 63-64% and overall low risk study.  Last seen in heartcare OV 04/20/2024 with [name], PA-C for hospital follow up. Reports continued labile HTN, dizziness and no recurrent chest pain. BP elevated in office 154/78. Noted previously evaluated by PT for dizziness who suspects more related to balance issue. Recommended checking BP prior to taking hydralazine . If SBP > 150, then take hydralazine  50 mg daily. If SBP < 150, then take 25 mg daily.  Continued on Coreg  3.125 mg BID and imdur  60 mg daily.   Today, patient is accompanied by her daughter. She reports atypical CP described as achy, 4/10, associated with coughing, nonexertional, non positional. This CP has been present since recent diagnose with viral infection at urgent care about 1 week ago. This CP does not sound like angina and is more related to URI. Reports ongoing muscle aches/pains in arms x 2 years associated with nausea, which she states is similar to how she felt prior to previous CABG. However, she also reports diffuse muscle aches all over x 2 years, which is not consistent with an anginal equivalent. Also reports feeling like she is going to blackout sometimes at rest, which has been present for years and unchanged. She repeatedly says I just feel sick and want to know what's wrong.  Denies SOB, dizziness, edema, orthopnea, PND, syncope. Reports healthy diet. BP at home has been more controlled since last OV but notes 3-4 episodes of elevated SBP up to 200, which normalize by taking 50 mg of hydralazine  as recommended. She is not able to verbally provide specific BP measurements from home. She did not provide a BP log to show home reading but states BP is more controlled now using recommended hydralazine  modification.   Studies Reviewed: .   Cardiac PET 05/2024   LV perfusion is abnormal. There is evidence of ischemia. There is no evidence of infarction. Defect 1: There is a medium defect with moderate reduction in uptake present in the apical to mid anterior and apex location(s) that is reversible. There is normal wall motion in  the defect area. Consistent with ischemia.   Rest left ventricular function is normal. Rest EF: 63%. Stress left ventricular function is normal. Stress EF: 64%. End diastolic cavity size is normal. End systolic cavity size is normal.   Myocardial blood flow was computed to be 1.50ml/g/min at rest and 1.38ml/g/min at stress. Global myocardial blood flow  reserve was 1.61 and was abnormal.   Coronary calcium  assessment not performed due to prior revascularization. Prior CABG   Findings are consistent with ischemia. The study is low risk.   Medium sized area of ischemia in the mid/apical anterior/anterior lateral wall  MBFR may not be accurate in setting of prior CABG IMPRESSION: 1. No acute findings. 2. Chronic scarring and volume loss within the right lower lobe and lingula.  VAS US  LE/ ABI 04/2024 Summary:  Right:  - Mixed plaque throughout.  - Patent femoral arteries with 30-49% stenosis at the CFA and PFA.  - Occluded popliteal (AK) with distal popliteal reconstitution.  - Occluded posterior tibial and anterior tibial arteries at distal.  - Patent peroneal artery/stent where visualized with monophasic waveforms  throughout.   Left:  - Mixed plaque throughout.  - Patent femoral-popliteal arteries with 5074% stenosis of the PFA.  - Patent above-knee popliteal-TPT stent with no evidence of significant  restenosis.  - Occluded peroneal artery at distal.  - Patent posterior tibial and anterior tibial arteries at distal with  monophasic waveforms.  Summary:  Right: Resting right ankle-brachial index indicates moderate right lower  extremity arterial disease. The right toe-brachial index is abnormal.  Left: Resting left ankle-brachial index indicates moderate left lower  extremity arterial disease. The left toe-brachial index is abnormal.  Right ABIs and TBIs appear increased compared to prior study on 03/31/20.  Left ABIs and TBIs appear decreased compared to prior study on 03/31/20.   ECHO 02/2024 IMPRESSIONS   1. Left ventricular ejection fraction, by estimation, is 65 to 70%. The  left ventricle has normal function. The left ventricle has no regional  wall motion abnormalities. Left ventricular diastolic parameters are  consistent with Grade I diastolic  dysfunction (impaired relaxation).   2. Right ventricular systolic  function is normal. The right ventricular  size is normal.   3. Left atrial size was moderately dilated.   4. The mitral valve is normal in structure. No evidence of mitral valve  regurgitation. No evidence of mitral stenosis.   5. The aortic valve is tricuspid. Aortic valve regurgitation is not  visualized. No aortic stenosis is present.   6. The inferior vena cava is normal in size with greater than 50%  respiratory variability, suggesting right atrial pressure of 3 mmHg.   Comparison(s): No significant change from prior study. Prior images  reviewed side by side.   Physical Exam:   VS:  BP 122/70   Pulse 95   Ht 5' 3 (1.6 m)   Wt 143 lb 3.2 oz (65 kg)   SpO2 93%   BMI 25.37 kg/m    Wt Readings from Last 3 Encounters:  05/24/24 143 lb 3.2 oz (65 kg)  04/27/24 145 lb 8 oz (66 kg)  04/20/24 146 lb (66.2 kg)    GEN: Sitting in chair in no acute distress, accompanied by daughter.  NECK: No JVD; No carotid bruits CARDIAC: RRR, no murmurs, rubs, gallops RESPIRATORY:  Clear to auscultation without rales, wheezing or rhonchi  ABDOMEN: Soft, non-tender, non-distended EXTREMITIES:  No edema; No deformity   ASSESSMENT AND PLAN: .   HTN Previously  noted, history of labile HTN. Previously recommended checking BP prior to taking hydralazine . If SBP > 150, then take hydralazine  50 mg daily. If SBP < 150, then take 25 mg daily. BP at home has been more controlled since last OV but notes 3-4 episodes of elevated SBP up to 200, which normalize by taking 50 mg of hydralazine  as recommended. She is not able to verbally provide specific BP measurements from home. She did not provide a BP log to show home reading but states BP is more controlled now using recommended hydralazine  modification.  BP this office visit, well controlled:  122/70 Continue on Coreg  3.125 mg BID, imdur  60 mg daily, and hydralazine  25 mg TID unless SBP > 150 as above.   CAD  HLD, LDL goal < 55 1998: Remote CABG x3 with  LIMA to LAD, SVG to OM, and SVG to D1 10/2016: cath during an admission for a NSTEMI showed 55% stenosis of ostial SVG to D1 but LIMA to LAD and SVG to OM were widely patent. Recommended medical therapy.  01/2022: Myoview  was low risk with no evidence of ischemia.  02/2024  ECHO showed EF 65-70%, no regional wall motion abnormalities, grade I DD, normal RV systolic function  05/2024 Cardiac PET: evidence of ischemia (moderate reduction in uptake present in the apical/mid anterior/apex with normal wall motion), Rest EF 63%, Stress EF 65%. Overall, low risk study. No anginal symptoms.  02/2024 LDL: 129. Discussed optimal LDL control. Patient wants to avoid increasing statin dosage with history of diffuse muscle aches. Discussed injectable PCSK9i as an option and patient is willing to proceed with medication. Order referral to [facility].  Continue crestor  20 mg daily, zetia  10 mg daily, plavix  75 mg daily  Personally reviewed cardiac PET stress test with Dr. Addie Holstein. He does not recommend proceeding with cath unless she has anginal symptoms. She currently has some atypical chest pain related to cough with URI but no angina. Patient and daughter were concerned about not proceeding with cath. Called and spoke with Dr. Addie Holstein again during visit who re-iterated that he does not feel like a cath is necessary given low risk stress test and no angina. He stated he would be willing to perform cath if patient was adamant about this but wanted patient to be aware that she is higher risk for cath given advanced age, prior CABG, and other comorbidities including recent celiac artery dissection as well as the fact that this would likely only be a diagnostic cath. Reviewed this recommendation with patient. She also mentioned she has a contrast allergy with prior anaphylaxis which is another reason not to proceed with cath. Patient and daughter stated they want to think about it more and will call back and let us  know. Will  hold off on cath for now. If she does call back and wants to proceed with cath, she would need to be pre-medicated prior to this.  We reviewed the full risk and benefits for cath with patient and daughter.   Celiac artery dissection  Noted incidental finding on CTA 03/11/2024 for chronic dissection of the left celiac artery with low likelihood of an associated dissection as the stenosis was later on MRA from 2021, leading to vascular surgery consult who recommended medical management.   PAD s/p atherectomy/ PTA of distal right SFA and DES to peroneal artery in 05/2013 followed by intervention of left popliteal and tibioperoneal trunk disease in 09/2013 04/2024 VAS US  LE/ ABI: Right and Left LE show mixed  plaque.  ABI indicated moderate right and left lower extremity disease.  Followed by Dr. Katheryne Pane  Continue Crestor , Zetia , and Plavix  as above.   Informed Consent   Shared Decision Making/Informed Consent{ The risks [stroke (1 in 1000), death (1 in 1000), kidney failure [usually temporary] (1 in 500), bleeding (1 in 200), allergic reaction [possibly serious] (1 in 200)], benefits (diagnostic support and management of coronary artery disease) and alternatives of a cardiac catheterization were discussed in detail with Ms. Hum and she is willing to proceed.  Dispo: Follow up with Dr. Vera Gip as scheduled  Level 5 billing because [facility], PA-C and I spent greater than 1 hour in room with patient and daughter as well as additional 12 minutes on phone with Dr. Vera Gip.   Signed, Metta Actis, PA-C

## 2024-06-01 HISTORY — PX: OTHER SURGICAL HISTORY: SHX169

## 2024-06-06 ENCOUNTER — Ambulatory Visit: Admitting: Emergency Medicine

## 2024-06-11 ENCOUNTER — Encounter: Payer: Self-pay | Admitting: Podiatry

## 2024-06-11 ENCOUNTER — Ambulatory Visit: Admitting: Podiatry

## 2024-06-11 DIAGNOSIS — L600 Ingrowing nail: Secondary | ICD-10-CM

## 2024-06-11 DIAGNOSIS — E1151 Type 2 diabetes mellitus with diabetic peripheral angiopathy without gangrene: Secondary | ICD-10-CM

## 2024-06-11 DIAGNOSIS — I739 Peripheral vascular disease, unspecified: Secondary | ICD-10-CM | POA: Diagnosis not present

## 2024-06-11 NOTE — Progress Notes (Signed)
 Subjective:  Patient ID: [name], female    DOB: 07-26-1933,   MRN: [id number]  Chief Complaint  Patient presents with   Ingrown Toenail    Right foot great toe nail ingrown. 10 pain. NIDDM A1C ?    89 y.o. female presents for concern of right great toe pain. She has a history of PAD. Relates burning and tingling in their feet. Patient is diabetic and last A1c was  Lab Results  Component Value Date   HGBA1C 8.8 (H) 03/11/2024   .   PCP:  Gerome Brunet, DO    . Denies any other pedal complaints. Denies n/v/f/c.   Past Medical History:  Diagnosis Date   Allergy to IVP dye    Angina, class I (HCC) 04/22/2015   Atherosclerotic heart disease native coronary artery w/angina pectoris (HCC) 1992   a. 1992 s/p PTCA of LAD;  b. 1998 CABG x 3; c. 07/2001 Cath: Sev LM/LAD/LCX dzs, 3/3 patent grafts; d. 11/2013 Cath: stable graft anatomy; e. 10/2016 Cath: native 3VD, VG->OM nl, LIMA->LAD nl, VG->Diag 55.   Atherosclerotic heart disease of native coronary artery with angina pectoris (HCC) 01/14/2010   Qualifier: Diagnosis of  By: Debrah MD, Lamar BIRCH    Atrial septal aneurysm    Barrett's esophagus 03/04/2010   Qualifier: Diagnosis of  By: Ever Riggers, Amy S    Bilateral leg edema 04/22/2015   Bradycardia, mild briefly to the 40s, asymptomatic 05/16/2013   Carotid arterial disease (HCC)    a. 05/2014 Carotid U/S: No signif bilat ICA stenosis, >60% L ECA.   Chronic anemia    Chronic diastolic CHF (congestive heart failure) (HCC)    a. 10/2016 Echo: Ef 55-60%, no rwma, Gr1 DD, triv AI, PASP , atrial septal aneurysm.   Chronic diastolic CHF (congestive heart failure), NYHA class 2 (HCC) 08/11/2017   Claudication (HCC) 05/16/2013   Peripheral arterial occlusive disease with lifestyle limiting claudication    Degenerative joint disease 05/30/2018   DIVERTICULOSIS OF COLON 01/14/2010   Qualifier: Diagnosis of  By: Debrah MD, Lamar BIRCH    DM (diabetes mellitus), type 2 with  peripheral vascular complications (HCC)    On Oral medications.   DOE (dyspnea on exertion) 04/22/2015   Essential hypertension    FECAL INCONTINENCE 03/04/2010   Qualifier: Diagnosis of  By: Ever PA-c, Amy S    GERD (gastroesophageal reflux disease)    Hyperlipidemia with target LDL less than 70    Inflammatory arthritis 10/13/2017   Neuropathy    Non-insulin  dependent type 2 diabetes mellitus (HCC) 10/24/2015   NSTEMI (non-ST elevated myocardial infarction) (HCC) 11/01/2016   PAD (peripheral artery disease) (HCC) 05/2013; 09/2013   a. severe calcified POP-1 & TP Trunk Dz bilat;  b . 05/2013 [place] Rot Athrectomy (R POP) --> PTA R Pop, DES to R Peroneal;  c. 09/2013 PTA/Stenting of L Pop OLINDA Clause - retrograde access);  d. 04/2014 ABI's: R/L: 1.1/1.1.   Preoperative cardiovascular examination 04/15/2020   PUD (peptic ulcer disease)    Rheumatoid arthritis (HCC)    S/P CABG x 3 1998   LIMA-LAD, SVG-OM, SVG-D1   Severe claudication (HCC) 05/2013   Referred for Peripheral Angio (Dr. Court --> Dr. Zachary Clause in [place])   [facility] ALLERGY 01/14/2010   Qualifier: Diagnosis of  By: Marcelo CMA (AAMA), [name]     Status post total replacement of right hip 10/12/2018   Vertigo 01/02/2014    Objective:  Physical Exam: Vascular: DP/PT pulses non palpable bilateral.  CFT <3 seconds. Absent hair growth on digits. Edema noted to bilateral lower extremities. Xerosis noted bilaterally.  Skin. No lacerations or abrasions bilateral feet. Nails 1-5 bilateral  are thickened discolored and elongated with subungual debris. No open ulcerations or signs of infection noted Musculoskeletal: MMT 5/5 bilateral lower extremities in DF, PF, Inversion and Eversion. Deceased ROM in DF of ankle joint.  Neurological: Sensation intact to light touch. Protective sensation diminished bilateral.   BI Findings:  +---------+------------------+-----+----------+  Right   Rt Pressure (mmHg)IndexWaveform     +---------+------------------+-----+----------+  Brachial 152                                +---------+------------------+-----+----------+  PTA     67                0.44 monophasic  +---------+------------------+-----+----------+  DP      77                0.51 monophasic  +---------+------------------+-----+----------+  Great Toe44                0.29 Abnormal    +---------+------------------+-----+----------+   +---------+------------------+-----+----------+  Left    Lt Pressure (mmHg)IndexWaveform    +---------+------------------+-----+----------+  Brachial 148                                +---------+------------------+-----+----------+  PTA     97                0.64 biphasic    +---------+------------------+-----+----------+  DP      111               0.73 monophasic  +---------+------------------+-----+----------+  Great Toe30                0.20 Abnormal    +---------+------------------+-----+----------+    +-------+-----------+-----------+------------+------------+  ABI/TBIToday's ABIToday's TBIPrevious ABIPrevious TBI  +-------+-----------+-----------+------------+------------+  Right 0.51       0.29       0.39        0.0           +-------+-----------+-----------+------------+------------+  Left  0.73       0.20       1.12        0.91          +-------+-----------+-----------+------------+------------+         Summary:  Right: Resting right ankle-brachial index indicates moderate right lower  extremity arterial disease. The right toe-brachial index is abnormal.   Left: Resting left ankle-brachial index indicates moderate left lower  extremity arterial disease. The left toe-brachial index is abnormal.   Right ABIs and TBIs appear increased compared to prior study on 03/31/20.   Left ABIs and TBIs appear decreased compared to prior study on 03/31/20.       Assessment:   1. Ingrown right  greater toenail   2. PAD (peripheral artery disease) (HCC)   3. Type II diabetes mellitus with peripheral circulatory disorder [facility])      Plan:  Patient was evaluated and treated and all questions answered. Discussed ingrown toenails etiology and treatment options including procedure for removal vs conservative care.  Patient requesting removal of ingrown nail today. Discussed this would not be in her best interest given her blood flow concerns. Discussed simple debridement instead to gut out a portion of the nail at the end. Patient in agreement.  Lidocaine  cream used and nail debrided in slant back fashion to patient comfort.  Advised on soaks and neosporin  Return as scheduled for rfc.   Asberry Failing, DPM

## 2024-06-21 ENCOUNTER — Telehealth: Payer: Self-pay | Admitting: Physician Assistant

## 2024-06-21 NOTE — Telephone Encounter (Signed)
 New Message:      Patient wants to know is Lorette, still wants her to see the Pharm-D? When I looked at the referral, it said do not schedule.[name]

## 2024-06-21 NOTE — Telephone Encounter (Signed)
 From: Nidia Hopkins Sent: 06/08/2024   9:12 AM EDT To: Lorette CINDERELLA Kapur, PA-C Subject: referral                                        Good Morning,   We are closing this referral due to 3 unsuccessful attempts at contacting the patient.   Thank you      I sent PharmD a message to call patient

## 2024-07-01 ENCOUNTER — Other Ambulatory Visit: Payer: Self-pay

## 2024-07-01 ENCOUNTER — Emergency Department (HOSPITAL_COMMUNITY)
Admission: EM | Admit: 2024-07-01 | Discharge: 2024-07-02 | Disposition: A | Discharge status: Home / Self Care | Attending: Emergency Medicine | Admitting: Emergency Medicine

## 2024-07-01 DIAGNOSIS — Z91040 Latex allergy status: Secondary | ICD-10-CM | POA: Insufficient documentation

## 2024-07-01 DIAGNOSIS — M79604 Pain in right leg: Secondary | ICD-10-CM | POA: Insufficient documentation

## 2024-07-01 DIAGNOSIS — M7989 Other specified soft tissue disorders: Secondary | ICD-10-CM

## 2024-07-01 DIAGNOSIS — Z7902 Long term (current) use of antithrombotics/antiplatelets: Secondary | ICD-10-CM | POA: Diagnosis not present

## 2024-07-01 DIAGNOSIS — R600 Localized edema: Secondary | ICD-10-CM | POA: Insufficient documentation

## 2024-07-01 DIAGNOSIS — M79605 Pain in left leg: Secondary | ICD-10-CM | POA: Insufficient documentation

## 2024-07-01 LAB — CBC WITH DIFFERENTIAL/PLATELET
Abs Immature Granulocytes: 0 K/uL (ref 0.00–0.07)
Basophils Absolute: 0 K/uL (ref 0.0–0.1)
Basophils Relative: 1 %
Eosinophils Absolute: 0.1 K/uL (ref 0.0–0.5)
Eosinophils Relative: 3 %
HCT: 33.7 % — ABNORMAL LOW (ref 36.0–46.0)
Hemoglobin: 10.4 g/dL — ABNORMAL LOW (ref 12.0–15.0)
Immature Granulocytes: 0 %
Lymphocytes Relative: 33 %
Lymphs Abs: 1.4 K/uL (ref 0.7–4.0)
MCH: 27.7 pg (ref 26.0–34.0)
MCHC: 30.9 g/dL (ref 30.0–36.0)
MCV: 89.6 fL (ref 80.0–100.0)
Monocytes Absolute: 0.5 K/uL (ref 0.1–1.0)
Monocytes Relative: 11 %
Neutro Abs: 2.3 K/uL (ref 1.7–7.7)
Neutrophils Relative %: 52 %
Platelets: 188 K/uL (ref 150–400)
RBC: 3.76 MIL/uL — ABNORMAL LOW (ref 3.87–5.11)
RDW: 13.9 % (ref 11.5–15.5)
WBC: 4.4 K/uL (ref 4.0–10.5)
nRBC: 0 % (ref 0.0–0.2)

## 2024-07-01 LAB — CK: Total CK: 83 U/L (ref 38–234)

## 2024-07-01 LAB — BASIC METABOLIC PANEL WITH GFR
Anion gap: 10 (ref 5–15)
BUN: 20 mg/dL (ref 8–23)
CO2: 26 mmol/L (ref 22–32)
Calcium: 9.1 mg/dL (ref 8.9–10.3)
Chloride: 104 mmol/L (ref 98–111)
Creatinine, Ser: 1.13 mg/dL — ABNORMAL HIGH (ref 0.44–1.00)
GFR, Estimated: 47 mL/min — ABNORMAL LOW (ref >60)
Glucose, Bld: 98 mg/dL (ref 70–99)
Potassium: 4.2 mmol/L (ref 3.5–5.1)
Sodium: 140 mmol/L (ref 135–145)

## 2024-07-01 MED ORDER — OXYCODONE-ACETAMINOPHEN 5-325 MG PO TABS
1.0000 | ORAL_TABLET | Freq: Once | ORAL | Status: AC
  Administered 2024-07-01: 1 via ORAL
  Filled 2024-07-01: qty 1

## 2024-07-01 NOTE — ED Provider Notes (Signed)
 [place] EMERGENCY DEPARTMENT AT [facility] Provider Note   CSN: [id number] Arrival date & time: 07/01/24  2123     Patient presents with: Leg Pain   [name] is a 89 y.o. female.  {Add pertinent medical, surgical, social history, OB history to HPI:6380} 89 year old female presents with complaint of pain in both of her legs.  Pain with sudden onset earlier this evening while she was sitting on the sofa, described as from her hips to her toes, severe in nature.  Patient attempted to walk around to alleviate her pain unsuccessfully.  She denies recent falls or injuries, any back pain or abdominal pain.  Reports history of stents in her legs.  No other complaints or concerns today.  Pain at this time has notably eased.       Prior to Admission medications   Medication Sig Start Date End Date Taking? Authorizing Provider  acetaminophen  (TYLENOL ) 650 MG CR tablet Take 650 mg by mouth daily as needed for pain.    [provider]  AMBULATORY NON FORMULARY MEDICATION Medication Name: Diltiazem 2% gel -Using your index finger, you should apply a small amount of medication inside the rectum up to your first knuckle/joint three times daily x 6-8 weeks. 04/18/24   Beather Delon Gibson, PA  benzonatate (TESSALON) 100 MG capsule Take 100 mg by mouth 3 (three) times daily.    [provider]  carvedilol  (COREG ) 3.125 MG tablet Take 1 tablet (3.125 mg total) by mouth 2 (two) times daily with a meal. 03/14/24   Danford, Lonni SQUIBB, MD  Cholecalciferol  (VITAMIN D3) 10 MCG (400 UNIT) tablet Take 400 Units by mouth daily.    [provider]  clopidogrel  (PLAVIX ) 75 MG tablet Take 75 mg by mouth at bedtime.    [provider]  denosumab (PROLIA) 60 MG/ML SOSY injection Inject 60 mg into the skin every 6 (six) months. Patient not taking: Reported on 06/11/2024    [provider]  ezetimibe  (ZETIA ) 10 MG tablet Take 10 mg by mouth at bedtime.     [provider]  folic acid  (FOLVITE ) 1 MG tablet Take 1 mg by mouth daily. 08/21/18   [provider]  gabapentin  (NEURONTIN ) 400 MG capsule Take 400 mg by mouth 2 (two) times daily. Patient taking differently: Take 400 mg by mouth at bedtime. 09/07/22   [provider]  glimepiride  (AMARYL ) 2 MG tablet Take 2 mg by mouth daily with breakfast. 01/07/22   [provider]  hydrALAZINE  (APRESOLINE ) 25 MG tablet Take 1 tablet (25 mg total) by mouth every 8 (eight) hours. 03/14/24   Danford, Lonni SQUIBB, MD  isosorbide  mononitrate (IMDUR ) 60 MG 24 hr tablet Take 1 tablet (60 mg total) by mouth daily. 03/15/24   Danford, Lonni SQUIBB, MD  LORazepam  (ATIVAN ) 0.5 MG tablet 1 tablet Orally Once a day as needed for anxiety for 90 days 10/04/23   [provider]  methotrexate  (RHEUMATREX) 2.5 MG tablet Take 20 mg by mouth every Tuesday. Take 8 tablets by mouth every Friday  Caution:Chemotherapy. Protect from light. Patient not taking: Reported on 06/11/2024    [provider]  Multiple Vitamin (MULTIVITAMIN) capsule Take 1 capsule by mouth daily.    [provider]  nitroGLYCERIN  (NITROSTAT ) 0.4 MG SL tablet PLACE 1 TABLET UNDER THE TONGUE EVERY 5 MINUTES AS NEEDED FOR CHEST PAIN. 12/30/23   Court Dorn PARAS, MD  ondansetron  (ZOFRAN -ODT) 8 MG disintegrating tablet Take 1 tablet (8 mg  total) by mouth every 8 (eight) hours as needed for nausea. 04/18/24   Beather Delon Gibson, PA  OVER THE COUNTER MEDICATION Take 1 tablet by mouth daily at 6 (six) AM. Patient not taking: Reported on 06/11/2024    [provider]  pseudoephedrine-guaifenesin (MUCINEX D) 60-600 MG 12 hr tablet Take 1 tablet by mouth every 12 (twelve) hours.    [provider]  rosuvastatin  (CRESTOR ) 20 MG tablet Take 20 mg by mouth daily. 11/11/23   [provider]    Allergies: Fish allergy, Iodinated contrast media, Latex, Shellfish allergy, Amlodipine ,  Evolocumab, Other, and Metformin    Review of Systems Negative except as per HPI Updated Vital Signs BP (!) 166/78 (BP Location: Right Arm)   Pulse 75   Temp 98.1 F (36.7 C) (Oral)   Resp 16   Ht 5' 3 (1.6 m)   Wt 65 kg   SpO2 96%   BMI 25.38 kg/m   Physical Exam Vitals and nursing note reviewed.  Constitutional:      General: She is not in acute distress.    Appearance: She is well-developed. She is not diaphoretic.  HENT:     Head: Normocephalic and atraumatic.  Cardiovascular:     Comments: DP pulses present by doppler and marked  Pulmonary:     Effort: Pulmonary effort is normal.  Musculoskeletal:        General: No tenderness.     Right lower leg: Edema present.     Left lower leg: Edema present.  Skin:    General: Skin is warm and dry.     Findings: No bruising or erythema.  Neurological:     Mental Status: She is alert and oriented to person, place, and time.  Psychiatric:        Behavior: Behavior normal.     (all labs ordered are listed, but only abnormal results are displayed) Labs Reviewed  CBC WITH DIFFERENTIAL/PLATELET  BASIC METABOLIC PANEL WITH GFR  CK    EKG: None  Radiology: No results found.  {Document cardiac monitor, telemetry assessment procedure when appropriate:32947} Procedures   Medications Ordered in the ED  oxyCODONE -acetaminophen  (PERCOCET/ROXICET) 5-325 MG per tablet 1 tablet (has no administration in time range)      {Click here for ABCD2, HEART and other calculators REFRESH Note before signing:1}                              Medical Decision Making  ***  {Document critical care time when appropriate  Document review of labs and clinical decision tools ie CHADS2VASC2, etc  Document your independent review of radiology images and any outside records  Document your discussion with family members, caretakers and with consultants  Document social determinants of health affecting pt's care  Document your decision  making why or why not admission, treatments were needed:32947:::1}   Final diagnoses:  None    ED Discharge Orders     None

## 2024-07-01 NOTE — ED Triage Notes (Signed)
 PT arrived via [facility] c/o bilateral leg pain 10/10 that began suddenly. PT hx of CABG with upcoming appointment and was worried that the leg pain may be indicative of her heart. Pedal pulses present bilaterally.

## 2024-07-02 ENCOUNTER — Ambulatory Visit: Admitting: Pharmacist Clinician (PhC)/ Clinical Pharmacy Specialist

## 2024-07-02 MED ORDER — HYDROCODONE-ACETAMINOPHEN 5-325 MG PO TABS: 1.0000 | ORAL_TABLET | Freq: Four times a day (QID) | ORAL | 0 refills | Status: AC | PRN

## 2024-07-02 MED ORDER — ACETAMINOPHEN 325 MG PO TABS
650.0000 mg | ORAL_TABLET | Freq: Once | ORAL | Status: AC
  Administered 2024-07-02: 650 mg via ORAL
  Filled 2024-07-02: qty 2

## 2024-07-02 NOTE — Discharge Instructions (Addendum)
 Follow up with your primary care provider. Return to ER for worsening or concerning symptoms.  At home, limit salt intake, elevate legs to help with swelling. You can use compression hose, apply in the morning prior to getting out of bed.   Only take your pain medication for pain not controlled with Tylenol  alone and ONLY if you have family with you due to increased risk of falls with narcotic pain medications.

## 2024-07-02 NOTE — Progress Notes (Deleted)
 Office Visit    Patient Name: [name] Date of Encounter: 07/02/2024  Primary Care Provider:  Gerome Brunet, DO Primary Cardiologist:  Alm Clay, MD  Chief Complaint    Hyperlipidemia   Significant Past Medical History   CAD 1998 CABG x 3, 2017 NSTEMI - 55% stenosis of ostial SVG to D1  HTN Labile, adjusts hydralazine  to prevent swings  PAD 6/14 s/p atherectomy/ PTA of distal right SFA and DES to peroneal artery , 10/14 - intervention of left popliteal and tibioperoneal trunk disease   DM2 3/25 A1c 8.8 on glimepiride         Allergies  Allergen Reactions   Fish Allergy Hives and Shortness Of Breath   Iodinated Contrast Media Shortness Of Breath and Anaphylaxis    Other reaction(s): Unknown   Latex Anaphylaxis, Hives, Shortness Of Breath, Itching and Other (See Comments)    REACTION: wheezing Other reaction(s): Unknown   Shellfish Allergy Anaphylaxis, Hives, Shortness Of Breath and Other (See Comments)    All seafood   Amlodipine  Swelling   Evolocumab Other (See Comments)    Other reaction(s): latex on syringe Unknown reaction   Other Itching and Other (See Comments)    Patient reports allergy to perfumed detergents   Metformin Nausea And Vomiting    History of Present Illness    [name] is a 89 y.o. female patient of Dr Clay, in the office today to discuss options for cholesterol management.  Latex allergy  Insurance Carrier: [employer] (employer retirement plan)  Pharmacy:     [facility]:      LDL Cholesterol goal:  LDL < 55  Current Medications: rosuvastatin  20, ezetimibe  10    Previously tried:    Family Hx:     Social Hx: Tobacco: Alcohol :      Diet:      Exercise:   Adherence Assessment  Do you ever forget to take your medication? [] Yes [] No  Do you ever skip doses due to side effects? [] Yes [] No  Do you have trouble affording your medicines? [] Yes [] No  Are you ever unable to pick up your medication due to  transportation difficulties? [] Yes [] No  Do you ever stop taking your medications because you don't believe they are helping? [] Yes [] No  Do you check your weight daily? [] Yes [] No   Adherence strategy: ***  Barriers to obtaining medications: ***     Accessory Clinical Findings   Lab Results  Component Value Date   CHOL 190 03/11/2024   HDL 46 03/11/2024   LDLCALC 129 (H) 03/11/2024   TRIG 77 03/11/2024   CHOLHDL 4.1 03/11/2024    No results found for: LIPOA  Lab Results  Component Value Date   ALT 14 05/05/2023   AST 18 05/05/2023   ALKPHOS 30 (L) 05/05/2023   BILITOT 0.6 05/05/2023   Lab Results  Component Value Date   CREATININE 1.13 (H) 07/01/2024   BUN 20 07/01/2024   NA 140 07/01/2024   K 4.2 07/01/2024   CL 104 07/01/2024   CO2 26 07/01/2024   Lab Results  Component Value Date   HGBA1C 8.8 (H) 03/11/2024    Home Medications    Current Outpatient Medications  Medication Sig Dispense Refill   acetaminophen  (TYLENOL ) 650 MG CR tablet Take 650 mg by mouth daily as needed for pain.     AMBULATORY NON FORMULARY MEDICATION Medication Name: Diltiazem 2% gel -Using your index finger, you should apply a small amount of medication inside the  rectum up to your first knuckle/joint three times daily x 6-8 weeks. 45 g 1   benzonatate (TESSALON) 100 MG capsule Take 100 mg by mouth 3 (three) times daily.     carvedilol  (COREG ) 3.125 MG tablet Take 1 tablet (3.125 mg total) by mouth 2 (two) times daily with a meal. 180 tablet 0   Cholecalciferol  (VITAMIN D3) 10 MCG (400 UNIT) tablet Take 400 Units by mouth daily.     clopidogrel  (PLAVIX ) 75 MG tablet Take 75 mg by mouth at bedtime.     denosumab (PROLIA) 60 MG/ML SOSY injection Inject 60 mg into the skin every 6 (six) months. (Patient not taking: Reported on 06/11/2024)     ezetimibe  (ZETIA ) 10 MG tablet Take 10 mg by mouth at bedtime.     folic acid  (FOLVITE ) 1 MG tablet Take 1 mg by mouth daily.  1   gabapentin   (NEURONTIN ) 400 MG capsule Take 400 mg by mouth 2 (two) times daily. (Patient taking differently: Take 400 mg by mouth at bedtime.)     glimepiride  (AMARYL ) 2 MG tablet Take 2 mg by mouth daily with breakfast.     hydrALAZINE  (APRESOLINE ) 25 MG tablet Take 1 tablet (25 mg total) by mouth every 8 (eight) hours. 270 tablet 0   HYDROcodone -acetaminophen  (NORCO/VICODIN) 5-325 MG tablet Take 1 tablet by mouth every 6 (six) hours as needed for severe pain (pain score 7-10). 3 tablet 0   isosorbide  mononitrate (IMDUR ) 60 MG 24 hr tablet Take 1 tablet (60 mg total) by mouth daily. 90 tablet 0   LORazepam  (ATIVAN ) 0.5 MG tablet 1 tablet Orally Once a day as needed for anxiety for 90 days     methotrexate  (RHEUMATREX) 2.5 MG tablet Take 20 mg by mouth every Tuesday. Take 8 tablets by mouth every Friday  Caution:Chemotherapy. Protect from light. (Patient not taking: Reported on 06/11/2024)     Multiple Vitamin (MULTIVITAMIN) capsule Take 1 capsule by mouth daily.     nitroGLYCERIN  (NITROSTAT ) 0.4 MG SL tablet PLACE 1 TABLET UNDER THE TONGUE EVERY 5 MINUTES AS NEEDED FOR CHEST PAIN. 450 tablet 1   ondansetron  (ZOFRAN -ODT) 8 MG disintegrating tablet Take 1 tablet (8 mg total) by mouth every 8 (eight) hours as needed for nausea. 20 tablet 1   OVER THE COUNTER MEDICATION Take 1 tablet by mouth daily at 6 (six) AM. (Patient not taking: Reported on 06/11/2024)     pseudoephedrine-guaifenesin (MUCINEX D) 60-600 MG 12 hr tablet Take 1 tablet by mouth every 12 (twelve) hours.     rosuvastatin  (CRESTOR ) 20 MG tablet Take 20 mg by mouth daily.     No current facility-administered medications for this visit.     Assessment & Plan    No problem-specific Assessment & Plan notes found for this encounter.   Cartha Rotert, PharmD CPP [facility] [address]   [place], [place] [address] [phone]  07/02/2024, 6:58 AM

## 2024-07-11 ENCOUNTER — Ambulatory Visit: Admitting: Pharmacist Clinician (PhC)/ Clinical Pharmacy Specialist

## 2024-07-12 ENCOUNTER — Encounter: Payer: Self-pay | Admitting: Podiatry

## 2024-07-12 ENCOUNTER — Ambulatory Visit: Admitting: Podiatry

## 2024-07-12 DIAGNOSIS — L608 Other nail disorders: Secondary | ICD-10-CM | POA: Diagnosis not present

## 2024-07-12 DIAGNOSIS — B351 Tinea unguium: Secondary | ICD-10-CM

## 2024-07-12 DIAGNOSIS — M79674 Pain in right toe(s): Secondary | ICD-10-CM | POA: Diagnosis not present

## 2024-07-12 DIAGNOSIS — E1151 Type 2 diabetes mellitus with diabetic peripheral angiopathy without gangrene: Secondary | ICD-10-CM | POA: Diagnosis not present

## 2024-07-12 DIAGNOSIS — M79675 Pain in left toe(s): Secondary | ICD-10-CM | POA: Diagnosis not present

## 2024-07-12 NOTE — Progress Notes (Signed)
 This patient returns to my office for at risk foot care.  This patient requires this care by a professional since this patient will be at risk due to having diabetes,  PAD  and coagulation defect.  Patient is taking plavix . Patient says she has painful long thick nails especially her big toenail right foot.   This patient is unable to cut nails herself since the patient cannot reach her nails.These nails are painful walking and wearing shoes.  This patient presents for at risk foot care today.   General Appearance  Alert, conversant and in no acute stress.  Vascular  Dorsalis pedis and posterior tibial  pulses are not  palpable  bilaterally. Absent digital hair  B/L.  Capillary return is within normal limits  bilaterally. Temperature is within normal limits  bilaterally. Venous stasis  B/l.  Neurologic  Senn-Weinstein monofilament wire test within normal limits  bilaterally. Muscle power within normal limits bilaterally.  Nails Thick disfigured discolored nails with subungual debris  from hallux to fifth toes bilaterally. No evidence of bacterial infection or drainage bilaterally.  Right pincer nail hallux.  Orthopedic  No limitations of motion  feet .  No crepitus or effusions noted.  No bony pathology or digital deformities noted.  Skin  normotropic skin with no porokeratosis noted bilaterally.  No signs of infections or ulcers noted.     Onychomycosis  Pain in right toes  Pain in left toes    Consent was obtained for treatment procedures.   Mechanical debridement of nails 1-5  bilaterally performed with a nail nipper.  Filed with dremel without incident.  Right hallux was anesthetized before treatment.   Return office visit    3 months                 Told patient to return for periodic foot care and evaluation due to potential at risk complications.   Cordella Bold DPM

## 2024-08-01 ENCOUNTER — Ambulatory Visit: Admitting: Cardiology

## 2024-08-01 NOTE — Progress Notes (Deleted)
 Cardiology Office Note:  .   Date:  08/01/2024  ID:  [name], DOB 09-16-1933, MRN [id number] PCP: Gerome Brunet, DO  [facility] Providers Cardiologist:  Alm Clay, MD PV Cardiologist:  Dorn Lesches, MD { Click to update primary MD,subspecialty MD or APP then REFRESH:1}    No chief complaint on file.   Patient Profile: [name]     [name] is a *** 89 y.o. female *** with a PMH notable for *** who presents here for *** at the request of Gerome Brunet, DO.  PMH: CAD (1998): CABG x 3 (LIMA LAD, SVG to OM, SVG to D1) Cardiac cath 2017 catheter: 55% ostial SVG D1 with widely patent LIMA to LAD and SVG to OM.  Medical management. Cardiac Stress PET PAD: s/p atherectomy/ PTA of distal right SFA and DES to peroneal artery in 05/2013 =>  by intervention of left popliteal and tibioperoneal trunk disease in 09/2013  Chronic celiac artery dissection noted on CTA evaluation.  Recommended medical management. HTN, HLD, DM-2 History of CVA GERD with Barrett's esophagus Chronic anemia     [name] was last seen on   She was most recently seen for follow-up on May 28, 2024 by Lorette Kapur, PA (accompanied by Aline Door, PA) as a 1 month follow-up after being seen by Rollo Louder, PA for hospital follow-up.  Labile BP.  Intermittent dizziness but no recurrent chest pain.  BP elevated in the office.  Noted dizziness while working with PT.  Suspect to be balance issues.  At this visit she reported atypical chest pain-achy 4/10 pain associate with coughing.  Not exertional.  Recent viral infection.  Did not sound anginal.  More related to URI.  Diffuse muscle aches.  Felt some potential near syncopal symptoms.  Reported that she felt sick and just wanted know what is going on.  No orthopnea PND.  No syncope.  No edema.  Had noted BP elevations up to 200 mg 3.  Better after taking as needed hydralazine .  LDL 129.  Patient did not want to increase PRN 20 mg Crestor  and 10  mg Zetia .. => No anginal symptoms; in the absence of angina, we discussed results of stress test and suggested avoiding cardiac catheterization.  Concern with history of anaphylactoid reaction took contrast in the past and also concern for worsening renal function as reasons to avoid invasive evaluation.  Well over an hour spent with the patient.  Subjective  Discussed the use of AI scribe software for clinical note transcription with the patient, who gave verbal consent to proceed.  History of Present Illness    Cardiovascular ROS: {roscv:310661}  ROS:  Review of Systems - {ros master:310782}    Objective    Studies Reviewed: [name]        Lab Results  Component Value Date   CHOL 190 03/11/2024   HDL 46 03/11/2024   LDLCALC 129 (H) 03/11/2024   TRIG 77 03/11/2024   CHOLHDL 4.1 03/11/2024   Lab Results  Component Value Date   NA 140 07/01/2024   K 4.2 07/01/2024   CREATININE 1.13 (H) 07/01/2024   GFRNONAA 47 (L) 07/01/2024   GLUCOSE 98 07/01/2024   ECHO: Normal LV size and function EF 65 to 70%.  No RWMA.  GR 1 DD.  Normal RV.  Moderate LA dilation.  Normal Mitral and Aortic Valves normal RAP (02/2024)  LPA Dopplers/ABIs: Right and Left LE show mixed plaque. ABI indicated moderate right and left  lower extremity disease.  (04/2024)  Cardiac Stress PET: Medium sized defect with moderate reduction in the apical and mid anterior and apical location that is reversible consistent with ischemia.  EF 63%.  This felt this may not be accurate in the setting of prior CABG.  Read as LOW RISK.  (05/2024)  Images of Cardiac Catheterization from 11/01/2016: Severe left main and upstream LAD/LCx disease with patent LIMA to LAD, SVG to OM with 55% ostial SVG-diagonal.   Risk Assessment/Calculations:     No BP recorded.  {Refresh Note OR Click here to enter BP  :1}***         Physical Exam:   VS:  There were no vitals taken for this visit.   Wt Readings from Last 3 Encounters:   07/01/24 143 lb 4.8 oz (65 kg)  05/24/24 143 lb 3.2 oz (65 kg)  04/27/24 145 lb 8 oz (66 kg)    GEN: Well nourished, well developed in no acute distress; *** NECK: No JVD; No carotid bruits CARDIAC: Normal S1, S2; RRR, no murmurs, rubs, gallops RESPIRATORY:  Clear to auscultation without rales, wheezing or rhonchi ; nonlabored, good air movement. ABDOMEN: Soft, non-tender, non-distended EXTREMITIES:  No edema; No deformity      ASSESSMENT AND PLAN: .    Problem List Items Addressed This Visit       Cardiology Problems   Chronic diastolic CHF (congestive heart failure), NYHA class 2 (HCC) - Primary (Chronic)   Coronary artery disease involving native coronary artery of native heart with angina pectoris (HCC) (Chronic)   CVA-STROKE (Chronic)   NSTEMI (non-ST elevated myocardial infarction) (HCC) (Chronic)   Orthostatic hypotension   PAD,severe calcified pop and tibial peroneal trunk disease bilateraly (Chronic)     Other   Bilateral leg edema (Chronic)    Assessment and Plan Assessment & Plan        {Are you ordering a CV Procedure (e.g. stress test, cath, DCCV, TEE, etc)?   Press F2        :[id number]}   Follow-Up: No follow-ups on file.  Total time spent: *** min spent with patient + *** min spent charting = *** min    Signed, Alm MICAEL Clay, MD, MS Alm Clay, M.D., M.S. Interventional Chartered certified accountant  Pager # [phone]

## 2024-08-16 ENCOUNTER — Other Ambulatory Visit: Payer: Self-pay | Admitting: Cardiovascular Disease

## 2024-08-16 ENCOUNTER — Encounter: Payer: Self-pay | Admitting: Pharmacist Clinician (PhC)/ Clinical Pharmacy Specialist

## 2024-08-16 ENCOUNTER — Ambulatory Visit: Attending: Cardiology | Admitting: Pharmacist Clinician (PhC)/ Clinical Pharmacy Specialist

## 2024-08-16 DIAGNOSIS — E785 Hyperlipidemia, unspecified: Secondary | ICD-10-CM

## 2024-08-16 DIAGNOSIS — E1169 Type 2 diabetes mellitus with other specified complication: Secondary | ICD-10-CM | POA: Diagnosis not present

## 2024-08-16 NOTE — Assessment & Plan Note (Signed)
 Assessment: Patient with ASCVD not at LDL goal of < 55 Most recent LDL 129 on 03/11/24 Has been compliant with high intensity statin/ezetimibe  : rosuvastatin  20, ezetimibe  10 Reviewed options for lowering LDL cholesterol, specifically PCSK-9 inhibitors.  Discussed mechanisms of action, dosing, side effects, potential decreases in LDL cholesterol and costs.  Also reviewed potential options for patient assistance.  Plan: Will repeat labs today as current labs are . 61 months old Patient agreeable to starting Repatha 140 mg q14d Repeat labs after:  3 months Lipid Liver function Will determine price and then see if patient needs [facility] grant.

## 2024-08-16 NOTE — Progress Notes (Signed)
 Office Visit    Patient Name: [name] Date of Encounter: 08/16/2024  Primary Care Provider:  Gerome Brunet, DO Primary Cardiologist:  Alm Clay, MD  Chief Complaint    Hyperlipidemia   Significant Past Medical History   CAD 1998 CABG x 3; 2017 NSTEMI  PAD 2014 atherectomy/PTA of distatl R SFA, DES to peroneal artery;   HTN Controlled on carvedilol , imdur , hydralazine   DM2 3/25 A1c 8.8, on glimepiride   CVA Noted in history in 2011 - patient cannot recall      Allergies  Allergen Reactions   Fish Allergy Hives and Shortness Of Breath   Iodinated Contrast Media Shortness Of Breath and Anaphylaxis    Other reaction(s): Unknown   Latex Anaphylaxis, Hives, Shortness Of Breath, Itching and Other (See Comments)    REACTION: wheezing Other reaction(s): Unknown   Shellfish Allergy Anaphylaxis, Hives, Shortness Of Breath and Other (See Comments)    All seafood   Amlodipine  Swelling   Evolocumab Other (See Comments)    Other reaction(s): latex on syringe Unknown reaction   Other Itching and Other (See Comments)    Patient reports allergy to perfumed detergents   Metformin Nausea And Vomiting    History of Present Illness    [name] is a 89 y.o. female patient of Dr Clay, in the office today to discuss options for cholesterol management.  Patient has latex allergy  Insurance Carrier: [employer] (retirement plan)  Pharmacy:   [facility]  [facility]:  maybe - will confirm price first  LDL Cholesterol goal:  LDL < 55  Current Medications:   rosuvastatin  20 mg daily, ezetimibe  10 mg daily  Previously tried: atorvastatin   Family Hx:  adopted, children without significant coronary disease  Social Hx: Tobacco:  no Alcohol :  no  Diet:  still cookes more at home. Only fried food is eggs, bakes meats, uses canned and frozen vegetables  Exercise: walks some, has been to PT for balanace, falls x 2  Accessory Clinical Findings   Lab Results   Component Value Date   CHOL 190 03/11/2024   HDL 46 03/11/2024   LDLCALC 129 (H) 03/11/2024   TRIG 77 03/11/2024   CHOLHDL 4.1 03/11/2024    No results found for: LIPOA  Lab Results  Component Value Date   ALT 14 05/05/2023   AST 18 05/05/2023   ALKPHOS 30 (L) 05/05/2023   BILITOT 0.6 05/05/2023   Lab Results  Component Value Date   CREATININE 1.13 (H) 07/01/2024   BUN 20 07/01/2024   NA 140 07/01/2024   K 4.2 07/01/2024   CL 104 07/01/2024   CO2 26 07/01/2024   Lab Results  Component Value Date   HGBA1C 8.8 (H) 03/11/2024    Home Medications    Current Outpatient Medications  Medication Sig Dispense Refill   acetaminophen  (TYLENOL ) 650 MG CR tablet Take 650 mg by mouth daily as needed for pain.     AMBULATORY NON FORMULARY MEDICATION Medication Name: Diltiazem 2% gel -Using your index finger, you should apply a small amount of medication inside the rectum up to your first knuckle/joint three times daily x 6-8 weeks. 45 g 1   benzonatate (TESSALON) 100 MG capsule Take 100 mg by mouth 3 (three) times daily.     carvedilol  (COREG ) 3.125 MG tablet Take 1 tablet (3.125 mg total) by mouth 2 (two) times daily with a meal. 180 tablet 0   Cholecalciferol  (VITAMIN D3) 10 MCG (400 UNIT) tablet Take 400  Units by mouth daily.     clopidogrel  (PLAVIX ) 75 MG tablet Take 75 mg by mouth at bedtime.     denosumab (PROLIA) 60 MG/ML SOSY injection Inject 60 mg into the skin every 6 (six) months.     ezetimibe  (ZETIA ) 10 MG tablet Take 10 mg by mouth at bedtime.     folic acid  (FOLVITE ) 1 MG tablet Take 1 mg by mouth daily.  1   gabapentin  (NEURONTIN ) 400 MG capsule Take 400 mg by mouth 2 (two) times daily. (Patient taking differently: Take 400 mg by mouth at bedtime.)     glimepiride  (AMARYL ) 2 MG tablet Take 2 mg by mouth daily with breakfast.     hydrALAZINE  (APRESOLINE ) 25 MG tablet Take 1 tablet (25 mg total) by mouth every 8 (eight) hours. 270 tablet 0   HYDROcodone -acetaminophen   (NORCO/VICODIN) 5-325 MG tablet Take 1 tablet by mouth every 6 (six) hours as needed for severe pain (pain score 7-10). 3 tablet 0   isosorbide  mononitrate (IMDUR ) 60 MG 24 hr tablet Take 1 tablet (60 mg total) by mouth daily. 90 tablet 0   LORazepam  (ATIVAN ) 0.5 MG tablet 1 tablet Orally Once a day as needed for anxiety for 90 days     methotrexate  (RHEUMATREX) 2.5 MG tablet Take 20 mg by mouth every Tuesday. Take 8 tablets by mouth every Friday  Caution:Chemotherapy. Protect from light.     Multiple Vitamin (MULTIVITAMIN) capsule Take 1 capsule by mouth daily.     nitroGLYCERIN  (NITROSTAT ) 0.4 MG SL tablet PLACE 1 TABLET UNDER THE TONGUE EVERY 5 MINUTES AS NEEDED FOR CHEST PAIN. 450 tablet 1   ondansetron  (ZOFRAN -ODT) 8 MG disintegrating tablet Take 1 tablet (8 mg total) by mouth every 8 (eight) hours as needed for nausea. 20 tablet 1   OVER THE COUNTER MEDICATION Take 1 tablet by mouth daily at 6 (six) AM.     pseudoephedrine-guaifenesin (MUCINEX D) 60-600 MG 12 hr tablet Take 1 tablet by mouth every 12 (twelve) hours.     rosuvastatin  (CRESTOR ) 20 MG tablet Take 20 mg by mouth daily.     No current facility-administered medications for this visit.     Assessment & Plan    Hyperlipidemia associated with type 2 diabetes mellitus (HCC) Assessment: Patient with ASCVD not at LDL goal of < 55 Most recent LDL 129 on 03/11/24 Has been compliant with high intensity statin/ezetimibe  : rosuvastatin  20, ezetimibe  10 Reviewed options for lowering LDL cholesterol, specifically PCSK-9 inhibitors.  Discussed mechanisms of action, dosing, side effects, potential decreases in LDL cholesterol and costs.  Also reviewed potential options for patient assistance.  Plan: Will repeat labs today as current labs are . 16 months old Patient agreeable to starting Repatha 140 mg q14d Repeat labs after:  3 months Lipid Liver function Will determine price and then see if patient needs [facility] grant.   Kavari Parrillo, PharmD CPP [facility] [address]   [place], [place] [address] [phone]  08/16/2024, 10:15 AM

## 2024-08-16 NOTE — Patient Instructions (Signed)
 Your Results:             Your most recent labs Goal  Total Cholesterol 190 < 200  Triglycerides 77 < 150  HDL (happy/good cholesterol) 46 > 40  LDL (lousy/bad cholesterol 129 < 55   Medication changes:  We will start the process to get Repatha covered by your insurance.  Once the prior authorization is complete, I will call/send a MyChart message to let you know and confirm pharmacy information.   You will take one injection every 14 days  Take it out of the refrigerator about 20-30 minutes before you use the shot.  Lab orders:  Get cholesterol labs this morning.  Last labs were over 6 months ago We want to repeat labs after 2-3 months.  We will send you a lab order to remind you once we get closer to that time.    If the Repatha is too expensive, please let us  know, we have grants that can help pay your costs.   Thank you for choosing [facility]  Allean Mink PharmD

## 2024-08-17 LAB — HEPATIC FUNCTION PANEL
ALT: 19 IU/L (ref 0–32)
AST: 23 IU/L (ref 0–40)
Albumin: 4.1 g/dL (ref 3.7–4.7)
Alkaline Phosphatase: 38 IU/L — ABNORMAL LOW (ref 44–121)
Bilirubin Total: 0.4 mg/dL (ref 0.0–1.2)
Bilirubin, Direct: 0.12 mg/dL (ref 0.00–0.40)
Total Protein: 7.1 g/dL (ref 6.0–8.5)

## 2024-08-17 LAB — LIPID PANEL
Chol/HDL Ratio: 2.9 ratio (ref 0.0–4.4)
Cholesterol, Total: 188 mg/dL (ref 100–199)
HDL: 65 mg/dL (ref >39)
LDL Chol Calc (NIH): 110 mg/dL — ABNORMAL HIGH (ref 0–99)
Triglycerides: 71 mg/dL (ref 0–149)
VLDL Cholesterol Cal: 13 mg/dL (ref 5–40)

## 2024-08-20 ENCOUNTER — Telehealth: Payer: Self-pay | Admitting: Pharmacist Clinician (PhC)/ Clinical Pharmacy Specialist

## 2024-08-20 ENCOUNTER — Other Ambulatory Visit (HOSPITAL_COMMUNITY): Payer: Self-pay

## 2024-08-20 ENCOUNTER — Telehealth: Payer: Self-pay | Admitting: Pharmacy Technician

## 2024-08-20 ENCOUNTER — Ambulatory Visit: Payer: Self-pay | Admitting: Pharmacist Clinician (PhC)/ Clinical Pharmacy Specialist

## 2024-08-20 NOTE — Telephone Encounter (Signed)
 Please do PA for Repatha

## 2024-08-20 NOTE — Telephone Encounter (Signed)
 Per pt calls   Ran test claim for repatha . For a 28 day supply and the co-pay is 24.99 . PA is not needed at this time. Nothing saying this is a transition fill.   This test claim was processed through [facility]- copay amounts may vary at other pharmacies due to pharmacy/plan contracts, or as the patient moves through the different stages of their insurance plan.

## 2024-08-21 MED ORDER — REPATHA SURECLICK 140 MG/ML ~~LOC~~ SOAJ: 140.0000 mg | SUBCUTANEOUS | 3 refills | Status: AC

## 2024-08-21 NOTE — Telephone Encounter (Signed)
 Patient aware medication approved, will repeat labs in 3-4 months

## 2024-09-02 NOTE — Progress Notes (Unsigned)
 Cardiology Office Note:  .   Date:  09/03/2024  ID:  [name], DOB 09/27/1933, MRN [id number] PCP: Monica Brunet, DO  [facility] Providers Cardiologist:  Monica Clay, MD Cardiology APP:  [name]  PV Cardiologist:  Monica Lesches, MD     Chief Complaint  Patient presents with   Follow-up    75-month   Coronary Artery Disease    History of CAD-CABG with abnormal but low risk stress PET test and intermittent episodes of angina.    Patient Profile: Monica     [name] is a 89 y.o. female with a PMH reviewed below who presents here for 4-month follow-up at the request of Monica Brunet, DO.  PMH: CAD (1992): LAD PCI CABG x3 (1998): LIMA to LAD, SVG to OM, and SVG to D1  NSTEMI (10/2016):  Ost SVG-D1 55%, otw patent grafts. = Med Rx. (Myoview  01/2022 - Non-Ischemic)-unable to use left radial artery due to tortuosity PAD: ->  S/p Atherectomy and PTA of distal R SFA and stent placed in the Right Peroneal Artery.  (05/15/2013) Referred to Dr. Zachary Flores for L Pop & TPT PTA (October 2014) Noted to have chronic left celiac   I have not seen [name] since May 2021  She was admitted on 3/29-03/15/19/2025 with chest pain thought related to hypertensive emergency.  Negative cardiac evaluation.  CTA of the abdomen pelvis revealed an incidental finding of Chronic Left Iliac Artery Dissection => medical management recommended by vascular surgery. => Cardiology consult in the hospital suggested outpatient cardiac stress PET. => She saw Monica Louder, PA for hospital follow-up at [facility].  She still has labile hypertension and dizziness but no recurrent chest pain.  Blood pressure 154/78.  I suspected that the dizziness is probably more related balance.  This suggested continuing carvedilol  3.25 mg twice daily and Imdur  60 [name] twice daily but adjust hydralazine  dose based on systolic blood pressure(if SBP < 150 mmHg - take 25 mg; if SBP> 150 mmHg-take 50 mg).  They  discussed her upcoming stress PET.     [name] was last seen on 05/24/2024 by Monica Kapur, PA for follow-up to discuss results of her Cardiac Stress PET.  She still described a 4/10 achy chest pain that was associated cough and nonexertional.  Nonpositional.  Symptom improvement present since she was diagnosed with a viral URI by in the urgent care a week or so before.  Also noted muscle aches and pains in the arms for 2 years-indicated that this was similar to her pre-CABG symptoms.  She also described diffuse muscle aches all over for about 2 years.  She also felt some near syncopal episodes of treatment and so she may blackout at rest.  This was also felt to be chronic.  She said that she is feel sick and just wants to know what is going on.  No dyspnea or dizziness, no edema, PND orthopnea.  No syncope or near syncope.  BP is more controlled with only 3-4 episodes of blood pressure with systolic over 200 requiring elevated. --> They discussed the results of the stress PET and also her symptoms.  I reviewed the stress pattern felt that the location of her ischemia is unlikely as this would be indicative of LIMA disease.  We the plan was that we would hold off on invasive evaluation unless she were actively have anginal symptoms.  She was not actively having chest pain.  A prolonged discussion was  had and I discussed that in the absence of active anginal symptoms, invasive procedure would not Nestl warranted since the test was read as low risk.  We discussed concern of her celiac artery dissection.  It is discussed months himself and get back with us .  Card catheterization was placed on hold  She was just seen in the Flores by [name], [name] on 08/16/2024 to discuss options of cholesterol management.  She was on rosuvastatin  20 mg daily and Zetia  10 mg daily.  Intolerant of atorvastatin .  Not at goal on rosuvastatin  20 mg and ezetimibe  10 mg--LDL 110 on  08/16/2024).  Not able to tolerate higher doses.  Suggested starting on Repatha   Subjective  Discussed the use of [name] for clinical note transcription with the patient, who gave verbal consent to proceed.  History of Present Illness [name] is an 89 year old female with coronary artery disease and hypertension who presents with chest discomfort and high blood pressure. She is accompanied by her daughter, [name].  She experiences intermittent chest tightness or pressure and shortness of breath, particularly during activities such as house cleaning or grocery shopping. These symptoms occur about once a week and are relieved by resting. She occasionally uses nitroglycerin  for relief, which she has used once leading to a hospital visit in March due to high blood pressure.  She has a history of coronary artery disease, having undergone bypass surgery in 1998 and a heart catheterization in 2017, which showed her grafts were patent. A recent stress test indicated decreased blood flow in the front wall of the heart. Her blood pressure is often high, with readings sometimes reaching 170 mmHg. She takes hydralazine  25 mg three times a day and takes an extra dose if her blood pressure exceeds 150 mmHg. She monitors her blood pressure regularly and notes that it usually returns to normal after taking the extra dose.  She also takes isosorbide  and has been started on Repatha  injections every 14 days to manage her cholesterol levels, as her previous medication, Crestor , was not sufficient.  She experiences significant leg pain, described as a 'toothache' sensation, which occurs both at rest and with walking, limiting her mobility more than her chest pain. A Doppler study indicated worse circulation in the right leg compared to the left. She wears support socks and elevates her legs to manage swelling, and she has been prescribed furosemide  as needed for persistent swelling.  She reports waking up  in the middle of the night due to difficulty breathing, occurring three to four times a week. She adjusts her pillows or sits up to alleviate this symptom.  She is on Plavix  without aspirin .  Cardiovascular ROS: positive for - chest pain, dyspnea on exertion, irregular heartbeat, orthopnea, and there is no real consistency with symptoms; she seems to have bilateral leg pain worse with walking concerning for claudication.  This symptom actually is more limiting to her than dyspnea.. negative for - loss of consciousness, paroxysmal nocturnal dyspnea, rapid heart rate, shortness of breath, or syncope or near syncope, TIA or emesis regards.  .  ROS:  Review of Systems - Negative except symptoms noted above    Objective   Current Meds  Medication Sig   acetaminophen  (TYLENOL ) 650 MG CR tablet Take 650 mg by mouth daily as needed for pain.   AMBULATORY NON FORMULARY MEDICATION Medication Name: Diltiazem 2% gel -Using your index finger, you should apply a small amount  of medication inside the rectum up to your first knuckle/joint three times daily x 6-8 weeks.   carvedilol  (COREG ) 6.25 MG tablet Take 1 tablet (6.25 mg total) by mouth 2 (two) times daily.   Cholecalciferol  (VITAMIN D3) 10 MCG (400 UNIT) tablet Take 400 Units by mouth daily.   clopidogrel  (PLAVIX ) 75 MG tablet Take 75 mg by mouth at bedtime.   denosumab (PROLIA) 60 MG/ML SOSY injection Inject 60 mg into the skin every 6 (six) months.   Evolocumab  (REPATHA  SURECLICK) 140 MG/ML SOAJ Inject 140 mg into the skin every 14 (fourteen) days.   ezetimibe  (ZETIA ) 10 MG tablet Take 10 mg by mouth at bedtime.   folic acid  (FOLVITE ) 1 MG tablet Take 1 mg by mouth daily.   furosemide  (LASIX ) 20 MG tablet Take 1 tablet (20 mg total) by mouth as needed. Up to 4 times a week   gabapentin  (NEURONTIN ) 400 MG capsule Take 400 mg by mouth 2 (two) times daily. (Patient taking differently: Take 400 mg by mouth at bedtime.)   glimepiride  (AMARYL ) 2 MG  tablet Take 2 mg by mouth daily with breakfast.   HYDROcodone -acetaminophen  (NORCO/VICODIN) 5-325 MG tablet Take 1 tablet by mouth every 6 (six) hours as needed for severe pain (pain score 7-10).   isosorbide  mononitrate (IMDUR ) 60 MG 24 hr tablet Take 1 tablet (60 mg total) by mouth daily.   LORazepam  (ATIVAN ) 0.5 MG tablet 1 tablet Orally Once a day as needed for anxiety for 90 days   methotrexate  (RHEUMATREX) 2.5 MG tablet Take 20 mg by mouth every Tuesday. Take 8 tablets by mouth every Friday  Caution:Chemotherapy. Protect from light.   Multiple Vitamin (MULTIVITAMIN) capsule Take 1 capsule by mouth daily.   nitroGLYCERIN  (NITROSTAT ) 0.4 MG SL tablet PLACE 1 TABLET UNDER THE TONGUE EVERY 5 MINUTES AS NEEDED FOR CHEST PAIN.   ondansetron  (ZOFRAN -ODT) 8 MG disintegrating tablet Take 1 tablet (8 mg total) by mouth every 8 (eight) hours as needed for nausea.   OVER THE COUNTER MEDICATION Take 1 tablet by mouth daily at 6 (six) AM.   rosuvastatin  (CRESTOR ) 20 MG tablet Take 20 mg by mouth daily.   [DISCONTINUED] carvedilol  (COREG ) 3.125 MG tablet Take 1 tablet (3.125 mg total) by mouth 2 (two) times daily with a meal.    Social Hx: Tobacco:  no; Alcohol :  no   Diet:  still cookes more at home. Only fried food is eggs, bakes meats, uses canned and frozen vegetables   Exercise: walks some, has been to PT for balanace, falls x 2  Studies Reviewed: Monica        Lab Results  Component Value Date   CHOL 188 08/16/2024   HDL 65 08/16/2024   LDLCALC 110 (H) 08/16/2024   TRIG 71 08/16/2024   CHOLHDL 2.9 08/16/2024  --> Plan to start Repatha   Lab Results  Component Value Date   NA 140 07/01/2024   K 4.2 07/01/2024   CREATININE 1.13 (H) 07/01/2024   GFRNONAA 47 (L) 07/01/2024   GLUCOSE 98 07/01/2024   Lab Results  Component Value Date   WBC 4.4 07/01/2024   HGB 10.4 (L) 07/01/2024   HCT 33.7 (L) 07/01/2024   MCV 89.6 07/01/2024   PLT 188 07/01/2024    Cardiac Stress PET (05/2024):  Medium sized-moderate reduced uptake in the apical to mid anterior wall consistent with ischemia.  EF 64%.  Also noted chronic volume loss of the right lower lobe of the lung. Cardiac cath 11/01/2016: Severe  distal left main ostial and proximal LAD occlusion and severe ostial and proximal LCx occlusion.  55% ostial SVG-diagonal but otherwise widely patent LIMA to LAD and SVG-LCx.   ECHO (02/2024): EF estimated 65 to 70%.  Normal function with normal wall motion.  No RWMA.  G1 DD.  Moderate LA dilation.  Normal RV size and function. CTA Chest/Abdomen/Pelvis with and without contrast (03/11/2024): No evidence of aortic dissection or aneurysm.  Focal Dissection of the Celiac Axis at its origin with resultant hemodynamically significant stenosis of the origin (~60-70%).  Widely patent distally.  50-75% stenosis of the origin of the IMA With Widely Patent Distally.  Recommended monitoring for signs of mesenteric ischemia).  Moderate sigmoid diverticulosis with superimposed acute inflammatory changes.  Mesenteric Vascular Ultrasound (04/27/2024):Normal Hepatic artery and Splenic artery findings. 70 to 99% stenosis in the celiac artery and superior mesenteric artery. The Inferior Mesenteric artery appears stenotic.  LEA Dopplers (05/04/2024): RIGHT-mixed plaque throughout.  Patent femoral arteries with 30 to 49% stenosis in the CFA and PFA.  Occluded AK popliteal artery with distal popliteal reconstruction, occluded PTA and ATA arteries at the distal branch.  Patent peroneal artery stent were visualized with monophasic flow throughout.  LEFT-mixed plaque.  Patent Pham-pop arteries with 50 to 74% stenosis of PFA,m patent AK popliteal-TPT stent with no significant restenosis.  Occluded peroneal artery distal.  Patent PTA and ATA with distal normal waveforms.  Risk Assessment/Calculations:          Physical Exam:   VS:  BP 130/80 (BP Location: Left Arm, Patient Position: Sitting)   Pulse 94   Ht 5' 3 (1.6 m)    Wt 144 lb 12.8 oz (65.7 kg)   SpO2 96%   BMI 25.65 kg/m    Wt Readings from Last 3 Encounters:  09/03/24 144 lb 12.8 oz (65.7 kg)  07/01/24 143 lb 4.8 oz (65 kg)  05/24/24 143 lb 3.2 oz (65 kg)      GEN: Well nourished, well groomed in no acute distress; healthy-appearing NECK: No JVD; No carotid bruits CARDIAC: Normal S1, S2; RRR, no murmurs, rubs, gallops RESPIRATORY:  Clear to auscultation without rales, wheezing or rhonchi ; nonlabored, good air movement. ABDOMEN: Soft, non-tender, non-distended EXTREMITIES: Trivial ankle edema with bilateral venous stasis changes-brawny discoloration.; No deformity; decreased radial and pedal pulses bilaterally     ASSESSMENT AND PLAN: .    Problem List Items Addressed This Visit       Cardiology Problems   Angina, class I (HCC) (Chronic)   Intermittent exertional angina, manageable with medication.  Recent stress PET was read as LOW RISK but with anterior ischemia that would suggest potential progression of the SVG to diagonal versus LIMA to LAD.   We had a long discussion about the risks benefits alternatives indications of proceeding with cardiac catheterization versus optimizing medical management.  At this point I think with a shared decision-making conversation, the best course of action is to try to avoid invasive procedures due to risk-benefit concerns with advanced age, occlusive left radial artery and PAD.  Especially in light of the fact that she is more limited by what sounds like claudication then angina, I am not sure the commander benefit for PCI.   The plan will be to titrate antianginals.  She is currently on low-dose carvedilol  plus Isordil  mononitrate 60 mg daily with hydralazine  for afterload reduction and somewhat labile blood pressures - Increase carvedilol  to 6.25 mg twice daily, starting with nighttime dose, then morning dose after  a week if tolerated. - Continue isosorbide  mononitrate 60 mg daily along with hydralazine  25  mg every 8 hours for afterload reduction along with additional 25 mg as needed sustained SBP > 160 mmHg.      Relevant Medications   carvedilol  (COREG ) 6.25 MG tablet   hydrALAZINE  (APRESOLINE ) 25 MG tablet   furosemide  (LASIX ) 20 MG tablet   Chronic diastolic CHF (congestive heart failure), NYHA class 2 (HCC) (Chronic)   Ice to do some of her symptoms are related to diastolic heart failure.  She does have chronic exertional dyspnea with occasional orthopnea She only has minimal edema which I think is probably more related to venous stasis.  Symptoms manageable with medication. Emphasized medication management over invasive procedures unless symptoms worsen. She is on hydralazine  25 mg 3 times daily plus Imdur  60 mL daily for afterload reduction and we are increasing carvedilol  to 6.25 mg twice daily for additional blood pressure control. - Increase carvedilol  to 6.25 mg twice daily, starting with nighttime dose, then morning dose after a week if tolerated. - Continue isosorbide  mononitrate 60 mg daily along with hydralazine  25 mg 3 times daily. - Consider adding Ranexa if symptoms persist.        Relevant Medications   carvedilol  (COREG ) 6.25 MG tablet   hydrALAZINE  (APRESOLINE ) 25 MG tablet   furosemide  (LASIX ) 20 MG tablet   Coronary artery disease involving native coronary artery of native heart with angina pectoris (HCC) - Primary (Chronic)   Distant history of CABG with most recent cath in November 2017 showing patent grafts. Low risk but abnormal stress PET suggesting anterior ischemia but class I anginal symptoms.   Plan is to try to avoid invasive options unless symptoms warrant.  She is currently more limited by claudication and angina. - Continue standard dose of Plavix  75 mg daily with history of PAD and CAD - Continue combination of Zetia  10 mg daily and rosuvastatin  20 mg daily for now, but once she starts Repatha , we can do a 1 month statin holiday to reassess  symptoms -Continue hydralazine  25 mg 3 times daily along with Imdur  60 mg daily for afterload reduction/antianginal benefit and increase carvedilol  to 6.25 mg twice daily  - If SBP were to stay sustained>160 mmHg after 1 hour, okay to take additional dose of hydralazine  however if SBP is <105 mmHg, would hold the upcoming dose of hydralazine . Antianginal medications include carvedilol  and Imdur -we are increasing carvedilol  to 6.25 mg daily. - Consider adding Ranexa if symptoms persist.  If symptoms become intolerable, we can consider cardiac catheterization, but would try to avoid it.      Relevant Medications   carvedilol  (COREG ) 6.25 MG tablet   hydrALAZINE  (APRESOLINE ) 25 MG tablet   furosemide  (LASIX ) 20 MG tablet   Other Relevant Orders   For home use only DME Continuous passive motion machine   Hyperlipidemia associated with type 2 diabetes mellitus (HCC) (Chronic)   Cholesterol management with Repatha  due to coronary artery disease. Plan to reassess statin use after achieving cholesterol goals. - Continue Repatha  injections every two weeks. - After third dose of Repatha , hold rosuvastatin  for one month to assess impact on leg pain. - Work with pharmacy to explore cost assistance for Repatha .  She is on Amaryl  2 mg daily along with insulin  for diabetes. Consider SGLT2 in the but will defer to PCP.      Relevant Medications   carvedilol  (COREG ) 6.25 MG tablet   hydrALAZINE  (APRESOLINE ) 25 MG tablet  furosemide  (LASIX ) 20 MG tablet   Other Relevant Orders   For home use only DME Continuous passive motion machine   Labile hypertension (Chronic)   Blood pressure generally well-controlled with hydralazine , occasional spikes noted. Avoid low blood pressure to prevent falls. - Increase carvedilol  to 6.25 mg twice daily titrating gradually with starting at the nighttime dose and then increase in the daytime dose after 1 week - Continue hydralazine  25 mg three times daily in  conjunction with Imdur  60 mg daily. - Take an extra dose of hydralazine  if blood pressure remains above 160 mmHg after two hours. - Avoid hydralazine  if blood pressure is <105 mmHg.      Relevant Medications   carvedilol  (COREG ) 6.25 MG tablet   hydrALAZINE  (APRESOLINE ) 25 MG tablet   furosemide  (LASIX ) 20 MG tablet   Other Relevant Orders   For home use only DME Continuous passive motion machine   NSTEMI (non-ST elevated myocardial infarction) (HCC) (Chronic)   8 years out from MRI.  Not having that type of anginal pain.  Cardiac catheterization revealed native vessel disease and patent grafts.  Recommendation was medical therapy.      Relevant Medications   carvedilol  (COREG ) 6.25 MG tablet   hydrALAZINE  (APRESOLINE ) 25 MG tablet   furosemide  (LASIX ) 20 MG tablet   Other Relevant Orders   For home use only DME Continuous passive motion machine   PAD,severe calcified pop and tibial peroneal trunk disease bilateraly (Chronic)   Lower extremity Dopplers do show significant lower extremity disease and she does have claudication symptoms which seems to be more prominent than any anginal symptoms. Likely not a good candidate for further procedures per Dr. Court.  In the absence of critical limb ischemia, would continue to titrate medications for GDMT.  Severe leg pain limits mobility.  Continue to recommend walking to improve collateral flow.      Relevant Medications   carvedilol  (COREG ) 6.25 MG tablet   hydrALAZINE  (APRESOLINE ) 25 MG tablet   furosemide  (LASIX ) 20 MG tablet   Other Relevant Orders   For home use only DME Continuous passive motion machine   Venous stasis dermatitis of both lower extremities (Chronic)   Venous stasis with lower extremity edema and skin changes Chronic edema partially relieved by leg elevation and compression. Discussed alternative compression options. - Prescribe sequential compression devices (SCDs) for use 30 minutes to 1 hour, a couple of times a  day, and at least 1 hour at night. - Prescribe furosemide  20 mg as needed for swelling, up to four times a week. - Avoid wearing compression stockings unless standing for long periods.      Relevant Medications   carvedilol  (COREG ) 6.25 MG tablet   hydrALAZINE  (APRESOLINE ) 25 MG tablet   furosemide  (LASIX ) 20 MG tablet   Other Relevant Orders   For home use only DME Continuous passive motion machine     Other   Hx of CABG (Chronic)   Relevant Orders   For home use only DME Continuous passive motion machine   Myalgia due to statin (Chronic)   Severe leg pain limits mobility.   Plan to assess statin contribution to pain by discontinuing rosuvastatin  after Repatha  goals achieved. - After third dose of Repatha , hold rosuvastatin  for one month to assess impact on leg pain. - If leg pain improves, resume rosuvastatin  at half a tablet every other day. - Consider reducing rosuvastatin  dose if cholesterol levels are controlled with Repatha .  Follow-Up: Return in about 4 months (around 01/03/2025) for 3-4 month follow-up, Follow-up with APP, Alternate 3 months with APP, 6 month with MD.    I spent 72 minutes in the care of [name] today including reviewing labs (2 minutes), reviewing studies (echo, CTA, previous cath, stress PET and previous Myoview  is all reviewed-7 minutes), face to face time discussing treatment options (46 minutes including detailed discussion with the patient's daughter), reviewing records from recent APP and Dr. Court visits (6 minutes), 11 minutes dictating, and documenting in the encounter.      Signed, Monica MICAEL Clay, MD, MS [name], M.D., M.S. Interventional Cardiologist  [facility] Pager # [phone]

## 2024-09-03 ENCOUNTER — Encounter: Payer: Self-pay | Admitting: Cardiology

## 2024-09-03 ENCOUNTER — Ambulatory Visit: Attending: Cardiology | Admitting: Cardiology

## 2024-09-03 VITALS — BP 130/80 | HR 94 | Ht 63.0 in | Wt 144.8 lb

## 2024-09-03 DIAGNOSIS — Z951 Presence of aortocoronary bypass graft: Secondary | ICD-10-CM

## 2024-09-03 DIAGNOSIS — I739 Peripheral vascular disease, unspecified: Secondary | ICD-10-CM

## 2024-09-03 DIAGNOSIS — R0989 Other specified symptoms and signs involving the circulatory and respiratory systems: Secondary | ICD-10-CM | POA: Diagnosis not present

## 2024-09-03 DIAGNOSIS — I872 Venous insufficiency (chronic) (peripheral): Secondary | ICD-10-CM

## 2024-09-03 DIAGNOSIS — I214 Non-ST elevation (NSTEMI) myocardial infarction: Secondary | ICD-10-CM | POA: Diagnosis not present

## 2024-09-03 DIAGNOSIS — I209 Angina pectoris, unspecified: Secondary | ICD-10-CM

## 2024-09-03 DIAGNOSIS — R600 Localized edema: Secondary | ICD-10-CM

## 2024-09-03 DIAGNOSIS — T466X5D Adverse effect of antihyperlipidemic and antiarteriosclerotic drugs, subsequent encounter: Secondary | ICD-10-CM

## 2024-09-03 DIAGNOSIS — E1169 Type 2 diabetes mellitus with other specified complication: Secondary | ICD-10-CM

## 2024-09-03 DIAGNOSIS — M791 Myalgia, unspecified site: Secondary | ICD-10-CM

## 2024-09-03 DIAGNOSIS — I25119 Atherosclerotic heart disease of native coronary artery with unspecified angina pectoris: Secondary | ICD-10-CM

## 2024-09-03 DIAGNOSIS — I5032 Chronic diastolic (congestive) heart failure: Secondary | ICD-10-CM

## 2024-09-03 DIAGNOSIS — E785 Hyperlipidemia, unspecified: Secondary | ICD-10-CM

## 2024-09-03 DIAGNOSIS — T466X5A Adverse effect of antihyperlipidemic and antiarteriosclerotic drugs, initial encounter: Secondary | ICD-10-CM | POA: Insufficient documentation

## 2024-09-03 MED ORDER — FUROSEMIDE 20 MG PO TABS
20.0000 mg | ORAL_TABLET | ORAL | 3 refills | Status: AC | PRN
Start: 2024-09-03 — End: 2024-12-02

## 2024-09-03 MED ORDER — CARVEDILOL 6.25 MG PO TABS: 6.2500 mg | ORAL_TABLET | Freq: Two times a day (BID) | ORAL | 3 refills | Status: AC

## 2024-09-03 MED ORDER — HYDRALAZINE HCL 25 MG PO TABS: 25.0000 mg | ORAL_TABLET | Freq: Three times a day (TID) | ORAL | 2 refills | Status: AC

## 2024-09-03 NOTE — Assessment & Plan Note (Signed)
 Cholesterol management with Repatha  due to coronary artery disease. Plan to reassess statin use after achieving cholesterol goals. - Continue Repatha  injections every two weeks. - After third dose of Repatha , hold rosuvastatin  for one month to assess impact on leg pain. - Work with pharmacy to explore cost assistance for Repatha .  She is on Amaryl  2 mg daily along with insulin  for diabetes. Consider SGLT2 in the but will defer to PCP.

## 2024-09-03 NOTE — Assessment & Plan Note (Signed)
 Severe leg pain limits mobility.   Plan to assess statin contribution to pain by discontinuing rosuvastatin  after Repatha  goals achieved. - After third dose of Repatha , hold rosuvastatin  for one month to assess impact on leg pain. - If leg pain improves, resume rosuvastatin  at half a tablet every other day. - Consider reducing rosuvastatin  dose if cholesterol levels are controlled with Repatha .

## 2024-09-03 NOTE — Assessment & Plan Note (Addendum)
 Distant history of CABG with most recent cath in November 2017 showing patent grafts. Low risk but abnormal stress PET suggesting anterior ischemia but class I anginal symptoms.   Plan is to try to avoid invasive options unless symptoms warrant.  She is currently more limited by claudication and angina. - Continue standard dose of Plavix  75 mg daily with history of PAD and CAD - Continue combination of Zetia  10 mg daily and rosuvastatin  20 mg daily for now, but once she starts Repatha , we can do a 1 month statin holiday to reassess symptoms -Continue hydralazine  25 mg 3 times daily along with Imdur  60 mg daily for afterload reduction/antianginal benefit and increase carvedilol  to 6.25 mg twice daily  - If SBP were to stay sustained>160 mmHg after 1 hour, okay to take additional dose of hydralazine  however if SBP is <105 mmHg, would hold the upcoming dose of hydralazine . Antianginal medications include carvedilol  and Imdur -we are increasing carvedilol  to 6.25 mg daily. - Consider adding Ranexa if symptoms persist.  If symptoms become intolerable, we can consider cardiac catheterization, but would try to avoid it.

## 2024-09-03 NOTE — Assessment & Plan Note (Signed)
 Ice to do some of her symptoms are related to diastolic heart failure.  She does have chronic exertional dyspnea with occasional orthopnea She only has minimal edema which I think is probably more related to venous stasis.  Symptoms manageable with medication. Emphasized medication management over invasive procedures unless symptoms worsen. She is on hydralazine  25 mg 3 times daily plus Imdur  60 mL daily for afterload reduction and we are increasing carvedilol  to 6.25 mg twice daily for additional blood pressure control. - Increase carvedilol  to 6.25 mg twice daily, starting with nighttime dose, then morning dose after a week if tolerated. - Continue isosorbide  mononitrate 60 mg daily along with hydralazine  25 mg 3 times daily. - Consider adding Ranexa if symptoms persist.

## 2024-09-03 NOTE — Assessment & Plan Note (Signed)
 Intermittent exertional angina, manageable with medication.  Recent stress PET was read as LOW RISK but with anterior ischemia that would suggest potential progression of the SVG to diagonal versus LIMA to LAD.   We had a long discussion about the risks benefits alternatives indications of proceeding with cardiac catheterization versus optimizing medical management.  At this point I think with a shared decision-making conversation, the best course of action is to try to avoid invasive procedures due to risk-benefit concerns with advanced age, occlusive left radial artery and PAD.  Especially in light of the fact that she is more limited by what sounds like claudication then angina, I am not sure the commander benefit for PCI.   The plan will be to titrate antianginals.  She is currently on low-dose carvedilol  plus Isordil  mononitrate 60 mg daily with hydralazine  for afterload reduction and somewhat labile blood pressures - Increase carvedilol  to 6.25 mg twice daily, starting with nighttime dose, then morning dose after a week if tolerated. - Continue isosorbide  mononitrate 60 mg daily along with hydralazine  25 mg every 8 hours for afterload reduction along with additional 25 mg as needed sustained SBP > 160 mmHg.

## 2024-09-03 NOTE — Assessment & Plan Note (Signed)
 Blood pressure generally well-controlled with hydralazine , occasional spikes noted. Avoid low blood pressure to prevent falls. - Increase carvedilol  to 6.25 mg twice daily titrating gradually with starting at the nighttime dose and then increase in the daytime dose after 1 week - Continue hydralazine  25 mg three times daily in conjunction with Imdur  60 mg daily. - Take an extra dose of hydralazine  if blood pressure remains above 160 mmHg after two hours. - Avoid hydralazine  if blood pressure is <105 mmHg.

## 2024-09-03 NOTE — Assessment & Plan Note (Addendum)
 Lower extremity Dopplers do show significant lower extremity disease and she does have claudication symptoms which seems to be more prominent than any anginal symptoms. Likely not a good candidate for further procedures per Dr. Court.  In the absence of critical limb ischemia, would continue to titrate medications for GDMT.  Severe leg pain limits mobility.  Continue to recommend walking to improve collateral flow.

## 2024-09-03 NOTE — Patient Instructions (Addendum)
 Medication Instructions:  1) After you take your 3rd dose of Repatha , You can stop taking the Rosuvastatin  for one month,if symptoms does not go away  it is not the Rosuvastatin   restart the medication , but if symptoms get better or lessen restart the Rosuvastatin  at 10 mg  ( 1/2 tablet of 20 mg) every other day   2) Increase carvedilol   to 3.25 mg  in the morning  and 6.25 mg ( 2 tablets )  in the evening.  After 2 weeks if you r are not  dizzy , swimmy head or dizzy. Increase morning dose to 6.25 mg. Contact office to have prescription change.   3)  Lasix  ( furosemide  ) 20 mg  may take up to 4 times a week for increase swelling as needed.  4)   Continue taking Hydralazine  52 mg three times a day  , if blood pressure is elevated 160 or higher , recheck your blood pressure after 2 hours if still elevated take an extra 25 mg of Hydralazine   but if blood pressure is 110 or less do not take the dose of Hydralazine .   *If you need a refill on your cardiac medications before your next appointment, please call your pharmacy*   Lab Work: Not needed    Testing/Procedures:  Not needed  Follow-Up: At [facility], you and your health needs are our priority.  As part of our continuing mission to provide you with exceptional heart care, we have created designated Provider Care Teams.  These Care Teams include your primary Cardiologist (physician) and Advanced Practice Providers (APPs -  Physician Assistants and Nurse Practitioners) who all work together to provide you with the care you need, when you need it.     Your next appointment:   4 month(s)  The format for your next appointment:   In Person  Provider:   Aline Door, PA-C or Kathleen Johnson, PA-C      Then, Alm Clay, MD will plan to see you again in 8 month(s).   Other Instructions  For leg swelling :   Recommend you purchase  - compression  mobility  machine. Use at least 1/2 hour to 1 hour  2 to 3 times a day and 1  hour prior to going to bed.

## 2024-09-03 NOTE — Assessment & Plan Note (Signed)
 8 years out from MRI.  Not having that type of anginal pain.  Cardiac catheterization revealed native vessel disease and patent grafts.  Recommendation was medical therapy.

## 2024-09-03 NOTE — Assessment & Plan Note (Signed)
 Venous stasis with lower extremity edema and skin changes Chronic edema partially relieved by leg elevation and compression. Discussed alternative compression options. - Prescribe sequential compression devices (SCDs) for use 30 minutes to 1 hour, a couple of times a day, and at least 1 hour at night. - Prescribe furosemide  20 mg as needed for swelling, up to four times a week. - Avoid wearing compression stockings unless standing for long periods.

## 2024-10-08 ENCOUNTER — Telehealth: Payer: Self-pay | Admitting: Cardiology

## 2024-10-08 NOTE — Telephone Encounter (Signed)
 Attempted to call pt, unable to reach. LVM stating that a CPM machine question should be directed to her orthopedic provider or PCP but if the message was regarding something other than the CPM machine to call our office back.

## 2024-10-08 NOTE — Telephone Encounter (Signed)
 Pt would like a c/b regarding CPM machine. Please advise

## 2024-10-11 ENCOUNTER — Ambulatory Visit (INDEPENDENT_AMBULATORY_CARE_PROVIDER_SITE_OTHER): Admitting: Podiatry

## 2024-10-11 ENCOUNTER — Encounter: Payer: Self-pay | Admitting: Podiatry

## 2024-10-11 DIAGNOSIS — M79675 Pain in left toe(s): Secondary | ICD-10-CM

## 2024-10-11 DIAGNOSIS — L608 Other nail disorders: Secondary | ICD-10-CM

## 2024-10-11 DIAGNOSIS — M79674 Pain in right toe(s): Secondary | ICD-10-CM | POA: Diagnosis not present

## 2024-10-11 DIAGNOSIS — E1151 Type 2 diabetes mellitus with diabetic peripheral angiopathy without gangrene: Secondary | ICD-10-CM | POA: Diagnosis not present

## 2024-10-11 DIAGNOSIS — B351 Tinea unguium: Secondary | ICD-10-CM | POA: Diagnosis not present

## 2024-10-11 NOTE — Progress Notes (Signed)
 This patient returns to my office for at risk foot care.  This patient requires this care by a professional since this patient will be at risk due to having diabetes,  PAD  and coagulation defect.  Patient is taking plavix . Patient says she has painful long thick nails especially her big toenail right foot.   This patient is unable to cut nails herself since the patient cannot reach her nails.These nails are painful walking and wearing shoes.  This patient presents for at risk foot care today.   General Appearance  Alert, conversant and in no acute stress.  Vascular  Dorsalis pedis and posterior tibial  pulses are not  palpable  bilaterally. Absent digital hair  B/L.  Capillary return is within normal limits  bilaterally. Temperature is within normal limits  bilaterally. Venous stasis  B/l.  Neurologic  Senn-Weinstein monofilament wire test within normal limits  bilaterally. Muscle power within normal limits bilaterally.  Nails Thick disfigured discolored nails with subungual debris  from hallux to fifth toes bilaterally. No evidence of bacterial infection or drainage bilaterally.  Right pincer nail hallux.  Orthopedic  No limitations of motion  feet .  No crepitus or effusions noted.  No bony pathology or digital deformities noted.  Skin  normotropic skin with no porokeratosis noted bilaterally.  No signs of infections or ulcers noted.     Onychomycosis  Pain in right toes  Pain in left toes    Consent was obtained for treatment procedures.   Mechanical debridement of nails 1-5  bilaterally performed with a nail nipper.  Filed with dremel without incident.  Right hallux was anesthetized before treatment.   Return office visit    3 months                 Told patient to return for periodic foot care and evaluation due to potential at risk complications.   Cordella Bold DPM

## 2024-11-02 ENCOUNTER — Ambulatory Visit

## 2024-11-02 ENCOUNTER — Encounter (HOSPITAL_COMMUNITY)

## 2024-11-13 ENCOUNTER — Other Ambulatory Visit: Payer: Self-pay | Admitting: Family Medicine

## 2024-11-13 DIAGNOSIS — N644 Mastodynia: Secondary | ICD-10-CM

## 2024-11-28 ENCOUNTER — Telehealth: Payer: Self-pay | Admitting: Pharmacist Clinician (PhC)/ Clinical Pharmacy Specialist

## 2024-11-28 ENCOUNTER — Other Ambulatory Visit: Payer: Self-pay | Admitting: Pharmacist Clinician (PhC)/ Clinical Pharmacy Specialist

## 2024-11-28 DIAGNOSIS — E1169 Type 2 diabetes mellitus with other specified complication: Secondary | ICD-10-CM

## 2024-11-29 NOTE — Telephone Encounter (Signed)
 error

## 2024-11-30 ENCOUNTER — Ambulatory Visit

## 2024-11-30 ENCOUNTER — Ambulatory Visit (HOSPITAL_COMMUNITY)
Admission: RE | Admit: 2024-11-30 | Discharge: 2024-11-30 | Disposition: A | Discharge status: Home / Self Care | Source: Ambulatory Visit | Attending: Physician Assistant | Admitting: Physician Assistant

## 2024-11-30 VITALS — BP 197/99 | HR 80 | Temp 97.7°F | Wt 142.8 lb

## 2024-11-30 DIAGNOSIS — K551 Chronic vascular disorders of intestine: Secondary | ICD-10-CM

## 2024-11-30 NOTE — Progress Notes (Signed)
 " Office Note     CC:  follow up Requesting Provider:  Gerome Brunet, DO  HPI: [name] is a 89 y.o. (11-20-1933) female who presents for surveillance of mesenteric artery stenosis.  She presented to the emergency department at the end of March of this year with hypertensive urgency and chest pain.  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  This was deemed to be asymptomatic.  She returns to our office for reevaluation of mesenteric duplex.  She continues to be without postprandial pain and food fear.  She has had slow weight loss over the past several years however this is due to intentional dietary changes.  She is followed for PAD by Dr. Court.  She is on Plavix  daily.   Past Medical History:  Diagnosis Date   Allergy to IVP dye    Angina, class I 04/22/2015   Atherosclerotic heart disease native coronary artery w/angina pectoris 1992   a. 1992 s/p PTCA of LAD;  b. 1998 CABG x 3; c. 07/2001 Cath: Sev LM/LAD/LCX dzs, 3/3 patent grafts; d. 11/2013 Cath: stable graft anatomy; e. 10/2016 Cath: native 3VD, VG->OM nl, LIMA->LAD nl, VG->Diag 55.   Atherosclerotic heart disease of native coronary artery with angina pectoris 01/14/2010   Qualifier: Diagnosis of  By: Debrah MD, Lamar BIRCH    Atrial septal aneurysm    Barrett's esophagus 03/04/2010   Qualifier: Diagnosis of  By: Ever PA-c, Amy S    Bilateral leg edema 04/22/2015   Bradycardia, mild briefly to the 40s, asymptomatic 05/16/2013   Carotid arterial disease    a. 05/2014 Carotid U/S: No signif bilat ICA stenosis, >60% L ECA.   Chronic anemia    Chronic diastolic CHF (congestive heart failure) (HCC)    a. 10/2016 Echo: Ef 55-60%, no rwma, Gr1 DD, triv AI, PASP , atrial septal aneurysm.   Chronic diastolic CHF (congestive heart failure), NYHA class 2 (HCC) 08/11/2017   Claudication 05/16/2013   Peripheral arterial occlusive disease  with lifestyle limiting claudication    Degenerative joint disease 05/30/2018   DIVERTICULOSIS OF COLON 01/14/2010   Qualifier: Diagnosis of  By: Debrah MD, Lamar BIRCH    DM (diabetes mellitus), type 2 with peripheral vascular complications (HCC)    On Oral medications.   DOE (dyspnea on exertion) 04/22/2015   Essential hypertension    FECAL INCONTINENCE 03/04/2010   Qualifier: Diagnosis of  By: Ever PA-c, Amy S    GERD (gastroesophageal reflux disease)    Hyperlipidemia with target LDL less than 70    Inflammatory arthritis 10/13/2017   Neuropathy    Non-insulin  dependent type 2 diabetes mellitus (HCC) 10/24/2015   NSTEMI (non-ST elevated myocardial infarction) (HCC) 11/01/2016   PAD (peripheral artery disease) 05/2013; 09/2013   a. severe calcified POP-1 & TP Trunk Dz bilat;  b . 05/2013 Diamondback Rot Athrectomy (R POP) --> PTA R Pop, DES to R Peroneal;  c. 09/2013 PTA/Stenting of L Pop OLINDA Clause - retrograde access);  d. 04/2014 ABI's: R/L: 1.1/1.1.   Preoperative cardiovascular examination 04/15/2020   PUD (peptic ulcer disease)    Rheumatoid arthritis (HCC)    S/P CABG x 3 1998   LIMA-LAD, SVG-OM, SVG-D1   Severe claudication 05/2013   Referred for Peripheral Angio (Dr. Court --> Dr. Zachary Clause in [place])   [facility] ALLERGY 01/14/2010   Qualifier: Diagnosis of  By: Marcelo CMA (AAMA), [name]  Status post total replacement of right hip 10/12/2018   Vertigo 01/02/2014    Past Surgical History:  Procedure Laterality Date   ATHERECTOMY Right 05/15/2013   Procedure: ATHERECTOMY;  Surgeon: Dorn JINNY Lesches, MD;  Location: [facility] CATH LAB;  Service: Cardiovascular;  Laterality: Right;  popliteal   BACK SURGERY     CARDIAC CATHETERIZATION  07/17/2001   2 v CAD with LM, circ, LAD, obtuse diag, mod stenosis of diag vein graft , mild stenosis of marg vein graft, nl EF, medical treatment   CARDIAC CATHETERIZATION  11/2013   Widely patent LIMA-LAD & SVG-OM with mild progression  of proximal stenosis ~40-50% in SVG-D1; Widely patent native RCA with known severe native LCA disease   CARDIAC CATHETERIZATION N/A 11/01/2016   Procedure: Left Heart Cath and Cors/Grafts Angiography;  Surgeon: Alm LELON Clay, MD;  Location: [facility] INVASIVE CV LAB;  Service: Cardiovascular: Hemodynamics: High LVEDP!.  Cors/Grafts: LM 55%, o-p LAD 100% then after D1 -> LIMA-LAD patent, SVG-Diag ~55%. small RI wiht ~70% ostial. O-p Cx 80% - pCx 70% --> SVG-bifurcating OM patent. -- med Rx   Cardiac Stress PET  06/01/2024   Medium sized-moderate reduced uptake in the apical to mid anterior wall consistent with ischemia.  EF 64%.  Also noted chronic volume loss of the right lower lobe of the lung.   CORONARY ANGIOPLASTY  1992   PCI to LAD   CORONARY ARTERY BYPASS GRAFT  1998   LIMA-LAD, SVG-diagonal (none roughly 50% stenosis), SVG-OM   LEFT HEART CATHETERIZATION WITH CORONARY ANGIOGRAM N/A 11/27/2013   Procedure: LEFT HEART CATHETERIZATION WITH CORONARY ANGIOGRAM;  Surgeon: Alm LELON Clay, MD;  Location: [facility] CATH LAB;  Service: Cardiovascular;  Laterality: N/A;   LOW EXTREMITY DOPPLERS/ABI  10/2016   Patent right SFA, popliteal and peroneal tendons. Patent left SFA and popliteal stent. 30-50% stenosis in the right common femoral and popliteal arteries, 50-74% stenosis in left profunda femoris artery. --> 1 year follow-up.  RABI - 1.2, LABI 1.1   LOWER EXTREMITY ANGIOGRAM N/A 02/22/2013   Procedure: LOWER EXTREMITY ANGIOGRAM;  Surgeon: Dorn JINNY Lesches, MD;  Location: [facility] CATH LAB;  Service: Cardiovascular;  Laterality: N/A;   LOWER EXTREMITY ANGIOGRAM N/A 07/20/2013   Procedure: LOWER EXTREMITY ANGIOGRAM;  Surgeon: Dorn JINNY Lesches, MD;  Location: [facility] CATH LAB;  Service: Cardiovascular;  Laterality: N/A;   NM MYOVIEW  LTD  04/03/2013; 5/26'/2016   Lexiscan : a)  Apical perfusion defect with no ischemia. Consider prior infarct versus breast attenuation.;; b) 5/'16: LOW RISK, Nl EF ~55-65%, No Ischemia  /Infarction   NM MYOVIEW  LTD  10/'19; 4/'21   a) LOW RISK.  EF 55%.  No ischemia or infarction. b) EF estimated 81%.  Suboptimal images.  May be subtle inferior ischemia-initially read by radiology as intermediate risk, however overread by cardiology to be mild low risk.[name]   PERCUTANEOUS STENT INTERVENTION Right 05/15/2013   Procedure: PERCUTANEOUS STENT INTERVENTION;  Surgeon: Dorn JINNY Lesches, MD;  Location: [facility] C[facility] LAB;  Service: Cardiovascular;  Laterality: Right;  tibioperoneal trunk   Peroneal Artery Stent  05/15/2013   Diamondback rot. atherectomy of high grade segmental popliteal stenosis then Chocolate balloonPTA; then angiosculpt PTA of the prox peroneal with stent-Xpedition    pv angio  07/20/2013   high-grade calcified popliteal disease with one-vessel runoff via a small anterior tibial artery that has proximal disease as well, no intervention   TOTAL HIP ARTHROPLASTY Right 10/12/2018   Procedure: RIGHT TOTAL HIP ARTHROPLASTY ANTERIOR APPROACH;  Surgeon: Vernetta Bruckner  Y, MD;  Location: [facility] ORS;  Service: Orthopedics;  Laterality: Right;   TOTAL HIP ARTHROPLASTY Left 11/21/2020   Procedure: LEFT TOTAL HIP ARTHROPLASTY ANTERIOR APPROACH;  Surgeon: Vernetta Lonni GRADE, MD;  Location: [facility] ORS;  Service: Orthopedics;  Laterality: Left;   TRANSTHORACIC ECHOCARDIOGRAM  02/2024   EF estimated 65 to 70%.  Normal function with normal wall motion.  No RWMA.  G1 DD.  Moderate LA dilation.  Normal RV size and function.   TRANSTHORACIC ECHOCARDIOGRAM  03/18/2020   EF 70-75%.  Hyperdynamic.  GR 1 DD.  No R WMA.  Mild LA dilation.  Mild aortic valve sclerosis but no stenosis.-No change from prior echo   TUBAL LIGATION      Social History   Socioeconomic History   Marital status: Married    Spouse name: Not on file   Number of children: Not on file   Years of education: Not on file   Highest education level: Not on file  Occupational History   Occupation: retired  Tobacco Use    Smoking status: Never   Smokeless tobacco: Never  Vaping Use   Vaping status: Never Used  Substance and Sexual Activity   Alcohol  use: Not Currently   Drug use: No   Sexual activity: Not on file  Other Topics Concern   Not on file  Social History Narrative   Right handed   Lives alone ( husband in rehab facility)   Caffeine- mostly coffee       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   Social Drivers of Health   Tobacco Use: Low Risk (11/30/2024)   Patient History    Smoking Tobacco Use: Never    Smokeless Tobacco Use: Never    Passive Exposure: Not on file  Financial Resource Strain: Not on file  Food Insecurity: No Food Insecurity (03/15/2024)   Hunger Vital Sign    Worried About Running Out of Food in the Last Year: Never true    Ran Out of Food in the Last Year: Never true  Transportation Needs: No Transportation Needs (03/15/2024)   PRAPARE - Administrator, Civil Service (Medical): No    Lack of Transportation (Non-Medical): No  Physical Activity: Not on file  Stress: Not on file  Social Connections: Socially Integrated (03/11/2024)   Social Connection and Isolation Panel    Frequency of Communication with Friends and Family: More than three times a week    Frequency of Social Gatherings with Friends and Family: Twice a week    Attends Religious Services: More than 4 times per year    Active Member of [employer] or Organizations: No    Attends Engineer, Structural: More than 4 times per year    Marital Status: Married  Catering Manager Violence: Not At Risk (03/15/2024)   Humiliation, Afraid, Rape, and Kick questionnaire    Fear of Current or Ex-Partner: No    Emotionally Abused: No    Physically Abused: No    Sexually Abused: No  Depression (PHQ2-9): Not on file  Alcohol  Screen: Not on file  Housing: Low Risk (03/15/2024)   Housing Stability Vital Sign    Unable to Pay for Housing in the Last Year: No    Number of Times Moved in the Last Year: 0    Homeless in the Last Year:  No  Utilities: Not At Risk (03/15/2024)   AHC Utilities    Threatened with loss of utilities: No  Health Literacy: Not on file  Family History  Adopted: Yes  Problem Relation Age of Onset   Hypertension Mother    Hyperlipidemia Mother    Hyperlipidemia Father    Hypertension Father     Current Outpatient Medications  Medication Sig Dispense Refill   acetaminophen  (TYLENOL ) 650 MG CR tablet Take 650 mg by mouth daily as needed for pain.     AMBULATORY NON FORMULARY MEDICATION Medication Name: Diltiazem 2% gel -Using your index finger, you should apply a small amount of medication inside the rectum up to your first knuckle/joint three times daily x 6-8 weeks. 45 g 1   benzonatate (TESSALON) 100 MG capsule Take 100 mg by mouth 3 (three) times daily.     carvedilol  (COREG ) 6.25 MG tablet Take 1 tablet (6.25 mg total) by mouth 2 (two) times daily. 180 tablet 3   Cholecalciferol  (VITAMIN D3) 10 MCG (400 UNIT) tablet Take 400 Units by mouth daily.     clopidogrel  (PLAVIX ) 75 MG tablet Take 75 mg by mouth at bedtime.     denosumab (PROLIA) 60 MG/ML SOSY injection Inject 60 mg into the skin every 6 (six) months.     Evolocumab  (REPATHA  SURECLICK) 140 MG/ML SOAJ Inject 140 mg into the skin every 14 (fourteen) days. 6 mL 3   ezetimibe  (ZETIA ) 10 MG tablet Take 10 mg by mouth at bedtime.     folic acid  (FOLVITE ) 1 MG tablet Take 1 mg by mouth daily.  1   furosemide  (LASIX ) 20 MG tablet Take 1 tablet (20 mg total) by mouth as needed. Up to 4 times a week 45 tablet 3   gabapentin  (NEURONTIN ) 400 MG capsule Take 400 mg by mouth 2 (two) times daily. (Patient taking differently: Take 400 mg by mouth at bedtime.)     glimepiride  (AMARYL ) 2 MG tablet Take 2 mg by mouth daily with breakfast.     hydrALAZINE  (APRESOLINE ) 25 MG tablet Take 1 tablet (25 mg total) by mouth every 8 (eight) hours. May hold  take an extra 25 mg as needed for systolic blood pressure is greater tthan160 , or if blood pressure is  110  hold  Hydralazine . 270 tablet 2   HYDROcodone -acetaminophen  (NORCO/VICODIN) 5-325 MG tablet Take 1 tablet by mouth every 6 (six) hours as needed for severe pain (pain score 7-10). 3 tablet 0   isosorbide  mononitrate (IMDUR ) 60 MG 24 hr tablet Take 1 tablet (60 mg total) by mouth daily. 90 tablet 2   LORazepam  (ATIVAN ) 0.5 MG tablet 1 tablet Orally Once a day as needed for anxiety for 90 days     methotrexate  (RHEUMATREX) 2.5 MG tablet Take 20 mg by mouth every Tuesday. Take 8 tablets by mouth every Friday  Caution:Chemotherapy. Protect from light.     Multiple Vitamin (MULTIVITAMIN) capsule Take 1 capsule by mouth daily.     nitroGLYCERIN  (NITROSTAT ) 0.4 MG SL tablet PLACE 1 TABLET UNDER THE TONGUE EVERY 5 MINUTES AS NEEDED FOR CHEST PAIN. 450 tablet 1   ondansetron  (ZOFRAN -ODT) 8 MG disintegrating tablet Take 1 tablet (8 mg total) by mouth every 8 (eight) hours as needed for nausea. 20 tablet 1   OVER THE COUNTER MEDICATION Take 1 tablet by mouth daily at 6 (six) AM.     pseudoephedrine-guaifenesin (MUCINEX D) 60-600 MG 12 hr tablet Take 1 tablet by mouth every 12 (twelve) hours.     rosuvastatin  (CRESTOR ) 20 MG tablet Take 20 mg by mouth daily.     No current facility-administered medications for this visit.  Allergies[1]   REVIEW OF SYSTEMS:  Negative unless noted in HPI [X]  denotes positive finding, [ ]  denotes negative finding Cardiac  Comments:  Chest pain or chest pressure:    Shortness of breath upon exertion:    Short of breath when lying flat:    Irregular heart rhythm:        Vascular    Pain in calf, thigh, or hip brought on by ambulation:    Pain in feet at night that wakes you up from your sleep:     Blood clot in your veins:    Leg swelling:         Pulmonary    Oxygen at home:    Productive cough:     Wheezing:         Neurologic    Sudden weakness in arms or legs:     Sudden numbness in arms or legs:     Sudden onset of difficulty speaking or slurred  speech:    Temporary loss of vision in one eye:     Problems with dizziness:         Gastrointestinal    Blood in stool:     Vomited blood:         Genitourinary    Burning when urinating:     Blood in urine:        Psychiatric    Major depression:         Hematologic    Bleeding problems:    Problems with blood clotting too easily:        Skin    Rashes or ulcers:        Constitutional    Fever or chills:      PHYSICAL EXAMINATION:  Vitals:   11/30/24 1051  BP: (!) 197/99  Pulse: 80  Temp: 97.7 F (36.5 C)  TempSrc: Temporal  Weight: 142 lb 12.8 oz (64.8 kg)    General:  WDWN in NAD; vital signs documented above Gait: Not observed HENT: WNL, normocephalic Pulmonary: normal non-labored breathing Cardiac: regular HR Abdomen: soft, NT, no masses Skin: without rashes Extremities: without ischemic changes, without Gangrene , without cellulitis; without open wounds;  Musculoskeletal: no muscle wasting or atrophy  Neurologic: A&O X 3 Psychiatric:  The pt has Normal affect.   Non-Invasive Vascular Imaging:   Elevated velocity of the celiac artery Elevated velocity of the SMA origin Elevated velocity at the IMA    ASSESSMENT/PLAN:: 89 y.o. female here for follow up for surveillance of mesenteric artery stenosis  Ms. Lesmeister is an 89 year old female who returns for surveillance of mesenteric artery stenosis.  Subjectively she continues to be without fever or postprandial pain.  She has had progressive slow weight loss over the years however this is due to intentional dietary changes.  Duplex demonstrates elevated velocities at the celiac, SMA, and IMA.  Findings were discussed with Dr. Pearline.  We would not intervene on asymptomatic mesenteric stenosis due to the operative risk.  We will repeat mesenteric duplex on an annual basis.  She knows to contact the office if she develops symptoms.  She can continue her Plavix  daily.  She will continue follow-up with Dr.  Court in regards to PAD.   Monica Sender, PA-C [facility] [phone]  Clinic MD:   Pearline     [1]  Allergies Allergen Reactions   Fish Allergy Hives and Shortness Of Breath   Iodinated Contrast Media Shortness Of Breath and Anaphylaxis  Other reaction(s): Unknown   Latex Anaphylaxis, Hives, Shortness Of Breath, Itching and Other (See Comments)    REACTION: wheezing Other reaction(s): Unknown   Shellfish Allergy Anaphylaxis, Hives, Shortness Of Breath and Other (See Comments)    All seafood   Amlodipine  Swelling   Evolocumab  Other (See Comments)    Other reaction(s): latex on syringe Unknown reaction   Other Itching and Other (See Comments)    Patient reports allergy to perfumed detergents   Metformin Nausea And Vomiting   "

## 2024-12-12 ENCOUNTER — Ambulatory Visit

## 2024-12-12 ENCOUNTER — Ambulatory Visit
Admission: RE | Admit: 2024-12-12 | Discharge: 2024-12-12 | Disposition: A | Discharge status: Home / Self Care | Source: Ambulatory Visit | Attending: Family Medicine | Admitting: Family Medicine

## 2024-12-12 DIAGNOSIS — N644 Mastodynia: Secondary | ICD-10-CM

## 2024-12-26 ENCOUNTER — Ambulatory Visit: Admitting: Student-PharmD

## 2025-01-04 ENCOUNTER — Ambulatory Visit: Admitting: Student

## 2025-01-09 ENCOUNTER — Ambulatory Visit: Admitting: Cardiology

## 2025-01-30 ENCOUNTER — Ambulatory Visit: Admitting: Emergency Medicine

## 2025-03-29 ENCOUNTER — Ambulatory Visit: Admitting: Cardiology
